# Patient Record
Sex: Female | Born: 1960
Health system: Southern US, Community
[De-identification: ages and names within clinical notes are randomized; demographics above are authoritative.]

## PROBLEM LIST (undated history)

## (undated) DIAGNOSIS — R112 Nausea with vomiting, unspecified: Secondary | ICD-10-CM

## (undated) DIAGNOSIS — H269 Unspecified cataract: Secondary | ICD-10-CM

## (undated) DIAGNOSIS — Z923 Personal history of irradiation: Secondary | ICD-10-CM

## (undated) DIAGNOSIS — I10 Essential (primary) hypertension: Secondary | ICD-10-CM

## (undated) DIAGNOSIS — K529 Noninfective gastroenteritis and colitis, unspecified: Secondary | ICD-10-CM

## (undated) DIAGNOSIS — M797 Fibromyalgia: Secondary | ICD-10-CM

## (undated) DIAGNOSIS — Z9889 Other specified postprocedural states: Secondary | ICD-10-CM

## (undated) DIAGNOSIS — D352 Benign neoplasm of pituitary gland: Secondary | ICD-10-CM

## (undated) DIAGNOSIS — F419 Anxiety disorder, unspecified: Secondary | ICD-10-CM

## (undated) DIAGNOSIS — D320 Benign neoplasm of cerebral meninges: Secondary | ICD-10-CM

## (undated) DIAGNOSIS — G9332 Myalgic encephalomyelitis/chronic fatigue syndrome: Secondary | ICD-10-CM

## (undated) DIAGNOSIS — K90829 Short bowel syndrome, unspecified: Secondary | ICD-10-CM

## (undated) DIAGNOSIS — E042 Nontoxic multinodular goiter: Secondary | ICD-10-CM

## (undated) DIAGNOSIS — G43909 Migraine, unspecified, not intractable, without status migrainosus: Secondary | ICD-10-CM

## (undated) DIAGNOSIS — C801 Malignant (primary) neoplasm, unspecified: Secondary | ICD-10-CM

## (undated) DIAGNOSIS — M1990 Unspecified osteoarthritis, unspecified site: Secondary | ICD-10-CM

## (undated) DIAGNOSIS — R5382 Chronic fatigue, unspecified: Secondary | ICD-10-CM

## (undated) DIAGNOSIS — T7840XA Allergy, unspecified, initial encounter: Secondary | ICD-10-CM

## (undated) DIAGNOSIS — E785 Hyperlipidemia, unspecified: Secondary | ICD-10-CM

## (undated) DIAGNOSIS — D689 Coagulation defect, unspecified: Secondary | ICD-10-CM

## (undated) DIAGNOSIS — F32A Depression, unspecified: Secondary | ICD-10-CM

## (undated) DIAGNOSIS — Z9289 Personal history of other medical treatment: Secondary | ICD-10-CM

## (undated) DIAGNOSIS — E119 Type 2 diabetes mellitus without complications: Secondary | ICD-10-CM

## (undated) DIAGNOSIS — R42 Dizziness and giddiness: Secondary | ICD-10-CM

## (undated) DIAGNOSIS — K219 Gastro-esophageal reflux disease without esophagitis: Secondary | ICD-10-CM

## (undated) DIAGNOSIS — M549 Dorsalgia, unspecified: Secondary | ICD-10-CM

## (undated) DIAGNOSIS — K754 Autoimmune hepatitis: Secondary | ICD-10-CM

## (undated) HISTORY — PX: FRACTURE SURGERY: SHX138

## (undated) HISTORY — DX: Anxiety disorder, unspecified: F41.9

## (undated) HISTORY — PX: TONGUE BIOPSY: SHX1075

## (undated) HISTORY — PX: ARTHROPLASTY: SHX135

## (undated) HISTORY — DX: Hyperlipidemia, unspecified: E78.5

## (undated) HISTORY — PX: EYE SURGERY: SHX253

## (undated) HISTORY — PX: ABDOMINAL HYSTERECTOMY: SHX81

## (undated) HISTORY — PX: SPINAL FUSION: SHX223

## (undated) HISTORY — PX: COLON SURGERY: SHX602

## (undated) HISTORY — PX: KNEE ARTHROSCOPY: SUR90

## (undated) HISTORY — PX: OTHER SURGICAL HISTORY: SHX169

## (undated) HISTORY — PX: APPENDECTOMY: SHX54

## (undated) HISTORY — PX: TARSAL TUNNEL RELEASE: SUR1099

## (undated) HISTORY — PX: ACHILLES TENDON REPAIR: SUR1153

## (undated) HISTORY — DX: Coagulation defect, unspecified: D68.9

## (undated) HISTORY — DX: Depression, unspecified: F32.A

## (undated) HISTORY — PX: JOINT REPLACEMENT: SHX530

## (undated) HISTORY — DX: Unspecified cataract: H26.9

## (undated) HISTORY — PX: SMALL INTESTINE SURGERY: SHX150

## (undated) HISTORY — DX: Allergy, unspecified, initial encounter: T78.40XA

## (undated) HISTORY — PX: BIOPSY THYROID: PRO38

## (undated) HISTORY — PX: CARPAL TUNNEL RELEASE: SHX101

---

## 1994-07-24 HISTORY — PX: CHOLECYSTECTOMY: SHX55

## 2002-10-24 HISTORY — PX: TOTAL ABDOMINAL HYSTERECTOMY: SHX209

## 2003-10-25 HISTORY — PX: ABDOMINAL SURGERY: SHX537

## 2013-09-23 DIAGNOSIS — G9332 Myalgic encephalomyelitis/chronic fatigue syndrome: Secondary | ICD-10-CM | POA: Insufficient documentation

## 2013-09-23 DIAGNOSIS — M069 Rheumatoid arthritis, unspecified: Secondary | ICD-10-CM | POA: Insufficient documentation

## 2013-09-23 DIAGNOSIS — K219 Gastro-esophageal reflux disease without esophagitis: Secondary | ICD-10-CM | POA: Insufficient documentation

## 2013-09-23 DIAGNOSIS — Z8619 Personal history of other infectious and parasitic diseases: Secondary | ICD-10-CM | POA: Insufficient documentation

## 2014-10-24 ENCOUNTER — Emergency Department (HOSPITAL_COMMUNITY): Payer: Medicare Other

## 2014-10-24 ENCOUNTER — Emergency Department (HOSPITAL_COMMUNITY)
Admission: EM | Admit: 2014-10-24 | Discharge: 2014-10-25 | Disposition: A | Discharge status: Home / Self Care | Payer: Medicare Other | Attending: Emergency Medicine | Admitting: Emergency Medicine

## 2014-10-24 ENCOUNTER — Encounter (HOSPITAL_COMMUNITY): Payer: Self-pay | Admitting: Emergency Medicine

## 2014-10-24 DIAGNOSIS — I951 Orthostatic hypotension: Secondary | ICD-10-CM

## 2014-10-24 DIAGNOSIS — Z8509 Personal history of malignant neoplasm of other digestive organs: Secondary | ICD-10-CM | POA: Insufficient documentation

## 2014-10-24 DIAGNOSIS — Z8739 Personal history of other diseases of the musculoskeletal system and connective tissue: Secondary | ICD-10-CM | POA: Diagnosis not present

## 2014-10-24 DIAGNOSIS — Z9049 Acquired absence of other specified parts of digestive tract: Secondary | ICD-10-CM | POA: Diagnosis not present

## 2014-10-24 DIAGNOSIS — R110 Nausea: Secondary | ICD-10-CM | POA: Insufficient documentation

## 2014-10-24 DIAGNOSIS — E119 Type 2 diabetes mellitus without complications: Secondary | ICD-10-CM | POA: Insufficient documentation

## 2014-10-24 DIAGNOSIS — R109 Unspecified abdominal pain: Secondary | ICD-10-CM

## 2014-10-24 DIAGNOSIS — R55 Syncope and collapse: Secondary | ICD-10-CM | POA: Insufficient documentation

## 2014-10-24 DIAGNOSIS — I10 Essential (primary) hypertension: Secondary | ICD-10-CM | POA: Insufficient documentation

## 2014-10-24 DIAGNOSIS — R1013 Epigastric pain: Secondary | ICD-10-CM | POA: Insufficient documentation

## 2014-10-24 HISTORY — DX: Chronic fatigue, unspecified: R53.82

## 2014-10-24 HISTORY — DX: Essential (primary) hypertension: I10

## 2014-10-24 HISTORY — DX: Unspecified osteoarthritis, unspecified site: M19.90

## 2014-10-24 HISTORY — DX: Type 2 diabetes mellitus without complications: E11.9

## 2014-10-24 HISTORY — DX: Fibromyalgia: M79.7

## 2014-10-24 HISTORY — DX: Dorsalgia, unspecified: M54.9

## 2014-10-24 HISTORY — DX: Malignant (primary) neoplasm, unspecified: C80.1

## 2014-10-24 HISTORY — DX: Myalgic encephalomyelitis/chronic fatigue syndrome: G93.32

## 2014-10-24 LAB — CBC WITH DIFFERENTIAL/PLATELET
Basophils Absolute: 0 10*3/uL (ref 0.0–0.1)
Basophils Relative: 0 % (ref 0–1)
Eosinophils Absolute: 0.2 10*3/uL (ref 0.0–0.7)
Eosinophils Relative: 2 % (ref 0–5)
HCT: 44 % (ref 36.0–46.0)
Hemoglobin: 13.8 g/dL (ref 12.0–15.0)
Lymphocytes Relative: 33 % (ref 12–46)
Lymphs Abs: 3.4 10*3/uL (ref 0.7–4.0)
MCH: 28.2 pg (ref 26.0–34.0)
MCHC: 31.4 g/dL (ref 30.0–36.0)
MCV: 90 fL (ref 78.0–100.0)
Monocytes Absolute: 1.1 10*3/uL — ABNORMAL HIGH (ref 0.1–1.0)
Monocytes Relative: 11 % (ref 3–12)
Neutro Abs: 5.6 10*3/uL (ref 1.7–7.7)
Neutrophils Relative %: 54 % (ref 43–77)
Platelets: 440 10*3/uL — ABNORMAL HIGH (ref 150–400)
RBC: 4.89 MIL/uL (ref 3.87–5.11)
RDW: 13.7 % (ref 11.5–15.5)
WBC: 10.4 10*3/uL (ref 4.0–10.5)

## 2014-10-24 LAB — COMPREHENSIVE METABOLIC PANEL
ALT: 28 U/L (ref 0–35)
AST: 29 U/L (ref 0–37)
Albumin: 4.2 g/dL (ref 3.5–5.2)
Alkaline Phosphatase: 124 U/L — ABNORMAL HIGH (ref 39–117)
Anion gap: 12 (ref 5–15)
BUN: 29 mg/dL — ABNORMAL HIGH (ref 6–23)
CO2: 25 mmol/L (ref 19–32)
Calcium: 9.3 mg/dL (ref 8.4–10.5)
Chloride: 102 mEq/L (ref 96–112)
Creatinine, Ser: 0.94 mg/dL (ref 0.50–1.10)
GFR calc Af Amer: 80 mL/min — ABNORMAL LOW (ref >90)
GFR calc non Af Amer: 69 mL/min — ABNORMAL LOW (ref >90)
Glucose, Bld: 69 mg/dL — ABNORMAL LOW (ref 70–99)
Potassium: 3.9 mmol/L (ref 3.5–5.1)
Sodium: 139 mmol/L (ref 135–145)
Total Bilirubin: 0.4 mg/dL (ref 0.3–1.2)
Total Protein: 7.7 g/dL (ref 6.0–8.3)

## 2014-10-24 LAB — URINE MICROSCOPIC-ADD ON

## 2014-10-24 LAB — I-STAT TROPONIN, ED: Troponin i, poc: 0 ng/mL (ref 0.00–0.08)

## 2014-10-24 LAB — URINALYSIS, ROUTINE W REFLEX MICROSCOPIC
Bilirubin Urine: NEGATIVE
Glucose, UA: NEGATIVE mg/dL
Hgb urine dipstick: NEGATIVE
Ketones, ur: NEGATIVE mg/dL
Nitrite: NEGATIVE
Protein, ur: NEGATIVE mg/dL
Specific Gravity, Urine: 1.014 (ref 1.005–1.030)
Urobilinogen, UA: 0.2 mg/dL (ref 0.0–1.0)
pH: 6 (ref 5.0–8.0)

## 2014-10-24 LAB — LIPASE, BLOOD: Lipase: 25 U/L (ref 11–59)

## 2014-10-24 MED ORDER — ONDANSETRON HCL 4 MG/2ML IJ SOLN
4.0000 mg | Freq: Once | INTRAMUSCULAR | Status: AC
  Administered 2014-10-24: 4 mg via INTRAVENOUS
  Filled 2014-10-24: qty 2

## 2014-10-24 MED ORDER — IOHEXOL 300 MG/ML  SOLN
50.0000 mL | Freq: Once | INTRAMUSCULAR | Status: AC | PRN
  Administered 2014-10-24: 50 mL via ORAL

## 2014-10-24 MED ORDER — IOHEXOL 300 MG/ML  SOLN
100.0000 mL | Freq: Once | INTRAMUSCULAR | Status: AC | PRN
  Administered 2014-10-24: 100 mL via INTRAVENOUS

## 2014-10-24 MED ORDER — HYDROMORPHONE HCL 1 MG/ML IJ SOLN
1.0000 mg | Freq: Once | INTRAMUSCULAR | Status: AC
Start: 2014-10-24 — End: 2014-10-24
  Administered 2014-10-24: 1 mg via INTRAVENOUS
  Filled 2014-10-24: qty 1

## 2014-10-24 MED ORDER — ASPIRIN 81 MG PO CHEW
324.0000 mg | CHEWABLE_TABLET | Freq: Once | ORAL | Status: AC
  Administered 2014-10-24: 324 mg via ORAL
  Filled 2014-10-24: qty 4

## 2014-10-24 MED ORDER — HYDROMORPHONE HCL 1 MG/ML IJ SOLN
1.0000 mg | Freq: Once | INTRAMUSCULAR | Status: AC
  Administered 2014-10-24: 1 mg via INTRAVENOUS
  Filled 2014-10-24: qty 1

## 2014-10-24 MED ORDER — FAMOTIDINE IN NACL 20-0.9 MG/50ML-% IV SOLN
20.0000 mg | Freq: Once | INTRAVENOUS | Status: AC
  Administered 2014-10-24: 20 mg via INTRAVENOUS
  Filled 2014-10-24: qty 50

## 2014-10-24 MED ORDER — SODIUM CHLORIDE 0.9 % IV BOLUS (SEPSIS)
1000.0000 mL | Freq: Once | INTRAVENOUS | Status: AC
  Administered 2014-10-24: 1000 mL via INTRAVENOUS

## 2014-10-24 MED ORDER — MORPHINE SULFATE 4 MG/ML IJ SOLN
4.0000 mg | Freq: Once | INTRAMUSCULAR | Status: DC
  Filled 2014-10-24: qty 1

## 2014-10-24 MED ORDER — GI COCKTAIL ~~LOC~~
30.0000 mL | Freq: Once | ORAL | Status: AC
  Administered 2014-10-24: 30 mL via ORAL
  Filled 2014-10-24: qty 30

## 2014-10-24 NOTE — ED Notes (Signed)
Pt states that she passed out yesterday and she has been feeling weak and dizzy.  Nausea and gen abd pain.

## 2014-10-24 NOTE — ED Notes (Signed)
Patient transported to CT 

## 2014-10-24 NOTE — ED Provider Notes (Signed)
CSN: [id number]     Arrival date & time 10/24/14  1602 History   First MD Initiated Contact with Patient 10/24/14 1852     Chief Complaint  Patient presents with  . Abdominal Pain     (Consider location/radiation/quality/duration/timing/severity/associated sxs/prior Treatment) HPI Comments: The patient is a 54 year old female with a past medical history of appendiceal cancer s/p surgery and HIPEC, fibromyalgia, cholectomy, partial small and large bowel resection, hysterectomy, presenting to emergency room chief complaint of abdominal discomfort for 2 days. Patient initially reports onset of symptoms approximately 5 days ago, dull, constant, resolved.  Patient reports return of symptoms for 2 days. With nausea without emesis. Patient reports normal bowel movements, last BM today while in ED. Denies dysuria, hematuria. Patient denies fever.  Reports initiating statin therapy and losartan this week. Reports lightheadedness upon standing, with one syncopal event. Also reports belching, with malodorous taste. Patient reports lightheadedness upon standing with 1 syncopal episode, witnessed yesterday. Patient denies headache, dizziness, visual changes. Recently started losartan 50 mg this week blood pressure 140/90 prior to staring this medication. [place]    Patient is a 54 y.o. female presenting with abdominal pain. The history is provided by the patient. No language interpreter was used.  Abdominal Pain Associated symptoms: nausea   Associated symptoms: no chills, no constipation, no diarrhea, no dysuria, no fever, no hematuria and no vomiting     Past Medical History  Diagnosis Date  . Fibromyalgia   . Arthritis   . Cancer     pseudomyxoma peritonei  . Back pain   . Hypertension   . Chronic fatigue syndrome   . Diabetes mellitus without complication    Past Surgical History  Procedure Laterality Date  . Perineorrophy    . Carpal tunnel release    . Knee arthroscopy    .  Cholecystectomy    . Abdominal hysterectomy    . Appendectomy    . Abdominal surgery    . Joint replacement    . Achilles tendon repair    . Arthroplasty    . Tongue biopsy     No family history on file. History  Substance Use Topics  . Smoking status: Never Smoker   . Smokeless tobacco: Not on file  . Alcohol Use: No   OB History    No data available     Review of Systems  Constitutional: Negative for fever and chills.  Eyes: Negative for visual disturbance.  Gastrointestinal: Positive for nausea and abdominal pain. Negative for vomiting, diarrhea, constipation, blood in stool and anal bleeding.  Genitourinary: Negative for dysuria, urgency and hematuria.  Neurological: Positive for syncope and light-headedness. Negative for numbness.      Allergies  Review of patient's allergies indicates not on file.  Home Medications   Prior to Admission medications   Not on File   BP 123/66 mmHg  Pulse 103  Temp(Src) 98.4 F (36.9 C) (Oral)  Resp 20  SpO2 100% Physical Exam  Constitutional: She is oriented to person, place, and time. She appears well-developed and well-nourished. No distress.  HENT:  Head: Normocephalic and atraumatic.  Eyes: EOM are normal.  Neck: Neck supple.  Cardiovascular: Normal rate and regular rhythm.   Pulmonary/Chest: Effort normal and breath sounds normal. No respiratory distress. She has no wheezes. She has no rales.  Abdominal: Soft. There is tenderness in the epigastric area. There is no rebound and no guarding.  Obese abdomen, well healed scars.  Neurological: She is alert  and oriented to person, place, and time.  Skin: Skin is warm and dry.  Psychiatric: She has a normal mood and affect.  Nursing note and vitals reviewed.   ED Course  Procedures (including critical care time) Labs Review Labs Reviewed  CBC WITH DIFFERENTIAL - Abnormal; Notable for the following:    Platelets 440 (*)    Monocytes Absolute 1.1 (*)    All other  components within normal limits  COMPREHENSIVE METABOLIC PANEL - Abnormal; Notable for the following:    Glucose, Bld 69 (*)    BUN 29 (*)    Alkaline Phosphatase 124 (*)    GFR calc non Af Amer 69 (*)    GFR calc Af Amer 80 (*)    All other components within normal limits  URINALYSIS, ROUTINE W REFLEX MICROSCOPIC - Abnormal; Notable for the following:    Leukocytes, UA TRACE (*)    All other components within normal limits  LIPASE, BLOOD  URINE MICROSCOPIC-ADD ON  I-STAT TROPOININ, ED    Imaging Review Ct Abdomen Pelvis W Contrast  10/24/2014   CLINICAL DATA:  Diffuse abdominal pain, nausea and vomiting for 1 day. Weakness. History of pseudomyxoma peritonei. Previous appendectomy.  EXAM: CT ABDOMEN AND PELVIS WITH CONTRAST  TECHNIQUE: Multidetector CT imaging of the abdomen and pelvis was performed using the standard protocol following bolus administration of intravenous contrast.  CONTRAST:  42mL OMNIPAQUE IOHEXOL 300 MG/ML SOLN, 131mL OMNIPAQUE IOHEXOL 300 MG/ML SOLN  COMPARISON:  None.  FINDINGS: Focal scarring in the right lung base. Mild dependent atelectasis in both lung bases.  Surgical absence of the gallbladder. Surgical clips in the gastric hepatic ligament and upper abdomen. Patchy low-attenuation in the liver may represent fatty infiltration. No focal liver lesions suggested. No bile duct dilatation. Surgical absence of the spleen. The pancreas, adrenal glands, kidneys, abdominal aorta, and retroperitoneal lymph nodes are unremarkable. Inferior vena caval filter. Stomach, small bowel, and colon are not abnormally distended. The no bowel wall thickening is appreciated. Tiny ventral abdominal hernia containing partial wall of the transverse colon without evidence of proximal obstruction. Mesenteric lymph nodes are not abnormally enlarged. No significant ascites or peritoneal nodularity is identified. No free air.  Pelvis: Surgical anastomosis at the rectosigmoid junction. Uterus is  surgically absent. No pelvic mass or lymphadenopathy. No free or loculated pelvic fluid collections. Bladder wall is not thickened. Lumbar postoperative changes with L4-5 laminectomy and posterior fixation. Degenerative changes throughout the lumbar spine. Mild lumbar scoliosis. No destructive bone lesions.  IMPRESSION: No acute process demonstrated in the abdomen or pelvis. Multiple abdominal surgeries. Probable focal fatty infiltration in the liver.   Electronically Signed   By: Lucienne Capers M.D.   On: 10/24/2014 21:25     EKG Interpretation   Date/Time:  Friday October 24 2014 22:07:28 EST Ventricular Rate:  83 PR Interval:  172 QRS Duration: 99 QT Interval:  429 QTC Calculation: 504 R Axis:   -31 Text Interpretation:  Sinus rhythm Low voltage, precordial leads  Nonspecific T wave abnormality No previous tracing Confirmed by Ashok Cordia   MD, Lennette Bihari ([id number]) on 10/24/2014 10:16:59 PM      MDM   Final diagnoses:  Abdominal pain  Orthostatic syncope   Patient presents with epigastric discomfort, history of appendiceal cancer status post resection and HIPEC procedure. Labs without concerning abnormalities. Patient also complains of syncope and lightheadedness likely secondary to orthostasis due addition of Cozaar. [id number] Reevaluation patient resting complain room. Discussed CT results with the patient and positive  orthostatics. Given history, persistent symptoms, orthostatics plan to evaluate for ACS. EKG with nonspecific abnormalities, negative troponin. Reevaluation patient reports mild to moderate resolution of abdominal discomfort after medication. Discussed lab results, imaging results, and treatment plan with the patient. Will continue to rehydrate the patient given orthostatic hypotension. Discontinuation of losartan. Patient disposed to be set by [place], PA-C at shift change, awaiting completion of fluid bolus, re-evaluation.  Likely discharge home with follow up.  Meds given in  ED:  Medications  ondansetron [facility]) injection 4 mg (4 mg Intravenous Given 10/24/14 1927)  HYDROmorphone (DILAUDID) injection 1 mg (1 mg Intravenous Given 10/24/14 1927)  sodium chloride 0.9 % bolus 1,000 mL (1,000 mLs Intravenous New Bag/Given 10/24/14 1927)    New Prescriptions   No medications on file       Harvie Heck, PA-C 10/26/14 [place], MD 10/28/14 [phone]

## 2014-10-25 DIAGNOSIS — R1013 Epigastric pain: Secondary | ICD-10-CM | POA: Diagnosis not present

## 2014-10-25 MED ORDER — ONDANSETRON 4 MG PO TBDP: 4.0000 mg | ORAL_TABLET | Freq: Three times a day (TID) | ORAL | Status: DC | PRN

## 2014-10-25 MED ORDER — PROMETHAZINE HCL 12.5 MG PO TABS: 12.5000 mg | ORAL_TABLET | Freq: Four times a day (QID) | ORAL | Status: DC | PRN

## 2014-10-25 MED ORDER — PROMETHAZINE HCL 25 MG/ML IJ SOLN
12.5000 mg | Freq: Once | INTRAMUSCULAR | Status: AC
  Administered 2014-10-25: 12.5 mg via INTRAVENOUS
  Filled 2014-10-25: qty 1

## 2014-10-25 NOTE — ED Provider Notes (Signed)
Patient orthostatic - getting fluids Syncope yesterday  Reassess after fluids. Patient prefers discharge.   She reports to the nurse she feels better after fluids and wants to go home. Discharge prepared by Harvie Heck, PA-C, and disposition set for discharge.   Dewaine Oats, PA-C 10/28/14 2306  Wynetta Fines, MD 10/29/14 1243

## 2014-10-25 NOTE — Discharge Instructions (Signed)
Stopped taking your losartan/Cozaar. Call for a follow up appointment with a Family or Primary Care Provider.  Call your gastroenterologist specialist for further evaluation of your abdominal discomfort. Return if Symptoms worsen.   Take medication as prescribed.

## 2014-10-25 NOTE — ED Notes (Signed)
Pt able to stand up with no symptoms.  Pt feels ready to go home.

## 2015-12-01 IMAGING — CT CT ABD-PELV W/ CM
1 of 2 series · 15 of 32 positions shown, 19 images · IV contrast (OMNIPAQUE 300)
Comparison: None.

CLINICAL DATA: Diffuse abdominal pain, nausea and vomiting for 1
day. Weakness. History of pseudomyxoma peritonei. Previous
appendectomy.

EXAM:
CT ABDOMEN AND PELVIS WITH CONTRAST
TECHNIQUE: Multidetector CT imaging of the abdomen and pelvis was performed
using the standard protocol following bolus administration of
intravenous contrast.
CONTRAST:  50mL OMNIPAQUE IOHEXOL 300 MG/ML SOLN, 100mL OMNIPAQUE
IOHEXOL 300 MG/ML SOLN

[Series 2: abd/pel with · axial · 0.74mm/px · z∈[+1118,+1558]mm · 15 of 96 slices shown, 19 images]
[im 4/96  soft-tissue]
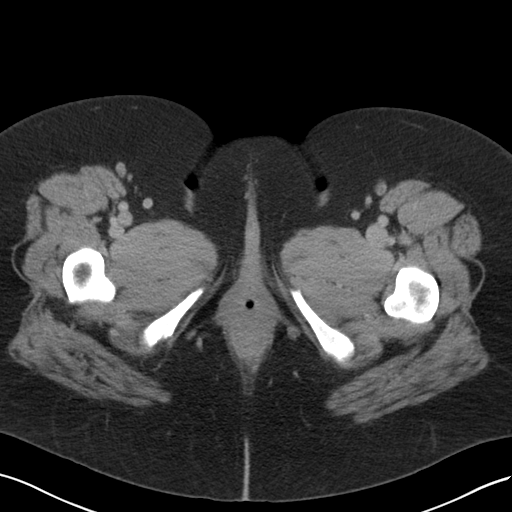
[im 4/96  bone]
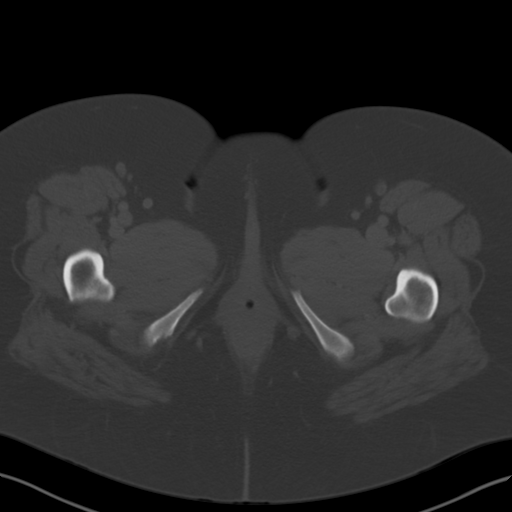
[im 12/96  soft-tissue]
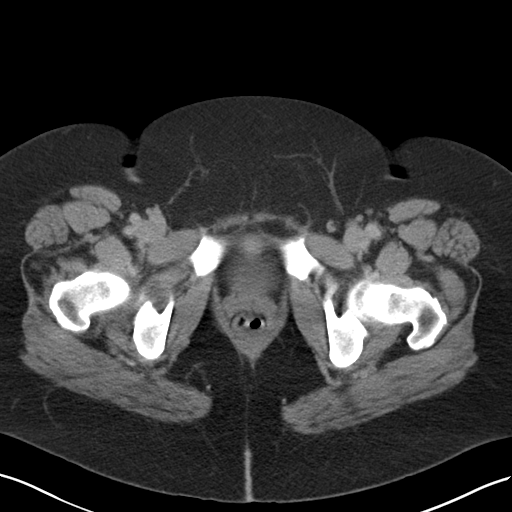
[im 20/96  soft-tissue]
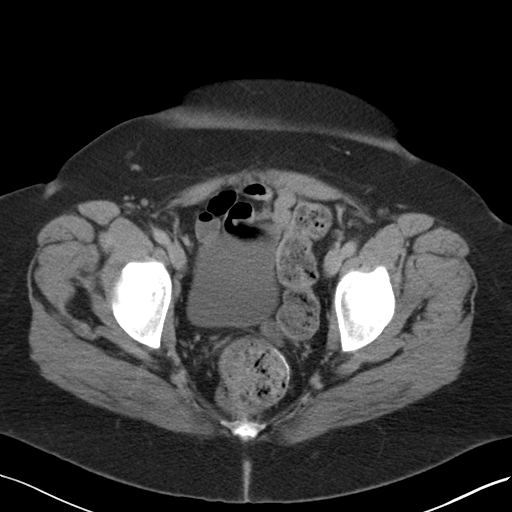
[im 28/96  soft-tissue]
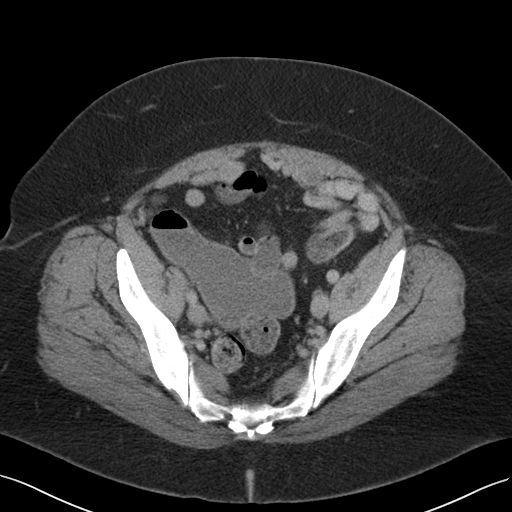
[im 32/96  soft-tissue]
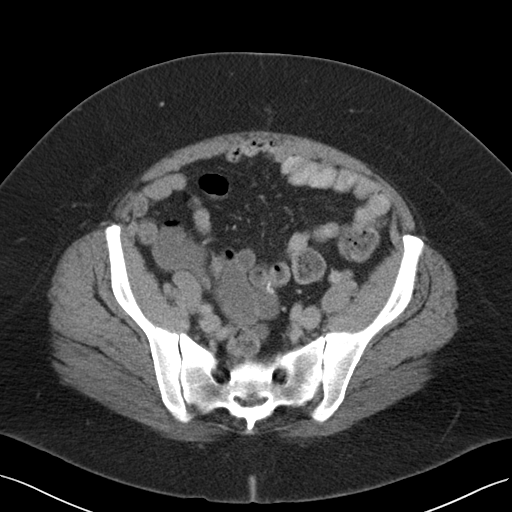
[im 40/96  soft-tissue]
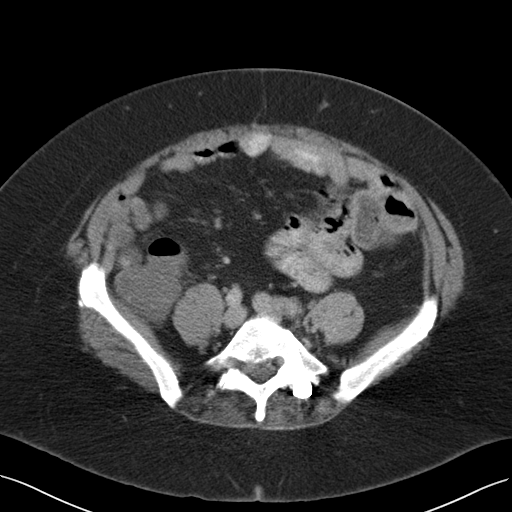
[im 48/96  soft-tissue]
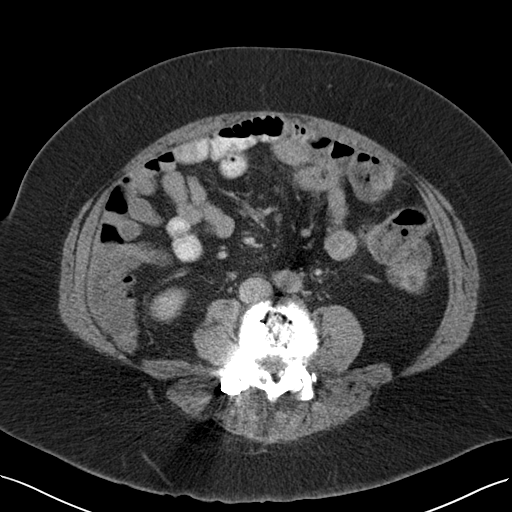
[im 56/96  soft-tissue]
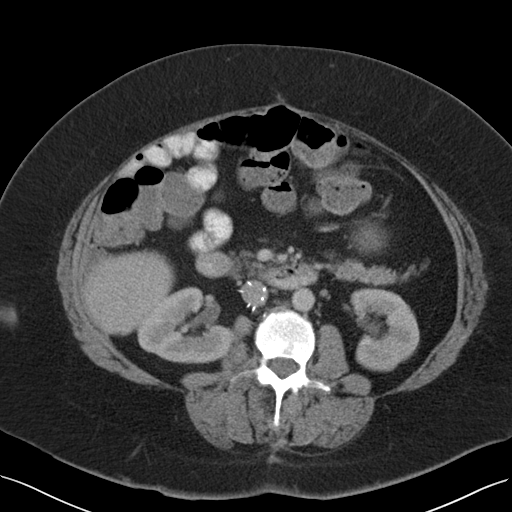
[im 64/96  soft-tissue]
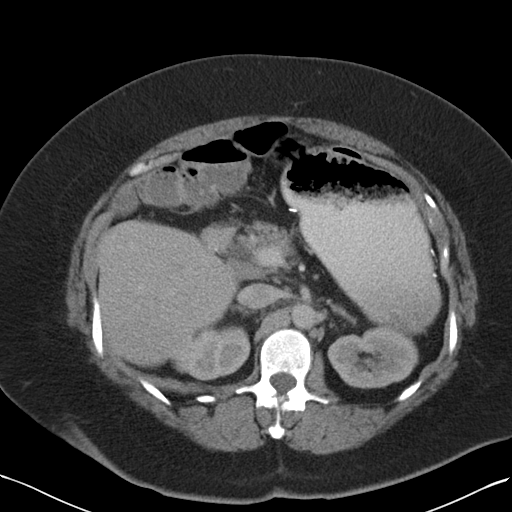
[im 64/96  bone]
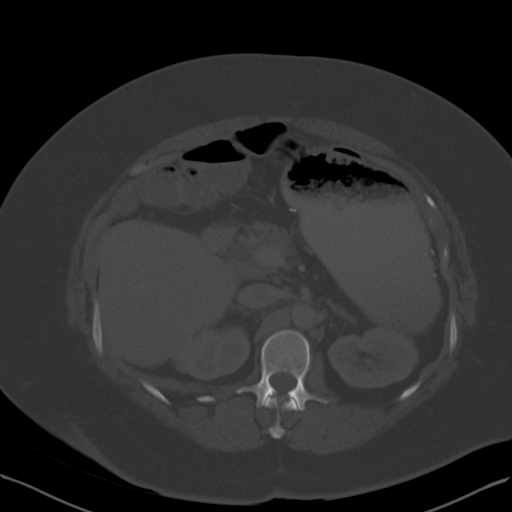
[im 68/96  soft-tissue]
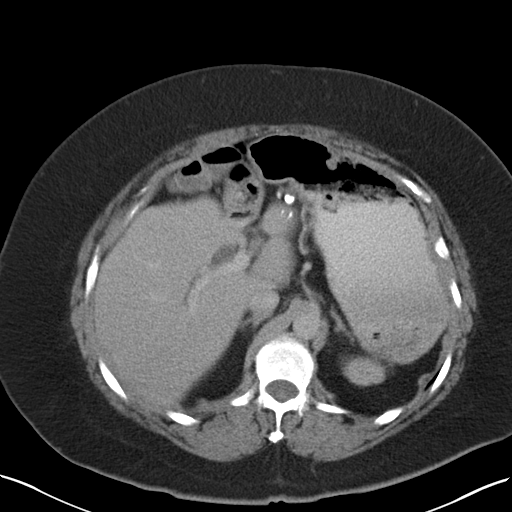
[im 76/96  soft-tissue]
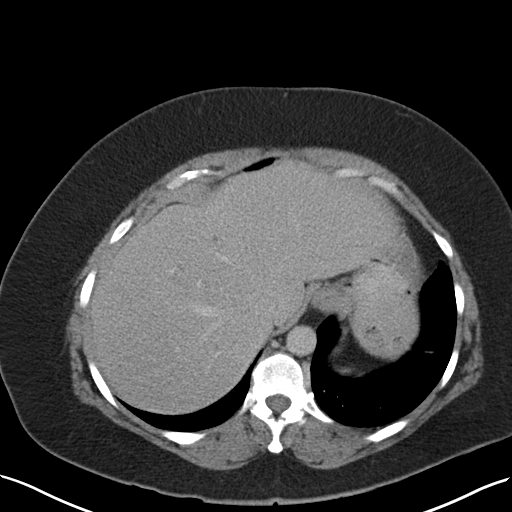
[im 80/96  lung]
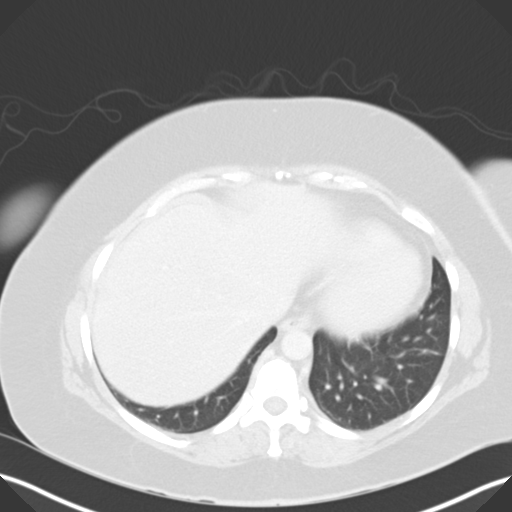
[im 84/96  soft-tissue]
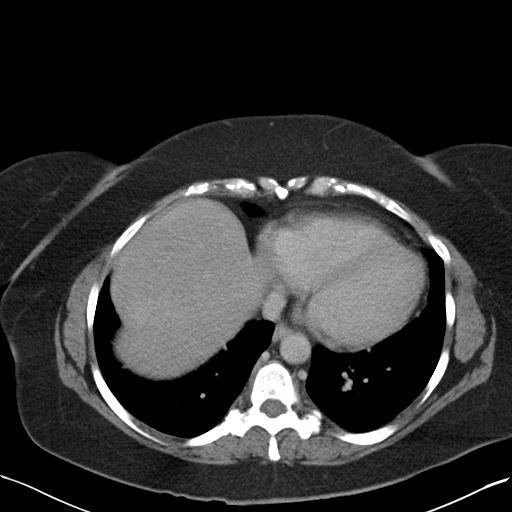
[im 84/96  lung]
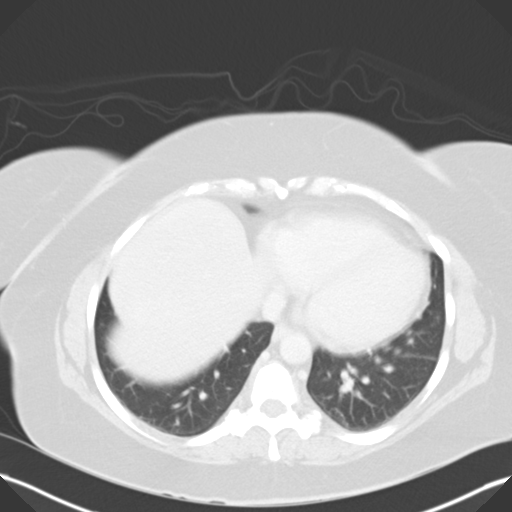
[im 88/96  lung]
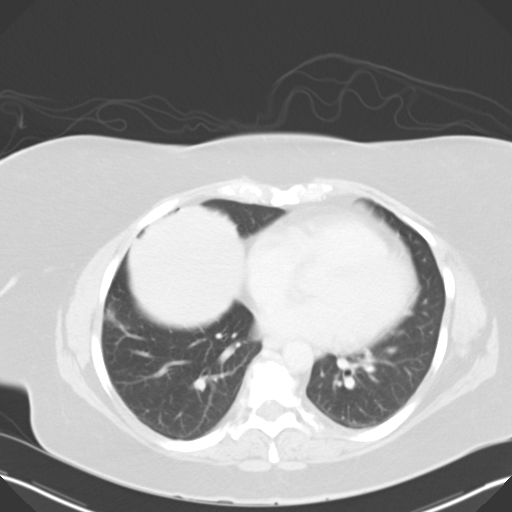
[im 92/96  soft-tissue]
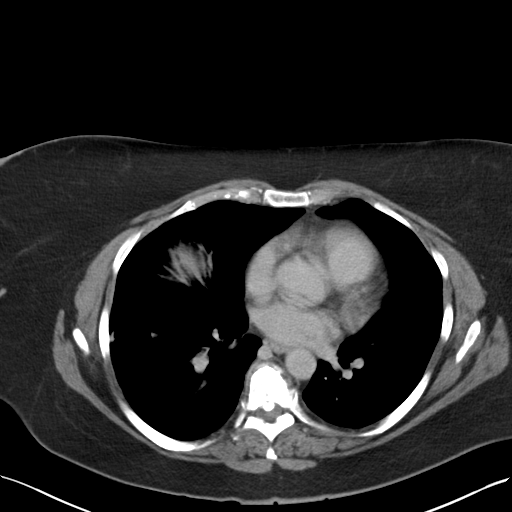
[im 92/96  lung]
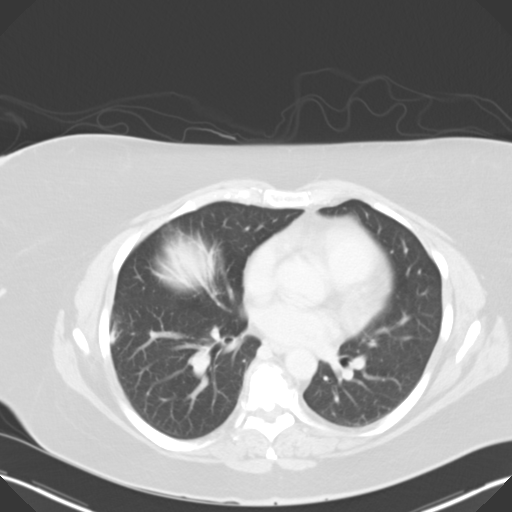

[15 of 32 positions shown; findings below may reference images not displayed]

FINDINGS: Focal scarring in the right lung base. Mild dependent atelectasis in
both lung bases.

Surgical absence of the gallbladder. Surgical clips in the gastric
hepatic ligament and upper abdomen. Patchy low-attenuation in the
liver may represent fatty infiltration. No focal liver lesions
suggested. No bile duct dilatation. Surgical absence of the spleen.
The pancreas, adrenal glands, kidneys, abdominal aorta, and
retroperitoneal lymph nodes are unremarkable. Inferior vena caval
filter. Stomach, small bowel, and colon are not abnormally
distended. The no bowel wall thickening is appreciated. Tiny ventral
abdominal hernia containing partial wall of the transverse colon
without evidence of proximal obstruction. Mesenteric lymph nodes are
not abnormally enlarged. No significant ascites or peritoneal
nodularity is identified. No free air.

Pelvis: Surgical anastomosis at the rectosigmoid junction. Uterus is
surgically absent. No pelvic mass or lymphadenopathy. No free or
loculated pelvic fluid collections. Bladder wall is not thickened.
Lumbar postoperative changes with L4-5 laminectomy and posterior
fixation. Degenerative changes throughout the lumbar spine. Mild
lumbar scoliosis. No destructive bone lesions.
IMPRESSION: No acute process demonstrated in the abdomen or pelvis. Multiple
abdominal surgeries. Probable focal fatty infiltration in the liver.

## 2016-01-07 DIAGNOSIS — G43109 Migraine with aura, not intractable, without status migrainosus: Secondary | ICD-10-CM | POA: Insufficient documentation

## 2016-02-14 DIAGNOSIS — M545 Low back pain, unspecified: Secondary | ICD-10-CM | POA: Insufficient documentation

## 2016-02-14 DIAGNOSIS — F32A Depression, unspecified: Secondary | ICD-10-CM | POA: Insufficient documentation

## 2016-02-14 DIAGNOSIS — Z87442 Personal history of urinary calculi: Secondary | ICD-10-CM | POA: Insufficient documentation

## 2017-02-18 DIAGNOSIS — M19011 Primary osteoarthritis, right shoulder: Secondary | ICD-10-CM | POA: Insufficient documentation

## 2018-01-05 DIAGNOSIS — D320 Benign neoplasm of cerebral meninges: Secondary | ICD-10-CM | POA: Insufficient documentation

## 2018-08-03 DIAGNOSIS — I82412 Acute embolism and thrombosis of left femoral vein: Secondary | ICD-10-CM | POA: Insufficient documentation

## 2018-08-03 DIAGNOSIS — Z9049 Acquired absence of other specified parts of digestive tract: Secondary | ICD-10-CM | POA: Insufficient documentation

## 2018-08-03 DIAGNOSIS — M4800 Spinal stenosis, site unspecified: Secondary | ICD-10-CM | POA: Insufficient documentation

## 2018-08-03 DIAGNOSIS — Z85038 Personal history of other malignant neoplasm of large intestine: Secondary | ICD-10-CM | POA: Insufficient documentation

## 2019-07-12 ENCOUNTER — Other Ambulatory Visit (HOSPITAL_COMMUNITY): Payer: Self-pay | Admitting: Gastroenterology

## 2019-07-12 ENCOUNTER — Other Ambulatory Visit: Payer: Self-pay | Admitting: Gastroenterology

## 2019-07-12 DIAGNOSIS — R110 Nausea: Secondary | ICD-10-CM

## 2019-07-22 ENCOUNTER — Ambulatory Visit (HOSPITAL_COMMUNITY)

## 2019-07-31 ENCOUNTER — Encounter (HOSPITAL_COMMUNITY)

## 2019-08-20 ENCOUNTER — Other Ambulatory Visit: Payer: Self-pay

## 2019-08-20 ENCOUNTER — Encounter (HOSPITAL_COMMUNITY)
Admission: RE | Admit: 2019-08-20 | Discharge: 2019-08-20 | Disposition: A | Discharge status: Home / Self Care | Payer: TRICARE For Life (TFL) | Source: Ambulatory Visit | Attending: Gastroenterology | Admitting: Gastroenterology

## 2019-08-20 DIAGNOSIS — R110 Nausea: Secondary | ICD-10-CM

## 2019-08-20 MED ORDER — TECHNETIUM TC 99M SULFUR COLLOID
2.0100 | Freq: Once | INTRAVENOUS | Status: AC | PRN
  Administered 2019-08-20: 2.01 via ORAL

## 2019-08-20 MED ORDER — TECHNETIUM TC 99M SULFUR COLLOID: 2.0100 | Freq: Once | INTRAVENOUS | Status: DC | PRN

## 2019-08-22 ENCOUNTER — Emergency Department (HOSPITAL_BASED_OUTPATIENT_CLINIC_OR_DEPARTMENT_OTHER): Payer: TRICARE For Life (TFL)

## 2019-08-22 ENCOUNTER — Other Ambulatory Visit: Payer: Self-pay

## 2019-08-22 ENCOUNTER — Emergency Department (HOSPITAL_BASED_OUTPATIENT_CLINIC_OR_DEPARTMENT_OTHER)
Admission: EM | Admit: 2019-08-22 | Discharge: 2019-08-22 | Disposition: A | Discharge status: Home / Self Care | Payer: TRICARE For Life (TFL) | Attending: Emergency Medicine | Admitting: Emergency Medicine

## 2019-08-22 ENCOUNTER — Encounter (HOSPITAL_BASED_OUTPATIENT_CLINIC_OR_DEPARTMENT_OTHER): Payer: Self-pay

## 2019-08-22 DIAGNOSIS — W109XXA Fall (on) (from) unspecified stairs and steps, initial encounter: Secondary | ICD-10-CM | POA: Insufficient documentation

## 2019-08-22 DIAGNOSIS — S99911A Unspecified injury of right ankle, initial encounter: Secondary | ICD-10-CM | POA: Diagnosis present

## 2019-08-22 DIAGNOSIS — I10 Essential (primary) hypertension: Secondary | ICD-10-CM | POA: Diagnosis not present

## 2019-08-22 DIAGNOSIS — Y929 Unspecified place or not applicable: Secondary | ICD-10-CM | POA: Diagnosis not present

## 2019-08-22 DIAGNOSIS — Z8589 Personal history of malignant neoplasm of other organs and systems: Secondary | ICD-10-CM | POA: Insufficient documentation

## 2019-08-22 DIAGNOSIS — S82891A Other fracture of right lower leg, initial encounter for closed fracture: Secondary | ICD-10-CM

## 2019-08-22 DIAGNOSIS — S82301A Unspecified fracture of lower end of right tibia, initial encounter for closed fracture: Secondary | ICD-10-CM | POA: Diagnosis not present

## 2019-08-22 DIAGNOSIS — R55 Syncope and collapse: Secondary | ICD-10-CM | POA: Diagnosis not present

## 2019-08-22 DIAGNOSIS — Z794 Long term (current) use of insulin: Secondary | ICD-10-CM | POA: Diagnosis not present

## 2019-08-22 DIAGNOSIS — Y999 Unspecified external cause status: Secondary | ICD-10-CM | POA: Diagnosis not present

## 2019-08-22 DIAGNOSIS — Y9389 Activity, other specified: Secondary | ICD-10-CM | POA: Insufficient documentation

## 2019-08-22 DIAGNOSIS — E119 Type 2 diabetes mellitus without complications: Secondary | ICD-10-CM | POA: Diagnosis not present

## 2019-08-22 DIAGNOSIS — Z79899 Other long term (current) drug therapy: Secondary | ICD-10-CM | POA: Insufficient documentation

## 2019-08-22 LAB — CBC WITH DIFFERENTIAL/PLATELET
Abs Immature Granulocytes: 0.02 10*3/uL (ref 0.00–0.07)
Basophils Absolute: 0.1 10*3/uL (ref 0.0–0.1)
Basophils Relative: 2 %
Eosinophils Absolute: 0.3 10*3/uL (ref 0.0–0.5)
Eosinophils Relative: 3 %
HCT: 39.1 % (ref 36.0–46.0)
Hemoglobin: 12.5 g/dL (ref 12.0–15.0)
Immature Granulocytes: 0 %
Lymphocytes Relative: 30 %
Lymphs Abs: 2.9 10*3/uL (ref 0.7–4.0)
MCH: 28.4 pg (ref 26.0–34.0)
MCHC: 32 g/dL (ref 30.0–36.0)
MCV: 88.9 fL (ref 80.0–100.0)
Monocytes Absolute: 1.2 10*3/uL — ABNORMAL HIGH (ref 0.1–1.0)
Monocytes Relative: 12 %
Neutro Abs: 5.1 10*3/uL (ref 1.7–7.7)
Neutrophils Relative %: 53 %
Platelets: 378 10*3/uL (ref 150–400)
RBC: 4.4 MIL/uL (ref 3.87–5.11)
RDW: 17.2 % — ABNORMAL HIGH (ref 11.5–15.5)
WBC: 9.6 10*3/uL (ref 4.0–10.5)
nRBC: 0 % (ref 0.0–0.2)

## 2019-08-22 LAB — BASIC METABOLIC PANEL
Anion gap: 10 (ref 5–15)
BUN: 34 mg/dL — ABNORMAL HIGH (ref 6–20)
CO2: 23 mmol/L (ref 22–32)
Calcium: 9.5 mg/dL (ref 8.9–10.3)
Chloride: 103 mmol/L (ref 98–111)
Creatinine, Ser: 0.89 mg/dL (ref 0.44–1.00)
GFR calc Af Amer: >60 mL/min (ref >60)
GFR calc non Af Amer: >60 mL/min (ref >60)
Glucose, Bld: 128 mg/dL — ABNORMAL HIGH (ref 70–99)
Potassium: 3.6 mmol/L (ref 3.5–5.1)
Sodium: 136 mmol/L (ref 135–145)

## 2019-08-22 MED ORDER — FENTANYL CITRATE (PF) 100 MCG/2ML IJ SOLN
50.0000 ug | Freq: Once | INTRAMUSCULAR | Status: AC
  Administered 2019-08-22: 50 ug via INTRAVENOUS
  Filled 2019-08-22 (×2): qty 2

## 2019-08-22 MED ORDER — OXYCODONE HCL 5 MG PO TABS: 5.0000 mg | ORAL_TABLET | Freq: Four times a day (QID) | ORAL | 0 refills | Status: DC | PRN

## 2019-08-22 MED FILL — oxyCODONE HCL 5 MG TABS: 5 | 3 days supply | Qty: 15 | Fill #0

## 2019-08-22 NOTE — ED Triage Notes (Signed)
Pt arrived via [facility] from home where pt had a syncopal episode today. Pt reports history of syncope with no known cause. States that she was letting her dogs out and woke up on the ground. States "it was raining and I really was not that wet when I woke up so I couldn't have been out that long." Pt reports calling daughter who called EMS. Some chronic back pain. Injury to right ankle. Placed in blue boot with EMS.

## 2019-08-22 NOTE — ED Notes (Signed)
Attempted IV left AC, unable to obtain.

## 2019-08-22 NOTE — ED Provider Notes (Signed)
[place] EMERGENCY DEPARTMENT Provider Note   CSN: [id number] Arrival date & time: 08/22/19  1152     History   Chief Complaint Chief Complaint  Patient presents with   Loss of Consciousness   Ankle Pain    HPI Wendy Barber is a 58 y.o. female.     The history is provided by the patient.  Loss of Consciousness Episode history:  Single Most recent episode:  Today Timing:  Intermittent Progression:  Resolved Chronicity:  Recurrent Context: standing up   Relieved by:  Nothing Worsened by:  Nothing Associated symptoms: malaise/fatigue and nausea   Associated symptoms: no anxiety, no chest pain, no confusion, no diaphoresis, no difficulty breathing, no dizziness, no fever, no focal sensory loss, no focal weakness, no headaches, no palpitations, no seizures, no shortness of breath, no visual change, no vomiting and no weakness   Risk factors: no coronary artery disease and no seizures   Ankle Pain Associated symptoms: no back pain and no fever     Past Medical History:  Diagnosis Date   Arthritis    Back pain    Cancer (HCC)    pseudomyxoma peritonei   Chronic fatigue syndrome    Diabetes mellitus without complication (HCC)    Fibromyalgia    Hypertension     There are no active problems to display for this patient.   Past Surgical History:  Procedure Laterality Date   ABDOMINAL HYSTERECTOMY     ABDOMINAL SURGERY     ACHILLES TENDON REPAIR     APPENDECTOMY     ARTHROPLASTY     CARPAL TUNNEL RELEASE     CHOLECYSTECTOMY     JOINT REPLACEMENT     KNEE ARTHROSCOPY     perineorrophy     TONGUE BIOPSY       OB History   No obstetric history on file.      Home Medications    Prior to Admission medications   Medication Sig Start Date End Date Taking? Authorizing Provider  busPIRone (BUSPAR) 5 MG tablet Take 5 mg by mouth 3 (three) times daily.   Yes [provider]  empagliflozin (JARDIANCE) 25 MG TABS  tablet Take 25 mg by mouth daily.   Yes [provider]  insulin glargine (LANTUS) 100 UNIT/ML injection Inject 25 Units into the skin daily. Before bed   Yes [provider]  insulin lispro (HUMALOG) 100 UNIT/ML injection Inject 12 Units into the skin 3 (three) times daily before meals.   Yes [provider]  metFORMIN (GLUCOPHAGE) 500 MG tablet Take by mouth 2 (two) times daily with a meal.   Yes [provider]  nitrofurantoin (MACRODANTIN) 50 MG capsule Take 50 mg by mouth 4 (four) times daily.   Yes [provider]  propantheline (PROBANTHINE) 15 MG tablet Take 15 mg by mouth 3 (three) times daily with meals.   Yes [provider]  propranolol (INDERAL) 20 MG tablet Take 20 mg by mouth 3 (three) times daily.   Yes [provider]  acetaminophen (TYLENOL) 650 MG CR tablet Take 1,300 mg by mouth every 8 (eight) hours as needed for pain (arthritis pain).    [provider]  amitriptyline (ELAVIL) 10 MG tablet Take 20 mg by mouth at bedtime.    [provider]  amLODIPine-Valsartan-HCTZ 5-160-12.5 MG TABS Take by mouth.    [provider]  aspirin 81 MG tablet Take 81 mg by mouth 2 (two) times daily.    [provider]  Cholecalciferol (VITAMIN D) 2000 UNITS tablet Take 2,000 Units by mouth daily.    [provider]  Coenzyme Q10 (CO Q-10) 100 MG CAPS Take 1 capsule by mouth at bedtime.    [provider]  DULoxetine (CYMBALTA) 60 MG capsule Take 60 mg by mouth 2 (two) times daily.    [provider]  ferrous sulfate 325 (65 FE) MG EC tablet Take 325 mg by mouth daily.    [provider]  glipiZIDE (GLUCOTROL) 5 MG tablet Take 2.5 mg by mouth daily before breakfast.    [provider]  hydrochlorothiazide (HYDRODIURIL) 25 MG tablet Take 25 mg by mouth daily.    [provider]  hydroxychloroquine (PLAQUENIL) 200 MG tablet Take 400 mg by mouth at  bedtime.    [provider]  Meloxicam (MOBIC PO) Take 60 mg by mouth at bedtime.    [provider]  omeprazole (PRILOSEC) 40 MG capsule Take 40 mg by mouth at bedtime.    [provider]  ondansetron (ZOFRAN ODT) 4 MG disintegrating tablet Take 1 tablet (4 mg total) by mouth every 8 (eight) hours as needed for nausea or vomiting. 10/25/14   Harvie Heck, PA-C  oxyCODONE (ROXICODONE) 5 MG immediate release tablet Take 1 tablet (5 mg total) by mouth every 6 (six) hours as needed for up to 15 doses for severe pain. 08/22/19   Sidi Dzikowski, DO  pravastatin (PRAVACHOL) 20 MG tablet Take 20 mg by mouth at bedtime.    [provider]  pregabalin (LYRICA) 150 MG capsule Take 150-300 mg by mouth 2 (two) times daily. Take 150 mg in am & Take 300 mg in pm.    [provider]  promethazine (PHENERGAN) 12.5 MG tablet Take 1 tablet (12.5 mg total) by mouth every 6 (six) hours as needed for nausea or vomiting. 10/25/14   Harvie Heck, PA-C    Family History No family history on file.  Social History Social History   Tobacco Use   Smoking status: Never Smoker   Smokeless tobacco: Never Used  Substance Use Topics   Alcohol use: No   Drug use: Never     Allergies   Penicillins, Alprazolam, Ativan [lorazepam], Codeine, Corticosteroids, Erythromycin base, Milnacipran hcl, Prednisolone, and Prednisone   Review of Systems Review of Systems  Constitutional: Positive for malaise/fatigue. Negative for chills, diaphoresis and fever.  HENT: Negative for ear pain and sore throat.   Eyes: Negative for pain and visual disturbance.  Respiratory: Negative for cough and shortness of breath.   Cardiovascular: Positive for syncope. Negative for chest pain and palpitations.  Gastrointestinal: Positive for nausea. Negative for abdominal pain and vomiting.  Genitourinary: Negative for dysuria and hematuria.  Musculoskeletal: Positive for arthralgias (right ankle  pain ). Negative for back pain.  Skin: Negative for color change and rash.  Neurological: Negative for dizziness, focal weakness, seizures, syncope, weakness and headaches.  Psychiatric/Behavioral: Negative for confusion.  All other systems reviewed and are negative.    Physical Exam Updated Vital Signs  ED Triage Vitals  Enc Vitals Group     BP 08/22/19 1157 118/70     Pulse Rate 08/22/19 1157 94     Resp 08/22/19 1157 18     Temp 08/22/19 1157 98.1 F (36.7 C)     Temp Source 08/22/19 1157 Oral     SpO2 08/22/19 1154 95 %     Weight 08/22/19 1158 264 lb 6.4 oz (119.9 kg)  Height 08/22/19 1159 5\' 6"  (1.676 m)     Head Circumference --      Peak Flow --      Pain Score 08/22/19 1159 7     Pain Loc --      Pain Edu? --      Excl. in [place]? --     Physical Exam Vitals signs and nursing note reviewed.  Constitutional:      General: She is not in acute distress.    Appearance: She is well-developed.  HENT:     Head: Normocephalic and atraumatic.     Nose: Nose normal.     Mouth/Throat:     Mouth: Mucous membranes are moist.  Eyes:     Extraocular Movements: Extraocular movements intact.     Conjunctiva/sclera: Conjunctivae normal.     Pupils: Pupils are equal, round, and reactive to light.  Neck:     Musculoskeletal: Normal range of motion and neck supple. No muscular tenderness.  Cardiovascular:     Rate and Rhythm: Normal rate and regular rhythm.     Pulses: Normal pulses.     Heart sounds: Normal heart sounds. No murmur.  Pulmonary:     Effort: Pulmonary effort is normal. No respiratory distress.     Breath sounds: Normal breath sounds.  Abdominal:     Palpations: Abdomen is soft.     Tenderness: There is no abdominal tenderness.  Musculoskeletal:        General: Tenderness (TTP to right ankle) present. No swelling.  Skin:    General: Skin is warm and dry.  Neurological:     General: No focal deficit present.     Mental Status: She is alert and oriented to  person, place, and time.     Cranial Nerves: No cranial nerve deficit.     Sensory: No sensory deficit.     Motor: No weakness.     Coordination: Coordination normal.     Comments: 5+ out of 5 strength throughout, normal sensation, no drift, normal finger-to-nose finger, normal speech  Psychiatric:        Mood and Affect: Mood normal.      ED Treatments / Results  Labs (all labs ordered are listed, but only abnormal results are displayed) Labs Reviewed  CBC WITH DIFFERENTIAL/PLATELET - Abnormal; Notable for the following components:      Result Value   RDW 17.2 (*)    Monocytes Absolute 1.2 (*)    All other components within normal limits  BASIC METABOLIC PANEL - Abnormal; Notable for the following components:   Glucose, Bld 128 (*)    BUN 34 (*)    All other components within normal limits    EKG EKG Interpretation  Date/Time:  Thursday August 22 2019 12:11:57 EDT Ventricular Rate:  88 PR Interval:  150 QRS Duration: 88 QT Interval:  370 QTC Calculation: 447 R Axis:   -29 Text Interpretation: Normal sinus rhythm Nonspecific ST and T wave abnormality Abnormal ECG Confirmed by Lennice Sites [phone]) on 08/22/2019 12:52:53 PM   Radiology Dg Ankle Complete Right  Result Date: 08/22/2019 CLINICAL DATA:  Right ankle pain after fall. EXAM: RIGHT ANKLE - COMPLETE 3+ VIEW COMPARISON:  None. FINDINGS: Probable minimally displaced fracture is seen involving the posterior malleolus of the distal tibia. No other acute fracture is noted. There is some widening of the medial portion of the talotibial joint suggesting ligamentous injury. IMPRESSION: Probable minimally displaced posterior malleolar fracture of distal tibia is noted. Mild widening  of medial portion of talotibial joint is noted suggesting ligamentous injury. Electronically Signed   By: Marijo Conception M.D.   On: 08/22/2019 13:18   Ct Head Wo Contrast  Result Date: 08/22/2019 CLINICAL DATA:  Syncopal episode resulting  in a fall down steps today with her head at the bottom of the steps. EXAM: CT HEAD WITHOUT CONTRAST TECHNIQUE: Contiguous axial images were obtained from the base of the skull through the vertex without intravenous contrast. COMPARISON:  None. FINDINGS: Brain: Normal appearing cerebral hemispheres and posterior fossa structures. Normal size and position of the ventricles. No intracranial hemorrhage, mass lesion or CT evidence of acute infarction. Vascular: No hyperdense vessel or unexpected calcification. Skull: Bilateral hyperostosis frontalis. Small left occipital exostosis. No fractures. Sinuses/Orbits: Unremarkable. Other: None. IMPRESSION: No acute abnormality. Electronically Signed   By: Claudie Revering M.D.   On: 08/22/2019 13:00   Dg Foot Complete Right  Result Date: 08/22/2019 CLINICAL DATA:  Right foot pain after fall today. EXAM: RIGHT FOOT COMPLETE - 3+ VIEW COMPARISON:  None. FINDINGS: As noted on ankle radiographs of same day, minimally displaced posterior malleolar fracture of distal right tibia is noted. No other fracture or dislocation is noted. Moderate posterior calcaneal spurring is noted. Soft tissues are unremarkable. Joint spaces are intact. IMPRESSION: Minimally displaced posterior malleolar fracture of distal right tibia. No other significant abnormality seen in the right foot. Electronically Signed   By: Marijo Conception M.D.   On: 08/22/2019 13:20    Procedures Procedures (including critical care time)  Medications Ordered in ED Medications  fentaNYL (SUBLIMAZE) injection 50 mcg (50 mcg Intravenous Given 08/22/19 1416)     Initial Impression / Assessment and Plan / ED Course  I have reviewed the triage vital signs and the nursing notes.  Pertinent labs & imaging results that were available during my care of the patient were reviewed by me and considered in my medical decision making (see chart for details).     Hye Townshend is a 58 year old female with history of  chronic fatigue syndrome, chronic syncopal episodes, fibromyalgia, diabetes who presents to the ED after a fall today.  Patient with pain in the right ankle.  Patient states that she was standing outside while waiting for her dog to come inside she got lightheaded and passed out.  Has a history of the same.  Has had extensive cardiac work-ups with heart monitor, echocardiogram, stress test.  She is also had extensive neurology work-up.  Denies any history of seizures.  Cannot remember if she ever had an EEG.  However she did not appear to have any postictal state.  States no confusion, no tongue biting, no incontinence.  Patient with swelling and bruising to the right ankle.  She states that it felt like originally ankle was out of place but ankle appears overall intact.  There is no major lacerations.  Has mild abrasion over the anterior ankle.  X-rays show likely posterior malleolus fracture with likely ligamentous injury.  Talked with Dr. Edmonia Lynch with orthopedics and will place in splint and have her follow-up tomorrow.  Will prescribe narcotic pain medicine.  Will prescribe knee scooter.  Lab work otherwise showed no significant electrolyte abnormality, kidney injury.  No leukocytosis.  CT scan of the head was unremarkable.  Recommended that she follow-up with a neurologist to discuss may be a need for an EEG.  Recommend that she not drive until she is cleared by neurology.  Can follow-up with her primary care  doctor as well.  Overall likely chronic syncopal event.  Likely vasovagal event.  Discharged in ED in good condition.  Given return precautions.  This chart was dictated using voice recognition software.  Despite best efforts to proofread,  errors can occur which can change the documentation meaning.    Final Clinical Impressions(s) / ED Diagnoses   Final diagnoses:  Closed fracture of right ankle, initial encounter    ED Discharge Orders         Ordered    oxyCODONE (ROXICODONE) 5 MG  immediate release tablet  Every 6 hours PRN     08/22/19 1443           Riel Hirschman, [place], DO 08/22/19 1449

## 2019-08-22 NOTE — ED Notes (Signed)
Patient transported to CT 

## 2019-08-22 NOTE — ED Notes (Signed)
Attempt IV placement in left hand, able to draw blood but not able to place IV.  Second RN to come place IV US guided, pt notified.

## 2019-08-22 NOTE — ED Notes (Signed)
ED Provider at bedside. 

## 2019-08-22 NOTE — Discharge Instructions (Signed)
Follow-up with neurology to discuss possible EEG.  Please avoid driving until you talk with your primary care doctor and neurology.

## 2019-08-23 ENCOUNTER — Other Ambulatory Visit (HOSPITAL_COMMUNITY)
Admission: RE | Admit: 2019-08-23 | Discharge: 2019-08-23 | Disposition: A | Discharge status: Home / Self Care | Payer: TRICARE For Life (TFL) | Source: Ambulatory Visit | Attending: Orthopedic Surgery | Admitting: Orthopedic Surgery

## 2019-08-23 DIAGNOSIS — Z01812 Encounter for preprocedural laboratory examination: Secondary | ICD-10-CM | POA: Insufficient documentation

## 2019-08-23 DIAGNOSIS — Z20828 Contact with and (suspected) exposure to other viral communicable diseases: Secondary | ICD-10-CM | POA: Insufficient documentation

## 2019-08-24 LAB — NOVEL CORONAVIRUS, NAA (HOSP ORDER, SEND-OUT TO REF LAB; TAT 18-24 HRS): SARS-CoV-2, NAA: NOT DETECTED

## 2019-08-26 NOTE — H&P (Signed)
[facility] [address], [place] [address] [phone] A Division of [facility]  RE: Chellsey, Malony   [id number]      DOB: [birth year] INITIAL EVALUATION:  08-23-19   REASON FOR VISIT: Right ankle injury.   HPI:   This is a referral from the emergency department, [place] for a right ankle injury she suffered on 08-22-19. She has had a few syncopal episodes in the past which she has been worked up for . This happened yesterday  and she fell . She had significant pain in her right ankle afterwards and it was felt it was significantly displaced. It did reduce itself. X-rays were performed in the emergency room which showed a posterior malleolar fracture and widening of the medial clear space.   EXAMINATION: Well appearing female sitting comfortably on the examination table, in no apparent distress.  She has tenderness at the proximal fibula as well. Neurovascularly intact to her toes.  IMAGES: X-rays were taken of her knee which demonstrate a fibula fracture proximally.   ASSESSMENT/PLAN: She has a  Maisonneuve fracture trimalleolar equivalent fracture. I recommend an open reduction and internal fixation of this with repair of her syndesmosis with an Arthrex dual plate and dual tightrope. She will elevate as much as possible.   Ernesta Amble.  Percell Miller, M.D.  Electronically verified by Ernesta Amble. Percell Miller, M.D. TDMDelorise Royals  D:  08-23-19 T:  08-26-19 cc: Glendon Axe, MD  fax [phone]

## 2019-08-27 ENCOUNTER — Ambulatory Visit (HOSPITAL_COMMUNITY): Payer: TRICARE For Life (TFL) | Admitting: Certified Registered Nurse Anesthetist

## 2019-08-27 ENCOUNTER — Other Ambulatory Visit: Payer: Self-pay

## 2019-08-27 ENCOUNTER — Encounter (HOSPITAL_COMMUNITY)
Admission: RE | Disposition: A | Discharge status: Home / Self Care | Payer: Self-pay | Source: Home / Self Care | Attending: Orthopedic Surgery

## 2019-08-27 ENCOUNTER — Encounter (HOSPITAL_COMMUNITY): Payer: Self-pay | Admitting: Certified Registered Nurse Anesthetist

## 2019-08-27 ENCOUNTER — Observation Stay (HOSPITAL_COMMUNITY)
Admission: RE | Admit: 2019-08-27 | Discharge: 2019-08-28 | Disposition: A | Discharge status: Home / Self Care | Payer: TRICARE For Life (TFL) | Attending: Orthopedic Surgery | Admitting: Orthopedic Surgery

## 2019-08-27 DIAGNOSIS — Z7982 Long term (current) use of aspirin: Secondary | ICD-10-CM | POA: Insufficient documentation

## 2019-08-27 DIAGNOSIS — Z880 Allergy status to penicillin: Secondary | ICD-10-CM | POA: Diagnosis not present

## 2019-08-27 DIAGNOSIS — I10 Essential (primary) hypertension: Secondary | ICD-10-CM | POA: Diagnosis not present

## 2019-08-27 DIAGNOSIS — W109XXA Fall (on) (from) unspecified stairs and steps, initial encounter: Secondary | ICD-10-CM | POA: Diagnosis not present

## 2019-08-27 DIAGNOSIS — E119 Type 2 diabetes mellitus without complications: Secondary | ICD-10-CM | POA: Diagnosis not present

## 2019-08-27 DIAGNOSIS — S82861A Displaced Maisonneuve's fracture of right leg, initial encounter for closed fracture: Secondary | ICD-10-CM | POA: Diagnosis not present

## 2019-08-27 DIAGNOSIS — M1990 Unspecified osteoarthritis, unspecified site: Secondary | ICD-10-CM | POA: Insufficient documentation

## 2019-08-27 DIAGNOSIS — M797 Fibromyalgia: Secondary | ICD-10-CM | POA: Insufficient documentation

## 2019-08-27 DIAGNOSIS — Z794 Long term (current) use of insulin: Secondary | ICD-10-CM | POA: Diagnosis not present

## 2019-08-27 DIAGNOSIS — Z881 Allergy status to other antibiotic agents status: Secondary | ICD-10-CM | POA: Insufficient documentation

## 2019-08-27 DIAGNOSIS — R5382 Chronic fatigue, unspecified: Secondary | ICD-10-CM | POA: Insufficient documentation

## 2019-08-27 DIAGNOSIS — Z79899 Other long term (current) drug therapy: Secondary | ICD-10-CM | POA: Diagnosis not present

## 2019-08-27 DIAGNOSIS — Z885 Allergy status to narcotic agent status: Secondary | ICD-10-CM | POA: Diagnosis not present

## 2019-08-27 DIAGNOSIS — S82891A Other fracture of right lower leg, initial encounter for closed fracture: Secondary | ICD-10-CM | POA: Diagnosis present

## 2019-08-27 DIAGNOSIS — Z888 Allergy status to other drugs, medicaments and biological substances status: Secondary | ICD-10-CM | POA: Diagnosis not present

## 2019-08-27 HISTORY — PX: ORIF ANKLE FRACTURE: SHX5408

## 2019-08-27 LAB — GLUCOSE, CAPILLARY
Glucose-Capillary: 116 mg/dL — ABNORMAL HIGH (ref 70–99)
Glucose-Capillary: 101 mg/dL — ABNORMAL HIGH (ref 70–99)
Glucose-Capillary: 159 mg/dL — ABNORMAL HIGH (ref 70–99)
Glucose-Capillary: 215 mg/dL — ABNORMAL HIGH (ref 70–99)

## 2019-08-27 LAB — SURGICAL PCR SCREEN
MRSA, PCR: NEGATIVE
Staphylococcus aureus: NEGATIVE

## 2019-08-27 SURGERY — OPEN REDUCTION INTERNAL FIXATION (ORIF) ANKLE FRACTURE
Anesthesia: General | Site: Ankle | Laterality: Right

## 2019-08-27 MED ORDER — BUPIVACAINE HCL (PF) 0.5 % IJ SOLN
INTRAMUSCULAR | Status: AC
  Filled 2019-08-27: qty 30

## 2019-08-27 MED ORDER — PANTOPRAZOLE SODIUM 40 MG PO TBEC
40.0000 mg | DELAYED_RELEASE_TABLET | Freq: Every day | ORAL | Status: DC
  Administered 2019-08-27: 40 mg via ORAL
  Filled 2019-08-27: qty 1

## 2019-08-27 MED ORDER — OXYCODONE HCL 5 MG PO TABS: 5.0000 mg | ORAL_TABLET | ORAL | 0 refills | Status: AC | PRN

## 2019-08-27 MED ORDER — AMLODIPINE BESYLATE 5 MG PO TABS
5.0000 mg | ORAL_TABLET | Freq: Every day | ORAL | Status: DC
  Administered 2019-08-28: 5 mg via ORAL
  Filled 2019-08-27: qty 1

## 2019-08-27 MED ORDER — ENSURE PRE-SURGERY PO LIQD
296.0000 mL | Freq: Once | ORAL | Status: DC
  Filled 2019-08-27: qty 296

## 2019-08-27 MED ORDER — LACTATED RINGERS IV SOLN
INTRAVENOUS | Status: DC
  Administered 2019-08-27: 18:00:00 via INTRAVENOUS

## 2019-08-27 MED ORDER — DEXAMETHASONE SODIUM PHOSPHATE 10 MG/ML IJ SOLN
INTRAMUSCULAR | Status: DC | PRN
  Administered 2019-08-27: 10 mg via INTRAVENOUS

## 2019-08-27 MED ORDER — METOCLOPRAMIDE HCL 5 MG/ML IJ SOLN: 5.0000 mg | Freq: Three times a day (TID) | INTRAMUSCULAR | Status: DC | PRN

## 2019-08-27 MED ORDER — LIDOCAINE 2% (20 MG/ML) 5 ML SYRINGE
INTRAMUSCULAR | Status: AC
  Filled 2019-08-27: qty 5

## 2019-08-27 MED ORDER — METHOCARBAMOL 500 MG PO TABS
500.0000 mg | ORAL_TABLET | Freq: Four times a day (QID) | ORAL | Status: DC | PRN
  Administered 2019-08-27: 500 mg via ORAL
  Filled 2019-08-27: qty 1

## 2019-08-27 MED ORDER — DIPHENHYDRAMINE HCL 12.5 MG/5ML PO ELIX: 12.5000 mg | ORAL_SOLUTION | ORAL | Status: DC | PRN

## 2019-08-27 MED ORDER — HYDROCHLOROTHIAZIDE 12.5 MG PO CAPS
12.5000 mg | ORAL_CAPSULE | Freq: Every day | ORAL | Status: DC
  Administered 2019-08-28: 12.5 mg via ORAL
  Filled 2019-08-27: qty 1

## 2019-08-27 MED ORDER — DOCUSATE SODIUM 100 MG PO CAPS
100.0000 mg | ORAL_CAPSULE | Freq: Two times a day (BID) | ORAL | Status: DC
  Administered 2019-08-27: 100 mg via ORAL
  Filled 2019-08-27: qty 1

## 2019-08-27 MED ORDER — INSULIN GLARGINE 100 UNIT/ML ~~LOC~~ SOLN
25.0000 [IU] | Freq: Every day | SUBCUTANEOUS | Status: DC
  Administered 2019-08-27: 25 [IU] via SUBCUTANEOUS
  Filled 2019-08-27 (×3): qty 0.25

## 2019-08-27 MED ORDER — 0.9 % SODIUM CHLORIDE (POUR BTL) OPTIME
TOPICAL | Status: DC | PRN
  Administered 2019-08-27: 1000 mL

## 2019-08-27 MED ORDER — ASPIRIN EC 81 MG PO TBEC
81.0000 mg | DELAYED_RELEASE_TABLET | Freq: Every day | ORAL | Status: DC
  Administered 2019-08-27: 81 mg via ORAL
  Filled 2019-08-27: qty 1

## 2019-08-27 MED ORDER — ACETAMINOPHEN 500 MG PO TABS
1000.0000 mg | ORAL_TABLET | Freq: Three times a day (TID) | ORAL | Status: DC
  Administered 2019-08-27 – 2019-08-28 (×2): 1000 mg via ORAL
  Filled 2019-08-27 (×2): qty 2

## 2019-08-27 MED ORDER — CELECOXIB 200 MG PO CAPS: 200.0000 mg | ORAL_CAPSULE | Freq: Two times a day (BID) | ORAL | 0 refills | Status: AC

## 2019-08-27 MED ORDER — NITROFURANTOIN MACROCRYSTAL 50 MG PO CAPS
50.0000 mg | ORAL_CAPSULE | Freq: Every day | ORAL | Status: DC
  Administered 2019-08-27: 50 mg via ORAL
  Filled 2019-08-27: qty 1

## 2019-08-27 MED ORDER — PROPRANOLOL HCL 20 MG PO TABS
20.0000 mg | ORAL_TABLET | Freq: Two times a day (BID) | ORAL | Status: DC
  Administered 2019-08-27 – 2019-08-28 (×2): 20 mg via ORAL
  Filled 2019-08-27 (×2): qty 1

## 2019-08-27 MED ORDER — EPHEDRINE 5 MG/ML INJ
INTRAVENOUS | Status: AC
  Filled 2019-08-27: qty 10

## 2019-08-27 MED ORDER — ONDANSETRON HCL 4 MG/2ML IJ SOLN: 4.0000 mg | Freq: Four times a day (QID) | INTRAMUSCULAR | Status: DC | PRN

## 2019-08-27 MED ORDER — PREGABALIN 75 MG PO CAPS
150.0000 mg | ORAL_CAPSULE | Freq: Every day | ORAL | Status: DC
  Administered 2019-08-28: 09:00:00 150 mg via ORAL
  Filled 2019-08-27: qty 2

## 2019-08-27 MED ORDER — KETOROLAC TROMETHAMINE 15 MG/ML IJ SOLN
15.0000 mg | Freq: Four times a day (QID) | INTRAMUSCULAR | Status: DC
  Administered 2019-08-28 (×2): 15 mg via INTRAVENOUS
  Filled 2019-08-27 (×2): qty 1

## 2019-08-27 MED ORDER — ASPIRIN 81 MG PO TABS: 81.0000 mg | ORAL_TABLET | Freq: Every day | ORAL | Status: DC

## 2019-08-27 MED ORDER — ACETAMINOPHEN 325 MG PO TABS: 325.0000 mg | ORAL_TABLET | Freq: Four times a day (QID) | ORAL | Status: DC | PRN

## 2019-08-27 MED ORDER — FENTANYL CITRATE (PF) 100 MCG/2ML IJ SOLN: 25.0000 ug | INTRAMUSCULAR | Status: DC | PRN

## 2019-08-27 MED ORDER — LACTATED RINGERS IV SOLN
INTRAVENOUS | Status: DC
  Administered 2019-08-27 (×2): via INTRAVENOUS

## 2019-08-27 MED ORDER — LIDOCAINE 2% (20 MG/ML) 5 ML SYRINGE
INTRAMUSCULAR | Status: DC | PRN
  Administered 2019-08-27: 60 mg via INTRAVENOUS

## 2019-08-27 MED ORDER — CHLORHEXIDINE GLUCONATE 4 % EX LIQD: 60.0000 mL | Freq: Once | CUTANEOUS | Status: DC

## 2019-08-27 MED ORDER — INSULIN ASPART 100 UNIT/ML ~~LOC~~ SOLN: 0.0000 [IU] | Freq: Three times a day (TID) | SUBCUTANEOUS | Status: DC

## 2019-08-27 MED ORDER — HYDROMORPHONE HCL 1 MG/ML IJ SOLN: 0.5000 mg | INTRAMUSCULAR | Status: DC | PRN

## 2019-08-27 MED ORDER — METFORMIN HCL 500 MG PO TABS
1000.0000 mg | ORAL_TABLET | Freq: Two times a day (BID) | ORAL | Status: DC
  Administered 2019-08-27 – 2019-08-28 (×2): 1000 mg via ORAL
  Filled 2019-08-27 (×2): qty 2

## 2019-08-27 MED ORDER — ONDANSETRON HCL 4 MG/2ML IJ SOLN
INTRAMUSCULAR | Status: AC
  Filled 2019-08-27: qty 2

## 2019-08-27 MED ORDER — IRBESARTAN 150 MG PO TABS
150.0000 mg | ORAL_TABLET | Freq: Every day | ORAL | Status: DC
  Administered 2019-08-28: 150 mg via ORAL
  Filled 2019-08-27: qty 1

## 2019-08-27 MED ORDER — METOCLOPRAMIDE HCL 5 MG PO TABS: 5.0000 mg | ORAL_TABLET | Freq: Three times a day (TID) | ORAL | Status: DC | PRN

## 2019-08-27 MED ORDER — DULOXETINE HCL 60 MG PO CPEP
60.0000 mg | ORAL_CAPSULE | Freq: Two times a day (BID) | ORAL | Status: DC
  Administered 2019-08-27 – 2019-08-28 (×2): 60 mg via ORAL
  Filled 2019-08-27 (×2): qty 1

## 2019-08-27 MED ORDER — PREGABALIN 100 MG PO CAPS
300.0000 mg | ORAL_CAPSULE | Freq: Every day | ORAL | Status: DC
  Administered 2019-08-27: 300 mg via ORAL
  Filled 2019-08-27: qty 3

## 2019-08-27 MED ORDER — ONDANSETRON HCL 4 MG PO TABS: 4.0000 mg | ORAL_TABLET | Freq: Four times a day (QID) | ORAL | Status: DC | PRN

## 2019-08-27 MED ORDER — APIXABAN 2.5 MG PO TABS
2.5000 mg | ORAL_TABLET | Freq: Two times a day (BID) | ORAL | Status: DC
  Administered 2019-08-27 – 2019-08-28 (×2): 2.5 mg via ORAL
  Filled 2019-08-27 (×2): qty 1

## 2019-08-27 MED ORDER — FENTANYL CITRATE (PF) 100 MCG/2ML IJ SOLN
INTRAMUSCULAR | Status: AC
  Filled 2019-08-27: qty 2

## 2019-08-27 MED ORDER — BUPIVACAINE HCL (PF) 0.5 % IJ SOLN
INTRAMUSCULAR | Status: DC | PRN
  Administered 2019-08-27: 30 mL

## 2019-08-27 MED ORDER — AMLODIPINE-VALSARTAN-HCTZ 5-160-12.5 MG PO TABS: 1.0000 | ORAL_TABLET | ORAL | Status: DC

## 2019-08-27 MED ORDER — CELECOXIB 200 MG PO CAPS
400.0000 mg | ORAL_CAPSULE | Freq: Once | ORAL | Status: AC
  Administered 2019-08-27: 400 mg via ORAL
  Filled 2019-08-27: qty 2

## 2019-08-27 MED ORDER — METHOCARBAMOL 500 MG IVPB - SIMPLE MED
500.0000 mg | Freq: Four times a day (QID) | INTRAVENOUS | Status: DC | PRN
  Filled 2019-08-27: qty 50

## 2019-08-27 MED ORDER — ONDANSETRON HCL 4 MG PO TABS: 4.0000 mg | ORAL_TABLET | Freq: Three times a day (TID) | ORAL | 0 refills | Status: DC | PRN

## 2019-08-27 MED ORDER — APIXABAN 2.5 MG PO TABS: 2.5000 mg | ORAL_TABLET | Freq: Two times a day (BID) | ORAL | 0 refills | Status: DC

## 2019-08-27 MED ORDER — ACETAMINOPHEN 500 MG PO TABS
1000.0000 mg | ORAL_TABLET | Freq: Once | ORAL | Status: AC
  Administered 2019-08-27: 1000 mg via ORAL
  Filled 2019-08-27: qty 2

## 2019-08-27 MED ORDER — CLINDAMYCIN PHOSPHATE 600 MG/50ML IV SOLN
600.0000 mg | Freq: Four times a day (QID) | INTRAVENOUS | Status: AC
  Administered 2019-08-27 – 2019-08-28 (×3): 600 mg via INTRAVENOUS
  Filled 2019-08-27 (×3): qty 50

## 2019-08-27 MED ORDER — PHENYLEPHRINE 40 MCG/ML (10ML) SYRINGE FOR IV PUSH (FOR BLOOD PRESSURE SUPPORT)
PREFILLED_SYRINGE | INTRAVENOUS | Status: DC | PRN
  Administered 2019-08-27 (×5): 80 ug via INTRAVENOUS

## 2019-08-27 MED ORDER — PROPOFOL 10 MG/ML IV BOLUS
INTRAVENOUS | Status: DC | PRN
  Administered 2019-08-27: 200 mg via INTRAVENOUS

## 2019-08-27 MED ORDER — CLINDAMYCIN PHOSPHATE 900 MG/50ML IV SOLN
900.0000 mg | INTRAVENOUS | Status: AC
  Administered 2019-08-27: 900 mg via INTRAVENOUS
  Filled 2019-08-27: qty 50

## 2019-08-27 MED ORDER — FENTANYL CITRATE (PF) 100 MCG/2ML IJ SOLN
50.0000 ug | INTRAMUSCULAR | Status: DC
  Administered 2019-08-27: 11:00:00 100 ug via INTRAVENOUS
  Filled 2019-08-27: qty 2

## 2019-08-27 MED ORDER — FENTANYL CITRATE (PF) 100 MCG/2ML IJ SOLN
50.0000 ug | Freq: Once | INTRAMUSCULAR | Status: AC
  Administered 2019-08-27: 50 ug via INTRAVENOUS

## 2019-08-27 MED ORDER — ONDANSETRON HCL 4 MG/2ML IJ SOLN
INTRAMUSCULAR | Status: DC | PRN
  Administered 2019-08-27: 4 mg via INTRAVENOUS

## 2019-08-27 MED ORDER — OXYCODONE HCL 5 MG PO TABS: 5.0000 mg | ORAL_TABLET | ORAL | Status: DC | PRN

## 2019-08-27 MED ORDER — EPHEDRINE SULFATE-NACL 50-0.9 MG/10ML-% IV SOSY
PREFILLED_SYRINGE | INTRAVENOUS | Status: DC | PRN
  Administered 2019-08-27 (×7): 10 mg via INTRAVENOUS

## 2019-08-27 MED ORDER — ACETAMINOPHEN 500 MG PO TABS: 1000.0000 mg | ORAL_TABLET | Freq: Three times a day (TID) | ORAL | 0 refills | Status: AC

## 2019-08-27 MED ORDER — CANAGLIFLOZIN 100 MG PO TABS
100.0000 mg | ORAL_TABLET | Freq: Every day | ORAL | Status: DC
  Filled 2019-08-27: qty 1

## 2019-08-27 MED ORDER — FENTANYL CITRATE (PF) 100 MCG/2ML IJ SOLN
INTRAMUSCULAR | Status: AC
  Administered 2019-08-27: 100 ug via INTRAVENOUS
  Filled 2019-08-27: qty 2

## 2019-08-27 MED ORDER — POLYETHYLENE GLYCOL 3350 17 G PO PACK: 17.0000 g | PACK | Freq: Every day | ORAL | Status: DC | PRN

## 2019-08-27 MED ORDER — DEXAMETHASONE SODIUM PHOSPHATE 10 MG/ML IJ SOLN
INTRAMUSCULAR | Status: AC
  Filled 2019-08-27: qty 1

## 2019-08-27 MED ORDER — METHOCARBAMOL 500 MG PO TABS: 500.0000 mg | ORAL_TABLET | Freq: Three times a day (TID) | ORAL | 0 refills | Status: DC | PRN

## 2019-08-27 MED ORDER — PHENYLEPHRINE 40 MCG/ML (10ML) SYRINGE FOR IV PUSH (FOR BLOOD PRESSURE SUPPORT)
PREFILLED_SYRINGE | INTRAVENOUS | Status: AC
  Filled 2019-08-27: qty 10

## 2019-08-27 MED ORDER — PREGABALIN 75 MG PO CAPS: 150.0000 mg | ORAL_CAPSULE | Freq: Two times a day (BID) | ORAL | Status: DC

## 2019-08-27 SURGICAL SUPPLY — 59 items
BAG ZIPLOCK 12X15 (MISCELLANEOUS) ×2 IMPLANT
BANDAGE ESMARK 6X9 LF (GAUZE/BANDAGES/DRESSINGS) ×1 IMPLANT
BLADE SURG 15 STRL LF DISP TIS (BLADE) ×1 IMPLANT
BLADE SURG 15 STRL SS (BLADE) ×1
BNDG COHESIVE 6X5 TAN STRL LF (GAUZE/BANDAGES/DRESSINGS) ×2 IMPLANT
BNDG ELASTIC 4X5.8 VLCR STR LF (GAUZE/BANDAGES/DRESSINGS) ×2 IMPLANT
BNDG ELASTIC 6X10 VLCR STRL LF (GAUZE/BANDAGES/DRESSINGS) ×2 IMPLANT
BNDG ESMARK 6X9 LF (GAUZE/BANDAGES/DRESSINGS) ×2
CHLORAPREP W/TINT 26 (MISCELLANEOUS) ×2 IMPLANT
CLSR STERI-STRIP ANTIMIC 1/2X4 (GAUZE/BANDAGES/DRESSINGS) IMPLANT
COVER MAYO STAND STRL (DRAPES) ×2 IMPLANT
COVER SURGICAL LIGHT HANDLE (MISCELLANEOUS) ×2 IMPLANT
COVER WAND RF STERILE (DRAPES) ×2 IMPLANT
CUFF TOURN SGL QUICK 34 (TOURNIQUET CUFF) ×1
CUFF TRNQT CYL 34X4.125X (TOURNIQUET CUFF) ×1 IMPLANT
DECANTER SPIKE VIAL GLASS SM (MISCELLANEOUS) IMPLANT
DRAPE C-ARM 42X120 X-RAY (DRAPES) IMPLANT
DRAPE IMP U-DRAPE 54X76 (DRAPES) ×2 IMPLANT
DRAPE U-SHAPE 47X51 STRL (DRAPES) ×2 IMPLANT
DRSG ADAPTIC 3X8 NADH LF (GAUZE/BANDAGES/DRESSINGS) ×2 IMPLANT
DRSG EMULSION OIL 3X3 NADH (GAUZE/BANDAGES/DRESSINGS) ×2 IMPLANT
DRSG PAD ABDOMINAL 8X10 ST (GAUZE/BANDAGES/DRESSINGS) ×4 IMPLANT
ELECT REM PT RETURN 15FT ADLT (MISCELLANEOUS) ×2 IMPLANT
GAUZE SPONGE 4X4 12PLY STRL (GAUZE/BANDAGES/DRESSINGS) IMPLANT
GLOVE BIO SURGEON STRL SZ7.5 (GLOVE) ×4 IMPLANT
GLOVE BIOGEL PI IND STRL 8 (GLOVE) ×2 IMPLANT
GLOVE BIOGEL PI INDICATOR 8 (GLOVE) ×2
GOWN STRL REUS W/ TWL LRG LVL3 (GOWN DISPOSABLE) ×1 IMPLANT
GOWN STRL REUS W/ TWL XL LVL3 (GOWN DISPOSABLE) ×1 IMPLANT
GOWN STRL REUS W/TWL LRG LVL3 (GOWN DISPOSABLE) ×1
GOWN STRL REUS W/TWL XL LVL3 (GOWN DISPOSABLE) ×1
KIT BASIN OR (CUSTOM PROCEDURE TRAY) ×2 IMPLANT
KIT TURNOVER KIT A (KITS) IMPLANT
MANIFOLD NEPTUNE II (INSTRUMENTS) ×2 IMPLANT
NEEDLE HYPO 22GX1.5 SAFETY (NEEDLE) ×2 IMPLANT
NS IRRIG 1000ML POUR BTL (IV SOLUTION) ×2 IMPLANT
PACK ORTHO EXTREMITY (CUSTOM PROCEDURE TRAY) ×2 IMPLANT
PAD CAST 4YDX4 CTTN HI CHSV (CAST SUPPLIES) ×2 IMPLANT
PADDING CAST ABS 6INX4YD NS (CAST SUPPLIES) ×2
PADDING CAST ABS COTTON 6X4 NS (CAST SUPPLIES) ×2 IMPLANT
PADDING CAST COTTON 4X4 STRL (CAST SUPPLIES) ×2
PLATE DUAL 2HOLE SYNDESMOSIS (Plate) ×2 IMPLANT
PROTECTOR NERVE ULNAR (MISCELLANEOUS) ×2 IMPLANT
STRIP CLOSURE SKIN 1/2X4 (GAUZE/BANDAGES/DRESSINGS) ×2 IMPLANT
SUCTION FRAZIER HANDLE 10FR (MISCELLANEOUS) ×1
SUCTION TUBE FRAZIER 10FR DISP (MISCELLANEOUS) ×1 IMPLANT
SUT ETHILON 3 0 PS 1 (SUTURE) IMPLANT
SUT MNCRL AB 4-0 PS2 18 (SUTURE) ×2 IMPLANT
SUT MON AB 2-0 CT1 36 (SUTURE) ×2 IMPLANT
SUT MON AB 3-0 SH 27 (SUTURE)
SUT MON AB 3-0 SH27 (SUTURE) IMPLANT
SUT VIC AB 0 CT1 36 (SUTURE) ×4 IMPLANT
SUT VIC AB 2-0 SH 27 (SUTURE) ×1
SUT VIC AB 2-0 SH 27XBRD (SUTURE) ×1 IMPLANT
SYR CONTROL 10ML LL (SYRINGE) ×2 IMPLANT
TOWEL OR 17X26 10 PK STRL BLUE (TOWEL DISPOSABLE) ×2 IMPLANT
TOWEL OR NON WOVEN STRL DISP B (DISPOSABLE) ×2 IMPLANT
UNDERPAD 30X36 HEAVY ABSORB (UNDERPADS AND DIAPERS) ×2 IMPLANT
YANKAUER SUCT BULB TIP 10FT TU (MISCELLANEOUS) ×2 IMPLANT

## 2019-08-27 NOTE — Op Note (Signed)
08/27/2019  12:30 PM  PATIENT:  Wendy Barber    PRE-OPERATIVE DIAGNOSIS:  RIGHT ANKLE FRACTURE  POST-OPERATIVE DIAGNOSIS:  Same  PROCEDURE:  OPEN REDUCTION INTERNAL FIXATION RIGHT ANKLE FRACTURE  SURGEON:  Renette Butters, MD  ASSISTANT: Roxan Hockey, PA-C, he was present and scrubbed throughout the case, critical for completion in a timely fashion, and for retraction, instrumentation, and closure.   ANESTHESIA:   gen   PREOPERATIVE INDICATIONS:  Wendy Barber is a  58 y.o. female with a diagnosis of RIGHT ANKLE FRACTURE who failed conservative measures and elected for surgical management.    The risks benefits and alternatives were discussed with the patient preoperatively including but not limited to the risks of infection, bleeding, nerve injury, cardiopulmonary complications, the need for revision surgery, among others, and the patient was willing to proceed.  OPERATIVE IMPLANTS: arthrex 2 hole plate and tight ropes  OPERATIVE FINDINGS: Unstable ankle fracture. Stable syndesmosis post op  BLOOD LOSS: min  COMPLICATIONS: none  TOURNIQUET TIME: 79min  OPERATIVE PROCEDURE:  Patient was identified in the preoperative holding area and site was marked by me He was transported to the operating theater and placed on the table in supine position taking care to pad all bony prominences. After a preincinduction time out anesthesia was induced. The right lower extremity was prepped and draped in normal sterile fashion and a pre-incision timeout was performed. Wendy Barber received clinda for preoperative antibiotics.   I made a lateral incision of roughly 7 cm dissection was carried down sharply to the distal fibula and then spreading dissection was used proximally to protect the superficial peroneal nerve. I sharply incised the periosteum and took care to protect the peroneal tendons. I then debrided the fracture site and performed a reduction maneuver which was held in place with  a clamp.   I then stressed the syndesmosis and it was unstable for syndesmotic fixation I performed a reduction maneuver with a clamp and placed 2 tight ropes through a plate. One more anterior and one more neutral.   This fixation reduced her bimall equivalent ankle fracture and stabilized this as well.  I assesed the posterior mal piece and it was small enough to not require fixation as it involved less than 20% of the articular surface  The wound was then thoroughly irrigated and closed using a 0 Vicryl and absorbable Monocryl sutures. He was placed in a short leg splint.   POST OPERATIVE PLAN: Non-weightbearing. DVT prophylaxis will consist of mobilization and chemical px

## 2019-08-27 NOTE — Progress Notes (Signed)
AssistedDr. Belinda Block with right, popliteal block. Side rails up, monitors on throughout procedure. See vital signs in flow sheet. Tolerated Procedure well.

## 2019-08-27 NOTE — Discharge Instructions (Addendum)
Information on my medicine - ELIQUIS (apixaban)  This medication education was reviewed with me or my healthcare representative as part of my discharge preparation.   Why was Eliquis prescribed for you? Eliquis was prescribed for you to reduce the risk of blood clots forming after orthopedic surgery.    What do You need to know about Eliquis? Take your Eliquis TWICE DAILY - one tablet in the morning and one tablet in the evening with or without food.  It would be best to take the dose about the same time each day.  If you have difficulty swallowing the tablet whole please discuss with your pharmacist how to take the medication safely.  Take Eliquis exactly as prescribed by your doctor and DO NOT stop taking Eliquis without talking to the doctor who prescribed the medication.  Stopping without other medication to take the place of Eliquis may increase your risk of developing a clot.  After discharge, you should have regular check-up appointments with your healthcare provider that is prescribing your Eliquis.  What do you do if you miss a dose? If a dose of ELIQUIS is not taken at the scheduled time, take it as soon as possible on the same day and twice-daily administration should be resumed.  The dose should not be doubled to make up for a missed dose.  Do not take more than one tablet of ELIQUIS at the same time.  Important Safety Information A possible side effect of Eliquis is bleeding. You should call your healthcare provider right away if you experience any of the following: ? Bleeding from an injury or your nose that does not stop. ? Unusual colored urine (red or dark brown) or unusual colored stools (red or black). ? Unusual bruising for unknown reasons. ? A serious fall or if you hit your head (even if there is no bleeding).  Some medicines may interact with Eliquis and might increase your risk of bleeding or clotting while on Eliquis. To help avoid this, consult your  healthcare provider or pharmacist prior to using any new prescription or non-prescription medications, including herbals, vitamins, non-steroidal anti-inflammatory drugs (NSAIDs) and supplements.  This website has more information on Eliquis (apixaban): http://www.eliquis.com/eliquis/homeIt is very important for you to Elevate your leg - Toes above nose as much as possible to reduce pain / swelling.  Resume Aspirin after your course of Eliquis has finished.  Weight Bearing:  Non weight bearing affected leg.  Diet: As you were doing prior to hospitalization   Shower:  You have a splint on, leave the splint in place and keep the splint dry with a plastic bag.  Dressing:  You have a splint. Leave the splint in place and we will change your bandages during your first follow-up appointment.  You may loosen and re-apply ace wrap if it feels too tight.    Activity:  Increase activity slowly as tolerated, but follow the weight bearing instructions below.  The rules on driving is that you can not be taking narcotics while you drive, and you must feel in control of the vehicle.    Pain: For severe breakthrough pain, you may increase oxycodone for the first few days post op to 2 tablets every 4 hours.  Stop this medicine as soon as you are able.  To prevent constipation:  Narcotic medicines cause constipation.  Stop these as soon as possible.   You may use a stool softener such as -  Colace (over the counter) 100 mg by mouth twice  a day  Drink plenty of fluids (prune juice may be helpful) and high fiber foods Miralax (over the counter) for constipation as needed.    Itching:  If you experience itching with your medications, try taking only a single pain pill, or even half a pain pill at a time.  You can also use benadryl over the counter for itching or also to help with sleep.   Precautions:  If you experience chest pain or shortness of breath - call 911 immediately for transfer to the hospital  emergency department!!  If you develop a fever greater that 101 F, purulent drainage from wound, increased redness or drainage from wound, or calf pain -- Call the office at [phone]                                                 Follow- Up Appointment:  Please call for an appointment to be seen in 1-2 weeks [place] - [phone]

## 2019-08-27 NOTE — Anesthesia Preprocedure Evaluation (Signed)
Anesthesia Evaluation  Patient identified by MRN, date of birth, ID band Patient awake    Reviewed: Allergy & Precautions, NPO status , Patient's Chart, lab work & pertinent test results  Airway Mallampati: II  TM Distance: >3 FB     Dental   Pulmonary    breath sounds clear to auscultation       Cardiovascular hypertension,  Rhythm:Regular Rate:Normal     Neuro/Psych    GI/Hepatic   Endo/Other  diabetes  Renal/GU      Musculoskeletal   Abdominal   Peds  Hematology   Anesthesia Other Findings   Reproductive/Obstetrics                             Anesthesia Physical Anesthesia Plan  ASA: III  Anesthesia Plan: General   Post-op Pain Management:  Regional for Post-op pain   Induction: Intravenous  PONV Risk Score and Plan:   Airway Management Planned: LMA and Oral ETT  Additional Equipment:   Intra-op Plan:   Post-operative Plan: Extubation in OR  Informed Consent: I have reviewed the patients History and Physical, chart, labs and discussed the procedure including the risks, benefits and alternatives for the proposed anesthesia with the patient or authorized representative who has indicated his/her understanding and acceptance.     Dental advisory given  Plan Discussed with: CRNA and Anesthesiologist  Anesthesia Plan Comments:         Anesthesia Quick Evaluation

## 2019-08-27 NOTE — Interval H&P Note (Signed)
I participated in the care of this patient and agree with the above history, physical and evaluation. I performed a review of the history and a physical exam as detailed   Timothy Daniel Murphy MD  

## 2019-08-27 NOTE — Transfer of Care (Signed)
Immediate Anesthesia Transfer of Care Note  Patient: Wendy Barber  Procedure(s) Performed: OPEN REDUCTION INTERNAL FIXATION RIGHT ANKLE FRACTURE (Right Ankle)  Patient Location: PACU  Anesthesia Type:GA combined with regional for post-op pain  Level of Consciousness: awake, alert  and oriented  Airway & Oxygen Therapy: Patient Spontanous Breathing and Patient connected to face mask oxygen  Post-op Assessment: Report given to RN and Post -op Vital signs reviewed and stable  Post vital signs: Reviewed and stable  Last Vitals:  Vitals Value Taken Time  BP 138/100 08/27/19 1247  Temp    Pulse 70 08/27/19 1249  Resp 11 08/27/19 1249  SpO2 100 % 08/27/19 1249  Vitals shown include unvalidated device data.  Last Pain:  Vitals:   08/27/19 1012  TempSrc:   PainSc: 7       Patients Stated Pain Goal: 4 ([id number])  Complications: No apparent anesthesia complications

## 2019-08-27 NOTE — Anesthesia Procedure Notes (Signed)
Procedure Name: LMA Insertion Date/Time: 08/27/2019 11:40 AM Performed by: Genelle Bal, CRNA Pre-anesthesia Checklist: Patient identified, Emergency Drugs available, Suction available and Patient being monitored Patient Re-evaluated:Patient Re-evaluated prior to induction Oxygen Delivery Method: Circle system utilized Preoxygenation: Pre-oxygenation with 100% oxygen Induction Type: IV induction Ventilation: Mask ventilation without difficulty LMA: LMA inserted LMA Size: 4.0 Number of attempts: 1 Airway Equipment and Method: Bite block Placement Confirmation: positive ETCO2 Tube secured with: Tape Dental Injury: Teeth and Oropharynx as per pre-operative assessment

## 2019-08-28 DIAGNOSIS — S82861A Displaced Maisonneuve's fracture of right leg, initial encounter for closed fracture: Secondary | ICD-10-CM | POA: Diagnosis not present

## 2019-08-28 LAB — GLUCOSE, CAPILLARY: Glucose-Capillary: 121 mg/dL — ABNORMAL HIGH (ref 70–99)

## 2019-08-28 NOTE — Discharge Summary (Signed)
Discharge Summary  Patient ID: Wendy Barber MRN: [id number] DOB/AGE: April 14, 1961 58 y.o.  Admit date: 08/27/2019 Discharge date: 08/28/2019  Admission Diagnoses:  <principal problem not specified>  Discharge Diagnoses:  Active Problems:   Closed right ankle fracture   Past Medical History:  Diagnosis Date  . Arthritis   . Back pain   . Cancer [facility])    pseudomyxoma peritonei  . Chronic fatigue syndrome   . Diabetes mellitus without complication ([place])   . Fibromyalgia   . Hypertension     Surgeries: Procedure(s): OPEN REDUCTION INTERNAL FIXATION RIGHT ANKLE FRACTURE on 08/27/2019   Consultants (if any):   Discharged Condition: Improved  Hospital Course: Wendy Barber is an 58 y.o. female who was admitted 08/27/2019 with a diagnosis of <principal problem not specified> and went to the operating room on 08/27/2019 and underwent the above named procedures.    She was given perioperative antibiotics:  Anti-infectives (From admission, onward)   Start     Dose/Rate Route Frequency Ordered Stop   08/27/19 2200  nitrofurantoin (MACRODANTIN) capsule 50 mg     50 mg Oral Daily at bedtime 08/27/19 1450     08/27/19 1800  clindamycin (CLEOCIN) IVPB 600 mg     600 mg 100 mL/hr over 30 Minutes Intravenous Every 6 hours 08/27/19 1450 08/28/19 0607   08/27/19 0945  clindamycin (CLEOCIN) IVPB 900 mg     900 mg 100 mL/hr over 30 Minutes Intravenous On call to O.R. 08/27/19 [id number] 08/27/19 1207    .  She was given sequential compression devices, early ambulation, and Eliquis for DVT prophylaxis.  She benefited maximally from the hospital stay and there were no complications.    Recent vital signs:  Vitals:   08/28/19 0058 08/28/19 0556  BP: 127/88 129/82  Pulse: 67 65  Resp: 19 19  Temp: 98.5 F (36.9 C) 98.1 F (36.7 C)  SpO2: 95% 97%    Recent laboratory studies:  Lab Results  Component Value Date   HGB 12.5 08/22/2019   HGB 13.8 10/24/2014   Lab Results  Component  Value Date   WBC 9.6 08/22/2019   PLT 378 08/22/2019   No results found for: INR Lab Results  Component Value Date   NA 136 08/22/2019   K 3.6 08/22/2019   CL 103 08/22/2019   CO2 23 08/22/2019   BUN 34 (H) 08/22/2019   CREATININE 0.89 08/22/2019   GLUCOSE 128 (H) 08/22/2019    Discharge Medications:   Allergies as of 08/28/2019      Reactions   Penicillins Shortness Of Breath   Alprazolam Other (See Comments), Hives   unresponsive Hard to arouse unresponsive Hard to arouse   Ativan [lorazepam] Other (See Comments)   Face & Throat Swelling.   Betadine [povidone Iodine]    Had rash under bandage after surgery , has had betadine since without reaction    Codeine    Corticosteroids Other (See Comments)   Other reaction(s): Other (see comments) Psychotic behaivor Other reaction(s): Other (See Comments) Psychotic behaivor Psychotic behaivor Psychotic behaivor   Erythromycin Base Hives   Milnacipran Hcl Other (See Comments)   Other reaction(s): Other (see comments) psychotic episode psychotic episode   Prednisolone    Prednisone Other (See Comments)   Anxiety & Nervous Breakdown.      Medication List    STOP taking these medications   aspirin 81 MG tablet     TAKE these medications   acetaminophen 500 MG tablet Commonly known as: TYLENOL  Take 2 tablets (1,000 mg total) by mouth every 8 (eight) hours for 10 days. For Pain. What changed:   when to take this  reasons to take this  additional instructions   amLODIPine-Valsartan-HCTZ 5-160-12.5 MG Tabs Take 1 tablet by mouth every morning.   apixaban 2.5 MG Tabs tablet Commonly known as: ELIQUIS Take 1 tablet (2.5 mg total) by mouth 2 (two) times daily. For postoperative DVT prophylaxis   busPIRone 5 MG tablet Commonly known as: BUSPAR Take 5 mg by mouth 3 (three) times daily as needed (anxiety).   celecoxib 200 MG capsule Commonly known as: CeleBREX Take 1 capsule (200 mg total) by mouth 2 (two)  times daily for 14 days. For 2 weeks post op for pain and inflammation.  Discontinue Ibuprofen or other Anti-inflammatory medicine when taking this medicine.   Co Q-10 100 MG Caps Take 1 capsule by mouth at bedtime.   DULoxetine 60 MG capsule Commonly known as: CYMBALTA Take 60 mg by mouth 2 (two) times daily.   ferrous sulfate 325 (65 FE) MG EC tablet Take 325 mg by mouth 3 (three) times a week.   insulin glargine 100 UNIT/ML injection Commonly known as: LANTUS Inject 25 Units into the skin at bedtime. Before bed   insulin lispro 100 UNIT/ML injection Commonly known as: HUMALOG Inject 12-14 Units into the skin 3 (three) times daily before meals.   Jardiance 25 MG Tabs tablet Generic drug: empagliflozin Take 12.5 mg by mouth daily.   metFORMIN 500 MG tablet Commonly known as: GLUCOPHAGE Take 1,000 mg by mouth 2 (two) times daily with a meal.   methocarbamol 500 MG tablet Commonly known as: Robaxin Take 1 tablet (500 mg total) by mouth every 8 (eight) hours as needed for muscle spasms.   nitrofurantoin 50 MG capsule Commonly known as: MACRODANTIN Take 50 mg by mouth at bedtime.   omeprazole 40 MG capsule Commonly known as: PRILOSEC Take 40 mg by mouth at bedtime.   ondansetron 4 MG tablet Commonly known as: Zofran Take 1 tablet (4 mg total) by mouth every 8 (eight) hours as needed for nausea or vomiting.   oxyCODONE 5 MG immediate release tablet Commonly known as: Roxicodone Take 1 tablet (5 mg total) by mouth every 4 (four) hours as needed for breakthrough pain. What changed:   when to take this  reasons to take this   pregabalin 150 MG capsule Commonly known as: LYRICA Take 150-300 mg by mouth 2 (two) times daily. Take 150 mg in am & Take 300 mg in pm.   propranolol 20 MG tablet Commonly known as: INDERAL Take 20 mg by mouth 2 (two) times daily.   Vitamin D 50 MCG (2000 UT) tablet Take 2,000 Units by mouth daily.       Diagnostic Studies: Dg Ankle  Complete Right  Result Date: 08/22/2019 CLINICAL DATA:  Right ankle pain after fall. EXAM: RIGHT ANKLE - COMPLETE 3+ VIEW COMPARISON:  None. FINDINGS: Probable minimally displaced fracture is seen involving the posterior malleolus of the distal tibia. No other acute fracture is noted. There is some widening of the medial portion of the talotibial joint suggesting ligamentous injury. IMPRESSION: Probable minimally displaced posterior malleolar fracture of distal tibia is noted. Mild widening of medial portion of talotibial joint is noted suggesting ligamentous injury. Electronically Signed   By: Marijo Conception M.D.   On: 08/22/2019 13:18   Ct Head Wo Contrast  Result Date: 08/22/2019 CLINICAL DATA:  Syncopal episode resulting in a  fall down steps today with her head at the bottom of the steps. EXAM: CT HEAD WITHOUT CONTRAST TECHNIQUE: Contiguous axial images were obtained from the base of the skull through the vertex without intravenous contrast. COMPARISON:  None. FINDINGS: Brain: Normal appearing cerebral hemispheres and posterior fossa structures. Normal size and position of the ventricles. No intracranial hemorrhage, mass lesion or CT evidence of acute infarction. Vascular: No hyperdense vessel or unexpected calcification. Skull: Bilateral hyperostosis frontalis. Small left occipital exostosis. No fractures. Sinuses/Orbits: Unremarkable. Other: None. IMPRESSION: No acute abnormality. Electronically Signed   By: Claudie Revering M.D.   On: 08/22/2019 13:00   Nm Gastric Emptying  Result Date: 08/20/2019 CLINICAL DATA:  Nausea, vomiting, bloating, reflux and early satiety after eating since October 2019, diagnosed with diabetes mellitus in June 2020, has nausea and vomiting after waking up in morning and stay sick all day EXAM: NUCLEAR MEDICINE GASTRIC EMPTYING SCAN TECHNIQUE: After oral ingestion of radiolabeled meal, sequential abdominal images were obtained for 4 hours. Percentage of activity emptying  the stomach was calculated at 1 hour, 2 hour, 3 hour, and 4 hours. RADIOPHARMACEUTICALS:  2.01 mCi Tc-9m sulfur colloid in standardized meal COMPARISON:  None FINDINGS: Expected location of the stomach in the left upper quadrant. Ingested meal empties the stomach gradually over the course of the study. 25% emptied at 1 hr ( normal >= 10%) 40% emptied at 2 hr ( normal >= 40%) 89% emptied at 3 hr ( normal >= 70%) 95% emptied at 4 hr ( normal >= 90%) IMPRESSION: Normal gastric emptying study. Electronically Signed   By: Lavonia Dana M.D.   On: 08/20/2019 13:33   Dg Foot Complete Right  Result Date: 08/22/2019 CLINICAL DATA:  Right foot pain after fall today. EXAM: RIGHT FOOT COMPLETE - 3+ VIEW COMPARISON:  None. FINDINGS: As noted on ankle radiographs of same day, minimally displaced posterior malleolar fracture of distal right tibia is noted. No other fracture or dislocation is noted. Moderate posterior calcaneal spurring is noted. Soft tissues are unremarkable. Joint spaces are intact. IMPRESSION: Minimally displaced posterior malleolar fracture of distal right tibia. No other significant abnormality seen in the right foot. Electronically Signed   By: Marijo Conception M.D.   On: 08/22/2019 13:20    Disposition: Discharge disposition: 01-Home or Self Care       Discharge Instructions    Discharge patient   Complete by: As directed    Discharge disposition: 01-Home or Self Care   Discharge patient date: 08/28/2019      Follow-up Information    Renette Butters, MD.   Specialty: Orthopedic Surgery Contact information: [address] [place] [address] [phone]            Signed: Prudencio Burly III PA-C 08/28/2019, 7:57 AM

## 2019-08-28 NOTE — Progress Notes (Signed)
    Subjective: Patient reports pain as mild, controlled-nerve block still of active.  Tolerating diet.  Urinating.  She has a knee scooter and feels confident in her ability to safely mobilize and adhere to weightbearing restriction without the need for PT or OT evaluation.  Objective:   VITALS:   Vitals:   08/27/19 1740 08/27/19 2040 08/28/19 0058 08/28/19 0556  BP: (!) 143/86 (!) 143/91 127/88 129/82  Pulse: 71 75 67 65  Resp: 16 17 19 19   Temp: 97.8 F (36.6 C) 98 F (36.7 C) 98.5 F (36.9 C) 98.1 F (36.7 C)  TempSrc: Oral     SpO2: 99% 93% 95% 97%  Weight:      Height:       CBC Latest Ref Rng & Units 08/22/2019 10/24/2014  WBC 4.0 - 10.5 K/uL 9.6 10.4  Hemoglobin 12.0 - 15.0 g/dL 12.5 13.8  Hematocrit 36.0 - 46.0 % 39.1 44.0  Platelets 150 - 400 K/uL 378 440(H)   BMP Latest Ref Rng & Units 08/22/2019 10/24/2014  Glucose 70 - 99 mg/dL 128(H) 69(L)  BUN 6 - 20 mg/dL 34(H) 29(H)  Creatinine 0.44 - 1.00 mg/dL 0.89 0.94  Sodium 135 - 145 mmol/L 136 139  Potassium 3.5 - 5.1 mmol/L 3.6 3.9  Chloride 98 - 111 mmol/L 103 102  CO2 22 - 32 mmol/L 23 25  Calcium 8.9 - 10.3 mg/dL 9.5 9.3   Intake/Output      11/03 0701 - 11/04 0700 11/04 0701 - 11/05 0700   P.O. 480    I.V. (mL/kg) 2900.7 (24.7)    IV Piggyback 149.3    Total Intake(mL/kg) 3530 (30)    Urine (mL/kg/hr) 1200    Stool 0    Blood 5    Total Output 1205    Net +2325         Urine Occurrence 1 x    Stool Occurrence 1 x       Physical Exam: General: NAD.  Supine in bed.  Calm, conversant. Resp: No increased wob Cardio: regular rate and rhythm ABD soft Neurologically intact MSK RLE: Elevated on pillows. Sensation intact distally at toes Toes warm Splint intact and in good condition   Assessment: 1 Day Post-Op  S/P Procedure(s) (LRB): OPEN REDUCTION INTERNAL FIXATION RIGHT ANKLE FRACTURE (Right) by Dr. Ernesta Amble. Percell Miller on 08/27/2019  Active Problems:   Closed right ankle fracture   Closed  right ankle fracture status post ORIF Doing well postop day 1 Tolerating diet and voiding Pain controlled Confident in her ability to safely mobilize and maintain weightbearing restrictions.   History of DVT/PE Ready for discharge  Plan: Incentive Spirometry Elevate Apply ice as needed  Weightbearing: NWB RLE Insicional and dressing care: Splint and Dressings left intact until follow-up Orthopedic device(s): Splint Showering: Keep dressing dry VTE prophylaxis:  Eliquis twice daily for 30 days, SCDs, ambulation Pain control: Continue current regimen  Contact information:  Edmonia Lynch MD, Roxan Hockey PA-C  Dispo: Home today   Wendy Elizabeth Yeoman III, PA-C 08/28/2019, 7:51 AM

## 2019-08-28 NOTE — Anesthesia Postprocedure Evaluation (Signed)
Anesthesia Post Note  Patient: Wendy Barber  Procedure(s) Performed: OPEN REDUCTION INTERNAL FIXATION RIGHT ANKLE FRACTURE (Right Ankle)     Patient location during evaluation: PACU Anesthesia Type: General Level of consciousness: awake Cardiovascular status: blood pressure returned to baseline Postop Assessment: no apparent nausea or vomiting Anesthetic complications: no    Last Vitals:  Vitals:   08/28/19 0556 08/28/19 0921  BP: 129/82 (!) 159/99  Pulse: 65 60  Resp: 19 18  Temp: 36.7 C (!) 36.4 C  SpO2: 97% 100%    Last Pain:  Vitals:   08/28/19 0921  TempSrc: Axillary  PainSc:                  Wendy Barber

## 2019-08-28 NOTE — Plan of Care (Signed)
  Problem: Education: Goal: Knowledge of General Education information will improve Description: Including pain rating scale, medication(s)/side effects and non-pharmacologic comfort measures Outcome: Progressing   Problem: Clinical Measurements: Goal: Respiratory complications will improve Outcome: Progressing Goal: Cardiovascular complication will be avoided Outcome: Progressing   Problem: Activity: Goal: Risk for activity intolerance will decrease Outcome: Progressing   Problem: Elimination: Goal: Will not experience complications related to bowel motility Outcome: Progressing Goal: Will not experience complications related to urinary retention Outcome: Progressing   Problem: Pain Managment: Goal: General experience of comfort will improve Outcome: Progressing   Problem: Safety: Goal: Ability to remain free from injury will improve Outcome: Progressing   Problem: Clinical Measurements: Goal: Postoperative complications will be avoided or minimized Outcome: Progressing

## 2019-09-05 ENCOUNTER — Encounter (HOSPITAL_COMMUNITY): Payer: Self-pay | Admitting: Orthopedic Surgery

## 2019-10-28 ENCOUNTER — Encounter (INDEPENDENT_AMBULATORY_CARE_PROVIDER_SITE_OTHER): Payer: Self-pay

## 2019-10-28 ENCOUNTER — Other Ambulatory Visit: Payer: Self-pay

## 2019-10-28 ENCOUNTER — Ambulatory Visit (HOSPITAL_COMMUNITY)
Admission: RE | Admit: 2019-10-28 | Discharge: 2019-10-28 | Disposition: A | Discharge status: Home / Self Care | Payer: TRICARE For Life (TFL) | Source: Ambulatory Visit | Attending: Cardiology | Admitting: Cardiology

## 2019-10-28 ENCOUNTER — Other Ambulatory Visit (HOSPITAL_COMMUNITY): Payer: Self-pay | Admitting: Orthopedic Surgery

## 2019-10-28 DIAGNOSIS — M79669 Pain in unspecified lower leg: Secondary | ICD-10-CM

## 2019-10-28 DIAGNOSIS — M7989 Other specified soft tissue disorders: Secondary | ICD-10-CM | POA: Insufficient documentation

## 2019-11-21 ENCOUNTER — Inpatient Hospital Stay (HOSPITAL_COMMUNITY)
Admission: EM | Admit: 2019-11-21 | Discharge: 2019-11-26 | DRG: 442 | Disposition: A | Discharge status: Home / Self Care | Payer: Medicare Other | Attending: Family Medicine | Admitting: Family Medicine

## 2019-11-21 ENCOUNTER — Encounter (HOSPITAL_COMMUNITY): Payer: Self-pay | Admitting: Emergency Medicine

## 2019-11-21 ENCOUNTER — Other Ambulatory Visit: Payer: Self-pay

## 2019-11-21 ENCOUNTER — Emergency Department (HOSPITAL_COMMUNITY): Payer: Medicare Other

## 2019-11-21 DIAGNOSIS — Z885 Allergy status to narcotic agent status: Secondary | ICD-10-CM

## 2019-11-21 DIAGNOSIS — R410 Disorientation, unspecified: Secondary | ICD-10-CM | POA: Diagnosis present

## 2019-11-21 DIAGNOSIS — D509 Iron deficiency anemia, unspecified: Secondary | ICD-10-CM | POA: Diagnosis present

## 2019-11-21 DIAGNOSIS — K754 Autoimmune hepatitis: Principal | ICD-10-CM | POA: Diagnosis present

## 2019-11-21 DIAGNOSIS — Z9221 Personal history of antineoplastic chemotherapy: Secondary | ICD-10-CM

## 2019-11-21 DIAGNOSIS — Z9181 History of falling: Secondary | ICD-10-CM

## 2019-11-21 DIAGNOSIS — Z20822 Contact with and (suspected) exposure to covid-19: Secondary | ICD-10-CM | POA: Diagnosis present

## 2019-11-21 DIAGNOSIS — K7682 Hepatic encephalopathy: Secondary | ICD-10-CM | POA: Diagnosis present

## 2019-11-21 DIAGNOSIS — I10 Essential (primary) hypertension: Secondary | ICD-10-CM

## 2019-11-21 DIAGNOSIS — E119 Type 2 diabetes mellitus without complications: Secondary | ICD-10-CM

## 2019-11-21 DIAGNOSIS — F319 Bipolar disorder, unspecified: Secondary | ICD-10-CM | POA: Diagnosis present

## 2019-11-21 DIAGNOSIS — M1990 Unspecified osteoarthritis, unspecified site: Secondary | ICD-10-CM | POA: Diagnosis present

## 2019-11-21 DIAGNOSIS — K5900 Constipation, unspecified: Secondary | ICD-10-CM | POA: Diagnosis present

## 2019-11-21 DIAGNOSIS — D320 Benign neoplasm of cerebral meninges: Secondary | ICD-10-CM | POA: Diagnosis present

## 2019-11-21 DIAGNOSIS — S82891A Other fracture of right lower leg, initial encounter for closed fracture: Secondary | ICD-10-CM | POA: Diagnosis present

## 2019-11-21 DIAGNOSIS — Z794 Long term (current) use of insulin: Secondary | ICD-10-CM

## 2019-11-21 DIAGNOSIS — Z8509 Personal history of malignant neoplasm of other digestive organs: Secondary | ICD-10-CM

## 2019-11-21 DIAGNOSIS — M797 Fibromyalgia: Secondary | ICD-10-CM | POA: Diagnosis present

## 2019-11-21 DIAGNOSIS — K7210 Chronic hepatic failure without coma: Secondary | ICD-10-CM | POA: Diagnosis present

## 2019-11-21 DIAGNOSIS — Z79899 Other long term (current) drug therapy: Secondary | ICD-10-CM

## 2019-11-21 DIAGNOSIS — G250 Essential tremor: Secondary | ICD-10-CM | POA: Diagnosis present

## 2019-11-21 DIAGNOSIS — Z9081 Acquired absence of spleen: Secondary | ICD-10-CM

## 2019-11-21 DIAGNOSIS — K7290 Hepatic failure, unspecified without coma: Secondary | ICD-10-CM | POA: Diagnosis present

## 2019-11-21 DIAGNOSIS — F419 Anxiety disorder, unspecified: Secondary | ICD-10-CM | POA: Diagnosis present

## 2019-11-21 DIAGNOSIS — Z880 Allergy status to penicillin: Secondary | ICD-10-CM

## 2019-11-21 DIAGNOSIS — Z882 Allergy status to sulfonamides status: Secondary | ICD-10-CM

## 2019-11-21 DIAGNOSIS — Z791 Long term (current) use of non-steroidal anti-inflammatories (NSAID): Secondary | ICD-10-CM

## 2019-11-21 DIAGNOSIS — Z86718 Personal history of other venous thrombosis and embolism: Secondary | ICD-10-CM

## 2019-11-21 DIAGNOSIS — Z888 Allergy status to other drugs, medicaments and biological substances status: Secondary | ICD-10-CM

## 2019-11-21 DIAGNOSIS — Z95828 Presence of other vascular implants and grafts: Secondary | ICD-10-CM

## 2019-11-21 DIAGNOSIS — Z90710 Acquired absence of both cervix and uterus: Secondary | ICD-10-CM

## 2019-11-21 DIAGNOSIS — Z9049 Acquired absence of other specified parts of digestive tract: Secondary | ICD-10-CM

## 2019-11-21 DIAGNOSIS — Z86711 Personal history of pulmonary embolism: Secondary | ICD-10-CM

## 2019-11-21 DIAGNOSIS — Z6841 Body Mass Index (BMI) 40.0 and over, adult: Secondary | ICD-10-CM

## 2019-11-21 DIAGNOSIS — R4182 Altered mental status, unspecified: Secondary | ICD-10-CM | POA: Diagnosis present

## 2019-11-21 DIAGNOSIS — T451X5A Adverse effect of antineoplastic and immunosuppressive drugs, initial encounter: Secondary | ICD-10-CM | POA: Diagnosis present

## 2019-11-21 LAB — URINALYSIS, ROUTINE W REFLEX MICROSCOPIC
Bilirubin Urine: NEGATIVE
Glucose, UA: NEGATIVE mg/dL
Hgb urine dipstick: NEGATIVE
Ketones, ur: NEGATIVE mg/dL
Leukocytes,Ua: NEGATIVE
Nitrite: POSITIVE — AB
Protein, ur: NEGATIVE mg/dL
Specific Gravity, Urine: 1.023 (ref 1.005–1.030)
pH: 6 (ref 5.0–8.0)

## 2019-11-21 LAB — GLUCOSE, CAPILLARY
Glucose-Capillary: 143 mg/dL — ABNORMAL HIGH (ref 70–99)
Glucose-Capillary: 161 mg/dL — ABNORMAL HIGH (ref 70–99)

## 2019-11-21 LAB — COMPREHENSIVE METABOLIC PANEL
ALT: 74 U/L — ABNORMAL HIGH (ref 0–44)
AST: 146 U/L — ABNORMAL HIGH (ref 15–41)
Albumin: 2.7 g/dL — ABNORMAL LOW (ref 3.5–5.0)
Alkaline Phosphatase: 218 U/L — ABNORMAL HIGH (ref 38–126)
Anion gap: 9 (ref 5–15)
BUN: 24 mg/dL — ABNORMAL HIGH (ref 6–20)
CO2: 27 mmol/L (ref 22–32)
Calcium: 9 mg/dL (ref 8.9–10.3)
Chloride: 101 mmol/L (ref 98–111)
Creatinine, Ser: 0.71 mg/dL (ref 0.44–1.00)
GFR calc Af Amer: >60 mL/min (ref >60)
GFR calc non Af Amer: >60 mL/min (ref >60)
Glucose, Bld: 137 mg/dL — ABNORMAL HIGH (ref 70–99)
Potassium: 4.5 mmol/L (ref 3.5–5.1)
Sodium: 137 mmol/L (ref 135–145)
Total Bilirubin: 5.8 mg/dL — ABNORMAL HIGH (ref 0.3–1.2)
Total Protein: 7.8 g/dL (ref 6.5–8.1)

## 2019-11-21 LAB — BLOOD GAS, VENOUS
Acid-Base Excess: 4.5 mmol/L — ABNORMAL HIGH (ref 0.0–2.0)
Bicarbonate: 30 mmol/L — ABNORMAL HIGH (ref 20.0–28.0)
O2 Saturation: 60.3 %
Patient temperature: 98.6
pCO2, Ven: 51.7 mmHg (ref 44.0–60.0)
pH, Ven: 7.383 (ref 7.250–7.430)
pO2, Ven: 34.2 mmHg (ref 32.0–45.0)

## 2019-11-21 LAB — AMMONIA: Ammonia: 50 umol/L — ABNORMAL HIGH (ref 9–35)

## 2019-11-21 LAB — CBC
HCT: 31.1 % — ABNORMAL LOW (ref 36.0–46.0)
Hemoglobin: 10.3 g/dL — ABNORMAL LOW (ref 12.0–15.0)
MCH: 32.3 pg (ref 26.0–34.0)
MCHC: 33.1 g/dL (ref 30.0–36.0)
MCV: 97.5 fL (ref 80.0–100.0)
Platelets: 384 10*3/uL (ref 150–400)
RBC: 3.19 MIL/uL — ABNORMAL LOW (ref 3.87–5.11)
RDW: 24.9 % — ABNORMAL HIGH (ref 11.5–15.5)
WBC: 6.5 10*3/uL (ref 4.0–10.5)
nRBC: 3.8 % — ABNORMAL HIGH (ref 0.0–0.2)

## 2019-11-21 LAB — BILIRUBIN, DIRECT: Bilirubin, Direct: 3 mg/dL — ABNORMAL HIGH (ref 0.0–0.2)

## 2019-11-21 LAB — I-STAT BETA HCG BLOOD, ED (MC, WL, AP ONLY): I-stat hCG, quantitative: <5 m[IU]/mL (ref <5)

## 2019-11-21 LAB — CBG MONITORING, ED: Glucose-Capillary: 115 mg/dL — ABNORMAL HIGH (ref 70–99)

## 2019-11-21 LAB — SARS CORONAVIRUS 2 (TAT 6-24 HRS): SARS Coronavirus 2: NEGATIVE

## 2019-11-21 LAB — LIPASE, BLOOD: Lipase: 38 U/L (ref 11–51)

## 2019-11-21 LAB — HIV ANTIBODY (ROUTINE TESTING W REFLEX): HIV Screen 4th Generation wRfx: NONREACTIVE

## 2019-11-21 MED ORDER — SODIUM CHLORIDE 0.9% FLUSH
3.0000 mL | Freq: Once | INTRAVENOUS | Status: AC
  Administered 2019-11-21: 3 mL via INTRAVENOUS

## 2019-11-21 MED ORDER — INSULIN ASPART 100 UNIT/ML ~~LOC~~ SOLN
0.0000 [IU] | Freq: Every day | SUBCUTANEOUS | Status: DC
  Filled 2019-11-21: qty 0.05

## 2019-11-21 MED ORDER — PROPRANOLOL HCL 20 MG PO TABS
20.0000 mg | ORAL_TABLET | Freq: Two times a day (BID) | ORAL | Status: DC
  Administered 2019-11-21 – 2019-11-26 (×11): 20 mg via ORAL
  Filled 2019-11-21 (×13): qty 1

## 2019-11-21 MED ORDER — ONDANSETRON HCL 4 MG PO TABS
4.0000 mg | ORAL_TABLET | Freq: Three times a day (TID) | ORAL | Status: DC | PRN
  Administered 2019-11-21 – 2019-11-23 (×3): 4 mg via ORAL
  Filled 2019-11-21 (×3): qty 1

## 2019-11-21 MED ORDER — LACTULOSE 10 GM/15ML PO SOLN
20.0000 g | Freq: Three times a day (TID) | ORAL | Status: DC
  Administered 2019-11-21 – 2019-11-24 (×10): 20 g via ORAL
  Filled 2019-11-21 (×10): qty 30

## 2019-11-21 MED ORDER — LACTULOSE 10 GM/15ML PO SOLN
30.0000 g | Freq: Once | ORAL | Status: AC
  Administered 2019-11-21: 30 g via ORAL
  Filled 2019-11-21: qty 60

## 2019-11-21 MED ORDER — PANTOPRAZOLE SODIUM 40 MG PO TBEC
40.0000 mg | DELAYED_RELEASE_TABLET | Freq: Every day | ORAL | Status: DC
  Administered 2019-11-21 – 2019-11-26 (×5): 40 mg via ORAL
  Filled 2019-11-21 (×6): qty 1

## 2019-11-21 MED ORDER — INSULIN ASPART 100 UNIT/ML ~~LOC~~ SOLN
0.0000 [IU] | Freq: Three times a day (TID) | SUBCUTANEOUS | Status: DC
  Administered 2019-11-21 (×2): 1 [IU] via SUBCUTANEOUS
  Administered 2019-11-22: 2 [IU] via SUBCUTANEOUS
  Administered 2019-11-22: 5 [IU] via SUBCUTANEOUS
  Administered 2019-11-22: 1 [IU] via SUBCUTANEOUS
  Administered 2019-11-23: 2 [IU] via SUBCUTANEOUS
  Administered 2019-11-23: 1 [IU] via SUBCUTANEOUS
  Administered 2019-11-23 – 2019-11-24 (×2): 2 [IU] via SUBCUTANEOUS
  Administered 2019-11-24: 1 [IU] via SUBCUTANEOUS
  Administered 2019-11-24: 3 [IU] via SUBCUTANEOUS
  Administered 2019-11-25: 1 [IU] via SUBCUTANEOUS
  Administered 2019-11-25: 2 [IU] via SUBCUTANEOUS
  Administered 2019-11-25: 1 [IU] via SUBCUTANEOUS
  Administered 2019-11-26: 2 [IU] via SUBCUTANEOUS
  Administered 2019-11-26: 1 [IU] via SUBCUTANEOUS
  Filled 2019-11-21: qty 0.09

## 2019-11-21 MED ORDER — FERROUS SULFATE 325 (65 FE) MG PO TABS
325.0000 mg | ORAL_TABLET | ORAL | Status: DC
  Administered 2019-11-22: 325 mg via ORAL
  Filled 2019-11-21: qty 1

## 2019-11-21 MED ORDER — AZATHIOPRINE 50 MG PO TABS
150.0000 mg | ORAL_TABLET | Freq: Every day | ORAL | Status: DC
  Administered 2019-11-21 – 2019-11-22 (×2): 150 mg via ORAL
  Filled 2019-11-21 (×2): qty 3

## 2019-11-21 MED ORDER — FERROUS SULFATE 325 (65 FE) MG PO TBEC
325.0000 mg | DELAYED_RELEASE_TABLET | ORAL | Status: DC
  Filled 2019-11-21: qty 1

## 2019-11-21 MED ORDER — ENOXAPARIN SODIUM 40 MG/0.4ML ~~LOC~~ SOLN
40.0000 mg | SUBCUTANEOUS | Status: AC
  Administered 2019-11-21 – 2019-11-24 (×4): 40 mg via SUBCUTANEOUS
  Filled 2019-11-21 (×4): qty 0.4

## 2019-11-21 NOTE — H&P (Addendum)
History and Physical    Wendy Barber [id number] DOB: [birth year] DOA: 11/21/2019  PCP: Glendon Axe, MD   Patient coming from:     Chief Complaint:   HPI: Wendy Barber is a 59 y.o. female with medical history significant of autoimmune hepatitis, recent right ankle fracture, hypertension, diabetes type 2, essential tremors who presented from home with complaints of confusion, forgetfulness, increased tremors.  History was obtained with the help of husband at the bedside.  As per the husband, the patient has been increasingly forgetful weak, lethargic, confused since 2 weeks.  Husband also noted that her eyes been yellow since 2 to 3 weeks. She has been diagnosed with autoimmune hepatitis and follows with Dr. [facility].  She takes azathioprine for autoimmune hepatitis. Patient seen and examined at the bedside in the emergency department.  Currently she is hemodynamically stable.  She is alert and oriented during my evaluation.  She denies any chest pain, shortness of breath, cough, fever, chills, nausea, vomiting or diarrhea.  She complained of vague abdominal discomfort.  She also has issues with constipation.  ED Course: As per the ED report, she was found to be confused on presentation.  Ammonia level was found to be 50.  CT head done in the emergency department showed meningioma, no acute intracranial abnormalities.  Elevated liver enzymes with AST of 146, ALT of 74.  T bili of 5.8  Review of Systems: As per HPI otherwise 10 point review of systems negative.    Past Medical History:  Diagnosis Date  . Arthritis   . Back pain   . Cancer [facility])    pseudomyxoma peritonei  . Chronic fatigue syndrome   . Diabetes mellitus without complication ([place])   . Fibromyalgia   . Hypertension     Past Surgical History:  Procedure Laterality Date  . ABDOMINAL HYSTERECTOMY    . ABDOMINAL SURGERY    . ACHILLES TENDON REPAIR    . APPENDECTOMY    . ARTHROPLASTY      . CARPAL TUNNEL RELEASE    . CHOLECYSTECTOMY    . JOINT REPLACEMENT    . KNEE ARTHROSCOPY    . ORIF ANKLE FRACTURE Right 08/27/2019   Procedure: OPEN REDUCTION INTERNAL FIXATION RIGHT ANKLE FRACTURE;  Surgeon: Renette Butters, MD;  Location: [facility];  Service: Orthopedics;  Laterality: Right;  . perineorrophy    . TONGUE BIOPSY       reports that she has never smoked. She has never used smokeless tobacco. She reports that she does not drink alcohol or use drugs.  Allergies  Allergen Reactions  . Codeine Shortness Of Breath and Palpitations  . Penicillins Shortness Of Breath    Other reaction(s): Irregular Heart Rate, Other (See Comments) Rapid heartrate   . Alprazolam Other (See Comments) and Hives    unresponsive Hard to arouse unresponsive Hard to arouse  Other reaction(s): Other (See Comments) unresponsive  . Ativan [Lorazepam] Other (See Comments)    Face & Throat Swelling.  . Corticosteroids Other (See Comments)    Other reaction(s): Other (see comments) Psychotic behaivor Other reaction(s): Other (See Comments) Psychotic behaivor Psychotic behaivor Psychotic behaivor   . Erythromycin     Other reaction(s): Other (See Comments) Severe stomach pain   . Erythromycin Base Hives  . Milnacipran Hcl Other (See Comments)    Other reaction(s): Other (see comments) psychotic episode psychotic episode  Other reaction(s): Other (See Comments) psychotic episode  . Prednisone Other (See Comments)    Anxiety &  Nervous Breakdown.  Ocie Cornfield  [Milnacipran]   . Povidone Iodine Rash    Had rash under bandage after surgery , has had betadine since without reaction   . Prednisolone Anxiety    History reviewed. No pertinent family history.   Prior to Admission medications   Medication Sig Start Date End Date Taking? Authorizing Provider  amLODIPine-Valsartan-HCTZ 5-160-12.5 MG TABS Take 1 tablet by mouth every morning.    Yes [provider]  Azathioprine  (AZASAN) 75 MG TABS Take 150 mg by mouth daily.   Yes [provider]  busPIRone (BUSPAR) 5 MG tablet Take 5 mg by mouth 3 (three) times daily as needed (anxiety).    Yes [provider]  celecoxib (CELEBREX) 200 MG capsule Take 200 mg by mouth daily.   Yes [provider]  Cholecalciferol (VITAMIN D) 2000 UNITS tablet Take 2,000 Units by mouth daily.   Yes [provider]  Coenzyme Q10 (CO Q-10) 100 MG CAPS Take 1 capsule by mouth at bedtime.   Yes [provider]  Dulaglutide (TRULICITY) A999333 [id number] SOPN Inject 0.75 mg into the skin once a week. Tuesday   Yes [provider]  DULoxetine (CYMBALTA) 60 MG capsule Take 60 mg by mouth 2 (two) times daily.   Yes [provider]  ferrous sulfate 325 (65 FE) MG EC tablet Take 325 mg by mouth 3 (three) times a week.    Yes [provider]  insulin glargine (LANTUS) 100 UNIT/ML injection Inject 22 Units into the skin at bedtime. Before bed    Yes [provider]  insulin lispro (HUMALOG) 100 UNIT/ML injection Inject 12-14 Units into the skin 3 (three) times daily before meals.    Yes [provider]  nitrofurantoin (MACRODANTIN) 50 MG capsule Take 50 mg by mouth at bedtime.    Yes [provider]  omeprazole (PRILOSEC) 40 MG capsule Take 40 mg by mouth at bedtime.   Yes [provider]  ondansetron (ZOFRAN) 4 MG tablet Take 1 tablet (4 mg total) by mouth every 8 (eight) hours as needed for nausea or vomiting. Patient taking differently: Take 4 mg by mouth daily.  08/27/19  Yes Prudencio Burly III, PA-C  pregabalin (LYRICA) 150 MG capsule Take 150-300 mg by mouth See admin instructions. Take 150 mg in am & Take 300 mg in pm.    Yes [provider]  propranolol (INDERAL) 20 MG tablet Take 20 mg by mouth 2 (two) times daily.    Yes [provider]  metFORMIN (GLUCOPHAGE) 500 MG tablet Take 1,000 mg by mouth 2 (two) times daily  with a meal.     [provider]    Physical Exam: Vitals:   11/21/19 0530 11/21/19 0600 11/21/19 0630 11/21/19 0730  BP: 129/84 (!) 133/91 133/82 128/78  Pulse: 66 70 66 70  Resp: 12 13 14 16   Temp:      TempSrc:      SpO2: 97% 95% 95% 97%  Weight:      Height:        Constitutional: Not in distress, morbidly obese Vitals:   11/21/19 0530 11/21/19 0600 11/21/19 0630 11/21/19 0730  BP: 129/84 (!) 133/91 133/82 128/78  Pulse: 66 70 66 70  Resp: 12 13 14 16   Temp:      TempSrc:      SpO2: 97% 95% 95% 97%  Weight:      Height:       Eyes: PERRL, icterus  ENMT: Mucous membranes are moist. Neck: normal, supple, no masses, no thyromegaly Respiratory: clear to auscultation bilaterally, no wheezing, no crackles. Normal respiratory effort. No accessory muscle use.  Cardiovascular: Regular rate and rhythm, no murmurs / rubs / gallops. No extremity edema.  Abdomen: no tenderness, no masses palpated. No hepatosplenomegaly. Bowel sounds positive.  Musculoskeletal: no clubbing / cyanosis. No joint deformity upper and lower extremities. Good ROM, no contractures. Normal muscle tone.  Skin: no rashes, lesions, ulcers. No induration Neurologic: CN 2-12 grossly intact.  Strength 5/5 in all 4.  Alert and oriented    Foley Catheter:None  Labs on Admission: I have personally reviewed following labs and imaging studies  CBC: Recent Labs  Lab 11/21/19 0015  WBC 6.5  HGB 10.3*  HCT 31.1*  MCV 97.5  PLT 0000000   Basic Metabolic Panel: Recent Labs  Lab 11/21/19 0015  NA 137  K 4.5  CL 101  CO2 27  GLUCOSE 137*  BUN 24*  CREATININE 0.71  CALCIUM 9.0   GFR: Estimated Creatinine Clearance: 101.3 mL/min (by C-G formula based on SCr of 0.71 mg/dL). Liver Function Tests: Recent Labs  Lab 11/21/19 0015  AST 146*  ALT 74*  ALKPHOS 218*  BILITOT 5.8*  PROT 7.8  ALBUMIN 2.7*   Recent Labs  Lab 11/21/19 0015  LIPASE 38   Recent Labs  Lab 11/21/19 0231  AMMONIA  50*   Coagulation Profile: No results for input(s): INR, PROTIME in the last 168 hours. Cardiac Enzymes: No results for input(s): CKTOTAL, CKMB, CKMBINDEX, TROPONINI in the last 168 hours. BNP (last 3 results) No results for input(s): PROBNP in the last 8760 hours. HbA1C: No results for input(s): HGBA1C in the last 72 hours. CBG: Recent Labs  Lab 11/21/19 0309  GLUCAP 115*   Lipid Profile: No results for input(s): CHOL, HDL, LDLCALC, TRIG, CHOLHDL, LDLDIRECT in the last 72 hours. Thyroid Function Tests: No results for input(s): TSH, T4TOTAL, FREET4, T3FREE, THYROIDAB in the last 72 hours. Anemia Panel: No results for input(s): VITAMINB12, FOLATE, FERRITIN, TIBC, IRON, RETICCTPCT in the last 72 hours. Urine analysis:    Component Value Date/Time   COLORURINE YELLOW 10/24/2014 [place] 10/24/2014 1645   LABSPEC 1.014 10/24/2014 1645   PHURINE 6.0 10/24/2014 1645   GLUCOSEU NEGATIVE 10/24/2014 1645   HGBUR NEGATIVE 10/24/2014 1645   BILIRUBINUR NEGATIVE 10/24/2014 1645   KETONESUR NEGATIVE 10/24/2014 1645   PROTEINUR NEGATIVE 10/24/2014 1645   UROBILINOGEN 0.2 10/24/2014 1645   NITRITE NEGATIVE 10/24/2014 1645   LEUKOCYTESUR TRACE (A) 10/24/2014 1645    Radiological Exams on Admission: CT Head Wo Contrast  Result Date: 11/21/2019 CLINICAL DATA:  Weakness and uncontrolled jerking. EXAM: CT HEAD WITHOUT CONTRAST TECHNIQUE: Contiguous axial images were obtained from the base of the skull through the vertex without intravenous contrast. COMPARISON:  08/22/2019 FINDINGS: Brain: No evidence of acute infarction, hemorrhage, hydrocephalus, extra-axial collection. There is a dural-based calcification projecting inferiorly from the midline tentorium and measuring 13 mm, stable. Motion artifact towards the vertex and at the skull base. Vascular: No hyperdense vessel or unexpected calcification. Skull: Normal. Negative for fracture or focal lesion. Sinuses/Orbits: Negative  IMPRESSION: 1. Motion degraded head CT without acute finding. 2. 13 mm dural based calcification along the tentorium, suspect meningioma. Electronically Signed   By: Monte Fantasia M.D.   On: 11/21/2019 05:18     Assessment/Plan Principal Problem:   Confusion Active Problems:   Closed right ankle fracture   Autoimmune hepatitis ([place])  HTN (hypertension)   DM2 (diabetes mellitus, type 2) (HCC)   AMS (altered mental status)    Altered mental status: Most likely this is associated with elevated ammonia level.  Patient has history of autoimmune hepatitis.  Report of confusion, forgetfulness, lethargy which have been progressive since last 2 weeks. During my evaluation, she was alert and oriented.  Continue to monitor mental status.  CT head did not show any acute intracranial abnormalities  Elevated ammonia level/autoimmune hepatitis: Started on lactulose.  Patient also reported constipation.  Aim for 2-3 loose bowel movements a day.  Check ammonia level tomorrow.  She follows with Dr. Alan Ripper, gastroenterology at [facility].  She is on azathioprine.  She also has mild elevated liver enzymes.  Her total bili is elevated at 5, and she has mild icterus. We recommend to follow-up with her gastroenterologist as an outpatient.  History of essential tremors: On propanolol  Hypertension: Currently blood pressure stable.  Resume home medicines on discharge.  Diabetes type 2: On Trulicity, Lantus, Metformin at home.  Continue sliding scale insulin here.  Continue to monitor CBC.  Anxiety/depression: On buspirone, Cymbalta, pregabalin at home which are on hold.  History of peritoneal myxoma: On remission.  Status post resection on 2005      Severity of Illness: The appropriate patient status for this patient is OBSERVATION. O    DVT prophylaxis: Lovenox Code Status: Full Family Communication: Husband present at the bedside Consults called: None     Shelly Coss  MD [facility] Pager [phone]  If 7PM-7AM, please contact night-coverage [email or web address] Password [email or web address]  11/21/2019, 8:00 AM

## 2019-11-21 NOTE — ED Provider Notes (Signed)
[facility] Provider Note  CSN: [id number] Arrival date & time: 11/21/19 0010  Chief Complaint(s) Weakness and Abdominal Pain  HPI Wendy Barber is a 59 y.o. female with a past medical history listed below including peritoneal myxoma status post HIPEC, diabetes, reported autoimmune hepatitis who presents for 2 weeks of gradually worsening confusion and forgetfulness.  Patient is also had a new tremor.  No recent fevers or infections.  Patient has been complaining of nearly 1 yr of frequent nausea, emesis, and diarrhea. Patient's been having intermittent abdominal pain related to the diarrhea. No chest pain or shortness of breath.  No urinary symptoms.  Had recent labs on 1/11 with bili 2.4.  HPI  Past Medical History Past Medical History:  Diagnosis Date  . Arthritis   . Back pain   . Cancer [facility])    pseudomyxoma peritonei  . Chronic fatigue syndrome   . Diabetes mellitus without complication ([place])   . Fibromyalgia   . Hypertension    Patient Active Problem List   Diagnosis Date Noted  . Hepatic encephalopathy ([place]) 11/22/2019  . Confusion 11/21/2019  . Autoimmune hepatitis ([place]) 11/21/2019  . HTN (hypertension) 11/21/2019  . DM2 (diabetes mellitus, type 2) ([place]) 11/21/2019  . AMS (altered mental status) 11/21/2019  . Closed right ankle fracture 08/27/2019   Home Medication(s) Prior to Admission medications   Medication Sig Start Date End Date Taking? Authorizing Provider  amLODIPine-Valsartan-HCTZ 5-160-12.5 MG TABS Take 1 tablet by mouth every morning.    Yes [provider]  busPIRone (BUSPAR) 5 MG tablet Take 5 mg by mouth 3 (three) times daily as needed (anxiety).    Yes [provider]  Dulaglutide (TRULICITY) A999333 [id number] SOPN Inject 0.75 mg into the skin once a week. Tuesday   Yes [provider]  ferrous sulfate 325 (65 FE) MG EC tablet Take 325 mg by mouth 3 (three) times a week.    Yes [provider]    nitrofurantoin (MACRODANTIN) 50 MG capsule Take 50 mg by mouth at bedtime.    Yes [provider]  omeprazole (PRILOSEC) 40 MG capsule Take 40 mg by mouth at bedtime.   Yes [provider]  ondansetron (ZOFRAN) 4 MG tablet Take 1 tablet (4 mg total) by mouth every 8 (eight) hours as needed for nausea or vomiting. Patient taking differently: Take 4 mg by mouth daily.  08/27/19  Yes Prudencio Burly III, PA-C  propranolol (INDERAL) 20 MG tablet Take 20 mg by mouth 2 (two) times daily.    Yes [provider]  DULoxetine (CYMBALTA) 20 MG capsule Take 3 capsules (60 mg total) by mouth 2 (two) times daily. 11/26/19   Nita Sells, MD  insulin glargine (LANTUS) 100 UNIT/ML injection Inject 0.1 mLs (10 Units total) into the skin at bedtime. Before bed 11/26/19   Nita Sells, MD  insulin lispro (HUMALOG) 100 UNIT/ML injection Inject 0.08 mLs (8 Units total) into the skin 3 (three) times daily before meals. 11/26/19   Nita Sells, MD  lactulose (CHRONULAC) 10 GM/15ML solution Take 45 mLs (30 g total) by mouth 3 (three) times daily. 11/26/19   Nita Sells, MD  metFORMIN (GLUCOPHAGE) 500 MG tablet Take 1,000 mg by mouth 2 (two) times daily with a meal.     [provider]  pregabalin (LYRICA) 75 MG capsule Take 1 capsule daily until you see your primary 11/26/19   Nita Sells, MD  Past Surgical History Past Surgical History:  Procedure Laterality Date  . ABDOMINAL HYSTERECTOMY    . ABDOMINAL SURGERY    . ACHILLES TENDON REPAIR    . APPENDECTOMY    . ARTHROPLASTY    . CARPAL TUNNEL RELEASE    . CHOLECYSTECTOMY    . IR US GUIDE BX ASP/DRAIN  11/25/2019  . JOINT REPLACEMENT    . KNEE ARTHROSCOPY    . ORIF ANKLE FRACTURE Right 08/27/2019   Procedure: OPEN REDUCTION INTERNAL FIXATION RIGHT ANKLE FRACTURE;   Surgeon: Renette Butters, MD;  Location: [facility];  Service: Orthopedics;  Laterality: Right;  . perineorrophy    . TONGUE BIOPSY     Family History History reviewed. No pertinent family history.  Social History Social History   Tobacco Use  . Smoking status: Never Smoker  . Smokeless tobacco: Never Used  Substance Use Topics  . Alcohol use: No  . Drug use: Never   Allergies Codeine, Penicillins, Alprazolam, Ativan [lorazepam], Corticosteroids, Erythromycin, Erythromycin base, Milnacipran hcl, Prednisone, Savella  [milnacipran], Povidone iodine, and Prednisolone  Review of Systems Review of Systems All other systems are reviewed and are negative for acute change except as noted in the HPI  Physical Exam Vital Signs  I have reviewed the triage vital signs BP (!) 152/104 (BP Location: Left Arm)   Pulse 74   Temp 97.7 F (36.5 C) (Oral)   Resp 16   Ht 5\' 6"  (1.676 m)   Wt 117.9 kg   SpO2 98%   BMI 41.97 kg/m   Physical Exam Vitals reviewed.  Constitutional:      General: She is not in acute distress.    Appearance: She is well-developed. She is not diaphoretic.  HENT:     Head: Normocephalic and atraumatic.     Nose: Nose normal.  Eyes:     General: Scleral icterus present.        Right eye: No discharge.        Left eye: No discharge.     Conjunctiva/sclera: Conjunctivae normal.     Pupils: Pupils are equal, round, and reactive to light.  Cardiovascular:     Rate and Rhythm: Normal rate and regular rhythm.     Heart sounds: No murmur. No friction rub. No gallop.   Pulmonary:     Effort: Pulmonary effort is normal. No respiratory distress.     Breath sounds: Normal breath sounds. No stridor. No rales.  Abdominal:     General: There is no distension.     Palpations: Abdomen is soft.     Tenderness: There is no abdominal tenderness.  Musculoskeletal:        General: No tenderness.     Cervical back: Normal range of motion and neck supple.  Skin:     General: Skin is warm and dry.     Coloration: Skin is jaundiced.     Findings: No erythema or rash.  Neurological:     Mental Status: She is alert and oriented to person, place, and time.     Comments: -Frequently forgets what she is saying. -asterixis       ED Results and Treatments Labs (all labs ordered are listed, but only abnormal results are displayed) Labs Reviewed  COMPREHENSIVE METABOLIC PANEL - Abnormal; Notable for the following components:      Result Value   Glucose, Bld 137 (*)    BUN 24 (*)    Albumin 2.7 (*)    AST 146 (*)  ALT 74 (*)    Alkaline Phosphatase 218 (*)    Total Bilirubin 5.8 (*)    All other components within normal limits  CBC - Abnormal; Notable for the following components:   RBC 3.19 (*)    Hemoglobin 10.3 (*)    HCT 31.1 (*)    RDW 24.9 (*)    nRBC 3.8 (*)    All other components within normal limits  URINALYSIS, ROUTINE W REFLEX MICROSCOPIC - Abnormal; Notable for the following components:   Color, Urine AMBER (*)    Nitrite POSITIVE (*)    Bacteria, UA RARE (*)    All other components within normal limits  AMMONIA - Abnormal; Notable for the following components:   Ammonia 50 (*)    All other components within normal limits  BLOOD GAS, VENOUS - Abnormal; Notable for the following components:   Bicarbonate 30.0 (*)    Acid-Base Excess 4.5 (*)    All other components within normal limits  BILIRUBIN, DIRECT - Abnormal; Notable for the following components:   Bilirubin, Direct 3.0 (*)    All other components within normal limits  GLUCOSE, CAPILLARY - Abnormal; Notable for the following components:   Glucose-Capillary 143 (*)    All other components within normal limits  COMPREHENSIVE METABOLIC PANEL - Abnormal; Notable for the following components:   Glucose, Bld 202 (*)    BUN 24 (*)    Calcium 8.5 (*)    Albumin 2.4 (*)    AST 113 (*)    ALT 62 (*)    Alkaline Phosphatase 206 (*)    Total Bilirubin 4.5 (*)    Anion gap  2 (*)    All other components within normal limits  CBC - Abnormal; Notable for the following components:   RBC 2.95 (*)    Hemoglobin 9.5 (*)    HCT 28.9 (*)    RDW 25.1 (*)    nRBC 4.6 (*)    All other components within normal limits  AMMONIA - Abnormal; Notable for the following components:   Ammonia 118 (*)    All other components within normal limits  GLUCOSE, CAPILLARY - Abnormal; Notable for the following components:   Glucose-Capillary 161 (*)    All other components within normal limits  GLUCOSE, CAPILLARY - Abnormal; Notable for the following components:   Glucose-Capillary 155 (*)    All other components within normal limits  GLUCOSE, CAPILLARY - Abnormal; Notable for the following components:   Glucose-Capillary 266 (*)    All other components within normal limits  COMPREHENSIVE METABOLIC PANEL - Abnormal; Notable for the following components:   Glucose, Bld 151 (*)    Calcium 8.7 (*)    Albumin 2.5 (*)    AST 113 (*)    ALT 62 (*)    Alkaline Phosphatase 198 (*)    Total Bilirubin 5.2 (*)    All other components within normal limits  CBC WITH DIFFERENTIAL/PLATELET - Abnormal; Notable for the following components:   RBC 3.11 (*)    Hemoglobin 10.1 (*)    HCT 30.5 (*)    RDW 25.2 (*)    nRBC 4.7 (*)    All other components within normal limits  PROTIME-INR - Abnormal; Notable for the following components:   Prothrombin Time 15.8 (*)    INR 1.3 (*)    All other components within normal limits  GLUCOSE, CAPILLARY - Abnormal; Notable for the following components:   Glucose-Capillary 123 (*)    All  other components within normal limits  GLUCOSE, CAPILLARY - Abnormal; Notable for the following components:   Glucose-Capillary 132 (*)    All other components within normal limits  GLUCOSE, CAPILLARY - Abnormal; Notable for the following components:   Glucose-Capillary 184 (*)    All other components within normal limits  GLUCOSE, CAPILLARY - Abnormal; Notable for  the following components:   Glucose-Capillary 122 (*)    All other components within normal limits  AMMONIA - Abnormal; Notable for the following components:   Ammonia 67 (*)    All other components within normal limits  GLUCOSE, CAPILLARY - Abnormal; Notable for the following components:   Glucose-Capillary 183 (*)    All other components within normal limits  GLUCOSE, CAPILLARY - Abnormal; Notable for the following components:   Glucose-Capillary 160 (*)    All other components within normal limits  CBC WITH DIFFERENTIAL/PLATELET - Abnormal; Notable for the following components:   RBC 3.10 (*)    Hemoglobin 10.0 (*)    HCT 29.4 (*)    RDW 26.0 (*)    Platelets 421 (*)    nRBC 3.6 (*)    Lymphs Abs 4.2 (*)    All other components within normal limits  COMPREHENSIVE METABOLIC PANEL - Abnormal; Notable for the following components:   Glucose, Bld 157 (*)    Calcium 8.8 (*)    Albumin 2.4 (*)    AST 106 (*)    ALT 59 (*)    Alkaline Phosphatase 198 (*)    Total Bilirubin 4.8 (*)    All other components within normal limits  GLUCOSE, CAPILLARY - Abnormal; Notable for the following components:   Glucose-Capillary 182 (*)    All other components within normal limits  GLUCOSE, CAPILLARY - Abnormal; Notable for the following components:   Glucose-Capillary 133 (*)    All other components within normal limits  AMMONIA - Abnormal; Notable for the following components:   Ammonia 98 (*)    All other components within normal limits  GLUCOSE, CAPILLARY - Abnormal; Notable for the following components:   Glucose-Capillary 184 (*)    All other components within normal limits  GLUCOSE, CAPILLARY - Abnormal; Notable for the following components:   Glucose-Capillary 195 (*)    All other components within normal limits  CBC WITH DIFFERENTIAL/PLATELET - Abnormal; Notable for the following components:   RBC 3.47 (*)    Hemoglobin 11.1 (*)    HCT 33.1 (*)    RDW 26.7 (*)    Platelets 468  (*)    nRBC 5.6 (*)    All other components within normal limits  COMPREHENSIVE METABOLIC PANEL - Abnormal; Notable for the following components:   Glucose, Bld 172 (*)    Calcium 8.7 (*)    Albumin 2.7 (*)    AST 117 (*)    ALT 65 (*)    Alkaline Phosphatase 222 (*)    Total Bilirubin 4.2 (*)    All other components within normal limits  GLUCOSE, CAPILLARY - Abnormal; Notable for the following components:   Glucose-Capillary 163 (*)    All other components within normal limits  AMMONIA - Abnormal; Notable for the following components:   Ammonia 52 (*)    All other components within normal limits  GLUCOSE, CAPILLARY - Abnormal; Notable for the following components:   Glucose-Capillary 146 (*)    All other components within normal limits  GLUCOSE, CAPILLARY - Abnormal; Notable for the following components:   Glucose-Capillary 139 (*)  All other components within normal limits  CBC WITH DIFFERENTIAL/PLATELET - Abnormal; Notable for the following components:   RBC 3.20 (*)    Hemoglobin 10.4 (*)    HCT 31.1 (*)    RDW 27.3 (*)    Platelets 448 (*)    nRBC 5.9 (*)    All other components within normal limits  GLUCOSE, CAPILLARY - Abnormal; Notable for the following components:   Glucose-Capillary 191 (*)    All other components within normal limits  GLUCOSE, CAPILLARY - Abnormal; Notable for the following components:   Glucose-Capillary 141 (*)    All other components within normal limits  COMPREHENSIVE METABOLIC PANEL - Abnormal; Notable for the following components:   Glucose, Bld 157 (*)    Calcium 8.6 (*)    Albumin 2.4 (*)    AST 105 (*)    ALT 61 (*)    Alkaline Phosphatase 212 (*)    Total Bilirubin 3.0 (*)    All other components within normal limits  GLUCOSE, CAPILLARY - Abnormal; Notable for the following components:   Glucose-Capillary 132 (*)    All other components within normal limits  GLUCOSE, CAPILLARY - Abnormal; Notable for the following components:     Glucose-Capillary 184 (*)    All other components within normal limits  CBG MONITORING, ED - Abnormal; Notable for the following components:   Glucose-Capillary 115 (*)    All other components within normal limits  SARS CORONAVIRUS 2 (TAT 6-24 HRS)  LIPASE, BLOOD  HIV ANTIBODY (ROUTINE TESTING W REFLEX)  PROTIME-INR  PROTIME-INR  PROTIME-INR  I-STAT BETA HCG BLOOD, ED ([facility], [facility], [facility] ONLY)  SURGICAL PATHOLOGY                                                                                                                         EKG  EKG Interpretation  Date/Time:  Thursday November 21 2019 00:24:30 EST Ventricular Rate:  76 PR Interval:    QRS Duration: 85 QT Interval:  445 QTC Calculation: 501 R Axis:   29 Text Interpretation: Sinus rhythm Low voltage, precordial leads Borderline T abnormalities, anterior leads Borderline prolonged QT interval Confirmed by Addison Lank [phone]) on 11/21/2019 2:05:36 AM      Radiology No results found.  Pertinent labs & imaging results that were available during my care of the patient were reviewed by me and considered in my medical decision making (see chart for details).  Medications Ordered in ED Medications  lidocaine (XYLOCAINE) 1 % (with pres) injection (has no administration in time range)  midazolam (VERSED) 2 MG/2ML injection (has no administration in time range)  fentaNYL (SUBLIMAZE) 100 MCG/2ML injection (has no administration in time range)  sodium chloride flush (NS) 0.9 % injection 3 mL (3 mLs Intravenous Given 11/21/19 0414)  lactulose (CHRONULAC) 10 GM/15ML solution 30 g (30 g Oral Given 11/21/19 0645)  enoxaparin (LOVENOX) injection 40 mg (40 mg Subcutaneous Given 11/24/19 0924)  ibuprofen (ADVIL) tablet 600 mg (600 mg  Oral Given 11/22/19 2203)  traZODone (DESYREL) tablet 50 mg (50 mg Oral Given 11/23/19 2134)  gelatin adsorbable (GELFOAM/SURGIFOAM) 12-7 MM sponge 12-7 mm (1 each Injection Given 11/25/19 1442)  fentaNYL (SUBLIMAZE)  injection (50 mcg Intravenous Given 11/25/19 1434)  midazolam (VERSED) injection (1 mg Intravenous Given 11/25/19 1431)                                                                                                                                    Procedures Procedures  (including critical care time)  Medical Decision Making / ED Course I have reviewed the nursing notes for this encounter and the patient's prior records (if available in EHR or on provided paperwork).   Wendy Barber was evaluated in Emergency Department on 11/28/2019 for the symptoms described in the history of present illness. She was evaluated in the context of the global COVID-19 pandemic, which necessitated consideration that the patient might be at risk for infection with the SARS-CoV-2 virus that causes COVID-19. Institutional protocols and algorithms that pertain to the evaluation of patients at risk for COVID-19 are in a state of rapid change based on information released by regulatory bodies including the CDC and federal and state organizations. These policies and algorithms were followed during the patient's care in the ED.  Patient will mildly elevated LFTs and doubling of her bilirubin.  Patient has elevated ammonia level and physical findings concerning for encephalopathy, likely related to liver disease.  CT head obtained to rule out metastatic disease given her past cancer history which revealed only a known meningioma.  No electrolyte derangements contributing to altered mental status.  No infectious symptoms.  We will start patient on lactulose and admit for further management.      Final Clinical Impression(s) / ED Diagnoses Final diagnoses:  Hepatic encephalopathy ([place])      This chart was dictated using voice recognition software.  Despite best efforts to proofread,  errors can occur which can change the documentation meaning.   Fatima Blank, MD 11/28/19 [phone]

## 2019-11-21 NOTE — ED Notes (Addendum)
Attempted blood draw x2 No success Pt is a very difficult stick and will need IV and labs via U/S

## 2019-11-21 NOTE — Plan of Care (Signed)
  Problem: Bowel/Gastric: Goal: Will not experience complications related to bowel motility Outcome: Progressing   Problem: Coping: Goal: Ability to identify and develop effective coping behavior will improve Outcome: Progressing   Problem: Nutritional: Goal: Maintenance of adequate nutrition will improve Outcome: Progressing

## 2019-11-21 NOTE — ED Triage Notes (Signed)
Patient states that she was dx with autoimmune hepatitis C. Patient states her md told her that her levels were high and she needs to come to the ED. Patient also been weak having trouble walking. Patient has jerking motions that she can not control. Patient complaining of abdominal pain, nausea, and vomiting.

## 2019-11-21 NOTE — ED Notes (Signed)
Patient ambulated to RR and back with assistance, urine sample provided.

## 2019-11-21 NOTE — ED Notes (Signed)
Husband Wendy Barber [phone] cell phone will like the doctor to contact him with results.

## 2019-11-21 NOTE — ED Notes (Signed)
Report given to Autumn, RN at [facility].

## 2019-11-22 ENCOUNTER — Encounter (HOSPITAL_COMMUNITY): Payer: Self-pay | Admitting: Family Medicine

## 2019-11-22 DIAGNOSIS — Z880 Allergy status to penicillin: Secondary | ICD-10-CM | POA: Diagnosis not present

## 2019-11-22 DIAGNOSIS — K7210 Chronic hepatic failure without coma: Secondary | ICD-10-CM | POA: Diagnosis present

## 2019-11-22 DIAGNOSIS — Z9049 Acquired absence of other specified parts of digestive tract: Secondary | ICD-10-CM | POA: Diagnosis not present

## 2019-11-22 DIAGNOSIS — Z791 Long term (current) use of non-steroidal anti-inflammatories (NSAID): Secondary | ICD-10-CM | POA: Diagnosis not present

## 2019-11-22 DIAGNOSIS — Z86711 Personal history of pulmonary embolism: Secondary | ICD-10-CM | POA: Diagnosis not present

## 2019-11-22 DIAGNOSIS — M797 Fibromyalgia: Secondary | ICD-10-CM | POA: Diagnosis present

## 2019-11-22 DIAGNOSIS — Z9081 Acquired absence of spleen: Secondary | ICD-10-CM | POA: Diagnosis not present

## 2019-11-22 DIAGNOSIS — G250 Essential tremor: Secondary | ICD-10-CM | POA: Diagnosis present

## 2019-11-22 DIAGNOSIS — Z882 Allergy status to sulfonamides status: Secondary | ICD-10-CM | POA: Diagnosis not present

## 2019-11-22 DIAGNOSIS — Z20822 Contact with and (suspected) exposure to covid-19: Secondary | ICD-10-CM | POA: Diagnosis present

## 2019-11-22 DIAGNOSIS — K7290 Hepatic failure, unspecified without coma: Secondary | ICD-10-CM | POA: Diagnosis present

## 2019-11-22 DIAGNOSIS — D509 Iron deficiency anemia, unspecified: Secondary | ICD-10-CM | POA: Diagnosis present

## 2019-11-22 DIAGNOSIS — Z8509 Personal history of malignant neoplasm of other digestive organs: Secondary | ICD-10-CM | POA: Diagnosis not present

## 2019-11-22 DIAGNOSIS — K5900 Constipation, unspecified: Secondary | ICD-10-CM | POA: Diagnosis present

## 2019-11-22 DIAGNOSIS — Z885 Allergy status to narcotic agent status: Secondary | ICD-10-CM | POA: Diagnosis not present

## 2019-11-22 DIAGNOSIS — Z79899 Other long term (current) drug therapy: Secondary | ICD-10-CM | POA: Diagnosis not present

## 2019-11-22 DIAGNOSIS — K754 Autoimmune hepatitis: Secondary | ICD-10-CM | POA: Diagnosis present

## 2019-11-22 DIAGNOSIS — Z6841 Body Mass Index (BMI) 40.0 and over, adult: Secondary | ICD-10-CM | POA: Diagnosis not present

## 2019-11-22 DIAGNOSIS — R410 Disorientation, unspecified: Secondary | ICD-10-CM | POA: Diagnosis present

## 2019-11-22 DIAGNOSIS — K7682 Hepatic encephalopathy: Secondary | ICD-10-CM | POA: Diagnosis present

## 2019-11-22 DIAGNOSIS — E119 Type 2 diabetes mellitus without complications: Secondary | ICD-10-CM | POA: Diagnosis present

## 2019-11-22 DIAGNOSIS — Z888 Allergy status to other drugs, medicaments and biological substances status: Secondary | ICD-10-CM | POA: Diagnosis not present

## 2019-11-22 DIAGNOSIS — Z9221 Personal history of antineoplastic chemotherapy: Secondary | ICD-10-CM | POA: Diagnosis not present

## 2019-11-22 DIAGNOSIS — Z794 Long term (current) use of insulin: Secondary | ICD-10-CM | POA: Diagnosis not present

## 2019-11-22 DIAGNOSIS — I10 Essential (primary) hypertension: Secondary | ICD-10-CM | POA: Diagnosis present

## 2019-11-22 DIAGNOSIS — F419 Anxiety disorder, unspecified: Secondary | ICD-10-CM | POA: Diagnosis present

## 2019-11-22 LAB — COMPREHENSIVE METABOLIC PANEL
ALT: 62 U/L — ABNORMAL HIGH (ref 0–44)
AST: 113 U/L — ABNORMAL HIGH (ref 15–41)
Albumin: 2.4 g/dL — ABNORMAL LOW (ref 3.5–5.0)
Alkaline Phosphatase: 206 U/L — ABNORMAL HIGH (ref 38–126)
Anion gap: 2 — ABNORMAL LOW (ref 5–15)
BUN: 24 mg/dL — ABNORMAL HIGH (ref 6–20)
CO2: 29 mmol/L (ref 22–32)
Calcium: 8.5 mg/dL — ABNORMAL LOW (ref 8.9–10.3)
Chloride: 104 mmol/L (ref 98–111)
Creatinine, Ser: 0.85 mg/dL (ref 0.44–1.00)
GFR calc Af Amer: >60 mL/min (ref >60)
GFR calc non Af Amer: >60 mL/min (ref >60)
Glucose, Bld: 202 mg/dL — ABNORMAL HIGH (ref 70–99)
Potassium: 4.4 mmol/L (ref 3.5–5.1)
Sodium: 135 mmol/L (ref 135–145)
Total Bilirubin: 4.5 mg/dL — ABNORMAL HIGH (ref 0.3–1.2)
Total Protein: 7 g/dL (ref 6.5–8.1)

## 2019-11-22 LAB — GLUCOSE, CAPILLARY
Glucose-Capillary: 132 mg/dL — ABNORMAL HIGH (ref 70–99)
Glucose-Capillary: 155 mg/dL — ABNORMAL HIGH (ref 70–99)
Glucose-Capillary: 266 mg/dL — ABNORMAL HIGH (ref 70–99)
Glucose-Capillary: 123 mg/dL — ABNORMAL HIGH (ref 70–99)
Glucose-Capillary: 184 mg/dL — ABNORMAL HIGH (ref 70–99)

## 2019-11-22 LAB — CBC
HCT: 28.9 % — ABNORMAL LOW (ref 36.0–46.0)
Hemoglobin: 9.5 g/dL — ABNORMAL LOW (ref 12.0–15.0)
MCH: 32.2 pg (ref 26.0–34.0)
MCHC: 32.9 g/dL (ref 30.0–36.0)
MCV: 98 fL (ref 80.0–100.0)
Platelets: 376 10*3/uL (ref 150–400)
RBC: 2.95 MIL/uL — ABNORMAL LOW (ref 3.87–5.11)
RDW: 25.1 % — ABNORMAL HIGH (ref 11.5–15.5)
WBC: 5.9 10*3/uL (ref 4.0–10.5)
nRBC: 4.6 % — ABNORMAL HIGH (ref 0.0–0.2)

## 2019-11-22 LAB — AMMONIA: Ammonia: 118 umol/L — ABNORMAL HIGH (ref 9–35)

## 2019-11-22 MED ORDER — BUSPIRONE HCL 5 MG PO TABS
5.0000 mg | ORAL_TABLET | Freq: Three times a day (TID) | ORAL | Status: DC | PRN
  Administered 2019-11-22 – 2019-11-24 (×5): 5 mg via ORAL
  Filled 2019-11-22 (×5): qty 1

## 2019-11-22 MED ORDER — IBUPROFEN 200 MG PO TABS
600.0000 mg | ORAL_TABLET | Freq: Once | ORAL | Status: AC
  Administered 2019-11-22: 600 mg via ORAL
  Filled 2019-11-22: qty 3

## 2019-11-22 NOTE — Progress Notes (Signed)
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 Alan Ripper [facility], taking azathioprine Admitted with 2 weeks of confusion forgetfulness and lethargy On admission ammonia found to be 50 CT head showed a meningioma with no acute intracranial abnormalities LFTs altered  Given her confusion I spoke with husband [phone] changes to meds recently--started Azathiopurine ~ 3 weeks ago to Rx the AI hepatitis--had an allergic reaction to the prednisone [started by Dr. Verlin Dike allegedly has always had memory issues--cannot get sentences and ideas straight---confused and disoriented   Data Reviewed:  Last labs from [facility] showed ALT AST 24/2.2 bilirubin 0.5 alk phos 112  BUN/creatinine 24/0.8 Ammonia 118 bilirubin 4.5 alk phos 206 AST ALT 113/62 Hemoglobin 9.5 platelet 376 White count 5.9  Assessment & Plan:  Autoimmune hepatitis vs acute effect of Azathiopurine lft up--Await  trends of LFT's over the next day day and a half as it seems downtrending-may need to either discontinue azathioprine and use alternative agents such as mercaptopurine and/or rechallenge with steroids and will need GI input with regards to the same Probable hepatic encephalopathy Continue management of the same with lactulose and aim for 2-3 relatively loose stools daily Increase dose if this is not occurring  Disseminated adeno mucinosis appendix stage IV status post surgery 2005 Prior history of the  same DM TY 2 CBGs trending 1 55-202-held Lantus 22 and 3 times daily 12 to 14 units of insulin at this time would continue only sliding scale holding also at this time Metformin 1000 twice daily and Trulicity 6.38 weekly IDA Monitor trends- HTN Holding at this time combination blood pressure medication-at this time continue Inderal 20 twice daily Bipolar Resume in the next several days BuSpar 5 3 times daily Would hold for now Lyrica   Subjective: Patient is awake alert eating breakfast but cannot recall some of the names of her conditions has trouble remembering what we just discussed and overall does not seem very clear Can tell me that she is in the hospital contaminates [place] can tell me the year Seems frustrated Consultants:   None yet  Objective: Vitals:   11/21/19 0931 11/21/19 1452 11/21/19 2151 11/22/19 0631  BP: 134/90 135/84 130/86 (!) 130/92  Pulse: 69 68 73 69  Resp: 16 16 17 17   Temp: 97.9 F (36.6 C) 97.9 F (36.6 C) 97.8 F (36.6 C) 97.7 F (36.5 C)  TempSrc: Oral Oral Oral Oral  SpO2: 97% 100% 98% 98%  Weight: 121.2 kg     Height: 5' 6"  (1.676 m)       Intake/Output Summary (Last 24 hours) at 11/22/2019 0747 Last data filed at 11/22/2019 0700 Gross per 24 hour  Intake 240 ml  Output 1050 ml  Net -810 ml   Filed Weights   11/21/19 0018 11/21/19 0931  Weight: 117.9 kg 121.2 kg    Examination: Obese white female no distress EOMI NCAT mildly icteric CTA B Abdomen soft no rebound cannot appreciate hepatosplenomegaly Mild lower extremity edema left side S1-S2 no murmur rub or gallop Cannot really appreciate asterixis  Scheduled Meds: . azaTHIOprine  150 mg Oral Daily  . enoxaparin (LOVENOX) injection  40 mg Subcutaneous Q24H  . ferrous sulfate  325 mg Oral Q M,W,F  . insulin aspart  0-5 Units Subcutaneous QHS  . insulin aspart  0-9 Units Subcutaneous TID WC  . lactulose  20 g Oral TID  . pantoprazole  40 mg Oral Daily  . propranolol  20  mg Oral BID   Continuous Infusions:   LOS: 1 day   Time spent: Willard, MD [facility]  11/22/2019, 7:47 AM

## 2019-11-22 NOTE — Progress Notes (Signed)
Medical records has been contacted at [facility]. They are going to fax over records for patient.

## 2019-11-22 NOTE — Consult Note (Signed)
Reason for Consult: Autoimmune hepatitis with probable mild encephalopathy Referring Physician: Hospital team  Wendy Barber is an 59 y.o. female.  HPI: Patient seen and examined in her hospital computer chart reviewed including care everywhere and her case was discussed with her primary gastroenterologist at [facility] clinic and she supposedly going to fax [place] some lab work and her diagnosis was made based on serologies and she was on steroids but got changed to azathioprine a month ago and other than starting biotin she does not believe she is on any other new medicines and she denies alcohol but both her sisters had hepatitis C and she has been moving her bowels and her main complaint is increased tremors and some fine motor skill difficulties but no other specific complaint and she is eating okay  Past Medical History:  Diagnosis Date  . Arthritis   . Back pain   . Cancer [facility])    pseudomyxoma peritonei  . Chronic fatigue syndrome   . Diabetes mellitus without complication ([place])   . Fibromyalgia   . Hypertension     Past Surgical History:  Procedure Laterality Date  . ABDOMINAL HYSTERECTOMY    . ABDOMINAL SURGERY    . ACHILLES TENDON REPAIR    . APPENDECTOMY    . ARTHROPLASTY    . CARPAL TUNNEL RELEASE    . CHOLECYSTECTOMY    . JOINT REPLACEMENT    . KNEE ARTHROSCOPY    . ORIF ANKLE FRACTURE Right 08/27/2019   Procedure: OPEN REDUCTION INTERNAL FIXATION RIGHT ANKLE FRACTURE;  Surgeon: Renette Butters, MD;  Location: [facility];  Service: Orthopedics;  Laterality: Right;  . perineorrophy    . TONGUE BIOPSY      History reviewed. No pertinent family history.  Social History:  reports that she has never smoked. She has never used smokeless tobacco. She reports that she does not drink alcohol or use drugs.  Allergies:  Allergies  Allergen Reactions  . Codeine Shortness Of Breath and Palpitations  . Penicillins Shortness Of Breath    Other reaction(s): Irregular Heart Rate,  Other (See Comments) Rapid heartrate   . Alprazolam Other (See Comments) and Hives    unresponsive Hard to arouse unresponsive Hard to arouse  Other reaction(s): Other (See Comments) unresponsive  . Ativan [Lorazepam] Other (See Comments)    Face & Throat Swelling.  . Corticosteroids Other (See Comments)    Other reaction(s): Other (see comments) Psychotic behaivor Other reaction(s): Other (See Comments) Psychotic behaivor Psychotic behaivor Psychotic behaivor   . Erythromycin     Other reaction(s): Other (See Comments) Severe stomach pain   . Erythromycin Base Hives  . Milnacipran Hcl Other (See Comments)    Other reaction(s): Other (see comments) psychotic episode psychotic episode  Other reaction(s): Other (See Comments) psychotic episode  . Prednisone Other (See Comments)    Anxiety & Nervous Breakdown.  Ocie Cornfield  [Milnacipran]   . Povidone Iodine Rash    Had rash under bandage after surgery , has had betadine since without reaction   . Prednisolone Anxiety    Medications: I have reviewed the patient's current medications.  Results for orders placed or performed during the hospital encounter of 11/21/19 (from the past 48 hour(s))  Lipase, blood     Status: None   Collection Time: 11/21/19 12:15 AM  Result Value Ref Range   Lipase 38 11 - 51 U/L    Comment: Performed at [facility], [place] [address], [place], [place] [address]  Comprehensive  metabolic panel     Status: Abnormal   Collection Time: 11/21/19 12:15 AM  Result Value Ref Range   Sodium 137 135 - 145 mmol/L   Potassium 4.5 3.5 - 5.1 mmol/L   Chloride 101 98 - 111 mmol/L   CO2 27 22 - 32 mmol/L   Glucose, Bld 137 (H) 70 - 99 mg/dL   BUN 24 (H) 6 - 20 mg/dL   Creatinine, Ser 0.71 0.44 - 1.00 mg/dL   Calcium 9.0 8.9 - 10.3 mg/dL   Total Protein 7.8 6.5 - 8.1 g/dL   Albumin 2.7 (L) 3.5 - 5.0 g/dL   AST 146 (H) 15 - 41 U/L   ALT 74 (H) 0 - 44 U/L   Alkaline Phosphatase 218 (H)  38 - 126 U/L   Total Bilirubin 5.8 (H) 0.3 - 1.2 mg/dL   GFR calc non Af Amer >60 >60 mL/min   GFR calc Af Amer >60 >60 mL/min   Anion gap 9 5 - 15    Comment: Performed at [facility], [place] [address] [place], [place] [address]  CBC     Status: Abnormal   Collection Time: 11/21/19 12:15 AM  Result Value Ref Range   WBC 6.5 4.0 - 10.5 K/uL   RBC 3.19 (L) 3.87 - 5.11 MIL/uL   Hemoglobin 10.3 (L) 12.0 - 15.0 g/dL   HCT 31.1 (L) 36.0 - 46.0 %   MCV 97.5 80.0 - 100.0 fL   MCH 32.3 26.0 - 34.0 pg   MCHC 33.1 30.0 - 36.0 g/dL   RDW 24.9 (H) 11.5 - 15.5 %   Platelets 384 150 - 400 K/uL   nRBC 3.8 (H) 0.0 - 0.2 %    Comment: Performed at [facility], [place] [address] [place], [place] [address]  Blood gas, venous (at [facility] and [facility], not at [facility]     Status: Abnormal   Collection Time: 11/21/19  2:23 AM  Result Value Ref Range   pH, Ven 7.383 7.250 - 7.430   pCO2, Ven 51.7 44.0 - 60.0 mmHg   pO2, Ven 34.2 32.0 - 45.0 mmHg   Bicarbonate 30.0 (H) 20.0 - 28.0 mmol/L   Acid-Base Excess 4.5 (H) 0.0 - 2.0 mmol/L   O2 Saturation 60.3 %   Patient temperature 98.6     Comment: Performed at [facility], [place] [address], [place], [place] [address]  Ammonia     Status: Abnormal   Collection Time: 11/21/19  2:31 AM  Result Value Ref Range   Ammonia 50 (H) 9 - 35 umol/L    Comment: Performed at [facility], [place] [address] [place], [place] [address]  HIV Antibody (routine testing w rflx)     Status: None   Collection Time: 11/21/19  2:32 AM  Result Value Ref Range   HIV Screen 4th Generation wRfx NON REACTIVE NON REACTIVE    Comment: Performed at [place] [address] [place], [place] [address]  POC CBG, ED     Status: Abnormal   Collection Time: 11/21/19  3:09 AM  Result Value Ref Range   Glucose-Capillary 115 (H) 70 - 99 mg/dL  SARS CORONAVIRUS 2 (TAT 6-24 HRS) Nasopharyngeal Nasopharyngeal Swab     Status:  None   Collection Time: 11/21/19  3:11 AM   Specimen: Nasopharyngeal Swab  Result Value Ref Range   SARS Coronavirus 2 NEGATIVE NEGATIVE    Comment: (NOTE) SARS-CoV-2 target nucleic acids are  NOT DETECTED. The SARS-CoV-2 RNA is generally detectable in upper and lower respiratory specimens during the acute phase of infection. Negative results do not preclude SARS-CoV-2 infection, do not rule out co-infections with other pathogens, and should not be used as the sole basis for treatment or other patient management decisions. Negative results must be combined with clinical observations, patient history, and epidemiological information. The expected result is Negative. Fact Sheet for Patients: SugarRoll.be Fact Sheet for Healthcare Providers: https://www.woods-mathews.com/ This test is not yet approved or cleared by the [place] FDA and  has been authorized for detection and/or diagnosis of SARS-CoV-2 by FDA under an Emergency Use Authorization (EUA). This EUA will remain  in effect (meaning this test can be used) for the duration of the COVID-19 declaration under Section 56 4(b)(1) of the Act, 21 U.S.C. section 360bbb-3(b)(1), unless the authorization is terminated or revoked sooner. Performed at [facility], [place] [address] [place], [place] [address]   I-Stat beta hCG blood, ED     Status: None   Collection Time: 11/21/19  3:20 AM  Result Value Ref Range   I-stat hCG, quantitative <5.0 <5 mIU/mL   Comment 3            Comment:   GEST. AGE      CONC.  (mIU/mL)   <=1 WEEK        5 - 50     2 WEEKS       50 - 500     3 WEEKS       100 - 10,000     4 WEEKS     1,000 - 30,000        FEMALE AND NON-PREGNANT FEMALE:     LESS THAN 5 mIU/mL   Bilirubin, direct     Status: Abnormal   Collection Time: 11/21/19  3:26 AM  Result Value Ref Range   Bilirubin, Direct 3.0 (H) 0.0 - 0.2 mg/dL    Comment: Performed at [facility], [place] [address] [place], [place] [address]  Urinalysis, Routine w reflex microscopic     Status: Abnormal   Collection Time: 11/21/19  8:32 AM  Result Value Ref Range   Color, Urine AMBER (A) YELLOW    Comment: BIOCHEMICALS MAY BE AFFECTED BY COLOR   APPearance CLEAR CLEAR   Specific Gravity, Urine 1.023 1.005 - 1.030   pH 6.0 5.0 - 8.0   Glucose, UA NEGATIVE NEGATIVE mg/dL   Hgb urine dipstick NEGATIVE NEGATIVE   Bilirubin Urine NEGATIVE NEGATIVE   Ketones, ur NEGATIVE NEGATIVE mg/dL   Protein, ur NEGATIVE NEGATIVE mg/dL   Nitrite POSITIVE (A) NEGATIVE   Leukocytes,Ua NEGATIVE NEGATIVE   RBC / HPF 0-5 0 - 5 RBC/hpf   WBC, UA 6-10 0 - 5 WBC/hpf   Bacteria, UA RARE (A) NONE SEEN   Squamous Epithelial / LPF 6-10 0 - 5   Mucus PRESENT     Comment: Performed at [facility], [place] [address], [place],  [address]  Glucose, capillary     Status: Abnormal   Collection Time: 11/21/19 11:57 AM  Result Value Ref Range   Glucose-Capillary 143 (H) 70 - 99 mg/dL   Comment 1 Notify RN    Comment 2 Document in Chart   Glucose, capillary     Status: Abnormal   Collection Time: 11/21/19  9:53 PM  Result Value Ref Range   Glucose-Capillary 161 (H) 70 - 99 mg/dL   Comment 1  Notify RN    Comment 2 Document in Chart   Comprehensive metabolic panel     Status: Abnormal   Collection Time: 11/22/19  4:24 AM  Result Value Ref Range   Sodium 135 135 - 145 mmol/L   Potassium 4.4 3.5 - 5.1 mmol/L   Chloride 104 98 - 111 mmol/L   CO2 29 22 - 32 mmol/L   Glucose, Bld 202 (H) 70 - 99 mg/dL   BUN 24 (H) 6 - 20 mg/dL   Creatinine, Ser 0.85 0.44 - 1.00 mg/dL   Calcium 8.5 (L) 8.9 - 10.3 mg/dL   Total Protein 7.0 6.5 - 8.1 g/dL   Albumin 2.4 (L) 3.5 - 5.0 g/dL   AST 113 (H) 15 - 41 U/L   ALT 62 (H) 0 - 44 U/L   Alkaline Phosphatase 206 (H) 38 - 126 U/L   Total Bilirubin 4.5 (H) 0.3 - 1.2 mg/dL   GFR calc non Af Amer >60 >60 mL/min   GFR calc Af Amer >60 >60 mL/min    Anion gap 2 (L) 5 - 15    Comment: Performed at [facility], [place] [address], [place], [place] [address]  CBC     Status: Abnormal   Collection Time: 11/22/19  4:24 AM  Result Value Ref Range   WBC 5.9 4.0 - 10.5 K/uL   RBC 2.95 (L) 3.87 - 5.11 MIL/uL   Hemoglobin 9.5 (L) 12.0 - 15.0 g/dL   HCT 28.9 (L) 36.0 - 46.0 %   MCV 98.0 80.0 - 100.0 fL   MCH 32.2 26.0 - 34.0 pg   MCHC 32.9 30.0 - 36.0 g/dL   RDW 25.1 (H) 11.5 - 15.5 %   Platelets 376 150 - 400 K/uL   nRBC 4.6 (H) 0.0 - 0.2 %    Comment: Performed at [facility], [place] [address], [place], [place] [address]  Ammonia     Status: Abnormal   Collection Time: 11/22/19  4:24 AM  Result Value Ref Range   Ammonia 118 (H) 9 - 35 umol/L    Comment: Performed at [facility], [place] [address], [place], [place] [address]  Glucose, capillary     Status: Abnormal   Collection Time: 11/22/19  7:47 AM  Result Value Ref Range   Glucose-Capillary 155 (H) 70 - 99 mg/dL   Comment 1 Notify RN    Comment 2 Document in Chart   Glucose, capillary     Status: Abnormal   Collection Time: 11/22/19 12:08 PM  Result Value Ref Range   Glucose-Capillary 266 (H) 70 - 99 mg/dL   Comment 1 Notify RN    Comment 2 Document in Chart     CT Head Wo Contrast  Result Date: 11/21/2019 CLINICAL DATA:  Weakness and uncontrolled jerking. EXAM: CT HEAD WITHOUT CONTRAST TECHNIQUE: Contiguous axial images were obtained from the base of the skull through the vertex without intravenous contrast. COMPARISON:  08/22/2019 FINDINGS: Brain: No evidence of acute infarction, hemorrhage, hydrocephalus, extra-axial collection. There is a dural-based calcification projecting inferiorly from the midline tentorium and measuring 13 mm, stable. Motion artifact towards the vertex and at the skull base. Vascular: No hyperdense vessel or unexpected calcification. Skull: Normal. Negative for fracture or focal lesion.  Sinuses/Orbits: Negative IMPRESSION: 1. Motion degraded head CT without acute finding. 2. 13 mm dural based calcification along the tentorium, suspect meningioma. Electronically Signed   By: Monte Fantasia M.D.   On: 11/21/2019 05:18  Review of Systems negative except above Blood pressure (!) 152/88, pulse 70, temperature 98.2 F (36.8 C), temperature source Oral, resp. rate 17, height 5\' 6"  (1.676 m), weight 121.2 kg, SpO2 95 %. Physical Exam vital signs stable afebrile no acute distress she does answer all questions appropriately although slightly slow and memory a little slow but I believe her answers are correct exam pertinent for abdomen being minimal tenderness soft no guarding or rebound labs reviewed no flap or tremors  Assessment/Plan: Multiple medical problems including probable autoimmune liver disease Plan: Consider adding Xifaxan and will hold Imuran for now and consider a liver biopsy to prove the diagnosis versus a hepatology consult and she might need a CT scan to reevaluate her abdomen and await repeat ammonia and INR and Dr. Paulita Fujita to check on tomorrow  [facility] 11/22/2019, 4:23 PM

## 2019-11-23 LAB — GLUCOSE, CAPILLARY
Glucose-Capillary: 122 mg/dL — ABNORMAL HIGH (ref 70–99)
Glucose-Capillary: 183 mg/dL — ABNORMAL HIGH (ref 70–99)
Glucose-Capillary: 160 mg/dL — ABNORMAL HIGH (ref 70–99)
Glucose-Capillary: 182 mg/dL — ABNORMAL HIGH (ref 70–99)

## 2019-11-23 LAB — COMPREHENSIVE METABOLIC PANEL
ALT: 62 U/L — ABNORMAL HIGH (ref 0–44)
AST: 113 U/L — ABNORMAL HIGH (ref 15–41)
Albumin: 2.5 g/dL — ABNORMAL LOW (ref 3.5–5.0)
Alkaline Phosphatase: 198 U/L — ABNORMAL HIGH (ref 38–126)
Anion gap: 8 (ref 5–15)
BUN: 20 mg/dL (ref 6–20)
CO2: 25 mmol/L (ref 22–32)
Calcium: 8.7 mg/dL — ABNORMAL LOW (ref 8.9–10.3)
Chloride: 105 mmol/L (ref 98–111)
Creatinine, Ser: 0.59 mg/dL (ref 0.44–1.00)
GFR calc Af Amer: >60 mL/min (ref >60)
GFR calc non Af Amer: >60 mL/min (ref >60)
Glucose, Bld: 151 mg/dL — ABNORMAL HIGH (ref 70–99)
Potassium: 4.1 mmol/L (ref 3.5–5.1)
Sodium: 138 mmol/L (ref 135–145)
Total Bilirubin: 5.2 mg/dL — ABNORMAL HIGH (ref 0.3–1.2)
Total Protein: 7.4 g/dL (ref 6.5–8.1)

## 2019-11-23 LAB — CBC WITH DIFFERENTIAL/PLATELET
Abs Immature Granulocytes: 0.03 10*3/uL (ref 0.00–0.07)
Basophils Absolute: 0.1 10*3/uL (ref 0.0–0.1)
Basophils Relative: 1 %
Eosinophils Absolute: 0.3 10*3/uL (ref 0.0–0.5)
Eosinophils Relative: 4 %
HCT: 30.5 % — ABNORMAL LOW (ref 36.0–46.0)
Hemoglobin: 10.1 g/dL — ABNORMAL LOW (ref 12.0–15.0)
Immature Granulocytes: 1 %
Lymphocytes Relative: 52 %
Lymphs Abs: 3.5 10*3/uL (ref 0.7–4.0)
MCH: 32.5 pg (ref 26.0–34.0)
MCHC: 33.1 g/dL (ref 30.0–36.0)
MCV: 98.1 fL (ref 80.0–100.0)
Monocytes Absolute: 0.4 10*3/uL (ref 0.1–1.0)
Monocytes Relative: 6 %
Neutro Abs: 2.4 10*3/uL (ref 1.7–7.7)
Neutrophils Relative %: 36 %
Platelets: 379 10*3/uL (ref 150–400)
RBC: 3.11 MIL/uL — ABNORMAL LOW (ref 3.87–5.11)
RDW: 25.2 % — ABNORMAL HIGH (ref 11.5–15.5)
WBC: 6.6 10*3/uL (ref 4.0–10.5)
nRBC: 4.7 % — ABNORMAL HIGH (ref 0.0–0.2)

## 2019-11-23 LAB — PROTIME-INR
INR: 1.3 — ABNORMAL HIGH (ref 0.8–1.2)
Prothrombin Time: 15.8 seconds — ABNORMAL HIGH (ref 11.4–15.2)

## 2019-11-23 LAB — AMMONIA: Ammonia: 67 umol/L — ABNORMAL HIGH (ref 9–35)

## 2019-11-23 MED ORDER — TRAZODONE HCL 50 MG PO TABS
50.0000 mg | ORAL_TABLET | Freq: Once | ORAL | Status: AC
  Administered 2019-11-23: 50 mg via ORAL
  Filled 2019-11-23: qty 1

## 2019-11-23 NOTE — Progress Notes (Signed)
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 Alan Ripper [facility], taking azathioprine Admitted with 2 weeks of confusion forgetfulness and lethargy On admission ammonia found to be 50 CT head showed a meningioma with no acute intracranial abnormalities LFTs altered  Dr. Twana First Shahid notes received-abnormal LFT-Labs drawn 07/12/2019 alk phos 213 AST 460 ALT 322- -work-up + ASMA, - ANA, elevated IgG?  Allergic prednisone-cause psychosis-stage F4 fibrosure = cirrhosis, S3 steatosis-started Imuran 150 mg daily around 10/05/2019 Xifaxan in addition for IBS-referred to rheumatology because of questionable rheumatoid arthritis  10/31/2018 labs AST 213 ALT 136 alk phos 240 1T bili 2.3 Follow-up exam 1/14 showed  She was also felt hypertension mild senile dementia as a possible diagnosis 183-->241-->227 AST 411-->213->152 ALT 304-->136-->101 It was felt that she was improving on Imuran and labs should be repeated in 8 weeks she was referred on that admission for depression. Ultimately on 1/25 she came with her daughter patient was having issues with sleepiness confusion she was in wheelchair-lab slip was that showed alk phos 227 AST 42 ALT 101 she was started on diclofenac sodium and she was recommended to hold off on opiates because of confusion-it looks like she was seen for possible  depression by psychiatry on 1/26 she was told to continue these   Data Reviewed:  Last labs from [facility] showed ALT AST 24/2.2 bilirubin 0.5 alk phos 112  BUN/creatinine 24/0.8 Ammonia 118 bilirubin 4.5 alk phos 206 AST ALT 113/62 Hemoglobin 9.5 platelet 376 White count 5.9  Assessment & Plan:  Autoimmune hepatitis vs acute effect of Azathiopurine lft up--Await further input from GI-ammonia level 67 LFTs are significantly better-we will obtain liver biopsy as it may be NAFLD superimposition on Probable hepatic encephalopathy Continue management of the same with lactulose and aim for 2-3 relatively loose stools daily Her mentation is better and she was able to do more today than she has been as below History of DVT and PE with IVC filter No anticoagulation at this time other than prophylactic  Disseminated adeno mucinosis appendix stage IV status post surgery 2005 Prior history of the same DM TY 2 CBG 122-184 -Home meds Lantus 22 and 3 times daily 12 to 14 units Would continue only sliding scale holding also at this time Metformin 1000 twice daily and Trulicity 2.97 weekly IDA Monitor trends- HTN Holding at this time combination blood pressure medication-at this time continue Inderal 20 twice daily Bipolar Resume in the next several days BuSpar 5 3 times daily Would hold for now Lyrica   Subjective:  Mentation improved-patient sitting in chair-was able to dial her husband which I do not think she would have been able to do yesterday She seems more coherent although seems quite depressed  Consultants:   None yet  Objective: Vitals:   11/22/19 0631 11/22/19 1358 11/22/19 2046 11/23/19 0606  BP: (!) 130/92 (!) 152/88 126/66 (!) 156/81  Pulse: 69 70 70 65  Resp: 17 17 18  18  Temp: 97.7 F (36.5 C) 98.2 F (36.8 C) 97.8 F (36.6 C) 97.8 F (36.6 C)  TempSrc: Oral Oral Oral Oral  SpO2: 98% 95% 98% 98%  Weight:      Height:        Intake/Output  Summary (Last 24 hours) at 11/23/2019 0844 Last data filed at 11/23/2019 4818 Gross per 24 hour  Intake 560 ml  Output 2200 ml  Net -1640 ml   Filed Weights   11/21/19 0018 11/21/19 0931  Weight: 117.9 kg 121.2 kg    Examination: Coherent no distress EOMI NCAT no flap S1-S2 no murmur Thick neck Mallampati 3 Abdomen soft cannot appreciate hepatosplenomegaly No lower extremity edema Power 5/5  Scheduled Meds: . enoxaparin (LOVENOX) injection  40 mg Subcutaneous Q24H  . ferrous sulfate  325 mg Oral Q M,W,F  . insulin aspart  0-5 Units Subcutaneous QHS  . insulin aspart  0-9 Units Subcutaneous TID WC  . lactulose  20 g Oral TID  . pantoprazole  40 mg Oral Daily  . propranolol  20 mg Oral BID   Continuous Infusions:   LOS: 2 days   Time spent: [place], MD [facility]  11/23/2019, 8:44 AM

## 2019-11-23 NOTE — Progress Notes (Signed)
Subjective: Mental status improving per family, but not at baseline. Has upper abdominal discomfort (no change).  Objective: Vital signs in last 24 hours: Temp:  [97.8 F (36.6 C)-97.9 F (36.6 C)] 97.9 F (36.6 C) (01/30 1323) Pulse Rate:  [65-70] 66 (01/30 1323) Resp:  [16-18] 16 (01/30 1323) BP: (126-156)/(66-81) 129/78 (01/30 1323) SpO2:  [98 %] 98 % (01/30 1323) Weight change:  Last BM Date: 11/21/19  PE: GEN: Jaundiced, overweight ABD:  Soft, protuberant, mild upper abdominal tenderness without peritonitis NEURO:  Alert, but some expressive aphasia  Lab Results: CBC    Component Value Date/Time   WBC 6.6 11/23/2019 0412   RBC 3.11 (L) 11/23/2019 0412   HGB 10.1 (L) 11/23/2019 0412   HCT 30.5 (L) 11/23/2019 0412   PLT 379 11/23/2019 0412   MCV 98.1 11/23/2019 0412   MCH 32.5 11/23/2019 0412   MCHC 33.1 11/23/2019 0412   RDW 25.2 (H) 11/23/2019 0412   LYMPHSABS 3.5 11/23/2019 0412   MONOABS 0.4 11/23/2019 0412   EOSABS 0.3 11/23/2019 0412   BASOSABS 0.1 11/23/2019 0412   CMP     Component Value Date/Time   NA 138 11/23/2019 0412   K 4.1 11/23/2019 0412   CL 105 11/23/2019 0412   CO2 25 11/23/2019 0412   GLUCOSE 151 (H) 11/23/2019 0412   BUN 20 11/23/2019 0412   CREATININE 0.59 11/23/2019 0412   CALCIUM 8.7 (L) 11/23/2019 0412   PROT 7.4 11/23/2019 0412   ALBUMIN 2.5 (L) 11/23/2019 0412   AST 113 (H) 11/23/2019 0412   ALT 62 (H) 11/23/2019 0412   ALKPHOS 198 (H) 11/23/2019 0412   BILITOT 5.2 (H) 11/23/2019 0412   GFRNONAA >60 11/23/2019 0412   GFRAA >60 11/23/2019 0412   Assessment:  1.  Elevated LFTs. 2.  Altered mental status, improving.  Plan:  1.  Liver biopsy planned Monday 11/25/19 by Interventional radiology. 2.  [facility] will follow-up early next week after liver biopsy.   Landry Dyke 11/23/2019, 2:58 PM   Cell [phone] If no answer or after 5 PM call [phone]

## 2019-11-23 NOTE — Consult Note (Signed)
Chief Complaint: Patient was seen in consultation today for liver core biopsy Chief Complaint  Patient presents with  . Weakness  . Abdominal Pain   at the request of Dr Cleaster Corin   Supervising Physician: Sandi Mariscal  Patient Status: [facility] - In-pt  History of Present Illness: Wendy Barber is a 59 y.o. 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 Arthritis   . Back pain   . Cancer [facility])    pseudomyxoma peritonei  . Chronic fatigue syndrome   . Diabetes mellitus without complication ([employer])   . Fibromyalgia   . Hypertension     Past Surgical History:  Procedure Laterality Date  . ABDOMINAL HYSTERECTOMY    . ABDOMINAL SURGERY    . ACHILLES TENDON REPAIR    . APPENDECTOMY    . ARTHROPLASTY    . CARPAL TUNNEL RELEASE    . CHOLECYSTECTOMY    . JOINT REPLACEMENT    . KNEE ARTHROSCOPY    . ORIF ANKLE FRACTURE Right 08/27/2019   Procedure: OPEN REDUCTION INTERNAL FIXATION RIGHT ANKLE FRACTURE;  Surgeon: Renette Butters, MD;  Location: [facility] ORS;  Service: Orthopedics;  Laterality: Right;  . perineorrophy    . TONGUE BIOPSY      Allergies: Codeine, Penicillins, Alprazolam, Ativan [lorazepam], Corticosteroids, Erythromycin, Erythromycin base, Milnacipran hcl, Prednisone, Savella  [milnacipran], Povidone iodine, and Prednisolone  Medications: Prior to Admission medications   Medication Sig Start Date End Date Taking? Authorizing Provider  amLODIPine-Valsartan-HCTZ 5-160-12.5 MG TABS Take 1 tablet by mouth every morning.    Yes [provider]  Azathioprine (AZASAN) 75 MG TABS Take 150 mg by mouth daily.   Yes [provider]  busPIRone  (BUSPAR) 5 MG tablet Take 5 mg by mouth 3 (three) times daily as needed (anxiety).    Yes [provider]  celecoxib (CELEBREX) 200 MG capsule Take 200 mg by mouth daily.   Yes [provider]  Cholecalciferol (VITAMIN D) 2000 UNITS tablet Take 2,000 Units by mouth daily.   Yes [provider]  Coenzyme Q10 (CO Q-10) 100 MG CAPS Take 1 capsule by mouth at bedtime.   Yes [provider]  Dulaglutide (TRULICITY) [id number] [id number] SOPN Inject 0.75 mg into the skin once a week. Tuesday   Yes [provider]  DULoxetine (CYMBALTA) 60 MG capsule Take 60 mg by mouth 2 (two) times daily.   Yes [provider]  ferrous sulfate 325 (65 FE) MG EC tablet Take 325 mg by mouth 3 (three) times a week.    Yes [provider]  insulin glargine (LANTUS) 100 UNIT/ML injection Inject 22 Units into the skin at bedtime. Before bed    Yes [provider]  insulin lispro (HUMALOG) 100 UNIT/ML injection Inject 12-14 Units into the skin 3 (three) times daily before meals.    Yes [provider]  nitrofurantoin (MACRODANTIN) 50 MG capsule Take 50 mg by mouth at bedtime.    Yes [provider]  omeprazole (PRILOSEC) 40 MG capsule Take 40 mg by mouth at bedtime.   Yes [provider]  ondansetron (ZOFRAN) 4 MG tablet Take 1 tablet (4 mg total) by mouth every 8 (eight) hours as needed for nausea or vomiting. Patient taking  differently: Take 4 mg by mouth daily.  08/27/19  Yes Prudencio Burly III, PA-C  pregabalin (LYRICA) 150 MG capsule Take 150-300 mg by mouth See admin instructions. Take 150 mg in am & Take 300 mg in pm.    Yes [provider]  propranolol (INDERAL) 20 MG tablet Take 20 mg by mouth 2 (two) times daily.    Yes [provider]  metFORMIN (GLUCOPHAGE) 500 MG tablet Take 1,000 mg by mouth 2 (two) times daily with a meal.     [provider]     History reviewed. No pertinent family  history.  Social History   Socioeconomic History  . Marital status: Married    Spouse name: Not on file  . Number of children: Not on file  . Years of education: Not on file  . Highest education level: Not on file  Occupational History  . Not on file  Tobacco Use  . Smoking status: Never Smoker  . Smokeless tobacco: Never Used  Substance and Sexual Activity  . Alcohol use: No  . Drug use: Never  . Sexual activity: Not on file  Other Topics Concern  . Not on file  Social History Narrative  . Not on file   Social Determinants of Health   Financial Resource Strain:   . Difficulty of Paying Living Expenses: Not on file  Food Insecurity:   . Worried About Charity fundraiser in the Last Year: Not on file  . Ran Out of Food in the Last Year: Not on file  Transportation Needs:   . Lack of Transportation (Medical): Not on file  . Lack of Transportation (Non-Medical): Not on file  Physical Activity:   . Days of Exercise per Week: Not on file  . Minutes of Exercise per Session: Not on file  Stress:   . Feeling of Stress : Not on file  Social Connections:   . Frequency of Communication with Friends and Family: Not on file  . Frequency of Social Gatherings with Friends and Family: Not on file  . Attends Religious Services: Not on file  . Active Member of Clubs or Organizations: Not on file  . Attends Archivist Meetings: Not on file  . Marital Status: Not on file    Review of Systems: A 12 point ROS discussed and pertinent positives are indicated in the HPI above.  All other systems are negative.  Review of Systems  Constitutional: Positive for activity change, appetite change and fatigue. Negative for fever.  Respiratory: Negative for cough and shortness of breath.   Cardiovascular: Negative for chest pain.  Gastrointestinal: Positive for abdominal pain, nausea and vomiting.  Neurological: Positive for weakness.  Psychiatric/Behavioral: Positive for confusion.      Vital Signs: BP 129/78 (BP Location: Right Arm)   Pulse 66   Temp 97.9 F (36.6 C) (Oral)   Resp 16   Ht 5\' 6"  (1.676 m)   Wt 267 lb 3.2 oz (121.2 kg)   SpO2 98%   BMI 43.13 kg/m   Physical Exam Vitals reviewed.  Cardiovascular:     Rate and Rhythm: Normal rate and regular rhythm.     Heart sounds: Normal heart sounds.  Pulmonary:     Breath sounds: Normal breath sounds.  Abdominal:     Palpations: Abdomen is soft.  Skin:    General: Skin is warm and dry.  Neurological:     Mental Status: She is disoriented.  Psychiatric:  Comments: Husband at bedside Consented for procedure     Imaging: CT Head Wo Contrast  Result Date: 11/21/2019 CLINICAL DATA:  Weakness and uncontrolled jerking. EXAM: CT HEAD WITHOUT CONTRAST TECHNIQUE: Contiguous axial images were obtained from the base of the skull through the vertex without intravenous contrast. COMPARISON:  08/22/2019 FINDINGS: Brain: No evidence of acute infarction, hemorrhage, hydrocephalus, extra-axial collection. There is a dural-based calcification projecting inferiorly from the midline tentorium and measuring 13 mm, stable. Motion artifact towards the vertex and at the skull base. Vascular: No hyperdense vessel or unexpected calcification. Skull: Normal. Negative for fracture or focal lesion. Sinuses/Orbits: Negative IMPRESSION: 1. Motion degraded head CT without acute finding. 2. 13 mm dural based calcification along the tentorium, suspect meningioma. Electronically Signed   By: Monte Fantasia M.D.   On: 11/21/2019 05:18   VAS [place] LOWER EXTREMITY VENOUS (DVT)  Result Date: 10/28/2019  Lower Venous Study Indications: Patient with bilateral leg pain and swelling for many weeks.  Risk Factors: Patient reports history of DVT and PE, no records available in EPIC. Also reports she has an IVC filter placed. Surgery: Right ankle fracture repair 08/27/2019. Anticoagulation: Eliquis from 08/27/19 to 09/26/19 for DVT prophlyaxis s/p  surgery. Performing Technologist: Mariane Masters RVT  Examination Guidelines: A complete evaluation includes B-mode imaging, spectral Doppler, color Doppler, and power Doppler as needed of all accessible portions of each vessel. Bilateral testing is considered an integral part of a complete examination. Limited examinations for reoccurring indications may be performed as noted.  +---------+---------------+---------+-----------+----------+--------------+ RIGHT    CompressibilityPhasicitySpontaneityPropertiesThrombus Aging +---------+---------------+---------+-----------+----------+--------------+ CFV      Full           Yes      Yes                                 +---------+---------------+---------+-----------+----------+--------------+ SFJ      Full           Yes      Yes                                 +---------+---------------+---------+-----------+----------+--------------+ FV Prox  Full           Yes      Yes                                 +---------+---------------+---------+-----------+----------+--------------+ FV Mid   Full           Yes      Yes                                 +---------+---------------+---------+-----------+----------+--------------+ FV DistalFull           Yes      Yes                                 +---------+---------------+---------+-----------+----------+--------------+ PFV      Full                                                        +---------+---------------+---------+-----------+----------+--------------+  POP      Full           Yes      Yes                                 +---------+---------------+---------+-----------+----------+--------------+ PTV      Full           Yes      Yes                                 +---------+---------------+---------+-----------+----------+--------------+ PERO     Full           Yes      Yes                                  +---------+---------------+---------+-----------+----------+--------------+ Gastroc  Full                                                        +---------+---------------+---------+-----------+----------+--------------+ GSV      Full           Yes      Yes                                 +---------+---------------+---------+-----------+----------+--------------+   +---------+---------------+---------+-----------+----------+--------------+ LEFT     CompressibilityPhasicitySpontaneityPropertiesThrombus Aging +---------+---------------+---------+-----------+----------+--------------+ CFV      Full           Yes      Yes                                 +---------+---------------+---------+-----------+----------+--------------+ SFJ      Full           Yes      Yes                                 +---------+---------------+---------+-----------+----------+--------------+ FV Prox  Full           Yes      Yes                                 +---------+---------------+---------+-----------+----------+--------------+ FV Mid   Full           Yes      Yes                                 +---------+---------------+---------+-----------+----------+--------------+ FV DistalFull           Yes      Yes                                 +---------+---------------+---------+-----------+----------+--------------+ PFV      Full                                                        +---------+---------------+---------+-----------+----------+--------------+  POP      Full           Yes      Yes                                 +---------+---------------+---------+-----------+----------+--------------+ PTV      Full           Yes      Yes                                 +---------+---------------+---------+-----------+----------+--------------+ PERO     Full           Yes      Yes                                  +---------+---------------+---------+-----------+----------+--------------+ Gastroc  Full                                                        +---------+---------------+---------+-----------+----------+--------------+ GSV      Full           Yes      Yes                                 +---------+---------------+---------+-----------+----------+--------------+    Findings reported to Rocco Pauls, PA-C at 3:45 pm.  Summary: Right: No evidence of deep vein thrombosis in the lower extremity. No indirect evidence of obstruction proximal to the inguinal ligament. No cystic structure found in the popliteal fossa. Left: No evidence of deep vein thrombosis in the lower extremity. No indirect evidence of obstruction proximal to the inguinal ligament. No cystic structure found in the popliteal fossa.  *See table(s) above for measurements and observations. Electronically signed by Kathlyn Sacramento MD on 10/28/2019 at 4:14:00 PM.    Final     Labs:  CBC: Recent Labs    08/22/19 1329 11/21/19 0015 11/22/19 0424 11/23/19 0412  WBC 9.6 6.5 5.9 6.6  HGB 12.5 10.3* 9.5* 10.1*  HCT 39.1 31.1* 28.9* 30.5*  PLT 378 384 376 379    COAGS: Recent Labs    11/23/19 0412  INR 1.3*    BMP: Recent Labs    08/22/19 1329 11/21/19 0015 11/22/19 0424 11/23/19 0412  NA 136 137 135 138  K 3.6 4.5 4.4 4.1  CL 103 101 104 105  CO2 23 27 29 25   GLUCOSE 128* 137* 202* 151*  BUN 34* 24* 24* 20  CALCIUM 9.5 9.0 8.5* 8.7*  CREATININE 0.89 0.71 0.85 0.59  GFRNONAA >60 >60 >60 >60  GFRAA >60 >60 >60 >60    LIVER FUNCTION TESTS: Recent Labs    11/21/19 0015 11/22/19 0424 11/23/19 0412  BILITOT 5.8* 4.5* 5.2*  AST 146* 113* 113*  ALT 74* 62* 62*  ALKPHOS 218* 206* 198*  PROT 7.8 7.0 7.4  ALBUMIN 2.7* 2.4* 2.5*    TUMOR MARKERS: No results for input(s): AFPTM, CEA, CA199, CHROMGRNA in the last 8760 hours.  Assessment and Plan:  Hepatic encephalopathy Elevated LFTs Autoimmune  hepatitis Scheduled for random liver biopsy 2/1  in IR Risks and benefits of random liver bx was discussed with the patient and/or patient's family including, but not limited to bleeding, infection, damage to adjacent structures or low yield requiring additional tests.  All of the questions were answered and there is agreement to proceed.  Consent signed and in chart.     Thank you for this interesting consult.  I greatly enjoyed meeting Wendy Barber and look forward to participating in their care.  A copy of this report was sent to the requesting provider on this date.  Electronically Signed: Lavonia Drafts, PA-C 11/23/2019, 1:58 PM   I spent a total of 20 Minutes    in face to face in clinical consultation, greater than 50% of which was counseling/coordinating care for random liver biopsy

## 2019-11-23 NOTE — Evaluation (Signed)
Physical Therapy Evaluation Patient Details Name: Wendy Barber MRN: [id number] DOB: 12/01/1960 Today's Date: 11/23/2019   History of Present Illness  Pt admitted with weakness, lethargy and confusion 2* probable hepatic encephalopathy.  Pt with hx of DM, Chronic fatigue syndrome and R ankle fx 08/27/19  Clinical Impression  Pt admitted as above and presenting with functional mobility limitations 2* generalized weakness, dizziness with OOB activity, poor endurance and balance deficits.  Pt should progress to dc home with assist of family.    Follow Up Recommendations No PT follow up    Equipment Recommendations  None recommended by PT    Recommendations for Other Services       Precautions / Restrictions Precautions Precautions: Fall Precaution Comments: c/o dizziness  Restrictions Weight Bearing Restrictions: No      Mobility  Bed Mobility Overal bed mobility: Modified Independent             General bed mobility comments: Pt supine<>sit unassisted  Transfers Overall transfer level: Needs assistance Equipment used: Rolling walker (2 wheeled) Transfers: Sit to/from Stand Sit to Stand: Min guard         General transfer comment: steady assist only  Ambulation/Gait Ambulation/Gait assistance: Min assist Gait Distance (Feet): 38 Feet Assistive device: Rolling walker (2 wheeled) Gait Pattern/deviations: Step-to pattern;Step-through pattern;Decreased step length - right;Decreased step length - left;Shuffle;Staggering left;Staggering right;Wide base of support Gait velocity: decr   General Gait Details: general instability with c/o dizziness and fatigue with OOB activity  Stairs            Wheelchair Mobility    Modified Rankin (Stroke Patients Only)       Balance Overall balance assessment: Needs assistance Sitting-balance support: No upper extremity supported;Feet supported Sitting balance-Leahy Scale: Good     Standing balance support:  Bilateral upper extremity supported Standing balance-Leahy Scale: Poor Standing balance comment: reliant on UE support for balance                             Pertinent Vitals/Pain Pain Assessment: No/denies pain    Home Living Family/patient expects to be discharged to:: Private residence Living Arrangements: Spouse/significant other Available Help at Discharge: Family Type of Home: House Home Access: Stairs to enter Entrance Stairs-Rails: Right Entrance Stairs-Number of Steps: 5 Home Layout: One level Home Equipment: Environmental consultant - 2 wheels;Cane - single point      Prior Function Level of Independence: Independent               Hand Dominance        Extremity/Trunk Assessment   Upper Extremity Assessment Upper Extremity Assessment: Generalized weakness    Lower Extremity Assessment Lower Extremity Assessment: Generalized weakness       Communication   Communication: No difficulties  Cognition Arousal/Alertness: Awake/alert Behavior During Therapy: Flat affect Overall Cognitive Status: Impaired/Different from baseline Area of Impairment: Problem solving                             Problem Solving: Slow processing General Comments: delayed response to questions and delayed follow through to cues      General Comments      Exercises     Assessment/Plan    PT Assessment Patient needs continued PT services  PT Problem List Decreased strength;Decreased activity tolerance;Decreased balance;Decreased mobility;Decreased cognition;Decreased knowledge of use of DME;Obesity       PT Treatment Interventions DME instruction;Gait  training;Stair training;Functional mobility training;Therapeutic activities;Therapeutic exercise;Patient/family education;Balance training    PT Goals (Current goals can be found in the Care Plan section)  Acute Rehab PT Goals Patient Stated Goal: Regain IND PT Goal Formulation: With patient Time For Goal  Achievement: 12/07/19 Potential to Achieve Goals: Good    Frequency Min 3X/week   Barriers to discharge        Co-evaluation               AM-PAC PT "6 Clicks" Mobility  Outcome Measure Help needed turning from your back to your side while in a flat bed without using bedrails?: None Help needed moving from lying on your back to sitting on the side of a flat bed without using bedrails?: None Help needed moving to and from a bed to a chair (including a wheelchair)?: A Little Help needed standing up from a chair using your arms (e.g., wheelchair or bedside chair)?: A Little Help needed to walk in hospital room?: A Little Help needed climbing 3-5 steps with a railing? : A Lot 6 Click Score: 19    End of Session Equipment Utilized During Treatment: Gait belt Activity Tolerance: Patient limited by fatigue Patient left: in bed;with call bell/phone within reach;with family/visitor present Nurse Communication: Mobility status PT Visit Diagnosis: Unsteadiness on feet (R26.81);Muscle weakness (generalized) (M62.81);Difficulty in walking, not elsewhere classified (R26.2);Dizziness and giddiness (R42)    Time: 1010-1030 PT Time Calculation (min) (ACUTE ONLY): 20 min   Charges:   PT Evaluation $PT Eval Low Complexity: 1 Low          [place] Pager [phone] Office [phone]   Shayla Heming 11/23/2019, 12:03 PM

## 2019-11-24 LAB — CBC WITH DIFFERENTIAL/PLATELET
Abs Immature Granulocytes: 0.03 10*3/uL (ref 0.00–0.07)
Basophils Absolute: 0.1 10*3/uL (ref 0.0–0.1)
Basophils Relative: 1 %
Eosinophils Absolute: 0.3 10*3/uL (ref 0.0–0.5)
Eosinophils Relative: 4 %
HCT: 29.4 % — ABNORMAL LOW (ref 36.0–46.0)
Hemoglobin: 10 g/dL — ABNORMAL LOW (ref 12.0–15.0)
Immature Granulocytes: 0 %
Lymphocytes Relative: 58 %
Lymphs Abs: 4.2 10*3/uL — ABNORMAL HIGH (ref 0.7–4.0)
MCH: 32.3 pg (ref 26.0–34.0)
MCHC: 34 g/dL (ref 30.0–36.0)
MCV: 94.8 fL (ref 80.0–100.0)
Monocytes Absolute: 0.4 10*3/uL (ref 0.1–1.0)
Monocytes Relative: 5 %
Neutro Abs: 2.4 10*3/uL (ref 1.7–7.7)
Neutrophils Relative %: 32 %
Platelets: 421 10*3/uL — ABNORMAL HIGH (ref 150–400)
RBC: 3.1 MIL/uL — ABNORMAL LOW (ref 3.87–5.11)
RDW: 26 % — ABNORMAL HIGH (ref 11.5–15.5)
WBC: 7.4 10*3/uL (ref 4.0–10.5)
nRBC: 3.6 % — ABNORMAL HIGH (ref 0.0–0.2)

## 2019-11-24 LAB — COMPREHENSIVE METABOLIC PANEL
ALT: 59 U/L — ABNORMAL HIGH (ref 0–44)
AST: 106 U/L — ABNORMAL HIGH (ref 15–41)
Albumin: 2.4 g/dL — ABNORMAL LOW (ref 3.5–5.0)
Alkaline Phosphatase: 198 U/L — ABNORMAL HIGH (ref 38–126)
Anion gap: 6 (ref 5–15)
BUN: 18 mg/dL (ref 6–20)
CO2: 27 mmol/L (ref 22–32)
Calcium: 8.8 mg/dL — ABNORMAL LOW (ref 8.9–10.3)
Chloride: 104 mmol/L (ref 98–111)
Creatinine, Ser: 0.57 mg/dL (ref 0.44–1.00)
GFR calc Af Amer: >60 mL/min (ref >60)
GFR calc non Af Amer: >60 mL/min (ref >60)
Glucose, Bld: 157 mg/dL — ABNORMAL HIGH (ref 70–99)
Potassium: 4.1 mmol/L (ref 3.5–5.1)
Sodium: 137 mmol/L (ref 135–145)
Total Bilirubin: 4.8 mg/dL — ABNORMAL HIGH (ref 0.3–1.2)
Total Protein: 7.1 g/dL (ref 6.5–8.1)

## 2019-11-24 LAB — GLUCOSE, CAPILLARY
Glucose-Capillary: 133 mg/dL — ABNORMAL HIGH (ref 70–99)
Glucose-Capillary: 184 mg/dL — ABNORMAL HIGH (ref 70–99)
Glucose-Capillary: 195 mg/dL — ABNORMAL HIGH (ref 70–99)
Glucose-Capillary: 163 mg/dL — ABNORMAL HIGH (ref 70–99)

## 2019-11-24 LAB — PROTIME-INR
INR: 1.2 (ref 0.8–1.2)
Prothrombin Time: 15.2 seconds (ref 11.4–15.2)

## 2019-11-24 LAB — AMMONIA: Ammonia: 98 umol/L — ABNORMAL HIGH (ref 9–35)

## 2019-11-24 MED ORDER — LACTULOSE 10 GM/15ML PO SOLN
30.0000 g | Freq: Three times a day (TID) | ORAL | Status: DC
  Administered 2019-11-24 – 2019-11-26 (×6): 30 g via ORAL
  Filled 2019-11-24 (×6): qty 45

## 2019-11-24 NOTE — Progress Notes (Addendum)
Hospitalist progress note   Patient from home, Patient going  liekly home, Dispo-expect will need liver biopsy and d/c in ~ 48 h oince clearer mentally  Wendy Barber [id number] DOB: 12-04-1960 DOA: 11/21/2019  PCP: Glendon Axe, MD   Narrative:  59 year old white female disseminated peritoneal adeno mucinosis of appendix stage IV 2005 status post intraperitoneal chemotherapy + anterior resection + splenectomy lobectomy of liver ATC Prior TAH/BSO 04/2003- DM TY 2, bipolar, iron deficiency anemia, HTN Admit with increasing weakness lethargy jaundice-diagnosed autoimmune hepatitis sees Dr. Alan Ripper [facility], taking azathioprine Admitted with 2 weeks of confusion forgetfulness and lethargy On admission ammonia found to be 50 CT head showed a meningioma with no acute intracranial abnormalities LFTs altered  Dr. Twana First Shahid notes summarized in 1/30 PN  Data Reviewed:  Last labs from [facility] showed ALT AST 24/2.2 bilirubin 0.5 alk phos 112  BUN/creatinine 24/0.8 Ammonia 118 bilirubin 4.5 alk phos 206 AST ALT 113/62 Hemoglobin 9.5 platelet 376 White count 5.9  Assessment & Plan:  Autoimmune hepatitis vs acute effect of Azathiopurine lft up--Liver biopsy likely in am Probable hepatic encephalopathy Increase lactulose to 30 tid given still a little slow mentation rpt Ammonia in am History of DVT and PE with IVC filter No anticoagulation at this time other than prophylactic  Disseminated adeno mucinosis appendix stage IV status post surgery 2005 Prior history of the same DM TY 2 CBG 133-157 -[PTA Lantus 22 and 3 times daily 12 to 14 units holding also at this time Metformin 1000 twice daily and Trulicity 9.45 weekly] sliding scale for now IDA Monitor trends- HTN Holding at this time combination blood pressure medication-at this time continue Inderal 20 twice daily Bipolar Resume in the next several days BuSpar 5 3 times daily Would hold for now Lyrica NO  NARCOTICS or Sleep aides   Subjective:  Mentation fair-received however trazadone last pm Remembers mother -in-law coming into town this am Tells me walked in halls yesterday No cp f chills--slow responses but about the same vs mild improvement over yesterday  Consultants:   None yet  Objective: Vitals:   11/23/19 0606 11/23/19 1323 11/23/19 2049 11/24/19 0634  BP: (!) 156/81 129/78 (!) 151/80 123/64  Pulse: 65 66 68 67  Resp: _0 Temp: 97.8 F (36.6 C) 97.9 F (36.6 C) 97.9 F (36.6 C) 98.2 F (36.8 C)  TempSrc: Oral Oral Oral Oral  SpO2: 98% 98% 94% 95%  Weight:      Height:       No intake or output data in the 24 hours ending 11/24/19 1103 Filed Weights   11/21/19 0018 11/21/19 0931  Weight: 117.9 kg 121.2 kg    Examination:  slight sleepy overall coherent at times misses one or 2 things--states at hospital can tell year Thick neck no flap cta b no adde sound abd obese no HSM No le edema rom intact   Scheduled Meds: . ferrous sulfate  325 mg Oral Q M,W,F  . insulin aspart  0-5 Units Subcutaneous QHS  . insulin aspart  0-9 Units Subcutaneous TID WC  . lactulose  30 g Oral TID  . pantoprazole  40 mg Oral Daily  . propranolol  20 mg Oral BID   Continuous Infusions:   LOS: 3 days   Time spent: [place], MD [facility]  11/24/2019, 11:03 AM

## 2019-11-25 ENCOUNTER — Inpatient Hospital Stay (HOSPITAL_COMMUNITY): Payer: Medicare Other

## 2019-11-25 HISTORY — PX: IR US GUIDE BX ASP/DRAIN: IMG2392

## 2019-11-25 LAB — CBC WITH DIFFERENTIAL/PLATELET
Abs Immature Granulocytes: 0.03 10*3/uL (ref 0.00–0.07)
Basophils Absolute: 0.1 10*3/uL (ref 0.0–0.1)
Basophils Relative: 2 %
Eosinophils Absolute: 0.2 10*3/uL (ref 0.0–0.5)
Eosinophils Relative: 4 %
HCT: 33.1 % — ABNORMAL LOW (ref 36.0–46.0)
Hemoglobin: 11.1 g/dL — ABNORMAL LOW (ref 12.0–15.0)
Immature Granulocytes: 0 %
Lymphocytes Relative: 55 %
Lymphs Abs: 3.8 10*3/uL (ref 0.7–4.0)
MCH: 32 pg (ref 26.0–34.0)
MCHC: 33.5 g/dL (ref 30.0–36.0)
MCV: 95.4 fL (ref 80.0–100.0)
Monocytes Absolute: 0.4 10*3/uL (ref 0.1–1.0)
Monocytes Relative: 6 %
Neutro Abs: 2.3 10*3/uL (ref 1.7–7.7)
Neutrophils Relative %: 33 %
Platelets: 468 10*3/uL — ABNORMAL HIGH (ref 150–400)
RBC: 3.47 MIL/uL — ABNORMAL LOW (ref 3.87–5.11)
RDW: 26.7 % — ABNORMAL HIGH (ref 11.5–15.5)
WBC: 6.8 10*3/uL (ref 4.0–10.5)
nRBC: 5.6 % — ABNORMAL HIGH (ref 0.0–0.2)

## 2019-11-25 LAB — COMPREHENSIVE METABOLIC PANEL
ALT: 65 U/L — ABNORMAL HIGH (ref 0–44)
AST: 117 U/L — ABNORMAL HIGH (ref 15–41)
Albumin: 2.7 g/dL — ABNORMAL LOW (ref 3.5–5.0)
Alkaline Phosphatase: 222 U/L — ABNORMAL HIGH (ref 38–126)
Anion gap: 11 (ref 5–15)
BUN: 19 mg/dL (ref 6–20)
CO2: 24 mmol/L (ref 22–32)
Calcium: 8.7 mg/dL — ABNORMAL LOW (ref 8.9–10.3)
Chloride: 104 mmol/L (ref 98–111)
Creatinine, Ser: 0.63 mg/dL (ref 0.44–1.00)
GFR calc Af Amer: >60 mL/min (ref >60)
GFR calc non Af Amer: >60 mL/min (ref >60)
Glucose, Bld: 172 mg/dL — ABNORMAL HIGH (ref 70–99)
Potassium: 3.6 mmol/L (ref 3.5–5.1)
Sodium: 139 mmol/L (ref 135–145)
Total Bilirubin: 4.2 mg/dL — ABNORMAL HIGH (ref 0.3–1.2)
Total Protein: 8.1 g/dL (ref 6.5–8.1)

## 2019-11-25 LAB — GLUCOSE, CAPILLARY
Glucose-Capillary: 146 mg/dL — ABNORMAL HIGH (ref 70–99)
Glucose-Capillary: 139 mg/dL — ABNORMAL HIGH (ref 70–99)
Glucose-Capillary: 191 mg/dL — ABNORMAL HIGH (ref 70–99)
Glucose-Capillary: 141 mg/dL — ABNORMAL HIGH (ref 70–99)

## 2019-11-25 LAB — AMMONIA: Ammonia: 52 umol/L — ABNORMAL HIGH (ref 9–35)

## 2019-11-25 LAB — PROTIME-INR
INR: 1.1 (ref 0.8–1.2)
Prothrombin Time: 14.3 seconds (ref 11.4–15.2)

## 2019-11-25 MED ORDER — LIDOCAINE HCL 1 % IJ SOLN
INTRAMUSCULAR | Status: AC
  Filled 2019-11-25: qty 20

## 2019-11-25 MED ORDER — FENTANYL CITRATE (PF) 100 MCG/2ML IJ SOLN
INTRAMUSCULAR | Status: AC | PRN
  Administered 2019-11-25 (×2): 50 ug via INTRAVENOUS

## 2019-11-25 MED ORDER — MIDAZOLAM HCL 2 MG/2ML IJ SOLN
INTRAMUSCULAR | Status: AC
  Filled 2019-11-25: qty 4

## 2019-11-25 MED ORDER — GELATIN ABSORBABLE 12-7 MM EX MISC
CUTANEOUS | Status: AC
  Administered 2019-11-25: 1 via INTRAMUSCULAR
  Filled 2019-11-25: qty 1

## 2019-11-25 MED ORDER — HYDROCORTISONE 1 % EX LOTN
TOPICAL_LOTION | Freq: Two times a day (BID) | CUTANEOUS | Status: DC
  Filled 2019-11-25: qty 118

## 2019-11-25 MED ORDER — FENTANYL CITRATE (PF) 100 MCG/2ML IJ SOLN
INTRAMUSCULAR | Status: AC
  Filled 2019-11-25: qty 2

## 2019-11-25 MED ORDER — MIDAZOLAM HCL 2 MG/2ML IJ SOLN
INTRAMUSCULAR | Status: AC | PRN
  Administered 2019-11-25: 1 mg via INTRAVENOUS

## 2019-11-25 NOTE — Progress Notes (Signed)
Physical Therapy Treatment Patient Details Name: Wendy Barber MRN: [id number] DOB: [birth year] Today's Date: 11/25/2019    History of Present Illness Pt admitted with weakness, lethargy and confusion 2* probable hepatic encephalopathy.  Pt with hx of DM, Chronic fatigue syndrome and R ankle fx 08/27/19    PT Comments    Patient making gradual progress however remains limited by fatigue and lightheadedness. She was able to performed bed mob without assistance and min guard for sit<>stand transfers. Pt began taking short steps with RW in room and was unsteady requiring min assist to steady and prevent LOB. Pt limited to several short steps and pivot to recliner due to dizziness. This subsided in sitting and pt was instructed in benefits of sit<>stands for LE strengthening. She will continue to benefit from skilled PT to progress impairments to regain functional independence. Acute PT will progress as able.   Follow Up Recommendations  No PT follow up     Equipment Recommendations  None recommended by PT    Recommendations for Other Services       Precautions / Restrictions Precautions Precautions: Fall Restrictions Weight Bearing Restrictions: No    Mobility  Bed Mobility Overal bed mobility: Modified Independent Bed Mobility: Supine to Sit     Supine to sit: Supervision     General bed mobility comments: Pt supine<>sit unassisted  Transfers Overall transfer level: Needs assistance Equipment used: Rolling walker (2 wheeled) Transfers: Sit to/from [facility] Sit to Stand: Min guard Stand pivot transfers: Min guard       General transfer comment: cues for hand placement and technique, steady assist for safety, pt performed short steps for gait and then required a stand step/pivot to sit in chair due to dizziness/"wooziness"  Ambulation/Gait Ambulation/Gait assistance: Min assist Gait Distance (Feet): 4 Feet Assistive device: Rolling walker (2  wheeled) Gait Pattern/deviations: Step-to pattern;Step-through pattern;Decreased step length - right;Decreased step length - left;Shuffle;Staggering left;Staggering right;Wide base of support Gait velocity: decr   General Gait Details: pt slow and unsteady, complained of dizziness with short distance in hospital room and unsteady requiring min assist to prevent LOB.   Stairs             Wheelchair Mobility    Modified Rankin (Stroke Patients Only)       Balance Overall balance assessment: Needs assistance Sitting-balance support: No upper extremity supported;Feet supported Sitting balance-Leahy Scale: Good     Standing balance support: Bilateral upper extremity supported Standing balance-Leahy Scale: Poor Standing balance comment: reliant on UE support for balance           Cognition Arousal/Alertness: Awake/alert Behavior During Therapy: Flat affect Overall Cognitive Status: Impaired/Different from baseline Area of Impairment: Problem solving          Problem Solving: Slow processing General Comments: delayed response to questions and delayed follow through to cues      Exercises      General Comments        Pertinent Vitals/Pain Pain Assessment: No/denies pain           PT Goals (current goals can now be found in the care plan section) Acute Rehab PT Goals Patient Stated Goal: Regain IND PT Goal Formulation: With patient Time For Goal Achievement: 12/07/19 Potential to Achieve Goals: Good Progress towards PT goals: Progressing toward goals    Frequency    Min 3X/week      PT Plan Current plan remains appropriate       AM-PAC PT "6 Clicks" Mobility  Outcome Measure  Help needed turning from your back to your side while in a flat bed without using bedrails?: None Help needed moving from lying on your back to sitting on the side of a flat bed without using bedrails?: None Help needed moving to and from a bed to a chair (including a  wheelchair)?: A Little Help needed standing up from a chair using your arms (e.g., wheelchair or bedside chair)?: A Little Help needed to walk in hospital room?: A Little Help needed climbing 3-5 steps with a railing? : A Lot 6 Click Score: 19    End of Session Equipment Utilized During Treatment: Gait belt Activity Tolerance: Patient limited by fatigue(limited by "wooziness/dizziness") Patient left: in bed;with call bell/phone within reach;with family/visitor present Nurse Communication: Mobility status PT Visit Diagnosis: Unsteadiness on feet (R26.81);Muscle weakness (generalized) (M62.81);Difficulty in walking, not elsewhere classified (R26.2);Dizziness and giddiness (R42)     Time: [id number] PT Time Calculation (min) (ACUTE ONLY): 23 min  Charges:  $Therapeutic Exercise: 8-22 mins $Therapeutic Activity: 8-22 mins                     Verner Mould, DPT Physical Therapist with [facility] [phone]  11/25/2019 5:44 PM

## 2019-11-25 NOTE — Sedation Documentation (Signed)
Early v/s pt b/p elevated due to arm position

## 2019-11-25 NOTE — Progress Notes (Signed)
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 Alan Ripper [facility], taking azathioprine Admitted with 2 weeks of confusion forgetfulness and lethargy On admission ammonia found to be 50 CT head showed a meningioma with no acute intracranial abnormalities LFTs altered  Dr. Twana First Shahid notes summarized in 1/30 PN  Data Reviewed:  BUN/creatinine 24/0.8-->19/0.63 bilirubin 4.2 alk phos 222 AST ALT 117/65, INR 1.1 Ammonia now 52 Hemoglobin 11.1 platelet 468 White count 6.8  Assessment & Plan:  Autoimmune hepatitis vs acute effect of Azathiopurine lft up--Liver biopsy pending Probable hepatic encephalopathy Increase lactulose to 30 tid given still a little slow mentation Seems some improved--lack of sleep also a factor in presentation History of DVT and PE with IVC filter No anticoagulation at this time other than prophylactic Disseminated adeno mucinosis appendix stage IV status post surgery 2005 Prior history of the same DM TY 2 CBG 146-184 -[PTA Lantus 22 and 3 times daily 12 to 14 units holding also at this time Metformin 1000 twice daily and Trulicity 1.63 weekly] sliding scale only for now--resume LA insulin likely after biopsy IDA Monitor trends HTN Holding at this time combination blood pressure medication-at this time continue Inderal 20 twice daily Bipolar Resumed BuSpar 5 3 times daily Would hold for now Lyrica NO  NARCOTICS or Sleep aids  Long discussion with husband on 2/1 at [phone] baseline prior to this occurring after her fall in October with that she was able to converse clearly fluently, would be able to recall her credit card numbers 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 and she is pneumonitis according to husband he does feel that she is marginally improved but discussed this and is planning to have his mother help out while he is in town (usually lives in [place])-his daughter is a Marine scientist at [employer] and we discussed that we would need to see some improvement over the next 72 hours prior to making a decision about disposition  Subjective:  Awakens but does still seem somewhat sleepy--- answers but loses track occasionally what I am trying to say Recalls that she is going for liver biopsy today and this is to tell us what is going on with her liver No chest pain no other complaints   Consultants:   None yet  Objective: Vitals:   11/24/19 1321 11/24/19 2027 11/25/19 0615 11/25/19 1018  BP: 130/81 131/81 125/74 125/74  Pulse: 70 73 69 69  Resp: 19 17 16    Temp: 98.1 F (36.7 C) 98 F (36.7 C) 97.9 F (36.6 C)   TempSrc: Oral Oral Oral   SpO2: 95% 94% 94%   Weight:      Height:       No intake or output data in the 24 hours ending 11/25/19 1034 Filed Weights   11/21/19 0018 11/21/19 0931  Weight: 117.9 kg 121.2 kg    Examination:  slight sleepy -somewhat coherent Thick neck no flap cta b no adde sound abd obese no HSM No le edema rom  intact   Scheduled Meds: . ferrous sulfate  325 mg Oral Q M,W,F  . insulin aspart  0-5 Units Subcutaneous QHS  . insulin aspart  0-9 Units Subcutaneous TID WC  . lactulose  30 g Oral TID  . pantoprazole  40 mg Oral Daily  . propranolol  20 mg Oral BID   Continuous Infusions:   LOS: 4 days   Time spent: [place], MD [facility]  11/25/2019, 10:34 AM

## 2019-11-25 NOTE — Procedures (Signed)
Interventional Radiology Procedure Note  Procedure: US Guided Biopsy of Liver  Complications: None  Estimated Blood Loss: < 10 mL  Findings: 18 G core biopsy of liver performed under US guidance.  Two core samples obtained and sent to Pathology.  Shannon Kirkendall T. Carmen Tolliver, M.D Pager:  [phone]   

## 2019-11-25 NOTE — Sedation Documentation (Signed)
Family stated that pt has had versed given without any reaction. Pt had no adverse reaction to versed today.

## 2019-11-26 ENCOUNTER — Other Ambulatory Visit: Payer: Self-pay

## 2019-11-26 LAB — GLUCOSE, CAPILLARY
Glucose-Capillary: 132 mg/dL — ABNORMAL HIGH (ref 70–99)
Glucose-Capillary: 184 mg/dL — ABNORMAL HIGH (ref 70–99)

## 2019-11-26 LAB — CBC WITH DIFFERENTIAL/PLATELET
Abs Immature Granulocytes: 0.03 10*3/uL (ref 0.00–0.07)
Basophils Absolute: 0.1 10*3/uL (ref 0.0–0.1)
Basophils Relative: 2 %
Eosinophils Absolute: 0.2 10*3/uL (ref 0.0–0.5)
Eosinophils Relative: 3 %
HCT: 31.1 % — ABNORMAL LOW (ref 36.0–46.0)
Hemoglobin: 10.4 g/dL — ABNORMAL LOW (ref 12.0–15.0)
Immature Granulocytes: 1 %
Lymphocytes Relative: 51 %
Lymphs Abs: 3.2 10*3/uL (ref 0.7–4.0)
MCH: 32.5 pg (ref 26.0–34.0)
MCHC: 33.4 g/dL (ref 30.0–36.0)
MCV: 97.2 fL (ref 80.0–100.0)
Monocytes Absolute: 0.4 10*3/uL (ref 0.1–1.0)
Monocytes Relative: 6 %
Neutro Abs: 2.3 10*3/uL (ref 1.7–7.7)
Neutrophils Relative %: 37 %
Platelets: 448 10*3/uL — ABNORMAL HIGH (ref 150–400)
RBC: 3.2 MIL/uL — ABNORMAL LOW (ref 3.87–5.11)
RDW: 27.3 % — ABNORMAL HIGH (ref 11.5–15.5)
WBC: 6.2 10*3/uL (ref 4.0–10.5)
nRBC: 5.9 % — ABNORMAL HIGH (ref 0.0–0.2)

## 2019-11-26 LAB — COMPREHENSIVE METABOLIC PANEL
ALT: 61 U/L — ABNORMAL HIGH (ref 0–44)
AST: 105 U/L — ABNORMAL HIGH (ref 15–41)
Albumin: 2.4 g/dL — ABNORMAL LOW (ref 3.5–5.0)
Alkaline Phosphatase: 212 U/L — ABNORMAL HIGH (ref 38–126)
Anion gap: 7 (ref 5–15)
BUN: 18 mg/dL (ref 6–20)
CO2: 24 mmol/L (ref 22–32)
Calcium: 8.6 mg/dL — ABNORMAL LOW (ref 8.9–10.3)
Chloride: 106 mmol/L (ref 98–111)
Creatinine, Ser: 0.63 mg/dL (ref 0.44–1.00)
GFR calc Af Amer: >60 mL/min (ref >60)
GFR calc non Af Amer: >60 mL/min (ref >60)
Glucose, Bld: 157 mg/dL — ABNORMAL HIGH (ref 70–99)
Potassium: 3.8 mmol/L (ref 3.5–5.1)
Sodium: 137 mmol/L (ref 135–145)
Total Bilirubin: 3 mg/dL — ABNORMAL HIGH (ref 0.3–1.2)
Total Protein: 7.5 g/dL (ref 6.5–8.1)

## 2019-11-26 LAB — PROTIME-INR
INR: 1.1 (ref 0.8–1.2)
Prothrombin Time: 14.1 seconds (ref 11.4–15.2)

## 2019-11-26 MED ORDER — LACTULOSE 10 GM/15ML PO SOLN: 30.0000 g | Freq: Three times a day (TID) | ORAL | 0 refills | Status: DC

## 2019-11-26 MED ORDER — INSULIN GLARGINE 100 UNIT/ML ~~LOC~~ SOLN: 10.0000 [IU] | Freq: Every day | SUBCUTANEOUS | 11 refills | Status: DC

## 2019-11-26 MED ORDER — INSULIN LISPRO 100 UNIT/ML ~~LOC~~ SOLN: 8.0000 [IU] | Freq: Three times a day (TID) | SUBCUTANEOUS | 11 refills | Status: DC

## 2019-11-26 MED ORDER — PREGABALIN 75 MG PO CAPS: ORAL_CAPSULE | ORAL | 0 refills | Status: DC

## 2019-11-26 MED ORDER — DULOXETINE HCL 20 MG PO CPEP: 60.0000 mg | ORAL_CAPSULE | Freq: Two times a day (BID) | ORAL | 0 refills | Status: DC

## 2019-11-26 NOTE — Discharge Summary (Signed)
Physician Discharge Summary  Wendy Barber [id number] DOB: 02-Apr-1961 DOA: 11/21/2019  PCP: Glendon Axe, MD  Admit date: 11/21/2019 Discharge date: 11/26/2019  Time spent: 35 minutes  Recommendations for Outpatient Follow-up:  1. Outpatient follow-up with biopsy and decision about further therapeutics per her gastroenterologist at [facility] Dr. Princella Pellegrini 2. LFT, CBC, INR in 1 week 3. Gradual retitration of outpatient Lyrica BuSpar other meds including insulin 4. 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 Alan Ripper [facility], taking azathioprine Admitted with 2 weeks of confusion forgetfulness and lethargy On admission ammonia found to be 50 CT head showed a meningioma with no acute intracranial abnormalities LFTs altered  Hospital Course:  Autoimmune hepatitis vs acute effect of Azathiopurine lft up--Liver biopsy pending and I have discussed this with husband and they are amenable to [facility] Dr. Alan Ripper GI follow up soon No DMARD or immunomod for now--deciding based on biopsy as OP with her regular gastroenterologist Probable hepatic  encephalopathy Increase lactulose to 30 tid given still a little slow mentation Improved over course of past several days Rx called in to pharmacy History of DVT and PE with IVC filter No anticoagulation at this time other than prophylactic Disseminated adeno mucinosis appendix stage IV status post surgery 2005 Prior history of the same DM TY 2 CBG well controlled -[PTA Lantus 22 and 3 times daily 12 to 14 units holding also at this time Metformin 1000 twice daily and Trulicity A999333 weekly] Resumed meds at lower dose on d/c and metformin IDA Monitor trends HTN Holding at this time combination blood pressure medication-at this time continue Inderal 20 twice daily Bipolar Resumed BuSpar 5 3 times daily Lyrica resumed at low-dose on discharge and will need to be titrated in the outpatient setting  Procedures:  Liver biopsy 2/1  Consultations:  Gastroenterology  Discharge Exam: Vitals:   11/25/19 2030 11/26/19 0613  BP: 123/69 (!) 141/82  Pulse: 74 67  Resp: 18 20  Temp: 97.9 F (36.6 C) 98.7 F (37.1 C)  SpO2: 94% 92%    General: Awake alert coherent no distress EOMI NCAT much more with it more able to verbalize and discuss and able to do simple math Cardiovascular: S1-S2 no murmur rub or gallop  Respiratory: No rales no rhonchi no added sound Abdomen soft no rebound no guarding biopsy area clean she just had a little dry scab No asterixis ROM intact  Discharge Instructions   Discharge Instructions    Diet - low sodium heart healthy   Complete by: As directed    Discharge instructions   Complete by: As directed    You will notice that I have discontinued certain medications and/or change the doses of some medications that have a propensity to cause confusion  such as your Cymbalta or Lyrica so I have given you a refill at a lower dose which can be increased by her primary care physician within the next several weeks once he sees them Your biopsy results are not  available however the important thing is to continue the lactulose at the dose given to ensure 2-3 loose stools daily and ensure that your mental status remains stable I would recommend that you resume on some of your blood sugar medications in addition but at unchanged doses you follow-up with your primary care physician-I expect that you will be able to use the same doses of insulin that you are on previously once you appetite increases I have called in your prescription to your pharmacy I would suggest that Dr. Princella Pellegrini get a copy of your results and you can access the on my chart with regards to what the biopsy results show as you will need to have a discussion about next steps in terms of what might be the next agents you use for your hepatitis Best of luck and have a good spring   Increase activity slowly   Complete by: As directed    Increase activity slowly   Complete by: As directed      Allergies as of 11/26/2019      Reactions   Codeine Shortness Of Breath, Palpitations   Penicillins Shortness Of Breath   Other reaction(s): Irregular Heart Rate, Other (See Comments) Rapid heartrate   Alprazolam Hives, Other (See Comments)   Note: tolerates midazolam fine unresponsive Hard to arouse unresponsive Hard to arouse Other reaction(s): Other (See Comments) unresponsive   Ativan [lorazepam] Other (See Comments)   Note: tolerates midazolam fine Face & Throat Swelling.   Corticosteroids Other (See Comments)   Other reaction(s): Other (see comments) Psychotic behaivor Other reaction(s): Other (See Comments) Psychotic behaivor Psychotic behaivor Psychotic behaivor   Erythromycin    Other reaction(s): Other (See Comments) Severe stomach pain   Erythromycin Base Hives   Milnacipran Hcl Other (See Comments)   Other reaction(s): Other (see comments) psychotic episode psychotic episode Other reaction(s): Other (See Comments) psychotic episode   Prednisone Other (See Comments)    Anxiety & Nervous Breakdown.   Savella  [milnacipran]    Povidone Iodine Rash   Had rash under bandage after surgery , has had betadine since without reaction    Prednisolone Anxiety      Medication List    STOP taking these medications   Azasan 75 MG Tabs Generic drug: Azathioprine   celecoxib 200 MG capsule Commonly known as: CELEBREX   Co Q-10 100 MG Caps   Vitamin D 50 MCG (2000 UT) tablet     TAKE these medications   amLODIPine-Valsartan-HCTZ 5-160-12.5 MG Tabs Take 1 tablet by mouth every morning.   busPIRone 5 MG tablet Commonly known as: BUSPAR Take 5 mg by mouth 3 (three) times daily as needed (anxiety).   DULoxetine 20 MG capsule Commonly known as: CYMBALTA Take 3 capsules (60 mg total) by mouth 2 (two) times daily. What changed: medication strength   ferrous sulfate 325 (65 FE) MG EC tablet Take 325 mg by mouth 3 (three) times a week.   insulin glargine 100 UNIT/ML injection Commonly known as: LANTUS Inject 0.1 mLs (10 Units total) into the skin at bedtime. Before bed What changed: how much to take   insulin lispro 100 UNIT/ML injection Commonly known as: HUMALOG Inject 0.08 mLs (8 Units total) into the skin 3 (three) times daily before  meals. What changed: how much to take   lactulose 10 GM/15ML solution Commonly known as: CHRONULAC Take 45 mLs (30 g total) by mouth 3 (three) times daily.   metFORMIN 500 MG tablet Commonly known as: GLUCOPHAGE Take 1,000 mg by mouth 2 (two) times daily with a meal.   nitrofurantoin 50 MG capsule Commonly known as: MACRODANTIN Take 50 mg by mouth at bedtime.   omeprazole 40 MG capsule Commonly known as: PRILOSEC Take 40 mg by mouth at bedtime.   ondansetron 4 MG tablet Commonly known as: Zofran Take 1 tablet (4 mg total) by mouth every 8 (eight) hours as needed for nausea or vomiting. What changed: when to take this   pregabalin 75 MG capsule Commonly known as: LYRICA Take 1 capsule daily until you see  your primary What changed:   medication strength  how much to take  how to take this  when to take this  additional instructions   propranolol 20 MG tablet Commonly known as: INDERAL Take 20 mg by mouth 2 (two) times daily.   Trulicity [id number] [id number] Sopn Generic drug: Dulaglutide Inject 0.75 mg into the skin once a week. Tuesday      Allergies  Allergen Reactions  . Codeine Shortness Of Breath and Palpitations  . Penicillins Shortness Of Breath    Other reaction(s): Irregular Heart Rate, Other (See Comments) Rapid heartrate   . Alprazolam Hives and Other (See Comments)    Note: tolerates midazolam fine unresponsive Hard to arouse unresponsive Hard to arouse  Other reaction(s): Other (See Comments) unresponsive  . Ativan [Lorazepam] Other (See Comments)    Note: tolerates midazolam fine Face & Throat Swelling.  . Corticosteroids Other (See Comments)    Other reaction(s): Other (see comments) Psychotic behaivor Other reaction(s): Other (See Comments) Psychotic behaivor Psychotic behaivor Psychotic behaivor   . Erythromycin     Other reaction(s): Other (See Comments) Severe stomach pain   . Erythromycin Base Hives  . Milnacipran Hcl Other (See Comments)    Other reaction(s): Other (see comments) psychotic episode psychotic episode  Other reaction(s): Other (See Comments) psychotic episode  . Prednisone Other (See Comments)    Anxiety & Nervous Breakdown.  Wendy Barber  [Milnacipran]   . Povidone Iodine Rash    Had rash under bandage after surgery , has had betadine since without reaction   . Prednisolone Anxiety      The results of significant diagnostics from this hospitalization (including imaging, microbiology, ancillary and laboratory) are listed below for reference.    Significant Diagnostic Studies: CT Head Wo Contrast  Result Date: 11/21/2019 CLINICAL DATA:  Weakness and uncontrolled jerking. EXAM: CT HEAD WITHOUT CONTRAST TECHNIQUE:  Contiguous axial images were obtained from the base of the skull through the vertex without intravenous contrast. COMPARISON:  08/22/2019 FINDINGS: Brain: No evidence of acute infarction, hemorrhage, hydrocephalus, extra-axial collection. There is a dural-based calcification projecting inferiorly from the midline tentorium and measuring 13 mm, stable. Motion artifact towards the vertex and at the skull base. Vascular: No hyperdense vessel or unexpected calcification. Skull: Normal. Negative for fracture or focal lesion. Sinuses/Orbits: Negative IMPRESSION: 1. Motion degraded head CT without acute finding. 2. 13 mm dural based calcification along the tentorium, suspect meningioma. Electronically Signed   By: Monte Fantasia M.D.   On: 11/21/2019 05:18   IR US Guide Bx Asp/Drain  Result Date: 11/25/2019 CLINICAL DATA:  Autoimmune hepatitis, hepatic encephalopathy and elevated liver function tests. The patient requires random liver biopsy. EXAM: ULTRASOUND GUIDED CORE  BIOPSY OF LIVER MEDICATIONS: 1.0 mg IV Versed; 100 mcg IV Fentanyl Total Moderate Sedation Time: 13 minutes. The patient's level of consciousness and physiologic status were continuously monitored during the procedure by Radiology nursing. PROCEDURE: The procedure, risks, benefits, and alternatives were explained to the patient. Questions regarding the procedure were encouraged and answered. The patient understands and consents to the procedure. A time out was performed prior to initiating the procedure. The abdominal wall was prepped with chlorhexidine in a sterile fashion, and a sterile drape was applied covering the operative field. A sterile gown and sterile gloves were used for the procedure. Local anesthesia was provided with 1% Lidocaine. Under ultrasound guidance, a 17 gauge trocar needle was advanced into the right lobe of the liver. After confirming needle tip position, 2 separate coaxial 18 gauge core biopsy samples were obtained and  submitted in formalin. After the procedure a slurry of Gel-Foam pledgets was injected through the outer needle as the needle was retracted. Additional ultrasound was performed. COMPLICATIONS: None. FINDINGS: Solid core biopsy samples were obtained from the liver. IMPRESSION: Ultrasound-guided core biopsy performed of right lobe liver parenchyma. Electronically Signed   By: Aletta Edouard M.D.   On: 11/25/2019 15:21   VAS [place] LOWER EXTREMITY VENOUS (DVT)  Result Date: 10/28/2019  Lower Venous Study Indications: Patient with bilateral leg pain and swelling for many weeks.  Risk Factors: Patient reports history of DVT and PE, no records available in EPIC. Also reports she has an IVC filter placed. Surgery: Right ankle fracture repair 08/27/2019. Anticoagulation: Eliquis from 08/27/19 to 09/26/19 for DVT prophlyaxis s/p surgery. Performing Technologist: Mariane Masters RVT  Examination Guidelines: A complete evaluation includes B-mode imaging, spectral Doppler, color Doppler, and power Doppler as needed of all accessible portions of each vessel. Bilateral testing is considered an integral part of a complete examination. Limited examinations for reoccurring indications may be performed as noted.  +---------+---------------+---------+-----------+----------+--------------+ RIGHT    CompressibilityPhasicitySpontaneityPropertiesThrombus Aging +---------+---------------+---------+-----------+----------+--------------+ CFV      Full           Yes      Yes                                 +---------+---------------+---------+-----------+----------+--------------+ SFJ      Full           Yes      Yes                                 +---------+---------------+---------+-----------+----------+--------------+ FV Prox  Full           Yes      Yes                                 +---------+---------------+---------+-----------+----------+--------------+ FV Mid   Full           Yes      Yes                                  +---------+---------------+---------+-----------+----------+--------------+ FV DistalFull           Yes      Yes                                 +---------+---------------+---------+-----------+----------+--------------+  PFV      Full                                                        +---------+---------------+---------+-----------+----------+--------------+ POP      Full           Yes      Yes                                 +---------+---------------+---------+-----------+----------+--------------+ PTV      Full           Yes      Yes                                 +---------+---------------+---------+-----------+----------+--------------+ PERO     Full           Yes      Yes                                 +---------+---------------+---------+-----------+----------+--------------+ Gastroc  Full                                                        +---------+---------------+---------+-----------+----------+--------------+ GSV      Full           Yes      Yes                                 +---------+---------------+---------+-----------+----------+--------------+   +---------+---------------+---------+-----------+----------+--------------+ LEFT     CompressibilityPhasicitySpontaneityPropertiesThrombus Aging +---------+---------------+---------+-----------+----------+--------------+ CFV      Full           Yes      Yes                                 +---------+---------------+---------+-----------+----------+--------------+ SFJ      Full           Yes      Yes                                 +---------+---------------+---------+-----------+----------+--------------+ FV Prox  Full           Yes      Yes                                 +---------+---------------+---------+-----------+----------+--------------+ FV Mid   Full           Yes      Yes                                  +---------+---------------+---------+-----------+----------+--------------+ FV DistalFull           Yes  Yes                                 +---------+---------------+---------+-----------+----------+--------------+ PFV      Full                                                        +---------+---------------+---------+-----------+----------+--------------+ POP      Full           Yes      Yes                                 +---------+---------------+---------+-----------+----------+--------------+ PTV      Full           Yes      Yes                                 +---------+---------------+---------+-----------+----------+--------------+ PERO     Full           Yes      Yes                                 +---------+---------------+---------+-----------+----------+--------------+ Gastroc  Full                                                        +---------+---------------+---------+-----------+----------+--------------+ GSV      Full           Yes      Yes                                 +---------+---------------+---------+-----------+----------+--------------+    Findings reported to Rocco Pauls, PA-C at 3:45 pm.  Summary: Right: No evidence of deep vein thrombosis in the lower extremity. No indirect evidence of obstruction proximal to the inguinal ligament. No cystic structure found in the popliteal fossa. Left: No evidence of deep vein thrombosis in the lower extremity. No indirect evidence of obstruction proximal to the inguinal ligament. No cystic structure found in the popliteal fossa.  *See table(s) above for measurements and observations. Electronically signed by Kathlyn Sacramento MD on 10/28/2019 at 4:14:00 PM.    Final     Microbiology: Recent Results (from the past 240 hour(s))  SARS CORONAVIRUS 2 (TAT 6-24 HRS) Nasopharyngeal Nasopharyngeal Swab     Status: None   Collection Time: 11/21/19  3:11 AM   Specimen: Nasopharyngeal Swab   Result Value Ref Range Status   SARS Coronavirus 2 NEGATIVE NEGATIVE Final    Comment: (NOTE) SARS-CoV-2 target nucleic acids are NOT DETECTED. The SARS-CoV-2 RNA is generally detectable in upper and lower respiratory specimens during the acute phase of infection. Negative results do not preclude SARS-CoV-2 infection, do not rule out co-infections with other pathogens, and should not be used as the sole basis for treatment or other patient management decisions. Negative results must be combined  with clinical observations, patient history, and epidemiological information. The expected result is Negative. Fact Sheet for Patients: SugarRoll.be Fact Sheet for Healthcare Providers: https://www.woods-mathews.com/ This test is not yet approved or cleared by the [place] FDA and  has been authorized for detection and/or diagnosis of SARS-CoV-2 by FDA under an Emergency Use Authorization (EUA). This EUA will remain  in effect (meaning this test can be used) for the duration of the COVID-19 declaration under Section 56 4(b)(1) of the Act, 21 U.S.C. section 360bbb-3(b)(1), unless the authorization is terminated or revoked sooner. Performed at [facility], [place] [address] [place], [place] [address]      Labs: Basic Metabolic Panel: Recent Labs  Lab 11/22/19 0424 11/23/19 0412 11/24/19 0522 11/25/19 0421 11/26/19 0653  NA 135 138 137 139 137  K 4.4 4.1 4.1 3.6 3.8  CL 104 105 104 104 106  CO2 29 25 27 24 24   GLUCOSE 202* 151* 157* 172* 157*  BUN 24* 20 18 19 18   CREATININE 0.85 0.59 0.57 0.63 0.63  CALCIUM 8.5* 8.7* 8.8* 8.7* 8.6*   Liver Function Tests: Recent Labs  Lab 11/22/19 0424 11/23/19 0412 11/24/19 0522 11/25/19 0421 11/26/19 0653  AST 113* 113* 106* 117* 105*  ALT 62* 62* 59* 65* 61*  ALKPHOS 206* 198* 198* 222* 212*  BILITOT 4.5* 5.2* 4.8* 4.2* 3.0*  PROT 7.0 7.4 7.1 8.1 7.5  ALBUMIN 2.4* 2.5* 2.4* 2.7*  2.4*   Recent Labs  Lab 11/21/19 0015  LIPASE 38   Recent Labs  Lab 11/21/19 0231 11/22/19 0424 11/23/19 0803 11/24/19 1124 11/25/19 0823  AMMONIA 50* 118* 67* 98* 52*   CBC: Recent Labs  Lab 11/22/19 0424 11/23/19 0412 11/24/19 0522 11/25/19 0421 11/26/19 0443  WBC 5.9 6.6 7.4 6.8 6.2  NEUTROABS  --  2.4 2.4 2.3 2.3  HGB 9.5* 10.1* 10.0* 11.1* 10.4*  HCT 28.9* 30.5* 29.4* 33.1* 31.1*  MCV 98.0 98.1 94.8 95.4 97.2  PLT 376 379 421* 468* 448*   Cardiac Enzymes: No results for input(s): CKTOTAL, CKMB, CKMBINDEX, TROPONINI in the last 168 hours. BNP: BNP (last 3 results) No results for input(s): BNP in the last 8760 hours.  ProBNP (last 3 results) No results for input(s): PROBNP in the last 8760 hours.  CBG: Recent Labs  Lab 11/25/19 0756 11/25/19 1202 11/25/19 1705 11/25/19 2026 11/26/19 0757  GLUCAP 146* 139* 191* 141* 132*       Signed:  Nita Sells MD   [facility] 11/26/2019, 9:53 AM

## 2019-11-27 LAB — SURGICAL PATHOLOGY

## 2019-12-25 ENCOUNTER — Encounter: Payer: Self-pay | Admitting: Neurology

## 2020-02-13 ENCOUNTER — Telehealth: Payer: Self-pay | Admitting: Hematology & Oncology

## 2020-02-13 NOTE — Telephone Encounter (Signed)
lmom to inform patient of referral new patient appointment on 5/18 at 1030 am

## 2020-02-17 ENCOUNTER — Other Ambulatory Visit: Payer: Self-pay

## 2020-02-17 ENCOUNTER — Encounter: Payer: Self-pay | Admitting: Neurology

## 2020-02-17 ENCOUNTER — Ambulatory Visit (INDEPENDENT_AMBULATORY_CARE_PROVIDER_SITE_OTHER): Admitting: Neurology

## 2020-02-17 VITALS — BP 131/81 | HR 72 | Resp 18 | Ht 66.0 in | Wt 262.0 lb

## 2020-02-17 DIAGNOSIS — K754 Autoimmune hepatitis: Secondary | ICD-10-CM

## 2020-02-17 DIAGNOSIS — R410 Disorientation, unspecified: Secondary | ICD-10-CM | POA: Diagnosis not present

## 2020-02-17 DIAGNOSIS — R55 Syncope and collapse: Secondary | ICD-10-CM

## 2020-02-17 DIAGNOSIS — D320 Benign neoplasm of cerebral meninges: Secondary | ICD-10-CM | POA: Diagnosis not present

## 2020-02-17 DIAGNOSIS — R413 Other amnesia: Secondary | ICD-10-CM | POA: Diagnosis not present

## 2020-02-17 MED ORDER — DIAZEPAM 5 MG PO TABS: ORAL_TABLET | ORAL | 0 refills | Status: DC

## 2020-02-17 NOTE — Progress Notes (Signed)
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  History supplemented by hospital and referring provider notes.  She was diagnosed with autoimmune hepatitis in January and was started on azathioprine.  She was admitted to [facility] on 11/21/2019 for 2 week duration of increased confusion, forgetfulness, lethargy and increased tremors.  She was noted to be jaundiced.  CT head in ED personally reviewed showed 13 mm meningioma along the mildline tentorium.  Ammonia level was 50, which subsequently rose to 98, elevated liver enzymes AST 146, ALT 74 and t bili of 5.8.  Electrolytes normal.  Glucose 137.  Kidney function okay.  HIV negative.  CBC showed anemia (HGB 10.3/HCT31.1/MCV 97.5) but normal WBC 6.5.  UA positive for nitrite but negative for leukocytes and rare bacteria.  COVID testing was negative.  Lactulose was increased.  Liver biopsy was performed which later showed stage 3/4 fibrosis but no cirrhosis. She recollection of most of her hospital stay but still has trouble with some recall from that time.  She also feels more irritable and emotional.  She says she has had short-term memory problems since 18 hour surgery under anesthesia for disseminated adeno mucinosis appendix state IV in 2005.  For over 20 years, she has had recurrent syncope.  It occurs 3 or 4 times a year.  It has previously been witnessed.  No bowel/bladder incontinence or tongue biting.  No postictal confusion.  No Cardiac workup has been negative.    January 2021  LABS:  B12 734, folate 15.53, TSH 1.748.  PAST MEDICAL HISTORY: Past Medical History:  Diagnosis Date  . Arthritis   . Back pain   . Cancer [facility])    pseudomyxoma peritonei  . Chronic fatigue syndrome   . Diabetes mellitus without complication ([place])   . Fibromyalgia   . Hypertension     PAST SURGICAL HISTORY: Past Surgical History:  Procedure Laterality Date  . ABDOMINAL HYSTERECTOMY    . ABDOMINAL SURGERY    . ACHILLES TENDON REPAIR    . APPENDECTOMY    . ARTHROPLASTY    . CARPAL TUNNEL RELEASE    . CHOLECYSTECTOMY    . IR US GUIDE BX ASP/DRAIN  11/25/2019  . JOINT REPLACEMENT    . KNEE ARTHROSCOPY    . ORIF ANKLE FRACTURE Right 08/27/2019   Procedure: OPEN REDUCTION INTERNAL FIXATION RIGHT ANKLE FRACTURE;  Surgeon: Renette Butters, MD;  Location: [facility] ORS;  Service: Orthopedics;  Laterality: Right;  . perineorrophy    . TONGUE BIOPSY      MEDICATIONS: Current Outpatient Medications on File Prior to Visit  Medication Sig Dispense Refill  . amLODIPine-Valsartan-HCTZ 5-160-12.5 MG TABS Take 1 tablet by mouth every morning.     . busPIRone (BUSPAR) 5 MG tablet Take 5 mg by mouth 3 (three) times daily as needed (anxiety).     . Dulaglutide (TRULICITY) A999333 0000000 SOPN Inject 0.75 mg into the skin once a week. Tuesday    . DULoxetine (CYMBALTA) 20 MG capsule Take 3 capsules (60 mg total) by mouth 2 (two) times daily. 30 capsule 0  . ferrous sulfate  325 (65 FE) MG EC tablet Take 325 mg by mouth 3 (three) times a week.     . insulin glargine (LANTUS) 100 UNIT/ML injection Inject 0.1 mLs (10 Units total) into the skin at bedtime. Before bed 10 mL 11  . insulin lispro (HUMALOG) 100 UNIT/ML injection Inject 0.08 mLs (8 Units total) into the skin 3 (three) times daily before meals. 10 mL 11  . lactulose (CHRONULAC) 10 GM/15ML solution Take 45 mLs (30 g total) by mouth 3 (three) times daily. 236 mL 0  . metFORMIN (GLUCOPHAGE) 500 MG tablet Take 1,000 mg by mouth 2 (two) times daily  with a meal.     . nitrofurantoin (MACRODANTIN) 50 MG capsule Take 50 mg by mouth at bedtime.     Marland Kitchen omeprazole (PRILOSEC) 40 MG capsule Take 40 mg by mouth at bedtime.    . ondansetron (ZOFRAN) 4 MG tablet Take 1 tablet (4 mg total) by mouth every 8 (eight) hours as needed for nausea or vomiting. (Patient taking differently: Take 4 mg by mouth daily. ) 20 tablet 0  . pregabalin (LYRICA) 75 MG capsule Take 1 capsule daily until you see your primary 30 capsule 0  . propranolol (INDERAL) 20 MG tablet Take 20 mg by mouth 2 (two) times daily.      No current facility-administered medications on file prior to visit.    ALLERGIES: Allergies  Allergen Reactions  . Codeine Shortness Of Breath and Palpitations  . Penicillins Shortness Of Breath    Other reaction(s): Irregular Heart Rate, Other (See Comments) Rapid heartrate   . Alprazolam Hives and Other (See Comments)    Note: tolerates midazolam fine unresponsive Hard to arouse unresponsive Hard to arouse  Other reaction(s): Other (See Comments) unresponsive  . Ativan [Lorazepam] Other (See Comments)    Note: tolerates midazolam fine Face & Throat Swelling.  . Corticosteroids Other (See Comments)    Other reaction(s): Other (see comments) Psychotic behaivor Other reaction(s): Other (See Comments) Psychotic behaivor Psychotic behaivor Psychotic behaivor   . Erythromycin     Other reaction(s): Other (See Comments) Severe stomach pain   . Erythromycin Base Hives  . Milnacipran Hcl Other (See Comments)    Other reaction(s): Other (see comments) psychotic episode psychotic episode  Other reaction(s): Other (See Comments) psychotic episode  . Prednisone Other (See Comments)    Anxiety & Nervous Breakdown.  Ocie Cornfield  [Milnacipran]   . Povidone Iodine Rash    Had rash under bandage after surgery , has had betadine since without reaction   . Prednisolone Anxiety    FAMILY HISTORY: History reviewed. No pertinent family  history.  SOCIAL HISTORY: Social History   Socioeconomic History  . Marital status: Married    Spouse name: Not on file  . Number of children: Not on file  . Years of education: Not on file  . Highest education level: Not on file  Occupational History  . Not on file  Tobacco Use  . Smoking status: Never Smoker  . Smokeless tobacco: Never Used  Substance and Sexual Activity  . Alcohol use: No  . Drug use: Never  . Sexual activity: Not on file  Other Topics Concern  . Not on file  Social History Narrative  . Not on file   Social Determinants of Health   Financial Resource Strain:   . Difficulty of Paying Living Expenses:   Food Insecurity:   . Worried About Charity fundraiser in the Last Year:   . Ran  Out of Food in the Last Year:   Transportation Needs:   . Lack of Transportation (Medical):   Marland Kitchen Lack of Transportation (Non-Medical):   Physical Activity:   . Days of Exercise per Week:   . Minutes of Exercise per Session:   Stress:   . Feeling of Stress :   Social Connections:   . Frequency of Communication with Friends and Family:   . Frequency of Social Gatherings with Friends and Family:   . Attends Religious Services:   . Active Member of Clubs or Organizations:   . Attends Archivist Meetings:   Marland Kitchen Marital Status:   Intimate Partner Violence:   . Fear of Current or Ex-Partner:   . Emotionally Abused:   Marland Kitchen Physically Abused:   . Sexually Abused:     PHYSICAL EXAM: Blood pressure 131/81, pulse 72, resp. rate 18, height 5\' 6"  (1.676 m), weight 262 lb (118.8 kg), SpO2 99 %. General: No acute distress.  Patient appears well-groomed.  Head:  Normocephalic/atraumatic Eyes:  fundi examined but not visualized Neck: supple, no paraspinal tenderness, full range of motion Back: No paraspinal tenderness Heart: regular rate and rhythm Lungs: Clear to auscultation bilaterally. Vascular: No carotid bruits. Neurological Exam: Mental status: alert and oriented  to person, place, and time, recent and remote memory intact, fund of knowledge intact, attention and concentration intact, speech fluent and not dysarthric, language intact. Cranial nerves: CN I: not tested CN II: pupils equal, round and reactive to light, visual fields intact CN III, IV, VI:  full range of motion, no nystagmus, no ptosis CN V: facial sensation intact CN VII: upper and lower face symmetric CN VIII: hearing intact CN IX, X: gag intact, uvula midline CN XI: sternocleidomastoid and trapezius muscles intact CN XII: tongue midline Bulk & Tone: normal, no fasciculations. Motor:  5/5 throughout  Sensation:  Pinprick and vibration sensation intact. Deep Tendon Reflexes:  2+ throughout, toes downgoing.  Finger to nose testing:  Without dysmetria.  Heel to shin:  Without dysmetria.  Gait:  Normal station and stride.  Able to turn and tandem walk. Romberg negative.  IMPRESSION: 1.  Encephalopathy/altered mental status.  Questionable hepatic encephalopathy secondary to autoimmune hepatitis, however biopsy negative for cirrhosis.   2.  Cerebral meningioma 3.  Recurrent syncope 4.  Memory deficits  Given the episode of altered mental status, syncope and cerebral meningioma, consider seizure.  Endorses memory deficits which may be related to anxiety or residual from prolonged anesthesia.  PLAN: 1.  MRI of brain with and without contrast.  Valium prescribed (will need driver). 2.  Sleep-deprived EEG 3.  Neurocognitive evaluation 4.  Follow up after testing.  Thank you for allowing me to take part in the care of this patient.  Metta Clines, DO  CC:  Glendon Axe, MD  Koleen Distance, MD

## 2020-02-17 NOTE — Patient Instructions (Addendum)
1.  MRI of brain with and without contrast 2.  Sleep-deprived EEG 3.  Neurocognitive testing 4.  Follow up after testing.

## 2020-02-24 ENCOUNTER — Telehealth: Payer: Self-pay | Admitting: Neurology

## 2020-02-24 NOTE — Telephone Encounter (Signed)
Patient states she has a sleep deprived EEG on 02/26/20 that she is very nervous about. States that she gets sick when she doesn't get enough sleep. She would like to know if she can have a regular EEG. Please call.

## 2020-02-24 NOTE — Telephone Encounter (Signed)
Can this be done here?

## 2020-02-25 ENCOUNTER — Other Ambulatory Visit: Payer: Self-pay

## 2020-02-25 DIAGNOSIS — R55 Syncope and collapse: Secondary | ICD-10-CM

## 2020-02-25 NOTE — Telephone Encounter (Signed)
Patient confirmed appt with Hinton Dyer and will be here at 76, I contacted susan to let her know.

## 2020-02-25 NOTE — Telephone Encounter (Signed)
She is going to do EEG and Hinton Dyer has scheduled her, but I did reach out to her and see if she can come in at 1

## 2020-02-25 NOTE — Telephone Encounter (Signed)
Patient called back regarding EEG for Wednesday, stated no one has called her back to talk to her about it.

## 2020-02-26 ENCOUNTER — Other Ambulatory Visit: Payer: Self-pay

## 2020-02-26 ENCOUNTER — Ambulatory Visit (INDEPENDENT_AMBULATORY_CARE_PROVIDER_SITE_OTHER): Admitting: Neurology

## 2020-02-26 DIAGNOSIS — R410 Disorientation, unspecified: Secondary | ICD-10-CM | POA: Diagnosis not present

## 2020-02-26 DIAGNOSIS — R55 Syncope and collapse: Secondary | ICD-10-CM

## 2020-02-26 DIAGNOSIS — R413 Other amnesia: Secondary | ICD-10-CM

## 2020-02-26 DIAGNOSIS — K754 Autoimmune hepatitis: Secondary | ICD-10-CM

## 2020-02-26 DIAGNOSIS — D320 Benign neoplasm of cerebral meninges: Secondary | ICD-10-CM

## 2020-02-27 NOTE — Procedures (Signed)
ONE HOUR SLEEP-DEPRIVED ELECTROENCEPHALOGRAM REPORT  Date of Study: 02/26/2020  Patient's Name: Wendy Barber MRN: [id number] Date of Birth: 1961/06/08  Clinical History: 59 year old female with autoimmune hepatitis and cerebral meningioma who presents for altered mental status, recurrent syncope and memory deficits.  Medications: amLODIPine-Valsartan-HCTZ 5-160-12.5 MG TABS BUSPAR 5 MG tablet TRULICITY A999333 0000000 SOPN CYMBALTA 20 MG capsule 65 FE MG EC tablet LANTUS 100 UNIT/ML injection HUMALOG 100 UNIT/ML injection CHRONULAC 10 GM/15ML solution GLUCOPHAGE 500 MG tablet MACRODANTIN 50 MG capsule PRILOSEC 40 MG capsule ZOFRAN 4 MG tablet LYRICA 75 MG capsule INDERAL 20 MG tablet  Technical Summary: A multichannel digital EEG recording measured by the international 10-20 system with electrodes applied with paste and impedances below 5000 ohms performed in our laboratory with EKG monitoring in an awake and drowsy patient.  Hyperventilation was not performed as patient wearing mask due to COVID-19.  Photic stimulation was performed.  The digital EEG was referentially recorded, reformatted, and digitally filtered in a variety of bipolar and referential montages for optimal display.    Description: The patient is awake and drowsy during the recording.  During maximal wakefulness, there is a symmetric, medium voltage 9 Hz posterior dominant rhythm that attenuates with eye opening.  The record is symmetric.  During drowsiness, there is an increase in theta slowing of the background.  Stage 2 sleep was not seen.  Photic stimulation did not elicit any abnormalities.  There were no epileptiform discharges or electrographic seizures seen.    EKG lead was unremarkable.  Impression: This one-hour sleep-deprived awake and drowsy EEG is normal.    Clinical Correlation: A normal EEG does not exclude a clinical diagnosis of epilepsy.  If further clinical questions remain, prolonged EEG may  be helpful.  Clinical correlation is advised.   Metta Clines, DO

## 2020-03-10 ENCOUNTER — Inpatient Hospital Stay

## 2020-03-10 ENCOUNTER — Inpatient Hospital Stay: Admitting: Hematology & Oncology

## 2020-03-19 ENCOUNTER — Encounter: Payer: Self-pay | Admitting: Hematology & Oncology

## 2020-03-19 ENCOUNTER — Other Ambulatory Visit: Payer: Self-pay | Admitting: Neurology

## 2020-03-19 ENCOUNTER — Other Ambulatory Visit: Payer: Self-pay

## 2020-03-19 ENCOUNTER — Inpatient Hospital Stay (HOSPITAL_BASED_OUTPATIENT_CLINIC_OR_DEPARTMENT_OTHER): Payer: TRICARE For Life (TFL) | Admitting: Hematology & Oncology

## 2020-03-19 ENCOUNTER — Inpatient Hospital Stay: Payer: TRICARE For Life (TFL) | Attending: Hematology & Oncology

## 2020-03-19 VITALS — Ht 66.0 in | Wt 256.4 lb

## 2020-03-19 DIAGNOSIS — Z836 Family history of other diseases of the respiratory system: Secondary | ICD-10-CM | POA: Insufficient documentation

## 2020-03-19 DIAGNOSIS — M549 Dorsalgia, unspecified: Secondary | ICD-10-CM | POA: Insufficient documentation

## 2020-03-19 DIAGNOSIS — K754 Autoimmune hepatitis: Secondary | ICD-10-CM | POA: Insufficient documentation

## 2020-03-19 DIAGNOSIS — Z880 Allergy status to penicillin: Secondary | ICD-10-CM | POA: Diagnosis not present

## 2020-03-19 DIAGNOSIS — Z79899 Other long term (current) drug therapy: Secondary | ICD-10-CM | POA: Diagnosis not present

## 2020-03-19 DIAGNOSIS — Z8543 Personal history of malignant neoplasm of ovary: Secondary | ICD-10-CM | POA: Insufficient documentation

## 2020-03-19 DIAGNOSIS — Z86718 Personal history of other venous thrombosis and embolism: Secondary | ICD-10-CM | POA: Diagnosis not present

## 2020-03-19 DIAGNOSIS — K7682 Hepatic encephalopathy: Secondary | ICD-10-CM

## 2020-03-19 DIAGNOSIS — Z85038 Personal history of other malignant neoplasm of large intestine: Secondary | ICD-10-CM

## 2020-03-19 DIAGNOSIS — M7989 Other specified soft tissue disorders: Secondary | ICD-10-CM | POA: Insufficient documentation

## 2020-03-19 DIAGNOSIS — M2550 Pain in unspecified joint: Secondary | ICD-10-CM | POA: Diagnosis not present

## 2020-03-19 DIAGNOSIS — Z96653 Presence of artificial knee joint, bilateral: Secondary | ICD-10-CM

## 2020-03-19 DIAGNOSIS — Z7901 Long term (current) use of anticoagulants: Secondary | ICD-10-CM

## 2020-03-19 DIAGNOSIS — I10 Essential (primary) hypertension: Secondary | ICD-10-CM

## 2020-03-19 DIAGNOSIS — R531 Weakness: Secondary | ICD-10-CM | POA: Insufficient documentation

## 2020-03-19 DIAGNOSIS — Z96611 Presence of right artificial shoulder joint: Secondary | ICD-10-CM | POA: Insufficient documentation

## 2020-03-19 DIAGNOSIS — R12 Heartburn: Secondary | ICD-10-CM | POA: Insufficient documentation

## 2020-03-19 DIAGNOSIS — E669 Obesity, unspecified: Secondary | ICD-10-CM

## 2020-03-19 DIAGNOSIS — R600 Localized edema: Secondary | ICD-10-CM | POA: Insufficient documentation

## 2020-03-19 DIAGNOSIS — Z86711 Personal history of pulmonary embolism: Secondary | ICD-10-CM | POA: Insufficient documentation

## 2020-03-19 DIAGNOSIS — K7290 Hepatic failure, unspecified without coma: Secondary | ICD-10-CM | POA: Insufficient documentation

## 2020-03-19 DIAGNOSIS — Z96612 Presence of left artificial shoulder joint: Secondary | ICD-10-CM | POA: Diagnosis not present

## 2020-03-19 DIAGNOSIS — C786 Secondary malignant neoplasm of retroperitoneum and peritoneum: Secondary | ICD-10-CM

## 2020-03-19 DIAGNOSIS — Z794 Long term (current) use of insulin: Secondary | ICD-10-CM | POA: Diagnosis not present

## 2020-03-19 DIAGNOSIS — E119 Type 2 diabetes mellitus without complications: Secondary | ICD-10-CM

## 2020-03-19 DIAGNOSIS — C772 Secondary and unspecified malignant neoplasm of intra-abdominal lymph nodes: Secondary | ICD-10-CM | POA: Insufficient documentation

## 2020-03-19 DIAGNOSIS — Z885 Allergy status to narcotic agent status: Secondary | ICD-10-CM | POA: Diagnosis not present

## 2020-03-19 DIAGNOSIS — Z7189 Other specified counseling: Secondary | ICD-10-CM

## 2020-03-19 DIAGNOSIS — C181 Malignant neoplasm of appendix: Secondary | ICD-10-CM

## 2020-03-19 DIAGNOSIS — R110 Nausea: Secondary | ICD-10-CM | POA: Diagnosis not present

## 2020-03-19 DIAGNOSIS — I82412 Acute embolism and thrombosis of left femoral vein: Secondary | ICD-10-CM

## 2020-03-19 DIAGNOSIS — I2699 Other pulmonary embolism without acute cor pulmonale: Secondary | ICD-10-CM

## 2020-03-19 DIAGNOSIS — Z95828 Presence of other vascular implants and grafts: Secondary | ICD-10-CM

## 2020-03-19 HISTORY — DX: Other pulmonary embolism without acute cor pulmonale: I26.99

## 2020-03-19 HISTORY — DX: Malignant neoplasm of appendix: C77.2

## 2020-03-19 HISTORY — DX: Other specified counseling: Z71.89

## 2020-03-19 HISTORY — DX: Secondary and unspecified malignant neoplasm of intra-abdominal lymph nodes: C18.1

## 2020-03-19 HISTORY — DX: Presence of other vascular implants and grafts: Z95.828

## 2020-03-19 HISTORY — DX: Secondary malignant neoplasm of retroperitoneum and peritoneum: C78.6

## 2020-03-19 HISTORY — DX: Acute embolism and thrombosis of left femoral vein: I82.412

## 2020-03-19 LAB — CBC WITH DIFFERENTIAL (CANCER CENTER ONLY)
Abs Immature Granulocytes: 0.06 10*3/uL (ref 0.00–0.07)
Basophils Absolute: 0.1 10*3/uL (ref 0.0–0.1)
Basophils Relative: 1 %
Eosinophils Absolute: 0.4 10*3/uL (ref 0.0–0.5)
Eosinophils Relative: 6 %
HCT: 36.3 % (ref 36.0–46.0)
Hemoglobin: 12.4 g/dL (ref 12.0–15.0)
Immature Granulocytes: 1 %
Lymphocytes Relative: 39 %
Lymphs Abs: 2.6 10*3/uL (ref 0.7–4.0)
MCH: 36.5 pg — ABNORMAL HIGH (ref 26.0–34.0)
MCHC: 34.2 g/dL (ref 30.0–36.0)
MCV: 106.8 fL — ABNORMAL HIGH (ref 80.0–100.0)
Monocytes Absolute: 0.6 10*3/uL (ref 0.1–1.0)
Monocytes Relative: 10 %
Neutro Abs: 2.8 10*3/uL (ref 1.7–7.7)
Neutrophils Relative %: 43 %
Platelet Count: 311 10*3/uL (ref 150–400)
RBC: 3.4 MIL/uL — ABNORMAL LOW (ref 3.87–5.11)
RDW: 17.1 % — ABNORMAL HIGH (ref 11.5–15.5)
WBC Count: 6.5 10*3/uL (ref 4.0–10.5)
nRBC: 1.4 % — ABNORMAL HIGH (ref 0.0–0.2)

## 2020-03-19 LAB — CMP (CANCER CENTER ONLY)
ALT: 20 U/L (ref 0–44)
AST: 44 U/L — ABNORMAL HIGH (ref 15–41)
Albumin: 3.7 g/dL (ref 3.5–5.0)
Alkaline Phosphatase: 129 U/L — ABNORMAL HIGH (ref 38–126)
Anion gap: 8 (ref 5–15)
BUN: 16 mg/dL (ref 6–20)
CO2: 30 mmol/L (ref 22–32)
Calcium: 10.2 mg/dL (ref 8.9–10.3)
Chloride: 105 mmol/L (ref 98–111)
Creatinine: 0.78 mg/dL (ref 0.44–1.00)
GFR, Est AFR Am: >60 mL/min (ref >60)
GFR, Estimated: >60 mL/min (ref >60)
Glucose, Bld: 133 mg/dL — ABNORMAL HIGH (ref 70–99)
Potassium: 3.6 mmol/L (ref 3.5–5.1)
Sodium: 143 mmol/L (ref 135–145)
Total Bilirubin: 0.7 mg/dL (ref 0.3–1.2)
Total Protein: 7.7 g/dL (ref 6.5–8.1)

## 2020-03-19 LAB — RETICULOCYTES
Immature Retic Fract: 7.6 % (ref 2.3–15.9)
RBC.: 0.68 MIL/uL — ABNORMAL LOW (ref 3.87–5.11)
Retic Count, Absolute: 46.4 10*3/uL (ref 19.0–186.0)
Retic Ct Pct: 6.8 % — ABNORMAL HIGH (ref 0.4–3.1)

## 2020-03-19 NOTE — Progress Notes (Signed)
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 New Patient (Initial Visit)    Hx of ovarian cancer and appendix cancer. 2004  : I moved here about a year ago and was told he needed to follow-up with a cancer specialist and blood doctor.  HPI: Wendy Barber is an incredibly charming 59 year old white female.  I must say that she has an extraordinary past medical history.  She is originally from [place].  She has lived in different areas as she is married to a Nature conservation officer man.  Her husband is a Clinical biochemist in [employer], academic.  She has having 3 children and 7 grandchildren.  She has a very complicated past medical history.  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  She was living in [place].  She actually saw a specialist who performed the HIPEC procedure.  This is truly extraordinary since this is probably one of the earlier patients had had this done.  The pathology report that she brought with her show that she had a low-grade mucinous adenocarcinoma.  She had normal tumor markers although the CA 19-9 was elevated but then decreased to normal after surgery.  She did have thromboembolic disease after the procedure.  She says she had a filter placed.  She really cannot remember how long she was on blood thinner.  In October 2019, while visiting a son up in [place], she developed a thrombus in the left leg.  She has had extended from the thigh down to the ankle.  She said that she was placed on Lovenox and then she thinks it was Xarelto.  She was going on for 3 months.  She does not recall being seen by a hematologist.  She does not recall being tested for a thrombophilic state.  She says that multiple family members have had blood clots.  She essentially  moved to the [place] area about a year or so ago.  She was seen by [facility] medical clinic.  They obviously have done a good job trying to deal with all of her health issues.  They did recommend that she see a hematologist and oncologist.  She was diagnosed back in January of this year with autoimmune hepatitis.  She apparently developed hepatic encephalopathy.  She has had multiple multiple surgeries.  She has had bilateral knee replacement.  She has had bilateral shoulder replacement.  She has had partial hysterectomy.  She had oophorectomy.  She does have some mild swelling of the left leg.  She says her mammograms are up-to-date.  She has never smoked.  She has never really drink alcohol.  There is really no family history of cancer although her father had mesothelioma.  He worked in a shipyard.  There has been no swollen lymph nodes.  She has had no obvious weight loss.  She has had no nausea or vomiting.  Overall, I would have to say that her performance status is probably ECOG 1.     Past Medical History:  Diagnosis Date  . Arthritis   . Back pain   . Cancer [facility])    pseudomyxoma peritonei  . Chronic fatigue syndrome   . Diabetes mellitus without complication ([place])   . Fibromyalgia   . Hypertension   :  Past Surgical History:  Procedure  Laterality Date  . ABDOMINAL HYSTERECTOMY    . ABDOMINAL SURGERY    . ACHILLES TENDON REPAIR    . APPENDECTOMY    . ARTHROPLASTY    . CARPAL TUNNEL RELEASE    . CHOLECYSTECTOMY    . IR US GUIDE BX ASP/DRAIN  11/25/2019  . JOINT REPLACEMENT    . KNEE ARTHROSCOPY    . ORIF ANKLE FRACTURE Right 08/27/2019   Procedure: OPEN REDUCTION INTERNAL FIXATION RIGHT ANKLE FRACTURE;  Surgeon: Renette Butters, MD;  Location: [facility] ORS;  Service: Orthopedics;  Laterality: Right;  . perineorrophy    . TONGUE BIOPSY    :   Current Outpatient Medications:  .  amLODipine (NORVASC) 5 MG tablet, Take 5 mg by mouth daily., Disp: , Rfl:  .   amLODIPine-Valsartan-HCTZ 5-160-12.5 MG TABS, Take 1 tablet by mouth every morning. , Disp: , Rfl:  .  AZASAN 75 MG TABS, Take by mouth in the morning and at bedtime. , Disp: , Rfl:  .  busPIRone (BUSPAR) 5 MG tablet, Take 5 mg by mouth 3 (three) times daily as needed (anxiety). , Disp: , Rfl:  .  Dulaglutide (TRULICITY) [id number] SOPN, Inject 0.75 mg into the skin once a week. Tuesday, Disp: , Rfl:  .  ferrous sulfate 325 (65 FE) MG EC tablet, Take 325 mg by mouth 3 (three) times a week. , Disp: , Rfl:  .  FREESTYLE LITE test strip, , Disp: , Rfl:  .  furosemide (LASIX) 20 MG tablet, Take 20 mg by mouth daily as needed. , Disp: , Rfl:  .  insulin glargine (LANTUS) 100 UNIT/ML injection, Inject 0.1 mLs (10 Units total) into the skin at bedtime. Before bed (Patient taking differently: Inject 14 Units into the skin at bedtime. Before bed ), Disp: 10 mL, Rfl: 11 .  insulin lispro (HUMALOG) 100 UNIT/ML injection, Inject 0.08 mLs (8 Units total) into the skin 3 (three) times daily before meals. (Patient taking differently: Inject 12 Units into the skin 3 (three) times daily before meals. ), Disp: 10 mL, Rfl: 11 .  lactulose (CHRONULAC) 10 GM/15ML solution, Take 45 mLs (30 g total) by mouth 3 (three) times daily., Disp: 236 mL, Rfl: 0 .  metFORMIN (GLUCOPHAGE) 500 MG tablet, Take 500 mg by mouth 2 (two) times daily with a meal. , Disp: , Rfl:  .  omeprazole (PRILOSEC) 40 MG capsule, Take 40 mg by mouth at bedtime., Disp: , Rfl:  .  ondansetron (ZOFRAN) 8 MG tablet, Take 8 mg by mouth 3 (three) times daily., Disp: , Rfl:  .  oxyCODONE (OXY IR/ROXICODONE) 5 MG immediate release tablet, Take 5 mg by mouth 2 (two) times daily as needed., Disp: , Rfl:  .  propranolol (INDERAL) 20 MG tablet, Take 20 mg by mouth 2 (two) times daily. , Disp: , Rfl:  .  sucralfate (CARAFATE) 1 g tablet, Take 1 g by mouth 2 (two) times daily. , Disp: , Rfl:  .  diazepam (VALIUM) 5 MG tablet, Take 40 minutes prior to MRI (Patient  not taking: Reported on 03/19/2020), Disp: 1 tablet, Rfl: 0 .  DULoxetine (CYMBALTA) 20 MG capsule, Take 3 capsules (60 mg total) by mouth 2 (two) times daily. (Patient not taking: Reported on 03/19/2020), Disp: 30 capsule, Rfl: 0:  :  Allergies  Allergen Reactions  . Codeine Shortness Of Breath and Palpitations  . Penicillins Shortness Of Breath    Other reaction(s): Irregular Heart Rate, Other (See Comments) Rapid heartrate   .  Alprazolam Hives and Other (See Comments)    Note: tolerates midazolam fine unresponsive Hard to arouse unresponsive Hard to arouse  Other reaction(s): Other (See Comments) unresponsive  . Ativan [Lorazepam] Other (See Comments)    Note: tolerates midazolam fine Face & Throat Swelling.  . Corticosteroids Other (See Comments)    Other reaction(s): Other (see comments) Psychotic behaivor Other reaction(s): Other (See Comments) Psychotic behaivor Psychotic behaivor Psychotic behaivor   . Erythromycin     Other reaction(s): Other (See Comments) Severe stomach pain   . Erythromycin Base Hives  . Milnacipran Hcl Other (See Comments)    Other reaction(s): Other (see comments) psychotic episode psychotic episode  Other reaction(s): Other (See Comments) psychotic episode  . Prednisone Other (See Comments)    Anxiety & Nervous Breakdown.  Ocie Cornfield  [Milnacipran]   . Povidone Iodine Rash    Had rash under bandage after surgery , has had betadine since without reaction   . Prednisolone Anxiety  :  History reviewed. No pertinent family history.:  Social History   Socioeconomic History  . Marital status: Married    Spouse name: Not on file  . Number of children: 3  . Years of education: Not on file  . Highest education level: Some college, no degree  Occupational History  . Not on file  Tobacco Use  . Smoking status: Never Smoker  . Smokeless tobacco: Never Used  Substance and Sexual Activity  . Alcohol use: No  . Drug use: Never  .  Sexual activity: Not on file  Other Topics Concern  . Not on file  Social History Narrative   Right handed   One story home   Drinks occasional caffeine   Social Determinants of Health   Financial Resource Strain:   . Difficulty of Paying Living Expenses:   Food Insecurity:   . Worried About Charity fundraiser in the Last Year:   . Arboriculturist in the Last Year:   Transportation Needs:   . Film/video editor (Medical):   Marland Kitchen Lack of Transportation (Non-Medical):   Physical Activity:   . Days of Exercise per Week:   . Minutes of Exercise per Session:   Stress:   . Feeling of Stress :   Social Connections:   . Frequency of Communication with Friends and Family:   . Frequency of Social Gatherings with Friends and Family:   . Attends Religious Services:   . Active Member of Clubs or Organizations:   . Attends Archivist Meetings:   Marland Kitchen Marital Status:   Intimate Partner Violence:   . Fear of Current or Ex-Partner:   . Emotionally Abused:   Marland Kitchen Physically Abused:   . Sexually Abused:   :  Review of Systems  Constitutional: Negative.   HENT: Negative.   Eyes: Negative.   Respiratory: Negative.   Cardiovascular: Negative.   Gastrointestinal: Positive for heartburn and nausea.  Genitourinary: Negative.   Musculoskeletal: Positive for back pain and joint pain.  Skin: Negative.   Neurological: Positive for focal weakness.  Endo/Heme/Allergies: Negative.   Psychiatric/Behavioral: Negative.      Exam: This is an obese white female in no obvious distress.  Vital signs show a temperature of 97.8.  Pulse 60.  Blood pressure 135/91.  Weight is 256 pounds.  Head neck exam shows no ocular or oral lesions.  She has no scleral icterus.  There is no palpable thyroid.  She has no adenopathy in the neck.  Lungs  are clear bilaterally.  He has no crackles or rhonchi.  Cardiac exam regular rate and rhythm with a normal S1 and S2.  There are no murmurs rubs or bruits.  Abdomen  is obese but soft.  She has well-healed laparotomy scar in the midline.  There is no guarding or rebound tenderness.  There is no fluid wave.  There is no palpable hepatomegaly.  She has had her spleen taken out.  Back exam shows no tenderness over the spine, ribs or hips.  Extremities shows some minimal nonpitting edema of the left leg.  I cannot palpate a venous cord.  There is a negative Homans' sign.  Skin exam shows no rashes, ecchymoses or petechia.  Neurological exam shows no focal neurological deficits.  @IPVITALS @   Recent Labs    03/19/20 1109  WBC 6.5  HGB 12.4  HCT 36.3  PLT 311   Recent Labs    03/19/20 1109  NA 143  K 3.6  CL 105  CO2 30  GLUCOSE 133*  BUN 16  CREATININE 0.78  CALCIUM 10.2    Blood smear review: None  Pathology: See above    Assessment and Plan: Wendy Barber is a very nice 59 year old white female.  She has a remarkable history.  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  She had the hyperthermic intraperitoneal therapy for this.  This definitely has worked.  I would be shocked if her cancer would come back after 16 years.  I am more interested in the fact that she has had these recurrent thrombotic episodes.  I can understand having the thrombotic episode back in 2005 after her incredible surgery and malignancy as her kind of mucinous adenocarcinoma clearly causes activation of factor X and this lead to coagulable issues.  She has the IVC filter in.  I suppose this could be considered a risk factor for having the thrombus in the left leg.  A problem that we have is that she is planning on going to [place].  She has a son who lives in [place] and works in [place].  I would be very worried for her if she flew over to [facility] and was not on any kind of anticoagulation.  We definitely need to follow-up with a CT scan of the body to assess for any type of recurrence of this  appendiceal adenocarcinoma.  She has been getting this yearly.  I do want to get a Doppler of her legs.  This I think will be very important for [place].  I spent over an hour with her.  Again she has a very complicated history.  There is a lot to go through.  She is incredibly intelligent and is incredibly helpful with her history.  I will plan to see her back before she heads off to [place] and figure out what kind of anticoagulation protocol we should get her on.  She definitely needs hypercoagulable studies to be done.

## 2020-03-20 LAB — IRON AND TIBC
Iron: 84 ug/dL (ref 41–142)
Saturation Ratios: 20 % — ABNORMAL LOW (ref 21–57)
TIBC: 411 ug/dL (ref 236–444)
UIBC: 327 ug/dL (ref 120–384)

## 2020-03-20 LAB — ERYTHROPOIETIN: Erythropoietin: 24.4 m[IU]/mL — ABNORMAL HIGH (ref 2.6–18.5)

## 2020-03-20 LAB — FERRITIN: Ferritin: 38 ng/mL (ref 11–307)

## 2020-03-20 LAB — LACTATE DEHYDROGENASE: LDH: 284 U/L — ABNORMAL HIGH (ref 98–192)

## 2020-03-20 NOTE — Telephone Encounter (Signed)
I had prescribed her a Valium to take for the MRI when I saw her last month.  Can we confirm if she picked this up?  If she picked up this prescription and lost the tablet, we can refill it with one tablet but she must take care not to lose it again.

## 2020-03-31 ENCOUNTER — Ambulatory Visit (HOSPITAL_BASED_OUTPATIENT_CLINIC_OR_DEPARTMENT_OTHER)
Admission: RE | Admit: 2020-03-31 | Discharge: 2020-03-31 | Disposition: A | Discharge status: Home / Self Care | Payer: TRICARE For Life (TFL) | Source: Ambulatory Visit | Attending: Hematology & Oncology | Admitting: Hematology & Oncology

## 2020-03-31 ENCOUNTER — Telehealth: Payer: Self-pay | Admitting: *Deleted

## 2020-03-31 ENCOUNTER — Other Ambulatory Visit: Payer: Self-pay

## 2020-03-31 DIAGNOSIS — I82412 Acute embolism and thrombosis of left femoral vein: Secondary | ICD-10-CM | POA: Insufficient documentation

## 2020-03-31 DIAGNOSIS — C181 Malignant neoplasm of appendix: Secondary | ICD-10-CM

## 2020-03-31 MED ORDER — IOHEXOL 300 MG/ML  SOLN
100.0000 mL | Freq: Once | INTRAMUSCULAR | Status: AC | PRN
  Administered 2020-03-31: 100 mL via INTRAVENOUS

## 2020-03-31 NOTE — Telephone Encounter (Signed)
-----   Message from Volanda Napoleon, MD sent at 03/31/2020  1:01 PM EDT ----- Call - NO blood clots in the legs!  [name]

## 2020-04-01 ENCOUNTER — Encounter: Payer: Self-pay | Admitting: *Deleted

## 2020-04-01 ENCOUNTER — Telehealth: Payer: Self-pay | Admitting: *Deleted

## 2020-04-01 NOTE — Telephone Encounter (Signed)
-----   Message from Volanda Napoleon, MD sent at 04/01/2020  9:20 AM EDT ----- Call - the CT scan looks great!!  NO cancer!! [name]

## 2020-04-06 ENCOUNTER — Other Ambulatory Visit: Payer: Self-pay

## 2020-04-06 ENCOUNTER — Ambulatory Visit
Admission: RE | Admit: 2020-04-06 | Discharge: 2020-04-06 | Disposition: A | Discharge status: Home / Self Care | Source: Ambulatory Visit | Attending: Neurology | Admitting: Neurology

## 2020-04-06 DIAGNOSIS — R55 Syncope and collapse: Secondary | ICD-10-CM

## 2020-04-06 DIAGNOSIS — R413 Other amnesia: Secondary | ICD-10-CM

## 2020-04-06 DIAGNOSIS — D320 Benign neoplasm of cerebral meninges: Secondary | ICD-10-CM

## 2020-04-06 DIAGNOSIS — R410 Disorientation, unspecified: Secondary | ICD-10-CM

## 2020-04-06 DIAGNOSIS — K754 Autoimmune hepatitis: Secondary | ICD-10-CM

## 2020-04-06 MED ORDER — GADOBENATE DIMEGLUMINE 529 MG/ML IV SOLN
20.0000 mL | Freq: Once | INTRAVENOUS | Status: AC | PRN
  Administered 2020-04-06: 20 mL via INTRAVENOUS

## 2020-04-07 ENCOUNTER — Other Ambulatory Visit: Payer: Self-pay

## 2020-04-07 ENCOUNTER — Telehealth: Payer: Self-pay | Admitting: Neurology

## 2020-04-07 DIAGNOSIS — D320 Benign neoplasm of cerebral meninges: Secondary | ICD-10-CM

## 2020-04-07 NOTE — Telephone Encounter (Signed)
I called and discussed MRI results with Wendy Barber.  No cause seen for confusion or memory problems.  Again is noted the known meningioma.  Size seems stable but appears to be invading/compressing the straight sinus, which was not mentioned in reports of prior MRIs performed at outside hospital.  I would like to refer her to neurosurgery for evaluation to see if anything needs to be done.  She is agreeable to plan.

## 2020-04-10 LAB — HM DIABETES EYE EXAM

## 2020-04-16 ENCOUNTER — Encounter: Payer: Self-pay | Admitting: Counselor

## 2020-04-16 ENCOUNTER — Ambulatory Visit

## 2020-04-16 ENCOUNTER — Ambulatory Visit (INDEPENDENT_AMBULATORY_CARE_PROVIDER_SITE_OTHER): Admitting: Counselor

## 2020-04-16 ENCOUNTER — Other Ambulatory Visit: Payer: Self-pay

## 2020-04-16 DIAGNOSIS — F418 Other specified anxiety disorders: Secondary | ICD-10-CM

## 2020-04-16 DIAGNOSIS — Z87898 Personal history of other specified conditions: Secondary | ICD-10-CM

## 2020-04-16 DIAGNOSIS — Z789 Other specified health status: Secondary | ICD-10-CM

## 2020-04-16 DIAGNOSIS — F09 Unspecified mental disorder due to known physiological condition: Secondary | ICD-10-CM

## 2020-04-16 DIAGNOSIS — G4700 Insomnia, unspecified: Secondary | ICD-10-CM | POA: Diagnosis not present

## 2020-04-16 NOTE — Progress Notes (Signed)
[facility]  Barber Name: Wendy Barber MRN: [id number] Date of Birth: 1961-05-27 Age: 59 y.o. Education: 14 years  Referral Circumstances and Background Information  Wendy Barber is a 59 y.o., right-hand dominant, married woman with a history of hypertension, arthritis, essential tremor, T2DM, and an episode of presumed hepatic encephalopathy due to autoimmune hepatitis in January, 2021. 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 She consulted recently with Dr. Tomi Likens and complained of difficulties with memory and thinking since an 18 hour surgery related to her adeno mucinosis in 2005. During that workup she was also found to have a meningioma compressing/invading Wendy straight sinus. My thanks to Dr. Tomi Likens for his note and excellent summary of her previous medical history.   On interview, Wendy Barber reported that she has never had Wendy best long-term memory but she feels like she had a significant decline in her cognition after her cancer surgery in 2005 with further deterioration after her episode of hepatic encephalopathy in January, 2021. She stated that she frequently loses her train of thought and she forgets things that she has done (e.g., doctor's appointments). On detailed review of symptoms, she reported that she has problems with attention and concentration, memory, processing speed, and she feels like she is not good with judgment and problem solving. With respect to mood, she has a history of difficulties with depression that started in her mid 81s in Wendy midst of all her health problems. She reported that she feels tearful frequently and has had to stop watching crime shows and certain other programs related to tearfulness. She feels like she gets "a lot more  frustrated" and doesn't have Wendy patience or tolerance that she used to since her hospitalization in January. She has a lack of interest and it sounds like she doesn't get much pleasure out of life. She reported that "I don't ever really feel good." She has fibromyalgia and chronic pain and chronic fatigue syndrome, which she has been dealing with since at least 2009. She reported that she sleeps very poorly, she is often anxious at night and reported she only slept about 2 hours last night. She stated that she has lost about 50lbs over Wendy past year and she has not been trying to lose weight. She has a lot of physical difficulties and feels sick and nauseous much of Wendy time, since October 2019. She has had extensive workup and no definitive cause for her nausea has been determined.    With respect to functioning, Wendy Barber is on disability status, which she got in 2014 due to a combination of factors including her physical health issues, fibromyalgia, and Wendy like. Her cognitive difficulties result in diminished cognitive efficiency but it doesn't sound like she is unable to do anything she used to do. She is still driving and is not getting lost. She is independent with most of Wendy things that she used to do such as managing medications, cooking food, doing household chores, using Wendy community and Wendy like.   Past Medical History and Review of Relevant Studies   Barber Active Problem List   Diagnosis Date Noted  . 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 Goals of care, counseling/discussion 03/19/2020  . 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 DVT of deep femoral vein, left ([place]) 03/19/2020  . Pulmonary  embolism, bilateral ([place]) 03/19/2020  . Presence of IVC filter 03/19/2020  . Hepatic encephalopathy ([place]) 11/22/2019  . Confusion 11/21/2019  . Autoimmune hepatitis ([place]) 11/21/2019  . HTN (hypertension) 11/21/2019  . DM2 (diabetes mellitus,  type 2) ([place]) 11/21/2019  . AMS (altered mental status) 11/21/2019  . Closed right ankle fracture 08/27/2019    Review of Neuroimaging and Relevant Medical History: Wendy Barber has an MRI of Wendy brain from 04/06/2020 that shows a homogeneously enhancing partially calcified dural based mass along Wendy tentorium (likely meningioma per radiology) that is either invading or compressing Wendy straight sinus. Wendy Barber has been referred to neurosurgery for further evaluation and management of Wendy lesion. Her brain volume and morphology appear normal for age and there are no areas of conspicuous vascular change or leukoaraiosis.   Current Outpatient Medications  Medication Sig Dispense Refill  . amLODipine (NORVASC) 5 MG tablet Take 5 mg by mouth daily.    Marland Kitchen amLODIPine-Valsartan-HCTZ 5-160-12.5 MG TABS Take 1 tablet by mouth every morning.     Vella Kohler 75 MG TABS Take by mouth in Wendy morning and at bedtime.     . busPIRone (BUSPAR) 5 MG tablet Take 5 mg by mouth 3 (three) times daily as needed (anxiety).     . diazepam (VALIUM) 5 MG tablet Take 40 minutes prior to MRI (Barber not taking: Reported on 03/19/2020) 1 tablet 0  . Dulaglutide (TRULICITY) 6.57 QI/6.9GE SOPN Inject 0.75 mg into Wendy skin once a week. Tuesday    . DULoxetine (CYMBALTA) 20 MG capsule Take 3 capsules (60 mg total) by mouth 2 (two) times daily. (Barber not taking: Reported on 03/19/2020) 30 capsule 0  . ferrous sulfate 325 (65 FE) MG EC tablet Take 325 mg by mouth 3 (three) times a week.     Marland Kitchen FREESTYLE LITE test strip     . furosemide (LASIX) 20 MG tablet Take 20 mg by mouth daily as needed.     . insulin glargine (LANTUS) 100 UNIT/ML injection Inject 0.1 mLs (10 Units total) into Wendy skin at bedtime. Before bed (Barber taking differently: Inject 14 Units into Wendy skin at bedtime. Before bed ) 10 mL 11  . insulin lispro (HUMALOG) 100 UNIT/ML injection Inject 0.08 mLs (8 Units total) into Wendy skin 3 (three) times daily before  meals. (Barber taking differently: Inject 12 Units into Wendy skin 3 (three) times daily before meals. ) 10 mL 11  . lactulose (CHRONULAC) 10 GM/15ML solution Take 45 mLs (30 g total) by mouth 3 (three) times daily. 236 mL 0  . metFORMIN (GLUCOPHAGE) 500 MG tablet Take 500 mg by mouth 2 (two) times daily with a meal.     . omeprazole (PRILOSEC) 40 MG capsule Take 40 mg by mouth at bedtime.    . ondansetron (ZOFRAN) 8 MG tablet Take 8 mg by mouth 3 (three) times daily.    Marland Kitchen oxyCODONE (OXY IR/ROXICODONE) 5 MG immediate release tablet Take 5 mg by mouth 2 (two) times daily as needed.    . propranolol (INDERAL) 20 MG tablet Take 20 mg by mouth 2 (two) times daily.     . sucralfate (CARAFATE) 1 g tablet Take 1 g by mouth 2 (two) times daily.      No current facility-administered medications for this visit.  Wendy Barber is no longer taking Cymbalta.   No family history on file.  There a family history of dementia, she has two maternal aunts that developed Wendy condition in their 72's  and 50's (Wendy Barber wasn't sure exactly when they developed Wendy condition but said it was young). There were 8 children in that family. She said that her brother has "lost of memory issues" although he also has a number of medical problems and it doesn't sound like he is demented. She had 4 siblings, 2 sisters passed when they were younger. None of them have dementia. There is no  family history of psychiatric illness.  Psychosocial History  Developmental, Educational and Employment History: Wendy Barber is a native of [place]. She reported that she was a "really good student" who earned mainly straight A's, was never held back and didn't have any learning problems. She graduated high school and then went to [employer] in [place], and planned to study nursing, but she got married and had children and did not finish. 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   Psychiatric History: Wendy Barber stated that she started having difficulties with depression in her late 19s, after her children started leaving home. She started developing back and joint problems and a lot of pain and also had some substance use issues in her family (prescription drugs). That further complicated her situation, because she was prescribed pain medications for her pain that she did not want to take. She has been through several courses of psychotherapy, typically no longer than a few months, and she is in counseling now. She has never seen a psychiatrist. She is currently in counseling with a provider Danise Mina with [facility] who is a Licensed Glass blower/designer. Wendy Barber denied any history of inpatient psychiatric admission, suicide attempts, or current suicidality.   Substance Use History: Wendy Barber does not smoke, consume alcohol, or use illict drugs.   Relationship History and Living Cimcumstances: Wendy Barber has been married to her husband for 37 years. She has three children, her daughter lives here locally. Her husband works in [place] and is gone most of Wendy week, so she is alone much of Wendy time. She has three grandchildren, which is one of Wendy reasons why she relocated to [place].   Mental Status and Behavioral Observations  Sensorium/Arousal: Wendy Barber's level of arousal was awake and alert. Hearing and vision were adequate for testing purposes. Orientation: Wendy Barber was fully oriented to person, place, time, and situation.  Appearance: Dressed in appropriate, casual clothing with reasonable grooming and hygiene.  Behavior: Wendy Barber was pleasant and  appropriate.  Speech/language: Normal in rate, rhythm, volume, and prosody. No word finding errors or paraphasias noted.  Gait/Posture: Normal at last exam with Dr. Tomi Likens Movement: No overt signs/symptoms of movement disorder Social Comportment: Pleasant, appropriate Mood: "I have some issues with depression" not clear if she has a subjective sense of sadness Affect: Dysphoric and anxious at times during testing as per technician Thought process/content: Thought process was logical, linear, goal-directed. She was able to provide a detailed personal history and timeline. Thought content was appropriate. Safety: No thoughts of harming self or others identified on self-report questionnaires or on direct questioning.  Insight: Wendy Barber Cognitive Assessment  04/16/2020  Visuospatial/ Executive (0/5) 5  Naming (0/3) 3  Attention: Read list of digits (0/2) 2  Attention: Read list of letters (0/1) 1  Attention: Serial 7 subtraction starting at 100 (0/3) 3  Language: Repeat phrase (0/2) 1  Language : Fluency (0/1) 1  Abstraction (0/2) 2  Delayed Recall (0/5) 3  Orientation (0/6) 6  Total 27  Adjusted Score (based on education) 27   Test Procedures  Wide Range Achievement Test - 4             Word Reading Doy Mince' Intellectual Screening Test Neuropsychological Assessment Battery  Memory Module  Naming  Digit Span Repeatable Battery for Wendy Assessment of Neuropsychological Status (Form A)  Figure Copy  Judgment of Line Orientation  Coding  Figure Recall Wendy Dot Counting Test A Random Letter Test Controlled Oral Word Association (F-A-S) Semantic Fluency (Animals) Trail Making Test A & B Complex Ideational Material Modified [place] Card Sorting Test Barber Health Questionnaire - 9 GAD-7  Plan  Wendy Barber was seen for a psychiatric diagnostic evaluation and neuropsychological testing. She is a very pleasant, 59 year old, right-hand dominant married woman with a history  of depression, myxomatous peritonitis secondary to adenocarinoma of Wendy appendix with lengthy surgery in 2005, and more recently hepatic encephalopathy secondary to autoimmune hepatitis in January, 2021. She feels as though her memory and thinking have not been good since 2005 and are worse since her recent hospitalization and was referred by Dr. Tomi Likens for full cognitive evaluation. On initial impression, she presents as quite lucid, did well on cognitive screening, and likely has no more than minimal cognitive issues but testing should be helpful to clarify that. Full and complete note with impressions, recommendations, and interpretation of test data to follow.   Wendy Barber Wendy Kindred, PsyD, [facility] Clinical Neuropsychologist  Informed Consent and Coding/Compliance  Risks and benefits of Wendy evaluation were discussed with Wendy Barber prior to all testing procedures. I conducted a clinical interview and neuropsychological testing (at least two tests) with Wendy Barber and Wendy Barber, B.S. (Technician) assisted me in administering additional test procedures. Wendy Barber was able to tolerate Wendy testing procedures and Wendy Barber (and/or family if applicable) is likely to benefit from further follow up to receive Wendy diagnosis and treatment recommendations, which will be rendered at Wendy next encounter. Billing below reflects technician time, my direct face-to-face time with Wendy Barber, time spent in test administration, and time spent in professional activities including but not limited to: neuropsychological test interpretation, integration of neuropsychological test data with clinical history, report preparation, treatment planning, care coordination, and review of diagnostically pertinent medical history or studies.   Services associated with this encounter: Clinical Interview [phone]) plus 60 minutes (64158; Neuropsychological Evaluation by Professional)  155 minutes (30940; Neuropsychological  Evaluation by Professional, Adl.) 23 minutes (76808; Test Administration by Professional) 30 minutes (81103; Neuropsychological Testing by Technician) 70 minutes (15945; Neuropsychological Testing by Technician, Adl.)

## 2020-04-16 NOTE — Progress Notes (Signed)
   Psychometrist Note   Cognitive testing was administered to Wendy Barber by Lamar Benes, B.S. (Technician) under the supervision of Alphonzo Severance, Psy.D., ABN. [name] was able to tolerate all test procedures. Dr. Nicole Kindred met with the patient as needed to manage any emotional reactions to the testing procedures. Rest breaks were offered.    The battery of tests administered was selected by Dr. Nicole Kindred with consideration to the patient's current level of functioning, the nature of her symptoms, emotional and behavioral responses during the interview, level of literacy, observed level of motivation/effort, and the nature of the referral question. This battery was communicated to the psychometrist. Communication between Dr. Nicole Kindred and the psychometrist was ongoing throughout the evaluation and Dr. Nicole Kindred was immediately accessible at all times. Dr. Nicole Kindred provided supervision to the technician on the date of this service, to the extent necessary to assure the quality of all services provided.    Ms. Porta will return in approximately one week for an interactive feedback session with Dr. Nicole Kindred, at which time female test performance, clinical impressions, and treatment recommendations will be reviewed in detail. The patient understands she can contact our office should she require our assistance before this time.   A total of 100 minutes of billable time were spent with Wendy Barber by the technician, including test administration and scoring time. Billing for these services is reflected in Dr. Les Pou note.   This note reflects time spent with the psychometrician and does not include test scores, clinical history, or any interpretations made by Dr. Nicole Kindred. The full report will follow in a separate note.

## 2020-04-17 NOTE — Progress Notes (Signed)
[name]  Patient Name: Wendy Barber MRN: [id number] Date of Birth: August 28, 1961 Age: 59 y.o. Education: 14 years  Measurement properties of test scores: IQ, Index, and Standard Scores (SS): Mean = 100; Standard Deviation = 15 Scaled Scores (Ss): Mean = 10; Standard Deviation = 3 Z scores (Z): Mean = 0; Standard Deviation = 1 T scores (T); Mean = 50; Standard Deviation = 10  TEST SCORES:    Note: This summary of test scores accompanies the interpretive report and should not be interpreted by unqualified individuals or in isolation without reference to the report. Test scores are relative to age, gender, and educational history as available and appropriate.   Performance Validity        "A" Random Letter Test Raw  Descriptor      Errors 1 Within Expectation  The Dot Counting Test: 11 Within Expectation      Mental Status Screening     Total Score Descriptor  MoCA 27 Normal      Expected Functioning        Wide Range Achievement Test: Standard/Scaled Score Percentile      Word Reading 116 86      Re[place] Intellectual Screening Test Standard/T-score Percentile      Guess What 49 47      Odd Item Out 52 58  RIST Index 101 53      Attention/Processing Speed        Neuropsychological Assessment Battery (Attention Module, Form 1): Scaled/T-score Percentile     Digits Forward 51 54     Digits Backwards 61 86      Repeatable Battery for the Assessment of Neuropsychological Status (Form A): Standard Score Percentile     Coding 6 9      Language        Neuropsychological Assessment Battery (Language Module, Form 1): T-score Percentile      Naming   (31) 55 69      Verbal Fluency:         Controlled Oral Word Association (F-A-S) 40 16      Semantic Fluency (Animals) 39 14      Memory:        Neuropsychological Assessment Battery (Memory Module, Form 1): T-score/Standard Score Percentile  Memory Index (MEM): 105 63      List  Learning           List A Immediate Recall   (7 , 10 , 8) 50 50         List B Immediate Recall   (5) 52 58         List A Short Delayed Recall   (6) 40 16         List A Long Delayed Recall   (7) 46 34         List A Long Delayed Yes/No Recognition Hits   (11) --- 58         List A Long Delayed Yes/No Recognition False Alarms   (2) --- 54         List A Recognition Discriminability Index --- 54      Shape Learning           Immediate Recognition   (6 , 8 , 6) 58 79         Delayed Recognition   (7) 57 75         Delayed Forced-Choice Recognition Hits   (8) --- 62  Delayed Forced-Choice Recognition False Alarms   (0) --- 73         Delayed Forced-Choice Recognition Discriminability --- 73     Story Learning           Immediate Recall   (32, 36) 53 62         Delayed Recall   (38) 61 86      Daily Living Memory            Immediate Recall   (26, 22) 61 86          Delayed Recall   (7, 8) 51 54          Recognition Hits   (10) --- 79      Repeatable Battery for the Assessment of Neuropsychological Status (Form A): Scaled Score Percentile         Figure Recall   (17) 13 84      Visuospatial/Constructional Functioning        Repeatable Battery for the Assessment of Neuropsychological Status (Form A): Standard/Scaled Score Percentile      Visuospatial/Constructional Index 102 55          Figure Copy 11 63          Judgment of Line Orientation --- 26-50      Executive Functioning        Modified [employer] Test (MWCST): Standard/T-Score Percentile      Number of Categories Correct 55 70      Number of Perseverative Errors 63 91      Number of Total Errors 58 79      Percent Perseverative Errors 62 89  Executive Function Composite 115 85      Trail Making Test: T-Score Percentile      Part A 43 25      Part B 49 46      [place] Diagnostic Aphasia Exam: Raw Score Scaled Score      Complex Ideational Material 12 12      Clock Drawing Raw Score Descriptor       Command 10 WNL      Rating Scales         Raw Score Descriptor  Patient Health Questionnaire - 9 20 Severe Depression  GAD-7 16 Severe Depression   Michaella Imai V. Nicole Kindred PsyD, [place] Clinical Neuropsychologist

## 2020-04-20 NOTE — Progress Notes (Signed)
[facility]  Patient Name: Doylene Splinter MRN: [id number] Date of Birth: 1961/03/27 Age: 59 y.o. Education: 14 years  Clinical Impressions  Wendy Barber is a 59 y.o., right-hand dominant, married woman with a complex medical history. 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 She appreciates some cognitive changes since her surgery in 2005 with subsequent deterioration after the episode of hepatic encephalopathy in January, 2021. She has a history of difficulties with depression that began in her 49s, related to health problems, and she continues to struggle in that regard. She is frequently tearful, gets frustrated easily, and does not have much interest or drive. She has an MRI that shows normal brain volume and morphology for age although there is a meningioma that is invading or compressing the straight sinus and she has been referred to Neurosurgery for follow up.   This is a normal neuropsychological study. Specifically, Ms. Vue performed well on measures of attention, memory, visuospatial/constructional functioning and executive function. There are a few low scores on a measure of processing speed and her verbal fluency was weak, which could be picking up on some subtle cognitive efficiency issues, but the test findings are not clearly abnormal. She did report severe levels of depression and anxiety on self-rating scales.   Cognitive profile is unremarkable. Nevertheless, subtle cognitive difficulties following general anaesthesia and acute delirium have been reported in the literature and normal neuropsychological performance does not rule out the possibility of some subtle difficulties. Her depression and anxiety are likely  contributing and she may also have some distraction related to her continuous feelings of nausea, pain due to fibromyalgia, and poor sleep.   Diagnostic Impressions: Other symptoms and signs involving cognitive functions and awareness Major depressive disorder, recurrent, moderate, with anxious distress  Recommendations to be discussed with patient  Your performance was consistent with essentially normal cognitive function. That is to say, you performed within expectations for you on measures of attention, memory, visuospatial/constructional function and executive functioning. There were a few low scores on a measure of processing speed and on verbal fluency tests that could be picking up on some of the cognitive efficiency issues that you note day-to-day but your performance was not clearly abnormal.   Normal neuropsychological performance does not rule out the possibility of subtle cognitive problems. Cognitive changes following general anaesthesia and altered mental status (e.g., hepatic encephalopathy) have been reported in the literature and it is possible that you do have some minor residua from those conditions. You also have a number of risk factors for reversible cognitive problems including significant depression and anxiety, pain, distraction related to your constant nausea, and sleep problems. I think that treating those underlying issues is the best way to get you back on track.   With regard to depression and anxiety, I would suggest that you consult with a psychiatrist or with your PCP about medications for mood. Cymbalta is but one of numerous medications and there may be a more effective option for you. You also might consider psychotherapy (I.e., counseling), which could help you process everything that has been going on, learn better coping strategies, and help you regain your happiness.   There is a significant research base and evidence of effectiveness for something called  "behavioral activation," which is a fancy way of saying that you should  increase your activity level. In general, people do not feel as happy or do as well when they are not doing much. This can include things like getting out for walks, re-engaging in hobbies, spending time with family or friends, or learning a new hobby. It's not so important what you do as that you enjoy it and stick with it. Depression can start a vicious cycle where you are not doing a lot because you don't feel well, which leads to less things to be excited and happy about, and thus more depression and behavioral avoidance. It can be hard to change this pattern once it has started but most people find that they feel better when they start doing more even if they don't enjoy it at first.   There are few things as disruptive to brain functioning as not getting a good night's sleep. For sleep, I recommend against using medications, which can have lingering sedating effects on the brain and rob your brain of restful REM sleep. Instead, consider trying some of the following sleep hygiene recommendations. They may not work at once and may take effort, but the effort you spend is likely to be rewarded with better sleep eventually:  . Stick to a sleep schedule of the same bedtime and wake time even on the weekends, which can help to regulate your body's internal clock so that you fall asleep and stay asleep.  . Practice a relaxing bedtime ritual (conducted away from bright lights) which will help separate your sleep from stimulating activities and prepare your body to fall asleep when you go to bed.  . Avoid naps, especially in the afternoon.  . Evaluate your room and create conditions that will promote sleep such as keeping it cool (between 60 - 67 degrees), quiet, and free from any lights. Consider using blackout curtains, a "white noise" generator, or fan that will help mask any noises that might prevent you from going to sleep or awaken you  during the night.  . Sleep on a comfortable mattress and pillows.  . Avoid bright light in the evening and excessive use of portable electronic devices right before bed that may contain light frequencies that can contribute to sleep problems.  . Avoid alcohol, cigarettes, or heavy meals in the evening. If you must eat, consume a light snack 45 minutes before bed.  . Use your bed only for sleep to strengthen the association between your bed and sleep.  . If you can't go to sleep within 30 minutes, go into another room and do something relaxing until you feel tired. Then, come back and try to go to sleep again for 30 minutes and repeat until sleep is achieved.  . Some people find over the counter melatonin to be helpful for sleep, which you could discuss with a pharmacist or prescribing provider.   Test Findings  Test scores are summarized in additional documentation associated with this encounter. Test scores are relative to age, gender, and educational history as available and appropriate. There no concerns about performance validity as all findings fell within normal expectations.   General Intellectual Functioning/Achievement:  Performance on single word reading was high average whereas her RIST index was average, rendering average to high average reasonable standards of comparison for this patient's cognitive test performance. Performance was average on the visually and verbally oriented subtests of the RIST.   Attention and Processing Efficiency: Performance on indicators of attention and working memory was strong with average digit repetition forward and high average  digit repetition backward.   By contrast, performance was somewhat weak on a measure of timed number-symbol coding with an unusually low score. She performed at a low average level on simple numeric sequencing, which is a very easy test.   Language: Performance on visual object confrontation naming was normal. Sensitive verbal  fluency measures clustered in the low average range with comparable scores for phonemic and category tasks.   Visuospatial Function: Visuospatial and constructional functioning was average at an index level. Copy of a line drawing and judgement of angular line orientations were both average.   Learning and Memory: Performance on measures of learning and memory was average, on the Memory Index (MEM) of the NAB. She demonstrated adequate acquisition and retention of information across time in both the visual and verbal modalities.   In the verbal realm, immediate and delayed recall of a 12-item word list were average with comparable average range recognition disriminability. Memory for a short story was average on immediate recall and high average on delayed recall. Memory for brief daily-living type information was high average on immediate recall and average on delayed recall. Recognition of the information was high average.   In the visual realm, she achieved high average score for immediate and delayed recognition of a series of designs that are difficult to verbally encode with comparable average range yes/no recognition. Delayed free recall of a modestly complex figure stimulus was high average after an approximately 10 minute delay interval.   Executive Functions: Performance on all tasks within this domain was within normal limits, with a high average score on the Executive Function Composite of the [employer]. This is a rule-based categorization procedure emphasizing concept formation and cognitive flexibility in response to ongoing feedback. Alternating sequencing of numbers and letters of the alphabet was also average. Reasoning with verbal information on the Complex Ideational Material was average. Clock drawing was within normal limits with well formed clock face, accurate number placement and hand position.   Rating Scale(s): Ms. Voth scored in the severe range for  symptoms associated with depression and anxiety, including feeling down, depressed, or hopeless nearly every day over the past two weeks. She stated that her symptoms have made it "very difficult" for her to function. She nevertheless denied any suicidal ideation, plan, or intent and there were no safety concerns.   Viviano Simas Nicole Kindred PsyD, [facility] Clinical Neuropsychologist

## 2020-04-23 ENCOUNTER — Encounter: Payer: Self-pay | Admitting: Counselor

## 2020-04-23 ENCOUNTER — Ambulatory Visit (INDEPENDENT_AMBULATORY_CARE_PROVIDER_SITE_OTHER): Admitting: Counselor

## 2020-04-23 ENCOUNTER — Other Ambulatory Visit: Payer: Self-pay

## 2020-04-23 DIAGNOSIS — F418 Other specified anxiety disorders: Secondary | ICD-10-CM | POA: Diagnosis not present

## 2020-04-23 DIAGNOSIS — R4189 Other symptoms and signs involving cognitive functions and awareness: Secondary | ICD-10-CM | POA: Diagnosis not present

## 2020-04-23 NOTE — Patient Instructions (Signed)
Your performance was consistent with essentially normal cognitive function. That is to say, you performed within expectations for you on measures of attention, memory, visuospatial/constructional function and executive functioning. There were a few low scores on a measure of processing speed and on verbal fluency tests that could be picking up on some of the cognitive efficiency issues that you note day-to-day but your performance was not clearly abnormal.   Normal neuropsychological performance does not rule out the possibility of subtle cognitive problems. Cognitive changes following general anaesthesia and altered mental status (e.g., hepatic encephalopathy) have been reported in the literature and it is possible that you do have some minor residua from those conditions. You also have a number of risk factors for reversible cognitive problems including significant depression and anxiety, pain, distraction related to your constant nausea, and sleep problems. I think that treating those underlying issues is the best way to get you back on track.   With regard to depression and anxiety, I would suggest that you consult with a psychiatrist or with your PCP about medications for mood. Cymbalta is but one of numerous medications and there may be a more effective option for you. You also might consider psychotherapy (I.e., counseling), which could help you process everything that has been going on, learn better coping strategies, and help you regain your happiness.   There is a significant research base and evidence of effectiveness for something called "behavioral activation," which is a fancy way of saying that you should increase your activity level. In general, people do not feel as happy or do as well when they are not doing much. This can include things like getting out for walks, re-engaging in hobbies, spending time with family or friends, or learning a new hobby. It's not so important what you do as  that you enjoy it and stick with it. Depression can start a vicious cycle where you are not doing a lot because you don't feel well, which leads to less things to be excited and happy about, and thus more depression and behavioral avoidance. It can be hard to change this pattern once it has started but most people find that they feel better when they start doing more even if they don't enjoy it at first.   There are few things as disruptive to brain functioning as not getting a good night's sleep. For sleep, I recommend against using medications, which can have lingering sedating effects on the brain and rob your brain of restful REM sleep. Instead, consider trying some of the following sleep hygiene recommendations. They may not work at once and may take effort, but the effort you spend is likely to be rewarded with better sleep eventually:   Stick to a sleep schedule of the same bedtime and wake time even on the weekends, which can help to regulate your body's internal clock so that you fall asleep and stay asleep.   Practice a relaxing bedtime ritual (conducted away from bright lights) which will help separate your sleep from stimulating activities and prepare your body to fall asleep when you go to bed.   Avoid naps, especially in the afternoon.   Evaluate your room and create conditions that will promote sleep such as keeping it cool (between 60 - 67 degrees), quiet, and free from any lights. Consider using blackout curtains, a "white noise" generator, or fan that will help mask any noises that might prevent you from going to sleep or awaken you during the night.  Sleep on a comfortable mattress and pillows.   Avoid bright light in the evening and excessive use of portable electronic devices right before bed that may contain light frequencies that can contribute to sleep problems.   Avoid alcohol, cigarettes, or heavy meals in the evening. If you must eat, consume a light snack 45 minutes  before bed.   Use your bed only for sleep to strengthen the association between your bed and sleep.   If you can't go to sleep within 30 minutes, go into another room and do something relaxing until you feel tired. Then, come back and try to go to sleep again for 30 minutes and repeat until sleep is achieved.   Some people find over the counter melatonin to be helpful for sleep, which you could discuss with a pharmacist or prescribing provider.

## 2020-04-23 NOTE — Progress Notes (Signed)
[facility]  Telemedicine statement:  I discussed the limitations of neuropsychological care via telemedicine and the availability of in person appointments. The patient expressed understanding and agreed to proceed. The patient was verified with two identifiers.  The visit modality was: telephonic The patient location was: home The provider location was: office  The following individuals participated: Astryd Pearcy  Feedback Note: I met with Wendy Barber to review the findings resulting from her neuropsychological evaluation. Since the last appointment, she is about the same. Her life is hectic because she is moving into another house, they are having it built, and they sold their previous residence. Accordingly, she will have some time when she does not have a home of her own to live in. Time was spent reviewing the impressions and recommendations that are detailed in the evaluation report. We discussed impression of essentially normal cognitive performance, albeit with the possibility of some mild post-surgical post-anaesthesia cognitive issues. I counseled her about the link between homeostasis, physical health, and cognitive performance. We also discussed modifiable risk factors for cognitive impairment including depression/anxiety, poor sleep, and mediation side effects. She wanted to hold off on a referral for psychotherapy at this time because she is away I took time to explain the findings and answer all the patient's questions. I encouraged [name] to contact me should she have any further questions or if further follow up is desired.   Current Medications and Medical History   Current Outpatient Medications  Medication Sig Dispense Refill  . amLODipine (NORVASC) 5 MG tablet Take 5 mg by mouth daily.    Marland Kitchen amLODIPine-Valsartan-HCTZ 5-160-12.5 MG TABS Take 1 tablet by mouth every morning.     Vella Kohler 75 MG TABS Take by mouth  in the morning and at bedtime.     . busPIRone (BUSPAR) 5 MG tablet Take 5 mg by mouth 3 (three) times daily as needed (anxiety).     . diazepam (VALIUM) 5 MG tablet Take 40 minutes prior to MRI (Patient not taking: Reported on 03/19/2020) 1 tablet 0  . Dulaglutide (TRULICITY) 2.12 YQ/8.2NO SOPN Inject 0.75 mg into the skin once a week. Tuesday    . DULoxetine (CYMBALTA) 20 MG capsule Take 3 capsules (60 mg total) by mouth 2 (two) times daily. (Patient not taking: Reported on 03/19/2020) 30 capsule 0  . ferrous sulfate 325 (65 FE) MG EC tablet Take 325 mg by mouth 3 (three) times a week.     Marland Kitchen FREESTYLE LITE test strip     . furosemide (LASIX) 20 MG tablet Take 20 mg by mouth daily as needed.     . insulin glargine (LANTUS) 100 UNIT/ML injection Inject 0.1 mLs (10 Units total) into the skin at bedtime. Before bed (Patient taking differently: Inject 14 Units into the skin at bedtime. Before bed ) 10 mL 11  . insulin lispro (HUMALOG) 100 UNIT/ML injection Inject 0.08 mLs (8 Units total) into the skin 3 (three) times daily before meals. (Patient taking differently: Inject 12 Units into the skin 3 (three) times daily before meals. ) 10 mL 11  . lactulose (CHRONULAC) 10 GM/15ML solution Take 45 mLs (30 g total) by mouth 3 (three) times daily. 236 mL 0  . metFORMIN (GLUCOPHAGE) 500 MG tablet Take 500 mg by mouth 2 (two) times daily with a meal.     . omeprazole (PRILOSEC) 40 MG capsule Take 40 mg by mouth at bedtime.    . ondansetron (ZOFRAN) 8  MG tablet Take 8 mg by mouth 3 (three) times daily.    Marland Kitchen oxyCODONE (OXY IR/ROXICODONE) 5 MG immediate release tablet Take 5 mg by mouth 2 (two) times daily as needed.    . propranolol (INDERAL) 20 MG tablet Take 20 mg by mouth 2 (two) times daily.     . sucralfate (CARAFATE) 1 g tablet Take 1 g by mouth 2 (two) times daily.      No current facility-administered medications for this visit.    Patient Active Problem List   Diagnosis Date Noted  . 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 Goals of care, counseling/discussion 03/19/2020  . 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 DVT of deep femoral vein, left ([place]) 03/19/2020  . Pulmonary embolism, bilateral ([place]) 03/19/2020  . Presence of IVC filter 03/19/2020  . Hepatic encephalopathy ([place]) 11/22/2019  . Confusion 11/21/2019  . Autoimmune hepatitis ([place]) 11/21/2019  . HTN (hypertension) 11/21/2019  . DM2 (diabetes mellitus, type 2) ([place]) 11/21/2019  . AMS (altered mental status) 11/21/2019  . Closed right ankle fracture 08/27/2019   Mental Status and Behavioral Observations  Tyara Dassow was available at the prespecified time for this telephonic appointment and was alert and generally oriented (orientation not formally assessed). Speech was normal in rate, rhythm, volume, and prosody. Self-reported mood was "ok" and affect as assessed by vocal quality was neutral. Thought process was logical, linear, and goal directed and thought content was appropriate to the topics discussed. There were no safety concerns identified at today's encounter, such as thoughts of harming self or others.   Plan  Feedback provided regarding the patient's neuropsychological evaluation. I reassured her that it does not appear she has a progressive condition, which she was glad to hear. She may have some minor post-surgical, post-anaesthesia cognitive changes but the primary contributors are likely poor sleep, depression/anxiety (she stopped taking Cymbalta), and possibly also medication side effects. Her husband agrees that counseling would be a good idea although we will wait to make a referral until she returns from abroad (going to the [place] to visit son).  Wendy Barber was encouraged to contact me if any questions arise or if further follow up is desired.   Wendy Simas Nicole Kindred, PsyD, [employer] Clinical Neuropsychologist  Service(s) Provided at This Encounter: 41 minutes  [phone]; Conjoint therapy with patient present)

## 2020-05-01 ENCOUNTER — Other Ambulatory Visit: Payer: Self-pay

## 2020-05-01 ENCOUNTER — Inpatient Hospital Stay: Payer: TRICARE For Life (TFL)

## 2020-05-01 ENCOUNTER — Inpatient Hospital Stay: Payer: TRICARE For Life (TFL) | Attending: Hematology & Oncology | Admitting: Hematology & Oncology

## 2020-05-01 ENCOUNTER — Telehealth: Payer: Self-pay | Admitting: Hematology & Oncology

## 2020-05-01 VITALS — BP 109/56 | HR 72 | Temp 97.8°F | Resp 19 | Wt 254.0 lb

## 2020-05-01 DIAGNOSIS — Z7901 Long term (current) use of anticoagulants: Secondary | ICD-10-CM | POA: Insufficient documentation

## 2020-05-01 DIAGNOSIS — Z85038 Personal history of other malignant neoplasm of large intestine: Secondary | ICD-10-CM | POA: Diagnosis not present

## 2020-05-01 DIAGNOSIS — Z79899 Other long term (current) drug therapy: Secondary | ICD-10-CM | POA: Diagnosis not present

## 2020-05-01 DIAGNOSIS — C772 Secondary and unspecified malignant neoplasm of intra-abdominal lymph nodes: Secondary | ICD-10-CM

## 2020-05-01 DIAGNOSIS — Z885 Allergy status to narcotic agent status: Secondary | ICD-10-CM | POA: Insufficient documentation

## 2020-05-01 DIAGNOSIS — M549 Dorsalgia, unspecified: Secondary | ICD-10-CM | POA: Diagnosis not present

## 2020-05-01 DIAGNOSIS — C181 Malignant neoplasm of appendix: Secondary | ICD-10-CM | POA: Diagnosis not present

## 2020-05-01 DIAGNOSIS — D500 Iron deficiency anemia secondary to blood loss (chronic): Secondary | ICD-10-CM

## 2020-05-01 DIAGNOSIS — Z86718 Personal history of other venous thrombosis and embolism: Secondary | ICD-10-CM | POA: Insufficient documentation

## 2020-05-01 DIAGNOSIS — Z880 Allergy status to penicillin: Secondary | ICD-10-CM | POA: Diagnosis not present

## 2020-05-01 DIAGNOSIS — I82412 Acute embolism and thrombosis of left femoral vein: Secondary | ICD-10-CM

## 2020-05-01 DIAGNOSIS — E119 Type 2 diabetes mellitus without complications: Secondary | ICD-10-CM | POA: Insufficient documentation

## 2020-05-01 DIAGNOSIS — R609 Edema, unspecified: Secondary | ICD-10-CM | POA: Diagnosis not present

## 2020-05-01 LAB — CMP (CANCER CENTER ONLY)
ALT: 18 U/L (ref 0–44)
AST: 38 U/L (ref 15–41)
Albumin: 4 g/dL (ref 3.5–5.0)
Alkaline Phosphatase: 129 U/L — ABNORMAL HIGH (ref 38–126)
Anion gap: 10 (ref 5–15)
BUN: 21 mg/dL — ABNORMAL HIGH (ref 6–20)
CO2: 27 mmol/L (ref 22–32)
Calcium: 10.5 mg/dL — ABNORMAL HIGH (ref 8.9–10.3)
Chloride: 104 mmol/L (ref 98–111)
Creatinine: 1.05 mg/dL — ABNORMAL HIGH (ref 0.44–1.00)
GFR, Est AFR Am: >60 mL/min (ref >60)
GFR, Estimated: 59 mL/min — ABNORMAL LOW (ref >60)
Glucose, Bld: 102 mg/dL — ABNORMAL HIGH (ref 70–99)
Potassium: 3.7 mmol/L (ref 3.5–5.1)
Sodium: 141 mmol/L (ref 135–145)
Total Bilirubin: 0.7 mg/dL (ref 0.3–1.2)
Total Protein: 7.7 g/dL (ref 6.5–8.1)

## 2020-05-01 LAB — CBC WITH DIFFERENTIAL (CANCER CENTER ONLY)
Abs Immature Granulocytes: 0.02 10*3/uL (ref 0.00–0.07)
Basophils Absolute: 0.1 10*3/uL (ref 0.0–0.1)
Basophils Relative: 1 %
Eosinophils Absolute: 0.3 10*3/uL (ref 0.0–0.5)
Eosinophils Relative: 4 %
HCT: 36.3 % (ref 36.0–46.0)
Hemoglobin: 12.3 g/dL (ref 12.0–15.0)
Immature Granulocytes: 0 %
Lymphocytes Relative: 45 %
Lymphs Abs: 2.9 10*3/uL (ref 0.7–4.0)
MCH: 36.5 pg — ABNORMAL HIGH (ref 26.0–34.0)
MCHC: 33.9 g/dL (ref 30.0–36.0)
MCV: 107.7 fL — ABNORMAL HIGH (ref 80.0–100.0)
Monocytes Absolute: 0.8 10*3/uL (ref 0.1–1.0)
Monocytes Relative: 11 %
Neutro Abs: 2.6 10*3/uL (ref 1.7–7.7)
Neutrophils Relative %: 39 %
Platelet Count: 356 10*3/uL (ref 150–400)
RBC: 3.37 MIL/uL — ABNORMAL LOW (ref 3.87–5.11)
RDW: 16.3 % — ABNORMAL HIGH (ref 11.5–15.5)
WBC Count: 6.6 10*3/uL (ref 4.0–10.5)
nRBC: 0.9 % — ABNORMAL HIGH (ref 0.0–0.2)

## 2020-05-01 LAB — ANTITHROMBIN III: AntiThromb III Func: 113 % (ref 75–120)

## 2020-05-01 MED ORDER — RIVAROXABAN 10 MG PO TABS
10.0000 mg | ORAL_TABLET | Freq: Every day | ORAL | 3 refills | Status: DC
Start: 2020-05-01 — End: 2020-07-27

## 2020-05-01 NOTE — Progress Notes (Signed)
Hematology and Oncology Follow Up Visit  Wendy Barber [id number] [birth year] 59 y.o. 05/01/2020   Principle Diagnosis:   History of metastatic appendiceal cancer  History of thromboembolism of the left leg-2019  Current Therapy:    Observation     Interim History:  Wendy Barber is back for second office visit.  We first saw her back in late May.  At that time, we did go ahead and get her set up with a CT scan.  This was a follow-up.  The CT scan did not show any evidence of recurrent malignancy.  I would happily that she is cured of her appendiceal adenocarcinoma.  She was treated back in 2005.  She underwent the HIPEC procedure.  She had a blood clot after the procedure.  She had a filter placed.  She cannot remember how long she was on blood thinner.  In October 2019, she had a blood clot in the left leg.  She was on Lovenox and then Xarelto for about 3 months.  We did go ahead and repeat a Doppler of her left leg in June.  There is no evidence of thromboembolic disease in her leg.  She is headed off to [place] next Saturday.  I do want to have her on anticoagulation.  I think Xarelto would be a good idea for her.  I will put her on 10 mg daily.  She will start the day before she travels over to [place].  She will then take Lovenox for 10 days.  She will be in [place] for about 6 weeks.  She will do some traveling over to up to [place].  When she comes back, she will take the Lovenox again the day before she travels.  She will then take it for 10 additional days and stop.  She has had occasional swelling in her left leg.  When she lies down the swelling improves.  She has had no problems with cough or shortness of breath.  She has had no nausea or vomiting.  She does have the autoimmune hepatitis.  This seems to be relatively stable.  Overall, I would say her performance status is ECOG 1.  Medications:  Current Outpatient Medications:  .  naproxen (NAPRELAN) 500 MG 24 hr  tablet, Take 2 tablets by mouth as needed., Disp: , Rfl:  .  amLODipine (NORVASC) 5 MG tablet, Take 5 mg by mouth daily., Disp: , Rfl:  .  azaTHIOprine (IMURAN) 50 MG tablet, Take 50 mg by mouth in the morning, at noon, and at bedtime., Disp: , Rfl:  .  busPIRone (BUSPAR) 5 MG tablet, Take 5 mg by mouth 3 (three) times daily as needed (anxiety). , Disp: , Rfl:  .  Dulaglutide (TRULICITY) 9.32 TF/5.7DU SOPN, Inject 0.75 mg into the skin once a week. Tuesday, Disp: , Rfl:  .  furosemide (LASIX) 20 MG tablet, Take 20 mg by mouth daily as needed. , Disp: , Rfl:  .  insulin glargine (LANTUS) 100 UNIT/ML injection, Inject 0.1 mLs (10 Units total) into the skin at bedtime. Before bed (Patient taking differently: Inject 14 Units into the skin at bedtime. Before bed ), Disp: 10 mL, Rfl: 11 .  insulin lispro (HUMALOG) 100 UNIT/ML injection, Inject 0.08 mLs (8 Units total) into the skin 3 (three) times daily before meals. (Patient taking differently: Inject 12 Units into the skin 3 (three) times daily before meals. ), Disp: 10 mL, Rfl: 11 .  metFORMIN (GLUCOPHAGE) 500 MG tablet, Take 500 mg by  mouth 2 (two) times daily with a meal. , Disp: , Rfl:  .  omeprazole (PRILOSEC) 40 MG capsule, Take 40 mg by mouth at bedtime., Disp: , Rfl:  .  ondansetron (ZOFRAN) 8 MG tablet, Take 8 mg by mouth 3 (three) times daily., Disp: , Rfl:  .  oxyCODONE (OXY IR/ROXICODONE) 5 MG immediate release tablet, Take 5 mg by mouth 2 (two) times daily as needed., Disp: , Rfl:  .  promethazine (PHENERGAN) 12.5 MG tablet, Take 12.5 mg by mouth daily., Disp: , Rfl:  .  propranolol (INDERAL) 20 MG tablet, Take 20 mg by mouth 2 (two) times daily. , Disp: , Rfl:  .  rivaroxaban (XARELTO) 10 MG TABS tablet, Take 1 tablet (10 mg total) by mouth daily., Disp: 20 tablet, Rfl: 3 .  sucralfate (CARAFATE) 1 g tablet, Take 1 g by mouth 2 (two) times daily. , Disp: , Rfl:  .  valsartan-hydrochlorothiazide (DIOVAN-HCT) 160-12.5 MG tablet, Take 1 tablet  by mouth daily., Disp: , Rfl:   Allergies:  Allergies  Allergen Reactions  . Codeine Shortness Of Breath and Palpitations  . Penicillins Shortness Of Breath    Other reaction(s): Irregular Heart Rate, Other (See Comments) Rapid heartrate   . Alprazolam Hives and Other (See Comments)    Note: tolerates midazolam fine unresponsive Hard to arouse unresponsive Hard to arouse  Other reaction(s): Other (See Comments) unresponsive  . Ativan [Lorazepam] Other (See Comments)    Note: tolerates midazolam fine Face & Throat Swelling.  . Corticosteroids Other (See Comments)    Other reaction(s): Other (see comments) Psychotic behaivor Other reaction(s): Other (See Comments) Psychotic behaivor Psychotic behaivor Psychotic behaivor   . Erythromycin     Other reaction(s): Other (See Comments) Severe stomach pain   . Erythromycin Base Hives  . Milnacipran Hcl Other (See Comments)    Other reaction(s): Other (see comments) psychotic episode psychotic episode  Other reaction(s): Other (See Comments) psychotic episode  . Prednisone Other (See Comments)    Anxiety & Nervous Breakdown.  Ocie Cornfield  [Milnacipran]   . Povidone Iodine Rash    Had rash under bandage after surgery , has had betadine since without reaction   . Prednisolone Anxiety    Past Medical History, Surgical history, Social history, and Family History were reviewed and updated.  Review of Systems: Review of Systems  Constitutional: Negative.   HENT:  Negative.   Eyes: Negative.   Respiratory: Negative.   Cardiovascular: Negative.   Gastrointestinal: Negative.   Endocrine: Negative.   Genitourinary: Negative.    Musculoskeletal: Positive for back pain.  Skin: Negative.   Neurological: Negative.   Hematological: Negative.   Psychiatric/Behavioral: Negative.     Physical Exam:  weight is 254 lb (115.2 kg). Her oral temperature is 97.8 F (36.6 C). Her blood pressure is 109/56 (abnormal) and her pulse is  72. Her respiration is 19 and oxygen saturation is 96%.   Wt Readings from Last 3 Encounters:  05/01/20 254 lb (115.2 kg)  03/19/20 256 lb 6.4 oz (116.3 kg)  02/17/20 262 lb (118.8 kg)    Physical Exam Vitals reviewed.  HENT:     Head: Normocephalic and atraumatic.  Eyes:     Pupils: Pupils are equal, round, and reactive to light.  Cardiovascular:     Rate and Rhythm: Normal rate and regular rhythm.     Heart sounds: Normal heart sounds.  Pulmonary:     Effort: Pulmonary effort is normal.     Breath sounds:  Normal breath sounds.  Abdominal:     General: Bowel sounds are normal.     Palpations: Abdomen is soft.  Musculoskeletal:        General: No tenderness or deformity. Normal range of motion.     Cervical back: Normal range of motion.  Lymphadenopathy:     Cervical: No cervical adenopathy.  Skin:    General: Skin is warm and dry.     Findings: No erythema or rash.  Neurological:     Mental Status: She is alert and oriented to person, place, and time.  Psychiatric:        Behavior: Behavior normal.        Thought Content: Thought content normal.        Judgment: Judgment normal.      Lab Results  Component Value Date   WBC 6.5 03/19/2020   HGB 12.4 03/19/2020   HCT 36.3 03/19/2020   MCV 106.8 (H) 03/19/2020   PLT 311 03/19/2020     Chemistry      Component Value Date/Time   NA 141 05/01/2020 1156   K 3.7 05/01/2020 1156   CL 104 05/01/2020 1156   CO2 27 05/01/2020 1156   BUN 21 (H) 05/01/2020 1156   CREATININE 1.05 (H) 05/01/2020 1156      Component Value Date/Time   CALCIUM 10.5 (H) 05/01/2020 1156   ALKPHOS 129 (H) 05/01/2020 1156   AST 38 05/01/2020 1156   ALT 18 05/01/2020 1156   BILITOT 0.7 05/01/2020 1156      Impression and Plan: Wendy Barber is a very charming 59 year old white female.  She has incredible history.  I must says she probably has lived the longest with metastatic appendiceal carcinoma that I have seen.  Clearly I believe her  chance of cure from the HIPEC procedure is over 95%.  I think that she probably is at risk for thromboembolic disease just given her size.  She is diabetic.  I think that whenever she does travel by plane and going long distance, she will need some form of anticoagulation.  I think that the Xarelto is a reasonable idea to help minimize the risk of blood clots when she is flying.  I would like to see her back in 3 months.  I do not think we have to really do any additional scans on her unless she really has problems where the lab work shows some abnormalities.  I spent about 35 minutes with her today.   Volanda Napoleon, MD 7/9/20211:21 PM

## 2020-05-01 NOTE — Telephone Encounter (Signed)
Appointments scheduled and patient will get updates from My Chart per 7/9 los

## 2020-05-02 LAB — CANCER ANTIGEN 19-9: CA 19-9: 65 U/mL — ABNORMAL HIGH (ref 0–35)

## 2020-05-02 LAB — PROTEIN C, TOTAL: Protein C, Total: 87 % (ref 60–150)

## 2020-05-02 LAB — HOMOCYSTEINE: Homocysteine: 14.8 umol/L — ABNORMAL HIGH (ref 0.0–14.5)

## 2020-05-03 LAB — BETA-2-GLYCOPROTEIN I ABS, IGG/M/A
Beta-2 Glyco I IgG: <9 GPI IgG units (ref 0–20)
Beta-2-Glycoprotein I IgA: <9 GPI IgA units (ref 0–25)
Beta-2-Glycoprotein I IgM: <9 GPI IgM units (ref 0–32)

## 2020-05-03 LAB — CARDIOLIPIN ANTIBODIES, IGG, IGM, IGA
Anticardiolipin IgA: 13 APL U/mL — ABNORMAL HIGH (ref 0–11)
Anticardiolipin IgG: 66 GPL U/mL — ABNORMAL HIGH (ref 0–14)
Anticardiolipin IgM: <9 MPL U/mL (ref 0–12)

## 2020-05-04 ENCOUNTER — Telehealth: Payer: Self-pay | Admitting: *Deleted

## 2020-05-04 LAB — CEA (IN HOUSE-CHCC): CEA (CHCC-In House): <1 ng/mL (ref 0.00–5.00)

## 2020-05-04 NOTE — Telephone Encounter (Signed)
-----   Message from Wendy Napoleon, MD sent at 05/04/2020  8:02 AM EDT ----- Call please make sure that she is taking foic acid 2 mg po q day for the high homocysteine level.  Thanks!!!  [name]

## 2020-05-04 NOTE — Telephone Encounter (Signed)
Unable to reach pt, lmovm for pt to call office and confirm she is taking Folic Acid 2mg  daily.

## 2020-05-05 LAB — LUPUS ANTICOAGULANT PANEL
DRVVT: 32.4 s (ref 0.0–47.0)
PTT Lupus Anticoagulant: 42.4 s (ref 0.0–51.9)

## 2020-05-05 LAB — PROTEIN S, TOTAL: Protein S Ag, Total: 96 % (ref 60–150)

## 2020-05-05 LAB — PROTEIN C ACTIVITY: Protein C Activity: 109 % (ref 73–180)

## 2020-05-05 LAB — PROTEIN S ACTIVITY: Protein S Activity: 80 % (ref 63–140)

## 2020-05-06 LAB — FACTOR 5 LEIDEN

## 2020-05-07 ENCOUNTER — Encounter: Payer: Self-pay | Admitting: Hematology & Oncology

## 2020-05-07 LAB — PROTHROMBIN GENE MUTATION

## 2020-05-11 ENCOUNTER — Encounter: Payer: Self-pay | Admitting: *Deleted

## 2020-06-06 ENCOUNTER — Encounter: Payer: Self-pay | Admitting: Hematology & Oncology

## 2020-06-17 ENCOUNTER — Telehealth: Payer: Self-pay | Admitting: Neurology

## 2020-06-17 NOTE — Telephone Encounter (Signed)
Received vm msg from [facility] that patient is scheduled for 06/26/20 with Dr. Rae Mar.

## 2020-07-14 ENCOUNTER — Encounter: Payer: Self-pay | Admitting: *Deleted

## 2020-07-14 NOTE — Progress Notes (Signed)
Patient is out of town in [place]. She had to recently visit an ED in that area. Per the patient, the MD there expressed concern of cancer recurrence.   Called [facility] at [phone]  For medical records and scan reports, and they requested a written request be faxed to [phone]. Request faxed.

## 2020-07-15 ENCOUNTER — Other Ambulatory Visit: Payer: Self-pay | Admitting: Hematology & Oncology

## 2020-07-15 DIAGNOSIS — C772 Secondary and unspecified malignant neoplasm of intra-abdominal lymph nodes: Secondary | ICD-10-CM

## 2020-07-15 DIAGNOSIS — C181 Malignant neoplasm of appendix: Secondary | ICD-10-CM

## 2020-07-15 NOTE — Progress Notes (Signed)
Never received medical records. Patient was able send her CT report via MyChart. Report given to Dr [name]. He will place MRI order. Patient updated.

## 2020-07-16 ENCOUNTER — Encounter: Payer: Self-pay | Admitting: *Deleted

## 2020-07-16 ENCOUNTER — Other Ambulatory Visit: Payer: Self-pay | Admitting: *Deleted

## 2020-07-16 MED ORDER — DIAZEPAM 5 MG PO TABS: ORAL_TABLET | ORAL | 0 refills | Status: DC

## 2020-07-16 NOTE — Progress Notes (Signed)
MRI order placed and authorization obtained. MRI scheduled for 07/18/2020. I called patient and left message on her voicemail.   I was unable to made contact with patient, however the scheduler for radiology did speak to patient and confirmed appointment. Will follow for results to determine if patient should continue to be navigated.

## 2020-07-18 ENCOUNTER — Other Ambulatory Visit: Payer: Self-pay

## 2020-07-18 ENCOUNTER — Encounter (HOSPITAL_BASED_OUTPATIENT_CLINIC_OR_DEPARTMENT_OTHER): Payer: Self-pay

## 2020-07-18 ENCOUNTER — Ambulatory Visit (HOSPITAL_BASED_OUTPATIENT_CLINIC_OR_DEPARTMENT_OTHER)
Admission: RE | Admit: 2020-07-18 | Discharge: 2020-07-18 | Disposition: A | Discharge status: Home / Self Care | Source: Ambulatory Visit | Attending: Hematology & Oncology | Admitting: Hematology & Oncology

## 2020-07-18 DIAGNOSIS — C772 Secondary and unspecified malignant neoplasm of intra-abdominal lymph nodes: Secondary | ICD-10-CM

## 2020-07-18 DIAGNOSIS — C181 Malignant neoplasm of appendix: Secondary | ICD-10-CM

## 2020-07-24 ENCOUNTER — Ambulatory Visit (HOSPITAL_COMMUNITY)

## 2020-07-27 ENCOUNTER — Inpatient Hospital Stay (HOSPITAL_BASED_OUTPATIENT_CLINIC_OR_DEPARTMENT_OTHER): Payer: TRICARE For Life (TFL) | Admitting: Hematology & Oncology

## 2020-07-27 ENCOUNTER — Inpatient Hospital Stay: Payer: TRICARE For Life (TFL) | Attending: Hematology & Oncology

## 2020-07-27 ENCOUNTER — Other Ambulatory Visit: Payer: Self-pay

## 2020-07-27 ENCOUNTER — Encounter: Payer: Self-pay | Admitting: Hematology & Oncology

## 2020-07-27 VITALS — BP 129/63 | HR 66 | Temp 98.4°F | Resp 20 | Wt 248.1 lb

## 2020-07-27 DIAGNOSIS — Z79899 Other long term (current) drug therapy: Secondary | ICD-10-CM | POA: Diagnosis not present

## 2020-07-27 DIAGNOSIS — C181 Malignant neoplasm of appendix: Secondary | ICD-10-CM

## 2020-07-27 DIAGNOSIS — Z7901 Long term (current) use of anticoagulants: Secondary | ICD-10-CM | POA: Insufficient documentation

## 2020-07-27 DIAGNOSIS — K7460 Unspecified cirrhosis of liver: Secondary | ICD-10-CM | POA: Insufficient documentation

## 2020-07-27 DIAGNOSIS — Z885 Allergy status to narcotic agent status: Secondary | ICD-10-CM | POA: Diagnosis not present

## 2020-07-27 DIAGNOSIS — D500 Iron deficiency anemia secondary to blood loss (chronic): Secondary | ICD-10-CM

## 2020-07-27 DIAGNOSIS — Z86718 Personal history of other venous thrombosis and embolism: Secondary | ICD-10-CM | POA: Diagnosis not present

## 2020-07-27 DIAGNOSIS — Z880 Allergy status to penicillin: Secondary | ICD-10-CM | POA: Diagnosis not present

## 2020-07-27 DIAGNOSIS — C772 Secondary and unspecified malignant neoplasm of intra-abdominal lymph nodes: Secondary | ICD-10-CM | POA: Insufficient documentation

## 2020-07-27 LAB — CBC WITH DIFFERENTIAL (CANCER CENTER ONLY)
Abs Immature Granulocytes: 0.02 10*3/uL (ref 0.00–0.07)
Basophils Absolute: 0.1 10*3/uL (ref 0.0–0.1)
Basophils Relative: 1 %
Eosinophils Absolute: 0.3 10*3/uL (ref 0.0–0.5)
Eosinophils Relative: 5 %
HCT: 35.6 % — ABNORMAL LOW (ref 36.0–46.0)
Hemoglobin: 11.8 g/dL — ABNORMAL LOW (ref 12.0–15.0)
Immature Granulocytes: 0 %
Lymphocytes Relative: 40 %
Lymphs Abs: 2.7 10*3/uL (ref 0.7–4.0)
MCH: 35 pg — ABNORMAL HIGH (ref 26.0–34.0)
MCHC: 33.1 g/dL (ref 30.0–36.0)
MCV: 105.6 fL — ABNORMAL HIGH (ref 80.0–100.0)
Monocytes Absolute: 0.9 10*3/uL (ref 0.1–1.0)
Monocytes Relative: 13 %
Neutro Abs: 2.8 10*3/uL (ref 1.7–7.7)
Neutrophils Relative %: 41 %
Platelet Count: 319 10*3/uL (ref 150–400)
RBC: 3.37 MIL/uL — ABNORMAL LOW (ref 3.87–5.11)
RDW: 15.3 % (ref 11.5–15.5)
WBC Count: 6.7 10*3/uL (ref 4.0–10.5)
nRBC: 0.3 % — ABNORMAL HIGH (ref 0.0–0.2)

## 2020-07-27 LAB — CMP (CANCER CENTER ONLY)
ALT: 20 U/L (ref 0–44)
AST: 32 U/L (ref 15–41)
Albumin: 3.9 g/dL (ref 3.5–5.0)
Alkaline Phosphatase: 119 U/L (ref 38–126)
Anion gap: 7 (ref 5–15)
BUN: 19 mg/dL (ref 6–20)
CO2: 33 mmol/L — ABNORMAL HIGH (ref 22–32)
Calcium: 10.4 mg/dL — ABNORMAL HIGH (ref 8.9–10.3)
Chloride: 103 mmol/L (ref 98–111)
Creatinine: 0.83 mg/dL (ref 0.44–1.00)
GFR, Est AFR Am: >60 mL/min (ref >60)
GFR, Estimated: >60 mL/min (ref >60)
Glucose, Bld: 160 mg/dL — ABNORMAL HIGH (ref 70–99)
Potassium: 4.1 mmol/L (ref 3.5–5.1)
Sodium: 143 mmol/L (ref 135–145)
Total Bilirubin: 0.7 mg/dL (ref 0.3–1.2)
Total Protein: 7.5 g/dL (ref 6.5–8.1)

## 2020-07-27 LAB — IRON AND TIBC
Iron: 69 ug/dL (ref 41–142)
Saturation Ratios: 17 % — ABNORMAL LOW (ref 21–57)
TIBC: 420 ug/dL (ref 236–444)
UIBC: 351 ug/dL (ref 120–384)

## 2020-07-27 LAB — CEA (IN HOUSE-CHCC): CEA (CHCC-In House): <1 ng/mL (ref 0.00–5.00)

## 2020-07-27 LAB — FERRITIN: Ferritin: 28 ng/mL (ref 11–307)

## 2020-07-27 NOTE — Progress Notes (Signed)
Hematology and Oncology Follow Up Visit  Wendy Barber [id number] 12-27-1960 59 y.o. 07/27/2020   Principle Diagnosis:   History of metastatic appendiceal cancer  History of thromboembolism of the left leg-2019  Current Therapy:    Observation     Interim History:  Wendy Barber is back for follow-up.  Unfortunately, a lot has happened to her.  She actually had a wonderful time when she went to [place].  She really enjoyed over there.  However, coming home, she had some problems.  She hurt her back.  Her back seems to be a little bit worse.  She has had back surgery.  When she and her husband were down in [place], she began to have some back pain that was worse.  She went to a local ER.  She did they did a CT scan on her.  There is appear to be a mass in the right colon.  It has been 15 years since she had her surgery for the low grade appendiceal cancer.  She went up to see her Psychologist, sport and exercise at [facility].  Dr. Sharon Mt was not convinced that there was a problem.  However, he is setting her up for a biopsy.  He is worried about her liver.  There was some cirrhosis on the CT scan.  She is going to see a liver specialist up in [place].  I think she goes up there in a week or 2.  She has had no change in bowel or bladder habits.  There has been no bleeding.  However, she said that her stool was heme positive.  She has had no cough or shortness of breath.  She is on Xarelto when she travels.  She has had no rashes.  There has been no weight loss or weight gain.  Currently, her performance status is ECOG 1.    Medications:  Current Outpatient Medications:  .  amLODipine (NORVASC) 5 MG tablet, Take 5 mg by mouth daily., Disp: , Rfl:  .  azaTHIOprine (IMURAN) 50 MG tablet, Take 50 mg by mouth in the morning, at noon, and at bedtime., Disp: , Rfl:  .  busPIRone (BUSPAR) 5 MG tablet, Take 5 mg by mouth 3 (three) times daily as needed (anxiety). , Disp: , Rfl:  .  Dulaglutide (TRULICITY)  2.42 PN/3.6RW SOPN, Inject 0.75 mg into the skin once a week. Tuesday, Disp: , Rfl:  .  insulin glargine (LANTUS) 100 UNIT/ML injection, Inject 0.1 mLs (10 Units total) into the skin at bedtime. Before bed (Patient taking differently: Inject 15 Units into the skin at bedtime. Before bed), Disp: 10 mL, Rfl: 11 .  insulin lispro (HUMALOG) 100 UNIT/ML injection, Inject 0.08 mLs (8 Units total) into the skin 3 (three) times daily before meals. (Patient taking differently: Inject 13 Units into the skin 3 (three) times daily before meals. ), Disp: 10 mL, Rfl: 11 .  metFORMIN (GLUCOPHAGE) 500 MG tablet, Take 500 mg by mouth 2 (two) times daily with a meal. , Disp: , Rfl:  .  naproxen (NAPRELAN) 500 MG 24 hr tablet, Take 2 tablets by mouth as needed., Disp: , Rfl:  .  omeprazole (PRILOSEC) 40 MG capsule, Take 40 mg by mouth at bedtime., Disp: , Rfl:  .  ondansetron (ZOFRAN) 8 MG tablet, Take 8 mg by mouth 3 (three) times daily., Disp: , Rfl:  .  oxyCODONE (OXY IR/ROXICODONE) 5 MG immediate release tablet, Take 5 mg by mouth 2 (two) times daily as needed., Disp: , Rfl:  .  promethazine (PHENERGAN) 12.5 MG tablet, Take 12.5 mg by mouth daily., Disp: , Rfl:  .  propranolol (INDERAL) 20 MG tablet, Take 20 mg by mouth 2 (two) times daily. , Disp: , Rfl:  .  sucralfate (CARAFATE) 1 g tablet, Take 1 g by mouth in the morning, at noon, and at bedtime. , Disp: , Rfl:  .  valsartan-hydrochlorothiazide (DIOVAN-HCT) 160-12.5 MG tablet, Take 1 tablet by mouth daily., Disp: , Rfl:  .  AZASAN 75 MG TABS, Take 2 tablets by mouth daily. (Patient not taking: Reported on 07/27/2020), Disp: , Rfl:  .  diazepam (VALIUM) 5 MG tablet, Take 40 minutes prior to MRI (Patient not taking: Reported on 07/27/2020), Disp: 1 tablet, Rfl: 0 .  furosemide (LASIX) 20 MG tablet, Take 20 mg by mouth daily as needed.  (Patient not taking: Reported on 07/27/2020), Disp: , Rfl:   Allergies:  Allergies  Allergen Reactions  . Codeine Shortness Of  Breath and Palpitations  . Penicillins Shortness Of Breath    Other reaction(s): Irregular Heart Rate, Other (See Comments) Rapid heartrate   . Alprazolam Hives and Other (See Comments)    Note: tolerates midazolam fine unresponsive Hard to arouse unresponsive Hard to arouse  Other reaction(s): Other (See Comments) unresponsive  . Ativan [Lorazepam] Other (See Comments)    Note: tolerates midazolam fine Face & Throat Swelling.  . Corticosteroids Other (See Comments)    Other reaction(s): Other (see comments) Psychotic behaivor Other reaction(s): Other (See Comments) Psychotic behaivor Psychotic behaivor Psychotic behaivor   . Erythromycin     Other reaction(s): Other (See Comments) Severe stomach pain   . Erythromycin Base Hives  . Milnacipran Hcl Other (See Comments)    Other reaction(s): Other (see comments) psychotic episode psychotic episode  Other reaction(s): Other (See Comments) psychotic episode  . Prednisone Other (See Comments)    Anxiety & Nervous Breakdown.  Ocie Cornfield  [Milnacipran]   . Povidone Iodine Rash    Had rash under bandage after surgery , has had betadine since without reaction   . Prednisolone Anxiety    Past Medical History, Surgical history, Social history, and Family History were reviewed and updated.  Review of Systems: Review of Systems  Constitutional: Negative.   HENT:  Negative.   Eyes: Negative.   Respiratory: Negative.   Cardiovascular: Negative.   Gastrointestinal: Negative.   Endocrine: Negative.   Genitourinary: Negative.    Musculoskeletal: Positive for back pain.  Skin: Negative.   Neurological: Negative.   Hematological: Negative.   Psychiatric/Behavioral: Negative.     Physical Exam:  weight is 248 lb 1.9 oz (112.5 kg). Her oral temperature is 98.4 F (36.9 C). Her blood pressure is 129/63 and her pulse is 66. Her respiration is 20 and oxygen saturation is 97%.   Wt Readings from Last 3 Encounters:  07/27/20  248 lb 1.9 oz (112.5 kg)  05/01/20 254 lb (115.2 kg)  03/19/20 256 lb 6.4 oz (116.3 kg)    Physical Exam Vitals reviewed.  HENT:     Head: Normocephalic and atraumatic.  Eyes:     Pupils: Pupils are equal, round, and reactive to light.  Cardiovascular:     Rate and Rhythm: Normal rate and regular rhythm.     Heart sounds: Normal heart sounds.  Pulmonary:     Effort: Pulmonary effort is normal.     Breath sounds: Normal breath sounds.  Abdominal:     General: Bowel sounds are normal.     Palpations: Abdomen is  soft.  Musculoskeletal:        General: No tenderness or deformity. Normal range of motion.     Cervical back: Normal range of motion.  Lymphadenopathy:     Cervical: No cervical adenopathy.  Skin:    General: Skin is warm and dry.     Findings: No erythema or rash.  Neurological:     Mental Status: She is alert and oriented to person, place, and time.  Psychiatric:        Behavior: Behavior normal.        Thought Content: Thought content normal.        Judgment: Judgment normal.      Lab Results  Component Value Date   WBC 6.7 07/27/2020   HGB 11.8 (L) 07/27/2020   HCT 35.6 (L) 07/27/2020   MCV 105.6 (H) 07/27/2020   PLT 319 07/27/2020     Chemistry      Component Value Date/Time   NA 143 07/27/2020 0922   K 4.1 07/27/2020 0922   CL 103 07/27/2020 0922   CO2 33 (H) 07/27/2020 0922   BUN 19 07/27/2020 0922   CREATININE 0.83 07/27/2020 0922      Component Value Date/Time   CALCIUM 10.4 (H) 07/27/2020 0922   ALKPHOS 119 07/27/2020 0922   AST 32 07/27/2020 0922   ALT 20 07/27/2020 0922   BILITOT 0.7 07/27/2020 0922      Impression and Plan: Ms. Pha is a very charming 59 year old white female.  She has incredible history.  I must says she probably has lived the longest with metastatic appendiceal carcinoma that I have seen.  Clearly I believe her chance of cure from the HIPEC procedure is over 95%.  I would be shocked if she had a recurrence  of her appendiceal carcinoma.  I realize that this initial 1 was low-grade.  I suppose it could take 15 years for her to recur.  She obviously is in good hands with Dr. Sharon Mt.  If there is recurrence, he would going and take it out.  I hope her back gets better.  I will not make an appointment for her until I know what is going on up in [place].  We will be more than happy to help out down here when she gets back.    I spent about 45 minutes with her today.   Volanda Napoleon, MD 10/4/202111:22 AM

## 2020-07-28 LAB — CANCER ANTIGEN 19-9: CA 19-9: 61 U/mL — ABNORMAL HIGH (ref 0–35)

## 2020-07-28 LAB — CA 125: Cancer Antigen (CA) 125: 5.6 U/mL (ref 0.0–38.1)

## 2020-07-30 ENCOUNTER — Encounter: Payer: Self-pay | Admitting: *Deleted

## 2020-08-07 ENCOUNTER — Other Ambulatory Visit: Payer: TRICARE For Life (TFL)

## 2020-08-07 ENCOUNTER — Ambulatory Visit: Payer: TRICARE For Life (TFL) | Admitting: Hematology & Oncology

## 2020-08-12 ENCOUNTER — Other Ambulatory Visit: Payer: Self-pay

## 2020-08-26 DIAGNOSIS — E1165 Type 2 diabetes mellitus with hyperglycemia: Secondary | ICD-10-CM | POA: Insufficient documentation

## 2020-10-20 ENCOUNTER — Other Ambulatory Visit: Payer: Self-pay | Admitting: Family

## 2020-10-20 ENCOUNTER — Encounter: Payer: Self-pay | Admitting: *Deleted

## 2020-10-20 DIAGNOSIS — C181 Malignant neoplasm of appendix: Secondary | ICD-10-CM

## 2020-10-20 DIAGNOSIS — C772 Secondary and unspecified malignant neoplasm of intra-abdominal lymph nodes: Secondary | ICD-10-CM

## 2020-10-24 HISTORY — PX: ABDOMINAL SURGERY: SHX537

## 2020-10-27 ENCOUNTER — Other Ambulatory Visit: Payer: Self-pay

## 2020-10-28 ENCOUNTER — Other Ambulatory Visit: Payer: Self-pay | Admitting: Family

## 2020-10-28 ENCOUNTER — Ambulatory Visit (HOSPITAL_BASED_OUTPATIENT_CLINIC_OR_DEPARTMENT_OTHER)
Admission: RE | Admit: 2020-10-28 | Discharge: 2020-10-28 | Disposition: A | Discharge status: Home / Self Care | Payer: TRICARE For Life (TFL) | Source: Ambulatory Visit | Attending: Family | Admitting: Family

## 2020-10-28 ENCOUNTER — Other Ambulatory Visit: Payer: Self-pay

## 2020-10-28 ENCOUNTER — Encounter (HOSPITAL_BASED_OUTPATIENT_CLINIC_OR_DEPARTMENT_OTHER): Payer: Self-pay

## 2020-10-28 ENCOUNTER — Inpatient Hospital Stay: Payer: Self-pay | Attending: Hematology & Oncology

## 2020-10-28 DIAGNOSIS — C772 Secondary and unspecified malignant neoplasm of intra-abdominal lymph nodes: Secondary | ICD-10-CM

## 2020-10-28 DIAGNOSIS — C181 Malignant neoplasm of appendix: Secondary | ICD-10-CM | POA: Insufficient documentation

## 2020-10-28 LAB — CMP (CANCER CENTER ONLY)
ALT: 28 U/L (ref 0–44)
AST: 35 U/L (ref 15–41)
Albumin: 3.8 g/dL (ref 3.5–5.0)
Alkaline Phosphatase: 117 U/L (ref 38–126)
Anion gap: 7 (ref 5–15)
BUN: 19 mg/dL (ref 6–20)
CO2: 30 mmol/L (ref 22–32)
Calcium: 10 mg/dL (ref 8.9–10.3)
Chloride: 103 mmol/L (ref 98–111)
Creatinine: 0.72 mg/dL (ref 0.44–1.00)
GFR, Estimated: >60 mL/min (ref >60)
Glucose, Bld: 118 mg/dL — ABNORMAL HIGH (ref 70–99)
Potassium: 4 mmol/L (ref 3.5–5.1)
Sodium: 140 mmol/L (ref 135–145)
Total Bilirubin: 0.5 mg/dL (ref 0.3–1.2)
Total Protein: 7.4 g/dL (ref 6.5–8.1)

## 2020-10-28 LAB — CBC WITH DIFFERENTIAL (CANCER CENTER ONLY)
Abs Immature Granulocytes: 0.02 10*3/uL (ref 0.00–0.07)
Basophils Absolute: 0.1 10*3/uL (ref 0.0–0.1)
Basophils Relative: 1 %
Eosinophils Absolute: 0.3 10*3/uL (ref 0.0–0.5)
Eosinophils Relative: 5 %
HCT: 37.3 % (ref 36.0–46.0)
Hemoglobin: 12.3 g/dL (ref 12.0–15.0)
Immature Granulocytes: 0 %
Lymphocytes Relative: 35 %
Lymphs Abs: 2.1 10*3/uL (ref 0.7–4.0)
MCH: 31.8 pg (ref 26.0–34.0)
MCHC: 33 g/dL (ref 30.0–36.0)
MCV: 96.4 fL (ref 80.0–100.0)
Monocytes Absolute: 0.7 10*3/uL (ref 0.1–1.0)
Monocytes Relative: 13 %
Neutro Abs: 2.7 10*3/uL (ref 1.7–7.7)
Neutrophils Relative %: 46 %
Platelet Count: 371 10*3/uL (ref 150–400)
RBC: 3.87 MIL/uL (ref 3.87–5.11)
RDW: 15 % (ref 11.5–15.5)
WBC Count: 5.8 10*3/uL (ref 4.0–10.5)
nRBC: 0.3 % — ABNORMAL HIGH (ref 0.0–0.2)

## 2020-10-28 MED ORDER — IOHEXOL 300 MG/ML  SOLN
100.0000 mL | Freq: Once | INTRAMUSCULAR | Status: AC | PRN
  Administered 2020-10-28: 100 mL via INTRAVENOUS

## 2020-10-29 ENCOUNTER — Telehealth: Payer: Self-pay | Admitting: *Deleted

## 2020-10-29 NOTE — Telephone Encounter (Signed)
-----   Message from [name], MD sent at 10/28/2020  5:14 PM EST ----- Call - the  CT scan shows that the cancer has come back only ion the mesenteric tissue that covers the organs in the belly.  The chest looks fine.  Hopefully, the surgeon can get this out and put the chemo into your belly.  When is surgery planned??  Cindee Lame

## 2020-10-29 NOTE — Telephone Encounter (Signed)
Unable to reach pt regarding results. LMOVM for pt to call clinic. " when is surgery date? " per MD

## 2021-01-12 ENCOUNTER — Telehealth: Payer: Self-pay | Admitting: Hematology & Oncology

## 2021-01-12 NOTE — Telephone Encounter (Signed)
Called and left detailed message regarding appointments scheduled per 3/22 staff message

## 2021-01-15 ENCOUNTER — Telehealth: Payer: Self-pay

## 2021-01-15 NOTE — Telephone Encounter (Signed)
Pt called in stating that she has just returned home from having major surg in [place] and is having very bad diarrhea.  The phy in [place] states she should have a cdiff stool test done.  Pt states she did a phone visit with her PCP this week and now they are not returning her calls and req to see if we can order this for her.  Wendy Barber

## 2021-01-16 ENCOUNTER — Other Ambulatory Visit: Payer: Self-pay

## 2021-01-16 ENCOUNTER — Emergency Department (HOSPITAL_COMMUNITY): Payer: Medicare Other

## 2021-01-16 ENCOUNTER — Inpatient Hospital Stay (HOSPITAL_COMMUNITY)
Admission: EM | Admit: 2021-01-16 | Discharge: 2021-02-10 | DRG: 862 | Disposition: A | Discharge status: Home Health | Payer: Medicare Other | Attending: Internal Medicine | Admitting: Internal Medicine

## 2021-01-16 ENCOUNTER — Encounter (HOSPITAL_COMMUNITY): Payer: Self-pay

## 2021-01-16 DIAGNOSIS — T8141XA Infection following a procedure, superficial incisional surgical site, initial encounter: Secondary | ICD-10-CM | POA: Diagnosis not present

## 2021-01-16 DIAGNOSIS — Z9221 Personal history of antineoplastic chemotherapy: Secondary | ICD-10-CM

## 2021-01-16 DIAGNOSIS — Z79899 Other long term (current) drug therapy: Secondary | ICD-10-CM | POA: Diagnosis not present

## 2021-01-16 DIAGNOSIS — I82491 Acute embolism and thrombosis of other specified deep vein of right lower extremity: Secondary | ICD-10-CM | POA: Diagnosis not present

## 2021-01-16 DIAGNOSIS — F419 Anxiety disorder, unspecified: Secondary | ICD-10-CM | POA: Diagnosis not present

## 2021-01-16 DIAGNOSIS — K651 Peritoneal abscess: Secondary | ICD-10-CM | POA: Diagnosis present

## 2021-01-16 DIAGNOSIS — K632 Fistula of intestine: Secondary | ICD-10-CM | POA: Diagnosis not present

## 2021-01-16 DIAGNOSIS — I951 Orthostatic hypotension: Secondary | ICD-10-CM | POA: Diagnosis not present

## 2021-01-16 DIAGNOSIS — Z8509 Personal history of malignant neoplasm of other digestive organs: Secondary | ICD-10-CM | POA: Diagnosis not present

## 2021-01-16 DIAGNOSIS — M797 Fibromyalgia: Secondary | ICD-10-CM | POA: Diagnosis present

## 2021-01-16 DIAGNOSIS — K754 Autoimmune hepatitis: Secondary | ICD-10-CM | POA: Diagnosis present

## 2021-01-16 DIAGNOSIS — I2699 Other pulmonary embolism without acute cor pulmonale: Secondary | ICD-10-CM

## 2021-01-16 DIAGNOSIS — G250 Essential tremor: Secondary | ICD-10-CM | POA: Diagnosis present

## 2021-01-16 DIAGNOSIS — Z86718 Personal history of other venous thrombosis and embolism: Secondary | ICD-10-CM

## 2021-01-16 DIAGNOSIS — F32A Depression, unspecified: Secondary | ICD-10-CM | POA: Diagnosis present

## 2021-01-16 DIAGNOSIS — Z85038 Personal history of other malignant neoplasm of large intestine: Secondary | ICD-10-CM

## 2021-01-16 DIAGNOSIS — C772 Secondary and unspecified malignant neoplasm of intra-abdominal lymph nodes: Secondary | ICD-10-CM | POA: Diagnosis not present

## 2021-01-16 DIAGNOSIS — T8149XD Infection following a procedure, other surgical site, subsequent encounter: Secondary | ICD-10-CM | POA: Diagnosis not present

## 2021-01-16 DIAGNOSIS — Z794 Long term (current) use of insulin: Secondary | ICD-10-CM

## 2021-01-16 DIAGNOSIS — Z9081 Acquired absence of spleen: Secondary | ICD-10-CM

## 2021-01-16 DIAGNOSIS — E669 Obesity, unspecified: Secondary | ICD-10-CM | POA: Diagnosis present

## 2021-01-16 DIAGNOSIS — R5382 Chronic fatigue, unspecified: Secondary | ICD-10-CM | POA: Diagnosis present

## 2021-01-16 DIAGNOSIS — E1169 Type 2 diabetes mellitus with other specified complication: Secondary | ICD-10-CM

## 2021-01-16 DIAGNOSIS — D75839 Thrombocytosis, unspecified: Secondary | ICD-10-CM | POA: Diagnosis present

## 2021-01-16 DIAGNOSIS — R109 Unspecified abdominal pain: Secondary | ICD-10-CM | POA: Diagnosis not present

## 2021-01-16 DIAGNOSIS — I10 Essential (primary) hypertension: Secondary | ICD-10-CM | POA: Diagnosis present

## 2021-01-16 DIAGNOSIS — T8131XA Disruption of external operation (surgical) wound, not elsewhere classified, initial encounter: Secondary | ICD-10-CM | POA: Diagnosis not present

## 2021-01-16 DIAGNOSIS — C181 Malignant neoplasm of appendix: Secondary | ICD-10-CM | POA: Diagnosis not present

## 2021-01-16 DIAGNOSIS — I82412 Acute embolism and thrombosis of left femoral vein: Secondary | ICD-10-CM

## 2021-01-16 DIAGNOSIS — Z888 Allergy status to other drugs, medicaments and biological substances status: Secondary | ICD-10-CM

## 2021-01-16 DIAGNOSIS — Z880 Allergy status to penicillin: Secondary | ICD-10-CM | POA: Diagnosis not present

## 2021-01-16 DIAGNOSIS — R609 Edema, unspecified: Secondary | ICD-10-CM | POA: Diagnosis not present

## 2021-01-16 DIAGNOSIS — L02211 Cutaneous abscess of abdominal wall: Secondary | ICD-10-CM

## 2021-01-16 DIAGNOSIS — Z20822 Contact with and (suspected) exposure to covid-19: Secondary | ICD-10-CM | POA: Diagnosis present

## 2021-01-16 DIAGNOSIS — A0472 Enterocolitis due to Clostridium difficile, not specified as recurrent: Secondary | ICD-10-CM

## 2021-01-16 DIAGNOSIS — G8929 Other chronic pain: Secondary | ICD-10-CM | POA: Diagnosis present

## 2021-01-16 DIAGNOSIS — E876 Hypokalemia: Secondary | ICD-10-CM

## 2021-01-16 DIAGNOSIS — Z978 Presence of other specified devices: Secondary | ICD-10-CM | POA: Diagnosis not present

## 2021-01-16 DIAGNOSIS — I82413 Acute embolism and thrombosis of femoral vein, bilateral: Secondary | ICD-10-CM | POA: Diagnosis not present

## 2021-01-16 DIAGNOSIS — K7460 Unspecified cirrhosis of liver: Secondary | ICD-10-CM

## 2021-01-16 DIAGNOSIS — B961 Klebsiella pneumoniae [K. pneumoniae] as the cause of diseases classified elsewhere: Secondary | ICD-10-CM | POA: Diagnosis present

## 2021-01-16 DIAGNOSIS — C786 Secondary malignant neoplasm of retroperitoneum and peritoneum: Secondary | ICD-10-CM

## 2021-01-16 DIAGNOSIS — Z95828 Presence of other vascular implants and grafts: Secondary | ICD-10-CM

## 2021-01-16 DIAGNOSIS — Z881 Allergy status to other antibiotic agents status: Secondary | ICD-10-CM

## 2021-01-16 DIAGNOSIS — I745 Embolism and thrombosis of iliac artery: Secondary | ICD-10-CM | POA: Diagnosis not present

## 2021-01-16 DIAGNOSIS — D509 Iron deficiency anemia, unspecified: Secondary | ICD-10-CM | POA: Diagnosis present

## 2021-01-16 DIAGNOSIS — Z86711 Personal history of pulmonary embolism: Secondary | ICD-10-CM

## 2021-01-16 DIAGNOSIS — Z9049 Acquired absence of other specified parts of digestive tract: Secondary | ICD-10-CM

## 2021-01-16 DIAGNOSIS — D500 Iron deficiency anemia secondary to blood loss (chronic): Secondary | ICD-10-CM

## 2021-01-16 DIAGNOSIS — Y838 Other surgical procedures as the cause of abnormal reaction of the patient, or of later complication, without mention of misadventure at the time of the procedure: Secondary | ICD-10-CM | POA: Diagnosis present

## 2021-01-16 DIAGNOSIS — E119 Type 2 diabetes mellitus without complications: Secondary | ICD-10-CM | POA: Diagnosis not present

## 2021-01-16 DIAGNOSIS — Z90710 Acquired absence of both cervix and uterus: Secondary | ICD-10-CM

## 2021-01-16 DIAGNOSIS — R197 Diarrhea, unspecified: Secondary | ICD-10-CM | POA: Diagnosis not present

## 2021-01-16 DIAGNOSIS — T8149XA Infection following a procedure, other surgical site, initial encounter: Secondary | ICD-10-CM | POA: Diagnosis not present

## 2021-01-16 DIAGNOSIS — T383X6A Underdosing of insulin and oral hypoglycemic [antidiabetic] drugs, initial encounter: Secondary | ICD-10-CM | POA: Diagnosis present

## 2021-01-16 DIAGNOSIS — Z6837 Body mass index (BMI) 37.0-37.9, adult: Secondary | ICD-10-CM

## 2021-01-16 DIAGNOSIS — Z885 Allergy status to narcotic agent status: Secondary | ICD-10-CM

## 2021-01-16 DIAGNOSIS — Z7984 Long term (current) use of oral hypoglycemic drugs: Secondary | ICD-10-CM

## 2021-01-16 LAB — GASTROINTESTINAL PANEL BY PCR, STOOL (REPLACES STOOL CULTURE)

## 2021-01-16 LAB — CBC WITH DIFFERENTIAL/PLATELET
Abs Immature Granulocytes: 0.04 10*3/uL (ref 0.00–0.07)
Basophils Absolute: 0.1 10*3/uL (ref 0.0–0.1)
Basophils Relative: 1 %
Eosinophils Absolute: 0.1 10*3/uL (ref 0.0–0.5)
Eosinophils Relative: 1 %
HCT: 32.2 % — ABNORMAL LOW (ref 36.0–46.0)
Hemoglobin: 10 g/dL — ABNORMAL LOW (ref 12.0–15.0)
Immature Granulocytes: 0 %
Lymphocytes Relative: 37 %
Lymphs Abs: 3.5 10*3/uL (ref 0.7–4.0)
MCH: 31 pg (ref 26.0–34.0)
MCHC: 31.1 g/dL (ref 30.0–36.0)
MCV: 99.7 fL (ref 80.0–100.0)
Monocytes Absolute: 0.9 10*3/uL (ref 0.1–1.0)
Monocytes Relative: 9 %
Neutro Abs: 4.8 10*3/uL (ref 1.7–7.7)
Neutrophils Relative %: 52 %
Platelets: 288 10*3/uL (ref 150–400)
RBC: 3.23 MIL/uL — ABNORMAL LOW (ref 3.87–5.11)
RDW: 21.2 % — ABNORMAL HIGH (ref 11.5–15.5)
WBC: 9.4 10*3/uL (ref 4.0–10.5)
nRBC: 0 % (ref 0.0–0.2)

## 2021-01-16 LAB — COMPREHENSIVE METABOLIC PANEL
ALT: 14 U/L (ref 0–44)
AST: 28 U/L (ref 15–41)
Albumin: 2.6 g/dL — ABNORMAL LOW (ref 3.5–5.0)
Alkaline Phosphatase: 185 U/L — ABNORMAL HIGH (ref 38–126)
Anion gap: 12 (ref 5–15)
BUN: 9 mg/dL (ref 6–20)
CO2: 25 mmol/L (ref 22–32)
Calcium: 8.8 mg/dL — ABNORMAL LOW (ref 8.9–10.3)
Chloride: 103 mmol/L (ref 98–111)
Creatinine, Ser: 0.39 mg/dL — ABNORMAL LOW (ref 0.44–1.00)
GFR, Estimated: >60 mL/min (ref >60)
Glucose, Bld: 122 mg/dL — ABNORMAL HIGH (ref 70–99)
Potassium: 2.9 mmol/L — ABNORMAL LOW (ref 3.5–5.1)
Sodium: 140 mmol/L (ref 135–145)
Total Bilirubin: 1.1 mg/dL (ref 0.3–1.2)
Total Protein: 7.3 g/dL (ref 6.5–8.1)

## 2021-01-16 LAB — RESP PANEL BY RT-PCR (FLU A&B, COVID) ARPGX2
Influenza A by PCR: NEGATIVE
Influenza B by PCR: NEGATIVE
SARS Coronavirus 2 by RT PCR: NEGATIVE

## 2021-01-16 LAB — C DIFFICILE QUICK SCREEN W PCR REFLEX
C Diff antigen: POSITIVE — AB
C Diff toxin: NEGATIVE

## 2021-01-16 LAB — CLOSTRIDIUM DIFFICILE BY PCR, REFLEXED: Toxigenic C. Difficile by PCR: POSITIVE — AB

## 2021-01-16 LAB — LIPASE, BLOOD: Lipase: 34 U/L (ref 11–51)

## 2021-01-16 LAB — LACTIC ACID, PLASMA: Lactic Acid, Venous: 1.8 mmol/L (ref 0.5–1.9)

## 2021-01-16 LAB — MAGNESIUM: Magnesium: 1.9 mg/dL (ref 1.7–2.4)

## 2021-01-16 MED ORDER — SODIUM CHLORIDE 0.9% FLUSH
3.0000 mL | Freq: Two times a day (BID) | INTRAVENOUS | Status: DC
  Administered 2021-01-16 – 2021-02-10 (×40): 3 mL via INTRAVENOUS

## 2021-01-16 MED ORDER — SODIUM CHLORIDE 0.9 % IV SOLN
2.0000 g | Freq: Three times a day (TID) | INTRAVENOUS | Status: DC
  Administered 2021-01-16 – 2021-01-17 (×3): 2 g via INTRAVENOUS
  Filled 2021-01-16 (×4): qty 2

## 2021-01-16 MED ORDER — ENOXAPARIN SODIUM 40 MG/0.4ML ~~LOC~~ SOLN
40.0000 mg | SUBCUTANEOUS | Status: DC
  Administered 2021-01-17 – 2021-01-31 (×15): 40 mg via SUBCUTANEOUS
  Filled 2021-01-16 (×16): qty 0.4

## 2021-01-16 MED ORDER — ONDANSETRON HCL 4 MG/2ML IJ SOLN
4.0000 mg | Freq: Four times a day (QID) | INTRAMUSCULAR | Status: DC | PRN
  Administered 2021-01-17 – 2021-02-08 (×32): 4 mg via INTRAVENOUS
  Filled 2021-01-16 (×34): qty 2

## 2021-01-16 MED ORDER — PANTOPRAZOLE SODIUM 40 MG PO TBEC
40.0000 mg | DELAYED_RELEASE_TABLET | Freq: Every morning | ORAL | Status: DC
  Administered 2021-01-17 – 2021-01-24 (×8): 40 mg via ORAL
  Filled 2021-01-16 (×5): qty 1

## 2021-01-16 MED ORDER — BUSPIRONE HCL 5 MG PO TABS
5.0000 mg | ORAL_TABLET | Freq: Every day | ORAL | Status: DC
  Administered 2021-01-17 – 2021-02-09 (×23): 5 mg via ORAL
  Filled 2021-01-16 (×23): qty 1

## 2021-01-16 MED ORDER — METRONIDAZOLE IN NACL 5-0.79 MG/ML-% IV SOLN
500.0000 mg | Freq: Three times a day (TID) | INTRAVENOUS | Status: DC
  Administered 2021-01-16 – 2021-01-17 (×2): 500 mg via INTRAVENOUS
  Filled 2021-01-16 (×2): qty 100

## 2021-01-16 MED ORDER — VANCOMYCIN 50 MG/ML ORAL SOLUTION
125.0000 mg | Freq: Four times a day (QID) | ORAL | Status: DC
  Filled 2021-01-16 (×2): qty 2.5

## 2021-01-16 MED ORDER — BUSPIRONE HCL 5 MG PO TABS
10.0000 mg | ORAL_TABLET | Freq: Every day | ORAL | Status: DC
  Administered 2021-01-16 – 2021-02-09 (×25): 10 mg via ORAL
  Filled 2021-01-16 (×25): qty 2

## 2021-01-16 MED ORDER — FENTANYL CITRATE (PF) 100 MCG/2ML IJ SOLN
50.0000 ug | Freq: Once | INTRAMUSCULAR | Status: AC
Start: 2021-01-16 — End: 2021-01-16
  Administered 2021-01-16: 50 ug via INTRAVENOUS
  Filled 2021-01-16: qty 2

## 2021-01-16 MED ORDER — VANCOMYCIN HCL IN DEXTROSE 1-5 GM/200ML-% IV SOLN
1000.0000 mg | Freq: Once | INTRAVENOUS | Status: AC
  Administered 2021-01-16: 1000 mg via INTRAVENOUS
  Filled 2021-01-16: qty 200

## 2021-01-16 MED ORDER — POTASSIUM CHLORIDE CRYS ER 20 MEQ PO TBCR
40.0000 meq | EXTENDED_RELEASE_TABLET | Freq: Once | ORAL | Status: AC
  Administered 2021-01-16: 40 meq via ORAL
  Filled 2021-01-16: qty 2

## 2021-01-16 MED ORDER — HYDROMORPHONE HCL 1 MG/ML IJ SOLN
0.5000 mg | Freq: Once | INTRAMUSCULAR | Status: AC
  Administered 2021-01-16: 0.5 mg via INTRAVENOUS
  Filled 2021-01-16: qty 1

## 2021-01-16 MED ORDER — HYDROMORPHONE HCL 1 MG/ML IJ SOLN
1.0000 mg | Freq: Once | INTRAMUSCULAR | Status: AC
  Administered 2021-01-16: 1 mg via INTRAVENOUS
  Filled 2021-01-16: qty 1

## 2021-01-16 MED ORDER — HYDROCODONE-ACETAMINOPHEN 5-325 MG PO TABS
1.0000 | ORAL_TABLET | ORAL | Status: DC | PRN
  Administered 2021-01-16 – 2021-01-17 (×3): 1 via ORAL
  Filled 2021-01-16 (×4): qty 1

## 2021-01-16 MED ORDER — ONDANSETRON HCL 4 MG PO TABS
4.0000 mg | ORAL_TABLET | Freq: Four times a day (QID) | ORAL | Status: DC | PRN
  Administered 2021-01-20 – 2021-02-09 (×4): 4 mg via ORAL
  Filled 2021-01-16 (×5): qty 1

## 2021-01-16 MED ORDER — ACETAMINOPHEN 650 MG RE SUPP: 650.0000 mg | Freq: Four times a day (QID) | RECTAL | Status: DC | PRN

## 2021-01-16 MED ORDER — HYDRALAZINE HCL 20 MG/ML IJ SOLN
10.0000 mg | Freq: Three times a day (TID) | INTRAMUSCULAR | Status: DC | PRN
  Administered 2021-01-18: 10 mg via INTRAVENOUS
  Filled 2021-01-16: qty 1

## 2021-01-16 MED ORDER — POTASSIUM CHLORIDE 10 MEQ/100ML IV SOLN
10.0000 meq | INTRAVENOUS | Status: AC
  Administered 2021-01-16 (×2): 10 meq via INTRAVENOUS
  Filled 2021-01-16 (×2): qty 100

## 2021-01-16 MED ORDER — INSULIN GLARGINE 100 UNIT/ML ~~LOC~~ SOLN: 5.0000 [IU] | Freq: Every day | SUBCUTANEOUS | Status: DC

## 2021-01-16 MED ORDER — LACTATED RINGERS IV SOLN
INTRAVENOUS | Status: AC
  Administered 2021-01-17: 100 mL/h via INTRAVENOUS
  Administered 2021-01-17: 100 mL via INTRAVENOUS

## 2021-01-16 MED ORDER — IOHEXOL 300 MG/ML  SOLN
100.0000 mL | Freq: Once | INTRAMUSCULAR | Status: AC | PRN
  Administered 2021-01-16: 100 mL via INTRAVENOUS

## 2021-01-16 MED ORDER — ONDANSETRON HCL 4 MG/2ML IJ SOLN
4.0000 mg | Freq: Once | INTRAMUSCULAR | Status: AC
  Administered 2021-01-16: 4 mg via INTRAVENOUS
  Filled 2021-01-16: qty 2

## 2021-01-16 MED ORDER — ACETAMINOPHEN 325 MG PO TABS: 650.0000 mg | ORAL_TABLET | Freq: Four times a day (QID) | ORAL | Status: DC | PRN

## 2021-01-16 MED ORDER — BUSPIRONE HCL 5 MG PO TABS
5.0000 mg | ORAL_TABLET | Freq: Every day | ORAL | Status: DC
  Administered 2021-01-17 – 2021-02-10 (×25): 5 mg via ORAL
  Filled 2021-01-16 (×25): qty 1

## 2021-01-16 MED ORDER — BUSPIRONE HCL 10 MG PO TABS: 5.0000 mg | ORAL_TABLET | ORAL | Status: DC

## 2021-01-16 MED ORDER — HYDROMORPHONE HCL 1 MG/ML IJ SOLN
0.5000 mg | INTRAMUSCULAR | Status: DC | PRN
  Administered 2021-01-17: 0.5 mg via INTRAVENOUS
  Filled 2021-01-16: qty 0.5

## 2021-01-16 MED ORDER — SODIUM CHLORIDE 0.9 % IV BOLUS
1000.0000 mL | Freq: Once | INTRAVENOUS | Status: AC
  Administered 2021-01-16: 1000 mL via INTRAVENOUS

## 2021-01-16 MED ORDER — METRONIDAZOLE IN NACL 5-0.79 MG/ML-% IV SOLN
500.0000 mg | Freq: Once | INTRAVENOUS | Status: AC
  Administered 2021-01-16: 500 mg via INTRAVENOUS
  Filled 2021-01-16: qty 100

## 2021-01-16 MED ORDER — VANCOMYCIN 50 MG/ML ORAL SOLUTION
125.0000 mg | Freq: Four times a day (QID) | ORAL | Status: DC
  Administered 2021-01-16 – 2021-01-22 (×25): 125 mg via ORAL
  Filled 2021-01-16 (×27): qty 2.5

## 2021-01-16 MED ORDER — VANCOMYCIN HCL 1500 MG/300ML IV SOLN
1500.0000 mg | INTRAVENOUS | Status: DC
  Filled 2021-01-16: qty 300

## 2021-01-16 NOTE — H&P (Signed)
History and Physical        Hospital Admission Note Date: 01/16/2021  Patient name: Wendy Barber Medical record number: [id number] Date of birth: Jul 28, 1961 Age: 60 y.o. Gender: female  PCP: Glendon Axe, MD    Chief Complaint    Chief Complaint  Patient presents with  . Diarrhea  . Wound Check      HPI:   This is a 60 year old female with a past medical history of metastatic appendiceal cancer, pseudomyxoma peritonei and peritoneal carcinomatosis s/p CRS/HIPEC and splenectomy in 2005 and recently CRS/HIPEC on 12/10/2020 for recurrent cancer, autoimmune hepatitis, DVT and PE with IVC filter currently on Lovenox, hypertension, diabetes who presented to the ED with multiple complaints including intractable diarrhea since her procedure, fatigue and wound drainage this a.m. Since her hospitalization in February she has had persistent diarrhea but tested negative for C. difficile and was started on Imodium and Lomotil with some improvement.  She was recently seen by her oncologist in [place], [place] on 3/16 and patient states that she started antibiotics at that time but is unsure what medication.  Says that her diarrhea has since worsened. Yesterday she started feeling very fatigued with decreased appetite and increasing pain and this morning she went to the bathroom and had an area of her incision open with a significant amount of purulent foul-smelling drainage.  She denies any fever, chills, chest pain.  Has chronic nausea which she takes Zofran multiple times daily for.  Of note, patient reports that she is up-to-date with her vaccines in regards to her splenectomy.  Regarding her recent surgery: She is s/p cytoreductive surgery/HIPEC 12/10/2020 at [facility] with major reduction of small bowel, proximal jejunum, ascending colon, partial cystectomy.   Postoperative course was notable for anastomotic leak and she was taken back to the OR on POD #10 and closure of abdominal wound on POD #13.  She had a JP drain which was recently removed on 3/16.   ED Course: Afebrile, hypertensive, on room air. Notable Labs: Sodium 140, K2.9, glucose 122, BUN 9, creatinine 0.39, alk phos 185, lipase 34, AST 20, ALT 14, lactic acid 1.8, WBC 9.4, Hb 10.0, C. difficile positive, COVID-19 pending. Notable Imaging: CT abdomen pelvis with contrast-gas and fluid collection within the anterior abdominal wall ventral surgical wound measuring 3.2 x 1.8 x 13 cm concerning for wound infection, additional loculated fluid collection in the pelvis superior and anterior to the urinary bladder measuring 5 x 5 x 2 0.6 x 3.8 cm possible sterile versus infected postoperative collection, scattered infiltrative changes of mesenteric and abdominal fat either related to surgery though cannot exclude involvement of peritoneal tumor, diffuse colonic and small bowel wall thickening which could be related to surgery though enteritis is not excluded, possible cirrhosis. Patient received fentanyl, Dilaudid, Zofran, KCl p.o, vancomycin IV, metronidazole, 2 L NS bolus.  General surgery was consulted by the ED provider   Vitals:   01/16/21 1600 01/16/21 1651  BP: (!) 164/95 (!) 157/89  Pulse: 78 77  Resp: 18 (!) 21  Temp:  98.4 F (36.9 C)  SpO2: 93% 95%     Review of Systems:  Review of Systems  All other systems reviewed and  are negative.   Medical/Social/Family History   Past Medical History: Past Medical History:  Diagnosis Date  . Arthritis   . Back pain   . Cancer [facility])    pseudomyxoma peritonei  . 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 Chronic fatigue syndrome   . Diabetes mellitus without complication ([place])   . DVT of deep femoral vein, left ([place]) 03/19/2020  . Fibromyalgia   . Goals of care, counseling/discussion 03/19/2020  .  Hypertension   . Malignant pseudomyxoma peritonei ([place]) 03/19/2020  . Presence of IVC filter 03/19/2020  . Pulmonary embolism, bilateral ([place]) 03/19/2020    Past Surgical History:  Procedure Laterality Date  . ABDOMINAL HYSTERECTOMY    . ABDOMINAL SURGERY    . ACHILLES TENDON REPAIR    . APPENDECTOMY    . ARTHROPLASTY    . CARPAL TUNNEL RELEASE    . CHOLECYSTECTOMY    . IR US GUIDE BX ASP/DRAIN  11/25/2019  . JOINT REPLACEMENT    . KNEE ARTHROSCOPY    . ORIF ANKLE FRACTURE Right 08/27/2019   Procedure: OPEN REDUCTION INTERNAL FIXATION RIGHT ANKLE FRACTURE;  Surgeon: Renette Butters, MD;  Location: [facility] ORS;  Service: Orthopedics;  Laterality: Right;  . perineorrophy    . TONGUE BIOPSY      Medications: Prior to Admission medications   Medication Sig Start Date End Date Taking? Authorizing Provider  amLODipine (NORVASC) 5 MG tablet Take 5 mg by mouth daily. 01/12/20   [provider]  AZASAN 75 MG TABS Take 2 tablets by mouth daily. Patient not taking: Reported on 07/27/2020 07/10/20   [provider]  azaTHIOprine (IMURAN) 50 MG tablet Take 50 mg by mouth in the morning, at noon, and at bedtime. 04/29/20   [provider]  busPIRone (BUSPAR) 5 MG tablet Take 5 mg by mouth 3 (three) times daily as needed (anxiety).     [provider]  diazepam (VALIUM) 5 MG tablet Take 40 minutes prior to MRI Patient not taking: Reported on 07/27/2020 07/16/20   Volanda Napoleon, MD  Dulaglutide (TRULICITY) 7.59 FM/3.8GY SOPN Inject 0.75 mg into the skin once a week. Tuesday    [provider]  furosemide (LASIX) 20 MG tablet Take 20 mg by mouth daily as needed.  Patient not taking: Reported on 07/27/2020 01/22/20   [provider]  insulin glargine (LANTUS) 100 UNIT/ML injection Inject 0.1 mLs (10 Units total) into the skin at bedtime. Before bed Patient taking differently: Inject 15 Units into the skin at bedtime. Before bed 11/26/19   Nita Sells,  MD  insulin lispro (HUMALOG) 100 UNIT/ML injection Inject 0.08 mLs (8 Units total) into the skin 3 (three) times daily before meals. Patient taking differently: Inject 13 Units into the skin 3 (three) times daily before meals.  11/26/19   Nita Sells, MD  metFORMIN (GLUCOPHAGE) 500 MG tablet Take 500 mg by mouth 2 (two) times daily with a meal.     [provider]  naproxen (NAPRELAN) 500 MG 24 hr tablet Take 2 tablets by mouth as needed. 04/09/09   [provider]  omeprazole (PRILOSEC) 40 MG capsule Take 40 mg by mouth at bedtime.    [provider]  ondansetron (ZOFRAN) 8 MG tablet Take 8 mg by mouth 3 (three) times daily. 02/26/20   [provider]  oxyCODONE (OXY IR/ROXICODONE) 5 MG immediate release tablet Take 5 mg by mouth 2 (two) times daily as needed. 02/14/20  [provider]  promethazine (PHENERGAN) 12.5 MG tablet Take 12.5 mg by mouth daily. 04/22/20   [provider]  propranolol (INDERAL) 20 MG tablet Take 20 mg by mouth 2 (two) times daily.     [provider]  sucralfate (CARAFATE) 1 g tablet Take 1 g by mouth in the morning, at noon, and at bedtime.  02/14/20   [provider]  valsartan-hydrochlorothiazide (DIOVAN-HCT) 160-12.5 MG tablet Take 1 tablet by mouth daily. 03/20/20   [provider]    Allergies:   Allergies  Allergen Reactions  . Penicillins Shortness Of Breath    Other reaction(s): Irregular Heart Rate, Other (See Comments) Rapid heartrate   . Alprazolam Hives and Other (See Comments)    Note: tolerates midazolam fine unresponsive Hard to arouse unresponsive Hard to arouse  Other reaction(s): Other (See Comments) unresponsive  . Ativan [Lorazepam] Other (See Comments)    Note: tolerates midazolam fine Face & Throat Swelling.  . Corticosteroids Other (See Comments)    Other reaction(s): Other (see comments) Psychotic behaviour    . Erythromycin     Other  reaction(s): Other (See Comments) Severe stomach pain   . Erythromycin Base Hives  . Milnacipran Hcl Other (See Comments)    Other reaction(s):  psychotic episode     . Prednisone Other (See Comments)    Anxiety & Nervous Breakdown.  Ocie Cornfield  [Milnacipran]   . Povidone Iodine Rash    Had rash under bandage after surgery , has had betadine since without reaction   . Prednisolone Anxiety    Social History:  reports that she has never smoked. She has never used smokeless tobacco. She reports that she does not drink alcohol and does not use drugs.  Family History: History reviewed. No pertinent family history.   Objective   Physical Exam: Blood pressure (!) 157/89, pulse 77, temperature 98.4 F (36.9 C), temperature source Oral, resp. rate (!) 21, height 5' 6"  (1.676 m), weight 102.1 kg, SpO2 95 %.  Physical Exam Vitals and nursing note reviewed.  Constitutional:      Appearance: She is ill-appearing.  HENT:     Head: Normocephalic.     Mouth/Throat:     Mouth: Mucous membranes are dry.  Eyes:     Conjunctiva/sclera: Conjunctivae normal.  Cardiovascular:     Rate and Rhythm: Normal rate and regular rhythm.     Heart sounds: No murmur heard.   Pulmonary:     Effort: Pulmonary effort is normal.     Breath sounds: Normal breath sounds.  Abdominal:     General: Abdomen is flat. A surgical scar is present.     Comments: Purulent drainage from incision site, see picture below  Musculoskeletal:        General: No swelling or tenderness.  Skin:    General: Skin is warm.     Coloration: Skin is not jaundiced.  Neurological:     Mental Status: She is alert. Mental status is at baseline.  Psychiatric:        Mood and Affect: Mood normal.        Behavior: Behavior normal.       LABS on Admission: I have personally reviewed all the labs and imaging below    Basic Metabolic Panel: Recent Labs  Lab 01/16/21 1043 01/16/21 1420  NA 140  --   K 2.9*  --   CL  103  --   CO2 25  --   GLUCOSE 122*  --  BUN 9  --   CREATININE 0.39*  --   CALCIUM 8.8*  --   MG  --  1.9   Liver Function Tests: Recent Labs  Lab 01/16/21 1043  AST 28  ALT 14  ALKPHOS 185*  BILITOT 1.1  PROT 7.3  ALBUMIN 2.6*   Recent Labs  Lab 01/16/21 1043  LIPASE 34   No results for input(s): AMMONIA in the last 168 hours. CBC: Recent Labs  Lab 01/16/21 1420  WBC 9.4  NEUTROABS 4.8  HGB 10.0*  HCT 32.2*  MCV 99.7  PLT 288   Cardiac Enzymes: No results for input(s): CKTOTAL, CKMB, CKMBINDEX, TROPONINI in the last 168 hours. BNP: Invalid input(s): POCBNP CBG: No results for input(s): GLUCAP in the last 168 hours.  Radiological Exams on Admission:  CT ABDOMEN PELVIS W CONTRAST  Result Date: 01/16/2021 CLINICAL DATA:  Surgery 5 weeks ago. Nausea, vomiting, and diarrhea for 6 weeks, worsened this morning. Incisional drainage of yellow fluid starting this morning. Appendiceal cancer with pseudomyxoma peritonei, chemotherapy, pulmonary embolism post IVC filter placement, fibromyalgia EXAM: CT ABDOMEN AND PELVIS WITH CONTRAST TECHNIQUE: Multidetector CT imaging of the abdomen and pelvis was performed using the standard protocol following bolus administration of intravenous contrast. Sagittal and coronal MPR images reconstructed from axial data set. CONTRAST:  178m OMNIPAQUE IOHEXOL 300 MG/ML SOLN IV. No oral contrast administered. COMPARISON:  10/28/2020 FINDINGS: Lower chest: Subsegmental atelectasis versus scarring lateral RIGHT lower lobe unchanged. Small posterior LEFT diaphragmatic hernia containing fat. Hepatobiliary: Nodular contour of the liver without discrete mass. Post cholecystectomy. Pancreas: Atrophic pancreas Spleen: Post splenectomy Adrenals/Urinary Tract: Unremarkable adrenal glands. Kidneys, ureters, and bladder normal appearance Stomach/Bowel: Anastomotic staple line at rectum within additional anastomotic staple lines in the RIGHT pelvis and central  mid abdomen. Diffuse colonic wall thickening. Stomach decompressed. Small bowel loops demonstrate mild scattered wall thickening as well. No evidence of obstruction. Vascular/Lymphatic: IVC filter, with penetration of the IVC wall by multiple limbs. Vascular structures patent. No adenopathy. Reproductive: Uterus surgically absent.  Ovaries nonvisualized. Other: Scattered infiltrative changes of mesenteric and abdominal fat which may be related to interval surgery, though cannot exclude involvement by peritoneal tumor. Gas and fluid collection identified within anterior abdominal wall ventral surgical wound measuring 3.2 x 1.8 cm in greatest axial dimensions and extending 13 cm in length, concerning for wound infection. Additional loculated fluid collection in the pelvis just superior and anterior to the urinary bladder measuring 5.5 x 2.6 x 3.8 cm, partially enhancing margins, sterile versus infected postoperative collection. Small amount of free air at anterior abdomen extending into the surgical wound. Musculoskeletal: Prior lumbosacral fusion. Scattered degenerative changes of thoracic and lumbar spine. IMPRESSION: Gas and fluid collection within the anterior abdominal wall ventral surgical wound measuring 3.2 x 1.8 x 13 cmconcerning for wound infection. Additional loculated fluid collection in the pelvis just superior and anterior to the urinary bladder measuring 5.5 x 2.6 x 3.8 cm, question sterile versus infected postoperative collection. Scattered infiltrative changes of mesenteric and abdominal fat which may be related to interval surgery, though cannot exclude involvement by peritoneal tumor. Small amount of free air extending into the surgical wound. Diffuse colonic and small bowel wall thickening which may be related to recent abdominal/intestinal surgery though enteritis not excluded Nodular contour of the liver question cirrhosis. Small posterior LEFT diaphragmatic hernia containing fat. Aortic  Atherosclerosis (ICD10-I70.0). Electronically Signed   By: MLavonia DanaM.D.   On: 01/16/2021 13:40      EKG: EKG  reports limb lead reversal   A & P   Principal Problem:   Surgical wound infection Active Problems:   Autoimmune hepatitis ([place])   DM2 (diabetes mellitus, type 2) ([place])   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)   DVT of deep femoral vein, left ([facility])   Pulmonary embolism, bilateral ([facility])   Presence of IVC filter   C. difficile enteritis   1. Abdominal post surgical wound infection a. Afebrile and hemodynamically stable on room air without leukocytosis   b. s/p cytoreductive surgery/HIPEC 12/10/2020 at [facility] in [place], [place] with major reduction of small bowel, proximal jejunum, ascending colon, partial cystectomy.  Postoperative course was notable for anastomotic leak and she was taken back to the OR on POD #10 and closure of abdominal wound on POD #13.  She had a JP drain which was recently removed on 3/16. c. CT reveals: Anterior abdominal wall ventral surgical wound collection three 3.2 x 1.8 x 13 cm concerning for infection as well as additional loculated fluid collection in the pelvis superior and anterior to the urinary bladder measuring 5.5 x 2.6 x 3.8 cm d. Patient is asplenic ->broad-spectrum antibiotics: Vancomycin, cefepime, metronidazole e. Continue pain management f. IV fluids g. WOCN h. General surgery consulted  2. C. difficile enteritis a. P.o. vancomycin b. Hold home Imodium and Lomotil  3. Metastatic appendiceal cancer, pseudomyxoma peritonei and peritoneal carcinomatosis s/p CRS/HIPEC and splenectomy in 2005 and recently CRS/HIPEC on 12/10/2020 for recurrent cancer a. Will notify oncology   4. History of DVT and PE s/p IVC filter a. Continue home prophylactic Lovenox  5. Hypertension a. Not on medications  6. Anxiety a. Continue home BuSpar  7. Type 2 diabetes a. Glucose 122 and has not been taking  her insulin at home b. Check HbA1c c. Control with diet for now  8. Autoimmune hepatitis a. Holding home azathioprine  9. Hypokalemia a. Likely from GI losses b. will give an additional 40 mEq KCl p.o.     DVT prophylaxis: Lovenox   Code Status: Full Code  Diet: Heart healthy carb modified Family Communication: Admission, patients condition and plan of care including tests being ordered have been discussed with the patient who indicates understanding and agrees with the plan and Code Status.  Disposition Plan: The appropriate patient status for this patient is INPATIENT. Inpatient status is judged to be reasonable and necessary in order to provide the required intensity of service to ensure the patient's safety. The patient's presenting symptoms, physical exam findings, and initial radiographic and laboratory data in the context of their chronic comorbidities is felt to place them at high risk for further clinical deterioration. Furthermore, it is not anticipated that the patient will be medically stable for discharge from the hospital within 2 midnights of admission. The following factors support the patient status of inpatient.   " The patient's presenting symptoms include diarrhea, surgical wound drainage. " The worrisome physical exam findings include surgical wound purulent drainage, dry mucous membranes " The initial radiographic and laboratory data are worrisome because of as above. " The chronic co-morbidities include as above.   * I certify that at the point of admission it is my clinical judgment that the patient will require inpatient hospital care spanning beyond 2 midnights from the point of admission due to high intensity of service, high risk for further deterioration and high frequency of surveillance required.*   The medical decision making on this patient was of high complexity and the  patient is at high risk for clinical deterioration, therefore this is a level 3   admission.  Consultants  . General surgery . Oncology  Procedures  . None  Time Spent on Admission: 73 minutes    Harold Hedge, DO [facility]  01/16/2021, 5:41 PM

## 2021-01-16 NOTE — Consult Note (Addendum)
CC: abdominal wall wound/drainage and pelvic fluid collection  Requesting provider: Dr. 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  Her original surgery was approximately 17 years ago.  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  She underwent extensive surgery in [place] starting in February with intra-abdominal debulking and chemotherapy.  She had to return to the operating for perforation.  Her midline wound was left open and then was later closed.  She has been home from [place] for a little over a week.  She has been having increasing fatigue and diarrhea.  She has had mild abdominal pain.  She has been found to be C. difficile positive.  She has had drainage from her midline wound since surgery including an area in the upper abdomen which she now reports is completely closed and in the lower abdomen which is been serosanguineous.  She denies fevers or chills.  Past Medical History:  Diagnosis Date  . Arthritis   . Back pain   . Cancer [facility])    pseudomyxoma peritonei  . 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[place]) 03/19/2020  . Chronic fatigue syndrome   . Diabetes mellitus without complication ([place])   . DVT of deep femoral vein, left ([place]) 03/19/2020  . Fibromyalgia   . Goals of care, counseling/discussion 03/19/2020  . Hypertension   . Malignant pseudomyxoma peritonei ([place]) 03/19/2020  . Presence of IVC filter 03/19/2020  . Pulmonary embolism, bilateral ([place]) 03/19/2020    Past Surgical History:  Procedure Laterality Date  . ABDOMINAL HYSTERECTOMY    . ABDOMINAL SURGERY    . ACHILLES TENDON REPAIR    . APPENDECTOMY    . ARTHROPLASTY    . CARPAL TUNNEL RELEASE    . CHOLECYSTECTOMY    . IR US GUIDE BX ASP/DRAIN  11/25/2019  . JOINT REPLACEMENT    . KNEE ARTHROSCOPY    . ORIF ANKLE FRACTURE Right 08/27/2019   Procedure: OPEN REDUCTION INTERNAL FIXATION RIGHT  ANKLE FRACTURE;  Surgeon: Renette Butters, MD;  Location: [facility];  Service: Orthopedics;  Laterality: Right;  . perineorrophy    . TONGUE BIOPSY      History reviewed. No pertinent family history.  Social:  reports that she has never smoked. She has never used smokeless tobacco. She reports that she does not drink alcohol and does not use drugs.  Allergies:  Allergies  Allergen Reactions  . Penicillins Shortness Of Breath    Other reaction(s): Irregular Heart Rate, Other (See Comments) Rapid heartrate   . Alprazolam Hives and Other (See Comments)    Note: tolerates midazolam fine unresponsive Hard to arouse unresponsive Hard to arouse  Other reaction(s): Other (See Comments) unresponsive  . Ativan [Lorazepam] Other (See Comments)    Note: tolerates midazolam fine Face & Throat Swelling.  . Corticosteroids Other (See Comments)    Other reaction(s): Other (see comments) Psychotic behaviour    . Erythromycin     Other reaction(s): Other (See Comments) Severe stomach pain   . Erythromycin Base Hives  . Milnacipran Hcl Other (See Comments)    Other reaction(s):  psychotic episode     . Prednisone Other (See Comments)    Anxiety & Nervous Breakdown.  Ocie Cornfield  [Milnacipran]   . Povidone Iodine Rash    Had rash under bandage after surgery , has had betadine since without reaction   . Prednisolone Anxiety    Medications: I have reviewed the  patient's current medications.  Results for orders placed or performed during the hospital encounter of 01/16/21 (from the past 48 hour(s))  Comprehensive metabolic panel     Status: Abnormal   Collection Time: 01/16/21 10:43 AM  Result Value Ref Range   Sodium 140 135 - 145 mmol/L   Potassium 2.9 (L) 3.5 - 5.1 mmol/L   Chloride 103 98 - 111 mmol/L   CO2 25 22 - 32 mmol/L   Glucose, Bld 122 (H) 70 - 99 mg/dL    Comment: Glucose reference range applies only to samples taken after fasting for at least 8 hours.   BUN 9 6 -  20 mg/dL   Creatinine, Ser 0.39 (L) 0.44 - 1.00 mg/dL   Calcium 8.8 (L) 8.9 - 10.3 mg/dL   Total Protein 7.3 6.5 - 8.1 g/dL   Albumin 2.6 (L) 3.5 - 5.0 g/dL   AST 28 15 - 41 U/L   ALT 14 0 - 44 U/L   Alkaline Phosphatase 185 (H) 38 - 126 U/L   Total Bilirubin 1.1 0.3 - 1.2 mg/dL   GFR, Estimated >60 >60 mL/min    Comment: (NOTE) Calculated using the CKD-EPI Creatinine Equation (2021)    Anion gap 12 5 - 15    Comment: Performed at [facility], [place] [address], [place], [place] [address]  Lipase, blood     Status: None   Collection Time: 01/16/21 10:43 AM  Result Value Ref Range   Lipase 34 11 - 51 U/L    Comment: Performed at [facility], [place] [address] [place], [place] [address]  Lactic acid, plasma     Status: None   Collection Time: 01/16/21 11:14 AM  Result Value Ref Range   Lactic Acid, Venous 1.8 0.5 - 1.9 mmol/L    Comment: Performed at [facility], [place] [address] [place], [place] [address]  C Difficile Quick Screen w PCR reflex     Status: Abnormal   Collection Time: 01/16/21 11:28 AM   Specimen: Stool  Result Value Ref Range   C Diff antigen POSITIVE (A) NEGATIVE   C Diff toxin NEGATIVE NEGATIVE   C Diff interpretation Results are indeterminate. See PCR results.     Comment: Performed at [facility], [place] [address], [place], [place] [address]  C. Diff by PCR, Reflexed     Status: Abnormal   Collection Time: 01/16/21 11:28 AM  Result Value Ref Range   Toxigenic C. Difficile by PCR POSITIVE (A) NEGATIVE    Comment: Positive for toxigenic C. difficile with little to no toxin production. Only treat if clinical presentation suggests symptomatic illness. Performed at [facility], [place] [address], [place], [place] [address]   CBC with Differential/Platelet     Status: Abnormal   Collection Time: 01/16/21  2:20 PM  Result Value Ref Range   WBC 9.4 4.0 - 10.5 K/uL   RBC 3.23 (L) 3.87 -  5.11 MIL/uL   Hemoglobin 10.0 (L) 12.0 - 15.0 g/dL   HCT 32.2 (L) 36.0 - 46.0 %   MCV 99.7 80.0 - 100.0 fL   MCH 31.0 26.0 - 34.0 pg   MCHC 31.1 30.0 - 36.0 g/dL   RDW 21.2 (H) 11.5 - 15.5 %   Platelets 288 150 - 400 K/uL   nRBC 0.0 0.0 - 0.2 %   Neutrophils Relative % 52 %   Neutro Abs 4.8 1.7 - 7.7 K/uL   Lymphocytes Relative 37 %  Lymphs Abs 3.5 0.7 - 4.0 K/uL   Monocytes Relative 9 %   Monocytes Absolute 0.9 0.1 - 1.0 K/uL   Eosinophils Relative 1 %   Eosinophils Absolute 0.1 0.0 - 0.5 K/uL   Basophils Relative 1 %   Basophils Absolute 0.1 0.0 - 0.1 K/uL   WBC Morphology VACUOLATED NEUTROPHILS    Immature Granulocytes 0 %   Abs Immature Granulocytes 0.04 0.00 - 0.07 K/uL   Tammy Sours Bodies PRESENT    Polychromasia PRESENT    Target Cells PRESENT     Comment: Performed at [facility], [place] [address], [place], [place] [address]  Magnesium     Status: None   Collection Time: 01/16/21  2:20 PM  Result Value Ref Range   Magnesium 1.9 1.7 - 2.4 mg/dL    Comment: Performed at [facility], [place] [address] [place], [place] [address]  Resp Panel by RT-PCR (Flu A&B, Covid) Nasopharyngeal Swab     Status: None   Collection Time: 01/16/21  3:47 PM   Specimen: Nasopharyngeal Swab; Nasopharyngeal(NP) swabs in vial transport medium  Result Value Ref Range   SARS Coronavirus 2 by RT PCR NEGATIVE NEGATIVE    Comment: (NOTE) SARS-CoV-2 target nucleic acids are NOT DETECTED.  The SARS-CoV-2 RNA is generally detectable in upper respiratory specimens during the acute phase of infection. The lowest concentration of SARS-CoV-2 viral copies this assay can detect is 138 copies/mL. A negative result does not preclude SARS-Cov-2 infection and should not be used as the sole basis for treatment or other patient management decisions. A negative result may occur with  improper specimen collection/handling, submission of specimen other than nasopharyngeal  swab, presence of viral mutation(s) within the areas targeted by this assay, and inadequate number of viral copies(<138 copies/mL). A negative result must be combined with clinical observations, patient history, and epidemiological information. The expected result is Negative.  Fact Sheet for Patients:  EntrepreneurPulse.com.au  Fact Sheet for Healthcare Providers:  IncredibleEmployment.be  This test is no t yet approved or cleared by the [place] FDA and  has been authorized for detection and/or diagnosis of SARS-CoV-2 by FDA under an Emergency Use Authorization (EUA). This EUA will remain  in effect (meaning this test can be used) for the duration of the COVID-19 declaration under Section 564(b)(1) of the Act, 21 U.S.C.section 360bbb-3(b)(1), unless the authorization is terminated  or revoked sooner.       Influenza A by PCR NEGATIVE NEGATIVE   Influenza B by PCR NEGATIVE NEGATIVE    Comment: (NOTE) The Xpert Xpress SARS-CoV-2/FLU/RSV plus assay is intended as an aid in the diagnosis of influenza from Nasopharyngeal swab specimens and should not be used as a sole basis for treatment. Nasal washings and aspirates are unacceptable for Xpert Xpress SARS-CoV-2/FLU/RSV testing.  Fact Sheet for Patients: EntrepreneurPulse.com.au  Fact Sheet for Healthcare Providers: IncredibleEmployment.be  This test is not yet approved or cleared by the [place] FDA and has been authorized for detection and/or diagnosis of SARS-CoV-2 by FDA under an Emergency Use Authorization (EUA). This EUA will remain in effect (meaning this test can be used) for the duration of the COVID-19 declaration under Section 564(b)(1) of the Act, 21 U.S.C. section 360bbb-3(b)(1), unless the authorization is terminated or revoked.  Performed at [facility], [place] [address], [place], [place] [address]     CT  ABDOMEN PELVIS W CONTRAST  Result Date: 01/16/2021 CLINICAL DATA:  Surgery 5 weeks ago. Nausea, vomiting,  and diarrhea for 6 weeks, worsened this morning. Incisional drainage of yellow fluid starting this morning. Appendiceal cancer with pseudomyxoma peritonei, chemotherapy, pulmonary embolism post IVC filter placement, fibromyalgia EXAM: CT ABDOMEN AND PELVIS WITH CONTRAST TECHNIQUE: Multidetector CT imaging of the abdomen and pelvis was performed using the standard protocol following bolus administration of intravenous contrast. Sagittal and coronal MPR images reconstructed from axial data set. CONTRAST:  180mL OMNIPAQUE IOHEXOL 300 MG/ML SOLN IV. No oral contrast administered. COMPARISON:  10/28/2020 FINDINGS: Lower chest: Subsegmental atelectasis versus scarring lateral RIGHT lower lobe unchanged. Small posterior LEFT diaphragmatic hernia containing fat. Hepatobiliary: Nodular contour of the liver without discrete mass. Post cholecystectomy. Pancreas: Atrophic pancreas Spleen: Post splenectomy Adrenals/Urinary Tract: Unremarkable adrenal glands. Kidneys, ureters, and bladder normal appearance Stomach/Bowel: Anastomotic staple line at rectum within additional anastomotic staple lines in the RIGHT pelvis and central mid abdomen. Diffuse colonic wall thickening. Stomach decompressed. Small bowel loops demonstrate mild scattered wall thickening as well. No evidence of obstruction. Vascular/Lymphatic: IVC filter, with penetration of the IVC wall by multiple limbs. Vascular structures patent. No adenopathy. Reproductive: Uterus surgically absent.  Ovaries nonvisualized. Other: Scattered infiltrative changes of mesenteric and abdominal fat which may be related to interval surgery, though cannot exclude involvement by peritoneal tumor. Gas and fluid collection identified within anterior abdominal wall ventral surgical wound measuring 3.2 x 1.8 cm in greatest axial dimensions and extending 13 cm in length, concerning  for wound infection. Additional loculated fluid collection in the pelvis just superior and anterior to the urinary bladder measuring 5.5 x 2.6 x 3.8 cm, partially enhancing margins, sterile versus infected postoperative collection. Small amount of free air at anterior abdomen extending into the surgical wound. Musculoskeletal: Prior lumbosacral fusion. Scattered degenerative changes of thoracic and lumbar spine. IMPRESSION: Gas and fluid collection within the anterior abdominal wall ventral surgical wound measuring 3.2 x 1.8 x 13 cmconcerning for wound infection. Additional loculated fluid collection in the pelvis just superior and anterior to the urinary bladder measuring 5.5 x 2.6 x 3.8 cm, question sterile versus infected postoperative collection. Scattered infiltrative changes of mesenteric and abdominal fat which may be related to interval surgery, though cannot exclude involvement by peritoneal tumor. Small amount of free air extending into the surgical wound. Diffuse colonic and small bowel wall thickening which may be related to recent abdominal/intestinal surgery though enteritis not excluded Nodular contour of the liver question cirrhosis. Small posterior LEFT diaphragmatic hernia containing fat. Aortic Atherosclerosis (ICD10-I70.0). Electronically Signed   By: Lavonia Dana M.D.   On: 01/16/2021 13:40    ROS - all of the below systems have been reviewed with the patient and positives are indicated with bold text General: chills, fever or night sweats Eyes: blurry vision or double vision ENT: epistaxis or sore throat Allergy/Immunology: itchy/watery eyes or nasal congestion Hematologic/Lymphatic: bleeding problems, blood clots or swollen lymph nodes Endocrine: temperature intolerance or unexpected weight changes Breast: new or changing breast lumps or nipple discharge Resp: cough, shortness of breath, or wheezing CV: chest pain or dyspnea on exertion GI: as per HPI GU: dysuria, trouble voiding,  or hematuria MSK: joint pain or joint stiffness Neuro: TIA or stroke symptoms Derm: pruritus and skin lesion changes Psych: anxiety and depression  PE Blood pressure (!) 157/89, pulse 77, temperature 98.4 F (36.9 C), temperature source Oral, resp. rate (!) 21, height 5\' 6"  (1.676 m), weight 102.1 kg, SpO2 95 %. Constitutional: NAD; conversant; no deformities Eyes: Moist conjunctiva; no lid lag; anicteric; PERRL Neck: Trachea midline;  no thyromegaly Lungs: Normal respiratory effort; CV: RRR; ; no pitting edema GI: Abd her midline wound is and was completely closed except for 1 small opening in the lower portion of the incision with some fibrinous exudate.  There is no erythema.  There are no peritoneal signs.; no palpable hepatosplenomegaly MSK: Normal range of motion of extremities; no clubbing/cyanosis Psychiatric: Appropriate affect; alert and oriented x3   Results for orders placed or performed during the hospital encounter of 01/16/21 (from the past 48 hour(s))  Comprehensive metabolic panel     Status: Abnormal   Collection Time: 01/16/21 10:43 AM  Result Value Ref Range   Sodium 140 135 - 145 mmol/L   Potassium 2.9 (L) 3.5 - 5.1 mmol/L   Chloride 103 98 - 111 mmol/L   CO2 25 22 - 32 mmol/L   Glucose, Bld 122 (H) 70 - 99 mg/dL    Comment: Glucose reference range applies only to samples taken after fasting for at least 8 hours.   BUN 9 6 - 20 mg/dL   Creatinine, Ser 0.39 (L) 0.44 - 1.00 mg/dL   Calcium 8.8 (L) 8.9 - 10.3 mg/dL   Total Protein 7.3 6.5 - 8.1 g/dL   Albumin 2.6 (L) 3.5 - 5.0 g/dL   AST 28 15 - 41 U/L   ALT 14 0 - 44 U/L   Alkaline Phosphatase 185 (H) 38 - 126 U/L   Total Bilirubin 1.1 0.3 - 1.2 mg/dL   GFR, Estimated >60 >60 mL/min    Comment: (NOTE) Calculated using the CKD-EPI Creatinine Equation (2021)    Anion gap 12 5 - 15    Comment: Performed at [facility], [place] [address] [place], [place] [address]  Lipase, blood     Status:  None   Collection Time: 01/16/21 10:43 AM  Result Value Ref Range   Lipase 34 11 - 51 U/L    Comment: Performed at [facility], [place] [address], [place], [place] [address]  Lactic acid, plasma     Status: None   Collection Time: 01/16/21 11:14 AM  Result Value Ref Range   Lactic Acid, Venous 1.8 0.5 - 1.9 mmol/L    Comment: Performed at [facility], [place] [address] [place], [place] [address]  C Difficile Quick Screen w PCR reflex     Status: Abnormal   Collection Time: 01/16/21 11:28 AM   Specimen: Stool  Result Value Ref Range   C Diff antigen POSITIVE (A) NEGATIVE   C Diff toxin NEGATIVE NEGATIVE   C Diff interpretation Results are indeterminate. See PCR results.     Comment: Performed at [facility], [place] [address], [place], [place] [address]  C. Diff by PCR, Reflexed     Status: Abnormal   Collection Time: 01/16/21 11:28 AM  Result Value Ref Range   Toxigenic C. Difficile by PCR POSITIVE (A) NEGATIVE    Comment: Positive for toxigenic C. difficile with little to no toxin production. Only treat if clinical presentation suggests symptomatic illness. Performed at [facility], [place] [address] [place], [place] [address]   CBC with Differential/Platelet     Status: Abnormal   Collection Time: 01/16/21  2:20 PM  Result Value Ref Range   WBC 9.4 4.0 - 10.5 K/uL   RBC 3.23 (L) 3.87 - 5.11 MIL/uL   Hemoglobin 10.0 (L) 12.0 - 15.0 g/dL   HCT 32.2 (L) 36.0 - 46.0 %   MCV 99.7 80.0 -  100.0 fL   MCH 31.0 26.0 - 34.0 pg   MCHC 31.1 30.0 - 36.0 g/dL   RDW 21.2 (H) 11.5 - 15.5 %   Platelets 288 150 - 400 K/uL   nRBC 0.0 0.0 - 0.2 %   Neutrophils Relative % 52 %   Neutro Abs 4.8 1.7 - 7.7 K/uL   Lymphocytes Relative 37 %   Lymphs Abs 3.5 0.7 - 4.0 K/uL   Monocytes Relative 9 %   Monocytes Absolute 0.9 0.1 - 1.0 K/uL   Eosinophils Relative 1 %   Eosinophils Absolute 0.1 0.0 - 0.5 K/uL   Basophils Relative 1 %    Basophils Absolute 0.1 0.0 - 0.1 K/uL   WBC Morphology VACUOLATED NEUTROPHILS    Immature Granulocytes 0 %   Abs Immature Granulocytes 0.04 0.00 - 0.07 K/uL   Tammy Sours Bodies PRESENT    Polychromasia PRESENT    Target Cells PRESENT     Comment: Performed at [facility], [place] [address] [place], [place] [address]  Magnesium     Status: None   Collection Time: 01/16/21  2:20 PM  Result Value Ref Range   Magnesium 1.9 1.7 - 2.4 mg/dL    Comment: Performed at [facility], [place] [address] [place], [place] [address]  Resp Panel by RT-PCR (Flu A&B, Covid) Nasopharyngeal Swab     Status: None   Collection Time: 01/16/21  3:47 PM   Specimen: Nasopharyngeal Swab; Nasopharyngeal(NP) swabs in vial transport medium  Result Value Ref Range   SARS Coronavirus 2 by RT PCR NEGATIVE NEGATIVE    Comment: (NOTE) SARS-CoV-2 target nucleic acids are NOT DETECTED.  The SARS-CoV-2 RNA is generally detectable in upper respiratory specimens during the acute phase of infection. The lowest concentration of SARS-CoV-2 viral copies this assay can detect is 138 copies/mL. A negative result does not preclude SARS-Cov-2 infection and should not be used as the sole basis for treatment or other patient management decisions. A negative result may occur with  improper specimen collection/handling, submission of specimen other than nasopharyngeal swab, presence of viral mutation(s) within the areas targeted by this assay, and inadequate number of viral copies(<138 copies/mL). A negative result must be combined with clinical observations, patient history, and epidemiological information. The expected result is Negative.  Fact Sheet for Patients:  EntrepreneurPulse.com.au  Fact Sheet for Healthcare Providers:  IncredibleEmployment.be  This test is no t yet approved or cleared by the [place] FDA and  has been authorized for  detection and/or diagnosis of SARS-CoV-2 by FDA under an Emergency Use Authorization (EUA). This EUA will remain  in effect (meaning this test can be used) for the duration of the COVID-19 declaration under Section 564(b)(1) of the Act, 21 U.S.C.section 360bbb-3(b)(1), unless the authorization is terminated  or revoked sooner.       Influenza A by PCR NEGATIVE NEGATIVE   Influenza B by PCR NEGATIVE NEGATIVE    Comment: (NOTE) The Xpert Xpress SARS-CoV-2/FLU/RSV plus assay is intended as an aid in the diagnosis of influenza from Nasopharyngeal swab specimens and should not be used as a sole basis for treatment. Nasal washings and aspirates are unacceptable for Xpert Xpress SARS-CoV-2/FLU/RSV testing.  Fact Sheet for Patients: EntrepreneurPulse.com.au  Fact Sheet for Healthcare Providers: IncredibleEmployment.be  This test is not yet approved or cleared by the [place] FDA and has been authorized for detection and/or diagnosis of SARS-CoV-2 by FDA under an Emergency Use Authorization (EUA). This EUA will remain in  effect (meaning this test can be used) for the duration of the COVID-19 declaration under Section 564(b)(1) of the Act, 21 U.S.C. section 360bbb-3(b)(1), unless the authorization is terminated or revoked.  Performed at [facility], [place] [address] [place], [place] [address]     CT ABDOMEN PELVIS W CONTRAST  Result Date: 01/16/2021 CLINICAL DATA:  Surgery 5 weeks ago. Nausea, vomiting, and diarrhea for 6 weeks, worsened this morning. Incisional drainage of yellow fluid starting this morning. Appendiceal cancer with pseudomyxoma peritonei, chemotherapy, pulmonary embolism post IVC filter placement, fibromyalgia EXAM: CT ABDOMEN AND PELVIS WITH CONTRAST TECHNIQUE: Multidetector CT imaging of the abdomen and pelvis was performed using the standard protocol following bolus administration of intravenous contrast.  Sagittal and coronal MPR images reconstructed from axial data set. CONTRAST:  156mL OMNIPAQUE IOHEXOL 300 MG/ML SOLN IV. No oral contrast administered. COMPARISON:  10/28/2020 FINDINGS: Lower chest: Subsegmental atelectasis versus scarring lateral RIGHT lower lobe unchanged. Small posterior LEFT diaphragmatic hernia containing fat. Hepatobiliary: Nodular contour of the liver without discrete mass. Post cholecystectomy. Pancreas: Atrophic pancreas Spleen: Post splenectomy Adrenals/Urinary Tract: Unremarkable adrenal glands. Kidneys, ureters, and bladder normal appearance Stomach/Bowel: Anastomotic staple line at rectum within additional anastomotic staple lines in the RIGHT pelvis and central mid abdomen. Diffuse colonic wall thickening. Stomach decompressed. Small bowel loops demonstrate mild scattered wall thickening as well. No evidence of obstruction. Vascular/Lymphatic: IVC filter, with penetration of the IVC wall by multiple limbs. Vascular structures patent. No adenopathy. Reproductive: Uterus surgically absent.  Ovaries nonvisualized. Other: Scattered infiltrative changes of mesenteric and abdominal fat which may be related to interval surgery, though cannot exclude involvement by peritoneal tumor. Gas and fluid collection identified within anterior abdominal wall ventral surgical wound measuring 3.2 x 1.8 cm in greatest axial dimensions and extending 13 cm in length, concerning for wound infection. Additional loculated fluid collection in the pelvis just superior and anterior to the urinary bladder measuring 5.5 x 2.6 x 3.8 cm, partially enhancing margins, sterile versus infected postoperative collection. Small amount of free air at anterior abdomen extending into the surgical wound. Musculoskeletal: Prior lumbosacral fusion. Scattered degenerative changes of thoracic and lumbar spine. IMPRESSION: Gas and fluid collection within the anterior abdominal wall ventral surgical wound measuring 3.2 x 1.8 x 13  cmconcerning for wound infection. Additional loculated fluid collection in the pelvis just superior and anterior to the urinary bladder measuring 5.5 x 2.6 x 3.8 cm, question sterile versus infected postoperative collection. Scattered infiltrative changes of mesenteric and abdominal fat which may be related to interval surgery, though cannot exclude involvement by peritoneal tumor. Small amount of free air extending into the surgical wound. Diffuse colonic and small bowel wall thickening which may be related to recent abdominal/intestinal surgery though enteritis not excluded Nodular contour of the liver question cirrhosis. Small posterior LEFT diaphragmatic hernia containing fat. Aortic Atherosclerosis (ICD10-I70.0). Electronically Signed   By: Lavonia Dana M.D.   On: 01/16/2021 13:40     A/P: Patient status post extensive surgery in [place] for pseudomyxoma peritonei and carcinomatosis, now with C. difficile colitis, midline wound drainage, and a pelvic fluid collection.  I reviewed the CAT scan of her abdomen and pelvis.  I suspect this is a superficial wound seroma in the abdominal wall.  I do not believe it needs to be opened further currently.  Cultures are pending.  We will start wound packing through the small open wound daily. I suspect that the fluid collection in the pelvis may also be a sterile  fluid collection. For now, we will continue treatment of her C. difficile colitis and rehydration.  If she starts having fevers or her condition declines, we would consult interventional radiology to see if they could either place a drain or aspirate the fluid collection in the pelvis.  I have discussed this in detail with her and her husband.  They agree with our plans.  We will follow her closely with you  Coralie Keens, M.D. [facility] Use [email or web address] to contact on call provider

## 2021-01-16 NOTE — ED Provider Notes (Signed)
[place] DEPT Provider Note   CSN: [id number] Arrival date & time: 01/16/21  0946     History Chief Complaint  Patient presents with   Diarrhea   Wound Check    Wendy Barber is a 60 y.o. female with a history of diabetes,  metastatic appendiceal cancer, pseudomyxoma peritonei and peritoneal carcinomatosis who is status post exploratory laparotomy with debulking procedure and chemotherapy washout of the abdomen performed at [facility] in [place].  She is status post splenectomy.  The patient had the procedure done 01/01/2021.  She has had severe diarrhea since that time and has been taking Imodium without relief of her symptoms.  She has had significant pain has been taking oxycodone.  She finished her Dilaudid medication 2 days ago.  Yesterday she started feeling very fatigued with decreased appetite, increased pain.  This morning she went to the bathroom and had an area of her incision open.  Her husband states that there was so much fluid that came out that he thought she may have urinated on the floor.  It appears purulent and has foul odor.  She states that the pain is much worse than it has been and feels like it is both incisional and internal.  She has had some chills but denies nausea or vomiting.  She continues to have frequent diarrhea.  The history is provided by the patient, a relative and medical records.       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[place]) 03/19/2020   Chronic fatigue syndrome    Diabetes mellitus without complication (HCC)    DVT of deep femoral vein, left ([place]) 03/19/2020   Fibromyalgia    Goals of care, counseling/discussion 03/19/2020   Hypertension    Malignant pseudomyxoma peritonei ([place]) 03/19/2020   Presence of IVC filter 03/19/2020   Pulmonary embolism,  bilateral ([place]) 03/19/2020    Patient Active Problem List   Diagnosis Date Noted   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[place]) 03/19/2020   Goals of care, counseling/discussion 03/19/2020   Malignant pseudomyxoma peritonei ([place]) 03/19/2020   DVT of deep femoral vein, left ([place]) 03/19/2020   Pulmonary embolism, bilateral ([place]) 03/19/2020   Presence of IVC filter 03/19/2020   Hepatic encephalopathy ([place]) 11/22/2019   Confusion 11/21/2019   Autoimmune hepatitis ([place]) 11/21/2019   HTN (hypertension) 11/21/2019   DM2 (diabetes mellitus, type 2) ([place]) 11/21/2019   AMS (altered mental status) 11/21/2019   Closed right ankle fracture 08/27/2019    Past Surgical History:  Procedure Laterality Date   ABDOMINAL HYSTERECTOMY     ABDOMINAL SURGERY     ACHILLES TENDON REPAIR     APPENDECTOMY     ARTHROPLASTY     CARPAL TUNNEL RELEASE     CHOLECYSTECTOMY     IR US GUIDE BX ASP/DRAIN  11/25/2019   JOINT REPLACEMENT     KNEE ARTHROSCOPY     ORIF ANKLE FRACTURE Right 08/27/2019   Procedure: OPEN REDUCTION INTERNAL FIXATION RIGHT ANKLE FRACTURE;  Surgeon: Renette Butters, MD;  Location: [facility] ORS;  Service: Orthopedics;  Laterality: Right;   perineorrophy     TONGUE BIOPSY       OB History   No obstetric history on file.     History reviewed. No pertinent family history.  Social History   Tobacco  Use   Smoking status: Never Smoker   Smokeless tobacco: Never Used  Substance Use Topics   Alcohol use: No   Drug use: Never    Home Medications Prior to Admission medications   Medication Sig Start Date End Date Taking? Authorizing Provider  amLODipine (NORVASC) 5 MG tablet Take 5 mg by mouth daily. 01/12/20   [provider]  AZASAN 75 MG TABS Take 2 tablets by mouth daily. Patient not taking: Reported on 07/27/2020 07/10/20   [provider]  azaTHIOprine (IMURAN) 50 MG tablet Take 50 mg by mouth in the morning, at  noon, and at bedtime. 04/29/20   [provider]  busPIRone (BUSPAR) 5 MG tablet Take 5 mg by mouth 3 (three) times daily as needed (anxiety).     [provider]  diazepam (VALIUM) 5 MG tablet Take 40 minutes prior to MRI Patient not taking: Reported on 07/27/2020 07/16/20   Volanda Napoleon, MD  Dulaglutide (TRULICITY) 3.41 PF/7.9KW SOPN Inject 0.75 mg into the skin once a week. Tuesday    [provider]  furosemide (LASIX) 20 MG tablet Take 20 mg by mouth daily as needed.  Patient not taking: Reported on 07/27/2020 01/22/20   [provider]  insulin glargine (LANTUS) 100 UNIT/ML injection Inject 0.1 mLs (10 Units total) into the skin at bedtime. Before bed Patient taking differently: Inject 15 Units into the skin at bedtime. Before bed 11/26/19   Nita Sells, MD  insulin lispro (HUMALOG) 100 UNIT/ML injection Inject 0.08 mLs (8 Units total) into the skin 3 (three) times daily before meals. Patient taking differently: Inject 13 Units into the skin 3 (three) times daily before meals.  11/26/19   Nita Sells, MD  metFORMIN (GLUCOPHAGE) 500 MG tablet Take 500 mg by mouth 2 (two) times daily with a meal.     [provider]  naproxen (NAPRELAN) 500 MG 24 hr tablet Take 2 tablets by mouth as needed. 04/09/09   [provider]  omeprazole (PRILOSEC) 40 MG capsule Take 40 mg by mouth at bedtime.    [provider]  ondansetron (ZOFRAN) 8 MG tablet Take 8 mg by mouth 3 (three) times daily. 02/26/20   [provider]  oxyCODONE (OXY IR/ROXICODONE) 5 MG immediate release tablet Take 5 mg by mouth 2 (two) times daily as needed. 02/14/20   [provider]  promethazine (PHENERGAN) 12.5 MG tablet Take 12.5 mg by mouth daily. 04/22/20   [provider]  propranolol (INDERAL) 20 MG tablet Take 20 mg by mouth 2 (two) times daily.     [provider]  sucralfate (CARAFATE) 1 g tablet Take 1 g by mouth in the  morning, at noon, and at bedtime.  02/14/20   [provider]  valsartan-hydrochlorothiazide (DIOVAN-HCT) 160-12.5 MG tablet Take 1 tablet by mouth daily. 03/20/20   [provider]    Allergies    Codeine, Penicillins, Alprazolam, Ativan [lorazepam], Corticosteroids, Erythromycin, Erythromycin base, Milnacipran hcl, Prednisone, Savella  [milnacipran], Povidone iodine, and Prednisolone  Review of Systems   Review of Systems Ten systems reviewed and are negative for acute change, except as noted in the HPI.   Physical Exam Updated Vital Signs BP (!) 171/115    Pulse 72    Temp 98.4 F (36.9 C) (Oral)    Resp 19    SpO2 94%   Physical Exam Vitals and nursing note reviewed.  Constitutional:      General: She is not in acute distress.  Appearance: She is well-developed. She is obese. She is not diaphoretic.     Comments: Foul odor noted in the room patient is tearful and appears extremely uncomfortable  HENT:     Head: Normocephalic and atraumatic.  Eyes:     General: No scleral icterus.    Conjunctiva/sclera: Conjunctivae normal.  Cardiovascular:     Rate and Rhythm: Normal rate and regular rhythm.     Heart sounds: Normal heart sounds. No murmur heard. No friction rub. No gallop.   Pulmonary:     Effort: Pulmonary effort is normal. No respiratory distress.     Breath sounds: Normal breath sounds.  Abdominal:     General: A surgical scar is present. Bowel sounds are normal.     Palpations: Abdomen is soft. There is no mass.     Tenderness: There is abdominal tenderness. There is no guarding.    Musculoskeletal:     Cervical back: Normal range of motion.  Skin:    General: Skin is warm and dry.  Neurological:     Mental Status: She is alert and oriented to person, place, and time.  Psychiatric:        Behavior: Behavior normal.     ED Results / Procedures / Treatments   Labs (all labs ordered are listed, but only abnormal results are displayed) Labs  Reviewed  GASTROINTESTINAL PANEL BY PCR, STOOL (REPLACES STOOL CULTURE)  C DIFFICILE QUICK SCREEN W PCR REFLEX  CULTURE, BLOOD (ROUTINE X 2)  CULTURE, BLOOD (ROUTINE X 2)  COMPREHENSIVE METABOLIC PANEL  LIPASE, BLOOD  CBC WITH DIFFERENTIAL/PLATELET  LACTIC ACID, PLASMA    EKG EKG Interpretation  Date/Time:  Saturday January 16 2021 11:21:07 EDT Ventricular Rate:  70 PR Interval:    QRS Duration: 91 QT Interval:  453 QTC Calculation: 489 R Axis:   -168 Text Interpretation: Right and left arm electrode reversal, interpretation assumes no reversal Sinus or ectopic atrial rhythm Inferior infarct, old Probable lateral infarct, age indeterminate accounting for arm lead reversal No significant change since last tracing Confirmed by Calvert Cantor [phone]) on 01/16/2021 11:24:37 AM   Radiology No results found.  Procedures Procedures   Medications Ordered in ED Medications  sodium chloride 0.9 % bolus 1,000 mL (has no administration in time range)  HYDROmorphone (DILAUDID) injection 0.5 mg (has no administration in time range)  sodium chloride 0.9 % bolus 1,000 mL (has no administration in time range)    ED Course  I have reviewed the triage vital signs and the nursing notes.  Pertinent labs & imaging results that were available during my care of the patient were reviewed by me and considered in my medical decision making (see chart for details).  Clinical Course as of 01/16/21 1242  Sat Jan 16, 2021  1242 Potassium(!): 2.9 [AH]    Clinical Course User Index [AH] Margarita Mail, PA-C   MDM Rules/Calculators/A&P                          Patient here with diarrhea and abdominal pain The differential diagnosis of diarrhea includes but is not limited to Viral- norovirus/rotavirus; Bacterial-Campylobacter,Shigella, Salmonella, Escherichia coli, E. coli 0157:H7, Yersinia enterocolitica, Vibrio cholerae, Clostridium difficile. Parasitic- Giardia lamblia,  Cryptosporidium,Entamoeba histolytica,Cyclospora, Microsporidium. Toxin- Staphylococcus aureus, Bacillus cereus. Noninfectious causes include GI Bleed, Appendicitis, Mesenteric Ischemia, Diverticulitis, Adrenal Crisis, Thyroid Storm, Toxicologic exposures, Antibiotic or drug-associated, inflammatory bowel disease. I ordered and reviewed labs that include a CBC without leukocytosis, CMP with  hypokalemia likely from her diarrhea.  C. difficile positive.  Negative Covid screen.  Mag within normal limits.  I ordered and reviewed images that included CT of the abdomen and pelvis with contrast.  Findings of gas and fluid collection within the anterior abdominal wall concerning for infection along with loculated fluid collection in the pelvis just above the urinary back bladder along with diffuse colonic small bowel wall thickening consistent with C. difficile colitis. Patient treated in the emergency department with vancomycin for intra-abdominal and abdominal wall infection, Flagyl for C. difficile colitis.  I have also ordered pain medications multiple times, abdominal wall abscess culture obtained..  I discussed the case with Dr. Ninfa Linden who is asked for a medicine admission and he will consult.  Case discussed with Dr. Madelin Headings who will admit the patient. Final Clinical Impression(s) / ED Diagnoses Final diagnoses:  None    Rx / DC Orders ED Discharge Orders    None       Margarita Mail, PA-C 01/16/21 1629    Truddie Hidden, MD 01/17/21 [phone]

## 2021-01-16 NOTE — Progress Notes (Signed)
Pharmacy Antibiotic Note  Wendy Barber is a 60 y.o. female admitted on 01/16/2021 with postop wound infection.  Pharmacy has been consulted for Vancomycin and Cefepime dosing. S/p exp lap on 01/01/21 in [place].  She presents with N/V/D, wound drainage.  3/26 CT concerning for wound infection (gas and fluid collection in anterior abdominal wall, loculated fluid collection in the pelvis)  Afebrile, WBC 9.4, SCr low at 0.39  Note PCN allergy (SOB, irregular/rapid HR), but patient believes she has taken Keflex in the past and Dr. Neysa Bonito confirms that she received Cefotetan perioperatively.    Plan:  Metronidazole per MD  Cefepime 2g IV q8h  Vancomycin 1500 mg IV q24h. (SCr rounded 0.8, Vd 0.5, Est AUC 459)  Follow up renal function, culture results, and clinical course.   Height: 5\' 6"  (167.6 cm) Weight: 102.1 kg (225 lb) IBW/kg (Calculated) : 59.3  Temp (24hrs), Avg:98.3 F (36.8 C), Min:98.2 F (36.8 C), Max:98.4 F (36.9 C)  Recent Labs  Lab 01/16/21 1043 01/16/21 1114 01/16/21 1420  WBC  --   --  9.4  CREATININE 0.39*  --   --   LATICACIDVEN  --  1.8  --     CrCl cannot be calculated (Unknown ideal weight.).    Allergies  Allergen Reactions  . Codeine Shortness Of Breath and Palpitations  . Penicillins Shortness Of Breath    Other reaction(s): Irregular Heart Rate, Other (See Comments) Rapid heartrate   . Alprazolam Hives and Other (See Comments)    Note: tolerates midazolam fine unresponsive Hard to arouse unresponsive Hard to arouse  Other reaction(s): Other (See Comments) unresponsive  . Ativan [Lorazepam] Other (See Comments)    Note: tolerates midazolam fine Face & Throat Swelling.  . Corticosteroids Other (See Comments)    Other reaction(s): Other (see comments) Psychotic behaviour    . Erythromycin     Other reaction(s): Other (See Comments) Severe stomach pain   . Erythromycin Base Hives  . Milnacipran Hcl Other (See Comments)     Other reaction(s):  psychotic episode     . Prednisone Other (See Comments)    Anxiety & Nervous Breakdown.  Ocie Cornfield  [Milnacipran]   . Povidone Iodine Rash    Had rash under bandage after surgery , has had betadine since without reaction   . Prednisolone Anxiety    Antimicrobials this admission: 3/26 Vancomycin >>  3/26 Metronidazole >>  3/26 Cefepime >>   Dose adjustments this admission:   Microbiology results:   Thank you for allowing pharmacy to be a part of this patient's care.  Gretta Arab PharmD, BCPS Clinical Pharmacist [facility] main pharmacy [phone] 01/16/2021 5:31 PM

## 2021-01-16 NOTE — ED Triage Notes (Addendum)
Pt arrived via walk in, c/o n/v diarrhea x6 weeks after major intestional surgery. States this has been ongoing but worsening this morning. Also states incision started draining yellow fluid this morning. Denies any fevers.

## 2021-01-16 NOTE — Consult Note (Signed)
[facility] Nurse Consult Note: Reason for [place] simultaneously consulted with [facility] (Surgery) for care of the patient's distal aspect of the midline surgical wound. Dr. Ninfa Linden has seen the patient this evenint and have provided orders for Nursing.  They will follow the patient closely. Wound type: Surgical Pressure Injury POA: N/A  There is currently no role for WOC nursing.  [place] nursing team will not follow, but will remain available to this patient, the nursing and medical teams.  Please re-consult if needed. Thanks, Maudie Flakes, MSN, RN, [place], Arther Abbott  Pager# [phone]

## 2021-01-16 NOTE — Plan of Care (Signed)

## 2021-01-17 DIAGNOSIS — T8149XA Infection following a procedure, other surgical site, initial encounter: Secondary | ICD-10-CM | POA: Diagnosis not present

## 2021-01-17 LAB — HEMOGLOBIN A1C
Hgb A1c MFr Bld: 5.8 % — ABNORMAL HIGH (ref 4.8–5.6)
Mean Plasma Glucose: 119.76 mg/dL

## 2021-01-17 LAB — BASIC METABOLIC PANEL
Anion gap: 9 (ref 5–15)
BUN: 8 mg/dL (ref 6–20)
CO2: 23 mmol/L (ref 22–32)
Calcium: 8.2 mg/dL — ABNORMAL LOW (ref 8.9–10.3)
Chloride: 106 mmol/L (ref 98–111)
Creatinine, Ser: 0.53 mg/dL (ref 0.44–1.00)
GFR, Estimated: >60 mL/min (ref >60)
Glucose, Bld: 116 mg/dL — ABNORMAL HIGH (ref 70–99)
Potassium: 3.1 mmol/L — ABNORMAL LOW (ref 3.5–5.1)
Sodium: 138 mmol/L (ref 135–145)

## 2021-01-17 LAB — CBC
HCT: 30.5 % — ABNORMAL LOW (ref 36.0–46.0)
Hemoglobin: 9.6 g/dL — ABNORMAL LOW (ref 12.0–15.0)
MCH: 31.6 pg (ref 26.0–34.0)
MCHC: 31.5 g/dL (ref 30.0–36.0)
MCV: 100.3 fL — ABNORMAL HIGH (ref 80.0–100.0)
Platelets: 251 10*3/uL (ref 150–400)
RBC: 3.04 MIL/uL — ABNORMAL LOW (ref 3.87–5.11)
RDW: 21.4 % — ABNORMAL HIGH (ref 11.5–15.5)
WBC: 8.1 10*3/uL (ref 4.0–10.5)
nRBC: 0 % (ref 0.0–0.2)

## 2021-01-17 LAB — HIV ANTIBODY (ROUTINE TESTING W REFLEX): HIV Screen 4th Generation wRfx: NONREACTIVE

## 2021-01-17 MED ORDER — PROPRANOLOL HCL 20 MG PO TABS
20.0000 mg | ORAL_TABLET | Freq: Two times a day (BID) | ORAL | Status: DC
  Administered 2021-01-17 – 2021-02-10 (×48): 20 mg via ORAL
  Filled 2021-01-17 (×48): qty 1

## 2021-01-17 MED ORDER — AMLODIPINE BESYLATE 5 MG PO TABS
5.0000 mg | ORAL_TABLET | Freq: Every day | ORAL | Status: DC
  Administered 2021-01-18 – 2021-01-20 (×3): 5 mg via ORAL
  Filled 2021-01-17 (×3): qty 1

## 2021-01-17 MED ORDER — HYDROMORPHONE HCL 1 MG/ML IJ SOLN
1.0000 mg | INTRAMUSCULAR | Status: DC | PRN
  Administered 2021-01-17 – 2021-01-28 (×53): 1 mg via INTRAVENOUS
  Filled 2021-01-17 (×55): qty 1

## 2021-01-17 MED ORDER — OXYCODONE HCL 5 MG PO TABS
10.0000 mg | ORAL_TABLET | ORAL | Status: DC | PRN
  Administered 2021-01-17: 10 mg via ORAL
  Filled 2021-01-17: qty 2

## 2021-01-17 MED ORDER — ACETAMINOPHEN 500 MG PO TABS
1000.0000 mg | ORAL_TABLET | Freq: Three times a day (TID) | ORAL | Status: AC
  Administered 2021-01-17 – 2021-01-20 (×9): 1000 mg via ORAL
  Filled 2021-01-17 (×9): qty 2

## 2021-01-17 MED ORDER — HYDROMORPHONE HCL 2 MG PO TABS
2.0000 mg | ORAL_TABLET | ORAL | Status: DC | PRN
  Administered 2021-01-18 – 2021-01-28 (×42): 2 mg via ORAL
  Filled 2021-01-17 (×44): qty 1

## 2021-01-17 MED ORDER — POTASSIUM CHLORIDE CRYS ER 20 MEQ PO TBCR
40.0000 meq | EXTENDED_RELEASE_TABLET | Freq: Once | ORAL | Status: AC
  Administered 2021-01-17: 40 meq via ORAL
  Filled 2021-01-17: qty 2

## 2021-01-17 MED ORDER — DICLOFENAC SODIUM 1 % EX GEL
2.0000 g | Freq: Four times a day (QID) | CUTANEOUS | Status: DC | PRN
  Filled 2021-01-17: qty 100

## 2021-01-17 MED ORDER — OXYCODONE HCL 5 MG PO TABS: 5.0000 mg | ORAL_TABLET | ORAL | Status: DC | PRN

## 2021-01-17 NOTE — Progress Notes (Addendum)
PROGRESS NOTE    Wendy Barber  IEP:[id number] DOB: 06/17/1961 DOA: 01/16/2021 PCP: Glendon Axe, MD   Chief Complaint  Patient presents with  . Diarrhea  . 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 Since her hospitalization in February she has had persistent diarrhea but tested negative for C. difficile and was started on Imodium and Lomotil with some improvement.  She was recently seen by her oncologist in [place], [place] on 3/16 and patient states that she started antibiotics at that time but is unsure what medication.  Says that her diarrhea has since worsened. Yesterday she started feeling very fatigued with decreased appetite and increasing pain and this morning she went to the bathroom and had an area of her incision open with a significant amount of purulent foul-smelling drainage.  She denies any fever, chills, chest pain.  Has chronic nausea which she takes Zofran multiple times daily for.  Of note, patient reports that she is up-to-date with her vaccines in regards to her splenectomy.  Regarding her recent surgery: She is s/p cytoreductive surgery/HIPEC 12/10/2020 at [facility] in [facility] with major reduction of small bowel, proximal jejunum, ascending colon, partial cystectomy.  Postoperative course was notable for anastomotic leak and she was taken back to the OR on POD #10 and closure of abdominal wound on POD #13.  She had a JP drain which was recently removed on 3/16.  She was admitted for C diff colitis and  concern for post op wound infection.    Assessment & Plan:   Principal Problem:   Surgical wound infection Active Problems:   Autoimmune hepatitis ([place])   DM2 (diabetes mellitus, type 2) ([place])   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)   DVT of deep femoral vein, left (HCC)   Pulmonary embolism, bilateral (HCC)   Presence of IVC filter   C. difficile enteritis  1. Abdominal post surgical wound infection a. Afebrile and hemodynamically stable on room air without leukocytosis   b. s/p cytoreductive surgery/HIPEC 12/10/2020 at [facility] in [place], [place] with major reduction of small bowel, proximal jejunum, ascending colon, partial cystectomy.  Postoperative course was notable for anastomotic leak and she was taken back to the OR on POD #10 and closure of abdominal wound on POD #13.  She had a JP drain which was recently removed on 3/16. c. CT reveals: Anterior abdominal wall ventral surgical wound collection three 3.2 x 1.8 x 13 cm concerning for infection as well as additional loculated fluid collection in the pelvis superior and anterior to the urinary bladder measuring 5.5 x 2.6 x 3.8 cm d. Patient is asplenic e. Appreciate surgery recommendations -> suspected superficial wound seroma in abdominal wall, recommended wound packing daily.  Suspects fluid collection in sterile fluid collection is sterile.  f. Aerobic cx pending 3/26  g. Afebrile, normal WBC count - in setting of suspected post op fluid collections - will hold abx for now - especially in setting of c diff below h. Per surgery, may need IR evaluation to see if any fluid collections can be aspirated i. Continue pain management  j. IV fluids k. WOCN l. General surgery consulted  2. C. difficile enteritis a. Continued diarrhea b. P.o. vancomycin c. Hold home Imodium and Lomotil  3. Metastatic appendiceal cancer, pseudomyxoma peritonei and peritoneal carcinomatosis s/p CRS/HIPEC and  splenectomy in 2005 and recently CRS/HIPEC on 12/10/2020 for recurrent cancer a. Will notify oncology   4. History of DVT and PE s/p IVC filter a. Continue home prophylactic Lovenox  5. Hypertension a. Amlodipine and propanolol   6. Essential Tremor 1. Propanolol   7. Anxiety a. Continue home BuSpar  7. Type 2 diabetes a. Glucose 122 and has not been taking her insulin at home b. Check HbA1c 5.8 *prediabets range c. Control with diet for now  8. Autoimmune hepatitis a. Holding home azathioprine  9. Hypokalemia a. Likely from GI losses b. will give an additional 40 mEq KCl p.o.  DVT prophylaxis: lovenox Code Status: full  Family Communication: none at bedside Disposition:   Status is: Inpatient  Remains inpatient appropriate because:Inpatient level of care appropriate due to severity of illness   Dispo: The patient is from: Home              Anticipated d/c is to: Home              Patient currently is not medically stable to d/c.   Difficult to place patient No       Consultants:   surgery  Procedures: none  Antimicrobials: Anti-infectives (From admission, onward)   Start     Dose/Rate Route Frequency Ordered Stop   01/17/21 0600  vancomycin (VANCOREADY) IVPB 1500 mg/300 mL  Status:  Discontinued        1,500 mg 150 mL/hr over 120 Minutes Intravenous Every 24 hours 01/16/21 1732 01/17/21 1216   01/16/21 2200  metroNIDAZOLE (FLAGYL) IVPB 500 mg  Status:  Discontinued        500 mg 100 mL/hr over 60 Minutes Intravenous Every 8 hours 01/16/21 1622 01/17/21 1216   01/16/21 2000  vancomycin (VANCOCIN) 50 mg/mL oral solution 125 mg        125 mg Oral 4 times daily 01/16/21 1805 01/26/21 2159   01/16/21 1800  ceFEPIme (MAXIPIME) 2 g in sodium chloride 0.9 % 100 mL IVPB  Status:  Discontinued        2 g 200 mL/hr over 30 Minutes Intravenous Every 8 hours 01/16/21 1728 01/17/21 1216   01/16/21 1545  vancomycin (VANCOCIN) 50 mg/mL oral solution 125 mg   Status:  Discontinued        125 mg Oral 4 times daily 01/16/21 1539 01/16/21 1622   01/16/21 1445  metroNIDAZOLE (FLAGYL) IVPB 500 mg        500 mg 100 mL/hr over 60 Minutes Intravenous  Once 01/16/21 1438 01/16/21 1622   01/16/21 1330  vancomycin (VANCOCIN) IVPB 1000 mg/200 mL premix        1,000 mg 200 mL/hr over 60 Minutes Intravenous  Once 01/16/21 1320 01/16/21 1538         Subjective: Concerned about chronic pain Back pain  Notes dilaudid works best for her diarrhea  Objective: Vitals:   01/16/21 1700 01/16/21 2011 01/16/21 2352 01/17/21 0408  BP:  (!) 161/84 (!) 157/84 (!) 157/88  Pulse:  85 79 79  Resp:  20 20 20   Temp:  99 F (37.2 C) 98.5 F (36.9 C) 98.6 F (37 C)  TempSrc:      SpO2:  96% 94% 93%  Weight: 102.1 kg  Height: 5\' 6"  (1.676 m)       Intake/Output Summary (Last 24 hours) at 01/17/2021 1317 Last data filed at 01/17/2021 1029 Gross per 24 hour  Intake 1183 ml  Output 302 ml  Net 881 ml   Filed Weights   01/16/21 1700  Weight: 102.1 kg    Examination:  General exam: Appears anxious Respiratory system: unlabored Cardiovascular system: RRR Gastrointestinal system: mild abdominal TTP - surgical scar - dehiscence to lower wound with dressing Central nervous system: Alert and oriented. No focal neurological deficits. Extremities: no LEE Skin: No rashes, lesions or ulcers Psychiatry: Judgement and insight appear normal. Mood & affect appropriate.     Data Reviewed: I have personally reviewed following labs and imaging studies  CBC: Recent Labs  Lab 01/16/21 1420 01/17/21 0543  WBC 9.4 8.1  NEUTROABS 4.8  --   HGB 10.0* 9.6*  HCT 32.2* 30.5*  MCV 99.7 100.3*  PLT 288 810    Basic Metabolic Panel: Recent Labs  Lab 01/16/21 1043 01/16/21 1420 01/17/21 0543  NA 140  --  138  K 2.9*  --  3.1*  CL 103  --  106  CO2 25  --  23  GLUCOSE 122*  --  116*  BUN 9  --  8  CREATININE 0.39*  --  0.53  CALCIUM 8.8*  --  8.2*  MG   --  1.9  --     GFR: Estimated Creatinine Clearance: 92.4 mL/min (by C-G formula based on SCr of 0.53 mg/dL).  Liver Function Tests: Recent Labs  Lab 01/16/21 1043  AST 28  ALT 14  ALKPHOS 185*  BILITOT 1.1  PROT 7.3  ALBUMIN 2.6*    CBG: No results for input(s): GLUCAP in the last 168 hours.   Recent Results (from the past 240 hour(s))  Blood culture (routine x 2)     Status: None (Preliminary result)   Collection Time: 01/16/21 11:14 AM   Specimen: BLOOD RIGHT HAND  Result Value Ref Range Status   Specimen Description   Final    BLOOD RIGHT HAND Performed at [facility], [place] [address], [place], [place] [address]    Special Requests   Final    BOTTLES DRAWN AEROBIC AND ANAEROBIC Blood Culture results may not be optimal due to an inadequate volume of blood received in culture bottles Performed at [facility] [address] [place], [place] [address]    Culture PENDING  Incomplete   Report Status PENDING  Incomplete  Gastrointestinal Panel by PCR , Stool     Status: None   Collection Time: 01/16/21 11:28 AM   Specimen: Stool  Result Value Ref Range Status   Campylobacter species NOT DETECTED NOT DETECTED Final   Plesimonas shigelloides NOT DETECTED NOT DETECTED Final   Salmonella species NOT DETECTED NOT DETECTED Final   Yersinia enterocolitica NOT DETECTED NOT DETECTED Final   Vibrio species NOT DETECTED NOT DETECTED Final   Vibrio cholerae NOT DETECTED NOT DETECTED Final   Enteroaggregative E coli (EAEC) NOT DETECTED NOT DETECTED Final   Enteropathogenic E coli (EPEC) NOT DETECTED NOT DETECTED Final   Enterotoxigenic E coli (ETEC) NOT DETECTED NOT DETECTED Final   Shiga like toxin producing E coli (STEC) NOT DETECTED NOT DETECTED Final   Shigella/Enteroinvasive E coli (EIEC) NOT DETECTED NOT DETECTED Final   Cryptosporidium NOT DETECTED NOT DETECTED Final   Cyclospora cayetanensis NOT DETECTED NOT DETECTED Final   Entamoeba  histolytica NOT DETECTED  NOT DETECTED Final   Giardia lamblia NOT DETECTED NOT DETECTED Final   Adenovirus F40/41 NOT DETECTED NOT DETECTED Final   Astrovirus NOT DETECTED NOT DETECTED Final   Norovirus GI/GII NOT DETECTED NOT DETECTED Final   Rotavirus A NOT DETECTED NOT DETECTED Final   Sapovirus (I, II, IV, and V) NOT DETECTED NOT DETECTED Final    Comment: Performed at [facility], [place], [place] [address]  C Difficile Quick Screen w PCR reflex     Status: Abnormal   Collection Time: 01/16/21 11:28 AM   Specimen: Stool  Result Value Ref Range Status   C Diff antigen POSITIVE (A) NEGATIVE Final   C Diff toxin NEGATIVE NEGATIVE Final   C Diff interpretation Results are indeterminate. See PCR results.  Final    Comment: Performed at [facility], [place] [address], [place], [place] [address]  C. Diff by PCR, Reflexed     Status: Abnormal   Collection Time: 01/16/21 11:28 AM  Result Value Ref Range Status   Toxigenic C. Difficile by PCR POSITIVE (A) NEGATIVE Final    Comment: Positive for toxigenic C. difficile with little to no toxin production. Only treat if clinical presentation suggests symptomatic illness. Performed at [facility], [place] [address] [place], [place] [address]   Aerobic Culture w Gram Stain (superficial specimen)     Status: None (Preliminary result)   Collection Time: 01/16/21  2:20 PM   Specimen: Abdomen; Wound  Result Value Ref Range Status   Specimen Description   Final    ABDOMEN Performed at [facility] [address] [place], [place] [address]    Special Requests   Final    NONE Performed at [facility], [place] [address], [place], [place] [address]    Gram Stain   Final    FEW WBC PRESENT, PREDOMINANTLY PMN NO ORGANISMS SEEN    Culture   Final    CULTURE REINCUBATED FOR BETTER GROWTH Performed at [facility], [place] [address], [place],  [address]     Report Status PENDING  Incomplete  Resp Panel by RT-PCR (Flu A&B, Covid) Nasopharyngeal Swab     Status: None   Collection Time: 01/16/21  3:47 PM   Specimen: Nasopharyngeal Swab; Nasopharyngeal(NP) swabs in vial transport medium  Result Value Ref Range Status   SARS Coronavirus 2 by RT PCR NEGATIVE NEGATIVE Final    Comment: (NOTE) SARS-CoV-2 target nucleic acids are NOT DETECTED.  The SARS-CoV-2 RNA is generally detectable in upper respiratory specimens during the acute phase of infection. The lowest concentration of SARS-CoV-2 viral copies this assay can detect is 138 copies/mL. A negative result does not preclude SARS-Cov-2 infection and should not be used as the sole basis for treatment or other patient management decisions. A negative result may occur with  improper specimen collection/handling, submission of specimen other than nasopharyngeal swab, presence of viral mutation(s) within the areas targeted by this assay, and inadequate number of viral copies(<138 copies/mL). A negative result must be combined with clinical observations, patient history, and epidemiological information. The expected result is Negative.  Fact Sheet for Patients:  EntrepreneurPulse.com.au  Fact Sheet for Healthcare Providers:  IncredibleEmployment.be  This test is no t yet approved or cleared by the [place] FDA and  has been authorized for detection and/or diagnosis of SARS-CoV-2 by FDA under an Emergency Use Authorization (EUA). This EUA will remain  in effect (meaning this test can  be used) for the duration of the COVID-19 declaration under Section 564(b)(1) of the Act, 21 U.S.C.section 360bbb-3(b)(1), unless the authorization is terminated  or revoked sooner.       Influenza A by PCR NEGATIVE NEGATIVE Final   Influenza B by PCR NEGATIVE NEGATIVE Final    Comment: (NOTE) The Xpert Xpress SARS-CoV-2/FLU/RSV plus assay is intended as an aid in the  diagnosis of influenza from Nasopharyngeal swab specimens and should not be used as a sole basis for treatment. Nasal washings and aspirates are unacceptable for Xpert Xpress SARS-CoV-2/FLU/RSV testing.  Fact Sheet for Patients: EntrepreneurPulse.com.au  Fact Sheet for Healthcare Providers: IncredibleEmployment.be  This test is not yet approved or cleared by the [place] FDA and has been authorized for detection and/or diagnosis of SARS-CoV-2 by FDA under an Emergency Use Authorization (EUA). This EUA will remain in effect (meaning this test can be used) for the duration of the COVID-19 declaration under Section 564(b)(1) of the Act, 21 U.S.C. section 360bbb-3(b)(1), unless the authorization is terminated or revoked.  Performed at [facility], [place] [address], [place],  [address]          Radiology Studies: CT ABDOMEN PELVIS W CONTRAST  Result Date: 01/16/2021 CLINICAL DATA:  Surgery 5 weeks ago. Nausea, vomiting, and diarrhea for 6 weeks, worsened this morning. Incisional drainage of yellow fluid starting this morning. Appendiceal cancer with pseudomyxoma peritonei, chemotherapy, pulmonary embolism post IVC filter placement, fibromyalgia EXAM: CT ABDOMEN AND PELVIS WITH CONTRAST TECHNIQUE: Multidetector CT imaging of the abdomen and pelvis was performed using the standard protocol following bolus administration of intravenous contrast. Sagittal and coronal MPR images reconstructed from axial data set. CONTRAST:  116mL OMNIPAQUE IOHEXOL 300 MG/ML SOLN IV. No oral contrast administered. COMPARISON:  10/28/2020 FINDINGS: Lower chest: Subsegmental atelectasis versus scarring lateral RIGHT lower lobe unchanged. Small posterior LEFT diaphragmatic hernia containing fat. Hepatobiliary: Nodular contour of the liver without discrete mass. Post cholecystectomy. Pancreas: Atrophic pancreas Spleen: Post splenectomy Adrenals/Urinary  Tract: Unremarkable adrenal glands. Kidneys, ureters, and bladder normal appearance Stomach/Bowel: Anastomotic staple line at rectum within additional anastomotic staple lines in the RIGHT pelvis and central mid abdomen. Diffuse colonic wall thickening. Stomach decompressed. Small bowel loops demonstrate mild scattered wall thickening as well. No evidence of obstruction. Vascular/Lymphatic: IVC filter, with penetration of the IVC wall by multiple limbs. Vascular structures patent. No adenopathy. Reproductive: Uterus surgically absent.  Ovaries nonvisualized. Other: Scattered infiltrative changes of mesenteric and abdominal fat which may be related to interval surgery, though cannot exclude involvement by peritoneal tumor. Gas and fluid collection identified within anterior abdominal wall ventral surgical wound measuring 3.2 x 1.8 cm in greatest axial dimensions and extending 13 cm in length, concerning for wound infection. Additional loculated fluid collection in the pelvis just superior and anterior to the urinary bladder measuring 5.5 x 2.6 x 3.8 cm, partially enhancing margins, sterile versus infected postoperative collection. Small amount of free air at anterior abdomen extending into the surgical wound. Musculoskeletal: Prior lumbosacral fusion. Scattered degenerative changes of thoracic and lumbar spine. IMPRESSION: Gas and fluid collection within the anterior abdominal wall ventral surgical wound measuring 3.2 x 1.8 x 13 cmconcerning for wound infection. Additional loculated fluid collection in the pelvis just superior and anterior to the urinary bladder measuring 5.5 x 2.6 x 3.8 cm, question sterile versus infected postoperative collection. Scattered infiltrative changes of mesenteric and abdominal fat which may be related to interval surgery, though cannot exclude involvement by peritoneal tumor. Small amount of  free air extending into the surgical wound. Diffuse colonic and small bowel wall thickening  which may be related to recent abdominal/intestinal surgery though enteritis not excluded Nodular contour of the liver question cirrhosis. Small posterior LEFT diaphragmatic hernia containing fat. Aortic Atherosclerosis (ICD10-I70.0). Electronically Signed   By: Lavonia Dana M.D.   On: 01/16/2021 13:40        Scheduled Meds: . busPIRone  5 mg Oral Q breakfast   And  . busPIRone  5 mg Oral Q1200   And  . busPIRone  10 mg Oral QHS  . enoxaparin  40 mg Subcutaneous Q24H  . pantoprazole  40 mg Oral q morning  . sodium chloride flush  3 mL Intravenous Q12H  . vancomycin  125 mg Oral QID   Continuous Infusions: . lactated ringers 100 mL/hr (01/17/21 0424)     LOS: 1 day    Time spent: over 30 min    Fayrene Helper, MD [facility]   To contact the attending provider between 7A-7P or the covering provider during after hours 7P-7A, please log into the web site [email or web address] and access using universal [place] password for that web site. If you do not have the password, please call the hospital operator.  01/17/2021, 1:17 PM

## 2021-01-17 NOTE — Progress Notes (Signed)
Subjective/Chief Complaint: Reports feeling some deep abdominal pressure Denies nausea Diarrhea less   Objective: Vital signs in last 24 hours: Temp:  [98.2 F (36.8 C)-99 F (37.2 C)] 98.6 F (37 C) (03/27 0408) Pulse Rate:  [70-85] 79 (03/27 0408) Resp:  [18-23] 20 (03/27 0408) BP: (155-172)/(84-115) 157/88 (03/27 0408) SpO2:  [90 %-98 %] 93 % (03/27 0408) Weight:  [102.1 kg] 102.1 kg (03/26 1700) Last BM Date: 01/16/21  Intake/Output from previous day: 03/26 0701 - 03/27 0700 In: 1180 [P.O.:1180] Out: 302 [Urine:300; Stool:2] Intake/Output this shift: No intake/output data recorded.  Exam: Awake and alert Abdomen soft, minimally tender Wound probed with a q-tip and I could not get into any deep collection. No erythema, purulence or odor.  Wound packed.  Lab Results:  Recent Labs    01/16/21 1420 01/17/21 0543  WBC 9.4 8.1  HGB 10.0* 9.6*  HCT 32.2* 30.5*  PLT 288 251   BMET Recent Labs    01/16/21 1043  NA 140  K 2.9*  CL 103  CO2 25  GLUCOSE 122*  BUN 9  CREATININE 0.39*  CALCIUM 8.8*   PT/INR No results for input(s): LABPROT, INR in the last 72 hours. ABG No results for input(s): PHART, HCO3 in the last 72 hours.  Invalid input(s): PCO2, PO2  Studies/Results: CT ABDOMEN PELVIS W CONTRAST  Result Date: 01/16/2021 CLINICAL DATA:  Surgery 5 weeks ago. Nausea, vomiting, and diarrhea for 6 weeks, worsened this morning. Incisional drainage of yellow fluid starting this morning. Appendiceal cancer with pseudomyxoma peritonei, chemotherapy, pulmonary embolism post IVC filter placement, fibromyalgia EXAM: CT ABDOMEN AND PELVIS WITH CONTRAST TECHNIQUE: Multidetector CT imaging of the abdomen and pelvis was performed using the standard protocol following bolus administration of intravenous contrast. Sagittal and coronal MPR images reconstructed from axial data set. CONTRAST:  173mL OMNIPAQUE IOHEXOL 300 MG/ML SOLN IV. No oral contrast administered.  COMPARISON:  10/28/2020 FINDINGS: Lower chest: Subsegmental atelectasis versus scarring lateral RIGHT lower lobe unchanged. Small posterior LEFT diaphragmatic hernia containing fat. Hepatobiliary: Nodular contour of the liver without discrete mass. Post cholecystectomy. Pancreas: Atrophic pancreas Spleen: Post splenectomy Adrenals/Urinary Tract: Unremarkable adrenal glands. Kidneys, ureters, and bladder normal appearance Stomach/Bowel: Anastomotic staple line at rectum within additional anastomotic staple lines in the RIGHT pelvis and central mid abdomen. Diffuse colonic wall thickening. Stomach decompressed. Small bowel loops demonstrate mild scattered wall thickening as well. No evidence of obstruction. Vascular/Lymphatic: IVC filter, with penetration of the IVC wall by multiple limbs. Vascular structures patent. No adenopathy. Reproductive: Uterus surgically absent.  Ovaries nonvisualized. Other: Scattered infiltrative changes of mesenteric and abdominal fat which may be related to interval surgery, though cannot exclude involvement by peritoneal tumor. Gas and fluid collection identified within anterior abdominal wall ventral surgical wound measuring 3.2 x 1.8 cm in greatest axial dimensions and extending 13 cm in length, concerning for wound infection. Additional loculated fluid collection in the pelvis just superior and anterior to the urinary bladder measuring 5.5 x 2.6 x 3.8 cm, partially enhancing margins, sterile versus infected postoperative collection. Small amount of free air at anterior abdomen extending into the surgical wound. Musculoskeletal: Prior lumbosacral fusion. Scattered degenerative changes of thoracic and lumbar spine. IMPRESSION: Gas and fluid collection within the anterior abdominal wall ventral surgical wound measuring 3.2 x 1.8 x 13 cmconcerning for wound infection. Additional loculated fluid collection in the pelvis just superior and anterior to the urinary bladder measuring 5.5 x 2.6  x 3.8 cm, question sterile versus infected postoperative collection.  Scattered infiltrative changes of mesenteric and abdominal fat which may be related to interval surgery, though cannot exclude involvement by peritoneal tumor. Small amount of free air extending into the surgical wound. Diffuse colonic and small bowel wall thickening which may be related to recent abdominal/intestinal surgery though enteritis not excluded Nodular contour of the liver question cirrhosis. Small posterior LEFT diaphragmatic hernia containing fat. Aortic Atherosclerosis (ICD10-I70.0). Electronically Signed   By: Lavonia Dana M.D.   On: 01/16/2021 13:40    Anti-infectives: Anti-infectives (From admission, onward)   Start     Dose/Rate Route Frequency Ordered Stop   01/17/21 0600  vancomycin (VANCOREADY) IVPB 1500 mg/300 mL        1,500 mg 150 mL/hr over 120 Minutes Intravenous Every 24 hours 01/16/21 1732     01/16/21 2200  metroNIDAZOLE (FLAGYL) IVPB 500 mg        500 mg 100 mL/hr over 60 Minutes Intravenous Every 8 hours 01/16/21 1622     01/16/21 2000  vancomycin (VANCOCIN) 50 mg/mL oral solution 125 mg        125 mg Oral 4 times daily 01/16/21 1805 01/26/21 2159   01/16/21 1800  ceFEPIme (MAXIPIME) 2 g in sodium chloride 0.9 % 100 mL IVPB        2 g 200 mL/hr over 30 Minutes Intravenous Every 8 hours 01/16/21 1728     01/16/21 1545  vancomycin (VANCOCIN) 50 mg/mL oral solution 125 mg  Status:  Discontinued        125 mg Oral 4 times daily 01/16/21 1539 01/16/21 1622   01/16/21 1445  metroNIDAZOLE (FLAGYL) IVPB 500 mg        500 mg 100 mL/hr over 60 Minutes Intravenous  Once 01/16/21 1438 01/16/21 1622   01/16/21 1330  vancomycin (VANCOCIN) IVPB 1000 mg/200 mL premix        1,000 mg 200 mL/hr over 60 Minutes Intravenous  Once 01/16/21 1320 01/16/21 1538      Assessment/Plan: Postop abdominal wall fluid collection and deep pelvic collection status post surgery in [facility] for pseudomyxoma peritonei. C.  difficile colitis  Clinically, the collection still seem to be postoperative and do not seem to be infectious.  Her diarrhea is improving so we will continue the current course. If she still continues to feel pressure, we may have IR evaluate to see if any fluid collections can be aspirated.  LOS: 1 day    Coralie Keens 01/17/2021

## 2021-01-18 DIAGNOSIS — C181 Malignant neoplasm of appendix: Secondary | ICD-10-CM

## 2021-01-18 DIAGNOSIS — R109 Unspecified abdominal pain: Secondary | ICD-10-CM

## 2021-01-18 DIAGNOSIS — C772 Secondary and unspecified malignant neoplasm of intra-abdominal lymph nodes: Secondary | ICD-10-CM

## 2021-01-18 DIAGNOSIS — R197 Diarrhea, unspecified: Secondary | ICD-10-CM

## 2021-01-18 DIAGNOSIS — T8149XA Infection following a procedure, other surgical site, initial encounter: Secondary | ICD-10-CM | POA: Diagnosis not present

## 2021-01-18 DIAGNOSIS — A0472 Enterocolitis due to Clostridium difficile, not specified as recurrent: Secondary | ICD-10-CM

## 2021-01-18 DIAGNOSIS — Z79899 Other long term (current) drug therapy: Secondary | ICD-10-CM

## 2021-01-18 DIAGNOSIS — Z8509 Personal history of malignant neoplasm of other digestive organs: Secondary | ICD-10-CM

## 2021-01-18 LAB — COMPREHENSIVE METABOLIC PANEL
ALT: 11 U/L (ref 0–44)
AST: 22 U/L (ref 15–41)
Albumin: 2 g/dL — ABNORMAL LOW (ref 3.5–5.0)
Alkaline Phosphatase: 142 U/L — ABNORMAL HIGH (ref 38–126)
Anion gap: 7 (ref 5–15)
BUN: 8 mg/dL (ref 6–20)
CO2: 25 mmol/L (ref 22–32)
Calcium: 8.3 mg/dL — ABNORMAL LOW (ref 8.9–10.3)
Chloride: 108 mmol/L (ref 98–111)
Creatinine, Ser: 0.51 mg/dL (ref 0.44–1.00)
GFR, Estimated: >60 mL/min (ref >60)
Glucose, Bld: 110 mg/dL — ABNORMAL HIGH (ref 70–99)
Potassium: 2.9 mmol/L — ABNORMAL LOW (ref 3.5–5.1)
Sodium: 140 mmol/L (ref 135–145)
Total Bilirubin: 0.5 mg/dL (ref 0.3–1.2)
Total Protein: 5.8 g/dL — ABNORMAL LOW (ref 6.5–8.1)

## 2021-01-18 LAB — CBC WITH DIFFERENTIAL/PLATELET
Abs Immature Granulocytes: 0.06 10*3/uL (ref 0.00–0.07)
Basophils Absolute: 0.1 10*3/uL (ref 0.0–0.1)
Basophils Relative: 1 %
Eosinophils Absolute: 0.4 10*3/uL (ref 0.0–0.5)
Eosinophils Relative: 6 %
HCT: 29.5 % — ABNORMAL LOW (ref 36.0–46.0)
Hemoglobin: 9.5 g/dL — ABNORMAL LOW (ref 12.0–15.0)
Immature Granulocytes: 1 %
Lymphocytes Relative: 33 %
Lymphs Abs: 2.7 10*3/uL (ref 0.7–4.0)
MCH: 31.8 pg (ref 26.0–34.0)
MCHC: 32.2 g/dL (ref 30.0–36.0)
MCV: 98.7 fL (ref 80.0–100.0)
Monocytes Absolute: 1 10*3/uL (ref 0.1–1.0)
Monocytes Relative: 12 %
Neutro Abs: 3.9 10*3/uL (ref 1.7–7.7)
Neutrophils Relative %: 47 %
Platelets: 247 10*3/uL (ref 150–400)
RBC: 2.99 MIL/uL — ABNORMAL LOW (ref 3.87–5.11)
RDW: 21.3 % — ABNORMAL HIGH (ref 11.5–15.5)
WBC: 8.1 10*3/uL (ref 4.0–10.5)
nRBC: 0 % (ref 0.0–0.2)

## 2021-01-18 LAB — MAGNESIUM: Magnesium: 1.7 mg/dL (ref 1.7–2.4)

## 2021-01-18 LAB — PHOSPHORUS: Phosphorus: 2.7 mg/dL (ref 2.5–4.6)

## 2021-01-18 MED ORDER — ADULT MULTIVITAMIN W/MINERALS CH
1.0000 | ORAL_TABLET | Freq: Every day | ORAL | Status: DC
  Administered 2021-01-18 – 2021-02-10 (×24): 1 via ORAL
  Filled 2021-01-18 (×23): qty 1

## 2021-01-18 MED ORDER — ENSURE MAX PROTEIN PO LIQD
11.0000 [oz_av] | Freq: Two times a day (BID) | ORAL | Status: DC
  Administered 2021-01-18 – 2021-01-21 (×4): 11 [oz_av] via ORAL
  Filled 2021-01-18 (×13): qty 330

## 2021-01-18 MED ORDER — POTASSIUM CHLORIDE CRYS ER 20 MEQ PO TBCR
40.0000 meq | EXTENDED_RELEASE_TABLET | ORAL | Status: AC
  Administered 2021-01-18 (×2): 40 meq via ORAL
  Filled 2021-01-18 (×2): qty 2

## 2021-01-18 MED ORDER — JUVEN PO PACK
1.0000 | PACK | Freq: Two times a day (BID) | ORAL | Status: DC
  Administered 2021-01-21: 1 via ORAL
  Filled 2021-01-18 (×14): qty 1

## 2021-01-18 NOTE — Progress Notes (Signed)
PROGRESS NOTE    Wendy Barber  KWI:[id number] DOB: 1961/05/17 DOA: 01/16/2021 PCP: [place]on Axe, MD   Chief Complaint  Patient presents with  . Diarrhea  . Wound Check    Brief Narrative:  This is Wendy Barber 60 year old female with Roald Lukacs past medical history of metastatic appendiceal cancer, pseudomyxoma peritonei and peritoneal carcinomatosis s/p CRS/HIPEC and splenectomy in 2005 and recently CRS/HIPEC on 12/10/2020 for recurrent cancer, autoimmune hepatitis, DVT and PE with IVC filter currently on Lovenox, hypertension, diabetes who presented to the ED with multiple complaints including intractable diarrhea since her procedure, fatigue and wound drainage this [name].m. Since her hospitalization in February she has had persistent diarrhea but tested negative for C. difficile and was started on Imodium and Lomotil with some improvement.  She was recently seen by her oncologist in [place] [employer], [place] on 3/16 and patient states that she started antibiotics at that time but is unsure what medication.  Says that her diarrhea has since worsened. Yesterday she started feeling very fatigued with decreased appetite and increasing pain and this morning she went to the bathroom and had an area of her incision open with [name] significant amount of purulent foul-smelling drainage.  She denies any fever, chills, chest pain.  Has chronic nausea which she takes Zofran multiple times daily for.  Of note, patient reports that she is up-to-date with her vaccines in regards to her splenectomy.  Regarding her recent surgery: She is s/p cytoreductive surgery/HIPEC 12/10/2020 at [facility] in [facility] with major reduction of small bowel, proximal jejunum, ascending colon, partial cystectomy.  Postoperative course was notable for anastomotic leak and she was taken back to the OR on POD #10 and closure of abdominal wound on POD #13.  She had Kristee Angus JP drain which was recently removed on 3/16.  She was admitted for C diff colitis and  concern for post op wound infection.    Assessment & Plan:   Principal Problem:   Surgical wound infection Active Problems:   Autoimmune hepatitis ([place])   DM2 (diabetes mellitus, type 2) ([place])   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)   DVT of deep femoral vein, left (HCC)   Pulmonary embolism, bilateral (HCC)   Presence of IVC filter   C. difficile enteritis  1. Abdominal post surgical wound infection Ebert Forrester. Afebrile and hemodynamically stable on room air without leukocytosis   b. s/p cytoreductive surgery/HIPEC 12/10/2020 at [facility] in [place], [place] with major reduction of small bowel, proximal jejunum, ascending colon, partial cystectomy.  Postoperative course was notable for anastomotic leak and she was taken back to the OR on POD #10 and closure of abdominal wound on POD #13.  She had [name] JP drain which was recently removed on 3/16. c. CT reveals: Anterior abdominal wall ventral surgical wound collection three 3.2 x 1.8 x 13 cm concerning for infection as well as additional loculated fluid collection in the pelvis superior and anterior to the urinary bladder measuring 5.5 x 2.6 x 3.8 cm d. Patient is asplenic e. Appreciate surgery recommendations -> suspected superficial wound seroma in abdominal wall, recommended wound packing daily.  Suspects fluid collection in sterile fluid collection is sterile.  f. Aerobic cx pending 3/26 - pending g. Afebrile, normal WBC count - in setting of suspected post op fluid collections - will hold abx for now - especially in setting of c diff below h. Per surgery, may need IR evaluation to see if any fluid collections can be aspirated i. Continue pain  management - prioritize PO meds j. IV fluids k. WOCN l. General surgery consulted  2. C. difficile enteritis Aleks Nawrot. Continued diarrhea b. P.o. vancomycin c. Hold home Imodium and Lomotil  3. Metastatic appendiceal cancer, pseudomyxoma peritonei and peritoneal  carcinomatosis s/p CRS/HIPEC and splenectomy in 2005 and recently CRS/HIPEC on 12/10/2020 for recurrent cancer [name]. Will notify oncology   4. History of DVT and PE s/p IVC filter [name]. Continue home prophylactic Lovenox  5. Hypertension [name]. Amlodipine and propanolol   6. Essential Tremor 1. Propanolol   7. Anxiety [name]. Continue home BuSpar  7. Type 2 diabetes [name]. Glucose 122 and has not been taking her insulin at home b. Check HbA1c 5.8 *prediabets range c. Control with diet for now  8. Autoimmune hepatitis [name]. Holding home azathioprine  9. Hypokalemia [name]. Likely from GI losses b. Replace and follow  DVT prophylaxis: lovenox Code Status: full  Family Communication: daughter at bedside Disposition:   Status is: Inpatient  Remains inpatient appropriate because:Inpatient level of care appropriate due to severity of illness   Dispo: The patient is from: Home              Anticipated d/c is to: Home              Patient currently is not medically stable to d/c.   Difficult to place patient No       Consultants:   surgery  Procedures: none  Antimicrobials: Anti-infectives (From admission, onward)   Start     Dose/Rate Route Frequency Ordered Stop   01/17/21 0600  vancomycin (VANCOREADY) IVPB 1500 mg/300 mL  Status:  Discontinued        1,500 mg 150 mL/hr over 120 Minutes Intravenous Every 24 hours 01/16/21 1732 01/17/21 1216   01/16/21 2200  metroNIDAZOLE (FLAGYL) IVPB 500 mg  Status:  Discontinued        500 mg 100 mL/hr over 60 Minutes Intravenous Every 8 hours 01/16/21 1622 01/17/21 1216   01/16/21 2000  vancomycin (VANCOCIN) 50 mg/mL oral solution 125 mg        125 mg Oral 4 times daily 01/16/21 1805 01/26/21 2159   01/16/21 1800  ceFEPIme (MAXIPIME) 2 g in sodium chloride 0.9 % 100 mL IVPB  Status:  Discontinued        2 g 200 mL/hr over 30 Minutes Intravenous Every 8 hours 01/16/21 1728 01/17/21 1216   01/16/21 1545  vancomycin (VANCOCIN) 50 mg/mL oral  solution 125 mg  Status:  Discontinued        125 mg Oral 4 times daily 01/16/21 1539 01/16/21 1622   01/16/21 1445  metroNIDAZOLE (FLAGYL) IVPB 500 mg        500 mg 100 mL/hr over 60 Minutes Intravenous  Once 01/16/21 1438 01/16/21 1622   01/16/21 1330  vancomycin (VANCOCIN) IVPB 1000 mg/200 mL premix        1,000 mg 200 mL/hr over 60 Minutes Intravenous  Once 01/16/21 1320 01/16/21 1538         Subjective: IV dilaudid working better, but sometimes has to wait  Objective: Vitals:   01/17/21 2209 01/17/21 2211 01/18/21 0420 01/18/21 1353  BP: (!) 155/86  (!) 148/87 (!) 187/109  Pulse: 86  70 77  Resp: 18  16 18   Temp:  98.7 F (37.1 C) 98.2 F (36.8 C) 98 F (36.7 C)  TempSrc:  Oral Oral Oral  SpO2: 94%  94% 95%  Weight:      Height:  Intake/Output Summary (Last 24 hours) at 01/18/2021 1616 Last data filed at 01/18/2021 0841 Gross per 24 hour  Intake 480 ml  Output 700 ml  Net -220 ml   Filed Weights   01/16/21 1700  Weight: 102.1 kg    Examination:  General: No acute distress. Cardiovascular: Heart sounds show Emmanuella Mirante regular rate, and rhythm Lungs: Clear to auscultation bilaterally  Abdomen: surgical scar, dehiscence, packed without surrounding redness Neurological: Alert and oriented 3. Moves all extremities 4 . Cranial nerves II through XII grossly intact. Skin: Warm and dry. No rashes or lesions. Extremities: No clubbing or cyanosis. No edema  Data Reviewed: I have personally reviewed following labs and imaging studies  CBC: Recent Labs  Lab 01/16/21 1420 01/17/21 0543 01/18/21 0456  WBC 9.4 8.1 8.1  NEUTROABS 4.8  --  3.9  HGB 10.0* 9.6* 9.5*  HCT 32.2* 30.5* 29.5*  MCV 99.7 100.3* 98.7  PLT 288 251 355    Basic Metabolic Panel: Recent Labs  Lab 01/16/21 1043 01/16/21 1420 01/17/21 0543 01/18/21 0456  NA 140  --  138 140  K 2.9*  --  3.1* 2.9*  CL 103  --  106 108  CO2 25  --  23 25  GLUCOSE 122*  --  116* 110*  BUN 9  --  8 8   CREATININE 0.39*  --  0.53 0.51  CALCIUM 8.8*  --  8.2* 8.3*  MG  --  1.9  --  1.7  PHOS  --   --   --  2.7    GFR: Estimated Creatinine Clearance: 92.4 mL/min (by C-G formula based on SCr of 0.51 mg/dL).  Liver Function Tests: Recent Labs  Lab 01/16/21 1043 01/18/21 0456  AST 28 22  ALT 14 11  ALKPHOS 185* 142*  BILITOT 1.1 0.5  PROT 7.3 5.8*  ALBUMIN 2.6* 2.0*    CBG: No results for input(s): GLUCAP in the last 168 hours.   Recent Results (from the past 240 hour(s))  Blood culture (routine x 2)     Status: None (Preliminary result)   Collection Time: 01/16/21 11:14 AM   Specimen: BLOOD RIGHT HAND  Result Value Ref Range Status   Specimen Description   Final    BLOOD RIGHT HAND Performed at [facility], [place] [address], [place], [place] [address]    Special Requests   Final    BOTTLES DRAWN AEROBIC AND ANAEROBIC Blood Culture results may not be optimal due to an inadequate volume of blood received in culture bottles Performed at [facility] [address], [place], [place] [address]    Culture   Final    NO GROWTH 2 DAYS Performed at [facility] [address], [place], [place] [address]    Report Status PENDING  Incomplete  Gastrointestinal Panel by PCR , Stool     Status: None   Collection Time: 01/16/21 11:28 AM   Specimen: Stool  Result Value Ref Range Status   Campylobacter species NOT DETECTED NOT DETECTED Final   Plesimonas shigelloides NOT DETECTED NOT DETECTED Final   Salmonella species NOT DETECTED NOT DETECTED Final   Yersinia enterocolitica NOT DETECTED NOT DETECTED Final   Vibrio species NOT DETECTED NOT DETECTED Final   Vibrio cholerae NOT DETECTED NOT DETECTED Final   Enteroaggregative E coli (EAEC) NOT DETECTED NOT DETECTED Final   Enteropathogenic E coli (EPEC) NOT DETECTED NOT DETECTED Final   Enterotoxigenic E coli (ETEC) NOT DETECTED NOT  DETECTED Final   Shiga like toxin producing E coli (STEC) NOT  DETECTED NOT DETECTED Final   Shigella/Enteroinvasive E coli (EIEC) NOT DETECTED NOT DETECTED Final   Cryptosporidium NOT DETECTED NOT DETECTED Final   Cyclospora cayetanensis NOT DETECTED NOT DETECTED Final   Entamoeba histolytica NOT DETECTED NOT DETECTED Final   Giardia lamblia NOT DETECTED NOT DETECTED Final   Adenovirus F40/41 NOT DETECTED NOT DETECTED Final   Astrovirus NOT DETECTED NOT DETECTED Final   Norovirus GI/GII NOT DETECTED NOT DETECTED Final   Rotavirus Yuan Gann NOT DETECTED NOT DETECTED Final   Sapovirus (I, II, IV, and V) NOT DETECTED NOT DETECTED Final    Comment: Performed at [facility], [address] [place], [place] [address]  C Difficile Quick Screen w PCR reflex     Status: Abnormal   Collection Time: 01/16/21 11:28 AM   Specimen: Stool  Result Value Ref Range Status   C Diff antigen POSITIVE (Jamyson Jirak) NEGATIVE Final   C Diff toxin NEGATIVE NEGATIVE Final   C Diff interpretation Results are indeterminate. See PCR results.  Final    Comment: Performed at [facility], [place] [address], [place], [place] [address]  C. Diff by PCR, Reflexed     Status: Abnormal   Collection Time: 01/16/21 11:28 AM  Result Value Ref Range Status   Toxigenic C. Difficile by PCR POSITIVE (Tauri Ethington) NEGATIVE Final    Comment: Positive for toxigenic C. difficile with little to no toxin production. Only treat if clinical presentation suggests symptomatic illness. Performed at [facility], [place] [address], [place], [place] [address]   Blood culture (routine x 2)     Status: None (Preliminary result)   Collection Time: 01/16/21  2:19 PM   Specimen: BLOOD LEFT ARM  Result Value Ref Range Status   Specimen Description   Final    BLOOD LEFT ARM Performed at [facility] [address] [place], [place] [address]    Special Requests   Final    BOTTLES DRAWN AEROBIC AND ANAEROBIC Blood Culture adequate volume Performed at [facility] [address] [place], [place] [address]    Culture   Final    NO GROWTH 2 DAYS Performed at [facility] [address] [place], [place] [address]    Report Status PENDING  Incomplete  Aerobic Culture w Gram Stain (superficial specimen)     Status: None (Preliminary result)   Collection Time: 01/16/21  2:20 PM   Specimen: Abdomen; Wound  Result Value Ref Range Status   Specimen Description   Final    ABDOMEN Performed at [facility] [address] [place], [place] [address]    Special Requests   Final    NONE Performed at [facility], [place] [address], [place], [place] [address]    Gram Stain   Final    FEW WBC PRESENT, PREDOMINANTLY PMN NO ORGANISMS SEEN    Culture   Final    CULTURE REINCUBATED FOR BETTER GROWTH Performed at [facility], [place] [address] [place],  [address]    Report Status PENDING  Incomplete  Resp Panel by RT-PCR (Flu Ridhi Hoffert&B, Covid) Nasopharyngeal Swab     Status: None   Collection Time: 01/16/21  3:47 PM   Specimen: Nasopharyngeal Swab; Nasopharyngeal(NP) swabs in vial transport medium  Result Value Ref Range Status   SARS Coronavirus 2 by RT PCR NEGATIVE NEGATIVE Final  Comment: (NOTE) SARS-CoV-2 target nucleic acids are NOT DETECTED.  The SARS-CoV-2 RNA is generally detectable in upper respiratory specimens during the acute phase of infection. The lowest concentration of SARS-CoV-2 viral copies this assay can detect is 138 copies/mL. Joseantonio Dittmar negative result does not preclude SARS-Cov-2 infection and should not be used as the sole basis for treatment or other patient management decisions. Remi Lopata negative result may occur with  improper specimen collection/handling, submission of specimen other than nasopharyngeal swab, presence of viral mutation(s) within the areas targeted by this assay, and inadequate number of viral copies(<138 copies/mL). Eithan Beagle negative result must be combined with clinical  observations, patient history, and epidemiological information. The expected result is Negative.  Fact Sheet for Patients:  EntrepreneurPulse.com.au  Fact Sheet for Healthcare Providers:  IncredibleEmployment.be  This test is no t yet approved or cleared by the [place] FDA and  has been authorized for detection and/or diagnosis of SARS-CoV-2 by FDA under an Emergency Use Authorization (EUA). This EUA will remain  in effect (meaning this test can be used) for the duration of the COVID-19 declaration under Section 564(b)(1) of the Act, 21 U.S.C.section 360bbb-3(b)(1), unless the authorization is terminated  or revoked sooner.       Influenza Emerson Barretto by PCR NEGATIVE NEGATIVE Final   Influenza B by PCR NEGATIVE NEGATIVE Final    Comment: (NOTE) The Xpert Xpress SARS-CoV-2/FLU/RSV plus assay is intended as an aid in the diagnosis of influenza from Nasopharyngeal swab specimens and should not be used as Blase Beckner sole basis for treatment. Nasal washings and aspirates are unacceptable for Xpert Xpress SARS-CoV-2/FLU/RSV testing.  Fact Sheet for Patients: EntrepreneurPulse.com.au  Fact Sheet for Healthcare Providers: IncredibleEmployment.be  This test is not yet approved or cleared by the [place] FDA and has been authorized for detection and/or diagnosis of SARS-CoV-2 by FDA under an Emergency Use Authorization (EUA). This EUA will remain in effect (meaning this test can be used) for the duration of the COVID-19 declaration under Section 564(b)(1) of the Act, 21 U.S.C. section 360bbb-3(b)(1), unless the authorization is terminated or revoked.  Performed at [facility], [place] [address], [place], [place] [address]          Radiology Studies: No results found.      Scheduled Meds: . acetaminophen  1,000 mg Oral Q8H  . amLODipine  5 mg Oral Daily  . busPIRone  5 mg Oral Q breakfast    And  . busPIRone  5 mg Oral Q1200   And  . busPIRone  10 mg Oral QHS  . enoxaparin  40 mg Subcutaneous Q24H  . multivitamin with minerals  1 tablet Oral Daily  . nutrition supplement (JUVEN)  1 packet Oral BID WC  . pantoprazole  40 mg Oral q morning  . propranolol  20 mg Oral BID  . Ensure Max Protein  11 oz Oral BID  . sodium chloride flush  3 mL Intravenous Q12H  . vancomycin  125 mg Oral QID   Continuous Infusions:    LOS: 2 days    Time spent: over 30 min    Fayrene Helper, MD [facility]   To contact the attending provider between 7A-7P or the covering provider during after hours 7P-7A, please log into the web site [email or web address] and access using universal [place] password for that web site. If you do not have the password, please call the hospital operator.  01/18/2021, 4:16 PM

## 2021-01-18 NOTE — Progress Notes (Signed)
Subjective/Chief Complaint: Still having the pressure sensation in the abdomen, but it is better and controlled currently with pain medication.  Has had diarrhea about once per hour, denies any blood in the stool.  Only gets nauseated when the pain is severe.  Emesis.   Objective: Vital signs in last 24 hours: Temp:  [98.2 F (36.8 C)-98.7 F (37.1 C)] 98.2 F (36.8 C) (03/28 0420) Pulse Rate:  [70-91] 70 (03/28 0420) Resp:  [16-18] 16 (03/28 0420) BP: (148-177)/(84-87) 148/87 (03/28 0420) SpO2:  [92 %-94 %] 94 % (03/28 0420) Last BM Date: 01/17/21  Intake/Output from previous day: 03/27 0701 - 03/28 0700 In: 723 [P.O.:720; I.V.:3] Out: 700 [Urine:700] Intake/Output this shift: Total I/O In: 240 [P.O.:240] Out: -   Exam: Awake and alert Abdomen soft, minimally tender 1 cm opening at the lower aspect of her previous incision with healthy-appearing granulation tissue, small amount of serous appearing drainage.  Repacked.  Lab Results:  Recent Labs    01/17/21 0543 01/18/21 0456  WBC 8.1 8.1  HGB 9.6* 9.5*  HCT 30.5* 29.5*  PLT 251 247   BMET Recent Labs    01/17/21 0543 01/18/21 0456  NA 138 140  K 3.1* 2.9*  CL 106 108  CO2 23 25  GLUCOSE 116* 110*  BUN 8 8  CREATININE 0.53 0.51  CALCIUM 8.2* 8.3*   PT/INR No results for input(s): LABPROT, INR in the last 72 hours. ABG No results for input(s): PHART, HCO3 in the last 72 hours.  Invalid input(s): PCO2, PO2  Studies/Results: CT ABDOMEN PELVIS W CONTRAST  Result Date: 01/16/2021 CLINICAL DATA:  Surgery 5 weeks ago. Nausea, vomiting, and diarrhea for 6 weeks, worsened this morning. Incisional drainage of yellow fluid starting this morning. Appendiceal cancer with pseudomyxoma peritonei, chemotherapy, pulmonary embolism post IVC filter placement, fibromyalgia EXAM: CT ABDOMEN AND PELVIS WITH CONTRAST TECHNIQUE: Multidetector CT imaging of the abdomen and pelvis was performed using the standard protocol  following bolus administration of intravenous contrast. Sagittal and coronal MPR images reconstructed from axial data set. CONTRAST:  160mL OMNIPAQUE IOHEXOL 300 MG/ML SOLN IV. No oral contrast administered. COMPARISON:  10/28/2020 FINDINGS: Lower chest: Subsegmental atelectasis versus scarring lateral RIGHT lower lobe unchanged. Small posterior LEFT diaphragmatic hernia containing fat. Hepatobiliary: Nodular contour of the liver without discrete mass. Post cholecystectomy. Pancreas: Atrophic pancreas Spleen: Post splenectomy Adrenals/Urinary Tract: Unremarkable adrenal glands. Kidneys, ureters, and bladder normal appearance Stomach/Bowel: Anastomotic staple line at rectum within additional anastomotic staple lines in the RIGHT pelvis and central mid abdomen. Diffuse colonic wall thickening. Stomach decompressed. Small bowel loops demonstrate mild scattered wall thickening as well. No evidence of obstruction. Vascular/Lymphatic: IVC filter, with penetration of the IVC wall by multiple limbs. Vascular structures patent. No adenopathy. Reproductive: Uterus surgically absent.  Ovaries nonvisualized. Other: Scattered infiltrative changes of mesenteric and abdominal fat which may be related to interval surgery, though cannot exclude involvement by peritoneal tumor. Gas and fluid collection identified within anterior abdominal wall ventral surgical wound measuring 3.2 x 1.8 cm in greatest axial dimensions and extending 13 cm in length, concerning for wound infection. Additional loculated fluid collection in the pelvis just superior and anterior to the urinary bladder measuring 5.5 x 2.6 x 3.8 cm, partially enhancing margins, sterile versus infected postoperative collection. Small amount of free air at anterior abdomen extending into the surgical wound. Musculoskeletal: Prior lumbosacral fusion. Scattered degenerative changes of thoracic and lumbar spine. IMPRESSION: Gas and fluid collection within the anterior abdominal  wall ventral  surgical wound measuring 3.2 x 1.8 x 13 cmconcerning for wound infection. Additional loculated fluid collection in the pelvis just superior and anterior to the urinary bladder measuring 5.5 x 2.6 x 3.8 cm, question sterile versus infected postoperative collection. Scattered infiltrative changes of mesenteric and abdominal fat which may be related to interval surgery, though cannot exclude involvement by peritoneal tumor. Small amount of free air extending into the surgical wound. Diffuse colonic and small bowel wall thickening which may be related to recent abdominal/intestinal surgery though enteritis not excluded Nodular contour of the liver question cirrhosis. Small posterior LEFT diaphragmatic hernia containing fat. Aortic Atherosclerosis (ICD10-I70.0). Electronically Signed   By: Lavonia Dana M.D.   On: 01/16/2021 13:40    Anti-infectives: Anti-infectives (From admission, onward)   Start     Dose/Rate Route Frequency Ordered Stop   01/17/21 0600  vancomycin (VANCOREADY) IVPB 1500 mg/300 mL  Status:  Discontinued        1,500 mg 150 mL/hr over 120 Minutes Intravenous Every 24 hours 01/16/21 1732 01/17/21 1216   01/16/21 2200  metroNIDAZOLE (FLAGYL) IVPB 500 mg  Status:  Discontinued        500 mg 100 mL/hr over 60 Minutes Intravenous Every 8 hours 01/16/21 1622 01/17/21 1216   01/16/21 2000  vancomycin (VANCOCIN) 50 mg/mL oral solution 125 mg        125 mg Oral 4 times daily 01/16/21 1805 01/26/21 2159   01/16/21 1800  ceFEPIme (MAXIPIME) 2 g in sodium chloride 0.9 % 100 mL IVPB  Status:  Discontinued        2 g 200 mL/hr over 30 Minutes Intravenous Every 8 hours 01/16/21 1728 01/17/21 1216   01/16/21 1545  vancomycin (VANCOCIN) 50 mg/mL oral solution 125 mg  Status:  Discontinued        125 mg Oral 4 times daily 01/16/21 1539 01/16/21 1622   01/16/21 1445  metroNIDAZOLE (FLAGYL) IVPB 500 mg        500 mg 100 mL/hr over 60 Minutes Intravenous  Once 01/16/21 1438 01/16/21 1622    01/16/21 1330  vancomycin (VANCOCIN) IVPB 1000 mg/200 mL premix        1,000 mg 200 mL/hr over 60 Minutes Intravenous  Once 01/16/21 1320 01/16/21 1538      Assessment/Plan: Postop abdominal wall fluid collection and deep pelvic collection status post surgery in [place] (second round of HIPEC and debulking, complicated by enterotomy requiring return to the OR on postop day 4 and then again for closure of open midline wound) for pseudomyxoma peritonei. C. difficile colitis  Clinically there is not seem to be any infectious process going on aside from the C. difficile colitis.  Continue treatment for this.  If continuing to experience pressure in the lower abdomen can consider consulting IR to drain the pelvic collection, but at this point would avoid intervening given complex history and no evidence this is infected.  I would also pose the question of whether these fluid collections could represent mucinous fluid    LOS: 2 days    Wendy Barber 01/18/2021

## 2021-01-18 NOTE — Consult Note (Signed)
Referral MD  Reason for Referral: C. difficile diarrhea; open abdominal wound; status post HIPEC surgery for recurrent appendiceal cancer  Chief Complaint  Patient presents with  . Diarrhea  . Wound Check  : I had diarrhea that would not stop  HPI: Ms. Sax is well-known to me.  She is a very nice 60 year old white female.  She has incredibly interesting history.  She had appendiceal carcinoma back about 17 years ago.  This was up in [place].  She underwent, at that time, a relatively new procedure.  She underwent hypothermic intraperitoneal chemotherapy.  She has a tumor debulked.  She has been in remission for about 17 years.  However, she had come back from [place] on a trip.  She has had back pain.  Ultimately, she was noted to have a recurrence.  She went back up to [place].  She was at [facility]  She is being followed by Dr. Sharon Mt.  He went ahead and took it back to surgery on the 16 February.  She underwent another debulking and administration of intraperitoneal chemotherapy.  She unfortunate has had complications from this.  She had an anastomotic leak that required another surgery.  She then had a open abdominal wound that required another surgery.  She finally was able to come back to [place] a couple weeks ago.  Unfortunate, she began having horrible diarrhea.  This would not abate.  She was getting weaker.  She was getting dehydrated.  Pressure ultimately came to the hospital and was admitted on the 26th.  She was found to be positive for C. difficile.  There she also had leakage from an abdominal wound.  She has been seen by general surgery.  The fluid is yellow and clear.  The cultures so far been negative.  She is on oral vancomycin for the C. difficile.  She is not eating all that much.  She still having quite a bit of abdominal pain.  She was started on some Dilaudid up in [place].  When she came in, her potassium was 2.9.  Her calcium was 8.8.  Her albumin was  2.6.  She is on some IV fluids.  Her potassium yesterday was 3.1.  Her blood counts today show white cell count of 8.1.  Hemoglobin 9.5.  Platelet count 247,000.  She is on a cardiac monitor right now.  The diarrhea is getting a little bit better.  Again, surgery is following the abdominal wound.  She has a dressing on the abdominal wound.  She has had no fever.  There has been no bleeding.  She has had a little nausea but no vomiting.  She has had no rashes.  Overall, her performance status is ECOG 1-2.     Past Medical History:  Diagnosis Date  . Arthritis   . Back pain   . Cancer [facility])    pseudomyxoma peritonei  . 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[place]) 03/19/2020  . Chronic fatigue syndrome   . Diabetes mellitus without complication ([place])   . DVT of deep femoral vein, left ([place]) 03/19/2020  . Fibromyalgia   . Goals of care, counseling/discussion 03/19/2020  . Hypertension   . Malignant pseudomyxoma peritonei ([place]) 03/19/2020  . Presence of IVC filter 03/19/2020  . Pulmonary embolism, bilateral ([place]) 03/19/2020  :  Past Surgical History:  Procedure Laterality Date  . ABDOMINAL HYSTERECTOMY    . ABDOMINAL SURGERY    . ACHILLES TENDON REPAIR    . APPENDECTOMY    .  ARTHROPLASTY    . CARPAL TUNNEL RELEASE    . CHOLECYSTECTOMY    . IR US GUIDE BX ASP/DRAIN  11/25/2019  . JOINT REPLACEMENT    . KNEE ARTHROSCOPY    . ORIF ANKLE FRACTURE Right 08/27/2019   Procedure: OPEN REDUCTION INTERNAL FIXATION RIGHT ANKLE FRACTURE;  Surgeon: Renette Butters, MD;  Location: [facility];  Service: Orthopedics;  Laterality: Right;  . perineorrophy    . TONGUE BIOPSY    :   Current Facility-Administered Medications:  .  acetaminophen (TYLENOL) tablet 1,000 mg, 1,000 mg, Oral, Q8H, Elodia Florence., MD, 1,000 mg at 01/18/21 0602 .  amLODipine (NORVASC) tablet 5 mg, 5 mg, Oral, Daily, Florene Glen, A Clint Lipps., MD .  busPIRone (BUSPAR) tablet 5 mg, 5 mg, Oral, Q  breakfast, 5 mg at 01/17/21 0856 **AND** busPIRone (BUSPAR) tablet 5 mg, 5 mg, Oral, Q1200, 5 mg at 01/17/21 1454 **AND** busPIRone (BUSPAR) tablet 10 mg, 10 mg, Oral, QHS, Harold Hedge, MD, 10 mg at 01/17/21 2103 .  diclofenac Sodium (VOLTAREN) 1 % topical gel 2 g, 2 g, Topical, QID PRN, Elodia Florence., MD .  enoxaparin (LOVENOX) injection 40 mg, 40 mg, Subcutaneous, Q24H, Marva Panda E, MD, 40 mg at 01/17/21 1029 .  hydrALAZINE (APRESOLINE) injection 10 mg, 10 mg, Intravenous, Q8H PRN, Harold Hedge, MD .  HYDROmorphone (DILAUDID) injection 1 mg, 1 mg, Intravenous, Q3H PRN, Elodia Florence., MD, 1 mg at 01/18/21 0603 .  HYDROmorphone (DILAUDID) tablet 2 mg, 2 mg, Oral, Q4H PRN, Elodia Florence., MD .  ondansetron [facility]) tablet 4 mg, 4 mg, Oral, Q6H PRN **OR** ondansetron (ZOFRAN) injection 4 mg, 4 mg, Intravenous, Q6H PRN, Harold Hedge, MD, 4 mg at 01/18/21 0602 .  pantoprazole (PROTONIX) EC tablet 40 mg, 40 mg, Oral, q morning, Harold Hedge, MD, 40 mg at 01/17/21 1028 .  propranolol (INDERAL) tablet 20 mg, 20 mg, Oral, BID, Elodia Florence., MD, 20 mg at 01/17/21 2104 .  sodium chloride flush (NS) 0.9 % injection 3 mL, 3 mL, Intravenous, Q12H, Harold Hedge, MD, 3 mL at 01/17/21 1029 .  vancomycin (VANCOCIN) 50 mg/mL oral solution 125 mg, 125 mg, Oral, QID, Harold Hedge, MD, 125 mg at 01/17/21 2115:  . acetaminophen  1,000 mg Oral Q8H  . amLODipine  5 mg Oral Daily  . busPIRone  5 mg Oral Q breakfast   And  . busPIRone  5 mg Oral Q1200   And  . busPIRone  10 mg Oral QHS  . enoxaparin  40 mg Subcutaneous Q24H  . pantoprazole  40 mg Oral q morning  . propranolol  20 mg Oral BID  . sodium chloride flush  3 mL Intravenous Q12H  . vancomycin  125 mg Oral QID  :  Allergies  Allergen Reactions  . Penicillins Shortness Of Breath    Other reaction(s): Irregular Heart Rate, Other (See Comments) Rapid heartrate   . Alprazolam Hives and Other (See  Comments)    Note: tolerates midazolam fine unresponsive Hard to arouse unresponsive Hard to arouse  Other reaction(s): Other (See Comments) unresponsive  . Ativan [Lorazepam] Other (See Comments)    Note: tolerates midazolam fine Face & Throat Swelling.  . Corticosteroids Other (See Comments)    Other reaction(s): Other (see comments) Psychotic behaviour    . Erythromycin     Other reaction(s): Other (See Comments) Severe stomach pain   . Erythromycin Base Hives  .  Milnacipran Hcl Other (See Comments)    Other reaction(s):  psychotic episode     . Prednisone Other (See Comments)    Anxiety & Nervous Breakdown.  Ocie Cornfield  [Milnacipran]   . Povidone Iodine Rash    Had rash under bandage after surgery , has had betadine since without reaction   . Prednisolone Anxiety  :  History reviewed. No pertinent family history.:  Social History   Socioeconomic History  . Marital status: Married    Spouse name: Not on file  . Number of children: 3  . Years of education: Not on file  . Highest education level: Some college, no degree  Occupational History  . Not on file  Tobacco Use  . Smoking status: Never Smoker  . Smokeless tobacco: Never Used  Vaping Use  . Vaping Use: Not on file  Substance and Sexual Activity  . Alcohol use: No  . Drug use: Never  . Sexual activity: Not on file  Other Topics Concern  . Not on file  Social History Narrative   Right handed   One story home   Drinks occasional caffeine   Social Determinants of Health   Financial Resource Strain: Not on file  Food Insecurity: Not on file  Transportation Needs: Not on file  Physical Activity: Not on file  Stress: Not on file  Social Connections: Not on file  Intimate Partner Violence: Not on file  :  Review of Systems  Constitutional: Negative.   HENT: Negative.   Eyes: Negative.   Respiratory: Negative.   Cardiovascular: Negative.   Gastrointestinal: Positive for abdominal pain,  diarrhea and nausea.  Genitourinary: Negative.   Musculoskeletal: Negative.   Skin: Negative.   Neurological: Negative.   Endo/Heme/Allergies: Negative.   Psychiatric/Behavioral: Negative.      Exam:  This is a obese white female in no obvious distress.  Vital signs are temperature 98.2.  Pulse 70.  Blood pressure 148/87.  Head neck exam shows no ocular or oral lesions.  There are no palpable cervical or supraclavicular lymph nodes.  Lungs are clear bilaterally.  Cardiac exam regular rate and rhythm with no murmurs, rubs or bruits.  Abdomen is soft.  She has good bowel sounds.  She has a abdominal dressing over the wound in the hypoumbilical area.  She has some tenderness to palpation in the lower abdomen.  There is no obvious fluid wave.  Her extremity shows no clubbing, cyanosis or edema.  Skin exam shows no rashes, ecchymoses or petechia.  Neurological exam is nonfocal.  Patient Vitals for the past 24 hrs:  BP Temp Temp src Pulse Resp SpO2  01/18/21 0420 (!) 148/87 98.2 F (36.8 C) Oral 70 16 94 %  01/17/21 2211 -- 98.7 F (37.1 C) Oral -- -- --  01/17/21 2209 (!) 155/86 -- -- 86 18 94 %  01/17/21 1412 (!) 177/84 98.6 F (37 C) Oral 91 18 92 %     Recent Labs    01/17/21 0543 01/18/21 0456  WBC 8.1 8.1  HGB 9.6* 9.5*  HCT 30.5* 29.5*  PLT 251 247   Recent Labs    01/16/21 1043 01/17/21 0543  NA 140 138  K 2.9* 3.1*  CL 103 106  CO2 25 23  GLUCOSE 122* 116*  BUN 9 8  CREATININE 0.39* 0.53  CALCIUM 8.8* 8.2*    Blood smear review: None  Pathology: None    Assessment and Plan: Ms. Crothers is a very charming 60 year old white female.  She has a very, very unusual history in which she had appendiceal carcinoma 17 years ago.  She underwent HIPEC for this.  She has been in remission since until recently.  She went back up to [facility] and had another HIPEC procedure.  She has had some complications after the procedure.  Now she has an open abdominal wound and  C. difficile.  I am sure she is on antibiotics up it [facility] which probably led to her having C. difficile.  She did have extensive surgery and resections.  I am sure that diarrhea may always be an issue to some degree because of what I would think would be "short bowel".  I really do not see any issues that need to be addressed from my point of view.  This certainly is a surgical issue with respect to the wound healing and to some degree the C. difficile.  She is on oral vancomycin.  The diarrhea seems to be getting a little bit better.  I am not sure she would need to be on cardiac monitor.  Her IV access is quite precarious.  I will know if she needs to have a PICC line in for better IV access.  Hopefully should be able to heal up this abdominal wound.  I know that she will get outstanding care from all the staff up on 6 E.  [name], MD  Alison Murray 9:9

## 2021-01-18 NOTE — Progress Notes (Signed)
Initial Nutrition Assessment  DOCUMENTATION CODES:   Obesity unspecified  INTERVENTION:   -Ensure MAX Protein po BID, each supplement provides 150 kcal and 30 grams of protein  -Juven Fruit Punch BID, each serving provides 95kcal and 2.5g of protein (amino acids glutamine and arginine)  -Multivitamin with minerals daily  NUTRITION DIAGNOSIS:   Increased nutrient needs related to cancer and cancer related treatments as evidenced by estimated needs.  GOAL:   Patient will meet greater than or equal to 90% of their needs  MONITOR:   PO intake,Supplement acceptance,Labs,Weight trends,I & O's,Skin  REASON FOR ASSESSMENT:   Malnutrition Screening Tool    ASSESSMENT:   60 year old female with a past medical history of metastatic appendiceal cancer, pseudomyxoma peritonei and peritoneal carcinomatosis s/p CRS/HIPEC and splenectomy in 2005 and recently CRS/HIPEC on 12/10/2020 for recurrent cancer, autoimmune hepatitis, DVT and PE with IVC filter currently on Lovenox, hypertension, diabetes who presented to the ED with multiple complaints including intractable diarrhea since her procedure, fatigue and infection wound drainage this a.m.She was admitted for C diff colitis and concern for post op wound infection  Patient with c.diff diarrhea and chronic nausea. Has been having chronic diarrhea for 6 weeks following HIPEC surgery. Pt with open abdominal wound from surgery in February.   Patient consumed 15-50% of meals yesterday. This morning ate 75% of breakfast (eggs, potatoes, bagel).  Will order Ensure Max and Juven supplements to aid in healing from wound and recent surgery.  Per weight records, pt has lost 22 lbs since 07/27/20 (8% wt loss x 3.5-4 months, significant for time frame).  Medications: KLOR-CON, IV Zofran  Labs reviewed: Low K  NUTRITION - FOCUSED PHYSICAL EXAM:  Deferred.  Diet Order:   Diet Order            Diet heart healthy/carb modified Room service  appropriate? Yes; Fluid consistency: Thin  Diet effective now                 EDUCATION NEEDS:   No education needs have been identified at this time  Skin:  Skin Assessment: Skin Integrity Issues: Skin Integrity Issues:: Incisions Incisions: lower abdomen  Last BM:  3/27 -type 7  Height:   Ht Readings from Last 1 Encounters:  01/16/21 5\' 6"  (1.676 m)    Weight:   Wt Readings from Last 1 Encounters:  01/16/21 102.1 kg   BMI:  Body mass index is 36.32 kg/m.  Estimated Nutritional Needs:   Kcal:  2000-2200  Protein:  100-115g  Fluid:  2L/day  Clayton Bibles, MS, RD, LDN Inpatient Clinical Dietitian Contact information available via Amion

## 2021-01-19 DIAGNOSIS — A0472 Enterocolitis due to Clostridium difficile, not specified as recurrent: Secondary | ICD-10-CM | POA: Diagnosis not present

## 2021-01-19 DIAGNOSIS — T8149XA Infection following a procedure, other surgical site, initial encounter: Secondary | ICD-10-CM | POA: Diagnosis not present

## 2021-01-19 DIAGNOSIS — C772 Secondary and unspecified malignant neoplasm of intra-abdominal lymph nodes: Secondary | ICD-10-CM | POA: Diagnosis not present

## 2021-01-19 DIAGNOSIS — C181 Malignant neoplasm of appendix: Secondary | ICD-10-CM | POA: Diagnosis not present

## 2021-01-19 LAB — COMPREHENSIVE METABOLIC PANEL
ALT: 12 U/L (ref 0–44)
AST: 24 U/L (ref 15–41)
Albumin: 2 g/dL — ABNORMAL LOW (ref 3.5–5.0)
Alkaline Phosphatase: 145 U/L — ABNORMAL HIGH (ref 38–126)
Anion gap: 6 (ref 5–15)
BUN: 8 mg/dL (ref 6–20)
CO2: 24 mmol/L (ref 22–32)
Calcium: 8.1 mg/dL — ABNORMAL LOW (ref 8.9–10.3)
Chloride: 108 mmol/L (ref 98–111)
Creatinine, Ser: 0.5 mg/dL (ref 0.44–1.00)
GFR, Estimated: >60 mL/min (ref >60)
Glucose, Bld: 128 mg/dL — ABNORMAL HIGH (ref 70–99)
Potassium: 3.4 mmol/L — ABNORMAL LOW (ref 3.5–5.1)
Sodium: 138 mmol/L (ref 135–145)
Total Bilirubin: 0.4 mg/dL (ref 0.3–1.2)
Total Protein: 5.7 g/dL — ABNORMAL LOW (ref 6.5–8.1)

## 2021-01-19 LAB — CBC WITH DIFFERENTIAL/PLATELET
Abs Immature Granulocytes: 0.04 10*3/uL (ref 0.00–0.07)
Basophils Absolute: 0.1 10*3/uL (ref 0.0–0.1)
Basophils Relative: 1 %
Eosinophils Absolute: 0.4 10*3/uL (ref 0.0–0.5)
Eosinophils Relative: 5 %
HCT: 31.2 % — ABNORMAL LOW (ref 36.0–46.0)
Hemoglobin: 9.5 g/dL — ABNORMAL LOW (ref 12.0–15.0)
Immature Granulocytes: 1 %
Lymphocytes Relative: 36 %
Lymphs Abs: 2.8 10*3/uL (ref 0.7–4.0)
MCH: 31.1 pg (ref 26.0–34.0)
MCHC: 30.4 g/dL (ref 30.0–36.0)
MCV: 102.3 fL — ABNORMAL HIGH (ref 80.0–100.0)
Monocytes Absolute: 0.8 10*3/uL (ref 0.1–1.0)
Monocytes Relative: 10 %
Neutro Abs: 3.7 10*3/uL (ref 1.7–7.7)
Neutrophils Relative %: 47 %
Platelets: 250 10*3/uL (ref 150–400)
RBC: 3.05 MIL/uL — ABNORMAL LOW (ref 3.87–5.11)
RDW: 21.2 % — ABNORMAL HIGH (ref 11.5–15.5)
WBC: 7.9 10*3/uL (ref 4.0–10.5)
nRBC: 0 % (ref 0.0–0.2)

## 2021-01-19 LAB — PHOSPHORUS: Phosphorus: 2.3 mg/dL — ABNORMAL LOW (ref 2.5–4.6)

## 2021-01-19 LAB — MAGNESIUM: Magnesium: 1.7 mg/dL (ref 1.7–2.4)

## 2021-01-19 MED ORDER — POTASSIUM CHLORIDE CRYS ER 20 MEQ PO TBCR
40.0000 meq | EXTENDED_RELEASE_TABLET | Freq: Once | ORAL | Status: AC
  Administered 2021-01-19: 40 meq via ORAL
  Filled 2021-01-19: qty 2

## 2021-01-19 MED ORDER — SACCHAROMYCES BOULARDII 250 MG PO CAPS
250.0000 mg | ORAL_CAPSULE | Freq: Two times a day (BID) | ORAL | Status: DC
  Administered 2021-01-19 – 2021-02-10 (×45): 250 mg via ORAL
  Filled 2021-01-19 (×45): qty 1

## 2021-01-19 NOTE — Progress Notes (Signed)
It seems like the diarrhea might be getting a little bit better.  She is still on the oral vancomycin.  The abdominal wound is still leaking.  Surgery is helping to monitor this.  She still is utilizing pain medication on a regular basis.  Dilaudid seems to help her.  I was wondering if she might benefit from a Duragesic patch.  This might help control the pain a little bit better for the long-term.  Her potassium is 3.4.  I really do not think that she needs a cardiac monitor.  She is out of bed a little bit.  Her blood count shows a white count of 7.9.  Hemoglobin 9.5.  Platelet count 250,000.  Her BUN is 8 creatinine 0.5.  Her albumin is 2.0.  There may be some issues at home with her care.  Her husband works up in [place].  A 58 year old mother-in-law is 1 who apparently helps her out.  I am unsure if this is going to be feasible.  I will know she might need some home health care.  Her vital signs are temperature 98.4.  Pulse 70.  Blood pressure 177/91.  Her lungs are clear bilaterally.  Cardiac exam regular rate and rhythm with no murmurs, rubs or bruits.  Abdomen is soft.  Bowel sounds are present.  She has a abdominal wound dressing.  There is some tenderness about the lower abdomen to palpation.  Extremities shows no clubbing, cyanosis or edema.  She is still dealing with the C. difficile colitis.  She is on oral vancomycin.  Hopefully this is beginning to improve a little bit.  She has the abdominal wound.  Surgery is helping to manage this.  Thankfully, the does not seem to be an infection with this wound.  I still do not think she is ready to go home because of the diarrhea.  She had about 10 episodes of diarrhea yesterday.  I told her that with C. difficile, it can take 3 to 4 weeks before improvement really is noted.  I know that she is getting outstanding care from all the staff up on 6 E.  [name], MD  Proverbs 16:3

## 2021-01-19 NOTE — Progress Notes (Signed)
[facility] Progress Note     Subjective: CC-  On bedside commode. Continues to have intermittent crampy abdominal pain requiring PO and IV dilaudid. Some nausea, usually associated with pain. No emesis. Tolerating diet but not eating a lot due to lack of appetite. Diarrhea may have slowed down some but she continues to have several loose BMs daily. Stool is sometimes less watery. WBC 7.9, afebrile.  Objective: Vital signs in last 24 hours: Temp:  [98 F (36.7 C)-98.4 F (36.9 C)] 98.4 F (36.9 C) (03/29 0549) Pulse Rate:  [70-90] 70 (03/29 0549) Resp:  [16-18] 16 (03/29 0549) BP: (147-187)/(85-109) 177/91 (03/29 0549) SpO2:  [92 %-95 %] 95 % (03/29 0549) Last BM Date: 01/18/21  Intake/Output from previous day: 03/28 0701 - 03/29 0700 In: 480 [P.O.:480] Out: -  Intake/Output this shift: No intake/output data recorded.  PE: Gen:  Alert, NAD, pleasant Pulm:  rate and effort normal Abd: Soft, ND, minimal diffuse TTP, midline wound mostly healed with about 1cm opening at distal aspect with trace serous drainage and no cellulitis  Lab Results:  Recent Labs    01/18/21 0456 01/19/21 0523  WBC 8.1 7.9  HGB 9.5* 9.5*  HCT 29.5* 31.2*  PLT 247 250   BMET Recent Labs    01/18/21 0456 01/19/21 0523  NA 140 138  K 2.9* 3.4*  CL 108 108  CO2 25 24  GLUCOSE 110* 128*  BUN 8 8  CREATININE 0.51 0.50  CALCIUM 8.3* 8.1*   PT/INR No results for input(s): LABPROT, INR in the last 72 hours. CMP     Component Value Date/Time   NA 138 01/19/2021 0523   K 3.4 (L) 01/19/2021 0523   CL 108 01/19/2021 0523   CO2 24 01/19/2021 0523   GLUCOSE 128 (H) 01/19/2021 0523   BUN 8 01/19/2021 0523   CREATININE 0.50 01/19/2021 0523   CREATININE 0.72 10/28/2020 0956   CALCIUM 8.1 (L) 01/19/2021 0523   PROT 5.7 (L) 01/19/2021 0523   ALBUMIN 2.0 (L) 01/19/2021 0523   AST 24 01/19/2021 0523   AST 35 10/28/2020 0956   ALT 12 01/19/2021 0523   ALT 28 10/28/2020 0956    ALKPHOS 145 (H) 01/19/2021 0523   BILITOT 0.4 01/19/2021 0523   BILITOT 0.5 10/28/2020 0956   GFRNONAA >60 01/19/2021 0523   GFRNONAA >60 10/28/2020 0956   GFRAA >60 07/27/2020 0922   Lipase     Component Value Date/Time   LIPASE 34 01/16/2021 1043       Studies/Results: No results found.  Anti-infectives: Anti-infectives (From admission, onward)   Start     Dose/Rate Route Frequency Ordered Stop   01/17/21 0600  vancomycin (VANCOREADY) IVPB 1500 mg/300 mL  Status:  Discontinued        1,500 mg 150 mL/hr over 120 Minutes Intravenous Every 24 hours 01/16/21 1732 01/17/21 1216   01/16/21 2200  metroNIDAZOLE (FLAGYL) IVPB 500 mg  Status:  Discontinued        500 mg 100 mL/hr over 60 Minutes Intravenous Every 8 hours 01/16/21 1622 01/17/21 1216   01/16/21 2000  vancomycin (VANCOCIN) 50 mg/mL oral solution 125 mg        125 mg Oral 4 times daily 01/16/21 1805 01/26/21 2159   01/16/21 1800  ceFEPIme (MAXIPIME) 2 g in sodium chloride 0.9 % 100 mL IVPB  Status:  Discontinued        2 g 200 mL/hr over 30 Minutes Intravenous Every 8 hours 01/16/21 1728  01/17/21 1216   01/16/21 1545  vancomycin (VANCOCIN) 50 mg/mL oral solution 125 mg  Status:  Discontinued        125 mg Oral 4 times daily 01/16/21 1539 01/16/21 1622   01/16/21 1445  metroNIDAZOLE (FLAGYL) IVPB 500 mg        500 mg 100 mL/hr over 60 Minutes Intravenous  Once 01/16/21 1438 01/16/21 1622   01/16/21 1330  vancomycin (VANCOCIN) IVPB 1000 mg/200 mL premix        1,000 mg 200 mL/hr over 60 Minutes Intravenous  Once 01/16/21 1320 01/16/21 1538       Assessment/Plan Hx DVT/E s/p IVC filter HTN DM Anxiety Autoimmune hepatitis  Metastatic appendiceal cancer, pseudomyxoma peritonei and peritoneal carcinomatosis -S/P CRS/HIPECand splenectomy in 2005  -Recurrent cancer S/P HIPEC and debulking on 12/10/2020 in [place]; complicated by enterotomy requiring return to the OR on postop day 4 and then again for closure of  open midline wound -Postop abdominal wall fluid collection and deep pelvic collection on CT 01/16/21. No signs of infection on exam, drainage is serous and there is no cellulitis. Continue changing packing once daily and monitor -No new surgical recommendations   C diff colitis - on PO vancomycin  ID - PO vancomycin FEN - HH/CM diet VTE - lovenox Foley - none   LOS: 3 days    Wellington Hampshire, [facility] Surgery 01/19/2021, 11:41 AM Please see Amion for pager number during day hours 7:00am-4:30pm

## 2021-01-19 NOTE — Care Management Important Message (Signed)
Important Message  Patient Details IM Letter given to the Patient. Name: Wendy Barber MRN: [id number] Date of Birth: [birth year]   Medicare Important Message Given:  Yes     Kerin Salen 01/19/2021, 10:11 AM

## 2021-01-19 NOTE — Progress Notes (Signed)
PROGRESS NOTE    Wendy Barber  KVQ:[id number] DOB: 05-10-1961 DOA: 01/16/2021 PCP: Glendon Axe, MD   Chief Complaint  Patient presents with  . Diarrhea  . Wound Check    Brief Narrative:  This is Wendy Barber 60 year old female with Wendy Barber past medical history of metastatic appendiceal cancer, pseudomyxoma peritonei and peritoneal carcinomatosis s/p CRS/HIPEC and splenectomy in 2005 and recently CRS/HIPEC on 12/10/2020 for recurrent cancer, autoimmune hepatitis, DVT and PE with IVC filter currently on Lovenox, hypertension, diabetes who presented to the ED with multiple complaints including intractable diarrhea since her procedure, fatigue and wound drainage this Yaphet [place].m. Since her hospitalization in February she has had persistent diarrhea but tested negative for C. difficile and was started on Imodium and Lomotil with some improvement.  She was recently seen by her oncologist in [place], [place] on 3/16 and patient states that she started antibiotics at that time but is unsure what medication.  Says that her diarrhea has since worsened. Yesterday she started feeling very fatigued with decreased appetite and increasing pain and this morning she went to the bathroom and had an area of her incision open with Wendy Barber significant amount of purulent foul-smelling drainage.  She denies any fever, chills, chest pain.  Has chronic nausea which she takes Zofran multiple times daily for.  Of note, patient reports that she is up-to-date with her vaccines in regards to her splenectomy.  Regarding her recent surgery: She is s/p cytoreductive surgery/HIPEC 12/10/2020 at [facility] in [facility] with major reduction of small bowel, proximal jejunum, ascending colon, partial cystectomy.  Postoperative course was notable for anastomotic leak and she was taken back to the OR on POD #10 and closure of abdominal wound on POD #13.  She had Pura Picinich JP drain which was recently removed on 3/16.  She was admitted for C diff colitis and  concern for post op wound infection.    Assessment & Plan:   Principal Problem:   Surgical wound infection Active Problems:   Autoimmune hepatitis ([place])   DM2 (diabetes mellitus, type 2) ([place])   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)   DVT of deep femoral vein, left (HCC)   Pulmonary embolism, bilateral (HCC)   Presence of IVC filter   C. difficile enteritis  1. Abdominal post surgical wound infection Wendy Barber. Afebrile and hemodynamically stable on room air without leukocytosis   b. s/p cytoreductive surgery/HIPEC 12/10/2020 at [facility] in [place], [place] with major reduction of small bowel, proximal jejunum, ascending colon, partial cystectomy.  Postoperative course was notable for anastomotic leak and she was taken back to the OR on POD #10 and closure of abdominal wound on POD #13.  She had Markeeta Scalf JP drain which was recently removed on 3/16. c. CT reveals: Anterior abdominal wall ventral surgical wound collection three 3.2 x 1.8 x 13 cm concerning for infection as well as additional loculated fluid collection in the pelvis superior and anterior to the urinary bladder measuring 5.5 x 2.6 x 3.8 cm d. Patient is asplenic e. Appreciate surgery recommendations -> suspected superficial wound seroma in abdominal wall, recommended wound packing daily.  Suspects fluid collection in sterile fluid collection is sterile.  f. Aerobic cx pending 3/26 - few acinteobacter species, rare candida albicans, can discuss further with surgery g. Afebrile, normal WBC count - in setting of suspected post op fluid collections - will hold abx for now - especially in setting of c diff below h. Per surgery, may need IR evaluation to see  if any fluid collections can be aspirated - defer to surgery i. Continue pain management - prioritize PO meds j. IV fluids k. WOCN l. General surgery consulted  2. C. difficile enteritis Wendy Barber. Continued diarrhea (5 episodes recorded so far today) b. P.o.  vancomycin c. Hold home Imodium and Lomotil  3. Metastatic appendiceal cancer, pseudomyxoma peritonei and peritoneal carcinomatosis s/p CRS/HIPEC and splenectomy in 2005 and recently CRS/HIPEC on 12/10/2020 for recurrent cancer Wendy Barber. Will notify oncology   4. History of DVT and PE s/p IVC filter Wendy Barber. Continue home prophylactic Lovenox  5. Hypertension Wendy Barber. Amlodipine and propanolol   6. Essential Tremor 1. Propanolol   7. Anxiety Wendy Barber. Continue home BuSpar  7. Type 2 diabetes Wendy Barber. Glucose 122 and has not been taking her insulin at home b. Check HbA1c 5.8 *prediabets range c. Control with diet for now  8. Autoimmune hepatitis Wendy Barber. Holding home azathioprine  9. Hypokalemia Wendy Barber. Likely from GI losses b. Replace and follow  DVT prophylaxis: lovenox Code Status: full  Family Communication: daughter at bedside Disposition:   Status is: Inpatient  Remains inpatient appropriate because:Inpatient level of care appropriate due to severity of illness   Dispo: The patient is from: Home              Anticipated d/c is to: Home              Patient currently is not medically stable to d/c.   Difficult to place patient No       Consultants:   surgery  Procedures: none  Antimicrobials: Anti-infectives (From admission, onward)   Start     Dose/Rate Route Frequency Ordered Stop   01/17/21 0600  vancomycin (VANCOREADY) IVPB 1500 mg/300 mL  Status:  Discontinued        1,500 mg 150 mL/hr over 120 Minutes Intravenous Every 24 hours 01/16/21 1732 01/17/21 1216   01/16/21 2200  metroNIDAZOLE (FLAGYL) IVPB 500 mg  Status:  Discontinued        500 mg 100 mL/hr over 60 Minutes Intravenous Every 8 hours 01/16/21 1622 01/17/21 1216   01/16/21 2000  vancomycin (VANCOCIN) 50 mg/mL oral solution 125 mg        125 mg Oral 4 times daily 01/16/21 1805 01/26/21 2159   01/16/21 1800  ceFEPIme (MAXIPIME) 2 g in sodium chloride 0.9 % 100 mL IVPB  Status:  Discontinued        2 g 200 mL/hr over  30 Minutes Intravenous Every 8 hours 01/16/21 1728 01/17/21 1216   01/16/21 1545  vancomycin (VANCOCIN) 50 mg/mL oral solution 125 mg  Status:  Discontinued        125 mg Oral 4 times daily 01/16/21 1539 01/16/21 1622   01/16/21 1445  metroNIDAZOLE (FLAGYL) IVPB 500 mg        500 mg 100 mL/hr over 60 Minutes Intravenous  Once 01/16/21 1438 01/16/21 1622   01/16/21 1330  vancomycin (VANCOCIN) IVPB 1000 mg/200 mL premix        1,000 mg 200 mL/hr over 60 Minutes Intravenous  Once 01/16/21 1320 01/16/21 1538         Subjective: No new complaints  Objective: Vitals:   01/18/21 1623 01/18/21 2119 01/19/21 0549 01/19/21 1457  BP: (!) 173/91 (!) 147/85 (!) 177/91 (!) 150/85  Pulse: 90 79 70 78  Resp:  18 16 16   Temp:  98 F (36.7 C) 98.4 F (36.9 C) 98.6 F (37 C)  TempSrc:  Oral Oral Oral  SpO2:  92% 95% 98%  Weight:      Height:        Intake/Output Summary (Last 24 hours) at 01/19/2021 1853 Last data filed at 01/19/2021 0800 Gross per 24 hour  Intake 354 ml  Output --  Net 354 ml   Filed Weights   01/16/21 1700  Weight: 102.1 kg    Examination:  General: No acute distress. Cardiovascular: Heart sounds show Raquelle Pietro regular rate, and rhythm.  Lungs: Clear to auscultation bilaterally  Abdomen: Soft, nontender, nondistended - surgical scar, dehiscence with packing - serosanguinous drainage today Neurological: Alert and oriented 3. Moves all extremities 4. Cranial nerves II through XII grossly intact. Skin: Warm and dry. No rashes or lesions. Extremities: No clubbing or cyanosis. No edema.    Data Reviewed: I have personally reviewed following labs and imaging studies  CBC: Recent Labs  Lab 01/16/21 1420 01/17/21 0543 01/18/21 0456 01/19/21 0523  WBC 9.4 8.1 8.1 7.9  NEUTROABS 4.8  --  3.9 3.7  HGB 10.0* 9.6* 9.5* 9.5*  HCT 32.2* 30.5* 29.5* 31.2*  MCV 99.7 100.3* 98.7 102.3*  PLT 288 251 247 559    Basic Metabolic Panel: Recent Labs  Lab 01/16/21 1043  01/16/21 1420 01/17/21 0543 01/18/21 0456 01/19/21 0523  NA 140  --  138 140 138  K 2.9*  --  3.1* 2.9* 3.4*  CL 103  --  106 108 108  CO2 25  --  23 25 24   GLUCOSE 122*  --  116* 110* 128*  BUN 9  --  8 8 8   CREATININE 0.39*  --  0.53 0.51 0.50  CALCIUM 8.8*  --  8.2* 8.3* 8.1*  MG  --  1.9  --  1.7 1.7  PHOS  --   --   --  2.7 2.3*    GFR: Estimated Creatinine Clearance: 92.4 mL/min (by C-G formula based on SCr of 0.5 mg/dL).  Liver Function Tests: Recent Labs  Lab 01/16/21 1043 01/18/21 0456 01/19/21 0523  AST 28 22 24   ALT 14 11 12   ALKPHOS 185* 142* 145*  BILITOT 1.1 0.5 0.4  PROT 7.3 5.8* 5.7*  ALBUMIN 2.6* 2.0* 2.0*    CBG: No results for input(s): GLUCAP in the last 168 hours.   Recent Results (from the past 240 hour(s))  Blood culture (routine x 2)     Status: None (Preliminary result)   Collection Time: 01/16/21 11:14 AM   Specimen: BLOOD RIGHT HAND  Result Value Ref Range Status   Specimen Description   Final    BLOOD RIGHT HAND Performed at [facility], [place] [address] [place], [place] [address]    Special Requests   Final    BOTTLES DRAWN AEROBIC AND ANAEROBIC Blood Culture results may not be optimal due to an inadequate volume of blood received in culture bottles Performed at [facility] [address], [place], [place] [address]    Culture   Final    NO GROWTH 3 DAYS Performed at [facility], [place] [address] [place], [place] [address]    Report Status PENDING  Incomplete  Gastrointestinal Panel by PCR , Stool     Status: None   Collection Time: 01/16/21 11:28 AM   Specimen: Stool  Result Value Ref Range Status   Campylobacter species NOT DETECTED NOT DETECTED Final   Plesimonas shigelloides NOT DETECTED NOT DETECTED Final   Salmonella species NOT DETECTED NOT DETECTED Final   Yersinia enterocolitica NOT  DETECTED NOT DETECTED Final   Vibrio species NOT DETECTED NOT DETECTED Final   Vibrio cholerae NOT  DETECTED NOT DETECTED Final   Enteroaggregative E coli (EAEC) NOT DETECTED NOT DETECTED Final   Enteropathogenic E coli (EPEC) NOT DETECTED NOT DETECTED Final   Enterotoxigenic E coli (ETEC) NOT DETECTED NOT DETECTED Final   Shiga like toxin producing E coli (STEC) NOT DETECTED NOT DETECTED Final   Shigella/Enteroinvasive E coli (EIEC) NOT DETECTED NOT DETECTED Final   Cryptosporidium NOT DETECTED NOT DETECTED Final   Cyclospora cayetanensis NOT DETECTED NOT DETECTED Final   Entamoeba histolytica NOT DETECTED NOT DETECTED Final   Giardia lamblia NOT DETECTED NOT DETECTED Final   Adenovirus F40/41 NOT DETECTED NOT DETECTED Final   Astrovirus NOT DETECTED NOT DETECTED Final   Norovirus GI/GII NOT DETECTED NOT DETECTED Final   Rotavirus Wendy Barber NOT DETECTED NOT DETECTED Final   Sapovirus (I, II, IV, and V) NOT DETECTED NOT DETECTED Final    Comment: Performed at [facility], [place] [address] [place], [place] [address]  C Difficile Quick Screen w PCR reflex     Status: Abnormal   Collection Time: 01/16/21 11:28 AM   Specimen: Stool  Result Value Ref Range Status   C Diff antigen POSITIVE (Patirica Longshore) NEGATIVE Final   C Diff toxin NEGATIVE NEGATIVE Final   C Diff interpretation Results are indeterminate. See PCR results.  Final    Comment: Performed at [facility], [place] [address], [place], [place] [address]  C. Diff by PCR, Reflexed     Status: Abnormal   Collection Time: 01/16/21 11:28 AM  Result Value Ref Range Status   Toxigenic C. Difficile by PCR POSITIVE (Wendy Barber) NEGATIVE Final    Comment: Positive for toxigenic C. difficile with little to no toxin production. Only treat if clinical presentation suggests symptomatic illness. Performed at [facility], [place] [address] [place], [place] [address]   Blood culture (routine x 2)     Status: None (Preliminary result)   Collection Time: 01/16/21  2:19 PM   Specimen: BLOOD LEFT ARM  Result Value Ref Range Status   Specimen  Description   Final    BLOOD LEFT ARM Performed at [place] [address] [place], [place] [address]    Special Requests   Final    BOTTLES DRAWN AEROBIC AND ANAEROBIC Blood Culture adequate volume Performed at [facility] [address], [place], [place] [address]    Culture   Final    NO GROWTH 3 DAYS Performed at [facility], [place] [address], [place], [place] [address]    Report Status PENDING  Incomplete  Aerobic Culture w Gram Stain (superficial specimen)     Status: None (Preliminary result)   Collection Time: 01/16/21  2:20 PM   Specimen: Abdomen; Wound  Result Value Ref Range Status   Specimen Description   Final    ABDOMEN Performed at [facility] [address], [place], [place] [address]    Special Requests   Final    NONE Performed at [facility], [place] [address] [place], [place] [address]    Gram Stain   Final    FEW WBC PRESENT, PREDOMINANTLY PMN NO ORGANISMS SEEN Performed at [facility], [place] [address] [place], [place] [address]    Culture FEW ACINETOBACTER SPECIES RARE CANDIDA ALBICANS   Final   Report Status PENDING  Incomplete  Resp Panel by  RT-PCR (Flu Wendy Barber&B, Covid) Nasopharyngeal Swab     Status: None   Collection Time: 01/16/21  3:47 PM   Specimen: Nasopharyngeal Swab; Nasopharyngeal(NP) swabs in vial transport medium  Result Value Ref Range Status   SARS Coronavirus 2 by RT PCR NEGATIVE NEGATIVE Final    Comment: (NOTE) SARS-CoV-2 target nucleic acids are NOT DETECTED.  The SARS-CoV-2 RNA is generally detectable in upper respiratory specimens during the acute phase of infection. The lowest concentration of SARS-CoV-2 viral copies this assay can detect is 138 copies/mL. Wendy Barber negative result does not preclude SARS-Cov-2 infection and should not be used as the sole basis for treatment or other patient management decisions. Deem Marmol negative result may occur with   improper specimen collection/handling, submission of specimen other than nasopharyngeal swab, presence of viral mutation(s) within the areas targeted by this assay, and inadequate number of viral copies(<138 copies/mL). Wendy Barber negative result must be combined with clinical observations, patient history, and epidemiological information. The expected result is Negative.  Fact Sheet for Patients:  EntrepreneurPulse.com.au  Fact Sheet for Healthcare Providers:  IncredibleEmployment.be  This test is no t yet approved or cleared by the [place] FDA and  has been authorized for detection and/or diagnosis of SARS-CoV-2 by FDA under an Emergency Use Authorization (EUA). This EUA will remain  in effect (meaning this test can be used) for the duration of the COVID-19 declaration under Section 564(b)(1) of the Act, 21 U.S.C.section 360bbb-3(b)(1), unless the authorization is terminated  or revoked sooner.       Influenza Zalika Tieszen by PCR NEGATIVE NEGATIVE Final   Influenza B by PCR NEGATIVE NEGATIVE Final    Comment: (NOTE) The Xpert Xpress SARS-CoV-2/FLU/RSV plus assay is intended as an aid in the diagnosis of influenza from Nasopharyngeal swab specimens and should not be used as Wendy Azbill sole basis for treatment. Nasal washings and aspirates are unacceptable for Xpert Xpress SARS-CoV-2/FLU/RSV testing.  Fact Sheet for Patients: EntrepreneurPulse.com.au  Fact Sheet for Healthcare Providers: IncredibleEmployment.be  This test is not yet approved or cleared by the [place] FDA and has been authorized for detection and/or diagnosis of SARS-CoV-2 by FDA under an Emergency Use Authorization (EUA). This EUA will remain in effect (meaning this test can be used) for the duration of the COVID-19 declaration under Section 564(b)(1) of the Act, 21 U.S.C. section 360bbb-3(b)(1), unless the authorization is terminated  or revoked.  Performed at [facility], [place] [address], [place], [place] [address]          Radiology Studies: No results found.      Scheduled Meds: . acetaminophen  1,000 mg Oral Q8H  . amLODipine  5 mg Oral Daily  . busPIRone  5 mg Oral Q breakfast   And  . busPIRone  5 mg Oral Q1200   And  . busPIRone  10 mg Oral QHS  . enoxaparin  40 mg Subcutaneous Q24H  . multivitamin with minerals  1 tablet Oral Daily  . nutrition supplement (JUVEN)  1 packet Oral BID WC  . pantoprazole  40 mg Oral q morning  . propranolol  20 mg Oral BID  . Ensure Max Protein  11 oz Oral BID  . saccharomyces boulardii  250 mg Oral BID  . sodium chloride flush  3 mL Intravenous Q12H  . vancomycin  125 mg Oral QID   Continuous Infusions:    LOS: 3 days    Time spent: over 30 min    Fayrene Helper, MD [facility]  To contact the attending provider between 7A-7P or the covering provider during after hours 7P-7A, please log into the web site [email or web address] and access using universal [place] password for that web site. If you do not have the password, please call the hospital operator.  01/19/2021, 6:53 PM

## 2021-01-20 DIAGNOSIS — E876 Hypokalemia: Secondary | ICD-10-CM

## 2021-01-20 DIAGNOSIS — L02211 Cutaneous abscess of abdominal wall: Secondary | ICD-10-CM

## 2021-01-20 DIAGNOSIS — A0472 Enterocolitis due to Clostridium difficile, not specified as recurrent: Secondary | ICD-10-CM | POA: Diagnosis not present

## 2021-01-20 DIAGNOSIS — T8149XA Infection following a procedure, other surgical site, initial encounter: Secondary | ICD-10-CM | POA: Diagnosis not present

## 2021-01-20 DIAGNOSIS — K754 Autoimmune hepatitis: Secondary | ICD-10-CM | POA: Diagnosis not present

## 2021-01-20 LAB — COMPREHENSIVE METABOLIC PANEL
ALT: 12 U/L (ref 0–44)
AST: 26 U/L (ref 15–41)
Albumin: 2.2 g/dL — ABNORMAL LOW (ref 3.5–5.0)
Alkaline Phosphatase: 163 U/L — ABNORMAL HIGH (ref 38–126)
Anion gap: 7 (ref 5–15)
BUN: 10 mg/dL (ref 6–20)
CO2: 26 mmol/L (ref 22–32)
Calcium: 8.5 mg/dL — ABNORMAL LOW (ref 8.9–10.3)
Chloride: 108 mmol/L (ref 98–111)
Creatinine, Ser: 0.55 mg/dL (ref 0.44–1.00)
GFR, Estimated: >60 mL/min (ref >60)
Glucose, Bld: 128 mg/dL — ABNORMAL HIGH (ref 70–99)
Potassium: 3.9 mmol/L (ref 3.5–5.1)
Sodium: 141 mmol/L (ref 135–145)
Total Bilirubin: 0.5 mg/dL (ref 0.3–1.2)
Total Protein: 6 g/dL — ABNORMAL LOW (ref 6.5–8.1)

## 2021-01-20 LAB — CBC WITH DIFFERENTIAL/PLATELET
Abs Immature Granulocytes: 0.04 10*3/uL (ref 0.00–0.07)
Basophils Absolute: 0.1 10*3/uL (ref 0.0–0.1)
Basophils Relative: 1 %
Eosinophils Absolute: 0.4 10*3/uL (ref 0.0–0.5)
Eosinophils Relative: 4 %
HCT: 30.8 % — ABNORMAL LOW (ref 36.0–46.0)
Hemoglobin: 9.6 g/dL — ABNORMAL LOW (ref 12.0–15.0)
Immature Granulocytes: 0 %
Lymphocytes Relative: 36 %
Lymphs Abs: 3.2 10*3/uL (ref 0.7–4.0)
MCH: 31 pg (ref 26.0–34.0)
MCHC: 31.2 g/dL (ref 30.0–36.0)
MCV: 99.4 fL (ref 80.0–100.0)
Monocytes Absolute: 0.8 10*3/uL (ref 0.1–1.0)
Monocytes Relative: 9 %
Neutro Abs: 4.4 10*3/uL (ref 1.7–7.7)
Neutrophils Relative %: 50 %
Platelets: 204 10*3/uL (ref 150–400)
RBC: 3.1 MIL/uL — ABNORMAL LOW (ref 3.87–5.11)
RDW: 21.1 % — ABNORMAL HIGH (ref 11.5–15.5)
WBC: 8.9 10*3/uL (ref 4.0–10.5)
nRBC: 0 % (ref 0.0–0.2)

## 2021-01-20 LAB — AEROBIC CULTURE W GRAM STAIN (SUPERFICIAL SPECIMEN)

## 2021-01-20 LAB — PHOSPHORUS: Phosphorus: 2.9 mg/dL (ref 2.5–4.6)

## 2021-01-20 LAB — MAGNESIUM: Magnesium: 1.8 mg/dL (ref 1.7–2.4)

## 2021-01-20 MED ORDER — SIMETHICONE 80 MG PO CHEW
80.0000 mg | CHEWABLE_TABLET | Freq: Four times a day (QID) | ORAL | Status: DC | PRN
  Administered 2021-01-20 – 2021-02-10 (×11): 80 mg via ORAL
  Filled 2021-01-20 (×12): qty 1

## 2021-01-20 MED ORDER — MAGNESIUM SULFATE 2 GM/50ML IV SOLN
2.0000 g | Freq: Once | INTRAVENOUS | Status: AC
  Administered 2021-01-20: 2 g via INTRAVENOUS
  Filled 2021-01-20: qty 50

## 2021-01-20 NOTE — Progress Notes (Signed)
Subjective: Site changed early this a.m.  Openings about 5 or 6 mm in diameter.  They will need to pack this with quarter inch packing or iodoform.  No drainage at the time I saw her.  Objective: Vital signs in last 24 hours: Temp:  [98.6 F (37 C)-99 F (37.2 C)] 98.9 F (37.2 C) (03/30 0542) Pulse Rate:  [75-80] 75 (03/30 0542) Resp:  [16] 16 (03/30 0542) BP: (150-157)/(85-100) 157/100 (03/30 0542) SpO2:  [96 %-98 %] 96 % (03/30 0542) Last BM Date: 01/19/21 354 p.o. recorded No other intake recorded. Voided x4 BM x5 Afebrile, vital signs are stable blood pressure still high 157/100 and 0300 hrs. CMP is stable WBC 8.9    Intake/Output from previous day: 03/29 0701 - 03/30 0700 In: 354 [P.O.:354] Out: -  Intake/Output this shift: No intake/output data recorded.  General appearance: alert, cooperative and no distress Resp: clear to auscultation bilaterally GI: Midline incision has about a 5 or 6 mm opening.  No packing currently.  No erythema or drainage currently.  Tolerating a soft diet.  The number of BMs is decreasing.  Lab Results:  Recent Labs    01/19/21 0523 01/20/21 0610  WBC 7.9 8.9  HGB 9.5* 9.6*  HCT 31.2* 30.8*  PLT 250 204    BMET Recent Labs    01/19/21 0523 01/20/21 0610  NA 138 141  K 3.4* 3.9  CL 108 108  CO2 24 26  GLUCOSE 128* 128*  BUN 8 10  CREATININE 0.50 0.55  CALCIUM 8.1* 8.5*   PT/INR No results for input(s): LABPROT, INR in the last 72 hours.  Recent Labs  Lab 01/16/21 1043 01/18/21 0456 01/19/21 0523 01/20/21 0610  AST 28 22 24 26   ALT 14 11 12 12   ALKPHOS 185* 142* 145* 163*  BILITOT 1.1 0.5 0.4 0.5  PROT 7.3 5.8* 5.7* 6.0*  ALBUMIN 2.6* 2.0* 2.0* 2.2*     Lipase     Component Value Date/Time   LIPASE 34 01/16/2021 1043     Medications: . acetaminophen  1,000 mg Oral Q8H  . amLODipine  5 mg Oral Daily  . busPIRone  5 mg Oral Q breakfast   And  . busPIRone  5 mg Oral Q1200   And  .  busPIRone  10 mg Oral QHS  . enoxaparin  40 mg Subcutaneous Q24H  . multivitamin with minerals  1 tablet Oral Daily  . nutrition supplement (JUVEN)  1 packet Oral BID WC  . pantoprazole  40 mg Oral q morning  . propranolol  20 mg Oral BID  . Ensure Max Protein  11 oz Oral BID  . saccharomyces boulardii  250 mg Oral BID  . sodium chloride flush  3 mL Intravenous Q12H  . vancomycin  125 mg Oral QID    Assessment/Plan Hx DVT/E s/p IVC filter HTN DM Anxiety Autoimmune hepatitis  Metastatic appendiceal cancer, pseudomyxoma peritonei and peritoneal carcinomatosis -S/P CRS/HIPECand splenectomy in 2005  -Recurrent cancer S/P HIPEC and debulking on 12/10/2020 in [place]; complicated by enterotomy requiring return to the OR on postop day 4 and then again for closure of open midline wound -Postop abdominal wall fluid collection and deep pelvic collection on CT, 01/16/21. No signs of infection on exam, drainage is serous and there is no cellulitis. Continue changing packing once daily and monitor -No new surgical recommendations   C diff colitis - on PO vancomycin  ID - PO vancomycin FEN - HH/CM diet  VTE - lovenox Foley - none        LOS: 4 days    Wendy Barber 01/20/2021 Please see Amion

## 2021-01-20 NOTE — Progress Notes (Signed)
PROGRESS NOTE    Wendy Barber  VQM:[id number] DOB: May 10, 1961 DOA: 01/16/2021 PCP: Glendon Axe, MD    Chief Complaint  Patient presents with  . Diarrhea  . Wound Check    Brief Narrative:  This is a 60 year old female with a past medical history of metastatic appendiceal cancer, pseudomyxoma peritonei and peritoneal carcinomatosiss/p CRS/HIPECand splenectomy in 2005 and recently CRS/HIPEC on 12/10/2020 for recurrent cancer,autoimmune hepatitis,DVT and PE with IVC filtercurrently on Lovenox, hypertension, diabetes who presented to the ED with multiple complaints including intractable diarrhea since her procedure, fatigue and wound drainage this a.m.Since her hospitalization in February she has had persistent diarrhea but tested negative for C. difficile and was started on Imodium and Lomotil with some improvement. She was recently seen by her oncologist in [place] on 3/16 and patient states that she started antibiotics at that time but is unsure what medication. Says that her diarrhea has since worsened. Yesterday she started feeling very fatigued with decreased appetite and increasing pain and this morning she went to the bathroom and had an area of her incision openwith a significant amount of purulent foul-smelling drainage.She denies any fever, chills, chest pain. Has chronic nausea which she takes Zofran multiple times daily for. Of note, patient reports that she is up-to-date with her vaccines in regards to her splenectomy.  Regarding her recent surgery: She iss/pcytoreductive surgery/HIPEC 12/10/2020 at [facility] in [facility] with major reduction of small bowel, proximal jejunum, ascending colon, partial cystectomy. Postoperative course was notable for anastomotic leak and she was taken back to the OR on POD #10 and closure of abdominal wound on POD #13. She had a JP drain which was recently removed on 3/16.  She was admitted for C diff colitis  and concern for post op wound infection.     Assessment & Plan:   Principal Problem:   Surgical wound infection Active Problems:   Autoimmune hepatitis ([place])   DM2 (diabetes mellitus, type 2) ([place])   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)   DVT of deep femoral vein, left (HCC)   Pulmonary embolism, bilateral (HCC)   Presence of IVC filter   C. difficile enteritis  1 abdominal postsurgical wound infection -Patient presented with concerns for abdominal postsurgical wound infection. -Patient afebrile on room air, no leukocytosis. -Status post cytoreductive surgery/HI PEC 12/10/2020 at [facility] in [place], [place] with major reduction of small bowel, proximal jejunum, ascending colon, partial cystectomy. -Postop course for anastomotic leak and taken back to the OR on POD #10 with closure of abdominal wound on POD #13. -Status post JP drain recently removed 01/06/2021. -CT abdomen and pelvis done concerning for anterior abdominal wall ventral surgical wound collection three, 3.2 x 1.8 x 13 cm concerning for infection as well as additional loculated fluid collection in the pelvis superior and anterior to the urinary bladder measuring 5.5 x 2.6 x 3.8 cm. -Asplenic. -General surgery following and suspected superficial wound seroma in abdominal wall, recommend wound packing daily, suspect fluid collection in sterile fluid collection is sterile. -Wound cultures with few actinobacter species, rare Candida albicans. -We will need to discuss with general surgery on wound culture results and as to whether patient may need additional fluconazole. -Continue current pain management. -IV fluids, supportive care. -Per general surgery.  2.  C. difficile enteritis -Slowly improving clinically. -Afebrile. -Normal white count. -Patient with ongoing loose stools but consistency improvement. -Continue oral vancomycin.  3.  Metastatic appendiceal cancer, pseudomyxoma  peritonei and peritoneal carcinomatosis status  post CRS/HI PEC and splenectomy in 2005 and recently CRS/HI PEC on 12/10/2020 for recurrent cancer -Per oncology.  4.  History of DVT and PE status post IVC filter -Continue home dose prophylactic Lovenox.  5.  Hypertension -Increase Norvasc to 10 mg daily.  Continue propranolol.  6.  Essential tremor Propranolol.  7.  Anxiety -BuSpar.  8.  Type 2 diabetes -Hemoglobin A1c 5.8 (3/26) -Diet controlled.  9.  Hypokalemia -Secondary to GI losses. -Replete.  10.  Autoimmune hepatitis -Continue to hold azathioprine and could likely resume after antibiotic course has been completed.   DVT prophylaxis: Lovenox Code Status: Full Family Communication: Updated patient.  No family at bedside. Disposition:   Status is: Inpatient    Dispo: The patient is from: Home              Anticipated d/c is to: Likely home              Patient currently being treated for C. difficile colitis, concern for abdominal wound infections, not stable for discharge.   Difficult to place patient no       Consultants:   Oncology: Dr. [name]  General surgery: Dr. Ninfa Linden 01/16/2021  Procedures:   CT abdomen and pelvis 01/16/2021  Antimicrobials:   IV cefepime 01/16/2021>>> 01/17/2021  IV Flagyl 01/16/2021>>>> 01/17/2021  IV vancomycin 01/16/2021>>>> 01/17/2021  Oral vancomycin 01/16/2021>>>>> 01/26/2021   Subjective: Laying in bed on the telephone.  States still with loose stools however consistency is improving.  Still with some abdominal pain.  No chest pain or shortness of breath.  Tolerating current diet.  Objective: Vitals:   01/19/21 0549 01/19/21 1457 01/19/21 2007 01/20/21 0542  BP: (!) 177/91 (!) 150/85 (!) 156/93 (!) 157/100  Pulse: 70 78 80 75  Resp: 16 16 16 16   Temp: 98.4 F (36.9 C) 98.6 F (37 C) 99 F (37.2 C) 98.9 F (37.2 C)  TempSrc: Oral Oral Oral Oral  SpO2: 95% 98% 97% 96%  Weight:      Height:       No intake  or output data in the 24 hours ending 01/20/21 1332 Filed Weights   01/16/21 1700  Weight: 102.1 kg    Examination:  General exam: Appears calm and comfortable  Respiratory system: Clear to auscultation. Respiratory effort normal. Cardiovascular system: S1 & S2 heard, RRR. No JVD, murmurs, rubs, gallops or clicks. No pedal edema. Gastrointestinal system: Abdomen is nondistended, soft and some tenderness to palpation lower abdominal region.  Positive bowel sounds.  No rebound.  No guarding.   Central nervous system: Alert and oriented. No focal neurological deficits. Extremities: Symmetric 5 x 5 power. Skin: No rashes, lesions or ulcers Psychiatry: Judgement and insight appear normal. Mood & affect appropriate.     Data Reviewed: I have personally reviewed following labs and imaging studies  CBC: Recent Labs  Lab 01/16/21 1420 01/17/21 0543 01/18/21 0456 01/19/21 0523 01/20/21 0610  WBC 9.4 8.1 8.1 7.9 8.9  NEUTROABS 4.8  --  3.9 3.7 4.4  HGB 10.0* 9.6* 9.5* 9.5* 9.6*  HCT 32.2* 30.5* 29.5* 31.2* 30.8*  MCV 99.7 100.3* 98.7 102.3* 99.4  PLT 288 251 247 250 570    Basic Metabolic Panel: Recent Labs  Lab 01/16/21 1043 01/16/21 1420 01/17/21 0543 01/18/21 0456 01/19/21 0523 01/20/21 0610  NA 140  --  138 140 138 141  K 2.9*  --  3.1* 2.9* 3.4* 3.9  CL 103  --  106 108 108 108  CO2  25  --  23 25 24 26   GLUCOSE 122*  --  116* 110* 128* 128*  BUN 9  --  8 8 8 10   CREATININE 0.39*  --  0.53 0.51 0.50 0.55  CALCIUM 8.8*  --  8.2* 8.3* 8.1* 8.5*  MG  --  1.9  --  1.7 1.7 1.8  PHOS  --   --   --  2.7 2.3* 2.9    GFR: Estimated Creatinine Clearance: 92.4 mL/min (by C-G formula based on SCr of 0.55 mg/dL).  Liver Function Tests: Recent Labs  Lab 01/16/21 1043 01/18/21 0456 01/19/21 0523 01/20/21 0610  AST 28 22 24 26   ALT 14 11 12 12   ALKPHOS 185* 142* 145* 163*  BILITOT 1.1 0.5 0.4 0.5  PROT 7.3 5.8* 5.7* 6.0*  ALBUMIN 2.6* 2.0* 2.0* 2.2*    CBG: No  results for input(s): GLUCAP in the last 168 hours.   Recent Results (from the past 240 hour(s))  Blood culture (routine x 2)     Status: None (Preliminary result)   Collection Time: 01/16/21 11:14 AM   Specimen: BLOOD RIGHT HAND  Result Value Ref Range Status   Specimen Description   Final    BLOOD RIGHT HAND Performed at [facility], [place] [address] [place], [place] [address]    Special Requests   Final    BOTTLES DRAWN AEROBIC AND ANAEROBIC Blood Culture results may not be optimal due to an inadequate volume of blood received in culture bottles Performed at [facility] [address], [place], [place] [address]    Culture   Final    NO GROWTH 4 DAYS Performed at [facility], [place] [address] [place], [place] [address]    Report Status PENDING  Incomplete  Gastrointestinal Panel by PCR , Stool     Status: None   Collection Time: 01/16/21 11:28 AM   Specimen: Stool  Result Value Ref Range Status   Campylobacter species NOT DETECTED NOT DETECTED Final   Plesimonas shigelloides NOT DETECTED NOT DETECTED Final   Salmonella species NOT DETECTED NOT DETECTED Final   Yersinia enterocolitica NOT DETECTED NOT DETECTED Final   Vibrio species NOT DETECTED NOT DETECTED Final   Vibrio cholerae NOT DETECTED NOT DETECTED Final   Enteroaggregative E coli (EAEC) NOT DETECTED NOT DETECTED Final   Enteropathogenic E coli (EPEC) NOT DETECTED NOT DETECTED Final   Enterotoxigenic E coli (ETEC) NOT DETECTED NOT DETECTED Final   Shiga like toxin producing E coli (STEC) NOT DETECTED NOT DETECTED Final   Shigella/Enteroinvasive E coli (EIEC) NOT DETECTED NOT DETECTED Final   Cryptosporidium NOT DETECTED NOT DETECTED Final   Cyclospora cayetanensis NOT DETECTED NOT DETECTED Final   Entamoeba histolytica NOT DETECTED NOT DETECTED Final   Giardia lamblia NOT DETECTED NOT DETECTED Final   Adenovirus F40/41 NOT DETECTED NOT DETECTED Final   Astrovirus NOT DETECTED NOT  DETECTED Final   Norovirus GI/GII NOT DETECTED NOT DETECTED Final   Rotavirus A NOT DETECTED NOT DETECTED Final   Sapovirus (I, II, IV, and V) NOT DETECTED NOT DETECTED Final    Comment: Performed at [facility], [place] [place], [place] [address]  C Difficile Quick Screen w PCR reflex     Status: Abnormal   Collection Time: 01/16/21 11:28 AM   Specimen: Stool  Result Value Ref Range Status   C Diff antigen POSITIVE (A) NEGATIVE Final   C Diff toxin NEGATIVE NEGATIVE Final  C Diff interpretation Results are indeterminate. See PCR results.  Final    Comment: Performed at [facility], [place] [address] [place], [place] [address]  C. Diff by PCR, Reflexed     Status: Abnormal   Collection Time: 01/16/21 11:28 AM  Result Value Ref Range Status   Toxigenic C. Difficile by PCR POSITIVE (A) NEGATIVE Final    Comment: Positive for toxigenic C. difficile with little to no toxin production. Only treat if clinical presentation suggests symptomatic illness. Performed at [facility], [place] [address], [place], [place] [address]   Blood culture (routine x 2)     Status: None (Preliminary result)   Collection Time: 01/16/21  2:19 PM   Specimen: BLOOD LEFT ARM  Result Value Ref Range Status   Specimen Description   Final    BLOOD LEFT ARM Performed at [facility] [address], [place], [place] [address]    Special Requests   Final    BOTTLES DRAWN AEROBIC AND ANAEROBIC Blood Culture adequate volume Performed at [place] [address] [place], [place] [address]    Culture   Final    NO GROWTH 4 DAYS Performed at [facility], [place] [address], [place], [place] [address]    Report Status PENDING  Incomplete  Aerobic Culture w Gram Stain (superficial specimen)     Status: None   Collection Time: 01/16/21  2:20 PM   Specimen: Abdomen; Wound  Result Value Ref Range Status   Specimen Description   Final     ABDOMEN Performed at [facility] [address], [place], [place] [address]    Special Requests   Final    NONE Performed at [facility], [place] [address], [place], [place] [address]    Gram Stain   Final    FEW WBC PRESENT, PREDOMINANTLY PMN NO ORGANISMS SEEN Performed at [facility], [place] [address], [place], [place] [address]    Culture FEW ACINETOBACTER SPECIES RARE CANDIDA ALBICANS   Final   Report Status 01/20/2021 FINAL  Final   Organism ID, Bacteria ACINETOBACTER SPECIES  Final      Susceptibility   Acinetobacter species - MIC*    CEFTAZIDIME 4 SENSITIVE Sensitive     CIPROFLOXACIN <=0.25 SENSITIVE Sensitive     GENTAMICIN <=1 SENSITIVE Sensitive     IMIPENEM <=0.25 SENSITIVE Sensitive     PIP/TAZO >=128 RESISTANT Resistant     TRIMETH/SULFA <=20 SENSITIVE Sensitive     AMPICILLIN/SULBACTAM <=2 SENSITIVE Sensitive     * FEW ACINETOBACTER SPECIES  Resp Panel by RT-PCR (Flu A&B, Covid) Nasopharyngeal Swab     Status: None   Collection Time: 01/16/21  3:47 PM   Specimen: Nasopharyngeal Swab; Nasopharyngeal(NP) swabs in vial transport medium  Result Value Ref Range Status   SARS Coronavirus 2 by RT PCR NEGATIVE NEGATIVE Final    Comment: (NOTE) SARS-CoV-2 target nucleic acids are NOT DETECTED.  The SARS-CoV-2 RNA is generally detectable in upper respiratory specimens during the acute phase of infection. The lowest concentration of SARS-CoV-2 viral copies this assay can detect is 138 copies/mL. A negative result does not preclude SARS-Cov-2 infection and should not be used as the sole basis for treatment or other patient management decisions. A negative result may occur with  improper specimen collection/handling, submission of specimen other than nasopharyngeal swab, presence of viral mutation(s) within the areas targeted by this assay, and inadequate number of  viral copies(<138 copies/mL). A negative result must be  combined with clinical observations, patient history, and epidemiological information. The expected result is Negative.  Fact Sheet for Patients:  EntrepreneurPulse.com.au  Fact Sheet for Healthcare Providers:  IncredibleEmployment.be  This test is no t yet approved or cleared by the [place] FDA and  has been authorized for detection and/or diagnosis of SARS-CoV-2 by FDA under an Emergency Use Authorization (EUA). This EUA will remain  in effect (meaning this test can be used) for the duration of the COVID-19 declaration under Section 564(b)(1) of the Act, 21 U.S.C.section 360bbb-3(b)(1), unless the authorization is terminated  or revoked sooner.       Influenza A by PCR NEGATIVE NEGATIVE Final   Influenza B by PCR NEGATIVE NEGATIVE Final    Comment: (NOTE) The Xpert Xpress SARS-CoV-2/FLU/RSV plus assay is intended as an aid in the diagnosis of influenza from Nasopharyngeal swab specimens and should not be used as a sole basis for treatment. Nasal washings and aspirates are unacceptable for Xpert Xpress SARS-CoV-2/FLU/RSV testing.  Fact Sheet for Patients: EntrepreneurPulse.com.au  Fact Sheet for Healthcare Providers: IncredibleEmployment.be  This test is not yet approved or cleared by the [place] FDA and has been authorized for detection and/or diagnosis of SARS-CoV-2 by FDA under an Emergency Use Authorization (EUA). This EUA will remain in effect (meaning this test can be used) for the duration of the COVID-19 declaration under Section 564(b)(1) of the Act, 21 U.S.C. section 360bbb-3(b)(1), unless the authorization is terminated or revoked.  Performed at [facility], [place] [address], [place], [place] [address]          Radiology Studies: No results found.      Scheduled Meds: . acetaminophen  1,000 mg Oral Q8H  . amLODipine  5 mg Oral Daily  . busPIRone   5 mg Oral Q breakfast   And  . busPIRone  5 mg Oral Q1200   And  . busPIRone  10 mg Oral QHS  . enoxaparin  40 mg Subcutaneous Q24H  . multivitamin with minerals  1 tablet Oral Daily  . nutrition supplement (JUVEN)  1 packet Oral BID WC  . pantoprazole  40 mg Oral q morning  . propranolol  20 mg Oral BID  . Ensure Max Protein  11 oz Oral BID  . saccharomyces boulardii  250 mg Oral BID  . sodium chloride flush  3 mL Intravenous Q12H  . vancomycin  125 mg Oral QID   Continuous Infusions:   LOS: 4 days    Time spent: 35 minutes    Irine Seal, MD [facility]   To contact the attending provider between 7A-7P or the covering provider during after hours 7P-7A, please log into the web site [email or web address] and access using universal [place] password for that web site. If you do not have the password, please call the hospital operator.  01/20/2021, 1:32 PM

## 2021-01-20 NOTE — Progress Notes (Signed)
It looks like things might be improving slowly but surely.  The diarrhea might be slowing down.  She feels that the diarrhea might be a little bit more substantial.  Her electrolytes not back yet.  Her CBC does not look all that bad.  She is eating.  She is out of bed a little bit.  The pain seems to be fairly well controlled.  She still has some pressure down in the lower abdomen where her abdominal wound is.  She has had no cough or shortness of breath.  She has had no bleeding.  She has had no rashes.  Her blood pressure has been on the higher side.  Her temperature is good at 98.9.  Pulse is 75.  On exam, her lungs sound clear bilaterally.  Cardiac exam regular rate and rhythm.  Abdomen is soft.  Bowel sounds are present.  She has a abdominal dressing over the wound.  There is no guarding or rebound tenderness.  Wendy Barber has C. difficile colitis.  She is on oral vancomycin.  She is improving slowly.  We will see what her electrolytes look like.  She still does not feel that she is ready to go home because of the diarrhea.  I appreciate the great care that she is getting from the staff on 6 E.  [name], MD  Psalms 119:36

## 2021-01-21 DIAGNOSIS — K754 Autoimmune hepatitis: Secondary | ICD-10-CM | POA: Diagnosis not present

## 2021-01-21 DIAGNOSIS — A0472 Enterocolitis due to Clostridium difficile, not specified as recurrent: Secondary | ICD-10-CM | POA: Diagnosis not present

## 2021-01-21 DIAGNOSIS — L02211 Cutaneous abscess of abdominal wall: Secondary | ICD-10-CM | POA: Diagnosis not present

## 2021-01-21 DIAGNOSIS — T8149XA Infection following a procedure, other surgical site, initial encounter: Secondary | ICD-10-CM | POA: Diagnosis not present

## 2021-01-21 LAB — BASIC METABOLIC PANEL
Anion gap: 9 (ref 5–15)
BUN: 9 mg/dL (ref 6–20)
CO2: 25 mmol/L (ref 22–32)
Calcium: 8.1 mg/dL — ABNORMAL LOW (ref 8.9–10.3)
Chloride: 104 mmol/L (ref 98–111)
Creatinine, Ser: 0.4 mg/dL — ABNORMAL LOW (ref 0.44–1.00)
GFR, Estimated: >60 mL/min (ref >60)
Glucose, Bld: 105 mg/dL — ABNORMAL HIGH (ref 70–99)
Potassium: 3.7 mmol/L (ref 3.5–5.1)
Sodium: 138 mmol/L (ref 135–145)

## 2021-01-21 LAB — CULTURE, BLOOD (ROUTINE X 2)
Culture: NO GROWTH
Culture: NO GROWTH
Special Requests: ADEQUATE

## 2021-01-21 LAB — CBC WITH DIFFERENTIAL/PLATELET
Abs Immature Granulocytes: 0.03 10*3/uL (ref 0.00–0.07)
Basophils Absolute: 0.1 10*3/uL (ref 0.0–0.1)
Basophils Relative: 1 %
Eosinophils Absolute: 0.3 10*3/uL (ref 0.0–0.5)
Eosinophils Relative: 3 %
HCT: 31.3 % — ABNORMAL LOW (ref 36.0–46.0)
Hemoglobin: 9.6 g/dL — ABNORMAL LOW (ref 12.0–15.0)
Immature Granulocytes: 0 %
Lymphocytes Relative: 41 %
Lymphs Abs: 3.8 10*3/uL (ref 0.7–4.0)
MCH: 30.6 pg (ref 26.0–34.0)
MCHC: 30.7 g/dL (ref 30.0–36.0)
MCV: 99.7 fL (ref 80.0–100.0)
Monocytes Absolute: 0.9 10*3/uL (ref 0.1–1.0)
Monocytes Relative: 9 %
Neutro Abs: 4.3 10*3/uL (ref 1.7–7.7)
Neutrophils Relative %: 46 %
Platelets: 261 10*3/uL (ref 150–400)
RBC: 3.14 MIL/uL — ABNORMAL LOW (ref 3.87–5.11)
RDW: 20.8 % — ABNORMAL HIGH (ref 11.5–15.5)
WBC: 9.3 10*3/uL (ref 4.0–10.5)
nRBC: 0 % (ref 0.0–0.2)

## 2021-01-21 LAB — MAGNESIUM: Magnesium: 1.8 mg/dL (ref 1.7–2.4)

## 2021-01-21 MED ORDER — MAGNESIUM SULFATE 4 GM/100ML IV SOLN
4.0000 g | Freq: Once | INTRAVENOUS | Status: AC
  Administered 2021-01-21: 4 g via INTRAVENOUS
  Filled 2021-01-21: qty 100

## 2021-01-21 MED ORDER — POTASSIUM CHLORIDE CRYS ER 20 MEQ PO TBCR
40.0000 meq | EXTENDED_RELEASE_TABLET | Freq: Once | ORAL | Status: AC
  Administered 2021-01-21: 40 meq via ORAL
  Filled 2021-01-21: qty 2

## 2021-01-21 MED ORDER — AMLODIPINE BESYLATE 10 MG PO TABS
10.0000 mg | ORAL_TABLET | Freq: Every day | ORAL | Status: DC
  Administered 2021-01-21 – 2021-02-04 (×15): 10 mg via ORAL
  Filled 2021-01-21 (×15): qty 1

## 2021-01-21 NOTE — Progress Notes (Signed)
PROGRESS NOTE    Wendy Barber  LNL:[id number] DOB: [birth year] DOA: 01/16/2021 PCP: Glendon Axe, MD    Chief Complaint  Patient presents with  . Diarrhea  . Wound Check    Brief Narrative:  This is a 60 year old female with a past medical history of metastatic appendiceal cancer, pseudomyxoma peritonei and peritoneal carcinomatosiss/p CRS/HIPECand splenectomy in 2005 and recently CRS/HIPEC on 12/10/2020 for recurrent cancer,autoimmune hepatitis,DVT and PE with IVC filtercurrently on Lovenox, hypertension, diabetes who presented to the ED with multiple complaints including intractable diarrhea since her procedure, fatigue and wound drainage this a.m.Since her hospitalization in February she has had persistent diarrhea but tested negative for C. difficile and was started on Imodium and Lomotil with some improvement. She was recently seen by her oncologist in [place] on 3/16 and patient states that she started antibiotics at that time but is unsure what medication. Says that her diarrhea has since worsened. Yesterday she started feeling very fatigued with decreased appetite and increasing pain and this morning she went to the bathroom and had an area of her incision openwith a significant amount of purulent foul-smelling drainage.She denies any fever, chills, chest pain. Has chronic nausea which she takes Zofran multiple times daily for. Of note, patient reports that she is up-to-date with her vaccines in regards to her splenectomy.  Regarding her recent surgery: She iss/pcytoreductive surgery/HIPEC 12/10/2020 at [facility] with major reduction of small bowel, proximal jejunum, ascending colon, partial cystectomy. Postoperative course was notable for anastomotic leak and she was taken back to the OR on POD #10 and closure of abdominal wound on POD #13. She had a JP drain which was recently removed on 3/16.  She was admitted for C diff colitis  and concern for post op wound infection.     Assessment & Plan:   Principal Problem:   Surgical wound infection Active Problems:   Autoimmune hepatitis ([place])   DM2 (diabetes mellitus, type 2) ([place])   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)   DVT of deep femoral vein, left (HCC)   Pulmonary embolism, bilateral (HCC)   Presence of IVC filter   C. difficile enteritis   Abscess of abdominal wall   Hypokalemia  1 abdominal postsurgical wound infection -Patient presented with concerns for abdominal postsurgical wound infection. -Patient afebrile on room air, no leukocytosis. -Status post cytoreductive surgery/HI PEC 12/10/2020 at [facility] in [place], [place] with major reduction of small bowel, proximal jejunum, ascending colon, partial cystectomy. -Postop course for anastomotic leak and taken back to the OR on POD #10 with closure of abdominal wound on POD #13. -Status post JP drain recently removed 01/06/2021. -CT abdomen and pelvis done concerning for anterior abdominal wall ventral surgical wound collection three, 3.2 x 1.8 x 13 cm concerning for infection as well as additional loculated fluid collection in the pelvis superior and anterior to the urinary bladder measuring 5.5 x 2.6 x 3.8 cm. -Asplenic. -General surgery following and suspected superficial wound seroma in abdominal wall, recommend wound packing daily, suspect fluid collection in sterile fluid collection is sterile. -Wound cultures with few actinobacter species, rare Candida albicans. -Discussed with general surgery about wound culture results and I do not feel an active infection at this time and they do not feel anything further to do in addition to current treatment for C. difficile colitis.   -Monitor leukocytosis and if worsening may need to add fluconazole -Continue current pain management. -Saline lock IV fluids.   -General  surgery following peripherally.  2.  C. difficile  enteritis -Improving clinically however still with frequent stools.   -Consistency of stools improving.   -Afebrile.   -Normal white count.   -Continue oral vancomycin.  3.  Metastatic appendiceal cancer, pseudomyxoma peritonei and peritoneal carcinomatosis status post CRS/HI PEC and splenectomy in 2005 and recently CRS/HI PEC on 12/10/2020 for recurrent cancer -Per oncology.  4.  History of DVT and PE status post IVC filter -Continue home dose prophylactic Lovenox.  5.  Hypertension -Continue Norvasc 10 mg daily, propranolol.   6.  Essential tremor Propranolol.  7.  Anxiety -Continue home regimen BuSpar.  8.  Type 2 diabetes -Hemoglobin A1c 5.8 (3/26) -Diet controlled.  9.  Hypokalemia -Secondary to GI losses.   -Potassium at 3.7.   -K. Dur 40 mEq p.o. x1.   10.  Autoimmune hepatitis -Continue to hold azathioprine and could likely resume after antibiotic course has been completed.   DVT prophylaxis: Lovenox Code Status: Full Family Communication: Updated patient.  No family at bedside. Disposition:   Status is: Inpatient    Dispo: The patient is from: Home              Anticipated d/c is to: Likely home              Patient currently being treated for C. difficile colitis, concern for abdominal wound infections, not stable for discharge.   Difficult to place patient no       Consultants:   Oncology: Dr. [name]  General surgery: Dr. Ninfa Linden 01/16/2021  Procedures:   CT abdomen and pelvis 01/16/2021  Antimicrobials:   IV cefepime 01/16/2021>>> 01/17/2021  IV Flagyl 01/16/2021>>>> 01/17/2021  IV vancomycin 01/16/2021>>>> 01/17/2021  Oral vancomycin 01/16/2021>>>>> 01/26/2021   Subjective: Laying in bed, daughter at bedside.  Stated ambulated in the hallway with her daughter.  Denies any chest pain.  No shortness of breath.  Stated had 9 loose stools last night.  Stools with more consistency.  No bowel movement today yet.  Tolerating current diet but poor  appetite.   Objective: Vitals:   01/19/21 2007 01/20/21 0542 01/20/21 1450 01/21/21 0556  BP: (!) 156/93 (!) 157/100 (!) 170/79 (!) 148/81  Pulse: 80 75 80 85  Resp: 16 16 18 18   Temp: 99 F (37.2 C) 98.9 F (37.2 C) 98.8 F (37.1 C) 98.8 F (37.1 C)  TempSrc: Oral Oral Oral Oral  SpO2: 97% 96% 95% 91%  Weight:      Height:       No intake or output data in the 24 hours ending 01/21/21 1241 Filed Weights   01/16/21 1700  Weight: 102.1 kg    Examination:  General exam: NAD  Respiratory system: CTAB.  No wheezes, no crackles, no rhonchi.  Normal respiratory effort.   Cardiovascular system: Regular rate and rhythm no murmurs rubs or gallops.  No JVD.  No lower extremity edema.  Gastrointestinal system: Abdomen is soft, nondistended, decreased tenderness to palpation in the lower abdominal region, positive bowel sounds.  No rebound.  No guarding. Central nervous system: Alert and oriented. No focal neurological deficits. Extremities: Symmetric 5 x 5 power. Skin: No rashes, lesions or ulcers Psychiatry: Judgement and insight appear normal. Mood & affect appropriate.     Data Reviewed: I have personally reviewed following labs and imaging studies  CBC: Recent Labs  Lab 01/16/21 1420 01/17/21 0543 01/18/21 0456 01/19/21 0523 01/20/21 0610 01/21/21 0530  WBC 9.4 8.1 8.1 7.9 8.9 9.3  NEUTROABS  4.8  --  3.9 3.7 4.4 4.3  HGB 10.0* 9.6* 9.5* 9.5* 9.6* 9.6*  HCT 32.2* 30.5* 29.5* 31.2* 30.8* 31.3*  MCV 99.7 100.3* 98.7 102.3* 99.4 99.7  PLT 288 251 247 250 204 920    Basic Metabolic Panel: Recent Labs  Lab 01/16/21 1420 01/17/21 0543 01/18/21 0456 01/19/21 0523 01/20/21 0610 01/21/21 0530  NA  --  138 140 138 141 138  K  --  3.1* 2.9* 3.4* 3.9 3.7  CL  --  106 108 108 108 104  CO2  --  23 25 24 26 25   GLUCOSE  --  116* 110* 128* 128* 105*  BUN  --  8 8 8 10 9   CREATININE  --  0.53 0.51 0.50 0.55 0.40*  CALCIUM  --  8.2* 8.3* 8.1* 8.5* 8.1*  MG 1.9  --  1.7  1.7 1.8 1.8  PHOS  --   --  2.7 2.3* 2.9  --     GFR: Estimated Creatinine Clearance: 92.4 mL/min (A) (by C-G formula based on SCr of 0.4 mg/dL (L)).  Liver Function Tests: Recent Labs  Lab 01/16/21 1043 01/18/21 0456 01/19/21 0523 01/20/21 0610  AST 28 22 24 26   ALT 14 11 12 12   ALKPHOS 185* 142* 145* 163*  BILITOT 1.1 0.5 0.4 0.5  PROT 7.3 5.8* 5.7* 6.0*  ALBUMIN 2.6* 2.0* 2.0* 2.2*    CBG: No results for input(s): GLUCAP in the last 168 hours.   Recent Results (from the past 240 hour(s))  Blood culture (routine x 2)     Status: None   Collection Time: 01/16/21 11:14 AM   Specimen: BLOOD RIGHT HAND  Result Value Ref Range Status   Specimen Description   Final    BLOOD RIGHT HAND Performed at [facility], [place] [address], [place], [place] [address]    Special Requests   Final    BOTTLES DRAWN AEROBIC AND ANAEROBIC Blood Culture results may not be optimal due to an inadequate volume of blood received in culture bottles Performed at [facility] [address], [place], [place] [address]    Culture   Final    NO GROWTH 5 DAYS Performed at [facility], [place] [address], [place], [place] [address]    Report Status 01/21/2021 FINAL  Final  Gastrointestinal Panel by PCR , Stool     Status: None   Collection Time: 01/16/21 11:28 AM   Specimen: Stool  Result Value Ref Range Status   Campylobacter species NOT DETECTED NOT DETECTED Final   Plesimonas shigelloides NOT DETECTED NOT DETECTED Final   Salmonella species NOT DETECTED NOT DETECTED Final   Yersinia enterocolitica NOT DETECTED NOT DETECTED Final   Vibrio species NOT DETECTED NOT DETECTED Final   Vibrio cholerae NOT DETECTED NOT DETECTED Final   Enteroaggregative E coli (EAEC) NOT DETECTED NOT DETECTED Final   Enteropathogenic E coli (EPEC) NOT DETECTED NOT DETECTED Final   Enterotoxigenic E coli (ETEC) NOT DETECTED NOT DETECTED Final   Shiga like toxin producing E coli (STEC) NOT  DETECTED NOT DETECTED Final   Shigella/Enteroinvasive E coli (EIEC) NOT DETECTED NOT DETECTED Final   Cryptosporidium NOT DETECTED NOT DETECTED Final   Cyclospora cayetanensis NOT DETECTED NOT DETECTED Final   Entamoeba histolytica NOT DETECTED NOT DETECTED Final   Giardia lamblia NOT DETECTED NOT DETECTED Final   Adenovirus F40/41 NOT DETECTED NOT DETECTED Final   Astrovirus NOT DETECTED NOT DETECTED Final   Norovirus  GI/GII NOT DETECTED NOT DETECTED Final   Rotavirus A NOT DETECTED NOT DETECTED Final   Sapovirus (I, II, IV, and V) NOT DETECTED NOT DETECTED Final    Comment: Performed at [facility], [place], [place] [address]  C Difficile Quick Screen w PCR reflex     Status: Abnormal   Collection Time: 01/16/21 11:28 AM   Specimen: Stool  Result Value Ref Range Status   C Diff antigen POSITIVE (A) NEGATIVE Final   C Diff toxin NEGATIVE NEGATIVE Final   C Diff interpretation Results are indeterminate. See PCR results.  Final    Comment: Performed at [facility], [place] [address], [place], [place] [address]  C. Diff by PCR, Reflexed     Status: Abnormal   Collection Time: 01/16/21 11:28 AM  Result Value Ref Range Status   Toxigenic C. Difficile by PCR POSITIVE (A) NEGATIVE Final    Comment: Positive for toxigenic C. difficile with little to no toxin production. Only treat if clinical presentation suggests symptomatic illness. Performed at [facility], [place] [address], [place], [place] [address]   Blood culture (routine x 2)     Status: None   Collection Time: 01/16/21  2:19 PM   Specimen: BLOOD LEFT ARM  Result Value Ref Range Status   Specimen Description   Final    BLOOD LEFT ARM Performed at [facility] [address], [place], [place] [address]    Special Requests   Final    BOTTLES DRAWN AEROBIC AND ANAEROBIC Blood Culture adequate volume Performed at [facility] [address] [place], [place] [address]    Culture   Final    NO GROWTH 5 DAYS Performed at [facility], [place] [address], [place], [place] [address]    Report Status 01/21/2021 FINAL  Final  Aerobic Culture w Gram Stain (superficial specimen)     Status: None   Collection Time: 01/16/21  2:20 PM   Specimen: Abdomen; Wound  Result Value Ref Range Status   Specimen Description   Final    ABDOMEN Performed at [facility] [address], [place], [place] [address]    Special Requests   Final    NONE Performed at [facility], [place] [address], [place], [place] [address]    Gram Stain   Final    FEW WBC PRESENT, PREDOMINANTLY PMN NO ORGANISMS SEEN Performed at [facility], [place] [address], [place], [place] [address]    Culture FEW ACINETOBACTER SPECIES RARE CANDIDA ALBICANS   Final   Report Status 01/20/2021 FINAL  Final   Organism ID, Bacteria ACINETOBACTER SPECIES  Final      Susceptibility   Acinetobacter species - MIC*    CEFTAZIDIME 4 SENSITIVE Sensitive     CIPROFLOXACIN <=0.25 SENSITIVE Sensitive     GENTAMICIN <=1 SENSITIVE Sensitive     IMIPENEM <=0.25 SENSITIVE Sensitive     PIP/TAZO >=128 RESISTANT Resistant     TRIMETH/SULFA <=20 SENSITIVE Sensitive     AMPICILLIN/SULBACTAM <=2 SENSITIVE Sensitive     * FEW ACINETOBACTER SPECIES  Resp Panel by RT-PCR (Flu A&B, Covid) Nasopharyngeal Swab     Status: None   Collection Time: 01/16/21  3:47 PM   Specimen: Nasopharyngeal Swab; Nasopharyngeal(NP) swabs in vial transport medium  Result Value Ref Range Status   SARS Coronavirus 2 by RT PCR NEGATIVE NEGATIVE Final    Comment: (NOTE) SARS-CoV-2  target nucleic acids are NOT DETECTED.  The SARS-CoV-2 RNA is generally detectable in upper respiratory specimens during the acute phase of infection. The lowest concentration of SARS-CoV-2 viral copies this assay can detect is 138 copies/mL. A negative result does not preclude  SARS-Cov-2 infection and should not be used as the sole basis for treatment or other patient management decisions. A negative result may occur with  improper specimen collection/handling, submission of specimen other than nasopharyngeal swab, presence of viral mutation(s) within the areas targeted by this assay, and inadequate number of viral copies(<138 copies/mL). A negative result must be combined with clinical observations, patient history, and epidemiological information. The expected result is Negative.  Fact Sheet for Patients:  EntrepreneurPulse.com.au  Fact Sheet for Healthcare Providers:  IncredibleEmployment.be  This test is no t yet approved or cleared by the [place] FDA and  has been authorized for detection and/or diagnosis of SARS-CoV-2 by FDA under an Emergency Use Authorization (EUA). This EUA will remain  in effect (meaning this test can be used) for the duration of the COVID-19 declaration under Section 564(b)(1) of the Act, 21 U.S.C.section 360bbb-3(b)(1), unless the authorization is terminated  or revoked sooner.       Influenza A by PCR NEGATIVE NEGATIVE Final   Influenza B by PCR NEGATIVE NEGATIVE Final    Comment: (NOTE) The Xpert Xpress SARS-CoV-2/FLU/RSV plus assay is intended as an aid in the diagnosis of influenza from Nasopharyngeal swab specimens and should not be used as a sole basis for treatment. Nasal washings and aspirates are unacceptable for Xpert Xpress SARS-CoV-2/FLU/RSV testing.  Fact Sheet for Patients: EntrepreneurPulse.com.au  Fact Sheet for Healthcare Providers: IncredibleEmployment.be  This test is not yet approved or cleared by the [place] FDA and has been authorized for detection and/or diagnosis of SARS-CoV-2 by FDA under an Emergency Use Authorization (EUA). This EUA will remain in effect (meaning this test can be used) for the duration of  the COVID-19 declaration under Section 564(b)(1) of the Act, 21 U.S.C. section 360bbb-3(b)(1), unless the authorization is terminated or revoked.  Performed at [facility], [place] [address], [place], [place] [address]          Radiology Studies: No results found.      Scheduled Meds: . amLODipine  10 mg Oral Daily  . busPIRone  5 mg Oral Q breakfast   And  . busPIRone  5 mg Oral Q1200   And  . busPIRone  10 mg Oral QHS  . enoxaparin  40 mg Subcutaneous Q24H  . multivitamin with minerals  1 tablet Oral Daily  . nutrition supplement (JUVEN)  1 packet Oral BID WC  . pantoprazole  40 mg Oral q morning  . propranolol  20 mg Oral BID  . Ensure Max Protein  11 oz Oral BID  . saccharomyces boulardii  250 mg Oral BID  . sodium chloride flush  3 mL Intravenous Q12H  . vancomycin  125 mg Oral QID   Continuous Infusions:   LOS: 5 days    Time spent: 35 minutes    Irine Seal, MD [facility]   To contact the attending provider between 7A-7P or the covering provider during after hours 7P-7A, please log into the web site [email or web address] and access using universal [place] password for that web site. If you do not have the password, please call the hospital operator.  01/21/2021, 12:41 PM

## 2021-01-21 NOTE — Progress Notes (Signed)
Wendy Barber is still having some diarrhea.  She says things might be firming up a little bit.  She is not eating all that much.  She says she does not have much taste for food.  This might be from the oral vancomycin that she is taking.  She does have the abdominal pain.  She is on pain medication for this.  She had a couple breakthrough episodes.  She has had no fever.  She has had no bleeding.  She has had no nausea or vomiting.  She says that there is not much drainage out of the abdominal wound.  Her labs show white cell count 9.3.  Hemoglobin 9.6.  Platelet count 261,000.  Her sodium is 138.  Potassium 3.7.  BUN 9 creatinine 0.4.  She is out of bed little bit more.  She is at the chair yesterday.  Her physical exam shows a temperature of 98.8.  Pulse 85.  Blood pressure 148/81.  Her lungs are clear.  Cardiac exam regular rate and rhythm.  Abdomen is soft.  She has no bruits.  She has a dressing over the abdominal wound.  Bowel sounds are present.  There is no guarding or rebound tenderness.  Extremities shows no clubbing, cyanosis or edema.  Wendy Barber has a C. difficile diarrhea.  This is improving.  She is on oral vancomycin.  The abdominal wound is being dressed.  There is no much drainage.  The drainage is not infected.  Hopefully, she will be able to go home over the weekend.  Thankfully, there is nothing from a medical oncology point of view that we have to deal with.  I am very happy about this aspect.  I appreciate the outstanding care that she is getting from all the staff up on [address].  [name], MD  2 Corinthians 5:7

## 2021-01-22 DIAGNOSIS — A0472 Enterocolitis due to Clostridium difficile, not specified as recurrent: Secondary | ICD-10-CM | POA: Diagnosis not present

## 2021-01-22 DIAGNOSIS — T8149XA Infection following a procedure, other surgical site, initial encounter: Secondary | ICD-10-CM | POA: Diagnosis not present

## 2021-01-22 DIAGNOSIS — L02211 Cutaneous abscess of abdominal wall: Secondary | ICD-10-CM | POA: Diagnosis not present

## 2021-01-22 DIAGNOSIS — K754 Autoimmune hepatitis: Secondary | ICD-10-CM | POA: Diagnosis not present

## 2021-01-22 LAB — BASIC METABOLIC PANEL
Anion gap: 6 (ref 5–15)
BUN: 10 mg/dL (ref 6–20)
CO2: 27 mmol/L (ref 22–32)
Calcium: 8.2 mg/dL — ABNORMAL LOW (ref 8.9–10.3)
Chloride: 105 mmol/L (ref 98–111)
Creatinine, Ser: 0.51 mg/dL (ref 0.44–1.00)
GFR, Estimated: >60 mL/min (ref >60)
Glucose, Bld: 118 mg/dL — ABNORMAL HIGH (ref 70–99)
Potassium: 3.9 mmol/L (ref 3.5–5.1)
Sodium: 138 mmol/L (ref 135–145)

## 2021-01-22 LAB — MAGNESIUM: Magnesium: 2 mg/dL (ref 1.7–2.4)

## 2021-01-22 MED ORDER — ACETAMINOPHEN 325 MG PO TABS
650.0000 mg | ORAL_TABLET | ORAL | Status: DC | PRN
  Administered 2021-01-22 – 2021-02-07 (×13): 650 mg via ORAL
  Filled 2021-01-22 (×13): qty 2

## 2021-01-22 NOTE — Care Management Important Message (Signed)
Important Message  Patient Details IM Letter given to the Patient. Name: Wendy Barber MRN: [id number] Date of Birth: 1961-05-14   Medicare Important Message Given:  Yes     Kerin Salen 01/22/2021, 10:08 AM

## 2021-01-22 NOTE — Progress Notes (Signed)
PROGRESS NOTE    Wendy Barber  EUM:[id number] DOB: [birth year] DOA: 01/16/2021 PCP: Glendon Axe, MD    Chief Complaint  Patient presents with  . Diarrhea  . Wound Check    Brief Narrative:  This is a 60 year old female with a past medical history of metastatic appendiceal cancer, pseudomyxoma peritonei and peritoneal carcinomatosiss/p CRS/HIPECand splenectomy in 2005 and recently CRS/HIPEC on 12/10/2020 for recurrent cancer,autoimmune hepatitis,DVT and PE with IVC filtercurrently on Lovenox, hypertension, diabetes who presented to the ED with multiple complaints including intractable diarrhea since her procedure, fatigue and wound drainage this a.m.Since her hospitalization in February she has had persistent diarrhea but tested negative for C. difficile and was started on Imodium and Lomotil with some improvement. She was recently seen by her oncologist in [place] on 3/16 and patient states that she started antibiotics at that time but is unsure what medication. Says that her diarrhea has since worsened. Yesterday she started feeling very fatigued with decreased appetite and increasing pain and this morning she went to the bathroom and had an area of her incision openwith a significant amount of purulent foul-smelling drainage.She denies any fever, chills, chest pain. Has chronic nausea which she takes Zofran multiple times daily for. Of note, patient reports that she is up-to-date with her vaccines in regards to her splenectomy.  Regarding her recent surgery: She iss/pcytoreductive surgery/HIPEC 12/10/2020 at [facility] with major reduction of small bowel, proximal jejunum, ascending colon, partial cystectomy. Postoperative course was notable for anastomotic leak and she was taken back to the OR on POD #10 and closure of abdominal wound on POD #13. She had a JP drain which was recently removed on 3/16.  She was admitted for C diff colitis  and concern for post op wound infection.     Assessment & Plan:   Principal Problem:   Surgical wound infection Active Problems:   Autoimmune hepatitis ([place])   DM2 (diabetes mellitus, type 2) ([place])   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)   DVT of deep femoral vein, left (HCC)   Pulmonary embolism, bilateral (HCC)   Presence of IVC filter   C. difficile enteritis   Abscess of abdominal wall   Hypokalemia  1 abdominal postsurgical wound infection -Patient presented with concerns for abdominal postsurgical wound infection. -Patient afebrile on room air, no leukocytosis. -Status post cytoreductive surgery/HI PEC 12/10/2020 at [facility] in [place], [place] with major reduction of small bowel, proximal jejunum, ascending colon, partial cystectomy. -Postop course for anastomotic leak and taken back to the OR on POD #10 with closure of abdominal wound on POD #13. -Status post JP drain recently removed 01/06/2021. -CT abdomen and pelvis done concerning for anterior abdominal wall ventral surgical wound collection three, 3.2 x 1.8 x 13 cm concerning for infection as well as additional loculated fluid collection in the pelvis superior and anterior to the urinary bladder measuring 5.5 x 2.6 x 3.8 cm. -Asplenic. -General surgery following and suspected superficial wound seroma in abdominal wall, recommend wound packing daily, suspect fluid collection is sterile fluid collection is sterile. -Wound cultures with few actinobacter species, rare Candida albicans. -Discussed with general surgery about wound culture results and they DO NOT feel an active infection at this time and they DO NOT feel anything further to do in addition to current treatment for C. difficile colitis.   -Monitor leukocytosis and if worsening may need to add fluconazole -Continue current pain on hold could likely management. -Saline lock IV  fluids.   -General surgery following  peripherally.  2.  C. difficile enteritis -Patient still with multiple stools however consistency of stools improving.   -Afebrile.   -Continue oral vancomycin.  -Continue probiotic. -Follow.    3.  Metastatic appendiceal cancer, pseudomyxoma peritonei and peritoneal carcinomatosis status post CRS/HI PEC and splenectomy in 2005 and recently CRS/HI PEC on 12/10/2020 for recurrent cancer -Per oncology.  4.  History of DVT and PE status post IVC filter -Continue home dose prophylactic Lovenox.  5.  Hypertension -Norvasc, propranolol.   6.  Essential tremor Propranolol.  7.  Anxiety -BuSpar   8.  Type 2 diabetes -Hemoglobin A1c 5.8 (3/26) -Diet controlled.  9.  Hypokalemia -Secondary to GI losses.   -Potassium at 3.9.  Magnesium at 2.0.   -Follow.    10.  Autoimmune hepatitis -Azathioprine on hold could likely resume once antibiotic course has been completed.    DVT prophylaxis: Lovenox Code Status: Full Family Communication: Updated patient.  No family at bedside. Disposition:   Status is: Inpatient    Dispo: The patient is from: Home              Anticipated d/c is to: Likely home              Patient currently being treated for C. difficile colitis, concern for abdominal wound infections, multiple loose stools, not stable for discharge.   Difficult to place patient no       Consultants:   Oncology: Dr. [name]  General surgery: Dr. Ninfa Linden 01/16/2021  Procedures:   CT abdomen and pelvis 01/16/2021  Antimicrobials:   IV cefepime 01/16/2021>>> 01/17/2021  IV Flagyl 01/16/2021>>>> 01/17/2021  IV vancomycin 01/16/2021>>>> 01/17/2021  Oral vancomycin 01/16/2021>>>>> 01/26/2021   Subjective: Per patient and RN patient noted to have 14 bowel movements between 1 PM yesterday and 8 AM this morning.  Patient states has had 3 bowel movements this morning so far.  Patient with some complaints of lower abdominal pain.  No chest pain.  No shortness of breath.   Patient feeling somewhat discouraged.      Objective: Vitals:   01/21/21 0556 01/21/21 2153 01/22/21 0517 01/22/21 1042  BP: (!) 148/81 (!) 154/87 137/80 (!) 147/84  Pulse: 85 88 77   Resp: 18 20 14    Temp: 98.8 F (37.1 C) 98.5 F (36.9 C) 98.1 F (36.7 C)   TempSrc: Oral Oral Oral   SpO2: 91% 93% 92%   Weight:      Height:        Intake/Output Summary (Last 24 hours) at 01/22/2021 1327 Last data filed at 01/22/2021 0517 Gross per 24 hour  Intake 240 ml  Output --  Net 240 ml   Filed Weights   01/16/21 1700  Weight: 102.1 kg    Examination:  General exam: NAD  Respiratory system: Lungs clear to auscultation bilaterally.  No wheezes, no crackles, no rhonchi.  Normal respiratory effort.   Cardiovascular system: RRR no murmurs rubs or gallops.  No JVD.  No lower extremity edema.  Gastrointestinal system: Abdomen is soft, nondistended, decreased tenderness to palpation lower abdominal region.  Positive bowel sounds.  No rebound.  No guarding.   Central nervous system: Alert and oriented. No focal neurological deficits. Extremities: Symmetric 5 x 5 power. Skin: No rashes, lesions or ulcers Psychiatry: Judgement and insight appear normal. Mood & affect appropriate.     Data Reviewed: I have personally reviewed following labs and imaging studies  CBC: Recent Labs  Lab 01/16/21 1420 01/17/21 0543 01/18/21 0456 01/19/21 0523 01/20/21 0610 01/21/21 0530  WBC 9.4 8.1 8.1 7.9 8.9 9.3  NEUTROABS 4.8  --  3.9 3.7 4.4 4.3  HGB 10.0* 9.6* 9.5* 9.5* 9.6* 9.6*  HCT 32.2* 30.5* 29.5* 31.2* 30.8* 31.3*  MCV 99.7 100.3* 98.7 102.3* 99.4 99.7  PLT 288 251 247 250 204 841    Basic Metabolic Panel: Recent Labs  Lab 01/18/21 0456 01/19/21 0523 01/20/21 0610 01/21/21 0530 01/22/21 0502  NA 140 138 141 138 138  K 2.9* 3.4* 3.9 3.7 3.9  CL 108 108 108 104 105  CO2 25 24 26 25 27   GLUCOSE 110* 128* 128* 105* 118*  BUN 8 8 10 9 10   CREATININE 0.51 0.50 0.55 0.40* 0.51   CALCIUM 8.3* 8.1* 8.5* 8.1* 8.2*  MG 1.7 1.7 1.8 1.8 2.0  PHOS 2.7 2.3* 2.9  --   --     GFR: Estimated Creatinine Clearance: 92.4 mL/min (by C-G formula based on SCr of 0.51 mg/dL).  Liver Function Tests: Recent Labs  Lab 01/16/21 1043 01/18/21 0456 01/19/21 0523 01/20/21 0610  AST 28 22 24 26   ALT 14 11 12 12   ALKPHOS 185* 142* 145* 163*  BILITOT 1.1 0.5 0.4 0.5  PROT 7.3 5.8* 5.7* 6.0*  ALBUMIN 2.6* 2.0* 2.0* 2.2*    CBG: No results for input(s): GLUCAP in the last 168 hours.   Recent Results (from the past 240 hour(s))  Blood culture (routine x 2)     Status: None   Collection Time: 01/16/21 11:14 AM   Specimen: BLOOD RIGHT HAND  Result Value Ref Range Status   Specimen Description   Final    BLOOD RIGHT HAND Performed at [facility], [place] [address], [place], [place] [address]    Special Requests   Final    BOTTLES DRAWN AEROBIC AND ANAEROBIC Blood Culture results may not be optimal due to an inadequate volume of blood received in culture bottles Performed at [facility] [address], [place], [place] [address]    Culture   Final    NO GROWTH 5 DAYS Performed at [facility], [place] [address] [place], [place] [address]    Report Status 01/21/2021 FINAL  Final  Gastrointestinal Panel by PCR , Stool     Status: None   Collection Time: 01/16/21 11:28 AM   Specimen: Stool  Result Value Ref Range Status   Campylobacter species NOT DETECTED NOT DETECTED Final   Plesimonas shigelloides NOT DETECTED NOT DETECTED Final   Salmonella species NOT DETECTED NOT DETECTED Final   Yersinia enterocolitica NOT DETECTED NOT DETECTED Final   Vibrio species NOT DETECTED NOT DETECTED Final   Vibrio cholerae NOT DETECTED NOT DETECTED Final   Enteroaggregative E coli (EAEC) NOT DETECTED NOT DETECTED Final   Enteropathogenic E coli (EPEC) NOT DETECTED NOT DETECTED Final   Enterotoxigenic E coli (ETEC) NOT DETECTED NOT DETECTED Final   Shiga  like toxin producing E coli (STEC) NOT DETECTED NOT DETECTED Final   Shigella/Enteroinvasive E coli (EIEC) NOT DETECTED NOT DETECTED Final   Cryptosporidium NOT DETECTED NOT DETECTED Final   Cyclospora cayetanensis NOT DETECTED NOT DETECTED Final   Entamoeba histolytica NOT DETECTED NOT DETECTED Final   Giardia lamblia NOT DETECTED NOT DETECTED Final   Adenovirus F40/41 NOT DETECTED NOT DETECTED Final   Astrovirus NOT DETECTED NOT DETECTED Final   Norovirus GI/GII NOT DETECTED NOT DETECTED Final   Rotavirus A NOT  DETECTED NOT DETECTED Final   Sapovirus (I, II, IV, and V) NOT DETECTED NOT DETECTED Final    Comment: Performed at [facility], [place], [place] [address]  C Difficile Quick Screen w PCR reflex     Status: Abnormal   Collection Time: 01/16/21 11:28 AM   Specimen: Stool  Result Value Ref Range Status   C Diff antigen POSITIVE (A) NEGATIVE Final   C Diff toxin NEGATIVE NEGATIVE Final   C Diff interpretation Results are indeterminate. See PCR results.  Final    Comment: Performed at [facility], [place] [address] [place], [place] [address]  C. Diff by PCR, Reflexed     Status: Abnormal   Collection Time: 01/16/21 11:28 AM  Result Value Ref Range Status   Toxigenic C. Difficile by PCR POSITIVE (A) NEGATIVE Final    Comment: Positive for toxigenic C. difficile with little to no toxin production. Only treat if clinical presentation suggests symptomatic illness. Performed at [facility], [place] [address] [place], [place] [address]   Blood culture (routine x 2)     Status: None   Collection Time: 01/16/21  2:19 PM   Specimen: BLOOD LEFT ARM  Result Value Ref Range Status   Specimen Description   Final    BLOOD LEFT ARM Performed at [facility] [address] [place], [place] [address]    Special Requests   Final    BOTTLES DRAWN AEROBIC AND ANAEROBIC Blood Culture adequate volume Performed at [facility] [address] [place], [place] [address]    Culture   Final    NO GROWTH 5 DAYS Performed at [facility], [place] [address], [place], [place] [address]    Report Status 01/21/2021 FINAL  Final  Aerobic Culture w Gram Stain (superficial specimen)     Status: None   Collection Time: 01/16/21  2:20 PM   Specimen: Abdomen; Wound  Result Value Ref Range Status   Specimen Description   Final    ABDOMEN Performed at [facility] [address] [place], [place] [address]    Special Requests   Final    NONE Performed at [facility], [place] [address], [place], [place] [address]    Gram Stain   Final    FEW WBC PRESENT, PREDOMINANTLY PMN NO ORGANISMS SEEN Performed at [facility], [place] [address] [place], [place] [address]    Culture FEW ACINETOBACTER SPECIES RARE CANDIDA ALBICANS   Final   Report Status 01/20/2021 FINAL  Final   Organism ID, Bacteria ACINETOBACTER SPECIES  Final      Susceptibility   Acinetobacter species - MIC*    CEFTAZIDIME 4 SENSITIVE Sensitive     CIPROFLOXACIN <=0.25 SENSITIVE Sensitive     GENTAMICIN <=1 SENSITIVE Sensitive     IMIPENEM <=0.25 SENSITIVE Sensitive     PIP/TAZO >=128 RESISTANT Resistant     TRIMETH/SULFA <=20 SENSITIVE Sensitive     AMPICILLIN/SULBACTAM <=2 SENSITIVE Sensitive     * FEW ACINETOBACTER SPECIES  Resp Panel by RT-PCR (Flu A&B, Covid) Nasopharyngeal Swab     Status: None   Collection Time: 01/16/21  3:47 PM   Specimen: Nasopharyngeal Swab; Nasopharyngeal(NP) swabs in vial transport medium  Result Value Ref Range Status   SARS Coronavirus 2 by RT PCR NEGATIVE NEGATIVE Final    Comment: (NOTE) SARS-CoV-2 target nucleic acids are NOT DETECTED.  The SARS-CoV-2 RNA is  generally detectable in upper respiratory specimens during the acute phase of infection. The lowest concentration of SARS-CoV-2 viral copies this assay can detect is 138 copies/mL. A negative  result does not preclude SARS-Cov-2 infection and should not be used as the sole basis for treatment or other patient management decisions. A negative result may occur with  improper specimen collection/handling, submission of specimen other than nasopharyngeal swab, presence of viral mutation(s) within the areas targeted by this assay, and inadequate number of viral copies(<138 copies/mL). A negative result must be combined with clinical observations, patient history, and epidemiological information. The expected result is Negative.  Fact Sheet for Patients:  EntrepreneurPulse.com.au  Fact Sheet for Healthcare Providers:  IncredibleEmployment.be  This test is no t yet approved or cleared by the [place] FDA and  has been authorized for detection and/or diagnosis of SARS-CoV-2 by FDA under an Emergency Use Authorization (EUA). This EUA will remain  in effect (meaning this test can be used) for the duration of the COVID-19 declaration under Section 564(b)(1) of the Act, 21 U.S.C.section 360bbb-3(b)(1), unless the authorization is terminated  or revoked sooner.       Influenza A by PCR NEGATIVE NEGATIVE Final   Influenza B by PCR NEGATIVE NEGATIVE Final    Comment: (NOTE) The Xpert Xpress SARS-CoV-2/FLU/RSV plus assay is intended as an aid in the diagnosis of influenza from Nasopharyngeal swab specimens and should not be used as a sole basis for treatment. Nasal washings and aspirates are unacceptable for Xpert Xpress SARS-CoV-2/FLU/RSV testing.  Fact Sheet for Patients: EntrepreneurPulse.com.au  Fact Sheet for Healthcare Providers: IncredibleEmployment.be  This test is not yet approved or cleared by the [place] FDA and has been authorized for detection and/or diagnosis of SARS-CoV-2 by FDA under an Emergency Use Authorization (EUA). This EUA will remain in effect (meaning this test can be used)  for the duration of the COVID-19 declaration under Section 564(b)(1) of the Act, 21 U.S.C. section 360bbb-3(b)(1), unless the authorization is terminated or revoked.  Performed at [facility], [place] [address] [place], [place] [address]          Radiology Studies: No results found.      Scheduled Meds: . amLODipine  10 mg Oral Daily  . busPIRone  5 mg Oral Q breakfast   And  . busPIRone  5 mg Oral Q1200   And  . busPIRone  10 mg Oral QHS  . enoxaparin  40 mg Subcutaneous Q24H  . multivitamin with minerals  1 tablet Oral Daily  . nutrition supplement (JUVEN)  1 packet Oral BID WC  . pantoprazole  40 mg Oral q morning  . propranolol  20 mg Oral BID  . Ensure Max Protein  11 oz Oral BID  . saccharomyces boulardii  250 mg Oral BID  . sodium chloride flush  3 mL Intravenous Q12H  . vancomycin  125 mg Oral QID   Continuous Infusions:   LOS: 6 days    Time spent: 35 minutes    Irine Seal, MD [facility]   To contact the attending provider between 7A-7P or the covering provider during after hours 7P-7A, please log into the web site [email or web address] and access using universal [place] password for that web site. If you do not have the password, please call the hospital operator.  01/22/2021, 1:27 PM

## 2021-01-23 DIAGNOSIS — L02211 Cutaneous abscess of abdominal wall: Secondary | ICD-10-CM | POA: Diagnosis not present

## 2021-01-23 DIAGNOSIS — T8149XA Infection following a procedure, other surgical site, initial encounter: Secondary | ICD-10-CM | POA: Diagnosis not present

## 2021-01-23 DIAGNOSIS — K754 Autoimmune hepatitis: Secondary | ICD-10-CM | POA: Diagnosis not present

## 2021-01-23 DIAGNOSIS — A0472 Enterocolitis due to Clostridium difficile, not specified as recurrent: Secondary | ICD-10-CM | POA: Diagnosis not present

## 2021-01-23 LAB — RENAL FUNCTION PANEL
Albumin: 2.2 g/dL — ABNORMAL LOW (ref 3.5–5.0)
Anion gap: 9 (ref 5–15)
BUN: 11 mg/dL (ref 6–20)
CO2: 26 mmol/L (ref 22–32)
Calcium: 8.5 mg/dL — ABNORMAL LOW (ref 8.9–10.3)
Chloride: 105 mmol/L (ref 98–111)
Creatinine, Ser: 0.49 mg/dL (ref 0.44–1.00)
GFR, Estimated: >60 mL/min (ref >60)
Glucose, Bld: 113 mg/dL — ABNORMAL HIGH (ref 70–99)
Phosphorus: 3.2 mg/dL (ref 2.5–4.6)
Potassium: 4.1 mmol/L (ref 3.5–5.1)
Sodium: 140 mmol/L (ref 135–145)

## 2021-01-23 LAB — CBC WITH DIFFERENTIAL/PLATELET
Abs Immature Granulocytes: 0.03 10*3/uL (ref 0.00–0.07)
Basophils Absolute: 0.1 10*3/uL (ref 0.0–0.1)
Basophils Relative: 1 %
Eosinophils Absolute: 0.3 10*3/uL (ref 0.0–0.5)
Eosinophils Relative: 3 %
HCT: 33.5 % — ABNORMAL LOW (ref 36.0–46.0)
Hemoglobin: 10.3 g/dL — ABNORMAL LOW (ref 12.0–15.0)
Immature Granulocytes: 0 %
Lymphocytes Relative: 41 %
Lymphs Abs: 3.9 10*3/uL (ref 0.7–4.0)
MCH: 30.6 pg (ref 26.0–34.0)
MCHC: 30.7 g/dL (ref 30.0–36.0)
MCV: 99.4 fL (ref 80.0–100.0)
Monocytes Absolute: 0.9 10*3/uL (ref 0.1–1.0)
Monocytes Relative: 9 %
Neutro Abs: 4.3 10*3/uL (ref 1.7–7.7)
Neutrophils Relative %: 46 %
Platelets: 310 10*3/uL (ref 150–400)
RBC: 3.37 MIL/uL — ABNORMAL LOW (ref 3.87–5.11)
RDW: 20.4 % — ABNORMAL HIGH (ref 11.5–15.5)
WBC: 9.5 10*3/uL (ref 4.0–10.5)
nRBC: 0 % (ref 0.0–0.2)

## 2021-01-23 LAB — MAGNESIUM: Magnesium: 1.9 mg/dL (ref 1.7–2.4)

## 2021-01-23 MED ORDER — PANCRELIPASE (LIP-PROT-AMYL) 12000-38000 UNITS PO CPEP
72000.0000 [IU] | ORAL_CAPSULE | Freq: Three times a day (TID) | ORAL | Status: DC
  Administered 2021-01-23 – 2021-01-24 (×4): 72000 [IU] via ORAL
  Filled 2021-01-23 (×5): qty 6

## 2021-01-23 MED ORDER — VANCOMYCIN 50 MG/ML ORAL SOLUTION
125.0000 mg | Freq: Four times a day (QID) | ORAL | Status: AC
  Administered 2021-01-23 – 2021-01-29 (×25): 125 mg via ORAL
  Filled 2021-01-23 (×26): qty 2.5

## 2021-01-23 MED ORDER — FIDAXOMICIN 200 MG PO TABS
200.0000 mg | ORAL_TABLET | Freq: Two times a day (BID) | ORAL | Status: DC
  Administered 2021-01-23: 200 mg via ORAL
  Filled 2021-01-23: qty 1

## 2021-01-23 NOTE — Progress Notes (Signed)
Wendy Barber still having the diarrhea.  It might be a little bit better.  She went 4 times last night.  There is not much discharge from the abdominal wound.  She is still having some discomfort from the abdominal wound.  She is on her Dilaudid.  I really think that something long-acting would help her.  I have talked to her about a Duragesic patch.  I think she is getting chronic long-term pain control.  I think a Duragesic patch was certainly be worthwhile.  She is going to think about this.  I thought that started on Creon might be able to help with respect to the diarrhea.  There might be an element of short bowel with respect to her diarrhea.  She says that she has lot of stool that "floats."  This might indicate a fatty content.  Maybe, cream might be able to improve absorption so she will decrease her diarrhea.  I do not see a downside to Creon.  Her labs all look pretty good.  Her potassium is doing fine.  Her potassium is 4.1.  Her BUN is 11 creatinine 0.49.  Calcium 8.5.  Her white cell count is 9.5.  Hemoglobin 10.3.  Platelet count 310,000.  She has had no fever.  She has had no bleeding.  She is out of bed a little bit.  She is try taking some more oral intake.  She seems to be having little more pain when she tries to have solids.  Again, she is getting fantastic care from all the staff up on 6 E.  I know this is a very slow process to recover from C. difficile.  I appreciate the fantastic care that she is getting from the staff.  [name],  MD  Philippians 2:11

## 2021-01-23 NOTE — Progress Notes (Signed)
PROGRESS NOTE    Wendy Barber  VQQ:[id number] DOB: [birth year] DOA: 01/16/2021 PCP: Glendon Axe, MD    Chief Complaint  Patient presents with  . Diarrhea  . Wound Check    Brief Narrative:  This is a 60 year old female with a past medical history of metastatic appendiceal cancer, pseudomyxoma peritonei and peritoneal carcinomatosiss/p CRS/HIPECand splenectomy in 2005 and recently CRS/HIPEC on 12/10/2020 for recurrent cancer,autoimmune hepatitis,DVT and PE with IVC filtercurrently on Lovenox, hypertension, diabetes who presented to the ED with multiple complaints including intractable diarrhea since her procedure, fatigue and wound drainage this a.m.Since her hospitalization in February she has had persistent diarrhea but tested negative for C. difficile and was started on Imodium and Lomotil with some improvement. She was recently seen by her oncologist in [place] on 3/16 and patient states that she started antibiotics at that time but is unsure what medication. Says that her diarrhea has since worsened. Yesterday she started feeling very fatigued with decreased appetite and increasing pain and this morning she went to the bathroom and had an area of her incision openwith a significant amount of purulent foul-smelling drainage.She denies any fever, chills, chest pain. Has chronic nausea which she takes Zofran multiple times daily for. Of note, patient reports that she is up-to-date with her vaccines in regards to her splenectomy.  Regarding her recent surgery: She iss/pcytoreductive surgery/HIPEC 12/10/2020 at [facility] with major reduction of small bowel, proximal jejunum, ascending colon, partial cystectomy. Postoperative course was notable for anastomotic leak and she was taken back to the OR on POD #10 and closure of abdominal wound on POD #13. She had a JP drain which was recently removed on 3/16.  She was admitted for C diff colitis  and concern for post op wound infection.     Assessment & Plan:   Principal Problem:   Surgical wound infection Active Problems:   Autoimmune hepatitis ([place])   DM2 (diabetes mellitus, type 2) ([place])   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)   DVT of deep femoral vein, left (HCC)   Pulmonary embolism, bilateral (HCC)   Presence of IVC filter   C. difficile enteritis   Abscess of abdominal wall   Hypokalemia  1 abdominal postsurgical wound infection -Patient presented with concerns for abdominal postsurgical wound infection. -Patient afebrile on room air, no leukocytosis. -Status post cytoreductive surgery/HI PEC 12/10/2020 at [facility] in [place], [place] with major reduction of small bowel, proximal jejunum, ascending colon, partial cystectomy. -Postop course for anastomotic leak and taken back to the OR on POD #10 with closure of abdominal wound on POD #13. -Status post JP drain recently removed 01/06/2021. -CT abdomen and pelvis done concerning for anterior abdominal wall ventral surgical wound collection three, 3.2 x 1.8 x 13 cm concerning for infection as well as additional loculated fluid collection in the pelvis superior and anterior to the urinary bladder measuring 5.5 x 2.6 x 3.8 cm. -Asplenic. -General surgery following and suspected superficial wound seroma in abdominal wall, recommend wound packing daily, suspect fluid collection is sterile fluid collection is sterile. -Wound cultures with few actinobacter species, rare Candida albicans. -Discussed with general surgery about wound culture results and they DO NOT feel an active infection at this time and they DO NOT feel anything further to do in addition to current treatment for C. difficile colitis.   -Monitor leukocytosis and if worsening may need to add fluconazole -Continue current pain on hold could likely management. -Saline lock IV  fluids.   -General surgery following  peripherally.  2.  C. difficile enteritis -Patient with multiple loose stools, consistency slowly improving.   -Afebrile.   -Continue oral vancomycin, probiotic.   -Creon added per oncology due to concern for possible of short-bowel with respect to diarrhea as patient had told oncologist has a lot of stool that floats concerning for fatty content.  -A dose of Dificid was given early on this morning however patient wants to continue oral vancomycin for now as she feels number of stools slowly decreasing.  3.  Metastatic appendiceal cancer, pseudomyxoma peritonei and peritoneal carcinomatosis status post CRS/HI PEC and splenectomy in 2005 and recently CRS/HI PEC on 12/10/2020 for recurrent cancer -Per oncology.  4.  History of DVT and PE status post IVC filter -Continue home dose prophylactic Lovenox.  5.  Hypertension -Controlled on current regimen of propranolol and Norvasc.    6.  Essential tremor Propranolol.   7.  Anxiety -Continue BuSpar.  8.  Type 2 diabetes -Hemoglobin A1c 5.8 (3/26) -Diet controlled.  9.  Hypokalemia -Secondary to GI losses.  -Potassium repleted currently at 4.1.  Magnesium at 1.9  10.  Autoimmune hepatitis -Continue to hold azathioprine and could likely resume once antibiotic course has been completed with clinical improvement.    DVT prophylaxis: Lovenox Code Status: Full Family Communication: Updated patient.  No family at bedside. Disposition:   Status is: Inpatient    Dispo: The patient is from: Home              Anticipated d/c is to: Likely home              Patient currently being treated for C. difficile colitis, ongoing multiple loose stools.  Not stable for discharge.   Difficult to place patient no       Consultants:   Oncology: Dr. [name]  General surgery: Dr. Ninfa Linden 01/16/2021  Procedures:   CT abdomen and pelvis 01/16/2021  Antimicrobials:   IV cefepime 01/16/2021>>> 01/17/2021  IV Flagyl 01/16/2021>>>>  01/17/2021  IV vancomycin 01/16/2021>>>> 01/17/2021  Oral vancomycin 01/16/2021>>>>> 01/26/2021  Oral Dificid x1 dose 01/23/2021   Subjective: Patient tearful.  States had multiple loose stools last night and this morning however feels overall number of stools is decreasing and consistency improving.  Describes some mucus in stool. No chest pain.  No shortness of breath.      Objective: Vitals:   01/22/21 1406 01/22/21 2035 01/23/21 0557 01/23/21 1036  BP: 134/89 (!) 148/87 133/84 (!) 140/95  Pulse: 79 83 79 81  Resp: 18 16 16    Temp: 98.4 F (36.9 C) 98.4 F (36.9 C) 98.5 F (36.9 C)   TempSrc: Oral Oral Oral   SpO2: 95% 95% 93% 96%  Weight:      Height:        Intake/Output Summary (Last 24 hours) at 01/23/2021 1225 Last data filed at 01/22/2021 1408 Gross per 24 hour  Intake 240 ml  Output --  Net 240 ml   Filed Weights   01/16/21 1700  Weight: 102.1 kg    Examination:  General exam: NAD. Respiratory system: CTA B.  No wheezes, no crackles, no rhonchi.  Normal respiratory effort.  Cardiovascular system: Regular rate rhythm no murmurs rubs or gallops.  No JVD.  No lower extremity edema. Gastrointestinal system: Abdomen is soft, nondistended, some decreased tenderness to palpation lower abdominal region.  Positive bowel sounds.  No rebound.  No guarding.  Central nervous system: Alert and oriented.  Moving  extremities spontaneously.  No focal neurological deficits.   Extremities: Symmetric 5 x 5 power. Skin: No rashes, lesions or ulcers Psychiatry: Judgement and insight appear normal. Mood & affect appropriate.     Data Reviewed: I have personally reviewed following labs and imaging studies  CBC: Recent Labs  Lab 01/18/21 0456 01/19/21 0523 01/20/21 0610 01/21/21 0530 01/23/21 0537  WBC 8.1 7.9 8.9 9.3 9.5  NEUTROABS 3.9 3.7 4.4 4.3 4.3  HGB 9.5* 9.5* 9.6* 9.6* 10.3*  HCT 29.5* 31.2* 30.8* 31.3* 33.5*  MCV 98.7 102.3* 99.4 99.7 99.4  PLT 247 250 204 261 310     Basic Metabolic Panel: Recent Labs  Lab 01/18/21 0456 01/19/21 0523 01/20/21 0610 01/21/21 0530 01/22/21 0502 01/23/21 0537  NA 140 138 141 138 138 140  K 2.9* 3.4* 3.9 3.7 3.9 4.1  CL 108 108 108 104 105 105  CO2 25 24 26 25 27 26   GLUCOSE 110* 128* 128* 105* 118* 113*  BUN 8 8 10 9 10 11   CREATININE 0.51 0.50 0.55 0.40* 0.51 0.49  CALCIUM 8.3* 8.1* 8.5* 8.1* 8.2* 8.5*  MG 1.7 1.7 1.8 1.8 2.0 1.9  PHOS 2.7 2.3* 2.9  --   --  3.2    GFR: Estimated Creatinine Clearance: 92.4 mL/min (by C-G formula based on SCr of 0.49 mg/dL).  Liver Function Tests: Recent Labs  Lab 01/18/21 0456 01/19/21 0523 01/20/21 0610 01/23/21 0537  AST 22 24 26   --   ALT 11 12 12   --   ALKPHOS 142* 145* 163*  --   BILITOT 0.5 0.4 0.5  --   PROT 5.8* 5.7* 6.0*  --   ALBUMIN 2.0* 2.0* 2.2* 2.2*    CBG: No results for input(s): GLUCAP in the last 168 hours.   Recent Results (from the past 240 hour(s))  Blood culture (routine x 2)     Status: None   Collection Time: 01/16/21 11:14 AM   Specimen: BLOOD RIGHT HAND  Result Value Ref Range Status   Specimen Description   Final    BLOOD RIGHT HAND Performed at [facility], [place] [address], [place], [place] [address]    Special Requests   Final    BOTTLES DRAWN AEROBIC AND ANAEROBIC Blood Culture results may not be optimal due to an inadequate volume of blood received in culture bottles Performed at [facility] [address] [place], [place] [address]    Culture   Final    NO GROWTH 5 DAYS Performed at [facility], [place] [address], [place], [place] [address]    Report Status 01/21/2021 FINAL  Final  Gastrointestinal Panel by PCR , Stool     Status: None   Collection Time: 01/16/21 11:28 AM   Specimen: Stool  Result Value Ref Range Status   Campylobacter species NOT DETECTED NOT DETECTED Final   Plesimonas shigelloides NOT DETECTED NOT DETECTED Final   Salmonella species NOT DETECTED NOT DETECTED  Final   Yersinia enterocolitica NOT DETECTED NOT DETECTED Final   Vibrio species NOT DETECTED NOT DETECTED Final   Vibrio cholerae NOT DETECTED NOT DETECTED Final   Enteroaggregative E coli (EAEC) NOT DETECTED NOT DETECTED Final   Enteropathogenic E coli (EPEC) NOT DETECTED NOT DETECTED Final   Enterotoxigenic E coli (ETEC) NOT DETECTED NOT DETECTED Final   Shiga like toxin producing E coli (STEC) NOT DETECTED NOT DETECTED Final   Shigella/Enteroinvasive E coli (EIEC) NOT DETECTED NOT DETECTED Final   Cryptosporidium  NOT DETECTED NOT DETECTED Final   Cyclospora cayetanensis NOT DETECTED NOT DETECTED Final   Entamoeba histolytica NOT DETECTED NOT DETECTED Final   Giardia lamblia NOT DETECTED NOT DETECTED Final   Adenovirus F40/41 NOT DETECTED NOT DETECTED Final   Astrovirus NOT DETECTED NOT DETECTED Final   Norovirus GI/GII NOT DETECTED NOT DETECTED Final   Rotavirus A NOT DETECTED NOT DETECTED Final   Sapovirus (I, II, IV, and V) NOT DETECTED NOT DETECTED Final    Comment: Performed at [facility], [place], [place] [address]  C Difficile Quick Screen w PCR reflex     Status: Abnormal   Collection Time: 01/16/21 11:28 AM   Specimen: Stool  Result Value Ref Range Status   C Diff antigen POSITIVE (A) NEGATIVE Final   C Diff toxin NEGATIVE NEGATIVE Final   C Diff interpretation Results are indeterminate. See PCR results.  Final    Comment: Performed at [facility], [place] [address], [place], [place] [address]  C. Diff by PCR, Reflexed     Status: Abnormal   Collection Time: 01/16/21 11:28 AM  Result Value Ref Range Status   Toxigenic C. Difficile by PCR POSITIVE (A) NEGATIVE Final    Comment: Positive for toxigenic C. difficile with little to no toxin production. Only treat if clinical presentation suggests symptomatic illness. Performed at [facility], [place] [address], [place], [place] [address]   Blood culture (routine x 2)      Status: None   Collection Time: 01/16/21  2:19 PM   Specimen: BLOOD LEFT ARM  Result Value Ref Range Status   Specimen Description   Final    BLOOD LEFT ARM Performed at [facility] [address], [place], [place] [address]    Special Requests   Final    BOTTLES DRAWN AEROBIC AND ANAEROBIC Blood Culture adequate volume Performed at [facility] [address] [place], [place] [address]    Culture   Final    NO GROWTH 5 DAYS Performed at [facility], [place] [address] [place], [place] [address]    Report Status 01/21/2021 FINAL  Final  Aerobic Culture w Gram Stain (superficial specimen)     Status: None   Collection Time: 01/16/21  2:20 PM   Specimen: Abdomen; Wound  Result Value Ref Range Status   Specimen Description   Final    ABDOMEN Performed at [facility] [address], [place], [place] [address]    Special Requests   Final    NONE Performed at [facility], [place] [address], [place], [place] [address]    Gram Stain   Final    FEW WBC PRESENT, PREDOMINANTLY PMN NO ORGANISMS SEEN Performed at [facility], [place] [address] [place], [place] [address]    Culture FEW ACINETOBACTER SPECIES RARE CANDIDA ALBICANS   Final   Report Status 01/20/2021 FINAL  Final   Organism ID, Bacteria ACINETOBACTER SPECIES  Final      Susceptibility   Acinetobacter species - MIC*    CEFTAZIDIME 4 SENSITIVE Sensitive     CIPROFLOXACIN <=0.25 SENSITIVE Sensitive     GENTAMICIN <=1 SENSITIVE Sensitive     IMIPENEM <=0.25 SENSITIVE Sensitive     PIP/TAZO >=128 RESISTANT Resistant     TRIMETH/SULFA <=20 SENSITIVE Sensitive     AMPICILLIN/SULBACTAM <=2 SENSITIVE Sensitive     * FEW ACINETOBACTER SPECIES  Resp Panel by RT-PCR (Flu  A&B, Covid) Nasopharyngeal Swab     Status: None   Collection Time: 01/16/21  3:47 PM   Specimen: Nasopharyngeal Swab; Nasopharyngeal(NP) swabs in vial transport medium   Result Value Ref Range Status   SARS Coronavirus 2 by RT PCR NEGATIVE NEGATIVE Final    Comment: (NOTE) SARS-CoV-2 target nucleic acids are NOT DETECTED.  The SARS-CoV-2 RNA is generally detectable in upper respiratory specimens during the acute phase of infection. The lowest concentration of SARS-CoV-2 viral copies this assay can detect is 138 copies/mL. A negative result does not preclude SARS-Cov-2 infection and should not be used as the sole basis for treatment or other patient management decisions. A negative result may occur with  improper specimen collection/handling, submission of specimen other than nasopharyngeal swab, presence of viral mutation(s) within the areas targeted by this assay, and inadequate number of viral copies(<138 copies/mL). A negative result must be combined with clinical observations, patient history, and epidemiological information. The expected result is Negative.  Fact Sheet for Patients:  EntrepreneurPulse.com.au  Fact Sheet for Healthcare Providers:  IncredibleEmployment.be  This test is no t yet approved or cleared by the [place] FDA and  has been authorized for detection and/or diagnosis of SARS-CoV-2 by FDA under an Emergency Use Authorization (EUA). This EUA will remain  in effect (meaning this test can be used) for the duration of the COVID-19 declaration under Section 564(b)(1) of the Act, 21 U.S.C.section 360bbb-3(b)(1), unless the authorization is terminated  or revoked sooner.       Influenza A by PCR NEGATIVE NEGATIVE Final   Influenza B by PCR NEGATIVE NEGATIVE Final    Comment: (NOTE) The Xpert Xpress SARS-CoV-2/FLU/RSV plus assay is intended as an aid in the diagnosis of influenza from Nasopharyngeal swab specimens and should not be used as a sole basis for treatment. Nasal washings and aspirates are unacceptable for Xpert Xpress SARS-CoV-2/FLU/RSV testing.  Fact Sheet for  Patients: EntrepreneurPulse.com.au  Fact Sheet for Healthcare Providers: IncredibleEmployment.be  This test is not yet approved or cleared by the [place] FDA and has been authorized for detection and/or diagnosis of SARS-CoV-2 by FDA under an Emergency Use Authorization (EUA). This EUA will remain in effect (meaning this test can be used) for the duration of the COVID-19 declaration under Section 564(b)(1) of the Act, 21 U.S.C. section 360bbb-3(b)(1), unless the authorization is terminated or revoked.  Performed at [facility], [place] [address], [place], [place] [address]          Radiology Studies: No results found.      Scheduled Meds: . amLODipine  10 mg Oral Daily  . busPIRone  5 mg Oral Q breakfast   And  . busPIRone  5 mg Oral Q1200   And  . busPIRone  10 mg Oral QHS  . enoxaparin  40 mg Subcutaneous Q24H  . fidaxomicin  200 mg Oral BID  . lipase/protease/amylase  72,000 Units Oral TID AC  . multivitamin with minerals  1 tablet Oral Daily  . nutrition supplement (JUVEN)  1 packet Oral BID WC  . pantoprazole  40 mg Oral q morning  . propranolol  20 mg Oral BID  . Ensure Max Protein  11 oz Oral BID  . saccharomyces boulardii  250 mg Oral BID  . sodium chloride flush  3 mL Intravenous Q12H   Continuous Infusions:   LOS: 7 days    Time spent: 35 minutes    Irine Seal, MD [facility]   To contact the  attending provider between 7A-7P or the covering provider during after hours 7P-7A, please log into the web site [email or web address] and access using universal [place] password for that web site. If you do not have the password, please call the hospital operator.  01/23/2021, 12:25 PM

## 2021-01-24 DIAGNOSIS — K754 Autoimmune hepatitis: Secondary | ICD-10-CM | POA: Diagnosis not present

## 2021-01-24 DIAGNOSIS — L02211 Cutaneous abscess of abdominal wall: Secondary | ICD-10-CM | POA: Diagnosis not present

## 2021-01-24 DIAGNOSIS — R197 Diarrhea, unspecified: Secondary | ICD-10-CM | POA: Diagnosis not present

## 2021-01-24 DIAGNOSIS — A0472 Enterocolitis due to Clostridium difficile, not specified as recurrent: Secondary | ICD-10-CM | POA: Diagnosis not present

## 2021-01-24 DIAGNOSIS — T8149XA Infection following a procedure, other surgical site, initial encounter: Secondary | ICD-10-CM | POA: Diagnosis not present

## 2021-01-24 LAB — BASIC METABOLIC PANEL
Anion gap: 9 (ref 5–15)
BUN: 12 mg/dL (ref 6–20)
CO2: 26 mmol/L (ref 22–32)
Calcium: 8.6 mg/dL — ABNORMAL LOW (ref 8.9–10.3)
Chloride: 103 mmol/L (ref 98–111)
Creatinine, Ser: 0.57 mg/dL (ref 0.44–1.00)
GFR, Estimated: >60 mL/min (ref >60)
Glucose, Bld: 113 mg/dL — ABNORMAL HIGH (ref 70–99)
Potassium: 4 mmol/L (ref 3.5–5.1)
Sodium: 138 mmol/L (ref 135–145)

## 2021-01-24 LAB — MAGNESIUM: Magnesium: 1.8 mg/dL (ref 1.7–2.4)

## 2021-01-24 MED ORDER — GERHARDT'S BUTT CREAM
TOPICAL_CREAM | Freq: Four times a day (QID) | CUTANEOUS | Status: DC
  Administered 2021-01-26 – 2021-02-07 (×9): 1 via TOPICAL
  Filled 2021-01-24: qty 1

## 2021-01-24 NOTE — Progress Notes (Signed)
PROGRESS NOTE    Wendy Barber  WEX:[id number] DOB: [birth year] DOA: 01/16/2021 PCP: Glendon Axe, MD    Chief Complaint  Patient presents with  . Diarrhea  . Wound Check    Brief Narrative:  This is a 60 year old female with a past medical history of metastatic appendiceal cancer, pseudomyxoma peritonei and peritoneal carcinomatosiss/p CRS/HIPECand splenectomy in 2005 and recently CRS/HIPEC on 12/10/2020 for recurrent cancer,autoimmune hepatitis,DVT and PE with IVC filtercurrently on Lovenox, hypertension, diabetes who presented to the ED with multiple complaints including intractable diarrhea since her procedure, fatigue and wound drainage this a.m.Since her hospitalization in February she has had persistent diarrhea but tested negative for C. difficile and was started on Imodium and Lomotil with some improvement. She was recently seen by her oncologist in [place] on 3/16 and patient states that she started antibiotics at that time but is unsure what medication. Says that her diarrhea has since worsened. Yesterday she started feeling very fatigued with decreased appetite and increasing pain and this morning she went to the bathroom and had an area of her incision openwith a significant amount of purulent foul-smelling drainage.She denies any fever, chills, chest pain. Has chronic nausea which she takes Zofran multiple times daily for. Of note, patient reports that she is up-to-date with her vaccines in regards to her splenectomy.  Regarding her recent surgery: She iss/pcytoreductive surgery/HIPEC 12/10/2020 at [facility] in [facility] with major reduction of small bowel, proximal jejunum, ascending colon, partial cystectomy. Postoperative course was notable for anastomotic leak and she was taken back to the OR on POD #10 and closure of abdominal wound on POD #13. She had a JP drain which was recently removed on 3/16.  She was admitted for C diff colitis  and concern for post op wound infection.     Assessment & Plan:   Principal Problem:   Surgical wound infection Active Problems:   Autoimmune hepatitis ([name])   DM2 (diabetes mellitus, type 2) ([name])   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)   DVT of deep femoral vein, left (HCC)   Pulmonary embolism, bilateral (HCC)   Presence of IVC filter   C. difficile enteritis   Abscess of abdominal wall   Hypokalemia  1 abdominal postsurgical wound infection -Patient presented with concerns for abdominal postsurgical wound infection. -Patient afebrile on room air, no leukocytosis. -Status post cytoreductive surgery/HI PEC 12/10/2020 at [facility] in [place], [place] with major reduction of small bowel, proximal jejunum, ascending colon, partial cystectomy. -Postop course for anastomotic leak and taken back to the OR on POD #10 with closure of abdominal wound on POD #13. -Status post JP drain recently removed 01/06/2021. -CT abdomen and pelvis done concerning for anterior abdominal wall ventral surgical wound collection three, 3.2 x 1.8 x 13 cm concerning for infection as well as additional loculated fluid collection in the pelvis superior and anterior to the urinary bladder measuring 5.5 x 2.6 x 3.8 cm. -Asplenic. -General surgery following and suspected superficial wound seroma in abdominal wall, recommend wound packing daily, suspect fluid collection is sterile fluid collection is sterile. -Wound cultures with few actinobacter species, rare Candida albicans. -Discussed with general surgery about wound culture results and they DO NOT feel an active infection at this time and they DO NOT feel anything further to do in addition to current treatment for C. difficile colitis.   -Monitor leukocytosis and if worsening may need to add fluconazole -Continue current pain management. -Saline lock IV fluids.   -General  surgery following peripherally.  2.  C. difficile  enteritis -Patient with multiple loose stools, consistency slowly improving.  -Patient describing stool with some mucus noted in. -Patient states had 4 loose stools this morning. -Afebrile.   -Continue oral vancomycin, probiotic.   -Creon added per oncology due to concern for possible of short-bowel with respect to diarrhea as patient had told oncologist has a lot of stool that floats concerning for fatty content.  -A dose of Dificid was given the morning of 01/23/2021, however patient wants to continue oral vancomycin for now as she feels number of stools slowly decreasing. -Continue oral vancomycin.  3.  Metastatic appendiceal cancer, pseudomyxoma peritonei and peritoneal carcinomatosis status post CRS/HI PEC and splenectomy in 2005 and recently CRS/HI PEC on 12/10/2020 for recurrent cancer -Per oncology.  4.  History of DVT and PE status post IVC filter -Continue home regimen prophylactic Lovenox.   5.  Hypertension -Controlled on current regimen of propranolol and Norvasc.    6.  Essential tremor Propranolol.   7.  Anxiety -BuSpar  8.  Type 2 diabetes -Hemoglobin A1c 5.8 (3/26) -Diet controlled.  9.  Hypokalemia -Secondary to GI losses.   -Repleted.  Potassium at 4.  Magnesium 1.8.   10.  Autoimmune hepatitis -Continue to hold azathioprine and could likely resume once antibiotic course has been completed with clinical improvement.    DVT prophylaxis: Lovenox Code Status: Full Family Communication: Updated patient.  No family at bedside. Disposition:   Status is: Inpatient    Dispo: The patient is from: Home              Anticipated d/c is to: Likely home              Patient currently being treated for C. difficile colitis, ongoing multiple loose stools.  Not stable for discharge.   Difficult to place patient no       Consultants:   Oncology: Dr. [name]  General surgery: Dr. Ninfa Linden 01/16/2021  Procedures:   CT abdomen and pelvis  01/16/2021  Antimicrobials:   IV cefepime 01/16/2021>>> 01/17/2021  IV Flagyl 01/16/2021>>>> 01/17/2021  IV vancomycin 01/16/2021>>>> 01/17/2021  Oral vancomycin 01/16/2021>>>>> 01/26/2021  Oral Dificid x1 dose 01/23/2021   Subjective: Patient stated was doing fine until around 10 this morning when she had full loose stools with some consistency also noted some mucus floating on the top.  No chest pain.  No shortness of breath.  Complaining of some mid upper abdominal intermittent pain with some nausea, asking whether may be associated with Creon.      Objective: Vitals:   01/23/21 1036 01/23/21 1444 01/23/21 2137 01/24/21 0641  BP: (!) 140/95 135/81 140/82 (!) 152/86  Pulse: 81 79 89 82  Resp:  18 18 15   Temp:  98.8 F (37.1 C) 98.5 F (36.9 C) 98 F (36.7 C)  TempSrc:  Oral Oral Oral  SpO2: 96% 93% 92% 92%  Weight:      Height:       No intake or output data in the 24 hours ending 01/24/21 1206 Filed Weights   01/16/21 1700  Weight: 102.1 kg    Examination:  General exam: NAD Respiratory system: Clear to auscultation bilaterally anterior lung fields.  No wheezes, no crackles, no rhonchi.  Normal respiratory effort.  Cardiovascular system: RRR no murmurs rubs or gallops.  No JVD.  No lower extremity edema. Gastrointestinal system: Abdomen is soft, nondistended, decreased tenderness to palpation in lower abdominal region.  Positive bowel sounds.  No rebound.  No guarding. Central nervous system: Alert and oriented.  No focal neurological deficits. Extremities: Symmetric 5 x 5 power. Skin: No rashes, lesions or ulcers Psychiatry: Judgement and insight appear normal. Mood & affect appropriate.     Data Reviewed: I have personally reviewed following labs and imaging studies  CBC: Recent Labs  Lab 01/18/21 0456 01/19/21 0523 01/20/21 0610 01/21/21 0530 01/23/21 0537  WBC 8.1 7.9 8.9 9.3 9.5  NEUTROABS 3.9 3.7 4.4 4.3 4.3  HGB 9.5* 9.5* 9.6* 9.6* 10.3*  HCT 29.5*  31.2* 30.8* 31.3* 33.5*  MCV 98.7 102.3* 99.4 99.7 99.4  PLT 247 250 204 261 903    Basic Metabolic Panel: Recent Labs  Lab 01/18/21 0456 01/19/21 0523 01/20/21 0610 01/21/21 0530 01/22/21 0502 01/23/21 0537 01/24/21 0524  NA 140 138 141 138 138 140 138  K 2.9* 3.4* 3.9 3.7 3.9 4.1 4.0  CL 108 108 108 104 105 105 103  CO2 25 24 26 25 27 26 26   GLUCOSE 110* 128* 128* 105* 118* 113* 113*  BUN 8 8 10 9 10 11 12   CREATININE 0.51 0.50 0.55 0.40* 0.51 0.49 0.57  CALCIUM 8.3* 8.1* 8.5* 8.1* 8.2* 8.5* 8.6*  MG 1.7 1.7 1.8 1.8 2.0 1.9 1.8  PHOS 2.7 2.3* 2.9  --   --  3.2  --     GFR: Estimated Creatinine Clearance: 92.4 mL/min (by C-G formula based on SCr of 0.57 mg/dL).  Liver Function Tests: Recent Labs  Lab 01/18/21 0456 01/19/21 0523 01/20/21 0610 01/23/21 0537  AST 22 24 26   --   ALT 11 12 12   --   ALKPHOS 142* 145* 163*  --   BILITOT 0.5 0.4 0.5  --   PROT 5.8* 5.7* 6.0*  --   ALBUMIN 2.0* 2.0* 2.2* 2.2*    CBG: No results for input(s): GLUCAP in the last 168 hours.   Recent Results (from the past 240 hour(s))  Blood culture (routine x 2)     Status: None   Collection Time: 01/16/21 11:14 AM   Specimen: BLOOD RIGHT HAND  Result Value Ref Range Status   Specimen Description   Final    BLOOD RIGHT HAND Performed at [facility], [place] [address] [place], [place] [address]    Special Requests   Final    BOTTLES DRAWN AEROBIC AND ANAEROBIC Blood Culture results may not be optimal due to an inadequate volume of blood received in culture bottles Performed at [facility] [address] [place], [place] [address]    Culture   Final    NO GROWTH 5 DAYS Performed at [facility], [place] [address] [place], [place] [address]    Report Status 01/21/2021 FINAL  Final  Gastrointestinal Panel by PCR , Stool     Status: None   Collection Time: 01/16/21 11:28 AM   Specimen: Stool  Result Value Ref Range Status   Campylobacter species  NOT DETECTED NOT DETECTED Final   Plesimonas shigelloides NOT DETECTED NOT DETECTED Final   Salmonella species NOT DETECTED NOT DETECTED Final   Yersinia enterocolitica NOT DETECTED NOT DETECTED Final   Vibrio species NOT DETECTED NOT DETECTED Final   Vibrio cholerae NOT DETECTED NOT DETECTED Final   Enteroaggregative E coli (EAEC) NOT DETECTED NOT DETECTED Final   Enteropathogenic E coli (EPEC) NOT DETECTED NOT DETECTED Final   Enterotoxigenic E coli (ETEC) NOT DETECTED NOT DETECTED Final   Shiga like toxin producing E  coli (STEC) NOT DETECTED NOT DETECTED Final   Shigella/Enteroinvasive E coli (EIEC) NOT DETECTED NOT DETECTED Final   Cryptosporidium NOT DETECTED NOT DETECTED Final   Cyclospora cayetanensis NOT DETECTED NOT DETECTED Final   Entamoeba histolytica NOT DETECTED NOT DETECTED Final   Giardia lamblia NOT DETECTED NOT DETECTED Final   Adenovirus F40/41 NOT DETECTED NOT DETECTED Final   Astrovirus NOT DETECTED NOT DETECTED Final   Norovirus GI/GII NOT DETECTED NOT DETECTED Final   Rotavirus A NOT DETECTED NOT DETECTED Final   Sapovirus (I, II, IV, and V) NOT DETECTED NOT DETECTED Final    Comment: Performed at [facility], [place] [place], [place] [address]  C Difficile Quick Screen w PCR reflex     Status: Abnormal   Collection Time: 01/16/21 11:28 AM   Specimen: Stool  Result Value Ref Range Status   C Diff antigen POSITIVE (A) NEGATIVE Final   C Diff toxin NEGATIVE NEGATIVE Final   C Diff interpretation Results are indeterminate. See PCR results.  Final    Comment: Performed at [facility], [place] [address] [place], [place] [address]  C. Diff by PCR, Reflexed     Status: Abnormal   Collection Time: 01/16/21 11:28 AM  Result Value Ref Range Status   Toxigenic C. Difficile by PCR POSITIVE (A) NEGATIVE Final    Comment: Positive for toxigenic C. difficile with little to no toxin production. Only treat if clinical presentation suggests  symptomatic illness. Performed at [facility], [place] [address] [place], [place] [address]   Blood culture (routine x 2)     Status: None   Collection Time: 01/16/21  2:19 PM   Specimen: BLOOD LEFT ARM  Result Value Ref Range Status   Specimen Description   Final    BLOOD LEFT ARM Performed at [facility] [address], [place], [place] [address]    Special Requests   Final    BOTTLES DRAWN AEROBIC AND ANAEROBIC Blood Culture adequate volume Performed at [facility] [address], [place], [place] [address]    Culture   Final    NO GROWTH 5 DAYS Performed at [facility], [place] [address], [place], [place] [address]    Report Status 01/21/2021 FINAL  Final  Aerobic Culture w Gram Stain (superficial specimen)     Status: None   Collection Time: 01/16/21  2:20 PM   Specimen: Abdomen; Wound  Result Value Ref Range Status   Specimen Description   Final    ABDOMEN Performed at [facility] [address] [place], [place] [address]    Special Requests   Final    NONE Performed at [facility], [place] [address] [place], [place] [address]    Gram Stain   Final    FEW WBC PRESENT, PREDOMINANTLY PMN NO ORGANISMS SEEN Performed at [facility], [place] [address], [place],  [address]    Culture FEW ACINETOBACTER SPECIES RARE CANDIDA ALBICANS   Final   Report Status 01/20/2021 FINAL  Final   Organism ID, Bacteria ACINETOBACTER SPECIES  Final      Susceptibility   Acinetobacter species - MIC*    CEFTAZIDIME 4 SENSITIVE Sensitive     CIPROFLOXACIN <=0.25 SENSITIVE Sensitive     GENTAMICIN <=1 SENSITIVE Sensitive     IMIPENEM <=0.25 SENSITIVE Sensitive     PIP/TAZO >=128 RESISTANT Resistant     TRIMETH/SULFA <=20 SENSITIVE Sensitive  AMPICILLIN/SULBACTAM <=2 SENSITIVE Sensitive     * FEW ACINETOBACTER SPECIES  Resp Panel by RT-PCR (Flu A&B, Covid) Nasopharyngeal Swab      Status: None   Collection Time: 01/16/21  3:47 PM   Specimen: Nasopharyngeal Swab; Nasopharyngeal(NP) swabs in vial transport medium  Result Value Ref Range Status   SARS Coronavirus 2 by RT PCR NEGATIVE NEGATIVE Final    Comment: (NOTE) SARS-CoV-2 target nucleic acids are NOT DETECTED.  The SARS-CoV-2 RNA is generally detectable in upper respiratory specimens during the acute phase of infection. The lowest concentration of SARS-CoV-2 viral copies this assay can detect is 138 copies/mL. A negative result does not preclude SARS-Cov-2 infection and should not be used as the sole basis for treatment or other patient management decisions. A negative result may occur with  improper specimen collection/handling, submission of specimen other than nasopharyngeal swab, presence of viral mutation(s) within the areas targeted by this assay, and inadequate number of viral copies(<138 copies/mL). A negative result must be combined with clinical observations, patient history, and epidemiological information. The expected result is Negative.  Fact Sheet for Patients:  EntrepreneurPulse.com.au  Fact Sheet for Healthcare Providers:  IncredibleEmployment.be  This test is no t yet approved or cleared by the [place] FDA and  has been authorized for detection and/or diagnosis of SARS-CoV-2 by FDA under an Emergency Use Authorization (EUA). This EUA will remain  in effect (meaning this test can be used) for the duration of the COVID-19 declaration under Section 564(b)(1) of the Act, 21 U.S.C.section 360bbb-3(b)(1), unless the authorization is terminated  or revoked sooner.       Influenza A by PCR NEGATIVE NEGATIVE Final   Influenza B by PCR NEGATIVE NEGATIVE Final    Comment: (NOTE) The Xpert Xpress SARS-CoV-2/FLU/RSV plus assay is intended as an aid in the diagnosis of influenza from Nasopharyngeal swab specimens and should not be used as a sole basis  for treatment. Nasal washings and aspirates are unacceptable for Xpert Xpress SARS-CoV-2/FLU/RSV testing.  Fact Sheet for Patients: EntrepreneurPulse.com.au  Fact Sheet for Healthcare Providers: IncredibleEmployment.be  This test is not yet approved or cleared by the [place] FDA and has been authorized for detection and/or diagnosis of SARS-CoV-2 by FDA under an Emergency Use Authorization (EUA). This EUA will remain in effect (meaning this test can be used) for the duration of the COVID-19 declaration under Section 564(b)(1) of the Act, 21 U.S.C. section 360bbb-3(b)(1), unless the authorization is terminated or revoked.  Performed at [facility], [place] [address] [place],  [address]          Radiology Studies: No results found.      Scheduled Meds: . amLODipine  10 mg Oral Daily  . busPIRone  5 mg Oral Q breakfast   And  . busPIRone  5 mg Oral Q1200   And  . busPIRone  10 mg Oral QHS  . enoxaparin  40 mg Subcutaneous Q24H  . Gerhardt's butt cream   Topical QID  . lipase/protease/amylase  72,000 Units Oral TID AC  . multivitamin with minerals  1 tablet Oral Daily  . nutrition supplement (JUVEN)  1 packet Oral BID WC  . pantoprazole  40 mg Oral q morning  . propranolol  20 mg Oral BID  . saccharomyces boulardii  250 mg Oral BID  . sodium chloride flush  3 mL Intravenous Q12H  . vancomycin  125 mg Oral Q6H   Continuous Infusions:   LOS: 8 days  Time spent: 35 minutes    Irine Seal, MD [facility]   To contact the attending provider between 7A-7P or the covering provider during after hours 7P-7A, please log into the web site [email or web address] and access using universal  password for that web site. If you do not have the password, please call the hospital operator.  01/24/2021, 12:06 PM

## 2021-01-24 NOTE — Consult Note (Signed)
[facility]       Reason for Consult: C diff colitis    Referring Physician: Dr. 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 difficile enteritis   Abscess of abdominal wall   Hypokalemia   . amLODipine  10 mg Oral Daily  . busPIRone  5 mg Oral Q breakfast   And  . busPIRone  5 mg Oral Q1200   And  . busPIRone  10 mg Oral QHS  . enoxaparin  40 mg Subcutaneous Q24H  . Gerhardt's butt cream   Topical QID  . lipase/protease/amylase  72,000 Units Oral TID AC  . multivitamin with minerals  1 tablet Oral Daily  . nutrition supplement (JUVEN)  1 packet Oral BID WC  . pantoprazole  40 mg Oral q morning  . propranolol  20 mg Oral BID  . Ensure Max Protein  11 oz Oral BID  . saccharomyces boulardii  250 mg Oral BID  . sodium chloride flush  3 mL Intravenous Q12H  . vancomycin  125 mg Oral Q6H    Recommendations: Continue oral vancomycin Consider stopping Ensure Avoid unnecessary antibiotics Can change ppi to Pepcid if possible  Assessment: She has ongoing diarrhea with an equivocal C diff test and recent antibiotic use.    Antibiotics: Oral vancomycin  HPI: Wendy Barber is a 60 y.o. female with a history of metastatic appendiceal cancer, pseudomyxoma peritonei and peritoneal carcinomatosis with CRS/HIPEC and splenectomy in 2005 managed in [place] who came in with diarrhea, fatigue and wound drainage.  CT of the abdomen with fluid collection within the anterior abdominal wall at the site of the surgical wound and a fluid collection in the pelvis and evaluated by surgery who did not find concerns for infection.  She has had ongoing diarrhea and has been on recent  antibiotics and C diff testing done and is equivocal with a positive Ag and negative toxin with a positive PCR.  She has been on oral vancomycin and stool output on the chart records a decrease over the last 2 days.  She was offered fidoxamicin but opted to continue with oral vancomycin.  She has been afebrile with a normal WBC and creat wnl.  She is receiving Ensure, protonix and pancrease.  She reports some improvement the last 2 days as well.    Review of Systems:  Constitutional: negative for fevers and chills All other systems reviewed and are negative    Past Medical History:  Diagnosis Date  . Arthritis   . Back pain   . Cancer [facility])    pseudomyxoma peritonei  . 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 Chronic fatigue syndrome   . Diabetes mellitus without complication ([place])   . DVT of deep femoral vein, left ([place]) 03/19/2020  . Fibromyalgia   . Goals of care, counseling/discussion 03/19/2020  . Hypertension   . Malignant pseudomyxoma peritonei ([place]) 03/19/2020  . Presence of IVC filter 03/19/2020  . Pulmonary embolism, bilateral ([place]) 03/19/2020    Social History   Tobacco Use  . Smoking status: Never Smoker  . Smokeless tobacco: Never Used  Substance Use Topics  . Alcohol use: No  .  Drug use: Never    History reviewed. No pertinent family history.  Allergies  Allergen Reactions  . Penicillins Shortness Of Breath    Other reaction(s): Irregular Heart Rate, Other (See Comments) Rapid heartrate   . Alprazolam Hives and Other (See Comments)    Note: tolerates midazolam fine unresponsive Hard to arouse unresponsive Hard to arouse  Other reaction(s): Other (See Comments) unresponsive  . Ativan [Lorazepam] Other (See Comments)    Note: tolerates midazolam fine Face & Throat Swelling.  . Corticosteroids Other (See Comments)    Other reaction(s): Other (see comments) Psychotic behaviour    . Erythromycin     Other  reaction(s): Other (See Comments) Severe stomach pain   . Erythromycin Base Hives  . Milnacipran Hcl Other (See Comments)    Other reaction(s):  psychotic episode     . Prednisone Other (See Comments)    Anxiety & Nervous Breakdown.  Ocie Cornfield  [Milnacipran]   . Povidone Iodine Rash    Had rash under bandage after surgery , has had betadine since without reaction   . Prednisolone Anxiety    Physical Exam: Constitutional: alert  Vitals:   01/23/21 2137 01/24/21 0641  BP: 140/82 (!) 152/86  Pulse: 89 82  Resp: 18 15  Temp: 98.5 F (36.9 C) 98 F (36.7 C)  SpO2: 92% 92%   Patient in bathroom, unable to examine  Lab Results  Component Value Date   WBC 9.5 01/23/2021   HGB 10.3 (L) 01/23/2021   HCT 33.5 (L) 01/23/2021   MCV 99.4 01/23/2021   PLT 310 01/23/2021    Lab Results  Component Value Date   CREATININE 0.57 01/24/2021   BUN 12 01/24/2021   NA 138 01/24/2021   K 4.0 01/24/2021   CL 103 01/24/2021   CO2 26 01/24/2021    Lab Results  Component Value Date   ALT 12 01/20/2021   AST 26 01/20/2021   ALKPHOS 163 (H) 01/20/2021     Microbiology: Recent Results (from the past 240 hour(s))  Blood culture (routine x 2)     Status: None   Collection Time: 01/16/21 11:14 AM   Specimen: BLOOD RIGHT HAND  Result Value Ref Range Status   Specimen Description   Final    BLOOD RIGHT HAND Performed at [facility], [place] [address] [place], [place] [address]    Special Requests   Final    BOTTLES DRAWN AEROBIC AND ANAEROBIC Blood Culture results may not be optimal due to an inadequate volume of blood received in culture bottles Performed at [facility] [address], [place], [place] [address]    Culture   Final    NO GROWTH 5 DAYS Performed at [facility], [place] [address] [place], [place] [address]    Report Status 01/21/2021 FINAL  Final  Gastrointestinal Panel by PCR , Stool     Status: None   Collection Time: 01/16/21  11:28 AM   Specimen: Stool  Result Value Ref Range Status   Campylobacter species NOT DETECTED NOT DETECTED Final   Plesimonas shigelloides NOT DETECTED NOT DETECTED Final   Salmonella species NOT DETECTED NOT DETECTED Final   Yersinia enterocolitica NOT DETECTED NOT DETECTED Final   Vibrio species NOT DETECTED NOT DETECTED Final   Vibrio cholerae NOT DETECTED NOT DETECTED Final   Enteroaggregative E coli (EAEC) NOT DETECTED NOT DETECTED Final   Enteropathogenic E coli (EPEC) NOT DETECTED NOT DETECTED Final   Enterotoxigenic E coli (  ETEC) NOT DETECTED NOT DETECTED Final   Shiga like toxin producing E coli (STEC) NOT DETECTED NOT DETECTED Final   Shigella/Enteroinvasive E coli (EIEC) NOT DETECTED NOT DETECTED Final   Cryptosporidium NOT DETECTED NOT DETECTED Final   Cyclospora cayetanensis NOT DETECTED NOT DETECTED Final   Entamoeba histolytica NOT DETECTED NOT DETECTED Final   Giardia lamblia NOT DETECTED NOT DETECTED Final   Adenovirus F40/41 NOT DETECTED NOT DETECTED Final   Astrovirus NOT DETECTED NOT DETECTED Final   Norovirus GI/GII NOT DETECTED NOT DETECTED Final   Rotavirus A NOT DETECTED NOT DETECTED Final   Sapovirus (I, II, IV, and V) NOT DETECTED NOT DETECTED Final    Comment: Performed at [facility], [place] [place], [place] [address]  C Difficile Quick Screen w PCR reflex     Status: Abnormal   Collection Time: 01/16/21 11:28 AM   Specimen: Stool  Result Value Ref Range Status   C Diff antigen POSITIVE (A) NEGATIVE Final   C Diff toxin NEGATIVE NEGATIVE Final   C Diff interpretation Results are indeterminate. See PCR results.  Final    Comment: Performed at [facility], [place] [address] [place], [place] [address]  C. Diff by PCR, Reflexed     Status: Abnormal   Collection Time: 01/16/21 11:28 AM  Result Value Ref Range Status   Toxigenic C. Difficile by PCR POSITIVE (A) NEGATIVE Final    Comment: Positive for toxigenic C.  difficile with little to no toxin production. Only treat if clinical presentation suggests symptomatic illness. Performed at [facility], [place] [address], [place], [place] [address]   Blood culture (routine x 2)     Status: None   Collection Time: 01/16/21  2:19 PM   Specimen: BLOOD LEFT ARM  Result Value Ref Range Status   Specimen Description   Final    BLOOD LEFT ARM Performed at [facility] [address], [place], [place] [address]    Special Requests   Final    BOTTLES DRAWN AEROBIC AND ANAEROBIC Blood Culture adequate volume Performed at [facility] [address], [place], [place] [address]    Culture   Final    NO GROWTH 5 DAYS Performed at [facility], [place] [address] [place], [place] [address]    Report Status 01/21/2021 FINAL  Final  Aerobic Culture w Gram Stain (superficial specimen)     Status: None   Collection Time: 01/16/21  2:20 PM   Specimen: Abdomen; Wound  Result Value Ref Range Status   Specimen Description   Final    ABDOMEN Performed at [facility] [address] [place], [place] [address]    Special Requests   Final    NONE Performed at [facility], [place] [address] [place], [place] [address]    Gram Stain   Final    FEW WBC PRESENT, PREDOMINANTLY PMN NO ORGANISMS SEEN Performed at [facility], [place] [address], [place], [place] [address]    Culture FEW ACINETOBACTER SPECIES RARE CANDIDA ALBICANS   Final   Report Status 01/20/2021 FINAL  Final   Organism ID, Bacteria ACINETOBACTER SPECIES  Final      Susceptibility   Acinetobacter species - MIC*    CEFTAZIDIME 4 SENSITIVE Sensitive     CIPROFLOXACIN <=0.25 SENSITIVE Sensitive     GENTAMICIN <=1 SENSITIVE Sensitive     IMIPENEM <=0.25 SENSITIVE Sensitive  PIP/TAZO >=128 RESISTANT Resistant     TRIMETH/SULFA <=20 SENSITIVE Sensitive     AMPICILLIN/SULBACTAM <=2 SENSITIVE Sensitive     * FEW  ACINETOBACTER SPECIES  Resp Panel by RT-PCR (Flu A&B, Covid) Nasopharyngeal Swab     Status: None   Collection Time: 01/16/21  3:47 PM   Specimen: Nasopharyngeal Swab; Nasopharyngeal(NP) swabs in vial transport medium  Result Value Ref Range Status   SARS Coronavirus 2 by RT PCR NEGATIVE NEGATIVE Final    Comment: (NOTE) SARS-CoV-2 target nucleic acids are NOT DETECTED.  The SARS-CoV-2 RNA is generally detectable in upper respiratory specimens during the acute phase of infection. The lowest concentration of SARS-CoV-2 viral copies this assay can detect is 138 copies/mL. A negative result does not preclude SARS-Cov-2 infection and should not be used as the sole basis for treatment or other patient management decisions. A negative result may occur with  improper specimen collection/handling, submission of specimen other than nasopharyngeal swab, presence of viral mutation(s) within the areas targeted by this assay, and inadequate number of viral copies(<138 copies/mL). A negative result must be combined with clinical observations, patient history, and epidemiological information. The expected result is Negative.  Fact Sheet for Patients:  EntrepreneurPulse.com.au  Fact Sheet for Healthcare Providers:  IncredibleEmployment.be  This test is no t yet approved or cleared by the [place] FDA and  has been authorized for detection and/or diagnosis of SARS-CoV-2 by FDA under an Emergency Use Authorization (EUA). This EUA will remain  in effect (meaning this test can be used) for the duration of the COVID-19 declaration under Section 564(b)(1) of the Act, 21 U.S.C.section 360bbb-3(b)(1), unless the authorization is terminated  or revoked sooner.       Influenza A by PCR NEGATIVE NEGATIVE Final   Influenza B by PCR NEGATIVE NEGATIVE Final    Comment: (NOTE) The Xpert Xpress SARS-CoV-2/FLU/RSV plus assay is intended as an aid in the diagnosis of  influenza from Nasopharyngeal swab specimens and should not be used as a sole basis for treatment. Nasal washings and aspirates are unacceptable for Xpert Xpress SARS-CoV-2/FLU/RSV testing.  Fact Sheet for Patients: EntrepreneurPulse.com.au  Fact Sheet for Healthcare Providers: IncredibleEmployment.be  This test is not yet approved or cleared by the [place] FDA and has been authorized for detection and/or diagnosis of SARS-CoV-2 by FDA under an Emergency Use Authorization (EUA). This EUA will remain in effect (meaning this test can be used) for the duration of the COVID-19 declaration under Section 564(b)(1) of the Act, 21 U.S.C. section 360bbb-3(b)(1), unless the authorization is terminated or revoked.  Performed at [facility], [place] [address] [place], [place] [address]     Josimar Corning W Calise Dunckel, MD [facility] [email or web address] 01/24/2021, 10:33 AM

## 2021-01-25 DIAGNOSIS — T8149XD Infection following a procedure, other surgical site, subsequent encounter: Secondary | ICD-10-CM

## 2021-01-25 DIAGNOSIS — K754 Autoimmune hepatitis: Secondary | ICD-10-CM | POA: Diagnosis not present

## 2021-01-25 DIAGNOSIS — L02211 Cutaneous abscess of abdominal wall: Secondary | ICD-10-CM | POA: Diagnosis not present

## 2021-01-25 DIAGNOSIS — A0472 Enterocolitis due to Clostridium difficile, not specified as recurrent: Secondary | ICD-10-CM | POA: Diagnosis not present

## 2021-01-25 DIAGNOSIS — T8149XA Infection following a procedure, other surgical site, initial encounter: Secondary | ICD-10-CM | POA: Diagnosis not present

## 2021-01-25 LAB — CBC WITH DIFFERENTIAL/PLATELET
Abs Immature Granulocytes: 0.03 10*3/uL (ref 0.00–0.07)
Basophils Absolute: 0.1 10*3/uL (ref 0.0–0.1)
Basophils Relative: 1 %
Eosinophils Absolute: 0.3 10*3/uL (ref 0.0–0.5)
Eosinophils Relative: 4 %
HCT: 31.8 % — ABNORMAL LOW (ref 36.0–46.0)
Hemoglobin: 9.8 g/dL — ABNORMAL LOW (ref 12.0–15.0)
Immature Granulocytes: 0 %
Lymphocytes Relative: 45 %
Lymphs Abs: 4.3 10*3/uL — ABNORMAL HIGH (ref 0.7–4.0)
MCH: 30.7 pg (ref 26.0–34.0)
MCHC: 30.8 g/dL (ref 30.0–36.0)
MCV: 99.7 fL (ref 80.0–100.0)
Monocytes Absolute: 1.1 10*3/uL — ABNORMAL HIGH (ref 0.1–1.0)
Monocytes Relative: 12 %
Neutro Abs: 3.5 10*3/uL (ref 1.7–7.7)
Neutrophils Relative %: 38 %
Platelets: 347 10*3/uL (ref 150–400)
RBC: 3.19 MIL/uL — ABNORMAL LOW (ref 3.87–5.11)
RDW: 19.8 % — ABNORMAL HIGH (ref 11.5–15.5)
WBC: 9.3 10*3/uL (ref 4.0–10.5)
nRBC: 0 % (ref 0.0–0.2)

## 2021-01-25 LAB — MAGNESIUM: Magnesium: 1.8 mg/dL (ref 1.7–2.4)

## 2021-01-25 LAB — RENAL FUNCTION PANEL
Albumin: 2.3 g/dL — ABNORMAL LOW (ref 3.5–5.0)
Anion gap: 6 (ref 5–15)
BUN: 11 mg/dL (ref 6–20)
CO2: 28 mmol/L (ref 22–32)
Calcium: 8.4 mg/dL — ABNORMAL LOW (ref 8.9–10.3)
Chloride: 105 mmol/L (ref 98–111)
Creatinine, Ser: 0.41 mg/dL — ABNORMAL LOW (ref 0.44–1.00)
GFR, Estimated: >60 mL/min (ref >60)
Glucose, Bld: 112 mg/dL — ABNORMAL HIGH (ref 70–99)
Phosphorus: 4 mg/dL (ref 2.5–4.6)
Potassium: 3.5 mmol/L (ref 3.5–5.1)
Sodium: 139 mmol/L (ref 135–145)

## 2021-01-25 MED ORDER — FAMOTIDINE 20 MG PO TABS: 40.0000 mg | ORAL_TABLET | Freq: Every day | ORAL | Status: DC

## 2021-01-25 MED ORDER — DIPHENOXYLATE-ATROPINE 2.5-0.025 MG PO TABS
2.0000 | ORAL_TABLET | Freq: Once | ORAL | Status: AC
  Administered 2021-01-25: 2 via ORAL
  Filled 2021-01-25: qty 2

## 2021-01-25 MED ORDER — POTASSIUM CHLORIDE CRYS ER 10 MEQ PO TBCR
40.0000 meq | EXTENDED_RELEASE_TABLET | Freq: Once | ORAL | Status: AC
  Administered 2021-01-25: 40 meq via ORAL
  Filled 2021-01-25: qty 4

## 2021-01-25 MED ORDER — FAMOTIDINE 20 MG PO TABS
40.0000 mg | ORAL_TABLET | Freq: Every day | ORAL | Status: DC
  Administered 2021-01-25 – 2021-02-10 (×17): 40 mg via ORAL
  Filled 2021-01-25 (×18): qty 2

## 2021-01-25 MED ORDER — FAMOTIDINE 20 MG PO TABS
20.0000 mg | ORAL_TABLET | Freq: Every day | ORAL | Status: DC
  Administered 2021-01-25: 20 mg via ORAL
  Filled 2021-01-25: qty 1

## 2021-01-25 MED ORDER — MAGNESIUM SULFATE 2 GM/50ML IV SOLN
2.0000 g | Freq: Once | INTRAVENOUS | Status: AC
  Administered 2021-01-25: 2 g via INTRAVENOUS
  Filled 2021-01-25: qty 50

## 2021-01-25 MED ORDER — CALCIUM POLYCARBOPHIL 625 MG PO TABS
625.0000 mg | ORAL_TABLET | Freq: Every day | ORAL | Status: DC
  Administered 2021-01-25 – 2021-02-10 (×17): 625 mg via ORAL
  Filled 2021-01-25 (×18): qty 1

## 2021-01-25 MED ORDER — CALCIUM CARBONATE ANTACID 500 MG PO CHEW
1.0000 | CHEWABLE_TABLET | Freq: Three times a day (TID) | ORAL | Status: DC | PRN
  Administered 2021-01-29 – 2021-02-04 (×2): 200 mg via ORAL
  Filled 2021-01-25 (×2): qty 1

## 2021-01-25 NOTE — Progress Notes (Signed)
[facility]  Date of Admission:  01/16/2021     CC: Diarrhea Hx of peritonitis/surgical wound infection  Lines: Peripheral iv's  Abx: 3/26-c oral vancomycin  3/26-3/27 vanc/cefepime/flagyl  ASSESSMENT: 60 y.o. female with a history of metastatic appendiceal cancer, pseudomyxoma peritonei and peritoneal carcinomatosis, with initial CRS/HIPEC and splenectomy in 2005 [facility]) and recent repeat CRS/HIPEC 12/10/2020, post op course complicated by anastomotic leak/?peritonitis/surgical site infection s/p exlap and I&D 3/04 and abx course, admitted for acute on chronic diarrhea with question of cdiff infection   #cdiff diarrhea Diarrhea appears within days after the 12/10/20 crs/hipec. It got worse with the abx after the exlap on 3/02, and she was receiving lomotil supportive care. Sx was even worse more than 8-10 times a day a week or so before she was admitted 3/26, and associated with new cramping cdiff testing --> Ag eia/toxin pcr positive but actual toxin negative. Previously at [facility] cdiff toxin pcr was negative No fever/leukocytosis Minimal improvement in diarrhea frequency and abd cramping despite almost 2 weeks vancomycin  She has other reasons to have diarrhea and report that the boost especially make it worse. S/p 1 meter resection small bowel and recent abx and perhaps some component of ibs all contribute  -agree with supportive care (fiber, prn lomotil for titration to 3-6 bm a day or less -please review meds and see if can d/c not needed agents with potential diarrhea contribution -avoid systemic abx at this time -I wouldn't add other cdiff treatment at this time; finish total 14 days po vanc on 4/08     #hx anastomotic leak/surgical site infection abd wound She is s/p exlap and anastomotic leak repair 3/02 and had a course of Abx at [facility], finishing with moxifloxacin on 3/16 The wound incision had almost  completely healed. The previous incision dehiscence superiorly had closed, and inferiorly a small spot with serous discharge minimally The cramping she described is deep not superficial to reflect the surgical site infection She has an abd ct scan this admission which described an abd wall fluid-gas collection and pelvis fluid collection.  Surgery had evaluated her and suspect this is seroma post-op She has a ?superficial wound swab on 3/26 that showed candida and acinetobacter species. Exam with minimal tenderness abd wall and no erythema/fluctuance  -From id perspective, difficult to ascertain whether a fluid collection is infected or just a postop seroma in this case and she had received prior recent abx making culture dx difficult. given diarrhea potentially could be worse with abx and risk for cdiff, and nontoxic clinical picture, would watch off abx and see if the fluid collection would show sign of infection then we can investigate and treat accordingly -ID will sign off, please call if further question    I spent more than 35 minute reviewing data/chart, and coordinating care and >50% direct face to face time providing counseling/discussing diagnostics/treatment plan with patient   ----------------------------------- Principal Problem:   Surgical wound infection Active Problems:   Autoimmune hepatitis ([place])   DM2 (diabetes mellitus, type 2) ([place])   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[place])   DVT of deep femoral vein, left ([place])   Pulmonary embolism, bilateral ([facility])   Presence of IVC filter   C. difficile enteritis   Abscess of abdominal wall   Hypokalemia   Allergies  Allergen Reactions  . Penicillins Shortness Of Breath    Other reaction(s): Irregular Heart Rate,  Other (See Comments) Rapid heartrate   . Alprazolam Hives and Other (See Comments)    Note: tolerates midazolam fine unresponsive Hard to arouse unresponsive Hard to arouse  Other  reaction(s): Other (See Comments) unresponsive  . Ativan [Lorazepam] Other (See Comments)    Note: tolerates midazolam fine Face & Throat Swelling.  . Corticosteroids Other (See Comments)    Other reaction(s): Other (see comments) Psychotic behaviour    . Erythromycin     Other reaction(s): Other (See Comments) Severe stomach pain   . Erythromycin Base Hives  . Milnacipran Hcl Other (See Comments)    Other reaction(s):  psychotic episode     . Prednisone Other (See Comments)    Anxiety & Nervous Breakdown.  Ocie Cornfield  [Milnacipran]   . Povidone Iodine Rash    Had rash under bandage after surgery , has had betadine since without reaction   . Prednisolone Anxiety    Scheduled Meds: . amLODipine  10 mg Oral Daily  . busPIRone  5 mg Oral Q breakfast   And  . busPIRone  5 mg Oral Q1200   And  . busPIRone  10 mg Oral QHS  . enoxaparin  40 mg Subcutaneous Q24H  . famotidine  40 mg Oral Daily  . Gerhardt's butt cream   Topical QID  . multivitamin with minerals  1 tablet Oral Daily  . propranolol  20 mg Oral BID  . saccharomyces boulardii  250 mg Oral BID  . sodium chloride flush  3 mL Intravenous Q12H  . vancomycin  125 mg Oral Q6H   Continuous Infusions: PRN Meds:.acetaminophen, calcium carbonate, diclofenac Sodium, hydrALAZINE, HYDROmorphone (DILAUDID) injection, HYDROmorphone, ondansetron **OR** ondansetron (ZOFRAN) IV, simethicone   SUBJECTIVE: I reviewed chart and careevery where record  Patient's diarrhea is less frequent but still significant 8 times a day or so.  It is significantly better since she stops the boost  She remains on po vancomycin  No f/c abd incision small dehisced area still just minimal serous output    Review of Systems: ROS All other ROS was negative, except mentioned above     OBJECTIVE: Vitals:   01/24/21 0641 01/24/21 1352 01/24/21 2346 01/25/21 0605  BP: (!) 152/86 (!) 147/91 (!) 142/79 (!) 146/82  Pulse: 82 84 81 77   Resp: 15 17 16 16   Temp: 98 F (36.7 C) 98.2 F (36.8 C) 99.1 F (37.3 C) 99 F (37.2 C)  TempSrc: Oral Oral Oral Oral  SpO2: 92% 93% 91% 94%  Weight:      Height:       Body mass index is 36.32 kg/m.  Physical Exam Well developed, no distress, conversant Heent: per; conj clear; eomi; normocephalic Neck supple cv rrr no mrg Lungs clear abd soft, mildly tender; no rebound; incision mostly healed; 1 spot 2-3 mm inferior of incision still open with clear discharge Ext no edema Neuro nonfocal  Lab Results Lab Results  Component Value Date   WBC 9.3 01/25/2021   HGB 9.8 (L) 01/25/2021   HCT 31.8 (L) 01/25/2021   MCV 99.7 01/25/2021   PLT 347 01/25/2021    Lab Results  Component Value Date   CREATININE 0.41 (L) 01/25/2021   BUN 11 01/25/2021   NA 139 01/25/2021   K 3.5 01/25/2021   CL 105 01/25/2021   CO2 28 01/25/2021    Lab Results  Component Value Date   ALT 12 01/20/2021   AST 26 01/20/2021   ALKPHOS 163 (H) 01/20/2021  BILITOT 0.5 01/20/2021      Microbiology: Recent Results (from the past 240 hour(s))  Blood culture (routine x 2)     Status: None   Collection Time: 01/16/21 11:14 AM   Specimen: BLOOD RIGHT HAND  Result Value Ref Range Status   Specimen Description   Final    BLOOD RIGHT HAND Performed at [facility], [place] [address] [place], [place] [address]    Special Requests   Final    BOTTLES DRAWN AEROBIC AND ANAEROBIC Blood Culture results may not be optimal due to an inadequate volume of blood received in culture bottles Performed at [facility] [address] [place], [place] [address]    Culture   Final    NO GROWTH 5 DAYS Performed at [facility], [place] [address], [place], [place] [address]    Report Status 01/21/2021 FINAL  Final  Gastrointestinal Panel by PCR , Stool     Status: None   Collection Time: 01/16/21 11:28 AM   Specimen: Stool  Result Value Ref Range Status   Campylobacter species  NOT DETECTED NOT DETECTED Final   Plesimonas shigelloides NOT DETECTED NOT DETECTED Final   Salmonella species NOT DETECTED NOT DETECTED Final   Yersinia enterocolitica NOT DETECTED NOT DETECTED Final   Vibrio species NOT DETECTED NOT DETECTED Final   Vibrio cholerae NOT DETECTED NOT DETECTED Final   Enteroaggregative E coli (EAEC) NOT DETECTED NOT DETECTED Final   Enteropathogenic E coli (EPEC) NOT DETECTED NOT DETECTED Final   Enterotoxigenic E coli (ETEC) NOT DETECTED NOT DETECTED Final   Shiga like toxin producing E coli (STEC) NOT DETECTED NOT DETECTED Final   Shigella/Enteroinvasive E coli (EIEC) NOT DETECTED NOT DETECTED Final   Cryptosporidium NOT DETECTED NOT DETECTED Final   Cyclospora cayetanensis NOT DETECTED NOT DETECTED Final   Entamoeba histolytica NOT DETECTED NOT DETECTED Final   Giardia lamblia NOT DETECTED NOT DETECTED Final   Adenovirus F40/41 NOT DETECTED NOT DETECTED Final   Astrovirus NOT DETECTED NOT DETECTED Final   Norovirus GI/GII NOT DETECTED NOT DETECTED Final   Rotavirus A NOT DETECTED NOT DETECTED Final   Sapovirus (I, II, IV, and V) NOT DETECTED NOT DETECTED Final    Comment: Performed at [facility], [address] [place], [place] [address]  C Difficile Quick Screen w PCR reflex     Status: Abnormal   Collection Time: 01/16/21 11:28 AM   Specimen: Stool  Result Value Ref Range Status   C Diff antigen POSITIVE (A) NEGATIVE Final   C Diff toxin NEGATIVE NEGATIVE Final   C Diff interpretation Results are indeterminate. See PCR results.  Final    Comment: Performed at [facility], [place] [address], [place], [place] [address]  C. Diff by PCR, Reflexed     Status: Abnormal   Collection Time: 01/16/21 11:28 AM  Result Value Ref Range Status   Toxigenic C. Difficile by PCR POSITIVE (A) NEGATIVE Final    Comment: Positive for toxigenic C. difficile with little to no toxin production. Only treat if clinical presentation suggests  symptomatic illness. Performed at [facility], [place] [address], [place], [place] [address]   Blood culture (routine x 2)     Status: None   Collection Time: 01/16/21  2:19 PM   Specimen: BLOOD LEFT ARM  Result Value Ref Range Status   Specimen Description   Final    BLOOD LEFT ARM Performed at [facility], [place] [address], [place], [place] [address]    Special Requests   Final    BOTTLES DRAWN AEROBIC AND ANAEROBIC Blood Culture adequate volume Performed at [place] [address], [place], [place] [address]    Culture   Final    NO GROWTH 5 DAYS Performed at [facility], [place] [address], [place], [place] [address]    Report Status 01/21/2021 FINAL  Final  Aerobic Culture w Gram Stain (superficial specimen)     Status: None   Collection Time: 01/16/21  2:20 PM   Specimen: Abdomen; Wound  Result Value Ref Range Status   Specimen Description   Final    ABDOMEN Performed at [facility] [address] [place], [place] [address]    Special Requests   Final    NONE Performed at [name], [place] [address] [place], [place] [address]    Gram Stain   Final    FEW WBC PRESENT, PREDOMINANTLY PMN NO ORGANISMS SEEN Performed at [facility], [place] [address] [place], [place] [address]    Culture FEW ACINETOBACTER SPECIES RARE CANDIDA ALBICANS   Final   Report Status 01/20/2021 FINAL  Final   Organism ID, Bacteria ACINETOBACTER SPECIES  Final      Susceptibility   Acinetobacter species - MIC*    CEFTAZIDIME 4 SENSITIVE Sensitive     CIPROFLOXACIN <=0.25 SENSITIVE Sensitive     GENTAMICIN <=1 SENSITIVE Sensitive     IMIPENEM <=0.25 SENSITIVE Sensitive     PIP/TAZO >=128 RESISTANT Resistant     TRIMETH/SULFA <=20 SENSITIVE Sensitive     AMPICILLIN/SULBACTAM <=2 SENSITIVE Sensitive     * FEW ACINETOBACTER SPECIES  Resp Panel by RT-PCR (Flu A&B, Covid) Nasopharyngeal Swab      Status: None   Collection Time: 01/16/21  3:47 PM   Specimen: Nasopharyngeal Swab; Nasopharyngeal(NP) swabs in vial transport medium  Result Value Ref Range Status   SARS Coronavirus 2 by RT PCR NEGATIVE NEGATIVE Final    Comment: (NOTE) SARS-CoV-2 target nucleic acids are NOT DETECTED.  The SARS-CoV-2 RNA is generally detectable in upper respiratory specimens during the acute phase of infection. The lowest concentration of SARS-CoV-2 viral copies this assay can detect is 138 copies/mL. A negative result does not preclude SARS-Cov-2 infection and should not be used as the sole basis for treatment or other patient management decisions. A negative result may occur with  improper specimen collection/handling, submission of specimen other than nasopharyngeal swab, presence of viral mutation(s) within the areas targeted by this assay, and inadequate number of viral copies(<138 copies/mL). A negative result must be combined with clinical observations, patient history, and epidemiological information. The expected result is Negative.  Fact Sheet for Patients:  EntrepreneurPulse.com.au  Fact Sheet for Healthcare Providers:  IncredibleEmployment.be  This test is no t yet approved or cleared by the [place] FDA and  has been authorized for detection and/or diagnosis of SARS-CoV-2 by FDA under an Emergency Use Authorization (EUA). This EUA will remain  in effect (meaning this test can be used) for the duration of the COVID-19 declaration under Section 564(b)(1) of the Act, 21 U.S.C.section 360bbb-3(b)(1), unless the authorization is terminated  or revoked sooner.       Influenza A by PCR NEGATIVE NEGATIVE Final   Influenza B by PCR NEGATIVE NEGATIVE Final    Comment: (NOTE) The Xpert Xpress SARS-CoV-2/FLU/RSV plus assay is intended as an aid in the diagnosis of influenza  from Nasopharyngeal swab specimens and should not be used as a sole basis  for treatment. Nasal washings and aspirates are unacceptable for Xpert Xpress SARS-CoV-2/FLU/RSV testing.  Fact Sheet for Patients: EntrepreneurPulse.com.au  Fact Sheet for Healthcare Providers: IncredibleEmployment.be  This test is not yet approved or cleared by the [place] FDA and has been authorized for detection and/or diagnosis of SARS-CoV-2 by FDA under an Emergency Use Authorization (EUA). This EUA will remain in effect (meaning this test can be used) for the duration of the COVID-19 declaration under Section 564(b)(1) of the Act, 21 U.S.C. section 360bbb-3(b)(1), unless the authorization is terminated or revoked.  Performed at [facility], [place] [address], [place], [place] [address]      Serology: 3/26 gi pcr negative; cdiff pcr  Positive/eia positive, toxin negative  Imaging: If present, new imagings (plain films, ct scans, and mri) have been personally visualized and interpreted; radiology reports have been reviewed. Decision making incorporated into the Impression / Recommendations.  3/26 abd pelv ct with contrast Gas and fluid collection within the anterior abdominal wall ventral surgical wound measuring 3.2 x 1.8 x 13 cmconcerning for wound infection.  Additional loculated fluid collection in the pelvis just superior and anterior to the urinary bladder measuring 5.5 x 2.6 x 3.8 cm, question sterile versus infected postoperative collection.  Scattered infiltrative changes of mesenteric and abdominal fat which may be related to interval surgery, though cannot exclude involvement by peritoneal tumor.  Small amount of free air extending into the surgical wound.  Diffuse colonic and small bowel wall thickening which may be related to recent abdominal/intestinal surgery though enteritis not excluded  Nodular contour of the liver question cirrhosis.  Small posterior LEFT diaphragmatic hernia  containing fat.  Aortic Atherosclerosis  Jabier Mutton, [facility] [place] [phone] pager    01/25/2021, 2:47 PM

## 2021-01-25 NOTE — Care Management Important Message (Signed)
Important Message  Patient Details IM Letter given to the Patient. Name: Nefertari Rebman MRN: [id number] Date of Birth: [birth year]   Medicare Important Message Given:  Yes     Kerin Salen 01/25/2021, 10:22 AM

## 2021-01-25 NOTE — Progress Notes (Signed)
PROGRESS NOTE    Wendy Barber  GGE:[id number] DOB: Sep 02, 1961 DOA: 01/16/2021 PCP: Glendon Axe, MD    Chief Complaint  Patient presents with  . Diarrhea  . Wound Check    Brief Narrative:  This is a 60 year old female with a past medical history of metastatic appendiceal cancer, pseudomyxoma peritonei and peritoneal carcinomatosiss/p CRS/HIPECand splenectomy in 2005 and recently CRS/HIPEC on 12/10/2020 for recurrent cancer,autoimmune hepatitis,DVT and PE with IVC filtercurrently on Lovenox, hypertension, diabetes who presented to the ED with multiple complaints including intractable diarrhea since her procedure, fatigue and wound drainage this a.m.Since her hospitalization in February she has had persistent diarrhea but tested negative for C. difficile and was started on Imodium and Lomotil with some improvement. She was recently seen by her oncologist in [place] on 3/16 and patient states that she started antibiotics at that time but is unsure what medication. Says that her diarrhea has since worsened. Yesterday she started feeling very fatigued with decreased appetite and increasing pain and this morning she went to the bathroom and had an area of her incision openwith a significant amount of purulent foul-smelling drainage.She denies any fever, chills, chest pain. Has chronic nausea which she takes Zofran multiple times daily for. Of note, patient reports that she is up-to-date with her vaccines in regards to her splenectomy.  Regarding her recent surgery: She iss/pcytoreductive surgery/HIPEC 12/10/2020 at [facility] in [facility] with major reduction of small bowel, proximal jejunum, ascending colon, partial cystectomy. Postoperative course was notable for anastomotic leak and she was taken back to the OR on POD #10 and closure of abdominal wound on POD #13. She had a JP drain which was recently removed on 3/16.  She was admitted for C diff colitis  and concern for post op wound infection.     Assessment & Plan:   Principal Problem:   Surgical wound infection Active Problems:   Autoimmune hepatitis ([place])   DM2 (diabetes mellitus, type 2) ([place])   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)   DVT of deep femoral vein, left (HCC)   Pulmonary embolism, bilateral (HCC)   Presence of IVC filter   C. difficile enteritis   Abscess of abdominal wall   Hypokalemia  1 abdominal postsurgical wound infection -Patient presented with concerns for abdominal postsurgical wound infection. -Patient afebrile on room air, no leukocytosis. -Status post cytoreductive surgery/HI PEC 12/10/2020 at [facility] in [place], [place] with major reduction of small bowel, proximal jejunum, ascending colon, partial cystectomy. -Postop course for anastomotic leak and taken back to the OR on POD #10 with closure of abdominal wound on POD #13. -Status post JP drain recently removed 01/06/2021. -CT abdomen and pelvis done concerning for anterior abdominal wall ventral surgical wound collection three, 3.2 x 1.8 x 13 cm concerning for infection as well as additional loculated fluid collection in the pelvis superior and anterior to the urinary bladder measuring 5.5 x 2.6 x 3.8 cm. -Asplenic. -General surgery following and suspected superficial wound seroma in abdominal wall, recommend wound packing daily, suspect fluid collection is sterile fluid collection is sterile. -Wound cultures with few actinobacter species, rare Candida albicans. -Discussed with general surgery about wound culture results and they DO NOT feel an active infection at this time and they DO NOT feel anything further to do in addition to current treatment for C. difficile colitis.   -Monitor leukocytosis and if worsening may need to add fluconazole -Continue current pain management. -Saline lock IV fluids.   -General  surgery following peripherally.  2.  C. difficile  enteritis -Patient with multiple loose stools, 8 overnight and 4 this morning,, consistency slowly improving.  -Patient describing stool with some mucus noted in. -Afebrile.   -Continue oral vancomycin, probiotic.   -Creon added per oncology due to concern for possible of short-bowel with respect to diarrhea as patient had told oncologist has a lot of stool that floats concerning for fatty content.  -Patient complains of abdominal pain since starting Creon and requesting Creon be discontinued. -DC Creon. -PPI has been discontinued and patient started on Pepcid. -A dose of Dificid was given the morning of 01/23/2021, however patient wants to continue oral vancomycin for now as she feels number of stools slowly decreasing. -Continue oral vancomycin to complete a 14-day course on 01/30/19/2022. -ID consulted.  3.  Metastatic appendiceal cancer, pseudomyxoma peritonei and peritoneal carcinomatosis status post CRS/HI PEC and splenectomy in 2005 and recently CRS/HI PEC on 12/10/2020 for recurrent cancer -Per oncology.  4.  History of DVT and PE status post IVC filter -Continue home regimen prophylactic Lovenox.   5.  Hypertension -Continue current regimen of propranolol, Norvasc.   6.  Essential tremor Propranolol.   7.  Anxiety -Continue BuSpar.   8.  Type 2 diabetes -Hemoglobin A1c 5.8 (3/26) -Diet controlled.  9.  Hypokalemia -Secondary to GI losses.   -Potassium at 3.5  -K. Dur 40 mEq p.o. x1  -Follow.  10.  Autoimmune hepatitis -Continue to hold azathioprine and could likely resume once antibiotic course has been completed with clinical improvement.    DVT prophylaxis: Lovenox Code Status: Full Family Communication: Updated patient.  No family at bedside. Disposition:   Status is: Inpatient    Dispo: The patient is from: Home              Anticipated d/c is to: Likely home              Patient currently being treated for C. difficile colitis, ongoing multiple loose  stools.  Not stable for discharge.   Difficult to place patient no       Consultants:   Oncology: Dr. [name]  General surgery: Dr. Ninfa Linden 01/16/2021  Procedures:   CT abdomen and pelvis 01/16/2021  Antimicrobials:   IV cefepime 01/16/2021>>> 01/17/2021  IV Flagyl 01/16/2021>>>> 01/17/2021  IV vancomycin 01/16/2021>>>> 01/17/2021  Oral vancomycin 01/16/2021>>>>> 01/26/2021  Oral Dificid x1 dose 01/23/2021   Subjective: Patient laying in bed.  Stated had about 8 stools yesterday.  Patient stated has about 3-4 loose stools this morning with some chunks in it.  Patient very tearful.  Patient feeling discouraged.  No chest pain.  No shortness of breath.  Patient with some complaints of abdominal pain that she feels is associated with Creon and is requesting this be discontinued.     Objective: Vitals:   01/24/21 0641 01/24/21 1352 01/24/21 2346 01/25/21 0605  BP: (!) 152/86 (!) 147/91 (!) 142/79 (!) 146/82  Pulse: 82 84 81 77  Resp: 15 17 16 16   Temp: 98 F (36.7 C) 98.2 F (36.8 C) 99.1 F (37.3 C) 99 F (37.2 C)  TempSrc: Oral Oral Oral Oral  SpO2: 92% 93% 91% 94%  Weight:      Height:       No intake or output data in the 24 hours ending 01/25/21 1106 Filed Weights   01/16/21 1700  Weight: 102.1 kg    Examination:  General exam: NAD Respiratory system: CTA B.  No wheezes, no crackles,  no rhonchi.  Normal respiratory effort. Cardiovascular system: Regular rate rhythm no murmurs rubs or gallops.  No JVD.  No lower extremity edema. Gastrointestinal system: Abdomen is soft, nontender distended, decreased tenderness to palpation lower abdominal region.  Positive bowel sounds.  No rebound.  No guarding.   Central nervous system: Alert and oriented.  No focal neurological deficits. Extremities: Symmetric 5 x 5 power. Skin: No rashes, lesions or ulcers Psychiatry: Judgement and insight appear normal. Mood & affect appropriate.     Data Reviewed: I have personally  reviewed following labs and imaging studies  CBC: Recent Labs  Lab 01/19/21 0523 01/20/21 0610 01/21/21 0530 01/23/21 0537 01/25/21 0542  WBC 7.9 8.9 9.3 9.5 9.3  NEUTROABS 3.7 4.4 4.3 4.3 3.5  HGB 9.5* 9.6* 9.6* 10.3* 9.8*  HCT 31.2* 30.8* 31.3* 33.5* 31.8*  MCV 102.3* 99.4 99.7 99.4 99.7  PLT 250 204 261 310 449    Basic Metabolic Panel: Recent Labs  Lab 01/19/21 0523 01/20/21 0610 01/21/21 0530 01/22/21 0502 01/23/21 0537 01/24/21 0524 01/25/21 0542  NA 138 141 138 138 140 138 139  K 3.4* 3.9 3.7 3.9 4.1 4.0 3.5  CL 108 108 104 105 105 103 105  CO2 24 26 25 27 26 26 28   GLUCOSE 128* 128* 105* 118* 113* 113* 112*  BUN 8 10 9 10 11 12 11   CREATININE 0.50 0.55 0.40* 0.51 0.49 0.57 0.41*  CALCIUM 8.1* 8.5* 8.1* 8.2* 8.5* 8.6* 8.4*  MG 1.7 1.8 1.8 2.0 1.9 1.8 1.8  PHOS 2.3* 2.9  --   --  3.2  --  4.0    GFR: Estimated Creatinine Clearance: 92.4 mL/min (A) (by C-G formula based on SCr of 0.41 mg/dL (L)).  Liver Function Tests: Recent Labs  Lab 01/19/21 0523 01/20/21 0610 01/23/21 0537 01/25/21 0542  AST 24 26  --   --   ALT 12 12  --   --   ALKPHOS 145* 163*  --   --   BILITOT 0.4 0.5  --   --   PROT 5.7* 6.0*  --   --   ALBUMIN 2.0* 2.2* 2.2* 2.3*    CBG: No results for input(s): GLUCAP in the last 168 hours.   Recent Results (from the past 240 hour(s))  Blood culture (routine x 2)     Status: None   Collection Time: 01/16/21 11:14 AM   Specimen: BLOOD RIGHT HAND  Result Value Ref Range Status   Specimen Description   Final    BLOOD RIGHT HAND Performed at [facility], [place] [address] [place], [place] [address]    Special Requests   Final    BOTTLES DRAWN AEROBIC AND ANAEROBIC Blood Culture results may not be optimal due to an inadequate volume of blood received in culture bottles Performed at [place] [address] [place], [place] [address]    Culture   Final    NO GROWTH 5 DAYS Performed at [facility], [place] [address], [place], [place] [address]    Report Status 01/21/2021 FINAL  Final  Gastrointestinal Panel by PCR , Stool     Status: None   Collection Time: 01/16/21 11:28 AM   Specimen: Stool  Result Value Ref Range Status   Campylobacter species NOT DETECTED NOT DETECTED Final   Plesimonas shigelloides NOT DETECTED NOT DETECTED Final   Salmonella species NOT DETECTED NOT DETECTED Final   Yersinia enterocolitica NOT DETECTED NOT DETECTED Final  Vibrio species NOT DETECTED NOT DETECTED Final   Vibrio cholerae NOT DETECTED NOT DETECTED Final   Enteroaggregative E coli (EAEC) NOT DETECTED NOT DETECTED Final   Enteropathogenic E coli (EPEC) NOT DETECTED NOT DETECTED Final   Enterotoxigenic E coli (ETEC) NOT DETECTED NOT DETECTED Final   Shiga like toxin producing E coli (STEC) NOT DETECTED NOT DETECTED Final   Shigella/Enteroinvasive E coli (EIEC) NOT DETECTED NOT DETECTED Final   Cryptosporidium NOT DETECTED NOT DETECTED Final   Cyclospora cayetanensis NOT DETECTED NOT DETECTED Final   Entamoeba histolytica NOT DETECTED NOT DETECTED Final   Giardia lamblia NOT DETECTED NOT DETECTED Final   Adenovirus F40/41 NOT DETECTED NOT DETECTED Final   Astrovirus NOT DETECTED NOT DETECTED Final   Norovirus GI/GII NOT DETECTED NOT DETECTED Final   Rotavirus A NOT DETECTED NOT DETECTED Final   Sapovirus (I, II, IV, and V) NOT DETECTED NOT DETECTED Final    Comment: Performed at [facility], [place] [place], [place] [address]  C Difficile Quick Screen w PCR reflex     Status: Abnormal   Collection Time: 01/16/21 11:28 AM   Specimen: Stool  Result Value Ref Range Status   C Diff antigen POSITIVE (A) NEGATIVE Final   C Diff toxin NEGATIVE NEGATIVE Final   C Diff interpretation Results are indeterminate. See PCR results.  Final    Comment: Performed at [facility], [place] [address] [place], [place] [address]  C. Diff by PCR, Reflexed     Status:  Abnormal   Collection Time: 01/16/21 11:28 AM  Result Value Ref Range Status   Toxigenic C. Difficile by PCR POSITIVE (A) NEGATIVE Final    Comment: Positive for toxigenic C. difficile with little to no toxin production. Only treat if clinical presentation suggests symptomatic illness. Performed at [facility], [place] [address] [place], [place] [address]   Blood culture (routine x 2)     Status: None   Collection Time: 01/16/21  2:19 PM   Specimen: BLOOD LEFT ARM  Result Value Ref Range Status   Specimen Description   Final    BLOOD LEFT ARM Performed at [place] [address], [place], [place] [address]    Special Requests   Final    BOTTLES DRAWN AEROBIC AND ANAEROBIC Blood Culture adequate volume Performed at [place] [address], [place], [place] [address]    Culture   Final    NO GROWTH 5 DAYS Performed at [facility], [place] [address], [place], [place] [address]    Report Status 01/21/2021 FINAL  Final  Aerobic Culture w Gram Stain (superficial specimen)     Status: None   Collection Time: 01/16/21  2:20 PM   Specimen: Abdomen; Wound  Result Value Ref Range Status   Specimen Description   Final    ABDOMEN Performed at [place] [address], [place], [place] [address]    Special Requests   Final    NONE Performed at [facility], [place] [address], [place], [place] [address]    Gram Stain   Final    FEW WBC PRESENT, PREDOMINANTLY PMN NO ORGANISMS SEEN Performed at [facility], [place] [address] [place],  [address]    Culture FEW ACINETOBACTER SPECIES RARE CANDIDA ALBICANS   Final   Report Status 01/20/2021 FINAL  Final   Organism ID, Bacteria ACINETOBACTER SPECIES  Final  Susceptibility   Acinetobacter species - MIC*    CEFTAZIDIME 4 SENSITIVE Sensitive     CIPROFLOXACIN <=0.25 SENSITIVE Sensitive     GENTAMICIN <=1 SENSITIVE Sensitive      IMIPENEM <=0.25 SENSITIVE Sensitive     PIP/TAZO >=128 RESISTANT Resistant     TRIMETH/SULFA <=20 SENSITIVE Sensitive     AMPICILLIN/SULBACTAM <=2 SENSITIVE Sensitive     * FEW ACINETOBACTER SPECIES  Resp Panel by RT-PCR (Flu A&B, Covid) Nasopharyngeal Swab     Status: None   Collection Time: 01/16/21  3:47 PM   Specimen: Nasopharyngeal Swab; Nasopharyngeal(NP) swabs in vial transport medium  Result Value Ref Range Status   SARS Coronavirus 2 by RT PCR NEGATIVE NEGATIVE Final    Comment: (NOTE) SARS-CoV-2 target nucleic acids are NOT DETECTED.  The SARS-CoV-2 RNA is generally detectable in upper respiratory specimens during the acute phase of infection. The lowest concentration of SARS-CoV-2 viral copies this assay can detect is 138 copies/mL. A negative result does not preclude SARS-Cov-2 infection and should not be used as the sole basis for treatment or other patient management decisions. A negative result may occur with  improper specimen collection/handling, submission of specimen other than nasopharyngeal swab, presence of viral mutation(s) within the areas targeted by this assay, and inadequate number of viral copies(<138 copies/mL). A negative result must be combined with clinical observations, patient history, and epidemiological information. The expected result is Negative.  Fact Sheet for Patients:  EntrepreneurPulse.com.au  Fact Sheet for Healthcare Providers:  IncredibleEmployment.be  This test is no t yet approved or cleared by the [place] FDA and  has been authorized for detection and/or diagnosis of SARS-CoV-2 by FDA under an Emergency Use Authorization (EUA). This EUA will remain  in effect (meaning this test can be used) for the duration of the COVID-19 declaration under Section 564(b)(1) of the Act, 21 U.S.C.section 360bbb-3(b)(1), unless the authorization is terminated  or revoked sooner.       Influenza A by PCR  NEGATIVE NEGATIVE Final   Influenza B by PCR NEGATIVE NEGATIVE Final    Comment: (NOTE) The Xpert Xpress SARS-CoV-2/FLU/RSV plus assay is intended as an aid in the diagnosis of influenza from Nasopharyngeal swab specimens and should not be used as a sole basis for treatment. Nasal washings and aspirates are unacceptable for Xpert Xpress SARS-CoV-2/FLU/RSV testing.  Fact Sheet for Patients: EntrepreneurPulse.com.au  Fact Sheet for Healthcare Providers: IncredibleEmployment.be  This test is not yet approved or cleared by the [place] FDA and has been authorized for detection and/or diagnosis of SARS-CoV-2 by FDA under an Emergency Use Authorization (EUA). This EUA will remain in effect (meaning this test can be used) for the duration of the COVID-19 declaration under Section 564(b)(1) of the Act, 21 U.S.C. section 360bbb-3(b)(1), unless the authorization is terminated or revoked.  Performed at [facility], [place] [address], [place], [place] [address]          Radiology Studies: No results found.      Scheduled Meds: . amLODipine  10 mg Oral Daily  . busPIRone  5 mg Oral Q breakfast   And  . busPIRone  5 mg Oral Q1200   And  . busPIRone  10 mg Oral QHS  . enoxaparin  40 mg Subcutaneous Q24H  . famotidine  40 mg Oral Daily  . Gerhardt's butt cream   Topical QID  . multivitamin with minerals  1 tablet Oral Daily  . nutrition supplement (JUVEN)  1 packet Oral BID  WC  . propranolol  20 mg Oral BID  . saccharomyces boulardii  250 mg Oral BID  . sodium chloride flush  3 mL Intravenous Q12H  . vancomycin  125 mg Oral Q6H   Continuous Infusions: . magnesium sulfate bolus IVPB 2 g (01/25/21 1023)     LOS: 9 days    Time spent: 35 minutes    Irine Seal, MD [facility]   To contact the attending provider between 7A-7P or the covering provider during after hours 7P-7A, please log into the web  site [email or web address] and access using universal [place] password for that web site. If you do not have the password, please call the hospital operator.  01/25/2021, 11:06 AM

## 2021-01-25 NOTE — Progress Notes (Signed)
Nutrition Follow-up  DOCUMENTATION CODES:   Obesity unspecified  INTERVENTION:   -d/c Ensure MAX  -d/c Juven -pt refusing supplements  -Multivitamin with minerals daily  NUTRITION DIAGNOSIS:   Increased nutrient needs related to cancer and cancer related treatments as evidenced by estimated needs.  Ongoing.  GOAL:   Patient will meet greater than or equal to 90% of their needs  Progressing.  MONITOR:   PO intake,Supplement acceptance,Labs,Weight trends,I & O's,Skin  ASSESSMENT:   60 year old female with a past medical history of metastatic appendiceal cancer, pseudomyxoma peritonei and peritoneal carcinomatosis s/p CRS/HIPEC and splenectomy in 2005 and recently CRS/HIPEC on 12/10/2020 for recurrent cancer, autoimmune hepatitis, DVT and PE with IVC filter currently on Lovenox, hypertension, diabetes who presented to the ED with multiple complaints including intractable diarrhea since her procedure, fatigue and infection wound drainage this a.m.She was admitted for C diff colitis and concern for post op wound infection  Patient having some nausea. Diarrhea continues d/t c.diff.  Last documented intake from 3/31, 90% of dinner. Refusing protein and Juven supplements.   Admission weight: 225 lbs. No other weights this admission.  Medications: Multivitamin with minerals daily, KLOR-CON, Florastor, IV Mg sulfate, IV Zofran  Labs reviewed.  Diet Order:   Diet Order            Diet heart healthy/carb modified Room service appropriate? Yes; Fluid consistency: Thin  Diet effective now                 EDUCATION NEEDS:   No education needs have been identified at this time  Skin:  Skin Assessment: Skin Integrity Issues: Skin Integrity Issues:: Incisions Incisions: lower abdomen  Last BM:  3/27 -type 7  Height:   Ht Readings from Last 1 Encounters:  01/16/21 5\' 6"  (1.676 m)    Weight:   Wt Readings from Last 1 Encounters:  01/16/21 102.1 kg   BMI:  Body  mass index is 36.32 kg/m.  Estimated Nutritional Needs:   Kcal:  2000-2200  Protein:  100-115g  Fluid:  2L/day  Cl[place]ton Bibles, MS, RD, LDN Inpatient Clinical Dietitian Contact information available via Amion

## 2021-01-26 ENCOUNTER — Other Ambulatory Visit: Payer: TRICARE For Life (TFL)

## 2021-01-26 ENCOUNTER — Inpatient Hospital Stay (HOSPITAL_COMMUNITY): Payer: Medicare Other

## 2021-01-26 ENCOUNTER — Ambulatory Visit: Payer: TRICARE For Life (TFL) | Admitting: Hematology & Oncology

## 2021-01-26 DIAGNOSIS — L02211 Cutaneous abscess of abdominal wall: Secondary | ICD-10-CM | POA: Diagnosis not present

## 2021-01-26 DIAGNOSIS — K754 Autoimmune hepatitis: Secondary | ICD-10-CM | POA: Diagnosis not present

## 2021-01-26 DIAGNOSIS — T8149XA Infection following a procedure, other surgical site, initial encounter: Secondary | ICD-10-CM | POA: Diagnosis not present

## 2021-01-26 DIAGNOSIS — A0472 Enterocolitis due to Clostridium difficile, not specified as recurrent: Secondary | ICD-10-CM | POA: Diagnosis not present

## 2021-01-26 LAB — BASIC METABOLIC PANEL
Anion gap: 7 (ref 5–15)
BUN: 9 mg/dL (ref 6–20)
CO2: 28 mmol/L (ref 22–32)
Calcium: 8.6 mg/dL — ABNORMAL LOW (ref 8.9–10.3)
Chloride: 105 mmol/L (ref 98–111)
Creatinine, Ser: 0.45 mg/dL (ref 0.44–1.00)
GFR, Estimated: >60 mL/min (ref >60)
Glucose, Bld: 122 mg/dL — ABNORMAL HIGH (ref 70–99)
Potassium: 3.8 mmol/L (ref 3.5–5.1)
Sodium: 140 mmol/L (ref 135–145)

## 2021-01-26 LAB — MAGNESIUM: Magnesium: 2 mg/dL (ref 1.7–2.4)

## 2021-01-26 MED ORDER — DIPHENOXYLATE-ATROPINE 2.5-0.025 MG PO TABS
2.0000 | ORAL_TABLET | Freq: Four times a day (QID) | ORAL | Status: DC | PRN
  Administered 2021-01-26 – 2021-02-05 (×13): 2 via ORAL
  Filled 2021-01-26 (×15): qty 2

## 2021-01-26 MED ORDER — IOHEXOL 9 MG/ML PO SOLN: 500.0000 mL | ORAL | Status: AC

## 2021-01-26 MED ORDER — IOHEXOL 300 MG/ML  SOLN
100.0000 mL | Freq: Once | INTRAMUSCULAR | Status: AC | PRN
  Administered 2021-01-26: 100 mL via INTRAVENOUS

## 2021-01-26 MED ORDER — FENTANYL 25 MCG/HR TD PT72
1.0000 | MEDICATED_PATCH | TRANSDERMAL | Status: DC
  Administered 2021-01-26 – 2021-02-01 (×3): 1 via TRANSDERMAL
  Filled 2021-01-26 (×3): qty 1

## 2021-01-26 MED ORDER — IOHEXOL 9 MG/ML PO SOLN
ORAL | Status: AC
  Administered 2021-01-26: 500 mL via ORAL
  Filled 2021-01-26: qty 1000

## 2021-01-26 NOTE — Progress Notes (Signed)
Overall, [name] really about the same.  She is probably having more in the way of lower abdominal pain.  She is not on Creon.  She has had this because more abdominal pain.  I am not sure that really is because of Creon.  I really think she needs an abdominal CT scan to see with his abdominal wound is doing.  I will know if there is an abscess that is growing that needs to be drained.  I think there was Acinetobacter that was grown from the abdominal wound.  I do think that a Duragesic patch would not be a bad idea for her.  I suspect she is back and suffer from chronic abdominal pain.  I think a Duragesic patch would be manageable.  It can help provide some long-term pain control for her.  She is agreeable to this.  She is getting out of bed a little bit.  Her electrolytes today look okay.  Her BUN is 9 creatinine 0.45.  She has been afebrile.  Her blood pressure is 143/82.  Temperature is 98.  Pulse is 77.  Her abdominal exam is soft.  She is obese.  She has abdominal dressing on the lower abdomen.  She has some tenderness over the abdominal wound.  There is no obvious fluid wave.  There is no mass.  Lungs are clear.  Cardiac exam regular rate and rhythm.  It looks like that she may not have C. difficile.  ID has been following her.  We will have her see with the abdominal CT scan shows.  I think this would be very reasonable.  I appreciate a wonderful care that she is getting from all the staff on 6 E.  [name], MD  Darlyn Chamber 33:3

## 2021-01-26 NOTE — Progress Notes (Signed)
PROGRESS NOTE    Wendy Barber  WGY:[id number] DOB: 1961/08/18 DOA: 01/16/2021 PCP: Glendon Axe, MD    Chief Complaint  Patient presents with  . Diarrhea  . Wound Check    Brief Narrative:  This is a 60 year old female with a past medical history of metastatic appendiceal cancer, pseudomyxoma peritonei and peritoneal carcinomatosiss/p CRS/HIPECand splenectomy in 2005 and recently CRS/HIPEC on 12/10/2020 for recurrent cancer,autoimmune hepatitis,DVT and PE with IVC filtercurrently on Lovenox, hypertension, diabetes who presented to the ED with multiple complaints including intractable diarrhea since her procedure, fatigue and wound drainage this a.m.Since her hospitalization in February she has had persistent diarrhea but tested negative for C. difficile and was started on Imodium and Lomotil with some improvement. She was recently seen by her oncologist in [place] on 3/16 and patient states that she started antibiotics at that time but is unsure what medication. Says that her diarrhea has since worsened. Yesterday she started feeling very fatigued with decreased appetite and increasing pain and this morning she went to the bathroom and had an area of her incision openwith a significant amount of purulent foul-smelling drainage.She denies any fever, chills, chest pain. Has chronic nausea which she takes Zofran multiple times daily for. Of note, patient reports that she is up-to-date with her vaccines in regards to her splenectomy.  Regarding her recent surgery: She iss/pcytoreductive surgery/HIPEC 12/10/2020 at [facility] in [facility] with major reduction of small bowel, proximal jejunum, ascending colon, partial cystectomy. Postoperative course was notable for anastomotic leak and she was taken back to the OR on POD #10 and closure of abdominal wound on POD #13. She had a JP drain which was recently removed on 3/16.  She was admitted for C diff colitis  and concern for post op wound infection.     Assessment & Plan:   Principal Problem:   Surgical wound infection Active Problems:   Autoimmune hepatitis ([place])   DM2 (diabetes mellitus, type 2) ([place])   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)   DVT of deep femoral vein, left (HCC)   Pulmonary embolism, bilateral (HCC)   Presence of IVC filter   C. difficile colitis   Abscess of abdominal wall   Hypokalemia  1 abdominal postsurgical wound infection -Patient presented with concerns for abdominal postsurgical wound infection. -Patient afebrile on room air, no leukocytosis. -Status post cytoreductive surgery/HI PEC 12/10/2020 at [facility] in [place], [place] with major reduction of small bowel, proximal jejunum, ascending colon, partial cystectomy. -Postop course for anastomotic leak and taken back to the OR on POD #10 with closure of abdominal wound on POD #13. -Status post JP drain recently removed 01/06/2021. -CT abdomen and pelvis done concerning for anterior abdominal wall ventral surgical wound collection three, 3.2 x 1.8 x 13 cm concerning for infection as well as additional loculated fluid collection in the pelvis superior and anterior to the urinary bladder measuring 5.5 x 2.6 x 3.8 cm. -Asplenic. -General surgery following and suspected superficial wound seroma in abdominal wall, recommend wound packing daily, suspect fluid collection is sterile fluid collection is sterile. -Wound cultures with few actinobacter species, rare Candida albicans. -Discussed with general surgery about wound culture results and they DO NOT feel an active infection at this time and they DO NOT feel anything further to do in addition to current treatment for C. difficile colitis.   -Monitor leukocytosis and if worsening may need to add fluconazole -Continue current pain management. -Saline lock IV fluids.   -CT  abdomen and pelvis ordered per hematology/oncology this morning  for further evaluation of patient's abdominal pain. -General surgery following peripherally.  2.  C. difficile enteritis -Patient with multiple loose stools, which seems to be slowing down after Lomotil given last night.   -Patient drank contrast with CT abdomen and pelvis and stated started to have loose stool again.  -Patient describing stool with some mucus noted in. -Afebrile.   -Continue oral vancomycin, probiotic.   -Creon added per oncology due to concern for possible of short-bowel with respect to diarrhea as patient had told oncologist has a lot of stool that floats concerning for fatty content.  -Patient complained of abdominal pain since starting Creon and requested Creon to be discontinued which it has.   -Patient stated some improvement with abdominal pain after Creon discontinued.  -PPI discontinued patient started on Pepcid.  -A dose of Dificid was given the morning of 01/23/2021, however patient wants to continue oral vancomycin for now as she feels number of stools slowly decreasing. -CT abdomen and pelvis ordered per Oncology for further evaluation of patient's abdominal pain. -Continue oral vancomycin to complete a 14-day course on 01/30/19/2022. -ID was consulted, see recommendations.  3.  Metastatic appendiceal cancer, pseudomyxoma peritonei and peritoneal carcinomatosis status post CRS/HI PEC and splenectomy in 2005 and recently CRS/HI PEC on 12/10/2020 for recurrent cancer -Per oncology.  4.  History of DVT and PE status post IVC filter -Continue home regimen prophylactic Lovenox.   5.  Hypertension -Continue Norvasc, propranolol.    6.  Essential tremor Continue propranolol.    7.  Anxiety -BuSpar.  8.  Type 2 diabetes -Hemoglobin A1c 5.8 (3/26) -Diet controlled.  9.  Hypokalemia -Secondary to GI losses.   -Potassium at 3.8.   -Magnesium at 2.0  -Follow.   10.  Autoimmune hepatitis -Continue to hold azathioprine and could likely resume once antibiotic  course has been completed with clinical improvement.    DVT prophylaxis: Lovenox Code Status: Full Family Communication: Updated patient.  Updated daughter at bedside.  Disposition:   Status is: Inpatient    Dispo: The patient is from: Home              Anticipated d/c is to: Likely home              Patient currently being treated for C. difficile colitis, ongoing multiple loose stools.  CT abdomen and pelvis pending not stable for discharge.   Difficult to place patient no       Consultants:   Oncology: Dr. [name]  General surgery: Dr. Ninfa Linden 01/16/2021  Procedures:   CT abdomen and pelvis 01/16/2021  CT abdomen and pelvis 01/26/2021 pending  Antimicrobials:   IV cefepime 01/16/2021>>> 01/17/2021  IV Flagyl 01/16/2021>>>> 01/17/2021  IV vancomycin 01/16/2021>>>> 01/17/2021  Oral vancomycin 01/16/2021>>>>> 4/8/ 2022  Oral Dificid x1 dose 01/23/2021   Subjective: Patient laying in bed.  Stated had multiple stools yesterday which subsided after receiving Lomotil.  No bowel movement this morning.  No chest pain.  States abdominal pain improved after Creon was discontinued.   Stated had some loose stools after drinking contrast in preparation for CT abdomen and pelvis which was ordered this morning.      Objective: Vitals:   01/25/21 0605 01/25/21 1615 01/25/21 2133 01/26/21 0645  BP: (!) 146/82 (!) 187/97 (!) 148/86 (!) 143/82  Pulse: 77 83 88 77  Resp: 16 18 17 17   Temp: 99 F (37.2 C)  98.7 F (37.1 C) 98 F (  36.7 C)  TempSrc: Oral  Oral Oral  SpO2: 94% 93% 94% 96%  Weight:      Height:        Intake/Output Summary (Last 24 hours) at 01/26/2021 1319 Last data filed at 01/25/2021 1400 Gross per 24 hour  Intake 240 ml  Output --  Net 240 ml   Filed Weights   01/16/21 1700  Weight: 102.1 kg    Examination:  General exam: NAD Respiratory system: Lungs clear to quotation bilaterally.  No wheezes, no crackles, no rhonchi.  Normal respiratory effort.   Cardiovascular system: RRR no murmurs rubs or gallops.  No JVD.  No lower extremity edema. Gastrointestinal system: Abdomen is obese, soft, nondistended, decreased tenderness to palpation lower abdominal region.  Positive bowel sounds.  No rebound.  No guarding.  Central nervous system: Alert and oriented.  Moving extremities spontaneously.  No focal neurological deficits.   Extremities: Symmetric 5 x 5 power. Skin: No rashes, lesions or ulcers Psychiatry: Judgement and insight appear normal. Mood & affect appropriate.     Data Reviewed: I have personally reviewed following labs and imaging studies  CBC: Recent Labs  Lab 01/20/21 0610 01/21/21 0530 01/23/21 0537 01/25/21 0542  WBC 8.9 9.3 9.5 9.3  NEUTROABS 4.4 4.3 4.3 3.5  HGB 9.6* 9.6* 10.3* 9.8*  HCT 30.8* 31.3* 33.5* 31.8*  MCV 99.4 99.7 99.4 99.7  PLT 204 261 310 993    Basic Metabolic Panel: Recent Labs  Lab 01/20/21 0610 01/21/21 0530 01/22/21 0502 01/23/21 0537 01/24/21 0524 01/25/21 0542 01/26/21 0557  NA 141   < > 138 140 138 139 140  K 3.9   < > 3.9 4.1 4.0 3.5 3.8  CL 108   < > 105 105 103 105 105  CO2 26   < > 27 26 26 28 28   GLUCOSE 128*   < > 118* 113* 113* 112* 122*  BUN 10   < > 10 11 12 11 9   CREATININE 0.55   < > 0.51 0.49 0.57 0.41* 0.45  CALCIUM 8.5*   < > 8.2* 8.5* 8.6* 8.4* 8.6*  MG 1.8   < > 2.0 1.9 1.8 1.8 2.0  PHOS 2.9  --   --  3.2  --  4.0  --    < > = values in this interval not displayed.    GFR: Estimated Creatinine Clearance: 92.4 mL/min (by C-G formula based on SCr of 0.45 mg/dL).  Liver Function Tests: Recent Labs  Lab 01/20/21 0610 01/23/21 0537 01/25/21 0542  AST 26  --   --   ALT 12  --   --   ALKPHOS 163*  --   --   BILITOT 0.5  --   --   PROT 6.0*  --   --   ALBUMIN 2.2* 2.2* 2.3*    CBG: No results for input(s): GLUCAP in the last 168 hours.   Recent Results (from the past 240 hour(s))  Blood culture (routine x 2)     Status: None   Collection Time:  01/16/21  2:19 PM   Specimen: BLOOD LEFT ARM  Result Value Ref Range Status   Specimen Description   Final    BLOOD LEFT ARM Performed at [facility], [place] [address] [place], [place] [address]    Special Requests   Final    BOTTLES DRAWN AEROBIC AND ANAEROBIC Blood Culture adequate volume Performed at [place] [address] [place], [place] [address]  Culture   Final    NO GROWTH 5 DAYS Performed at [facility], [place] [address] [place], [place] [address]    Report Status 01/21/2021 FINAL  Final  Aerobic Culture w Gram Stain (superficial specimen)     Status: None   Collection Time: 01/16/21  2:20 PM   Specimen: Abdomen; Wound  Result Value Ref Range Status   Specimen Description   Final    ABDOMEN Performed at [place] [address] [place], [place] [address]    Special Requests   Final    NONE Performed at [facility], [place] [address] [place], [place] [address]    Gram Stain   Final    FEW WBC PRESENT, PREDOMINANTLY PMN NO ORGANISMS SEEN Performed at [facility], [place] [address], [place], [place] [address]    Culture FEW ACINETOBACTER SPECIES RARE CANDIDA ALBICANS   Final   Report Status 01/20/2021 FINAL  Final   Organism ID, Bacteria ACINETOBACTER SPECIES  Final      Susceptibility   Acinetobacter species - MIC*    CEFTAZIDIME 4 SENSITIVE Sensitive     CIPROFLOXACIN <=0.25 SENSITIVE Sensitive     GENTAMICIN <=1 SENSITIVE Sensitive     IMIPENEM <=0.25 SENSITIVE Sensitive     PIP/TAZO >=128 RESISTANT Resistant     TRIMETH/SULFA <=20 SENSITIVE Sensitive     AMPICILLIN/SULBACTAM <=2 SENSITIVE Sensitive     * FEW ACINETOBACTER SPECIES  Resp Panel by RT-PCR (Flu A&B, Covid) Nasopharyngeal Swab     Status: None   Collection Time: 01/16/21  3:47 PM   Specimen: Nasopharyngeal Swab; Nasopharyngeal(NP) swabs in vial transport medium  Result Value Ref Range Status    SARS Coronavirus 2 by RT PCR NEGATIVE NEGATIVE Final    Comment: (NOTE) SARS-CoV-2 target nucleic acids are NOT DETECTED.  The SARS-CoV-2 RNA is generally detectable in upper respiratory specimens during the acute phase of infection. The lowest concentration of SARS-CoV-2 viral copies this assay can detect is 138 copies/mL. A negative result does not preclude SARS-Cov-2 infection and should not be used as the sole basis for treatment or other patient management decisions. A negative result may occur with  improper specimen collection/handling, submission of specimen other than nasopharyngeal swab, presence of viral mutation(s) within the areas targeted by this assay, and inadequate number of viral copies(<138 copies/mL). A negative result must be combined with clinical observations, patient history, and epidemiological information. The expected result is Negative.  Fact Sheet for Patients:  EntrepreneurPulse.com.au  Fact Sheet for Healthcare Providers:  IncredibleEmployment.be  This test is no t yet approved or cleared by the [place] FDA and  has been authorized for detection and/or diagnosis of SARS-CoV-2 by FDA under an Emergency Use Authorization (EUA). This EUA will remain  in effect (meaning this test can be used) for the duration of the COVID-19 declaration under Section 564(b)(1) of the Act, 21 U.S.C.section 360bbb-3(b)(1), unless the authorization is terminated  or revoked sooner.       Influenza A by PCR NEGATIVE NEGATIVE Final   Influenza B by PCR NEGATIVE NEGATIVE Final    Comment: (NOTE) The Xpert Xpress SARS-CoV-2/FLU/RSV plus assay is intended as an aid in the diagnosis of influenza from Nasopharyngeal swab specimens and should not be used as a sole basis for treatment. Nasal washings and aspirates are unacceptable for Xpert Xpress SARS-CoV-2/FLU/RSV testing.  Fact Sheet for  Patients: EntrepreneurPulse.com.au  Fact Sheet for Healthcare Providers: IncredibleEmployment.be  This test is  not yet approved or cleared by the [place] and has been authorized for detection and/or diagnosis of SARS-CoV-2 by FDA under an Emergency Use Authorization (EUA). This EUA will remain in effect (meaning this test can be used) for the duration of the COVID-19 declaration under Section 564(b)(1) of the Act, 21 U.S.C. section 360bbb-3(b)(1), unless the authorization is terminated or revoked.  Performed at [facility], [place] [address] [place], [place] [address]          Radiology Studies: No results found.      Scheduled Meds: . amLODipine  10 mg Oral Daily  . busPIRone  5 mg Oral Q breakfast   And  . busPIRone  5 mg Oral Q1200   And  . busPIRone  10 mg Oral QHS  . enoxaparin  40 mg Subcutaneous Q24H  . famotidine  40 mg Oral Daily  . fentaNYL  1 patch Transdermal Q72H  . Gerhardt's butt cream   Topical QID  . multivitamin with minerals  1 tablet Oral Daily  . polycarbophil  625 mg Oral Daily  . propranolol  20 mg Oral BID  . saccharomyces boulardii  250 mg Oral BID  . sodium chloride flush  3 mL Intravenous Q12H  . vancomycin  125 mg Oral Q6H   Continuous Infusions:    LOS: 10 days    Time spent: 35 minutes    Irine Seal, MD [facility]   To contact the attending provider between 7A-7P or the covering provider during after hours 7P-7A, please log into the web site [email or web address] and access using universal [place] password for that web site. If you do not have the password, please call the hospital operator.  01/26/2021, 1:19 PM

## 2021-01-27 ENCOUNTER — Encounter (HOSPITAL_COMMUNITY): Payer: Self-pay | Admitting: Internal Medicine

## 2021-01-27 ENCOUNTER — Inpatient Hospital Stay (HOSPITAL_COMMUNITY): Payer: Medicare Other

## 2021-01-27 DIAGNOSIS — A0472 Enterocolitis due to Clostridium difficile, not specified as recurrent: Secondary | ICD-10-CM | POA: Diagnosis not present

## 2021-01-27 DIAGNOSIS — K754 Autoimmune hepatitis: Secondary | ICD-10-CM | POA: Diagnosis not present

## 2021-01-27 DIAGNOSIS — T8149XA Infection following a procedure, other surgical site, initial encounter: Secondary | ICD-10-CM | POA: Diagnosis not present

## 2021-01-27 DIAGNOSIS — L02211 Cutaneous abscess of abdominal wall: Secondary | ICD-10-CM | POA: Diagnosis not present

## 2021-01-27 LAB — COMPREHENSIVE METABOLIC PANEL
ALT: 14 U/L (ref 0–44)
AST: 35 U/L (ref 15–41)
Albumin: 2.2 g/dL — ABNORMAL LOW (ref 3.5–5.0)
Alkaline Phosphatase: 263 U/L — ABNORMAL HIGH (ref 38–126)
Anion gap: 9 (ref 5–15)
BUN: 9 mg/dL (ref 6–20)
CO2: 26 mmol/L (ref 22–32)
Calcium: 8.5 mg/dL — ABNORMAL LOW (ref 8.9–10.3)
Chloride: 105 mmol/L (ref 98–111)
Creatinine, Ser: 0.57 mg/dL (ref 0.44–1.00)
GFR, Estimated: >60 mL/min (ref >60)
Glucose, Bld: 112 mg/dL — ABNORMAL HIGH (ref 70–99)
Potassium: 3.9 mmol/L (ref 3.5–5.1)
Sodium: 140 mmol/L (ref 135–145)
Total Bilirubin: 0.6 mg/dL (ref 0.3–1.2)
Total Protein: 6.4 g/dL — ABNORMAL LOW (ref 6.5–8.1)

## 2021-01-27 LAB — CBC WITH DIFFERENTIAL/PLATELET
Abs Immature Granulocytes: 0.03 10*3/uL (ref 0.00–0.07)
Basophils Absolute: 0.1 10*3/uL (ref 0.0–0.1)
Basophils Relative: 1 %
Eosinophils Absolute: 0.3 10*3/uL (ref 0.0–0.5)
Eosinophils Relative: 3 %
HCT: 31.9 % — ABNORMAL LOW (ref 36.0–46.0)
Hemoglobin: 10 g/dL — ABNORMAL LOW (ref 12.0–15.0)
Immature Granulocytes: 0 %
Lymphocytes Relative: 47 %
Lymphs Abs: 4.1 10*3/uL — ABNORMAL HIGH (ref 0.7–4.0)
MCH: 31 pg (ref 26.0–34.0)
MCHC: 31.3 g/dL (ref 30.0–36.0)
MCV: 98.8 fL (ref 80.0–100.0)
Monocytes Absolute: 1 10*3/uL (ref 0.1–1.0)
Monocytes Relative: 11 %
Neutro Abs: 3.4 10*3/uL (ref 1.7–7.7)
Neutrophils Relative %: 38 %
Platelets: 392 10*3/uL (ref 150–400)
RBC: 3.23 MIL/uL — ABNORMAL LOW (ref 3.87–5.11)
RDW: 19.6 % — ABNORMAL HIGH (ref 11.5–15.5)
WBC: 8.8 10*3/uL (ref 4.0–10.5)
nRBC: 0 % (ref 0.0–0.2)

## 2021-01-27 MED ORDER — FENTANYL CITRATE (PF) 100 MCG/2ML IJ SOLN
INTRAMUSCULAR | Status: AC | PRN
  Administered 2021-01-27 (×2): 50 ug via INTRAVENOUS

## 2021-01-27 MED ORDER — LIDOCAINE-EPINEPHRINE 1 %-1:100000 IJ SOLN
INTRAMUSCULAR | Status: AC | PRN
  Administered 2021-01-27: 10 mL

## 2021-01-27 MED ORDER — FENTANYL CITRATE (PF) 100 MCG/2ML IJ SOLN
INTRAMUSCULAR | Status: AC
  Filled 2021-01-27: qty 2

## 2021-01-27 MED ORDER — DIPHENHYDRAMINE HCL 50 MG/ML IJ SOLN
INTRAMUSCULAR | Status: AC
  Filled 2021-01-27: qty 1

## 2021-01-27 MED ORDER — DIPHENHYDRAMINE HCL 50 MG/ML IJ SOLN
INTRAMUSCULAR | Status: AC | PRN
  Administered 2021-01-27: 25 mg via INTRAVENOUS

## 2021-01-27 MED ORDER — MIDAZOLAM HCL 2 MG/2ML IJ SOLN
INTRAMUSCULAR | Status: AC | PRN
  Administered 2021-01-27 (×2): 1 mg via INTRAVENOUS

## 2021-01-27 MED ORDER — MIDAZOLAM HCL 2 MG/2ML IJ SOLN
INTRAMUSCULAR | Status: AC
  Filled 2021-01-27: qty 4

## 2021-01-27 NOTE — Progress Notes (Signed)
MEDICATION-RELATED CONSULT NOTE   IR Procedure Consult - Anticoagulant/Antiplatelet PTA/Inpatient Med List Review by Pharmacist    Procedure: CT guided drainage catheter placement into abdominal abscess    Completed: 01/27/21 at 15:45  Post-Procedural bleeding risk per IR MD assessment:  Standard  Antithrombotic medications on inpatient or PTA profile prior to procedure:       Recommended restart time per IR Post-Procedure Guidelines:  Day +1 in AM   Plan:    Continue with Lovenox 40mg  SQ q24h - next dose tomorrow at [place], PharmD, [place]: [phone] 01/27/2021 3:57 PM

## 2021-01-27 NOTE — Progress Notes (Signed)
Chief Complaint: Patient was seen in consultation today for abdominal wall abscess  Referring Physician(s): Dr. [name]  Supervising Physician: Aletta Edouard  Patient Status: [facility] - In-pt  History of Present Illness: Wendy Barber is a 60 y.o. female with a past medical history of metastatic appendiceal cancer, pseudomyxoma peritonei and peritoneal carcinomatosiss/p CRS/HIPECand splenectomy in 2005 and recently CRS/HIPEC on 12/10/2020 for recurrent cancer,autoimmune hepatitis,DVT and PE with IVC filtercurrently on Lovenox, hypertension, diabetes who presented to the ED with multiple complaints including intractable diarrhea since her procedure, fatigue and wound drainage. She is C/diff + currently on Vancomycin regimen. She was seen by general surgery who was following the wound, which has subsequently sealed. However she's had ongoing abd pain so a repeat CT scan was done yesterday showing enlargement of anterior abdominal wall abscess. There is also concern for fistulous communication to the bowel. General surgery is to be reconsulted but IR is asked to place perc drain. PMHx, meds, labs, imaging, allergies reviewed. Ate some breakfast this am about 8-830.    Past Medical History:  Diagnosis Date  . Arthritis   . Back pain   . Cancer [facility])    pseudomyxoma peritonei  . 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[place]) 03/19/2020  . Chronic fatigue syndrome   . Diabetes mellitus without complication ([place])   . DVT of deep femoral vein, left ([place]) 03/19/2020  . Fibromyalgia   . Goals of care, counseling/discussion 03/19/2020  . Hypertension   . Malignant pseudomyxoma peritonei ([place]) 03/19/2020  . Presence of IVC filter 03/19/2020  . Pulmonary embolism, bilateral ([place]) 03/19/2020    Past Surgical History:  Procedure Laterality Date  . ABDOMINAL HYSTERECTOMY    . ABDOMINAL SURGERY    . ACHILLES TENDON REPAIR    . APPENDECTOMY    . ARTHROPLASTY    .  CARPAL TUNNEL RELEASE    . CHOLECYSTECTOMY    . IR US GUIDE BX ASP/DRAIN  11/25/2019  . JOINT REPLACEMENT    . KNEE ARTHROSCOPY    . ORIF ANKLE FRACTURE Right 08/27/2019   Procedure: OPEN REDUCTION INTERNAL FIXATION RIGHT ANKLE FRACTURE;  Surgeon: Renette Butters, MD;  Location: [facility] ORS;  Service: Orthopedics;  Laterality: Right;  . perineorrophy    . TONGUE BIOPSY      Allergies: Penicillins, Alprazolam, Ativan [lorazepam], Corticosteroids, Erythromycin, Erythromycin base, Milnacipran hcl, Prednisone, Savella  [milnacipran], Povidone iodine, and Prednisolone  Medications:  Current Facility-Administered Medications:  .  acetaminophen (TYLENOL) tablet 650 mg, 650 mg, Oral, Q4H PRN, Eugenie Filler, MD, 650 mg at 01/27/21 0119 .  amLODipine (NORVASC) tablet 10 mg, 10 mg, Oral, Daily, Eugenie Filler, MD, 10 mg at 01/27/21 0934 .  busPIRone (BUSPAR) tablet 5 mg, 5 mg, Oral, Q breakfast, 5 mg at 01/27/21 0737 **AND** busPIRone (BUSPAR) tablet 5 mg, 5 mg, Oral, Q1200, 5 mg at 01/26/21 1453 **AND** busPIRone (BUSPAR) tablet 10 mg, 10 mg, Oral, QHS, Harold Hedge, MD, 10 mg at 01/26/21 2100 .  calcium carbonate (TUMS - dosed in mg elemental calcium) chewable tablet 200 mg of elemental calcium, 1 tablet, Oral, TID PRN, Eugenie Filler, MD .  diclofenac Sodium (VOLTAREN) 1 % topical gel 2 g, 2 g, Topical, QID PRN, Elodia Florence., MD .  diphenoxylate-atropine (LOMOTIL) 2.5-0.025 MG per tablet 2 tablet, 2 tablet, Oral, QID PRN, Eugenie Filler, MD, 2 tablet at 01/26/21 2112 .  enoxaparin (LOVENOX) injection 40 mg, 40 mg, Subcutaneous, Q24H, Harold Hedge, MD,  40 mg at 01/27/21 0934 .  famotidine (PEPCID) tablet 40 mg, 40 mg, Oral, Daily, Eugenie Filler, MD, 40 mg at 01/27/21 0934 .  fentaNYL (DURAGESIC) 25 MCG/HR 1 patch, 1 patch, Transdermal, Q72H, Volanda Napoleon, MD, 1 patch at 01/26/21 1004 .  Gerhardt's butt cream, , Topical, QID, Eugenie Filler, MD, Given at 01/27/21  [phone] .  hydrALAZINE (APRESOLINE) injection 10 mg, 10 mg, Intravenous, Q8H PRN, Harold Hedge, MD, 10 mg at 01/18/21 1446 .  HYDROmorphone (DILAUDID) injection 1 mg, 1 mg, Intravenous, Q3H PRN, Elodia Florence., MD, 1 mg at 01/27/21 0935 .  HYDROmorphone (DILAUDID) tablet 2 mg, 2 mg, Oral, Q4H PRN, Elodia Florence., MD, 2 mg at 01/27/21 0720 .  multivitamin with minerals tablet 1 tablet, 1 tablet, Oral, Daily, Elodia Florence., MD, 1 tablet at 01/27/21 [phone] .  ondansetron (ZOFRAN) tablet 4 mg, 4 mg, Oral, Q6H PRN, 4 mg at 01/26/21 0857 **OR** ondansetron (ZOFRAN) injection 4 mg, 4 mg, Intravenous, Q6H PRN, Harold Hedge, MD, 4 mg at 01/26/21 1643 .  polycarbophil (FIBERCON) tablet 625 mg, 625 mg, Oral, Daily, Eugenie Filler, MD, 625 mg at 01/27/21 0934 .  propranolol (INDERAL) tablet 20 mg, 20 mg, Oral, BID, Elodia Florence., MD, 20 mg at 01/27/21 0934 .  saccharomyces boulardii (FLORASTOR) capsule 250 mg, 250 mg, Oral, BID, Volanda Napoleon, MD, 250 mg at 01/27/21 0934 .  simethicone (MYLICON) chewable tablet 80 mg, 80 mg, Oral, QID PRN, Opyd, Ilene Qua, MD, 80 mg at 01/26/21 0505 .  sodium chloride flush (NS) 0.9 % injection 3 mL, 3 mL, Intravenous, Q12H, Harold Hedge, MD, 3 mL at 01/27/21 0935 .  vancomycin (VANCOCIN) 50 mg/mL oral solution 125 mg, 125 mg, Oral, Q6H, Vu, Trung T, MD, 125 mg at 01/27/21 0540    History reviewed. No pertinent family history.  Social History   Socioeconomic History  . Marital status: Married    Spouse name: Not on file  . Number of children: 3  . Years of education: Not on file  . Highest education level: Some college, no degree  Occupational History  . Not on file  Tobacco Use  . Smoking status: Never Smoker  . Smokeless tobacco: Never Used  Vaping Use  . Vaping Use: Not on file  Substance and Sexual Activity  . Alcohol use: No  . Drug use: Never  . Sexual activity: Not on file  Other Topics Concern  . Not on file   Social History Narrative   Right handed   One story home   Drinks occasional caffeine   Social Determinants of Health   Financial Resource Strain: Not on file  Food Insecurity: Not on file  Transportation Needs: Not on file  Physical Activity: Not on file  Stress: Not on file  Social Connections: Not on file    Review of Systems: A 12 point ROS discussed and pertinent positives are indicated in the HPI above.  All other systems are negative.  Review of Systems  Vital Signs: BP (!) 142/91 (BP Location: Right Arm)   Pulse 75   Temp 98 F (36.7 C) (Oral)   Resp 16   Ht 5\' 6"  (1.676 m)   Wt 102.1 kg   SpO2 91%   BMI 36.32 kg/m   Physical Exam Constitutional:      Appearance: Normal appearance.  HENT:     Mouth/Throat:     Mouth: Mucous membranes  are moist.     Pharynx: Oropharynx is clear.  Cardiovascular:     Rate and Rhythm: Normal rate and regular rhythm.     Heart sounds: Normal heart sounds.  Pulmonary:     Effort: Pulmonary effort is normal. No respiratory distress.     Breath sounds: Normal breath sounds.  Abdominal:     Palpations: Abdomen is soft.     Tenderness: There is abdominal tenderness.     Comments: Midline incision appears healed. Two small scabs, one superiorly and one more central, no erythema but tenderness near and around the lower scab(she reports this is where she was draining from before)  Neurological:     General: No focal deficit present.     Mental Status: She is alert and oriented to person, place, and time.  Psychiatric:        Mood and Affect: Mood normal.        Thought Content: Thought content normal.        Judgment: Judgment normal.       Imaging: CT ABDOMEN PELVIS W CONTRAST  Result Date: 01/26/2021 CLINICAL DATA:  Abdominal pain for the past few months. History of surgery in February. EXAM: CT ABDOMEN AND PELVIS WITH CONTRAST TECHNIQUE: Multidetector CT imaging of the abdomen and pelvis was performed using the standard  protocol following bolus administration of intravenous contrast. CONTRAST:  142mL OMNIPAQUE IOHEXOL 300 MG/ML  SOLN COMPARISON:  01/16/2021 FINDINGS: Lower chest: The lung bases are clear of an acute process. No pleural effusions. The heart is within normal limits in size. No pericardial effusion. Hepatobiliary: No hepatic lesions or intrahepatic biliary dilatation. The gallbladder is surgically absent. Stable mild associated common bile duct dilatation. Pancreas: No mass, inflammation or ductal dilatation. Spleen: Status post splenectomy. Adrenals/Urinary Tract: Adrenal glands and kidneys are unremarkable and stable. The bladder is grossly normal. Stomach/Bowel: The stomach, duodenum and small bowel are grossly normal. There are extensive surgical changes involving the colon and small bowel. Persistent inflammatory type changes involving the colon without obvious mass or obstruction. Slightly thickened small bowel loops in the anterior abdomen/pelvis area these bowel loops appear clustered together near some surgical changes and this could be an adhesive process. Is also a small fluid collection and a small amount of gas near the anterior abdominal wall which appears to communicate with a rim enhancing abscess in the anterior abdominal wall. Findings suspicious for a fistula. The anterior abdominal wall abscess is slightly larger when compared to the recent prior CT. It measures approximately 4.4 x 3.4 cm. Stable surgical changes involving the rectum. Persistent fluid collection in the pelvis just above the bladder anteriorly. This measures approximately 4.5 by 2.5 cm and previously measured 5.0 x 2.6 cm. I do not see any gas in this collection and it could be a liquified hematoma or postoperative seroma. Vascular/Lymphatic: The aorta and branch vessels are patent. 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 This may account for some of the colonic  inflammation which could be due to vascular congestion. The portal vein is patent. Reproductive: Surgically absent. Other: Mesenteric edema but no overt ascites. Musculoskeletal: No significant bony findings. IMPRESSION: 1. Persistent inflammatory type changes involving the colon and small bowel. 2. Slight interval increase in size of the anterior abdominal wall abscess which appears to communicate with a smaller intra-abdominal abscess. Findings worrisome for a fistula. 3. Persistent fluid collection in the pelvis just above the bladder anteriorly. This  could be a liquified hematoma or postoperative seroma. 4. 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 This may account for some of the colonic inflammation which could be due to vascular congestion. Electronically Signed   By: Marijo Sanes M.D.   On: 01/26/2021 18:35   CT ABDOMEN PELVIS W CONTRAST  Result Date: 01/16/2021 CLINICAL DATA:  Surgery 5 weeks ago. Nausea, vomiting, and diarrhea for 6 weeks, worsened this morning. Incisional drainage of yellow fluid starting this morning. Appendiceal cancer with pseudomyxoma peritonei, chemotherapy, pulmonary embolism post IVC filter placement, fibromyalgia EXAM: CT ABDOMEN AND PELVIS WITH CONTRAST TECHNIQUE: Multidetector CT imaging of the abdomen and pelvis was performed using the standard protocol following bolus administration of intravenous contrast. Sagittal and coronal MPR images reconstructed from axial data set. CONTRAST:  168mL OMNIPAQUE IOHEXOL 300 MG/ML SOLN IV. No oral contrast administered. COMPARISON:  10/28/2020 FINDINGS: Lower chest: Subsegmental atelectasis versus scarring lateral RIGHT lower lobe unchanged. Small posterior LEFT diaphragmatic hernia containing fat. Hepatobiliary: Nodular contour of the liver without discrete mass. Post cholecystectomy. Pancreas: Atrophic pancreas Spleen: Post splenectomy Adrenals/Urinary Tract: Unremarkable adrenal glands.  Kidneys, ureters, and bladder normal appearance Stomach/Bowel: Anastomotic staple line at rectum within additional anastomotic staple lines in the RIGHT pelvis and central mid abdomen. Diffuse colonic wall thickening. Stomach decompressed. Small bowel loops demonstrate mild scattered wall thickening as well. No evidence of obstruction. Vascular/Lymphatic: IVC filter, with penetration of the IVC wall by multiple limbs. Vascular structures patent. No adenopathy. Reproductive: Uterus surgically absent.  Ovaries nonvisualized. Other: Scattered infiltrative changes of mesenteric and abdominal fat which may be related to interval surgery, though cannot exclude involvement by peritoneal tumor. Gas and fluid collection identified within anterior abdominal wall ventral surgical wound measuring 3.2 x 1.8 cm in greatest axial dimensions and extending 13 cm in length, concerning for wound infection. Additional loculated fluid collection in the pelvis just superior and anterior to the urinary bladder measuring 5.5 x 2.6 x 3.8 cm, partially enhancing margins, sterile versus infected postoperative collection. Small amount of free air at anterior abdomen extending into the surgical wound. Musculoskeletal: Prior lumbosacral fusion. Scattered degenerative changes of thoracic and lumbar spine. IMPRESSION: Gas and fluid collection within the anterior abdominal wall ventral surgical wound measuring 3.2 x 1.8 x 13 cmconcerning for wound infection. Additional loculated fluid collection in the pelvis just superior and anterior to the urinary bladder measuring 5.5 x 2.6 x 3.8 cm, question sterile versus infected postoperative collection. Scattered infiltrative changes of mesenteric and abdominal fat which may be related to interval surgery, though cannot exclude involvement by peritoneal tumor. Small amount of free air extending into the surgical wound. Diffuse colonic and small bowel wall thickening which may be related to recent  abdominal/intestinal surgery though enteritis not excluded Nodular contour of the liver question cirrhosis. Small posterior LEFT diaphragmatic hernia containing fat. Aortic Atherosclerosis (ICD10-I70.0). Electronically Signed   By: Lavonia Dana M.D.   On: 01/16/2021 13:40    Labs:  CBC: Recent Labs    01/21/21 0530 01/23/21 0537 01/25/21 0542 01/27/21 0459  WBC 9.3 9.5 9.3 8.8  HGB 9.6* 10.3* 9.8* 10.0*  HCT 31.3* 33.5* 31.8* 31.9*  PLT 261 310 347 392    COAGS: No results for input(s): INR, APTT in the last 8760 hours.  BMP: Recent Labs    03/19/20 1109 05/01/20 1156 07/27/20 0922 10/28/20 0956 01/24/21 0524 01/25/21 0542 01/26/21 0557 01/27/21 0459  NA 143 141 143   < > 138  139 140 140  K 3.6 3.7 4.1   < > 4.0 3.5 3.8 3.9  CL 105 104 103   < > 103 105 105 105  CO2 30 27 33*   < > 26 28 28 26   GLUCOSE 133* 102* 160*   < > 113* 112* 122* 112*  BUN 16 21* 19   < > 12 11 9 9   CALCIUM 10.2 10.5* 10.4*   < > 8.6* 8.4* 8.6* 8.5*  CREATININE 0.78 1.05* 0.83   < > 0.57 0.41* 0.45 0.57  GFRNONAA >60 59* >60   < > >60 >60 >60 >60  GFRAA >60 >60 >60  --   --   --   --   --    < > = values in this interval not displayed.    LIVER FUNCTION TESTS: Recent Labs    01/18/21 0456 01/19/21 0523 01/20/21 0610 01/23/21 0537 01/25/21 0542 01/27/21 0459  BILITOT 0.5 0.4 0.5  --   --  0.6  AST 22 24 26   --   --  35  ALT 11 12 12   --   --  14  ALKPHOS 142* 145* 163*  --   --  263*  PROT 5.8* 5.7* 6.0*  --   --  6.4*  ALBUMIN 2.0* 2.0* 2.2* 2.2* 2.3* 2.2*    TUMOR MARKERS: No results for input(s): AFPTM, CEA, CA199, CHROMGRNA in the last 8760 hours.  Assessment and Plan: Abdominal wall abscess/fluid collection. Imaging reviewed and this is likely recurrent because of evident fistulous communication the bowel. Plan for percutaneous drain placement. Can eventually do drain injection to confirm/deny fistula. Labs reviewed. Risks and benefits discussed with the patient  including bleeding, infection, damage to adjacent structures, bowel perforation/fistula connection, and sepsis.  All of the patient's questions were answered, patient is agreeable to proceed. Consent signed and in chart.    Thank you for this interesting consult.  I greatly enjoyed meeting Charell Faulk and look forward to participating in their care.  A copy of this report was sent to the requesting provider on this date.  Electronically Signed: Ascencion Dike, PA-C 01/27/2021, 10:24 AM   I spent a total of 20 minutes in face to face in clinical consultation, greater than 50% of which was counseling/coordinating care for perc abscess drain

## 2021-01-27 NOTE — Progress Notes (Signed)
PROGRESS NOTE    Wendy Barber  ZOX:[id number] DOB: 06/13/1961 DOA: 01/16/2021 PCP: Glendon Axe, MD  Brief Narrative:  The patient is a 60 year old Caucasian female with a past medical history significant for but not limited to metastatic appendiceal cancer, pseudomyxoma peritonei and peritoneal carcinomatosis status post CRS/HIPEC and splenectomy in 2005 and recent CRS/HIPEC on 12/10/2020 for recurrent cancer, autoimmune hepatitis, DVT and PE with IVC filter currently on Lovenox, hypertension, diabetes mellitus type 2 as well as other comorbidities who presented to the ED with multiple complaints including intractable diarrhea since her recent procedure and fatigue as well as wound drainage.  Since her last hospitalization February she has had persistent diarrhea but has negative for C. difficile started on Imodium and Lomotil with some improvement but recently seen by her oncologist in [place] on 316 and states that she is started on antibiotics at that time.  Since then her diarrhea has worsened and she is more fatigued and has had a decreased appetite with increased abdominal pain.  She went to the bathroom and had an area of open incision with significant amount of purulent foul-smelling drainage.  Was admitted for C. difficile colitis and for concern for postop wound infection.  Surgery was initially evaluated and signed off the case however medical oncology obtained a CT scan of the abdomen pelvis yesterday which showed that her abdominal abscess appears to be little bit larger and that she may have a fistula so IR and General Surgery were reconsulted.  Assessment & Plan:   Principal Problem:   Surgical wound infection Active Problems:   Autoimmune hepatitis ([place])   DM2 (diabetes mellitus, type 2) ([place])   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)   DVT of deep femoral vein, left (HCC)   Pulmonary embolism, bilateral (HCC)   Presence of IVC filter   C. difficile  colitis   Abscess of abdominal wall   Hypokalemia  Abdominal postsurgical Wound Infection and Associated Abdominal Abscess -Patient presented with concerns for abdominal postsurgical wound infection. -Patient afebrile on room air, no leukocytosis. -Status post cytoreductive surgery/HI PEC 12/10/2020 at [facility] in [place], [place] with major reduction of small bowel, proximal jejunum, ascending colon, partial cystectomy. -Postop course for anastomotic leak and taken back to the OR on POD #11 with closure of abdominal wound on POD #14. -Status post JP drain recently removed 01/06/2021. -Initial CT abdomen and pelvis done concerning for anterior abdominal wall ventral surgical wound collection three, 3.2 x 1.8 x 13 cm concerning for infection as well as additional loculated fluid collection in the pelvis superior and anterior to the urinary bladder measuring 5.5 x 2.6 x 3.8 cm. -Asplenic. -General surgery following and suspected superficial wound seroma in abdominal wall, recommend wound packing daily, suspect fluid collection is sterile fluid collection is sterile. -Wound cultures with few actinobacter species, rare Candida albicans. -Discussed with general surgery about wound culture results and they DO NOT feel an active infection at this time and they DO NOT feel anything further to do in addition to current treatment for C. difficile colitis.   -Continued to Monitor leukocytosis and if worsening may need to add fluconazole but will defer to ID -Continue current pain management. -Saline lock IV fluids.   -Repeat CT abdomen and pelvis ordered per hematology/oncology on 01/26/21 for further evaluation of patient's abdominal pain and showed "Persistent inflammatory type changes involving the colon and small bowel. Slight interval increase in size of the anterior abdominal wall abscess which appears to  communicate with a smaller intra-abdominal abscess. Findings worrisome for a fistula.  Persistent  Fluid collection in the pelvis just above the bladder anteriorly. This could be a liquified hematoma or postoperative seroma. 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 This may account for some of the colonic inflammation which could be due to vascular congestion." -General surgery following peripherally but will be reconsulted for opinion upon repeat CT scan next-IR also reconsulted and patient to undergo CT-guided drainage procedure for abdominal wall abscess  C. difficile Enteritis and Diarrhea  -Patient with multiple loose stools, which seems to be slowing down after Lomotil given last night.   -Patient drank contrast with CT abdomen and pelvis and stated started to have loose stool again.  -Patient describing stool with some mucus noted in. -Afebrile.   -Continue oral vancomycin, probiotic and states diarrhea is slowing down  -Creon added per oncology due to concern for possible of short-bowel with respect to diarrhea as patient had told oncologist has a lot of stool that floats concerning for fatty content.  -Patient complained of abdominal pain since starting Creon and requested Creon to be discontinued which it has.   -Patient stated some improvement with abdominal pain after Creon discontinued.   -PPI discontinued patient started on Pepcid.  -A dose of Dificid was given the morning of 01/23/2021, however patient wants to continue oral vancomycin for now as she feels number of stools slowly decreasing. -CT abdomen and pelvis ordered per Oncology for further evaluation of patient's abdominal pain. -Continue oral vancomycin to complete a 14-day course on 01/30/19/2022. -ID was consulted, see recommendations and they are recommending agreeing with supportive care (Florastor and Polycarbophil) and avoiding systemic antibiotics at this time and recommending the patient finish a total of 14 days of p.o. vancomycin with the last day being  01/30/2019. -She continues to have some abdominal pain but continue with pain control with 2 mg of p.o. 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 daily with breakfast, 5 mg p.o. daily, and then 10 mg p.o. nightly.  Type 2 Diabetes Mellitus  -Hemoglobin A1c 5.8 (3/26) -Diet controlled. -Blood Sugars ranging from 105-122 on daily BMP/CMP -If necessary will place on sensitive NovoLog sliding scale insulin AC  Hypokalemia, improved -Secondary to GI losses.   -Potassium at 3.9 today.   -Magnesium at 2.0  -Continue to Monitor and Trend -Repeat CMP in the AM   Autoimmune Hepatitis -Continue to hold azathioprine and could likely resume once antibiotic course has been completed with clinical improvement.  -LFTs currently normal with an AST of 35 and ALT of 14  Normocytic Anemia -Patient's hemoglobin/hematocrit is relatively stable at 10.0/31.9 -Check anemia panel in the a.m. -Continue to monitor for signs and symptoms bleeding; currently no  overt bleeding noted -Repeat CBC in a.m.   DVT prophylaxis: Enoxaparin 40 mg sq q24h Code Status: FULL CODE  Family Communication: No family present at bedside  Disposition  Plan: Pending further clinical improvement and clearance by infectious diseases, medical oncology, general surgery and interventional radiology  Status is: Inpatient  Remains inpatient appropriate because:Unsafe d/c plan, IV treatments appropriate due to intensity of illness or inability to take PO and Inpatient level of care appropriate due to severity of illness   Dispo: The patient is from: Home              Anticipated d/c is to: TBD              Patient currently is not medically stable to d/c.   Difficult to place patient No  Consultants:   Medical Oncology  General Surgery  Interventional Radiology  Infectious Diseases    Procedures:   CT abdomen and pelvis 01/16/2021  CT abdomen and pelvis 01/26/2021  CT guided drainage procedure of abdominal wall abscess to be done 01/27/21  Antimicrobials:  Anti-infectives (From admission, onward)   Start     Dose/Rate Route Frequency Ordered Stop   01/23/21 1315  vancomycin (VANCOCIN) 50 mg/mL oral solution 125 mg        125 mg Oral Every 6 hours 01/23/21 1226 01/29/21 2359   01/23/21 1000  fidaxomicin (DIFICID) tablet 200 mg  Status:  Discontinued        200 mg Oral 2 times daily 01/23/21 0859 01/23/21 1226   01/17/21 0600  vancomycin (VANCOREADY) IVPB 1500 mg/300 mL  Status:  Discontinued        1,500 mg 150 mL/hr over 120 Minutes Intravenous Every 24 hours 01/16/21 1732 01/17/21 1216   01/16/21 2200  metroNIDAZOLE (FLAGYL) IVPB 500 mg  Status:  Discontinued        500 mg 100 mL/hr over 60 Minutes Intravenous Every 8 hours 01/16/21 1622 01/17/21 1216   01/16/21 2000  vancomycin (VANCOCIN) 50 mg/mL oral solution 125 mg  Status:  Discontinued        125 mg Oral 4 times daily 01/16/21 1805 01/23/21 0859   01/16/21 1800  ceFEPIme (MAXIPIME) 2 g in sodium chloride 0.9 % 100 mL IVPB  Status:  Discontinued        2 g 200 mL/hr over 30 Minutes Intravenous Every 8 hours 01/16/21 1728 01/17/21 1216   01/16/21 1545  vancomycin (VANCOCIN)  50 mg/mL oral solution 125 mg  Status:  Discontinued        125 mg Oral 4 times daily 01/16/21 1539 01/16/21 1622   01/16/21 1445  metroNIDAZOLE (FLAGYL) IVPB 500 mg        500 mg 100 mL/hr over 60 Minutes Intravenous  Once 01/16/21 1438 01/16/21 1622   01/16/21 1330  vancomycin (VANCOCIN) IVPB 1000 mg/200 mL premix        1,000 mg 200 mL/hr over 60 Minutes Intravenous  Once 01/16/21 1320 01/16/21 1538        Subjective: Patient continues to have significant abdominal pain but states that her diarrhea has improved overnight and has only had 1 bowel movement.  No nausea or vomiting does not feel well.  No other concerns or complaints at this time.  No chest pain or shortness of breath.  Objective: Vitals:   01/26/21 1512 01/26/21 2032 01/27/21 0520 01/27/21 1411  BP: (!) 147/96 (!) 156/96 (!) 142/91 (!) 144/84  Pulse: 89 85 75 70  Resp:  16  16 15  Temp: 97.7 F (36.5 C) 98 F (36.7 C) 98 F (36.7 C) 98.2 F (36.8 C)  TempSrc: Oral Oral Oral Oral  SpO2:  92% 91% 93%  Weight:      Height:        Intake/Output Summary (Last 24 hours) at 01/27/2021 1515 Last data filed at 01/27/2021 0900 Gross per 24 hour  Intake 574 ml  Output --  Net 574 ml   Filed Weights   01/16/21 1700  Weight: 102.1 kg   Examination: Physical Exam:  Constitutional: WN/WD chronically ill-appearing Caucasian female currently in mild distress appears calm but does appear uncomfortable,  Eyes:Lids and conjunctivae normal, sclerae anicteric  ENMT: External Ears, Nose appear normal. Grossly normal hearing.  Neck: Appears normal, supple, no cervical masses, normal ROM, no appreciable thyromegaly; no JVD Respiratory: Diminished to auscultation bilaterally, no wheezing, rales, rhonchi or crackles. Normal respiratory effort and patient is not tachypenic. No accessory muscle use.  Unlabored breathing Cardiovascular: RRR, no murmurs / rubs / gallops. S1 and S2 auscultated.  Minimal extremity edema Abdomen: Soft,  tender to palpate, distended secondary to body habitus.  Abdominal incisions appear clean dry intact with a mild area of breakdown in the upper abdomen.  Bowel sounds positive.  GU: Deferred. Musculoskeletal: No clubbing / cyanosis of digits/nails. No joint deformity upper and lower extremities. .  Skin: No rashes, lesions, ulcers on to skin evaluation. No induration; Warm and dry.  Neurologic: CN 2-12 grossly intact with no focal deficits. Romberg sign and cerebellar reflexes not assessed.  Psychiatric: Normal judgment and insight. Alert and oriented x 3. Normal mood and appropriate affect.   Data Reviewed: I have personally reviewed following labs and imaging studies  CBC: Recent Labs  Lab 01/21/21 0530 01/23/21 0537 01/25/21 0542 01/27/21 0459  WBC 9.3 9.5 9.3 8.8  NEUTROABS 4.3 4.3 3.5 3.4  HGB 9.6* 10.3* 9.8* 10.0*  HCT 31.3* 33.5* 31.8* 31.9*  MCV 99.7 99.4 99.7 98.8  PLT 261 310 347 694   Basic Metabolic Panel: Recent Labs  Lab 01/22/21 0502 01/23/21 0537 01/24/21 0524 01/25/21 0542 01/26/21 0557 01/27/21 0459  NA 138 140 138 139 140 140  K 3.9 4.1 4.0 3.5 3.8 3.9  CL 105 105 103 105 105 105  CO2 27 26 26 28 28 26   GLUCOSE 118* 113* 113* 112* 122* 112*  BUN 10 11 12 11 9 9   CREATININE 0.51 0.49 0.57 0.41* 0.45 0.57  CALCIUM 8.2* 8.5* 8.6* 8.4* 8.6* 8.5*  MG 2.0 1.9 1.8 1.8 2.0  --   PHOS  --  3.2  --  4.0  --   --    GFR: Estimated Creatinine Clearance: 92.4 mL/min (by C-G formula based on SCr of 0.57 mg/dL). Liver Function Tests: Recent Labs  Lab 01/23/21 0537 01/25/21 0542 01/27/21 0459  AST  --   --  35  ALT  --   --  14  ALKPHOS  --   --  263*  BILITOT  --   --  0.6  PROT  --   --  6.4*  ALBUMIN 2.2* 2.3* 2.2*   No results for input(s): LIPASE, AMYLASE in the last 168 hours. No results for input(s): AMMONIA in the last 168 hours. Coagulation Profile: No results for input(s): INR, PROTIME in the last 168 hours. Cardiac Enzymes: No results for  input(s): CKTOTAL, CKMB, CKMBINDEX, TROPONINI in the last 168 hours. BNP (last 3 results) No results for input(s): PROBNP in the last  8760 hours. HbA1C: No results for input(s): HGBA1C in the last 72 hours. CBG: No results for input(s): GLUCAP in the last 168 hours. Lipid Profile: No results for input(s): CHOL, HDL, LDLCALC, TRIG, CHOLHDL, LDLDIRECT in the last 72 hours. Thyroid Function Tests: No results for input(s): TSH, T4TOTAL, FREET4, T3FREE, THYROIDAB in the last 72 hours. Anemia Panel: No results for input(s): VITAMINB12, FOLATE, FERRITIN, TIBC, IRON, RETICCTPCT in the last 72 hours. Sepsis Labs: No results for input(s): PROCALCITON, LATICACIDVEN in the last 168 hours.  No results found for this or any previous visit (from the past 240 hour(s)).   RN Pressure Injury Documentation:     Estimated body mass index is 36.32 kg/m as calculated from the following:   Height as of this encounter: 5\' 6"  (1.676 m).   Weight as of this encounter: 102.1 kg.  Malnutrition Type:  Nutrition Problem: Increased nutrient needs Etiology: cancer and cancer related treatments   Malnutrition Characteristics:  Signs/Symptoms: estimated needs   Nutrition Interventions:  Interventions: Juven,MVI,Premier Protein   Radiology Studies: CT ABDOMEN PELVIS W CONTRAST  Result Date: 01/26/2021 CLINICAL DATA:  Abdominal pain for the past few months. History of surgery in February. EXAM: CT ABDOMEN AND PELVIS WITH CONTRAST TECHNIQUE: Multidetector CT imaging of the abdomen and pelvis was performed using the standard protocol following bolus administration of intravenous contrast. CONTRAST:  126mL OMNIPAQUE IOHEXOL 300 MG/ML  SOLN COMPARISON:  01/16/2021 FINDINGS: Lower chest: The lung bases are clear of an acute process. No pleural effusions. The heart is within normal limits in size. No pericardial effusion. Hepatobiliary: No hepatic lesions or intrahepatic biliary dilatation. The gallbladder is  surgically absent. Stable mild associated common bile duct dilatation. Pancreas: No mass, inflammation or ductal dilatation. Spleen: Status post splenectomy. Adrenals/Urinary Tract: Adrenal glands and kidneys are unremarkable and stable. The bladder is grossly normal. Stomach/Bowel: The stomach, duodenum and small bowel are grossly normal. There are extensive surgical changes involving the colon and small bowel. Persistent inflammatory type changes involving the colon without obvious mass or obstruction. Slightly thickened small bowel loops in the anterior abdomen/pelvis area these bowel loops appear clustered together near some surgical changes and this could be an adhesive process. Is also a small fluid collection and a small amount of gas near the anterior abdominal wall which appears to communicate with a rim enhancing abscess in the anterior abdominal wall. Findings suspicious for a fistula. The anterior abdominal wall abscess is slightly larger when compared to the recent prior CT. It measures approximately 4.4 x 3.4 cm. Stable surgical changes involving the rectum. Persistent fluid collection in the pelvis just above the bladder anteriorly. This measures approximately 4.5 by 2.5 cm and previously measured 5.0 x 2.6 cm. I do not see any gas in this collection and it could be a liquified hematoma or postoperative seroma. Vascular/Lymphatic: The aorta and branch vessels are patent. 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 This may account for some of the colonic inflammation which could be due to vascular congestion. The portal vein is patent. Reproductive: Surgically absent. Other: Mesenteric edema but no overt ascites. Musculoskeletal: No significant bony findings. IMPRESSION: 1. Persistent inflammatory type changes involving the colon and small bowel. 2. Slight interval increase in size of the anterior abdominal wall abscess which appears to  communicate with a smaller intra-abdominal abscess. Findings worrisome for a fistula. 3. Persistent fluid collection in the pelvis just above the bladder anteriorly. This could be  a liquified hematoma or postoperative seroma. 4. 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 This may account for some of the colonic inflammation which could be due to vascular congestion. Electronically Signed   By: Marijo Sanes M.D.   On: 01/26/2021 18:35   Scheduled Meds: . diphenhydrAMINE      . amLODipine  10 mg Oral Daily  . busPIRone  5 mg Oral Q breakfast   And  . busPIRone  5 mg Oral Q1200   And  . busPIRone  10 mg Oral QHS  . enoxaparin  40 mg Subcutaneous Q24H  . famotidine  40 mg Oral Daily  . fentaNYL  1 patch Transdermal Q72H  . fentaNYL      . Gerhardt's butt cream   Topical QID  . midazolam      . multivitamin with minerals  1 tablet Oral Daily  . polycarbophil  625 mg Oral Daily  . propranolol  20 mg Oral BID  . saccharomyces boulardii  250 mg Oral BID  . sodium chloride flush  3 mL Intravenous Q12H  . vancomycin  125 mg Oral Q6H   Continuous Infusions:   LOS: 11 days   Kerney Elbe, DO [facility] PAGER is on AMION  If 7PM-7AM, please contact night-coverage [email or web address]

## 2021-01-27 NOTE — Procedures (Signed)
Pre procedural Dx: Post-op abdominal abscess Post procedural Dx: Same  Technically successful CT guided placed of a 10 Fr drainage catheter placement into the indeterminate fluid collection within the midline of the abdomen, subjacent to contained subcutaneous collection yielding 65 cc of amber colored fluid.   Drain was connected to a gravity bag.   A representative aspirated sample was capped and sent to the laboratory for analysis.    EBL: Trace Complications: None immediate  Ronny Bacon, MD Pager #: [phone]

## 2021-01-27 NOTE — Progress Notes (Signed)
She is feeling a little bit better.  There was less diarrhea.  Her pain seems to be doing a little bit better.  She is on a Duragesic patch now.  Unfortunately, the CT scan is quite informative.  The abscess appears to be little bit larger.  She may have a fistula.  Also noted is a chronic thrombus in the mesenteric vein.  I would have to think that she is going need to have his abscess drained.  I would also think that surgery needs to be reinvolved to see if there is anything that needs to be done with respect to this possible fistula or how to evaluate for a fistula.  I am not sure if we need to get her on formal anticoagulation.  This is mentioned as a chronic thrombus.  All of her past scans do not mention anything about a thrombus.  We will have to get radiology looking the past scans to see if there is thrombus that truly is chronic.  Her labs show white cell count of 8.8.  Hemoglobin 10 hematocrit 31.9.  Platelet count 392,000.  Her albumin is 2.2.  BUN 9 creatinine 0.57.  Potassium 3.9.  On examination, there really is no changes.  Her abdomen soft.  She has decreased bowel sounds.  There is some tenderness in the lower abdomen below the umbilicus where she has this dressing.  Again, a lot of was going on is potentially surgical in nature.  Surgery needs to be involved again.  Interventional radiology needs to be involved to see about draining the abscess.  We need radiology to review her last scans to see if there is truly a chronic thrombus in the mesenteric vein.  I appreciate everybody's help with her.  This is certainly is more complicated now.   [name], MD  2 Thessalonians 3:3

## 2021-01-28 DIAGNOSIS — L02211 Cutaneous abscess of abdominal wall: Secondary | ICD-10-CM | POA: Diagnosis not present

## 2021-01-28 DIAGNOSIS — K754 Autoimmune hepatitis: Secondary | ICD-10-CM | POA: Diagnosis not present

## 2021-01-28 DIAGNOSIS — A0472 Enterocolitis due to Clostridium difficile, not specified as recurrent: Secondary | ICD-10-CM | POA: Diagnosis not present

## 2021-01-28 DIAGNOSIS — T8149XA Infection following a procedure, other surgical site, initial encounter: Secondary | ICD-10-CM | POA: Diagnosis not present

## 2021-01-28 LAB — MAGNESIUM: Magnesium: 1.9 mg/dL (ref 1.7–2.4)

## 2021-01-28 LAB — CBC WITH DIFFERENTIAL/PLATELET
Abs Immature Granulocytes: 0.06 10*3/uL (ref 0.00–0.07)
Basophils Absolute: 0.1 10*3/uL (ref 0.0–0.1)
Basophils Relative: 1 %
Eosinophils Absolute: 0.2 10*3/uL (ref 0.0–0.5)
Eosinophils Relative: 2 %
HCT: 35 % — ABNORMAL LOW (ref 36.0–46.0)
Hemoglobin: 10.7 g/dL — ABNORMAL LOW (ref 12.0–15.0)
Immature Granulocytes: 1 %
Lymphocytes Relative: 42 %
Lymphs Abs: 4.4 10*3/uL — ABNORMAL HIGH (ref 0.7–4.0)
MCH: 30.8 pg (ref 26.0–34.0)
MCHC: 30.6 g/dL (ref 30.0–36.0)
MCV: 100.9 fL — ABNORMAL HIGH (ref 80.0–100.0)
Monocytes Absolute: 1.1 10*3/uL — ABNORMAL HIGH (ref 0.1–1.0)
Monocytes Relative: 10 %
Neutro Abs: 4.6 10*3/uL (ref 1.7–7.7)
Neutrophils Relative %: 44 %
Platelets: 408 10*3/uL — ABNORMAL HIGH (ref 150–400)
RBC: 3.47 MIL/uL — ABNORMAL LOW (ref 3.87–5.11)
RDW: 19.4 % — ABNORMAL HIGH (ref 11.5–15.5)
WBC: 10.4 10*3/uL (ref 4.0–10.5)
nRBC: 0 % (ref 0.0–0.2)

## 2021-01-28 LAB — COMPREHENSIVE METABOLIC PANEL
ALT: 18 U/L (ref 0–44)
AST: 32 U/L (ref 15–41)
Albumin: 2.3 g/dL — ABNORMAL LOW (ref 3.5–5.0)
Alkaline Phosphatase: 275 U/L — ABNORMAL HIGH (ref 38–126)
Anion gap: 10 (ref 5–15)
BUN: 10 mg/dL (ref 6–20)
CO2: 26 mmol/L (ref 22–32)
Calcium: 8.6 mg/dL — ABNORMAL LOW (ref 8.9–10.3)
Chloride: 103 mmol/L (ref 98–111)
Creatinine, Ser: 0.48 mg/dL (ref 0.44–1.00)
GFR, Estimated: >60 mL/min (ref >60)
Glucose, Bld: 109 mg/dL — ABNORMAL HIGH (ref 70–99)
Potassium: 3.4 mmol/L — ABNORMAL LOW (ref 3.5–5.1)
Sodium: 139 mmol/L (ref 135–145)
Total Bilirubin: 0.7 mg/dL (ref 0.3–1.2)
Total Protein: 6.7 g/dL (ref 6.5–8.1)

## 2021-01-28 LAB — VITAMIN B12: Vitamin B-12: 240 pg/mL (ref 180–914)

## 2021-01-28 LAB — IRON AND TIBC
Iron: 27 ug/dL — ABNORMAL LOW (ref 28–170)
Saturation Ratios: 9 % — ABNORMAL LOW (ref 10.4–31.8)
TIBC: 300 ug/dL (ref 250–450)
UIBC: 273 ug/dL

## 2021-01-28 LAB — FOLATE: Folate: 15.5 ng/mL (ref >5.9)

## 2021-01-28 LAB — FERRITIN: Ferritin: 76 ng/mL (ref 11–307)

## 2021-01-28 LAB — PHOSPHORUS: Phosphorus: 3.8 mg/dL (ref 2.5–4.6)

## 2021-01-28 LAB — RETICULOCYTES
Immature Retic Fract: 25.1 % — ABNORMAL HIGH (ref 2.3–15.9)
RBC.: 3.41 MIL/uL — ABNORMAL LOW (ref 3.87–5.11)
Retic Count, Absolute: 106.7 10*3/uL (ref 19.0–186.0)
Retic Ct Pct: 3.1 % (ref 0.4–3.1)

## 2021-01-28 MED ORDER — SODIUM CHLORIDE 0.9 % IV SOLN
510.0000 mg | Freq: Once | INTRAVENOUS | Status: AC
  Administered 2021-01-28: 510 mg via INTRAVENOUS
  Filled 2021-01-28: qty 510

## 2021-01-28 MED ORDER — METRONIDAZOLE IN NACL 5-0.79 MG/ML-% IV SOLN
500.0000 mg | Freq: Three times a day (TID) | INTRAVENOUS | Status: DC
  Administered 2021-01-28 – 2021-01-29 (×3): 500 mg via INTRAVENOUS
  Filled 2021-01-28 (×3): qty 100

## 2021-01-28 MED ORDER — HYDROMORPHONE HCL 1 MG/ML IJ SOLN
1.0000 mg | INTRAMUSCULAR | Status: DC | PRN
  Administered 2021-01-28 – 2021-02-04 (×49): 1 mg via INTRAVENOUS
  Filled 2021-01-28 (×50): qty 1

## 2021-01-28 MED ORDER — CIPROFLOXACIN IN D5W 400 MG/200ML IV SOLN
400.0000 mg | Freq: Two times a day (BID) | INTRAVENOUS | Status: DC
  Administered 2021-01-28 – 2021-01-29 (×2): 400 mg via INTRAVENOUS
  Filled 2021-01-28 (×3): qty 200

## 2021-01-28 MED ORDER — HYDROMORPHONE HCL 2 MG PO TABS
2.0000 mg | ORAL_TABLET | ORAL | Status: DC | PRN
  Administered 2021-01-28 – 2021-02-10 (×42): 2 mg via ORAL
  Filled 2021-01-28 (×45): qty 1

## 2021-01-28 MED ORDER — SCOPOLAMINE 1 MG/3DAYS TD PT72
1.0000 | MEDICATED_PATCH | TRANSDERMAL | Status: DC
  Administered 2021-01-28 – 2021-02-06 (×4): 1.5 mg via TRANSDERMAL
  Filled 2021-01-28 (×4): qty 1

## 2021-01-28 MED ORDER — LIP MEDEX EX OINT
TOPICAL_OINTMENT | CUTANEOUS | Status: AC
  Administered 2021-01-28: 1
  Filled 2021-01-28: qty 7

## 2021-01-28 MED ORDER — POTASSIUM CHLORIDE CRYS ER 20 MEQ PO TBCR
40.0000 meq | EXTENDED_RELEASE_TABLET | Freq: Two times a day (BID) | ORAL | Status: AC
  Administered 2021-01-28 – 2021-01-29 (×2): 40 meq via ORAL
  Filled 2021-01-28 (×2): qty 2

## 2021-01-28 NOTE — Progress Notes (Signed)
[name] now has a drain into the abscess.  She had 65 cc of fluid taken out yesterday.  There is still some drainage.  There is abundant white blood cells in the fluid.  We will have to see what the cultures show.  She is still having quite a bit of discomfort.  We have her on a Duragesic patch now.  Hopefully this will help.  The diarrhea seems to be getting better.  She has 1 more day of the oral vancomycin for her C. difficile.  Surgery still needs to see her to see if there is any issues with the possibility of a fistula.  Hopefully, this is not a surgical problem.  Her labs today do show quite a low iron level.  Her saturation is only 9%.  We will have to give her some IV iron.  Her albumin is 2.3.  Her white cell count is 10.4.  Hemoglobin 10.7.  She is not eating all that much.  She really is not out of bed that much.  This truly is a lengthy process that she is dealing with.  We will be able to weeks that she has been in the hospital.  Her vital signs are all stable.  Temperature is 98.9.  Pulse 85.  Blood pressure 157/86.  Abdominal exam shows a soft abdomen.  Bowel sounds are decreased.  She has the drain is to come out of the abdominal wound.  There is no guarding or rebound tenderness.  Lungs are clear.  Cardiac exam regular rate and rhythm.  We will have to see what the abdominal fluid culture shows from this abscess.  Hopefully, there will not be any serious infection.  I know that she grew out Acinetobacter in the past.  This may be with growing out again.  We will give him IV iron.  We will see how this can help.  I just feel bad that she is having these complications.  I realize that the surgery that she had up in [place] is incredibly aggressive and definitely has the potential for complications.  I know the staff on [address] are doing a fantastic job with her.  [name], MD  Psalms 55:16

## 2021-01-28 NOTE — Progress Notes (Signed)
Referring Physician(s): Volanda Napoleon (oncology)  Supervising Physician: Markus Daft  Patient Status:  [facility] - In-pt  Chief Complaint: "Nauseous"  Subjective:  History of post-op intra-abdominal fluid collection s/p midline lower abdominal drain placement in IR 01/27/2021. Patient awake and alert laying in bed. Student RN at bedside. Complains of nausea, denies vomiting. Midline lower abdominal drain site c/d/i.   Allergies: Penicillins, Alprazolam, Ativan [lorazepam], Corticosteroids, Erythromycin, Erythromycin base, Milnacipran hcl, Prednisone, Savella  [milnacipran], Povidone iodine, and Prednisolone  Medications: Prior to Admission medications   Medication Sig Start Date End Date Taking? Authorizing Provider  busPIRone (BUSPAR) 5 MG tablet Take 5 mg by mouth See admin instructions. Takes 1 tablet in the morning, 1 tablet in the afternoon and 2 tablets at night   Yes [provider]  diphenoxylate-atropine (LOMOTIL) 2.5-0.025 MG tablet Take 1 tablet by mouth daily as needed for diarrhea or loose stools. 01/15/21  Yes [provider]  enoxaparin (LOVENOX) 40 MG/0.4ML injection Inject 40 mg into the skin daily. 01/01/21  Yes [provider]  HYDROmorphone (DILAUDID) 2 MG tablet Take 2 mg by mouth every 4 (four) hours as needed for moderate pain. 01/08/21  Yes [provider]  loperamide (IMODIUM) 2 MG capsule Take 4 mg by mouth in the morning, at noon, in the evening, and at bedtime. 01/01/21  Yes [provider]  ondansetron (ZOFRAN) 8 MG tablet Take 8 mg by mouth every 8 (eight) hours as needed for nausea or vomiting. 02/26/20  Yes [provider]  pantoprazole (PROTONIX) 40 MG tablet Take 40 mg by mouth every morning. 01/01/21  Yes [provider]  diazepam (VALIUM) 5 MG tablet Take 40 minutes prior to MRI Patient not taking: No sig reported 07/16/20   Volanda Napoleon, MD  insulin glargine (LANTUS) 100 UNIT/ML injection  Inject 0.1 mLs (10 Units total) into the skin at bedtime. Before bed Patient not taking: No sig reported 11/26/19   Nita Sells, MD  insulin lispro (HUMALOG) 100 UNIT/ML injection Inject 0.08 mLs (8 Units total) into the skin 3 (three) times daily before meals. Patient not taking: No sig reported 11/26/19   Nita Sells, MD     Vital Signs: BP (!) 154/93 (BP Location: Right Arm)   Pulse 88   Temp 98.9 F (37.2 C) (Oral)   Resp 14   Ht 5\' 6"  (1.676 m)   Wt 225 lb (102.1 kg)   SpO2 99%   BMI 36.32 kg/m   Physical Exam Vitals and nursing note reviewed.  Constitutional:      General: She is not in acute distress. Pulmonary:     Effort: Pulmonary effort is normal. No respiratory distress.  Abdominal:     Comments: (+) midline wound, well-healed. Midline lower abdominal drain site without tenderness, erythema, drainage, or active bleeding; minimal output of foul-smelling purulent output with feculent debris in gravity bag (RN just emptied 200 cc prior to my arrival); drain flushes/aspirates without resistance.  Skin:    General: Skin is warm and dry.  Neurological:     Mental Status: She is alert and oriented to person, place, and time.     Imaging: CT ABDOMEN PELVIS W CONTRAST  Result Date: 01/26/2021 CLINICAL DATA:  Abdominal pain for the past few months. History of surgery in February. EXAM: CT ABDOMEN AND PELVIS WITH CONTRAST TECHNIQUE: Multidetector CT imaging of the abdomen and pelvis was performed using the standard protocol following bolus administration of intravenous contrast. CONTRAST:  171mL OMNIPAQUE  IOHEXOL 300 MG/ML  SOLN COMPARISON:  01/16/2021 FINDINGS: Lower chest: The lung bases are clear of an acute process. No pleural effusions. The heart is within normal limits in size. No pericardial effusion. Hepatobiliary: No hepatic lesions or intrahepatic biliary dilatation. The gallbladder is surgically absent. Stable mild associated common bile duct dilatation.  Pancreas: No mass, inflammation or ductal dilatation. Spleen: Status post splenectomy. Adrenals/Urinary Tract: Adrenal glands and kidneys are unremarkable and stable. The bladder is grossly normal. Stomach/Bowel: The stomach, duodenum and small bowel are grossly normal. There are extensive surgical changes involving the colon and small bowel. Persistent inflammatory type changes involving the colon without obvious mass or obstruction. Slightly thickened small bowel loops in the anterior abdomen/pelvis area these bowel loops appear clustered together near some surgical changes and this could be an adhesive process. Is also a small fluid collection and a small amount of gas near the anterior abdominal wall which appears to communicate with a rim enhancing abscess in the anterior abdominal wall. Findings suspicious for a fistula. The anterior abdominal wall abscess is slightly larger when compared to the recent prior CT. It measures approximately 4.4 x 3.4 cm. Stable surgical changes involving the rectum. Persistent fluid collection in the pelvis just above the bladder anteriorly. This measures approximately 4.5 by 2.5 cm and previously measured 5.0 x 2.6 cm. I do not see any gas in this collection and it could be a liquified hematoma or postoperative seroma. Vascular/Lymphatic: The aorta and branch vessels are patent. 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 This may account for some of the colonic inflammation which could be due to vascular congestion. The portal vein is patent. Reproductive: Surgically absent. Other: Mesenteric edema but no overt ascites. Musculoskeletal: No significant bony findings. IMPRESSION: 1. Persistent inflammatory type changes involving the colon and small bowel. 2. Slight interval increase in size of the anterior abdominal wall abscess which appears to communicate with a smaller intra-abdominal abscess. Findings worrisome for a  fistula. 3. Persistent fluid collection in the pelvis just above the bladder anteriorly. This could be a liquified hematoma or postoperative seroma. 4. 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 This may account for some of the colonic inflammation which could be due to vascular congestion. Electronically Signed   By: Marijo Sanes M.D.   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 Please perform CT-guided per drainage catheter for infection source control purposes. EXAM: CT IMAGE GUIDED FLUID DRAIN BY CATHETER COMPARISON:  CT abdomen pelvis-01/26/2021; 01/16/2021 MEDICATIONS: The patient is currently admitted to the hospital and receiving intravenous antibiotics. The antibiotics were administered within an appropriate time frame prior to the initiation of the procedure. ANESTHESIA/SEDATION: Moderate (conscious) sedation was employed during this procedure. A total of Versed 2 mg and Fentanyl 100 mcg was administered intravenously. Moderate Sedation Time: 14 minutes. The patient's level of consciousness and vital signs were monitored continuously by radiology nursing throughout the procedure under my direct supervision. CONTRAST:  None COMPLICATIONS: None immediate. PROCEDURE: Informed written consent was obtained from the patient after a discussion of the risks, benefits and alternatives to treatment. The patient was placed supine on the CT gantry and a pre procedural CT was performed re-demonstrating the known abscess/fluid collection  within the midline of the abdomen with dominant intraperitoneal component measuring approximately 2.9 x 2.3 cm and subcutaneous component measuring approximately 4.4 x 3.1 cm (image 2, series 3). The  procedure was planned. A timeout was performed prior to the initiation of the procedure. The skin overlying the ventral aspect of the mid abdomen was prepped and draped in the usual sterile fashion. The overlying soft tissues were anesthetized with 1% lidocaine with epinephrine. Next, a 65 gauge trocar needle was advanced into the abscess/fluid collection within the peritoneal cavity through the subcutaneous component and a short Amplatz super stiff wire was coiled within the collection. Appropriate positioning was confirmed with a limited CT scan. The tract was serially dilated allowing placement of a 12 [place] all-purpose drainage catheter. Appropriate positioning was confirmed with a limited postprocedural CT scan. Next, approximately 65 ml of amber colored fluid was aspirated. The tube was connected to a drainage bag and sutured in place. Postprocedural imaging was obtained demonstrating near complete resolution of both the intraperitoneal and subcutaneous components of the collection (series 9). Dressings were applied. The patient tolerated the procedure well without immediate post procedural complication. IMPRESSION: Successful CT guided placement of a 83 French all purpose drain catheter into the midline abdominal abscess with aspiration of 65 mL of purulent fluid. Samples were sent to the laboratory as requested by the ordering clinical team. Electronically Signed   By: Sandi Mariscal M.D.   On: 01/27/2021 16:35    Labs:  CBC: Recent Labs    01/23/21 0537 01/25/21 0542 01/27/21 0459 01/28/21 0453  WBC 9.5 9.3 8.8 10.4  HGB 10.3* 9.8* 10.0* 10.7*  HCT 33.5* 31.8* 31.9* 35.0*  PLT 310 347 392 408*    COAGS: No results for input(s): INR, APTT in the last 8760 hours.  BMP: Recent Labs    03/19/20 1109 05/01/20 1156 07/27/20 0922 10/28/20 0956 01/25/21 0542 01/26/21 0557 01/27/21 0459 01/28/21 0453  NA 143 141 143   < > 139 140 140 139  K 3.6 3.7 4.1   < > 3.5 3.8 3.9 3.4*  CL 105  104 103   < > 105 105 105 103  CO2 30 27 33*   < > 28 28 26 26   GLUCOSE 133* 102* 160*   < > 112* 122* 112* 109*  BUN 16 21* 19   < > 11 9 9 10   CALCIUM 10.2 10.5* 10.4*   < > 8.4* 8.6* 8.5* 8.6*  CREATININE 0.78 1.05* 0.83   < > 0.41* 0.45 0.57 0.48  GFRNONAA >60 59* >60   < > >60 >60 >60 >60  GFRAA >60 >60 >60  --   --   --   --   --    < > = values in this interval not displayed.    LIVER FUNCTION TESTS: Recent Labs    01/19/21 0523 01/20/21 0610 01/23/21 0537 01/25/21 0542 01/27/21 0459 01/28/21 0453  BILITOT 0.4 0.5  --   --  0.6 0.7  AST 24 26  --   --  35 32  ALT 12 12  --   --  14 18  ALKPHOS 145* 163*  --   --  263* 275*  PROT 5.7* 6.0*  --   --  6.4* 6.7  ALBUMIN 2.0* 2.2* 2.2* 2.3* 2.2* 2.3*    Assessment and Plan:  History of post-op intra-abdominal fluid collection s/p midline lower abdominal drain placement in IR 01/27/2021. Midline lower abdominal drain stable with minimal output  of foul-smelling purulent output with feculent debris in gravity bag (RN just emptied 200 cc prior to my arrival, additional 75 cc output from drain in past 24 hours per chart). Continue current drain management- continue with Qshift flushes/monitor of output. Plan for repeat CT/possible drain injection when output <10 cc/day (assess for possible removal). Further plans per [facility]/CCS/oncology- appreciate and agree with management. IR to follow.   Electronically Signed: Earley Abide, PA-C 01/28/2021, 10:17 AM   I spent a total of 15 Minutes at the the patient's bedside AND on the patient's hospital floor or unit, greater than 50% of which was counseling/coordinating care for intra-abdominal fluid collection s/p drain placement.

## 2021-01-28 NOTE — Progress Notes (Signed)
PROGRESS NOTE    Jabree Pernice  SJG:[id number] DOB: [birth year] DOA: 01/16/2021 PCP: Glendon Axe, MD  Brief Narrative:  The patient is a 60 year old Caucasian female with a past medical history significant for but not limited to metastatic appendiceal cancer, pseudomyxoma peritonei and peritoneal carcinomatosis status post CRS/HIPEC and splenectomy in 2005 and recent CRS/HIPEC on 12/10/2020 for recurrent cancer, autoimmune hepatitis, DVT and PE with IVC filter currently on Lovenox, hypertension, diabetes mellitus type 2 as well as other comorbidities who presented to the ED with multiple complaints including intractable diarrhea since her recent procedure and fatigue as well as wound drainage.  Since her last hospitalization February she has had persistent diarrhea but has negative for C. difficile started on Imodium and Lomotil with some improvement but recently seen by her oncologist in [place] on 316 and states that she is started on antibiotics at that time.  Since then her diarrhea has worsened and she is more fatigued and has had a decreased appetite with increased abdominal pain.  She went to the bathroom and had an area of open incision with significant amount of purulent foul-smelling drainage.  Was admitted for C. difficile colitis and for concern for postop wound infection.  Surgery was initially evaluated and signed off the case however medical oncology obtained a CT scan of the abdomen pelvis yesterday which showed that her abdominal abscess appears to be little bit larger and that she may have a fistula so IR and General Surgery were reconsulted.   Interventional radiology placed a 10 French drain in her abscess on 01/27/21 and it yielded 65 cc of amber-colored fluid.  Her drain was connected to gravity bag and was sent off for culture and is growing out Klebsiella pneumoniae so ID is recommending Cipro/Flagyl given her PCN Allergy.  General Surgery was reconsulted and they evaluated the  patient and feel that her fistula is related to her surgery in [place] in February.  Dr. Harlow Asa evaluated recommends continuing the drain and having the patient follow-up in the IR drain clinic in 10 to 14 days and may need a drain injection to evaluate for a fistula.  Interventional radiology recommends continuing current drain management with every shift flushes and monitoring of output and planning repeat CT scan and possible drain injection when output is less than 10 cc a day to assess for possible removal.  Assessment & Plan:   Principal Problem:   Surgical wound infection Active Problems:   Autoimmune hepatitis ([place])   DM2 (diabetes mellitus, type 2) ([place])   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[place])   DVT of deep femoral vein, left ([place])   Pulmonary embolism, bilateral ([facility])   Presence of IVC filter   C. difficile colitis   Abscess of abdominal wall   Hypokalemia  Abdominal postsurgical Wound Infection and Associated Abdominal Abscess status post midline lower abdominal drain placement by interventional radiology on 01/27/2021 -Patient presented with concerns for abdominal postsurgical wound infection. -Patient afebrile on room air, no leukocytosis. -Status post cytoreductive surgery/HI PEC 12/10/2020 at [facility] in [place], [place] with major reduction of small bowel, proximal jejunum, ascending colon, partial cystectomy. -Postop course for anastomotic leak and taken back to the OR on POD #12 with closure of abdominal wound on POD #15. -Status post JP drain recently removed 01/06/2021. -Initial CT abdomen and pelvis done concerning for anterior abdominal wall ventral surgical wound collection three, 3.2 x 1.8 x 13 cm concerning for infection as well as additional loculated fluid collection  in the pelvis superior and anterior to the urinary bladder measuring 5.5 x 2.6 x 3.8 cm. -Asplenic. -General surgery following and suspected superficial wound seroma  in abdominal wall, recommend wound packing daily, suspect fluid collection is sterile fluid collection is sterile. -Wound cultures with few actinobacter species, rare Candida albicans. -Discussed with general surgery about wound culture results and they DO NOT feel an active infection at this time and they DO NOT feel anything further to do in addition to current treatment for C. difficile colitis.   -Continued to Monitor leukocytosis and is slowly trending up and if worsening may need to add fluconazole but will defer to ID; ID recommending Cipro/Flagyl given Klebsiella from Drain Cx -Continue current pain management. -Saline lock IV fluids.   -Repeat CT abdomen and pelvis ordered per hematology/oncology on 01/26/21 for further evaluation of patient's abdominal pain and showed "Persistent inflammatory type changes involving the colon and small bowel. Slight interval increase in size of the anterior abdominal wall abscess which appears to communicate with a smaller intra-abdominal abscess. Findings worrisome for a fistula. Persistent  Fluid collection in the pelvis just above the bladder anteriorly. This could be a liquified hematoma or postoperative seroma. 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 This may account for some of the colonic inflammation which could be due to vascular congestion. -IR also reconsulted and patient to underwentCT-guided drainage procedure for abdominal wall abscess -Interventional radiology placed a 10 French drain in her abscess on 01/27/21 and it yielded 65 cc of amber-colored fluid.  Her drain was connected to gravity bag and was sent off for culture and is growing out Klebsiella pneumoniae so ID is recommending Cipro/Flagyl given her PCN Allergy.   -General Surgery was reconsulted and they evaluated the patient and feel that her fistula is related to her surgery in [place] in February. Dr. Harlow Asa evaluated recommends continuing the  drain and having the patient follow-up in the IR drain clinic in 10 to 14 days and may need a drain injection to evaluate for a fistula.  Interventional radiology recommends continuing current drain management with every shift flushes and monitoring of output and planning repeat CT scan and possible drain injection when output is less than 10 cc a day to assess for possible removal.  C. difficile Enteritis and Diarrhea; improving  -Patient with multiple loose stools, which seems to be slowing down after Lomotil given last night.   -Patient drank contrast with CT abdomen and pelvis and stated started to have loose stool again.  -Patient describing stool with some mucus noted in. -Afebrile.   -Continue oral vancomycin, probiotic and states diarrhea is slowing down  -Creon added per oncology due to concern for possible of short-bowel with respect to diarrhea as patient had told oncologist has a lot of stool that floats concerning for fatty content.  -Patient complained of abdominal pain since starting Creon and requested Creon to be discontinued which it has.   -Patient stated some improvement with abdominal pain after Creon discontinued.   -PPI discontinued patient started on Pepcid.  -A dose of Dificid was given the morning of 01/23/2021, however patient wants to continue oral vancomycin for now as she feels number of stools slowly decreasing. -CT abdomen and pelvis ordered per Oncology for further evaluation of patient's abdominal pain. -Continue oral vancomycin to complete a 14-day course on 01/29/2021. -ID was consulted, see recommendations and they are recommending agreeing with supportive care (Florastor and Polycarbophil) and avoiding systemic  antibiotics at this time and recommending the patient finish a total of 14 days of p.o. vancomycin with the last day being 01/30/2019. -She continues to have some abdominal pain but continue with pain control with 2 mg of p.o. hydromorphone every 4 hours as  needed for severe pain  Metastatic appendiceal cancer, pseudomyxoma peritonei and peritoneal carcinomatosis status post CRS/HI PEC and splenectomy in 2005 and recently CRS/HI PEC on 12/10/2020 for recurrent cancer -Medical Oncology consulted and appreciate further evaluation and Recommendations -Will need to follow up at D/C with Surgeon in [facility] or Specialist at [facility]  History of DVT and PE status post IVC filter -Continue home regimen prophylactic Lovenox 40 mg sq q24h but may need to escalate given 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 daily with breakfast, 5 mg p.o. daily, and then 10 mg p.o. nightly.  Type 2 Diabetes Mellitus  -Hemoglobin A1c 5.8 (3/26) -Diet controlled. -Blood Sugars ranging from 105-122 on daily BMP/CMP -If necessary will place on sensitive NovoLog sliding scale insulin AC  Hypokalemia -Secondary to GI losses.   -Potassium at 3.4 today and will repelte with po KCl 40 mEQ -Magnesium at 1.9 -Continue to Monitor and Trend -Repeat CMP in the AM   Autoimmune Hepatitis -Continue to hold azathioprine and could likely resume once antibiotic course has been completed with clinical improvement.  -LFTs currently normal with an AST of 32 and ALT of 18  Normocytic Anemia -Patient's hemoglobin/hematocrit is relatively stable at 10.0/31.9 -> 10.7/35.0 -Checked Anemia Panel and showed an iron level of 27, U IBC 273, TIBC of 300, saturation ratios of 9%, ferritin level of 76, folate level 15.5, vitamin B12 240 -Continue to monitor for signs and symptoms bleeding; currently no overt bleeding noted -Repeat CBC in a.m.  Thrombocytosis -Patient's  Platelet Count went from 310 -> 347 -> 392 -> 408 -Continue to Monitor and Trend -Repeat CBC in the AM   DVT prophylaxis: Enoxaparin 40 mg sq q24h Code Status: FULL CODE  Family Communication: No family present at bedside  Disposition Plan: Pending further clinical improvement and clearance by infectious diseases, medical oncology, general surgery and interventional radiology  Status is: Inpatient  Remains inpatient appropriate because:Unsafe d/c plan, IV treatments appropriate due to intensity of illness or inability to take PO and Inpatient level of care appropriate due to severity of illness  Dispo: The patient is from: Home              Anticipated d/c is to: TBD              Patient currently is not medically stable to d/c.   Difficult to place patient No  Consultants:   Medical Oncology  General Surgery  Interventional Radiology  Infectious Diseases    Procedures:   CT abdomen and pelvis 01/16/2021  CT abdomen and pelvis 01/26/2021  CT guided drainage procedure of abdominal wall abscess 01/27/21  Antimicrobials:  Anti-infectives (From admission, onward)   Start     Dose/Rate Route Frequency Ordered Stop   01/23/21 1315  vancomycin (VANCOCIN) 50 mg/mL oral solution 125 mg        125 mg Oral Every 6 hours 01/23/21 1226  01/29/21 2359   01/23/21 1000  fidaxomicin (DIFICID) tablet 200 mg  Status:  Discontinued        200 mg Oral 2 times daily 01/23/21 0859 01/23/21 1226   01/17/21 0600  vancomycin (VANCOREADY) IVPB 1500 mg/300 mL  Status:  Discontinued        1,500 mg 150 mL/hr over 120 Minutes Intravenous Every 24 hours 01/16/21 1732 01/17/21 1216   01/16/21 2200  metroNIDAZOLE (FLAGYL) IVPB 500 mg  Status:  Discontinued        500 mg 100 mL/hr over 60 Minutes Intravenous Every 8 hours 01/16/21 1622 01/17/21 1216   01/16/21 2000  vancomycin (VANCOCIN) 50 mg/mL oral solution 125 mg  Status:  Discontinued        125 mg Oral 4 times daily 01/16/21 1805 01/23/21 0859    01/16/21 1800  ceFEPIme (MAXIPIME) 2 g in sodium chloride 0.9 % 100 mL IVPB  Status:  Discontinued        2 g 200 mL/hr over 30 Minutes Intravenous Every 8 hours 01/16/21 1728 01/17/21 1216   01/16/21 1545  vancomycin (VANCOCIN) 50 mg/mL oral solution 125 mg  Status:  Discontinued        125 mg Oral 4 times daily 01/16/21 1539 01/16/21 1622   01/16/21 1445  metroNIDAZOLE (FLAGYL) IVPB 500 mg        500 mg 100 mL/hr over 60 Minutes Intravenous  Once 01/16/21 1438 01/16/21 1622   01/16/21 1330  vancomycin (VANCOCIN) IVPB 1000 mg/200 mL premix        1,000 mg 200 mL/hr over 60 Minutes Intravenous  Once 01/16/21 1320 01/16/21 1538        Subjective: Patient was seen and examined at bedside and she was extremely nauseous this morning and also continued to complain of significant abdominal pain.  States that she did not feel as well today.  Diarrhea has improved.  Denies any chest pain or shortness of breath.  No other concerns or complaints at this time.  Objective: Vitals:   01/28/21 0949 01/28/21 0953 01/28/21 1410 01/28/21 1415  BP: (!) 154/93  135/84 121/79  Pulse: 88 88 86 85  Resp: 14 14 (!) 6 15  Temp:      TempSrc:      SpO2:  99%  91%  Weight:      Height:        Intake/Output Summary (Last 24 hours) at 01/28/2021 1433 Last data filed at 01/28/2021 7673 Gross per 24 hour  Intake 0 ml  Output 275 ml  Net -275 ml   Filed Weights   01/16/21 1700  Weight: 102.1 kg   Examination: Physical Exam:  Constitutional: WN/WD chronically ill-appearing Caucasian female in mild distress and appears a little uncomfortable and Nauseous and in Pain Eyes: Lids and conjunctivae normal, sclerae anicteric  ENMT: External Ears, Nose appear normal. Grossly normal hearing.  Neck: Appears normal, supple, no cervical masses, normal ROM, no appreciable thyromegaly; No JVD Respiratory: Diminished to auscultation bilaterally with coarse breath sounds, no wheezing, rales, rhonchi or crackles. Normal  respiratory effort and patient is not tachypenic. No accessory muscle use. Unlabored breathing   Cardiovascular: RRR, no murmurs / rubs / gallops. S1 and S2 auscultated. Trace Extremity edema Abdomen: Soft, Tender to palpate, Distended 2/2 to body habitus. Abdominal Drain in place. Bowel sounds positive.  GU: Deferred. Musculoskeletal: No clubbing / cyanosis of digits/nails. No joint deformity upper and lower extremities.  Skin: No rashes, lesions, ulcers on a limited  skin evaluation. No induration; Warm and dry.  Neurologic: CN 2-12 grossly intact with no focal deficits. Romberg sign and cerebellar reflexes not assessed.  Psychiatric: Normal judgment and insight. Alert and oriented x 3. Normal mood and appropriate affect.   Data Reviewed: I have personally reviewed following labs and imaging studies  CBC: Recent Labs  Lab 01/23/21 0537 01/25/21 0542 01/27/21 0459 01/28/21 0453  WBC 9.5 9.3 8.8 10.4  NEUTROABS 4.3 3.5 3.4 4.6  HGB 10.3* 9.8* 10.0* 10.7*  HCT 33.5* 31.8* 31.9* 35.0*  MCV 99.4 99.7 98.8 100.9*  PLT 310 347 392 683*   Basic Metabolic Panel: Recent Labs  Lab 01/23/21 0537 01/24/21 0524 01/25/21 0542 01/26/21 0557 01/27/21 0459 01/28/21 0453  NA 140 138 139 140 140 139  K 4.1 4.0 3.5 3.8 3.9 3.4*  CL 105 103 105 105 105 103  CO2 26 26 28 28 26 26   GLUCOSE 113* 113* 112* 122* 112* 109*  BUN 11 12 11 9 9 10   CREATININE 0.49 0.57 0.41* 0.45 0.57 0.48  CALCIUM 8.5* 8.6* 8.4* 8.6* 8.5* 8.6*  MG 1.9 1.8 1.8 2.0  --  1.9  PHOS 3.2  --  4.0  --   --  3.8   GFR: Estimated Creatinine Clearance: 92.4 mL/min (by C-G formula based on SCr of 0.48 mg/dL). Liver Function Tests: Recent Labs  Lab 01/23/21 0537 01/25/21 0542 01/27/21 0459 01/28/21 0453  AST  --   --  35 32  ALT  --   --  14 18  ALKPHOS  --   --  263* 275*  BILITOT  --   --  0.6 0.7  PROT  --   --  6.4* 6.7  ALBUMIN 2.2* 2.3* 2.2* 2.3*   No results for input(s): LIPASE, AMYLASE in the last 168  hours. No results for input(s): AMMONIA in the last 168 hours. Coagulation Profile: No results for input(s): INR, PROTIME in the last 168 hours. Cardiac Enzymes: No results for input(s): CKTOTAL, CKMB, CKMBINDEX, TROPONINI in the last 168 hours. BNP (last 3 results) No results for input(s): PROBNP in the last 8760 hours. HbA1C: No results for input(s): HGBA1C in the last 72 hours. CBG: No results for input(s): GLUCAP in the last 168 hours. Lipid Profile: No results for input(s): CHOL, HDL, LDLCALC, TRIG, CHOLHDL, LDLDIRECT in the last 72 hours. Thyroid Function Tests: No results for input(s): TSH, T4TOTAL, FREET4, T3FREE, THYROIDAB in the last 72 hours. Anemia Panel: Recent Labs    01/28/21 0453  VITAMINB12 240  FOLATE 15.5  FERRITIN 76  TIBC 300  IRON 27*  RETICCTPCT 3.1   Sepsis Labs: No results for input(s): PROCALCITON, LATICACIDVEN in the last 168 hours.  Recent Results (from the past 240 hour(s))  Aerobic/Anaerobic Culture (surgical/deep wound)     Status: None (Preliminary result)   Collection Time: 01/27/21  3:47 PM   Specimen: Abscess  Result Value Ref Range Status   Specimen Description   Final    ABSCESS POST CT GUIDED DRAIN Performed at [facility] [address], [place], [place] [address]    Special Requests   Final    NONE Performed at [facility], [place] [address], [place], [place] [address]    Gram Stain   Final    ABUNDANT WBC PRESENT, PREDOMINANTLY PMN NO ORGANISMS SEEN Performed at [facility], [place] [address] [place], [place] [address]    Culture FEW KLEBSIELLA PNEUMONIAE  Final   Report  Status PENDING  Incomplete     RN Pressure Injury Documentation:     Estimated body mass index is 36.32 kg/m as calculated from the following:   Height as of this encounter: 5\' 6"  (1.676 m).   Weight as of this encounter: 102.1 kg.  Malnutrition Type:  Nutrition Problem: Increased nutrient needs Etiology:  cancer and cancer related treatments   Malnutrition Characteristics:  Signs/Symptoms: estimated needs   Nutrition Interventions:  Interventions: Juven,MVI,Premier Protein   Radiology Studies: CT ABDOMEN PELVIS W CONTRAST  Result Date: 01/26/2021 CLINICAL DATA:  Abdominal pain for the past few months. History of surgery in February. EXAM: CT ABDOMEN AND PELVIS WITH CONTRAST TECHNIQUE: Multidetector CT imaging of the abdomen and pelvis was performed using the standard protocol following bolus administration of intravenous contrast. CONTRAST:  141mL OMNIPAQUE IOHEXOL 300 MG/ML  SOLN COMPARISON:  01/16/2021 FINDINGS: Lower chest: The lung bases are clear of an acute process. No pleural effusions. The heart is within normal limits in size. No pericardial effusion. Hepatobiliary: No hepatic lesions or intrahepatic biliary dilatation. The gallbladder is surgically absent. Stable mild associated common bile duct dilatation. Pancreas: No mass, inflammation or ductal dilatation. Spleen: Status post splenectomy. Adrenals/Urinary Tract: Adrenal glands and kidneys are unremarkable and stable. The bladder is grossly normal. Stomach/Bowel: The stomach, duodenum and small bowel are grossly normal. There are extensive surgical changes involving the colon and small bowel. Persistent inflammatory type changes involving the colon without obvious mass or obstruction. Slightly thickened small bowel loops in the anterior abdomen/pelvis area these bowel loops appear clustered together near some surgical changes and this could be an adhesive process. Is also a small fluid collection and a small amount of gas near the anterior abdominal wall which appears to communicate with a rim enhancing abscess in the anterior abdominal wall. Findings suspicious for a fistula. The anterior abdominal wall abscess is slightly larger when compared to the recent prior CT. It measures approximately 4.4 x 3.4 cm. Stable surgical changes involving  the rectum. Persistent fluid collection in the pelvis just above the bladder anteriorly. This measures approximately 4.5 by 2.5 cm and previously measured 5.0 x 2.6 cm. I do not see any gas in this collection and it could be a liquified hematoma or postoperative seroma. Vascular/Lymphatic: The aorta and branch vessels are patent. 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 This may account for some of the colonic inflammation which could be due to vascular congestion. The portal vein is patent. Reproductive: Surgically absent. Other: Mesenteric edema but no overt ascites. Musculoskeletal: No significant bony findings. IMPRESSION: 1. Persistent inflammatory type changes involving the colon and small bowel. 2. Slight interval increase in size of the anterior abdominal wall abscess which appears to communicate with a smaller intra-abdominal abscess. Findings worrisome for a fistula. 3. Persistent fluid collection in the pelvis just above the bladder anteriorly. This could be a liquified hematoma or postoperative seroma. 4. 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 This may account for some of the colonic inflammation which could be due to vascular congestion. Electronically Signed   By: Marijo Sanes M.D.   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 Please perform CT-guided per drainage catheter for infection source control  purposes. EXAM: CT IMAGE GUIDED FLUID DRAIN BY CATHETER COMPARISON:  CT abdomen pelvis-01/26/2021; 01/16/2021 MEDICATIONS: The patient is currently admitted to the hospital and receiving intravenous antibiotics. The antibiotics were administered within an  appropriate time frame prior to the initiation of the procedure. ANESTHESIA/SEDATION: Moderate (conscious) sedation was employed during this procedure. A total of Versed 2 mg and Fentanyl 100 mcg was administered intravenously. Moderate Sedation Time: 14 minutes. The patient's level of consciousness and vital signs were monitored continuously by radiology nursing throughout the procedure under my direct supervision. CONTRAST:  None COMPLICATIONS: None immediate. PROCEDURE: Informed written consent was obtained from the patient after a discussion of the risks, benefits and alternatives to treatment. The patient was placed supine on the CT gantry and a pre procedural CT was performed re-demonstrating the known abscess/fluid collection within the midline of the abdomen with dominant intraperitoneal component measuring approximately 2.9 x 2.3 cm and subcutaneous component measuring approximately 4.4 x 3.1 cm (image 2, series 3). The procedure was planned. A timeout was performed prior to the initiation of the procedure. The skin overlying the ventral aspect of the mid abdomen was prepped and draped in the usual sterile fashion. The overlying soft tissues were anesthetized with 1% lidocaine with epinephrine. Next, a 72 gauge trocar needle was advanced into the abscess/fluid collection within the peritoneal cavity through the subcutaneous component and a short Amplatz super stiff wire was coiled within the collection. Appropriate positioning was confirmed with a limited CT scan. The tract was serially dilated allowing placement of a 12 [place] all-purpose drainage catheter. Appropriate positioning was confirmed with a limited postprocedural CT scan. Next, approximately 65 ml of amber colored fluid was aspirated. The tube was connected to a drainage bag and sutured in place. Postprocedural imaging was obtained demonstrating near complete resolution of both the intraperitoneal and subcutaneous components of the collection  (series 9). Dressings were applied. The patient tolerated the procedure well without immediate post procedural complication. IMPRESSION: Successful CT guided placement of a 7 French all purpose drain catheter into the midline abdominal abscess with aspiration of 65 mL of purulent fluid. Samples were sent to the laboratory as requested by the ordering clinical team. Electronically Signed   By: Sandi Mariscal M.D.   On: 01/27/2021 16:35   Scheduled Meds: . amLODipine  10 mg Oral Daily  . busPIRone  5 mg Oral Q breakfast   And  . busPIRone  5 mg Oral Q1200   And  . busPIRone  10 mg Oral QHS  . enoxaparin  40 mg Subcutaneous Q24H  . famotidine  40 mg Oral Daily  . fentaNYL  1 patch Transdermal Q72H  . Gerhardt's butt cream   Topical QID  . multivitamin with minerals  1 tablet Oral Daily  . polycarbophil  625 mg Oral Daily  . propranolol  20 mg Oral BID  . saccharomyces boulardii  250 mg Oral BID  . scopolamine  1 patch Transdermal Q72H  . sodium chloride flush  3 mL Intravenous Q12H  . vancomycin  125 mg Oral Q6H   Continuous Infusions:   LOS: 12 days   Kerney Elbe, DO [facility] PAGER is on AMION  If 7PM-7AM, please contact night-coverage [email or web address]

## 2021-01-28 NOTE — Progress Notes (Signed)
[facility] Progress Note     Subjective: CC-  Drain placed in abdominal wall fluid collection yesterday. Output is dark amber with some sediment.  She continues to have some lower abdominal discomfort. Nausea at times, no emesis. Tolerating diet but not eating a lot. Diarrhea has significantly improved, she reports having only 2 loose/somewhat formed stools yesterday.   Objective: Vital signs in last 24 hours: Temp:  [98.2 F (36.8 C)-98.9 F (37.2 C)] 98.9 F (37.2 C) (04/07 0557) Pulse Rate:  [70-85] 85 (04/07 0557) Resp:  [15-17] 16 (04/07 0557) BP: (144-169)/(84-97) 157/86 (04/07 0557) SpO2:  [92 %-99 %] 92 % (04/07 0557) Last BM Date: 01/28/21  Intake/Output from previous day: 04/06 0701 - 04/07 0700 In: 574 [P.O.:574] Out: 76 [Drains:75; Stool:1] Intake/Output this shift: No intake/output data recorded.  PE: Gen: Alter, NAD Resp: rate and effort normal Abd: Midline incision cdi without erythema or drainage- previous opening at distal aspect of wound has closed. IR drain output pictured below/ thicker amber color with sediment     Lab Results:  Recent Labs    01/27/21 0459 01/28/21 0453  WBC 8.8 10.4  HGB 10.0* 10.7*  HCT 31.9* 35.0*  PLT 392 408*   BMET Recent Labs    01/27/21 0459 01/28/21 0453  NA 140 139  K 3.9 3.4*  CL 105 103  CO2 26 26  GLUCOSE 112* 109*  BUN 9 10  CREATININE 0.57 0.48  CALCIUM 8.5* 8.6*   PT/INR No results for input(s): LABPROT, INR in the last 72 hours. CMP     Component Value Date/Time   NA 139 01/28/2021 0453   K 3.4 (L) 01/28/2021 0453   CL 103 01/28/2021 0453   CO2 26 01/28/2021 0453   GLUCOSE 109 (H) 01/28/2021 0453   BUN 10 01/28/2021 0453   CREATININE 0.48 01/28/2021 0453   CREATININE 0.72 10/28/2020 0956   CALCIUM 8.6 (L) 01/28/2021 0453   PROT 6.7 01/28/2021 0453   ALBUMIN 2.3 (L) 01/28/2021 0453   AST 32 01/28/2021 0453   AST 35 10/28/2020 0956   ALT 18 01/28/2021 0453   ALT 28  10/28/2020 0956   ALKPHOS 275 (H) 01/28/2021 0453   BILITOT 0.7 01/28/2021 0453   BILITOT 0.5 10/28/2020 0956   GFRNONAA >60 01/28/2021 0453   GFRNONAA >60 10/28/2020 0956   GFRAA >60 07/27/2020 0922   Lipase     Component Value Date/Time   LIPASE 34 01/16/2021 1043       Studies/Results: CT ABDOMEN PELVIS W CONTRAST  Result Date: 01/26/2021 CLINICAL DATA:  Abdominal pain for the past few months. History of surgery in February. EXAM: CT ABDOMEN AND PELVIS WITH CONTRAST TECHNIQUE: Multidetector CT imaging of the abdomen and pelvis was performed using the standard protocol following bolus administration of intravenous contrast. CONTRAST:  11mL OMNIPAQUE IOHEXOL 300 MG/ML  SOLN COMPARISON:  01/16/2021 FINDINGS: Lower chest: The lung bases are clear of an acute process. No pleural effusions. The heart is within normal limits in size. No pericardial effusion. Hepatobiliary: No hepatic lesions or intrahepatic biliary dilatation. The gallbladder is surgically absent. Stable mild associated common bile duct dilatation. Pancreas: No mass, inflammation or ductal dilatation. Spleen: Status post splenectomy. Adrenals/Urinary Tract: Adrenal glands and kidneys are unremarkable and stable. The bladder is grossly normal. Stomach/Bowel: The stomach, duodenum and small bowel are grossly normal. There are extensive surgical changes involving the colon and small bowel. Persistent inflammatory type changes involving the colon without obvious mass or obstruction. Slightly  thickened small bowel loops in the anterior abdomen/pelvis area these bowel loops appear clustered together near some surgical changes and this could be an adhesive process. Is also a small fluid collection and a small amount of gas near the anterior abdominal wall which appears to communicate with a rim enhancing abscess in the anterior abdominal wall. Findings suspicious for a fistula. The anterior abdominal wall abscess is slightly larger when  compared to the recent prior CT. It measures approximately 4.4 x 3.4 cm. Stable surgical changes involving the rectum. Persistent fluid collection in the pelvis just above the bladder anteriorly. This measures approximately 4.5 by 2.5 cm and previously measured 5.0 x 2.6 cm. I do not see any gas in this collection and it could be a liquified hematoma or postoperative seroma. Vascular/Lymphatic: The aorta and branch vessels are patent. 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 This may account for some of the colonic inflammation which could be due to vascular congestion. The portal vein is patent. Reproductive: Surgically absent. Other: Mesenteric edema but no overt ascites. Musculoskeletal: No significant bony findings. IMPRESSION: 1. Persistent inflammatory type changes involving the colon and small bowel. 2. Slight interval increase in size of the anterior abdominal wall abscess which appears to communicate with a smaller intra-abdominal abscess. Findings worrisome for a fistula. 3. Persistent fluid collection in the pelvis just above the bladder anteriorly. This could be a liquified hematoma or postoperative seroma. 4. 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 This may account for some of the colonic inflammation which could be due to vascular congestion. Electronically Signed   By: Marijo Sanes M.D.   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 Please perform CT-guided per drainage catheter for infection source control purposes. EXAM: CT IMAGE GUIDED FLUID DRAIN BY CATHETER COMPARISON:  CT abdomen pelvis-01/26/2021; 01/16/2021 MEDICATIONS: The patient is currently  admitted to the hospital and receiving intravenous antibiotics. The antibiotics were administered within an appropriate time frame prior to the initiation of the procedure. ANESTHESIA/SEDATION: Moderate (conscious) sedation was employed during this procedure. A total of Versed 2 mg and Fentanyl 100 mcg was administered intravenously. Moderate Sedation Time: 14 minutes. The patient's level of consciousness and vital signs were monitored continuously by radiology nursing throughout the procedure under my direct supervision. CONTRAST:  None COMPLICATIONS: None immediate. PROCEDURE: Informed written consent was obtained from the patient after a discussion of the risks, benefits and alternatives to treatment. The patient was placed supine on the CT gantry and a pre procedural CT was performed re-demonstrating the known abscess/fluid collection within the midline of the abdomen with dominant intraperitoneal component measuring approximately 2.9 x 2.3 cm and subcutaneous component measuring approximately 4.4 x 3.1 cm (image 2, series 3). The procedure was planned. A timeout was performed prior to the initiation of the procedure. The skin overlying the ventral aspect of the mid abdomen was prepped and draped in the usual sterile fashion. The overlying soft tissues were anesthetized with 1% lidocaine with epinephrine. Next, a 60 gauge trocar needle was advanced into the abscess/fluid collection within the peritoneal cavity through the subcutaneous component and a short Amplatz super stiff wire was coiled within the collection. Appropriate positioning was confirmed with a limited CT  scan. The tract was serially dilated allowing placement of a 12 [place] all-purpose drainage catheter. Appropriate positioning was confirmed with a limited postprocedural CT scan. Next, approximately 65 ml of amber colored fluid was aspirated. The tube was connected to a drainage bag and sutured in place. Postprocedural imaging was obtained  demonstrating near complete resolution of both the intraperitoneal and subcutaneous components of the collection (series 9). Dressings were applied. The patient tolerated the procedure well without immediate post procedural complication. IMPRESSION: Successful CT guided placement of a 50 French all purpose drain catheter into the midline abdominal abscess with aspiration of 65 mL of purulent fluid. Samples were sent to the laboratory as requested by the ordering clinical team. Electronically Signed   By: Sandi Mariscal M.D.   On: 01/27/2021 16:35    Anti-infectives: Anti-infectives (From admission, onward)   Start     Dose/Rate Route Frequency Ordered Stop   01/23/21 1315  vancomycin (VANCOCIN) 50 mg/mL oral solution 125 mg        125 mg Oral Every 6 hours 01/23/21 1226 01/29/21 2359   01/23/21 1000  fidaxomicin (DIFICID) tablet 200 mg  Status:  Discontinued        200 mg Oral 2 times daily 01/23/21 0859 01/23/21 1226   01/17/21 0600  vancomycin (VANCOREADY) IVPB 1500 mg/300 mL  Status:  Discontinued        1,500 mg 150 mL/hr over 120 Minutes Intravenous Every 24 hours 01/16/21 1732 01/17/21 1216   01/16/21 2200  metroNIDAZOLE (FLAGYL) IVPB 500 mg  Status:  Discontinued        500 mg 100 mL/hr over 60 Minutes Intravenous Every 8 hours 01/16/21 1622 01/17/21 1216   01/16/21 2000  vancomycin (VANCOCIN) 50 mg/mL oral solution 125 mg  Status:  Discontinued        125 mg Oral 4 times daily 01/16/21 1805 01/23/21 0859   01/16/21 1800  ceFEPIme (MAXIPIME) 2 g in sodium chloride 0.9 % 100 mL IVPB  Status:  Discontinued        2 g 200 mL/hr over 30 Minutes Intravenous Every 8 hours 01/16/21 1728 01/17/21 1216   01/16/21 1545  vancomycin (VANCOCIN) 50 mg/mL oral solution 125 mg  Status:  Discontinued        125 mg Oral 4 times daily 01/16/21 1539 01/16/21 1622   01/16/21 1445  metroNIDAZOLE (FLAGYL) IVPB 500 mg        500 mg 100 mL/hr over 60 Minutes Intravenous  Once 01/16/21 1438 01/16/21 1622    01/16/21 1330  vancomycin (VANCOCIN) IVPB 1000 mg/200 mL premix        1,000 mg 200 mL/hr over 60 Minutes Intravenous  Once 01/16/21 1320 01/16/21 1538       Assessment/Plan Hx DVT/PE s/p IVC filter HTN DM Anxiety Autoimmune hepatitis  Metastatic appendiceal cancer, pseudomyxoma peritonei and peritoneal carcinomatosis -S/PCRS/HIPECand splenectomy in 2005 -Recurrent cancer S/PHIPECand debulkingon 12/10/2020 in [place];complicated by enterotomy requiring return to the OR on postop day 4 and then again for closure of open midline wound -Postop abdominal wall fluid collection and deep pelvic collectionon CT - repeat CT scan 4/5 showed slight interval increase in size of the anterior abdominal wall abscess which appears to communicate with a smaller intra-abdominal abscess; findings worrisome for a fistula; persistent fluid collection in the pelvis just above the bladder anteriorly, this could be a liquified hematoma or postoperative seroma - s/p IR drain placement 4/6 in abdominal wall fluid collection, output is not frank stool but  it is dark/thick and has sediment concerning for possible leak >> cx pending: ABUNDANT WBC PRESENT, PREDOMINANTLY PMN, NO ORGANISMS SEEN - If there is a fistula present this is not an acute surgical issue, patient is clinically stable. Recommend continuing drain and monitoring output quality and volume. Follow culture. Once output decreases could ask IR to do drain injection study to evaluate for fistulous connection to bowel. Patient is likely going to need close surgical follow up after discharge, whether this is with her surgeon in [place], a Psychologist, sport and exercise at closer tertiary care center such as [facility] that does the procedure she had, or with one of our [place].  C diff colitis -on PO vancomycin, diarrhea improving  ID -PO vancomycin FEN -HH/CM diet VTE -lovenox Foley -none    LOS: 12 days    Wellington Hampshire, [facility]  Surgery 01/28/2021, 9:19 AM Please see Amion for pager number during day hours 7:00am-4:30pm

## 2021-01-29 DIAGNOSIS — K754 Autoimmune hepatitis: Secondary | ICD-10-CM | POA: Diagnosis not present

## 2021-01-29 DIAGNOSIS — A0472 Enterocolitis due to Clostridium difficile, not specified as recurrent: Secondary | ICD-10-CM | POA: Diagnosis not present

## 2021-01-29 DIAGNOSIS — L02211 Cutaneous abscess of abdominal wall: Secondary | ICD-10-CM | POA: Diagnosis not present

## 2021-01-29 DIAGNOSIS — T8149XA Infection following a procedure, other surgical site, initial encounter: Secondary | ICD-10-CM | POA: Diagnosis not present

## 2021-01-29 LAB — COMPREHENSIVE METABOLIC PANEL
ALT: 16 U/L (ref 0–44)
AST: 30 U/L (ref 15–41)
Albumin: 2.3 g/dL — ABNORMAL LOW (ref 3.5–5.0)
Alkaline Phosphatase: 261 U/L — ABNORMAL HIGH (ref 38–126)
Anion gap: 9 (ref 5–15)
BUN: 10 mg/dL (ref 6–20)
CO2: 24 mmol/L (ref 22–32)
Calcium: 8.9 mg/dL (ref 8.9–10.3)
Chloride: 103 mmol/L (ref 98–111)
Creatinine, Ser: 0.54 mg/dL (ref 0.44–1.00)
GFR, Estimated: >60 mL/min (ref >60)
Glucose, Bld: 109 mg/dL — ABNORMAL HIGH (ref 70–99)
Potassium: 4 mmol/L (ref 3.5–5.1)
Sodium: 136 mmol/L (ref 135–145)
Total Bilirubin: 0.8 mg/dL (ref 0.3–1.2)
Total Protein: 6.5 g/dL (ref 6.5–8.1)

## 2021-01-29 LAB — CBC WITH DIFFERENTIAL/PLATELET
Abs Immature Granulocytes: 0.03 10*3/uL (ref 0.00–0.07)
Basophils Absolute: 0.1 10*3/uL (ref 0.0–0.1)
Basophils Relative: 1 %
Eosinophils Absolute: 0.2 10*3/uL (ref 0.0–0.5)
Eosinophils Relative: 2 %
HCT: 34.1 % — ABNORMAL LOW (ref 36.0–46.0)
Hemoglobin: 10.6 g/dL — ABNORMAL LOW (ref 12.0–15.0)
Immature Granulocytes: 0 %
Lymphocytes Relative: 42 %
Lymphs Abs: 4.5 10*3/uL — ABNORMAL HIGH (ref 0.7–4.0)
MCH: 31.2 pg (ref 26.0–34.0)
MCHC: 31.1 g/dL (ref 30.0–36.0)
MCV: 100.3 fL — ABNORMAL HIGH (ref 80.0–100.0)
Monocytes Absolute: 1.4 10*3/uL — ABNORMAL HIGH (ref 0.1–1.0)
Monocytes Relative: 13 %
Neutro Abs: 4.5 10*3/uL (ref 1.7–7.7)
Neutrophils Relative %: 42 %
Platelets: 383 10*3/uL (ref 150–400)
RBC: 3.4 MIL/uL — ABNORMAL LOW (ref 3.87–5.11)
RDW: 19.2 % — ABNORMAL HIGH (ref 11.5–15.5)
WBC: 10.7 10*3/uL — ABNORMAL HIGH (ref 4.0–10.5)
nRBC: 0 % (ref 0.0–0.2)

## 2021-01-29 LAB — MAGNESIUM: Magnesium: 1.9 mg/dL (ref 1.7–2.4)

## 2021-01-29 LAB — PHOSPHORUS: Phosphorus: 3.4 mg/dL (ref 2.5–4.6)

## 2021-01-29 MED ORDER — CEFDINIR 300 MG PO CAPS
300.0000 mg | ORAL_CAPSULE | Freq: Two times a day (BID) | ORAL | Status: DC
  Administered 2021-01-29 – 2021-02-10 (×25): 300 mg via ORAL
  Filled 2021-01-29 (×25): qty 1

## 2021-01-29 MED ORDER — METRONIDAZOLE 500 MG PO TABS: 500.0000 mg | ORAL_TABLET | Freq: Three times a day (TID) | ORAL | Status: DC

## 2021-01-29 MED ORDER — VANCOMYCIN 50 MG/ML ORAL SOLUTION
125.0000 mg | Freq: Two times a day (BID) | ORAL | Status: DC
  Administered 2021-01-30 – 2021-02-10 (×23): 125 mg via ORAL
  Filled 2021-01-29 (×23): qty 2.5

## 2021-01-29 MED ORDER — METRONIDAZOLE 500 MG PO TABS
500.0000 mg | ORAL_TABLET | Freq: Two times a day (BID) | ORAL | Status: DC
  Administered 2021-01-29 – 2021-02-01 (×6): 500 mg via ORAL
  Filled 2021-01-29 (×6): qty 1

## 2021-01-29 NOTE — Progress Notes (Signed)
[facility] Progress Note     Subjective: CC-  Feeling ok this morning. Diarrhea continues to improve. She reports having only 1 loose stool over night and 1 this morning. Denies n/v. Tolerating diet. IR drain output a little lighter/clearer today.  Objective: Vital signs in last 24 hours: Temp:  [98.8 F (37.1 C)-98.9 F (37.2 C)] 98.9 F (37.2 C) (04/08 0317) Pulse Rate:  [79-92] 79 (04/08 0317) Resp:  [6-18] 18 (04/08 0317) BP: (121-135)/(77-94) 132/77 (04/08 0317) SpO2:  [90 %-91 %] 91 % (04/08 0317) Last BM Date: 01/28/21  Intake/Output from previous day: 04/07 0701 - 04/08 0700 In: 400 [IV Piggyback:400] Out: 300 [Drains:300] Intake/Output this shift: Total I/O In: -  Out: 150 [Drains:150]  PE: Gen: Alert, NAD Resp:rate and effort normal GYI:RSWNIOE incision cdi without erythema or drainage- previous opening at distal aspect of wound has closed. IR drain in place with clear/amber fluid in pouch and some sediment  Lab Results:  Recent Labs    01/28/21 0453 01/29/21 0601  WBC 10.4 10.7*  HGB 10.7* 10.6*  HCT 35.0* 34.1*  PLT 408* 383   BMET Recent Labs    01/28/21 0453 01/29/21 0601  NA 139 136  K 3.4* 4.0  CL 103 103  CO2 26 24  GLUCOSE 109* 109*  BUN 10 10  CREATININE 0.48 0.54  CALCIUM 8.6* 8.9   PT/INR No results for input(s): LABPROT, INR in the last 72 hours. CMP     Component Value Date/Time   NA 136 01/29/2021 0601   K 4.0 01/29/2021 0601   CL 103 01/29/2021 0601   CO2 24 01/29/2021 0601   GLUCOSE 109 (H) 01/29/2021 0601   BUN 10 01/29/2021 0601   CREATININE 0.54 01/29/2021 0601   CREATININE 0.72 10/28/2020 0956   CALCIUM 8.9 01/29/2021 0601   PROT 6.5 01/29/2021 0601   ALBUMIN 2.3 (L) 01/29/2021 0601   AST 30 01/29/2021 0601   AST 35 10/28/2020 0956   ALT 16 01/29/2021 0601   ALT 28 10/28/2020 0956   ALKPHOS 261 (H) 01/29/2021 0601   BILITOT 0.8 01/29/2021 0601   BILITOT 0.5 10/28/2020 0956   GFRNONAA >60  01/29/2021 0601   GFRNONAA >60 10/28/2020 0956   GFRAA >60 07/27/2020 0922   Lipase     Component Value Date/Time   LIPASE 34 01/16/2021 1043       Studies/Results: CT IMAGE GUIDED FLUID DRAIN BY CATHETER  Result Date: 01/27/2021 INDICATION: History of pseudomyxoma peritonei with multiple operative interventions performed at an outside institution, now with draining midline abdominal and concern for intraperitoneal abscess. Please perform CT-guided per drainage catheter for infection source control purposes. EXAM: CT IMAGE GUIDED FLUID DRAIN BY CATHETER COMPARISON:  CT abdomen pelvis-01/26/2021; 01/16/2021 MEDICATIONS: The patient is currently admitted to the hospital and receiving intravenous antibiotics. The antibiotics were administered within an appropriate time frame prior to the initiation of the procedure. ANESTHESIA/SEDATION: Moderate (conscious) sedation was employed during this procedure. A total of Versed 2 mg and Fentanyl 100 mcg was administered intravenously. Moderate Sedation Time: 14 minutes. The patient's level of consciousness and vital signs were monitored continuously by radiology nursing throughout the procedure under my direct supervision. CONTRAST:  None COMPLICATIONS: None immediate. PROCEDURE: Informed written consent was obtained from the patient after a discussion of the risks, benefits and alternatives to treatment. The patient was placed supine on the CT gantry and a pre procedural CT was performed re-demonstrating the known abscess/fluid collection within the midline of the  abdomen with dominant intraperitoneal component measuring approximately 2.9 x 2.3 cm and subcutaneous component measuring approximately 4.4 x 3.1 cm (image 2, series 3). The procedure was planned. A timeout was performed prior to the initiation of the procedure. The skin overlying the ventral aspect of the mid abdomen was prepped and draped in the usual sterile fashion. The overlying soft tissues were  anesthetized with 1% lidocaine with epinephrine. Next, a 61 gauge trocar needle was advanced into the abscess/fluid collection within the peritoneal cavity through the subcutaneous component and a short Amplatz super stiff wire was coiled within the collection. Appropriate positioning was confirmed with a limited CT scan. The tract was serially dilated allowing placement of a 12 [place] all-purpose drainage catheter. Appropriate positioning was confirmed with a limited postprocedural CT scan. Next, approximately 65 ml of amber colored fluid was aspirated. The tube was connected to a drainage bag and sutured in place. Postprocedural imaging was obtained demonstrating near complete resolution of both the intraperitoneal and subcutaneous components of the collection (series 9). Dressings were applied. The patient tolerated the procedure well without immediate post procedural complication. IMPRESSION: Successful CT guided placement of a 38 French all purpose drain catheter into the midline abdominal abscess with aspiration of 65 mL of purulent fluid. Samples were sent to the laboratory as requested by the ordering clinical team. Electronically Signed   By: Sandi Mariscal M.D.   On: 01/27/2021 16:35    Anti-infectives: Anti-infectives (From admission, onward)   Start     Dose/Rate Route Frequency Ordered Stop   01/28/21 1800  metroNIDAZOLE (FLAGYL) IVPB 500 mg        500 mg 100 mL/hr over 60 Minutes Intravenous Every 8 hours 01/28/21 1517     01/28/21 1600  ciprofloxacin (CIPRO) IVPB 400 mg        400 mg 200 mL/hr over 60 Minutes Intravenous Every 12 hours 01/28/21 1517     01/23/21 1315  vancomycin (VANCOCIN) 50 mg/mL oral solution 125 mg        125 mg Oral Every 6 hours 01/23/21 1226 01/29/21 2359   01/23/21 1000  fidaxomicin (DIFICID) tablet 200 mg  Status:  Discontinued        200 mg Oral 2 times daily 01/23/21 0859 01/23/21 1226   01/17/21 0600  vancomycin (VANCOREADY) IVPB 1500 mg/300 mL  Status:   Discontinued        1,500 mg 150 mL/hr over 120 Minutes Intravenous Every 24 hours 01/16/21 1732 01/17/21 1216   01/16/21 2200  metroNIDAZOLE (FLAGYL) IVPB 500 mg  Status:  Discontinued        500 mg 100 mL/hr over 60 Minutes Intravenous Every 8 hours 01/16/21 1622 01/17/21 1216   01/16/21 2000  vancomycin (VANCOCIN) 50 mg/mL oral solution 125 mg  Status:  Discontinued        125 mg Oral 4 times daily 01/16/21 1805 01/23/21 0859   01/16/21 1800  ceFEPIme (MAXIPIME) 2 g in sodium chloride 0.9 % 100 mL IVPB  Status:  Discontinued        2 g 200 mL/hr over 30 Minutes Intravenous Every 8 hours 01/16/21 1728 01/17/21 1216   01/16/21 1545  vancomycin (VANCOCIN) 50 mg/mL oral solution 125 mg  Status:  Discontinued        125 mg Oral 4 times daily 01/16/21 1539 01/16/21 1622   01/16/21 1445  metroNIDAZOLE (FLAGYL) IVPB 500 mg        500 mg 100 mL/hr over 60 Minutes Intravenous  Once 01/16/21 1438 01/16/21 1622   01/16/21 1330  vancomycin (VANCOCIN) IVPB 1000 mg/200 mL premix        1,000 mg 200 mL/hr over 60 Minutes Intravenous  Once 01/16/21 1320 01/16/21 1538       Assessment/Plan Hx DVT/PE s/p IVC filter HTN DM Anxiety Autoimmune hepatitis  Metastatic appendiceal cancer, pseudomyxoma peritonei and peritoneal carcinomatosis -S/PCRS/HIPECand splenectomy in 2005 -Recurrent cancer S/PHIPECand debulkingon 12/10/2020 in [place];complicated by enterotomy requiring return to the OR on postop day 4 and then again for closure of open midline wound -Postop abdominal wall fluid collection and deep pelvic collectionon CT - repeat CT scan 4/5 showed slight interval increase in size of the anterior abdominal wall abscess which appears to communicate with a smaller intra-abdominal abscess; findings worrisome for a fistula; persistent fluid collection in the pelvis just above the bladder anteriorly, this could be a liquified hematoma or postoperative seroma - s/p IR drain placement 4/6 in  abdominal wall fluid collection, output is not frank stool but it was initially dark/thick with sediment concerning for possible leak >> cx pending: FEW KLEBSIELLA PNEUMONIAE   C diff colitis -on PO vancomycin, diarrhea improving  ID -PO vancomycin, cipro/flagyl 4/7>> FEN -HH/CM diet VTE -lovenox Foley -none   Plan: Continue IR drain and monitor output. Continue antibiotics and follow culture. Once medically stable for discharge she could go home with the drain and IR follow up to plan for drain injection study in the future. She plans to follow up with her surgeon in [place].   LOS: 13 days    [facility] 01/29/2021, 10:44 AM Please see Amion for pager number during day hours 7:00am-4:30pm

## 2021-01-29 NOTE — Progress Notes (Addendum)
HEMATOLOGY-ONCOLOGY PROGRESS NOTE  SUBJECTIVE: Diarrhea has improved.  She continues to have abdominal discomfort near the drain site.  She remains on a fentanyl patch and as needed Dilaudid-IV alternating with p.o.  Patient received a dose of Feraheme on 4/7 and tolerated it well.  Afebrile.  REVIEW OF SYSTEMS:   Constitutional: Denies fevers, chills  Eyes: Denies blurriness of vision Ears, nose, mouth, throat, and face: Denies mucositis or sore throat Respiratory: Denies cough, dyspnea or wheezes Cardiovascular: Denies palpitation, chest discomfort Gastrointestinal: Reports abdominal discomfort near the drain site Skin: Denies abnormal skin rashes Lymphatics: Denies new lymphadenopathy or easy bruising Neurological:Denies numbness, tingling or new weaknesses Behavioral/Psych: Mood is stable, no new changes  Extremities: No lower extremity edema All other systems were reviewed with the patient and are negative.  I have reviewed the past medical history, past surgical history, social history and family history with the patient and they are unchanged from previous note.   PHYSICAL EXAMINATION: ECOG PERFORMANCE STATUS: 1 - Symptomatic but completely ambulatory  Vitals:   01/28/21 2023 01/29/21 0317  BP: (!) 133/94 132/77  Pulse: 92 79  Resp: 18 18  Temp: 98.8 F (37.1 C) 98.9 F (37.2 C)  SpO2: 90% 91%   Filed Weights   01/16/21 1700  Weight: 102.1 kg    Intake/Output from previous day: 04/07 0701 - 04/08 0700 In: 400 [IV Piggyback:400] Out: 300 [Drains:300]  GENERAL:alert, no distress and comfortable SKIN: skin color, texture, turgor are normal, no rashes or significant lesions LUNGS: clear to auscultation and percussion with normal breathing effort HEART: regular rate & rhythm and no murmurs and no lower extremity edema ABDOMEN: Positive bowel sounds, soft, mild tenderness near the drain Musculoskeletal:no cyanosis of digits and no clubbing  NEURO: alert & oriented x  3 with fluent speech, no focal motor/sensory deficits  LABORATORY DATA:  I have reviewed the data as listed CMP Latest Ref Rng & Units 01/29/2021 01/28/2021 01/27/2021  Glucose 70 - 99 mg/dL 109(H) 109(H) 112(H)  BUN 6 - 20 mg/dL 10 10 9   Creatinine 0.44 - 1.00 mg/dL 0.54 0.48 0.57  Sodium 135 - 145 mmol/L 136 139 140  Potassium 3.5 - 5.1 mmol/L 4.0 3.4(L) 3.9  Chloride 98 - 111 mmol/L 103 103 105  CO2 22 - 32 mmol/L 24 26 26   Calcium 8.9 - 10.3 mg/dL 8.9 8.6(L) 8.5(L)  Total Protein 6.5 - 8.1 g/dL 6.5 6.7 6.4(L)  Total Bilirubin 0.3 - 1.2 mg/dL 0.8 0.7 0.6  Alkaline Phos 38 - 126 U/L 261(H) 275(H) 263(H)  AST 15 - 41 U/L 30 32 35  ALT 0 - 44 U/L 16 18 14     Lab Results  Component Value Date   WBC 10.7 (H) 01/29/2021   HGB 10.6 (L) 01/29/2021   HCT 34.1 (L) 01/29/2021   MCV 100.3 (H) 01/29/2021   PLT 383 01/29/2021   NEUTROABS 4.5 01/29/2021    CT ABDOMEN PELVIS W CONTRAST  Result Date: 01/26/2021 CLINICAL DATA:  Abdominal pain for the past few months. History of surgery in February. EXAM: CT ABDOMEN AND PELVIS WITH CONTRAST TECHNIQUE: Multidetector CT imaging of the abdomen and pelvis was performed using the standard protocol following bolus administration of intravenous contrast. CONTRAST:  152mL OMNIPAQUE IOHEXOL 300 MG/ML  SOLN COMPARISON:  01/16/2021 FINDINGS: Lower chest: The lung bases are clear of an acute process. No pleural effusions. The heart is within normal limits in size. No pericardial effusion. Hepatobiliary: No hepatic lesions or intrahepatic biliary dilatation. The gallbladder  is surgically absent. Stable mild associated common bile duct dilatation. Pancreas: No mass, inflammation or ductal dilatation. Spleen: Status post splenectomy. Adrenals/Urinary Tract: Adrenal glands and kidneys are unremarkable and stable. The bladder is grossly normal. Stomach/Bowel: The stomach, duodenum and small bowel are grossly normal. There are extensive surgical changes involving the colon  and small bowel. Persistent inflammatory type changes involving the colon without obvious mass or obstruction. Slightly thickened small bowel loops in the anterior abdomen/pelvis area these bowel loops appear clustered together near some surgical changes and this could be an adhesive process. Is also a small fluid collection and a small amount of gas near the anterior abdominal wall which appears to communicate with a rim enhancing abscess in the anterior abdominal wall. Findings suspicious for a fistula. The anterior abdominal wall abscess is slightly larger when compared to the recent prior CT. It measures approximately 4.4 x 3.4 cm. Stable surgical changes involving the rectum. Persistent fluid collection in the pelvis just above the bladder anteriorly. This measures approximately 4.5 by 2.5 cm and previously measured 5.0 x 2.6 cm. I do not see any gas in this collection and it could be a liquified hematoma or postoperative seroma. Vascular/Lymphatic: The aorta and branch vessels are patent. 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 This may account for some of the colonic inflammation which could be due to vascular congestion. The portal vein is patent. Reproductive: Surgically absent. Other: Mesenteric edema but no overt ascites. Musculoskeletal: No significant bony findings. IMPRESSION: 1. Persistent inflammatory type changes involving the colon and small bowel. 2. Slight interval increase in size of the anterior abdominal wall abscess which appears to communicate with a smaller intra-abdominal abscess. Findings worrisome for a fistula. 3. Persistent fluid collection in the pelvis just above the bladder anteriorly. This could be a liquified hematoma or postoperative seroma. 4. 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 This may account for some of the colonic inflammation which could be due to vascular  congestion. Electronically Signed   By: Marijo Sanes M.D.   On: 01/26/2021 18:35   CT ABDOMEN PELVIS W CONTRAST  Result Date: 01/16/2021 CLINICAL DATA:  Surgery 5 weeks ago. Nausea, vomiting, and diarrhea for 6 weeks, worsened this morning. Incisional drainage of yellow fluid starting this morning. Appendiceal cancer with pseudomyxoma peritonei, chemotherapy, pulmonary embolism post IVC filter placement, fibromyalgia EXAM: CT ABDOMEN AND PELVIS WITH CONTRAST TECHNIQUE: Multidetector CT imaging of the abdomen and pelvis was performed using the standard protocol following bolus administration of intravenous contrast. Sagittal and coronal MPR images reconstructed from axial data set. CONTRAST:  130mL OMNIPAQUE IOHEXOL 300 MG/ML SOLN IV. No oral contrast administered. COMPARISON:  10/28/2020 FINDINGS: Lower chest: Subsegmental atelectasis versus scarring lateral RIGHT lower lobe unchanged. Small posterior LEFT diaphragmatic hernia containing fat. Hepatobiliary: Nodular contour of the liver without discrete mass. Post cholecystectomy. Pancreas: Atrophic pancreas Spleen: Post splenectomy Adrenals/Urinary Tract: Unremarkable adrenal glands. Kidneys, ureters, and bladder normal appearance Stomach/Bowel: Anastomotic staple line at rectum within additional anastomotic staple lines in the RIGHT pelvis and central mid abdomen. Diffuse colonic wall thickening. Stomach decompressed. Small bowel loops demonstrate mild scattered wall thickening as well. No evidence of obstruction. Vascular/Lymphatic: IVC filter, with penetration of the IVC wall by multiple limbs. Vascular structures patent. No adenopathy. Reproductive: Uterus surgically absent.  Ovaries nonvisualized. Other: Scattered infiltrative changes of mesenteric and abdominal fat which may be related to interval surgery, though  cannot exclude involvement by peritoneal tumor. Gas and fluid collection identified within anterior abdominal wall ventral surgical wound  measuring 3.2 x 1.8 cm in greatest axial dimensions and extending 13 cm in length, concerning for wound infection. Additional loculated fluid collection in the pelvis just superior and anterior to the urinary bladder measuring 5.5 x 2.6 x 3.8 cm, partially enhancing margins, sterile versus infected postoperative collection. Small amount of free air at anterior abdomen extending into the surgical wound. Musculoskeletal: Prior lumbosacral fusion. Scattered degenerative changes of thoracic and lumbar spine. IMPRESSION: Gas and fluid collection within the anterior abdominal wall ventral surgical wound measuring 3.2 x 1.8 x 13 cmconcerning for wound infection. Additional loculated fluid collection in the pelvis just superior and anterior to the urinary bladder measuring 5.5 x 2.6 x 3.8 cm, question sterile versus infected postoperative collection. Scattered infiltrative changes of mesenteric and abdominal fat which may be related to interval surgery, though cannot exclude involvement by peritoneal tumor. Small amount of free air extending into the surgical wound. Diffuse colonic and small bowel wall thickening which may be related to recent abdominal/intestinal surgery though enteritis not excluded Nodular contour of the liver question cirrhosis. Small posterior LEFT diaphragmatic hernia containing fat. Aortic Atherosclerosis (ICD10-I70.0). Electronically Signed   By: Lavonia Dana M.D.   On: 01/16/2021 13:40   CT IMAGE GUIDED FLUID DRAIN BY CATHETER  Result Date: 01/27/2021 INDICATION: History of pseudomyxoma peritonei with multiple operative interventions performed at an outside institution, now with draining midline abdominal and concern for intraperitoneal abscess. Please perform CT-guided per drainage catheter for infection source control purposes. EXAM: CT IMAGE GUIDED FLUID DRAIN BY CATHETER COMPARISON:  CT abdomen pelvis-01/26/2021; 01/16/2021 MEDICATIONS: The patient is currently admitted to the hospital and  receiving intravenous antibiotics. The antibiotics were administered within an appropriate time frame prior to the initiation of the procedure. ANESTHESIA/SEDATION: Moderate (conscious) sedation was employed during this procedure. A total of Versed 2 mg and Fentanyl 100 mcg was administered intravenously. Moderate Sedation Time: 14 minutes. The patient's level of consciousness and vital signs were monitored continuously by radiology nursing throughout the procedure under my direct supervision. CONTRAST:  None COMPLICATIONS: None immediate. PROCEDURE: Informed written consent was obtained from the patient after a discussion of the risks, benefits and alternatives to treatment. The patient was placed supine on the CT gantry and a pre procedural CT was performed re-demonstrating the known abscess/fluid collection within the midline of the abdomen with dominant intraperitoneal component measuring approximately 2.9 x 2.3 cm and subcutaneous component measuring approximately 4.4 x 3.1 cm (image 2, series 3). The procedure was planned. A timeout was performed prior to the initiation of the procedure. The skin overlying the ventral aspect of the mid abdomen was prepped and draped in the usual sterile fashion. The overlying soft tissues were anesthetized with 1% lidocaine with epinephrine. Next, a 44 gauge trocar needle was advanced into the abscess/fluid collection within the peritoneal cavity through the subcutaneous component and a short Amplatz super stiff wire was coiled within the collection. Appropriate positioning was confirmed with a limited CT scan. The tract was serially dilated allowing placement of a 12 [place] all-purpose drainage catheter. Appropriate positioning was confirmed with a limited postprocedural CT scan. Next, approximately 65 ml of amber colored fluid was aspirated. The tube was connected to a drainage bag and sutured in place. Postprocedural imaging was obtained demonstrating near complete  resolution of both the intraperitoneal and subcutaneous components of the collection (series 9). Dressings were applied. The patient  tolerated the procedure well without immediate post procedural complication. IMPRESSION: Successful CT guided placement of a 66 French all purpose drain catheter into the midline abdominal abscess with aspiration of 65 mL of purulent fluid. Samples were sent to the laboratory as requested by the ordering clinical team. Electronically Signed   By: Sandi Mariscal M.D.   On: 01/27/2021 16:35    ASSESSMENT AND PLAN: 1.  Appendiceal cancer status post CRS/HIPEC and splenectomy in 2005 and status post HIPEC and debulking for recurrent cancer on 12/10/2020 in [place] 2.  Postop abdominal wall fluid collection 3.  C. difficile colitis 4.  Anemia 5.  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  Hypertension 7.  Anxiety 8.  Diabetes mellitus 9.  Essential tremor 10.  Autoimmune hepatitis  -The patient is status post drain placement.  She continues on antibiotics.  Cultures negative to date and will continue to follow these.  Appreciate assistance from IR and general surgery. -The patient plans to follow-up with her surgeon in [place] once she is discharged -Diarrhea improving with oral vancomycin.  Management per hospitalist. -Status post Feraheme for iron deficiency anemia.  Hemoglobin stable.  Monitor.   LOS: 13 days   Mikey Bussing, DNP, AGPCNP-BC, AOCNP 01/29/21   ADDENDUM: The fluid that was drained is growing Klebsiella.  She is on ciprofloxacin now.  Surgery has seen her.  It sounds like she will have to go back up to [place] where she actually had her initial surgery and have Dr. Sharon Mt evaluate her.  The diarrhea is better.  Hopefully this will continue to improve.  She should be off the oral vancomycin at this point.  Her labs do not look all that bad.  I would like to hope that she is a little more active.  Possibly, she will be able to go  home over the weekend on oral Cipro.  I am not sure what can happen with the drain that she has in.  Interventional radiology is helping manage this.  She is iron deficient.  Her ferritin was 76 which actually is low given all that she is undergoing right now.  Her iron saturation is only 9% which clearly shows [place] that she is iron deficient.  She should have gotten a dose of Feraheme.  This really has been tough on her.  I know she is trying her best.  I know that the staff up on [address] are doing a fantastic job with her and trying to help her out.  [name], MD  Jude 1:20

## 2021-01-29 NOTE — Progress Notes (Signed)
[place] for Infectious Disease  Date of Admission:  01/16/2021     CC: Diarrhea Hx of peritonitis/surgical wound infection  Lines: Peripheral iv's  Abx: 3/26-c oral vancomycin  3/26-3/27 vanc/cefepime/flagyl  ASSESSMENT: 60 y.o. female with a history of metastatic appendiceal cancer, pseudomyxoma peritonei and peritoneal carcinomatosis, with initial CRS/HIPEC and splenectomy in 2005 [facility]) and recent repeat CRS/HIPEC 12/10/2020, post op course complicated by anastomotic leak/?peritonitis/surgical site infection s/p exlap and I&D 3/04 and abx course, admitted for acute on chronic diarrhea with question of cdiff infection   #cdiff diarrhea Diarrhea appears within days after the 12/10/20 crs/hipec. It got worse with the abx after the exlap on 3/02, and she was receiving lomotil supportive care. Sx was even worse more than 8-10 times a day a week or so before she was admitted 3/26, and associated with new cramping cdiff testing --> Ag eia/toxin pcr positive but actual toxin negative. Previously at [facility] cdiff toxin pcr was negative  Diarrhea improving to 2-4 times a day on po vanc and other supportive measure such as lomotil.  At risk for recurrent now she has to use systemic abx for abd abscess. Other risk include cancer, frequent hospital admission. Will have her on secondary prophylactic po vanc while on systemic abx   -finish treatment dose PO vanc qid on 4/08; transition to 50 mg po bid for the duration of systemic antibiotics plus 1 week beyond -continue supportive care    #hx anastomotic leak/surgical site infection abd wound #intraabdominal and abd wall abscess with probable fistula She is s/p exlap and anastomotic leak repair 3/02 and had a course of Abx at [facility], finishing with moxifloxacin on 3/16  On arrival to [place] was not getting any treatment. Initial ct scan showed abd fluid collection but presumed sterile. On  4/05 repeat ct showed increased size; drain placed 4/06 into abd wall fluid collection and cx grew kleb pna. Obviously proven infection  Previous superficial swab of wound dehiscence acinetobacter/candida, but not present in the abd wall abscess from 4/06 drain cx  Will treat for GI spectrum bacteria Given size of abscess can do PO as there is a drain now also  -transition to oral metronidazole 500 mg po bid and cefdinir 300 mg po bid -duration plan 6 weeks until 5/20 -please order repeat ct abd pelv with contrast in 2-3 weeks -id clinic follow up as above -ok to discharge patient if no other issue, per primary attending   Clinic Follow Up Appt: April 28 @ 330 with Dr Linus Salmons  @  [facility] [id number], [place], [place] [address] Phone: [phone]        I spent more than 35 minute reviewing data/chart, and coordinating care and >50% direct face to face time providing counseling/discussing diagnostics/treatment plan with patient   ----------------------------------- Principal Problem:   Surgical wound infection Active Problems:   Autoimmune hepatitis ([place])   DM2 (diabetes mellitus, type 2) ([place])   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[place])   DVT of deep femoral vein, left ([place])   Pulmonary embolism, bilateral ([facility])   Presence of IVC filter   C. difficile colitis   Abscess of abdominal wall   Hypokalemia   Allergies  Allergen Reactions  . Penicillins Shortness Of Breath    Other reaction(s): Irregular Heart Rate, Other (See Comments) Rapid heartrate   . Alprazolam Hives and Other (See Comments)    Note:  tolerates midazolam fine unresponsive Hard to arouse unresponsive Hard to arouse  Other reaction(s): Other (See Comments) unresponsive  . Ativan [Lorazepam] Other (See Comments)    Note: tolerates midazolam fine Face & Throat Swelling.  . Corticosteroids Other (See Comments)    Other reaction(s): Other (see  comments) Psychotic behaviour    . Erythromycin     Other reaction(s): Other (See Comments) Severe stomach pain   . Erythromycin Base Hives  . Milnacipran Hcl Other (See Comments)    Other reaction(s):  psychotic episode     . Prednisone Other (See Comments)    Anxiety & Nervous Breakdown.  Ocie Cornfield  [Milnacipran]   . Povidone Iodine Rash    Had rash under bandage after surgery , has had betadine since without reaction   . Prednisolone Anxiety    Scheduled Meds: . amLODipine  10 mg Oral Daily  . busPIRone  5 mg Oral Q breakfast   And  . busPIRone  5 mg Oral Q1200   And  . busPIRone  10 mg Oral QHS  . cefdinir  300 mg Oral Q12H  . enoxaparin  40 mg Subcutaneous Q24H  . famotidine  40 mg Oral Daily  . fentaNYL  1 patch Transdermal Q72H  . Gerhardt's butt cream   Topical QID  . metroNIDAZOLE  500 mg Oral Q8H  . multivitamin with minerals  1 tablet Oral Daily  . polycarbophil  625 mg Oral Daily  . propranolol  20 mg Oral BID  . saccharomyces boulardii  250 mg Oral BID  . scopolamine  1 patch Transdermal Q72H  . sodium chloride flush  3 mL Intravenous Q12H  . vancomycin  125 mg Oral Q6H  . [START ON 01/30/2021] vancomycin  125 mg Oral Q12H   Continuous Infusions: PRN Meds:.acetaminophen, calcium carbonate, diclofenac Sodium, diphenoxylate-atropine, hydrALAZINE, HYDROmorphone (DILAUDID) injection, HYDROmorphone, ondansetron **OR** ondansetron (ZOFRAN) IV, simethicone   SUBJECTIVE: Called to reevaluate patient due to repeat ct abd showing increased abd wall abscess size on 4/05; s/p perc drain placement 4/06; cx kleb pna  No f/c. Mild leukocytosis improving Diarrhea down to 2-4 a day, still on vanc po  The last 2 days doesn't feel appetite is good and mild increased abd pain. abx was added for kleb pna yesterday  Her wound incision had closed  Review of Systems: ROS All other ROS was negative, except mentioned above     OBJECTIVE: Vitals:   01/28/21 1410  01/28/21 1415 01/28/21 2023 01/29/21 0317  BP: 135/84 121/79 (!) 133/94 132/77  Pulse: 86 85 92 79  Resp: (!) 6 15 18 18   Temp:   98.8 F (37.1 C) 98.9 F (37.2 C)  TempSrc:   Oral Oral  SpO2:  91% 90% 91%  Weight:      Height:       Body mass index is 36.32 kg/m.  Physical Exam General/constitutional: no distress, pleasant, well developed. Nontoxic appearing HEENT: Normocephalic, PER, Conj Clear, EOMI, Oropharynx clear Neck supple CV: rrr no mrg Lungs: clear to auscultation, normal respiratory effort Abd: Soft, mildly tender; no rebound; there is a drain in lower abdomen draining purulent/green liquid Ext: no edema Skin: No Rash Neuro: nonfocal MSK: no peripheral joint swelling/tenderness/warmth; back spines nontender    Lab Results Lab Results  Component Value Date   WBC 10.7 (H) 01/29/2021   HGB 10.6 (L) 01/29/2021   HCT 34.1 (L) 01/29/2021   MCV 100.3 (H) 01/29/2021   PLT 383 01/29/2021    Lab Results  Component Value Date   CREATININE 0.54 01/29/2021   BUN 10 01/29/2021   NA 136 01/29/2021   K 4.0 01/29/2021   CL 103 01/29/2021   CO2 24 01/29/2021    Lab Results  Component Value Date   ALT 16 01/29/2021   AST 30 01/29/2021   ALKPHOS 261 (H) 01/29/2021   BILITOT 0.8 01/29/2021      Microbiology: Recent Results (from the past 240 hour(s))  Aerobic/Anaerobic Culture (surgical/deep wound)     Status: None (Preliminary result)   Collection Time: 01/27/21  3:47 PM   Specimen: Abscess  Result Value Ref Range Status   Specimen Description ABSCESS POST CT GUIDED DRAIN  Final   Special Requests NONE  Final   Gram Stain   Final    ABUNDANT WBC PRESENT, PREDOMINANTLY PMN NO ORGANISMS SEEN    Culture FEW KLEBSIELLA PNEUMONIAE  Final   Report Status PENDING  Incomplete   Organism ID, Bacteria KLEBSIELLA PNEUMONIAE  Final      Susceptibility   Klebsiella pneumoniae - MIC*    AMPICILLIN >=32 RESISTANT Resistant     CEFAZOLIN <=4 SENSITIVE Sensitive      CEFEPIME <=0.12 SENSITIVE Sensitive     CEFTAZIDIME <=1 SENSITIVE Sensitive     CEFTRIAXONE <=0.25 SENSITIVE Sensitive     CIPROFLOXACIN <=0.25 SENSITIVE Sensitive     GENTAMICIN <=1 SENSITIVE Sensitive     IMIPENEM <=0.25 SENSITIVE Sensitive     TRIMETH/SULFA <=20 SENSITIVE Sensitive     AMPICILLIN/SULBACTAM 4 SENSITIVE Sensitive     PIP/TAZO Value in next row Sensitive      <=4 SENSITIVEPerformed at [facility], [address], [place], [place] [address]    * FEW KLEBSIELLA PNEUMONIAE     Serology: 3/26 gi pcr negative; cdiff pcr  Positive/eia positive, toxin negative  Imaging: If present, new imagings (plain films, ct scans, and mri) have been personally visualized and interpreted; radiology reports have been reviewed. Decision making incorporated into the Impression / Recommendations.  4/05 abd/pelv ct with contrast 1. Persistent inflammatory type changes involving the colon and small bowel. 2. Slight interval increase in size of the anterior abdominal wall abscess which appears to communicate with a smaller intra-abdominal abscess. Findings worrisome for a fistula. 3. Persistent fluid collection in the pelvis just above the bladder anteriorly. This could be a liquified hematoma or postoperative seroma. 4. 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 This may account for some of the colonic inflammation which could be due to vascular congestion.  3/26 abd pelv ct with contrast Gas and fluid collection within the anterior abdominal wall ventral surgical wound measuring 3.2 x 1.8 x 13 cmconcerning for wound infection.  Additional loculated fluid collection in the pelvis just superior and anterior to the urinary bladder measuring 5.5 x 2.6 x 3.8 cm, question sterile versus infected postoperative collection.  Scattered infiltrative changes of mesenteric and abdominal fat which may be related to interval surgery, though cannot  exclude involvement by peritoneal tumor.  Small amount of free air extending into the surgical wound.  Diffuse colonic and small bowel wall thickening which may be related to recent abdominal/intestinal surgery though enteritis not excluded  Nodular contour of the liver question cirrhosis.  Small posterior LEFT diaphragmatic hernia containing fat.  Aortic Atherosclerosis  Jabier Mutton, [place] for Infectious Disease [place] [phone] pager    01/29/2021, 4:51 PM

## 2021-01-29 NOTE — Progress Notes (Signed)
Referring Physician(s): Ennever,P  Supervising Physician: Ruthann Cancer  Patient Status:  [facility] - In-pt  Chief Complaint:  Abdominal pain, nausea, loose stools, abdominal fluid collection  Subjective: Patient doing fair; still has some mid abd discomfort; has had 2 loose stools since yesterday; also with some intermittent nausea; complaining of some discomfort at left arm IV site-nurse aware   Allergies: Penicillins, Alprazolam, Ativan [lorazepam], Corticosteroids, Erythromycin, Erythromycin base, Milnacipran hcl, Prednisone, Savella  [milnacipran], Povidone iodine, and Prednisolone  Medications: Prior to Admission medications   Medication Sig Start Date End Date Taking? Authorizing Provider  busPIRone (BUSPAR) 5 MG tablet Take 5 mg by mouth Barber admin instructions. Takes 1 tablet in the morning, 1 tablet in the afternoon and 2 tablets at night   Yes [provider]  diphenoxylate-atropine (LOMOTIL) 2.5-0.025 MG tablet Take 1 tablet by mouth daily as needed for diarrhea or loose stools. 01/15/21  Yes [provider]  enoxaparin (LOVENOX) 40 MG/0.4ML injection Inject 40 mg into the skin daily. 01/01/21  Yes [provider]  HYDROmorphone (DILAUDID) 2 MG tablet Take 2 mg by mouth every 4 (four) hours as needed for moderate pain. 01/08/21  Yes [provider]  loperamide (IMODIUM) 2 MG capsule Take 4 mg by mouth in the morning, at noon, in the evening, and at bedtime. 01/01/21  Yes [provider]  ondansetron (ZOFRAN) 8 MG tablet Take 8 mg by mouth every 8 (eight) hours as needed for nausea or vomiting. 02/26/20  Yes [provider]  pantoprazole (PROTONIX) 40 MG tablet Take 40 mg by mouth every morning. 01/01/21  Yes [provider]  diazepam (VALIUM) 5 MG tablet Take 40 minutes prior to MRI Patient not taking: No sig reported 07/16/20   Volanda Napoleon, MD  insulin glargine (LANTUS) 100 UNIT/ML injection Inject 0.1 mLs (10 Units  total) into the skin at bedtime. Before bed Patient not taking: No sig reported 11/26/19   Nita Sells, MD  insulin lispro (HUMALOG) 100 UNIT/ML injection Inject 0.08 mLs (8 Units total) into the skin 3 (three) times daily before meals. Patient not taking: No sig reported 11/26/19   Nita Sells, MD     Vital Signs: BP 132/77 (BP Location: Right Arm)   Pulse 79   Temp 98.9 F (37.2 C) (Oral)   Resp 18   Ht 5\' 6"  (1.676 m)   Wt 225 lb (102.1 kg)   SpO2 91%   BMI 36.32 kg/m   Physical Exam awake/alert; lower midline abd drain intact, dressing clean and dry, site mildly tender, OP 300 cc yesterday, 15 cc today yellow fluid with some tissue fragments; drain flushed with minimal return  Imaging: CT ABDOMEN PELVIS W CONTRAST  Result Date: 01/26/2021 CLINICAL DATA:  Abdominal pain for the past few months. History of surgery in February. EXAM: CT ABDOMEN AND PELVIS WITH CONTRAST TECHNIQUE: Multidetector CT imaging of the abdomen and pelvis was performed using the standard protocol following bolus administration of intravenous contrast. CONTRAST:  144mL OMNIPAQUE IOHEXOL 300 MG/ML  SOLN COMPARISON:  01/16/2021 FINDINGS: Lower chest: The lung bases are clear of an acute process. No pleural effusions. The heart is within normal limits in size. No pericardial effusion. Hepatobiliary: No hepatic lesions or intrahepatic biliary dilatation. The gallbladder is surgically absent. Stable mild associated common bile duct dilatation. Pancreas: No mass, inflammation or ductal dilatation. Spleen: Status post splenectomy. Adrenals/Urinary Tract: Adrenal glands and kidneys are unremarkable and stable. The bladder is grossly normal. Stomach/Bowel: The stomach, duodenum  and small bowel are grossly normal. There are extensive surgical changes involving the colon and small bowel. Persistent inflammatory type changes involving the colon without obvious mass or obstruction. Slightly thickened small bowel loops  in the anterior abdomen/pelvis area these bowel loops appear clustered together near some surgical changes and this could be an adhesive process. Is also a small fluid collection and a small amount of gas near the anterior abdominal wall which appears to communicate with a rim enhancing abscess in the anterior abdominal wall. Findings suspicious for a fistula. The anterior abdominal wall abscess is slightly larger when compared to the recent prior CT. It measures approximately 4.4 x 3.4 cm. Stable surgical changes involving the rectum. Persistent fluid collection in the pelvis just above the bladder anteriorly. This measures approximately 4.5 by 2.5 cm and previously measured 5.0 x 2.6 cm. I do not Barber any gas in this collection and it could be a liquified hematoma or postoperative seroma. Vascular/Lymphatic: The aorta and branch vessels are patent. 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 This may account for some of the colonic inflammation which could be due to vascular congestion. The portal vein is patent. Reproductive: Surgically absent. Other: Mesenteric edema but no overt ascites. Musculoskeletal: No significant bony findings. IMPRESSION: 1. Persistent inflammatory type changes involving the colon and small bowel. 2. Slight interval increase in size of the anterior abdominal wall abscess which appears to communicate with a smaller intra-abdominal abscess. Findings worrisome for a fistula. 3. Persistent fluid collection in the pelvis just above the bladder anteriorly. This could be a liquified hematoma or postoperative seroma. 4. 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 This may account for some of the colonic inflammation which could be due to vascular congestion. Electronically Signed   By: Marijo Sanes M.D.   On: 01/26/2021 18:35   CT IMAGE GUIDED FLUID DRAIN BY CATHETER  Result Date:  01/27/2021 INDICATION: History of pseudomyxoma peritonei with multiple operative interventions performed at an outside institution, now with draining midline abdominal and concern for intraperitoneal abscess. Please perform CT-guided per drainage catheter for infection source control purposes. EXAM: CT IMAGE GUIDED FLUID DRAIN BY CATHETER COMPARISON:  CT abdomen pelvis-01/26/2021; 01/16/2021 MEDICATIONS: The patient is currently admitted to the hospital and receiving intravenous antibiotics. The antibiotics were administered within an appropriate time frame prior to the initiation of the procedure. ANESTHESIA/SEDATION: Moderate (conscious) sedation was employed during this procedure. A total of Versed 2 mg and Fentanyl 100 mcg was administered intravenously. Moderate Sedation Time: 14 minutes. The patient's level of consciousness and vital signs were monitored continuously by radiology nursing throughout the procedure under my direct supervision. CONTRAST:  None COMPLICATIONS: None immediate. PROCEDURE: Informed written consent was obtained from the patient after a discussion of the risks, benefits and alternatives to treatment. The patient was placed supine on the CT gantry and a pre procedural CT was performed re-demonstrating the known abscess/fluid collection within the midline of the abdomen with dominant intraperitoneal component measuring approximately 2.9 x 2.3 cm and subcutaneous component measuring approximately 4.4 x 3.1 cm (image 2, series 3). The procedure was planned. A timeout was performed prior to the initiation of the procedure. The skin overlying the ventral aspect of the mid abdomen was prepped and draped in the usual sterile fashion. The overlying soft tissues were anesthetized with 1% lidocaine with epinephrine. Next, a 18 gauge trocar needle was advanced into the  abscess/fluid collection within the peritoneal cavity through the subcutaneous component and a short Amplatz super stiff wire was  coiled within the collection. Appropriate positioning was confirmed with a limited CT scan. The tract was serially dilated allowing placement of a 12 [place] all-purpose drainage catheter. Appropriate positioning was confirmed with a limited postprocedural CT scan. Next, approximately 65 ml of amber colored fluid was aspirated. The tube was connected to a drainage bag and sutured in place. Postprocedural imaging was obtained demonstrating near complete resolution of both the intraperitoneal and subcutaneous components of the collection (series 9). Dressings were applied. The patient tolerated the procedure well without immediate post procedural complication. IMPRESSION: Successful CT guided placement of a 43 French all purpose drain catheter into the midline abdominal abscess with aspiration of 65 mL of purulent fluid. Samples were sent to the laboratory as requested by the ordering clinical team. Electronically Signed   By: Sandi Mariscal M.D.   On: 01/27/2021 16:35    Labs:  CBC: Recent Labs    01/25/21 0542 01/27/21 0459 01/28/21 0453 01/29/21 0601  WBC 9.3 8.8 10.4 10.7*  HGB 9.8* 10.0* 10.7* 10.6*  HCT 31.8* 31.9* 35.0* 34.1*  PLT 347 392 408* 383    COAGS: No results for input(s): INR, APTT in the last 8760 hours.  BMP: Recent Labs    03/19/20 1109 05/01/20 1156 07/27/20 0922 10/28/20 0956 01/26/21 0557 01/27/21 0459 01/28/21 0453 01/29/21 0601  NA 143 141 143   < > 140 140 139 136  K 3.6 3.7 4.1   < > 3.8 3.9 3.4* 4.0  CL 105 104 103   < > 105 105 103 103  CO2 30 27 33*   < > 28 26 26 24   GLUCOSE 133* 102* 160*   < > 122* 112* 109* 109*  BUN 16 21* 19   < > 9 9 10 10   CALCIUM 10.2 10.5* 10.4*   < > 8.6* 8.5* 8.6* 8.9  CREATININE 0.78 1.05* 0.83   < > 0.45 0.57 0.48 0.54  GFRNONAA >60 59* >60   < > >60 >60 >60 >60  GFRAA >60 >60 >60  --   --   --   --   --    < > = values in this interval not displayed.    LIVER FUNCTION TESTS: Recent Labs    01/20/21 0610  01/23/21 0537 01/25/21 0542 01/27/21 0459 01/28/21 0453 01/29/21 0601  BILITOT 0.5  --   --  0.6 0.7 0.8  AST 26  --   --  35 32 30  ALT 12  --   --  14 18 16   ALKPHOS 163*  --   --  263* 275* 261*  PROT 6.0*  --   --  6.4* 6.7 6.5  ALBUMIN 2.2*   < > 2.3* 2.2* 2.3* 2.3*   < > = values in this interval not displayed.    Assessment and Plan: Patient with history of metastatic appendiceal cancer, pseudomyxoma peritonei and peritoneal carcinomatosis, status post CRS/HI PEC and splenectomy in 2005, recurrent cancer status post HI PEC and debulking on 12/10/2020 in [place]; complicated by enterotomy requiring return to the OR on postop day 4 and then again for closure of open midline wound; now with postop abdominal wall fluid collection and deep pelvic collection on CT; status post drainage of midline abdominal fluid collection on 4/6; afebrile, WBC 10.7, hemoglobin 10.6, creatinine normal, drain fluid cultures growing few Klebsiella; continue with drain irrigation,  output/lab monitoring; once output minimal obtain follow-up CT scan/will likely need drain injection prior to removal   Electronically Signed: D. Rowe Robert, PA-C 01/29/2021, 11:29 AM   I spent a total of 15 minutes at the the patient's bedside AND on the patient's hospital floor or unit, greater than 50% of which was counseling/coordinating care for mid abdominal abscess drain    Patient ID: Wendy Barber, female   DOB: January 18, 1961, 60 y.o.   MRN: [id number]

## 2021-01-29 NOTE — Progress Notes (Signed)
PROGRESS NOTE    Dondrea Clendenin  RDE:[id number] DOB: 02-13-1961 DOA: 01/16/2021 PCP: Glendon Axe, MD  Brief Narrative:  The patient is a 60 year old Caucasian female with a past medical history significant for but not limited to metastatic appendiceal cancer, pseudomyxoma peritonei and peritoneal carcinomatosis status post CRS/HIPEC and splenectomy in 2005 and recent CRS/HIPEC on 12/10/2020 for recurrent cancer, autoimmune hepatitis, DVT and PE with IVC filter currently on Lovenox, hypertension, diabetes mellitus type 2 as well as other comorbidities who presented to the ED with multiple complaints including intractable diarrhea since her recent procedure and fatigue as well as wound drainage.  Since her last hospitalization February she has had persistent diarrhea but has negative for C. difficile started on Imodium and Lomotil with some improvement but recently seen by her oncologist in [place] on 316 and states that she is started on antibiotics at that time.  Since then her diarrhea has worsened and she is more fatigued and has had a decreased appetite with increased abdominal pain.  She went to the bathroom and had an area of open incision with significant amount of purulent foul-smelling drainage.  Was admitted for C. difficile colitis and for concern for postop wound infection.  Surgery was initially evaluated and signed off the case however medical oncology obtained a CT scan of the abdomen pelvis yesterday which showed that her abdominal abscess appears to be little bit larger and that she may have a fistula so IR and General Surgery were reconsulted.   Interventional radiology placed a 10 French drain in her abscess on 01/27/21 and it yielded 65 cc of amber-colored fluid.  Her drain was connected to gravity bag and was sent off for culture and is growing out Klebsiella pneumoniae so ID is recommending Cipro/Flagyl given her PCN Allergy.  General Surgery was reconsulted and they evaluated the  patient and feel that her fistula is related to her surgery in [place] in February.  Dr. Harlow Asa evaluated recommends continuing the drain and having the patient follow-up in the IR drain clinic in 10 to 14 days and may need a drain injection to evaluate for a fistula.  Interventional radiology recommends continuing current drain management with every shift flushes and monitoring of output and planning repeat CT scan and possible drain injection when output is less than 10 cc a day to assess for possible removal.  Patient's pain seems controlled today but she still felt a little nauseous.  Diarrhea has improved and she is completed her antibiotics for C. difficile however has been started on IV ciprofloxacin and IV Flagyl for the Klebsiella growing from her abdominal abscess and white count slowly trended up.  Oncology continues her on a fentanyl patch and alternating her on p.o. Dilaudid and IV.  She also received a dose of Feraheme on 01/28/21.  General surgery recommending continuing drain management and continue to monitor output and patient is to be arranged follow-up with the IR drain clinic and follow-up with her surgeon in [place]:   Principal Problem:   Surgical wound infection Active Problems:   Autoimmune hepatitis ([place])   DM2 (diabetes mellitus, type 2) ([place])   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[place])   DVT of deep femoral vein, left ([place])   Pulmonary embolism, bilateral ([facility])   Presence of IVC filter   C. difficile colitis   Abscess of abdominal wall   Hypokalemia  Abdominal postsurgical Wound Infection and Associated Abdominal Abscess status post midline lower abdominal drain  placement by interventional radiology on 01/27/2021 -Patient presented with concerns for abdominal postsurgical wound infection. -Patient afebrile on room air, no leukocytosis. -Status post cytoreductive surgery/HI PEC 12/10/2020 at [facility] in [place],  [place] with major reduction of small bowel, proximal jejunum, ascending colon, partial cystectomy. -Postop course for anastomotic leak and taken back to the OR on POD #13 with closure of abdominal wound on POD #16. -Status post JP drain recently removed 01/06/2021. -Initial CT abdomen and pelvis done concerning for anterior abdominal wall ventral surgical wound collection three, 3.2 x 1.8 x 13 cm concerning for infection as well as additional loculated fluid collection in the pelvis superior and anterior to the urinary bladder measuring 5.5 x 2.6 x 3.8 cm. -Asplenic. -General surgery following and suspected superficial wound seroma in abdominal wall, recommend wound packing daily, suspect fluid collection is sterile fluid collection is sterile. -Initial Wound cultures with few actinobacter species, rare Candida albicans. -Discussed with general surgery about wound culture results and they Did not feel she an active infection at this time but now she is growing Klebsiella from Drain Abscess  -Continued to Monitor leukocytosis and is slowly trending up and if worsening may need to add fluconazole but will defer to ID; ID recommending Cipro/Flagyl given Klebsiella from Drain Cx -Pain management was adjusted by medical oncology and may need to adjust given that nursing staff feels the patient is is over utilizing the pain control -Saline lock IV fluids.   -WBC has trended up from 8.8 -> 10.4 -> 10.7 -Repeat CT abdomen and pelvis ordered per hematology/oncology on 01/26/21 for further evaluation of patient's abdominal pain and showed "Persistent inflammatory type changes involving the colon and small bowel. Slight interval increase in size of the anterior abdominal wall abscess which appears to communicate with a smaller intra-abdominal abscess. Findings worrisome for a fistula. Persistent  Fluid collection in the pelvis just above the bladder anteriorly. This could be a liquified hematoma or postoperative  seroma. 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 This may account for some of the colonic inflammation which could be due to vascular congestion. -IR also reconsulted and patient to underwentCT-guided drainage procedure for abdominal wall abscess -Interventional radiology placed a 10 French drain in her abscess on 01/27/21 and it yielded 65 cc of amber-colored fluid.  Her drain was connected to gravity bag and was sent off for culture and is growing out Klebsiella pneumoniae and Sensitivities only show Resistance to Ampicillin; so ID is recommending Cipro/Flagyl given her PCN Allergy.   -General Surgery was reconsulted and they evaluated the patient and feel that her fistula is related to her surgery in [place] in February. Dr. Harlow Asa evaluated recommends continuing the drain and having the patient follow-up in the IR drain clinic in 10 to 14 days and may need a drain injection to evaluate for a fistula.  Interventional radiology recommends continuing current drain management with every shift flushes and monitoring of output and planning repeat CT scan and possible drain injection when output is less than 10 cc a day to assess for possible removal. -Continue drain management per interventional radiology and surgical recommendation to monitor output; patient will need follow-up with her surgeon in [place]  C. difficile Enteritis and Diarrhea; improving  -Patient with multiple loose stools, which seems to be slowing down after Lomotil given last night.   -Patient drank contrast with CT abdomen and pelvis and stated started to have loose stool again.  -Patient describing stool  with some mucus noted in. -Afebrile.   -Continue oral vancomycin, probiotic and states diarrhea is slowing down  -Creon added per oncology due to concern for possible of short-bowel with respect to diarrhea as patient had told oncologist has a lot of stool that floats concerning for fatty  content.  -Patient complained of abdominal pain since starting Creon and requested Creon to be discontinued which it has.   -Patient stated some improvement with abdominal pain after Creon discontinued.   -PPI discontinued patient started on Pepcid.  -A dose of Dificid was given the morning of 01/23/2021, however patient wants to continue oral vancomycin for now as she feels number of stools slowly decreasing. -CT abdomen and pelvis ordered per Oncology for further evaluation of patient's abdominal pain. -Continue oral vancomycin to complete a 14-day course on 01/29/2021. -ID was consulted, see recommendations and they are recommending agreeing with supportive care (Florastor and Polycarbophil) and recommending the patient finish a total of 14 days of p.o. vancomycin with the last day being 01/30/2019.  She is not on systemic antibiotics given her drain culture -She continues to have some abdominal pain so was on pain control with 2 mg of p.o. hydromorphone every 4 hours as needed for severe pain; Now medical oncology has increased this to 1 mg every 2 hours as needed severe pain and 2 mg p.o. every 2 as needed for severe pain and will defer to them to them for Pain Control  Metastatic appendiceal cancer, pseudomyxoma peritonei and peritoneal carcinomatosis status post CRS/HI PEC and splenectomy in 2005 and recently CRS/HI PEC on 12/10/2020 for recurrent cancer -Medical Oncology consulted and appreciate further evaluation and Recommendations -Will need to follow up at D/C with Surgeon in [facility] or Specialist at [facility]  History of DVT and PE status post IVC filter -Continue home regimen prophylactic Lovenox 40 mg sq q24h but may need to escalate given 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 daily with breakfast, 5 mg p.o. daily, and then 10 mg p.o. nightly.  Type 2 Diabetes Mellitus  -Hemoglobin A1c 5.8 (3/26) -Diet controlled. -Blood Sugars ranging from 105-122 on daily BMP/CMP -If necessary will place on sensitive NovoLog sliding scale insulin AC  Hypokalemia -Secondary to GI losses.   -Potassium is improved to 4.0 -Magnesium at 1.9 -Continue to Monitor and Trend -Repeat CMP in the AM   Autoimmune Hepatitis -Continue to hold azathioprine and could likely resume once antibiotic course has been completed with clinical improvement.  -LFTs currently normal with an AST of 30 and ALT of 16 -Continue to monitor and trend and repeat CMP in a.m.  Normocytic Anemia -Patient's hemoglobin/hematocrit is relatively stable at 10.6/34.1 with an MCV of 100.3 -Checked Anemia Panel and showed an iron level of 27, U IBC 273, TIBC of 300, saturation ratios of 9%, ferritin level of 76, folate level 15.5, vitamin B12 240 -She received a dose of Feraheme yesterday -Continue to monitor for signs and symptoms bleeding; currently no overt bleeding noted -Repeat CBC in a.m.  Thrombocytosis -Patient's Platelet Count went from 310 -> 347 -> 392 -> 408 and today is now 383 -Continue to Monitor and Trend -Repeat CBC in the  AM   DVT prophylaxis: Enoxaparin 40 mg sq q24h Code Status: FULL CODE  Family Communication: No family present at bedside  Disposition Plan: Pending further clinical improvement and clearance by infectious diseases, medical oncology, general surgery and interventional radiology  Status is: Inpatient  Remains inpatient appropriate because:Unsafe d/c plan, IV treatments appropriate due to intensity of illness or inability to take PO and Inpatient level of care appropriate due to severity of illness  Dispo: The patient is from: Home              Anticipated d/c is  to: TBD              Patient currently is not medically stable to d/c.   Difficult to place patient No  Consultants:   Medical Oncology  General Surgery  Interventional Radiology  Infectious Diseases    Procedures:   CT abdomen and pelvis 01/16/2021  CT abdomen and pelvis 01/26/2021  CT guided drainage procedure of abdominal wall abscess 01/27/21  Antimicrobials:  Anti-infectives (From admission, onward)   Start     Dose/Rate Route Frequency Ordered Stop   01/28/21 1800  metroNIDAZOLE (FLAGYL) IVPB 500 mg        500 mg 100 mL/hr over 60 Minutes Intravenous Every 8 hours 01/28/21 1517     01/28/21 1600  ciprofloxacin (CIPRO) IVPB 400 mg        400 mg 200 mL/hr over 60 Minutes Intravenous Every 12 hours 01/28/21 1517     01/23/21 1315  vancomycin (VANCOCIN) 50 mg/mL oral solution 125 mg        125 mg Oral Every 6 hours 01/23/21 1226 01/29/21 2359   01/23/21 1000  fidaxomicin (DIFICID) tablet 200 mg  Status:  Discontinued        200 mg Oral 2 times daily 01/23/21 0859 01/23/21 1226   01/17/21 0600  vancomycin (VANCOREADY) IVPB 1500 mg/300 mL  Status:  Discontinued        1,500 mg 150 mL/hr over 120 Minutes Intravenous Every 24 hours 01/16/21 1732 01/17/21 1216   01/16/21 2200  metroNIDAZOLE (FLAGYL) IVPB 500 mg  Status:  Discontinued        500 mg 100 mL/hr over 60 Minutes Intravenous Every 8 hours 01/16/21 1622 01/17/21 1216   01/16/21 2000  vancomycin (VANCOCIN) 50 mg/mL oral solution 125 mg  Status:  Discontinued        125 mg Oral 4 times daily 01/16/21 1805 01/23/21 0859   01/16/21 1800  ceFEPIme (MAXIPIME) 2 g in sodium chloride 0.9 % 100 mL IVPB  Status:  Discontinued        2 g 200 mL/hr over 30 Minutes Intravenous Every 8 hours 01/16/21 1728 01/17/21 1216   01/16/21 1545  vancomycin (VANCOCIN) 50 mg/mL oral solution 125 mg  Status:  Discontinued        125 mg Oral 4 times daily 01/16/21 1539 01/16/21 1622   01/16/21 1445  metroNIDAZOLE (FLAGYL) IVPB 500 mg         500 mg 100 mL/hr over 60 Minutes Intravenous  Once 01/16/21 1438 01/16/21 1622   01/16/21 1330  vancomycin (VANCOCIN) IVPB 1000 mg/200 mL premix        1,000 mg 200 mL/hr over 60 Minutes Intravenous  Once 01/16/21 1320 01/16/21 1538        Subjective: Patient was seen and examined at bedside and does not know if the scopolamine patch is working but thinks her nausea is a little bit better.  Appears calm and comfortable and denies any abdominal pain currently.  Abdominal drain in place.  Started back on antibiotics yesterday.  She denies any chest pain or lightheadedness or dizziness and no other concerns or complaints at this time.  Objective: Vitals:   01/28/21 1410 01/28/21 1415 01/28/21 2023 01/29/21 0317  BP: 135/84 121/79 (!) 133/94 132/77  Pulse: 86 85 92 79  Resp: (!) 6 15 18 18   Temp:   98.8 F (37.1 C) 98.9 F (37.2 C)  TempSrc:   Oral Oral  SpO2:  91% 90% 91%  Weight:      Height:        Intake/Output Summary (Last 24 hours) at 01/29/2021 1349 Last data filed at 01/29/2021 0900 Gross per 24 hour  Intake 640 ml  Output 250 ml  Net 390 ml   Filed Weights   01/16/21 1700  Weight: 102.1 kg   Examination: Physical Exam:  Constitutional: WN/WD chronically ill-appearing Caucasian female currently in no acute distress appears more comfortable today than she did yesterday. Eyes: Lids and conjunctivae normal, sclerae anicteric  ENMT: External Ears, Nose appear normal. Grossly normal hearing.  Neck: Appears normal, supple, no cervical masses, normal ROM, no appreciable thyromegaly; no appreciable JVD Respiratory: Diminished to auscultation bilaterally with coarse breath sounds, no wheezing, rales, rhonchi or crackles. Normal respiratory effort and patient is not tachypenic. No accessory muscle use.  Unlabored breathing Cardiovascular: RRR, no murmurs / rubs / gallops. S1 and S2 auscultated.  Trace extremity edema Abdomen: Soft, mildly tender, distended secondary to body  habitus.  Abdominal drain in place. bowel sounds positive and a little hyperactive.  GU: Deferred. Musculoskeletal: No clubbing / cyanosis of digits/nails. No joint deformity upper and lower extremities.  Skin: No rashes, lesions, ulcers on limited skin evaluation. No induration; Warm and dry.  Neurologic: CN 2-12 grossly intact with no focal deficits. Romberg and sign cerebellar reflexes not assessed.  Psychiatric: Normal judgment and insight. Alert and oriented x 3. Normal mood and appropriate affect.   Data Reviewed: I have personally reviewed following labs and imaging studies  CBC: Recent Labs  Lab 01/23/21 0537 01/25/21 0542 01/27/21 0459 01/28/21 0453 01/29/21 0601  WBC 9.5 9.3 8.8 10.4 10.7*  NEUTROABS 4.3 3.5 3.4 4.6 4.5  HGB 10.3* 9.8* 10.0* 10.7* 10.6*  HCT 33.5* 31.8* 31.9* 35.0* 34.1*  MCV 99.4 99.7 98.8 100.9* 100.3*  PLT 310 347 392 408* 920   Basic Metabolic Panel: Recent Labs  Lab 01/23/21 0537 01/24/21 0524 01/25/21 0542 01/26/21 0557 01/27/21 0459 01/28/21 0453 01/29/21 0601  NA 140 138 139 140 140 139 136  K 4.1 4.0 3.5 3.8 3.9 3.4* 4.0  CL 105 103 105 105 105 103 103  CO2 26 26 28 28 26 26 24   GLUCOSE 113* 113* 112* 122* 112* 109* 109*  BUN 11 12 11 9 9 10 10   CREATININE 0.49 0.57 0.41* 0.45 0.57 0.48 0.54  CALCIUM 8.5* 8.6* 8.4* 8.6* 8.5* 8.6* 8.9  MG 1.9 1.8 1.8 2.0  --  1.9 1.9  PHOS 3.2  --  4.0  --   --  3.8 3.4   GFR: Estimated Creatinine Clearance: 92.4 mL/min (by C-G formula based on SCr of 0.54 mg/dL). Liver Function Tests: Recent Labs  Lab 01/23/21 0537 01/25/21 0542 01/27/21 0459 01/28/21 0453 01/29/21 0601  AST  --   --  35 32 30  ALT  --   --  14 18 16   ALKPHOS  --   --  263* 275* 261*  BILITOT  --   --  0.6 0.7 0.8  PROT  --   --  6.4* 6.7 6.5  ALBUMIN 2.2* 2.3* 2.2* 2.3* 2.3*   No results for input(s): LIPASE, AMYLASE in the last 168 hours. No results for input(s): AMMONIA in the last 168 hours. Coagulation  Profile: No results for input(s): INR, PROTIME in the last 168 hours. Cardiac Enzymes: No results for input(s): CKTOTAL, CKMB, CKMBINDEX, TROPONINI in the last 168 hours. BNP (last 3 results) No results for input(s): PROBNP in the last 8760 hours. HbA1C: No results for input(s): HGBA1C in the last 72 hours. CBG: No results for input(s): GLUCAP in the last 168 hours. Lipid Profile: No results for input(s): CHOL, HDL, LDLCALC, TRIG, CHOLHDL, LDLDIRECT in the last 72 hours. Thyroid Function Tests: No results for input(s): TSH, T4TOTAL, FREET4, T3FREE, THYROIDAB in the last 72 hours. Anemia Panel: Recent Labs    01/28/21 0453  VITAMINB12 240  FOLATE 15.5  FERRITIN 76  TIBC 300  IRON 27*  RETICCTPCT 3.1   Sepsis Labs: No results for input(s): PROCALCITON, LATICACIDVEN in the last 168 hours.  Recent Results (from the past 240 hour(s))  Aerobic/Anaerobic Culture (surgical/deep wound)     Status: None (Preliminary result)   Collection Time: 01/27/21  3:47 PM   Specimen: Abscess  Result Value Ref Range Status   Specimen Description ABSCESS POST CT GUIDED DRAIN  Final   Special Requests NONE  Final   Gram Stain   Final    ABUNDANT WBC PRESENT, PREDOMINANTLY PMN NO ORGANISMS SEEN    Culture FEW KLEBSIELLA PNEUMONIAE  Final   Report Status PENDING  Incomplete   Organism ID, Bacteria KLEBSIELLA PNEUMONIAE  Final      Susceptibility   Klebsiella pneumoniae - MIC*    AMPICILLIN >=32 RESISTANT Resistant     CEFAZOLIN <=4 SENSITIVE Sensitive     CEFEPIME <=0.12 SENSITIVE Sensitive     CEFTAZIDIME <=1 SENSITIVE Sensitive     CEFTRIAXONE <=0.25 SENSITIVE Sensitive     CIPROFLOXACIN <=0.25 SENSITIVE Sensitive     GENTAMICIN <=1 SENSITIVE Sensitive     IMIPENEM <=0.25 SENSITIVE Sensitive     TRIMETH/SULFA <=20 SENSITIVE Sensitive     AMPICILLIN/SULBACTAM 4 SENSITIVE Sensitive     PIP/TAZO Value in next row Sensitive      <=4 SENSITIVEPerformed at [facility], [address] [place], [place] [address]    * FEW KLEBSIELLA PNEUMONIAE     RN Pressure Injury Documentation:     Estimated body mass index is 36.32 kg/m as calculated from the following:   Height as of this encounter: 5\' 6"  (1.676 m).   Weight as of this encounter: 102.1 kg.  Malnutrition Type:  Nutrition Problem: Increased nutrient needs Etiology: cancer and cancer related treatments   Malnutrition Characteristics:  Signs/Symptoms: estimated needs   Nutrition Interventions:  Interventions: Juven,MVI,Premier Protein   Radiology Studies: CT IMAGE GUIDED FLUID DRAIN BY CATHETER  Result Date: 01/27/2021 INDICATION: History of pseudomyxoma peritonei with multiple operative interventions performed at an outside institution, now with draining midline abdominal and concern for intraperitoneal abscess. Please perform CT-guided per drainage catheter for infection source control purposes. EXAM: CT IMAGE GUIDED FLUID DRAIN BY CATHETER COMPARISON:  CT abdomen pelvis-01/26/2021; 01/16/2021 MEDICATIONS: The patient is currently admitted to the hospital and receiving intravenous antibiotics. The antibiotics were administered within an appropriate time frame prior to the initiation of the procedure. ANESTHESIA/SEDATION: Moderate (conscious) sedation was employed during this  procedure. A total of Versed 2 mg and Fentanyl 100 mcg was administered intravenously. Moderate Sedation Time: 14 minutes. The patient's level of consciousness and vital signs were monitored continuously by radiology nursing throughout the procedure under my direct supervision. CONTRAST:  None COMPLICATIONS: None immediate. PROCEDURE: Informed written consent was obtained from the patient after a discussion of the risks, benefits and alternatives to treatment. The patient was placed supine on the CT gantry and a pre procedural CT was performed re-demonstrating the known abscess/fluid collection within the midline of the abdomen with dominant  intraperitoneal component measuring approximately 2.9 x 2.3 cm and subcutaneous component measuring approximately 4.4 x 3.1 cm (image 2, series 3). The procedure was planned. A timeout was performed prior to the initiation of the procedure. The skin overlying the ventral aspect of the mid abdomen was prepped and draped in the usual sterile fashion. The overlying soft tissues were anesthetized with 1% lidocaine with epinephrine. Next, a 73 gauge trocar needle was advanced into the abscess/fluid collection within the peritoneal cavity through the subcutaneous component and a short Amplatz super stiff wire was coiled within the collection. Appropriate positioning was confirmed with a limited CT scan. The tract was serially dilated allowing placement of a 12 [place] all-purpose drainage catheter. Appropriate positioning was confirmed with a limited postprocedural CT scan. Next, approximately 65 ml of amber colored fluid was aspirated. The tube was connected to a drainage bag and sutured in place. Postprocedural imaging was obtained demonstrating near complete resolution of both the intraperitoneal and subcutaneous components of the collection (series 9). Dressings were applied. The patient tolerated the procedure well without immediate post procedural complication. IMPRESSION: Successful CT guided placement of a 5 French all purpose drain catheter into the midline abdominal abscess with aspiration of 65 mL of purulent fluid. Samples were sent to the laboratory as requested by the ordering clinical team. Electronically Signed   By: Sandi Mariscal M.D.   On: 01/27/2021 16:35   Scheduled Meds: . amLODipine  10 mg Oral Daily  . busPIRone  5 mg Oral Q breakfast   And  . busPIRone  5 mg Oral Q1200   And  . busPIRone  10 mg Oral QHS  . enoxaparin  40 mg Subcutaneous Q24H  . famotidine  40 mg Oral Daily  . fentaNYL  1 patch Transdermal Q72H  . Gerhardt's butt cream   Topical QID  . multivitamin with minerals  1 tablet  Oral Daily  . polycarbophil  625 mg Oral Daily  . propranolol  20 mg Oral BID  . saccharomyces boulardii  250 mg Oral BID  . scopolamine  1 patch Transdermal Q72H  . sodium chloride flush  3 mL Intravenous Q12H  . vancomycin  125 mg Oral Q6H   Continuous Infusions: . ciprofloxacin 400 mg (01/29/21 1100)  . metronidazole 500 mg (01/29/21 0959)    LOS: 13 days   Kerney Elbe, DO [facility] PAGER is on [place]  If 7PM-7AM, please contact night-coverage [email or web address]

## 2021-01-30 DIAGNOSIS — L02211 Cutaneous abscess of abdominal wall: Secondary | ICD-10-CM | POA: Diagnosis not present

## 2021-01-30 DIAGNOSIS — K754 Autoimmune hepatitis: Secondary | ICD-10-CM | POA: Diagnosis not present

## 2021-01-30 DIAGNOSIS — A0472 Enterocolitis due to Clostridium difficile, not specified as recurrent: Secondary | ICD-10-CM | POA: Diagnosis not present

## 2021-01-30 DIAGNOSIS — T8149XA Infection following a procedure, other surgical site, initial encounter: Secondary | ICD-10-CM | POA: Diagnosis not present

## 2021-01-30 LAB — CBC WITH DIFFERENTIAL/PLATELET
Abs Immature Granulocytes: 0.03 10*3/uL (ref 0.00–0.07)
Basophils Absolute: 0.1 10*3/uL (ref 0.0–0.1)
Basophils Relative: 1 %
Eosinophils Absolute: 0.2 10*3/uL (ref 0.0–0.5)
Eosinophils Relative: 3 %
HCT: 34.7 % — ABNORMAL LOW (ref 36.0–46.0)
Hemoglobin: 10.7 g/dL — ABNORMAL LOW (ref 12.0–15.0)
Immature Granulocytes: 0 %
Lymphocytes Relative: 52 %
Lymphs Abs: 4 10*3/uL (ref 0.7–4.0)
MCH: 30.7 pg (ref 26.0–34.0)
MCHC: 30.8 g/dL (ref 30.0–36.0)
MCV: 99.4 fL (ref 80.0–100.0)
Monocytes Absolute: 1.3 10*3/uL — ABNORMAL HIGH (ref 0.1–1.0)
Monocytes Relative: 16 %
Neutro Abs: 2.2 10*3/uL (ref 1.7–7.7)
Neutrophils Relative %: 28 %
Platelets: 417 10*3/uL — ABNORMAL HIGH (ref 150–400)
RBC: 3.49 MIL/uL — ABNORMAL LOW (ref 3.87–5.11)
RDW: 18.8 % — ABNORMAL HIGH (ref 11.5–15.5)
WBC: 7.8 10*3/uL (ref 4.0–10.5)
nRBC: 0.8 % — ABNORMAL HIGH (ref 0.0–0.2)

## 2021-01-30 LAB — COMPREHENSIVE METABOLIC PANEL
ALT: 14 U/L (ref 0–44)
AST: 29 U/L (ref 15–41)
Albumin: 2.4 g/dL — ABNORMAL LOW (ref 3.5–5.0)
Alkaline Phosphatase: 265 U/L — ABNORMAL HIGH (ref 38–126)
Anion gap: 9 (ref 5–15)
BUN: 9 mg/dL (ref 6–20)
CO2: 28 mmol/L (ref 22–32)
Calcium: 9 mg/dL (ref 8.9–10.3)
Chloride: 104 mmol/L (ref 98–111)
Creatinine, Ser: 0.53 mg/dL (ref 0.44–1.00)
GFR, Estimated: >60 mL/min (ref >60)
Glucose, Bld: 116 mg/dL — ABNORMAL HIGH (ref 70–99)
Potassium: 4.3 mmol/L (ref 3.5–5.1)
Sodium: 141 mmol/L (ref 135–145)
Total Bilirubin: 0.6 mg/dL (ref 0.3–1.2)
Total Protein: 6.9 g/dL (ref 6.5–8.1)

## 2021-01-30 LAB — PHOSPHORUS: Phosphorus: 3.7 mg/dL (ref 2.5–4.6)

## 2021-01-30 LAB — MAGNESIUM: Magnesium: 2 mg/dL (ref 1.7–2.4)

## 2021-01-30 MED ORDER — SODIUM CHLORIDE 0.9% FLUSH
5.0000 mL | Freq: Three times a day (TID) | INTRAVENOUS | Status: DC
  Administered 2021-01-30 – 2021-02-10 (×25): 5 mL

## 2021-01-30 NOTE — Progress Notes (Signed)
Subjective/Chief Complaint: Some nausea, having bowel function, diarrhea better   Objective: Vital signs in last 24 hours: Temp:  [98.6 F (37 C)-98.9 F (37.2 C)] 98.9 F (37.2 C) (04/09 0532) Pulse Rate:  [73-80] 73 (04/09 0532) Resp:  [15-16] 15 (04/09 0532) BP: (123-129)/(69-77) 123/69 (04/09 0532) SpO2:  [92 %-94 %] 94 % (04/09 0532) Last BM Date: 01/29/21  Intake/Output from previous day: 04/08 0701 - 04/09 0700 In: 1064.1 [P.O.:660; IV Piggyback:304.1] Out: 450 [Drains:450] Intake/Output this shift: No intake/output data recorded.  Ab drain more clear, it does not appear to be enteric, soft , indision clean  Lab Results:  Recent Labs    01/29/21 0601 01/30/21 0531  WBC 10.7* 7.8  HGB 10.6* 10.7*  HCT 34.1* 34.7*  PLT 383 417*   BMET Recent Labs    01/29/21 0601 01/30/21 0531  NA 136 141  K 4.0 4.3  CL 103 104  CO2 24 28  GLUCOSE 109* 116*  BUN 10 9  CREATININE 0.54 0.53  CALCIUM 8.9 9.0   PT/INR No results for input(s): LABPROT, INR in the last 72 hours. ABG No results for input(s): PHART, HCO3 in the last 72 hours.  Invalid input(s): PCO2, PO2  Studies/Results: No results found.  Anti-infectives: Anti-infectives (From admission, onward)   Start     Dose/Rate Route Frequency Ordered Stop   01/30/21 1000  vancomycin (VANCOCIN) 50 mg/mL oral solution 125 mg        125 mg Oral Every 12 hours 01/29/21 1431     01/29/21 2200  metroNIDAZOLE (FLAGYL) tablet 500 mg        500 mg Oral Every 12 hours 01/29/21 1700     01/29/21 1800  metroNIDAZOLE (FLAGYL) tablet 500 mg  Status:  Discontinued        500 mg Oral Every 8 hours 01/29/21 1431 01/29/21 1700   01/29/21 1530  cefdinir (OMNICEF) capsule 300 mg        300 mg Oral Every 12 hours 01/29/21 1431     01/28/21 1800  metroNIDAZOLE (FLAGYL) IVPB 500 mg  Status:  Discontinued        500 mg 100 mL/hr over 60 Minutes Intravenous Every 8 hours 01/28/21 1517 01/29/21 1431   01/28/21 1600   ciprofloxacin (CIPRO) IVPB 400 mg  Status:  Discontinued        400 mg 200 mL/hr over 60 Minutes Intravenous Every 12 hours 01/28/21 1517 01/29/21 1431   01/23/21 1315  vancomycin (VANCOCIN) 50 mg/mL oral solution 125 mg        125 mg Oral Every 6 hours 01/23/21 1226 01/29/21 2359   01/23/21 1000  fidaxomicin (DIFICID) tablet 200 mg  Status:  Discontinued        200 mg Oral 2 times daily 01/23/21 0859 01/23/21 1226   01/17/21 0600  vancomycin (VANCOREADY) IVPB 1500 mg/300 mL  Status:  Discontinued        1,500 mg 150 mL/hr over 120 Minutes Intravenous Every 24 hours 01/16/21 1732 01/17/21 1216   01/16/21 2200  metroNIDAZOLE (FLAGYL) IVPB 500 mg  Status:  Discontinued        500 mg 100 mL/hr over 60 Minutes Intravenous Every 8 hours 01/16/21 1622 01/17/21 1216   01/16/21 2000  vancomycin (VANCOCIN) 50 mg/mL oral solution 125 mg  Status:  Discontinued        125 mg Oral 4 times daily 01/16/21 1805 01/23/21 0859   01/16/21 1800  ceFEPIme (MAXIPIME) 2 g in sodium  chloride 0.9 % 100 mL IVPB  Status:  Discontinued        2 g 200 mL/hr over 30 Minutes Intravenous Every 8 hours 01/16/21 1728 01/17/21 1216   01/16/21 1545  vancomycin (VANCOCIN) 50 mg/mL oral solution 125 mg  Status:  Discontinued        125 mg Oral 4 times daily 01/16/21 1539 01/16/21 1622   01/16/21 1445  metroNIDAZOLE (FLAGYL) IVPB 500 mg        500 mg 100 mL/hr over 60 Minutes Intravenous  Once 01/16/21 1438 01/16/21 1622   01/16/21 1330  vancomycin (VANCOCIN) IVPB 1000 mg/200 mL premix        1,000 mg 200 mL/hr over 60 Minutes Intravenous  Once 01/16/21 1320 01/16/21 1538      Assessment/Plan: Metastatic appendiceal cancer, pseudomyxoma peritonei and peritoneal carcinomatosis -S/PCRS/HIPECand splenectomy in 2005 -Recurrent cancer S/PHIPECand debulkingon 12/10/2020 in [place];complicated by enterotomy requiring return to the OR on postop day 4 and then again for closure of open midline wound -Postop abdominal wall  fluid collection and deep pelvic collectionon CT - repeat CT scan 4/5 showed slight interval increase in size of the anterior abdominal wall abscess which appears to communicate with a smaller intra-abdominal abscess; findings worrisome for a fistula; persistent fluid collection in the pelvis just above the bladder anteriorly, this could be a liquified hematoma or postoperative seroma - s/p IR drain placement 4/6 in abdominal wall fluid collection, output is not frank stool but it was initially dark/thick with sediment concerning for possible leak >> cx pending:FEW KLEBSIELLA PNEUMONIAE  -there is zero reason she needs surgery now.  Wbc is now normal.  She really needs repeat ct scan and drain study. If she is going to be here for next 48 hours I would do this here as if she does have a proven fistula could consider more measures to get it to heal conservatively.  If she goes home I would recommend a short term interval follow up with IR for drain study and repeat ct scan -on abx per ID, I do not think she needs to be on the duration mentioned as long as there is source control, I do not see any positive blood cultures either  C diff colitis -on PO vancomycin, diarrhea improving  ID -PO vancomycin, cipro/flagyl 4/7>> FEN -HH/CM diet VTE -lovenox Foley -none  Dispo- follow up [facility] 01/30/2021

## 2021-01-30 NOTE — Progress Notes (Signed)
It seems like the situation might be getting a little bit better.  She still has a drain in the abscess.  The abscess is gone Klebsiella.  It is sensitive to ciprofloxacin.  She is on vancomycin for the C. difficile.  There is debate is whether or not she will need any intervention if there is a fistula.  It sounds like she might be going up to [place] where she had her initial surgery for an evaluation for this.  She still does not have much of an appetite.  She has nausea.  Her pain seems to be doing better.  She still getting breakthrough pain medication on a regular basis.  We do have her on the Duragesic patch.  There is been no bleeding.  She has had no issues with fever.  She is out of bed a little bit.  She might need some physical therapy because of some deconditioning.  Her labs show white cell count of 7.8.  Hemoglobin 10.7.  Platelet count 417,000.  Her albumin is 2.4.  Her BUN is 9 creatinine 0.53.  She did get iron on the eighth.  Her iron saturation was only 9%.  Over this will help with the anemia.  I am not sure how long she is going to have this training.  I know that interventional radiology is managing all of this.  Her vital signs show temperature of 98.9.  Pulse 73.  Blood pressure 123/69.  Her lungs are clear bilaterally.  Cardiac exam regular rate and rhythm.  Abdomen is soft.  Bowel sounds are present.  She has a drainage catheter coming out of the lower abdomen.  She has some tenderness to palpation in the lower abdomen.  There is no fluid wave.  There is no palpable liver or spleen tip.  Neurological exam is nonfocal.  Hopefully, the ability to get her home is getting closer.  Again I know she has drainage catheter in.  Her diarrhea seems to be improving.  From what she says she is on has been on oral vancomycin as long she is on antibiotics for the Klebsiella in her abscess.  I am not sure how long she needs to be on ciprofloxacin.  I know this is all  complicated.  I do appreciate everybody's help.  [name], MD  Jude 1:20

## 2021-01-30 NOTE — Progress Notes (Signed)
PROGRESS NOTE    Wendy Barber  CHE:[id number] DOB: 1960-12-10 DOA: 01/16/2021 PCP: Glendon Axe, MD  Brief Narrative:  The patient is a 60 year old Caucasian female with a past medical history significant for but not limited to metastatic appendiceal cancer, pseudomyxoma peritonei and peritoneal carcinomatosis status post CRS/HIPEC and splenectomy in 2005 and recent CRS/HIPEC on 12/10/2020 for recurrent cancer, autoimmune hepatitis, DVT and PE with IVC filter currently on Lovenox, hypertension, diabetes mellitus type 2 as well as other comorbidities who presented to the ED with multiple complaints including intractable diarrhea since her recent procedure and fatigue as well as wound drainage.  Since her last hospitalization February she has had persistent diarrhea but has negative for C. difficile started on Imodium and Lomotil with some improvement but recently seen by her oncologist in [place] on 316 and states that she is started on antibiotics at that time.  Since then her diarrhea has worsened and she is more fatigued and has had a decreased appetite with increased abdominal pain.  She went to the bathroom and had an area of open incision with significant amount of purulent foul-smelling drainage.  Was admitted for C. difficile colitis and for concern for postop wound infection.  Surgery was initially evaluated and signed off the case however medical oncology obtained a CT scan of the abdomen pelvis yesterday which showed that her abdominal abscess appears to be little bit larger and that she may have a fistula so IR and General Surgery were reconsulted.   Interventional radiology placed a 10 French drain in her abscess on 01/27/21 and it yielded 65 cc of amber-colored fluid.  Her drain was connected to gravity bag and was sent off for culture and is growing out Klebsiella pneumoniae so ID is recommending Cipro/Flagyl given her PCN Allergy.  General Surgery was reconsulted and they evaluated the  patient and feel that her fistula is related to her surgery in [place] in February.  Dr. Harlow Asa evaluated recommends continuing the drain and having the patient follow-up in the IR drain clinic in 10 to 14 days and may need a drain injection to evaluate for a fistula.  Interventional radiology recommends continuing current drain management with every shift flushes and monitoring of output and planning repeat CT scan and possible drain injection when output is less than 10 cc a day to assess for possible removal.  Patient's pain seems controlled today but she still felt a little nauseous.  Diarrhea has improved and she is completed her antibiotics for C. difficile however has been started on IV ciprofloxacin and IV Flagyl for the Klebsiella growing from her abdominal abscess and white count slowly trended up.  Oncology continues her on a fentanyl patch and alternating her on p.o. Dilaudid and IV.  She also received a dose of Feraheme on 01/28/21.  General surgery recommending continuing drain management and continue to monitor output and patient is to be arranged follow-up with the IR drain clinic and follow-up with her surgeon in [place]  ID has re-evaluated and recommending Vancomycin 50 mg po BID for the duration of systemic Abx + 1 week beyond and they are also recommending transitioning po Metronidazole 500 mg po BID and Cefdinir 300 mg po BID x 6 weeks until 03/12/21. General Surgery recommending repeating CT scan and drain study in the next 48 hours given that her pain is still relatively uncontrolled and given that her nausea is still there but improving.  Diarrhea is also improving  Assessment & Plan:   Principal Problem:  Surgical wound infection Active Problems:   Autoimmune hepatitis ([place])   DM2 (diabetes mellitus, type 2) ([place])   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)   Malignant pseudomyxoma peritonei ([place])   DVT of deep femoral vein, left ([place])   Pulmonary  embolism, bilateral (H[facility])   Presence of IVC filter   C. difficile colitis   Abdominal wall abscess   Hypokalemia  Abdominal postsurgical Wound Infection and Associated Abdominal Abscess status post midline lower abdominal drain placement by interventional radiology on 01/27/2021 -Patient presented with concerns for abdominal postsurgical wound infection. -Patient afebrile on room air, no leukocytosis. -Status post cytoreductive surgery/HI PEC 12/10/2020 at [facility] in [place], [place] with major reduction of small bowel, proximal jejunum, ascending colon, partial cystectomy. -Postop course for anastomotic leak and taken back to the OR on POD #13 with closure of abdominal wound on POD #16. -Status post JP drain recently removed 01/06/2021. -Initial CT abdomen and pelvis done concerning for anterior abdominal wall ventral surgical wound collection three, 3.2 x 1.8 x 13 cm concerning for infection as well as additional loculated fluid collection in the pelvis superior and anterior to the urinary bladder measuring 5.5 x 2.6 x 3.8 cm. -Asplenic. -General surgery following and suspected superficial wound seroma in abdominal wall, recommend wound packing daily, suspect fluid collection is sterile fluid collection is sterile. -Initial Wound cultures with few actinobacter species, rare Candida albicans. -Discussed with general surgery about wound culture results and they Did not feel she an active infection at this time but now she is growing Klebsiella from Drain Abscess and ID was reconsulted and recommended antibiotics and now change the patient to p.o. metronidazole p.o. Cefdinir and recommending antibiotics -Continued to Monitor leukocytosis and is slowly trending up and if worsening may need to add fluconazole but will defer to ID; ID recommending Cipro/Flagyl given Klebsiella from Drain Cx and recommending changing to p.o. cefdinir and p.o. Flagyl -Pain management was adjusted by medical  oncology and may need to adjust given that nursing staff feels the patient is is over utilizing the pain control -Saline lock IV fluids.   -WBC has trended up from 8.8 -> 10.4 -> 10.7 -> 7.8 -Repeat CT abdomen and pelvis ordered per hematology/oncology on 01/26/21 for further evaluation of patient's abdominal pain and showed "Persistent inflammatory type changes involving the colon and small bowel. Slight interval increase in size of the anterior abdominal wall abscess which appears to communicate with a smaller intra-abdominal abscess. Findings worrisome for a fistula. Persistent  Fluid collection in the pelvis just above the bladder anteriorly. This could be a liquified hematoma or postoperative seroma. 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 This may account for some of the colonic inflammation which could be due to vascular congestion. -IR also reconsulted and patient to underwent CT-guided drainage procedure for abdominal wall abscess -Interventional radiology placed a 10 French drain in her abscess on 01/27/21 and it yielded 65 cc of amber-colored fluid.  Her drain was connected to gravity bag and was sent off for culture and is growing out Klebsiella pneumoniae and Sensitivities only show Resistance to Ampicillin; so ID is recommending Cipro/Flagyl given her PCN Allergy.   -General Surgery was reconsulted they feel the fistula was related to her surgery in [place] and they are recommending repeating CT scan and possible drain injection in the next few days; IR was recommending to repeat when output is less than 10 cc a day to assess  for possible removal. -Continue drain management per interventional radiology and surgical recommendation to monitor output; patient will need follow-up with her surgeon in [place] when stable to D/C   C. difficile Enteritis and Diarrhea; improving  -Patient with multiple loose stools, which seems to be slowing down after  Lomotil given last night.   -Patient drank contrast with CT abdomen and pelvis and stated started to have loose stool again.  -Patient describing stool with some mucus noted in. -Afebrile.   -Continue oral vancomycin, probiotic and states diarrhea is slowing down  -Creon added per oncology due to concern for possible of short-bowel with respect to diarrhea as patient had told oncologist has a lot of stool that floats concerning for fatty content.  -Patient complained of abdominal pain since starting Creon and requested Creon to be discontinued which it has.   -Patient stated some improvement with abdominal pain after Creon discontinued.   -PPI discontinued patient started on Pepcid.  -A dose of Dificid was given the morning of 01/23/2021, however patient wants to continue oral vancomycin for now as she feels number of stools slowly decreasing. -CT abdomen and pelvis ordered per Oncology for further evaluation of patient's abdominal pain. -Continue oral vancomycin to complete a 14-day course on 01/29/2021. -ID was consulted, see recommendations and they are recommending agreeing with supportive care (Florastor and Polycarbophil) and recommending the patient finish a total of 14 days of p.o. vancomycin with the last day being 01/30/2019.  She is not on systemic antibiotics given her drain culture -She continues to have some abdominal pain so was on pain control with 2 mg of p.o. hydromorphone every 4 hours as needed for severe pain; Now medical oncology has increased this to 1 mg every 2 hours as needed severe pain and 2 mg p.o. every 2 as needed for severe pain and will defer to them to them for Pain Control; pain is still there but is improving  Metastatic appendiceal cancer, pseudomyxoma peritonei and peritoneal carcinomatosis status post CRS/HI PEC and splenectomy in 2005 and recently CRS/HI PEC on 12/10/2020 for recurrent cancer -Medical Oncology consulted and appreciate further evaluation and  Recommendations -Will need to follow up at D/C with Surgeon in [facility] or Specialist at [facility]  History of DVT and PE status post IVC filter -Continue home regimen prophylactic Lovenox 40 mg sq q24h but may need to escalate given 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 daily with breakfast, 5 mg p.o. daily, and then 10 mg p.o. nightly.  Type 2 Diabetes Mellitus  -Hemoglobin A1c 5.8 (3/26) -Diet controlled. -Blood Sugars ranging from 105-122 on daily BMP/CMP -If necessary will place on sensitive NovoLog sliding scale insulin AC  Hypokalemia -Secondary to GI losses.   -Potassium is improved to 4.3 -Magnesium at 2.0 -Continue to Monitor and Trend -Repeat CMP in the AM   Autoimmune Hepatitis -Continue to hold azathioprine and could likely resume once antibiotic course has been completed with clinical improvement.  -LFTs currently normal with an AST of 29 and ALT of 14 -Continue to monitor and trend and repeat CMP in a.m.  Normocytic Anemia -Patient's hemoglobin/hematocrit is relatively stable at 10.7/34.7 with an MCV of  99.4 -Checked Anemia Panel and showed an iron level of 27, U IBC 273, TIBC of 300, saturation ratios of 9%, ferritin level of 76, folate level 15.5, vitamin B12 240 -She received a dose of Feraheme yesterday -Continue to monitor for signs and symptoms bleeding; currently no overt bleeding noted -Repeat CBC in a.m.  Thrombocytosis -Patient's Platelet Count went from 310 -> 347 -> 392 -> 408 -> 383 -> 417 -Continue to Monitor and Trend -Repeat CBC in the AM   DVT prophylaxis: Enoxaparin 40 mg sq q24h Code Status: FULL CODE  Family  Communication: No family present at bedside  Disposition Plan: Pending further clinical improvement and clearance by infectious diseases, medical oncology, general surgery and interventional radiology  Status is: Inpatient  Remains inpatient appropriate because:Unsafe d/c plan, IV treatments appropriate due to intensity of illness or inability to take PO and Inpatient level of care appropriate due to severity of illness  Dispo: The patient is from: Home              Anticipated d/c is to: TBD              Patient currently is not medically stable to d/c.   Difficult to place patient No  Consultants:   Medical Oncology  General Surgery  Interventional Radiology  Infectious Diseases    Procedures:   CT abdomen and pelvis 01/16/2021  CT abdomen and pelvis 01/26/2021  CT guided drainage procedure of abdominal wall abscess 01/27/21  Antimicrobials:  Anti-infectives (From admission, onward)   Start     Dose/Rate Route Frequency Ordered Stop   01/30/21 1000  vancomycin (VANCOCIN) 50 mg/mL oral solution 125 mg        125 mg Oral Every 12 hours 01/29/21 1431     01/29/21 2200  metroNIDAZOLE (FLAGYL) tablet 500 mg        500 mg Oral Every 12 hours 01/29/21 1700     01/29/21 1800  metroNIDAZOLE (FLAGYL) tablet 500 mg  Status:  Discontinued        500 mg Oral Every 8 hours 01/29/21 1431 01/29/21 1700   01/29/21 1530  cefdinir (OMNICEF) capsule 300 mg        300 mg Oral Every 12 hours 01/29/21 1431     01/28/21 1800  metroNIDAZOLE (FLAGYL) IVPB 500 mg  Status:  Discontinued        500 mg 100 mL/hr over 60 Minutes Intravenous Every 8 hours 01/28/21 1517 01/29/21 1431   01/28/21 1600  ciprofloxacin (CIPRO) IVPB 400 mg  Status:  Discontinued        400 mg 200 mL/hr over 60 Minutes Intravenous Every 12 hours 01/28/21 1517 01/29/21 1431   01/23/21 1315  vancomycin (VANCOCIN) 50 mg/mL oral solution 125 mg        125 mg Oral Every 6 hours 01/23/21 1226 01/29/21 2359   01/23/21 1000   fidaxomicin (DIFICID) tablet 200 mg  Status:  Discontinued        200 mg Oral 2 times daily 01/23/21 0859 01/23/21 1226   01/17/21 0600  vancomycin (VANCOREADY) IVPB 1500 mg/300 mL  Status:  Discontinued        1,500 mg 150 mL/hr over 120 Minutes Intravenous Every 24 hours 01/16/21 1732 01/17/21 1216   01/16/21 2200  metroNIDAZOLE (FLAGYL) IVPB 500 mg  Status:  Discontinued        500 mg 100 mL/hr over 60 Minutes Intravenous Every 8 hours 01/16/21 1622 01/17/21 1216   01/16/21 2000  vancomycin (VANCOCIN) 50 mg/mL oral solution 125 mg  Status:  Discontinued        125 mg Oral 4 times daily 01/16/21 1805 01/23/21 0859   01/16/21 1800  ceFEPIme (MAXIPIME) 2 g in sodium chloride 0.9 % 100 mL IVPB  Status:  Discontinued        2 g 200 mL/hr over 30 Minutes Intravenous Every 8 hours 01/16/21 1728 01/17/21 1216   01/16/21 1545  vancomycin (VANCOCIN) 50 mg/mL oral solution 125 mg  Status:  Discontinued        125 mg Oral 4 times daily 01/16/21 1539 01/16/21 1622   01/16/21 1445  metroNIDAZOLE (FLAGYL) IVPB 500 mg        500 mg 100 mL/hr over 60 Minutes Intravenous  Once 01/16/21 1438 01/16/21 1622   01/16/21 1330  vancomycin (VANCOCIN) IVPB 1000 mg/200 mL premix        1,000 mg 200 mL/hr over 60 Minutes Intravenous  Once 01/16/21 1320 01/16/21 1538        Subjective: Patient was seen and examined at bedside still having some diarrhea but this is slowing down.  Also catches of nausea but thinks is getting better.  Still has abdominal pain but still taking IV pain meds so we will need to continue to work to wean her off the IVs.  General surgery recommending repeating a CT scan of the abdomen pelvis and getting a drain injection to evaluate for the fistula in the next few days.  No other concerns or complaints at this time.  Objective: Vitals:   01/29/21 0317 01/29/21 2123 01/30/21 0532 01/30/21 1418  BP: 132/77 129/77 123/69 132/77  Pulse: 79 80 73 78  Resp: 18 16 15 18   Temp: 98.9 F (37.2  C) 98.6 F (37 C) 98.9 F (37.2 C) 98.7 F (37.1 C)  TempSrc: Oral Oral Oral Oral  SpO2: 91% 92% 94% 91%  Weight:      Height:        Intake/Output Summary (Last 24 hours) at 01/30/2021 1809 Last data filed at 01/30/2021 1421 Gross per 24 hour  Intake 545 ml  Output 600 ml  Net -55 ml   Filed Weights   01/16/21 1700  Weight: 102.1 kg   Examination: Physical Exam:  Constitutional: WN/WD chronically ill-appearing obese Caucasian female in NAD appears a little bit more comfortable  Eyes: Lids and conjunctivae normal, sclerae anicteric  ENMT: External Ears, Nose appear normal. Grossly normal hearing. Neck: Appears normal, supple, no cervical masses, normal ROM, no appreciable thyromegaly: No JVD Respiratory: Diminished to auscultation bilaterally with coarse breath sounds, no wheezing, rales, rhonchi or crackles. Normal respiratory effort and patient is not tachypenic. No accessory muscle use.  Unlabored breathing Cardiovascular: RRR, no murmurs / rubs / gallops. S1 and S2 auscultated.  Trace extremity edema Abdomen: Soft, mildly tender, Distended.  Has an abdominal drain in place. bowel sounds positive.  GU: Deferred. Musculoskeletal: No clubbing / cyanosis of digits/nails. No joint deformity upper and lower extremities.  Skin: No rashes, lesions, ulcers on limited skin evaluation. No induration; Warm and dry.  Neurologic: CN 2-12 grossly intact with no focal deficits. Romberg sign and cerebellar reflexes not assessed.  Psychiatric: Normal judgment and insight. Alert and oriented x 3. Normal mood and appropriate affect.   Data Reviewed: I have personally reviewed following labs and imaging studies  CBC: Recent Labs  Lab 01/25/21 0542 01/27/21 0459 01/28/21 0453 01/29/21 0601 01/30/21 0531  WBC 9.3 8.8 10.4 10.7* 7.8  NEUTROABS 3.5 3.4 4.6 4.5 2.2  HGB 9.8* 10.0* 10.7* 10.6* 10.7*  HCT 31.8* 31.9* 35.0* 34.1* 34.7*  MCV 99.7 98.8 100.9* 100.3* 99.4  PLT 347 392 408* 383  034*   Basic Metabolic Panel: Recent Labs  Lab 01/25/21 0542 01/26/21 0557 01/27/21 0459 01/28/21 0453 01/29/21 0601 01/30/21 0531  NA 139 140 140 139 136 141  K 3.5 3.8 3.9 3.4* 4.0 4.3  CL 105 105 105 103 103 104  CO2 28 28 26 26 24 28   GLUCOSE 112* 122* 112* 109* 109* 116*  BUN 11 9 9 10 10 9   CREATININE 0.41* 0.45 0.57 0.48 0.54 0.53  CALCIUM 8.4* 8.6* 8.5* 8.6* 8.9 9.0  MG 1.8 2.0  --  1.9 1.9 2.0  PHOS 4.0  --   --  3.8 3.4 3.7   GFR: Estimated Creatinine Clearance: 92.4 mL/min (by C-G formula based on SCr of 0.53 mg/dL). Liver Function Tests: Recent Labs  Lab 01/25/21 0542 01/27/21 0459 01/28/21 0453 01/29/21 0601 01/30/21 0531  AST  --  35 32 30 29  ALT  --  14 18 16 14   ALKPHOS  --  263* 275* 261* 265*  BILITOT  --  0.6 0.7 0.8 0.6  PROT  --  6.4* 6.7 6.5 6.9  ALBUMIN 2.3* 2.2* 2.3* 2.3* 2.4*   No results for input(s): LIPASE, AMYLASE in the last 168 hours. No results for input(s): AMMONIA in the last 168 hours. Coagulation Profile: No results for input(s): INR, PROTIME in the last 168 hours. Cardiac Enzymes: No results for input(s): CKTOTAL, CKMB, CKMBINDEX, TROPONINI in the last 168 hours. BNP (last 3 results) No results for input(s): PROBNP in the last 8760 hours. HbA1C: No results for input(s): HGBA1C in the last 72 hours. CBG: No results for input(s): GLUCAP in the last 168 hours. Lipid Profile: No results for input(s): CHOL, HDL, LDLCALC, TRIG, CHOLHDL, LDLDIRECT in the last 72 hours. Thyroid Function Tests: No results for input(s): TSH, T4TOTAL, FREET4, T3FREE, THYROIDAB in the last 72 hours. Anemia Panel: Recent Labs    01/28/21 0453  VITAMINB12 240  FOLATE 15.5  FERRITIN 76  TIBC 300  IRON 27*  RETICCTPCT 3.1   Sepsis Labs: No results for input(s): PROCALCITON, LATICACIDVEN in the last 168 hours.  Recent Results (from the past 240 hour(s))  Aerobic/Anaerobic Culture (surgical/deep wound)     Status: None (Preliminary result)    Collection Time: 01/27/21  3:47 PM   Specimen: Abscess  Result Value Ref Range Status   Specimen Description   Final    ABSCESS POST CT GUIDED DRAIN Performed at [facility] [address], [place], [place] [address]    Special Requests   Final    NONE Performed at [facility], [place] [address] [place], [place] [address]    Gram Stain   Final    ABUNDANT WBC PRESENT, PREDOMINANTLY PMN NO ORGANISMS SEEN Performed at [facility], [place] [address], [place], [place] [address]    Culture   Final    FEW KLEBSIELLA PNEUMONIAE NO ANAEROBES ISOLATED; CULTURE IN PROGRESS FOR 5 DAYS    Report Status PENDING  Incomplete   Organism ID, Bacteria KLEBSIELLA PNEUMONIAE  Final      Susceptibility   Klebsiella pneumoniae - MIC*    AMPICILLIN >=32 RESISTANT Resistant     CEFAZOLIN <=4 SENSITIVE Sensitive     CEFEPIME <=0.12 SENSITIVE Sensitive     CEFTAZIDIME <=1 SENSITIVE Sensitive  CEFTRIAXONE <=0.25 SENSITIVE Sensitive     CIPROFLOXACIN <=0.25 SENSITIVE Sensitive     GENTAMICIN <=1 SENSITIVE Sensitive     IMIPENEM <=0.25 SENSITIVE Sensitive     TRIMETH/SULFA <=20 SENSITIVE Sensitive     AMPICILLIN/SULBACTAM 4 SENSITIVE Sensitive     PIP/TAZO <=4 SENSITIVE Sensitive     * FEW KLEBSIELLA PNEUMONIAE     RN Pressure Injury Documentation:     Estimated body mass index is 36.32 kg/m as calculated from the following:   Height as of this encounter: 5\' 6"  (1.676 m).   Weight as of this encounter: 102.1 kg.  Malnutrition Type:  Nutrition Problem: Increased nutrient needs Etiology: cancer and cancer related treatments   Malnutrition Characteristics:  Signs/Symptoms: estimated needs   Nutrition Interventions:  Interventions: Juven,MVI,Premier Protein   Radiology Studies: No results found. Scheduled Meds: . amLODipine  10 mg Oral Daily  . busPIRone  5 mg Oral Q breakfast   And  . busPIRone  5 mg Oral Q1200   And  . busPIRone  10  mg Oral QHS  . cefdinir  300 mg Oral Q12H  . enoxaparin  40 mg Subcutaneous Q24H  . famotidine  40 mg Oral Daily  . fentaNYL  1 patch Transdermal Q72H  . Gerhardt's butt cream   Topical QID  . metroNIDAZOLE  500 mg Oral Q12H  . multivitamin with minerals  1 tablet Oral Daily  . polycarbophil  625 mg Oral Daily  . propranolol  20 mg Oral BID  . saccharomyces boulardii  250 mg Oral BID  . scopolamine  1 patch Transdermal Q72H  . sodium chloride flush  3 mL Intravenous Q12H  . sodium chloride flush  5 mL Intracatheter Q8H  . vancomycin  125 mg Oral Q12H   Continuous Infusions:   LOS: 14 days   Kerney Elbe, DO [facility] PAGER is on AMION  If 7PM-7AM, please contact night-coverage [email or web address]

## 2021-01-31 DIAGNOSIS — K754 Autoimmune hepatitis: Secondary | ICD-10-CM | POA: Diagnosis not present

## 2021-01-31 DIAGNOSIS — L02211 Cutaneous abscess of abdominal wall: Secondary | ICD-10-CM | POA: Diagnosis not present

## 2021-01-31 DIAGNOSIS — A0472 Enterocolitis due to Clostridium difficile, not specified as recurrent: Secondary | ICD-10-CM | POA: Diagnosis not present

## 2021-01-31 DIAGNOSIS — T8149XA Infection following a procedure, other surgical site, initial encounter: Secondary | ICD-10-CM | POA: Diagnosis not present

## 2021-01-31 LAB — CBC WITH DIFFERENTIAL/PLATELET
Abs Immature Granulocytes: 0.04 10*3/uL (ref 0.00–0.07)
Basophils Absolute: 0.1 10*3/uL (ref 0.0–0.1)
Basophils Relative: 1 %
Eosinophils Absolute: 0.3 10*3/uL (ref 0.0–0.5)
Eosinophils Relative: 3 %
HCT: 35.2 % — ABNORMAL LOW (ref 36.0–46.0)
Hemoglobin: 10.6 g/dL — ABNORMAL LOW (ref 12.0–15.0)
Immature Granulocytes: 1 %
Lymphocytes Relative: 49 %
Lymphs Abs: 4.3 10*3/uL — ABNORMAL HIGH (ref 0.7–4.0)
MCH: 30.5 pg (ref 26.0–34.0)
MCHC: 30.1 g/dL (ref 30.0–36.0)
MCV: 101.4 fL — ABNORMAL HIGH (ref 80.0–100.0)
Monocytes Absolute: 1.3 10*3/uL — ABNORMAL HIGH (ref 0.1–1.0)
Monocytes Relative: 14 %
Neutro Abs: 2.9 10*3/uL (ref 1.7–7.7)
Neutrophils Relative %: 32 %
Platelets: 415 10*3/uL — ABNORMAL HIGH (ref 150–400)
RBC: 3.47 MIL/uL — ABNORMAL LOW (ref 3.87–5.11)
RDW: 19 % — ABNORMAL HIGH (ref 11.5–15.5)
WBC: 8.8 10*3/uL (ref 4.0–10.5)
nRBC: 0.6 % — ABNORMAL HIGH (ref 0.0–0.2)

## 2021-01-31 LAB — COMPREHENSIVE METABOLIC PANEL
ALT: 14 U/L (ref 0–44)
AST: 35 U/L (ref 15–41)
Albumin: 2.4 g/dL — ABNORMAL LOW (ref 3.5–5.0)
Alkaline Phosphatase: 269 U/L — ABNORMAL HIGH (ref 38–126)
Anion gap: 9 (ref 5–15)
BUN: 11 mg/dL (ref 6–20)
CO2: 25 mmol/L (ref 22–32)
Calcium: 8.7 mg/dL — ABNORMAL LOW (ref 8.9–10.3)
Chloride: 103 mmol/L (ref 98–111)
Creatinine, Ser: 0.64 mg/dL (ref 0.44–1.00)
GFR, Estimated: >60 mL/min (ref >60)
Glucose, Bld: 118 mg/dL — ABNORMAL HIGH (ref 70–99)
Potassium: 3.8 mmol/L (ref 3.5–5.1)
Sodium: 137 mmol/L (ref 135–145)
Total Bilirubin: 0.7 mg/dL (ref 0.3–1.2)
Total Protein: 6.7 g/dL (ref 6.5–8.1)

## 2021-01-31 LAB — PHOSPHORUS: Phosphorus: 4 mg/dL (ref 2.5–4.6)

## 2021-01-31 LAB — MAGNESIUM: Magnesium: 1.9 mg/dL (ref 1.7–2.4)

## 2021-01-31 MED ORDER — SODIUM CHLORIDE 0.9 % IV BOLUS
500.0000 mL | Freq: Once | INTRAVENOUS | Status: AC
  Administered 2021-01-31: 500 mL via INTRAVENOUS

## 2021-01-31 MED ORDER — SODIUM CHLORIDE 0.9 % IV SOLN: INTRAVENOUS | Status: DC

## 2021-01-31 NOTE — Evaluation (Addendum)
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 Was admitted for C. difficile colitis and for concern for postop wound infection.  Surgery was initially evaluated and signed off the case however medical oncology obtained a CT scan of the abdomen pelvis yesterday which showed that her abdominal abscess appears to be little bit larger and that she may have a fistula so IR and General Surgery were reconsulted.   Clinical Impression   On evaluation patient presents with generalized weakness and decreased activity tolerance. Patient also limited by orthostatic hypotension with standing with a BP of 80/53. Patient reports normally being able to to ambulate in room and perform toileting independently but has been dizzy with ambulation since yesterday. Patient is generally able to perform all tasks but needing min guard for safety. Patient's activity tolerance limited to in room ambulation and ADLs and then returning to supine position. Hasn't been tolerating seated position due to abdominal pain and pressure. Patient will benefit from skilled OT services to improve deficits while in hospital in order for patient to return home at discharge.    Follow Up Recommendations  No OT follow up    Equipment Recommendations  None  recommended by OT    Recommendations for Other Services       Precautions / Restrictions Precautions Precautions: Other (comment);Fall Precaution Comments: low abd wound & drain; reports history of unexplained syncope causing passing out (approx 4 x a year), orthostatic hypotension Restrictions Weight Bearing Restrictions: No      Mobility Bed Mobility Overal bed mobility: Modified Independent                  Transfers Overall transfer level: Needs assistance   Transfers: Sit to/from Stand Sit to Stand: Min guard         General transfer comment: Patient's BP monitored at side of bed. BP 126/86. With standing patient immedialtely reports dizziness. BP in standing 80/53.    Balance Overall balance assessment: Mild deficits observed, not formally tested                                         ADL either performed or assessed with clinical judgement   ADL Overall ADL's : Needs assistance/impaired Eating/Feeding: Independent   Grooming: Independent   Upper Body Bathing: Supervision/ safety   Lower Body Bathing: Supervison/ safety   Upper Body Dressing : Supervision/safety   Lower Body Dressing: Minimal assistance;Sit to/from stand Lower Body Dressing Details (indicate cue type and reason): limited by pain Toilet Transfer: Min guard   Toileting- Clothing Manipulation and Hygiene: Min guard               Vision Patient Visual Report: No change from baseline       Perception     Praxis      Pertinent Vitals/Pain Pain  Assessment: Faces Faces Pain Scale: Hurts little more Pain Location: woun site Pain Descriptors / Indicators: Grimacing Pain Intervention(s): Limited activity within patient's tolerance     Hand Dominance Right   Extremity/Trunk Assessment Upper Extremity Assessment Upper Extremity Assessment: Generalized weakness   Lower Extremity Assessment Lower Extremity Assessment: Generalized weakness   Cervical /  Trunk Assessment Cervical / Trunk Assessment: Normal   Communication Communication Communication: No difficulties   Cognition Arousal/Alertness: Awake/alert Behavior During Therapy: WFL for tasks assessed/performed Overall Cognitive Status: Within Functional Limits for tasks assessed                                     General Comments       Exercises     Shoulder Instructions      Home Living Family/patient expects to be discharged to:: Private residence Living Arrangements: Spouse/significant other Available Help at Discharge: Family Type of Home: House Home Access: Stairs to enter Technical brewer of Steps: 3 Entrance Stairs-Rails: Left Home Layout: One level     Bathroom Shower/Tub: Walk-in shower         Home Equipment: Clinical cytogeneticist - 2 wheels;Bedside commode          Prior Functioning/Environment Level of Independence: Needs assistance  Gait / Transfers Assistance Needed: independent ADL's / Homemaking Assistance Needed: a little help with LB dressing after surgery            OT Problem List: Decreased strength;Decreased activity tolerance;Pain      OT Treatment/Interventions: Self-care/ADL training;Therapeutic exercise;Energy conservation;DME and/or AE instruction;Patient/family education;Therapeutic activities    OT Goals(Current goals can be found in the care plan section) Acute Rehab OT Goals Patient Stated Goal: improve strength and endurance OT Goal Formulation: With patient Time For Goal Achievement: 02/14/21 Potential to Achieve Goals: Good  OT Frequency: Min 2X/week   Barriers to D/C:            Co-evaluation              AM-PAC OT "6 Clicks" Daily Activity     Outcome Measure Help from another person eating meals?: None Help from another person taking care of personal grooming?: None Help from another person toileting, which includes using toliet, bedpan, or urinal?: A Little Help from another  person bathing (including washing, rinsing, drying)?: A Little Help from another person to put on and taking off regular upper body clothing?: A Little Help from another person to put on and taking off regular lower body clothing?: A Little 6 Click Score: 20   End of Session Nurse Communication: Mobility status  Activity Tolerance: Patient tolerated treatment well Patient left: in bed;with call bell/phone within reach;with family/visitor present  OT Visit Diagnosis: Muscle weakness (generalized) (M62.81);Pain                Time: 3300-7622 OT Time Calculation (min): 20 min Charges:  OT General Charges $OT Visit: 1 Visit OT Evaluation $OT Eval Low Complexity: 1 Low  Britania Shreeve, OTR/[facility]  Office [phone] Pager: [place] 01/31/2021, 12:48 PM

## 2021-01-31 NOTE — Progress Notes (Signed)
PT Cancellation Note  Patient Details Name: Wendy Barber MRN: [id number] DOB: 21-Dec-1960   Cancelled Treatment:     PT order received but eval deferred this am.  Pt orthostatic with attempts at [place] with OT.  Will follow.   [name] 01/31/2021, 12:41 PM

## 2021-01-31 NOTE — Progress Notes (Signed)
Subjective/Chief Complaint: Feels well today   Objective: Vital signs in last 24 hours: Temp:  [98.5 F (36.9 C)-98.7 F (37.1 C)] 98.5 F (36.9 C) (04/10 0617) Pulse Rate:  [74-80] 74 (04/10 0617) Resp:  [17-18] 17 (04/10 0617) BP: (107-134)/(61-77) 134/72 (04/10 0617) SpO2:  [90 %-91 %] 90 % (04/10 0617) Last BM Date: 01/29/21  Intake/Output from previous day: 04/09 0701 - 04/10 0700 In: 365 [P.O.:120] Out: 210 [Drains:210] Intake/Output this shift: No intake/output data recorded.  Ab drain more clear, it does not appear to be enteric, soft , indision clean  Lab Results:  Recent Labs    01/30/21 0531 01/31/21 0607  WBC 7.8 8.8  HGB 10.7* 10.6*  HCT 34.7* 35.2*  PLT 417* 415*   BMET Recent Labs    01/30/21 0531 01/31/21 0607  NA 141 137  K 4.3 3.8  CL 104 103  CO2 28 25  GLUCOSE 116* 118*  BUN 9 11  CREATININE 0.53 0.64  CALCIUM 9.0 8.7*   PT/INR No results for input(s): LABPROT, INR in the last 72 hours. ABG No results for input(s): PHART, HCO3 in the last 72 hours.  Invalid input(s): PCO2, PO2  Studies/Results: No results found.  Anti-infectives: Anti-infectives (From admission, onward)   Start     Dose/Rate Route Frequency Ordered Stop   01/30/21 1000  vancomycin (VANCOCIN) 50 mg/mL oral solution 125 mg        125 mg Oral Every 12 hours 01/29/21 1431     01/29/21 2200  metroNIDAZOLE (FLAGYL) tablet 500 mg        500 mg Oral Every 12 hours 01/29/21 1700     01/29/21 1800  metroNIDAZOLE (FLAGYL) tablet 500 mg  Status:  Discontinued        500 mg Oral Every 8 hours 01/29/21 1431 01/29/21 1700   01/29/21 1530  cefdinir (OMNICEF) capsule 300 mg        300 mg Oral Every 12 hours 01/29/21 1431     01/28/21 1800  metroNIDAZOLE (FLAGYL) IVPB 500 mg  Status:  Discontinued        500 mg 100 mL/hr over 60 Minutes Intravenous Every 8 hours 01/28/21 1517 01/29/21 1431   01/28/21 1600  ciprofloxacin (CIPRO) IVPB 400 mg  Status:  Discontinued         400 mg 200 mL/hr over 60 Minutes Intravenous Every 12 hours 01/28/21 1517 01/29/21 1431   01/23/21 1315  vancomycin (VANCOCIN) 50 mg/mL oral solution 125 mg        125 mg Oral Every 6 hours 01/23/21 1226 01/29/21 2359   01/23/21 1000  fidaxomicin (DIFICID) tablet 200 mg  Status:  Discontinued        200 mg Oral 2 times daily 01/23/21 0859 01/23/21 1226   01/17/21 0600  vancomycin (VANCOREADY) IVPB 1500 mg/300 mL  Status:  Discontinued        1,500 mg 150 mL/hr over 120 Minutes Intravenous Every 24 hours 01/16/21 1732 01/17/21 1216   01/16/21 2200  metroNIDAZOLE (FLAGYL) IVPB 500 mg  Status:  Discontinued        500 mg 100 mL/hr over 60 Minutes Intravenous Every 8 hours 01/16/21 1622 01/17/21 1216   01/16/21 2000  vancomycin (VANCOCIN) 50 mg/mL oral solution 125 mg  Status:  Discontinued        125 mg Oral 4 times daily 01/16/21 1805 01/23/21 0859   01/16/21 1800  ceFEPIme (MAXIPIME) 2 g in sodium chloride 0.9 % 100 mL IVPB  Status:  Discontinued        2 g 200 mL/hr over 30 Minutes Intravenous Every 8 hours 01/16/21 1728 01/17/21 1216   01/16/21 1545  vancomycin (VANCOCIN) 50 mg/mL oral solution 125 mg  Status:  Discontinued        125 mg Oral 4 times daily 01/16/21 1539 01/16/21 1622   01/16/21 1445  metroNIDAZOLE (FLAGYL) IVPB 500 mg        500 mg 100 mL/hr over 60 Minutes Intravenous  Once 01/16/21 1438 01/16/21 1622   01/16/21 1330  vancomycin (VANCOCIN) IVPB 1000 mg/200 mL premix        1,000 mg 200 mL/hr over 60 Minutes Intravenous  Once 01/16/21 1320 01/16/21 1538      Assessment/Plan: Metastatic appendiceal cancer, pseudomyxoma peritonei and peritoneal carcinomatosis -S/PCRS/HIPECand splenectomy in 2005 -Recurrent cancer S/PHIPECand debulkingon 12/10/2020 in [place];complicated by enterotomy requiring return to the OR on postop day 4 and then again for closure of open midline wound -Postop abdominal wall fluid collection and deep pelvic collectionon CT - repeat CT  scan 4/5 showed slight interval increase in size of the anterior abdominal wall abscess which appears to communicate with a smaller intra-abdominal abscess; findings worrisome for a fistula; persistent fluid collection in the pelvis just above the bladder anteriorly, this could be a liquified hematoma or postoperative seroma - s/p IR drain placement 4/6 in abdominal wall fluid collection, output is not frank stool but itwas initiallydark/thickwithsediment concerning for possible leak >> cx pending:FEW KLEBSIELLA PNEUMONIAE -there is zero reason she needs surgery now.  Wbc is still normal.  She really needs repeat ct scan and drain study.I would  Recommend IR evaluation tomorrow for a drain study to determine whether or not there actually is fistula. It does not appear that is case based on drain output now and treatment for fistula vs IAA is different.  Will await study -on abx per ID, I do not think she needs to be on the duration mentioned as long as there is source control, I do not see any positive blood cultures either  C diff colitis -on PO vancomycin, diarrhea improving  ID -PO vancomycin, cipro/flagyl 4/7>> FEN -HH/CM diet VTE -lovenox Foley -none Dispo- follow up [facility] 01/31/2021

## 2021-01-31 NOTE — Progress Notes (Signed)
PROGRESS NOTE    Wendy Barber  ZDG:[id number] DOB: Jan 16, 1961 DOA: 01/16/2021 PCP: Glendon Axe, MD  Brief Narrative:  The patient is a 60 year old Caucasian female with a past medical history significant for but not limited to metastatic appendiceal cancer, pseudomyxoma peritonei and peritoneal carcinomatosis status post CRS/HIPEC and splenectomy in 2005 and recent CRS/HIPEC on 12/10/2020 for recurrent cancer, autoimmune hepatitis, DVT and PE with IVC filter currently on Lovenox, hypertension, diabetes mellitus type 2 as well as other comorbidities who presented to the ED with multiple complaints including intractable diarrhea since her recent procedure and fatigue as well as wound drainage.  Since her last hospitalization February she has had persistent diarrhea but has negative for C. difficile started on Imodium and Lomotil with some improvement but recently seen by her oncologist in [place] on 316 and states that she is started on antibiotics at that time.  Since then her diarrhea has worsened and she is more fatigued and has had a decreased appetite with increased abdominal pain.  She went to the bathroom and had an area of open incision with significant amount of purulent foul-smelling drainage.  Was admitted for C. difficile colitis and for concern for postop wound infection.  Surgery was initially evaluated and signed off the case however medical oncology obtained a CT scan of the abdomen pelvis yesterday which showed that her abdominal abscess appears to be little bit larger and that she may have a fistula so IR and General Surgery were reconsulted.   Interventional radiology placed a 10 French drain in her abscess on 01/27/21 and it yielded 65 cc of amber-colored fluid.  Her drain was connected to gravity bag and was sent off for culture and is growing out Klebsiella pneumoniae so ID is recommending Cipro/Flagyl given her PCN Allergy.  General Surgery was reconsulted and they evaluated the  patient and feel that her fistula is related to her surgery in [place] in February.  Dr. Harlow Asa evaluated recommends continuing the drain and having the patient follow-up in the IR drain clinic in 10 to 14 days and may need a drain injection to evaluate for a fistula.  Interventional radiology recommends continuing current drain management with every shift flushes and monitoring of output and planning repeat CT scan and possible drain injection when output is less than 10 cc a day to assess for possible removal.  Patient's pain seems controlled today but she still felt a little nauseous.  Diarrhea has improved and she is completed her antibiotics for C. difficile however has been started on IV ciprofloxacin and IV Flagyl for the Klebsiella growing from her abdominal abscess and white count slowly trended up.  Oncology continues her on a fentanyl patch and alternating her on p.o. Dilaudid and IV.  She also received a dose of Feraheme on 01/28/21.  General surgery recommending continuing drain management and continue to monitor output and patient is to be arranged follow-up with the IR drain clinic and follow-up with her surgeon in [place]  ID has re-evaluated and recommending Vancomycin 50 mg po BID for the duration of systemic Abx + 1 week beyond and they are also recommending transitioning po Metronidazole 500 mg po BID and Cefdinir 300 mg po BID x 6 weeks until 03/12/21. General Surgery recommending repeating CT scan and drain study tomorrow 02/01/21. She is slowly improving and not as nauseous but continues to have abdominal pain.   Assessment & Plan:   Principal Problem:   Surgical wound infection Active Problems:   Autoimmune hepatitis (  [place])   DM2 (diabetes mellitus, type 2) ([place])   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)   Malignant pseudomyxoma peritonei ([place])   DVT of deep femoral vein, left ([place])   Pulmonary embolism, bilateral ([facility])   Presence of IVC filter   C.  difficile colitis   Abdominal wall abscess   Hypokalemia  Abdominal postsurgical Wound Infection and Associated Abdominal Abscess status post midline lower abdominal drain placement by interventional radiology on 01/27/2021 -Patient presented with concerns for abdominal postsurgical wound infection. -Patient afebrile on room air, no leukocytosis. -Status post cytoreductive surgery/HI PEC 12/10/2020 at [facility] in [place], [place] with major reduction of small bowel, proximal jejunum, ascending colon, partial cystectomy. -Postop course for anastomotic leak and taken back to the OR on POD #13 with closure of abdominal wound on POD #16. -Status post JP drain recently removed 01/06/2021. -Initial CT abdomen and pelvis done concerning for anterior abdominal wall ventral surgical wound collection three, 3.2 x 1.8 x 13 cm concerning for infection as well as additional loculated fluid collection in the pelvis superior and anterior to the urinary bladder measuring 5.5 x 2.6 x 3.8 cm. -Asplenic. -General surgery following and suspected superficial wound seroma in abdominal wall, recommend wound packing daily, suspect fluid collection is sterile fluid collection is sterile. -Initial Wound cultures with few actinobacter species, rare Candida albicans. -Discussed with general surgery about wound culture results and they Did not feel she an active infection at this time but now she is growing Klebsiella from Drain Abscess and ID was reconsulted and recommended antibiotics and now change the patient to p.o. metronidazole p.o. Cefdinir and recommending antibiotics -Continued to Monitor leukocytosis and is slowly trending up and if worsening may need to add fluconazole but will defer to ID; ID recommending Cipro/Flagyl given Klebsiella from Drain Cx and recommending changing to p.o. cefdinir and p.o. Flagyl -Pain management was adjusted by medical oncology and may need to adjust given that nursing staff feels  the patient is is over utilizing the pain control -Saline lock IV fluids.   -WBC has trended up from 8.8 -> 10.4 -> 10.7 -> 7.8 -> 8.8 -Repeat CT abdomen and pelvis ordered per hematology/oncology on 01/26/21 for further evaluation of patient's abdominal pain and showed "Persistent inflammatory type changes involving the colon and small bowel. Slight interval increase in size of the anterior abdominal wall abscess which appears to communicate with a smaller intra-abdominal abscess. Findings worrisome for a fistula. Persistent  Fluid collection in the pelvis just above the bladder anteriorly. This could be a liquified hematoma or postoperative seroma. 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 This may account for some of the colonic inflammation which could be due to vascular congestion. -IR also reconsulted and patient to underwent CT-guided drainage procedure for abdominal wall abscess -Interventional radiology placed a 10 French drain in her abscess on 01/27/21 and it yielded 65 cc of amber-colored fluid.  Her drain was connected to gravity bag and was sent off for culture and is growing out Klebsiella pneumoniae and Sensitivities only show Resistance to Ampicillin; so ID is recommending Cipro/Flagyl given her PCN Allergy.   -General Surgery was reconsulted they feel the fistula was related to her surgery in [place] and they are recommending repeating CT scan and possible drain injection Tomorrow 02/01/21; IR was recommending to repeat when output is less than 10 cc a day to assess for possible removal. -Continue drain management per interventional radiology and  surgical recommendation to monitor output; patient will need follow-up with her surgeon in [place] when stable to D/C   C. difficile Enteritis and Diarrhea; improving  -Patient with multiple loose stools, which seems to be slowing down after Lomotil given last night.   -Patient drank contrast with CT  abdomen and pelvis and stated started to have loose stool again.  -Patient describing stool with some mucus noted in. -Afebrile.   -Continue oral vancomycin, probiotic and states diarrhea is slowing down  -Creon added per oncology due to concern for possible of short-bowel with respect to diarrhea as patient had told oncologist has a lot of stool that floats concerning for fatty content.  -Patient complained of abdominal pain since starting Creon and requested Creon to be discontinued which it has.   -Patient stated some improvement with abdominal pain after Creon discontinued.   -PPI discontinued patient started on Pepcid.  -A dose of Dificid was given the morning of 01/23/2021, however patient wants to continue oral vancomycin for now as she feels number of stools slowly decreasing. -CT abdomen and pelvis ordered per Oncology for further evaluation of patient's abdominal pain. -Continue oral vancomycin to complete a 14-day course on 01/29/2021. -ID was consulted, see recommendations and they are recommending agreeing with supportive care (Florastor and Polycarbophil) and recommending the patient finish a total of 14 days of p.o. vancomycin with the last day being 01/30/2019.  She is not on systemic antibiotics given her drain culture -She continues to have some abdominal pain so was on pain control with 2 mg of p.o. hydromorphone every 4 hours as needed for severe pain; Now medical oncology has increased this to 1 mg every 2 hours as needed severe pain and 2 mg p.o. every 2 as needed for severe pain and will defer to them to them for Pain Control; pain is still there but is improving  Metastatic appendiceal cancer, pseudomyxoma peritonei and peritoneal carcinomatosis status post CRS/HI PEC and splenectomy in 2005 and recently CRS/HI PEC on 12/10/2020 for recurrent cancer -Medical Oncology consulted and appreciate further evaluation and Recommendations -Will need to follow up at D/C with Surgeon in  [facility] or Specialist at [facility]  History of DVT and PE status post IVC filter -Continue home regimen prophylactic Lovenox 40 mg sq q24h but may need to escalate given 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 daily with breakfast, 5 mg p.o. daily, and then 10 mg p.o. nightly.  Type 2 Diabetes Mellitus  -Hemoglobin A1c 5.8 (3/26) -Diet controlled. -Blood Sugars ranging from 105-122 on daily BMP/CMP; Repeat Blood Sugar was 118 -If necessary will place on sensitive NovoLog sliding scale insulin AC  Hypokalemia -Secondary to GI losses.   -Potassium is improved to 3.8 -Magnesium at 1.9 -Continue to Monitor and Trend -Repeat CMP in the AM   Autoimmune Hepatitis -Continue to hold azathioprine and could likely resume once antibiotic course has been completed with clinical improvement.  -LFTs currently normal with an AST of 35 and ALT of 14 -Continue to monitor and trend and repeat CMP in a.m.  Normocytic Anemia -Patient's hemoglobin/hematocrit is relatively stable at 10.7/34.7 with an MCV of 99.4 -Checked Anemia Panel and  showed an iron level of 27, U IBC 273, TIBC of 300, saturation ratios of 9%, ferritin level of 76, folate level 15.5, vitamin B12 240 -She received a dose of Feraheme during this hospitalization  -Continue to monitor for signs and symptoms bleeding; currently no overt bleeding noted -Repeat CBC in a.m.  Thrombocytosis -Patient's Platelet Count went from 310 -> 347 -> 392 -> 408 -> 383 -> 417 -> 415 -Continue to Monitor and Trend -Repeat CBC in the AM   Dizziness -Patient states that she is dizzy upon standing -We will check orthostatic  vital signs -Give a 500 mL bolus -Placed on maintenance IV fluids at 75 mils per hour -Obtain TED hose  -PT OT may need to reevaluate for vestibular testing  DVT prophylaxis: Enoxaparin 40 mg sq q24h Code Status: FULL CODE  Family Communication: Discussed with Husband at bedside  Disposition Plan: Pending further clinical improvement and clearance by infectious diseases, medical oncology, general surgery and interventional radiology  Status is: Inpatient  Remains inpatient appropriate because:Unsafe d/c plan, IV treatments appropriate due to intensity of illness or inability to take PO and Inpatient level of care appropriate due to severity of illness  Dispo: The patient is from: Home              Anticipated d/c is to: TBD              Patient currently is not medically stable to d/c.   Difficult to place patient No  Consultants:   Medical Oncology  General Surgery  Interventional Radiology  Infectious Diseases    Procedures:   CT abdomen and pelvis 01/16/2021  CT abdomen and pelvis 01/26/2021  CT guided drainage procedure of abdominal wall abscess 01/27/21  Antimicrobials:  Anti-infectives (From admission, onward)   Start     Dose/Rate Route Frequency Ordered Stop   01/30/21 1000  vancomycin (VANCOCIN) 50 mg/mL oral solution 125 mg        125 mg Oral Every 12 hours 01/29/21 1431     01/29/21 2200  metroNIDAZOLE (FLAGYL) tablet 500 mg        500 mg Oral Every 12 hours 01/29/21 1700     01/29/21 1800  metroNIDAZOLE (FLAGYL) tablet 500 mg  Status:  Discontinued        500 mg Oral Every 8 hours 01/29/21 1431 01/29/21 1700   01/29/21 1530  cefdinir (OMNICEF) capsule 300 mg        300 mg Oral Every 12 hours 01/29/21 1431     01/28/21 1800  metroNIDAZOLE (FLAGYL) IVPB 500 mg  Status:  Discontinued        500 mg 100 mL/hr over 60 Minutes Intravenous Every 8 hours 01/28/21 1517 01/29/21 1431   01/28/21 1600  ciprofloxacin (CIPRO) IVPB 400 mg  Status:  Discontinued        400  mg 200 mL/hr over 60 Minutes Intravenous Every 12 hours 01/28/21 1517 01/29/21 1431   01/23/21 1315  vancomycin (VANCOCIN) 50 mg/mL oral solution 125 mg        125 mg Oral Every 6 hours 01/23/21 1226 01/29/21 2359   01/23/21 1000  fidaxomicin (DIFICID) tablet 200 mg  Status:  Discontinued        200 mg Oral 2 times daily 01/23/21 0859 01/23/21 1226   01/17/21 0600  vancomycin (VANCOREADY) IVPB 1500 mg/300 mL  Status:  Discontinued        1,500 mg 150 mL/hr over 120 Minutes Intravenous Every 24 hours 01/16/21  1732 01/17/21 1216   01/16/21 2200  metroNIDAZOLE (FLAGYL) IVPB 500 mg  Status:  Discontinued        500 mg 100 mL/hr over 60 Minutes Intravenous Every 8 hours 01/16/21 1622 01/17/21 1216   01/16/21 2000  vancomycin (VANCOCIN) 50 mg/mL oral solution 125 mg  Status:  Discontinued        125 mg Oral 4 times daily 01/16/21 1805 01/23/21 0859   01/16/21 1800  ceFEPIme (MAXIPIME) 2 g in sodium chloride 0.9 % 100 mL IVPB  Status:  Discontinued        2 g 200 mL/hr over 30 Minutes Intravenous Every 8 hours 01/16/21 1728 01/17/21 1216   01/16/21 1545  vancomycin (VANCOCIN) 50 mg/mL oral solution 125 mg  Status:  Discontinued        125 mg Oral 4 times daily 01/16/21 1539 01/16/21 1622   01/16/21 1445  metroNIDAZOLE (FLAGYL) IVPB 500 mg        500 mg 100 mL/hr over 60 Minutes Intravenous  Once 01/16/21 1438 01/16/21 1622   01/16/21 1330  vancomycin (VANCOCIN) IVPB 1000 mg/200 mL premix        1,000 mg 200 mL/hr over 60 Minutes Intravenous  Once 01/16/21 1320 01/16/21 1538        Subjective: Patient was seen and examined at bedside and thinks her diarrhea is improving and her nausea is better but still continues to have some abdominal pain.  Describes it as sharp spots in her abdomen.  No lightheadedness or dizziness.  Gets very dizzy upon standing so we will check orthostatics and give fluids.   Objective: Vitals:   01/30/21 1418 01/30/21 2053 01/31/21 0617 01/31/21 1332  BP: 132/77  107/61 134/72 120/84  Pulse: 78 80 74 76  Resp: 18 18 17 18   Temp: 98.7 F (37.1 C) 98.6 F (37 C) 98.5 F (36.9 C)   TempSrc: Oral Oral Oral   SpO2: 91% 91% 90% 93%  Weight:      Height:        Intake/Output Summary (Last 24 hours) at 01/31/2021 1849 Last data filed at 01/31/2021 0600 Gross per 24 hour  Intake 240 ml  Output 10 ml  Net 230 ml   Filed Weights   01/16/21 1700  Weight: 102.1 kg   Examination: Physical Exam:  Constitutional: WN/WD chronically ill-appearing obese Caucasian female currently no acute distress appears calm but slightly uncomfortable Eyes: Lids and conjunctivae normal, sclerae anicteric  ENMT: External Ears, Nose appear normal. Grossly normal hearing. Neck: Appears normal, supple, no cervical masses, normal ROM, no appreciable thyromegaly; no JVD Respiratory: Diminished to auscultation bilaterally with coarse breath sounds, no wheezing, rales, rhonchi or crackles. Normal respiratory effort and patient is not tachypenic. No accessory muscle use.  Unlabored breathing Cardiovascular: RRR, no murmurs / rubs / gallops. S1 and S2 auscultated.  Trace extremity edema Abdomen: Soft, distended secondary by habitus, tender to palpate.  Has an abdominal drain in place. bowel sounds positive.  GU: Deferred. Musculoskeletal: No clubbing / cyanosis of digits/nails. No joint deformity upper and lower extremities.   Skin: No rashes, lesions, ulcers on limited skin evaluation. No induration; Warm and dry.  Neurologic: CN 2-12 grossly intact with no focal deficits. Romberg sign and cerebellar reflexes not assessed.  Psychiatric: Normal judgment and insight. Alert and oriented x 3. Normal mood and appropriate affect.   Data Reviewed: I have personally reviewed following labs and imaging studies  CBC: Recent Labs  Lab 01/27/21 0459 01/28/21 0453  01/29/21 0601 01/30/21 0531 01/31/21 0607  WBC 8.8 10.4 10.7* 7.8 8.8  NEUTROABS 3.4 4.6 4.5 2.2 2.9  HGB 10.0* 10.7*  10.6* 10.7* 10.6*  HCT 31.9* 35.0* 34.1* 34.7* 35.2*  MCV 98.8 100.9* 100.3* 99.4 101.4*  PLT 392 408* 383 417* 716*   Basic Metabolic Panel: Recent Labs  Lab 01/25/21 0542 01/26/21 0557 01/27/21 0459 01/28/21 0453 01/29/21 0601 01/30/21 0531 01/31/21 0607  NA 139 140 140 139 136 141 137  K 3.5 3.8 3.9 3.4* 4.0 4.3 3.8  CL 105 105 105 103 103 104 103  CO2 28 28 26 26 24 28 25   GLUCOSE 112* 122* 112* 109* 109* 116* 118*  BUN 11 9 9 10 10 9 11   CREATININE 0.41* 0.45 0.57 0.48 0.54 0.53 0.64  CALCIUM 8.4* 8.6* 8.5* 8.6* 8.9 9.0 8.7*  MG 1.8 2.0  --  1.9 1.9 2.0 1.9  PHOS 4.0  --   --  3.8 3.4 3.7 4.0   GFR: Estimated Creatinine Clearance: 92.4 mL/min (by C-G formula based on SCr of 0.64 mg/dL). Liver Function Tests: Recent Labs  Lab 01/27/21 0459 01/28/21 0453 01/29/21 0601 01/30/21 0531 01/31/21 0607  AST 35 32 30 29 35  ALT 14 18 16 14 14   ALKPHOS 263* 275* 261* 265* 269*  BILITOT 0.6 0.7 0.8 0.6 0.7  PROT 6.4* 6.7 6.5 6.9 6.7  ALBUMIN 2.2* 2.3* 2.3* 2.4* 2.4*   No results for input(s): LIPASE, AMYLASE in the last 168 hours. No results for input(s): AMMONIA in the last 168 hours. Coagulation Profile: No results for input(s): INR, PROTIME in the last 168 hours. Cardiac Enzymes: No results for input(s): CKTOTAL, CKMB, CKMBINDEX, TROPONINI in the last 168 hours. BNP (last 3 results) No results for input(s): PROBNP in the last 8760 hours. HbA1C: No results for input(s): HGBA1C in the last 72 hours. CBG: No results for input(s): GLUCAP in the last 168 hours. Lipid Profile: No results for input(s): CHOL, HDL, LDLCALC, TRIG, CHOLHDL, LDLDIRECT in the last 72 hours. Thyroid Function Tests: No results for input(s): TSH, T4TOTAL, FREET4, T3FREE, THYROIDAB in the last 72 hours. Anemia Panel: No results for input(s): VITAMINB12, FOLATE, FERRITIN, TIBC, IRON, RETICCTPCT in the last 72 hours. Sepsis Labs: No results for input(s): PROCALCITON, LATICACIDVEN in the last  168 hours.  Recent Results (from the past 240 hour(s))  Aerobic/Anaerobic Culture (surgical/deep wound)     Status: None (Preliminary result)   Collection Time: 01/27/21  3:47 PM   Specimen: Abscess  Result Value Ref Range Status   Specimen Description   Final    ABSCESS POST CT GUIDED DRAIN Performed at [facility] [address], [place], [place] [address]    Special Requests   Final    NONE Performed at [facility], [place] [address], [place], [place] [address]    Gram Stain   Final    ABUNDANT WBC PRESENT, PREDOMINANTLY PMN NO ORGANISMS SEEN Performed at [facility], [place] [address] [place],  [address]    Culture   Final    FEW KLEBSIELLA PNEUMONIAE NO ANAEROBES ISOLATED; CULTURE IN PROGRESS FOR 5 DAYS    Report Status PENDING  Incomplete   Organism ID, Bacteria KLEBSIELLA PNEUMONIAE  Final      Susceptibility   Klebsiella pneumoniae - MIC*    AMPICILLIN >=32 RESISTANT Resistant     CEFAZOLIN <=4 SENSITIVE Sensitive     CEFEPIME <=0.12 SENSITIVE Sensitive     CEFTAZIDIME <=1  SENSITIVE Sensitive     CEFTRIAXONE <=0.25 SENSITIVE Sensitive     CIPROFLOXACIN <=0.25 SENSITIVE Sensitive     GENTAMICIN <=1 SENSITIVE Sensitive     IMIPENEM <=0.25 SENSITIVE Sensitive     TRIMETH/SULFA <=20 SENSITIVE Sensitive     AMPICILLIN/SULBACTAM 4 SENSITIVE Sensitive     PIP/TAZO <=4 SENSITIVE Sensitive     * FEW KLEBSIELLA PNEUMONIAE     RN Pressure Injury Documentation:     Estimated body mass index is 36.32 kg/m as calculated from the following:   Height as of this encounter: 5\' 6"  (1.676 m).   Weight as of this encounter: 102.1 kg.  Malnutrition Type:  Nutrition Problem: Increased nutrient needs Etiology: cancer and cancer related treatments   Malnutrition Characteristics:  Signs/Symptoms: estimated needs   Nutrition Interventions:  Interventions: Juven,MVI,Premier Protein   Radiology Studies: No results  found. Scheduled Meds: . amLODipine  10 mg Oral Daily  . busPIRone  5 mg Oral Q breakfast   And  . busPIRone  5 mg Oral Q1200   And  . busPIRone  10 mg Oral QHS  . cefdinir  300 mg Oral Q12H  . enoxaparin  40 mg Subcutaneous Q24H  . famotidine  40 mg Oral Daily  . fentaNYL  1 patch Transdermal Q72H  . Gerhardt's butt cream   Topical QID  . metroNIDAZOLE  500 mg Oral Q12H  . multivitamin with minerals  1 tablet Oral Daily  . polycarbophil  625 mg Oral Daily  . propranolol  20 mg Oral BID  . saccharomyces boulardii  250 mg Oral BID  . scopolamine  1 patch Transdermal Q72H  . sodium chloride flush  3 mL Intravenous Q12H  . sodium chloride flush  5 mL Intracatheter Q8H  . vancomycin  125 mg Oral Q12H   Continuous Infusions:   LOS: 15 days   Kerney Elbe, DO [facility] PAGER is on AMION  If 7PM-7AM, please contact night-coverage [email or web address]

## 2021-02-01 ENCOUNTER — Inpatient Hospital Stay (HOSPITAL_COMMUNITY): Payer: Medicare Other

## 2021-02-01 ENCOUNTER — Encounter (HOSPITAL_COMMUNITY): Payer: Self-pay | Admitting: Internal Medicine

## 2021-02-01 DIAGNOSIS — T8149XA Infection following a procedure, other surgical site, initial encounter: Secondary | ICD-10-CM | POA: Diagnosis not present

## 2021-02-01 DIAGNOSIS — L02211 Cutaneous abscess of abdominal wall: Secondary | ICD-10-CM | POA: Diagnosis not present

## 2021-02-01 DIAGNOSIS — K754 Autoimmune hepatitis: Secondary | ICD-10-CM | POA: Diagnosis not present

## 2021-02-01 DIAGNOSIS — A0472 Enterocolitis due to Clostridium difficile, not specified as recurrent: Secondary | ICD-10-CM | POA: Diagnosis not present

## 2021-02-01 LAB — CBC WITH DIFFERENTIAL/PLATELET
Abs Immature Granulocytes: 0.05 10*3/uL (ref 0.00–0.07)
Basophils Absolute: 0.1 10*3/uL (ref 0.0–0.1)
Basophils Relative: 1 %
Eosinophils Absolute: 0.2 10*3/uL (ref 0.0–0.5)
Eosinophils Relative: 2 %
HCT: 33.1 % — ABNORMAL LOW (ref 36.0–46.0)
Hemoglobin: 10.4 g/dL — ABNORMAL LOW (ref 12.0–15.0)
Immature Granulocytes: 1 %
Lymphocytes Relative: 50 %
Lymphs Abs: 4.7 10*3/uL — ABNORMAL HIGH (ref 0.7–4.0)
MCH: 31.4 pg (ref 26.0–34.0)
MCHC: 31.4 g/dL (ref 30.0–36.0)
MCV: 100 fL (ref 80.0–100.0)
Monocytes Absolute: 1.4 10*3/uL — ABNORMAL HIGH (ref 0.1–1.0)
Monocytes Relative: 15 %
Neutro Abs: 2.9 10*3/uL (ref 1.7–7.7)
Neutrophils Relative %: 31 %
Platelets: 394 10*3/uL (ref 150–400)
RBC: 3.31 MIL/uL — ABNORMAL LOW (ref 3.87–5.11)
RDW: 19.1 % — ABNORMAL HIGH (ref 11.5–15.5)
WBC: 9.4 10*3/uL (ref 4.0–10.5)
nRBC: 0 % (ref 0.0–0.2)

## 2021-02-01 LAB — COMPREHENSIVE METABOLIC PANEL
ALT: 13 U/L (ref 0–44)
AST: 26 U/L (ref 15–41)
Albumin: 2.3 g/dL — ABNORMAL LOW (ref 3.5–5.0)
Alkaline Phosphatase: 277 U/L — ABNORMAL HIGH (ref 38–126)
Anion gap: 6 (ref 5–15)
BUN: 10 mg/dL (ref 6–20)
CO2: 25 mmol/L (ref 22–32)
Calcium: 8.4 mg/dL — ABNORMAL LOW (ref 8.9–10.3)
Chloride: 107 mmol/L (ref 98–111)
Creatinine, Ser: 0.59 mg/dL (ref 0.44–1.00)
GFR, Estimated: >60 mL/min (ref >60)
Glucose, Bld: 117 mg/dL — ABNORMAL HIGH (ref 70–99)
Potassium: 3.6 mmol/L (ref 3.5–5.1)
Sodium: 138 mmol/L (ref 135–145)
Total Bilirubin: 0.6 mg/dL (ref 0.3–1.2)
Total Protein: 6.4 g/dL — ABNORMAL LOW (ref 6.5–8.1)

## 2021-02-01 LAB — PHOSPHORUS: Phosphorus: 3.6 mg/dL (ref 2.5–4.6)

## 2021-02-01 LAB — MAGNESIUM: Magnesium: 1.8 mg/dL (ref 1.7–2.4)

## 2021-02-01 MED ORDER — SODIUM CHLORIDE 0.9 % IV BOLUS
1000.0000 mL | Freq: Once | INTRAVENOUS | Status: AC
  Administered 2021-02-01: 1000 mL via INTRAVENOUS

## 2021-02-01 MED ORDER — ENOXAPARIN SODIUM 40 MG/0.4ML ~~LOC~~ SOLN: 40.0000 mg | SUBCUTANEOUS | Status: DC

## 2021-02-01 MED ORDER — IOHEXOL 300 MG/ML  SOLN
100.0000 mL | Freq: Once | INTRAMUSCULAR | Status: AC | PRN
  Administered 2021-02-01: 100 mL via INTRAVENOUS

## 2021-02-01 MED ORDER — METRONIDAZOLE IN NACL 5-0.79 MG/ML-% IV SOLN
500.0000 mg | Freq: Two times a day (BID) | INTRAVENOUS | Status: DC
  Administered 2021-02-01 – 2021-02-08 (×14): 500 mg via INTRAVENOUS
  Filled 2021-02-01 (×14): qty 100

## 2021-02-01 MED ORDER — RESOURCE INSTANT PROTEIN PO PWD PACKET
1.0000 | Freq: Three times a day (TID) | ORAL | Status: DC
  Filled 2021-02-01 (×13): qty 6

## 2021-02-01 MED ORDER — MAGNESIUM SULFATE 2 GM/50ML IV SOLN
2.0000 g | Freq: Once | INTRAVENOUS | Status: AC
  Administered 2021-02-01: 2 g via INTRAVENOUS
  Filled 2021-02-01: qty 50

## 2021-02-01 NOTE — Progress Notes (Signed)
Patient was scheduled for a drain injection.  Reviewed today's CT and evaluated drain output.  CT shows a persistent abdominal wall collection that has not been decompressed.  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  Patient reports persistent leakage around the drain and bandages were soaked when I examined it.  Based on the CT, patient will need the percutaneous drain pulled back or more likely will need a second drain placed within the anterior abdominal wall collection.   Discussed with patient and she is requesting moderate sedation for these procedures.  Plan for second drain placement and/or drain manipulation on 4/12.

## 2021-02-01 NOTE — Progress Notes (Signed)
Subjective/Chief Complaint: Ongoing mild, generalized abd pain. Tolerated breakfast this morning without nausea or emesis. No BM yet today. She is concerned about her orthostatic hypotension.   Objective: Vital signs in last 24 hours: Temp:  [98.3 F (36.8 C)-98.7 F (37.1 C)] 98.3 F (36.8 C) (04/11 0459) Pulse Rate:  [72-76] 72 (04/11 0459) Resp:  [14-20] 14 (04/11 0459) BP: (120-146)/(74-84) 146/82 (04/11 0459) SpO2:  [91 %-97 %] 91 % (04/11 0459) Last BM Date: 01/31/21  Intake/Output from previous day: 04/10 0701 - 04/11 0700 In: 664.7 [P.O.:290; I.V.:364.7] Out: 200 [Drains:200] Intake/Output this shift: Total I/O In: 220 [P.O.:220] Out: -   Ab drain with enteric appearing contents (light brown/yellow, looks like small intestinal contents), 200 cc/24h, abdomen is soft, non-distended, appropriately tender. Laparotomy scar present  Lab Results:  Recent Labs    01/31/21 0607 02/01/21 0600  WBC 8.8 9.4  HGB 10.6* 10.4*  HCT 35.2* 33.1*  PLT 415* 394   BMET Recent Labs    01/31/21 0607 02/01/21 0600  NA 137 138  K 3.8 3.6  CL 103 107  CO2 25 25  GLUCOSE 118* 117*  BUN 11 10  CREATININE 0.64 0.59  CALCIUM 8.7* 8.4*   PT/INR No results for input(s): LABPROT, INR in the last 72 hours. ABG No results for input(s): PHART, HCO3 in the last 72 hours.  Invalid input(s): PCO2, PO2  Studies/Results: No results found.  Anti-infectives: Anti-infectives (From admission, onward)   Start     Dose/Rate Route Frequency Ordered Stop   01/30/21 1000  vancomycin (VANCOCIN) 50 mg/mL oral solution 125 mg        125 mg Oral Every 12 hours 01/29/21 1431     01/29/21 2200  metroNIDAZOLE (FLAGYL) tablet 500 mg        500 mg Oral Every 12 hours 01/29/21 1700     01/29/21 1800  metroNIDAZOLE (FLAGYL) tablet 500 mg  Status:  Discontinued        500 mg Oral Every 8 hours 01/29/21 1431 01/29/21 1700   01/29/21 1530  cefdinir (OMNICEF) capsule 300 mg        300 mg Oral  Every 12 hours 01/29/21 1431     01/28/21 1800  metroNIDAZOLE (FLAGYL) IVPB 500 mg  Status:  Discontinued        500 mg 100 mL/hr over 60 Minutes Intravenous Every 8 hours 01/28/21 1517 01/29/21 1431   01/28/21 1600  ciprofloxacin (CIPRO) IVPB 400 mg  Status:  Discontinued        400 mg 200 mL/hr over 60 Minutes Intravenous Every 12 hours 01/28/21 1517 01/29/21 1431   01/23/21 1315  vancomycin (VANCOCIN) 50 mg/mL oral solution 125 mg        125 mg Oral Every 6 hours 01/23/21 1226 01/29/21 2359   01/23/21 1000  fidaxomicin (DIFICID) tablet 200 mg  Status:  Discontinued        200 mg Oral 2 times daily 01/23/21 0859 01/23/21 1226   01/17/21 0600  vancomycin (VANCOREADY) IVPB 1500 mg/300 mL  Status:  Discontinued        1,500 mg 150 mL/hr over 120 Minutes Intravenous Every 24 hours 01/16/21 1732 01/17/21 1216   01/16/21 2200  metroNIDAZOLE (FLAGYL) IVPB 500 mg  Status:  Discontinued        500 mg 100 mL/hr over 60 Minutes Intravenous Every 8 hours 01/16/21 1622 01/17/21 1216   01/16/21 2000  vancomycin (VANCOCIN) 50 mg/mL oral solution 125 mg  Status:  Discontinued        125 mg Oral 4 times daily 01/16/21 1805 01/23/21 0859   01/16/21 1800  ceFEPIme (MAXIPIME) 2 g in sodium chloride 0.9 % 100 mL IVPB  Status:  Discontinued        2 g 200 mL/hr over 30 Minutes Intravenous Every 8 hours 01/16/21 1728 01/17/21 1216   01/16/21 1545  vancomycin (VANCOCIN) 50 mg/mL oral solution 125 mg  Status:  Discontinued        125 mg Oral 4 times daily 01/16/21 1539 01/16/21 1622   01/16/21 1445  metroNIDAZOLE (FLAGYL) IVPB 500 mg        500 mg 100 mL/hr over 60 Minutes Intravenous  Once 01/16/21 1438 01/16/21 1622   01/16/21 1330  vancomycin (VANCOCIN) IVPB 1000 mg/200 mL premix        1,000 mg 200 mL/hr over 60 Minutes Intravenous  Once 01/16/21 1320 01/16/21 1538      Assessment/Plan: Metastatic appendiceal cancer, pseudomyxoma peritonei and peritoneal carcinomatosis -S/PCRS/HIPECand splenectomy  in 2005 -Recurrent cancer S/PHIPECand debulkingon 12/10/2020 in [place];complicated by enterotomy requiring return to the OR on postop day 4 and then again for closure of open midline wound -Postop abdominal wall fluid collection and deep pelvic collectionon CT - repeat CT scan 4/5 showed slight interval increase in size of the anterior abdominal wall abscess which appears to communicate with a smaller intra-abdominal abscess; findings worrisome for a fistula; persistent fluid collection in the pelvis just above the bladder anteriorly, this could be a liquified hematoma or postoperative seroma - s/p IR drain placement 4/6 in abdominal wall fluid collection, output is not frank stool but itwas initiallydark/thickwithsediment concerning for possible leak >> cx pending:FEW KLEBSIELLA PNEUMONIAE -no role for emergent surgery at this time. Vitals stable, Wbc normal. Abscess vs fistula appears controlled with IR drain. 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 -on abx per ID, I do not think she needs to be on the duration mentioned as long as there is source control, I do not see any positive blood cultures either   C diff colitis -on PO vancomycin, diarrhea improving  ID -PO vancomycin, cipro/flagyl 4/7>> FEN -HH/CM diet VTE -lovenox Foley -none Dispo- follow up [facility] 02/01/2021

## 2021-02-01 NOTE — Progress Notes (Signed)
   02/01/21 2332  Provider Notification  Provider Name/Title Dwyane Dee MD  Date Provider Notified 02/01/21  Time Provider Notified 2332  Notification Type Page (Secure chat)  Notification Reason Other (Comment) (Patient concerned about not recieving a dose of Lovenox d/t IR procedure & the potential of not receiving a dose tomorrow d/t a second IR procedure. 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

## 2021-02-01 NOTE — Progress Notes (Addendum)
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  Since her last hospitalization February she has had persistent diarrhea but has negative for C. difficile started on Imodium and Lomotil with some improvement but recently seen by her oncologist in [place] on 316 and states that she is started on antibiotics at that time.  Since then her diarrhea has worsened and she is more fatigued and has had a decreased appetite with increased abdominal pain.  She went to the bathroom and had an area of open incision with significant amount of purulent foul-smelling drainage.  Was admitted for C. difficile colitis and for concern for postop wound infection.  Surgery was initially evaluated and signed off the case however medical oncology obtained a CT scan of the abdomen pelvis yesterday which showed that her abdominal abscess appears to be little bit larger and that she may have a fistula so IR and General Surgery were reconsulted.  Interventional radiology placed a 10 French drain in her abscess on 01/27/21 and it yielded 65 cc of amber-colored fluid.  Her drain was connected to gravity bag and was sent off for culture and is growing out Klebsiella pneumoniae so ID is recommending Cipro/Flagyl given her PCN Allergy.  General Surgery was reconsulted and they evaluated the  patient and feel that her fistula is related to her surgery in [place] in February.  Dr. Harlow Asa evaluated recommends continuing the drain and having the patient follow-up in the IR drain clinic in 10 to 14 days and may need a drain injection to evaluate for a fistula.  Interventional radiology recommends continuing current drain management with every shift flushes and monitoring of output and planning repeat CT scan and possible drain injection when output is less than 10 cc a day to assess for possible removal.  Patient's pain seems controlled today but she still felt a little nauseous.  Diarrhea has improved and she is completed her antibiotics for C. difficile however has been started on IV ciprofloxacin and IV Flagyl for the Klebsiella growing from her abdominal abscess and white count slowly trended up.  Oncology continues her on a fentanyl patch and alternating her on p.o. Dilaudid and IV.  She also received a dose of Feraheme on 01/28/21.  General surgery recommending continuing drain management and continue to monitor output and patient is to be arranged follow-up with the IR drain clinic and follow-up with her surgeon in [place]  ID has re-evaluated and recommending Vancomycin 50 mg po BID for the duration of systemic Abx + 1 week beyond and they are also recommending transitioning po Metronidazole 500 mg po BID and Cefdinir 300 mg po BID x 6 weeks until 03/12/21. General Surgery recommending repeating CT scan and drain study and this will be done day. She is slowly improving and not as nauseous but continues to have abdominal pain and now she feels dizzy whenever she stands up. She was ordered a 500 mL bolus yesterday  and started on MiVF and will be given a 1 Liter bolus today and Resume TED Hose.   Assessment & Plan:   Principal Problem:   Surgical wound infection Active Problems:   Autoimmune hepatitis ([place])   DM2 (diabetes mellitus, type 2) ([place])   Cancer of appendix metastatic to  intra-abdominal lymph node (H[facility])   Malignant pseudomyxoma peritonei ([place])   DVT of deep femoral vein, left ([place])   Pulmonary embolism, bilateral (HCC)   Presence of IVC filter   C. difficile colitis   Abdominal wall abscess   Hypokalemia  Abdominal postsurgical Wound Infection and Associated Abdominal Abscess status post midline lower abdominal drain placement by interventional radiology on 01/27/2021 -Patient presented with concerns for abdominal postsurgical wound infection. -Patient afebrile on room air, no leukocytosis. -Status post cytoreductive surgery/HI PEC 12/10/2020 at [facility] in [place], [place] with major reduction of small bowel, proximal jejunum, ascending colon, partial cystectomy. -Postop course for anastomotic leak and taken back to the OR on POD #13 with closure of abdominal wound on POD #16. -Status post JP drain recently removed 01/06/2021. -Initial CT abdomen and pelvis done concerning for anterior abdominal wall ventral surgical wound collection three, 3.2 x 1.8 x 13 cm concerning for infection as well as additional loculated fluid collection in the pelvis superior and anterior to the urinary bladder measuring 5.5 x 2.6 x 3.8 cm. -Asplenic. -General surgery following and suspected superficial wound seroma in abdominal wall, recommend wound packing daily, suspect fluid collection is sterile fluid collection is sterile. -Initial Wound cultures with few actinobacter species, rare Candida albicans. -Discussed with general surgery about wound culture results and they Did not feel she an active infection at this time but now she is growing Klebsiella from Drain Abscess and ID was reconsulted and recommended antibiotics and now change the patient to p.o. metronidazole p.o. Cefdinir and recommending antibiotics -Continued to Monitor leukocytosis and is slowly trending up and if worsening may need to add fluconazole but will defer to ID; ID recommending Cipro/Flagyl given  Klebsiella from Drain Cx and recommending changing to p.o. cefdinir and p.o. Flagyl -Pain management was adjusted by medical oncology and may need to adjust given that nursing staff feels the patient is is over utilizing the pain control -Saline lock IV fluids.   -WBC has trended up from 8.8 -> 10.4 -> 10.7 -> 7.8 -> 8.8 -> 9.4 -Repeat CT abdomen and pelvis ordered per hematology/oncology on 01/26/21 for further evaluation of patient's abdominal pain and showed "Persistent inflammatory type changes involving the colon and small bowel. Slight interval increase in size of the anterior abdominal wall abscess which appears to communicate with a smaller intra-abdominal abscess. Findings worrisome for a fistula. Persistent  Fluid collection in the pelvis just above the bladder anteriorly. This could be a liquified hematoma or postoperative seroma. 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 This may account for some of the colonic inflammation which could be due to vascular congestion. -IR also reconsulted and patient to underwent CT-guided drainage procedure for abdominal wall abscess -Interventional radiology placed a 10 French drain in her abscess on 01/27/21 and it yielded 65 cc of amber-colored fluid.  Her drain was connected to gravity bag and was sent off for culture and is growing out Klebsiella pneumoniae and Sensitivities only show Resistance to Ampicillin; so ID is recommending Cipro/Flagyl given her PCN Allergy.   -General Surgery was reconsulted they feel the fistula was related to her surgery in [place]  and they are recommending repeating CT scan and possible drain injection Today 02/01/21; IR was recommending to repeat when output is less than 10 cc a day to assess for possible removal. -Await Drain Study Results  -Continue drain management per interventional radiology and surgical recommendation to monitor output; patient will need follow-up with her surgeon in  [place] when stable to D/C   C. difficile Enteritis and Diarrhea; improving  -Patient with multiple loose stools, which seems to be slowing down after Lomotil given last night.   -Patient drank contrast with CT abdomen and pelvis and stated started to have loose stool again.  -Patient describing stool with some mucus noted in. -Afebrile.   -Continue oral vancomycin, probiotic and states diarrhea is slowing down  -Creon added per oncology due to concern for possible of short-bowel with respect to diarrhea as patient had told oncologist has a lot of stool that floats concerning for fatty content.  -Patient complained of abdominal pain since starting Creon and requested Creon to be discontinued which it has.   -Patient stated some improvement with abdominal pain after Creon discontinued.   -PPI discontinued patient started on Pepcid.  -A dose of Dificid was given the morning of 01/23/2021, however patient wants to continue oral vancomycin for now as she feels number of stools slowly decreasing. -CT abdomen and pelvis ordered per Oncology for further evaluation of patient's abdominal pain. -Continued oral vancomycin to complete a 14-day course on 01/29/2021 but ID exetended coverage as she is on Abx as above -ID was consulted, see recommendations and they are recommending agreeing with supportive care (Florastor and Polycarbophil) and recommending the patient finish a total of 14 days of p.o. vancomycin with the last day being 01/30/2019.  She is not on systemic antibiotics given her drain culture -She continues to have some abdominal pain so was on pain control with 2 mg of p.o. hydromorphone every 4 hours as needed for severe pain; Now medical oncology has increased this to 1 mg every 2 hours as needed severe pain and 2 mg p.o. every 2 as needed for severe pain and will defer to them to them for Pain Control; pain is still there but is improving  Metastatic appendiceal cancer, pseudomyxoma peritonei  and peritoneal carcinomatosis status post CRS/HI PEC and splenectomy in 2005 and recently CRS/HI PEC on 12/10/2020 for recurrent cancer -Medical Oncology consulted and appreciate further evaluation and Recommendations -Will need to follow up at D/C with Surgeon in [facility] or Specialist at [facility]  History of DVT and PE status post IVC filter -Continue home regimen prophylactic Lovenox 40 mg sq q24h but may need to escalate given 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 daily with breakfast, 5 mg p.o. daily, and then 10 mg p.o. nightly.  Type 2 Diabetes Mellitus  -Hemoglobin A1c 5.8 (3/26) -Diet controlled. -Blood Sugars ranging from 105-122 on daily BMP/CMP; Repeat Blood Sugar was 117 -If necessary will place on sensitive NovoLog sliding scale insulin AC  Hypokalemia -Secondary to GI losses.   -Potassium is improved to 3.6 -Magnesium at 1.8 so will replete with IV Mag Sulfate 2 grams -Continue to Monitor and Trend -Repeat CMP in the  AM   Autoimmune Hepatitis -Continue to hold azathioprine and could likely resume once antibiotic course has been completed with clinical improvement.  -LFTs currently normal with an AST of 26 and ALT of 13 -Continue to monitor and trend and repeat CMP in a.m.  Normocytic Anemia -Patient's hemoglobin/hematocrit is relatively stable at 10.7/34.7 with an MCV of 99.4 -Checked Anemia Panel and showed an iron level of 27, U IBC 273, TIBC of 300, saturation ratios of 9%, ferritin level of 76, folate level 15.5, vitamin B12 240 -She received a dose of Feraheme during this hospitalization  -Continue to monitor for signs and symptoms  bleeding; currently no overt bleeding noted -Repeat CBC in a.m.  Thrombocytosis -Patient's Platelet Count went from 310 -> 347 -> 392 -> 408 -> 383 -> 417 -> 415 -> 394 -Continue to Monitor and Trend -Repeat CBC in the AM   Dizziness in the setting of Orthostaic Hypotension -Patient states that she is dizzy upon standing; Orthostatics check and she was Positive and went from 145/87 sitting to 104/79 standing  -We will check orthostatic vital signs -Given a 500 mL bolus yesterday but will be given a 1 Liter bolus today  -Placed on maintenance IV fluids at 75 mils per hour -Obtain TED hose and may benefit from an Abdominal BInder -PT OT may need to reevaluate for vestibular testing -If necessary may add Midodrine  Obesity -Complicates overall prognosis and care -Estimated body mass index is 36.32 kg/m as calculated from the following:   Height as of this encounter: 5\' 6"  (1.676 m).   Weight as of this encounter: 102.1 kg. -Dietitian consulted for further evaluation and recommendtions -C/w Beneprotein TIDwm and MVI+Minerals   DVT prophylaxis: Enoxaparin 40 mg sq q24h Code Status: FULL CODE  Family Communication: No family present at bedside  Disposition Plan: Pending further clinical improvement and clearance by infectious diseases, medical oncology, general surgery and interventional radiology  Status is: Inpatient  Remains inpatient appropriate because:Unsafe d/c plan, IV treatments appropriate due to intensity of illness or inability to take PO and Inpatient level of care appropriate due to severity of illness  Dispo: The patient is from: Home              Anticipated d/c is to: TBD              Patient currently is not medically stable to d/c.   Difficult to place patient No  Consultants:   Medical Oncology  General Surgery  Interventional Radiology  Infectious Diseases    Procedures:   CT abdomen and pelvis 01/16/2021  CT abdomen and pelvis 01/26/2021  CT  guided drainage procedure of abdominal wall abscess 01/27/21  Antimicrobials:  Anti-infectives (From admission, onward)   Start     Dose/Rate Route Frequency Ordered Stop   01/30/21 1000  vancomycin (VANCOCIN) 50 mg/mL oral solution 125 mg        125 mg Oral Every 12 hours 01/29/21 1431     01/29/21 2200  metroNIDAZOLE (FLAGYL) tablet 500 mg        500 mg Oral Every 12 hours 01/29/21 1700     01/29/21 1800  metroNIDAZOLE (FLAGYL) tablet 500 mg  Status:  Discontinued        500 mg Oral Every 8 hours 01/29/21 1431 01/29/21 1700   01/29/21 1530  cefdinir (OMNICEF) capsule 300 mg        300 mg Oral Every 12 hours 01/29/21 1431     01/28/21 1800  metroNIDAZOLE (  FLAGYL) IVPB 500 mg  Status:  Discontinued        500 mg 100 mL/hr over 60 Minutes Intravenous Every 8 hours 01/28/21 1517 01/29/21 1431   01/28/21 1600  ciprofloxacin (CIPRO) IVPB 400 mg  Status:  Discontinued        400 mg 200 mL/hr over 60 Minutes Intravenous Every 12 hours 01/28/21 1517 01/29/21 1431   01/23/21 1315  vancomycin (VANCOCIN) 50 mg/mL oral solution 125 mg        125 mg Oral Every 6 hours 01/23/21 1226 01/29/21 2359   01/23/21 1000  fidaxomicin (DIFICID) tablet 200 mg  Status:  Discontinued        200 mg Oral 2 times daily 01/23/21 0859 01/23/21 1226   01/17/21 0600  vancomycin (VANCOREADY) IVPB 1500 mg/300 mL  Status:  Discontinued        1,500 mg 150 mL/hr over 120 Minutes Intravenous Every 24 hours 01/16/21 1732 01/17/21 1216   01/16/21 2200  metroNIDAZOLE (FLAGYL) IVPB 500 mg  Status:  Discontinued        500 mg 100 mL/hr over 60 Minutes Intravenous Every 8 hours 01/16/21 1622 01/17/21 1216   01/16/21 2000  vancomycin (VANCOCIN) 50 mg/mL oral solution 125 mg  Status:  Discontinued        125 mg Oral 4 times daily 01/16/21 1805 01/23/21 0859   01/16/21 1800  ceFEPIme (MAXIPIME) 2 g in sodium chloride 0.9 % 100 mL IVPB  Status:  Discontinued        2 g 200 mL/hr over 30 Minutes Intravenous Every 8 hours 01/16/21  1728 01/17/21 1216   01/16/21 1545  vancomycin (VANCOCIN) 50 mg/mL oral solution 125 mg  Status:  Discontinued        125 mg Oral 4 times daily 01/16/21 1539 01/16/21 1622   01/16/21 1445  metroNIDAZOLE (FLAGYL) IVPB 500 mg        500 mg 100 mL/hr over 60 Minutes Intravenous  Once 01/16/21 1438 01/16/21 1622   01/16/21 1330  vancomycin (VANCOCIN) IVPB 1000 mg/200 mL premix        1,000 mg 200 mL/hr over 60 Minutes Intravenous  Once 01/16/21 1320 01/16/21 1538        Subjective: Patient was seen and examined at bedside and was still having a lot of abdominal pain and she continues to be extremely dizzy when standing up.  Also complaining about the Flagyl states that she vomits Flagyl up almost immediately.  Denies any nausea or vomiting but did not feel as well.  Awaiting drain study today.  No other concerns or complaints at this time and has not been very mobile recently  Objective: Vitals:   01/31/21 0617 01/31/21 1332 01/31/21 2235 02/01/21 0459  BP: 134/72 120/84 122/74 (!) 146/82  Pulse: 74 76 72 72  Resp: 17 18 20 14   Temp: 98.5 F (36.9 C)  98.7 F (37.1 C) 98.3 F (36.8 C)  TempSrc: Oral  Oral Oral  SpO2: 90% 93% 97% 91%  Weight:      Height:        Intake/Output Summary (Last 24 hours) at 02/01/2021 1240 Last data filed at 02/01/2021 1213 Gross per 24 hour  Intake 1034.68 ml  Output 300 ml  Net 734.68 ml   Filed Weights   01/16/21 1700  Weight: 102.1 kg   Examination: Physical Exam:  Constitutional: WN/WD chronically ill appearing obese in NAD and appears calm but uncomfortable Eyes: Lids and conjunctivae normal, sclerae anicteric  ENMT: External Ears, Nose appear normal. Grossly normal hearing.  Neck: Appears normal, supple, no cervical masses, normal ROM, no appreciable thyromegaly; no JVD Respiratory: Diminished to auscultation bilaterally, no wheezing, rales, rhonchi or crackles. Normal respiratory effort and patient is not tachypenic. No accessory muscle  use. Unlabored breathing Cardiovascular: RRR, no murmurs / rubs / gallops. S1 and S2 auscultated. Trace extremity edema Abdomen: Soft, Tender to palpate, Distended 2/2 body habitus. Abdominal drain present. Bowel sounds positive.  GU: Deferred. Musculoskeletal: No clubbing / cyanosis of digits/nails. No joint deformity upper and lower extremities.  Skin: No rashes, lesions, ulcers on a limited skin evaluation. No induration; Warm and dry.  Neurologic: CN 2-12 grossly intact with no focal deficits. Romberg sign and cerebellar reflexes not assessed.  Psychiatric: Normal judgment and insight. Alert and oriented x 3. Normal mood and appropriate affect.   Data Reviewed: I have personally reviewed following labs and imaging studies  CBC: Recent Labs  Lab 01/28/21 0453 01/29/21 0601 01/30/21 0531 01/31/21 0607 02/01/21 0600  WBC 10.4 10.7* 7.8 8.8 9.4  NEUTROABS 4.6 4.5 2.2 2.9 2.9  HGB 10.7* 10.6* 10.7* 10.6* 10.4*  HCT 35.0* 34.1* 34.7* 35.2* 33.1*  MCV 100.9* 100.3* 99.4 101.4* 100.0  PLT 408* 383 417* 415* 354   Basic Metabolic Panel: Recent Labs  Lab 01/28/21 0453 01/29/21 0601 01/30/21 0531 01/31/21 0607 02/01/21 0600  NA 139 136 141 137 138  K 3.4* 4.0 4.3 3.8 3.6  CL 103 103 104 103 107  CO2 26 24 28 25 25   GLUCOSE 109* 109* 116* 118* 117*  BUN 10 10 9 11 10   CREATININE 0.48 0.54 0.53 0.64 0.59  CALCIUM 8.6* 8.9 9.0 8.7* 8.4*  MG 1.9 1.9 2.0 1.9 1.8  PHOS 3.8 3.4 3.7 4.0 3.6   GFR: Estimated Creatinine Clearance: 92.4 mL/min (by C-G formula based on SCr of 0.59 mg/dL). Liver Function Tests: Recent Labs  Lab 01/28/21 0453 01/29/21 0601 01/30/21 0531 01/31/21 0607 02/01/21 0600  AST 32 30 29 35 26  ALT 18 16 14 14 13   ALKPHOS 275* 261* 265* 269* 277*  BILITOT 0.7 0.8 0.6 0.7 0.6  PROT 6.7 6.5 6.9 6.7 6.4*  ALBUMIN 2.3* 2.3* 2.4* 2.4* 2.3*   No results for input(s): LIPASE, AMYLASE in the last 168 hours. No results for input(s): AMMONIA in the last 168  hours. Coagulation Profile: No results for input(s): INR, PROTIME in the last 168 hours. Cardiac Enzymes: No results for input(s): CKTOTAL, CKMB, CKMBINDEX, TROPONINI in the last 168 hours. BNP (last 3 results) No results for input(s): PROBNP in the last 8760 hours. HbA1C: No results for input(s): HGBA1C in the last 72 hours. CBG: No results for input(s): GLUCAP in the last 168 hours. Lipid Profile: No results for input(s): CHOL, HDL, LDLCALC, TRIG, CHOLHDL, LDLDIRECT in the last 72 hours. Thyroid Function Tests: No results for input(s): TSH, T4TOTAL, FREET4, T3FREE, THYROIDAB in the last 72 hours. Anemia Panel: No results for input(s): VITAMINB12, FOLATE, FERRITIN, TIBC, IRON, RETICCTPCT in the last 72 hours. Sepsis Labs: No results for input(s): PROCALCITON, LATICACIDVEN in the last 168 hours.  Recent Results (from the past 240 hour(s))  Aerobic/Anaerobic Culture (surgical/deep wound)     Status: None (Preliminary result)   Collection Time: 01/27/21  3:47 PM   Specimen: Abscess  Result Value Ref Range Status   Specimen Description   Final    ABSCESS POST CT GUIDED DRAIN Performed at [facility] [address] [place], [place]  [address]    Special Requests   Final    NONE Performed at [facility], [place] [address] [place], [place] [address]    Gram Stain   Final    ABUNDANT WBC PRESENT, PREDOMINANTLY PMN NO ORGANISMS SEEN Performed at [facility], [place] [address], [place],  [address]    Culture   Final    FEW KLEBSIELLA PNEUMONIAE NO ANAEROBES ISOLATED; CULTURE IN PROGRESS FOR 5 DAYS    Report Status PENDING  Incomplete   Organism ID, Bacteria KLEBSIELLA PNEUMONIAE  Final      Susceptibility   Klebsiella pneumoniae - MIC*    AMPICILLIN >=32 RESISTANT Resistant     CEFAZOLIN <=4 SENSITIVE Sensitive     CEFEPIME <=0.12 SENSITIVE Sensitive     CEFTAZIDIME <=1 SENSITIVE Sensitive     CEFTRIAXONE <=0.25 SENSITIVE  Sensitive     CIPROFLOXACIN <=0.25 SENSITIVE Sensitive     GENTAMICIN <=1 SENSITIVE Sensitive     IMIPENEM <=0.25 SENSITIVE Sensitive     TRIMETH/SULFA <=20 SENSITIVE Sensitive     AMPICILLIN/SULBACTAM 4 SENSITIVE Sensitive     PIP/TAZO <=4 SENSITIVE Sensitive     * FEW KLEBSIELLA PNEUMONIAE     RN Pressure Injury Documentation:     Estimated body mass index is 36.32 kg/m as calculated from the following:   Height as of this encounter: 5\' 6"  (1.676 m).   Weight as of this encounter: 102.1 kg.  Malnutrition Type:  Nutrition Problem: Increased nutrient needs Etiology: cancer and cancer related treatments   Malnutrition Characteristics:  Signs/Symptoms: estimated needs   Nutrition Interventions:  Interventions: Juven,MVI,Premier Protein   Radiology Studies: No results found. Scheduled Meds: . amLODipine  10 mg Oral Daily  . busPIRone  5 mg Oral Q breakfast   And  . busPIRone  5 mg Oral Q1200   And  . busPIRone  10 mg Oral QHS  . cefdinir  300 mg Oral Q12H  . enoxaparin  40 mg Subcutaneous Q24H  . famotidine  40 mg Oral Daily  . fentaNYL  1 patch Transdermal Q72H  . Gerhardt's butt cream   Topical QID  . metroNIDAZOLE  500 mg Oral Q12H  . multivitamin with minerals  1 tablet Oral Daily  . polycarbophil  625 mg Oral Daily  . propranolol  20 mg Oral BID  . saccharomyces boulardii  250 mg Oral BID  . scopolamine  1 patch Transdermal Q72H  . sodium chloride flush  3 mL Intravenous Q12H  . sodium chloride flush  5 mL Intracatheter Q8H  . vancomycin  125 mg Oral Q12H   Continuous Infusions: . sodium chloride 75 mL/hr at 02/01/21 0356    LOS: 16 days   Kerney Elbe, DO [facility] PAGER is on AMION  If 7PM-7AM, please contact night-coverage [email or web address]

## 2021-02-01 NOTE — Progress Notes (Signed)
Over the weekend, everything seems to be improving for Wendy Barber.  The diarrhea is not nearly as bad.  There is still at drainage output from the abscess have has a drain.  Klebsiella is growing out of this.  I think she is on oral antibiotics right now.  Surgery does not think that there is going to be a surgical intervention.  She apparently is going for a dye study today.  Hopefully there is no fistula.  She does not have much of an appetite.  She has had no issues with bleeding.  There has been no fever.  There is no cough or shortness of breath.  There is still some pain issues.  She is still taking the Dilaudid on a relatively frequent basis.  She does have the Duragesic patch on.  Her labs show white cell count of 9.4.  Hemoglobin 10.4.  The platelet count is 394,000.  Sodium is 138.  Potassium 3.6.  BUN 10 creatinine 0.59.  Calcium is 8.4 but the albumin is 2.3.  Her liver function studies are okay although the alkaline phosphatase is up a little bit at 277.  She really is not out of bed all that much.  Apparently, there is some problems with postural hypotension when she gets up.  She gets dizzy when she gets up.  Her vital signs are temperature 98.3.  Pulse 72.  Blood pressure 146/82.  Her lungs are clear bilaterally.  Cardiac exam regular rate and rhythm with a normal S1 and S2.  There are no murmurs, rubs or bruits.  Abdomen is soft.  She has dressing over the abdominal wound.  She has decent bowel sounds.  There is some tenderness to palpation over the lower abdomen.  Extremities shows no clubbing, cyanosis or edema.  Skin exam no rashes.  Neurological exam no focal neurological deficits.  Hopefully, will be able to get Ms. Vilardi home this week.  We will have to see her that study shows.  She is on Omnicef and Flagyl and oral vancomycin.  She is on a probiotic.  I know the staff of [facility] [address] has done a fantastic job with her.  This is quite complicated.  I appreciate all their  help.  [name], MD  [facility] [phone]

## 2021-02-01 NOTE — Progress Notes (Signed)
Nutrition Follow-up  DOCUMENTATION CODES:   Obesity unspecified  INTERVENTION:   -Beneprotein TID with meal, each provides 25 kcals and 6g protein  -Multivitamin with minerals daily  NUTRITION DIAGNOSIS:   Increased nutrient needs related to cancer and cancer related treatments as evidenced by estimated needs.  GOAL:   Patient will meet greater than or equal to 90% of their needs  MONITOR:   PO intake,Supplement acceptance,Labs,Weight trends,I & O's,Skin  ASSESSMENT:   60 year old female with a past medical history of metastatic appendiceal cancer, pseudomyxoma peritonei and peritoneal carcinomatosis s/p CRS/HIPEC and splenectomy in 2005 and recently CRS/HIPEC on 12/10/2020 for recurrent cancer, autoimmune hepatitis, DVT and PE with IVC filter currently on Lovenox, hypertension, diabetes who presented to the ED with multiple complaints including intractable diarrhea since her procedure, fatigue and infection wound drainage this a.m.She was admitted for C diff colitis and concern for post op wound infection  Per surgery note, pt to have IR drain study today. Diarrhea is improving. Tolerated 100% of breakfast today (providing ~350 kcals, 25g protein). Will trial Beneprotein for additional protein with meals.   Admission weight: 225 lbs. Needs new weight for admission.  Medications: Multivitamin with minerals daily, [employer], [employer] reviewed.  Diet Order:   Diet Order            Diet heart healthy/carb modified Room service appropriate? Yes; Fluid consistency: Thin  Diet effective now                 EDUCATION NEEDS:   No education needs have been identified at this time  Skin:  Skin Assessment: Skin Integrity Issues: Skin Integrity Issues:: Incisions Incisions: lower abdomen  Last BM:  3/27 -type 7  Height:   Ht Readings from Last 1 Encounters:  01/16/21 5\' 6"  (1.676 m)    Weight:   Wt Readings from Last 1 Encounters:  01/16/21 102.1 kg     BMI:  Body mass index is 36.32 kg/m.  Estimated Nutritional Needs:   Kcal:  2000-2200  Protein:  100-115g  Fluid:  2L/day  Clayton Bibles, MS, RD, LDN Inpatient Clinical Dietitian Contact information available via Amion

## 2021-02-01 NOTE — Progress Notes (Signed)
PT Cancellation Note  Patient Details Name: Wendy Barber MRN: [id number] DOB: May 17, 1961   Cancelled Treatment:    Reason Eval/Treat Not Completed: Other (comment) Pt awaiting IR drain study.  RN reports pt has issues with dizziness, noted orthostatic hypotension  [name],KATHrine E 02/01/2021, 10:51 AM Jannette Spanner PT, DPT Acute Rehabilitation Services Pager: [phone] Office: [phone]

## 2021-02-02 ENCOUNTER — Inpatient Hospital Stay (HOSPITAL_COMMUNITY): Payer: Medicare Other

## 2021-02-02 DIAGNOSIS — K754 Autoimmune hepatitis: Secondary | ICD-10-CM | POA: Diagnosis not present

## 2021-02-02 DIAGNOSIS — A0472 Enterocolitis due to Clostridium difficile, not specified as recurrent: Secondary | ICD-10-CM | POA: Diagnosis not present

## 2021-02-02 DIAGNOSIS — T8149XA Infection following a procedure, other surgical site, initial encounter: Secondary | ICD-10-CM | POA: Diagnosis not present

## 2021-02-02 DIAGNOSIS — I82491 Acute embolism and thrombosis of other specified deep vein of right lower extremity: Secondary | ICD-10-CM

## 2021-02-02 DIAGNOSIS — L02211 Cutaneous abscess of abdominal wall: Secondary | ICD-10-CM | POA: Diagnosis not present

## 2021-02-02 DIAGNOSIS — R609 Edema, unspecified: Secondary | ICD-10-CM

## 2021-02-02 HISTORY — PX: IR CATHETER TUBE CHANGE: IMG717

## 2021-02-02 LAB — CBC WITH DIFFERENTIAL/PLATELET
Abs Immature Granulocytes: 0.04 10*3/uL (ref 0.00–0.07)
Basophils Absolute: 0.1 10*3/uL (ref 0.0–0.1)
Basophils Relative: 1 %
Eosinophils Absolute: 0.4 10*3/uL (ref 0.0–0.5)
Eosinophils Relative: 5 %
HCT: 33.4 % — ABNORMAL LOW (ref 36.0–46.0)
Hemoglobin: 10.2 g/dL — ABNORMAL LOW (ref 12.0–15.0)
Immature Granulocytes: 1 %
Lymphocytes Relative: 46 %
Lymphs Abs: 3.7 10*3/uL (ref 0.7–4.0)
MCH: 30.5 pg (ref 26.0–34.0)
MCHC: 30.5 g/dL (ref 30.0–36.0)
MCV: 100 fL (ref 80.0–100.0)
Monocytes Absolute: 1.1 10*3/uL — ABNORMAL HIGH (ref 0.1–1.0)
Monocytes Relative: 14 %
Neutro Abs: 2.6 10*3/uL (ref 1.7–7.7)
Neutrophils Relative %: 33 %
Platelets: 251 10*3/uL (ref 150–400)
RBC: 3.34 MIL/uL — ABNORMAL LOW (ref 3.87–5.11)
RDW: 19.5 % — ABNORMAL HIGH (ref 11.5–15.5)
WBC: 7.9 10*3/uL (ref 4.0–10.5)
nRBC: 0 % (ref 0.0–0.2)

## 2021-02-02 LAB — PHOSPHORUS: Phosphorus: 3.3 mg/dL (ref 2.5–4.6)

## 2021-02-02 LAB — AEROBIC/ANAEROBIC CULTURE W GRAM STAIN (SURGICAL/DEEP WOUND)

## 2021-02-02 LAB — PROTIME-INR
INR: 1.2 (ref 0.8–1.2)
Prothrombin Time: 14.4 seconds (ref 11.4–15.2)

## 2021-02-02 LAB — PREALBUMIN: Prealbumin: 10.7 mg/dL — ABNORMAL LOW (ref 18–38)

## 2021-02-02 LAB — COMPREHENSIVE METABOLIC PANEL
ALT: 13 U/L (ref 0–44)
AST: 30 U/L (ref 15–41)
Albumin: 2.3 g/dL — ABNORMAL LOW (ref 3.5–5.0)
Alkaline Phosphatase: 253 U/L — ABNORMAL HIGH (ref 38–126)
Anion gap: 7 (ref 5–15)
BUN: 6 mg/dL (ref 6–20)
CO2: 23 mmol/L (ref 22–32)
Calcium: 8.2 mg/dL — ABNORMAL LOW (ref 8.9–10.3)
Chloride: 107 mmol/L (ref 98–111)
Creatinine, Ser: 0.45 mg/dL (ref 0.44–1.00)
GFR, Estimated: >60 mL/min (ref >60)
Glucose, Bld: 107 mg/dL — ABNORMAL HIGH (ref 70–99)
Potassium: 3.5 mmol/L (ref 3.5–5.1)
Sodium: 137 mmol/L (ref 135–145)
Total Bilirubin: 0.7 mg/dL (ref 0.3–1.2)
Total Protein: 6.4 g/dL — ABNORMAL LOW (ref 6.5–8.1)

## 2021-02-02 LAB — MAGNESIUM: Magnesium: 1.9 mg/dL (ref 1.7–2.4)

## 2021-02-02 LAB — HEPARIN LEVEL (UNFRACTIONATED): Heparin Unfractionated: <0.1 IU/mL — ABNORMAL LOW (ref 0.30–0.70)

## 2021-02-02 MED ORDER — HEPARIN (PORCINE) 25000 UT/250ML-% IV SOLN
1600.0000 [IU]/h | INTRAVENOUS | Status: DC
  Administered 2021-02-02 – 2021-02-03 (×2): 1700 [IU]/h via INTRAVENOUS
  Administered 2021-02-03: 1600 [IU]/h via INTRAVENOUS
  Filled 2021-02-02 (×5): qty 250

## 2021-02-02 MED ORDER — FENTANYL CITRATE (PF) 100 MCG/2ML IJ SOLN
INTRAMUSCULAR | Status: AC | PRN
  Administered 2021-02-02 (×2): 50 ug via INTRAVENOUS

## 2021-02-02 MED ORDER — HEPARIN BOLUS VIA INFUSION
2000.0000 [IU] | INTRAVENOUS | Status: AC
  Administered 2021-02-02: 2000 [IU] via INTRAVENOUS
  Filled 2021-02-02: qty 2000

## 2021-02-02 MED ORDER — IOHEXOL 300 MG/ML  SOLN
50.0000 mL | Freq: Once | INTRAMUSCULAR | Status: AC | PRN
  Administered 2021-02-02: 10 mL

## 2021-02-02 MED ORDER — FENTANYL CITRATE (PF) 100 MCG/2ML IJ SOLN
INTRAMUSCULAR | Status: AC
  Filled 2021-02-02: qty 2

## 2021-02-02 MED ORDER — HEPARIN (PORCINE) 25000 UT/250ML-% IV SOLN
1400.0000 [IU]/h | INTRAVENOUS | Status: DC
  Administered 2021-02-02: 1400 [IU]/h via INTRAVENOUS
  Filled 2021-02-02: qty 250

## 2021-02-02 MED ORDER — MIDAZOLAM HCL 2 MG/2ML IJ SOLN
INTRAMUSCULAR | Status: AC | PRN
  Administered 2021-02-02 (×3): 1 mg via INTRAVENOUS

## 2021-02-02 MED ORDER — HEPARIN BOLUS VIA INFUSION
2500.0000 [IU] | Freq: Once | INTRAVENOUS | Status: AC
  Administered 2021-02-02: 2500 [IU] via INTRAVENOUS
  Filled 2021-02-02: qty 2500

## 2021-02-02 MED ORDER — LIDOCAINE-EPINEPHRINE 1 %-1:100000 IJ SOLN
INTRAMUSCULAR | Status: AC | PRN
  Administered 2021-02-02: 10 mL

## 2021-02-02 MED ORDER — MIDAZOLAM HCL 2 MG/2ML IJ SOLN
INTRAMUSCULAR | Status: AC
  Filled 2021-02-02: qty 4

## 2021-02-02 MED ORDER — LIDOCAINE HCL 1 % IJ SOLN
INTRAMUSCULAR | Status: AC
  Filled 2021-02-02: qty 20

## 2021-02-02 NOTE — Progress Notes (Addendum)
[place] for heparin Indication: VTE  Allergies  Allergen Reactions  . Penicillins Shortness Of Breath    Other reaction(s): Irregular Heart Rate, Other (See Comments) Rapid heartrate   . Alprazolam Hives and Other (See Comments)    Note: tolerates midazolam fine unresponsive Hard to arouse unresponsive Hard to arouse  Other reaction(s): Other (See Comments) unresponsive  . Ativan [Lorazepam] Other (See Comments)    Note: tolerates midazolam fine Face & Throat Swelling.  . Corticosteroids Other (See Comments)    Other reaction(s): Other (see comments) Psychotic behaviour    . Erythromycin     Other reaction(s): Other (See Comments) Severe stomach pain   . Erythromycin Base Hives  . Milnacipran Hcl Other (See Comments)    Other reaction(s):  psychotic episode     . Prednisone Other (See Comments)    Anxiety & Nervous Breakdown.  Ocie Cornfield  [Milnacipran]   . Povidone Iodine Rash    Had rash under bandage after surgery , has had betadine since without reaction   . Prednisolone Anxiety    Patient Measurements: Height: 5\' 6"  (167.6 cm) Weight: 102.1 kg (225 lb) IBW/kg (Calculated) : 59.3 Heparin Dosing Weight: 82 kg  Vital Signs: Temp: 98.2 F (36.8 C) (04/12 0641) Temp Source: Oral (04/12 0641) BP: 127/88 (04/12 0641) Pulse Rate: 73 (04/12 0641)  Labs: Recent Labs    01/31/21 0607 02/01/21 0600 02/02/21 0515  HGB 10.6* 10.4* 10.2*  HCT 35.2* 33.1* 33.4*  PLT 415* 394 251  LABPROT  --   --  14.4  INR  --   --  1.2  CREATININE 0.64 0.59 0.45    Estimated Creatinine Clearance: 92.4 mL/min (by C-G formula based on SCr of 0.45 mg/dL).   Medications:  - on lovenox 40mg  daily PTA  Assessment: Pt's a 60 y.o. F with complicated medical hx that includes metastatic appendiceal cancer, autoimmune hepatitis, CRS/HIPEC for recurrent cancer, and PE/DVT (s/p IVC filter) presented to the ED on 3/26 with c/o n/v/d.   During this admission, she was treated for cdiff and is currently on abx for abdominal surgical wound site infection.  Abdominal CT on 4/11 showed findings with "concern for propagation of clot in the superior mesenteric vein extending into the main portal vein" and "for DVT in the right groin extending into the right external iliac vein."  Pharmacy has been consulted to start heparin drip for VTE.   Goal of Therapy:  Heparin level 0.3-0.7 units/ml Monitor platelets by anticoagulation protocol: Yes   Plan:  - d/c lovenox 40mg  daily - heparin 2500 units IV x1, then drip at 1400 units/hr - check 6 hr heparin level - monitor for s/sx bleeding  Lynelle Doctor 02/02/2021,8:25 AM ________________________________________ Adden: Pt is scheduled for drain replacement today by IR. Spoke to [employer], IR team is ok with starting heparin drip this morning and drip does not need to be held for procedure.  Dia Sitter, PharmD, BCPS 02/02/2021 9:24 AM

## 2021-02-02 NOTE — Progress Notes (Signed)
Looks like Wendy Barber has a fistula.  She is going out for another procedure today from interventional radiology.  If a fistula is truly the problem, she will go back up to [facility] and have her surgeon, Dr. Sharon Mt, manage this.  She still has having quite a bit of output from the catheter.  A second catheter is currently placed.  She has a low albumin.  We really need to try to get this improved.  If her albumin gets above 3, then I think she will be able to heal up much better if she does need any kind of surgery.  She has had no problems with bleeding.  There is still some pain issues.  She is on the Duragesic patch and getting Dilaudid as needed.  She has had no fever.  She really has not gotten out of bed much.  Apparently there are some issues with hypotension when she gets up.  A lot of this might just be deconditioning or her just mostly being in the bed.  If possible it may not be a bad idea to get her sit in the chair for a while.  Her labs show white cell count of 7.9.  Hemoglobin 10.2.  Platelet count 251,000.  Her BUN is 6 creatinine 1.45.  Her blood sugar is 107.  Her calcium is 8.2.  Her vital signs show temperature 98.2.  Pulse 73.  Blood pressure 127/88.  Her abdomen is soft.  She has abdominal dressing in the lower abdomen.  She has a drainage catheter coming out of the dressing.  Her bowel sounds are decreased.  There is no guarding or rebound tenderness.  Her lungs are clear.  Cardiac exam regular rate and rhythm.  It sounds like her diarrhea is better.  This is encouraging at least with respect to the C. difficile.  We will does have to see what is found with the study today.  [name], MD  [facility] [phone]

## 2021-02-02 NOTE — Progress Notes (Signed)
PROGRESS NOTE    Wendy Barber  HEN:[id number] DOB: June 05, 1961 DOA: 01/16/2021 PCP: Glendon Axe, MD  Brief Narrative:  The patient is a 60 year old Caucasian female with a past medical history significant for but not limited to metastatic appendiceal cancer, pseudomyxoma peritonei and peritoneal carcinomatosis status post CRS/HIPEC and splenectomy in 2005 and recent CRS/HIPEC on 12/10/2020 for recurrent cancer, autoimmune hepatitis, DVT and PE with IVC filter currently on Lovenox, hypertension, diabetes mellitus type 2 as well as other comorbidities who presented to the ED with multiple complaints including intractable diarrhea since her recent procedure and fatigue as well as wound drainage.  Since her last hospitalization February she has had persistent diarrhea but has negative for C. difficile started on Imodium and Lomotil with some improvement but recently seen by her oncologist in [place] on 316 and states that she is started on antibiotics at that time.  Since then her diarrhea has worsened and she is more fatigued and has had a decreased appetite with increased abdominal pain.  She went to the bathroom and had an area of open incision with significant amount of purulent foul-smelling drainage.  Was admitted for C. difficile colitis and for concern for postop wound infection.  Surgery was initially evaluated and signed off the case however medical oncology obtained a CT scan of the abdomen pelvis yesterday which showed that her abdominal abscess appears to be little bit larger and that she may have a fistula so IR and General Surgery were reconsulted.  Interventional radiology placed a 10 French drain in her abscess on 01/27/21 and it yielded 65 cc of amber-colored fluid.  Her drain was connected to gravity bag and was sent off for culture and is growing out Klebsiella pneumoniae so ID is recommending Cipro/Flagyl given her PCN Allergy.  General Surgery was reconsulted and they evaluated the  patient and feel that her fistula is related to her surgery in [place] in February.  Dr. Harlow Asa evaluated recommends continuing the drain and having the patient follow-up in the IR drain clinic in 10 to 14 days and may need a drain injection to evaluate for a fistula.  Interventional radiology recommends continuing current drain management with every shift flushes and monitoring of output and planning repeat CT scan and possible drain injection when output is less than 10 cc a day to assess for possible removal.  Patient's pain seems controlled today but she still felt a little nauseous.  Diarrhea has improved and she is completed her antibiotics for C. difficile however has been started on IV ciprofloxacin and IV Flagyl for the Klebsiella growing from her abdominal abscess and white count slowly trended up.  Oncology continues her on a fentanyl patch and alternating her on p.o. Dilaudid and IV.  She also received a dose of Feraheme on 01/28/21.  General surgery recommending continuing drain management and continue to monitor output and patient is to be arranged follow-up with the IR drain clinic and follow-up with her surgeon in [place]  ID has re-evaluated and recommending Vancomycin 50 mg po BID for the duration of systemic Abx + 1 week beyond and they are also recommending transitioning po Metronidazole 500 mg po BID and Cefdinir 300 mg po BID x 6 weeks until 03/12/21. General Surgery recommending repeating CT scan and drain study and this will be done day. She is slowly improving and not as nauseous but continues to have abdominal pain and now she feels dizzy whenever she stands up. She was ordered a 500 mL bolus yesterday  and started on MiVF and will be given a 1 Liter bolus today and Resume TED Hose.   Repeat CT scan was done on the evening of 411 and showed that the anterior abdominal wall fluid collection had minimally decreased in size since the placement of percutaneous drain and at the drain  extended through the collection and terminates in the midline of the abdomen near loops of bowel.  There was also fluid collection to the tip of the catheter within the abdomen that represent the loop of bowel versus extraluminal fluid collectio and they recommended that this could be further evaluated with a drain injection.  There is concern for propagation of clot at the superior mesenteric vein extending to the main portal addition there is concern for DVT in the right groin extending into the external iliac vein.  Patient was noted to have persistent and slightly increased mesenteric edema. LE Venous Duplex did show DVT so she was started on Anticoagulation.   Assessment & Plan:   Principal Problem:   Surgical wound infection Active Problems:   Autoimmune hepatitis ([place])   DM2 (diabetes mellitus, type 2) ([place])   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)   Malignant pseudomyxoma peritonei ([place])   DVT of deep femoral vein, left ([place])   Pulmonary embolism, bilateral (HCC)   Presence of IVC filter   C. difficile colitis   Abdominal wall abscess   Hypokalemia  Abdominal postsurgical Wound Infection and Associated Abdominal Abscess status post midline lower abdominal drain placement by interventional radiology on 01/27/2021 -Patient presented with concerns for abdominal postsurgical wound infection. -Patient afebrile on room air, no leukocytosis. -Status post cytoreductive surgery/HI PEC 12/10/2020 at [facility] in [place], [place] with -Continued to Monitor leukocytosis and is slowly trending up and if worsening may need to add fluconazole but will defer to ID; ID recommending Cipro/Flagyl given Klebsiella from Drain Cx and recommending changing to p.o. cefdinir and p.o. Flagyl but have transitioned back to IV Flagyl  -Pain management was adjusted by medical oncology and may need to adjust given that nursing staff feels the patient is is over utilizing the pain  control -WBC has trended up from 8.8 -> 10.4 -> 10.7 -> 7.8 -> 8.8 -> 9.4 -> 7.9 -Repeat CT abdomen and pelvis ordered per hematology/oncology on 01/26/21 for further evaluation of patient's abdominal pain and showed "Persistent inflammatory type changes involving the colon and small bowel. Slight interval increase in size of the anterior abdominal wall abscess which appears to communicate with a smaller intra-abdominal abscess. Findings worrisome for a fistula. Persistent  Fluid collection in the pelvis just above the bladder anteriorly. This could be a liquified hematoma or postoperative seroma. 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 This may account for some of the colonic inflammation which could be due to vascular congestion. -IR also reconsulted and patient to underwent CT-guided drainage procedure for abdominal wall abscess -Interventional radiology placed a 10 French drain in her abscess on 01/27/21 and it yielded 65 cc of amber-colored fluid.  Her drain was connected to gravity bag and was sent off for culture and is growing out Klebsiella pneumoniae  -General Surgery was reconsulted they feel the fistula was related to her surgery in [place] and they are recommending repeating CT scan and possible drain injection 02/01/21 -Repeat CT Scan showed "Anterior abdominal wall fluid collection has minimally decreased in size since placement of the percutaneous drain. Percutaneous drain extends through the collection  and terminates in the midline of the abdomen near loops of bowel. Fluid collection near the tip of the catheter within the abdomen could represent a loop of bowel versus extraluminal fluid collection. This collection may be further evaluated with a drain injection." -Imaging was reviewed by interventional radiology and the plan is for image guided drain manipulation and interrogation to expedite drainage from the anterior abdominal wall  collection -Surgery recommending no Surgical Intervention  -Pre-Albumin was 10.7 -Continue drain management per interventional radiology and surgical recommendation to monitor output; patient will need follow-up with her surgeon in [place] when stable to D/C   C. difficile Enteritis and Diarrhea; improving  -Patient with multiple loose stools, which seems to be slowing down after Lomotil given last night.   -Patient drank contrast with CT abdomen and pelvis and stated started to have loose stool again.  -Patient describing stool with some mucus noted in. -Afebrile.   -Continue oral vancomycin, probiotic and states diarrhea is slowing down  -Creon added per oncology due to concern for possible of short-bowel with respect to diarrhea as patient had told oncologist has a lot of stool that floats concerning for fatty content.  -Patient complained of abdominal pain since starting Creon and requested Creon to be discontinued which it has.   -Patient stated some improvement with abdominal pain after Creon discontinued.   -PPI discontinued patient started on Pepcid.  -A dose of Dificid was given the morning of 01/23/2021, however patient wants to continue oral vancomycin for now as she feels number of stools slowly decreasing. -CT abdomen and pelvis ordered per Oncology for further evaluation of patient's abdominal pain. -Continued oral vancomycin to complete a 14-day course on 01/29/2021 but ID exetended coverage as she is on Abx as above -ID was consulted, see recommendations and they are recommending agreeing with supportive care (Florastor and Polycarbophil) and recommending the patient finish a total of 14 days of p.o. vancomycin with the last day being 01/30/2019.  She is not on systemic antibiotics given her drain culture -She continues to have some abdominal pain so was on pain control with 2 mg of p.o. hydromorphone every 4 hours as needed for severe pain; Now medical oncology has increased this to  1 mg every 2 hours as needed severe pain and 2 mg p.o. every 2 as needed for severe pain and will defer to them to them for Pain Control; pain is still there but is improving  Metastatic appendiceal cancer, pseudomyxoma peritonei and peritoneal carcinomatosis status post CRS/HI PEC and splenectomy in 2005 and recently CRS/HI PEC on 12/10/2020 for recurrent cancer -Medical Oncology consulted and appreciate further evaluation and Recommendations -Will need to follow up at D/C with Surgeon in [facility] or Specialist at [facility]  History of DVT and PE status post IVC filter; NOW ACUTE DVT SMV Thrombus PV and iliac Thrombus -Continued home regimen prophylactic Lovenox 40 mg sq q24h escalated given 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 In addition, there are is concern for DVT in the right groin extending into the right external iliac vein. Consider further evaluation of the lower extremities with venous duplex imaging" -LE Venous Duplex showed "Findings consistent with acute deep vein thrombosis involving the right common femoral vein, right popliteal vein, right posterior tibial veins,and right peroneal veins. No cystic structure found in the popliteal fossa. Persistent and slightly  increased mesenteric edema. Mesenteric edema is likely related to the postsurgical changes, intra-abdominal / abdominal wall abscess and probably the mesenteric venous thrombus." -Started IV Heparin gtt with Bolus  Essential Hypertension -Continue Amlodipine 10 mg po Daily and Propranolol 20 mg po BID  -Continue to Monitor BP per Protocol  -Last BP was 131/80 today  Essential Tremor -Continue Propranolol 20 mg po BID  Anxiety -C/w Buspirone 5 mg p.o. daily with breakfast, 5 mg p.o. daily, and then 10 mg p.o. nightly. -May need Psych to see given  Depression due to situation  Type 2 Diabetes Mellitus  -Hemoglobin A1c 5.8 (3/26) -Diet controlled. -Blood Sugars ranging from 105-122 on daily BMP/CMP; Repeat Blood Sugar was 107 -If necessary will place on sensitive NovoLog sliding scale insulin AC  Hypokalemia -Secondary to GI losses.   -Potassium is improved to 3.5 -Magnesium at 1.9  -Continue to Monitor and Trend -Repeat CMP in the AM   Autoimmune Hepatitis -Continue to hold azathioprine and could likely resume once antibiotic course has been completed with clinical improvement.  -LFTs currently normal with an AST of 30 and ALT of 13 -Continue to monitor and trend and repeat CMP in a.m.  Normocytic Anemia -Patient's hemoglobin/hematocrit is relatively stable at 10.2/33.4 with an MCV of 100.0 -Checked Anemia Panel and showed an iron level of 27, U IBC 273, TIBC of 300, saturation ratios of 9%, ferritin level of 76, folate level 15.5, vitamin B12 240 -She received a dose of Feraheme during this hospitalization  -Continue to monitor for signs and symptoms bleeding; currently no overt bleeding noted -Repeat CBC in a.m.  Suspected Liver Cirrhosis -CT Scan showed a nodular Contour Suspicious for Cirrhosis -Check RUQ U/S in the AM   Thrombocytosis -Patient's Platelet Count went from 310 -> 347 -> 392 -> 408 -> 383 -> 417 -> 415 -> 394 -> 251 -Continue to Monitor and Trend -Repeat CBC in the AM   Dizziness in the setting of Orthostaic Hypotension -Patient states that she is dizzy upon standing; Orthostatics check and she was Positive and went from 145/87 sitting to 104/79 standing yesterday;  -We will check orthostatic vital signs again  -Placed on maintenance IV fluids at 75 mils per hour but will stop given Cirrhosis -Obtain TED hose and may benefit from an Abdominal BInder -PT OT may need to reevaluate for vestibular testing -If necessary may add Midodrine  Obesity -Complicates overall prognosis and care -Estimated  body mass index is 36.32 kg/m as calculated from the following:   Height as of this encounter: 5\' 6"  (1.676 m).   Weight as of this encounter: 102.1 kg. -Dietitian consulted for further evaluation and recommendtions -C/w Beneprotein TIDwm and MVI+Minerals   DVT prophylaxis: Enoxaparin 40 mg sq q24h now stopped and placed on a Heparin gtt Code Status: FULL CODE  Family Communication: Discussed with Husband via Telephone Disposition Plan: Pending further clinical improvement and clearance by infectious diseases, medical oncology, general surgery and interventional radiology  Status is: Inpatient  Remains inpatient appropriate because:Unsafe d/c plan, IV treatments appropriate due to intensity of illness or inability to take PO and Inpatient level of care appropriate due to severity of illness  Dispo: The patient is from: Home              Anticipated d/c is to: TBD              Patient currently is not medically stable to d/c.   Difficult to place patient No  Consultants:  Medical Oncology  General Surgery  Interventional Radiology  Infectious Diseases    Procedures:   CT abdomen and pelvis 01/16/2021  CT abdomen and pelvis 01/26/2021  CT guided drainage procedure of abdominal wall abscess 01/27/21  CT abdomen and pelvis 02/01/2021  Antimicrobials:  Anti-infectives (From admission, onward)   Start     Dose/Rate Route Frequency Ordered Stop   02/01/21 2200  metroNIDAZOLE (FLAGYL) IVPB 500 mg        500 mg 100 mL/hr over 60 Minutes Intravenous Every 12 hours 02/01/21 1804     01/30/21 1000  vancomycin (VANCOCIN) 50 mg/mL oral solution 125 mg        125 mg Oral Every 12 hours 01/29/21 1431     01/29/21 2200  metroNIDAZOLE (FLAGYL) tablet 500 mg  Status:  Discontinued        500 mg Oral Every 12 hours 01/29/21 1700 02/01/21 1804   01/29/21 1800  metroNIDAZOLE (FLAGYL) tablet 500 mg  Status:  Discontinued        500 mg Oral Every 8 hours 01/29/21 1431 01/29/21 1700    01/29/21 1530  cefdinir (OMNICEF) capsule 300 mg        300 mg Oral Every 12 hours 01/29/21 1431     01/28/21 1800  metroNIDAZOLE (FLAGYL) IVPB 500 mg  Status:  Discontinued        500 mg 100 mL/hr over 60 Minutes Intravenous Every 8 hours 01/28/21 1517 01/29/21 1431   01/28/21 1600  ciprofloxacin (CIPRO) IVPB 400 mg  Status:  Discontinued        400 mg 200 mL/hr over 60 Minutes Intravenous Every 12 hours 01/28/21 1517 01/29/21 1431   01/23/21 1315  vancomycin (VANCOCIN) 50 mg/mL oral solution 125 mg        125 mg Oral Every 6 hours 01/23/21 1226 01/29/21 2359   01/23/21 1000  fidaxomicin (DIFICID) tablet 200 mg  Status:  Discontinued        200 mg Oral 2 times daily 01/23/21 0859 01/23/21 1226   01/17/21 0600  vancomycin (VANCOREADY) IVPB 1500 mg/300 mL  Status:  Discontinued        1,500 mg 150 mL/hr over 120 Minutes Intravenous Every 24 hours 01/16/21 1732 01/17/21 1216   01/16/21 2200  metroNIDAZOLE (FLAGYL) IVPB 500 mg  Status:  Discontinued        500 mg 100 mL/hr over 60 Minutes Intravenous Every 8 hours 01/16/21 1622 01/17/21 1216   01/16/21 2000  vancomycin (VANCOCIN) 50 mg/mL oral solution 125 mg  Status:  Discontinued        125 mg Oral 4 times daily 01/16/21 1805 01/23/21 0859   01/16/21 1800  ceFEPIme (MAXIPIME) 2 g in sodium chloride 0.9 % 100 mL IVPB  Status:  Discontinued        2 g 200 mL/hr over 30 Minutes Intravenous Every 8 hours 01/16/21 1728 01/17/21 1216   01/16/21 1545  vancomycin (VANCOCIN) 50 mg/mL oral solution 125 mg  Status:  Discontinued        125 mg Oral 4 times daily 01/16/21 1539 01/16/21 1622   01/16/21 1445  metroNIDAZOLE (FLAGYL) IVPB 500 mg        500 mg 100 mL/hr over 60 Minutes Intravenous  Once 01/16/21 1438 01/16/21 1622   01/16/21 1330  vancomycin (VANCOCIN) IVPB 1000 mg/200 mL premix        1,000 mg 200 mL/hr over 60 Minutes Intravenous  Once 01/16/21 1320 01/16/21 1538  Subjective: Patient was seen and examined at bedside and was  feeling depressed today and still having abdominal pain but nausea was improving.  She was going to get a drain manipulation.  Ended up having an acute DVT so she was started on a heparin drip.  Feels overwhelmed with all the stuff that is been going on.  No other concerns or complaints at this time.  Objective: Vitals:   02/01/21 1526 02/01/21 1850 02/01/21 2142 02/02/21 0641  BP: (!) 128/91 126/86 (!) 141/88 127/88  Pulse: 85 (!) 58 80 73  Resp:  18 20 16   Temp:  97.7 F (36.5 C) 98.9 F (37.2 C) 98.2 F (36.8 C)  TempSrc:  Oral Oral Oral  SpO2: 97%  92% 90%  Weight:      Height:        Intake/Output Summary (Last 24 hours) at 02/02/2021 1458 Last data filed at 02/02/2021 0263 Gross per 24 hour  Intake 945 ml  Output 675 ml  Net 270 ml   Filed Weights   01/16/21 1700  Weight: 102.1 kg   Examination: Physical Exam:  Constitutional: WN/WD chronically ill-appearing obese Caucasian female in mild distress appears tearful and little uncomfortable Eyes: Lids and conjunctivae normal, sclerae anicteric  ENMT: External Ears, Nose appear normal. Grossly normal hearing.  Neck: Appears normal, supple, no cervical masses, normal ROM, no appreciable thyromegaly; no appreciable JVD Respiratory: Managed to auscultation bilaterally with coarse breath sounds, no wheezing, rales, rhonchi or crackles. Normal respiratory effort and patient is not tachypenic. No accessory muscle use.  Unlabored breathing Cardiovascular: RRR, no murmurs / rubs / gallops. S1 and S2 auscultated.  Some lower extremity edema worse in the right compared to left Abdomen: Soft, tender to palpate, distended secondary body habitus.  Abdominal drain in place. bowel sounds positive.  GU: Deferred. Musculoskeletal: No clubbing / cyanosis of digits/nails. No joint deformity upper and lower extremities.  Skin: No rashes, lesions, ulcers. No induration; Warm and dry.  Neurologic: CN 2-12 grossly intact with no focal deficits.   Romberg sign and cerebellar reflexes not assessed.  Psychiatric: Normal judgment and insight. Alert and oriented x 3.  Tearful and anxious mood and appropriate affect.   Data Reviewed: I have personally reviewed following labs and imaging studies  CBC: Recent Labs  Lab 01/29/21 0601 01/30/21 0531 01/31/21 0607 02/01/21 0600 02/02/21 0515  WBC 10.7* 7.8 8.8 9.4 7.9  NEUTROABS 4.5 2.2 2.9 2.9 2.6  HGB 10.6* 10.7* 10.6* 10.4* 10.2*  HCT 34.1* 34.7* 35.2* 33.1* 33.4*  MCV 100.3* 99.4 101.4* 100.0 100.0  PLT 383 417* 415* 394 785   Basic Metabolic Panel: Recent Labs  Lab 01/29/21 0601 01/30/21 0531 01/31/21 0607 02/01/21 0600 02/02/21 0515  NA 136 141 137 138 137  K 4.0 4.3 3.8 3.6 3.5  CL 103 104 103 107 107  CO2 24 28 25 25 23   GLUCOSE 109* 116* 118* 117* 107*  BUN 10 9 11 10 6   CREATININE 0.54 0.53 0.64 0.59 0.45  CALCIUM 8.9 9.0 8.7* 8.4* 8.2*  MG 1.9 2.0 1.9 1.8 1.9  PHOS 3.4 3.7 4.0 3.6 3.3   GFR: Estimated Creatinine Clearance: 92.4 mL/min (by C-G formula based on SCr of 0.45 mg/dL). Liver Function Tests: Recent Labs  Lab 01/29/21 0601 01/30/21 0531 01/31/21 0607 02/01/21 0600 02/02/21 0515  AST 30 29 35 26 30  ALT 16 14 14 13 13   ALKPHOS 261* 265* 269* 277* 253*  BILITOT 0.8 0.6 0.7 0.6 0.7  PROT 6.5 6.9 6.7 6.4* 6.4*  ALBUMIN 2.3* 2.4* 2.4* 2.3* 2.3*   No results for input(s): LIPASE, AMYLASE in the last 168 hours. No results for input(s): AMMONIA in the last 168 hours. Coagulation Profile: Recent Labs  Lab 02/02/21 0515  INR 1.2   Cardiac Enzymes: No results for input(s): CKTOTAL, CKMB, CKMBINDEX, TROPONINI in the last 168 hours. BNP (last 3 results) No results for input(s): PROBNP in the last 8760 hours. HbA1C: No results for input(s): HGBA1C in the last 72 hours. CBG: No results for input(s): GLUCAP in the last 168 hours. Lipid Profile: No results for input(s): CHOL, HDL, LDLCALC, TRIG, CHOLHDL, LDLDIRECT in the last 72 hours. Thyroid  Function Tests: No results for input(s): TSH, T4TOTAL, FREET4, T3FREE, THYROIDAB in the last 72 hours. Anemia Panel: No results for input(s): VITAMINB12, FOLATE, FERRITIN, TIBC, IRON, RETICCTPCT in the last 72 hours. Sepsis Labs: No results for input(s): PROCALCITON, LATICACIDVEN in the last 168 hours.  Recent Results (from the past 240 hour(s))  Aerobic/Anaerobic Culture (surgical/deep wound)     Status: None   Collection Time: 01/27/21  3:47 PM   Specimen: Abscess  Result Value Ref Range Status   Specimen Description   Final    ABSCESS POST CT GUIDED DRAIN Performed at [facility] [address], [place], [place] [address]    Special Requests   Final    NONE Performed at [facility], [place] [address] [place], [place] [address]    Gram Stain   Final    ABUNDANT WBC PRESENT, PREDOMINANTLY PMN NO ORGANISMS SEEN    Culture   Final    FEW KLEBSIELLA PNEUMONIAE NO ANAEROBES ISOLATED Performed at [facility], [place] [address] [place], [place] [address]    Report Status 02/02/2021 FINAL  Final   Organism ID, Bacteria KLEBSIELLA PNEUMONIAE  Final      Susceptibility   Klebsiella pneumoniae - MIC*    AMPICILLIN >=32 RESISTANT Resistant     CEFAZOLIN <=4 SENSITIVE Sensitive     CEFEPIME <=0.12 SENSITIVE Sensitive     CEFTAZIDIME <=1 SENSITIVE Sensitive     CEFTRIAXONE <=0.25 SENSITIVE Sensitive     CIPROFLOXACIN <=0.25 SENSITIVE Sensitive     GENTAMICIN <=1 SENSITIVE Sensitive     IMIPENEM <=0.25 SENSITIVE Sensitive     TRIMETH/SULFA <=20 SENSITIVE Sensitive     AMPICILLIN/SULBACTAM 4 SENSITIVE Sensitive     PIP/TAZO <=4 SENSITIVE Sensitive     * FEW KLEBSIELLA PNEUMONIAE     RN Pressure Injury Documentation:     Estimated body mass index is 36.32 kg/m as calculated from the following:   Height as of this encounter: 5\' 6"  (1.676 m).   Weight as of this encounter: 102.1 kg.  Malnutrition Type:  Nutrition Problem: Increased  nutrient needs Etiology: cancer and cancer related treatments   Malnutrition Characteristics:  Signs/Symptoms: estimated needs   Nutrition Interventions:  Interventions: Juven,MVI,Premier Protein   Radiology Studies: CT ABDOMEN PELVIS W CONTRAST  Result Date: 02/02/2021 CLINICAL DATA:  Follow-up abdominal abscess. Status post drain placement on 01/27/2021. History of appendiceal cancer. EXAM: CT ABDOMEN AND PELVIS WITH CONTRAST TECHNIQUE: Multidetector CT imaging of the abdomen and pelvis was performed using the standard protocol following bolus administration of intravenous contrast. CONTRAST:  165mL OMNIPAQUE IOHEXOL 300 MG/ML  SOLN COMPARISON:  CT abdomen 01/26/2021 FINDINGS: Lower chest: No pleural effusions. Scattered pleural-based densities are suggestive for atelectasis or mild scarring. No significant airspace disease or consolidation at the lung  bases. Hepatobiliary: Liver has a nodular contour and similar to the previous examination. Findings are suspicious for cirrhosis. The gallbladder has been removed. Intrahepatic portal venous system is patent. No focal liver lesion. Pancreas: Unremarkable. No pancreatic ductal dilatation or surrounding inflammatory changes. Common bile duct measures 8 mm and stable. Spleen: Surgically removed. Adrenals/Urinary Tract: Normal appearance of the adrenal glands. Normal appearance of both kidneys without hydronephrosis. Mild fullness of the renal pelvis bilaterally. Normal appearance of the urinary bladder. Stomach/Bowel: Extensive postsurgical bowel changes with bowel anastomotic clips involving the rectosigmoid region and multiple bowel surgical clips in the right lower quadrant of the abdomen. Multiple loops of bowel are in close proximity along the anterior abdominal wall associated with the abdominal wall fluid collection and percutaneous drain. No significant bowel dilatation and no evidence for bowel obstruction. Diffuse mesenteric edema without  focal bowel inflammation. Vascular/Lymphatic: Minimal atherosclerotic disease in the abdominal aorta without aneurysm. Main visceral arteries are patent. Again noted is an IVC filter in the infrarenal IVC with filter legs penetrating through the IVC. Again noted is a filling defect involving the superior mesenteric vein and a branch extending into the right abdomen. There is concern that this filling defect is now extending into the main portal vein on sequence 4, image 40. In addition, there is concern for thrombus involving the right common femoral vein and possibly the right external iliac vein but difficult to differentiate thrombus from mixing of blood and contrast in these area. Reproductive: Status post hysterectomy. No adnexal masses. Other: A percutaneous drain has been placed at the midline and it is extending through the anterior abdominal wall fluid collection. This drain terminates in the midline of the abdomen near loops of bowel. The drain is positioned near surgical bowel clips. There is a low-density collection near the drain which measures up to 3.2 cm on sequence 2, image 53 and not clear if this is a bowel loop or extraluminal collection. There continues to be a large amount of fluid within the anterior abdominal wall which has minimally decreased since placement of the drain. The fluid collection on the sagittal images on sequence 5 image 62 measures 10.1 x 2.8 cm and previously measured 10.5 x 3.3 cm. There is a small amount of gas within the superior aspect of the collection. No new abscess collections. Musculoskeletal: Bilateral pedicle screw and rod fixation at L4-L5. Interbody device at L4-L5. Again noted is severe disc space loss at L3-L4. No acute bone abnormality. IMPRESSION: 1. Anterior abdominal wall fluid collection has minimally decreased in size since placement of the percutaneous drain. Percutaneous drain extends through the collection and terminates in the midline of the abdomen  near loops of bowel. Fluid collection near the tip of the catheter within the abdomen could represent a loop of bowel versus extraluminal fluid collection. This collection may be further evaluated with a drain injection. 2. Concern for propagation of clot in the superior mesenteric vein extending into the main portal vein. In addition, there are is concern for DVT in the right groin extending into the right external iliac vein. Consider further evaluation of the lower extremities with venous duplex imaging. 3. Persistent and slightly increased mesenteric edema. Mesenteric edema is likely related to the postsurgical changes, intra-abdominal / abdominal wall abscess and probably the mesenteric venous thrombus. 4. Liver has a nodular contour and suspicious for cirrhosis. These results were called by telephone at the time of interpretation on 02/02/2021 at 8:02 am to provider [facility], DO ,  who verbally acknowledged these results. Electronically Signed   By: Markus Daft M.D.   On: 02/02/2021 08:04   VAS [place] LOWER EXTREMITY VENOUS (DVT)  Result Date: 02/02/2021  Lower Venous DVT Study Indications: Edema.  Risk Factors: None identified. Limitations: Body habitus and poor ultrasound/tissue interface. Comparison Study: No prior studies. Performing Technologist: Oliver Hum RVT  Examination Guidelines: A complete evaluation includes B-mode imaging, spectral Doppler, color Doppler, and power Doppler as needed of all accessible portions of each vessel. Bilateral testing is considered an integral part of a complete examination. Limited examinations for reoccurring indications may be performed as noted. The reflux portion of the exam is performed with the patient in reverse Trendelenburg.  +---------+---------------+---------+-----------+----------+--------------+ RIGHT    CompressibilityPhasicitySpontaneityPropertiesThrombus Aging +---------+---------------+---------+-----------+----------+--------------+  CFV      None           No       No                   Acute          +---------+---------------+---------+-----------+----------+--------------+ SFJ      None                                         Acute          +---------+---------------+---------+-----------+----------+--------------+ FV Prox  Full                                                        +---------+---------------+---------+-----------+----------+--------------+ FV Mid   Full                                                        +---------+---------------+---------+-----------+----------+--------------+ FV DistalFull                                                        +---------+---------------+---------+-----------+----------+--------------+ PFV      Full                                                        +---------+---------------+---------+-----------+----------+--------------+ POP      Partial        Yes      Yes                  Acute          +---------+---------------+---------+-----------+----------+--------------+ PTV      Partial                                      Acute          +---------+---------------+---------+-----------+----------+--------------+ PERO     Partial  Acute          +---------+---------------+---------+-----------+----------+--------------+ Gastroc  Full                                                        +---------+---------------+---------+-----------+----------+--------------+ Thrombus located in the CFV is noted to only be in the distal segment.  +---------+---------------+---------+-----------+----------+--------------+ LEFT     CompressibilityPhasicitySpontaneityPropertiesThrombus Aging +---------+---------------+---------+-----------+----------+--------------+ CFV      Full           Yes      Yes                                  +---------+---------------+---------+-----------+----------+--------------+ SFJ      Full                                                        +---------+---------------+---------+-----------+----------+--------------+ FV Prox  Full                                                        +---------+---------------+---------+-----------+----------+--------------+ FV Mid   Full                                                        +---------+---------------+---------+-----------+----------+--------------+ FV DistalFull                                                        +---------+---------------+---------+-----------+----------+--------------+ PFV      Full                                                        +---------+---------------+---------+-----------+----------+--------------+ POP      Full           Yes      Yes                                 +---------+---------------+---------+-----------+----------+--------------+ PTV      Full                                                        +---------+---------------+---------+-----------+----------+--------------+ PERO     Full                                                        +---------+---------------+---------+-----------+----------+--------------+  Summary: RIGHT: - Findings consistent with acute deep vein thrombosis involving the right common femoral vein, right popliteal vein, right posterior tibial veins, and right peroneal veins. - No cystic structure found in the popliteal fossa.  LEFT: - There is no evidence of deep vein thrombosis in the lower extremity. However, portions of this examination were limited- see technologist comments above.  *See table(s) above for measurements and observations. Electronically signed by Deitra Mayo MD on 02/02/2021 at 1:50:40 PM.    Final    Scheduled Meds: . amLODipine  10 mg Oral Daily  . busPIRone  5 mg Oral Q breakfast   And  .  busPIRone  5 mg Oral Q1200   And  . busPIRone  10 mg Oral QHS  . cefdinir  300 mg Oral Q12H  . famotidine  40 mg Oral Daily  . fentaNYL  1 patch Transdermal Q72H  . Gerhardt's butt cream   Topical QID  . multivitamin with minerals  1 tablet Oral Daily  . polycarbophil  625 mg Oral Daily  . propranolol  20 mg Oral BID  . protein supplement  1 Scoop Oral TID WC  . saccharomyces boulardii  250 mg Oral BID  . scopolamine  1 patch Transdermal Q72H  . sodium chloride flush  3 mL Intravenous Q12H  . sodium chloride flush  5 mL Intracatheter Q8H  . vancomycin  125 mg Oral Q12H   Continuous Infusions: . heparin 1,400 Units/hr (02/02/21 0945)  . metronidazole 500 mg (02/02/21 1356)    LOS: 17 days   Kerney Elbe, DO [facility] PAGER is on [place]  If 7PM-7AM, please contact night-coverage [email or web address]

## 2021-02-02 NOTE — Progress Notes (Signed)
Referring Physician(s): Ennever,P  Supervising Physician: Jacqulynn Cadet  Patient Status:  [facility] - In-pt  Chief Complaint:  Abdominal pain/abscess, nausea, loose stools  Subjective: Pt cont to have some mid lower abd discomfort, loose stools, occ nausea; denies fever/chills/ resp issues or vomiting   Allergies: Penicillins, Alprazolam, Ativan [lorazepam], Corticosteroids, Erythromycin, Erythromycin base, Milnacipran hcl, Prednisone, Savella  [milnacipran], Povidone iodine, and Prednisolone  Medications: Prior to Admission medications   Medication Sig Start Date End Date Taking? Authorizing Provider  busPIRone (BUSPAR) 5 MG tablet Take 5 mg by mouth See admin instructions. Takes 1 tablet in the morning, 1 tablet in the afternoon and 2 tablets at night   Yes [provider]  diphenoxylate-atropine (LOMOTIL) 2.5-0.025 MG tablet Take 1 tablet by mouth daily as needed for diarrhea or loose stools. 01/15/21  Yes [provider]  enoxaparin (LOVENOX) 40 MG/0.4ML injection Inject 40 mg into the skin daily. 01/01/21  Yes [provider]  HYDROmorphone (DILAUDID) 2 MG tablet Take 2 mg by mouth every 4 (four) hours as needed for moderate pain. 01/08/21  Yes [provider]  loperamide (IMODIUM) 2 MG capsule Take 4 mg by mouth in the morning, at noon, in the evening, and at bedtime. 01/01/21  Yes [provider]  ondansetron (ZOFRAN) 8 MG tablet Take 8 mg by mouth every 8 (eight) hours as needed for nausea or vomiting. 02/26/20  Yes [provider]  pantoprazole (PROTONIX) 40 MG tablet Take 40 mg by mouth every morning. 01/01/21  Yes [provider]  diazepam (VALIUM) 5 MG tablet Take 40 minutes prior to MRI Patient not taking: No sig reported 07/16/20   Volanda Napoleon, MD  insulin glargine (LANTUS) 100 UNIT/ML injection Inject 0.1 mLs (10 Units total) into the skin at bedtime. Before bed Patient not taking: No sig reported 11/26/19    Nita Sells, MD  insulin lispro (HUMALOG) 100 UNIT/ML injection Inject 0.08 mLs (8 Units total) into the skin 3 (three) times daily before meals. Patient not taking: No sig reported 11/26/19   Nita Sells, MD     Vital Signs: BP 127/88 (BP Location: Right Arm)   Pulse 73   Temp 98.2 F (36.8 C) (Oral)   Resp 16   Ht 5\' 6"  (1.676 m)   Wt 225 lb (102.1 kg)   SpO2 90%   BMI 36.32 kg/m   Physical Exam awake/alert; chest- CTA bilat ant; heart- RRR; abd- soft, few BS, tender mid  yellow fluid/lower abd region; mid abd drain intact, OP 175 cc yellow fluid  Imaging: CT ABDOMEN PELVIS W CONTRAST  Result Date: 02/02/2021 CLINICAL DATA:  Follow-up abdominal abscess. Status post drain placement on 01/27/2021. History of appendiceal cancer. EXAM: CT ABDOMEN AND PELVIS WITH CONTRAST TECHNIQUE: Multidetector CT imaging of the abdomen and pelvis was performed using the standard protocol following bolus administration of intravenous contrast. CONTRAST:  136mL OMNIPAQUE IOHEXOL 300 MG/ML  SOLN COMPARISON:  CT abdomen 01/26/2021 FINDINGS: Lower chest: No pleural effusions. Scattered pleural-based densities are suggestive for atelectasis or mild scarring. No significant airspace disease or consolidation at the lung bases. Hepatobiliary: Liver has a nodular contour and similar to the previous examination. Findings are suspicious for cirrhosis. The gallbladder has been removed. Intrahepatic portal venous system is patent. No focal liver lesion. Pancreas: Unremarkable. No pancreatic ductal dilatation or surrounding inflammatory changes. Common bile duct measures 8 mm and stable. Spleen: Surgically removed. Adrenals/Urinary Tract: Normal appearance of the adrenal glands. Normal appearance of both kidneys  without hydronephrosis. Mild fullness of the renal pelvis bilaterally. Normal appearance of the urinary bladder. Stomach/Bowel: Extensive postsurgical bowel changes with bowel anastomotic clips  involving the rectosigmoid region and multiple bowel surgical clips in the right lower quadrant of the abdomen. Multiple loops of bowel are in close proximity along the anterior abdominal wall associated with the abdominal wall fluid collection and percutaneous drain. No significant bowel dilatation and no evidence for bowel obstruction. Diffuse mesenteric edema without focal bowel inflammation. Vascular/Lymphatic: Minimal atherosclerotic disease in the abdominal aorta without aneurysm. Main visceral arteries are patent. Again noted is an IVC filter in the infrarenal IVC with filter legs penetrating through the IVC. Again noted is a filling defect involving the superior mesenteric vein and a branch extending into the right abdomen. There is concern that this filling defect is now extending into the main portal vein on sequence 4, image 40. In addition, there is concern for thrombus involving the right common femoral vein and possibly the right external iliac vein but difficult to differentiate thrombus from mixing of blood and contrast in these area. Reproductive: Status post hysterectomy. No adnexal masses. Other: A percutaneous drain has been placed at the midline and it is extending through the anterior abdominal wall fluid collection. This drain terminates in the midline of the abdomen near loops of bowel. The drain is positioned near surgical bowel clips. There is a low-density collection near the drain which measures up to 3.2 cm on sequence 2, image 53 and not clear if this is a bowel loop or extraluminal collection. There continues to be a large amount of fluid within the anterior abdominal wall which has minimally decreased since placement of the drain. The fluid collection on the sagittal images on sequence 5 image 62 measures 10.1 x 2.8 cm and previously measured 10.5 x 3.3 cm. There is a small amount of gas within the superior aspect of the collection. No new abscess collections. Musculoskeletal:  Bilateral pedicle screw and rod fixation at L4-L5. Interbody device at L4-L5. Again noted is severe disc space loss at L3-L4. No acute bone abnormality. IMPRESSION: 1. Anterior abdominal wall fluid collection has minimally decreased in size since placement of the percutaneous drain. Percutaneous drain extends through the collection and terminates in the midline of the abdomen near loops of bowel. Fluid collection near the tip of the catheter within the abdomen could represent a loop of bowel versus extraluminal fluid collection. This collection may be further evaluated with a drain injection. 2. Concern for propagation of clot in the superior mesenteric vein extending into the main portal vein. In addition, there are is concern for DVT in the right groin extending into the right external iliac vein. Consider further evaluation of the lower extremities with venous duplex imaging. 3. Persistent and slightly increased mesenteric edema. Mesenteric edema is likely related to the postsurgical changes, intra-abdominal / abdominal wall abscess and probably the mesenteric venous thrombus. 4. Liver has a nodular contour and suspicious for cirrhosis. These results were called by telephone at the time of interpretation on 02/02/2021 at 8:02 am to provider [facility], DO , who verbally acknowledged these results. Electronically Signed   By: Markus Daft M.D.   On: 02/02/2021 08:04    Labs:  CBC: Recent Labs    01/30/21 0531 01/31/21 0607 02/01/21 0600 02/02/21 0515  WBC 7.8 8.8 9.4 7.9  HGB 10.7* 10.6* 10.4* 10.2*  HCT 34.7* 35.2* 33.1* 33.4*  PLT 417* 415* 394 251    COAGS: Recent  Labs    02/02/21 0515  INR 1.2    BMP: Recent Labs    03/19/20 1109 05/01/20 1156 07/27/20 0922 10/28/20 0956 01/30/21 0531 01/31/21 0607 02/01/21 0600 02/02/21 0515  NA 143 141 143   < > 141 137 138 137  K 3.6 3.7 4.1   < > 4.3 3.8 3.6 3.5  CL 105 104 103   < > 104 103 107 107  CO2 30 27 33*   < > 28 25 25  23   GLUCOSE 133* 102* 160*   < > 116* 118* 117* 107*  BUN 16 21* 19   < > 9 11 10 6   CALCIUM 10.2 10.5* 10.4*   < > 9.0 8.7* 8.4* 8.2*  CREATININE 0.78 1.05* 0.83   < > 0.53 0.64 0.59 0.45  GFRNONAA >60 59* >60   < > >60 >60 >60 >60  GFRAA >60 >60 >60  --   --   --   --   --    < > = values in this interval not displayed.    LIVER FUNCTION TESTS: Recent Labs    01/30/21 0531 01/31/21 0607 02/01/21 0600 02/02/21 0515  BILITOT 0.6 0.7 0.6 0.7  AST 29 35 26 30  ALT 14 14 13 13   ALKPHOS 265* 269* 277* 253*  PROT 6.9 6.7 6.4* 6.4*  ALBUMIN 2.4* 2.4* 2.3* 2.3*    Assessment and Plan: Patient with history of metastatic appendiceal cancer, pseudomyxoma peritonei and peritoneal carcinomatosis, status post CRS/HI PEC and splenectomy in 2005, recurrent cancer status post HI PEC and debulking on 12/10/2020 in [place]; complicated by enterotomy requiring return to the OR on postop day 4 and then again for closure of open midline wound; now with postop abdominal wall fluid collection and deep pelvic collection on CT; status post drainage of midline abdominal fluid collection on 4/6; afebrile; WBC nl; hgb 10.2, creat nl; drain fl cx- few klebsiella; f/u CT A/P yesterday revealed:  1. Anterior abdominal wall fluid collection has minimally decreased in size since placement of the percutaneous drain. Percutaneous drain extends through the collection and terminates in the midline of the abdomen near loops of bowel. Fluid collection near the tip of the catheter within the abdomen could represent a loop of bowel versus extraluminal fluid collection. This collection may be further evaluated with a drain injection. 2. Concern for propagation of clot in the superior mesenteric vein extending into the main portal vein. In addition, there are is concern for DVT in the right groin extending into the right external iliac vein. Consider further evaluation of the lower extremities with venous  duplex imaging. 3. Persistent and slightly increased mesenteric edema. Mesenteric edema is likely related to the postsurgical changes, intra-abdominal / abdominal wall abscess and probably the mesenteric venous thrombus. 4. Liver has a nodular contour and suspicious for cirrhosis.  Images were reviewed yesterday by Dr. Anselm Pancoast and today by Dr. Leonie Man; plan is for image guided abd drain manipulation today to expedite drainage of residual ant abd wall collection. Findings d/w pt with her understanding and consent; IV heparin also initiated by [facility] due to concerns for additional DVT  Electronically Signed: D. Rowe Robert, PA-C 02/02/2021, 9:49 AM   I spent a total of 15 minutes at the the patient's bedside AND on the patient's hospital floor or unit, greater than 50% of which was counseling/coordinating care for abdominal abscess drain/drain manipulation    Patient ID: Wendy Barber, female   DOB: 04/03/1961, 60 y.o.  MRN: [id number]

## 2021-02-02 NOTE — Progress Notes (Signed)
Subjective/Chief Complaint: Ongoing abd pain described as lower abd pressure. Reports nausea but no emesis. Still reports dizziness with standing. Having BMs. Tearful - states she just wants to feel better, has been a long time since she felt ok.    Objective: Vital signs in last 24 hours: Temp:  [97.7 F (36.5 C)-98.9 F (37.2 C)] 98.2 F (36.8 C) (04/12 0641) Pulse Rate:  [58-88] 73 (04/12 0641) Resp:  [16-20] 16 (04/12 0641) BP: (126-147)/(72-91) 127/88 (04/12 0641) SpO2:  [90 %-98 %] 90 % (04/12 0641) Last BM Date: 02/01/21  Intake/Output from previous day: 04/11 0701 - 04/12 0700 In: 1315 [P.O.:1310] Out: 775 [Urine:600; Drains:175] Intake/Output this shift: No intake/output data recorded.  Ab drain with enteric appearing contents (light brown/yellow, looks like small intestinal contents), 175 cc/24h, abdomen is soft, non-distended, appropriately tender. Laparotomy scar present  Lab Results:  Recent Labs    02/01/21 0600 02/02/21 0515  WBC 9.4 7.9  HGB 10.4* 10.2*  HCT 33.1* 33.4*  PLT 394 251   BMET Recent Labs    02/01/21 0600 02/02/21 0515  NA 138 137  K 3.6 3.5  CL 107 107  CO2 25 23  GLUCOSE 117* 107*  BUN 10 6  CREATININE 0.59 0.45  CALCIUM 8.4* 8.2*   PT/INR Recent Labs    02/02/21 0515  LABPROT 14.4  INR 1.2   ABG No results for input(s): PHART, HCO3 in the last 72 hours.  Invalid input(s): PCO2, PO2  Studies/Results: CT ABDOMEN PELVIS W CONTRAST  Result Date: 02/02/2021 CLINICAL DATA:  Follow-up abdominal abscess. Status post drain placement on 01/27/2021. History of appendiceal cancer. EXAM: CT ABDOMEN AND PELVIS WITH CONTRAST TECHNIQUE: Multidetector CT imaging of the abdomen and pelvis was performed using the standard protocol following bolus administration of intravenous contrast. CONTRAST:  142mL OMNIPAQUE IOHEXOL 300 MG/ML  SOLN COMPARISON:  CT abdomen 01/26/2021 FINDINGS: Lower chest: No pleural effusions. Scattered  pleural-based densities are suggestive for atelectasis or mild scarring. No significant airspace disease or consolidation at the lung bases. Hepatobiliary: Liver has a nodular contour and similar to the previous examination. Findings are suspicious for cirrhosis. The gallbladder has been removed. Intrahepatic portal venous system is patent. No focal liver lesion. Pancreas: Unremarkable. No pancreatic ductal dilatation or surrounding inflammatory changes. Common bile duct measures 8 mm and stable. Spleen: Surgically removed. Adrenals/Urinary Tract: Normal appearance of the adrenal glands. Normal appearance of both kidneys without hydronephrosis. Mild fullness of the renal pelvis bilaterally. Normal appearance of the urinary bladder. Stomach/Bowel: Extensive postsurgical bowel changes with bowel anastomotic clips involving the rectosigmoid region and multiple bowel surgical clips in the right lower quadrant of the abdomen. Multiple loops of bowel are in close proximity along the anterior abdominal wall associated with the abdominal wall fluid collection and percutaneous drain. No significant bowel dilatation and no evidence for bowel obstruction. Diffuse mesenteric edema without focal bowel inflammation. Vascular/Lymphatic: Minimal atherosclerotic disease in the abdominal aorta without aneurysm. Main visceral arteries are patent. Again noted is an IVC filter in the infrarenal IVC with filter legs penetrating through the IVC. Again noted is a filling defect involving the superior mesenteric vein and a branch extending into the right abdomen. There is concern that this filling defect is now extending into the main portal vein on sequence 4, image 40. In addition, there is concern for thrombus involving the right common femoral vein and possibly the right external iliac vein but difficult to differentiate thrombus from mixing of blood and contrast in  these area. Reproductive: Status post hysterectomy. No adnexal masses.  Other: A percutaneous drain has been placed at the midline and it is extending through the anterior abdominal wall fluid collection. This drain terminates in the midline of the abdomen near loops of bowel. The drain is positioned near surgical bowel clips. There is a low-density collection near the drain which measures up to 3.2 cm on sequence 2, image 53 and not clear if this is a bowel loop or extraluminal collection. There continues to be a large amount of fluid within the anterior abdominal wall which has minimally decreased since placement of the drain. The fluid collection on the sagittal images on sequence 5 image 62 measures 10.1 x 2.8 cm and previously measured 10.5 x 3.3 cm. There is a small amount of gas within the superior aspect of the collection. No new abscess collections. Musculoskeletal: Bilateral pedicle screw and rod fixation at L4-L5. Interbody device at L4-L5. Again noted is severe disc space loss at L3-L4. No acute bone abnormality. IMPRESSION: 1. Anterior abdominal wall fluid collection has minimally decreased in size since placement of the percutaneous drain. Percutaneous drain extends through the collection and terminates in the midline of the abdomen near loops of bowel. Fluid collection near the tip of the catheter within the abdomen could represent a loop of bowel versus extraluminal fluid collection. This collection may be further evaluated with a drain injection. 2. Concern for propagation of clot in the superior mesenteric vein extending into the main portal vein. In addition, there are is concern for DVT in the right groin extending into the right external iliac vein. Consider further evaluation of the lower extremities with venous duplex imaging. 3. Persistent and slightly increased mesenteric edema. Mesenteric edema is likely related to the postsurgical changes, intra-abdominal / abdominal wall abscess and probably the mesenteric venous thrombus. 4. Liver has a nodular contour and  suspicious for cirrhosis. These results were called by telephone at the time of interpretation on 02/02/2021 at 8:02 am to provider [facility], DO , who verbally acknowledged these results. Electronically Signed   By: Markus Daft M.D.   On: 02/02/2021 08:04    Anti-infectives: Anti-infectives (From admission, onward)   Start     Dose/Rate Route Frequency Ordered Stop   02/01/21 2200  metroNIDAZOLE (FLAGYL) IVPB 500 mg        500 mg 100 mL/hr over 60 Minutes Intravenous Every 12 hours 02/01/21 1804     01/30/21 1000  vancomycin (VANCOCIN) 50 mg/mL oral solution 125 mg        125 mg Oral Every 12 hours 01/29/21 1431     01/29/21 2200  metroNIDAZOLE (FLAGYL) tablet 500 mg  Status:  Discontinued        500 mg Oral Every 12 hours 01/29/21 1700 02/01/21 1804   01/29/21 1800  metroNIDAZOLE (FLAGYL) tablet 500 mg  Status:  Discontinued        500 mg Oral Every 8 hours 01/29/21 1431 01/29/21 1700   01/29/21 1530  cefdinir (OMNICEF) capsule 300 mg        300 mg Oral Every 12 hours 01/29/21 1431     01/28/21 1800  metroNIDAZOLE (FLAGYL) IVPB 500 mg  Status:  Discontinued        500 mg 100 mL/hr over 60 Minutes Intravenous Every 8 hours 01/28/21 1517 01/29/21 1431   01/28/21 1600  ciprofloxacin (CIPRO) IVPB 400 mg  Status:  Discontinued        400 mg 200 mL/hr  over 60 Minutes Intravenous Every 12 hours 01/28/21 1517 01/29/21 1431   01/23/21 1315  vancomycin (VANCOCIN) 50 mg/mL oral solution 125 mg        125 mg Oral Every 6 hours 01/23/21 1226 01/29/21 2359   01/23/21 1000  fidaxomicin (DIFICID) tablet 200 mg  Status:  Discontinued        200 mg Oral 2 times daily 01/23/21 0859 01/23/21 1226   01/17/21 0600  vancomycin (VANCOREADY) IVPB 1500 mg/300 mL  Status:  Discontinued        1,500 mg 150 mL/hr over 120 Minutes Intravenous Every 24 hours 01/16/21 1732 01/17/21 1216   01/16/21 2200  metroNIDAZOLE (FLAGYL) IVPB 500 mg  Status:  Discontinued        500 mg 100 mL/hr over 60 Minutes  Intravenous Every 8 hours 01/16/21 1622 01/17/21 1216   01/16/21 2000  vancomycin (VANCOCIN) 50 mg/mL oral solution 125 mg  Status:  Discontinued        125 mg Oral 4 times daily 01/16/21 1805 01/23/21 0859   01/16/21 1800  ceFEPIme (MAXIPIME) 2 g in sodium chloride 0.9 % 100 mL IVPB  Status:  Discontinued        2 g 200 mL/hr over 30 Minutes Intravenous Every 8 hours 01/16/21 1728 01/17/21 1216   01/16/21 1545  vancomycin (VANCOCIN) 50 mg/mL oral solution 125 mg  Status:  Discontinued        125 mg Oral 4 times daily 01/16/21 1539 01/16/21 1622   01/16/21 1445  metroNIDAZOLE (FLAGYL) IVPB 500 mg        500 mg 100 mL/hr over 60 Minutes Intravenous  Once 01/16/21 1438 01/16/21 1622   01/16/21 1330  vancomycin (VANCOCIN) IVPB 1000 mg/200 mL premix        1,000 mg 200 mL/hr over 60 Minutes Intravenous  Once 01/16/21 1320 01/16/21 1538      Assessment/Plan: Metastatic appendiceal cancer, pseudomyxoma peritonei and peritoneal carcinomatosis -S/PCRS/HIPECand splenectomy in 2005 -Recurrent cancer S/PHIPECand debulkingon 12/10/2020 in [place];complicated by enterotomy requiring return to the OR on postop day 4 and then again for closure of open midline wound -Postop abdominal wall fluid collection and deep pelvic collectionon CT - EC fistula  - repeat CT scan 4/5 showed slight interval increase in size of the anterior abdominal wall abscess which appears to communicate with a smaller intra-abdominal abscess; findings worrisome for a fistula; persistent fluid collection in the pelvis just above the bladder anteriorly, this could be a liquified hematoma or postoperative seroma - s/p IR drain placement 4/6 in abdominal wall fluid collection, output is now enteric appearing >> cx :FEW KLEBSIELLA PNEUMONIAE - no role for emergent surgery at this time. Vitals stable, Wbc normal. Abdominal wall fluid collection and EC fistula not adequately drained through initial IR drain. IR planning for  drain manipulation vs additional drain placement to obtain source control. - hep gtt per primary team for SMV and portal thrombus, R iliac vein  - on abx per ID CCS will follow   C diff colitis -on PO vancomycin, diarrhea improving  ID -PO vancomycin, cipro/flagyl 4/7>> FEN -NPO for possible procedure  VTE - hep gtt  Foley -none Dispo- follow up [place] 02/02/2021

## 2021-02-02 NOTE — Progress Notes (Signed)
Chaplain engaged in an initial visit with Wendy Barber and her husband.  Wendy Barber and her husband vocalized having issues around Wendy Barber's pain medication.  She stated that when she has asked for it, it takes a long time to receive it.  Wendy Barber stated that she normally needs it every three hours.  Both of them vocalized that instead of asking for it they would like a nurse to come in and check to see if she needs it within the two hour timeframe.  Mr. Zavaleta noted that his experience with this hospital has been different because nurses normally check in with his wife for pain medication.  Chaplain offered listening and support during this time.  Wendy Barber also shared how hard it has been to ask for help in using the bathroom.  She noted that she usually has to go urgently and that she rather do it herself.  Husband noted how important it is for her to ask for help because of the possibilities of passing out.  Chaplain affirmed how hard it can be to ask for help and depend on others to do something she used to do on her own.    Mr. Gashi is a Chaplain within the service and travels for work.  He asked that chaplain continue to check on Wendy Barber.  Chaplain will follow-up.  Chaplain offered listening and presence.     02/02/21 1600  Clinical Encounter Type  Visited With Patient and family together  Visit Type Initial;Spiritual support

## 2021-02-02 NOTE — Progress Notes (Signed)
Bilateral lower extremity venous duplex has been completed. Preliminary results can be found in CV Proc through chart review.  Results were given to the patient's nurse, Adline Peals.  02/02/21 12:06 PM Carlos Levering RVT

## 2021-02-02 NOTE — Progress Notes (Signed)
PT Cancellation Note  Patient Details Name: Wendy Barber MRN: [id number] DOB: [birth year]   Cancelled Treatment:    Reason Eval/Treat Not Completed: Medical issues which prohibited therapy (Dopplers pending, will follow.)   [name] PT 02/02/2021  Acute Rehabilitation Services Pager [phone] Office [phone]

## 2021-02-02 NOTE — Progress Notes (Signed)
[place] for heparin Indication: VTE  Allergies  Allergen Reactions  . Penicillins Shortness Of Breath    Other reaction(s): Irregular Heart Rate, Other (See Comments) Rapid heartrate   . Alprazolam Hives and Other (See Comments)    Note: tolerates midazolam fine unresponsive Hard to arouse unresponsive Hard to arouse  Other reaction(s): Other (See Comments) unresponsive  . Ativan [Lorazepam] Other (See Comments)    Note: tolerates midazolam fine Face & Throat Swelling.  . Corticosteroids Other (See Comments)    Other reaction(s): Other (see comments) Psychotic behaviour    . Erythromycin     Other reaction(s): Other (See Comments) Severe stomach pain   . Erythromycin Base Hives  . Milnacipran Hcl Other (See Comments)    Other reaction(s):  psychotic episode     . Prednisone Other (See Comments)    Anxiety & Nervous Breakdown.  Wendy Barber  [Milnacipran]   . Povidone Iodine Rash    Had rash under bandage after surgery , has had betadine since without reaction   . Prednisolone Anxiety    Patient Measurements: Height: 5\' 6"  (167.6 cm) Weight: 102.1 kg (225 lb) IBW/kg (Calculated) : 59.3 Heparin Dosing Weight: 82 kg  Vital Signs: BP: 131/80 (04/12 1600) Pulse Rate: 71 (04/12 1600)  Labs: Recent Labs    01/31/21 0607 02/01/21 0600 02/02/21 0515 02/02/21 1714  HGB 10.6* 10.4* 10.2*  --   HCT 35.2* 33.1* 33.4*  --   PLT 415* 394 251  --   LABPROT  --   --  14.4  --   INR  --   --  1.2  --   HEPARINUNFRC  --   --   --  <0.10*  CREATININE 0.64 0.59 0.45  --     Estimated Creatinine Clearance: 92.4 mL/min (by C-G formula based on SCr of 0.45 mg/dL).   Medications:  - on lovenox 40mg  daily PTA  Assessment: Pt's a 60 y.o. F with complicated medical hx that includes metastatic appendiceal cancer, autoimmune hepatitis, CRS/HIPEC for recurrent cancer, and PE/DVT (s/p IVC filter) presented to the ED on 3/26 with c/o  n/v/d.  During this admission, she was treated for cdiff and is currently on abx for abdominal surgical wound site infection.  Abdominal CT on 4/11 showed findings with "concern for propagation of clot in the superior mesenteric vein extending into the main portal vein" and "for DVT in the right groin extending into the right external iliac vein."  Pharmacy has been consulted to start heparin drip for VTE.  Adden: Pt is scheduled for drain replacement today by IR. Spoke to [employer], IR team is ok with starting heparin drip this morning and drip does not need to be held for procedure.   02/02/2021 First heparin level undetectable at <0.1 after 2500 unit bolus and drip at 1400 units/hr. Per RN: yes it's running, IV is fine. small bloody drainage in percutaneous tube placed in IR today     Goal of Therapy:  Heparin level 0.3-0.7 units/ml Monitor platelets by anticoagulation protocol: Yes   Plan:  - re-bolus heparin 2000 units IV x1, increase drip to 1700 units/hr - check 6 hr heparin level - monitor for s/sx bleeding -daily CBC & heparin level  Eudelia Bunch, Pharm.D 02/02/2021 7:37 PM

## 2021-02-03 ENCOUNTER — Inpatient Hospital Stay (HOSPITAL_COMMUNITY): Payer: Medicare Other

## 2021-02-03 DIAGNOSIS — T8149XA Infection following a procedure, other surgical site, initial encounter: Secondary | ICD-10-CM | POA: Diagnosis not present

## 2021-02-03 LAB — COMPREHENSIVE METABOLIC PANEL
ALT: 14 U/L (ref 0–44)
AST: 31 U/L (ref 15–41)
Albumin: 2.4 g/dL — ABNORMAL LOW (ref 3.5–5.0)
Alkaline Phosphatase: 269 U/L — ABNORMAL HIGH (ref 38–126)
Anion gap: 8 (ref 5–15)
BUN: 5 mg/dL — ABNORMAL LOW (ref 6–20)
CO2: 24 mmol/L (ref 22–32)
Calcium: 8.4 mg/dL — ABNORMAL LOW (ref 8.9–10.3)
Chloride: 107 mmol/L (ref 98–111)
Creatinine, Ser: 0.61 mg/dL (ref 0.44–1.00)
GFR, Estimated: >60 mL/min (ref >60)
Glucose, Bld: 116 mg/dL — ABNORMAL HIGH (ref 70–99)
Potassium: 3.4 mmol/L — ABNORMAL LOW (ref 3.5–5.1)
Sodium: 139 mmol/L (ref 135–145)
Total Bilirubin: 0.6 mg/dL (ref 0.3–1.2)
Total Protein: 6.6 g/dL (ref 6.5–8.1)

## 2021-02-03 LAB — MAGNESIUM: Magnesium: 1.8 mg/dL (ref 1.7–2.4)

## 2021-02-03 LAB — CBC WITH DIFFERENTIAL/PLATELET
Abs Immature Granulocytes: 0.06 10*3/uL (ref 0.00–0.07)
Basophils Absolute: 0.1 10*3/uL (ref 0.0–0.1)
Basophils Relative: 1 %
Eosinophils Absolute: 0.4 10*3/uL (ref 0.0–0.5)
Eosinophils Relative: 4 %
HCT: 34.7 % — ABNORMAL LOW (ref 36.0–46.0)
Hemoglobin: 10.9 g/dL — ABNORMAL LOW (ref 12.0–15.0)
Immature Granulocytes: 1 %
Lymphocytes Relative: 46 %
Lymphs Abs: 4.1 10*3/uL — ABNORMAL HIGH (ref 0.7–4.0)
MCH: 31.1 pg (ref 26.0–34.0)
MCHC: 31.4 g/dL (ref 30.0–36.0)
MCV: 99.1 fL (ref 80.0–100.0)
Monocytes Absolute: 1.1 10*3/uL — ABNORMAL HIGH (ref 0.1–1.0)
Monocytes Relative: 12 %
Neutro Abs: 3.2 10*3/uL (ref 1.7–7.7)
Neutrophils Relative %: 36 %
Platelets: 385 10*3/uL (ref 150–400)
RBC: 3.5 MIL/uL — ABNORMAL LOW (ref 3.87–5.11)
RDW: 19.7 % — ABNORMAL HIGH (ref 11.5–15.5)
WBC: 8.8 10*3/uL (ref 4.0–10.5)
nRBC: 0 % (ref 0.0–0.2)

## 2021-02-03 LAB — HEPARIN LEVEL (UNFRACTIONATED)
Heparin Unfractionated: 0.76 IU/mL — ABNORMAL HIGH (ref 0.30–0.70)
Heparin Unfractionated: 0.62 IU/mL (ref 0.30–0.70)
Heparin Unfractionated: 0.44 IU/mL (ref 0.30–0.70)

## 2021-02-03 LAB — PHOSPHORUS: Phosphorus: 3.3 mg/dL (ref 2.5–4.6)

## 2021-02-03 MED ORDER — FENTANYL 50 MCG/HR TD PT72
1.0000 | MEDICATED_PATCH | TRANSDERMAL | Status: DC
  Administered 2021-02-03 – 2021-02-09 (×3): 1 via TRANSDERMAL
  Filled 2021-02-03 (×3): qty 1

## 2021-02-03 NOTE — Progress Notes (Signed)
Occupational Therapy Treatment Patient Details Name: Wendy Barber MRN: [id number] DOB: Jun 18, 1961 Today's Date: 02/03/2021    History of present illness The patient is a 60 year old Caucasian female admitted with C. difficile colitis and for concern for postop wound infection.  Pt with hx of DVT and IVC filter placement, now with acute DVT (SMV, PVT, Iliac thrombus).  PMHx significant for but not limited to metastatic appendiceal cancer, pseudomyxoma peritonei and peritoneal carcinomatosis status post CRS/HIPEC and splenectomy in 2005 and recent CRS/HIPEC on 12/10/2020 for recurrent cancer, autoimmune hepatitis, DVT and PE with IVC filter currently on Lovenox, hypertension, diabetes mellitus type 2   OT comments  Patient motivated to participate with OT, limited by abdominal pain and dizziness in standing. Patient min G for functional transfer and ambulation to sink. Patient supervision level for oral care, leans heavily onto sink due to abdominal pain, reports dizziness and has to complete g/h in sitting. Patient fatigued with BADL task, recommend continued acute OT services to maximize patient endurance/overall strength in order to facilitate D/C home.    Follow Up Recommendations  No OT follow up    Equipment Recommendations  None recommended by OT       Precautions / Restrictions Precautions Precautions: Other (comment);Fall Precaution Comments: low abd wound & drain; reports history of unexplained syncope causing passing out (approx 4 x a year), orthostatic hypotension Restrictions Weight Bearing Restrictions: No       Mobility Bed Mobility Overal bed mobility: Modified Independent             General bed mobility comments: in recliner    Transfers Overall transfer level: Needs assistance Equipment used: None Transfers: Sit to/from Stand Sit to Stand: Min guard         General transfer comment: min G for safety    Balance Overall balance assessment: Mild  deficits observed, not formally tested                                         ADL either performed or assessed with clinical judgement   ADL Overall ADL's : Needs assistance/impaired     Grooming: Supervision/safety;Oral care;Wash/dry face;Sitting;Standing Grooming Details (indicate cue type and reason): due to dizziness patient had to finish g/h tasks in sitting, leans heavily onto sink while brushing teeth reports abdominal pain in standing                 Toilet Transfer: Min guard;Ambulation Toilet Transfer Details (indicate cue type and reason): min G for safety, no physical assistance to power up to standing from recliner chair         Functional mobility during ADLs: Min guard General ADL Comments: patient's pain and reports of dizziness impacting overall activity tolerance needed to safely participate in daily routine               Cognition Arousal/Alertness: Awake/alert Behavior During Therapy: WFL for tasks assessed/performed Overall Cognitive Status: Within Functional Limits for tasks assessed                                                General Comments Pt denies vertigo and states she hx of syncope episodes and dizziness which have been evaluated numerous times by neurologist and cardiologist (per pt).  Pertinent Vitals/ Pain       Pain Assessment: 0-10 Pain Score: 7  Faces Pain Scale: Hurts a little bit Pain Location: wound site Pain Descriptors / Indicators: Grimacing Pain Intervention(s): Monitored during session  Home Living Family/patient expects to be discharged to:: Private residence Living Arrangements: Spouse/significant other Available Help at Discharge: Family Type of Home: House Home Access: Stairs to enter Technical brewer of Steps: 3 Entrance Stairs-Rails: Left Home Layout: One level     Bathroom Shower/Tub: Walk-in shower         Home Equipment: Clinical cytogeneticist - 2  wheels;Bedside commode;Walker - 4 wheels          Prior Functioning/Environment Level of Independence: Needs assistance  Gait / Transfers Assistance Needed: independent ADL's / Homemaking Assistance Needed: a little help with LB dressing after surgery       Frequency  Min 2X/week        Progress Toward Goals  OT Goals(current goals can now be found in the care plan section)  Progress towards OT goals: Progressing toward goals  Acute Rehab OT Goals Patient Stated Goal: improve strength and endurance OT Goal Formulation: With patient Time For Goal Achievement: 02/14/21 Potential to Achieve Goals: Good ADL Goals Pt Will Transfer to Toilet: with modified independence Pt Will Perform Toileting - Clothing Manipulation and hygiene: with modified independence Pt/caregiver will Perform Home Exercise Program: Increased strength;Right Upper extremity;Left upper extremity;With theraband;With written HEP provided Additional ADL Goal #1: Patient will perform 10 min functional activity or exercise activity as evidence of improving activity tolerance  Plan Discharge plan remains appropriate       AM-PAC OT "6 Clicks" Daily Activity     Outcome Measure   Help from another person eating meals?: None Help from another person taking care of personal grooming?: A Little Help from another person toileting, which includes using toliet, bedpan, or urinal?: A Little Help from another person bathing (including washing, rinsing, drying)?: A Little Help from another person to put on and taking off regular upper body clothing?: A Little Help from another person to put on and taking off regular lower body clothing?: A Little 6 Click Score: 19    End of Session  OT Visit Diagnosis: Muscle weakness (generalized) (M62.81);Pain Pain - part of body:  (abdomen)   Activity Tolerance Patient tolerated treatment well;Patient limited by pain   Patient Left in chair;with call bell/phone within reach    Nurse Communication Mobility status        Time: 9628-3662 OT Time Calculation (min): 19 min  Charges: OT General Charges $OT Visit: 1 Visit OT Treatments $Self Care/Home Management : 8-22 mins  Delbert Phenix OT OT pager: [place] 02/03/2021, 12:12 PM

## 2021-02-03 NOTE — Progress Notes (Addendum)
Unfortunately, we have more problems now.  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  I am unsure how this is going to have to be managed.  I suppose she will have to go back up to [facility] for this.  She now has a thrombus in the right leg.  She was having some pain in the right leg.  She subsequently had a Doppler yesterday.  I must say this was a very good "call" by Dr. Alfredia Ferguson.  She had extensive thrombus.  I will know if the IVC filter is a problem.  I do not know if the filter is plugged up with clot.  We may have to figure out a way to check this out.    I would keep her on heparin for right now.  I am not sure she is going have any additional invasive studies done.  She has had more pain issues.  We will see about increasing the Duragesic patch to 50 mcg.  Her white count is 8.8.  Hemoglobin 10.9.  Platelet count 385,000.  Her alkaline phosphatase is 269.  Maybe this is why she had the ultrasound done today.  Her BUN is 5 creatinine 0.61.  On exam, I am really not impressed with any swelling in the right leg.  There is no firmness or erythema of the right leg.  There is no tenderness when I palpate the right lower leg or thigh.  Again, I do see that she has this thrombus now.  She was on anticoagulation prophylactically.  I think that having the surgery started not help.  Being the fact that she is really not that mobile is also a problem.  Again, I would just keep her on heparin for right now.  I think this is the best way to go until we know that she is not going to have any invasive studies.  I know this is a very complicated.  I appreciate all the great care that she is getting.  [name], MD  Job 2:10

## 2021-02-03 NOTE — Progress Notes (Addendum)
Referring Physician(s): Ennever,P  Supervising Physician: Mir, Biochemist, clinical  Patient Status:  [name] - In-pt  Chief Complaint:  Abdominal pain/abscess, nausea, loose stools  Subjective: Patient states that abdominal pain is slightly less since abd drain repositioned/upsized yesterday.  Continues to have some loose stools and intermittent nausea.   Allergies: Penicillins, Alprazolam, Ativan [lorazepam], Corticosteroids, Erythromycin, Erythromycin base, Milnacipran hcl, Prednisone, Savella  [milnacipran], Povidone iodine, and Prednisolone  Medications: Prior to Admission medications   Medication Sig Start Date End Date Taking? Authorizing Provider  busPIRone (BUSPAR) 5 MG tablet Take 5 mg by mouth See admin instructions. Takes 1 tablet in the morning, 1 tablet in the afternoon and 2 tablets at night   Yes [provider]  diphenoxylate-atropine (LOMOTIL) 2.5-0.025 MG tablet Take 1 tablet by mouth daily as needed for diarrhea or loose stools. 01/15/21  Yes [provider]  enoxaparin (LOVENOX) 40 MG/0.4ML injection Inject 40 mg into the skin daily. 01/01/21  Yes [provider]  HYDROmorphone (DILAUDID) 2 MG tablet Take 2 mg by mouth every 4 (four) hours as needed for moderate pain. 01/08/21  Yes [provider]  loperamide (IMODIUM) 2 MG capsule Take 4 mg by mouth in the morning, at noon, in the evening, and at bedtime. 01/01/21  Yes [provider]  ondansetron (ZOFRAN) 8 MG tablet Take 8 mg by mouth every 8 (eight) hours as needed for nausea or vomiting. 02/26/20  Yes [provider]  pantoprazole (PROTONIX) 40 MG tablet Take 40 mg by mouth every morning. 01/01/21  Yes [provider]  diazepam (VALIUM) 5 MG tablet Take 40 minutes prior to MRI Patient not taking: No sig reported 07/16/20   Volanda Napoleon, MD  insulin glargine (LANTUS) 100 UNIT/ML injection Inject 0.1 mLs (10 Units total) into the skin at bedtime. Before bed Patient  not taking: No sig reported 11/26/19   Nita Sells, MD  insulin lispro (HUMALOG) 100 UNIT/ML injection Inject 0.08 mLs (8 Units total) into the skin 3 (three) times daily before meals. Patient not taking: No sig reported 11/26/19   Nita Sells, MD     Vital Signs: BP (!) 154/91 (BP Location: Right Arm)   Pulse 71   Temp 98 F (36.7 C) (Oral)   Resp 14   Ht 5\' 6"  (1.676 m)   Wt 225 lb (102.1 kg)   SpO2 94%   BMI 36.32 kg/m   Physical Exam awake, alert;  lower mid abdominal drain intact, insertion site mildly tender, output 40 cc of turbid light pink fluid.  Drain irrigated without difficulty.  Imaging: CT ABDOMEN PELVIS W CONTRAST  Result Date: 02/02/2021 CLINICAL DATA:  Follow-up abdominal abscess. Status post drain placement on 01/27/2021. History of appendiceal cancer. EXAM: CT ABDOMEN AND PELVIS WITH CONTRAST TECHNIQUE: Multidetector CT imaging of the abdomen and pelvis was performed using the standard protocol following bolus administration of intravenous contrast. CONTRAST:  151mL OMNIPAQUE IOHEXOL 300 MG/ML  SOLN COMPARISON:  CT abdomen 01/26/2021 FINDINGS: Lower chest: No pleural effusions. Scattered pleural-based densities are suggestive for atelectasis or mild scarring. No significant airspace disease or consolidation at the lung bases. Hepatobiliary: Liver has a nodular contour and similar to the previous examination. Findings are suspicious for cirrhosis. The gallbladder has been removed. Intrahepatic portal venous system is patent. No focal liver lesion. Pancreas: Unremarkable. No pancreatic ductal dilatation or surrounding inflammatory changes. Common bile duct measures 8 mm and stable. Spleen: Surgically removed. Adrenals/Urinary Tract: Normal appearance of the adrenal glands. Normal appearance  of both kidneys without hydronephrosis. Mild fullness of the renal pelvis bilaterally. Normal appearance of the urinary bladder. Stomach/Bowel: Extensive postsurgical bowel  changes with bowel anastomotic clips involving the rectosigmoid region and multiple bowel surgical clips in the right lower quadrant of the abdomen. Multiple loops of bowel are in close proximity along the anterior abdominal wall associated with the abdominal wall fluid collection and percutaneous drain. No significant bowel dilatation and no evidence for bowel obstruction. Diffuse mesenteric edema without focal bowel inflammation. Vascular/Lymphatic: Minimal atherosclerotic disease in the abdominal aorta without aneurysm. Main visceral arteries are patent. Again noted is an IVC filter in the infrarenal IVC with filter legs penetrating through the IVC. Again noted is a filling defect involving the superior mesenteric vein and a branch extending into the right abdomen. There is concern that this filling defect is now extending into the main portal vein on sequence 4, image 40. In addition, there is concern for thrombus involving the right common femoral vein and possibly the right external iliac vein but difficult to differentiate thrombus from mixing of blood and contrast in these area. Reproductive: Status post hysterectomy. No adnexal masses. Other: A percutaneous drain has been placed at the midline and it is extending through the anterior abdominal wall fluid collection. This drain terminates in the midline of the abdomen near loops of bowel. The drain is positioned near surgical bowel clips. There is a low-density collection near the drain which measures up to 3.2 cm on sequence 2, image 53 and not clear if this is a bowel loop or extraluminal collection. There continues to be a large amount of fluid within the anterior abdominal wall which has minimally decreased since placement of the drain. The fluid collection on the sagittal images on sequence 5 image 62 measures 10.1 x 2.8 cm and previously measured 10.5 x 3.3 cm. There is a small amount of gas within the superior aspect of the collection. No new abscess  collections. Musculoskeletal: Bilateral pedicle screw and rod fixation at L4-L5. Interbody device at L4-L5. Again noted is severe disc space loss at L3-L4. No acute bone abnormality. IMPRESSION: 1. Anterior abdominal wall fluid collection has minimally decreased in size since placement of the percutaneous drain. Percutaneous drain extends through the collection and terminates in the midline of the abdomen near loops of bowel. Fluid collection near the tip of the catheter within the abdomen could represent a loop of bowel versus extraluminal fluid collection. This collection may be further evaluated with a drain injection. 2. Concern for propagation of clot in the superior mesenteric vein extending into the main portal vein. In addition, there are is concern for DVT in the right groin extending into the right external iliac vein. Consider further evaluation of the lower extremities with venous duplex imaging. 3. Persistent and slightly increased mesenteric edema. Mesenteric edema is likely related to the postsurgical changes, intra-abdominal / abdominal wall abscess and probably the mesenteric venous thrombus. 4. Liver has a nodular contour and suspicious for cirrhosis. These results were called by telephone at the time of interpretation on 02/02/2021 at 8:02 am to provider [facility], DO , who verbally acknowledged these results. Electronically Signed   By: Markus Daft M.D.   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 CT-guided drain placed on 01/27/2021. Recent CT imaging demonstrates persistent fluid  and gas collection within the anterior abdominal wall. The percutaneous drain is located intraperitoneal adjacent to the small bowel likely at the site of the fistula. Patient presents  for repositioning into the anterior abdominal collection. EXAM: Drain exchange MEDICATIONS: The patient is currently admitted to the hospital and receiving intravenous antibiotics. The antibiotics were administered within an appropriate time frame prior to the initiation of the procedure. ANESTHESIA/SEDATION: Fentanyl 100 mcg IV; Versed 3 mg IV Moderate Sedation Time:  12 minutes The patient was continuously monitored during the procedure by the interventional radiology nurse under my direct supervision. COMPLICATIONS: None immediate. PROCEDURE: Informed written consent was obtained from the patient after a thorough discussion of the procedural risks, benefits and alternatives. All questions were addressed. Maximal Sterile Barrier Technique was utilized including caps, mask, sterile gowns, sterile gloves, sterile drape, hand hygiene and skin antiseptic. A timeout was performed prior to the initiation of the procedure. Contrast was injected through the existing catheter. There is direct communication between the catheter in the adjacent small bowel. The catheter was slowly pulled back with additional injection performed. Contrast opacifies a large fluid collection within the anterior abdominal wall. The catheter was cut and removed over a Bentson wire. A 5 French angled catheter was advanced over the wire and used to direct the wire into the more dependent portion of the abdominal wall fluid collection. A new Cook 51 [place] all-purpose drainage catheter was then advanced over the wire and formed in the fluid collection. Aspiration yields approximately 60 mL of intestinal succus. The catheter was secured to the skin with 0 Prolene suture. IMPRESSION: 1. Contrast injection confirms enterocutaneous fistula. 2. Successful reposition and upsize of existing catheter to a new 33 French catheter which is positioned in the dependent aspect of the intra-abdominal fluid collection. Electronically Signed   By: Jacqulynn Cadet  M.D.   On: 02/02/2021 16:25   VAS [place] LOWER EXTREMITY VENOUS (DVT)  Result Date: 02/02/2021  Lower Venous DVT Study Indications: Edema.  Risk Factors: None identified. Limitations: Body habitus and poor ultrasound/tissue interface. Comparison Study: No prior studies. Performing Technologist: Oliver Hum RVT  Examination Guidelines: A complete evaluation includes B-mode imaging, spectral Doppler, color Doppler, and power Doppler as needed of all accessible portions of each vessel. Bilateral testing is considered an integral part of a complete examination. Limited examinations for reoccurring indications may be performed as noted. The reflux portion of the exam is performed with the patient in reverse Trendelenburg.  +---------+---------------+---------+-----------+----------+--------------+ RIGHT    CompressibilityPhasicitySpontaneityPropertiesThrombus Aging +---------+---------------+---------+-----------+----------+--------------+ CFV      None           No       No                   Acute          +---------+---------------+---------+-----------+----------+--------------+ SFJ      None                                         Acute          +---------+---------------+---------+-----------+----------+--------------+ FV Prox  Full                                                        +---------+---------------+---------+-----------+----------+--------------+  FV Mid   Full                                                        +---------+---------------+---------+-----------+----------+--------------+ FV DistalFull                                                        +---------+---------------+---------+-----------+----------+--------------+ PFV      Full                                                        +---------+---------------+---------+-----------+----------+--------------+ POP      Partial        Yes      Yes                  Acute           +---------+---------------+---------+-----------+----------+--------------+ PTV      Partial                                      Acute          +---------+---------------+---------+-----------+----------+--------------+ PERO     Partial                                      Acute          +---------+---------------+---------+-----------+----------+--------------+ Gastroc  Full                                                        +---------+---------------+---------+-----------+----------+--------------+ Thrombus located in the CFV is noted to only be in the distal segment.  +---------+---------------+---------+-----------+----------+--------------+ LEFT     CompressibilityPhasicitySpontaneityPropertiesThrombus Aging +---------+---------------+---------+-----------+----------+--------------+ CFV      Full           Yes      Yes                                 +---------+---------------+---------+-----------+----------+--------------+ SFJ      Full                                                        +---------+---------------+---------+-----------+----------+--------------+ FV Prox  Full                                                        +---------+---------------+---------+-----------+----------+--------------+  FV Mid   Full                                                        +---------+---------------+---------+-----------+----------+--------------+ FV DistalFull                                                        +---------+---------------+---------+-----------+----------+--------------+ PFV      Full                                                        +---------+---------------+---------+-----------+----------+--------------+ POP      Full           Yes      Yes                                 +---------+---------------+---------+-----------+----------+--------------+ PTV      Full                                                         +---------+---------------+---------+-----------+----------+--------------+ PERO     Full                                                        +---------+---------------+---------+-----------+----------+--------------+     Summary: RIGHT: - Findings consistent with acute deep vein thrombosis involving the right common femoral vein, right popliteal vein, right posterior tibial veins, and right peroneal veins. - No cystic structure found in the popliteal fossa.  LEFT: - There is no evidence of deep vein thrombosis in the lower extremity. However, portions of this examination were limited- see technologist comments above.  *See table(s) above for measurements and observations. Electronically signed by Deitra Mayo MD on 02/02/2021 at 1:50:40 PM.    Final    US Abdomen Limited RUQ (LIVER/GB)  Result Date: 02/03/2021 CLINICAL DATA:  60 year old female with cirrhosis. Prior cholecystectomy. History of appendiceal carcinoma. EXAM: ULTRASOUND ABDOMEN LIMITED RIGHT UPPER QUADRANT COMPARISON:  CT Abdomen and Pelvis 02/01/2021 and earlier. FINDINGS: Gallbladder: Surgically absent. Common bile duct: Diameter: 11 mm, versus 8-9 mm on the recent CT but 10-12 mm in January by CT. The CBD appears to taper distally (image 9). Liver: Nodular liver contour redemonstrated. No discrete liver lesion. No intrahepatic biliary ductal dilatation. Portal vein is patent on color Doppler imaging with normal direction of blood flow towards the liver. No evidence on these images that the SMV thrombus has occluded the main portal vein. Other: Negative visible right kidney. IMPRESSION: 1. Cirrhotic liver without discrete liver lesion by ultrasound. 2. Main portal vein remains patent with normal hepatopetal flow direction. 3.  CBD enlargement status post cholecystectomy appears not significantly changed and is probably physiologic. Electronically Signed   By: Genevie Ann M.D.   On: 02/03/2021 08:18     Labs:  CBC: Recent Labs    01/31/21 0607 02/01/21 0600 02/02/21 0515 02/03/21 0215  WBC 8.8 9.4 7.9 8.8  HGB 10.6* 10.4* 10.2* 10.9*  HCT 35.2* 33.1* 33.4* 34.7*  PLT 415* 394 251 385    COAGS: Recent Labs    02/02/21 0515  INR 1.2    BMP: Recent Labs    03/19/20 1109 05/01/20 1156 07/27/20 0922 10/28/20 0956 01/31/21 0607 02/01/21 0600 02/02/21 0515 02/03/21 0215  NA 143 141 143   < > 137 138 137 139  K 3.6 3.7 4.1   < > 3.8 3.6 3.5 3.4*  CL 105 104 103   < > 103 107 107 107  CO2 30 27 33*   < > 25 25 23 24   GLUCOSE 133* 102* 160*   < > 118* 117* 107* 116*  BUN 16 21* 19   < > 11 10 6  5*  CALCIUM 10.2 10.5* 10.4*   < > 8.7* 8.4* 8.2* 8.4*  CREATININE 0.78 1.05* 0.83   < > 0.64 0.59 0.45 0.61  GFRNONAA >60 59* >60   < > >60 >60 >60 >60  GFRAA >60 >60 >60  --   --   --   --   --    < > = values in this interval not displayed.    LIVER FUNCTION TESTS: Recent Labs    01/31/21 0607 02/01/21 0600 02/02/21 0515 02/03/21 0215  BILITOT 0.7 0.6 0.7 0.6  AST 35 26 30 31   ALT 14 13 13 14   ALKPHOS 269* 277* 253* 269*  PROT 6.7 6.4* 6.4* 6.6  ALBUMIN 2.4* 2.3* 2.3* 2.4*    Assessment and Plan: Patient with history of metastatic appendiceal cancer, pseudomyxoma peritonei and peritoneal carcinomatosis, status post CRS/HI PEC and splenectomy in 2005, recurrent cancer status post HI PEC and debulking on 12/10/2020 in [place];complicated by enterotomy requiring return to the OR on postop day 4 and then again for closure of open midline wound;now with postop abdominal wall fluid collection and deep pelvic collection on CT;hx c diff; status post drainage of midline abdominal fluid collection on 4/6; s/p repositioning and upsizing of abdominal drain to 16 [place] along with drain injection confirming enterocutaneous fistula on 4/12; currently on IV heparin for acute right lower extremity DVT; afebrile, WBC normal, hemoglobin stable, creatinine normal;  limited abdominal ultrasound today revealed cirrhotic liver without lesion, patent main portal vein with normal hepatopetal flow direction, some common bile duct enlargement status post cholecystectomy but not significantly changed and probably physiologic; continue current treatment, drain irrigation, output monitoring, lab checks.  Patient presumably to follow-up with general surgeons in [facility] for further evaluation of EC fistula.   Electronically Signed: D. Rowe Robert, PA-C 02/03/2021, 1:15 PM   I spent a total of 15 minutes at the the patient's bedside AND on the patient's hospital floor or unit, greater than 50% of which was counseling/coordinating care for abdominal abscess drain    Patient ID: Wendy Barber, female   DOB: [birth year], 60 y.o.   MRN: [id number]

## 2021-02-03 NOTE — Progress Notes (Signed)
[place] for heparin Indication: VTE  Allergies  Allergen Reactions  . Penicillins Shortness Of Breath    Other reaction(s): Irregular Heart Rate, Other (See Comments) Rapid heartrate   . Alprazolam Hives and Other (See Comments)    Note: tolerates midazolam fine unresponsive Hard to arouse unresponsive Hard to arouse  Other reaction(s): Other (See Comments) unresponsive  . Ativan [Lorazepam] Other (See Comments)    Note: tolerates midazolam fine Face & Throat Swelling.  . Corticosteroids Other (See Comments)    Other reaction(s): Other (see comments) Psychotic behaviour    . Erythromycin     Other reaction(s): Other (See Comments) Severe stomach pain   . Erythromycin Base Hives  . Milnacipran Hcl Other (See Comments)    Other reaction(s):  psychotic episode     . Prednisone Other (See Comments)    Anxiety & Nervous Breakdown.  Ocie Cornfield  [Milnacipran]   . Povidone Iodine Rash    Had rash under bandage after surgery , has had betadine since without reaction   . Prednisolone Anxiety    Patient Measurements: Height: 5\' 6"  (167.6 cm) Weight: 102.1 kg (225 lb) IBW/kg (Calculated) : 59.3 Heparin Dosing Weight: 82 kg  Vital Signs: Temp: 99.6 F (37.6 C) (04/12 2020) Temp Source: Oral (04/12 2020) BP: 141/83 (04/12 2020) Pulse Rate: 74 (04/12 2200)  Labs: Recent Labs    02/01/21 0600 02/02/21 0515 02/02/21 1714 02/03/21 0215  HGB 10.4* 10.2*  --  10.9*  HCT 33.1* 33.4*  --  34.7*  PLT 394 251  --  385  LABPROT  --  14.4  --   --   INR  --  1.2  --   --   HEPARINUNFRC  --   --  <0.10* 0.76*  CREATININE 0.59 0.45  --  0.61    Estimated Creatinine Clearance: 92.4 mL/min (by C-G formula based on SCr of 0.61 mg/dL).   Medications:  - on lovenox 40mg  daily PTA  Assessment: Pt's a 60 y.o. F with complicated medical hx that includes metastatic appendiceal cancer, autoimmune hepatitis, CRS/HIPEC for recurrent  cancer, and PE/DVT (s/p IVC filter) presented to the ED on 3/26 with c/o n/v/d.  During this admission, she was treated for cdiff and is currently on abx for abdominal surgical wound site infection.  Abdominal CT on 4/11 showed findings with "concern for propagation of clot in the superior mesenteric vein extending into the main portal vein" and "for DVT in the right groin extending into the right external iliac vein."  Pharmacy has been consulted to start heparin drip for VTE.   Significant Events: 4/12: Drain replacement in IR.  Per Rowe Robert, PA- ok to start heparin & do not need to stop for procedure.    02/03/2021  Heparin level 0.76- now supra-therapeutic on IV Heparin 1700 units/hr  CBC: Hg slightly low/stable at 10.9, pltc WNL  No interruptions to IV infusion per RN.  Small bloody drainage noted in percutaneous tube placed yesterday- amount is unchanged per RN.   Goal of Therapy:  Heparin level 0.3-0.7 units/ml Monitor platelets by anticoagulation protocol: Yes   Plan:  - Decrease IV heparin drip to 1700 units/hr - Recheck 6 hr heparin level - monitor for s/sx bleeding -daily CBC & heparin level while on heparin  Netta Cedars, Pharm.D 02/03/2021 2:58 AM

## 2021-02-03 NOTE — Progress Notes (Signed)
Subjective/Chief Complaint: Patient reports a slight improvement in the abdominal pressure s/p IR drain repositioning/upsize. She has ongoing abd discomfort and had diarrhea yesterday evening. She denies emesis. Has not mobilized much this admission due to either procedures or dizziness with mobilization.   She states her husband did call her surgeon from [place] yesterday but she has not had a chance to clarify exactly what was said/planned.  Objective: Vital signs in last 24 hours: Temp:  [98 F (36.7 C)-99.6 F (37.6 C)] 98 F (36.7 C) (04/13 0400) Pulse Rate:  [71-81] 71 (04/13 0400) Resp:  [14-28] 14 (04/12 2020) BP: (124-144)/(73-88) 135/88 (04/13 0400) SpO2:  [93 %-99 %] 93 % (04/13 0400) Last BM Date: 02/02/21  Intake/Output from previous day: 04/12 0701 - 04/13 0700 In: -  Out: 440 [Urine:400; Drains:40] Intake/Output this shift: No intake/output data recorded.  Gen: alert, non-toxic, NAD Abd: soft, obese, laparotomy incision ok, IR drain in place, bag recently emptied, nothing in gravity bag (40 cc documented since placement yesterday late afternoon)  Lab Results:  Recent Labs    02/02/21 0515 02/03/21 0215  WBC 7.9 8.8  HGB 10.2* 10.9*  HCT 33.4* 34.7*  PLT 251 385   BMET Recent Labs    02/02/21 0515 02/03/21 0215  NA 137 139  K 3.5 3.4*  CL 107 107  CO2 23 24  GLUCOSE 107* 116*  BUN 6 5*  CREATININE 0.45 0.61  CALCIUM 8.2* 8.4*   PT/INR Recent Labs    02/02/21 0515  LABPROT 14.4  INR 1.2   ABG No results for input(s): PHART, HCO3 in the last 72 hours.  Invalid input(s): PCO2, PO2  Studies/Results: CT ABDOMEN PELVIS W CONTRAST  Result Date: 02/02/2021 CLINICAL DATA:  Follow-up abdominal abscess. Status post drain placement on 01/27/2021. History of appendiceal cancer. EXAM: CT ABDOMEN AND PELVIS WITH CONTRAST TECHNIQUE: Multidetector CT imaging of the abdomen and pelvis was performed using the standard protocol following bolus  administration of intravenous contrast. CONTRAST:  160mL OMNIPAQUE IOHEXOL 300 MG/ML  SOLN COMPARISON:  CT abdomen 01/26/2021 FINDINGS: Lower chest: No pleural effusions. Scattered pleural-based densities are suggestive for atelectasis or mild scarring. No significant airspace disease or consolidation at the lung bases. Hepatobiliary: Liver has a nodular contour and similar to the previous examination. Findings are suspicious for cirrhosis. The gallbladder has been removed. Intrahepatic portal venous system is patent. No focal liver lesion. Pancreas: Unremarkable. No pancreatic ductal dilatation or surrounding inflammatory changes. Common bile duct measures 8 mm and stable. Spleen: Surgically removed. Adrenals/Urinary Tract: Normal appearance of the adrenal glands. Normal appearance of both kidneys without hydronephrosis. Mild fullness of the renal pelvis bilaterally. Normal appearance of the urinary bladder. Stomach/Bowel: Extensive postsurgical bowel changes with bowel anastomotic clips involving the rectosigmoid region and multiple bowel surgical clips in the right lower quadrant of the abdomen. Multiple loops of bowel are in close proximity along the anterior abdominal wall associated with the abdominal wall fluid collection and percutaneous drain. No significant bowel dilatation and no evidence for bowel obstruction. Diffuse mesenteric edema without focal bowel inflammation. Vascular/Lymphatic: Minimal atherosclerotic disease in the abdominal aorta without aneurysm. Main visceral arteries are patent. Again noted is an IVC filter in the infrarenal IVC with filter legs penetrating through the IVC. Again noted is a filling defect involving the superior mesenteric vein and a branch extending into the right abdomen. There is concern that this filling defect is now extending into the main portal vein on sequence 4, image 40. In  addition, there is concern for thrombus involving the right common femoral vein and  possibly the right external iliac vein but difficult to differentiate thrombus from mixing of blood and contrast in these area. Reproductive: Status post hysterectomy. No adnexal masses. Other: A percutaneous drain has been placed at the midline and it is extending through the anterior abdominal wall fluid collection. This drain terminates in the midline of the abdomen near loops of bowel. The drain is positioned near surgical bowel clips. There is a low-density collection near the drain which measures up to 3.2 cm on sequence 2, image 53 and not clear if this is a bowel loop or extraluminal collection. There continues to be a large amount of fluid within the anterior abdominal wall which has minimally decreased since placement of the drain. The fluid collection on the sagittal images on sequence 5 image 62 measures 10.1 x 2.8 cm and previously measured 10.5 x 3.3 cm. There is a small amount of gas within the superior aspect of the collection. No new abscess collections. Musculoskeletal: Bilateral pedicle screw and rod fixation at L4-L5. Interbody device at L4-L5. Again noted is severe disc space loss at L3-L4. No acute bone abnormality. IMPRESSION: 1. Anterior abdominal wall fluid collection has minimally decreased in size since placement of the percutaneous drain. Percutaneous drain extends through the collection and terminates in the midline of the abdomen near loops of bowel. Fluid collection near the tip of the catheter within the abdomen could represent a loop of bowel versus extraluminal fluid collection. This collection may be further evaluated with a drain injection. 2. Concern for propagation of clot in the superior mesenteric vein extending into the main portal vein. In addition, there are is concern for DVT in the right groin extending into the right external iliac vein. Consider further evaluation of the lower extremities with venous duplex imaging. 3. Persistent and slightly increased mesenteric edema.  Mesenteric edema is likely related to the postsurgical changes, intra-abdominal / abdominal wall abscess and probably the mesenteric venous thrombus. 4. Liver has a nodular contour and suspicious for cirrhosis. These results were called by telephone at the time of interpretation on 02/02/2021 at 8:02 am to provider [facility], DO , who verbally acknowledged these results. Electronically Signed   By: Markus Daft M.D.   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 CT-guided drain placed on 01/27/2021. Recent CT imaging demonstrates persistent fluid and gas collection within the anterior abdominal wall. The percutaneous drain is located intraperitoneal adjacent to the small bowel likely at the site of the fistula. Patient presents for repositioning into the anterior abdominal collection. EXAM: Drain exchange MEDICATIONS: The patient is currently admitted to the hospital and receiving intravenous antibiotics. The antibiotics were administered within an appropriate time frame prior to the initiation of the procedure. ANESTHESIA/SEDATION: Fentanyl 100 mcg IV; Versed 3 mg IV Moderate Sedation Time:  12 minutes The patient was continuously monitored during the procedure by the interventional radiology nurse under my direct supervision. COMPLICATIONS: None immediate. PROCEDURE: Informed written consent was obtained from the patient after a thorough discussion of the procedural risks, benefits and alternatives. All questions were addressed. Maximal Sterile Barrier Technique was utilized including caps, mask, sterile gowns, sterile gloves, sterile drape, hand hygiene and skin antiseptic. A timeout was performed prior to the initiation of the  procedure. Contrast was injected through the existing  catheter. There is direct communication between the catheter in the adjacent small bowel. The catheter was slowly pulled back with additional injection performed. Contrast opacifies a large fluid collection within the anterior abdominal wall. The catheter was cut and removed over a Bentson wire. A 5 French angled catheter was advanced over the wire and used to direct the wire into the more dependent portion of the abdominal wall fluid collection. A new Cook 57 [place] all-purpose drainage catheter was then advanced over the wire and formed in the fluid collection. Aspiration yields approximately 60 mL of intestinal succus. The catheter was secured to the skin with 0 Prolene suture. IMPRESSION: 1. Contrast injection confirms enterocutaneous fistula. 2. Successful reposition and upsize of existing catheter to a new 52 French catheter which is positioned in the dependent aspect of the intra-abdominal fluid collection. Electronically Signed   By: Jacqulynn Cadet M.D.   On: 02/02/2021 16:25   VAS [place] LOWER EXTREMITY VENOUS (DVT)  Result Date: 02/02/2021  Lower Venous DVT Study Indications: Edema.  Risk Factors: None identified. Limitations: Body habitus and poor ultrasound/tissue interface. Comparison Study: No prior studies. Performing Technologist: Oliver Hum RVT  Examination Guidelines: A complete evaluation includes B-mode imaging, spectral Doppler, color Doppler, and power Doppler as needed of all accessible portions of each vessel. Bilateral testing is considered an integral part of a complete examination. Limited examinations for reoccurring indications may be performed as noted. The reflux portion of the exam is performed with the patient in reverse Trendelenburg.  +---------+---------------+---------+-----------+----------+--------------+ RIGHT    CompressibilityPhasicitySpontaneityPropertiesThrombus Aging +---------+---------------+---------+-----------+----------+--------------+ CFV      None            No       No                   Acute          +---------+---------------+---------+-----------+----------+--------------+ SFJ      None                                         Acute          +---------+---------------+---------+-----------+----------+--------------+ FV Prox  Full                                                        +---------+---------------+---------+-----------+----------+--------------+ FV Mid   Full                                                        +---------+---------------+---------+-----------+----------+--------------+ FV DistalFull                                                        +---------+---------------+---------+-----------+----------+--------------+ PFV      Full                                                        +---------+---------------+---------+-----------+----------+--------------+  POP      Partial        Yes      Yes                  Acute          +---------+---------------+---------+-----------+----------+--------------+ PTV      Partial                                      Acute          +---------+---------------+---------+-----------+----------+--------------+ PERO     Partial                                      Acute          +---------+---------------+---------+-----------+----------+--------------+ Gastroc  Full                                                        +---------+---------------+---------+-----------+----------+--------------+ Thrombus located in the CFV is noted to only be in the distal segment.  +---------+---------------+---------+-----------+----------+--------------+ LEFT     CompressibilityPhasicitySpontaneityPropertiesThrombus Aging +---------+---------------+---------+-----------+----------+--------------+ CFV      Full           Yes      Yes                                  +---------+---------------+---------+-----------+----------+--------------+ SFJ      Full                                                        +---------+---------------+---------+-----------+----------+--------------+ FV Prox  Full                                                        +---------+---------------+---------+-----------+----------+--------------+ FV Mid   Full                                                        +---------+---------------+---------+-----------+----------+--------------+ FV DistalFull                                                        +---------+---------------+---------+-----------+----------+--------------+ PFV      Full                                                        +---------+---------------+---------+-----------+----------+--------------+  POP      Full           Yes      Yes                                 +---------+---------------+---------+-----------+----------+--------------+ PTV      Full                                                        +---------+---------------+---------+-----------+----------+--------------+ PERO     Full                                                        +---------+---------------+---------+-----------+----------+--------------+     Summary: RIGHT: - Findings consistent with acute deep vein thrombosis involving the right common femoral vein, right popliteal vein, right posterior tibial veins, and right peroneal veins. - No cystic structure found in the popliteal fossa.  LEFT: - There is no evidence of deep vein thrombosis in the lower extremity. However, portions of this examination were limited- see technologist comments above.  *See table(s) above for measurements and observations. Electronically signed by Deitra Mayo MD on 02/02/2021 at 1:50:40 PM.    Final     Anti-infectives: Anti-infectives (From admission, onward)   Start     Dose/Rate Route Frequency  Ordered Stop   02/01/21 2200  metroNIDAZOLE (FLAGYL) IVPB 500 mg        500 mg 100 mL/hr over 60 Minutes Intravenous Every 12 hours 02/01/21 1804     01/30/21 1000  vancomycin (VANCOCIN) 50 mg/mL oral solution 125 mg        125 mg Oral Every 12 hours 01/29/21 1431     01/29/21 2200  metroNIDAZOLE (FLAGYL) tablet 500 mg  Status:  Discontinued        500 mg Oral Every 12 hours 01/29/21 1700 02/01/21 1804   01/29/21 1800  metroNIDAZOLE (FLAGYL) tablet 500 mg  Status:  Discontinued        500 mg Oral Every 8 hours 01/29/21 1431 01/29/21 1700   01/29/21 1530  cefdinir (OMNICEF) capsule 300 mg        300 mg Oral Every 12 hours 01/29/21 1431     01/28/21 1800  metroNIDAZOLE (FLAGYL) IVPB 500 mg  Status:  Discontinued        500 mg 100 mL/hr over 60 Minutes Intravenous Every 8 hours 01/28/21 1517 01/29/21 1431   01/28/21 1600  ciprofloxacin (CIPRO) IVPB 400 mg  Status:  Discontinued        400 mg 200 mL/hr over 60 Minutes Intravenous Every 12 hours 01/28/21 1517 01/29/21 1431   01/23/21 1315  vancomycin (VANCOCIN) 50 mg/mL oral solution 125 mg        125 mg Oral Every 6 hours 01/23/21 1226 01/29/21 2359   01/23/21 1000  fidaxomicin (DIFICID) tablet 200 mg  Status:  Discontinued        200 mg Oral 2 times daily 01/23/21 0859 01/23/21 1226   01/17/21 0600  vancomycin (VANCOREADY) IVPB 1500 mg/300 mL  Status:  Discontinued        1,500  mg 150 mL/hr over 120 Minutes Intravenous Every 24 hours 01/16/21 1732 01/17/21 1216   01/16/21 2200  metroNIDAZOLE (FLAGYL) IVPB 500 mg  Status:  Discontinued        500 mg 100 mL/hr over 60 Minutes Intravenous Every 8 hours 01/16/21 1622 01/17/21 1216   01/16/21 2000  vancomycin (VANCOCIN) 50 mg/mL oral solution 125 mg  Status:  Discontinued        125 mg Oral 4 times daily 01/16/21 1805 01/23/21 0859   01/16/21 1800  ceFEPIme (MAXIPIME) 2 g in sodium chloride 0.9 % 100 mL IVPB  Status:  Discontinued        2 g 200 mL/hr over 30 Minutes Intravenous Every 8 hours  01/16/21 1728 01/17/21 1216   01/16/21 1545  vancomycin (VANCOCIN) 50 mg/mL oral solution 125 mg  Status:  Discontinued        125 mg Oral 4 times daily 01/16/21 1539 01/16/21 1622   01/16/21 1445  metroNIDAZOLE (FLAGYL) IVPB 500 mg        500 mg 100 mL/hr over 60 Minutes Intravenous  Once 01/16/21 1438 01/16/21 1622   01/16/21 1330  vancomycin (VANCOCIN) IVPB 1000 mg/200 mL premix        1,000 mg 200 mL/hr over 60 Minutes Intravenous  Once 01/16/21 1320 01/16/21 1538      Assessment/Plan: C diff colitis -on PO vancomycin, diarrhea improving  PMH DVT and IVC filter placement, now with acute DVT (SMV, PVT, Iliac thrombus) - hep gt   Metastatic appendiceal cancer, pseudomyxoma peritonei and peritoneal carcinomatosis -S/PCRS/HIPECand splenectomy in 2005 -Recurrent cancer S/PHIPECand debulkingon 12/10/2020 in [place];complicated by enterotomy requiring return to the OR on postop day 4 and then again for closure of open midline wound -Postop abdominal wall fluid collection and deep pelvic collectionon CT - EC fistula  - repeat CT scan 4/5 showed slight interval increase in size of the anterior abdominal wall abscess which appears to communicate with a smaller intra-abdominal abscess; findings worrisome for a fistula; persistent fluid collection in the pelvis just above the bladder anteriorly, this could be a liquified hematoma or postoperative seroma - s/p IR drain placement 4/6 in abdominal fluid collection, output is now enteric appearing >> cx :FEW KLEBSIELLA PNEUMONIAE -s/p IR drain injection study, drain repositioning, and upsize. Confirmed EC fistula 02/02/21 - on abx per ID - No role for urgent/emergent surgery at this time. Vitals stable, Wbc normal. EC fistula controlled with IR drain. At this point in the time the best thing for the patient is for her to follow up her surgeons in [place] in the next 2-4 weeks. She will likely need further imaging to localize the fistula  and her surgeons will need to monitor the fistula output, make dietary recommendations, and follow for potential future surgery. [facility] will sign off but will remain available as needed.  ID -PO vancomycin, cipro/flagyl 4/7>> FEN -currently on HH diet, if fistula becomes high output patient will need to be made NPO.  VTE - hep gtt  Foley -none Dispo- follow up [place] 02/03/2021

## 2021-02-03 NOTE — Progress Notes (Signed)
PROGRESS NOTE  Wendy Barber  DOB: [birth year]  PCP: Glendon Axe, MD WUJ:[id number]  DOA: 01/16/2021  LOS: 18 days   Chief Complaint  Patient presents with  . Diarrhea  . Wound Check    Brief narrative: Wendy Barber is a 60 y.o. female with PMH significant for metastatic appendiceal cancer, pseudomyxoma peritonei and peritoneal carcinomatosis status post CRS/HIPEC (cytoreductive surgery/hyperthermic intraperitoneal chemotherapy) and splenectomy in 2005 in [place] and recent CRS/HIPEC on 12/10/2020 for recurrent cancer.  Also with history of autoimmune hepatitis, liver cirrhosis, DVT and PE with IVC filter chronically on Lovenox, hypertension, diabetes mellitus type 2. Since her last hospitalization in February 2022, she has had persistent diarrhea but has negative for C. difficile started on Imodium and Lomotil with some improvement but recently seen by her oncologist in [place] on 3/16.  She states that she was started on antibiotics at that time. Since the antibiotics, diarrhea is worsened and associated with more abdominal pain, fatigue and loss of appetite.   At home, she also noted purulent foul-smelling drainage from surgical incision site.  Patient presented to the ED on 01/16/2021 with multiple complaints including intractable diarrhea since her recent procedure, persistent wound drainage and worsening fatigue.   In the ED, she tested C. difficile positive. CT abdomen pelvis showed gas and fluid collection within the anterior abdominal wall, ventral surgical wound measuring 3.2 x 1.8 x 13 cm concerning for wound infection.  There was also a loculated fluid collection in the pelvis just superior and anterior to the urinary bladder measuring 5.5 x 2.6 x 3.8 cm, question sterile versus infected postoperative collection.  Patient was admitted to hospitalist service. General surgery, IR, medical oncology consultation were obtained.   See below for details.  Subjective: Patient was  seen and examined this morning.  Pleasant middle-aged Caucasian female frustrated with prolonged course of hospital stay.  Assessment/Plan: Postsurgical abdominal wound infection Abdominal wall abscess s/p drain placement by IR on 01/27/2021 Enterocutaneous fistula - 4/12 -Patient presented with concerns for abdominal postsurgical wound infection. -4/6, IR placed a 10 French drain in her abscess and yielded 65 cc of amber-colored fluid. Her drain was connected to gravity bag.   -4/12 repeat CT scan of abdomen suggested the presence of enterocutaneous fistula.  Per general surgery and IR, EC fistula is related to previous surgery done in [place] in February.  Recommendation is to continue the current drain and follow-up with general surgeons at [facility] for further evaluation of enterocutaneous fistula -Culture from the drain grew Klebsiella pneumoniae.  -Per ID recommendation, patient is on cefdinir 300 mg p.o. twice daily and Flagyl 5 mg p.o. twice daily for 6 weeks until 5/20.  To repeat abdominal and pelvic CAT scan in 2 to 3 weeks and follow-up with ID as an outpatient.  C. difficile colitis -Diarrhea improving.  Per ID recommendation, currently on prolonged course of oral vancomycin because of use of IV antibiotics.  Recommended to continue oral vancomycin for the duration of antibiotics +1 more week.   -Continue probiotics, low-dose Lomotil. -4/2, A dose of Dificid was given  Intractable abdominal pain -Due to colitis, intra-abdominal abscesses, EC fistula, intra-abdominal cancer -Currently being controlled with fentanyl patch, IV Dilaudid, oral Dilaudid. -Wean down opioids as tolerated.  History of metastatic appendiceal cancer, pseudomyxoma peritonei and peritoneal carcinomatosis status post CRS/HI PEC and splenectomy in 2005 and recently CRS/HI PEC on 12/10/2020 for recurrent cancer -Medical Oncology consulted and appreciate further evaluation and Recommendations -Will need to follow  up at D/C  with Surgeon in [place] or Specialist at [facility]   Acute DVT of right common femoral vein, right popliteal vein, right posterior tibial vein and right peroneal veins, supra mesenteric vein, portal vein and iliac vein - 4/12 History of DVT and PE with IVC filter  -Was on prophylactic dose of Lovenox at home.  Currently on heparin drip  History of autoimmune Hepatitis, liver cirrhosis -Currently azathioprine is on hold and could likely resume once antibiotic course has been completed with clinical improvement.  -LFTs currently normal.  EssentialHypertension -Continue Amlodipine 10 mg po Daily and Propranolol 20 mg po BID  -Continue to Monitor BP per Protocol   Essential Tremor -Continue Propranolol 20 mg po BID  Anxiety -C/w Buspirone 5 mg p.o. daily with breakfast, 5 mg p.o. daily, and then 10 mg p.o. nightly. -May need Psych to see given Depression due to situation  Type 2 Diabetes Mellitus  -A1c 5.8 on 3/26. -Diet controlled.  Hypokalemia -Replacement ordered. Recent Labs  Lab 01/30/21 0531 01/31/21 0607 02/01/21 0600 02/02/21 0515 02/03/21 0215  K 4.3 3.8 3.6 3.5 3.4*  MG 2.0 1.9 1.8 1.9 1.8  PHOS 3.7 4.0 3.6 3.3 3.3   Chronic normocytic anemia -Hemoglobin stable between 10-11.    Mobility: Encourage ambulation.  PT eval obtained. No f/u recommended. Code Status:   Code Status: Full Code  Nutritional status: Body mass index is 36.32 kg/m. Nutrition Problem: Increased nutrient needs Etiology: cancer and cancer related treatments Signs/Symptoms: estimated needs Diet Order            Diet Heart Room service appropriate? Yes; Fluid consistency: Thin  Diet effective now                 DVT prophylaxis: Place TED hose Start: 01/31/21 1856   Antimicrobials:  Metronidazole IV, cefdinir oral, vancomycin oral Fluid: not on abx Consultants: Oncology, ID, surgery, IR Family Communication:  None at bedside  Status is: Inpatient  Remains  inpatient appropriate because: On IV heparin, on EC fistula   Dispo: The patient is from: Home              Anticipated d/c is to: Home              Patient currently is not medically stable to d/c.   Difficult to place patient No       Infusions:  . heparin 1,600 Units/hr (02/03/21 1631)  . metronidazole 500 mg (02/03/21 1100)    Scheduled Meds: . amLODipine  10 mg Oral Daily  . busPIRone  5 mg Oral Q breakfast   And  . busPIRone  5 mg Oral Q1200   And  . busPIRone  10 mg Oral QHS  . cefdinir  300 mg Oral Q12H  . famotidine  40 mg Oral Daily  . fentaNYL  1 patch Transdermal Q72H  . Gerhardt's butt cream   Topical QID  . multivitamin with minerals  1 tablet Oral Daily  . polycarbophil  625 mg Oral Daily  . propranolol  20 mg Oral BID  . protein supplement  1 Scoop Oral TID WC  . saccharomyces boulardii  250 mg Oral BID  . scopolamine  1 patch Transdermal Q72H  . sodium chloride flush  3 mL Intravenous Q12H  . sodium chloride flush  5 mL Intracatheter Q8H  . vancomycin  125 mg Oral Q12H    Antimicrobials: Anti-infectives (From admission, onward)   Start     Dose/Rate Route Frequency Ordered Stop   02/01/21  2200  metroNIDAZOLE (FLAGYL) IVPB 500 mg        500 mg 100 mL/hr over 60 Minutes Intravenous Every 12 hours 02/01/21 1804     01/30/21 1000  vancomycin (VANCOCIN) 50 mg/mL oral solution 125 mg        125 mg Oral Every 12 hours 01/29/21 1431     01/29/21 2200  metroNIDAZOLE (FLAGYL) tablet 500 mg  Status:  Discontinued        500 mg Oral Every 12 hours 01/29/21 1700 02/01/21 1804   01/29/21 1800  metroNIDAZOLE (FLAGYL) tablet 500 mg  Status:  Discontinued        500 mg Oral Every 8 hours 01/29/21 1431 01/29/21 1700   01/29/21 1530  cefdinir (OMNICEF) capsule 300 mg        300 mg Oral Every 12 hours 01/29/21 1431     01/28/21 1800  metroNIDAZOLE (FLAGYL) IVPB 500 mg  Status:  Discontinued        500 mg 100 mL/hr over 60 Minutes Intravenous Every 8 hours 01/28/21  1517 01/29/21 1431   01/28/21 1600  ciprofloxacin (CIPRO) IVPB 400 mg  Status:  Discontinued        400 mg 200 mL/hr over 60 Minutes Intravenous Every 12 hours 01/28/21 1517 01/29/21 1431   01/23/21 1315  vancomycin (VANCOCIN) 50 mg/mL oral solution 125 mg        125 mg Oral Every 6 hours 01/23/21 1226 01/29/21 2359   01/23/21 1000  fidaxomicin (DIFICID) tablet 200 mg  Status:  Discontinued        200 mg Oral 2 times daily 01/23/21 0859 01/23/21 1226   01/17/21 0600  vancomycin (VANCOREADY) IVPB 1500 mg/300 mL  Status:  Discontinued        1,500 mg 150 mL/hr over 120 Minutes Intravenous Every 24 hours 01/16/21 1732 01/17/21 1216   01/16/21 2200  metroNIDAZOLE (FLAGYL) IVPB 500 mg  Status:  Discontinued        500 mg 100 mL/hr over 60 Minutes Intravenous Every 8 hours 01/16/21 1622 01/17/21 1216   01/16/21 2000  vancomycin (VANCOCIN) 50 mg/mL oral solution 125 mg  Status:  Discontinued        125 mg Oral 4 times daily 01/16/21 1805 01/23/21 0859   01/16/21 1800  ceFEPIme (MAXIPIME) 2 g in sodium chloride 0.9 % 100 mL IVPB  Status:  Discontinued        2 g 200 mL/hr over 30 Minutes Intravenous Every 8 hours 01/16/21 1728 01/17/21 1216   01/16/21 1545  vancomycin (VANCOCIN) 50 mg/mL oral solution 125 mg  Status:  Discontinued        125 mg Oral 4 times daily 01/16/21 1539 01/16/21 1622   01/16/21 1445  metroNIDAZOLE (FLAGYL) IVPB 500 mg        500 mg 100 mL/hr over 60 Minutes Intravenous  Once 01/16/21 1438 01/16/21 1622   01/16/21 1330  vancomycin (VANCOCIN) IVPB 1000 mg/200 mL premix        1,000 mg 200 mL/hr over 60 Minutes Intravenous  Once 01/16/21 1320 01/16/21 1538      PRN meds: acetaminophen, calcium carbonate, diclofenac Sodium, diphenoxylate-atropine, hydrALAZINE, HYDROmorphone (DILAUDID) injection, HYDROmorphone, ondansetron **OR** ondansetron (ZOFRAN) IV, simethicone   Objective: Vitals:   02/03/21 1014 02/03/21 1527  BP: (!) 154/91 (!) 150/87  Pulse: 71 72  Resp:  18   Temp:  98.8 F (37.1 C)  SpO2: 94% 94%    Intake/Output Summary (Last 24 hours) at 02/03/2021  Kennan filed at 02/03/2021 1631 Gross per 24 hour  Intake 590 ml  Output 440 ml  Net 150 ml   Filed Weights   01/16/21 1700  Weight: 102.1 kg   Weight change:  Body mass index is 36.32 kg/m.   Physical Exam: General exam: Pleasant middle-aged Caucasian female.  Frustrated.  Not in pain right now Skin: No rashes, lesions or ulcers. HEENT: Atraumatic, normocephalic, no obvious bleeding Lungs: Clear to auscultation bilaterally CVS: Regular rate and rhythm, no murmur GI/Abd soft, mild to moderate diffuse tenderness, has intra-abdominal drain in place CNS: Alert, awake, oriented x3 Psychiatry: Mood appropriate Extremities: No pedal edema, no calf tenderness  Data Review: I have personally reviewed the laboratory data and studies available.  Recent Labs  Lab 01/30/21 0531 01/31/21 0607 02/01/21 0600 02/02/21 0515 02/03/21 0215  WBC 7.8 8.8 9.4 7.9 8.8  NEUTROABS 2.2 2.9 2.9 2.6 3.2  HGB 10.7* 10.6* 10.4* 10.2* 10.9*  HCT 34.7* 35.2* 33.1* 33.4* 34.7*  MCV 99.4 101.4* 100.0 100.0 99.1  PLT 417* 415* 394 251 385   Recent Labs  Lab 01/30/21 0531 01/31/21 0607 02/01/21 0600 02/02/21 0515 02/03/21 0215  NA 141 137 138 137 139  K 4.3 3.8 3.6 3.5 3.4*  CL 104 103 107 107 107  CO2 28 25 25 23 24   GLUCOSE 116* 118* 117* 107* 116*  BUN 9 11 10 6  5*  CREATININE 0.53 0.64 0.59 0.45 0.61  CALCIUM 9.0 8.7* 8.4* 8.2* 8.4*  MG 2.0 1.9 1.8 1.9 1.8  PHOS 3.7 4.0 3.6 3.3 3.3    F/u labs ordered Unresulted Labs (From admission, onward)          Start     Ordered   02/04/21 5320  Basic metabolic panel  Daily,   R     Question:  Specimen collection method  Answer:  Lab=Lab collect   02/03/21 1745   02/03/21 0500  CBC  Daily,   R     Question:  Specimen collection method  Answer:  Lab=Lab collect   02/02/21 0844   01/16/21 1201  CBC with Differential/Platelet  Once,    R        01/16/21 1201          Signed, Terrilee Croak, MD [facility] 02/03/2021

## 2021-02-03 NOTE — Progress Notes (Signed)
[place] for heparin Indication: acute VTE  Allergies  Allergen Reactions  . Penicillins Shortness Of Breath    Other reaction(s): Irregular Heart Rate, Other (See Comments) Rapid heartrate   . Alprazolam Hives and Other (See Comments)    Note: tolerates midazolam fine unresponsive Hard to arouse unresponsive Hard to arouse  Other reaction(s): Other (See Comments) unresponsive  . Ativan [Lorazepam] Other (See Comments)    Note: tolerates midazolam fine Face & Throat Swelling.  . Corticosteroids Other (See Comments)    Other reaction(s): Other (see comments) Psychotic behaviour    . Erythromycin     Other reaction(s): Other (See Comments) Severe stomach pain   . Erythromycin Base Hives  . Milnacipran Hcl Other (See Comments)    Other reaction(s):  psychotic episode     . Prednisone Other (See Comments)    Anxiety & Nervous Breakdown.  Wendy Barber  [Milnacipran]   . Povidone Iodine Rash    Had rash under bandage after surgery , has had betadine since without reaction   . Prednisolone Anxiety    Patient Measurements: Height: 5\' 6"  (167.6 cm) Weight: 102.1 kg (225 lb) IBW/kg (Calculated) : 59.3 Heparin Dosing Weight: 82 kg  Vital Signs: Temp: 98 F (36.7 C) (04/13 0400) Temp Source: Oral (04/13 0400) BP: 135/88 (04/13 0400) Pulse Rate: 71 (04/13 0400)  Labs: Recent Labs    02/01/21 0600 02/02/21 0515 02/02/21 1714 02/03/21 0215  HGB 10.4* 10.2*  --  10.9*  HCT 33.1* 33.4*  --  34.7*  PLT 394 251  --  385  LABPROT  --  14.4  --   --   INR  --  1.2  --   --   HEPARINUNFRC  --   --  <0.10* 0.76*  CREATININE 0.59 0.45  --  0.61    Estimated Creatinine Clearance: 92.4 mL/min (by C-G formula based on SCr of 0.61 mg/dL).   Medications:  - on lovenox 40mg  daily PTA  Assessment: Pt's a 60 y.o. F with complicated medical hx that includes metastatic appendiceal cancer, autoimmune hepatitis, CRS/HIPEC for  recurrent cancer, and PE/DVT (s/p IVC filter) presented to the ED on 3/26 with c/o n/v/d.  During this admission, she was treated for cdiff and is currently on abx for abdominal surgical wound site infection.  Abdominal CT on 4/11 showed findings with "concern for propagation of clot in the superior mesenteric vein extending into the main portal vein" and "for DVT in the right groin extending into the right external iliac vein." LE doppler on 4/12 showed extensive RLE DVT.  She's currently on heparin drip for VTE.   Significant Events: 4/12: Drain replacement in IR.  Per Rowe Robert, PA- ok to start heparin & do not need to stop for procedure.   Today, 02/03/2021: - heparin level is therapeutic at 0.62 with drip infusing at 1600 units/hr - cbc stable - no bleeding noted    Goal of Therapy:  Heparin level 0.3-0.7 units/ml Monitor platelets by anticoagulation protocol: Yes   Plan:  - continue heparin drip 1600 units/hr - Recheck 6 hr heparin level to confirm level is still therapeutic before changing to daily monitoring - monitor for s/sx bleeding -daily CBC & heparin level while on heparin  Dia Sitter, PharmD, BCPS 02/03/2021 8:39 AM

## 2021-02-03 NOTE — Progress Notes (Signed)
[place] for heparin Indication: acute VTE  Allergies  Allergen Reactions  . Penicillins Shortness Of Breath    Other reaction(s): Irregular Heart Rate, Other (See Comments) Rapid heartrate   . Alprazolam Hives and Other (See Comments)    Note: tolerates midazolam fine unresponsive Hard to arouse unresponsive Hard to arouse  Other reaction(s): Other (See Comments) unresponsive  . Ativan [Lorazepam] Other (See Comments)    Note: tolerates midazolam fine Face & Throat Swelling.  . Corticosteroids Other (See Comments)    Other reaction(s): Other (see comments) Psychotic behaviour    . Erythromycin     Other reaction(s): Other (See Comments) Severe stomach pain   . Erythromycin Base Hives  . Milnacipran Hcl Other (See Comments)    Other reaction(s):  psychotic episode     . Prednisone Other (See Comments)    Anxiety & Nervous Breakdown.  Wendy Barber  [Milnacipran]   . Povidone Iodine Rash    Had rash under bandage after surgery , has had betadine since without reaction   . Prednisolone Anxiety    Patient Measurements: Height: 5\' 6"  (167.6 cm) Weight: 102.1 kg (225 lb) IBW/kg (Calculated) : 59.3 Heparin Dosing Weight: 82 kg  Vital Signs: Temp: 98.8 F (37.1 C) (04/13 1527) Temp Source: Oral (04/13 1527) BP: 150/87 (04/13 1527) Pulse Rate: 72 (04/13 1527)  Labs: Recent Labs    02/01/21 0600 02/02/21 0515 02/02/21 1714 02/03/21 0215 02/03/21 0935 02/03/21 1555  HGB 10.4* 10.2*  --  10.9*  --   --   HCT 33.1* 33.4*  --  34.7*  --   --   PLT 394 251  --  385  --   --   LABPROT  --  14.4  --   --   --   --   INR  --  1.2  --   --   --   --   HEPARINUNFRC  --   --    < > 0.76* 0.62 0.44  CREATININE 0.59 0.45  --  0.61  --   --    < > = values in this interval not displayed.    Estimated Creatinine Clearance: 92.4 mL/min (by C-G formula based on SCr of 0.61 mg/dL).   Medications:  - on lovenox 40mg  daily  PTA  Assessment: Pt's a 60 y.o. F with complicated medical hx that includes metastatic appendiceal cancer, autoimmune hepatitis, CRS/HIPEC for recurrent cancer, and PE/DVT (s/p IVC filter) presented to the ED on 3/26 with c/o n/v/d.  During this admission, she was treated for cdiff and is currently on abx for abdominal surgical wound site infection.  Abdominal CT on 4/11 showed findings with "concern for propagation of clot in the superior mesenteric vein extending into the main portal vein" and "for DVT in the right groin extending into the right external iliac vein." LE doppler on 4/12 showed extensive RLE DVT.  She's currently on heparin drip for VTE.   Significant Events: 4/12: Drain replacement in IR.  Per Rowe Robert, PA- ok to start heparin & do not need to stop for procedure.   Today, 02/03/2021: - recheck heparin level is therapeutic at 0.44 with drip infusing at 1600 units/hr - cbc stable this AM - no bleeding noted    Goal of Therapy:  Heparin level 0.3-0.7 units/ml Monitor platelets by anticoagulation protocol: Yes   Plan:  - continue heparin drip 1600 units/hr - monitor for s/sx bleeding - daily CBC & heparin  level while on heparin  Wendy Barber, PharmD, [facility]: [phone] 02/03/2021 5:47 PM

## 2021-02-03 NOTE — Evaluation (Signed)
Physical Therapy Evaluation Patient Details Name: Wendy Barber MRN: [id number] DOB: May 15, 1961 Today's Date: 02/03/2021   History of Present Illness  The patient is a 60 year old Caucasian female admitted with C. difficile colitis and for concern for postop wound infection.  Pt with hx of DVT and IVC filter placement, now with acute DVT (SMV, PVT, Iliac thrombus).  PMHx significant for but not limited to metastatic appendiceal cancer, pseudomyxoma peritonei and peritoneal carcinomatosis status post CRS/HIPEC and splenectomy in 2005 and recent CRS/HIPEC on 12/10/2020 for recurrent cancer, autoimmune hepatitis, DVT and PE with IVC filter currently on Lovenox, hypertension, diabetes mellitus type 2  Clinical Impression  Pt admitted with above diagnosis.  Pt currently with functional limitations due to the deficits listed below (see PT Problem List). Pt will benefit from skilled PT to increase their independence and safety with mobility to allow discharge to the venue listed below.  Pt plans to return home upon d/c and states her husband is available weekends only.  Pt states she has DME at home and a hx of dizziness/syncope episodes at baseline (unknown cause and seen by multiple doctors per pt report).  Pt assisted with ambulating short distance around bed and reported mild dizziness upon sitting in recliner.     02/03/21 1014  Vital Signs  Pulse Rate 71  Pulse Rate Source Monitor  BP (!) 154/91  BP Location Right Arm  BP Method Automatic  Patient Position (if appropriate) Sitting (after ambulating in room)  Oxygen Therapy  SpO2 94 %  O2 Device Room Air      Follow Up Recommendations No PT follow up    Equipment Recommendations  None recommended by PT    Recommendations for Other Services       Precautions / Restrictions Precautions Precautions: Other (comment);Fall Precaution Comments: low abd wound & drain; reports history of unexplained syncope causing passing out (approx 4 x a  year), orthostatic hypotension      Mobility  Bed Mobility Overal bed mobility: Modified Independent                  Transfers Overall transfer level: Needs assistance Equipment used: None Transfers: Sit to/from Stand Sit to Stand: Min guard         General transfer comment: min/guard for safety due to hx of dizziness/syncope  Ambulation/Gait Ambulation/Gait assistance: Min guard Gait Distance (Feet): 10 Feet Assistive device: None Gait Pattern/deviations: Step-through pattern;Decreased stride length     General Gait Details: slow pace, pt denies dizziness until sitting in recliner, held onto bed rail for a little extra support (ambulated around room only)  Financial trader Rankin (Stroke Patients Only)       Balance                                             Pertinent Vitals/Pain Pain Assessment: Faces Faces Pain Scale: Hurts a little bit Pain Location: wound site Pain Descriptors / Indicators: Grimacing Pain Intervention(s): Repositioned;Monitored during session    Home Living Family/patient expects to be discharged to:: Private residence Living Arrangements: Spouse/significant other Available Help at Discharge: Family Type of Home: House Home Access: Stairs to enter Entrance Stairs-Rails: Left Entrance Stairs-Number of Steps: 3 Home Layout: One level Home Equipment: Clinical cytogeneticist - 2 wheels;Bedside commode;Walker - 4  wheels      Prior Function Level of Independence: Needs assistance   Gait / Transfers Assistance Needed: independent  ADL's / Homemaking Assistance Needed: a little help with LB dressing after surgery        Hand Dominance   Dominant Hand: Right    Extremity/Trunk Assessment        Lower Extremity Assessment Lower Extremity Assessment: Generalized weakness    Cervical / Trunk Assessment Cervical / Trunk Assessment: Normal  Communication    Communication: No difficulties  Cognition Arousal/Alertness: Awake/alert Behavior During Therapy: WFL for tasks assessed/performed Overall Cognitive Status: Within Functional Limits for tasks assessed                                        General Comments General comments (skin integrity, edema, etc.): Pt denies vertigo and states she hx of syncope episodes and dizziness which have been evaluated numerous times by neurologist and cardiologist (per pt).    Exercises     Assessment/Plan    PT Assessment Patient needs continued PT services  PT Problem List Decreased strength;Decreased activity tolerance;Decreased balance;Decreased knowledge of use of DME;Decreased mobility       PT Treatment Interventions DME instruction;Gait training;Balance training;Therapeutic exercise;Functional mobility training;Therapeutic activities;Patient/family education    PT Goals (Current goals can be found in the Care Plan section)  Acute Rehab PT Goals PT Goal Formulation: With patient Time For Goal Achievement: 02/17/21 Potential to Achieve Goals: Good    Frequency Min 3X/week   Barriers to discharge        Co-evaluation               AM-PAC PT "6 Clicks" Mobility  Outcome Measure Help needed turning from your back to your side while in a flat bed without using bedrails?: A Little Help needed moving from lying on your back to sitting on the side of a flat bed without using bedrails?: A Little Help needed moving to and from a bed to a chair (including a wheelchair)?: A Little Help needed standing up from a chair using your arms (e.g., wheelchair or bedside chair)?: A Little Help needed to walk in hospital room?: A Little Help needed climbing 3-5 steps with a railing? : A Lot 6 Click Score: 17    End of Session   Activity Tolerance: Patient tolerated treatment well Patient left: in chair;with call bell/phone within reach (pt has been using call bell and agreeable to  continue) Nurse Communication: Mobility status PT Visit Diagnosis: Difficulty in walking, not elsewhere classified (R26.2)    Time: 0076-2263 PT Time Calculation (min) (ACUTE ONLY): 19 min   Charges:   PT Evaluation $PT Eval Low Complexity: 1 Low        Kati PT, DPT Acute Rehabilitation Services Pager: [phone] Office: [phone]   York Ram E 02/03/2021, 11:47 AM

## 2021-02-04 ENCOUNTER — Inpatient Hospital Stay (HOSPITAL_COMMUNITY): Payer: Medicare Other

## 2021-02-04 DIAGNOSIS — T8149XA Infection following a procedure, other surgical site, initial encounter: Secondary | ICD-10-CM | POA: Diagnosis not present

## 2021-02-04 LAB — BASIC METABOLIC PANEL
Anion gap: 7 (ref 5–15)
BUN: <5 mg/dL — ABNORMAL LOW (ref 6–20)
CO2: 25 mmol/L (ref 22–32)
Calcium: 8.5 mg/dL — ABNORMAL LOW (ref 8.9–10.3)
Chloride: 107 mmol/L (ref 98–111)
Creatinine, Ser: 0.47 mg/dL (ref 0.44–1.00)
GFR, Estimated: >60 mL/min (ref >60)
Glucose, Bld: 112 mg/dL — ABNORMAL HIGH (ref 70–99)
Potassium: 2.9 mmol/L — ABNORMAL LOW (ref 3.5–5.1)
Sodium: 139 mmol/L (ref 135–145)

## 2021-02-04 LAB — CBC
HCT: 33.1 % — ABNORMAL LOW (ref 36.0–46.0)
Hemoglobin: 10.5 g/dL — ABNORMAL LOW (ref 12.0–15.0)
MCH: 31 pg (ref 26.0–34.0)
MCHC: 31.7 g/dL (ref 30.0–36.0)
MCV: 97.6 fL (ref 80.0–100.0)
Platelets: 351 10*3/uL (ref 150–400)
RBC: 3.39 MIL/uL — ABNORMAL LOW (ref 3.87–5.11)
RDW: 19.8 % — ABNORMAL HIGH (ref 11.5–15.5)
WBC: 8.5 10*3/uL (ref 4.0–10.5)
nRBC: 0.2 % (ref 0.0–0.2)

## 2021-02-04 LAB — HEPARIN LEVEL (UNFRACTIONATED): Heparin Unfractionated: 0.6 IU/mL (ref 0.30–0.70)

## 2021-02-04 MED ORDER — RIVAROXABAN 15 MG PO TABS
15.0000 mg | ORAL_TABLET | Freq: Two times a day (BID) | ORAL | Status: DC
  Administered 2021-02-04 – 2021-02-10 (×13): 15 mg via ORAL
  Filled 2021-02-04 (×13): qty 1

## 2021-02-04 MED ORDER — SODIUM CHLORIDE 0.9 % IV SOLN: INTRAVENOUS | Status: DC

## 2021-02-04 MED ORDER — IOHEXOL 300 MG/ML  SOLN
100.0000 mL | Freq: Once | INTRAMUSCULAR | Status: AC | PRN
  Administered 2021-02-04: 100 mL via INTRAVENOUS

## 2021-02-04 MED ORDER — POTASSIUM CHLORIDE CRYS ER 20 MEQ PO TBCR
40.0000 meq | EXTENDED_RELEASE_TABLET | ORAL | Status: AC
  Administered 2021-02-04 (×2): 40 meq via ORAL
  Filled 2021-02-04 (×2): qty 2

## 2021-02-04 MED ORDER — RIVAROXABAN 20 MG PO TABS: 20.0000 mg | ORAL_TABLET | Freq: Every day | ORAL | Status: DC

## 2021-02-04 MED ORDER — POTASSIUM CHLORIDE 10 MEQ/100ML IV SOLN
10.0000 meq | INTRAVENOUS | Status: AC
  Administered 2021-02-04 (×2): 10 meq via INTRAVENOUS
  Filled 2021-02-04 (×2): qty 100

## 2021-02-04 NOTE — Progress Notes (Signed)
[place] for heparin --> xarelto Indication: acute VTE  Allergies  Allergen Reactions  . Penicillins Shortness Of Breath    Other reaction(s): Irregular Heart Rate, Other (See Comments) Rapid heartrate   . Alprazolam Hives and Other (See Comments)    Note: tolerates midazolam fine unresponsive Hard to arouse unresponsive Hard to arouse  Other reaction(s): Other (See Comments) unresponsive  . Ativan [Lorazepam] Other (See Comments)    Note: tolerates midazolam fine Face & Throat Swelling.  . Corticosteroids Other (See Comments)    Other reaction(s): Other (see comments) Psychotic behaviour    . Erythromycin     Other reaction(s): Other (See Comments) Severe stomach pain   . Erythromycin Base Hives  . Milnacipran Hcl Other (See Comments)    Other reaction(s):  psychotic episode     . Prednisone Other (See Comments)    Anxiety & Nervous Breakdown.  Wendy Barber  [Milnacipran]   . Povidone Iodine Rash    Had rash under bandage after surgery , has had betadine since without reaction   . Prednisolone Anxiety    Patient Measurements: Height: 5\' 6"  (167.6 cm) Weight: 103.9 kg (229 lb 0.9 oz) IBW/kg (Calculated) : 59.3 Heparin Dosing Weight: 82 kg  Vital Signs: Temp: 98.3 F (36.8 C) (04/14 0618) Temp Source: Oral (04/14 0618) BP: 169/90 (04/14 0618) Pulse Rate: 66 (04/14 0618)  Labs: Recent Labs    02/02/21 0515 02/02/21 1714 02/03/21 0215 02/03/21 0935 02/03/21 1555 02/04/21 0545  HGB 10.2*  --  10.9*  --   --  10.5*  HCT 33.4*  --  34.7*  --   --  33.1*  PLT 251  --  385  --   --  351  LABPROT 14.4  --   --   --   --   --   INR 1.2  --   --   --   --   --   HEPARINUNFRC  --    < > 0.76* 0.62 0.44 0.60  CREATININE 0.45  --  0.61  --   --  0.47   < > = values in this interval not displayed.    Estimated Creatinine Clearance: 93.3 mL/min (by C-G formula based on SCr of 0.47 mg/dL).   Medications:  - on  lovenox 40mg  daily PTA  Assessment: Pt's a 60 y.o. F with complicated medical hx that includes metastatic appendiceal cancer, autoimmune hepatitis, CRS/HIPEC for recurrent cancer, and PE/DVT (s/p IVC filter) presented to the ED on 3/26 with c/o n/v/d.  During this admission, she was treated for cdiff and is currently on abx for abdominal surgical wound site infection.  Abdominal CT on 4/11 showed findings with "concern for propagation of clot in the superior mesenteric vein extending into the main portal vein" and "for DVT in the right groin extending into the right external iliac vein." LE doppler on 4/12 showed extensive RLE DVT.  She's currently on heparin drip for VTE.   Significant Events: - 4/12: Drain replacement in IR.  Per Rowe Robert, PA- ok to start heparin & do not need to stop for procedure.   Today, 02/04/2021: - heparin level is therapeutic at 0.60 with drip infusing at 1600 units/hr - cbc stable - no bleeding documented - pharmacy has been consulted today to transition patient to xarelto   Goal of Therapy:  Heparin level 0.3-0.7 units/ml Monitor platelets by anticoagulation protocol: Yes   Plan:  - d/c heparin drip - start  xarelto 15 mg bid x21 days, then 20 mg daily - monitor for s/sx bleeding - Pharmacy will sign off for xarelto, but will follow pt peripherally along with you.  Dia Sitter, PharmD, BCPS 02/04/2021 7:09 AM

## 2021-02-04 NOTE — Progress Notes (Signed)
PROGRESS NOTE  Wendy Barber  DOB: 05/13/1961  PCP: Glendon Axe, MD WNI:[id number]  DOA: 01/16/2021  LOS: 19 days   Chief Complaint  Patient presents with  . Diarrhea  . Wound Check    Brief narrative: Wendy Barber is a 60 y.o. female with PMH significant for metastatic appendiceal cancer with extensive surgical history as below, also with medical history of autoimmune hepatitis, liver cirrhosis, DVT and PE with IVC filter chronically on Lovenox, hypertension, diabetes mellitus type 2.. 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  She was in remission for 17 years.  However, in February 2022, patient had a remission.  She followed up at [facility] with Dr. Sharon Mt and underwent another debulking and administration of intraperitoneal chemotherapy.  Unfortunately, she had complications from this. She had an anastomotic leak that required another surgery.  She then had a open abdominal wound that required another surgery. Finally, she was able to come back to [place] in few weeks.  By this he started having persistent diarrhea leading to generalized weakness.  She also noted open discharge from the incision wound and hence presented to our ED on 3/26.   In the ED, she tested C. difficile positive. CT abdomen pelvis showed gas and fluid collection within the anterior abdominal wall, ventral surgical wound measuring 3.2 x 1.8 x 13 cm concerning for wound infection.  There was also a loculated fluid collection in the pelvis just superior and anterior to the urinary bladder measuring 5.5 x 2.6 x 3.8 cm, question sterile versus infected postoperative collection.  Patient was admitted to hospitalist service. General surgery, IR, medical oncology consultations were obtained.   Drain placed by IR for  intra-abdominal abscess.  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 See below for details  Subjective: Patient was seen and examined this morning.  Pleasant middle-aged Caucasian female frustrated with prolonged course of hospital stay. Orthostatic vital signs taken this morning.  Patient felt dizzy on standing up.  Blood pressure dropped from 143/79 to 111/78 on standing up.  Assessment/Plan: Postsurgical abdominal wound infection Abdominal wall abscess s/p drain placement by IR on 01/27/2021 Enterocutaneous fistula - 4/12 -Patient presented with concerns for abdominal postsurgical wound infection. -4/6, IR placed a 10 French drain in her abscess and yielded 65 cc of amber-colored fluid. Her drain was connected to gravity bag.   -Culture from the drain grew Klebsiella pneumoniae.  -Per ID recommendation, patient is on cefdinir 300 mg p.o. twice daily and Flagyl 5 mg p.o. twice daily for 6 weeks until 5/20.  -4/12 repeat CT scan of abdomen suggested the presence of enterocutaneous fistula.  Per general surgery and IR, EC fistula is related to previous surgery done in [place] in February.  Recommendation is to continue the current drain and follow-up with general surgeons at [facility] for further evaluation of enterocutaneous fistula.  I discussed the case with IR Dr. Anselm Pancoast again this morning.  Repeat CT abdomen and pelvis was suggested to follow-up on the effectiveness of drain.  C. difficile colitis -Diarrhea improving.  Per ID recommendation, currently on prolonged course of oral vancomycin because of use of IV antibiotics.  Recommended to continue oral vancomycin for the duration of antibiotics +1 more week.   -Continue probiotics, low-dose Lomotil. -4/2, A dose of Dificid was given  Intractable abdominal pain -Due to colitis, intra-abdominal abscesses, EC fistula, intra-abdominal cancer -Currently being  controlled with fentanyl patch, IV Dilaudid, oral Dilaudid. -I  discussed with patient to cut back on her opioids.  We will stop IV Dilaudid at this time and continue with fentanyl patch and as needed oral Dilaudid  History of metastatic appendiceal cancer, pseudomyxoma peritonei and peritoneal carcinomatosis status post CRS/HI PEC and splenectomy in 2005 and recently CRS/HI PEC on 12/10/2020 for recurrent cancer -Patient sees Dr. [name] here in town but most of her cancer care was done at [facility].  -Will need to follow up at D/C with Surgeon in [facility] or Specialist at [facility]  Acute DVT of right common femoral vein, right popliteal vein, right posterior tibial vein and right peroneal veins, supra mesenteric vein, portal vein and iliac vein - 4/12 History of DVT and PE with IVC filter  -Was on prophylactic dose of Lovenox at home.  Switch to heparin drip.  Subsequently switched to oral Xarelto today.  Orthostatic hypotension EssentialHypertension -Patient is having orthostatic hypotension with dizziness despite of elevated baseline blood pressure in 140s and 150s.  This morning, blood pressure dropped from 143/79  to 111/78 on standing up.   -For now we will continue propranolol 20 mg twice daily but keep amlodipine on hold. -Continue to monitor blood pressure.  History of autoimmune Hepatitis, liver cirrhosis -Patient was on azathioprine for a year before it was stopped in February 2022 prior to the surgery.  Continue to hold.  I would defer to her surgeons in [place] to determine on resuming it once the intra-abdominal infection is controlled. -LFTs currently normal.  Essential Tremor -Continue Propranolol 20 mg po BID  Anxiety -C/w Buspirone 5 mg p.o. daily with breakfast, 5 mg p.o. daily, and then 10 mg p.o. nightly. -May need Psych to see given Depression due to situation  Type 2 Diabetes Mellitus  -A1c 5.8 on 3/26. -Diet controlled.  Hypokalemia -Potassium level significantly low at 2.9 today.  Oral and IV replacement ordered.   Recheck tomorrow. Recent Labs  Lab 01/30/21 0531 01/31/21 0607 02/01/21 0600 02/02/21 0515 02/03/21 0215 02/04/21 0545  K 4.3 3.8 3.6 3.5 3.4* 2.9*  MG 2.0 1.9 1.8 1.9 1.8  --   PHOS 3.7 4.0 3.6 3.3 3.3  --    Chronic normocytic anemia -Hemoglobin stable between 10-11.    Mobility: Encourage ambulation.  PT eval obtained. No f/u recommended. Code Status:   Code Status: Full Code  Nutritional status: Body mass index is 36.97 kg/m. Nutrition Problem: Increased nutrient needs Etiology: cancer and cancer related treatments Signs/Symptoms: estimated needs Diet Order            Diet Heart Room service appropriate? Yes; Fluid consistency: Thin  Diet effective now                 DVT prophylaxis: Place TED hose Start: 01/31/21 1856   Antimicrobials:  Metronidazole IV, cefdinir oral, vancomycin oral Fluid: Normal saline 75 mL/h for orthostatic hypotension Consultants: Oncology, ID, surgery, IR Family Communication:  None at bedside.  Spoke to husband on the phone  Status is: Inpatient  Remains inpatient appropriate because: Pending CT scan of abdomen and pelvis to evaluate for drain management.  If no new significant finding on CT abdomen and if pain tolerable on oral medications, can be discharged home in next 1 to 2 days.  Dispo: The patient is from: Home              Anticipated d/c is to: Home with home health RN  Patient currently is not medically stable to d/c.   Difficult to place patient No       Infusions:  . sodium chloride 75 mL/hr at 02/04/21 1025  . metronidazole 500 mg (02/04/21 1034)  . potassium chloride 10 mEq (02/04/21 1144)    Scheduled Meds: . amLODipine  10 mg Oral Daily  . busPIRone  5 mg Oral Q breakfast   And  . busPIRone  5 mg Oral Q1200   And  . busPIRone  10 mg Oral QHS  . cefdinir  300 mg Oral Q12H  . famotidine  40 mg Oral Daily  . fentaNYL  1 patch Transdermal Q72H  . Gerhardt's butt cream   Topical QID  .  multivitamin with minerals  1 tablet Oral Daily  . polycarbophil  625 mg Oral Daily  . propranolol  20 mg Oral BID  . protein supplement  1 Scoop Oral TID WC  . Rivaroxaban  15 mg Oral BID WC   Followed by  . [START ON 02/25/2021] rivaroxaban  20 mg Oral Q supper  . saccharomyces boulardii  250 mg Oral BID  . scopolamine  1 patch Transdermal Q72H  . sodium chloride flush  3 mL Intravenous Q12H  . sodium chloride flush  5 mL Intracatheter Q8H  . vancomycin  125 mg Oral Q12H    Antimicrobials: Anti-infectives (From admission, onward)   Start     Dose/Rate Route Frequency Ordered Stop   02/01/21 2200  metroNIDAZOLE (FLAGYL) IVPB 500 mg        500 mg 100 mL/hr over 60 Minutes Intravenous Every 12 hours 02/01/21 1804     01/30/21 1000  vancomycin (VANCOCIN) 50 mg/mL oral solution 125 mg        125 mg Oral Every 12 hours 01/29/21 1431     01/29/21 2200  metroNIDAZOLE (FLAGYL) tablet 500 mg  Status:  Discontinued        500 mg Oral Every 12 hours 01/29/21 1700 02/01/21 1804   01/29/21 1800  metroNIDAZOLE (FLAGYL) tablet 500 mg  Status:  Discontinued        500 mg Oral Every 8 hours 01/29/21 1431 01/29/21 1700   01/29/21 1530  cefdinir (OMNICEF) capsule 300 mg        300 mg Oral Every 12 hours 01/29/21 1431     01/28/21 1800  metroNIDAZOLE (FLAGYL) IVPB 500 mg  Status:  Discontinued        500 mg 100 mL/hr over 60 Minutes Intravenous Every 8 hours 01/28/21 1517 01/29/21 1431   01/28/21 1600  ciprofloxacin (CIPRO) IVPB 400 mg  Status:  Discontinued        400 mg 200 mL/hr over 60 Minutes Intravenous Every 12 hours 01/28/21 1517 01/29/21 1431   01/23/21 1315  vancomycin (VANCOCIN) 50 mg/mL oral solution 125 mg        125 mg Oral Every 6 hours 01/23/21 1226 01/29/21 2359   01/23/21 1000  fidaxomicin (DIFICID) tablet 200 mg  Status:  Discontinued        200 mg Oral 2 times daily 01/23/21 0859 01/23/21 1226   01/17/21 0600  vancomycin (VANCOREADY) IVPB 1500 mg/300 mL  Status:  Discontinued         1,500 mg 150 mL/hr over 120 Minutes Intravenous Every 24 hours 01/16/21 1732 01/17/21 1216   01/16/21 2200  metroNIDAZOLE (FLAGYL) IVPB 500 mg  Status:  Discontinued        500 mg 100 mL/hr over 60 Minutes Intravenous  Every 8 hours 01/16/21 1622 01/17/21 1216   01/16/21 2000  vancomycin (VANCOCIN) 50 mg/mL oral solution 125 mg  Status:  Discontinued        125 mg Oral 4 times daily 01/16/21 1805 01/23/21 0859   01/16/21 1800  ceFEPIme (MAXIPIME) 2 g in sodium chloride 0.9 % 100 mL IVPB  Status:  Discontinued        2 g 200 mL/hr over 30 Minutes Intravenous Every 8 hours 01/16/21 1728 01/17/21 1216   01/16/21 1545  vancomycin (VANCOCIN) 50 mg/mL oral solution 125 mg  Status:  Discontinued        125 mg Oral 4 times daily 01/16/21 1539 01/16/21 1622   01/16/21 1445  metroNIDAZOLE (FLAGYL) IVPB 500 mg        500 mg 100 mL/hr over 60 Minutes Intravenous  Once 01/16/21 1438 01/16/21 1622   01/16/21 1330  vancomycin (VANCOCIN) IVPB 1000 mg/200 mL premix        1,000 mg 200 mL/hr over 60 Minutes Intravenous  Once 01/16/21 1320 01/16/21 1538      PRN meds: acetaminophen, calcium carbonate, diclofenac Sodium, diphenoxylate-atropine, hydrALAZINE, HYDROmorphone, ondansetron **OR** ondansetron (ZOFRAN) IV, simethicone   Objective: Vitals:   02/03/21 2046 02/04/21 0618  BP: 133/75 (!) 169/90  Pulse: 66 66  Resp: 18 18  Temp: 98.4 F (36.9 C) 98.3 F (36.8 C)  SpO2: 94% 91%    Intake/Output Summary (Last 24 hours) at 02/04/2021 1148 Last data filed at 02/04/2021 1030 Gross per 24 hour  Intake 780 ml  Output --  Net 780 ml   Filed Weights   01/16/21 1700 02/04/21 0618  Weight: 102.1 kg 103.9 kg   Weight change:  Body mass index is 36.97 kg/m.   Physical Exam: General exam: Pleasant middle-aged Caucasian female.  Mild to moderate physical distress Skin: No rashes, lesions or ulcers. HEENT: Atraumatic, normocephalic, no obvious bleeding Lungs: Clear to auscultation  bilaterally CVS: Regular rate and rhythm, no murmur GI/Abd soft, mild to moderate diffuse tenderness, has intra-abdominal drain in place CNS: Alert, awake, oriented x3 Psychiatry: Frustrated and overwhelmed with the complexity of her care. Extremities: No pedal edema, no calf tenderness  Data Review: I have personally reviewed the laboratory data and studies available.  Recent Labs  Lab 01/30/21 0531 01/31/21 0607 02/01/21 0600 02/02/21 0515 02/03/21 0215 02/04/21 0545  WBC 7.8 8.8 9.4 7.9 8.8 8.5  NEUTROABS 2.2 2.9 2.9 2.6 3.2  --   HGB 10.7* 10.6* 10.4* 10.2* 10.9* 10.5*  HCT 34.7* 35.2* 33.1* 33.4* 34.7* 33.1*  MCV 99.4 101.4* 100.0 100.0 99.1 97.6  PLT 417* 415* 394 251 385 351   Recent Labs  Lab 01/30/21 0531 01/31/21 0607 02/01/21 0600 02/02/21 0515 02/03/21 0215 02/04/21 0545  NA 141 137 138 137 139 139  K 4.3 3.8 3.6 3.5 3.4* 2.9*  CL 104 103 107 107 107 107  CO2 28 25 25 23 24 25   GLUCOSE 116* 118* 117* 107* 116* 112*  BUN 9 11 10 6  5* <5*  CREATININE 0.53 0.64 0.59 0.45 0.61 0.47  CALCIUM 9.0 8.7* 8.4* 8.2* 8.4* 8.5*  MG 2.0 1.9 1.8 1.9 1.8  --   PHOS 3.7 4.0 3.6 3.3 3.3  --     F/u labs ordered Unresulted Labs (From admission, onward)          Start     Ordered   02/04/21 8115  Basic metabolic panel  Daily,   R     Question:  Specimen collection method  Answer:  Lab=Lab collect   02/03/21 1745   01/16/21 1201  CBC with Differential/Platelet  Once,   R        01/16/21 1201          Signed, Terrilee Croak, MD [facility] 02/04/2021

## 2021-02-04 NOTE — Progress Notes (Signed)
Wendy Barber seems to doing okay right now.  I think we can probably switch her over to oral anticoagulation.  I do not think that there are any other invasive procedures none need to be done.  She still has the drainage catheter and.  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  She has a thrombus in the right leg.  Again she is on heparin.  I think we can switch her over to Xarelto.  I think this might help try to move things along with respect to getting her home.  Her appetite is a little bit better.  She still has occasional bouts of diarrhea although this is not bad at all.  I think she was will have an element of loose stools is because of her past surgeries.  She had a liver ultrasound done yesterday.  There was cirrhotic liver.  The portal vein.  Patent.  There is no evidence of malignancy or abscess or thrombus.  Her pain seems to be doing a little bit better.  We increased her Duragesic patch to 50 mcg yesterday.  Hopefully she will feel that effect today.  I do not know if there is still issues with hypotension when she gets up.  I know that she has been seen by physical therapy and Occupational Therapy.  She may need this when she goes home.  Her labs today show white cell count 8.5.  Hemoglobin 10.5.  Platelet count 351,000.  Her potassium is quite low at 2.9.  Her creatinine is 0.47.  On her exam, there is no swelling in the right leg.  She has no firmness of the right leg.  Her abdomen is soft.  Bowel sounds are present.  She has dressing over the lower abdominal wall.  Lungs are clear bilaterally.  Cardiac exam regular rate and rhythm.  Again, hopefully we can start to think about getting her home soon.  I appreciate everybody's help with [name].  I know this is been quite complicated with all the issues that she has had.  [name], MD  [name] 22:20

## 2021-02-04 NOTE — Discharge Instructions (Signed)
Information on my medicine - XARELTO (rivaroxaban)  This medication education was reviewed with me or my healthcare representative as part of my discharge preparation.   WHY WAS XARELTO PRESCRIBED FOR YOU? Xarelto was prescribed to treat blood clots that may have been found in the veins of your legs (deep vein thrombosis) or in your lungs (pulmonary embolism) and to reduce the risk of them occurring again.  What do you need to know about Xarelto? The starting dose is one 15 mg tablet taken TWICE daily with food for the FIRST 21 DAYS then on 02/25/21  the dose is changed to one 20 mg tablet taken ONCE A DAY with your evening meal.  DO NOT stop taking Xarelto without talking to the health care provider who prescribed the medication.  Refill your prescription for 20 mg tablets before you run out.  After discharge, you should have regular check-up appointments with your healthcare provider that is prescribing your Xarelto.  In the future your dose may need to be changed if your kidney function changes by a significant amount.  What do you do if you miss a dose? If you are taking Xarelto TWICE DAILY and you miss a dose, take it as soon as you remember. You may take two 15 mg tablets (total 30 mg) at the same time then resume your regularly scheduled 15 mg twice daily the next day.  If you are taking Xarelto ONCE DAILY and you miss a dose, take it as soon as you remember on the same day then continue your regularly scheduled once daily regimen the next day. Do not take two doses of Xarelto at the same time.   Important Safety Information Xarelto is a blood thinner medicine that can cause bleeding. You should call your healthcare provider right away if you experience any of the following: ? Bleeding from an injury or your nose that does not stop. ? Unusual colored urine (red or dark brown) or unusual colored stools (red or black). ? Unusual bruising for unknown reasons. ? A serious fall or  if you hit your head (even if there is no bleeding).  Some medicines may interact with Xarelto and might increase your risk of bleeding while on Xarelto. To help avoid this, consult your healthcare provider or pharmacist prior to using any new prescription or non-prescription medications, including herbals, vitamins, non-steroidal anti-inflammatory drugs (NSAIDs) and supplements.  This website has more information on Xarelto: [email or web address]

## 2021-02-04 NOTE — Progress Notes (Addendum)
Referring Physician(s): Dr.Enever, P.  Supervising Physician: Markus Daft  Patient Status:  [facility] - In-pt  Chief Complaint:  S/p intraabdominal abscess drain placement with Dr. [name] on 01/27/21 S/p repositioning of the existing drain with Dr. Laurence Ferrari on 02/02/21    Subjective:  Patient stable, reports mild tenderness around the drain site.  Denies fever, chills, abdominal pain, nausea, vomiting.  Allergies: Penicillins, Alprazolam, Ativan [lorazepam], Corticosteroids, Erythromycin, Erythromycin base, Milnacipran hcl, Prednisone, Savella  [milnacipran], Povidone iodine, and Prednisolone  Medications: Prior to Admission medications   Medication Sig Start Date End Date Taking? Authorizing Provider  busPIRone (BUSPAR) 5 MG tablet Take 5 mg by mouth See admin instructions. Takes 1 tablet in the morning, 1 tablet in the afternoon and 2 tablets at night   Yes [provider]  diphenoxylate-atropine (LOMOTIL) 2.5-0.025 MG tablet Take 1 tablet by mouth daily as needed for diarrhea or loose stools. 01/15/21  Yes [provider]  enoxaparin (LOVENOX) 40 MG/0.4ML injection Inject 40 mg into the skin daily. 01/01/21  Yes [provider]  HYDROmorphone (DILAUDID) 2 MG tablet Take 2 mg by mouth every 4 (four) hours as needed for moderate pain. 01/08/21  Yes [provider]  loperamide (IMODIUM) 2 MG capsule Take 4 mg by mouth in the morning, at noon, in the evening, and at bedtime. 01/01/21  Yes [provider]  ondansetron (ZOFRAN) 8 MG tablet Take 8 mg by mouth every 8 (eight) hours as needed for nausea or vomiting. 02/26/20  Yes [provider]  pantoprazole (PROTONIX) 40 MG tablet Take 40 mg by mouth every morning. 01/01/21  Yes [provider]  diazepam (VALIUM) 5 MG tablet Take 40 minutes prior to MRI Patient not taking: No sig reported 07/16/20   Volanda Napoleon, MD  insulin glargine (LANTUS) 100 UNIT/ML injection Inject 0.1 mLs (10  Units total) into the skin at bedtime. Before bed Patient not taking: No sig reported 11/26/19   Nita Sells, MD  insulin lispro (HUMALOG) 100 UNIT/ML injection Inject 0.08 mLs (8 Units total) into the skin 3 (three) times daily before meals. Patient not taking: No sig reported 11/26/19   Nita Sells, MD     Vital Signs: BP (!) 169/90   Pulse 66   Temp 98.3 F (36.8 C) (Oral)   Resp 18   Ht 5\' 6"  (1.676 m)   Wt 229 lb 0.9 oz (103.9 kg)   SpO2 91%   BMI 36.97 kg/m   Physical Exam Vitals and nursing note reviewed.  Constitutional:      General: She is not in acute distress.    Appearance: Normal appearance. She is not ill-appearing.  HENT:     Head: Normocephalic.  Cardiovascular:     Rate and Rhythm: Normal rate.     Pulses: Normal pulses.  Pulmonary:     Effort: Pulmonary effort is normal. No respiratory distress.  Abdominal:     Palpations: Abdomen is soft.     Comments: Positive midline drain to a gravity bag. Site is unremarkable with no erythema, edema, tenderness, bleeding or drainage. Suture and stat lock in place. Dressing is clean, dry, and intact. 10 ml of purulent fluid noted in the gravity bag. Drain flushes well.   Skin:    General: Skin is warm and dry.  Neurological:     Mental Status: She is alert and oriented to person, place, and time.  Psychiatric:        Mood and Affect: Mood normal.  Behavior: Behavior normal.     Imaging: CT ABDOMEN PELVIS W CONTRAST  Result Date: 02/02/2021 CLINICAL DATA:  Follow-up abdominal abscess. Status post drain placement on 01/27/2021. History of appendiceal cancer. EXAM: CT ABDOMEN AND PELVIS WITH CONTRAST TECHNIQUE: Multidetector CT imaging of the abdomen and pelvis was performed using the standard protocol following bolus administration of intravenous contrast. CONTRAST:  185mL OMNIPAQUE IOHEXOL 300 MG/ML  SOLN COMPARISON:  CT abdomen 01/26/2021 FINDINGS: Lower chest: No pleural effusions.  Scattered pleural-based densities are suggestive for atelectasis or mild scarring. No significant airspace disease or consolidation at the lung bases. Hepatobiliary: Liver has a nodular contour and similar to the previous examination. Findings are suspicious for cirrhosis. The gallbladder has been removed. Intrahepatic portal venous system is patent. No focal liver lesion. Pancreas: Unremarkable. No pancreatic ductal dilatation or surrounding inflammatory changes. Common bile duct measures 8 mm and stable. Spleen: Surgically removed. Adrenals/Urinary Tract: Normal appearance of the adrenal glands. Normal appearance of both kidneys without hydronephrosis. Mild fullness of the renal pelvis bilaterally. Normal appearance of the urinary bladder. Stomach/Bowel: Extensive postsurgical bowel changes with bowel anastomotic clips involving the rectosigmoid region and multiple bowel surgical clips in the right lower quadrant of the abdomen. Multiple loops of bowel are in close proximity along the anterior abdominal wall associated with the abdominal wall fluid collection and percutaneous drain. No significant bowel dilatation and no evidence for bowel obstruction. Diffuse mesenteric edema without focal bowel inflammation. Vascular/Lymphatic: Minimal atherosclerotic disease in the abdominal aorta without aneurysm. Main visceral arteries are patent. Again noted is an IVC filter in the infrarenal IVC with filter legs penetrating through the IVC. Again noted is a filling defect involving the superior mesenteric vein and a branch extending into the right abdomen. There is concern that this filling defect is now extending into the main portal vein on sequence 4, image 40. In addition, there is concern for thrombus involving the right common femoral vein and possibly the right external iliac vein but difficult to differentiate thrombus from mixing of blood and contrast in these area. Reproductive: Status post hysterectomy. No  adnexal masses. Other: A percutaneous drain has been placed at the midline and it is extending through the anterior abdominal wall fluid collection. This drain terminates in the midline of the abdomen near loops of bowel. The drain is positioned near surgical bowel clips. There is a low-density collection near the drain which measures up to 3.2 cm on sequence 2, image 53 and not clear if this is a bowel loop or extraluminal collection. There continues to be a large amount of fluid within the anterior abdominal wall which has minimally decreased since placement of the drain. The fluid collection on the sagittal images on sequence 5 image 62 measures 10.1 x 2.8 cm and previously measured 10.5 x 3.3 cm. There is a small amount of gas within the superior aspect of the collection. No new abscess collections. Musculoskeletal: Bilateral pedicle screw and rod fixation at L4-L5. Interbody device at L4-L5. Again noted is severe disc space loss at L3-L4. No acute bone abnormality. IMPRESSION: 1. Anterior abdominal wall fluid collection has minimally decreased in size since placement of the percutaneous drain. Percutaneous drain extends through the collection and terminates in the midline of the abdomen near loops of bowel. Fluid collection near the tip of the catheter within the abdomen could represent a loop of bowel versus extraluminal fluid collection. This collection may be further evaluated with a drain injection. 2. Concern for propagation of clot in  the superior mesenteric vein extending into the main portal vein. In addition, there are is concern for DVT in the right groin extending into the right external iliac vein. Consider further evaluation of the lower extremities with venous duplex imaging. 3. Persistent and slightly increased mesenteric edema. Mesenteric edema is likely related to the postsurgical changes, intra-abdominal / abdominal wall abscess and probably the mesenteric venous thrombus. 4. Liver has a  nodular contour and suspicious for cirrhosis. These results were called by telephone at the time of interpretation on 02/02/2021 at 8:02 am to provider [facility], DO , who verbally acknowledged these results. Electronically Signed   By: Markus Daft M.D.   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 CT-guided drain placed on 01/27/2021. Recent CT imaging demonstrates persistent fluid and gas collection within the anterior abdominal wall. The percutaneous drain is located intraperitoneal adjacent to the small bowel likely at the site of the fistula. Patient presents for repositioning into the anterior abdominal collection. EXAM: Drain exchange MEDICATIONS: The patient is currently admitted to the hospital and receiving intravenous antibiotics. The antibiotics were administered within an appropriate time frame prior to the initiation of the procedure. ANESTHESIA/SEDATION: Fentanyl 100 mcg IV; Versed 3 mg IV Moderate Sedation Time:  12 minutes The patient was continuously monitored during the procedure by the interventional radiology nurse under my direct supervision. COMPLICATIONS: None immediate. PROCEDURE: Informed written consent was obtained from the patient after a thorough discussion of the procedural risks, benefits and alternatives. All questions were addressed. Maximal Sterile Barrier Technique was utilized including caps, mask, sterile gowns, sterile gloves, sterile drape, hand hygiene and skin antiseptic. A timeout was performed prior to the initiation of the procedure. Contrast was injected through the existing catheter. There is direct communication between the catheter in the adjacent small bowel. The catheter was slowly pulled back with additional injection performed.  Contrast opacifies a large fluid collection within the anterior abdominal wall. The catheter was cut and removed over a Bentson wire. A 5 French angled catheter was advanced over the wire and used to direct the wire into the more dependent portion of the abdominal wall fluid collection. A new Cook 2 [place] all-purpose drainage catheter was then advanced over the wire and formed in the fluid collection. Aspiration yields approximately 60 mL of intestinal succus. The catheter was secured to the skin with 0 Prolene suture. IMPRESSION: 1. Contrast injection confirms enterocutaneous fistula. 2. Successful reposition and upsize of existing catheter to a new 35 French catheter which is positioned in the dependent aspect of the intra-abdominal fluid collection. Electronically Signed   By: Jacqulynn Cadet M.D.   On: 02/02/2021 16:25   VAS [place] LOWER EXTREMITY VENOUS (DVT)  Result Date: 02/02/2021  Lower Venous DVT Study Indications: Edema.  Risk Factors: None identified. Limitations: Body habitus and poor ultrasound/tissue interface. Comparison Study: No prior studies. Performing Technologist: Oliver Hum RVT  Examination Guidelines: A complete evaluation includes B-mode imaging, spectral Doppler, color Doppler, and power Doppler as needed of all accessible portions of each vessel. Bilateral testing is considered an integral part of a complete examination. Limited examinations for reoccurring indications may be performed as noted. The reflux portion of the exam is performed with the patient in reverse Trendelenburg.  +---------+---------------+---------+-----------+----------+--------------+ RIGHT    CompressibilityPhasicitySpontaneityPropertiesThrombus Aging +---------+---------------+---------+-----------+----------+--------------+ CFV  None           No       No                   Acute          +---------+---------------+---------+-----------+----------+--------------+ SFJ      None                                          Acute          +---------+---------------+---------+-----------+----------+--------------+ FV Prox  Full                                                        +---------+---------------+---------+-----------+----------+--------------+ FV Mid   Full                                                        +---------+---------------+---------+-----------+----------+--------------+ FV DistalFull                                                        +---------+---------------+---------+-----------+----------+--------------+ PFV      Full                                                        +---------+---------------+---------+-----------+----------+--------------+ POP      Partial        Yes      Yes                  Acute          +---------+---------------+---------+-----------+----------+--------------+ PTV      Partial                                      Acute          +---------+---------------+---------+-----------+----------+--------------+ PERO     Partial                                      Acute          +---------+---------------+---------+-----------+----------+--------------+ Gastroc  Full                                                        +---------+---------------+---------+-----------+----------+--------------+ Thrombus located in the CFV is noted to only be in the distal segment.  +---------+---------------+---------+-----------+----------+--------------+ LEFT     CompressibilityPhasicitySpontaneityPropertiesThrombus Aging +---------+---------------+---------+-----------+----------+--------------+ CFV      Full  Yes      Yes                                 +---------+---------------+---------+-----------+----------+--------------+ SFJ      Full                                                        +---------+---------------+---------+-----------+----------+--------------+ FV  Prox  Full                                                        +---------+---------------+---------+-----------+----------+--------------+ FV Mid   Full                                                        +---------+---------------+---------+-----------+----------+--------------+ FV DistalFull                                                        +---------+---------------+---------+-----------+----------+--------------+ PFV      Full                                                        +---------+---------------+---------+-----------+----------+--------------+ POP      Full           Yes      Yes                                 +---------+---------------+---------+-----------+----------+--------------+ PTV      Full                                                        +---------+---------------+---------+-----------+----------+--------------+ PERO     Full                                                        +---------+---------------+---------+-----------+----------+--------------+     Summary: RIGHT: - Findings consistent with acute deep vein thrombosis involving the right common femoral vein, right popliteal vein, right posterior tibial veins, and right peroneal veins. - No cystic structure found in the popliteal fossa.  LEFT: - There is no evidence of deep vein thrombosis in the lower extremity. However, portions of this examination were limited- see technologist comments above.  *See table(s) above for measurements  and observations. Electronically signed by Deitra Mayo MD on 02/02/2021 at 1:50:40 PM.    Final    US Abdomen Limited RUQ (LIVER/GB)  Result Date: 02/03/2021 CLINICAL DATA:  60 year old female with cirrhosis. Prior cholecystectomy. History of appendiceal carcinoma. EXAM: ULTRASOUND ABDOMEN LIMITED RIGHT UPPER QUADRANT COMPARISON:  CT Abdomen and Pelvis 02/01/2021 and earlier. FINDINGS: Gallbladder: Surgically absent. Common bile  duct: Diameter: 11 mm, versus 8-9 mm on the recent CT but 10-12 mm in January by CT. The CBD appears to taper distally (image 9). Liver: Nodular liver contour redemonstrated. No discrete liver lesion. No intrahepatic biliary ductal dilatation. Portal vein is patent on color Doppler imaging with normal direction of blood flow towards the liver. No evidence on these images that the SMV thrombus has occluded the main portal vein. Other: Negative visible right kidney. IMPRESSION: 1. Cirrhotic liver without discrete liver lesion by ultrasound. 2. Main portal vein remains patent with normal hepatopetal flow direction. 3. CBD enlargement status post cholecystectomy appears not significantly changed and is probably physiologic. Electronically Signed   By: Genevie Ann M.D.   On: 02/03/2021 08:18    Labs:  CBC: Recent Labs    02/01/21 0600 02/02/21 0515 02/03/21 0215 02/04/21 0545  WBC 9.4 7.9 8.8 8.5  HGB 10.4* 10.2* 10.9* 10.5*  HCT 33.1* 33.4* 34.7* 33.1*  PLT 394 251 385 351    COAGS: Recent Labs    02/02/21 0515  INR 1.2    BMP: Recent Labs    03/19/20 1109 05/01/20 1156 07/27/20 0922 10/28/20 0956 02/01/21 0600 02/02/21 0515 02/03/21 0215 02/04/21 0545  NA 143 141 143   < > 138 137 139 139  K 3.6 3.7 4.1   < > 3.6 3.5 3.4* 2.9*  CL 105 104 103   < > 107 107 107 107  CO2 30 27 33*   < > 25 23 24 25   GLUCOSE 133* 102* 160*   < > 117* 107* 116* 112*  BUN 16 21* 19   < > 10 6 5* <5*  CALCIUM 10.2 10.5* 10.4*   < > 8.4* 8.2* 8.4* 8.5*  CREATININE 0.78 1.05* 0.83   < > 0.59 0.45 0.61 0.47  GFRNONAA >60 59* >60   < > >60 >60 >60 >60  GFRAA >60 >60 >60  --   --   --   --   --    < > = values in this interval not displayed.    LIVER FUNCTION TESTS: Recent Labs    01/31/21 0607 02/01/21 0600 02/02/21 0515 02/03/21 0215  BILITOT 0.7 0.6 0.7 0.6  AST 35 26 30 31   ALT 14 13 13 14   ALKPHOS 269* 277* 253* 269*  PROT 6.7 6.4* 6.4* 6.6  ALBUMIN 2.4* 2.3* 2.3* 2.4*    Assessment  and Plan:  S/p intraabdominal abscess drain placement with Dr. [name] on 01/27/21 S/p repositioning of the existing drain with Dr. Laurence Ferrari on 02/02/21    Patient stable.  Drain site and dressing clean and dry, no sign of infection. 10 cc purulent fluid noted in the gravity bag this morning, overnight OP not documented in EPIC. Patient reports her RN is flushing the drain, and emptied the bag this morning. Will inform RN to document OP daily.  CBC stable. Afebrile.   Continue with flushing TID, output recording q shift and dressing changes as needed. Would consider additional imaging when output is less than 10 ml for 24 hours not including flush material.  Further treatment plan per TRH/Oncology Appreciate and agree with the treatment plan.  IR to follow.   Electronically Signed: Tera Mater, PA-C 02/04/2021, 9:40 AM   I spent a total of 15 Minutes at the the patient's bedside AND on the patient's hospital floor or unit, greater than 50% of which was counseling/coordinating care for intraabdominal abscess drain

## 2021-02-05 DIAGNOSIS — T8149XA Infection following a procedure, other surgical site, initial encounter: Secondary | ICD-10-CM | POA: Diagnosis not present

## 2021-02-05 LAB — BASIC METABOLIC PANEL
Anion gap: 6 (ref 5–15)
BUN: <5 mg/dL — ABNORMAL LOW (ref 6–20)
CO2: 28 mmol/L (ref 22–32)
Calcium: 8.6 mg/dL — ABNORMAL LOW (ref 8.9–10.3)
Chloride: 106 mmol/L (ref 98–111)
Creatinine, Ser: 0.56 mg/dL (ref 0.44–1.00)
GFR, Estimated: >60 mL/min (ref >60)
Glucose, Bld: 109 mg/dL — ABNORMAL HIGH (ref 70–99)
Potassium: 3.7 mmol/L (ref 3.5–5.1)
Sodium: 140 mmol/L (ref 135–145)

## 2021-02-05 NOTE — Progress Notes (Signed)
PROGRESS NOTE  Wendy Barber  DOB: January 20, 1961  PCP: Glendon Axe, MD JHE:[id number]  DOA: 01/16/2021  LOS: 20 days   Chief Complaint  Patient presents with  . Diarrhea  . Wound Check    Brief narrative: Wendy Barber is a 60 y.o. female with PMH significant for metastatic appendiceal cancer with extensive surgical history as below, also with medical history of autoimmune hepatitis, liver cirrhosis, DVT and PE with IVC filter chronically on Lovenox, hypertension, diabetes mellitus type 2.. 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  She was in remission for 17 years.  However, in February 2022, patient had a remission.  She followed up at [facility] with Dr. Sharon Mt and underwent another debulking and administration of intraperitoneal chemotherapy.  Unfortunately, she had complications from this. She had an anastomotic leak that required another surgery.  She then had a open abdominal wound that required another surgery. Finally, she was able to come back to [place] in few weeks.  By this he started having persistent diarrhea leading to generalized weakness.  She also noted open discharge from the incision wound and hence presented to our ED on 3/26.   In the ED, she tested C. difficile positive. CT abdomen pelvis showed gas and fluid collection within the anterior abdominal wall, ventral surgical wound measuring 3.2 x 1.8 x 13 cm concerning for wound infection.  There was also a loculated fluid collection in the pelvis just superior and anterior to the urinary bladder measuring 5.5 x 2.6 x 3.8 cm, question sterile versus infected postoperative collection.  Patient was admitted to hospitalist service. General surgery, IR, medical oncology consultations were obtained.   Drain placed by IR for  intra-abdominal abscess.  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 See below for details  02/05/2021: Patient seen.  Diarrhea is slowly improving.  Patient reported 3 episodes of semiformed stool today.  Subjective: -Diarrhea is improving. -No fever or chills. -No abdominal pain.  Assessment/Plan: Postsurgical abdominal wound infection Abdominal wall abscess s/p drain placement by IR on 01/27/2021 Enterocutaneous fistula - 4/12 -Patient presented with concerns for abdominal postsurgical wound infection. -4/6, IR placed a 10 French drain in her abscess and yielded 65 cc of amber-colored fluid. Her drain was connected to gravity bag.   -Culture from the drain grew Klebsiella pneumoniae.  -Per ID recommendation, patient is on cefdinir 300 mg p.o. twice daily and Flagyl 5 mg p.o. twice daily for 6 weeks until 5/20.  -4/12 repeat CT scan of abdomen suggested the presence of enterocutaneous fistula.  Per general surgery and IR, EC fistula is related to previous surgery done in [place] in February.  Recommendation is to continue the current drain and follow-up with general surgeons at [facility] for further evaluation of enterocutaneous fistula.  I discussed the case with IR Dr. Anselm Pancoast again this morning.  Repeat CT abdomen and pelvis was suggested to follow-up on the effectiveness of drain.  C. difficile colitis -Diarrhea improving.  Per ID recommendation, currently on prolonged course of oral vancomycin because of use of IV antibiotics.  Recommended to continue oral vancomycin for the duration of antibiotics +1 more week.   -Continue probiotics, low-dose Lomotil. -4/2, A dose of Dificid was given 02/05/2021: Slowly improving.  Intractable abdominal pain -Due to colitis, intra-abdominal abscesses, EC fistula, intra-abdominal cancer -Currently being controlled with fentanyl patch, IV Dilaudid, oral Dilaudid. -I discussed with patient  to cut back on her opioids.  We will stop IV  Dilaudid at this time and continue with fentanyl patch and as needed oral Dilaudid  History of metastatic appendiceal cancer, pseudomyxoma peritonei and peritoneal carcinomatosis status post CRS/HI PEC and splenectomy in 2005 and recently CRS/HI PEC on 12/10/2020 for recurrent cancer -Patient sees Dr. [name] here in town but most of her cancer care was done at [facility].  -Will need to follow up at D/C with Surgeon in [facility] or Specialist at [facility]  Acute DVT of right common femoral vein, right popliteal vein, right posterior tibial vein and right peroneal veins, supra mesenteric vein, portal vein and iliac vein - 4/12 History of DVT and PE with IVC filter  -Was on prophylactic dose of Lovenox at home.  Switch to heparin drip.  Subsequently switched to oral Xarelto today.  Orthostatic hypotension EssentialHypertension -Patient is having orthostatic hypotension with dizziness despite of elevated baseline blood pressure in 140s and 150s.  This morning, blood pressure dropped from 143/79  to 111/78 on standing up.   -For now we will continue propranolol 20 mg twice daily but keep amlodipine on hold. -Continue to monitor blood pressure.  History of autoimmune Hepatitis, liver cirrhosis -Patient was on azathioprine for a year before it was stopped in February 2022 prior to the surgery.  Continue to hold.  I would defer to her surgeons in [place] to determine on resuming it once the intra-abdominal infection is controlled. -LFTs currently normal.  Essential Tremor -Continue Propranolol 20 mg po BID  Anxiety -C/w Buspirone 5 mg p.o. daily with breakfast, 5 mg p.o. daily, and then 10 mg p.o. nightly. -May need Psych to see given Depression due to situation  Type 2 Diabetes Mellitus  -A1c 5.8 on 3/26. -Diet controlled.  Hypokalemia -Potassium level significantly low at 2.9 today.  Oral and IV replacement ordered.  Recheck tomorrow. 02/05/2021: Potassium today 3.7. Recent Labs   Lab 01/30/21 0531 01/31/21 0607 02/01/21 0600 02/02/21 0515 02/03/21 0215 02/04/21 0545 02/05/21 0538  K 4.3 3.8 3.6 3.5 3.4* 2.9* 3.7  MG 2.0 1.9 1.8 1.9 1.8  --   --   PHOS 3.7 4.0 3.6 3.3 3.3  --   --    Chronic normocytic anemia -Hemoglobin stable between 10-11.    Mobility: Encourage ambulation.  PT eval obtained. No f/u recommended. Code Status:   Code Status: Full Code  Nutritional status: Body mass index is 37.11 kg/m. Nutrition Problem: Increased nutrient needs Etiology: cancer and cancer related treatments Signs/Symptoms: estimated needs Diet Order            Diet Heart Room service appropriate? Yes; Fluid consistency: Thin  Diet effective now                 DVT prophylaxis: Place TED hose Start: 01/31/21 1856   Antimicrobials:  Metronidazole IV, cefdinir oral, vancomycin oral Fluid: Normal saline 75 mL/h for orthostatic hypotension Consultants: Oncology, ID, surgery, IR Family Communication:  None at bedside.  Spoke to husband on the phone  Status is: Inpatient  Remains inpatient appropriate because: Pending CT scan of abdomen and pelvis to evaluate for drain management.  If no new significant finding on CT abdomen and if pain tolerable on oral medications, can be discharged home in next 1 to 2 days.  Dispo: The patient is from: Home              Anticipated d/c is to: Home with home health RN  Patient currently is not medically stable to d/c.   Difficult to place patient No       Infusions:  . metronidazole 500 mg (02/05/21 1009)    Scheduled Meds: . busPIRone  5 mg Oral Q breakfast   And  . busPIRone  5 mg Oral Q1200   And  . busPIRone  10 mg Oral QHS  . cefdinir  300 mg Oral Q12H  . famotidine  40 mg Oral Daily  . fentaNYL  1 patch Transdermal Q72H  . Gerhardt's butt cream   Topical QID  . multivitamin with minerals  1 tablet Oral Daily  . polycarbophil  625 mg Oral Daily  . propranolol  20 mg Oral BID  . Rivaroxaban   15 mg Oral BID WC   Followed by  . [START ON 02/25/2021] rivaroxaban  20 mg Oral Q supper  . saccharomyces boulardii  250 mg Oral BID  . scopolamine  1 patch Transdermal Q72H  . sodium chloride flush  3 mL Intravenous Q12H  . sodium chloride flush  5 mL Intracatheter Q8H  . vancomycin  125 mg Oral Q12H    Antimicrobials: Anti-infectives (From admission, onward)   Start     Dose/Rate Route Frequency Ordered Stop   02/01/21 2200  metroNIDAZOLE (FLAGYL) IVPB 500 mg        500 mg 100 mL/hr over 60 Minutes Intravenous Every 12 hours 02/01/21 1804     01/30/21 1000  vancomycin (VANCOCIN) 50 mg/mL oral solution 125 mg        125 mg Oral Every 12 hours 01/29/21 1431     01/29/21 2200  metroNIDAZOLE (FLAGYL) tablet 500 mg  Status:  Discontinued        500 mg Oral Every 12 hours 01/29/21 1700 02/01/21 1804   01/29/21 1800  metroNIDAZOLE (FLAGYL) tablet 500 mg  Status:  Discontinued        500 mg Oral Every 8 hours 01/29/21 1431 01/29/21 1700   01/29/21 1530  cefdinir (OMNICEF) capsule 300 mg        300 mg Oral Every 12 hours 01/29/21 1431     01/28/21 1800  metroNIDAZOLE (FLAGYL) IVPB 500 mg  Status:  Discontinued        500 mg 100 mL/hr over 60 Minutes Intravenous Every 8 hours 01/28/21 1517 01/29/21 1431   01/28/21 1600  ciprofloxacin (CIPRO) IVPB 400 mg  Status:  Discontinued        400 mg 200 mL/hr over 60 Minutes Intravenous Every 12 hours 01/28/21 1517 01/29/21 1431   01/23/21 1315  vancomycin (VANCOCIN) 50 mg/mL oral solution 125 mg        125 mg Oral Every 6 hours 01/23/21 1226 01/29/21 2359   01/23/21 1000  fidaxomicin (DIFICID) tablet 200 mg  Status:  Discontinued        200 mg Oral 2 times daily 01/23/21 0859 01/23/21 1226   01/17/21 0600  vancomycin (VANCOREADY) IVPB 1500 mg/300 mL  Status:  Discontinued        1,500 mg 150 mL/hr over 120 Minutes Intravenous Every 24 hours 01/16/21 1732 01/17/21 1216   01/16/21 2200  metroNIDAZOLE (FLAGYL) IVPB 500 mg  Status:  Discontinued         500 mg 100 mL/hr over 60 Minutes Intravenous Every 8 hours 01/16/21 1622 01/17/21 1216   01/16/21 2000  vancomycin (VANCOCIN) 50 mg/mL oral solution 125 mg  Status:  Discontinued        125 mg Oral 4  times daily 01/16/21 1805 01/23/21 0859   01/16/21 1800  ceFEPIme (MAXIPIME) 2 g in sodium chloride 0.9 % 100 mL IVPB  Status:  Discontinued        2 g 200 mL/hr over 30 Minutes Intravenous Every 8 hours 01/16/21 1728 01/17/21 1216   01/16/21 1545  vancomycin (VANCOCIN) 50 mg/mL oral solution 125 mg  Status:  Discontinued        125 mg Oral 4 times daily 01/16/21 1539 01/16/21 1622   01/16/21 1445  metroNIDAZOLE (FLAGYL) IVPB 500 mg        500 mg 100 mL/hr over 60 Minutes Intravenous  Once 01/16/21 1438 01/16/21 1622   01/16/21 1330  vancomycin (VANCOCIN) IVPB 1000 mg/200 mL premix        1,000 mg 200 mL/hr over 60 Minutes Intravenous  Once 01/16/21 1320 01/16/21 1538      PRN meds: acetaminophen, calcium carbonate, diclofenac Sodium, hydrALAZINE, HYDROmorphone, ondansetron **OR** ondansetron (ZOFRAN) IV, simethicone   Objective: Vitals:   02/05/21 0549 02/05/21 1355  BP: 131/71 131/85  Pulse: 68 70  Resp: 18 15  Temp: 98.4 F (36.9 C) 98.3 F (36.8 C)  SpO2: 92% 94%    Intake/Output Summary (Last 24 hours) at 02/05/2021 1848 Last data filed at 02/05/2021 1034 Gross per 24 hour  Intake 1471.03 ml  Output --  Net 1471.03 ml   Filed Weights   01/16/21 1700 02/04/21 0618 02/05/21 0700  Weight: 102.1 kg 103.9 kg 104.3 kg   Weight change: 0.4 kg Body mass index is 37.11 kg/m.   Physical Exam: General exam: Pleasant is not in any distress.   HEENT: Patient has pallor.  No jaundice.   Lungs: Clear to auscultation bilaterally CVS: S1-S2.   GI/Abd soft, patient has intra-abdominal drain in place CNS: Alert, awake, oriented x3.  Patient moves all extremities. Extremities: No pedal edema.  Data Review: I have personally reviewed the laboratory data and studies  available.  Recent Labs  Lab 01/30/21 0531 01/31/21 0607 02/01/21 0600 02/02/21 0515 02/03/21 0215 02/04/21 0545  WBC 7.8 8.8 9.4 7.9 8.8 8.5  NEUTROABS 2.2 2.9 2.9 2.6 3.2  --   HGB 10.7* 10.6* 10.4* 10.2* 10.9* 10.5*  HCT 34.7* 35.2* 33.1* 33.4* 34.7* 33.1*  MCV 99.4 101.4* 100.0 100.0 99.1 97.6  PLT 417* 415* 394 251 385 351   Recent Labs  Lab 01/30/21 0531 01/31/21 0607 02/01/21 0600 02/02/21 0515 02/03/21 0215 02/04/21 0545 02/05/21 0538  NA 141 137 138 137 139 139 140  K 4.3 3.8 3.6 3.5 3.4* 2.9* 3.7  CL 104 103 107 107 107 107 106  CO2 28 25 25 23 24 25 28   GLUCOSE 116* 118* 117* 107* 116* 112* 109*  BUN 9 11 10 6  5* <5* <5*  CREATININE 0.53 0.64 0.59 0.45 0.61 0.47 0.56  CALCIUM 9.0 8.7* 8.4* 8.2* 8.4* 8.5* 8.6*  MG 2.0 1.9 1.8 1.9 1.8  --   --   PHOS 3.7 4.0 3.6 3.3 3.3  --   --     F/u labs ordered Unresulted Labs (From admission, onward)          Start     Ordered   02/04/21 6761  Basic metabolic panel  Daily,   R     Question:  Specimen collection method  Answer:  Lab=Lab collect   02/03/21 1745   01/16/21 1201  CBC with Differential/Platelet  Once,   R        01/16/21 1201  Signed, Bonnell Public, MD [facility] 02/05/2021

## 2021-02-05 NOTE — Progress Notes (Signed)
Physical Therapy Treatment Patient Details Name: Wendy Barber MRN: [id number] DOB: May 27, 1961 Today's Date: 02/05/2021    History of Present Illness The patient is a 60 year old Caucasian female admitted with C. difficile colitis and for concern for postop wound infection.  Pt with hx of DVT and IVC filter placement, now with acute DVT (SMV, PVT, Iliac thrombus).  PMHx significant for but not limited to metastatic appendiceal cancer, pseudomyxoma peritonei and peritoneal carcinomatosis status post CRS/HIPEC and splenectomy in 2005 and recent CRS/HIPEC on 12/10/2020 for recurrent cancer, autoimmune hepatitis, DVT and PE with IVC filter currently on Lovenox, hypertension, diabetes mellitus type 2    PT Comments    Patient progressing well with acute PT. She denied dizziness with gait today and was able to ambulate short distance in hallway. HHA provided to steady initially with pt taking several steps without UE support. Pt continues to ambulate at slow cautious pace but no overt LOB noted. Pt c/o pain at Rt hip today and slight slight antalgic gait pattern observed at end. Eucated pt on ankle pumps for circulation and seated exercise for LE strength. Acute PT will continue to progress as pt is able, anticipate she will progress well and no follow up needed.    Follow Up Recommendations  No PT follow up     Equipment Recommendations  None recommended by PT    Recommendations for Other Services       Precautions / Restrictions Precautions Precautions: Other (comment);Fall Precaution Comments: low abd wound & drain; reports history of unexplained syncope causing passing out (approx 4 x a year), orthostatic hypotension Restrictions Weight Bearing Restrictions: No    Mobility  Bed Mobility               General bed mobility comments: pt OOB in recliner    Transfers Overall transfer level: Needs assistance Equipment used: None Transfers: Sit to/from Stand Sit to Stand: Min  guard;Supervision         General transfer comment: min guard/sup for safety  Ambulation/Gait Ambulation/Gait assistance: Min guard   Assistive device: None;1 person hand held assist Gait Pattern/deviations: Step-through pattern;Decreased stride length Gait velocity: decr   General Gait Details: HHA to steady as pt was reaching out for external support.pt ambulated into bathroom initaitally and denied dizziness so progressed to short distance in hallway with both HHA and no UE support. pt denied dizziness throughout gait.   Stairs             Wheelchair Mobility    Modified Rankin (Stroke Patients Only)       Balance Overall balance assessment: Mild deficits observed, not formally tested                                          Cognition Arousal/Alertness: Awake/alert Behavior During Therapy: WFL for tasks assessed/performed Overall Cognitive Status: Within Functional Limits for tasks assessed                                        Exercises Other Exercises Other Exercises: 5 reps LAQ bil LE Other Exercises: 10 reps bil LE anke pumps for circulation    General Comments        Pertinent Vitals/Pain Pain Assessment: 0-10 Pain Score: 5  Pain Location: wound site Pain Descriptors / Indicators:  Grimacing Pain Intervention(s): Limited activity within patient's tolerance;Monitored during session;Repositioned    Home Living                      Prior Function            PT Goals (current goals can now be found in the care plan section) Acute Rehab PT Goals Patient Stated Goal: improve strength and endurance PT Goal Formulation: With patient Time For Goal Achievement: 02/17/21 Potential to Achieve Goals: Good Progress towards PT goals: Progressing toward goals    Frequency    Min 3X/week      PT Plan Current plan remains appropriate    Co-evaluation              AM-PAC PT "6 Clicks" Mobility    Outcome Measure  Help needed turning from your back to your side while in a flat bed without using bedrails?: A Little Help needed moving from lying on your back to sitting on the side of a flat bed without using bedrails?: A Little Help needed moving to and from a bed to a chair (including a wheelchair)?: A Little Help needed standing up from a chair using your arms (e.g., wheelchair or bedside chair)?: A Little Help needed to walk in hospital room?: A Little Help needed climbing 3-5 steps with a railing? : A Little 6 Click Score: 18    End of Session Equipment Utilized During Treatment: Gait belt Activity Tolerance: Patient tolerated treatment well Patient left: in chair;with call bell/phone within reach Nurse Communication: Mobility status PT Visit Diagnosis: Difficulty in walking, not elsewhere classified (R26.2)     Time: 1700-1716 PT Time Calculation (min) (ACUTE ONLY): 16 min  Charges:  $Gait Training: 8-22 mins                     Verner Mould, DPT Acute Rehabilitation Services Office [phone] Pager [phone]     Jacques Navy 02/05/2021, 5:36 PM

## 2021-02-05 NOTE — Progress Notes (Signed)
Orthostatic Vital Signs  Supine- 5 min: 131/71 HR 69 Standing:           104/78  HR 86 Standing 1 min: 117/90  HR 92 Standing 3 min: 105/67  HR 98

## 2021-02-05 NOTE — Progress Notes (Signed)
Overall, everything is about the same.  She still having some diarrhea.  She I think will always have some element of diarrhea because of her past surgeries.  She had a CT scan done yesterday of the abdomen and pelvis.  This showed improvement in the abscess.  She is frustrated that she may have to go back up to [place] because of this fistula.  She really would like to have this taken care of down here.  However, surgery will not do anything with this fistula because this was caused by her surgery that she had done in [place].  I told her that the surgeons really want to have the original surgeon be able to manage any complications.  She is now on Xarelto.  She has a thrombus in the right leg.  She says that when she stands, there is some pain in the right leg.  She really needs to have a thigh-high compression stocking.  Pain I think will be a chronic problem for her.  She still is having some issues with the abdomen.  I does think this is always going to be another problem for her.  Apparently, the pain doctors that she is seeing really will not manage what is going on now.  Her appetite is okay.  She still has little bit of dizziness when she stands up.  Hopefully, she will be able to go home soon.  Is really worried about going home then having to come back to the hospital because of a problem.  Maybe, she will be fortunate to be able to go home over the weekend.  I know that she is had great care from all staff on 6 E.  I appreciate all of their compassion.  I know this is been incredibly complicated.  [name], MD  [name] 24:7

## 2021-02-05 NOTE — Progress Notes (Signed)
Referring Physician(s): Ennever,P  Supervising Physician: Ruthann Cancer  Patient Status:  Wendy Barber - In-pt  Chief Complaint:  Abdominal pain/abscess, nausea, loose stools, occasional dizziness  Subjective: Patient with slightly less abdominal pain today; continues to have some intermittent nausea, loose stools and dizziness upon arising from bed   Allergies: Penicillins, Alprazolam, Ativan [lorazepam], Corticosteroids, Erythromycin, Erythromycin base, Milnacipran hcl, Prednisone, Savella  [milnacipran], Povidone iodine, and Prednisolone  Medications: Prior to Admission medications   Medication Sig Start Date End Date Taking? Authorizing Provider  busPIRone (BUSPAR) 5 MG tablet Take 5 mg by mouth See admin instructions. Takes 1 tablet in the morning, 1 tablet in the afternoon and 2 tablets at night   Yes [provider]  diphenoxylate-atropine (LOMOTIL) 2.5-0.025 MG tablet Take 1 tablet by mouth daily as needed for diarrhea or loose stools. 01/15/21  Yes [provider]  enoxaparin (LOVENOX) 40 MG/0.4ML injection Inject 40 mg into the skin daily. 01/01/21  Yes [provider]  HYDROmorphone (DILAUDID) 2 MG tablet Take 2 mg by mouth every 4 (four) hours as needed for moderate pain. 01/08/21  Yes [provider]  loperamide (IMODIUM) 2 MG capsule Take 4 mg by mouth in the morning, at noon, in the evening, and at bedtime. 01/01/21  Yes [provider]  ondansetron (ZOFRAN) 8 MG tablet Take 8 mg by mouth every 8 (eight) hours as needed for nausea or vomiting. 02/26/20  Yes [provider]  pantoprazole (PROTONIX) 40 MG tablet Take 40 mg by mouth every morning. 01/01/21  Yes [provider]  diazepam (VALIUM) 5 MG tablet Take 40 minutes prior to MRI Patient not taking: No sig reported 07/16/20   Volanda Napoleon, MD  insulin glargine (LANTUS) 100 UNIT/ML injection Inject 0.1 mLs (10 Units total) into the skin at bedtime. Before  bed Patient not taking: No sig reported 11/26/19   Nita Sells, MD  insulin lispro (HUMALOG) 100 UNIT/ML injection Inject 0.08 mLs (8 Units total) into the skin 3 (three) times daily before meals. Patient not taking: No sig reported 11/26/19   Nita Sells, MD     Vital Signs: BP 131/71   Pulse 68   Temp 98.4 F (36.9 C) (Oral)   Resp 18   Ht 5\' 6"  (1.676 m)   Wt 229 lb 15 oz (104.3 kg)   SpO2 92%   BMI 37.11 kg/m   Physical Exam awake, appears fatigued, lower mid abdominal drain intact, insertion site okay, mildly tender to palpation, output 30 cc yellow fluid with some tissue fragments  Imaging: CT ABDOMEN PELVIS W CONTRAST  Result Date: 02/04/2021 CLINICAL DATA:  Intra-abdominal abscess, status post percutaneous drain, enterocutaneous fistula EXAM: CT ABDOMEN AND PELVIS WITH CONTRAST TECHNIQUE: Multidetector CT imaging of the abdomen and pelvis was performed using the standard protocol following bolus administration of intravenous contrast. CONTRAST:  17mL OMNIPAQUE IOHEXOL 300 MG/ML  SOLN COMPARISON:  02/01/2021 FINDINGS: Lower chest: Minor right basilar atelectasis versus scarring. Normal heart size. No pericardial or pleural effusion. No acute lower chest finding. Hepatobiliary: Similar hepatic cirrhotic changes with surface nodularity over dome liver. No large focal hepatic abnormality biliary dilatation. Remote cholecystectomy. Common bile duct nondilated. Pancreas: Diffuse pancreatic thinning/atrophy. No acute process or surrounding inflammation. No ductal dilatation. Spleen: Remote splenectomy. Adrenals/Urinary Tract: Normal adrenal glands. No acute renal abnormality or focal lesion. No obstruction, hydronephrosis, hydroureter or ureteral calculus. Bladder unremarkable. Stomach/Bowel: Similar extensive postoperative changes of the bowel with multiple loops of bowel in close proximity to  the anterior abdominal wall but without obstruction pattern, significant  dilatation, ileus or free air. Interval reposition of the drain catheter now within the inferior aspect of the subcutaneous abdominal wall fluid collection. Compared to the prior study, the fluid collection has improved with only a minimal amount of fluid and air along the presumed known enterocutaneous fistula. No new abdominal or pelvic fluid collection, abscess, ascites, hemorrhage, or hematoma. Vascular/Lymphatic: Tortuosity of the aorta and iliac vessels without aneurysm or occlusive process. Mesenteric and renal arterial vasculature all appear patent. Chronic IVC filter with filter legs penetrating the IVC as before. Similar hypodense filling defect within the main portal vein extending into the SMV compatible with portal mesenteric venous thrombus. Intrahepatic portal branches appear small but patent. No interval propagation of clot. Reproductive: Status post hysterectomy. No adnexal masses. Other: No abdominal wall hernia or abnormality. No abdominopelvic ascites. Musculoskeletal: Postop fusion of the lumbar spine. Associated degenerative changes as well. No acute osseous finding. Mild associated scoliosis. IMPRESSION: Interval reposition of the anterior abdominal wall/fistula drain now within the subcutaneous lower abdominal wall with overall improvement of the residual abdominal wall fluid collection. Only trace amount of fluid and air along the known enterocutaneous fistula. No new fluid collection or abscess that warrants drainage. Chronic hepatic cirrhosis. Similar nonocclusive thrombus within the main portal vein and superior mesenteric vein. No interval propagation compared to 02/01/2021. Other chronic and postsurgical findings are stable. Electronically Signed   By: Jerilynn Mages.  Shick M.D.   On: 02/04/2021 16:42   CT ABDOMEN PELVIS W CONTRAST  Result Date: 02/02/2021 CLINICAL DATA:  Follow-up abdominal abscess. Status post drain placement on 01/27/2021. History of appendiceal cancer. EXAM: CT ABDOMEN AND  PELVIS WITH CONTRAST TECHNIQUE: Multidetector CT imaging of the abdomen and pelvis was performed using the standard protocol following bolus administration of intravenous contrast. CONTRAST:  147mL OMNIPAQUE IOHEXOL 300 MG/ML  SOLN COMPARISON:  CT abdomen 01/26/2021 FINDINGS: Lower chest: No pleural effusions. Scattered pleural-based densities are suggestive for atelectasis or mild scarring. No significant airspace disease or consolidation at the lung bases. Hepatobiliary: Liver has a nodular contour and similar to the previous examination. Findings are suspicious for cirrhosis. The gallbladder has been removed. Intrahepatic portal venous system is patent. No focal liver lesion. Pancreas: Unremarkable. No pancreatic ductal dilatation or surrounding inflammatory changes. Common bile duct measures 8 mm and stable. Spleen: Surgically removed. Adrenals/Urinary Tract: Normal appearance of the adrenal glands. Normal appearance of both kidneys without hydronephrosis. Mild fullness of the renal pelvis bilaterally. Normal appearance of the urinary bladder. Stomach/Bowel: Extensive postsurgical bowel changes with bowel anastomotic clips involving the rectosigmoid region and multiple bowel surgical clips in the right lower quadrant of the abdomen. Multiple loops of bowel are in close proximity along the anterior abdominal wall associated with the abdominal wall fluid collection and percutaneous drain. No significant bowel dilatation and no evidence for bowel obstruction. Diffuse mesenteric edema without focal bowel inflammation. Vascular/Lymphatic: Minimal atherosclerotic disease in the abdominal aorta without aneurysm. Main visceral arteries are patent. Again noted is an IVC filter in the infrarenal IVC with filter legs penetrating through the IVC. Again noted is a filling defect involving the superior mesenteric vein and a branch extending into the right abdomen. There is concern that this filling defect is now extending  into the main portal vein on sequence 4, image 40. In addition, there is concern for thrombus involving the right common femoral vein and possibly the right external iliac vein but difficult to differentiate thrombus from mixing  of blood and contrast in these area. Reproductive: Status post hysterectomy. No adnexal masses. Other: A percutaneous drain has been placed at the midline and it is extending through the anterior abdominal wall fluid collection. This drain terminates in the midline of the abdomen near loops of bowel. The drain is positioned near surgical bowel clips. There is a low-density collection near the drain which measures up to 3.2 cm on sequence 2, image 53 and not clear if this is a bowel loop or extraluminal collection. There continues to be a large amount of fluid within the anterior abdominal wall which has minimally decreased since placement of the drain. The fluid collection on the sagittal images on sequence 5 image 62 measures 10.1 x 2.8 cm and previously measured 10.5 x 3.3 cm. There is a small amount of gas within the superior aspect of the collection. No new abscess collections. Musculoskeletal: Bilateral pedicle screw and rod fixation at L4-L5. Interbody device at L4-L5. Again noted is severe disc space loss at L3-L4. No acute bone abnormality. IMPRESSION: 1. Anterior abdominal wall fluid collection has minimally decreased in size since placement of the percutaneous drain. Percutaneous drain extends through the collection and terminates in the midline of the abdomen near loops of bowel. Fluid collection near the tip of the catheter within the abdomen could represent a loop of bowel versus extraluminal fluid collection. This collection may be further evaluated with a drain injection. 2. Concern for propagation of clot in the superior mesenteric vein extending into the main portal vein. In addition, there are is concern for DVT in the right groin extending into the right external iliac  vein. Consider further evaluation of the lower extremities with venous duplex imaging. 3. Persistent and slightly increased mesenteric edema. Mesenteric edema is likely related to the postsurgical changes, intra-abdominal / abdominal wall abscess and probably the mesenteric venous thrombus. 4. Liver has a nodular contour and suspicious for cirrhosis. These results were called by telephone at the time of interpretation on 02/02/2021 at 8:02 am to provider [facility], DO , who verbally acknowledged these results. Electronically Signed   By: Markus Daft M.D.   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 CT-guided drain placed on 01/27/2021. Recent CT imaging demonstrates persistent fluid and gas collection within the anterior abdominal wall. The percutaneous drain is located intraperitoneal adjacent to the small bowel likely at the site of the fistula. Patient presents for repositioning into the anterior abdominal collection. EXAM: Drain exchange MEDICATIONS: The patient is currently admitted to the hospital and receiving intravenous antibiotics. The antibiotics were administered within an appropriate time frame prior to the initiation of the procedure. ANESTHESIA/SEDATION: Fentanyl 100 mcg IV; Versed 3 mg IV Moderate Sedation Time:  12 minutes The patient was continuously monitored during the procedure by the interventional radiology nurse under my direct supervision. COMPLICATIONS: None immediate. PROCEDURE: Informed written consent was obtained from the patient after a thorough discussion of the procedural risks, benefits and alternatives. All questions were addressed. Maximal Sterile Barrier Technique was utilized including caps, mask, sterile gowns, sterile gloves, sterile drape,  hand hygiene and skin antiseptic. A timeout was performed prior to the initiation of the procedure. Contrast was injected through the existing catheter. There is direct communication between the catheter in the adjacent small bowel. The catheter was slowly pulled back  with additional injection performed. Contrast opacifies a large fluid collection within the anterior abdominal wall. The catheter was cut and removed over a Bentson wire. A 5 French angled catheter was advanced over the wire and used to direct the wire into the more dependent portion of the abdominal wall fluid collection. A new Cook 95 [place] all-purpose drainage catheter was then advanced over the wire and formed in the fluid collection. Aspiration yields approximately 60 mL of intestinal succus. The catheter was secured to the skin with 0 Prolene suture. IMPRESSION: 1. Contrast injection confirms enterocutaneous fistula. 2. Successful reposition and upsize of existing catheter to a new 2 French catheter which is positioned in the dependent aspect of the intra-abdominal fluid collection. Electronically Signed   By: Jacqulynn Cadet M.D.   On: 02/02/2021 16:25   VAS [place] LOWER EXTREMITY VENOUS (DVT)  Result Date: 02/02/2021  Lower Venous DVT Study Indications: Edema.  Risk Factors: None identified. Limitations: Body habitus and poor ultrasound/tissue interface. Comparison Study: No prior studies. Performing Technologist: Oliver Hum RVT  Examination Guidelines: A complete evaluation includes B-mode imaging, spectral Doppler, color Doppler, and power Doppler as needed of all accessible portions of each vessel. Bilateral testing is considered an integral part of a complete examination. Limited examinations for reoccurring indications may be performed as noted. The reflux portion of the exam is performed with the patient in reverse Trendelenburg.  +---------+---------------+---------+-----------+----------+--------------+ RIGHT     CompressibilityPhasicitySpontaneityPropertiesThrombus Aging +---------+---------------+---------+-----------+----------+--------------+ CFV      None           No       No                   Acute          +---------+---------------+---------+-----------+----------+--------------+ SFJ      None                                         Acute          +---------+---------------+---------+-----------+----------+--------------+ FV Prox  Full                                                        +---------+---------------+---------+-----------+----------+--------------+ FV Mid   Full                                                        +---------+---------------+---------+-----------+----------+--------------+ FV DistalFull                                                        +---------+---------------+---------+-----------+----------+--------------+ PFV      Full                                                        +---------+---------------+---------+-----------+----------+--------------+  POP      Partial        Yes      Yes                  Acute          +---------+---------------+---------+-----------+----------+--------------+ PTV      Partial                                      Acute          +---------+---------------+---------+-----------+----------+--------------+ PERO     Partial                                      Acute          +---------+---------------+---------+-----------+----------+--------------+ Gastroc  Full                                                        +---------+---------------+---------+-----------+----------+--------------+ Thrombus located in the CFV is noted to only be in the distal segment.  +---------+---------------+---------+-----------+----------+--------------+ LEFT     CompressibilityPhasicitySpontaneityPropertiesThrombus Aging  +---------+---------------+---------+-----------+----------+--------------+ CFV      Full           Yes      Yes                                 +---------+---------------+---------+-----------+----------+--------------+ SFJ      Full                                                        +---------+---------------+---------+-----------+----------+--------------+ FV Prox  Full                                                        +---------+---------------+---------+-----------+----------+--------------+ FV Mid   Full                                                        +---------+---------------+---------+-----------+----------+--------------+ FV DistalFull                                                        +---------+---------------+---------+-----------+----------+--------------+ PFV      Full                                                        +---------+---------------+---------+-----------+----------+--------------+  POP      Full           Yes      Yes                                 +---------+---------------+---------+-----------+----------+--------------+ PTV      Full                                                        +---------+---------------+---------+-----------+----------+--------------+ PERO     Full                                                        +---------+---------------+---------+-----------+----------+--------------+     Summary: RIGHT: - Findings consistent with acute deep vein thrombosis involving the right common femoral vein, right popliteal vein, right posterior tibial veins, and right peroneal veins. - No cystic structure found in the popliteal fossa.  LEFT: - There is no evidence of deep vein thrombosis in the lower extremity. However, portions of this examination were limited- see technologist comments above.  *See table(s) above for measurements and observations. Electronically signed by Deitra Mayo MD on 02/02/2021 at 1:50:40 PM.    Final    US Abdomen Limited RUQ (LIVER/GB)  Result Date: 02/03/2021 CLINICAL DATA:  60 year old female with cirrhosis. Prior cholecystectomy. History of appendiceal carcinoma. EXAM: ULTRASOUND ABDOMEN LIMITED RIGHT UPPER QUADRANT COMPARISON:  CT Abdomen and Pelvis 02/01/2021 and earlier. FINDINGS: Gallbladder: Surgically absent. Common bile duct: Diameter: 11 mm, versus 8-9 mm on the recent CT but 10-12 mm in January by CT. The CBD appears to taper distally (image 9). Liver: Nodular liver contour redemonstrated. No discrete liver lesion. No intrahepatic biliary ductal dilatation. Portal vein is patent on color Doppler imaging with normal direction of blood flow towards the liver. No evidence on these images that the SMV thrombus has occluded the main portal vein. Other: Negative visible right kidney. IMPRESSION: 1. Cirrhotic liver without discrete liver lesion by ultrasound. 2. Main portal vein remains patent with normal hepatopetal flow direction. 3. CBD enlargement status post cholecystectomy appears not significantly changed and is probably physiologic. Electronically Signed   By: Genevie Ann M.D.   On: 02/03/2021 08:18    Labs:  CBC: Recent Labs    02/01/21 0600 02/02/21 0515 02/03/21 0215 02/04/21 0545  WBC 9.4 7.9 8.8 8.5  HGB 10.4* 10.2* 10.9* 10.5*  HCT 33.1* 33.4* 34.7* 33.1*  PLT 394 251 385 351    COAGS: Recent Labs    02/02/21 0515  INR 1.2    BMP: Recent Labs    03/19/20 1109 05/01/20 1156 07/27/20 0922 10/28/20 0956 02/02/21 0515 02/03/21 0215 02/04/21 0545 02/05/21 0538  NA 143 141 143   < > 137 139 139 140  K 3.6 3.7 4.1   < > 3.5 3.4* 2.9* 3.7  CL 105 104 103   < > 107 107 107 106  CO2 30 27 33*   < > 23 24 25 28   GLUCOSE 133* 102* 160*   < > 107* 116* 112* 109*  BUN 16 21* 19   < >  6 5* <5* <5*  CALCIUM 10.2 10.5* 10.4*   < > 8.2* 8.4* 8.5* 8.6*  CREATININE 0.78 1.05* 0.83   < > 0.45 0.61 0.47 0.56  GFRNONAA >60  59* >60   < > >60 >60 >60 >60  GFRAA >60 >60 >60  --   --   --   --   --    < > = values in this interval not displayed.    LIVER FUNCTION TESTS: Recent Labs    01/31/21 0607 02/01/21 0600 02/02/21 0515 02/03/21 0215  BILITOT 0.7 0.6 0.7 0.6  AST 35 26 30 31   ALT 14 13 13 14   ALKPHOS 269* 277* 253* 269*  PROT 6.7 6.4* 6.4* 6.6  ALBUMIN 2.4* 2.3* 2.3* 2.4*    Assessment and Plan: Patient with history of metastatic appendiceal cancer, pseudomyxoma peritonei and peritoneal carcinomatosis, status post CRS/HI PEC and splenectomy in 2005, recurrent cancer status post HI PEC and debulking on 12/10/2020 in [place];complicated by enterotomy requiring return to the OR on postop day 4 and then again for closure of open midline wound;now with postop abdominal wall fluid collection and deep pelvic collection on CT;hx c diff; status post drainage of midline abdominal fluid collection on 4/6; s/p repositioning and upsizing of abdominal drain to 16 [place] along with drain injection confirming enterocutaneous fistula on 4/12; currently on IV heparin for acute right lower extremity DVT; afebrile, last WBC normal, hemoglobin stable, creatinine normal; limited abdominal ultrasound yesterday revealed cirrhotic liver without lesion, patent main portal vein with normal hepatopetal flow direction, some common bile duct enlargement status post cholecystectomy but not significantly changed and probably physiologic; CT A/P yesterday revealed:  Interval reposition of the anterior abdominal wall/fistula drain now within the subcutaneous lower abdominal wall with overall improvement of the residual abdominal wall fluid collection. Only trace amount of fluid and air along the known enterocutaneous fistula.  No new fluid collection or abscess that warrants drainage.  Chronic hepatic cirrhosis.  Similar nonocclusive thrombus within the main portal vein and superior mesenteric vein. No interval  propagation compared to 02/01/2021.  Other chronic and postsurgical findings are stable  Continue current treatment, drain irrigation, output monitoring, lab checks.  Patient presumably to follow-up with general surgeons in [facility] for further evaluation of EC fistula; will f/u on 4/18 unless clinical status changes   Electronically Signed: D. Rowe Robert, PA-C 02/05/2021, 1:27 PM   I spent a total of 15 minutes at the the patient's bedside AND on the patient's hospital floor or unit, greater than 50% of which was counseling/coordinating care for abdominal abscess drain    Patient ID: Wendy Barber, female   DOB: October 19, 1961, 60 y.o.   MRN: [id number]

## 2021-02-06 DIAGNOSIS — T8149XA Infection following a procedure, other surgical site, initial encounter: Secondary | ICD-10-CM | POA: Diagnosis not present

## 2021-02-06 LAB — BASIC METABOLIC PANEL
Anion gap: 10 (ref 5–15)
BUN: 6 mg/dL (ref 6–20)
CO2: 25 mmol/L (ref 22–32)
Calcium: 8.8 mg/dL — ABNORMAL LOW (ref 8.9–10.3)
Chloride: 106 mmol/L (ref 98–111)
Creatinine, Ser: 0.55 mg/dL (ref 0.44–1.00)
GFR, Estimated: >60 mL/min (ref >60)
Glucose, Bld: 121 mg/dL — ABNORMAL HIGH (ref 70–99)
Potassium: 3.2 mmol/L — ABNORMAL LOW (ref 3.5–5.1)
Sodium: 141 mmol/L (ref 135–145)

## 2021-02-06 NOTE — Progress Notes (Signed)
PROGRESS NOTE  Wendy Barber  DOB: May 01, 1961  PCP: Glendon Axe, MD DZH:[id number]  DOA: 01/16/2021  LOS: 21 days   Chief Complaint  Patient presents with  . Diarrhea  . Wound Check    Brief narrative: Wendy Barber is a 60 y.o. female with PMH significant for metastatic appendiceal cancer with extensive surgical history as below, also with medical history of autoimmune hepatitis, liver cirrhosis, DVT and PE with IVC filter chronically on Lovenox, hypertension, diabetes mellitus type 2.. 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  She was in remission for 17 years.  However, in February 2022, patient had a remission.  She followed up at [facility] with Dr. Sharon Mt and underwent another debulking and administration of intraperitoneal chemotherapy.  Unfortunately, she had complications from this. She had an anastomotic leak that required another surgery.  She then had a open abdominal wound that required another surgery. Finally, she was able to come back to [place] in few weeks.  By this he started having persistent diarrhea leading to generalized weakness.  She also noted open discharge from the incision wound and hence presented to our ED on 3/26.   In the ED, she tested C. difficile positive. CT abdomen pelvis showed gas and fluid collection within the anterior abdominal wall, ventral surgical wound measuring 3.2 x 1.8 x 13 cm concerning for wound infection.  There was also a loculated fluid collection in the pelvis just superior and anterior to the urinary bladder measuring 5.5 x 2.6 x 3.8 cm, question sterile versus infected postoperative collection.  Patient was admitted to hospitalist service. General surgery, IR, medical oncology consultations were obtained.   Drain placed by IR for  intra-abdominal abscess.  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 See below for details  02/05/2021: Patient seen.  Diarrhea is slowly improving.  Patient reported 3 episodes of semiformed stool today. 02/06/2021: Patient seen.  Diarrhea continues to improve.  Subjective: -Diarrhea has improved significantly.  -No fever or chills. -No abdominal pain.  Assessment/Plan: Postsurgical abdominal wound infection Abdominal wall abscess s/p drain placement by IR on 01/27/2021 Enterocutaneous fistula - 4/12 -Patient presented with concerns for abdominal postsurgical wound infection. -4/6, IR placed a 10 French drain in her abscess and yielded 65 cc of amber-colored fluid. Her drain was connected to gravity bag.   -Culture from the drain grew Klebsiella pneumoniae.  -Per ID recommendation, patient is on cefdinir 300 mg p.o. twice daily and Flagyl 5 mg p.o. twice daily for 6 weeks until 5/20.  -4/12 repeat CT scan of abdomen suggested the presence of enterocutaneous fistula.  Per general surgery and IR, EC fistula is related to previous surgery done in [place] in February.  Recommendation is to continue the current drain and follow-up with general surgeons at [facility] for further evaluation of enterocutaneous fistula.  I discussed the case with IR Dr. Anselm Pancoast again this morning.  Repeat CT abdomen and pelvis was suggested to follow-up on the effectiveness of drain.  C. difficile colitis -Diarrhea improving.  Per ID recommendation, currently on prolonged course of oral vancomycin because of use of IV antibiotics.  Recommended to continue oral vancomycin for the duration of antibiotics +1 more week.   -Continue probiotics, low-dose Lomotil. -4/2, A dose of Dificid was given 02/05/2021: Slowly improving. 02/06/2021: Diarrhea has improved significantly.  Intractable abdominal pain -Due to colitis, intra-abdominal abscesses, EC fistula, intra-abdominal  cancer -Currently being controlled  with fentanyl patch, IV Dilaudid, oral Dilaudid. -I discussed with patient to cut back on her opioids.  We will stop IV Dilaudid at this time and continue with fentanyl patch and as needed oral Dilaudid  History of metastatic appendiceal cancer, pseudomyxoma peritonei and peritoneal carcinomatosis status post CRS/HI PEC and splenectomy in 2005 and recently CRS/HI PEC on 12/10/2020 for recurrent cancer -Patient sees Dr. [name] here in town but most of her cancer care was done at [facility].  -Will need to follow up at D/C with Surgeon in [facility] or Specialist at [facility]  Acute DVT of right common femoral vein, right popliteal vein, right posterior tibial vein and right peroneal veins, supra mesenteric vein, portal vein and iliac vein - 4/12 History of DVT and PE with IVC filter  -Was on prophylactic dose of Lovenox at home.  Switch to heparin drip.  Subsequently switched to oral Xarelto today.  Orthostatic hypotension EssentialHypertension -Patient is having orthostatic hypotension with dizziness despite of elevated baseline blood pressure in 140s and 150s.  This morning, blood pressure dropped from 143/79  to 111/78 on standing up.   -For now we will continue propranolol 20 mg twice daily but keep amlodipine on hold. -Continue to monitor blood pressure.  History of autoimmune Hepatitis, liver cirrhosis -Patient was on azathioprine for a year before it was stopped in February 2022 prior to the surgery.  Continue to hold.  I would defer to her surgeons in [place] to determine on resuming it once the intra-abdominal infection is controlled. -LFTs currently normal.  Essential Tremor -Continue Propranolol 20 mg po BID  Anxiety -C/w Buspirone 5 mg p.o. daily with breakfast, 5 mg p.o. daily, and then 10 mg p.o. nightly. -May need Psych to see given Depression due to situation  Type 2 Diabetes Mellitus  -A1c 5.8 on 3/26. -Diet controlled.  Hypokalemia -Potassium level significantly  low at 2.9 today.  Oral and IV replacement ordered.  Recheck tomorrow. 02/05/2021: Potassium today 3.7. 02/06/2021: Potassium is 3.2 today.  Continue to monitor and replete. Recent Labs  Lab 01/31/21 0607 02/01/21 0600 02/02/21 0515 02/03/21 0215 02/04/21 0545 02/05/21 0538 02/06/21 0536  K 3.8 3.6 3.5 3.4* 2.9* 3.7 3.2*  MG 1.9 1.8 1.9 1.8  --   --   --   PHOS 4.0 3.6 3.3 3.3  --   --   --    Chronic normocytic anemia -Hemoglobin stable between 10-11.    Mobility: Encourage ambulation.  PT eval obtained. No f/u recommended. Code Status:   Code Status: Full Code  Nutritional status: Body mass index is 37.11 kg/m. Nutrition Problem: Increased nutrient needs Etiology: cancer and cancer related treatments Signs/Symptoms: estimated needs Diet Order            Diet Heart Room service appropriate? Yes; Fluid consistency: Thin  Diet effective now                 DVT prophylaxis: Place TED hose Start: 01/31/21 1856   Antimicrobials:  Metronidazole IV, cefdinir oral, vancomycin oral Fluid: Normal saline 75 mL/h for orthostatic hypotension Consultants: Oncology, ID, surgery, IR Family Communication:  None at bedside.  Spoke to husband on the phone  Status is: Inpatient  Remains inpatient appropriate because: Pending CT scan of abdomen and pelvis to evaluate for drain management.  If no new significant finding on CT abdomen and if pain tolerable on oral medications, can be discharged home in next 1 to 2 days.  Dispo:  The patient is from: Home              Anticipated d/c is to: Home with home health RN              Patient currently is not medically stable to d/c.   Difficult to place patient No       Infusions:  . metronidazole 500 mg (02/06/21 1010)    Scheduled Meds: . busPIRone  5 mg Oral Q breakfast   And  . busPIRone  5 mg Oral Q1200   And  . busPIRone  10 mg Oral QHS  . cefdinir  300 mg Oral Q12H  . famotidine  40 mg Oral Daily  . fentaNYL  1 patch  Transdermal Q72H  . Gerhardt's butt cream   Topical QID  . multivitamin with minerals  1 tablet Oral Daily  . polycarbophil  625 mg Oral Daily  . propranolol  20 mg Oral BID  . Rivaroxaban  15 mg Oral BID WC   Followed by  . [START ON 02/25/2021] rivaroxaban  20 mg Oral Q supper  . saccharomyces boulardii  250 mg Oral BID  . scopolamine  1 patch Transdermal Q72H  . sodium chloride flush  3 mL Intravenous Q12H  . sodium chloride flush  5 mL Intracatheter Q8H  . vancomycin  125 mg Oral Q12H    Antimicrobials: Anti-infectives (From admission, onward)   Start     Dose/Rate Route Frequency Ordered Stop   02/01/21 2200  metroNIDAZOLE (FLAGYL) IVPB 500 mg        500 mg 100 mL/hr over 60 Minutes Intravenous Every 12 hours 02/01/21 1804     01/30/21 1000  vancomycin (VANCOCIN) 50 mg/mL oral solution 125 mg        125 mg Oral Every 12 hours 01/29/21 1431     01/29/21 2200  metroNIDAZOLE (FLAGYL) tablet 500 mg  Status:  Discontinued        500 mg Oral Every 12 hours 01/29/21 1700 02/01/21 1804   01/29/21 1800  metroNIDAZOLE (FLAGYL) tablet 500 mg  Status:  Discontinued        500 mg Oral Every 8 hours 01/29/21 1431 01/29/21 1700   01/29/21 1530  cefdinir (OMNICEF) capsule 300 mg        300 mg Oral Every 12 hours 01/29/21 1431     01/28/21 1800  metroNIDAZOLE (FLAGYL) IVPB 500 mg  Status:  Discontinued        500 mg 100 mL/hr over 60 Minutes Intravenous Every 8 hours 01/28/21 1517 01/29/21 1431   01/28/21 1600  ciprofloxacin (CIPRO) IVPB 400 mg  Status:  Discontinued        400 mg 200 mL/hr over 60 Minutes Intravenous Every 12 hours 01/28/21 1517 01/29/21 1431   01/23/21 1315  vancomycin (VANCOCIN) 50 mg/mL oral solution 125 mg        125 mg Oral Every 6 hours 01/23/21 1226 01/29/21 2359   01/23/21 1000  fidaxomicin (DIFICID) tablet 200 mg  Status:  Discontinued        200 mg Oral 2 times daily 01/23/21 0859 01/23/21 1226   01/17/21 0600  vancomycin (VANCOREADY) IVPB 1500 mg/300 mL  Status:   Discontinued        1,500 mg 150 mL/hr over 120 Minutes Intravenous Every 24 hours 01/16/21 1732 01/17/21 1216   01/16/21 2200  metroNIDAZOLE (FLAGYL) IVPB 500 mg  Status:  Discontinued        500 mg 100  mL/hr over 60 Minutes Intravenous Every 8 hours 01/16/21 1622 01/17/21 1216   01/16/21 2000  vancomycin (VANCOCIN) 50 mg/mL oral solution 125 mg  Status:  Discontinued        125 mg Oral 4 times daily 01/16/21 1805 01/23/21 0859   01/16/21 1800  ceFEPIme (MAXIPIME) 2 g in sodium chloride 0.9 % 100 mL IVPB  Status:  Discontinued        2 g 200 mL/hr over 30 Minutes Intravenous Every 8 hours 01/16/21 1728 01/17/21 1216   01/16/21 1545  vancomycin (VANCOCIN) 50 mg/mL oral solution 125 mg  Status:  Discontinued        125 mg Oral 4 times daily 01/16/21 1539 01/16/21 1622   01/16/21 1445  metroNIDAZOLE (FLAGYL) IVPB 500 mg        500 mg 100 mL/hr over 60 Minutes Intravenous  Once 01/16/21 1438 01/16/21 1622   01/16/21 1330  vancomycin (VANCOCIN) IVPB 1000 mg/200 mL premix        1,000 mg 200 mL/hr over 60 Minutes Intravenous  Once 01/16/21 1320 01/16/21 1538      PRN meds: acetaminophen, calcium carbonate, diclofenac Sodium, hydrALAZINE, HYDROmorphone, ondansetron **OR** ondansetron (ZOFRAN) IV, simethicone   Objective: Vitals:   02/05/21 2315 02/06/21 0602  BP: 120/68 127/69  Pulse: 68 67  Resp: 17 18  Temp: 98.3 F (36.8 C) 98.1 F (36.7 C)  SpO2: 93% 92%    Intake/Output Summary (Last 24 hours) at 02/06/2021 1605 Last data filed at 02/06/2021 0946 Gross per 24 hour  Intake 240 ml  Output --  Net 240 ml   Filed Weights   01/16/21 1700 02/04/21 0618 02/05/21 0700  Weight: 102.1 kg 103.9 kg 104.3 kg   Weight change:  Body mass index is 37.11 kg/m.   Physical Exam: General exam: Pleasant is not in any distress.   HEENT: Patient has pallor.  No jaundice.   Lungs: Clear to auscultation bilaterally CVS: S1-S2.   GI/Abd soft, patient has intra-abdominal drain in  place CNS: Alert, awake, oriented x3.  Patient moves all extremities. Extremities: No pedal edema.  Data Review: I have personally reviewed the laboratory data and studies available.  Recent Labs  Lab 01/31/21 0607 02/01/21 0600 02/02/21 0515 02/03/21 0215 02/04/21 0545  WBC 8.8 9.4 7.9 8.8 8.5  NEUTROABS 2.9 2.9 2.6 3.2  --   HGB 10.6* 10.4* 10.2* 10.9* 10.5*  HCT 35.2* 33.1* 33.4* 34.7* 33.1*  MCV 101.4* 100.0 100.0 99.1 97.6  PLT 415* 394 251 385 351   Recent Labs  Lab 01/31/21 0607 02/01/21 0600 02/02/21 0515 02/03/21 0215 02/04/21 0545 02/05/21 0538 02/06/21 0536  NA 137 138 137 139 139 140 141  K 3.8 3.6 3.5 3.4* 2.9* 3.7 3.2*  CL 103 107 107 107 107 106 106  CO2 25 25 23 24 25 28 25   GLUCOSE 118* 117* 107* 116* 112* 109* 121*  BUN 11 10 6  5* <5* <5* 6  CREATININE 0.64 0.59 0.45 0.61 0.47 0.56 0.55  CALCIUM 8.7* 8.4* 8.2* 8.4* 8.5* 8.6* 8.8*  MG 1.9 1.8 1.9 1.8  --   --   --   PHOS 4.0 3.6 3.3 3.3  --   --   --     F/u labs ordered Unresulted Labs (From admission, onward)          Start     Ordered   01/16/21 1201  CBC with Differential/Platelet  Once,   R  01/16/21 1201          Signed, Bonnell Public, MD [facility] 02/06/2021

## 2021-02-07 DIAGNOSIS — T8149XA Infection following a procedure, other surgical site, initial encounter: Secondary | ICD-10-CM | POA: Diagnosis not present

## 2021-02-07 LAB — RENAL FUNCTION PANEL
Albumin: 2.4 g/dL — ABNORMAL LOW (ref 3.5–5.0)
Anion gap: 8 (ref 5–15)
BUN: 8 mg/dL (ref 6–20)
CO2: 27 mmol/L (ref 22–32)
Calcium: 8.6 mg/dL — ABNORMAL LOW (ref 8.9–10.3)
Chloride: 105 mmol/L (ref 98–111)
Creatinine, Ser: 0.54 mg/dL (ref 0.44–1.00)
GFR, Estimated: >60 mL/min (ref >60)
Glucose, Bld: 116 mg/dL — ABNORMAL HIGH (ref 70–99)
Phosphorus: 3.9 mg/dL (ref 2.5–4.6)
Potassium: 3.6 mmol/L (ref 3.5–5.1)
Sodium: 140 mmol/L (ref 135–145)

## 2021-02-07 LAB — MAGNESIUM: Magnesium: 1.8 mg/dL (ref 1.7–2.4)

## 2021-02-07 NOTE — Progress Notes (Addendum)
PROGRESS NOTE  Wendy Barber  DOB: 1961-08-14  PCP: Glendon Axe, MD XKG:[id number]  DOA: 01/16/2021  LOS: 22 days   Chief Complaint  Patient presents with  . Diarrhea  . Wound Check    Brief narrative: Wendy Barber is a 60 y.o. female with PMH significant for metastatic appendiceal cancer with extensive surgical history as below, also with medical history of autoimmune hepatitis, liver cirrhosis, DVT and PE with IVC filter chronically on Lovenox, hypertension, diabetes mellitus type 2.. 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  She was in remission for 17 years.  However, in February 2022, patient had a remission.  She followed up at [facility] with Dr. Sharon Mt and underwent another debulking and administration of intraperitoneal chemotherapy.  Unfortunately, she had complications from this. She had an anastomotic leak that required another surgery.  She then had a open abdominal wound that required another surgery. Finally, she was able to come back to [place] in few weeks.  By this he started having persistent diarrhea leading to generalized weakness.  She also noted open discharge from the incision wound and hence presented to our ED on 3/26.   In the ED, she tested C. difficile positive. CT abdomen pelvis showed gas and fluid collection within the anterior abdominal wall, ventral surgical wound measuring 3.2 x 1.8 x 13 cm concerning for wound infection.  There was also a loculated fluid collection in the pelvis just superior and anterior to the urinary bladder measuring 5.5 x 2.6 x 3.8 cm, question sterile versus infected postoperative collection.  Patient was admitted to hospitalist service. General surgery, IR, medical oncology consultations were obtained.   Drain placed by IR for  intra-abdominal abscess.  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 See below for details  02/05/2021: Patient seen.  Diarrhea is slowly improving.  Patient reported 3 episodes of semiformed stool today. 02/06/2021: Patient seen.  Diarrhea continues to improve. 02/07/2021: No diarrhea in 2 days.  Patient reports increased drainage from the abdominal drain.  Infectious disease team has recommended cefdinir 300 Mg p.o. twice daily and Flagyl 500 Mg p.o. twice daily for intra-abdominal/abdominal wall infection (both antibiotics to be completed on 03/12/2021).  Patient is on IV Flagyl due to complaints that she cannot tolerate the taste of oral Flagyl.  Infectious disease team also recommended oral vancomycin for C. difficile prophylaxis to be continued till 02/07/2021.  Patient is not keen on being discharged tomorrow as the husband has to be in [place].  According to the patient, her husband will be back to town on on Tuesday.  Subjective: -No diarrhea in 2 days.   -Abdominal drain is draining more.    -No fever or chills. -No abdominal pain.  Assessment/Plan: Postsurgical abdominal wound infection Abdominal wall abscess s/p drain placement by IR on 01/27/2021 Enterocutaneous fistula - 4/12 -Patient presented with concerns for abdominal postsurgical wound infection. -4/6, IR placed a 10 French drain in her abscess and yielded 65 cc of amber-colored fluid. Her drain was connected to gravity bag.   -Culture from the drain grew Klebsiella pneumoniae.  -Per ID recommendation, patient is on cefdinir 300 mg p.o. twice daily and Flagyl 5 mg p.o. twice daily for 6 weeks until 5/20.  -4/12 repeat CT scan of abdomen suggested the presence of enterocutaneous fistula.  Per general surgery and IR, EC fistula is related to previous  surgery done in [place] in February.  Recommendation is to continue the current drain and follow-up with general surgeons at [facility] for further  evaluation of enterocutaneous fistula.  I discussed the case with IR Dr. Anselm Pancoast again this morning.  Repeat CT abdomen and pelvis was suggested to follow-up on the effectiveness of drain.  C. difficile colitis -Diarrhea improving.  Per ID recommendation, currently on prolonged course of oral vancomycin because of use of IV antibiotics.  Recommended to continue oral vancomycin for the duration of antibiotics +1 more week.   -Continue probiotics, low-dose Lomotil. -4/2, A dose of Dificid was given 02/05/2021: Slowly improving. 02/06/2021: Diarrhea has improved significantly.  Intractable abdominal pain -Due to colitis, intra-abdominal abscesses, EC fistula, intra-abdominal cancer -Currently being controlled with fentanyl patch, IV Dilaudid, oral Dilaudid. -I discussed with patient to cut back on her opioids.  We will stop IV Dilaudid at this time and continue with fentanyl patch and as needed oral Dilaudid  History of metastatic appendiceal cancer, pseudomyxoma peritonei and peritoneal carcinomatosis status post CRS/HI PEC and splenectomy in 2005 and recently CRS/HI PEC on 12/10/2020 for recurrent cancer -Patient sees Dr. [name] here in town but most of her cancer care was done at [facility].  -Will need to follow up at D/C with Surgeon in [facility] or Specialist at [facility]  Acute DVT of right common femoral vein, right popliteal vein, right posterior tibial vein and right peroneal veins, supra mesenteric vein, portal vein and iliac vein - 4/12 History of DVT and PE with IVC filter  -Was on prophylactic dose of Lovenox at home.  Switch to heparin drip.  Subsequently switched to oral Xarelto today.  Orthostatic hypotension EssentialHypertension -Patient is having orthostatic hypotension with dizziness despite of elevated baseline blood pressure in 140s and 150s.  This morning, blood pressure dropped from 143/79  to 111/78 on standing up.   -For now we will continue propranolol 20 mg twice daily  but keep amlodipine on hold. -Continue to monitor blood pressure.  History of autoimmune Hepatitis, liver cirrhosis -Patient was on azathioprine for a year before it was stopped in February 2022 prior to the surgery.  Continue to hold.  I would defer to her surgeons in [place] to determine on resuming it once the intra-abdominal infection is controlled. -LFTs currently normal.  Essential Tremor -Continue Propranolol 20 mg po BID  Anxiety -C/w Buspirone 5 mg p.o. daily with breakfast, 5 mg p.o. daily, and then 10 mg p.o. nightly. -May need Psych to see given Depression due to situation  Type 2 Diabetes Mellitus  -A1c 5.8 on 3/26. -Diet controlled.  Hypokalemia -Potassium level significantly low at 2.9 today.  Oral and IV replacement ordered.  Recheck tomorrow. 02/05/2021: Potassium today 3.7. 02/06/2021: Potassium is 3.2 today.  Continue to monitor and replete. Recent Labs  Lab 02/01/21 0600 02/02/21 0515 02/03/21 0215 02/04/21 0545 02/05/21 0538 02/06/21 0536 02/07/21 1035  K 3.6 3.5 3.4* 2.9* 3.7 3.2* 3.6  MG 1.8 1.9 1.8  --   --   --  1.8  PHOS 3.6 3.3 3.3  --   --   --  3.9   Chronic normocytic anemia -Hemoglobin stable between 10-11.    Mobility: Encourage ambulation.  PT eval obtained. No f/u recommended. Code Status:   Code Status: Full Code  Nutritional status: Body mass index is 37.11 kg/m. Nutrition Problem: Increased nutrient needs Etiology: cancer and cancer related treatments Signs/Symptoms: estimated needs Diet Order  Diet Heart Room service appropriate? Yes; Fluid consistency: Thin  Diet effective now                 DVT prophylaxis: Place TED hose Start: 01/31/21 1856   Antimicrobials:  Metronidazole IV, cefdinir oral, vancomycin oral Fluid: Normal saline 75 mL/h for orthostatic hypotension Consultants: Oncology, ID, surgery, IR Family Communication:  None at bedside.  Spoke to husband on the phone  Status is:  Inpatient  Remains inpatient appropriate because: Pending CT scan of abdomen and pelvis to evaluate for drain management.  If no new significant finding on CT abdomen and if pain tolerable on oral medications, can be discharged home in next 1 to 2 days.  Dispo: The patient is from: Home              Anticipated d/c is to: Home with home health RN              Patient currently is not medically stable to d/c.   Difficult to place patient No       Infusions:  . metronidazole 500 mg (02/07/21 1054)    Scheduled Meds: . busPIRone  5 mg Oral Q breakfast   And  . busPIRone  5 mg Oral Q1200   And  . busPIRone  10 mg Oral QHS  . cefdinir  300 mg Oral Q12H  . famotidine  40 mg Oral Daily  . fentaNYL  1 patch Transdermal Q72H  . Gerhardt's butt cream   Topical QID  . multivitamin with minerals  1 tablet Oral Daily  . polycarbophil  625 mg Oral Daily  . propranolol  20 mg Oral BID  . Rivaroxaban  15 mg Oral BID WC   Followed by  . [START ON 02/25/2021] rivaroxaban  20 mg Oral Q supper  . saccharomyces boulardii  250 mg Oral BID  . scopolamine  1 patch Transdermal Q72H  . sodium chloride flush  3 mL Intravenous Q12H  . sodium chloride flush  5 mL Intracatheter Q8H  . vancomycin  125 mg Oral Q12H    Antimicrobials: Anti-infectives (From admission, onward)   Start     Dose/Rate Route Frequency Ordered Stop   02/01/21 2200  metroNIDAZOLE (FLAGYL) IVPB 500 mg        500 mg 100 mL/hr over 60 Minutes Intravenous Every 12 hours 02/01/21 1804     01/30/21 1000  vancomycin (VANCOCIN) 50 mg/mL oral solution 125 mg        125 mg Oral Every 12 hours 01/29/21 1431     01/29/21 2200  metroNIDAZOLE (FLAGYL) tablet 500 mg  Status:  Discontinued        500 mg Oral Every 12 hours 01/29/21 1700 02/01/21 1804   01/29/21 1800  metroNIDAZOLE (FLAGYL) tablet 500 mg  Status:  Discontinued        500 mg Oral Every 8 hours 01/29/21 1431 01/29/21 1700   01/29/21 1530  cefdinir (OMNICEF) capsule 300 mg         300 mg Oral Every 12 hours 01/29/21 1431     01/28/21 1800  metroNIDAZOLE (FLAGYL) IVPB 500 mg  Status:  Discontinued        500 mg 100 mL/hr over 60 Minutes Intravenous Every 8 hours 01/28/21 1517 01/29/21 1431   01/28/21 1600  ciprofloxacin (CIPRO) IVPB 400 mg  Status:  Discontinued        400 mg 200 mL/hr over 60 Minutes Intravenous Every 12 hours 01/28/21 1517 01/29/21 1431  01/23/21 1315  vancomycin (VANCOCIN) 50 mg/mL oral solution 125 mg        125 mg Oral Every 6 hours 01/23/21 1226 01/29/21 2359   01/23/21 1000  fidaxomicin (DIFICID) tablet 200 mg  Status:  Discontinued        200 mg Oral 2 times daily 01/23/21 0859 01/23/21 1226   01/17/21 0600  vancomycin (VANCOREADY) IVPB 1500 mg/300 mL  Status:  Discontinued        1,500 mg 150 mL/hr over 120 Minutes Intravenous Every 24 hours 01/16/21 1732 01/17/21 1216   01/16/21 2200  metroNIDAZOLE (FLAGYL) IVPB 500 mg  Status:  Discontinued        500 mg 100 mL/hr over 60 Minutes Intravenous Every 8 hours 01/16/21 1622 01/17/21 1216   01/16/21 2000  vancomycin (VANCOCIN) 50 mg/mL oral solution 125 mg  Status:  Discontinued        125 mg Oral 4 times daily 01/16/21 1805 01/23/21 0859   01/16/21 1800  ceFEPIme (MAXIPIME) 2 g in sodium chloride 0.9 % 100 mL IVPB  Status:  Discontinued        2 g 200 mL/hr over 30 Minutes Intravenous Every 8 hours 01/16/21 1728 01/17/21 1216   01/16/21 1545  vancomycin (VANCOCIN) 50 mg/mL oral solution 125 mg  Status:  Discontinued        125 mg Oral 4 times daily 01/16/21 1539 01/16/21 1622   01/16/21 1445  metroNIDAZOLE (FLAGYL) IVPB 500 mg        500 mg 100 mL/hr over 60 Minutes Intravenous  Once 01/16/21 1438 01/16/21 1622   01/16/21 1330  vancomycin (VANCOCIN) IVPB 1000 mg/200 mL premix        1,000 mg 200 mL/hr over 60 Minutes Intravenous  Once 01/16/21 1320 01/16/21 1538      PRN meds: acetaminophen, calcium carbonate, diclofenac Sodium, hydrALAZINE, HYDROmorphone, ondansetron **OR**  ondansetron (ZOFRAN) IV, simethicone   Objective: Vitals:   02/07/21 0551 02/07/21 1502  BP: 131/76 114/76  Pulse: 73 62  Resp: 18 16  Temp: 98.5 F (36.9 C) 98.4 F (36.9 C)  SpO2: 94% 91%    Intake/Output Summary (Last 24 hours) at 02/07/2021 1802 Last data filed at 02/07/2021 1743 Gross per 24 hour  Intake 245 ml  Output 100 ml  Net 145 ml   Filed Weights   01/16/21 1700 02/04/21 0618 02/05/21 0700  Weight: 102.1 kg 103.9 kg 104.3 kg   Weight change:  Body mass index is 37.11 kg/m.   Physical Exam: General exam: Pleasant is not in any distress.   HEENT: Patient has pallor.  No jaundice.   Lungs: Clear to auscultation bilaterally CVS: S1-S2.   GI/Abd soft, patient has intra-abdominal drain in place CNS: Alert, awake, oriented x3.  Patient moves all extremities. Extremities: No pedal edema.  Data Review: I have personally reviewed the laboratory data and studies available.  Recent Labs  Lab 02/01/21 0600 02/02/21 0515 02/03/21 0215 02/04/21 0545  WBC 9.4 7.9 8.8 8.5  NEUTROABS 2.9 2.6 3.2  --   HGB 10.4* 10.2* 10.9* 10.5*  HCT 33.1* 33.4* 34.7* 33.1*  MCV 100.0 100.0 99.1 97.6  PLT 394 251 385 351   Recent Labs  Lab 02/01/21 0600 02/02/21 0515 02/03/21 0215 02/04/21 0545 02/05/21 0538 02/06/21 0536 02/07/21 1035  NA 138 137 139 139 140 141 140  K 3.6 3.5 3.4* 2.9* 3.7 3.2* 3.6  CL 107 107 107 107 106 106 105  CO2 25 23 24 25  28  25 27  GLUCOSE 117* 107* 116* 112* 109* 121* 116*  BUN 10 6 5* <5* <5* 6 8  CREATININE 0.59 0.45 0.61 0.47 0.56 0.55 0.54  CALCIUM 8.4* 8.2* 8.4* 8.5* 8.6* 8.8* 8.6*  MG 1.8 1.9 1.8  --   --   --  1.8  PHOS 3.6 3.3 3.3  --   --   --  3.9    F/u labs ordered Unresulted Labs (From admission, onward)          Start     Ordered   01/16/21 1201  CBC with Differential/Platelet  Once,   R        01/16/21 1201          Signed, Bonnell Public, MD [facility] 02/07/2021

## 2021-02-07 NOTE — Evaluation (Signed)
Occupational Therapy Evaluation Patient Details Name: Wendy Barber MRN: [id number] DOB: 1961-03-19 Today's Date: 02/07/2021    History of Present Illness The patient is a 60 year old Caucasian female admitted with C. difficile colitis and for concern for postop wound infection.  Pt with hx of DVT and IVC filter placement, now with acute DVT (SMV, PVT, Iliac thrombus).  PMHx significant for but not limited to metastatic appendiceal cancer, pseudomyxoma peritonei and peritoneal carcinomatosis status post CRS/HIPEC and splenectomy in 2005 and recent CRS/HIPEC on 12/10/2020 for recurrent cancer, autoimmune hepatitis, DVT and PE with IVC filter currently on Lovenox, hypertension, diabetes mellitus type 2   Clinical Impression   Patient agreeable to OT however reporting hip pain and just medicated for pain prior to session. Patient mod I with bed mobility, supervision for functional transfer for safety and to complete g/h tasks standing sink side. Patient able to ambulate ~8 ft in room using IV pole for stability before having to sit due to hip pain. Encourage further ambulation/mobility later today once pain medication taken more affect, patient verbalize understanding. Continue with POC.     Follow Up Recommendations  No OT follow up    Equipment Recommendations  None recommended by OT       Precautions / Restrictions Precautions Precautions: Other (comment);Fall Precaution Comments: low abd wound & drain; reports history of unexplained syncope causing passing out (approx 4 x a year), orthostatic hypotension Restrictions Weight Bearing Restrictions: No      Mobility Bed Mobility Overal bed mobility: Modified Independent                  Transfers Overall transfer level: Needs assistance Equipment used: None Transfers: Sit to/from Stand Sit to Stand: Supervision              Balance Overall balance assessment: Mild deficits observed, not formally tested                                          ADL either performed or assessed with clinical judgement   ADL Overall ADL's : Needs assistance/impaired     Grooming: Supervision/safety;Oral care;Wash/dry face;Standing Grooming Details (indicate cue type and reason): patient tolerate standing at sink side for all of g/h tasks, leans heavily onto sink side 2* pain                 Toilet Transfer: Supervision/safety;Ambulation Toilet Transfer Details (indicate cue type and reason): patient participate in functional ambulation in room, pushing IV pole for stability at supervision level. patient tolerate ~8 ft ambulation before having to sit due to hip pain.         Functional mobility during ADLs: Supervision/safety General ADL Comments: patient agreeable to OT however limited 2* hip pain                  Pertinent Vitals/Pain Pain Assessment: Faces Faces Pain Scale: Hurts even more Pain Location: hip Pain Descriptors / Indicators: Grimacing;Sore Pain Intervention(s): Limited activity within patient's tolerance;Premedicated before session              Cognition Arousal/Alertness: Awake/alert Behavior During Therapy: WFL for tasks assessed/performed Overall Cognitive Status: Within Functional Limits for tasks assessed  OT Goals(Current goals can be found in the care plan section) Acute Rehab OT Goals Patient Stated Goal: improve strength and endurance OT Goal Formulation: With patient Time For Goal Achievement: 02/14/21 Potential to Achieve Goals: Good ADL Goals Pt Will Transfer to Toilet: with modified independence Pt Will Perform Toileting - Clothing Manipulation and hygiene: with modified independence Pt/caregiver will Perform Home Exercise Program: Increased strength;Right Upper extremity;Left upper extremity;With theraband;With written HEP provided Additional ADL Goal  #1: Patient will perform 10 min functional activity or exercise activity as evidence of improving activity tolerance  OT Frequency: Min 2X/week    AM-PAC OT "6 Clicks" Daily Activity     Outcome Measure Help from another person eating meals?: None Help from another person taking care of personal grooming?: A Little Help from another person toileting, which includes using toliet, bedpan, or urinal?: A Little Help from another person bathing (including washing, rinsing, drying)?: A Little Help from another person to put on and taking off regular upper body clothing?: A Little Help from another person to put on and taking off regular lower body clothing?: A Little 6 Click Score: 19   End of Session Nurse Communication: Mobility status  Activity Tolerance: Patient limited by pain Patient left: in chair;with call bell/phone within reach  OT Visit Diagnosis: Muscle weakness (generalized) (M62.81);Pain Pain - part of body: Hip (abdomen)                Time: 1157-2620 OT Time Calculation (min): 12 min Charges:  OT General Charges $OT Visit: 1 Visit OT Treatments $Self Care/Home Management : 8-22 mins  Delbert Phenix OT OT pager: [phone]  Rosemary Holms 02/07/2021, 12:56 PM

## 2021-02-08 ENCOUNTER — Other Ambulatory Visit: Payer: Self-pay | Admitting: Student

## 2021-02-08 DIAGNOSIS — T8149XA Infection following a procedure, other surgical site, initial encounter: Secondary | ICD-10-CM | POA: Diagnosis not present

## 2021-02-08 MED ORDER — METRONIDAZOLE 500 MG PO TABS
500.0000 mg | ORAL_TABLET | Freq: Two times a day (BID) | ORAL | Status: DC
  Administered 2021-02-08 – 2021-02-09 (×2): 500 mg via ORAL
  Filled 2021-02-08 (×2): qty 1

## 2021-02-08 MED ORDER — GERHARDT'S BUTT CREAM
TOPICAL_CREAM | Freq: Four times a day (QID) | CUTANEOUS | Status: DC | PRN
  Filled 2021-02-08: qty 1

## 2021-02-08 NOTE — Progress Notes (Signed)
Nutrition Follow-up  DOCUMENTATION CODES:  Obesity unspecified  INTERVENTION:  Encourage PO intake.  Add snacks TID.  Add double portions of protein at meals.  Continue MVI with minerals daily.  NUTRITION DIAGNOSIS:  Increased nutrient needs related to cancer and cancer related treatments as evidenced by estimated needs. - ongoing  GOAL:  Patient will meet greater than or equal to 90% of their needs - meeting  MONITOR:  PO intake,Supplement acceptance,Labs,Weight trends,I & O's,Skin  REASON FOR ASSESSMENT:  Malnutrition Screening Tool    ASSESSMENT:  60 year old female with a past medical history of metastatic appendiceal cancer, pseudomyxoma peritonei and peritoneal carcinomatosis s/p CRS/HIPEC and splenectomy in 2005 and recently CRS/HIPEC on 12/10/2020 for recurrent cancer, autoimmune hepatitis, DVT and PE with IVC filter currently on Lovenox, hypertension, diabetes who presented to the ED with multiple complaints including intractable diarrhea since her procedure, fatigue and infection wound drainage this a.m.She was admitted for C diff colitis and concern for post op wound infection 4/6 - abdominal wall abscess s/p drain placement by IR 4/12 - enterocutaneous fistula 4/15 - d/c beneprotein, as pt refusing 4/16 - c diff diarrhea improving significantly  Per documented meals from the last few days, pt eating 25-100%, mostly 100%. Spoke with pt at bedside. Pt reports that she has not like any supplements she has receives so far, and they have been d/c'd by MD. She reports being a particular eater and not interested in taking other pills or drinking anything new given that each supplement has upset her stomach or caused diarrhea.  Encouraged meal intake. Offered snacks TID with protein and she is willing to try. Also double portions of protein at meals is also another intervention she is willing to try. May also try Boost Plus. Continue MVI with minerals daily.  Weight updated  on 4/15. Pt now weighs 229 lbs.  Medications: Pepcid, fentanyl patch, MVI with minerals, fibercon, vancomycin, flagyl, dilaudid, zofran PRN (last received today) Labs: reviewed; CBG 132-191 HbA1c: 5.8% (12/2020)  Diet Order:   Diet Order            Diet Heart Room service appropriate? Yes; Fluid consistency: Thin  Diet effective now                EDUCATION NEEDS:  No education needs have been identified at this time  Skin:  Skin Assessment: Skin Integrity Issues: Skin Integrity Issues:: Incisions Incisions: lower abdomen  Last BM:  02/06/21  Height:  Ht Readings from Last 1 Encounters:  01/16/21 5\' 6"  (1.676 m)   Weight:  Wt Readings from Last 1 Encounters:  02/05/21 104.3 kg   Ideal Body Weight:  59.1 kg  BMI:  Body mass index is 37.11 kg/m.  Estimated Nutritional Needs:  Kcal:  2000-2200 Protein:  100-115g Fluid:  2L/day  Derrel Nip, RD, LDN Registered Dietitian After Hours/Weekend Pager # in [phone]

## 2021-02-08 NOTE — Progress Notes (Signed)
Physical Therapy Treatment Patient Details Name: Wendy Barber MRN: [id number] DOB: [birth year] Today's Date: 02/08/2021    History of Present Illness The patient is a 60 year old Caucasian female admitted with C. difficile colitis and for concern for postop wound infection.  Pt with hx of DVT and IVC filter placement, now with acute DVT (SMV, PVT, Iliac thrombus).  PMHx significant for but not limited to metastatic appendiceal cancer, pseudomyxoma peritonei and peritoneal carcinomatosis status post CRS/HIPEC and splenectomy in 2005 and recent CRS/HIPEC on 12/10/2020 for recurrent cancer, autoimmune hepatitis, DVT and PE with IVC filter currently on Lovenox, hypertension, diabetes mellitus type 2    PT Comments    Patient reports not feeling as well as yesterday but agreeable to mobilize with therapy. Pt taking extra time to complete all tasks. Denied dizziness with short distance to bathroom but reported feeling weak overall and seated rest provided prior to gait in hallway. Pt utilized RW for longer ambulation in hall and gait pattern less antalgic with UE support, no LOB or c/o dizziness. Educated pt on benefits of mobilizing with RW and on functional sit<>stands for LE strengthening. EOS pt tearful about hospital course and difficulties with surgeries; this subsided and pt setup with meal and call bell. Acute PT will continue to follow pt and progress as able. Encouraged her to continue mobilizing with nursing staff and with husband when he is here.     Follow Up Recommendations  No PT follow up     Equipment Recommendations  None recommended by PT    Recommendations for Other Services       Precautions / Restrictions Precautions Precautions: Other (comment);Fall Precaution Comments: low abd wound & drain; reports history of unexplained syncope causing passing out (approx 4 x a year), orthostatic hypotension Restrictions Weight Bearing Restrictions: No    Mobility  Bed  Mobility Overal bed mobility: Modified Independent             General bed mobility comments: pt HOB slightly elevated and taking extra time to mobilize to EOB    Transfers Overall transfer level: Needs assistance Equipment used: None Transfers: Sit to/from Stand Sit to Stand: Supervision         General transfer comment: supervision for safety, stand from EOB, toilet, recliner  Ambulation/Gait Ambulation/Gait assistance: Min guard Gait Distance (Feet): 80 Feet Assistive device: Rolling walker (2 wheeled) Gait Pattern/deviations: Step-through pattern;Decreased stride length Gait velocity: decr   General Gait Details: pt ambulated with no device into bathroom and c/o bi hip pain. RW provided and pt reported decreased pain with ability to use UE's on RW. no overt LOB, gait less antalgic with RW.   Stairs             Wheelchair Mobility    Modified Rankin (Stroke Patients Only)       Balance Overall balance assessment: Mild deficits observed, not formally tested                                          Cognition Arousal/Alertness: Awake/alert Behavior During Therapy: WFL for tasks assessed/performed Overall Cognitive Status: Within Functional Limits for tasks assessed                                        Exercises Other Exercises Other Exercises: educated  on benefits of sit<>stands for functional strengthening    General Comments        Pertinent Vitals/Pain Pain Assessment: Faces Faces Pain Scale: Hurts little more Pain Location: wound site; Lt>Rt hip Pain Descriptors / Indicators: Grimacing;Sore Pain Intervention(s): Limited activity within patient's tolerance;Monitored during session;Repositioned;Premedicated before session    Home Living                      Prior Function            PT Goals (current goals can now be found in the care plan section) Acute Rehab PT Goals Patient Stated Goal:  improve strength and endurance PT Goal Formulation: With patient Time For Goal Achievement: 02/17/21 Potential to Achieve Goals: Good Progress towards PT goals: Progressing toward goals    Frequency    Min 3X/week      PT Plan Current plan remains appropriate    Co-evaluation              AM-PAC PT "6 Clicks" Mobility   Outcome Measure  Help needed turning from your back to your side while in a flat bed without using bedrails?: A Little Help needed moving from lying on your back to sitting on the side of a flat bed without using bedrails?: A Little Help needed moving to and from a bed to a chair (including a wheelchair)?: A Little Help needed standing up from a chair using your arms (e.g., wheelchair or bedside chair)?: A Little Help needed to walk in hospital room?: A Little Help needed climbing 3-5 steps with a railing? : A Little 6 Click Score: 18    End of Session Equipment Utilized During Treatment: Gait belt Activity Tolerance: Patient tolerated treatment well Patient left: in chair;with call bell/phone within reach Nurse Communication: Mobility status PT Visit Diagnosis: Difficulty in walking, not elsewhere classified (R26.2)     Time: 1165-7903 PT Time Calculation (min) (ACUTE ONLY): 27 min  Charges:  $Gait Training: 8-22 mins $Therapeutic Activity: 8-22 mins                     Verner Mould, DPT Acute Rehabilitation Services Office [phone] Pager [phone]     Jacques Navy 02/08/2021, 2:25 PM

## 2021-02-08 NOTE — Progress Notes (Signed)
PROGRESS NOTE  Wendy Barber  DOB: 07/16/1961  PCP: Glendon Axe, MD ZTI:[id number]  DOA: 01/16/2021  LOS: 23 days   Chief Complaint  Patient presents with  . Diarrhea  . Wound Check    Brief narrative: Wendy Barber is a 60 y.o. female with PMH significant for metastatic appendiceal cancer with extensive surgical history as below, also with medical history of autoimmune hepatitis, liver cirrhosis, DVT and PE with IVC filter chronically on Lovenox, hypertension, diabetes mellitus type 2.. 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  She was in remission for 17 years.  However, in February 2022, patient had a remission.  She followed up at [facility] with Dr. Sharon Mt and underwent another debulking and administration of intraperitoneal chemotherapy.  Unfortunately, she had complications from this. She had an anastomotic leak that required another surgery.  She then had a open abdominal wound that required another surgery. Finally, she was able to come back to [place] in few weeks.  By this he started having persistent diarrhea leading to generalized weakness.  She also noted open discharge from the incision wound and hence presented to our ED on 3/26.   In the ED, she tested C. difficile positive. CT abdomen pelvis showed gas and fluid collection within the anterior abdominal wall, ventral surgical wound measuring 3.2 x 1.8 x 13 cm concerning for wound infection.  There was also a loculated fluid collection in the pelvis just superior and anterior to the urinary bladder measuring 5.5 x 2.6 x 3.8 cm, question sterile versus infected postoperative collection.  Patient was admitted to hospitalist service. General surgery, IR, medical oncology consultations were obtained.   Drain placed by IR for  intra-abdominal abscess.  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 See below for details  02/05/2021: Patient seen.  Diarrhea is slowly improving.  Patient reported 3 episodes of semiformed stool today. 02/06/2021: Patient seen.  Diarrhea continues to improve. 02/07/2021: No diarrhea in 2 days.  Patient reports increased drainage from the abdominal drain.  Infectious disease team has recommended cefdinir 300 Mg p.o. twice daily and Flagyl 500 Mg p.o. twice daily for intra-abdominal/abdominal wall infection (both antibiotics to be completed on 03/12/2021).  Patient is on IV Flagyl due to complaints that she cannot tolerate the taste of oral Flagyl.  Infectious disease team also recommended oral vancomycin for C. difficile prophylaxis to be continued till 02/07/2021.  Patient is not keen on being discharged tomorrow as the husband has to be in [place], [place].  According to the patient, her husband will be back to town on on Tuesday.  02/08/2021: Patient seen.  Discussed with husband over the phone.  Patient reports nausea today.  Not keen on being discharged home today.  Patient has had problems tolerating oral metronidazole.  Patient will need to be on metronidazole till 03/12/2021.  Discussed the option of changing IV metronidazole to oral and using applesauce to take the metronidazole.  Patient is willing to try this.  Patient will also need to be on oral cefdinir until Mar 12, 2021 and oral vancomycin until Mar 19, 2021.  Likely discharge tomorrow if patient tolerates oral Flagyl.  Transition of care team has been consulted.  Home health nurse will assist with the abdominal drain on discharge.  Subjective: -No diarrhea -Abdominal drain is draining more.    -No fever or chills. -No abdominal pain.  Assessment/Plan: Postsurgical abdominal wound infection Abdominal wall abscess s/p drain placement by IR on 01/27/2021 Enterocutaneous fistula - 4/12 -Patient presented with  concerns for abdominal postsurgical wound infection. -4/6, IR placed a 10 French drain in her abscess and yielded 65 cc of amber-colored fluid. Her drain was connected to gravity bag.   -Culture from the drain grew Klebsiella pneumoniae.  -Per ID recommendation, patient is on cefdinir 300 mg p.o. twice daily and Flagyl 5 mg p.o. twice daily for 6 weeks until 5/20.  -4/12 repeat CT scan of abdomen suggested the presence of enterocutaneous fistula.  Per general surgery and IR, EC fistula is related to previous surgery done in [place] in February.  Recommendation is to continue the current drain and follow-up with general surgeons at [facility] for further evaluation of enterocutaneous fistula.  I discussed the case with IR Dr. Anselm Pancoast again this morning.  Repeat CT abdomen and pelvis was suggested to follow-up on the effectiveness of drain. 02/08/2021: Patient will be discharged on oral antibiotics and abdominal drain.  See above documentation.  C. difficile colitis -Diarrhea improving.  Per ID recommendation, currently on prolonged course of oral vancomycin because of use of IV antibiotics.  Recommended to continue oral vancomycin for the duration of antibiotics +1 more week.   -Continue probiotics, low-dose Lomotil. -4/2, A dose of Dificid was given 02/05/2021: Slowly improving. 02/06/2021: Diarrhea has improved significantly. 02/08/2021: Patient completed course of C. difficile treatment a long time ago.  Patient has continued oral vancomycin twice daily for prophylaxis as patient is on antibiotics.  Patient is a high risk for recurrent C. difficile  Intractable abdominal pain -Due to colitis, intra-abdominal abscesses, EC fistula, intra-abdominal cancer -Currently being controlled with fentanyl patch, IV Dilaudid, oral Dilaudid. -I discussed with patient to cut back on her opioids.  We will stop IV Dilaudid at this time and continue with fentanyl patch and as needed oral Dilaudid 02/08/2021: Improved  significantly/resolved.  History of metastatic appendiceal cancer, pseudomyxoma peritonei and peritoneal carcinomatosis status post CRS/HI PEC and splenectomy in 2005 and recently CRS/HI PEC on 12/10/2020 for recurrent cancer -Patient sees Dr. [name] here in town but most of her cancer care was done at [facility].  -Will need to follow up at D/C with Surgeon in [facility] or Specialist at [facility]  Acute DVT of right common femoral vein, right popliteal vein, right posterior tibial vein and right peroneal veins, supra mesenteric vein, portal vein and iliac vein - 4/12 History of DVT and PE with IVC filter  -Was on prophylactic dose of Lovenox at home.  Switch to heparin drip.  Subsequently switched to oral Xarelto today.  Orthostatic hypotension EssentialHypertension -Patient is having orthostatic hypotension with dizziness despite of elevated baseline blood pressure in 140s and 150s.  This morning, blood pressure dropped from 143/79  to 111/78 on standing up.   -For now we will continue propranolol 20 mg twice daily but keep amlodipine on hold. -Continue to monitor blood pressure.  History of autoimmune Hepatitis, liver cirrhosis -Patient was on azathioprine for a year before it was stopped in February 2022 prior to the surgery.  Continue to hold.  I would defer to her surgeons in [place] to determine on resuming it once the intra-abdominal infection is controlled. -LFTs currently normal.  Essential Tremor -Continue Propranolol 20 mg po BID  Anxiety -C/w Buspirone 5 mg p.o. daily with breakfast, 5 mg p.o. daily, and then 10 mg p.o. nightly. -May need Psych to see given Depression due to situation  Type 2 Diabetes Mellitus  -A1c 5.8 on 3/26. -Diet controlled.  Hypokalemia -Potassium level significantly low at 2.9 today.  Oral and IV replacement ordered.  Recheck tomorrow. 02/05/2021: Potassium today 3.7. 02/06/2021: Potassium is 3.2 today.  Continue to monitor and  replete. 02/08/2021: Potassium level was 4.6 on 02/07/2021. Recent Labs  Lab 02/02/21 0515 02/03/21 0215 02/04/21 0545 02/05/21 0538 02/06/21 0536 02/07/21 1035  K 3.5 3.4* 2.9* 3.7 3.2* 3.6  MG 1.9 1.8  --   --   --  1.8  PHOS 3.3 3.3  --   --   --  3.9   Chronic normocytic anemia -Hemoglobin stable between 10-11.    Mobility: Encourage ambulation.  PT eval obtained. No f/u recommended. Code Status:   Code Status: Full Code  Nutritional status: Body mass index is 37.11 kg/m. Nutrition Problem: Increased nutrient needs Etiology: cancer and cancer related treatments Signs/Symptoms: estimated needs Diet Order            Diet Heart Room service appropriate? Yes; Fluid consistency: Thin  Diet effective now                 DVT prophylaxis: Place TED hose Start: 01/31/21 1856   Antimicrobials:  Metronidazole IV, cefdinir oral, vancomycin oral Fluid: Normal saline 75 mL/h for orthostatic hypotension Consultants: Oncology, ID, surgery, IR Family Communication:  None at bedside.  Spoke to husband on the phone  Status is: Inpatient  Remains inpatient appropriate because: Pending CT scan of abdomen and pelvis to evaluate for drain management.  If no new significant finding on CT abdomen and if pain tolerable on oral medications, can be discharged home in next 1 to 2 days.  Dispo: The patient is from: Home              Anticipated d/c is to: Home with home health RN              Patient currently is not medically stable to d/c.   Difficult to place patient No       Infusions:    Scheduled Meds: . busPIRone  5 mg Oral Q breakfast   And  . busPIRone  5 mg Oral Q1200   And  . busPIRone  10 mg Oral QHS  . cefdinir  300 mg Oral Q12H  . famotidine  40 mg Oral Daily  . fentaNYL  1 patch Transdermal Q72H  . metroNIDAZOLE  500 mg Oral Q12H  . multivitamin with minerals  1 tablet Oral Daily  . polycarbophil  625 mg Oral Daily  . propranolol  20 mg Oral BID  .  Rivaroxaban  15 mg Oral BID WC   Followed by  . [START ON 02/25/2021] rivaroxaban  20 mg Oral Q supper  . saccharomyces boulardii  250 mg Oral BID  . scopolamine  1 patch Transdermal Q72H  . sodium chloride flush  3 mL Intravenous Q12H  . sodium chloride flush  5 mL Intracatheter Q8H  . vancomycin  125 mg Oral Q12H    Antimicrobials: Anti-infectives (From admission, onward)   Start     Dose/Rate Route Frequency Ordered Stop   02/08/21 2200  metroNIDAZOLE (FLAGYL) tablet 500 mg        500 mg Oral Every 12 hours 02/08/21 1617     02/01/21 2200  metroNIDAZOLE (FLAGYL) IVPB 500 mg  Status:  Discontinued        500 mg 100 mL/hr over 60 Minutes Intravenous Every 12 hours 02/01/21 1804 02/08/21  1617   01/30/21 1000  vancomycin (VANCOCIN) 50 mg/mL oral solution 125 mg        125 mg Oral Every 12 hours 01/29/21 1431     01/29/21 2200  metroNIDAZOLE (FLAGYL) tablet 500 mg  Status:  Discontinued        500 mg Oral Every 12 hours 01/29/21 1700 02/01/21 1804   01/29/21 1800  metroNIDAZOLE (FLAGYL) tablet 500 mg  Status:  Discontinued        500 mg Oral Every 8 hours 01/29/21 1431 01/29/21 1700   01/29/21 1530  cefdinir (OMNICEF) capsule 300 mg        300 mg Oral Every 12 hours 01/29/21 1431     01/28/21 1800  metroNIDAZOLE (FLAGYL) IVPB 500 mg  Status:  Discontinued        500 mg 100 mL/hr over 60 Minutes Intravenous Every 8 hours 01/28/21 1517 01/29/21 1431   01/28/21 1600  ciprofloxacin (CIPRO) IVPB 400 mg  Status:  Discontinued        400 mg 200 mL/hr over 60 Minutes Intravenous Every 12 hours 01/28/21 1517 01/29/21 1431   01/23/21 1315  vancomycin (VANCOCIN) 50 mg/mL oral solution 125 mg        125 mg Oral Every 6 hours 01/23/21 1226 01/29/21 2359   01/23/21 1000  fidaxomicin (DIFICID) tablet 200 mg  Status:  Discontinued        200 mg Oral 2 times daily 01/23/21 0859 01/23/21 1226   01/17/21 0600  vancomycin (VANCOREADY) IVPB 1500 mg/300 mL  Status:  Discontinued        1,500 mg 150 mL/hr  over 120 Minutes Intravenous Every 24 hours 01/16/21 1732 01/17/21 1216   01/16/21 2200  metroNIDAZOLE (FLAGYL) IVPB 500 mg  Status:  Discontinued        500 mg 100 mL/hr over 60 Minutes Intravenous Every 8 hours 01/16/21 1622 01/17/21 1216   01/16/21 2000  vancomycin (VANCOCIN) 50 mg/mL oral solution 125 mg  Status:  Discontinued        125 mg Oral 4 times daily 01/16/21 1805 01/23/21 0859   01/16/21 1800  ceFEPIme (MAXIPIME) 2 g in sodium chloride 0.9 % 100 mL IVPB  Status:  Discontinued        2 g 200 mL/hr over 30 Minutes Intravenous Every 8 hours 01/16/21 1728 01/17/21 1216   01/16/21 1545  vancomycin (VANCOCIN) 50 mg/mL oral solution 125 mg  Status:  Discontinued        125 mg Oral 4 times daily 01/16/21 1539 01/16/21 1622   01/16/21 1445  metroNIDAZOLE (FLAGYL) IVPB 500 mg        500 mg 100 mL/hr over 60 Minutes Intravenous  Once 01/16/21 1438 01/16/21 1622   01/16/21 1330  vancomycin (VANCOCIN) IVPB 1000 mg/200 mL premix        1,000 mg 200 mL/hr over 60 Minutes Intravenous  Once 01/16/21 1320 01/16/21 1538      PRN meds: acetaminophen, calcium carbonate, diclofenac Sodium, Gerhardt's butt cream, hydrALAZINE, HYDROmorphone, ondansetron **OR** ondansetron (ZOFRAN) IV, simethicone   Objective: Vitals:   02/08/21 1000 02/08/21 1353  BP: (!) 154/97 119/88  Pulse:  78  Resp:  18  Temp:  98 F (36.7 C)  SpO2:  90%    Intake/Output Summary (Last 24 hours) at 02/08/2021 1843 Last data filed at 02/08/2021 0600 Gross per 24 hour  Intake 5 ml  Output 15 ml  Net -10 ml   Filed Weights   01/16/21 1700  02/04/21 0618 02/05/21 0700  Weight: 102.1 kg 103.9 kg 104.3 kg   Weight change:  Body mass index is 37.11 kg/m.   Physical Exam: General exam: Pleasant is not in any distress.   HEENT: Patient has pallor.  No jaundice.   Lungs: Clear to auscultation bilaterally CVS: S1-S2.   GI/Abd soft, patient has intra-abdominal drain in place CNS: Alert, awake, oriented x3.  Patient  moves all extremities. Extremities: No pedal edema.  Data Review: I have personally reviewed the laboratory data and studies available.  Recent Labs  Lab 02/02/21 0515 02/03/21 0215 02/04/21 0545  WBC 7.9 8.8 8.5  NEUTROABS 2.6 3.2  --   HGB 10.2* 10.9* 10.5*  HCT 33.4* 34.7* 33.1*  MCV 100.0 99.1 97.6  PLT 251 385 351   Recent Labs  Lab 02/02/21 0515 02/03/21 0215 02/04/21 0545 02/05/21 0538 02/06/21 0536 02/07/21 1035  NA 137 139 139 140 141 140  K 3.5 3.4* 2.9* 3.7 3.2* 3.6  CL 107 107 107 106 106 105  CO2 23 24 25 28 25 27   GLUCOSE 107* 116* 112* 109* 121* 116*  BUN 6 5* <5* <5* 6 8  CREATININE 0.45 0.61 0.47 0.56 0.55 0.54  CALCIUM 8.2* 8.4* 8.5* 8.6* 8.8* 8.6*  MG 1.9 1.8  --   --   --  1.8  PHOS 3.3 3.3  --   --   --  3.9    F/u labs ordered Unresulted Labs (From admission, onward)          Start     Ordered   01/16/21 1201  CBC with Differential/Platelet  Once,   R        01/16/21 1201          Signed, Bonnell Public, MD [facility] 02/08/2021

## 2021-02-08 NOTE — Progress Notes (Incomplete)
PROGRESS NOTE  Wendy Barber  DOB: [birth year]  PCP: Glendon Axe, MD JJK:[id number]  DOA: 01/16/2021  LOS: 23 days   Chief Complaint  Patient presents with  . Diarrhea  . Wound Check    Brief narrative: Wendy Barber is a 60 y.o. female with PMH significant for metastatic appendiceal cancer with extensive surgical history as below, also with medical history of autoimmune hepatitis, liver cirrhosis, DVT and PE with IVC filter chronically on Lovenox, hypertension, diabetes mellitus type 2.. 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  She was in remission for 17 years.  However, in February 2022, patient had a remission.  She followed up at [facility] with Dr. Sharon Mt and underwent another debulking and administration of intraperitoneal chemotherapy.  Unfortunately, she had complications from this. She had an anastomotic leak that required another surgery.  She then had a open abdominal wound that required another surgery. Finally, she was able to come back to [place] in few weeks.  By this he started having persistent diarrhea leading to generalized weakness.  She also noted open discharge from the incision wound and hence presented to our ED on 3/26.   In the ED, she tested C. difficile positive. CT abdomen pelvis showed gas and fluid collection within the anterior abdominal wall, ventral surgical wound measuring 3.2 x 1.8 x 13 cm concerning for wound infection.  There was also a loculated fluid collection in the pelvis just superior and anterior to the urinary bladder measuring 5.5 x 2.6 x 3.8 cm, question sterile versus infected postoperative collection.  Patient was admitted to hospitalist service. General surgery, IR, medical oncology consultations were obtained.   Drain placed by IR for  intra-abdominal abscess.  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 See below for details  02/05/2021: Patient seen.  Diarrhea is slowly improving.  Patient reported 3 episodes of semiformed stool today. 02/06/2021: Patient seen.  Diarrhea continues to improve. 02/07/2021: No diarrhea in 2 days.  Patient reports increased drainage from the abdominal drain.  Infectious disease team has recommended cefdinir 300 Mg p.o. twice daily and Flagyl 500 Mg p.o. twice daily for intra-abdominal/abdominal wall infection (both antibiotics to be completed on 03/12/2021).  Patient is on IV Flagyl due to complaints that she cannot tolerate the taste of oral Flagyl.  Infectious disease team also recommended oral vancomycin for C. difficile prophylaxis to be continued till 03/19/2021.  Patient is not keen on being discharged tomorrow as the husband has to be in [place], [place].  According to the patient, her husband will be about 4 pussy TDC on Tuesday.  Subjective: -No diarrhea in 2 days.   -Abdominal drain is draining more.    -No fever or chills. -No abdominal pain.  Assessment/Plan: Postsurgical abdominal wound infection Abdominal wall abscess s/p drain placement by IR on 01/27/2021 Enterocutaneous fistula - 4/12 -Patient presented with concerns for abdominal postsurgical wound infection. -4/6, IR placed a 10 French drain in her abscess and yielded 65 cc of amber-colored fluid. Her drain was connected to gravity bag.   -Culture from the drain grew Klebsiella pneumoniae.  -Per ID recommendation, patient is on cefdinir 300 mg p.o. twice daily and Flagyl 5 mg p.o. twice daily for 6 weeks until 5/20.  -4/12 repeat CT scan of abdomen suggested the presence of enterocutaneous fistula.  Per general surgery and IR, EC fistula is related to previous  surgery done in [place] in February.  Recommendation is to continue the current drain and follow-up with general surgeons at [facility] for further  evaluation of enterocutaneous fistula.  I discussed the case with IR Dr. Anselm Pancoast again this morning.  Repeat CT abdomen and pelvis was suggested to follow-up on the effectiveness of drain.  C. difficile colitis -Diarrhea improving.  Per ID recommendation, currently on prolonged course of oral vancomycin because of use of IV antibiotics.  Recommended to continue oral vancomycin for the duration of antibiotics +1 more week.   -Continue probiotics, low-dose Lomotil. -4/2, A dose of Dificid was given 02/05/2021: Slowly improving. 02/06/2021: Diarrhea has improved significantly.  Intractable abdominal pain -Due to colitis, intra-abdominal abscesses, EC fistula, intra-abdominal cancer -Currently being controlled with fentanyl patch, IV Dilaudid, oral Dilaudid. -I discussed with patient to cut back on her opioids.  We will stop IV Dilaudid at this time and continue with fentanyl patch and as needed oral Dilaudid  History of metastatic appendiceal cancer, pseudomyxoma peritonei and peritoneal carcinomatosis status post CRS/HI PEC and splenectomy in 2005 and recently CRS/HI PEC on 12/10/2020 for recurrent cancer -Patient sees Dr. [name] here in town but most of her cancer care was done at [facility].  -Will need to follow up at D/C with Surgeon in [facility] or Specialist at [facility]  Acute DVT of right common femoral vein, right popliteal vein, right posterior tibial vein and right peroneal veins, supra mesenteric vein, portal vein and iliac vein - 4/12 History of DVT and PE with IVC filter  -Was on prophylactic dose of Lovenox at home.  Switch to heparin drip.  Subsequently switched to oral Xarelto today.  Orthostatic hypotension EssentialHypertension -Patient is having orthostatic hypotension with dizziness despite of elevated baseline blood pressure in 140s and 150s.  This morning, blood pressure dropped from 143/79  to 111/78 on standing up.   -For now we will continue propranolol 20 mg twice daily  but keep amlodipine on hold. -Continue to monitor blood pressure.  History of autoimmune Hepatitis, liver cirrhosis -Patient was on azathioprine for a year before it was stopped in February 2022 prior to the surgery.  Continue to hold.  I would defer to her surgeons in [place] to determine on resuming it once the intra-abdominal infection is controlled. -LFTs currently normal.  Essential Tremor -Continue Propranolol 20 mg po BID  Anxiety -C/w Buspirone 5 mg p.o. daily with breakfast, 5 mg p.o. daily, and then 10 mg p.o. nightly. -May need Psych to see given Depression due to situation  Type 2 Diabetes Mellitus  -A1c 5.8 on 3/26. -Diet controlled.  Hypokalemia -Potassium level significantly low at 2.9 today.  Oral and IV replacement ordered.  Recheck tomorrow. 02/05/2021: Potassium today 3.7. 02/06/2021: Potassium is 3.2 today.  Continue to monitor and replete. Recent Labs  Lab 02/02/21 0515 02/03/21 0215 02/04/21 0545 02/05/21 0538 02/06/21 0536 02/07/21 1035  K 3.5 3.4* 2.9* 3.7 3.2* 3.6  MG 1.9 1.8  --   --   --  1.8  PHOS 3.3 3.3  --   --   --  3.9   Chronic normocytic anemia -Hemoglobin stable between 10-11.    Mobility: Encourage ambulation.  PT eval obtained. No f/u recommended. Code Status:   Code Status: Full Code  Nutritional status: Body mass index is 37.11 kg/m. Nutrition Problem: Increased nutrient needs Etiology: cancer and cancer related treatments Signs/Symptoms: estimated needs Diet Order            Diet Heart Room  service appropriate? Yes; Fluid consistency: Thin  Diet effective now                 DVT prophylaxis: Place TED hose Start: 01/31/21 1856   Antimicrobials:  Metronidazole IV, cefdinir oral, vancomycin oral Fluid: Normal saline 75 mL/h for orthostatic hypotension Consultants: Oncology, ID, surgery, IR Family Communication:  None at bedside.  Spoke to husband on the phone  Status is: Inpatient  Remains inpatient appropriate  because: Pending CT scan of abdomen and pelvis to evaluate for drain management.  If no new significant finding on CT abdomen and if pain tolerable on oral medications, can be discharged home in next 1 to 2 days.  Dispo: The patient is from: Home              Anticipated d/c is to: Home with home health RN              Patient currently is not medically stable to d/c.   Difficult to place patient No       Infusions:    Scheduled Meds: . busPIRone  5 mg Oral Q breakfast   And  . busPIRone  5 mg Oral Q1200   And  . busPIRone  10 mg Oral QHS  . cefdinir  300 mg Oral Q12H  . famotidine  40 mg Oral Daily  . fentaNYL  1 patch Transdermal Q72H  . metroNIDAZOLE  500 mg Oral Q12H  . multivitamin with minerals  1 tablet Oral Daily  . polycarbophil  625 mg Oral Daily  . propranolol  20 mg Oral BID  . Rivaroxaban  15 mg Oral BID WC   Followed by  . [START ON 02/25/2021] rivaroxaban  20 mg Oral Q supper  . saccharomyces boulardii  250 mg Oral BID  . scopolamine  1 patch Transdermal Q72H  . sodium chloride flush  3 mL Intravenous Q12H  . sodium chloride flush  5 mL Intracatheter Q8H  . vancomycin  125 mg Oral Q12H    Antimicrobials: Anti-infectives (From admission, onward)   Start     Dose/Rate Route Frequency Ordered Stop   02/08/21 2200  metroNIDAZOLE (FLAGYL) tablet 500 mg        500 mg Oral Every 12 hours 02/08/21 1617     02/01/21 2200  metroNIDAZOLE (FLAGYL) IVPB 500 mg  Status:  Discontinued        500 mg 100 mL/hr over 60 Minutes Intravenous Every 12 hours 02/01/21 1804 02/08/21 1617   01/30/21 1000  vancomycin (VANCOCIN) 50 mg/mL oral solution 125 mg        125 mg Oral Every 12 hours 01/29/21 1431     01/29/21 2200  metroNIDAZOLE (FLAGYL) tablet 500 mg  Status:  Discontinued        500 mg Oral Every 12 hours 01/29/21 1700 02/01/21 1804   01/29/21 1800  metroNIDAZOLE (FLAGYL) tablet 500 mg  Status:  Discontinued        500 mg Oral Every 8 hours 01/29/21 1431 01/29/21 1700    01/29/21 1530  cefdinir (OMNICEF) capsule 300 mg        300 mg Oral Every 12 hours 01/29/21 1431     01/28/21 1800  metroNIDAZOLE (FLAGYL) IVPB 500 mg  Status:  Discontinued        500 mg 100 mL/hr over 60 Minutes Intravenous Every 8 hours 01/28/21 1517 01/29/21 1431   01/28/21 1600  ciprofloxacin (CIPRO) IVPB 400 mg  Status:  Discontinued  400 mg 200 mL/hr over 60 Minutes Intravenous Every 12 hours 01/28/21 1517 01/29/21 1431   01/23/21 1315  vancomycin (VANCOCIN) 50 mg/mL oral solution 125 mg        125 mg Oral Every 6 hours 01/23/21 1226 01/29/21 2359   01/23/21 1000  fidaxomicin (DIFICID) tablet 200 mg  Status:  Discontinued        200 mg Oral 2 times daily 01/23/21 0859 01/23/21 1226   01/17/21 0600  vancomycin (VANCOREADY) IVPB 1500 mg/300 mL  Status:  Discontinued        1,500 mg 150 mL/hr over 120 Minutes Intravenous Every 24 hours 01/16/21 1732 01/17/21 1216   01/16/21 2200  metroNIDAZOLE (FLAGYL) IVPB 500 mg  Status:  Discontinued        500 mg 100 mL/hr over 60 Minutes Intravenous Every 8 hours 01/16/21 1622 01/17/21 1216   01/16/21 2000  vancomycin (VANCOCIN) 50 mg/mL oral solution 125 mg  Status:  Discontinued        125 mg Oral 4 times daily 01/16/21 1805 01/23/21 0859   01/16/21 1800  ceFEPIme (MAXIPIME) 2 g in sodium chloride 0.9 % 100 mL IVPB  Status:  Discontinued        2 g 200 mL/hr over 30 Minutes Intravenous Every 8 hours 01/16/21 1728 01/17/21 1216   01/16/21 1545  vancomycin (VANCOCIN) 50 mg/mL oral solution 125 mg  Status:  Discontinued        125 mg Oral 4 times daily 01/16/21 1539 01/16/21 1622   01/16/21 1445  metroNIDAZOLE (FLAGYL) IVPB 500 mg        500 mg 100 mL/hr over 60 Minutes Intravenous  Once 01/16/21 1438 01/16/21 1622   01/16/21 1330  vancomycin (VANCOCIN) IVPB 1000 mg/200 mL premix        1,000 mg 200 mL/hr over 60 Minutes Intravenous  Once 01/16/21 1320 01/16/21 1538      PRN meds: acetaminophen, calcium carbonate, diclofenac  Sodium, Gerhardt's butt cream, hydrALAZINE, HYDROmorphone, ondansetron **OR** ondansetron (ZOFRAN) IV, simethicone   Objective: Vitals:   02/08/21 1000 02/08/21 1353  BP: (!) 154/97 119/88  Pulse:  78  Resp:  18  Temp:  98 F (36.7 C)  SpO2:  90%    Intake/Output Summary (Last 24 hours) at 02/08/2021 1618 Last data filed at 02/08/2021 0600 Gross per 24 hour  Intake 10 ml  Output 115 ml  Net -105 ml   Filed Weights   01/16/21 1700 02/04/21 0618 02/05/21 0700  Weight: 102.1 kg 103.9 kg 104.3 kg   Weight change:  Body mass index is 37.11 kg/m.   Physical Exam: General exam: Pleasant is not in any distress.   HEENT: Patient has pallor.  No jaundice.   Lungs: Clear to auscultation bilaterally CVS: S1-S2.   GI/Abd soft, patient has intra-abdominal drain in place CNS: Alert, awake, oriented x3.  Patient moves all extremities. Extremities: No pedal edema.  Data Review: I have personally reviewed the laboratory data and studies available.  Recent Labs  Lab 02/02/21 0515 02/03/21 0215 02/04/21 0545  WBC 7.9 8.8 8.5  NEUTROABS 2.6 3.2  --   HGB 10.2* 10.9* 10.5*  HCT 33.4* 34.7* 33.1*  MCV 100.0 99.1 97.6  PLT 251 385 351   Recent Labs  Lab 02/02/21 0515 02/03/21 0215 02/04/21 0545 02/05/21 0538 02/06/21 0536 02/07/21 1035  NA 137 139 139 140 141 140  K 3.5 3.4* 2.9* 3.7 3.2* 3.6  CL 107 107 107 106 106 105  CO2  23 24 25 28 25 27   GLUCOSE 107* 116* 112* 109* 121* 116*  BUN 6 5* <5* <5* 6 8  CREATININE 0.45 0.61 0.47 0.56 0.55 0.54  CALCIUM 8.2* 8.4* 8.5* 8.6* 8.8* 8.6*  MG 1.9 1.8  --   --   --  1.8  PHOS 3.3 3.3  --   --   --  3.9    F/u labs ordered Unresulted Labs (From admission, onward)          Start     Ordered   01/16/21 1201  CBC with Differential/Platelet  Once,   R        01/16/21 1201          Signed, Bonnell Public, MD [facility] 02/08/2021

## 2021-02-08 NOTE — Progress Notes (Signed)
Wendy Barber is about the same.  She is having pain issues.  She is still taking the Dilaudid every 4 or 5 hours.  She is on the Duragesic patch.  She is on Xarelto for the thrombus in her right leg.  I really do not think that she needs a compression stocking.  There really is no swelling.  There is very little output in the drain.  Hopefully, this will be able to be discontinued at some point.  I think she is still taking Omnicef.  This is for the Klebsiella in her fluid.  She is also on oral vancomycin.  She is also taking Flagyl.  I just wonder if she needs all of these when she goes home.  She has had no fever.  She is out of bed a little bit.  Her labs were not done today.  I will make sure she has lab work done tomorrow.  Her vital signs show temperature of 98.  Pulse 78.  Blood pressure 119/88.  Her head and neck exam shows no thrush.  There is no adenopathy in the neck.  Lungs are clear bilaterally.  Cardiac exam regular rate and rhythm.  Abdomen is soft.  She has a dressing on the lower abdomen.  She has a drainage catheter coming out of the dressing.  She has decent bowel sounds.  There is no guarding or rebound tenderness.  Extremities shows no swelling in the legs.  No venous cords noted in the right leg.  Neurological exam shows no focal neurological deficits.  Hopefully, she will be going home soon.  Would like to think that she should can be going home this week.  She clearly needs to follow-up with her surgeon up in [place].  She really would like to stay locally for surgical issues.  She will need Xarelto for life.  She has had past thromboembolic events.  Hopefully, we can have her a lower dose of Xarelto.  I appreciate all the great help that she has had from the staff up on [address].

## 2021-02-08 NOTE — Progress Notes (Signed)
Referring Physician(s): Dr. [name], P.   Supervising Physician: Arne Cleveland  Patient Status:  [facility] - In-pt  Chief Complaint:  S/p intraabdominal abscess drain placement with Dr. [name] on 01/27/21 S/p repositioning of the existing drain with Dr. Laurence Ferrari on 02/02/21    Subjective:  Patient is doing fine this morning, has no abdominal pain.  Reports loose stool. Reports she might be discharged soon.   Allergies: Penicillins, Alprazolam, Ativan [lorazepam], Corticosteroids, Erythromycin, Erythromycin base, Milnacipran hcl, Prednisone, Savella  [milnacipran], Povidone iodine, and Prednisolone  Medications: Prior to Admission medications   Medication Sig Start Date End Date Taking? Authorizing Provider  busPIRone (BUSPAR) 5 MG tablet Take 5 mg by mouth See admin instructions. Takes 1 tablet in the morning, 1 tablet in the afternoon and 2 tablets at night   Yes [provider]  diphenoxylate-atropine (LOMOTIL) 2.5-0.025 MG tablet Take 1 tablet by mouth daily as needed for diarrhea or loose stools. 01/15/21  Yes [provider]  enoxaparin (LOVENOX) 40 MG/0.4ML injection Inject 40 mg into the skin daily. 01/01/21  Yes [provider]  HYDROmorphone (DILAUDID) 2 MG tablet Take 2 mg by mouth every 4 (four) hours as needed for moderate pain. 01/08/21  Yes [provider]  loperamide (IMODIUM) 2 MG capsule Take 4 mg by mouth in the morning, at noon, in the evening, and at bedtime. 01/01/21  Yes [provider]  ondansetron (ZOFRAN) 8 MG tablet Take 8 mg by mouth every 8 (eight) hours as needed for nausea or vomiting. 02/26/20  Yes [provider]  pantoprazole (PROTONIX) 40 MG tablet Take 40 mg by mouth every morning. 01/01/21  Yes [provider]  diazepam (VALIUM) 5 MG tablet Take 40 minutes prior to MRI Patient not taking: No sig reported 07/16/20   Volanda Napoleon, MD  insulin glargine (LANTUS) 100 UNIT/ML injection Inject 0.1  mLs (10 Units total) into the skin at bedtime. Before bed Patient not taking: No sig reported 11/26/19   Nita Sells, MD  insulin lispro (HUMALOG) 100 UNIT/ML injection Inject 0.08 mLs (8 Units total) into the skin 3 (three) times daily before meals. Patient not taking: No sig reported 11/26/19   Nita Sells, MD     Vital Signs: BP (!) 187/98 (BP Location: Left Arm)   Pulse 68   Temp 98.7 F (37.1 C) (Oral)   Resp 17   Ht 5\' 6"  (1.676 m)   Wt 229 lb 15 oz (104.3 kg)   SpO2 91%   BMI 37.11 kg/m   Physical Exam Vitals and nursing note reviewed.  Constitutional:      General: She is not in acute distress.    Appearance: Normal appearance. She is not ill-appearing.  Cardiovascular:     Rate and Rhythm: Normal rate.  Pulmonary:     Effort: Pulmonary effort is normal.  Abdominal:     Palpations: Abdomen is soft.     Comments: Positive midline drain to a gravity bag. Site is unremarkable with no erythema, edema, tenderness, bleeding or drainage. Suture and stat lock in place. Dressing is clean, dry, and intact. Trace of yellow, thick, blood tinged fluid noted in the gravity bag. Drain aspirates and flushes well.   Neurological:     Mental Status: She is alert and oriented to person, place, and time.     Imaging: CT ABDOMEN PELVIS W CONTRAST  Result Date: 02/04/2021 CLINICAL DATA:  Intra-abdominal abscess, status post percutaneous drain, enterocutaneous fistula EXAM: CT ABDOMEN AND PELVIS  WITH CONTRAST TECHNIQUE: Multidetector CT imaging of the abdomen and pelvis was performed using the standard protocol following bolus administration of intravenous contrast. CONTRAST:  136mL OMNIPAQUE IOHEXOL 300 MG/ML  SOLN COMPARISON:  02/01/2021 FINDINGS: Lower chest: Minor right basilar atelectasis versus scarring. Normal heart size. No pericardial or pleural effusion. No acute lower chest finding. Hepatobiliary: Similar hepatic cirrhotic changes with surface nodularity over dome  liver. No large focal hepatic abnormality biliary dilatation. Remote cholecystectomy. Common bile duct nondilated. Pancreas: Diffuse pancreatic thinning/atrophy. No acute process or surrounding inflammation. No ductal dilatation. Spleen: Remote splenectomy. Adrenals/Urinary Tract: Normal adrenal glands. No acute renal abnormality or focal lesion. No obstruction, hydronephrosis, hydroureter or ureteral calculus. Bladder unremarkable. Stomach/Bowel: Similar extensive postoperative changes of the bowel with multiple loops of bowel in close proximity to the anterior abdominal wall but without obstruction pattern, significant dilatation, ileus or free air. Interval reposition of the drain catheter now within the inferior aspect of the subcutaneous abdominal wall fluid collection. Compared to the prior study, the fluid collection has improved with only a minimal amount of fluid and air along the presumed known enterocutaneous fistula. No new abdominal or pelvic fluid collection, abscess, ascites, hemorrhage, or hematoma. Vascular/Lymphatic: Tortuosity of the aorta and iliac vessels without aneurysm or occlusive process. Mesenteric and renal arterial vasculature all appear patent. Chronic IVC filter with filter legs penetrating the IVC as before. Similar hypodense filling defect within the main portal vein extending into the SMV compatible with portal mesenteric venous thrombus. Intrahepatic portal branches appear small but patent. No interval propagation of clot. Reproductive: Status post hysterectomy. No adnexal masses. Other: No abdominal wall hernia or abnormality. No abdominopelvic ascites. Musculoskeletal: Postop fusion of the lumbar spine. Associated degenerative changes as well. No acute osseous finding. Mild associated scoliosis. IMPRESSION: Interval reposition of the anterior abdominal wall/fistula drain now within the subcutaneous lower abdominal wall with overall improvement of the residual abdominal wall fluid  collection. Only trace amount of fluid and air along the known enterocutaneous fistula. No new fluid collection or abscess that warrants drainage. Chronic hepatic cirrhosis. Similar nonocclusive thrombus within the main portal vein and superior mesenteric vein. No interval propagation compared to 02/01/2021. Other chronic and postsurgical findings are stable. Electronically Signed   By: Jerilynn Mages.  Shick M.D.   On: 02/04/2021 16:42    Labs:  CBC: Recent Labs    02/01/21 0600 02/02/21 0515 02/03/21 0215 02/04/21 0545  WBC 9.4 7.9 8.8 8.5  HGB 10.4* 10.2* 10.9* 10.5*  HCT 33.1* 33.4* 34.7* 33.1*  PLT 394 251 385 351    COAGS: Recent Labs    02/02/21 0515  INR 1.2    BMP: Recent Labs    03/19/20 1109 05/01/20 1156 07/27/20 0922 10/28/20 0956 02/04/21 0545 02/05/21 0538 02/06/21 0536 02/07/21 1035  NA 143 141 143   < > 139 140 141 140  K 3.6 3.7 4.1   < > 2.9* 3.7 3.2* 3.6  CL 105 104 103   < > 107 106 106 105  CO2 30 27 33*   < > 25 28 25 27   GLUCOSE 133* 102* 160*   < > 112* 109* 121* 116*  BUN 16 21* 19   < > <5* <5* 6 8  CALCIUM 10.2 10.5* 10.4*   < > 8.5* 8.6* 8.8* 8.6*  CREATININE 0.78 1.05* 0.83   < > 0.47 0.56 0.55 0.54  GFRNONAA >60 59* >60   < > >60 >60 >60 >60  GFRAA >60 >60 >60  --   --   --   --   --    < > =  values in this interval not displayed.    LIVER FUNCTION TESTS: Recent Labs    01/31/21 0607 02/01/21 0600 02/02/21 0515 02/03/21 0215 02/07/21 1035  BILITOT 0.7 0.6 0.7 0.6  --   AST 35 26 30 31   --   ALT 14 13 13 14   --   ALKPHOS 269* 277* 253* 269*  --   PROT 6.7 6.4* 6.4* 6.6  --   ALBUMIN 2.4* 2.3* 2.3* 2.4* 2.4*    Assessment and Plan:  S/p intraabdominal abscess drain placement with Dr. [name] on 01/27/21 S/p repositioning of the existing drain with Dr. Laurence Ferrari on 02/02/21    Patient stable.  Afebrile, no CBC obtained since 4/14.  Trace of yellow, thick, and slightly blood tinged fluid in the gravity bag.  Overnight OP 115 cc.   Drain and dressing clean, dry, intact.  Patient states that she might be discharged soon.   Continue with flushing TID, output recording q shift and dressing changes as needed. Would consider additional imaging when output is less than 10 ml for 24 hours not including flush material.   If patient were to be disharged with the drain in place, our clinic will call patient to set up appointment for CT and drain injection at the clinic once the output is less than 10 ml for 24 hours. Will place outpatient follow up order.     Further treatment plan per TRH/Oncology Appreciate and agree with the plan.  IR to follow.   Electronically Signed: Tera Mater, PA-C 02/08/2021, 9:22 AM   I spent a total of 15 Minutes at the the patient's bedside AND on the patient's hospital floor or unit, greater than 50% of which was counseling/coordinating care for midline abscess drain

## 2021-02-09 ENCOUNTER — Other Ambulatory Visit: Payer: Self-pay | Admitting: Radiology

## 2021-02-09 DIAGNOSIS — Z978 Presence of other specified devices: Secondary | ICD-10-CM

## 2021-02-09 DIAGNOSIS — Z880 Allergy status to penicillin: Secondary | ICD-10-CM

## 2021-02-09 LAB — COMPREHENSIVE METABOLIC PANEL
ALT: 16 U/L (ref 0–44)
AST: 37 U/L (ref 15–41)
Albumin: 2.7 g/dL — ABNORMAL LOW (ref 3.5–5.0)
Alkaline Phosphatase: 220 U/L — ABNORMAL HIGH (ref 38–126)
Anion gap: 9 (ref 5–15)
BUN: 7 mg/dL (ref 6–20)
CO2: 27 mmol/L (ref 22–32)
Calcium: 9 mg/dL (ref 8.9–10.3)
Chloride: 105 mmol/L (ref 98–111)
Creatinine, Ser: 0.64 mg/dL (ref 0.44–1.00)
GFR, Estimated: >60 mL/min (ref >60)
Glucose, Bld: 127 mg/dL — ABNORMAL HIGH (ref 70–99)
Potassium: 3.1 mmol/L — ABNORMAL LOW (ref 3.5–5.1)
Sodium: 141 mmol/L (ref 135–145)
Total Bilirubin: 0.6 mg/dL (ref 0.3–1.2)
Total Protein: 7 g/dL (ref 6.5–8.1)

## 2021-02-09 LAB — CBC
HCT: 38 % (ref 36.0–46.0)
Hemoglobin: 12.1 g/dL (ref 12.0–15.0)
MCH: 31.7 pg (ref 26.0–34.0)
MCHC: 31.8 g/dL (ref 30.0–36.0)
MCV: 99.5 fL (ref 80.0–100.0)
Platelets: 359 10*3/uL (ref 150–400)
RBC: 3.82 MIL/uL — ABNORMAL LOW (ref 3.87–5.11)
RDW: 20.1 % — ABNORMAL HIGH (ref 11.5–15.5)
WBC: 8.2 10*3/uL (ref 4.0–10.5)
nRBC: 0 % (ref 0.0–0.2)

## 2021-02-09 LAB — MAGNESIUM: Magnesium: 1.9 mg/dL (ref 1.7–2.4)

## 2021-02-09 MED ORDER — POTASSIUM CHLORIDE 10 MEQ/100ML IV SOLN
10.0000 meq | INTRAVENOUS | Status: AC
  Administered 2021-02-09 (×4): 10 meq via INTRAVENOUS
  Filled 2021-02-09 (×2): qty 100

## 2021-02-09 MED ORDER — SODIUM CHLORIDE 0.9 % IV SOLN
12.5000 mg | Freq: Four times a day (QID) | INTRAVENOUS | Status: DC | PRN
  Administered 2021-02-09: 12.5 mg via INTRAVENOUS
  Filled 2021-02-09 (×2): qty 0.5

## 2021-02-09 MED ORDER — METRONIDAZOLE IN NACL 5-0.79 MG/ML-% IV SOLN: 500.0000 mg | Freq: Three times a day (TID) | INTRAVENOUS | Status: DC

## 2021-02-09 MED ORDER — PROMETHAZINE HCL 25 MG PO TABS: 12.5000 mg | ORAL_TABLET | Freq: Once | ORAL | Status: DC

## 2021-02-09 MED ORDER — LACTATED RINGERS IV BOLUS
500.0000 mL | Freq: Once | INTRAVENOUS | Status: AC
  Administered 2021-02-09: 500 mL via INTRAVENOUS

## 2021-02-09 NOTE — Progress Notes (Signed)
[facility] for Infectious Disease  Date of Admission:  01/16/2021           Reason for visit: Follow up on postsurgical abdominal wound infection  Current antibiotics: Cefdinir and Flagyl Oral vancomycin  ASSESSMENT:    1. Post surgical abdominal wound infection with history of anastomotic leak/surgical site infection and intra-abdominal and abdominal wall abscess with probable fistula status post abdominal drain placement by interventional radiology 01/27/2021 with repositioning of this existing drain on 02/02/2021.  She was previously seen by Dr. Gale Journey on 01/29/2021 who had recommended metronidazole and cefdinir until 03/12/2021 for treatment of Klebsiella pneumonia isolated on 01/27/2021 abdominal abscess cultures (no anaerobes isolated).  She has been nearing hospital discharge, however, was recently transitioned back to oral metronidazole which she did not tolerate due to nausea and vomiting.  We have been asked to reevaluate for updated antibiotic recommendations due to this.  She had a repeat CT scan which showed the anterior abdominal wall/fistula drain was within the subcutaneous lower abdominal wall with overall improvement in the residual fluid collection with only trace amount of fluid and air along the known enterocutaneous fistula with no new fluid collections or abscesses that would warrant drainage.  Drain output over the last 24 hours has been about 25 cc. 2. C. difficile: Currently on prophylactic vancomycin p.o. 125 mg twice daily after completing treatment dose of oral vancomycin on 01/29/2021 3. Penicillin allergy: Reaction include shortness of breath  PLAN:    . Continue cefdinir p.o. 300 mg twice daily. . Due to her intolerance of metronidazole and lack of anaerobes isolated on drain cultures will discontinue at this time as she has now received approximately 2 weeks of anaerobic coverage.  She has a penicillin allergy which prohibits the use of Augmentin in this  scenario . Given improved CT scan from 02/04/2021 anticipate shortening her duration of therapy.  Would continue cefdinir through her scheduled ID follow-up appointment on 02/18/2021 (3 weeks post drain placement) and then anticipate hopefully stopping her antibiotics at that point.  She will probably continue to have some drainage through her EC fistula, however, I am not sure this warrants further antibiotic therapy given the resolving fluid collections and ideally we would avoid prolonged course of antibiotics as previously suggested . Continue prophylactic vancomycin as previously recommended by Dr Gale Journey for 1 week after stopping cefdinir . Dr Gale Journey has already made her an appt with Dr Linus Salmons for follow up on 02/18/21.  Will have her keep this appointment and decide if any further therapy is needed beyond that time frame . Please call as needed, but will plan to sign off for now.    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 difficile colitis   Abdominal wall abscess   Hypokalemia    MEDICATIONS:    Scheduled Meds: . busPIRone  5 mg Oral Q breakfast   And  . busPIRone  5 mg Oral Q1200   And  . busPIRone  10 mg Oral QHS  . cefdinir  300 mg Oral Q12H  . famotidine  40 mg Oral Daily  . fentaNYL  1 patch Transdermal Q72H  . multivitamin  with minerals  1 tablet Oral Daily  . polycarbophil  625 mg Oral Daily  . propranolol  20 mg Oral BID  . Rivaroxaban  15 mg Oral BID WC   Followed by  . [START ON 02/25/2021] rivaroxaban  20 mg Oral Q supper  . saccharomyces boulardii  250 mg Oral BID  . scopolamine  1 patch Transdermal Q72H  . sodium chloride flush  3 mL Intravenous Q12H  . sodium  chloride flush  5 mL Intracatheter Q8H  . vancomycin  125 mg Oral Q12H   Continuous Infusions: . potassium chloride 10 mEq (02/09/21 1529)  . promethazine (PHENERGAN) injection (IM or IVPB) 12.5 mg (02/09/21 1058)   PRN Meds:.acetaminophen, calcium carbonate, diclofenac Sodium, Gerhardt's butt cream, hydrALAZINE, HYDROmorphone, ondansetron **OR** ondansetron (ZOFRAN) IV, promethazine (PHENERGAN) injection (IM or IVPB), simethicone  SUBJECTIVE:   24 hour events:  Reconsulted due to intolerance of Flagyl and need for updated antibiotic recommendations  Patient without acute complaints today.  She reports nausea and vomiting shortly after resuming oral Flagyl on 4/18.  This is started to improve with Phenergan that is she is receiving.  She has no other acute complaints.  Review of Systems  Constitutional: Negative for chills and fever.  Respiratory: Negative.   Cardiovascular: Negative.   Gastrointestinal: Positive for nausea and vomiting.      OBJECTIVE:   Blood pressure 134/72, pulse 96, temperature 98.1 F (36.7 C), temperature source Oral, resp. rate 18, height 5\' 6"  (1.676 m), weight 104.3 kg, SpO2 97 %. Body mass index is 37.11 kg/m.  Physical Exam Constitutional:      General: She is not in acute distress.    Appearance: Normal appearance.  Pulmonary:     Effort: Pulmonary effort is normal. No respiratory distress.  Neurological:     General: No focal deficit present.     Mental Status: She is alert and oriented to person, place, and time.  Psychiatric:        Mood and Affect: Mood normal.        Behavior: Behavior normal.      Lab Results: Lab Results  Component Value Date   WBC 8.2 02/09/2021   HGB 12.1 02/09/2021   HCT 38.0 02/09/2021   MCV 99.5 02/09/2021   PLT 359 02/09/2021    Lab Results  Component Value Date   NA 141 02/09/2021   K 3.1 (L) 02/09/2021   CO2 27 02/09/2021   GLUCOSE 127 (H) 02/09/2021   BUN 7 02/09/2021   CREATININE 0.64  02/09/2021   CALCIUM 9.0 02/09/2021   GFRNONAA >60 02/09/2021   GFRAA >60 07/27/2020    Lab Results  Component Value Date   ALT 16 02/09/2021   AST 37 02/09/2021   ALKPHOS 220 (H) 02/09/2021   BILITOT 0.6 02/09/2021    No results found for: CRP  No results found for: ESRSEDRATE   I have reviewed the micro and lab results in Epic.  Imaging: IMPRESSION: Interval reposition of the anterior abdominal wall/fistula drain now within the subcutaneous lower abdominal wall with overall improvement of the residual abdominal wall fluid collection. Only trace amount of fluid and air along the known enterocutaneous fistula.  No new fluid collection or abscess that warrants drainage.  Imaging independently reviewed in Epic.    [facility] for Infectious [facility] [place] [phone] pager 02/09/2021, 3:30 PM  I spent greater than 35 minutes with the patient including greater than 50% of time in face  to face counsel of the patient and in coordination of their care.

## 2021-02-09 NOTE — Progress Notes (Signed)
PROGRESS NOTE    Wendy Barber    Code Status: Full Code  WPV:[id number] DOB: 16-May-1961 DOA: 01/16/2021 LOS: 24 days  PCP: Glendon Axe, MD CC:  Chief Complaint  Patient presents with  . Diarrhea  . Wound Check       Hospital Summary   This is a 60 year old female with a past medical history of metastatic appendiceal cancer, pseudomyxoma peritonei and peritoneal carcinomatosis s/p CRS/HIPEC and splenectomy in 2005 and recently CRS/HIPEC on 12/10/2020 for recurrent cancer (see H&P on 3/26 by Dr. Neysa Bonito for further surgical details), autoimmune hepatitis, DVT and PE with IVC filter currently on Lovenox, hypertension, diabetes who presented to the ED with multiple complaints including intractable diarrhea since her procedure, fatigue and wound drainage for 1 day.  ED Course: Afebrile, hypertensive, on room air. Notable Labs: Sodium 140, K2.9, glucose 122, BUN 9, creatinine 0.39, alk phos 185, lipase 34, AST 20, ALT 14, lactic acid 1.8, WBC 9.4, Hb 10.0, C. difficile positive, COVID-19 pending. Notable Imaging: CT abdomen pelvis with contrast-gas and fluid collection within the anterior abdominal wall ventral surgical wound measuring 3.2 x 1.8 x 13 cm concerning for wound infection, additional loculated fluid collection in the pelvis superior and anterior to the urinary bladder measuring 5 x 5 x 2 0.6 x 3.8 cm possible sterile versus infected postoperative collection, scattered infiltrative changes of mesenteric and abdominal fat either related to surgery though cannot exclude involvement of peritoneal tumor, diffuse colonic and small bowel wall thickening which could be related to surgery though enteritis is not excluded, possible cirrhosis. Patient received fentanyl, Dilaudid, Zofran, KCl p.o, vancomycin IV, metronidazole, 2 L NS bolus.  General surgery was consulted by the ED provider  She has since been started on oral vancomycin for C. difficile.  She is s/p IR drain placed on 4/6 for an  abdominal wall abscess which grew Klebsiella pneumonia and Acinetobacter species.  She has been on cefdinir and IV Flagyl (poorly tolerated p.o Flagyl).  She was found to have an enterocutaneous fistula on 4/12 repeat CT however general surgery recommended outpatient follow-up with her general surgeons in [place] and she did not undergo any further treatment of this.  Additionally, she was found to have an acute DVT of her right common femoral, popliteal, posterior tibial and peroneal veins and superior mesenteric vein, portal vein and iliac vein on 4/12 and has a history of IVC filter.  Her Lovenox was switched to a heparin drip and now on Xarelto.  Plan was initially to discharge on 4/19 however she has had worsening nausea, vomiting and some diarrhea and so her p.o. Flagyl was discontinued at the recommendation of ID which was thought to be contributing to her symptoms.  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 difficile colitis   Abdominal wall abscess   Hypokalemia   1. Postsurgical abdominal wound infection  history of anastomotic leak/surgical site infection abdominal wound  intra-abdominal and abdominal wall abscess with probable fistula  s/p abdominal drain a. Previous superficial swab of wound dehiscence growing Acinetobacter/Candida on 3/26 but not present in the abdominal wall abscess from 4/6 drain culture b. Klebsiella pneumonia noted  on culture 4/6 from drain c. Continue cefdinir 300 mg p.o. twice daily -> continue until 5/20 d. Flagyl IV changed to p.o. yesterday and has had worsening GI symptoms, likely does not tolerate this.  Discussed with Dr. Juleen China, ID who  recommends discontinuing Flagyl at this time  2. C. difficile colitis, slowly improving a. Received a dose of Dificid on 4/2 b. On probiotics c. Continue p.o. vancomycin until 03/19/2021  3. Intra-abdominal pain, multifactorial: Colitis, abscesses, fistula, abdominal cancer a. Continue current pain regimen with fentanyl b. Follow-up with her general surgeon in [place] at discharge  4. Acute DVT of the right common femoral, popliteal, posterior tibial, peroneal, superior mesenteric, portal and iliac veins with history of DVT and PE with IVC filter a. VTE despite continuation of home Lovenox dose. Lovenox -> Heparin drip -> Xarelto b. Continue Xarelto  5. Orthostatic hypotension  hypertension a. Continue home propranolol and holding home amlodipine b. Will give gentle IV fluids today given her nausea and vomiting  6. Essential tremor a. Continue home propranolol  7. Anxiety a. Continue home BuSpar  8. Type 2 diabetes a. Diet controlled  9. Hypokalemia a. Will replace IV  10. Chronic normocytic anemia, stable   11. History of metastatic appendiceal cancer, pseudomyxoma peritonei and peritoneal carcinomatosis s/p CRS/HI PEC and splenectomy in 2005 and recently CRS/HI PEC on 12/10/2020 for recurrent cancer a. Has followed with oncology during her hospitalization b. Should follow-up with her oncologist in [place] after discharge  DVT prophylaxis: Place TED hose Start: 01/31/21 1856 Rivaroxaban (XARELTO) tablet 15 mg  rivaroxaban (XARELTO) tablet 20 mg   Family Communication: Patient's husband has been updated   Disposition Plan: hopefully she can be discharged in the next 24-48 hours if her GI upset has improved and she is able to tolerate p.o. intake Status is: Inpatient  Remains inpatient appropriate because:IV treatments appropriate due to intensity of illness or inability to take PO and Inpatient level of care appropriate due to severity of illness   Dispo: The  patient is from: Home              Anticipated d/c is to: Home              Patient currently is not medically stable to d/c.   Difficult to place patient No           Pressure injury documentation    None  Consultants  General surgery IR Oncology ID    Procedures  IR drain on 01/27/2021   Antibiotics   Anti-infectives (From admission, onward)   Start     Dose/Rate Route Frequency Ordered Stop   02/09/21 1200  metroNIDAZOLE (FLAGYL) IVPB 500 mg  Status:  Discontinued        500 mg 100 mL/hr over 60 Minutes Intravenous Every 8 hours 02/09/21 1101 02/09/21 1200   02/08/21 2200  metroNIDAZOLE (FLAGYL) tablet 500 mg  Status:  Discontinued        500 mg Oral Every 12 hours 02/08/21 1617 02/09/21 1101   02/01/21 2200  metroNIDAZOLE (FLAGYL) IVPB 500 mg  Status:  Discontinued        500 mg 100 mL/hr over 60 Minutes Intravenous Every 12 hours 02/01/21 1804 02/08/21 1617   01/30/21 1000  vancomycin (VANCOCIN) 50 mg/mL oral solution 125 mg        125 mg Oral Every 12 hours 01/29/21 1431     01/29/21 2200  metroNIDAZOLE (FLAGYL) tablet 500 mg  Status:  Discontinued  500 mg Oral Every 12 hours 01/29/21 1700 02/01/21 1804   01/29/21 1800  metroNIDAZOLE (FLAGYL) tablet 500 mg  Status:  Discontinued        500 mg Oral Every 8 hours 01/29/21 1431 01/29/21 1700   01/29/21 1530  cefdinir (OMNICEF) capsule 300 mg        300 mg Oral Every 12 hours 01/29/21 1431     01/28/21 1800  metroNIDAZOLE (FLAGYL) IVPB 500 mg  Status:  Discontinued        500 mg 100 mL/hr over 60 Minutes Intravenous Every 8 hours 01/28/21 1517 01/29/21 1431   01/28/21 1600  ciprofloxacin (CIPRO) IVPB 400 mg  Status:  Discontinued        400 mg 200 mL/hr over 60 Minutes Intravenous Every 12 hours 01/28/21 1517 01/29/21 1431   01/23/21 1315  vancomycin (VANCOCIN) 50 mg/mL oral solution 125 mg        125 mg Oral Every 6 hours 01/23/21 1226 01/29/21 2359   01/23/21 1000  fidaxomicin (DIFICID) tablet 200 mg   Status:  Discontinued        200 mg Oral 2 times daily 01/23/21 0859 01/23/21 1226   01/17/21 0600  vancomycin (VANCOREADY) IVPB 1500 mg/300 mL  Status:  Discontinued        1,500 mg 150 mL/hr over 120 Minutes Intravenous Every 24 hours 01/16/21 1732 01/17/21 1216   01/16/21 2200  metroNIDAZOLE (FLAGYL) IVPB 500 mg  Status:  Discontinued        500 mg 100 mL/hr over 60 Minutes Intravenous Every 8 hours 01/16/21 1622 01/17/21 1216   01/16/21 2000  vancomycin (VANCOCIN) 50 mg/mL oral solution 125 mg  Status:  Discontinued        125 mg Oral 4 times daily 01/16/21 1805 01/23/21 0859   01/16/21 1800  ceFEPIme (MAXIPIME) 2 g in sodium chloride 0.9 % 100 mL IVPB  Status:  Discontinued        2 g 200 mL/hr over 30 Minutes Intravenous Every 8 hours 01/16/21 1728 01/17/21 1216   01/16/21 1545  vancomycin (VANCOCIN) 50 mg/mL oral solution 125 mg  Status:  Discontinued        125 mg Oral 4 times daily 01/16/21 1539 01/16/21 1622   01/16/21 1445  metroNIDAZOLE (FLAGYL) IVPB 500 mg        500 mg 100 mL/hr over 60 Minutes Intravenous  Once 01/16/21 1438 01/16/21 1622   01/16/21 1330  vancomycin (VANCOCIN) IVPB 1000 mg/200 mL premix        1,000 mg 200 mL/hr over 60 Minutes Intravenous  Once 01/16/21 1320 01/16/21 1538        Subjective   Seen and examined at bedside.  Complaining of significant nausea with minimal improvement after Zofran earlier this morning.  Having a tough time keeping down her breakfast as well.  She was hoping to be discharged today but understands she should stay tonight.  No fever, chills, night sweats or any other complaints  Objective   Vitals:   02/08/21 1000 02/08/21 1353 02/08/21 2223 02/09/21 0545  BP: (!) 154/97 119/88 (!) 147/95 (!) 153/82  Pulse:  78 72 67  Resp:  18 20 16   Temp:  98 F (36.7 C) 98.5 F (36.9 C) 98.3 F (36.8 C)  TempSrc:  Oral Oral Oral  SpO2:  90% 94% (!) 89%  Weight:      Height:        Intake/Output Summary (Last 24 hours) at  02/09/2021  1427 Last data filed at 02/09/2021 1300 Gross per 24 hour  Intake --  Output 275 ml  Net -275 ml   Filed Weights   01/16/21 1700 02/04/21 0618 02/05/21 0700  Weight: 102.1 kg 103.9 kg 104.3 kg    Examination:  Physical Exam Vitals and nursing note reviewed.  Constitutional:      Comments: Appears uncomfortable  HENT:     Head: Normocephalic and atraumatic.  Eyes:     Conjunctiva/sclera: Conjunctivae normal.  Cardiovascular:     Rate and Rhythm: Normal rate and regular rhythm.  Pulmonary:     Effort: Pulmonary effort is normal.     Breath sounds: Normal breath sounds.  Abdominal:     General: Abdomen is flat.     Palpations: Abdomen is soft.     Comments: Abdominal drain with minimal slightly cloudy, yellow nonbloody drainage  Musculoskeletal:        General: No swelling or tenderness.  Skin:    Coloration: Skin is not jaundiced or pale.  Neurological:     Mental Status: She is alert. Mental status is at baseline.  Psychiatric:        Mood and Affect: Mood normal.        Behavior: Behavior normal.     Data Reviewed: I have personally reviewed following labs and imaging studies  CBC: Recent Labs  Lab 02/03/21 0215 02/04/21 0545 02/09/21 1102  WBC 8.8 8.5 8.2  NEUTROABS 3.2  --   --   HGB 10.9* 10.5* 12.1  HCT 34.7* 33.1* 38.0  MCV 99.1 97.6 99.5  PLT 385 351 299   Basic Metabolic Panel: Recent Labs  Lab 02/03/21 0215 02/04/21 0545 02/05/21 0538 02/06/21 0536 02/07/21 1035 02/09/21 1102  NA 139 139 140 141 140 141  K 3.4* 2.9* 3.7 3.2* 3.6 3.1*  CL 107 107 106 106 105 105  CO2 24 25 28 25 27 27   GLUCOSE 116* 112* 109* 121* 116* 127*  BUN 5* <5* <5* 6 8 7   CREATININE 0.61 0.47 0.56 0.55 0.54 0.64  CALCIUM 8.4* 8.5* 8.6* 8.8* 8.6* 9.0  MG 1.8  --   --   --  1.8 1.9  PHOS 3.3  --   --   --  3.9  --    GFR: Estimated Creatinine Clearance: 93.5 mL/min (by C-G formula based on SCr of 0.64 mg/dL). Liver Function Tests: Recent Labs  Lab  02/03/21 0215 02/07/21 1035 02/09/21 1102  AST 31  --  37  ALT 14  --  16  ALKPHOS 269*  --  220*  BILITOT 0.6  --  0.6  PROT 6.6  --  7.0  ALBUMIN 2.4* 2.4* 2.7*   No results for input(s): LIPASE, AMYLASE in the last 168 hours. No results for input(s): AMMONIA in the last 168 hours. Coagulation Profile: No results for input(s): INR, PROTIME in the last 168 hours. Cardiac Enzymes: No results for input(s): CKTOTAL, CKMB, CKMBINDEX, TROPONINI in the last 168 hours. BNP (last 3 results) No results for input(s): PROBNP in the last 8760 hours. HbA1C: No results for input(s): HGBA1C in the last 72 hours. CBG: No results for input(s): GLUCAP in the last 168 hours. Lipid Profile: No results for input(s): CHOL, HDL, LDLCALC, TRIG, CHOLHDL, LDLDIRECT in the last 72 hours. Thyroid Function Tests: No results for input(s): TSH, T4TOTAL, FREET4, T3FREE, THYROIDAB in the last 72 hours. Anemia Panel: No results for input(s): VITAMINB12, FOLATE, FERRITIN, TIBC, IRON, RETICCTPCT in the last 72 hours.  Sepsis Labs: No results for input(s): PROCALCITON, LATICACIDVEN in the last 168 hours.  No results found for this or any previous visit (from the past 240 hour(s)).       Radiology Studies: No results found.      Scheduled Meds: . busPIRone  5 mg Oral Q breakfast   And  . busPIRone  5 mg Oral Q1200   And  . busPIRone  10 mg Oral QHS  . cefdinir  300 mg Oral Q12H  . famotidine  40 mg Oral Daily  . fentaNYL  1 patch Transdermal Q72H  . multivitamin with minerals  1 tablet Oral Daily  . polycarbophil  625 mg Oral Daily  . propranolol  20 mg Oral BID  . Rivaroxaban  15 mg Oral BID WC   Followed by  . [START ON 02/25/2021] rivaroxaban  20 mg Oral Q supper  . saccharomyces boulardii  250 mg Oral BID  . scopolamine  1 patch Transdermal Q72H  . sodium chloride flush  3 mL Intravenous Q12H  . sodium chloride flush  5 mL Intracatheter Q8H  . vancomycin  125 mg Oral Q12H   Continuous  Infusions: . promethazine (PHENERGAN) injection (IM or IVPB) 12.5 mg (02/09/21 1058)     Time spent: 35 minutes with over 50% of the time coordinating the patient's care    Harold Hedge, DO [facility]   Call night coverage person covering after 7pm

## 2021-02-09 NOTE — TOC Progression Note (Signed)
Transition of Care [facility]) - Progression Note    Patient Details  Name: Fina Heizer MRN: [id number] Date of Birth: 22-Jun-1961  [facility] CM/SW Contact  Joaquin Courts, RN Phone Number: 02/09/2021, 8:28 AM  Clinical Narrative:    Patient set up with [facility] for [facility] RN services.   Expected Discharge Plan: [place] Barriers to Discharge: No Barriers Identified  Expected Discharge Plan and Services Expected Discharge Plan: [place]   Discharge Planning Services: CM Consult Post Acute Care Choice: [place] arrangements for the past 2 months: Single Family Home                           HH Arranged: RN [facility] Agency: [place] Date [place]: 02/09/21 Time [place]: [id number] Representative spoke with at [place]: [place] ([place]) Interventions    Readmission Risk Interventions No flowsheet data found.

## 2021-02-09 NOTE — Progress Notes (Signed)
Referring Physician(s): Wendy Barber,P  Supervising Physician: Wendy Barber  Patient Status:  [facility] - In-pt  Chief Complaint:  Abdominal pain/abscess/nausea/loose stools  Subjective: Patient with some occasional nausea, occasional loose stools, some mid lower abdominal pain but not worsening, mild hypokalemia   Allergies: Penicillins, Alprazolam, Ativan [lorazepam], Corticosteroids, Erythromycin, Erythromycin base, Milnacipran hcl, Prednisone, Savella  [milnacipran], Povidone iodine, and Prednisolone  Medications: Prior to Admission medications   Medication Sig Start Date End Date Taking? Authorizing Provider  busPIRone (BUSPAR) 5 MG tablet Take 5 mg by mouth See admin instructions. Takes 1 tablet in the morning, 1 tablet in the afternoon and 2 tablets at night   Yes [provider]  diphenoxylate-atropine (LOMOTIL) 2.5-0.025 MG tablet Take 1 tablet by mouth daily as needed for diarrhea or loose stools. 01/15/21  Yes [provider]  enoxaparin (LOVENOX) 40 MG/0.4ML injection Inject 40 mg into the skin daily. 01/01/21  Yes [provider]  HYDROmorphone (DILAUDID) 2 MG tablet Take 2 mg by mouth every 4 (four) hours as needed for moderate pain. 01/08/21  Yes [provider]  loperamide (IMODIUM) 2 MG capsule Take 4 mg by mouth in the morning, at noon, in the evening, and at bedtime. 01/01/21  Yes [provider]  ondansetron (ZOFRAN) 8 MG tablet Take 8 mg by mouth every 8 (eight) hours as needed for nausea or vomiting. 02/26/20  Yes [provider]  pantoprazole (PROTONIX) 40 MG tablet Take 40 mg by mouth every morning. 01/01/21  Yes [provider]  diazepam (VALIUM) 5 MG tablet Take 40 minutes prior to MRI Patient not taking: No sig reported 07/16/20   Wendy Napoleon, MD  insulin glargine (LANTUS) 100 UNIT/ML injection Inject 0.1 mLs (10 Units total) into the skin at bedtime. Before bed Patient not taking: No sig reported 11/26/19    Wendy Sells, MD  insulin lispro (HUMALOG) 100 UNIT/ML injection Inject 0.08 mLs (8 Units total) into the skin 3 (three) times daily before meals. Patient not taking: No sig reported 11/26/19   Wendy Sells, MD     Vital Signs: BP 134/72 (BP Location: Left Arm)   Pulse 96   Temp 98.1 F (36.7 C) (Oral)   Resp 18   Ht 5\' 6"  (1.676 m)   Wt 229 lb 15 oz (104.3 kg)   SpO2 97%   BMI 37.11 kg/m   Physical Exam awake, alert.  Lower mid abdominal drain intact, insertion site okay, mildly tender to palpation, output 25 cc Imaging: No results found.  Labs:  CBC: Recent Labs    02/02/21 0515 02/03/21 0215 02/04/21 0545 02/09/21 1102  WBC 7.9 8.8 8.5 8.2  HGB 10.2* 10.9* 10.5* 12.1  HCT 33.4* 34.7* 33.1* 38.0  PLT 251 385 351 359    COAGS: Recent Labs    02/02/21 0515  INR 1.2    BMP: Recent Labs    03/19/20 1109 05/01/20 1156 07/27/20 0922 10/28/20 0956 02/05/21 0538 02/06/21 0536 02/07/21 1035 02/09/21 1102  NA 143 141 143   < > 140 141 140 141  K 3.6 3.7 4.1   < > 3.7 3.2* 3.6 3.1*  CL 105 104 103   < > 106 106 105 105  CO2 30 27 33*   < > 28 25 27 27   GLUCOSE 133* 102* 160*   < > 109* 121* 116* 127*  BUN 16 21* 19   < > <5* 6 8 7   CALCIUM 10.2 10.5* 10.4*   < >  8.6* 8.8* 8.6* 9.0  CREATININE 0.78 1.05* 0.83   < > 0.56 0.55 0.54 0.64  GFRNONAA >60 59* >60   < > >60 >60 >60 >60  GFRAA >60 >60 >60  --   --   --   --   --    < > = values in this interval not displayed.    LIVER FUNCTION TESTS: Recent Labs    02/01/21 0600 02/02/21 0515 02/03/21 0215 02/07/21 1035 02/09/21 1102  BILITOT 0.6 0.7 0.6  --  0.6  AST 26 30 31   --  37  ALT 13 13 14   --  16  ALKPHOS 277* 253* 269*  --  220*  PROT 6.4* 6.4* 6.6  --  7.0  ALBUMIN 2.3* 2.3* 2.4* 2.4* 2.7*    Assessment and Plan: Patient with history of metastatic appendiceal cancer, pseudomyxoma peritonei and peritoneal carcinomatosis, status post CRS/HI PEC and splenectomy in 2005,  recurrent cancer status post HI PEC and debulking on 12/10/2020 in [place];complicated by enterotomy requiring return to the OR on postop day 4 and then again for closure of open midline wound;now with postop abdominal wall fluid collection and deep pelvic collection on CT;hx c diff;status post drainage of midline abdominal fluid collection on 4/6;s/prepositioning and upsizing of abdominal drain to 16 [place] along with drain injection confirming enterocutaneous fistula on 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 Rowe Robert, PA-C 02/09/2021, 4:58 PM   I spent a total of 15 minutes at the the patient's bedside AND on the patient's hospital floor or unit, greater than 50% of which was counseling/coordinating care for lower abdominal abscess drain    Patient ID: [facility], female   DOB: 1960-12-27, 60 y.o.   MRN: [id number]

## 2021-02-10 LAB — BASIC METABOLIC PANEL
Anion gap: 8 (ref 5–15)
BUN: 8 mg/dL (ref 6–20)
CO2: 27 mmol/L (ref 22–32)
Calcium: 8.6 mg/dL — ABNORMAL LOW (ref 8.9–10.3)
Chloride: 106 mmol/L (ref 98–111)
Creatinine, Ser: 0.65 mg/dL (ref 0.44–1.00)
GFR, Estimated: >60 mL/min (ref >60)
Glucose, Bld: 130 mg/dL — ABNORMAL HIGH (ref 70–99)
Potassium: 3.5 mmol/L (ref 3.5–5.1)
Sodium: 141 mmol/L (ref 135–145)

## 2021-02-10 MED ORDER — PROPRANOLOL HCL 20 MG PO TABS: 20.0000 mg | ORAL_TABLET | Freq: Two times a day (BID) | ORAL | 0 refills | Status: DC

## 2021-02-10 MED ORDER — RIVAROXABAN (XARELTO) VTE STARTER PACK (15 & 20 MG): ORAL_TABLET | ORAL | 0 refills | Status: DC

## 2021-02-10 MED ORDER — CALCIUM POLYCARBOPHIL 625 MG PO TABS: 625.0000 mg | ORAL_TABLET | Freq: Every day | ORAL | 0 refills | Status: AC

## 2021-02-10 MED ORDER — ONDANSETRON HCL 4 MG PO TABS: 8.0000 mg | ORAL_TABLET | Freq: Four times a day (QID) | ORAL | 0 refills | Status: AC | PRN

## 2021-02-10 MED ORDER — SIMETHICONE 80 MG PO CHEW: 80.0000 mg | CHEWABLE_TABLET | Freq: Four times a day (QID) | ORAL | 0 refills | Status: DC | PRN

## 2021-02-10 MED ORDER — SACCHAROMYCES BOULARDII 250 MG PO CAPS: 250.0000 mg | ORAL_CAPSULE | Freq: Two times a day (BID) | ORAL | 0 refills | Status: AC

## 2021-02-10 MED ORDER — HYDROMORPHONE HCL 2 MG PO TABS: 2.0000 mg | ORAL_TABLET | ORAL | 0 refills | Status: AC | PRN

## 2021-02-10 MED ORDER — CEFDINIR 300 MG PO CAPS: 300.0000 mg | ORAL_CAPSULE | Freq: Two times a day (BID) | ORAL | 0 refills | Status: AC

## 2021-02-10 MED ORDER — GERHARDT'S BUTT CREAM: 1.0000 "application " | TOPICAL_CREAM | Freq: Four times a day (QID) | CUTANEOUS | 0 refills | Status: DC | PRN

## 2021-02-10 MED ORDER — VANCOMYCIN 50 MG/ML ORAL SOLUTION: 125.0000 mg | Freq: Two times a day (BID) | ORAL | 0 refills | Status: AC

## 2021-02-10 NOTE — Discharge Summary (Signed)
Physician Discharge Summary  Wendy Barber ZOX:[id number] DOB: [birth year]   PCP: Glendon Axe, MD  Admit date: 01/16/2021 Discharge date: 02/10/2021 Length of Stay: 25 days   Code Status: Full Code  Admitted From:  Home Discharged to:   [place]:  None  Equipment/Devices: Abdominal drain Discharge Condition:  Stable  Recommendations for Outpatient Follow-up   1. Follow up with PCP in 1 week 2. Follow up CMP/CBC  3. Follow up next week with ID in the clinic. Cefdinir to be completed on day of appointment with PO vancomycin to complete one week later. ID to adjust antibiotic schedule at her appointment if needed. 4. Lovenox changed to Xarelto which she will continue indefinitely 5. Started on propranolol 6. Follow up with IR with CT and possible drain injection at the clinic once the output is less than 10 ml for 24 hours. 7. 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 Discharged with several days of dilaudid for pain. Further management per PCP or oncology after discharge  Hospital Summary  This is a 60 year old female with a past medical history of metastatic appendiceal cancer, pseudomyxoma peritonei and peritoneal carcinomatosiss/p CRS/HIPECand splenectomy in 2005 and recently CRS/HIPEC on 12/10/2020 for recurrent cancer (see H&P on 3/26 by Dr. Neysa Bonito for further surgical details),autoimmune hepatitis,DVT and PE with IVC filtercurrently on Lovenox, hypertension, diabetes who presented to the ED with multiple complaints including intractable diarrhea since her procedure, fatigue and wound drainage for 1 day.  ED Course:Afebrile, hypertensive, on room air. Notable Labs:Sodium 140, K2.9, glucose 122, BUN 9, creatinine 0.39, alk phos 185, lipase 34, AST 20, ALT 14, lactic acid 1.8, WBC 9.4, Hb 10.0, C. difficile positive, COVID-19 pending. Notable Imaging:CT abdomen pelvis with contrast-gas and fluid collection within the  anterior abdominal wall ventral surgical wound measuring 3.2 x 1.8 x 13 cm concerning for wound infection, additional loculated fluid collection in the pelvis superior and anterior to the urinary bladder measuring 5 x 5 x 2 0.6 x 3.8 cm possible sterile versus infected postoperative collection, scattered infiltrative changes of mesenteric and abdominal fat either related to surgery though cannot exclude involvement of peritoneal tumor, diffuse colonic and small bowel wall thickening which could be related to surgery though enteritis is not excluded, possible cirrhosis. Patient receivedfentanyl, Dilaudid, Zofran, KCl p.o, vancomycin IV, metronidazole, 2 L NS bolus.General surgery was consulted by the ED provider  She had since been started on oral vancomycin for C. difficile.  She is s/p IR drain placed on 4/6 with Dr. [name] for an abdominal wall abscess which grew Klebsiella pneumonia and Acinetobacter species and s/p repositioning of drain with Dr. Valla Leaver on 4/12.  She has been on cefdinir and IV Flagyl (poorly tolerated p.o Flagyl).  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  Additionally, she was found to have an acute DVT of her right common femoral, popliteal, posterior tibial and peroneal veins and superior mesenteric vein, portal vein and iliac vein on 4/12 and has a history of IVC filter.  Her Lovenox was switched to a heparin drip and now on Xarelto.  Plan was initially to discharge on 4/19 however she has had worsening nausea, vomiting and some diarrhea and so her Flagyl was discontinued at the recommendation of ID and lack of anaerobes isolated from  drain cultures. Her symptoms significantly improved over the next 24 hours and she was able to tolerate PO intake and discharge in stable condition.   Per ID, since she  had and improved CT scan on 4/14 her antibiotic regimen was shortened and plan was to continue Cefdinir through her scheduled ID appointment on 02/18/21 (3 weeks post drain placement) and continue PO vancomycin 1 week after stopping Cefdinir. Further antibiotic plan to be made at her appointment  A & P   Principal Problem:   Surgical wound infection Active Problems:   Autoimmune hepatitis ([place])   DM2 (diabetes mellitus, type 2) ([place])   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)   Malignant pseudomyxoma peritonei ([place])   DVT of deep femoral vein, left ([place])   Pulmonary embolism, bilateral (HCC)   Presence of IVC filter   C. difficile colitis   Abdominal wall abscess   Hypokalemia     1. Postsurgical abdominal wound infection  history of anastomotic leak/surgical site infection abdominal wound  intra-abdominal and abdominal wall abscess with probable fistula  s/p abdominal drain a. Previous superficial swab of wound dehiscence growing Acinetobacter/Candida on 3/26 but not present in the abdominal wall abscess from 4/6 drain culture b. Klebsiella pneumonia noted on culture 4/6 from drain c. Continue cefdinir 300 mg p.o. twice daily -> continue until 4/28 unless otherwise noted d. IR to set up a follow up appointment for CT and possible drain injection at the clinic once the output is less than 10 ml for 24 hours  2. C. difficile colitis, slowly improving a. Received a dose of Dificid on 4/2 b. On probiotics c. Continue p.o. vancomycin until 5/5 unless otherwise noted  3. Intra-abdominal pain, multifactorial: Colitis, abscesses, fistula, abdominal cancer a. Continue Dilaudid 2 mg Q4h as needed for severe pain b. Held fentanyl patch at discharge which was ordered yesterday by oncology c. She was instructed to contact her PCP or oncologist regarding further pain management if needed d. Follow-up with her general surgeon in [place] at discharge  4. Acute  DVT of the right common femoral, popliteal, posterior tibial, peroneal, superior mesenteric, portal and iliac veins with history of DVT and PE with IVC filter a. Lovenox -> Xarelto b. Continue Xarelto indefinitely  5. Orthostatic hypotension  hypertension, resolved a. Continue propranolol and holding home amlodipine  6. Essential tremor a. Continue home propranolol  7. Anxiety a. Continue home BuSpar  8. Type 2 diabetes a. Diet controlled  9. Hypokalemia, resolved  10. Chronic normocytic anemia, stable   11. History of metastatic appendiceal cancer, pseudomyxoma peritonei and peritoneal carcinomatosis s/p CRS/HI PEC and splenectomy in 2005 and recently CRS/HI PEC on 12/10/2020 for recurrent cancer a. Has followed with oncology during her hospitalization b. Should follow-up with her oncologist in [facility] after discharge     Consultants  . General surgery . IR . ID . Oncology  Procedures  . IR drain on 01/27/21 . IR drain repositioning on 4/12  Antibiotics   Anti-infectives (From admission, onward)   Start     Dose/Rate Route Frequency Ordered Stop   02/10/21 0000  cefdinir (OMNICEF) 300 MG capsule        300 mg Oral Every 12 hours 02/10/21 0927 02/18/21 2359   02/10/21 0000  vancomycin (VANCOCIN) 50 mg/mL oral solution        125 mg Oral Every 12 hours 02/10/21 0927 02/25/21 2359   02/09/21 1200  metroNIDAZOLE (FLAGYL) IVPB 500 mg  Status:  Discontinued  500 mg 100 mL/hr over 60 Minutes Intravenous Every 8 hours 02/09/21 1101 02/09/21 1200   02/08/21 2200  metroNIDAZOLE (FLAGYL) tablet 500 mg  Status:  Discontinued        500 mg Oral Every 12 hours 02/08/21 1617 02/09/21 1101   02/01/21 2200  metroNIDAZOLE (FLAGYL) IVPB 500 mg  Status:  Discontinued        500 mg 100 mL/hr over 60 Minutes Intravenous Every 12 hours 02/01/21 1804 02/08/21 1617   01/30/21 1000  vancomycin (VANCOCIN) 50 mg/mL oral solution 125 mg        125 mg Oral Every 12 hours  01/29/21 1431     01/29/21 2200  metroNIDAZOLE (FLAGYL) tablet 500 mg  Status:  Discontinued        500 mg Oral Every 12 hours 01/29/21 1700 02/01/21 1804   01/29/21 1800  metroNIDAZOLE (FLAGYL) tablet 500 mg  Status:  Discontinued        500 mg Oral Every 8 hours 01/29/21 1431 01/29/21 1700   01/29/21 1530  cefdinir (OMNICEF) capsule 300 mg        300 mg Oral Every 12 hours 01/29/21 1431     01/28/21 1800  metroNIDAZOLE (FLAGYL) IVPB 500 mg  Status:  Discontinued        500 mg 100 mL/hr over 60 Minutes Intravenous Every 8 hours 01/28/21 1517 01/29/21 1431   01/28/21 1600  ciprofloxacin (CIPRO) IVPB 400 mg  Status:  Discontinued        400 mg 200 mL/hr over 60 Minutes Intravenous Every 12 hours 01/28/21 1517 01/29/21 1431   01/23/21 1315  vancomycin (VANCOCIN) 50 mg/mL oral solution 125 mg        125 mg Oral Every 6 hours 01/23/21 1226 01/29/21 2359   01/23/21 1000  fidaxomicin (DIFICID) tablet 200 mg  Status:  Discontinued        200 mg Oral 2 times daily 01/23/21 0859 01/23/21 1226   01/17/21 0600  vancomycin (VANCOREADY) IVPB 1500 mg/300 mL  Status:  Discontinued        1,500 mg 150 mL/hr over 120 Minutes Intravenous Every 24 hours 01/16/21 1732 01/17/21 1216   01/16/21 2200  metroNIDAZOLE (FLAGYL) IVPB 500 mg  Status:  Discontinued        500 mg 100 mL/hr over 60 Minutes Intravenous Every 8 hours 01/16/21 1622 01/17/21 1216   01/16/21 2000  vancomycin (VANCOCIN) 50 mg/mL oral solution 125 mg  Status:  Discontinued        125 mg Oral 4 times daily 01/16/21 1805 01/23/21 0859   01/16/21 1800  ceFEPIme (MAXIPIME) 2 g in sodium chloride 0.9 % 100 mL IVPB  Status:  Discontinued        2 g 200 mL/hr over 30 Minutes Intravenous Every 8 hours 01/16/21 1728 01/17/21 1216   01/16/21 1545  vancomycin (VANCOCIN) 50 mg/mL oral solution 125 mg  Status:  Discontinued        125 mg Oral 4 times daily 01/16/21 1539 01/16/21 1622   01/16/21 1445  metroNIDAZOLE (FLAGYL) IVPB 500 mg        500 mg 100  mL/hr over 60 Minutes Intravenous  Once 01/16/21 1438 01/16/21 1622   01/16/21 1330  vancomycin (VANCOCIN) IVPB 1000 mg/200 mL premix        1,000 mg 200 mL/hr over 60 Minutes Intravenous  Once 01/16/21 1320 01/16/21 1538       Subjective  Patient seen and examined at bedside no acute  distress and resting comfortably.  No events overnight.  Tolerating diet. In good spirits and anticipating discharge.   Denies any chest pain, shortness of breath, fever, nausea, vomiting, urinary or bowel complaints. Otherwise ROS negative    Objective   Discharge Exam: Vitals:   02/09/21 2103 02/10/21 0433  BP: 119/80 131/80  Pulse: 70 71  Resp: 18 16  Temp: 98.4 F (36.9 C) 98.3 F (36.8 C)  SpO2: 90% (!) 89%   Vitals:   02/09/21 0545 02/09/21 1429 02/09/21 2103 02/10/21 0433  BP: (!) 153/82 134/72 119/80 131/80  Pulse: 67 96 70 71  Resp: _0 Temp: 98.3 F (36.8 C) 98.1 F (36.7 C) 98.4 F (36.9 C) 98.3 F (36.8 C)  TempSrc: Oral Oral Oral Oral  SpO2: (!) 89% 97% 90% (!) 89%  Weight:      Height:        Physical Exam Vitals and nursing note reviewed.  Constitutional:      Appearance: Normal appearance.  HENT:     Head: Normocephalic and atraumatic.  Eyes:     Conjunctiva/sclera: Conjunctivae normal.  Cardiovascular:     Rate and Rhythm: Normal rate and regular rhythm.  Pulmonary:     Effort: Pulmonary effort is normal.     Breath sounds: Normal breath sounds.  Abdominal:     General: Abdomen is flat.     Palpations: Abdomen is soft.     Comments: Minimal cloudy, non bloody drainage in drain  Musculoskeletal:        General: No swelling or tenderness.  Skin:    Coloration: Skin is not jaundiced or pale.  Neurological:     Mental Status: She is alert. Mental status is at baseline.  Psychiatric:        Mood and Affect: Mood normal.        Behavior: Behavior normal.       The results of significant diagnostics from this hospitalization (including imaging,  microbiology, ancillary and laboratory) are listed below for reference.     Microbiology: No results found for this or any previous visit (from the past 240 hour(s)).   Labs: BNP (last 3 results) No results for input(s): BNP in the last 8760 hours. Basic Metabolic Panel: Recent Labs  Lab 02/05/21 0538 02/06/21 0536 02/07/21 1035 02/09/21 1102 02/10/21 0541  NA 140 141 140 141 141  K 3.7 3.2* 3.6 3.1* 3.5  CL 106 106 105 105 106  CO2 _1 GLUCOSE 109* 121* 116* 127* 130*  BUN <5* _2 CREATININE 0.56 0.55 0.54 0.64 0.65  CALCIUM 8.6* 8.8* 8.6* 9.0 8.6*  MG  --   --  1.8 1.9  --   PHOS  --   --  3.9  --   --    Liver Function Tests: Recent Labs  Lab 02/07/21 1035 02/09/21 1102  AST  --  37  ALT  --  16  ALKPHOS  --  220*  BILITOT  --  0.6  PROT  --  7.0  ALBUMIN 2.4* 2.7*   No results for input(s): LIPASE, AMYLASE in the last 168 hours. No results for input(s): AMMONIA in the last 168 hours. CBC: Recent Labs  Lab 02/04/21 0545 02/09/21 1102  WBC 8.5 8.2  HGB 10.5* 12.1  HCT 33.1* 38.0  MCV 97.6 99.5  PLT 351 359   Cardiac Enzymes: No results for input(s): CKTOTAL, CKMB, CKMBINDEX, TROPONINI in the last  168 hours. BNP: Invalid input(s): POCBNP CBG: No results for input(s): GLUCAP in the last 168 hours. D-Dimer No results for input(s): DDIMER in the last 72 hours. Hgb A1c No results for input(s): HGBA1C in the last 72 hours. Lipid Profile No results for input(s): CHOL, HDL, LDLCALC, TRIG, CHOLHDL, LDLDIRECT in the last 72 hours. Thyroid function studies No results for input(s): TSH, T4TOTAL, T3FREE, THYROIDAB in the last 72 hours.  Invalid input(s): FREET3 Anemia work up No results for input(s): VITAMINB12, FOLATE, FERRITIN, TIBC, IRON, RETICCTPCT in the last 72 hours. Urinalysis    Component Value Date/Time   COLORURINE AMBER (A) 11/21/2019 0832   APPEARANCEUR CLEAR 11/21/2019 0832   LABSPEC 1.023 11/21/2019 0832   PHURINE 6.0  11/21/2019 0832   GLUCOSEU NEGATIVE 11/21/2019 0832   HGBUR NEGATIVE 11/21/2019 0832   BILIRUBINUR NEGATIVE 11/21/2019 0832   KETONESUR NEGATIVE 11/21/2019 0832   PROTEINUR NEGATIVE 11/21/2019 0832   UROBILINOGEN 0.2 10/24/2014 1645   NITRITE POSITIVE (A) 11/21/2019 0832   LEUKOCYTESUR NEGATIVE 11/21/2019 0832   Sepsis Labs Invalid input(s): PROCALCITONIN,  WBC,  LACTICIDVEN Microbiology No results found for this or any previous visit (from the past 240 hour(s)).  Discharge Instructions     Discharge Instructions    Diet - low sodium heart healthy   Complete by: As directed    Discharge instructions   Complete by: As directed    You were seen in the hospital for a post surgical wound infection as well as C. difficile colitis. While you were here, you had a drain placed at the infection site to help it drain.   Upon discharge: - Continue Cefdinir twice daily until your next appointment with infectious disease on 4/28. Your doctor will determine if you need to continue antibiotics at that time - Continue Vancomycin by mouth for one week past completing Cefdinir. Your last day as of right now is 02/25/21 - Get lab work prior to your ID appointment - Remove the fentanyl patch tomorrow - You can take Zofran 4 mg every 6 hours as needed for nausea - You will need to follow up with interventional radiology for the drain management and continue wound care as outlined in the discharge instructions - You will need to follow up with your surgeons in [place] in the next month - Stop taking Lovenox and switch to Xarelto. This is taken twice daily until the completion of a 21 day period then continued once daily indefinitely. This is a blood thinner. Monitor your stool for any signs of bleeding and be mindful not to fall  - Take Propranolol - Review the After Visit Summary for further details regarding medication adjustments  If you have any significant change or worsening of your symptoms do  not hesitate to contact your primary care doctor or return to the ED   Discharge wound care:   Complete by: As directed    once daily irrigation with 5 cc sterile normal saline, output recording and gauze dressing changes every 1 to 2 days   Increase activity slowly   Complete by: As directed      Allergies as of 02/10/2021      Reactions   Penicillins Shortness Of Breath   Other reaction(s): Irregular Heart Rate, Other (See Comments) Rapid heartrate   Alprazolam Hives, Other (See Comments)   Note: tolerates midazolam fine unresponsive Hard to arouse unresponsive Hard to arouse Other reaction(s): Other (See Comments) unresponsive   Ativan [lorazepam] Other (See Comments)   Note: tolerates  midazolam fine Face & Throat Swelling.   Corticosteroids Other (See Comments)   Other reaction(s): Other (see comments) Psychotic behaviour   Erythromycin    Other reaction(s): Other (See Comments) Severe stomach pain   Erythromycin Base Hives   Milnacipran Hcl Other (See Comments)   Other reaction(s):  psychotic episode   Prednisone Other (See Comments)   Anxiety & Nervous Breakdown.   Savella  [milnacipran]    Povidone Iodine Rash   Had rash under bandage after surgery , has had betadine since without reaction    Prednisolone Anxiety      Medication List    STOP taking these medications   diazepam 5 MG tablet Commonly known as: Valium   diphenoxylate-atropine 2.5-0.025 MG tablet Commonly known as: LOMOTIL   enoxaparin 40 MG/0.4ML injection Commonly known as: LOVENOX   insulin glargine 100 UNIT/ML injection Commonly known as: LANTUS   insulin lispro 100 UNIT/ML injection Commonly known as: HUMALOG   loperamide 2 MG capsule Commonly known as: IMODIUM     TAKE these medications   busPIRone 5 MG tablet Commonly known as: BUSPAR Take 5 mg by mouth See admin instructions. Takes 1 tablet in the morning, 1 tablet in the afternoon and 2 tablets at night   cefdinir 300 MG  capsule Commonly known as: OMNICEF Take 1 capsule (300 mg total) by mouth every 12 (twelve) hours for 8 days.   Gerhardt's butt cream Crea Apply 1 application topically 4 (four) times daily as needed for irritation.   HYDROmorphone 2 MG tablet Commonly known as: DILAUDID Take 1 tablet (2 mg total) by mouth every 4 (four) hours as needed for up to 7 days for moderate pain or severe pain. What changed: reasons to take this   ondansetron 4 MG tablet Commonly known as: ZOFRAN Take 2 tablets (8 mg total) by mouth every 6 (six) hours as needed for nausea or vomiting. What changed:   medication strength  when to take this   pantoprazole 40 MG tablet Commonly known as: PROTONIX Take 40 mg by mouth every morning.   polycarbophil 625 MG tablet Commonly known as: FIBERCON Take 1 tablet (625 mg total) by mouth daily.   propranolol 20 MG tablet Commonly known as: INDERAL Take 1 tablet (20 mg total) by mouth 2 (two) times daily.   Rivaroxaban Stater Pack (15 mg and 20 mg) Commonly known as: XARELTO STARTER PACK Follow package directions: Take one 34m tablet by mouth twice a day. On day 22, switch to one 246mtablet once a day. Take with food.   saccharomyces boulardii 250 MG capsule Commonly known as: FLORASTOR Take 1 capsule (250 mg total) by mouth 2 (two) times daily.   simethicone 80 MG chewable tablet Commonly known as: MYLICON Chew 1 tablet (80 mg total) by mouth 4 (four) times daily as needed for flatulence.   vancomycin 50 mg/mL  oral solution Commonly known as: VANCOCIN Take 2.5 mLs (125 mg total) by mouth every 12 (twelve) hours for 15 days.            Discharge Care Instructions  (From admission, onward)         Start     Ordered   02/10/21 0000  Discharge wound care:       Comments: once daily irrigation with 5 cc sterile normal saline, output recording and gauze dressing changes every 1 to 2 days   02/10/21 [id number]        Follow-up Information  [facility] [place] Follow up.   Why: agency will provide home health nurse, Sharmon Revere [phone] is primary contact. Contact information: [place] [address] [phone]        Sandi Mariscal, MD Follow up.   Specialties: Interventional Radiology, Radiology Why: Out patient follow for CT scan and possible drain injection once output is less than 10 ml in 24 hours. Our office will call you to set up the appointment. Please clall our office if you have any quetions or concerns about the follow up appointment.  Contact information: [address] [place] [address] [phone]              Allergies  Allergen Reactions  . Penicillins Shortness Of Breath    Other reaction(s): Irregular Heart Rate, Other (See Comments) Rapid heartrate   . Alprazolam Hives and Other (See Comments)    Note: tolerates midazolam fine unresponsive Hard to arouse unresponsive Hard to arouse  Other reaction(s): Other (See Comments) unresponsive  . Ativan [Lorazepam] Other (See Comments)    Note: tolerates midazolam fine Face & Throat Swelling.  . Corticosteroids Other (See Comments)    Other reaction(s): Other (see comments) Psychotic behaviour    . Erythromycin     Other reaction(s): Other (See Comments) Severe stomach pain   . Erythromycin Base Hives  . Milnacipran Hcl Other (See Comments)    Other reaction(s):  psychotic episode     . Prednisone Other (See Comments)    Anxiety & Nervous Breakdown.  Ocie Cornfield  [Milnacipran]   . Povidone Iodine Rash    Had rash under bandage after surgery , has had betadine since without reaction   . Prednisolone Anxiety     Dispo: The patient is from: Home              Anticipated d/c is to: Home              Patient currently is medically stable to d/c.        Time coordinating discharge: Over 30 minutes   SIGNED:   Harold Hedge, D.O. [facility] Pager:  [phone]  02/10/2021, 12:25 PM

## 2021-02-10 NOTE — TOC Transition Note (Signed)
Transition of Care [facility]) - CM/SW Discharge Note   Patient Details  Name: Wendy Barber MRN: [id number] Date of Birth: 07-14-1961  [facility] CM/SW Contact:  Ross Ludwig, LCSW Phone Number: 02/10/2021, 11:59 AM   Clinical Narrative:     Patient will be going home with home health through [facility].  CSW signing off please reconsult with any other social work needs, home health agency has been notified of planned discharge.    Final next level of care: [place] Barriers to Discharge: Barriers Resolved   Patient Goals and CMS Choice Patient states their goals for this hospitalization and ongoing recovery are:: To return back home with home health. CMS Medicare.gov Compare Post Acute Care list provided to:: Patient Choice offered to / list presented to : Patient  Discharge Placement  Patient discharging back home with home health RN services.                     Discharge Plan and Services   Discharge Planning Services: CM Consult Post Acute Care Choice: Home Health                    HH Arranged: RN [facility] Agency: [place] Date [place]: 02/10/21 Time [place]: 1159 Representative spoke with at [place]: [place] Determinants of Health ([place]) Interventions     Readmission Risk Interventions No flowsheet data found.

## 2021-02-10 NOTE — Progress Notes (Signed)
Referring Physician(s): Dr. [name], P.   Supervising Physician: Ruthann Cancer  Patient Status:  [facility] - In-pt  Chief Complaint:  S/p intraabdominal abscess drain placement with Dr. [name] on 01/27/21 S/p repositioning of the existing drain with Dr. Laurence Ferrari on 02/02/21    Subjective:  Patient is doing fine this morning, has no abdominal pain.  Reports she had nausea and diarrhea yesterday but all resolved.  Allergies: Penicillins, Alprazolam, Ativan [lorazepam], Corticosteroids, Erythromycin, Erythromycin base, Milnacipran hcl, Prednisone, Savella  [milnacipran], Povidone iodine, and Prednisolone  Medications: Prior to Admission medications   Medication Sig Start Date End Date Taking? Authorizing Provider  busPIRone (BUSPAR) 5 MG tablet Take 5 mg by mouth See admin instructions. Takes 1 tablet in the morning, 1 tablet in the afternoon and 2 tablets at night   Yes [provider]  diphenoxylate-atropine (LOMOTIL) 2.5-0.025 MG tablet Take 1 tablet by mouth daily as needed for diarrhea or loose stools. 01/15/21  Yes [provider]  enoxaparin (LOVENOX) 40 MG/0.4ML injection Inject 40 mg into the skin daily. 01/01/21  Yes [provider]  HYDROmorphone (DILAUDID) 2 MG tablet Take 2 mg by mouth every 4 (four) hours as needed for moderate pain. 01/08/21  Yes [provider]  loperamide (IMODIUM) 2 MG capsule Take 4 mg by mouth in the morning, at noon, in the evening, and at bedtime. 01/01/21  Yes [provider]  ondansetron (ZOFRAN) 8 MG tablet Take 8 mg by mouth every 8 (eight) hours as needed for nausea or vomiting. 02/26/20  Yes [provider]  pantoprazole (PROTONIX) 40 MG tablet Take 40 mg by mouth every morning. 01/01/21  Yes [provider]  diazepam (VALIUM) 5 MG tablet Take 40 minutes prior to MRI Patient not taking: No sig reported 07/16/20   Volanda Napoleon, MD  insulin glargine (LANTUS) 100 UNIT/ML injection Inject  0.1 mLs (10 Units total) into the skin at bedtime. Before bed Patient not taking: No sig reported 11/26/19   Nita Sells, MD  insulin lispro (HUMALOG) 100 UNIT/ML injection Inject 0.08 mLs (8 Units total) into the skin 3 (three) times daily before meals. Patient not taking: No sig reported 11/26/19   Nita Sells, MD     Vital Signs: BP 131/80 (BP Location: Left Arm)   Pulse 71   Temp 98.3 F (36.8 C) (Oral)   Resp 16   Ht 5\' 6"  (1.676 m)   Wt 229 lb 15 oz (104.3 kg)   SpO2 (!) 89%   BMI 37.11 kg/m   Physical Exam Vitals and nursing note reviewed.  Constitutional:      General: She is not in acute distress.    Appearance: Normal appearance. She is not ill-appearing.  Cardiovascular:     Rate and Rhythm: Normal rate.  Pulmonary:     Effort: Pulmonary effort is normal.  Abdominal:     Palpations: Abdomen is soft.     Comments: Positive midline drain to a gravity bag. Site is unremarkable with no erythema, edema, tenderness, bleeding or drainage. Suture and stat lock in place. Dressing is clean, dry, and intact. Trace of yellow, thick, blood tinged fluid noted in the gravity bag. Drain aspirates and flushes well.   Neurological:     Mental Status: She is alert and oriented to person, place, and time.     Imaging: No results found.  Labs:  CBC: Recent Labs    02/02/21 0515 02/03/21 0215 02/04/21 0545 02/09/21 1102  WBC 7.9 8.8 8.5  8.2  HGB 10.2* 10.9* 10.5* 12.1  HCT 33.4* 34.7* 33.1* 38.0  PLT 251 385 351 359    COAGS: Recent Labs    02/02/21 0515  INR 1.2    BMP: Recent Labs    03/19/20 1109 05/01/20 1156 07/27/20 0922 10/28/20 0956 02/06/21 0536 02/07/21 1035 02/09/21 1102 02/10/21 0541  NA 143 141 143   < > 141 140 141 141  K 3.6 3.7 4.1   < > 3.2* 3.6 3.1* 3.5  CL 105 104 103   < > 106 105 105 106  CO2 30 27 33*   < > 25 27 27 27   GLUCOSE 133* 102* 160*   < > 121* 116* 127* 130*  BUN 16 21* 19   < > 6 8 7 8   CALCIUM 10.2  10.5* 10.4*   < > 8.8* 8.6* 9.0 8.6*  CREATININE 0.78 1.05* 0.83   < > 0.55 0.54 0.64 0.65  GFRNONAA >60 59* >60   < > >60 >60 >60 >60  GFRAA >60 >60 >60  --   --   --   --   --    < > = values in this interval not displayed.    LIVER FUNCTION TESTS: Recent Labs    02/01/21 0600 02/02/21 0515 02/03/21 0215 02/07/21 1035 02/09/21 1102  BILITOT 0.6 0.7 0.6  --  0.6  AST 26 30 31   --  37  ALT 13 13 14   --  16  ALKPHOS 277* 253* 269*  --  220*  PROT 6.4* 6.4* 6.6  --  7.0  ALBUMIN 2.3* 2.3* 2.4* 2.4* 2.7*    Assessment and Plan:  S/p intraabdominal abscess drain placement with Dr. [name] on 01/27/21 S/p repositioning of the existing drain with Dr. Laurence Ferrari on 02/02/21    Patient stable.  Afebrile, WBC 8.2 yesterday  Trace of yellow, thick, and slightly blood tinged fluid in the gravity bag.  Overnight OP 20 cc.  Drain and dressing clean, dry, intact.  Patient states that she might be discharged soon.   Continue with flushing TID, output recording q shift and dressing changes as needed. Would consider additional imaging when output is less than 10 ml for 24 hours not including flush material.   Further treatment plan per TRH/Oncology Appreciate and agree with the plan.  IR to follow.   If patient were to be disharged with the drain in place, our clinic will call patient to set up a follow up appointment for CT and possible drain injection at the clinic once the output is less than 10 ml for 24 hours. Out patient follow up orders in.   Outpatient discharge instruction for the drain and dressing:    Patient to flush the drain with 5 cc normal saline daily, and document output daily (subtract 5 from the total output.)   Dressing change only as needed and at least once a week.     Electronically Signed: Tera Mater, PA-C 02/10/2021, 10:15 AM   I spent a total of 15 Minutes at the the patient's bedside AND on the patient's hospital floor or unit, greater than 50% of which  was counseling/coordinating care for midline abscess drain

## 2021-02-11 ENCOUNTER — Other Ambulatory Visit: Payer: Self-pay | Admitting: Hematology & Oncology

## 2021-02-11 DIAGNOSIS — L02211 Cutaneous abscess of abdominal wall: Secondary | ICD-10-CM

## 2021-02-15 ENCOUNTER — Telehealth: Payer: Self-pay

## 2021-02-15 NOTE — Telephone Encounter (Signed)
Pts husband called to make a hospital f/u appt with [place].  Appt has been made for 03/15/20 as pt needs a mon/fri due top transport.  He also stated that we agreed to do pain management for the pt and he feels they m,ay run out of meds prior to the appt.  Pt has been taking dilaudid as needed.   [facility] came out 4/24 for eval and will seeing her two times a week.  Please adviser if we need a sooner appt.   Rosary Filosa

## 2021-02-15 NOTE — Telephone Encounter (Signed)
Per pt call/sch message dr e wants her to have lab and a visit with him next week, I asked the pt to c/b   Wendy Barber

## 2021-02-18 ENCOUNTER — Inpatient Hospital Stay: Admitting: Internal Medicine

## 2021-02-19 ENCOUNTER — Encounter: Payer: Self-pay | Admitting: Internal Medicine

## 2021-02-19 ENCOUNTER — Ambulatory Visit (INDEPENDENT_AMBULATORY_CARE_PROVIDER_SITE_OTHER): Admitting: Internal Medicine

## 2021-02-19 ENCOUNTER — Other Ambulatory Visit: Payer: Self-pay

## 2021-02-19 VITALS — BP 151/92 | HR 63 | Temp 97.5°F

## 2021-02-19 DIAGNOSIS — L02211 Cutaneous abscess of abdominal wall: Secondary | ICD-10-CM | POA: Diagnosis not present

## 2021-02-19 DIAGNOSIS — A0472 Enterocolitis due to Clostridium difficile, not specified as recurrent: Secondary | ICD-10-CM

## 2021-02-19 NOTE — Progress Notes (Signed)
   Subjective:    Patient ID: Wendy Barber, female    DOB: 1960-11-21, 60 y.o.   MRN: [id number]  HPI She is here for hsfu She has a history of metastatic appendiceal cancer, speduomyxoma peritonie and paritoneal carcinomatosis with CRS/HIPEC and splenectomy in 2005 done in [place] who came in earlier this month with wound drainage, diarrhea and fatigue with an abominal wall fluid collection noted.   Drains were placed and no initial sign of infection but C diff testing done and was Ag positive and toxin negative with a positive PCR, equivocal for active C diff.   She was also placed on cefdinir and metronidazole for concern for wound infection/abdominal fluid infection and plan through yesterday plus oral vancomycin daily for 5-7 days later.    Review of Systems  Constitutional: Negative for chills and fever.  Gastrointestinal: Positive for diarrhea. Negative for nausea.  Skin: Negative for rash.       Objective:   Physical Exam Constitutional:      Appearance: Normal appearance.  Pulmonary:     Effort: Pulmonary effort is normal.  Abdominal:     Comments: Drain noted and minimal interistitial fluid in it, no pus  Skin:    Findings: No rash.  Neurological:     Mental Status: She is alert.   SH: no tobacco       Assessment & Plan:

## 2021-02-19 NOTE — Patient Instructions (Signed)
Please call [facility] if you decide to seek counseling at [phone]

## 2021-02-19 NOTE — Assessment & Plan Note (Signed)
Equivocal testing and persistent diarrhea.  Likely multifactorial.  Will continue with vancomycin for 1 more week and stop.  Ok to restart anti-diarrheal agents now.   Return as needed

## 2021-02-19 NOTE — Assessment & Plan Note (Signed)
Nearly resolved though did get some fluconazole from Dr. Ninfa Linden.  No other antibiotics indicated at this time.

## 2021-02-24 ENCOUNTER — Ambulatory Visit
Admission: RE | Admit: 2021-02-24 | Discharge: 2021-02-24 | Disposition: A | Discharge status: Home / Self Care | Source: Ambulatory Visit | Attending: Hematology & Oncology | Admitting: Hematology & Oncology

## 2021-02-24 ENCOUNTER — Ambulatory Visit
Admission: RE | Admit: 2021-02-24 | Discharge: 2021-02-24 | Disposition: A | Discharge status: Home / Self Care | Source: Ambulatory Visit | Attending: Student | Admitting: Student

## 2021-02-24 ENCOUNTER — Other Ambulatory Visit: Payer: Self-pay | Admitting: Hematology & Oncology

## 2021-02-24 ENCOUNTER — Other Ambulatory Visit (HOSPITAL_COMMUNITY): Payer: Self-pay | Admitting: Radiology

## 2021-02-24 DIAGNOSIS — L02211 Cutaneous abscess of abdominal wall: Secondary | ICD-10-CM

## 2021-02-24 HISTORY — PX: IR RADIOLOGIST EVAL & MGMT: IMG5224

## 2021-02-24 MED ORDER — IOPAMIDOL (ISOVUE-300) INJECTION 61%
100.0000 mL | Freq: Once | INTRAVENOUS | Status: AC | PRN
  Administered 2021-02-24: 100 mL via INTRAVENOUS

## 2021-02-24 NOTE — Progress Notes (Signed)
Referring Physician(s): Dr Willene Hatchet; Dr Lodema Hong Dr Pearletha Alfred Dr Lenon [facility] [place]  Chief Complaint: The patient is seen in follow up today s/p Mid abdomen abscess drain placement in IR 01/27/21. Drain reposition and upsize 02/02/21  History of present illness:  She has a history of metastatic appendiceal cancer, pseudomyxoma peritonie and peritoneal carcinomatosis with CRS/HIPEC and splenectomy in 2005 done in [place]  with wound drainage, diarrhea and fatigue with an abominal wall fluid collection noted.   Drains were placed and no initial sign of infection. Drain placed initially 01/27/21 I IR-- upsized and repositioned 02/02/21  Scheduled today for CT evaluation of abscess and drain injection for evaluation for fistula to bowel.  Doing well at home Flushes drain daily-- although more difficult recently Still OP of 10-15 cc daily Milky yellow brown color Denies fever; chills Denies N/V-- some pain in abd daily  She is to see Dr [name] 03/15/21 Has appointment in [place] with Dr Sharon Mt September 2022    Past Medical History:  Diagnosis Date  . Arthritis   . Back pain   . Cancer [facility])    pseudomyxoma peritonei  . 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 Chronic fatigue syndrome   . Diabetes mellitus without complication ([place])   . DVT of deep femoral vein, left ([place]) 03/19/2020  . Fibromyalgia   . Goals of care, counseling/discussion 03/19/2020  . Hypertension   . Malignant pseudomyxoma peritonei ([place]) 03/19/2020  . Presence of IVC filter 03/19/2020  . Pulmonary embolism, bilateral ([place]) 03/19/2020    Past Surgical History:  Procedure Laterality Date  . ABDOMINAL HYSTERECTOMY    . ABDOMINAL SURGERY    . ACHILLES TENDON REPAIR    . APPENDECTOMY    . ARTHROPLASTY    . CARPAL TUNNEL RELEASE    . CHOLECYSTECTOMY    . IR CATHETER TUBE CHANGE  02/02/2021  . IR RADIOLOGIST EVAL & MGMT  02/24/2021  . IR  US GUIDE BX ASP/DRAIN  11/25/2019  . JOINT REPLACEMENT    . KNEE ARTHROSCOPY    . ORIF ANKLE FRACTURE Right 08/27/2019   Procedure: OPEN REDUCTION INTERNAL FIXATION RIGHT ANKLE FRACTURE;  Surgeon: Renette Butters, MD;  Location: [facility];  Service: Orthopedics;  Laterality: Right;  . perineorrophy    . TONGUE BIOPSY      Allergies: Penicillins, Alprazolam, Ativan [lorazepam], Corticosteroids, Erythromycin, Erythromycin base, Milnacipran hcl, Prednisone, Savella  [milnacipran], Povidone iodine, and Prednisolone  Medications: Prior to Admission medications   Medication Sig Start Date End Date Taking? Authorizing Provider  busPIRone (BUSPAR) 5 MG tablet Take 5 mg by mouth See admin instructions. Takes 1 tablet in the morning, 1 tablet in the afternoon and 2 tablets at night    [provider]  HYDROmorphone (DILAUDID) 2 MG tablet Take by mouth every 4 (four) hours as needed for severe pain.    [provider]  Nystatin (GERHARDT'S BUTT CREAM) CREA Apply 1 application topically 4 (four) times daily as needed for irritation. 02/10/21   Harold Hedge, MD  ondansetron (ZOFRAN) 4 MG tablet Take 2 tablets (8 mg total) by mouth every 6 (six) hours as needed for nausea or vomiting. 02/10/21 03/12/21  Harold Hedge, MD  pantoprazole (PROTONIX) 40 MG tablet Take 40 mg by mouth every morning. 01/01/21   [provider]  polycarbophil (FIBERCON) 625 MG tablet Take 1 tablet (625 mg total) by mouth daily. 02/10/21  03/12/21  Harold Hedge, MD  propranolol (INDERAL) 20 MG tablet Take 1 tablet (20 mg total) by mouth 2 (two) times daily. 02/10/21 03/12/21  Harold Hedge, MD  RIVAROXABAN [employer]) VTE STARTER PACK (15 & 20 MG) Follow package directions: Take one 15mg  tablet by mouth twice a day. On day 22, switch to one 20mg  tablet once a day. Take with food. 02/10/21   Harold Hedge, MD  saccharomyces boulardii (FLORASTOR) 250 MG capsule Take 1 capsule (250 mg total) by mouth 2 (two) times  daily. 02/10/21 03/12/21  Harold Hedge, MD  simethicone (MYLICON) 80 MG chewable tablet Chew 1 tablet (80 mg total) by mouth 4 (four) times daily as needed for flatulence. 02/10/21   Harold Hedge, MD  vancomycin (VANCOCIN) 50 mg/mL oral solution Take 2.5 mLs (125 mg total) by mouth every 12 (twelve) hours for 15 days. 02/10/21 02/25/21  Harold Hedge, MD     No family history on file.  Social History   Socioeconomic History  . Marital status: Married    Spouse name: Not on file  . Number of children: 3  . Years of education: Not on file  . Highest education level: Some college, no degree  Occupational History  . Not on file  Tobacco Use  . Smoking status: Never Smoker  . Smokeless tobacco: Never Used  Vaping Use  . Vaping Use: Not on file  Substance and Sexual Activity  . Alcohol use: No  . Drug use: Never  . Sexual activity: Not on file  Other Topics Concern  . Not on file  Social History Narrative   Right handed   One story home   Drinks occasional caffeine   Social Determinants of Health   Financial Resource Strain: Not on file  Food Insecurity: Not on file  Transportation Needs: Not on file  Physical Activity: Not on file  Stress: Not on file  Social Connections: Not on file     Vital Signs: BP 132/71   Pulse 60   Temp 98.6 F (37 C)   SpO2 98%   Physical Exam Skin:    General: Skin is warm.     Comments: Site is clean and dry Some redness of skin in area--  Some moistness in skin folds  Drain has been compromised- broken since placement 02/02/21 [place] at bag connector has broken off into drain connector site. Unable to effectively perform drain injection-- contrast coming out onto skin as quickly as injected   This compromise does NOT block Output from draining into bag--- outflow hole is much smaller but is not completely blocked     Imaging: IR Radiologist Eval & Mgmt  Result Date: 02/24/2021 Please refer to notes tab for details about  interventional procedure. (Op Note)   Labs:  CBC: Recent Labs    02/02/21 0515 02/03/21 0215 02/04/21 0545 02/09/21 1102  WBC 7.9 8.8 8.5 8.2  HGB 10.2* 10.9* 10.5* 12.1  HCT 33.4* 34.7* 33.1* 38.0  PLT 251 385 351 359    COAGS: Recent Labs    02/02/21 0515  INR 1.2    BMP: Recent Labs    03/19/20 1109 05/01/20 1156 07/27/20 0922 10/28/20 0956 02/06/21 0536 02/07/21 1035 02/09/21 1102 02/10/21 0541  NA 143 141 143   < > 141 140 141 141  K 3.6 3.7 4.1   < > 3.2* 3.6 3.1* 3.5  CL 105 104 103   < > 106 105 105 106  CO2  30 27 33*   < > 25 27 27 27   GLUCOSE 133* 102* 160*   < > 121* 116* 127* 130*  BUN 16 21* 19   < > 6 8 7 8   CALCIUM 10.2 10.5* 10.4*   < > 8.8* 8.6* 9.0 8.6*  CREATININE 0.78 1.05* 0.83   < > 0.55 0.54 0.64 0.65  GFRNONAA >60 59* >60   < > >60 >60 >60 >60  GFRAA >60 >60 >60  --   --   --   --   --    < > = values in this interval not displayed.    LIVER FUNCTION TESTS: Recent Labs    02/01/21 0600 02/02/21 0515 02/03/21 0215 02/07/21 1035 02/09/21 1102  BILITOT 0.6 0.7 0.6  --  0.6  AST 26 30 31   --  37  ALT 13 13 14   --  16  ALKPHOS 277* 253* 269*  --  220*  PROT 6.4* 6.4* 6.6  --  7.0  ALBUMIN 2.3* 2.3* 2.4* 2.4* 2.7*    Assessment:  History of pseudomyxoma peritonei with multiple operative interventions performed at an outside institution, with draining midline abdominal intraperitoneal abscess. CT-guided drain placed on 01/27/2021. 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  CT today revealing almost resolved abscess collection per Dr Kathlene Cote Unable to get good drain injection evaluation secondary broken drain connection. (see description in exam section).  Will have pt go to hospital for drain exchange and drain injection same day. Scheduler will call pt with time and date Return to clinic for 3 week evaluation .    Signed: Lavonia Drafts, PA-C 02/24/2021, 3:12  PM   Please refer to Dr. Kathlene Cote attestation of this note for management and plan.

## 2021-02-25 ENCOUNTER — Other Ambulatory Visit: Payer: Self-pay

## 2021-02-25 ENCOUNTER — Ambulatory Visit (HOSPITAL_COMMUNITY)
Admission: RE | Admit: 2021-02-25 | Discharge: 2021-02-25 | Disposition: A | Discharge status: Home / Self Care | Source: Ambulatory Visit | Attending: Hematology & Oncology | Admitting: Hematology & Oncology

## 2021-02-25 DIAGNOSIS — Z4803 Encounter for change or removal of drains: Secondary | ICD-10-CM | POA: Diagnosis present

## 2021-02-25 DIAGNOSIS — L02211 Cutaneous abscess of abdominal wall: Secondary | ICD-10-CM | POA: Diagnosis not present

## 2021-02-25 HISTORY — PX: IR CATHETER TUBE CHANGE: IMG717

## 2021-02-25 MED ORDER — IOHEXOL 300 MG/ML  SOLN
50.0000 mL | Freq: Once | INTRAMUSCULAR | Status: AC | PRN
  Administered 2021-02-25: 10 mL

## 2021-02-25 MED ORDER — LIDOCAINE HCL 1 % IJ SOLN
INTRAMUSCULAR | Status: AC
  Administered 2021-02-25: 8 mL via SUBCUTANEOUS
  Filled 2021-02-25: qty 20

## 2021-02-25 NOTE — Procedures (Signed)
Interventional Radiology Procedure Note  Procedure: Fluoroscopic anterior abdominal wall drainage catheter exchange  Findings: Please refer to procedural dictation for full description. Fractured hub on indwelling, well-positioned 16 Fr pigtail drain.  Exchanged over wire successfully for similar catheter.  No definite fistula on sinogram.  Complications: None immediate  Estimated Blood Loss: None  Recommendations: Keep to bag drainage.  Agree with discontinuation of routine flushing.    Keep previously scheduled IR drain clinic follow up appointment later this month.   Ruthann Cancer, MD Pager: [phone]

## 2021-03-01 ENCOUNTER — Other Ambulatory Visit: Payer: Self-pay

## 2021-03-01 ENCOUNTER — Encounter: Payer: Self-pay | Admitting: *Deleted

## 2021-03-01 DIAGNOSIS — C181 Malignant neoplasm of appendix: Secondary | ICD-10-CM

## 2021-03-01 DIAGNOSIS — C772 Secondary and unspecified malignant neoplasm of intra-abdominal lymph nodes: Secondary | ICD-10-CM

## 2021-03-01 MED ORDER — HYDROMORPHONE HCL 4 MG PO TABS: 4.0000 mg | ORAL_TABLET | Freq: Four times a day (QID) | ORAL | 0 refills | Status: AC | PRN

## 2021-03-09 ENCOUNTER — Other Ambulatory Visit: Payer: Self-pay | Admitting: *Deleted

## 2021-03-09 MED ORDER — RIVAROXABAN 20 MG PO TABS: 20.0000 mg | ORAL_TABLET | Freq: Every day | ORAL | 4 refills | Status: DC

## 2021-03-10 ENCOUNTER — Ambulatory Visit
Admission: RE | Admit: 2021-03-10 | Discharge: 2021-03-10 | Disposition: A | Discharge status: Home / Self Care | Source: Ambulatory Visit | Attending: Hematology & Oncology | Admitting: Hematology & Oncology

## 2021-03-10 ENCOUNTER — Encounter: Payer: Self-pay | Admitting: Radiology

## 2021-03-10 DIAGNOSIS — L02211 Cutaneous abscess of abdominal wall: Secondary | ICD-10-CM

## 2021-03-10 HISTORY — PX: IR RADIOLOGIST EVAL & MGMT: IMG5224

## 2021-03-10 NOTE — Progress Notes (Signed)
Referring Physician(s): Ennever,Peter R  Chief Complaint: The patient is seen in follow up today s/p intra-abdominal fluid collection  History of present illness: Wendy Barber is a 60 year old female with history of metastatic appendiceal cacner, pseudomyxoma peritonei, and peritoneal carcinomatosis with CRS/HIPEC who originally presented with wound drainage, diarrhea, and fatigue.  She was found to have an abdominal wall fluid collection and a drain was placed 01/27/21 by Dr. [name]. This was upsized and exchanged 02/02/21 due to persistent fluid collection and was again replaced 02/24/21 due to fracture of the external connection with ongoing drainage. She returns to IR drain clinic today for evaluation.   Wendy Barber reports difficulty with disconnecting the drain with leakage from her skin site since this weekend.  She reports only a small amount of drainage in the gravity bag since this time. She has not flushed or emptied the drain since Saturday.  She reports ongoing intermittent abdominal pain, poor appetite, bloating.  These symptoms are typical for her at times, but are seemingly increased since the exchange of the drain.   Past Medical History:  Diagnosis Date  . Arthritis   . Back pain   . Cancer [facility])    pseudomyxoma peritonei  . 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[place]) 03/19/2020  . Chronic fatigue syndrome   . Diabetes mellitus without complication ([place])   . DVT of deep femoral vein, left ([place]) 03/19/2020  . Fibromyalgia   . Goals of care, counseling/discussion 03/19/2020  . Hypertension   . Malignant pseudomyxoma peritonei ([place]) 03/19/2020  . Presence of IVC filter 03/19/2020  . Pulmonary embolism, bilateral ([place]) 03/19/2020    Past Surgical History:  Procedure Laterality Date  . ABDOMINAL HYSTERECTOMY    . ABDOMINAL SURGERY    . ACHILLES TENDON REPAIR    . APPENDECTOMY    . ARTHROPLASTY    . CARPAL TUNNEL RELEASE    . CHOLECYSTECTOMY    . IR  CATHETER TUBE CHANGE  02/02/2021  . IR CATHETER TUBE CHANGE  02/25/2021  . IR RADIOLOGIST EVAL & MGMT  02/24/2021  . IR US GUIDE BX ASP/DRAIN  11/25/2019  . JOINT REPLACEMENT    . KNEE ARTHROSCOPY    . ORIF ANKLE FRACTURE Right 08/27/2019   Procedure: OPEN REDUCTION INTERNAL FIXATION RIGHT ANKLE FRACTURE;  Surgeon: Renette Butters, MD;  Location: [facility];  Service: Orthopedics;  Laterality: Right;  . perineorrophy    . TONGUE BIOPSY      Allergies: Penicillins, Alprazolam, Ativan [lorazepam], Corticosteroids, Erythromycin, Erythromycin base, Milnacipran hcl, Prednisone, Savella  [milnacipran], Povidone iodine, and Prednisolone  Medications: Prior to Admission medications   Medication Sig Start Date End Date Taking? Authorizing Provider  busPIRone (BUSPAR) 5 MG tablet Take 5 mg by mouth See admin instructions. Takes 1 tablet in the morning, 1 tablet in the afternoon and 2 tablets at night    [provider]  HYDROmorphone (DILAUDID) 4 MG tablet Take 1 tablet (4 mg total) by mouth every 6 (six) hours as needed for severe pain. 03/01/21 03/31/21  Volanda Napoleon, MD  Nystatin (GERHARDT'S BUTT CREAM) CREA Apply 1 application topically 4 (four) times daily as needed for irritation. 02/10/21   Harold Hedge, MD  ondansetron (ZOFRAN) 4 MG tablet Take 2 tablets (8 mg total) by mouth every 6 (six) hours as needed for nausea or vomiting. 02/10/21 03/12/21  Harold Hedge, MD  pantoprazole (PROTONIX) 40 MG tablet Take 40 mg by mouth every morning. 01/01/21  [provider]  polycarbophil (FIBERCON) 625 MG tablet Take 1 tablet (625 mg total) by mouth daily. 02/10/21 03/12/21  Harold Hedge, MD  propranolol (INDERAL) 20 MG tablet Take 1 tablet (20 mg total) by mouth 2 (two) times daily. 02/10/21 03/12/21  Harold Hedge, MD  rivaroxaban (XARELTO) 20 MG TABS tablet Take 1 tablet (20 mg total) by mouth daily with supper. 03/09/21   Volanda Napoleon, MD  RIVAROXABAN [employer]) VTE STARTER PACK (15 & 20  MG) Follow package directions: Take one 15mg  tablet by mouth twice a day. On day 22, switch to one 20mg  tablet once a day. Take with food. 02/10/21   Harold Hedge, MD  saccharomyces boulardii (FLORASTOR) 250 MG capsule Take 1 capsule (250 mg total) by mouth 2 (two) times daily. 02/10/21 03/12/21  Harold Hedge, MD  simethicone (MYLICON) 80 MG chewable tablet Chew 1 tablet (80 mg total) by mouth 4 (four) times daily as needed for flatulence. 02/10/21   Harold Hedge, MD     No family history on file.  Social History   Socioeconomic History  . Marital status: Married    Spouse name: Not on file  . Number of children: 3  . Years of education: Not on file  . Highest education level: Some college, no degree  Occupational History  . Not on file  Tobacco Use  . Smoking status: Never Smoker  . Smokeless tobacco: Never Used  Vaping Use  . Vaping Use: Not on file  Substance and Sexual Activity  . Alcohol use: No  . Drug use: Never  . Sexual activity: Not on file  Other Topics Concern  . Not on file  Social History Narrative   Right handed   One story home   Drinks occasional caffeine   Social Determinants of Health   Financial Resource Strain: Not on file  Food Insecurity: Not on file  Transportation Needs: Not on file  Physical Activity: Not on file  Stress: Not on file  Social Connections: Not on file     Vital Signs: There were no vitals taken for this visit.  Physical Exam  NAD, alert Abdomen: soft, mild generalized tenderness, multiple prior incisions and scars noted all appear intact and healing well. Midline drain in place, 16 Fr. Site intact with small amount of leakage around the drain. Small amount of cloudy yellow fluid in collection bag.   Imaging: No results found.  Labs:  CBC: Recent Labs    02/02/21 0515 02/03/21 0215 02/04/21 0545 02/09/21 1102  WBC 7.9 8.8 8.5 8.2  HGB 10.2* 10.9* 10.5* 12.1  HCT 33.4* 34.7* 33.1* 38.0  PLT 251 385 351 359     COAGS: Recent Labs    02/02/21 0515  INR 1.2    BMP: Recent Labs    03/19/20 1109 05/01/20 1156 07/27/20 0922 10/28/20 0956 02/06/21 0536 02/07/21 1035 02/09/21 1102 02/10/21 0541  NA 143 141 143   < > 141 140 141 141  K 3.6 3.7 4.1   < > 3.2* 3.6 3.1* 3.5  CL 105 104 103   < > 106 105 105 106  CO2 30 27 33*   < > 25 27 27 27   GLUCOSE 133* 102* 160*   < > 121* 116* 127* 130*  BUN 16 21* 19   < > 6 8 7 8   CALCIUM 10.2 10.5* 10.4*   < > 8.8* 8.6* 9.0 8.6*  CREATININE 0.78 1.05* 0.83   < >  0.55 0.54 0.64 0.65  GFRNONAA >60 59* >60   < > >60 >60 >60 >60  GFRAA >60 >60 >60  --   --   --   --   --    < > = values in this interval not displayed.    LIVER FUNCTION TESTS: Recent Labs    02/01/21 0600 02/02/21 0515 02/03/21 0215 02/07/21 1035 02/09/21 1102  BILITOT 0.6 0.7 0.6  --  0.6  AST 26 30 31   --  37  ALT 13 13 14   --  16  ALKPHOS 277* 253* 269*  --  220*  PROT 6.4* 6.4* 6.6  --  7.0  ALBUMIN 2.3* 2.3* 2.4* 2.4* 2.7*    Assessment: Metastatic appendiceal cancer, pseudomyxoma peritonei and peritoneal carcinomatosis with CRS/HIPEC s/p abdominal wall drain placement 02/02/21 by Dr. [name] Patient presents for ongoing drain evaluation. Her drain was last exchanged 02/24/21 due to drain malfunction with ongoing drainage. At that time no fistulous connection was identified on injection.  She returns to clinic today reporting difficulty with disconnection for flushing. Small amount of leakage from skin site.  She has had minimal drainage since Saturday.  Repeat drain injection today shows no fistulous connection with spillage of contrast back through the tract to the skin.  Imaging reviewed by Dr Vernard Gambles who recommends drain removal.  Drain removed at bedside in its entirety without complication.  Dressing placed. Care instructions given.  No follow-up with IR needed at this time.    Signed: Docia Barrier, PA 03/10/2021, 1:31 PM   Please refer to Dr.  Vernard Gambles attestation of this note for management and plan.

## 2021-03-12 ENCOUNTER — Other Ambulatory Visit: Payer: Self-pay | Admitting: *Deleted

## 2021-03-12 DIAGNOSIS — C181 Malignant neoplasm of appendix: Secondary | ICD-10-CM

## 2021-03-12 DIAGNOSIS — D500 Iron deficiency anemia secondary to blood loss (chronic): Secondary | ICD-10-CM

## 2021-03-12 DIAGNOSIS — C772 Secondary and unspecified malignant neoplasm of intra-abdominal lymph nodes: Secondary | ICD-10-CM

## 2021-03-15 ENCOUNTER — Other Ambulatory Visit: Payer: Self-pay | Admitting: *Deleted

## 2021-03-15 ENCOUNTER — Encounter: Payer: Self-pay | Admitting: Hematology & Oncology

## 2021-03-15 ENCOUNTER — Inpatient Hospital Stay: Attending: Hematology & Oncology

## 2021-03-15 ENCOUNTER — Inpatient Hospital Stay (HOSPITAL_BASED_OUTPATIENT_CLINIC_OR_DEPARTMENT_OTHER): Admitting: Hematology & Oncology

## 2021-03-15 ENCOUNTER — Other Ambulatory Visit: Payer: Self-pay

## 2021-03-15 VITALS — BP 179/90 | HR 87 | Temp 100.2°F | Resp 24 | Wt 217.8 lb

## 2021-03-15 DIAGNOSIS — R634 Abnormal weight loss: Secondary | ICD-10-CM | POA: Diagnosis not present

## 2021-03-15 DIAGNOSIS — Z79899 Other long term (current) drug therapy: Secondary | ICD-10-CM | POA: Insufficient documentation

## 2021-03-15 DIAGNOSIS — R350 Frequency of micturition: Secondary | ICD-10-CM

## 2021-03-15 DIAGNOSIS — Z7901 Long term (current) use of anticoagulants: Secondary | ICD-10-CM | POA: Diagnosis not present

## 2021-03-15 DIAGNOSIS — C181 Malignant neoplasm of appendix: Secondary | ICD-10-CM | POA: Diagnosis not present

## 2021-03-15 DIAGNOSIS — R300 Dysuria: Secondary | ICD-10-CM | POA: Insufficient documentation

## 2021-03-15 DIAGNOSIS — F32A Depression, unspecified: Secondary | ICD-10-CM | POA: Insufficient documentation

## 2021-03-15 DIAGNOSIS — Z880 Allergy status to penicillin: Secondary | ICD-10-CM | POA: Diagnosis not present

## 2021-03-15 DIAGNOSIS — K55059 Acute (reversible) ischemia of intestine, part and extent unspecified: Secondary | ICD-10-CM | POA: Diagnosis not present

## 2021-03-15 DIAGNOSIS — R5383 Other fatigue: Secondary | ICD-10-CM | POA: Insufficient documentation

## 2021-03-15 DIAGNOSIS — Z86718 Personal history of other venous thrombosis and embolism: Secondary | ICD-10-CM | POA: Insufficient documentation

## 2021-03-15 DIAGNOSIS — R109 Unspecified abdominal pain: Secondary | ICD-10-CM | POA: Diagnosis not present

## 2021-03-15 DIAGNOSIS — M549 Dorsalgia, unspecified: Secondary | ICD-10-CM | POA: Diagnosis not present

## 2021-03-15 DIAGNOSIS — C786 Secondary malignant neoplasm of retroperitoneum and peritoneum: Secondary | ICD-10-CM | POA: Diagnosis not present

## 2021-03-15 DIAGNOSIS — C772 Secondary and unspecified malignant neoplasm of intra-abdominal lymph nodes: Secondary | ICD-10-CM

## 2021-03-15 DIAGNOSIS — R197 Diarrhea, unspecified: Secondary | ICD-10-CM | POA: Diagnosis not present

## 2021-03-15 LAB — COMPREHENSIVE METABOLIC PANEL
ALT: 16 U/L (ref 0–44)
AST: 23 U/L (ref 15–41)
Albumin: 3.3 g/dL — ABNORMAL LOW (ref 3.5–5.0)
Alkaline Phosphatase: 193 U/L — ABNORMAL HIGH (ref 38–126)
Anion gap: 6 (ref 5–15)
BUN: 14 mg/dL (ref 6–20)
CO2: 31 mmol/L (ref 22–32)
Calcium: 9.8 mg/dL (ref 8.9–10.3)
Chloride: 103 mmol/L (ref 98–111)
Creatinine, Ser: 0.63 mg/dL (ref 0.44–1.00)
GFR, Estimated: >60 mL/min (ref >60)
Glucose, Bld: 159 mg/dL — ABNORMAL HIGH (ref 70–99)
Potassium: 3.4 mmol/L — ABNORMAL LOW (ref 3.5–5.1)
Sodium: 140 mmol/L (ref 135–145)
Total Bilirubin: 0.5 mg/dL (ref 0.3–1.2)
Total Protein: 7.2 g/dL (ref 6.5–8.1)

## 2021-03-15 LAB — URINALYSIS, COMPLETE (UACMP) WITH MICROSCOPIC
Glucose, UA: 100 mg/dL — AB
Ketones, ur: NEGATIVE mg/dL
Nitrite: POSITIVE — AB
Protein, ur: 30 mg/dL — AB
RBC / HPF: >50 RBC/hpf (ref 0–5)
Specific Gravity, Urine: 1.02 (ref 1.005–1.030)
pH: 6.5 (ref 5.0–8.0)

## 2021-03-15 LAB — CBC WITH DIFFERENTIAL (CANCER CENTER ONLY)
Abs Immature Granulocytes: 0.16 10*3/uL — ABNORMAL HIGH (ref 0.00–0.07)
Basophils Absolute: 0 10*3/uL (ref 0.0–0.1)
Basophils Relative: 0 %
Eosinophils Absolute: 0.6 10*3/uL — ABNORMAL HIGH (ref 0.0–0.5)
Eosinophils Relative: 6 %
HCT: 36.8 % (ref 36.0–46.0)
Hemoglobin: 12 g/dL (ref 12.0–15.0)
Immature Granulocytes: 2 %
Lymphocytes Relative: 23 %
Lymphs Abs: 2.3 10*3/uL (ref 0.7–4.0)
MCH: 30.9 pg (ref 26.0–34.0)
MCHC: 32.6 g/dL (ref 30.0–36.0)
MCV: 94.8 fL (ref 80.0–100.0)
Monocytes Absolute: 0.5 10*3/uL (ref 0.1–1.0)
Monocytes Relative: 5 %
Neutro Abs: 6.6 10*3/uL (ref 1.7–7.7)
Neutrophils Relative %: 64 %
Platelet Count: 322 10*3/uL (ref 150–400)
RBC: 3.88 MIL/uL (ref 3.87–5.11)
RDW: 15.9 % — ABNORMAL HIGH (ref 11.5–15.5)
WBC Count: 10.1 10*3/uL (ref 4.0–10.5)
nRBC: 0 % (ref 0.0–0.2)

## 2021-03-15 MED ORDER — FAMOTIDINE 20 MG PO TABS: 40.0000 mg | ORAL_TABLET | Freq: Two times a day (BID) | ORAL | 3 refills | Status: DC

## 2021-03-15 NOTE — Progress Notes (Signed)
Hematology and Oncology Follow Up Visit  Wendy Barber [id number] 10-Sep-1961 60 y.o. 03/15/2021   Principle Diagnosis:   History of metastatic appendiceal cancer  -- recurrent   Superior mesenteric vein thrombus  Tthromboembolism of the RIGHT leg  Current Therapy:    HIPEC - Surgery done in [place] in 11/2020  Xarelto 20 mg p.o. daily     Interim History:  Wendy Barber is back for follow-up.  Unfortunately, since we last saw her, she has had a lot happen.  She actually had recurrence of her appendiceal cancer.  She went up to [place] where she had her initial surgery about 20 years ago.  She was seen by her surgeon- Dr. Sharon Mt -went ahead and did another HIPEC procedure.  Unfortunately, she has not done well from this.  She came back to [place].  She basically was hospitalized for almost a month.  She had bad diarrhea.  She had C. difficile.  She then had an abscess.  She had a fistula.  She required IR intervention.  She had drainage is putting.  She was on antibiotics.  She had a lot of pain.  She ultimately was finally discharged from the hospital.  She has been followed by IR.  They have done a great job.  They have done scans.  It looks like the abscess has drained.  Look at the fistula that she was developing has closed.  She has no drains in her now.  However, diarrhea is still a problem.  How much are what else we can do for the diarrhea.  She is on Imodium.  She takes Lomotil as needed.  She is just very frustrated.  She just does not feel well.  She has lost weight.  Her appetite is not back wonderful.  She has not yet gone back up to [place] to be seen by her Psychologist, sport and exercise.  She has had no bleeding.  I forgot to mention that she did develop a thrombus in the hospital.  She had a mesenteric vein thrombus.  She had a acute thrombus in the right leg.  She is on Xarelto.  Nothing were  To keep her on Xarelto for quite a while.  Her pain seems to be doing pretty  well.  She only takes pain medication on an as-needed basis.  She has had no rashes.  There is been no cough..  She thinks she may have a urinary tract infection.  We did send off a urinalysis on her.  We will have to see what this shows.  She is quite depressed.  She is on BuSpar.  I think that her family doctor might be able to help with this.  Currently, her performance status is ECOG 2.    Medications:  Current Outpatient Medications:  .  busPIRone (BUSPAR) 5 MG tablet, Take 5 mg by mouth See admin instructions. Takes 1 tablet in the morning, 1 tablet in the afternoon and 2 tablets at night, Disp: , Rfl:  .  HYDROmorphone (DILAUDID) 4 MG tablet, Take 1 tablet (4 mg total) by mouth every 6 (six) hours as needed for severe pain., Disp: 120 tablet, Rfl: 0 .  ondansetron (ZOFRAN) 8 MG tablet, Take 8 mg by mouth every 8 (eight) hours as needed., Disp: , Rfl:  .  pantoprazole (PROTONIX) 40 MG tablet, Take 40 mg by mouth every morning., Disp: , Rfl:  .  promethazine (PHENERGAN) 12.5 MG tablet, Take 12.5 mg by mouth 2 (two) times daily as needed., Disp: ,  Rfl:  .  rivaroxaban (XARELTO) 20 MG TABS tablet, Take 1 tablet (20 mg total) by mouth daily with supper., Disp: 30 tablet, Rfl: 4 .  simethicone (MYLICON) 80 MG chewable tablet, Chew 1 tablet (80 mg total) by mouth 4 (four) times daily as needed for flatulence., Disp: 30 tablet, Rfl: 0 .  Nystatin (GERHARDT'S BUTT CREAM) CREA, Apply 1 application topically 4 (four) times daily as needed for irritation. (Patient not taking: Reported on 03/15/2021), Disp: 1 each, Rfl: 0 .  propranolol (INDERAL) 20 MG tablet, Take 1 tablet (20 mg total) by mouth 2 (two) times daily., Disp: 60 tablet, Rfl: 0 .  RIVAROXABAN (XARELTO) VTE STARTER PACK (15 & 20 MG), Follow package directions: Take one 15mg  tablet by mouth twice a day. On day 22, switch to one 20mg  tablet once a day. Take with food., Disp: 51 each, Rfl: 0  Allergies:  Allergies  Allergen Reactions  .  Penicillins Shortness Of Breath    Other reaction(s): Irregular Heart Rate, Other (See Comments) Rapid heartrate   . Alprazolam Hives and Other (See Comments)    Note: tolerates midazolam fine unresponsive Hard to arouse unresponsive Hard to arouse  Other reaction(s): Other (See Comments) unresponsive  . Ativan [Lorazepam] Other (See Comments)    Note: tolerates midazolam fine Face & Throat Swelling.  . Corticosteroids Other (See Comments)    Other reaction(s): Other (see comments) Psychotic behaviour    . Erythromycin     Other reaction(s): Other (See Comments) Severe stomach pain   . Erythromycin Base Hives  . Milnacipran Hcl Other (See Comments)    Other reaction(s):  psychotic episode     . Prednisone Other (See Comments)    Anxiety & Nervous Breakdown.  Wendy Barber  [Milnacipran]   . Povidone Iodine Rash    Had rash under bandage after surgery , has had betadine since without reaction   . Prednisolone Anxiety    Past Medical History, Surgical history, Social history, and Family History were reviewed and updated.  Review of Systems: Review of Systems  Constitutional: Positive for fatigue and unexpected weight change.  HENT:  Negative.   Eyes: Negative.   Respiratory: Negative.   Cardiovascular: Negative.   Gastrointestinal: Positive for abdominal pain and diarrhea.  Endocrine: Negative.   Genitourinary: Positive for dysuria.   Musculoskeletal: Positive for back pain.  Skin: Negative.   Neurological: Negative.   Hematological: Negative.   Psychiatric/Behavioral: Positive for depression.    Physical Exam:  weight is 217 lb 12.8 oz (98.8 kg). Her temperature is 100.2 F (37.9 C). Her blood pressure is 179/90 (abnormal) and her pulse is 87. Her respiration is 24 (abnormal) and oxygen saturation is 98%.   Wt Readings from Last 3 Encounters:  03/15/21 217 lb 12.8 oz (98.8 kg)  02/05/21 229 lb 15 oz (104.3 kg)  07/27/20 248 lb 1.9 oz (112.5 kg)     Physical Exam Vitals reviewed.  HENT:     Head: Normocephalic and atraumatic.  Eyes:     Pupils: Pupils are equal, round, and reactive to light.  Cardiovascular:     Rate and Rhythm: Normal rate and regular rhythm.     Heart sounds: Normal heart sounds.  Pulmonary:     Effort: Pulmonary effort is normal.     Breath sounds: Normal breath sounds.  Abdominal:     General: Bowel sounds are normal.     Palpations: Abdomen is soft.     Comments: Abdominal exam shows healed laparotomy scar.  She has no fluid wave.  There is no guarding or rebound tenderness.  She has decent bowel sounds.  There is no palpable liver or spleen tip.  Musculoskeletal:        General: No tenderness or deformity. Normal range of motion.     Cervical back: Normal range of motion.  Lymphadenopathy:     Cervical: No cervical adenopathy.  Skin:    General: Skin is warm and dry.     Findings: No erythema or rash.  Neurological:     Mental Status: She is alert and oriented to person, place, and time.  Psychiatric:        Behavior: Behavior normal.        Thought Content: Thought content normal.        Judgment: Judgment normal.      Lab Results  Component Value Date   WBC 10.1 03/15/2021   HGB 12.0 03/15/2021   HCT 36.8 03/15/2021   MCV 94.8 03/15/2021   PLT 322 03/15/2021     Chemistry      Component Value Date/Time   NA 140 03/15/2021 1402   K 3.4 (L) 03/15/2021 1402   CL 103 03/15/2021 1402   CO2 31 03/15/2021 1402   BUN 14 03/15/2021 1402   CREATININE 0.63 03/15/2021 1402   CREATININE 0.72 10/28/2020 0956      Component Value Date/Time   CALCIUM 9.8 03/15/2021 1402   ALKPHOS 193 (H) 03/15/2021 1402   AST 23 03/15/2021 1402   AST 35 10/28/2020 0956   ALT 16 03/15/2021 1402   ALT 28 10/28/2020 0956   BILITOT 0.5 03/15/2021 1402   BILITOT 0.5 10/28/2020 0956      Impression and Plan: Ms. Vitale is a very charming 60 year-old white female.  She has incredible history.  She now  has recurrence of her appendiceal cancer.  I am just absolutely amazed by this.  It has been about I guess 15 years or so since she had her initial appendiceal cancer removed.  I am trying to look for the pathology report.  I am sure that 1 has to be available.  It would be nice to see if there is any indication for any type of therapy.  However, I do not know if she is in any type of condition to take any type of systemic therapy.  It would be nice if she could go back up to [facility] to see her Psychologist, sport and exercise.  I do not think that they can see her until September.  For right now, we will does have to follow her closely here.  I do have to try to get the pathology report.  Maybe, we can somehow get a hold of this.  We will continue her on the Xarelto.  Hopefully the diarrhea will improve.  Told her to stop the Protonix and maybe switch over to Pepcid might help.  I know that she has had an incredible tough time after surgery.  I know she had C. difficile in the hospital.  This took a lot out of her.  She is worried about having this again.  We will see what the urinalysis has to show.  If there is any evidence of infection, we might have to think about antibiotics.  Of course, she has a lot of allergies.  This is incredibly complicated.  I would like to see her back in another 3 weeks or so.    I spent about 45 minutes with her today.  Volanda Napoleon, MD 5/23/20222:47 PM

## 2021-03-17 ENCOUNTER — Other Ambulatory Visit: Payer: Self-pay

## 2021-03-17 DIAGNOSIS — R350 Frequency of micturition: Secondary | ICD-10-CM

## 2021-03-17 MED ORDER — SULFAMETHOXAZOLE-TRIMETHOPRIM 800-160 MG PO TABS: 1.0000 | ORAL_TABLET | Freq: Two times a day (BID) | ORAL | 0 refills | Status: DC

## 2021-03-18 ENCOUNTER — Other Ambulatory Visit

## 2021-03-18 ENCOUNTER — Encounter: Payer: Self-pay | Admitting: Hematology & Oncology

## 2021-03-23 ENCOUNTER — Other Ambulatory Visit: Payer: Self-pay | Admitting: Surgery

## 2021-03-24 ENCOUNTER — Other Ambulatory Visit: Payer: Self-pay | Admitting: *Deleted

## 2021-03-24 MED ORDER — FAMOTIDINE 20 MG PO TABS: 40.0000 mg | ORAL_TABLET | Freq: Two times a day (BID) | ORAL | 3 refills | Status: DC

## 2021-04-10 LAB — HM DIABETES EYE EXAM

## 2021-04-20 ENCOUNTER — Ambulatory Visit: Admitting: Hematology & Oncology

## 2021-04-20 ENCOUNTER — Other Ambulatory Visit

## 2021-05-05 ENCOUNTER — Ambulatory Visit (INDEPENDENT_AMBULATORY_CARE_PROVIDER_SITE_OTHER): Payer: TRICARE For Life (TFL) | Admitting: Family Medicine

## 2021-05-05 ENCOUNTER — Encounter: Payer: Self-pay | Admitting: Family Medicine

## 2021-05-05 ENCOUNTER — Encounter: Payer: Self-pay | Admitting: *Deleted

## 2021-05-05 ENCOUNTER — Other Ambulatory Visit: Payer: Self-pay

## 2021-05-05 DIAGNOSIS — I2699 Other pulmonary embolism without acute cor pulmonale: Secondary | ICD-10-CM | POA: Diagnosis not present

## 2021-05-05 DIAGNOSIS — F418 Other specified anxiety disorders: Secondary | ICD-10-CM

## 2021-05-05 DIAGNOSIS — C181 Malignant neoplasm of appendix: Secondary | ICD-10-CM | POA: Diagnosis not present

## 2021-05-05 DIAGNOSIS — C772 Secondary and unspecified malignant neoplasm of intra-abdominal lymph nodes: Secondary | ICD-10-CM

## 2021-05-05 DIAGNOSIS — Z86718 Personal history of other venous thrombosis and embolism: Secondary | ICD-10-CM | POA: Diagnosis not present

## 2021-05-05 DIAGNOSIS — G250 Essential tremor: Secondary | ICD-10-CM

## 2021-05-05 MED ORDER — FLUOXETINE HCL 10 MG PO CAPS: 10.0000 mg | ORAL_CAPSULE | Freq: Every day | ORAL | 1 refills | Status: DC

## 2021-05-05 NOTE — Patient Instructions (Addendum)
I do recommend establishing again with counselor, here are a few options:  Here are a few options for counseling:  [facility]:  [place]  [phone]  Start low dose prozac once per day, no change in buspar needed, but watch for serotonin symptoms we discussed. Option to decrease nighttime dose or back off to 2 times per day buspar.  Recheck in 3-4 weeks.   I do recommend follow up with gastroenterology to discuss meds and repeat testing.   Thanks for coming in today.   Return to the clinic or go to the nearest emergency room if any of your symptoms worsen or new symptoms occur.

## 2021-05-05 NOTE — Progress Notes (Signed)
Subjective:  Patient ID: Wendy Barber, female    DOB: 11-07-1960  Age: 60 y.o. MRN: [id number]  CC:  Chief Complaint  Patient presents with   Establish Care    Pt here to establish care today    HPI Wendy Barber presents for   New patient to establish care. Some difficulty with communication with prior PCP office.  Moved to [place] in 2020, moved to be close to [place]- started church locally - nondenomination.  Oldest son in [place].  Son lives in [place].  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   Care team:  Oncology Dr. [name] General surgery: Dr. Ninfa Linden Endocrinology, Dr. Nehemiah Settle, diabetes.  Rheumatology Dr. Kathlene November, fibromyalgia, RA? Neurology, Dr. Tomi Likens, essential tremor, benign meningioma. Therapist/counseling, Wendy Barber - few visits, no recent visit.  Pain, Wendy Barber - prior, not recent. Dr. [name] has prescribed dilaudid rarely now - only med that has helped.  Sleep specialist, Dr. Ellouise Newer - ? Sleep apnea in past, repeat test after weight loss ok.  Cardiology, Dr. Claudie Leach - workup prior to surgery.  Gastroenterology, Dr. Alan Ripper at [facility]. Hx of autoimmune hepatitis with cirrhosis. Off PPI, H2. Has not had follow up since hospitalization.  Surgical oncologist in [place], Dr. Sharon Mt   Previous primary care provider, Dr. Glendon Axe   Oncology: Treated by Dr. [name], last office visit May 23 noted.  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  Initial surgery reportedly in [place], [place] with splenectomy in 2005, recent CRS/Hipec 12/10/2020 for recurrence. Dr. Sharon Mt in [place].  Subsequently had hospitalization locally March 26 through April 20 for C. difficile, then abscess, fistula with IR intervention. required IR drainage of abscess.  Required treatment with oral vancomycin, cefdinir and IV Flagyl, enterocutaneous fistula, plan for outpatient follow-up with her surgeon. Unfortunately found to have acute DVT, with history  of PE, and IVC filter.  Lovenox switched to heparin drip and then Xarelto.   Pain managed with as needed medication at May 23 oncology visit. Followed by infectious disease posthospitalization -Dr. Linus Salmons, April 29 visit, continue vancomycin for 1 additional week, and antidiarrheals. General surgeon, Dr. Ninfa Linden. Some hair loss after HIPEC, specialist discussed hormone testing - requesting labs from prior PCP.    Diabetes: Diet controlled, off 70# past year. Off meds/insulin.  Lab Results  Component Value Date   HGBA1C 5.8 (H) 01/16/2021   Lab Results  Component Value Date   CREATININE 0.63 03/15/2021   Anxiety Treated with BuSpar 5 mg QAM, QPM, 10mg  QHS. Unknown prior SSRI.  Prior to surgery, more anxiety. Feels more depressed now since repeat surgery. Anxiety is better on buspar.  Concern for anxiety and depression at her oncology visit, recommended further discussion with primary care provider.  Prior therapist, not recent. Would like different therapist.  Fleeting thoughts of wishing to die due to sickness since surgery, but specific suicidal plan, intent or plan.  Depression screen [facility] 2/9 05/05/2021 02/19/2021  Decreased Interest 2 3  Down, Depressed, Hopeless 3 3  PHQ - 2 Score 5 6  Altered sleeping 3 3  Tired, decreased energy 2 3  Change in appetite 3 3  Feeling bad or failure about yourself  3 3  Trouble concentrating 3 3  Moving slowly or fidgety/restless 0 3  Suicidal thoughts 1 1  PHQ-9 Score 20 25  Difficult doing work/chores - Extremely dIfficult    Essential tremor Treated with propranolol. Neuro follow up as above.   History Patient Active Problem List   Diagnosis Date Noted  Abdominal wall abscess    Hypokalemia    Surgical wound infection 01/16/2021   C. 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[place]) 03/19/2020   Goals of care, counseling/discussion 03/19/2020   Malignant pseudomyxoma peritonei ([place])  03/19/2020   DVT of deep femoral vein, left ([place]) 03/19/2020   Pulmonary embolism, bilateral ([place]) 03/19/2020   Presence of IVC filter 03/19/2020   Hepatic encephalopathy ([place]) 11/22/2019   Confusion 11/21/2019   Autoimmune hepatitis ([place]) 11/21/2019   HTN (hypertension) 11/21/2019   DM2 (diabetes mellitus, type 2) ([place]) 11/21/2019   AMS (altered mental status) 11/21/2019   Closed right ankle fracture 08/27/2019   Past Medical History:  Diagnosis Date   Arthritis    Back pain    Cancer [facility])    pseudomyxoma peritonei   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[place]) 03/19/2020   Chronic fatigue syndrome    Diabetes mellitus without complication (HCC)    DVT of deep femoral vein, left ([place]) 03/19/2020   Fibromyalgia    Goals of care, counseling/discussion 03/19/2020   Hypertension    Malignant pseudomyxoma peritonei ([place]) 03/19/2020   Presence of IVC filter 03/19/2020   Pulmonary embolism, bilateral ([place]) 03/19/2020   Past Surgical History:  Procedure Laterality Date   ABDOMINAL HYSTERECTOMY     ABDOMINAL SURGERY     ACHILLES TENDON REPAIR     APPENDECTOMY     ARTHROPLASTY     CARPAL TUNNEL RELEASE     CHOLECYSTECTOMY     IR CATHETER TUBE CHANGE  02/02/2021   IR CATHETER TUBE CHANGE  02/25/2021   IR RADIOLOGIST EVAL & MGMT  02/24/2021   IR RADIOLOGIST EVAL & MGMT  03/10/2021   IR US GUIDE BX ASP/DRAIN  11/25/2019   JOINT REPLACEMENT     KNEE ARTHROSCOPY     ORIF ANKLE FRACTURE Right 08/27/2019   Procedure: OPEN REDUCTION INTERNAL FIXATION RIGHT ANKLE FRACTURE;  Surgeon: Wendy Butters, MD;  Location: [facility];  Service: Orthopedics;  Laterality: Right;   perineorrophy     TONGUE BIOPSY     Allergies  Allergen Reactions   Penicillins Shortness Of Breath    Other reaction(s): Irregular Heart Rate, Other (See Comments) Rapid heartrate    Alprazolam Hives and Other (See Comments)    Note: tolerates midazolam fine unresponsive Hard to  arouse unresponsive Hard to arouse  Other reaction(s): Other (See Comments) unresponsive   Ativan [Lorazepam] Other (See Comments)    Note: tolerates midazolam fine Face & Throat Swelling.   Corticosteroids Other (See Comments)    Other reaction(s): Other (see comments) Psychotic behaviour     Erythromycin     Other reaction(s): Other (See Comments) Severe stomach pain    Erythromycin Base Hives   Milnacipran Hcl Other (See Comments)    Other reaction(s):  psychotic episode      Prednisone Other (See Comments)    Anxiety & Nervous Breakdown.   Savella  [Milnacipran]    Povidone Iodine Rash    Had rash under bandage after surgery , has had betadine since without reaction    Prednisolone Anxiety   Prior to Admission medications   Medication Sig Start Date End Date Taking? Authorizing Provider  busPIRone (BUSPAR) 5 MG tablet Take 5 mg by mouth See admin instructions. Takes 1 tablet in the morning, 1 tablet in the afternoon and 2 tablets at night   Yes [provider]  famotidine (PEPCID) 20 MG tablet Take 2  tablets (40 mg total) by mouth 2 (two) times daily. 03/24/21  Yes Volanda Napoleon, MD  HYDROmorphone (DILAUDID) 4 MG tablet Take 4 mg by mouth every 6 (six) hours as needed for severe pain.   Yes [provider]  loperamide (IMODIUM) 2 MG capsule Take 4 mg by mouth as needed for diarrhea or loose stools.   Yes [provider]  Melatonin 1 MG CHEW Chew 5 mg by mouth.   Yes [provider]  ondansetron (ZOFRAN) 8 MG tablet Take 8 mg by mouth every 8 (eight) hours as needed. 03/03/21  Yes [provider]  pantoprazole (PROTONIX) 40 MG tablet Take 40 mg by mouth every morning. 01/01/21  Yes [provider]  promethazine (PHENERGAN) 12.5 MG tablet Take 12.5 mg by mouth 2 (two) times daily as needed. 03/04/21  Yes [provider]  rivaroxaban (XARELTO) 20 MG TABS tablet Take 1 tablet (20 mg total) by mouth daily with  supper. 03/09/21  Yes Volanda Napoleon, MD  diphenoxylate-atropine (LOMOTIL) 2.5-0.025 MG tablet Take 2 tablets by mouth in the morning, at noon, in the evening, and at bedtime. 04/01/21   [provider]  Nystatin (GERHARDT'S BUTT CREAM) CREA Apply 1 application topically 4 (four) times daily as needed for irritation. Patient not taking: Reported on 03/15/2021 02/10/21   Wendy Hedge, MD  propranolol (INDERAL) 20 MG tablet Take 1 tablet (20 mg total) by mouth 2 (two) times daily. 02/10/21 03/12/21  Wendy Hedge, MD  simethicone (MYLICON) 80 MG chewable tablet Chew 1 tablet (80 mg total) by mouth 4 (four) times daily as needed for flatulence. Patient not taking: Reported on 05/05/2021 02/10/21   Wendy Hedge, MD  sulfamethoxazole-trimethoprim (BACTRIM DS) 800-160 MG tablet Take 1 tablet by mouth 2 (two) times daily. For three days 03/17/21   Volanda Napoleon, MD   Social History   Socioeconomic History   Marital status: Married    Spouse name: Not on file   Number of children: 3   Years of education: Not on file   Highest education Barber: Some college, no degree  Occupational History   Not on file  Tobacco Use   Smoking status: Never   Smokeless tobacco: Never  Vaping Use   Vaping Use: Never used  Substance and Sexual Activity   Alcohol use: No   Drug use: Never   Sexual activity: Yes    Birth control/protection: None    Comment: hysterectomy  Other Topics Concern   Not on file  Social History Narrative   Right handed   One story home   Drinks occasional caffeine   Social Determinants of Health   Financial Resource Strain: Not on file  Food Insecurity: Not on file  Transportation Needs: Not on file  Physical Activity: Not on file  Stress: Not on file  Social Connections: Not on file  Intimate Partner Violence: Not on file    Review of Systems Per HPI  Objective:  There were no vitals filed for this visit.   Physical Exam Vitals reviewed.  Constitutional:       General: She is not in acute distress.    Appearance: Normal appearance. She is well-developed. She is not toxic-appearing.  HENT:     Head: Normocephalic and atraumatic.  Neck:     Vascular: No carotid bruit.  Cardiovascular:     Rate and Rhythm: Normal rate and regular rhythm.     Heart sounds: Normal heart sounds.  Pulmonary:  Effort: Pulmonary effort is normal.     Breath sounds: Normal breath sounds.  Abdominal:     Palpations: There is no pulsatile mass.  Musculoskeletal:     Right lower leg: No edema.     Left lower leg: No edema.  Skin:    General: Skin is warm and dry.  Neurological:     Mental Status: She is alert and oriented to person, place, and time.  Psychiatric:        Attention and Perception: Attention and perception normal.        Mood and Affect: Mood is depressed.        Speech: Speech normal.        Behavior: Behavior normal.        Thought Content: Thought content does not include homicidal or suicidal ideation. Thought content does not include homicidal or suicidal plan.       Assessment & Plan:  Lacresia Darwish is a 60 y.o. female . 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[place])  -Continue ongoing care with her specialist including oncology, general surgery, and recommended follow-up with gastroenterology given history of autoimmune hepatitis for ongoing labs as well as for medication review.  Depression with anxiety -: FLUoxetine (PROZAC) 10 MG capsule  -Decreased control symptoms, will add fluoxetine initially 10 mg daily given current use of buspirone.  Can taper back to twice daily dosing of buspirone and signs/symptoms of serotonin syndrome were discussed with RTC/ER precautions.  Denies true suicidal ideation but ER, 911, suicide hotline precautions discussed.  -Counseling recommended with phone numbers provided. Pulmonary embolism, bilateral (HCC) History of DVT (deep vein thrombosis)  -Xarelto, monitor for new signs  or symptoms of bleeding or breakthrough DVT.  Essential tremor  -Treated with propranolol.  Meds ordered this encounter  Medications   FLUoxetine (PROZAC) 10 MG capsule    Sig: Take 1 capsule (10 mg total) by mouth daily.    Dispense:  90 capsule    Refill:  1   Patient Instructions  I do recommend establishing again with counselor, here are a few options:  Here are a few options for counseling:  [place]:  [place]  [phone]  Start low dose prozac once per day, no change in buspar needed, but watch for serotonin symptoms we discussed. Option to decrease nighttime dose or back off to 2 times per day buspar.  Recheck in 3-4 weeks.   I do recommend follow up with gastroenterology to discuss meds and repeat testing.   Thanks for coming in today.   Return to the clinic or go to the nearest emergency room if any of your symptoms worsen or new symptoms occur.     Signed,   Wendy Ray, MD [facility] 05/05/21 7:36 PM  59 minutes spent during visit, including chart review, counseling and assimilation of information, exam, discussion of plan, and chart completion.

## 2021-05-13 ENCOUNTER — Other Ambulatory Visit: Payer: Self-pay

## 2021-05-13 ENCOUNTER — Encounter: Payer: Self-pay | Admitting: Hematology & Oncology

## 2021-05-13 ENCOUNTER — Inpatient Hospital Stay: Attending: Hematology & Oncology

## 2021-05-13 ENCOUNTER — Inpatient Hospital Stay (HOSPITAL_BASED_OUTPATIENT_CLINIC_OR_DEPARTMENT_OTHER): Admitting: Hematology & Oncology

## 2021-05-13 ENCOUNTER — Telehealth: Payer: Self-pay

## 2021-05-13 VITALS — BP 174/83 | HR 59 | Temp 98.1°F | Resp 16 | Wt 212.0 lb

## 2021-05-13 DIAGNOSIS — R5383 Other fatigue: Secondary | ICD-10-CM | POA: Diagnosis not present

## 2021-05-13 DIAGNOSIS — F32A Depression, unspecified: Secondary | ICD-10-CM | POA: Diagnosis not present

## 2021-05-13 DIAGNOSIS — I82401 Acute embolism and thrombosis of unspecified deep veins of right lower extremity: Secondary | ICD-10-CM | POA: Diagnosis not present

## 2021-05-13 DIAGNOSIS — E538 Deficiency of other specified B group vitamins: Secondary | ICD-10-CM | POA: Insufficient documentation

## 2021-05-13 DIAGNOSIS — C772 Secondary and unspecified malignant neoplasm of intra-abdominal lymph nodes: Secondary | ICD-10-CM

## 2021-05-13 DIAGNOSIS — N390 Urinary tract infection, site not specified: Secondary | ICD-10-CM | POA: Diagnosis not present

## 2021-05-13 DIAGNOSIS — I82412 Acute embolism and thrombosis of left femoral vein: Secondary | ICD-10-CM

## 2021-05-13 DIAGNOSIS — D510 Vitamin B12 deficiency anemia due to intrinsic factor deficiency: Secondary | ICD-10-CM | POA: Diagnosis not present

## 2021-05-13 DIAGNOSIS — R300 Dysuria: Secondary | ICD-10-CM | POA: Insufficient documentation

## 2021-05-13 DIAGNOSIS — E039 Hypothyroidism, unspecified: Secondary | ICD-10-CM

## 2021-05-13 DIAGNOSIS — K55059 Acute (reversible) ischemia of intestine, part and extent unspecified: Secondary | ICD-10-CM | POA: Diagnosis not present

## 2021-05-13 DIAGNOSIS — C786 Secondary malignant neoplasm of retroperitoneum and peritoneum: Secondary | ICD-10-CM

## 2021-05-13 DIAGNOSIS — R350 Frequency of micturition: Secondary | ICD-10-CM

## 2021-05-13 DIAGNOSIS — Z79899 Other long term (current) drug therapy: Secondary | ICD-10-CM | POA: Insufficient documentation

## 2021-05-13 DIAGNOSIS — D500 Iron deficiency anemia secondary to blood loss (chronic): Secondary | ICD-10-CM

## 2021-05-13 DIAGNOSIS — R197 Diarrhea, unspecified: Secondary | ICD-10-CM | POA: Diagnosis not present

## 2021-05-13 DIAGNOSIS — R109 Unspecified abdominal pain: Secondary | ICD-10-CM | POA: Diagnosis not present

## 2021-05-13 DIAGNOSIS — C181 Malignant neoplasm of appendix: Secondary | ICD-10-CM | POA: Diagnosis not present

## 2021-05-13 DIAGNOSIS — M549 Dorsalgia, unspecified: Secondary | ICD-10-CM | POA: Insufficient documentation

## 2021-05-13 LAB — CMP (CANCER CENTER ONLY)
ALT: 14 U/L (ref 0–44)
AST: 21 U/L (ref 15–41)
Albumin: 3.7 g/dL (ref 3.5–5.0)
Alkaline Phosphatase: 150 U/L — ABNORMAL HIGH (ref 38–126)
Anion gap: 8 (ref 5–15)
BUN: 16 mg/dL (ref 6–20)
CO2: 28 mmol/L (ref 22–32)
Calcium: 9.7 mg/dL (ref 8.9–10.3)
Chloride: 107 mmol/L (ref 98–111)
Creatinine: 0.8 mg/dL (ref 0.44–1.00)
GFR, Estimated: >60 mL/min (ref >60)
Glucose, Bld: 139 mg/dL — ABNORMAL HIGH (ref 70–99)
Potassium: 3.4 mmol/L — ABNORMAL LOW (ref 3.5–5.1)
Sodium: 143 mmol/L (ref 135–145)
Total Bilirubin: 0.5 mg/dL (ref 0.3–1.2)
Total Protein: 7.2 g/dL (ref 6.5–8.1)

## 2021-05-13 LAB — CBC WITH DIFFERENTIAL (CANCER CENTER ONLY)
Abs Immature Granulocytes: 0.04 10*3/uL (ref 0.00–0.07)
Basophils Absolute: 0.1 10*3/uL (ref 0.0–0.1)
Basophils Relative: 1 %
Eosinophils Absolute: 0.3 10*3/uL (ref 0.0–0.5)
Eosinophils Relative: 3 %
HCT: 34.3 % — ABNORMAL LOW (ref 36.0–46.0)
Hemoglobin: 11.3 g/dL — ABNORMAL LOW (ref 12.0–15.0)
Immature Granulocytes: 0 %
Lymphocytes Relative: 38 %
Lymphs Abs: 3.5 10*3/uL (ref 0.7–4.0)
MCH: 31.8 pg (ref 26.0–34.0)
MCHC: 32.9 g/dL (ref 30.0–36.0)
MCV: 96.6 fL (ref 80.0–100.0)
Monocytes Absolute: 0.8 10*3/uL (ref 0.1–1.0)
Monocytes Relative: 9 %
Neutro Abs: 4.6 10*3/uL (ref 1.7–7.7)
Neutrophils Relative %: 49 %
Platelet Count: 342 10*3/uL (ref 150–400)
RBC: 3.55 MIL/uL — ABNORMAL LOW (ref 3.87–5.11)
RDW: 15.9 % — ABNORMAL HIGH (ref 11.5–15.5)
WBC Count: 9.3 10*3/uL (ref 4.0–10.5)
nRBC: 0 % (ref 0.0–0.2)

## 2021-05-13 LAB — TSH: TSH: 1.185 u[IU]/mL (ref 0.308–3.960)

## 2021-05-13 LAB — RETICULOCYTES
Immature Retic Fract: 14.4 % (ref 2.3–15.9)
RBC.: 3.58 MIL/uL — ABNORMAL LOW (ref 3.87–5.11)
Retic Count, Absolute: 108.8 10*3/uL (ref 19.0–186.0)
Retic Ct Pct: 3 % (ref 0.4–3.1)

## 2021-05-13 LAB — VITAMIN B12: Vitamin B-12: 122 pg/mL — ABNORMAL LOW (ref 180–914)

## 2021-05-13 NOTE — Progress Notes (Signed)
Hematology and Oncology Follow Up Visit  Wendy Barber [id number] 10-May-1961 60 y.o. 05/13/2021   Principle Diagnosis:  History of metastatic appendiceal cancer  -- recurrent  Superior mesenteric vein thrombus Tthromboembolism of the RIGHT leg  Current Therapy:   HIPEC - Surgery done in [place] in 11/2020 Xarelto 20 mg p.o. daily     Interim History:  Wendy Barber is back for follow-up.  Her main complaint is been some hair loss.  She feels very tired.  We are checking a thyroid level on her.  I am checking vitamin B-12 on her also.  She is had no obvious bleeding.  She is going to the bathroom.  She did have problems with significant C. difficile.  She is having some loose bowels.  She is complaining of some dysuria.  We are checking a for urinary tract infection.  She apparently would go back up to [facility] in I think August to see Dr. Sharon Mt.  She says that he we will do a CT scan up there.  She is doing well with pain.  I would not think she is taking much in the way of pain medication right now.  She has had no rashes..  There is been no issues with nausea or vomiting.  She has had no fever.  She has avoided the coronavirus.  There is no tingling in her hands or feet.  Again, the hair loss is what is troubling her.  Overall, her performance status is ECOG 2.    Medications:  Current Outpatient Medications:    busPIRone (BUSPAR) 5 MG tablet, Take 5 mg by mouth See admin instructions. Takes 1 tablet in the morning, 1 tablet in the afternoon and 2 tablets at night, Disp: , Rfl:    diphenoxylate-atropine (LOMOTIL) 2.5-0.025 MG tablet, Take 2 tablets by mouth in the morning, at noon, in the evening, and at bedtime., Disp: , Rfl:    famotidine (PEPCID) 20 MG tablet, Take 2 tablets (40 mg total) by mouth 2 (two) times daily., Disp: 360 tablet, Rfl: 3   FLUoxetine (PROZAC) 10 MG capsule, Take 1 capsule (10 mg total) by mouth daily., Disp: 90 capsule, Rfl: 1   HYDROmorphone  (DILAUDID) 4 MG tablet, Take 4 mg by mouth every 6 (six) hours as needed for severe pain., Disp: , Rfl:    loperamide (IMODIUM) 2 MG capsule, Take 4 mg by mouth as needed for diarrhea or loose stools., Disp: , Rfl:    Melatonin 1 MG CHEW, Chew 5 mg by mouth., Disp: , Rfl:    ondansetron (ZOFRAN) 8 MG tablet, Take 8 mg by mouth every 8 (eight) hours as needed., Disp: , Rfl:    pantoprazole (PROTONIX) 40 MG tablet, Take 40 mg by mouth every morning., Disp: , Rfl:    promethazine (PHENERGAN) 12.5 MG tablet, Take 12.5 mg by mouth 2 (two) times daily as needed., Disp: , Rfl:    propranolol (INDERAL) 20 MG tablet, Take 1 tablet (20 mg total) by mouth 2 (two) times daily., Disp: 60 tablet, Rfl: 0   rivaroxaban (XARELTO) 20 MG TABS tablet, Take 1 tablet (20 mg total) by mouth daily with supper., Disp: 30 tablet, Rfl: 4  Allergies:  Allergies  Allergen Reactions   Penicillins Shortness Of Breath    Other reaction(s): Irregular Heart Rate, Other (See Comments) Rapid heartrate    Alprazolam Hives and Other (See Comments)    Note: tolerates midazolam fine unresponsive Hard to arouse unresponsive Hard to arouse  Other reaction(s): Other (  See Comments) unresponsive   Ativan [Lorazepam] Other (See Comments)    Note: tolerates midazolam fine Face & Throat Swelling.   Corticosteroids Other (See Comments)    Other reaction(s): Other (see comments) Psychotic behaviour     Erythromycin     Other reaction(s): Other (See Comments) Severe stomach pain    Erythromycin Base Hives   Milnacipran Hcl Other (See Comments)    Other reaction(s):  psychotic episode      Prednisone Other (See Comments)    Anxiety & Nervous Breakdown.   Savella  [Milnacipran]    Povidone Iodine Rash    Had rash under bandage after surgery , has had betadine since without reaction    Prednisolone Anxiety    Past Medical History, Surgical history, Social history, and Family History were reviewed and  updated.  Review of Systems: Review of Systems  Constitutional:  Positive for fatigue and unexpected weight change.  HENT:  Negative.    Eyes: Negative.   Respiratory: Negative.    Cardiovascular: Negative.   Gastrointestinal:  Positive for abdominal pain and diarrhea.  Endocrine: Negative.   Genitourinary:  Positive for dysuria.   Musculoskeletal:  Positive for back pain.  Skin: Negative.   Neurological: Negative.   Hematological: Negative.   Psychiatric/Behavioral:  Positive for depression.    Physical Exam:  weight is 212 lb (96.2 kg). Her oral temperature is 98.1 F (36.7 C). Her blood pressure is 174/83 (abnormal) and her pulse is 59 (abnormal). Her respiration is 16 and oxygen saturation is 98%.   Wt Readings from Last 3 Encounters:  05/13/21 212 lb (96.2 kg)  03/15/21 217 lb 12.8 oz (98.8 kg)  02/05/21 229 lb 15 oz (104.3 kg)    Physical Exam Vitals reviewed.  HENT:     Head: Normocephalic and atraumatic.  Eyes:     Pupils: Pupils are equal, round, and reactive to light.  Cardiovascular:     Rate and Rhythm: Normal rate and regular rhythm.     Heart sounds: Normal heart sounds.  Pulmonary:     Effort: Pulmonary effort is normal.     Breath sounds: Normal breath sounds.  Abdominal:     General: Bowel sounds are normal.     Palpations: Abdomen is soft.     Comments: Abdominal exam shows healed laparotomy scar.  She has no fluid wave.  There is no guarding or rebound tenderness.  She has decent bowel sounds.  There is no palpable liver or spleen tip.  Musculoskeletal:        General: No tenderness or deformity. Normal range of motion.     Cervical back: Normal range of motion.  Lymphadenopathy:     Cervical: No cervical adenopathy.  Skin:    General: Skin is warm and dry.     Findings: No erythema or rash.  Neurological:     Mental Status: She is alert and oriented to person, place, and time.  Psychiatric:        Behavior: Behavior normal.        Thought  Content: Thought content normal.        Judgment: Judgment normal.     Lab Results  Component Value Date   WBC 9.3 05/13/2021   HGB 11.3 (L) 05/13/2021   HCT 34.3 (L) 05/13/2021   MCV 96.6 05/13/2021   PLT 342 05/13/2021     Chemistry      Component Value Date/Time   NA 140 03/15/2021 1402   K 3.4 (L) 03/15/2021  1402   CL 103 03/15/2021 1402   CO2 31 03/15/2021 1402   BUN 14 03/15/2021 1402   CREATININE 0.63 03/15/2021 1402   CREATININE 0.72 10/28/2020 0956      Component Value Date/Time   CALCIUM 9.8 03/15/2021 1402   ALKPHOS 193 (H) 03/15/2021 1402   AST 23 03/15/2021 1402   AST 35 10/28/2020 0956   ALT 16 03/15/2021 1402   ALT 28 10/28/2020 0956   BILITOT 0.5 03/15/2021 1402   BILITOT 0.5 10/28/2020 0956      Impression and Plan: Ms. Nodal is a very charming 60 year-old white female.  She has incredible history.  She now has recurrence of her appendiceal cancer.  I am just absolutely amazed by this.  It has been about 15 years or so since she had her initial appendiceal cancer removed.  Again, she has a lot of issues ongoing.  I am not sure was going on with the hair loss.  I know that Xarelto can sometimes cause hair loss.  We did get lab work back on her.  Her vitamin B12 level is quite low at 122.  She does have iron deficiency with iron saturation of only 13%.  I think the combination of these 2 is certainly contribute to the hair loss.  It could also contribute to her being tired.  We will try to replace her vitamin B12 and also give her back some IV iron.  Hopefully this will give her some stamina so she will be able to enjoy her life.  Will be interesting to see what happens when she goes back up to [place] and has a CT scan done.  I would have to think that she is going to be at risk for recurrence.  I just do not know she is really capable of tolerating any type of adjuvant therapy.  We does want to try to help her quality of life.  Hopefully, by  replacing her B12 and iron, she will feel better.  Thyroid is doing okay.  We actually did find that she had a urinary tract infection.  I think this might be E. coli.  Allowed to see what the sensitivities are before we give her antibiotics.  I would like to see her back after she is back from [place].  Again it sounds like she is going up there in August.  Again I just want to try to help her quality of life.    Volanda Napoleon, MD 7/21/20229:12 AM

## 2021-05-13 NOTE — Telephone Encounter (Signed)
Appts made per 05/13/21 los, pt states she will be out of town 9/15-10/3 and req to make her f/u appt after that.  She also states that she will be seeing her doctor in [place] on 9/19.

## 2021-05-14 ENCOUNTER — Encounter: Payer: Self-pay | Admitting: Hematology & Oncology

## 2021-05-14 ENCOUNTER — Other Ambulatory Visit: Payer: Self-pay | Admitting: *Deleted

## 2021-05-14 DIAGNOSIS — D510 Vitamin B12 deficiency anemia due to intrinsic factor deficiency: Secondary | ICD-10-CM

## 2021-05-14 DIAGNOSIS — D500 Iron deficiency anemia secondary to blood loss (chronic): Secondary | ICD-10-CM

## 2021-05-14 HISTORY — DX: Iron deficiency anemia secondary to blood loss (chronic): D50.0

## 2021-05-14 HISTORY — DX: Vitamin B12 deficiency anemia due to intrinsic factor deficiency: D51.0

## 2021-05-14 LAB — FERRITIN: Ferritin: 23 ng/mL (ref 11–307)

## 2021-05-14 LAB — IRON AND TIBC
Iron: 53 ug/dL (ref 28–170)
Saturation Ratios: 13 % (ref 10.4–31.8)
TIBC: 400 ug/dL (ref 250–450)
UIBC: 347 ug/dL

## 2021-05-14 LAB — CHROMOGRANIN A: Chromogranin A (ng/mL): 330.4 ng/mL — ABNORMAL HIGH (ref 0.0–101.8)

## 2021-05-14 MED ORDER — CIPROFLOXACIN HCL 500 MG PO TABS: 500.0000 mg | ORAL_TABLET | Freq: Two times a day (BID) | ORAL | 0 refills | Status: DC

## 2021-05-15 LAB — URINE CULTURE: Culture: >=100000 — AB

## 2021-05-17 ENCOUNTER — Other Ambulatory Visit: Payer: Self-pay

## 2021-05-17 ENCOUNTER — Telehealth: Payer: Self-pay | Admitting: *Deleted

## 2021-05-17 ENCOUNTER — Encounter: Payer: Self-pay | Admitting: *Deleted

## 2021-05-17 ENCOUNTER — Telehealth: Payer: Self-pay

## 2021-05-17 ENCOUNTER — Other Ambulatory Visit: Payer: Self-pay | Admitting: *Deleted

## 2021-05-17 ENCOUNTER — Ambulatory Visit (HOSPITAL_BASED_OUTPATIENT_CLINIC_OR_DEPARTMENT_OTHER)
Admission: RE | Admit: 2021-05-17 | Discharge: 2021-05-17 | Disposition: A | Discharge status: Home / Self Care | Source: Ambulatory Visit | Attending: Hematology & Oncology | Admitting: Hematology & Oncology

## 2021-05-17 ENCOUNTER — Other Ambulatory Visit: Payer: Self-pay | Admitting: Hematology & Oncology

## 2021-05-17 ENCOUNTER — Other Ambulatory Visit: Payer: Self-pay | Admitting: Family

## 2021-05-17 DIAGNOSIS — I82412 Acute embolism and thrombosis of left femoral vein: Secondary | ICD-10-CM | POA: Diagnosis not present

## 2021-05-17 MED ORDER — CIPROFLOXACIN HCL 500 MG PO TABS: 500.0000 mg | ORAL_TABLET | Freq: Two times a day (BID) | ORAL | 0 refills | Status: DC

## 2021-05-17 MED ORDER — "EASY TOUCH FLIPLOCK NEEDLES 25G X 1"" MISC": 1.0000 | Freq: Every day | 2 refills | Status: DC

## 2021-05-17 MED ORDER — CYANOCOBALAMIN 1000 MCG/ML IJ SOLN: INTRAMUSCULAR | 2 refills | Status: DC

## 2021-05-17 NOTE — Telephone Encounter (Signed)
Pt was left a vm on 05/14/21 at 3:00 to call for iron appt, pt has not ret call to sch at this point

## 2021-05-17 NOTE — Progress Notes (Unsigned)
pro

## 2021-05-17 NOTE — Telephone Encounter (Signed)
On Friday 05/14/2021 attempted to call patient to give her Dr [name] message about UTI and antibiotics as well as iron and B12 injections.  Phone goes straight to voicemail.  Called this morning to follow up.  Phone went directly to voicemail again.  Called patients husband to check up on her and ask her to call me.  Husband said he would give patient the message to call me.

## 2021-05-17 NOTE — Telephone Encounter (Addendum)
-----   Message from Volanda Napoleon, MD sent at 05/13/2021  2:57 PM EDT ----- Called patient to let her know the B12 level is very low. This might be the hair loss reason.  She needs Vit b12 injections.  Patient prefers to get these injections at home as her daughter is a Marine scientist and lives close by.  Also let her know that The thyroid level is ok!! Informed patient that there is no actual blood clot in the leg.  There is some inflammation of one of the superficial veins.  We have to keep her on the anticoagulation.  Let patient know that the iron level is actually low.  Told patient we would set her up for iron infusions.  Message sent to schedulers.          [name]

## 2021-05-20 ENCOUNTER — Inpatient Hospital Stay

## 2021-05-20 ENCOUNTER — Other Ambulatory Visit: Payer: Self-pay

## 2021-05-20 VITALS — BP 148/85 | HR 68 | Temp 98.7°F | Resp 18

## 2021-05-20 DIAGNOSIS — C181 Malignant neoplasm of appendix: Secondary | ICD-10-CM | POA: Diagnosis not present

## 2021-05-20 DIAGNOSIS — D510 Vitamin B12 deficiency anemia due to intrinsic factor deficiency: Secondary | ICD-10-CM

## 2021-05-20 MED ORDER — SODIUM CHLORIDE 0.9 % IV SOLN
750.0000 mg | Freq: Once | INTRAVENOUS | Status: AC
  Administered 2021-05-20: 750 mg via INTRAVENOUS
  Filled 2021-05-20: qty 15

## 2021-05-20 MED ORDER — SODIUM CHLORIDE 0.9 % IV SOLN
Freq: Once | INTRAVENOUS | Status: AC
  Filled 2021-05-20: qty 250

## 2021-05-20 NOTE — Patient Instructions (Signed)
Ferric carboxymaltose injection What is this medication? FERRIC CARBOXYMALTOSE (FER ik car BOX ee MAWL tose) treats anemia in people with chronic kidney disease or people who cannot take iron by mouth. It works by increasing iron in your body which helps red blood cells deliver oxygen fromthe lungs to cells all over the body. This medicine may be used for other purposes; ask your health care provider orpharmacist if you have questions. COMMON BRAND NAME(S): Injectafer What should I tell my care team before I take this medication? They need to know if you have any of these conditions: High blood pressure High levels of iron in the blood An unusual or allergic reaction to iron, other medications, foods, dyes, or preservatives Pregnant or trying to get pregnant Breast-feeding How should I use this medication? This medication is injected into a vein. It is given by your care team in ahospital or clinic setting. Talk to your care team about the use of this medication in children. While it may be given to children as young as 1 year for selected conditions,precautions do apply. Overdosage: If you think you have taken too much of this medicine contact apoison control center or emergency room at once. NOTE: This medicine is only for you. Do not share this medicine with others. What if I miss a dose? Keep appointments for follow-up doses. It is important not to miss your dose.Call your care team if you are unable to keep an appointment. What may interact with this medication? Do not take this medication with any of the following medications: Deferoxamine Dimercaprol Other iron products This list may not describe all possible interactions. Give your health care provider a list of all the medicines, herbs, non-prescription drugs, or dietary supplements you use. Also tell them if you smoke, drink alcohol, or use illegaldrugs. Some items may interact with your medicine. What should I watch for while  using this medication? Visit your care team for regular checks on your progress. Tell your care teamif your symptoms do not start to get better or if they get worse. You may need blood work while you are taking this medication. You may need to eat more foods that contain iron. Talk to your care team. Foods that contain iron include whole grains/cereals, dried fruits, beans, or peas,leafy green vegetables, and organ meats (liver, kidney). What side effects may I notice from receiving this medication? Side effects that you should report to your care team as soon as possible: Allergic reactions-skin rash, itching, hives, swelling of the face, lips, tongue, or throat Dizziness Facial flushing, redness Increase in blood pressure Low phosphorous levels-loss of appetite, muscle weakness Side effects that usually do not require medical attention (report to your careteam if they continue or are bothersome): Change in taste Constipation Headache Nausea Pain, redness, or irritation at injection site Vomiting This list may not describe all possible side effects. Call your doctor for medical advice about side effects. You may report side effects to FDA at1-800-FDA-1088. Where should I keep my medication? This medication is given in a hospital or clinic. It will not be stored at home. NOTE: This sheet is a summary. It may not cover all possible information. If you have questions about this medicine, talk to your doctor, pharmacist, orhealth care provider.  2022 Elsevier/Gold Standard (2020-11-06 12:26:19)  

## 2021-05-26 ENCOUNTER — Ambulatory Visit (INDEPENDENT_AMBULATORY_CARE_PROVIDER_SITE_OTHER): Admitting: Family Medicine

## 2021-05-26 ENCOUNTER — Other Ambulatory Visit: Payer: Self-pay

## 2021-05-26 ENCOUNTER — Encounter: Payer: Self-pay | Admitting: Family Medicine

## 2021-05-26 VITALS — BP 140/78 | HR 59 | Temp 98.0°F | Resp 17 | Ht 66.0 in | Wt 213.2 lb

## 2021-05-26 DIAGNOSIS — F418 Other specified anxiety disorders: Secondary | ICD-10-CM

## 2021-05-26 MED ORDER — CITALOPRAM HYDROBROMIDE 10 MG PO TABS: 10.0000 mg | ORAL_TABLET | Freq: Every day | ORAL | 3 refills | Status: DC

## 2021-05-26 NOTE — Progress Notes (Signed)
Subjective:  Patient ID: Wendy Barber, female    DOB: May 02, 1961  Age: 60 y.o. MRN: [id number]  CC:  Chief Complaint  Patient presents with   Depression    Pt reports tried new medication and started having night terrors pt reports she has since stopped the medication and is apx the same as last OV     HPI Wendy Barber presents for   Depression: With anxiety.  Discussed at her July 13 visit when establishing care.  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  Previously had been treated with BuSpar, unknown prior SSRI.  More anxiety prior to surgery then depression with anxiety since repeat surgery.  Previously had therapist but had not met with anybody recently.  Did request different therapist.  She did have fleeting thoughts of wishing to die due to sickness and surgery but denied specific suicidal plan or intent.  I did provide some numbers for counseling, and started on fluoxetine 10 mg daily, continued on same dose buspirone.  With option of decreasing to twice per day buspirone.  Unfortunately she did not tolerate Prozac.  Experienced night terrors. Took for 4-5 days and noticed terrors most nights. Took for total of 10 days and night terrors most nights. Hx of night terrors in past - possibly fluoxetine then.   Note reviewed from Dr.Tananis on March 23.  Indicated she was taking Lyrica and Cymbalta at that time but not helping pain much. She took herself off Cymbalta months ago. Did not like the way it made her feel, but unknown specific issue.  Had been on cymbalta 62m for years.  Sweating and weight gain from Lyrica. Lethargic. Takes oxycodone as needed only - not daily - 3-4 days per week.  Does not want to try cymbalta. Husband will be home fulltime at end of month. Hard when he was away with night terrors.   Note reviewed on July 21 from oncology, her B12 was low at 122, and iron sat  of 13%.  Plan for repletion of vitamin B12 as well as some IV iron - infusion last week, and B12 past 5 days.   Still with suicidal thoughts, not worse, and maybe a little better as having some better days. No specific plan, no intent.  [facility]    [facility] 05/26/2021 05/05/2021 02/19/2021  Decreased Interest 3 2 3   Down, Depressed, Hopeless 3 3 3   PHQ - 2 Score 6 5 6   Altered sleeping 3 3 3   Tired, decreased energy 3 2 3   Change in appetite 3 3 3   Feeling bad or failure about yourself  2 3 3   Trouble concentrating 3 3 3   Moving slowly or fidgety/restless 2 0 3  Suicidal thoughts 3 1 1   PHQ-9 Score 25 20 25   Difficult doing work/chores Very difficult - Extremely dIfficult     History Patient Active Problem List   Diagnosis Date Noted   Pernicious anemia 05/14/2021   Iron deficiency anemia due to chronic blood loss 05/14/2021   Abdominal wall abscess    Hypokalemia    Surgical wound infection 01/16/2021   C. 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[place] 03/19/2020   Goals of care, counseling/discussion 03/19/2020   Malignant pseudomyxoma peritonei ([facility] 03/19/2020   DVT of deep femoral vein, left ([facility] 03/19/2020   Pulmonary embolism, bilateral ([place] 03/19/2020   Presence of IVC  filter 03/19/2020   Hepatic encephalopathy ([place]) 11/22/2019   Confusion 11/21/2019   Autoimmune hepatitis ([place]) 11/21/2019   HTN (hypertension) 11/21/2019   DM2 (diabetes mellitus, type 2) ([place]) 11/21/2019   AMS (altered mental status) 11/21/2019   Closed right ankle fracture 08/27/2019   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[place]) 03/19/2020   Chronic fatigue syndrome    Diabetes mellitus without complication (HCC)    DVT of deep femoral vein, left ([place]) 03/19/2020    Fibromyalgia    Goals of care, counseling/discussion 03/19/2020   Hypertension    Iron deficiency anemia due to chronic blood loss 05/14/2021   Malignant pseudomyxoma peritonei ([place]) 03/19/2020   Pernicious anemia 05/14/2021   Presence of IVC filter 03/19/2020   Pulmonary embolism, bilateral ([place]) 03/19/2020   Past Surgical History:  Procedure Laterality Date   ABDOMINAL HYSTERECTOMY     ABDOMINAL SURGERY     ACHILLES TENDON REPAIR     APPENDECTOMY     ARTHROPLASTY     CARPAL TUNNEL RELEASE     CHOLECYSTECTOMY     IR CATHETER TUBE CHANGE  02/02/2021   IR CATHETER TUBE CHANGE  02/25/2021   IR RADIOLOGIST EVAL & MGMT  02/24/2021   IR RADIOLOGIST EVAL & MGMT  03/10/2021   IR US GUIDE BX ASP/DRAIN  11/25/2019   JOINT REPLACEMENT     KNEE ARTHROSCOPY     ORIF ANKLE FRACTURE Right 08/27/2019   Procedure: OPEN REDUCTION INTERNAL FIXATION RIGHT ANKLE FRACTURE;  Surgeon: Renette Butters, MD;  Location: [facility];  Service: Orthopedics;  Laterality: Right;   perineorrophy     TONGUE BIOPSY     Allergies  Allergen Reactions   Penicillins Shortness Of Breath    Other reaction(s): Irregular Heart Rate, Other (See Comments) Rapid heartrate    Alprazolam Hives and Other (See Comments)    Note: tolerates midazolam fine unresponsive Hard to arouse unresponsive Hard to arouse  Other reaction(s): Other (See Comments) unresponsive   Ativan [Lorazepam] Other (See Comments)    Note: tolerates midazolam fine Face & Throat Swelling.   Corticosteroids Other (See Comments)    Other reaction(s): Other (see comments) Psychotic behaviour     Erythromycin     Other reaction(s): Other (See Comments) Severe stomach pain    Erythromycin Base Hives   Milnacipran Hcl Other (See Comments)    Other reaction(s):  psychotic episode      Prednisone Other (See Comments)    Anxiety & Nervous Breakdown.   Savella  [Milnacipran]    Povidone Iodine Rash    Had rash under bandage after surgery , has had  betadine since without reaction    Prednisolone Anxiety   Prior to Admission medications   Medication Sig Start Date End Date Taking? Authorizing Provider  busPIRone (BUSPAR) 5 MG tablet Take 5 mg by mouth See admin instructions. Takes 1 tablet in the morning, 1 tablet in the afternoon and 2 tablets at night   Yes [provider]  cyanocobalamin (,VITAMIN B-12,) 1000 MCG/ML injection Take 1000 mcg IM every day x 5 days, then once weekly x 4 weeks, then once a month thereafter. 05/17/21  Yes Volanda Napoleon, MD  diphenoxylate-atropine (LOMOTIL) 2.5-0.025 MG tablet Take 2 tablets by mouth in the morning, at noon, in the evening, and at bedtime. 04/01/21  Yes [provider]  famotidine (PEPCID) 20 MG tablet Take 2 tablets (40 mg total) by mouth 2 (two) times daily. 03/24/21  Yes Volanda Napoleon, MD  HYDROmorphone (DILAUDID) 4 MG tablet Take 4 mg by mouth every 6 (six) hours as needed for severe pain.   Yes [provider]  loperamide (IMODIUM) 2 MG capsule Take 4 mg by mouth as needed for diarrhea or loose stools.   Yes [provider]  Melatonin 1 MG CHEW Chew 5 mg by mouth.   Yes [provider]  NEEDLE, DISP, 25 G (EASY TOUCH FLIPLOCK NEEDLES) 25G X 1" MISC Inject 1 each into the muscle daily. 05/17/21  Yes Volanda Napoleon, MD  ondansetron (ZOFRAN) 8 MG tablet Take 8 mg by mouth every 8 (eight) hours as needed. 03/03/21  Yes [provider]  pantoprazole (PROTONIX) 40 MG tablet Take 40 mg by mouth every morning. 01/01/21  Yes [provider]  promethazine (PHENERGAN) 12.5 MG tablet Take 12.5 mg by mouth 2 (two) times daily as needed. 03/04/21  Yes [provider]  rivaroxaban (XARELTO) 20 MG TABS tablet Take 1 tablet (20 mg total) by mouth daily with supper. 03/09/21  Yes Ennever, Rudell Cobb, MD  ciprofloxacin (CIPRO) 500 MG tablet Take 1 tablet (500 mg total) by mouth 2 (two) times daily. 05/14/21   Volanda Napoleon, MD  ciprofloxacin  (CIPRO) 500 MG tablet Take 1 tablet (500 mg total) by mouth 2 (two) times daily. 05/17/21   Volanda Napoleon, MD  FLUoxetine (PROZAC) 10 MG capsule Take 1 capsule (10 mg total) by mouth daily. Patient not taking: Reported on 05/26/2021 05/05/21   [name], MD  propranolol (INDERAL) 20 MG tablet Take 1 tablet (20 mg total) by mouth 2 (two) times daily. 02/10/21 03/12/21  Harold Hedge, MD   Social History   Socioeconomic History   Marital status: Married    Spouse name: Not on file   Number of children: 3   Years of education: Not on file   Highest education level: Some college, no degree  Occupational History   Not on file  Tobacco Use   Smoking status: Never   Smokeless tobacco: Never  Vaping Use   Vaping Use: Never used  Substance and Sexual Activity   Alcohol use: No   Drug use: Never   Sexual activity: Yes    Birth control/protection: None    Comment: hysterectomy  Other Topics Concern   Not on file  Social History Narrative   Right handed   One story home   Drinks occasional caffeine   Social Determinants of Health   Financial Resource Strain: Not on file  Food Insecurity: Not on file  Transportation Needs: Not on file  Physical Activity: Not on file  Stress: Not on file  Social Connections: Not on file  Intimate Partner Violence: Not on file    Review of Systems Per HPI.   Objective:   Vitals:   05/26/21 1543  BP: 140/78  Pulse: (!) 59  Resp: 17  Temp: 98 F (36.7 C)  TempSrc: Temporal  SpO2: 95%  Weight: 213 lb 3.2 oz (96.7 kg)  Height: 5' 6"  (1.676 m)     Physical Exam Vitals reviewed.  Constitutional:      General: She is not in acute distress.    Appearance: Normal appearance. She is well-developed.  HENT:     Head: Normocephalic and atraumatic.  Cardiovascular:     Rate and Rhythm:  Normal rate.  Pulmonary:     Effort: Pulmonary effort is normal.  Neurological:     Mental Status: She is alert and oriented to person, place, and  time.  Psychiatric:        Mood and Affect: Mood normal.        Behavior: Behavior normal.        Thought Content: Thought content normal. Thought content does not include suicidal (fleeting thoughts. denies intent/plan.) ideation.       Assessment & Plan:  Wendy Barber is a 60 y.o. female . Depression with anxiety - Plan: Ambulatory referral to Psychiatry, citalopram (CELEXA) 10 MG tablet  -Fortunately intolerant to fluoxetine.  Does not want to try Cymbalta due to previous intolerance/side effects.  We will try different SSRI with Celexa low-dose.  Potential side effects discussed.  plan on follow-up with therapist, can also reach out to her oncologist office as may have other options for therapist. Recheck next month, ER/988/911 precautions given and handout given if worsening suicidal ideation.  Meds ordered this encounter  Medications   citalopram (CELEXA) 10 MG tablet    Sig: Take 1 tablet (10 mg total) by mouth daily.    Dispense:  30 tablet    Refill:  3   Patient Instructions  I do recommend counsleing, so hopefully [facility]. You can always contact Dr. Antonieta Pert office to see if they have someone for you to see if needed.  Start celexa once per day - low dose. If this is tolerated, then can increase if needed in next month. Recheck in 1 month  - virtual is ok.   Return to the clinic or go to the nearest emergency room if any of your symptoms worsen or new symptoms occur.  Support in a Crisis  What if I or someone I know is in crisis?      If you are thinking about harming yourself or having thoughts of suicide, or if you know someone who is, seek help right away.      Call your doctor or mental health care provider.      Call 988, 911 or go to a hospital emergency room to get immediate help, or ask a friend or family member to help you do these things.      Call the [place] National Suicide Prevention Lifeline's toll-free, 24-hour  hotline at 1-800-273-TALK 321-285-8645) or TTY: 1-800-799-4 TTY 818-335-8412) to talk to a trained counselor.      If you are in crisis, make sure you are not left alone.       If someone else is in crisis, make sure he or she is not left alone   24 Hour Availability  [facility]  [address], [place], [place] [address]  [phone] or [phone]  [facility] [employer] (Domestic Violence, Rape & Victim Assistance [phone]  [place]  [address], [place]  [address]               [phone] or [phone]  [place]    (ONLY from 8am-4pm)    [phone]  [facility] (24/7)   [phone]  [place] National Suicide Hotline   (817)114-6005 Diamantina Monks)  Support from local police to aid getting patient to hospital (http://www.[place].gov/index.aspx?page=2797)    Signed,   Merri Ray, MD [place] 05/26/21 7:38 PM

## 2021-05-26 NOTE — Patient Instructions (Signed)
I do recommend counsleing, so hopefully [facility] will contact you soon. You can always contact Dr. Antonieta Pert office to see if they have someone for you to see if needed.  Start celexa once per day - low dose. If this is tolerated, then can increase if needed in next month. Recheck in 1 month  - virtual is ok.   Return to the clinic or go to the nearest emergency room if any of your symptoms worsen or new symptoms occur.  Support in a Crisis  What if I or someone I know is in crisis?      If you are thinking about harming yourself or having thoughts of suicide, or if you know someone who is, seek help right away.      Call your doctor or mental health care provider.      Call 988, 911 or go to a hospital emergency room to get immediate help, or ask a friend or family member to help you do these things.      Call the [place] National Suicide Prevention Lifeline's toll-free, 24-hour hotline at 1-800-273-TALK 352-082-8813) or TTY: 1-800-799-4 TTY (571)517-4979) to talk to a trained counselor.      If you are in crisis, make sure you are not left alone.       If someone else is in crisis, make sure he or she is not left alone   24 Hour Availability  [facility]  [address], [place], [place] [address]  [phone] or [phone]  [facility] [employer] (Domestic Violence, Rape & Victim Assistance ([phone]  [place]  [address], [place]  [address]               [phone] or [phone]  [place]    (ONLY from 8am-4pm)    [phone]  [facility] (24/7)   [phone]  [place] National Suicide Hotline   615-129-0001 Diamantina Monks)  Support from local police to aid getting patient to hospital (http://www.[place].gov/index.aspx?page=2797)

## 2021-06-01 ENCOUNTER — Telehealth: Payer: Self-pay

## 2021-06-01 ENCOUNTER — Other Ambulatory Visit: Payer: Self-pay

## 2021-06-01 ENCOUNTER — Other Ambulatory Visit: Payer: Self-pay | Admitting: Hematology & Oncology

## 2021-06-01 ENCOUNTER — Ambulatory Visit (HOSPITAL_BASED_OUTPATIENT_CLINIC_OR_DEPARTMENT_OTHER)
Admission: RE | Admit: 2021-06-01 | Discharge: 2021-06-01 | Disposition: A | Discharge status: Home / Self Care | Source: Ambulatory Visit | Attending: Hematology & Oncology | Admitting: Hematology & Oncology

## 2021-06-01 DIAGNOSIS — C181 Malignant neoplasm of appendix: Secondary | ICD-10-CM

## 2021-06-01 DIAGNOSIS — C772 Secondary and unspecified malignant neoplasm of intra-abdominal lymph nodes: Secondary | ICD-10-CM

## 2021-06-01 NOTE — Telephone Encounter (Signed)
Patient called stating she had diarrhea all last week (which is not unsual) but now has not had a BM in the last 3 days and is very nauseated and has a severe headache, belching and fatigued. States she was going to go to the emergency room but wanted to stay out of the hospital if possible. Discussed with Dr.Ennever, order placed for abd xray, patient to come today to complete. Called patient and she will be on her way to come do the xray and she understands we will call her once results come in to discuss further.

## 2021-06-03 ENCOUNTER — Other Ambulatory Visit: Payer: Self-pay | Admitting: Hematology & Oncology

## 2021-06-03 ENCOUNTER — Other Ambulatory Visit: Payer: Self-pay | Admitting: *Deleted

## 2021-06-03 DIAGNOSIS — C181 Malignant neoplasm of appendix: Secondary | ICD-10-CM

## 2021-06-03 DIAGNOSIS — C772 Secondary and unspecified malignant neoplasm of intra-abdominal lymph nodes: Secondary | ICD-10-CM

## 2021-06-03 MED ORDER — RIVAROXABAN 20 MG PO TABS: 20.0000 mg | ORAL_TABLET | Freq: Every day | ORAL | 4 refills | Status: DC

## 2021-06-04 ENCOUNTER — Telehealth: Payer: Self-pay | Admitting: *Deleted

## 2021-06-04 NOTE — Telephone Encounter (Signed)
Called patient on 06/03/2021 to check on her status.  Had 2 large diarrhea stools which she feels she will probably just have to get used to after her surgery. Still feels fatigued and with some nausea but all in all a little better.  Told patient Dr [name] was going to put an order in for a CT scan to get a better look at what is going on. Patient appreciative of call.

## 2021-06-09 ENCOUNTER — Ambulatory Visit (HOSPITAL_BASED_OUTPATIENT_CLINIC_OR_DEPARTMENT_OTHER)

## 2021-06-09 ENCOUNTER — Encounter (HOSPITAL_BASED_OUTPATIENT_CLINIC_OR_DEPARTMENT_OTHER): Payer: Self-pay

## 2021-06-09 ENCOUNTER — Other Ambulatory Visit: Payer: Self-pay

## 2021-06-09 ENCOUNTER — Ambulatory Visit (HOSPITAL_BASED_OUTPATIENT_CLINIC_OR_DEPARTMENT_OTHER)
Admission: RE | Admit: 2021-06-09 | Discharge: 2021-06-09 | Disposition: A | Discharge status: Home / Self Care | Source: Ambulatory Visit | Attending: Hematology & Oncology | Admitting: Hematology & Oncology

## 2021-06-09 DIAGNOSIS — C181 Malignant neoplasm of appendix: Secondary | ICD-10-CM | POA: Insufficient documentation

## 2021-06-09 DIAGNOSIS — C772 Secondary and unspecified malignant neoplasm of intra-abdominal lymph nodes: Secondary | ICD-10-CM | POA: Diagnosis present

## 2021-06-09 MED ORDER — IOHEXOL 300 MG/ML  SOLN
100.0000 mL | Freq: Once | INTRAMUSCULAR | Status: AC | PRN
  Administered 2021-06-09: 100 mL via INTRAVENOUS

## 2021-06-10 ENCOUNTER — Telehealth: Payer: Self-pay | Admitting: *Deleted

## 2021-06-10 NOTE — Telephone Encounter (Signed)
Call placed to pt per order of Dr. [name] to inform her that CT scan is suspicious for partial small bowel obstruction and to check on pt.'s status.  Pt states that she started diarrhea over the weekend, has slight nausea which is relieved with Zofran and Phenergan and has "small twangs" of pain across her abdomen at times.  Dr. [name] notified.  Pt instructed per order of Dr. [name] to continue pushing fluids and to go to the ER at [facility] immediately if she begins vomiting or any new abdominal pain.  Teach back done.  Pt is appreciative of call and has no questions at this time.

## 2021-06-14 ENCOUNTER — Telehealth: Payer: Self-pay | Admitting: *Deleted

## 2021-06-14 ENCOUNTER — Encounter: Payer: Self-pay | Admitting: *Deleted

## 2021-06-14 NOTE — Telephone Encounter (Signed)
This nurse called patient and informed her that her letter for travel was ready for pickup. Patient will come to [facility] tomorrow and pick up the letter at the front desk. She verbalized understanding.

## 2021-06-17 ENCOUNTER — Encounter: Payer: Self-pay | Admitting: *Deleted

## 2021-07-01 ENCOUNTER — Telehealth (INDEPENDENT_AMBULATORY_CARE_PROVIDER_SITE_OTHER): Admitting: Family Medicine

## 2021-07-01 ENCOUNTER — Encounter: Payer: Self-pay | Admitting: Family Medicine

## 2021-07-01 ENCOUNTER — Other Ambulatory Visit: Payer: Self-pay

## 2021-07-01 DIAGNOSIS — F418 Other specified anxiety disorders: Secondary | ICD-10-CM

## 2021-07-01 LAB — HM DIABETES EYE EXAM

## 2021-07-01 MED ORDER — CITALOPRAM HYDROBROMIDE 20 MG PO TABS: 20.0000 mg | ORAL_TABLET | Freq: Every day | ORAL | 1 refills | Status: DC

## 2021-07-01 NOTE — Progress Notes (Signed)
Virtual Visit via Video Note  I connected with Wendy Barber on 07/01/21 at 5:11 PM by a video enabled telemedicine application and verified that I am speaking with the correct person using two identifiers.  Patient location: home - private area  My location: office - [place]   I discussed the limitations, risks, security and privacy concerns of performing an evaluation and management service by telephone and the availability of in person appointments. I also discussed with the patient that there may be a patient responsible charge related to this service. The patient expressed understanding and agreed to proceed, consent obtained  Chief complaint:  Depression: Follow-up from August 3.  Complicated past medical history.  Did not tolerate Prozac previously.  Intolerant to Lyrica, side effects to Cymbalta.  Ultimately decided to try Celexa low-dose initially at 10 mg daily with potential side effects discussed.  Also recommended meeting with therapist, and suicide hotline precautions given. No side effects with celexa - tolerating that ok- feels like it is helping a little bit. Less anxious, less depressed but has not been feeling well physically. Mood a little better. Husband has been able to move back home recently which helps. Called [facility] - waiting on call back for therapist.  No suicide thoughts since last visit.   Last 3 weeks has noted 2 partial intestine blockages. Has not felt well with these blockages - followed by specialists. Diarrhea past few weeks. Not eating much. Saw GI yesterday. Suspected short bowel syndrome. Discussed with infectious disease. Started on Xifaxan for 2 weeks initially.  Endocrine stopped diabetic meds.      Depression screen [facility] 2/9 07/01/2021 05/26/2021 05/05/2021 02/19/2021  Decreased Interest '3 3 2 3  '$ Down, Depressed, Hopeless '3 3 3 3  '$ PHQ - 2 Score '6 6 5 6  '$ Altered sleeping '3 3 3 3  '$ Tired, decreased energy '3 3 2 3  '$ Change in appetite '3 3 3 3   '$ Feeling bad or failure about yourself  '2 2 3 3  '$ Trouble concentrating '3 3 3 3  '$ Moving slowly or fidgety/restless 1 2 0 3  Suicidal thoughts '1 3 1 1  '$ PHQ-9 Score '22 25 20 25  '$ Difficult doing work/chores - Very difficult - Extremely dIfficult     History of Present Illness: Wendy Barber is a 60 y.o. female   Patient Active Problem List   Diagnosis Date Noted   Pernicious anemia 05/14/2021   Iron deficiency anemia due to chronic blood loss 05/14/2021   Abdominal wall abscess    Hypokalemia    Surgical wound infection 01/16/2021   C. 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/19/2020   Goals of care, counseling/discussion 03/19/2020   Malignant pseudomyxoma peritonei ([place]) 03/19/2020   DVT of deep femoral vein, left ([place]) 03/19/2020   Pulmonary embolism, bilateral ([place]) 03/19/2020   Presence of IVC filter 03/19/2020   Hepatic encephalopathy ([place]) 11/22/2019   Confusion 11/21/2019   Autoimmune hepatitis ([place]) 11/21/2019   HTN (hypertension) 11/21/2019   DM2 (diabetes mellitus, type 2) ([place]) 11/21/2019   AMS (altered mental status) 11/21/2019   Closed right ankle fracture 08/27/2019   Past Medical History:  Diagnosis Date   Arthritis    Back pain    Cancer ([place])    pseudomyxoma peritonei   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[place]) 03/19/2020   Chronic fatigue syndrome    Diabetes mellitus without complication (HCC)    DVT of deep femoral vein,  left ([place]) 03/19/2020   Fibromyalgia    Goals of care, counseling/discussion 03/19/2020   Hypertension    Iron deficiency anemia due to chronic blood loss 05/14/2021   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   Pernicious anemia 05/14/2021   Presence of IVC filter 03/19/2020   Pulmonary embolism, bilateral ([place]) 03/19/2020   Past Surgical History:  Procedure Laterality Date   ABDOMINAL HYSTERECTOMY     ABDOMINAL SURGERY     ACHILLES TENDON  REPAIR     APPENDECTOMY     ARTHROPLASTY     CARPAL TUNNEL RELEASE     CHOLECYSTECTOMY     IR CATHETER TUBE CHANGE  02/02/2021   IR CATHETER TUBE CHANGE  02/25/2021   IR RADIOLOGIST EVAL & MGMT  02/24/2021   IR RADIOLOGIST EVAL & MGMT  03/10/2021   IR US GUIDE BX ASP/DRAIN  11/25/2019   JOINT REPLACEMENT     KNEE ARTHROSCOPY     ORIF ANKLE FRACTURE Right 08/27/2019   Procedure: OPEN REDUCTION INTERNAL FIXATION RIGHT ANKLE FRACTURE;  Surgeon: Renette Butters, MD;  Location: [facility];  Service: Orthopedics;  Laterality: Right;   perineorrophy     TONGUE BIOPSY     Allergies  Allergen Reactions   Penicillins Shortness Of Breath    Other reaction(s): Irregular Heart Rate, Other (See Comments) Rapid heartrate    Alprazolam Hives and Other (See Comments)    Note: tolerates midazolam fine unresponsive Hard to arouse unresponsive Hard to arouse  Other reaction(s): Other (See Comments) unresponsive   Ativan [Lorazepam] Other (See Comments)    Note: tolerates midazolam fine Face & Throat Swelling.   Corticosteroids Other (See Comments)    Other reaction(s): Other (see comments) Psychotic behaviour     Erythromycin     Other reaction(s): Other (See Comments) Severe stomach pain    Erythromycin Base Hives   Milnacipran Hcl Other (See Comments)    Other reaction(s):  psychotic episode      Prednisone Other (See Comments)    Anxiety & Nervous Breakdown.   Savella  [Milnacipran]    Povidone Iodine Rash    Had rash under bandage after surgery , has had betadine since without reaction    Prednisolone Anxiety   Prior to Admission medications   Medication Sig Start Date End Date Taking? Authorizing Provider  busPIRone (BUSPAR) 5 MG tablet Take 5 mg by mouth See admin instructions. Takes 1 tablet in the morning, 1 tablet in the afternoon and 2 tablets at night   Yes [provider]  citalopram (CELEXA) 10 MG tablet Take 1 tablet (10 mg total) by mouth daily. 05/26/21  Yes  [name], MD  cyanocobalamin (,VITAMIN B-12,) 1000 MCG/ML injection Take 1000 mcg IM every day x 5 days, then once weekly x 4 weeks, then once a month thereafter. 05/17/21  Yes Volanda Napoleon, MD  diphenoxylate-atropine (LOMOTIL) 2.5-0.025 MG tablet Take 2 tablets by mouth in the morning, at noon, in the evening, and at bedtime. 04/01/21  Yes [provider]  famotidine (PEPCID) 20 MG tablet Take 2 tablets (40 mg total) by mouth 2 (two) times daily. 03/24/21  Yes Volanda Napoleon, MD  loperamide (IMODIUM) 2 MG capsule Take 4 mg by mouth as needed for diarrhea or loose stools.   Yes [provider]  Melatonin 1 MG CHEW Chew 5 mg by mouth.   Yes [provider]  NEEDLE, DISP, 25 G (EASY TOUCH FLIPLOCK NEEDLES) 25G X 1" MISC Inject 1 each  into the muscle daily. 05/17/21  Yes Volanda Napoleon, MD  ondansetron (ZOFRAN) 8 MG tablet Take 8 mg by mouth every 8 (eight) hours as needed. 03/03/21  Yes [provider]  pantoprazole (PROTONIX) 40 MG tablet Take 40 mg by mouth every morning. 01/01/21  Yes [provider]  promethazine (PHENERGAN) 12.5 MG tablet Take 12.5 mg by mouth 2 (two) times daily as needed. 03/04/21  Yes [provider]  rivaroxaban (XARELTO) 20 MG TABS tablet Take 1 tablet (20 mg total) by mouth daily with supper. 06/03/21  Yes Volanda Napoleon, MD  propranolol (INDERAL) 20 MG tablet Take 1 tablet (20 mg total) by mouth 2 (two) times daily. 02/10/21 03/12/21  Harold Hedge, MD   Social History   Socioeconomic History   Marital status: Married    Spouse name: Not on file   Number of children: 3   Years of education: Not on file   Highest education level: Some college, no degree  Occupational History   Not on file  Tobacco Use   Smoking status: Never   Smokeless tobacco: Never  Vaping Use   Vaping Use: Never used  Substance and Sexual Activity   Alcohol use: No   Drug use: Never   Sexual activity: Yes    Birth  control/protection: None    Comment: hysterectomy  Other Topics Concern   Not on file  Social History Narrative   Right handed   One story home   Drinks occasional caffeine   Social Determinants of Health   Financial Resource Strain: Not on file  Food Insecurity: Not on file  Transportation Needs: Not on file  Physical Activity: Not on file  Stress: Not on file  Social Connections: Not on file  Intimate Partner Violence: Not on file    Observations/Objective: There were no vitals filed for this visit. Euthymic mood on video call, no distress.  Does not appear to be responding to internal stimuli.  Appropriate responses.  All questions were answered with understanding of plan expressed.  Assessment and Plan: Depression with anxiety Some improvement, but appears to tolerate citalopram.  Still pursuing therapy options.  Generalized likely in part due to her physical illness, gastrointestinal illness.  -Option to increase Celexa to 20 mg daily with potential side effects discussed.  Encouraged that she has tolerated 10 mg dose.  Continue to pursue counseling, with option of contacting her oncologist to see if resources may be available through that route.  MyChart update in the next 4 to 6 weeks, 55-monthin office follow-up.  Continue follow-up with her specialists  Follow Up Instructions: As above.   I discussed the assessment and treatment plan with the patient. The patient was provided an opportunity to ask questions and all were answered. The patient agreed with the plan and demonstrated an understanding of the instructions.   The patient was advised to call back or seek an in-person evaluation if the symptoms worsen or if the condition fails to improve as anticipated.  JWendie Agreste MD

## 2021-07-01 NOTE — Patient Instructions (Addendum)
When you are ready, I think it is reasonable to try higher dose of celexa at '20mg'$  per day.  To your pharmacy.  Let me know if you decide to remain at the 10 mg dose Continue to pursue counseling appointment and Dr. Antonieta Pert office may have options as well.  Keep me posted in next month to 6 weeks.

## 2021-07-05 ENCOUNTER — Encounter: Payer: Self-pay | Admitting: Family Medicine

## 2021-07-27 ENCOUNTER — Encounter: Payer: Self-pay | Admitting: Family Medicine

## 2021-07-27 ENCOUNTER — Inpatient Hospital Stay: Attending: Hematology & Oncology

## 2021-07-27 ENCOUNTER — Telehealth: Payer: Self-pay | Admitting: Hematology & Oncology

## 2021-07-27 ENCOUNTER — Encounter: Payer: Self-pay | Admitting: Hematology & Oncology

## 2021-07-27 ENCOUNTER — Inpatient Hospital Stay (HOSPITAL_BASED_OUTPATIENT_CLINIC_OR_DEPARTMENT_OTHER): Admitting: Hematology & Oncology

## 2021-07-27 ENCOUNTER — Other Ambulatory Visit: Payer: Self-pay

## 2021-07-27 VITALS — BP 171/92 | HR 53 | Temp 97.7°F | Resp 18 | Wt 212.0 lb

## 2021-07-27 DIAGNOSIS — D500 Iron deficiency anemia secondary to blood loss (chronic): Secondary | ICD-10-CM

## 2021-07-27 DIAGNOSIS — Z79899 Other long term (current) drug therapy: Secondary | ICD-10-CM | POA: Insufficient documentation

## 2021-07-27 DIAGNOSIS — Z880 Allergy status to penicillin: Secondary | ICD-10-CM | POA: Insufficient documentation

## 2021-07-27 DIAGNOSIS — C181 Malignant neoplasm of appendix: Secondary | ICD-10-CM

## 2021-07-27 DIAGNOSIS — D510 Vitamin B12 deficiency anemia due to intrinsic factor deficiency: Secondary | ICD-10-CM | POA: Diagnosis not present

## 2021-07-27 DIAGNOSIS — K55069 Acute infarction of intestine, part and extent unspecified: Secondary | ICD-10-CM | POA: Diagnosis not present

## 2021-07-27 DIAGNOSIS — M549 Dorsalgia, unspecified: Secondary | ICD-10-CM | POA: Diagnosis not present

## 2021-07-27 DIAGNOSIS — Z7901 Long term (current) use of anticoagulants: Secondary | ICD-10-CM | POA: Diagnosis not present

## 2021-07-27 DIAGNOSIS — C772 Secondary and unspecified malignant neoplasm of intra-abdominal lymph nodes: Secondary | ICD-10-CM

## 2021-07-27 DIAGNOSIS — I82412 Acute embolism and thrombosis of left femoral vein: Secondary | ICD-10-CM

## 2021-07-27 DIAGNOSIS — I2699 Other pulmonary embolism without acute cor pulmonale: Secondary | ICD-10-CM | POA: Diagnosis not present

## 2021-07-27 DIAGNOSIS — R5383 Other fatigue: Secondary | ICD-10-CM | POA: Insufficient documentation

## 2021-07-27 DIAGNOSIS — E039 Hypothyroidism, unspecified: Secondary | ICD-10-CM

## 2021-07-27 DIAGNOSIS — R300 Dysuria: Secondary | ICD-10-CM | POA: Diagnosis not present

## 2021-07-27 DIAGNOSIS — F32A Depression, unspecified: Secondary | ICD-10-CM | POA: Insufficient documentation

## 2021-07-27 LAB — CMP (CANCER CENTER ONLY)
ALT: 22 U/L (ref 0–44)
AST: 30 U/L (ref 15–41)
Albumin: 3.9 g/dL (ref 3.5–5.0)
Alkaline Phosphatase: 156 U/L — ABNORMAL HIGH (ref 38–126)
Anion gap: 9 (ref 5–15)
BUN: 21 mg/dL — ABNORMAL HIGH (ref 6–20)
CO2: 30 mmol/L (ref 22–32)
Calcium: 9.7 mg/dL (ref 8.9–10.3)
Chloride: 102 mmol/L (ref 98–111)
Creatinine: 0.9 mg/dL (ref 0.44–1.00)
GFR, Estimated: >60 mL/min (ref >60)
Glucose, Bld: 144 mg/dL — ABNORMAL HIGH (ref 70–99)
Potassium: 3.7 mmol/L (ref 3.5–5.1)
Sodium: 141 mmol/L (ref 135–145)
Total Bilirubin: 0.4 mg/dL (ref 0.3–1.2)
Total Protein: 7.2 g/dL (ref 6.5–8.1)

## 2021-07-27 LAB — CBC WITH DIFFERENTIAL (CANCER CENTER ONLY)
Abs Immature Granulocytes: 0.02 10*3/uL (ref 0.00–0.07)
Basophils Absolute: 0.1 10*3/uL (ref 0.0–0.1)
Basophils Relative: 1 %
Eosinophils Absolute: 0.3 10*3/uL (ref 0.0–0.5)
Eosinophils Relative: 3 %
HCT: 38.8 % (ref 36.0–46.0)
Hemoglobin: 12.7 g/dL (ref 12.0–15.0)
Immature Granulocytes: 0 %
Lymphocytes Relative: 46 %
Lymphs Abs: 3.5 10*3/uL (ref 0.7–4.0)
MCH: 31.4 pg (ref 26.0–34.0)
MCHC: 32.7 g/dL (ref 30.0–36.0)
MCV: 95.8 fL (ref 80.0–100.0)
Monocytes Absolute: 0.6 10*3/uL (ref 0.1–1.0)
Monocytes Relative: 8 %
Neutro Abs: 3.3 10*3/uL (ref 1.7–7.7)
Neutrophils Relative %: 42 %
Platelet Count: 236 10*3/uL (ref 150–400)
RBC: 4.05 MIL/uL (ref 3.87–5.11)
RDW: 12.9 % (ref 11.5–15.5)
WBC Count: 7.7 10*3/uL (ref 4.0–10.5)
nRBC: 0 % (ref 0.0–0.2)

## 2021-07-27 LAB — LACTATE DEHYDROGENASE: LDH: 205 U/L — ABNORMAL HIGH (ref 98–192)

## 2021-07-27 LAB — CEA (IN HOUSE-CHCC): CEA (CHCC-In House): <1 ng/mL (ref 0.00–5.00)

## 2021-07-27 LAB — VITAMIN D 25 HYDROXY (VIT D DEFICIENCY, FRACTURES): Vit D, 25-Hydroxy: 36.18 ng/mL (ref 30–100)

## 2021-07-27 LAB — TSH: TSH: 2.248 u[IU]/mL (ref 0.308–3.960)

## 2021-07-27 MED ORDER — HYDROMORPHONE HCL 2 MG PO TABS: 2.0000 mg | ORAL_TABLET | Freq: Four times a day (QID) | ORAL | 0 refills | Status: DC | PRN

## 2021-07-27 NOTE — Telephone Encounter (Signed)
[facility] Citalopram and Zofran interact pt is asking if he needs to make a change or monitor  Please advise.   Also FYI he got scheduled with Psych 08/26/21

## 2021-07-27 NOTE — Telephone Encounter (Signed)
Scheduled appt per 10/4 los - mailed letter with appt date and time

## 2021-07-27 NOTE — Telephone Encounter (Signed)
Please Advise

## 2021-07-27 NOTE — Progress Notes (Signed)
Hematology and Oncology Follow Up Visit  Wendy Barber [id number] [birth year] 60 y.o. 07/27/2021   Principle Diagnosis:  History of metastatic appendiceal cancer  -- recurrent  Superior mesenteric vein thrombus Tthromboembolism of the RIGHT leg Pernicious anemia  Current Therapy:   HIPEC - Surgery done in [place] in 11/2020 Xarelto 20 mg p.o. daily Vitamin B12 1000 mcg IM monthly     Interim History:  Wendy Barber is back for follow-up.  I must say that she actually looks quite good.  She is lost quite a bit of weight.  I know this is not how she would like to have lost weight but she looks quite good.  We did find that she had vitamin B12 deficiency.  She is getting B12 injections.  It is hard to say how well this is helped her..    She actually did see Dr. Sharon Mt during the summer.  He looked at her CAT scan that she had done.  He was pleased with how everything looks.  He we will see her back in 6 more months.  She is still on Xarelto.  She is on 20 mg a day.  I will continue her on Xarelto.  She does have what is likely short gut syndrome.  She has a lot of diarrhea.  Her Gastroenterologist is try to get her on good bacteria to try to populate her intestines to help with the diarrhea.  She has had no bleeding.  There is still quite a bit of pain.  She is on Dilaudid which does seem to help her.  She has had no fever.  She has had no cough or shortness of breath.  We did do a Chromogranin A level on her.  This was back in July.  This was 330.  This was repeated up in [place] and it was 80.  I am not sure as to why she did have the elevated level.  She has had no rashes.  Overall, I would have to say that her performance status is ECOG 1.   Medications:  Current Outpatient Medications:    busPIRone (BUSPAR) 5 MG tablet, Take 5 mg by mouth See admin instructions. Takes 1 tablet in the morning, 1 tablet in the afternoon and 2 tablets at night, Disp: , Rfl:    citalopram  (CELEXA) 20 MG tablet, Take 1 tablet (20 mg total) by mouth daily., Disp: 90 tablet, Rfl: 1   cyanocobalamin (,VITAMIN B-12,) 1000 MCG/ML injection, Take 1000 mcg IM every day x 5 days, then once weekly x 4 weeks, then once a month thereafter., Disp: 12 mL, Rfl: 2   diphenoxylate-atropine (LOMOTIL) 2.5-0.025 MG tablet, Take 2 tablets by mouth in the morning, at noon, in the evening, and at bedtime., Disp: , Rfl:    famotidine (PEPCID) 20 MG tablet, Take 2 tablets (40 mg total) by mouth 2 (two) times daily., Disp: 360 tablet, Rfl: 3   loperamide (IMODIUM) 2 MG capsule, Take 4 mg by mouth in the morning, at noon, in the evening, and at bedtime., Disp: , Rfl:    Melatonin 1 MG CHEW, Chew 5 mg by mouth., Disp: , Rfl:    NEEDLE, DISP, 25 G (EASY TOUCH FLIPLOCK NEEDLES) 25G X 1" MISC, Inject 1 each into the muscle daily., Disp: 12 each, Rfl: 2   ondansetron (ZOFRAN) 8 MG tablet, Take 8 mg by mouth every 8 (eight) hours as needed., Disp: , Rfl:    pantoprazole (PROTONIX) 40 MG tablet, Take 40 mg  by mouth every morning., Disp: , Rfl:    promethazine (PHENERGAN) 12.5 MG tablet, Take 12.5 mg by mouth 2 (two) times daily as needed., Disp: , Rfl:    propranolol (INDERAL) 20 MG tablet, Take 20 mg by mouth 2 (two) times daily., Disp: , Rfl:    rivaroxaban (XARELTO) 20 MG TABS tablet, Take 1 tablet (20 mg total) by mouth daily with supper., Disp: 30 tablet, Rfl: 4   propranolol (INDERAL) 20 MG tablet, Take 1 tablet (20 mg total) by mouth 2 (two) times daily., Disp: 60 tablet, Rfl: 0  Allergies:  Allergies  Allergen Reactions   Penicillins Shortness Of Breath    Other reaction(s): Irregular Heart Rate, Other (See Comments) Rapid heartrate    Alprazolam Hives and Other (See Comments)    Note: tolerates midazolam fine unresponsive Hard to arouse unresponsive Hard to arouse  Other reaction(s): Other (See Comments) unresponsive   Ativan [Lorazepam] Other (See Comments)    Note: tolerates midazolam  fine Face & Throat Swelling.   Corticosteroids Other (See Comments)    Other reaction(s): Other (see comments) Psychotic behaviour     Erythromycin     Other reaction(s): Other (See Comments) Severe stomach pain    Erythromycin Base Hives   Milnacipran Hcl Other (See Comments)    Other reaction(s):  psychotic episode      Prednisone Other (See Comments)    Anxiety & Nervous Breakdown.   Savella  [Milnacipran]    Povidone Iodine Rash    Had rash under bandage after surgery , has had betadine since without reaction    Prednisolone Anxiety    Past Medical History, Surgical history, Social history, and Family History were reviewed and updated.  Review of Systems: Review of Systems  Constitutional:  Positive for fatigue and unexpected weight change.  HENT:  Negative.    Eyes: Negative.   Respiratory: Negative.    Cardiovascular: Negative.   Gastrointestinal:  Positive for abdominal pain and diarrhea.  Endocrine: Negative.   Genitourinary:  Positive for dysuria.   Musculoskeletal:  Positive for back pain.  Skin: Negative.   Neurological: Negative.   Hematological: Negative.   Psychiatric/Behavioral:  Positive for depression.    Physical Exam:  weight is 212 lb (96.2 kg). Her oral temperature is 97.7 F (36.5 C). Her blood pressure is 171/92 (abnormal) and her pulse is 53 (abnormal). Her respiration is 18 and oxygen saturation is 99%.   Wt Readings from Last 3 Encounters:  07/27/21 212 lb (96.2 kg)  05/26/21 213 lb 3.2 oz (96.7 kg)  05/13/21 212 lb (96.2 kg)    Physical Exam Vitals reviewed.  HENT:     Head: Normocephalic and atraumatic.  Eyes:     Pupils: Pupils are equal, round, and reactive to light.  Cardiovascular:     Rate and Rhythm: Normal rate and regular rhythm.     Heart sounds: Normal heart sounds.  Pulmonary:     Effort: Pulmonary effort is normal.     Breath sounds: Normal breath sounds.  Abdominal:     General: Bowel sounds are normal.      Palpations: Abdomen is soft.     Comments: Abdominal exam shows healed laparotomy scar.  She has no fluid wave.  There is no guarding or rebound tenderness.  She has decent bowel sounds.  There is no palpable liver or spleen tip.  Musculoskeletal:        General: No tenderness or deformity. Normal range of motion.  Cervical back: Normal range of motion.  Lymphadenopathy:     Cervical: No cervical adenopathy.  Skin:    General: Skin is warm and dry.     Findings: No erythema or rash.  Neurological:     Mental Status: She is alert and oriented to person, place, and time.  Psychiatric:        Behavior: Behavior normal.        Thought Content: Thought content normal.        Judgment: Judgment normal.     Lab Results  Component Value Date   WBC 7.7 07/27/2021   HGB 12.7 07/27/2021   HCT 38.8 07/27/2021   MCV 95.8 07/27/2021   PLT 236 07/27/2021     Chemistry      Component Value Date/Time   NA 141 07/27/2021 0914   K 3.7 07/27/2021 0914   CL 102 07/27/2021 0914   CO2 30 07/27/2021 0914   BUN 21 (H) 07/27/2021 0914   CREATININE 0.90 07/27/2021 0914      Component Value Date/Time   CALCIUM 9.7 07/27/2021 0914   ALKPHOS 156 (H) 07/27/2021 0914   AST 30 07/27/2021 0914   ALT 22 07/27/2021 0914   BILITOT 0.4 07/27/2021 0914      Impression and Plan: Wendy Barber is a very charming 60 year-old white female.  She has incredible history.  She now has had a recurrence of her appendiceal cancer.  I am just absolutely amazed by this.  It has been about 15 years or so since she had her initial appendiceal cancer removed.  She will back up to [facility] for another HIPEC procedure.  She had a lot of issues afterwards.  She was hospitalized here with a lot of pain and diarrhea and nonhealing.  She eventually did improve.  We will have to follow her with CT scans.  I know that she certainly is at risk for recurrence.  She has a low-grade mucinous appendiceal cancer.  I know this does  tend to try to recur.  As such, we will have to follow-up with a CT scan in early December.  Forgot to mention that she was down at [place].  She was with family.  She goes to one location with family yearly.  We will continue her on the Xarelto.  I do think that she is at risk for recurrence of thromboembolic disease.  At some point, we may want to decrease her dose down to 10 mg a day.  I think she is getting her B12 injections with her family doctor.  I am just happy that she looks good and feels good.  Hopefully the diarrhea will improve.  I know her Gastroenterologist is working hard to get the diarrhea under better control so she does not have to go to the bathroom all the time.  We will do a CT scan the same day that we see her.   Volanda Napoleon, MD 10/4/20221:07 PM

## 2021-08-25 ENCOUNTER — Other Ambulatory Visit: Payer: Self-pay

## 2021-08-25 ENCOUNTER — Encounter: Payer: Self-pay | Admitting: Behavioral Health

## 2021-08-25 ENCOUNTER — Ambulatory Visit (INDEPENDENT_AMBULATORY_CARE_PROVIDER_SITE_OTHER): Admitting: Behavioral Health

## 2021-08-25 VITALS — BP 147/92 | HR 64 | Ht 66.0 in | Wt 210.0 lb

## 2021-08-25 DIAGNOSIS — F331 Major depressive disorder, recurrent, moderate: Secondary | ICD-10-CM | POA: Diagnosis not present

## 2021-08-25 DIAGNOSIS — F99 Mental disorder, not otherwise specified: Secondary | ICD-10-CM | POA: Diagnosis not present

## 2021-08-25 DIAGNOSIS — F411 Generalized anxiety disorder: Secondary | ICD-10-CM

## 2021-08-25 DIAGNOSIS — F5105 Insomnia due to other mental disorder: Secondary | ICD-10-CM | POA: Diagnosis not present

## 2021-08-25 MED ORDER — BELSOMRA 5 MG PO TABS: 5.0000 mg | ORAL_TABLET | Freq: Every evening | ORAL | 0 refills | Status: DC | PRN

## 2021-08-25 NOTE — Progress Notes (Signed)
[facility] MD/PA/NP Initial Note  08/25/2021 9:04 PM Wendy Barber  MRN:  [id number]  Chief Complaint:  Chief Complaint   Depression; Insomnia; Anxiety; Medication Refill; Establish Care; Medication Problem; Stress     HPI:  60 year old female presents to this office for initial visit and to establish care. She says that she has a very lengthy history of anxiety and depression stemming back to high school. She has tried various AD medication over the years. 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 She had full hysterectomy with a bowel resection. After intensive care and recovery she went another 17 years without major problems. 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 She underwent similar surgery and chemo in February 2022 but also removing more bowel. She now has short bowel syndrome with chronic diarrhea and poor nutritional absorption. She says that the trauma of all the chronic illness has left her with severe depression and anxiety. Her PCP initially placed her on buspar for anxiety which is working well and citalopram for depression. She said there were concerns about medication with her condition and increasing the dosage. She would like to consider and increase of the citalopram at this time. She says her anxiety today is 7/10 and depression is 7/10. She also is reporting problems with sleep and sometimes cannot fall asleep to 5 am. She is open to trying a safe, effective medication. She denies mania, no psychosis. She has intermittent SI but with no plan. She says that she would not harm herself because of love to her family and her spiritual beliefs. She verbally contracted for safety to this Probation officer. No HI.   Past psychiatric medication trials: Prozac- night  terror Wellbutrin Cymbalta Lexapro            Visit Diagnosis:    ICD-10-CM   1. Insomnia due to other mental disorder  F51.05 Suvorexant (BELSOMRA) 5 MG TABS   F99     2. Generalized anxiety disorder  F41.1     3. 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[place]) 03/19/2020   Chronic fatigue syndrome    Diabetes mellitus without complication (HCC)    DVT of deep femoral vein, left ([place]) 03/19/2020   Fibromyalgia    Goals of care, counseling/discussion 03/19/2020   Hypertension    Iron deficiency anemia due to chronic blood loss 05/14/2021   Malignant pseudomyxoma peritonei ([place]) 03/19/2020   Pernicious anemia 05/14/2021   Presence of IVC filter 03/19/2020   Pulmonary embolism, bilateral ([place]) 03/19/2020    Past Surgical History:  Procedure Laterality Date   ABDOMINAL HYSTERECTOMY     ABDOMINAL SURGERY     ACHILLES TENDON REPAIR     APPENDECTOMY     ARTHROPLASTY     CARPAL TUNNEL RELEASE     CHOLECYSTECTOMY     IR CATHETER TUBE CHANGE  02/02/2021   IR CATHETER TUBE CHANGE  02/25/2021   IR RADIOLOGIST EVAL & MGMT  02/24/2021   IR RADIOLOGIST EVAL & MGMT  03/10/2021   IR US GUIDE BX ASP/DRAIN  11/25/2019   JOINT REPLACEMENT     KNEE ARTHROSCOPY     ORIF ANKLE FRACTURE Right 08/27/2019   Procedure: OPEN REDUCTION INTERNAL FIXATION RIGHT ANKLE FRACTURE;  Surgeon: Renette Butters, MD;  Location: [facility];  Service: Orthopedics;  Laterality: Right;   perineorrophy     TONGUE BIOPSY      Family Psychiatric History: see chart  Family History:  Family History  Problem Relation Age of Onset   Depression Mother    Drug abuse Sister    Suicidality Sister    Alcohol abuse Brother    Drug abuse  Brother     Social History:  Social History   Socioeconomic History   Marital status: Married    Spouse name: Not on file   Number of children: 3   Years of education: Not on file   Highest education level: Some college, no degree  Occupational History   Occupation: disability  Tobacco Use   Smoking status: Never   Smokeless tobacco: Never  Vaping Use   Vaping Use: Never used  Substance and Sexual Activity   Alcohol use: No   Drug use: Never   Sexual activity: Yes    Birth control/protection: None    Comment: hysterectomy  Other Topics Concern   Not on file  Social History Narrative   Right handed   One story home   Drinks occasional caffeine   Social Determinants of Health   Financial Resource Strain: Not on file  Food Insecurity: Not on file  Transportation Needs: Not on file  Physical Activity: Not on file  Stress: Not on file  Social Connections: Not on file    Allergies:  Allergies  Allergen Reactions   Penicillins Shortness Of Breath    Other reaction(s): Irregular Heart Rate, Other (See Comments) Rapid heartrate    Alprazolam Hives and Other (See Comments)    Note: tolerates midazolam fine unresponsive Hard to arouse unresponsive Hard to arouse  Other reaction(s): Other (See Comments) unresponsive   Ativan [Lorazepam] Other (See Comments)    Note: tolerates midazolam fine Face & Throat Swelling.   Corticosteroids Other (See Comments)    Other reaction(s): Other (see comments) Psychotic behaviour     Erythromycin     Other reaction(s): Other (See Comments) Severe stomach pain    Erythromycin Base Hives   Milnacipran Hcl Other (See Comments)    Other reaction(s):  psychotic episode      Prednisone Other (See Comments)    Anxiety & Nervous Breakdown.   Savella  [Milnacipran]    Povidone Iodine Rash    Had rash under bandage after surgery , has had betadine since without reaction    Prednisolone Anxiety    Metabolic Disorder  Labs: Lab Results  Component Value Date   HGBA1C 5.8 (H) 01/16/2021   MPG 119.76 01/16/2021   No results found for: PROLACTIN No results found for: CHOL, TRIG, HDL, CHOLHDL, VLDL, LDLCALC Lab Results  Component Value Date   TSH 2.248 07/27/2021   TSH 1.185 05/13/2021    Therapeutic Level Labs: No results found for: LITHIUM No results found for: VALPROATE No components found for:  CBMZ  Current Medications: Current Outpatient Medications  Medication Sig Dispense Refill   busPIRone (BUSPAR) 5 MG tablet Take 5 mg by mouth See admin instructions. Takes 1 tablet in the morning, 1 tablet in the afternoon and 2 tablets at night     cyanocobalamin (,VITAMIN B-12,)  1000 MCG/ML injection Take 1000 mcg IM every day x 5 days, then once weekly x 4 weeks, then once a month thereafter. 12 mL 2   diphenoxylate-atropine (LOMOTIL) 2.5-0.025 MG tablet Take 2 tablets by mouth in the morning, at noon, in the evening, and at bedtime.     famotidine (PEPCID) 20 MG tablet Take 2 tablets (40 mg total) by mouth 2 (two) times daily. 360 tablet 3   HYDROmorphone (DILAUDID) 2 MG tablet Take 1 tablet (2 mg total) by mouth every 6 (six) hours as needed for severe pain. 60 tablet 0   loperamide (IMODIUM) 2 MG capsule Take 4 mg by mouth in the morning, at noon, in the evening, and at bedtime.     Melatonin 1 MG CHEW Chew 5 mg by mouth.     NEEDLE, DISP, 25 G (EASY TOUCH FLIPLOCK NEEDLES) 25G X 1" MISC Inject 1 each into the muscle daily. 12 each 2   ondansetron (ZOFRAN) 8 MG tablet Take 8 mg by mouth every 8 (eight) hours as needed.     pantoprazole (PROTONIX) 40 MG tablet Take 40 mg by mouth every morning.     promethazine (PHENERGAN) 12.5 MG tablet Take 12.5 mg by mouth 2 (two) times daily as needed.     propranolol (INDERAL) 20 MG tablet Take 20 mg by mouth 2 (two) times daily.     rivaroxaban (XARELTO) 20 MG TABS tablet Take 1 tablet (20 mg total) by mouth daily with supper. 30 tablet 4   Suvorexant (BELSOMRA)  5 MG TABS Take 5 mg by mouth at bedtime as needed. 30 tablet 0   BD INTEGRA SYRINGE 25G X 1" 3 ML MISC Inject into the muscle daily.     citalopram (CELEXA) 20 MG tablet Take 1 tablet (20 mg total) by mouth daily. 90 tablet 1   propranolol (INDERAL) 20 MG tablet Take 1 tablet (20 mg total) by mouth 2 (two) times daily. 60 tablet 0   XIFAXAN 550 MG TABS tablet Take 550 mg by mouth 3 (three) times daily.     No current facility-administered medications for this visit.    Medication Side Effects: none  Orders placed this visit:  No orders of the defined types were placed in this encounter.   Psychiatric Specialty Exam:  Review of Systems  Constitutional:  Positive for fatigue and unexpected weight change.  Eyes:  Positive for pain and visual disturbance.  Gastrointestinal:  Positive for abdominal pain, blood in stool, constipation, diarrhea, nausea and vomiting.  Genitourinary:  Positive for dysuria, flank pain, frequency, hematuria and urgency.  Musculoskeletal:  Positive for back pain, joint swelling, myalgias and neck pain.  Neurological:  Positive for dizziness, tremors, weakness and headaches.   There were no vitals taken for this visit.There is no height or weight on file to calculate BMI.  General Appearance: Casual, Neat, and Well Groomed  Eye Contact:  Good  Speech:  Clear and Coherent and Talkative  Volume:  Normal  Mood:  Depressed  Affect:  Appropriate, Flat, and Tearful  Thought Process:  Coherent  Orientation:  Full (Time, Place, and Person)  Thought Content: Logical   Suicidal Thoughts:  Yes.  without intent/plan  Homicidal Thoughts:  No  Memory:  WNL  Judgement:  Good  Insight:  Good  Psychomotor Activity:  Normal  Concentration:  Concentration: Good  Recall:  Good  Fund of Knowledge: Good  Language: Good  Assets:  Desire for Improvement  ADL's:  Intact  Cognition: WNL  Prognosis:  Good   Screenings:  GAD-7    Flowsheet Row Office Visit from 05/26/2021 in  [facility]  Total GAD-7 Score 18      PHQ2-9    [place] Visit from 08/25/2021 in [place] Video Visit from 07/01/2021 in [place] Office Visit from 05/26/2021 in [place] Office Visit from 05/05/2021 in [place] Office Visit from 02/19/2021 in [facility]  PHQ-2 Total Score 6 6 6 5 6   PHQ-9 Total Score 26 22 25 20 25       [place] Office Visit from 05/05/2021 in [facility] Office Visit from 02/19/2021 in [facility] for [facility] ED to Hosp-Admission (Discharged) from 01/16/2021 in [place] Error: Question 6 not populated Low Risk No Risk       Receiving Psychotherapy: No   Treatment Plan/Recommendations:  Greater than 50% of 60 min face to face time with patient was spent on counseling and coordination of care. We discussed  her long history of anxiety and depression as well as extensive chronic illness related to cancer x 2.  We discussed her strong family support and current progress. Discussed her goals, expectations, and continued treatment.  We agreed to continue:  Will continue Buspar 5 mg in the am, 5 mg in the afternoon, and 10 mg at bedtime.  To increase Celexa to 20 mg daily. Will take with food to help with nausea. Could make diarrhea worse in the beginning but may pass after a week or two.  To start Belsomra 5 mg at bedtime daily for sleep Will report worsening symptoms or side effects promptly To follow up in 4 weeks to reassess Will continue to follow up with PCP regularly Provided emergency contact information Reviewed [place], NP

## 2021-08-27 ENCOUNTER — Telehealth: Payer: Self-pay

## 2021-08-27 NOTE — Telephone Encounter (Signed)
Prior Authorization submitted for BELSOMRA 5 MG with [employer] ID# [id number] Pending response   Preferred are Zolpidem IR/ER, lunesta, zaleplon, Dayvigo

## 2021-08-31 NOTE — Telephone Encounter (Signed)
Ok thank you 

## 2021-08-31 NOTE — Telephone Encounter (Signed)
FYI, I'm still waiting on a response for her PA

## 2021-09-01 NOTE — Telephone Encounter (Signed)
Received a DENIAL for Belsomra per [employer] they require she try and fail Zolpidem ER or Eszopiclone.   Aaron Edelman notified and will follow up with pt after review .

## 2021-09-02 ENCOUNTER — Other Ambulatory Visit: Payer: Self-pay | Admitting: Behavioral Health

## 2021-09-02 DIAGNOSIS — F5105 Insomnia due to other mental disorder: Secondary | ICD-10-CM

## 2021-09-02 MED ORDER — ESZOPICLONE 2 MG PO TABS: 2.0000 mg | ORAL_TABLET | Freq: Every evening | ORAL | 1 refills | Status: DC | PRN

## 2021-09-02 NOTE — Telephone Encounter (Signed)
Called Pt directly Prescribed Lunesta 2 mg to take at bedtime. Canceled Dayvigo due to insurance denial. Pt will report on effectiveness. No further action at this time.

## 2021-09-09 ENCOUNTER — Encounter: Payer: Self-pay | Admitting: Hematology & Oncology

## 2021-09-22 ENCOUNTER — Ambulatory Visit (INDEPENDENT_AMBULATORY_CARE_PROVIDER_SITE_OTHER): Admitting: Behavioral Health

## 2021-09-22 ENCOUNTER — Other Ambulatory Visit: Payer: Self-pay

## 2021-09-22 ENCOUNTER — Encounter: Payer: Self-pay | Admitting: Behavioral Health

## 2021-09-22 DIAGNOSIS — F331 Major depressive disorder, recurrent, moderate: Secondary | ICD-10-CM

## 2021-09-22 DIAGNOSIS — F411 Generalized anxiety disorder: Secondary | ICD-10-CM | POA: Diagnosis not present

## 2021-09-22 DIAGNOSIS — F418 Other specified anxiety disorders: Secondary | ICD-10-CM | POA: Diagnosis not present

## 2021-09-22 DIAGNOSIS — F99 Mental disorder, not otherwise specified: Secondary | ICD-10-CM

## 2021-09-22 DIAGNOSIS — F5105 Insomnia due to other mental disorder: Secondary | ICD-10-CM | POA: Diagnosis not present

## 2021-09-22 MED ORDER — CITALOPRAM HYDROBROMIDE 20 MG PO TABS: 20.0000 mg | ORAL_TABLET | Freq: Every day | ORAL | 2 refills | Status: DC

## 2021-09-22 MED ORDER — ESZOPICLONE 2 MG PO TABS: 2.0000 mg | ORAL_TABLET | Freq: Every evening | ORAL | 2 refills | Status: DC | PRN

## 2021-09-22 MED ORDER — BUSPIRONE HCL 15 MG PO TABS: 15.0000 mg | ORAL_TABLET | Freq: Two times a day (BID) | ORAL | 2 refills | Status: DC

## 2021-09-22 MED ORDER — PROPRANOLOL HCL 20 MG PO TABS: 20.0000 mg | ORAL_TABLET | Freq: Two times a day (BID) | ORAL | 1 refills | Status: DC

## 2021-09-22 NOTE — Progress Notes (Signed)
[facility] Med Check  Patient ID: Wendy Barber,  MRN: [id number]  PCP: [name], MD  Date of Evaluation: 09/22/2021 Time spent:30 minutes  Chief Complaint:  Chief Complaint   Anxiety; Depression; Follow-up; Medication Refill     HISTORY/CURRENT STATUS: HPI  60 year old female presents to this office for follow up and medication management. She says that she "has been holding her own" with anxiety and depression but has good and bad days. Says she is interested in increasing dosage of Buspar for increased anxiety. She says this medication seems to work well for her. Reports her anxiety today at 5/10 and depression at 4/10. She is sleeping 7 hours per night with Lunesta and is happy with the med except for metallic taste in her mouth the next day. She still wants to continue. Her chronic medical issues continue to be source of depression. She understands that having short bowel syndrome is lifetime struggle. Her family remains highly supportive. She denies any mania, no psychosis, No current SI or HI.   Past psychiatric medication trials: Prozac- night terror Wellbutrin Cymbalta Lexapro       Individual Medical History/ Review of Systems: Changes? :No   Allergies: Penicillins, Alprazolam, Ativan [lorazepam], Corticosteroids, Erythromycin, Erythromycin base, Milnacipran hcl, Prednisone, Savella  [milnacipran], Povidone iodine, and Prednisolone  Current Medications:  Current Outpatient Medications:    busPIRone (BUSPAR) 15 MG tablet, Take 1 tablet (15 mg total) by mouth 2 (two) times daily., Disp: 60 tablet, Rfl: 2   BD INTEGRA SYRINGE 25G X 1" 3 ML MISC, Inject into the muscle daily., Disp: , Rfl:    citalopram (CELEXA) 20 MG tablet, Take 1 tablet (20 mg total) by mouth daily., Disp: 90 tablet, Rfl: 2   cyanocobalamin (,VITAMIN B-12,) 1000 MCG/ML injection, Take 1000 mcg IM every day x 5 days, then once weekly x 4 weeks, then once a month thereafter., Disp: 12 mL,  Rfl: 2   diphenoxylate-atropine (LOMOTIL) 2.5-0.025 MG tablet, Take 2 tablets by mouth in the morning, at noon, in the evening, and at bedtime., Disp: , Rfl:    eszopiclone (LUNESTA) 2 MG TABS tablet, Take 1 tablet (2 mg total) by mouth at bedtime as needed for sleep. Take immediately before bedtime, Disp: 30 tablet, Rfl: 2   famotidine (PEPCID) 20 MG tablet, Take 2 tablets (40 mg total) by mouth 2 (two) times daily., Disp: 360 tablet, Rfl: 3   HYDROmorphone (DILAUDID) 2 MG tablet, Take 1 tablet (2 mg total) by mouth every 6 (six) hours as needed for severe pain., Disp: 60 tablet, Rfl: 0   loperamide (IMODIUM) 2 MG capsule, Take 4 mg by mouth in the morning, at noon, in the evening, and at bedtime., Disp: , Rfl:    Melatonin 1 MG CHEW, Chew 5 mg by mouth., Disp: , Rfl:    NEEDLE, DISP, 25 G (EASY TOUCH FLIPLOCK NEEDLES) 25G X 1" MISC, Inject 1 each into the muscle daily., Disp: 12 each, Rfl: 2   ondansetron (ZOFRAN) 8 MG tablet, Take 8 mg by mouth every 8 (eight) hours as needed., Disp: , Rfl:    pantoprazole (PROTONIX) 40 MG tablet, Take 40 mg by mouth every morning., Disp: , Rfl:    promethazine (PHENERGAN) 12.5 MG tablet, Take 12.5 mg by mouth 2 (two) times daily as needed., Disp: , Rfl:    propranolol (INDERAL) 20 MG tablet, Take 20 mg by mouth 2 (two) times daily., Disp: , Rfl:    propranolol (INDERAL) 20 MG tablet, Take  1 tablet (20 mg total) by mouth 2 (two) times daily., Disp: 180 tablet, Rfl: 1   rivaroxaban (XARELTO) 20 MG TABS tablet, Take 1 tablet (20 mg total) by mouth daily with supper., Disp: 30 tablet, Rfl: 4   Suvorexant (BELSOMRA) 5 MG TABS, Take 5 mg by mouth at bedtime as needed., Disp: 30 tablet, Rfl: 0   XIFAXAN 550 MG TABS tablet, Take 550 mg by mouth 3 (three) times daily., Disp: , Rfl:  Medication Side Effects: none  Family Medical/ Social History: Changes? No  MENTAL HEALTH EXAM:  There were no vitals taken for this visit.There is no height or weight on file to  calculate BMI.  General Appearance: Casual and Neat  Eye Contact:  Good  Speech:  Clear and Coherent  Volume:  Normal  Mood:  NA  Affect:  Appropriate  Thought Process:  Coherent  Orientation:  Full (Time, Place, and Person)  Thought Content: Logical   Suicidal Thoughts:  No  Homicidal Thoughts:  No  Memory:  WNL  Judgement:  Good  Insight:  Good  Psychomotor Activity:  Normal  Concentration:  Concentration: Good  Recall:  Good  Fund of Knowledge: Good  Language: Good  Assets:  Desire for Improvement  ADL's:  Intact  Cognition: WNL  Prognosis:  Good    DIAGNOSES:    ICD-10-CM   1. Insomnia due to other mental disorder  F51.05 eszopiclone (LUNESTA) 2 MG TABS tablet   F99     2. Generalized anxiety disorder  F41.1 busPIRone (BUSPAR) 15 MG tablet    propranolol (INDERAL) 20 MG tablet    3. Major depressive disorder, recurrent episode, moderate (HCC)  F33.1 busPIRone (BUSPAR) 15 MG tablet    4. Depression with anxiety  F41.8 citalopram (CELEXA) 20 MG tablet      Receiving Psychotherapy: No    RECOMMENDATIONS:   Greater than 50% of 40 min face to face time with patient was spent on counseling and coordination of care. We discussed her current level of stability with anxiety and depression as well as extensive chronic illness related to cancer x 2.  We discussed her strong family support and current progress.Her husband is now home for good and will be officially retiring in February. She is happy about this. Discussed her goals, challenges and expectations, and continued treatment.plan.  We agreed to continue:   Will increase Buspar  to 15 mg twice daily. To increase Celexa to 20 mg daily. Will take with food to help with nausea. Could make diarrhea worse in the beginning but may pass after a week or two.  To Continue Lunesta 5 mg at betime. Belsomra was not approved by insurance. She is happy with Johnnye Sima but says causes metallic taste in mouth next day.  Will report  worsening symptoms or side effects promptly To follow up in 2 months to reassess Will continue to follow up with PCP regularly Provided emergency contact information Reviewed [place], NP

## 2021-09-29 ENCOUNTER — Other Ambulatory Visit: Payer: Self-pay

## 2021-09-29 ENCOUNTER — Ambulatory Visit (HOSPITAL_BASED_OUTPATIENT_CLINIC_OR_DEPARTMENT_OTHER)
Admission: RE | Admit: 2021-09-29 | Discharge: 2021-09-29 | Disposition: A | Discharge status: Home / Self Care | Source: Ambulatory Visit | Attending: Hematology & Oncology | Admitting: Hematology & Oncology

## 2021-09-29 ENCOUNTER — Inpatient Hospital Stay: Attending: Hematology & Oncology | Admitting: Hematology & Oncology

## 2021-09-29 ENCOUNTER — Inpatient Hospital Stay

## 2021-09-29 ENCOUNTER — Encounter (HOSPITAL_BASED_OUTPATIENT_CLINIC_OR_DEPARTMENT_OTHER): Payer: Self-pay

## 2021-09-29 DIAGNOSIS — R519 Headache, unspecified: Secondary | ICD-10-CM | POA: Insufficient documentation

## 2021-09-29 DIAGNOSIS — I2699 Other pulmonary embolism without acute cor pulmonale: Secondary | ICD-10-CM

## 2021-09-29 DIAGNOSIS — Z79899 Other long term (current) drug therapy: Secondary | ICD-10-CM | POA: Insufficient documentation

## 2021-09-29 DIAGNOSIS — K7460 Unspecified cirrhosis of liver: Secondary | ICD-10-CM | POA: Insufficient documentation

## 2021-09-29 DIAGNOSIS — R300 Dysuria: Secondary | ICD-10-CM | POA: Insufficient documentation

## 2021-09-29 DIAGNOSIS — C772 Secondary and unspecified malignant neoplasm of intra-abdominal lymph nodes: Secondary | ICD-10-CM

## 2021-09-29 DIAGNOSIS — R5383 Other fatigue: Secondary | ICD-10-CM | POA: Diagnosis not present

## 2021-09-29 DIAGNOSIS — D500 Iron deficiency anemia secondary to blood loss (chronic): Secondary | ICD-10-CM | POA: Diagnosis not present

## 2021-09-29 DIAGNOSIS — K55069 Acute infarction of intestine, part and extent unspecified: Secondary | ICD-10-CM | POA: Insufficient documentation

## 2021-09-29 DIAGNOSIS — C181 Malignant neoplasm of appendix: Secondary | ICD-10-CM

## 2021-09-29 DIAGNOSIS — F32A Depression, unspecified: Secondary | ICD-10-CM | POA: Diagnosis not present

## 2021-09-29 DIAGNOSIS — I82401 Acute embolism and thrombosis of unspecified deep veins of right lower extremity: Secondary | ICD-10-CM | POA: Diagnosis not present

## 2021-09-29 DIAGNOSIS — Z7901 Long term (current) use of anticoagulants: Secondary | ICD-10-CM | POA: Diagnosis not present

## 2021-09-29 DIAGNOSIS — D510 Vitamin B12 deficiency anemia due to intrinsic factor deficiency: Secondary | ICD-10-CM

## 2021-09-29 DIAGNOSIS — M79606 Pain in leg, unspecified: Secondary | ICD-10-CM | POA: Diagnosis not present

## 2021-09-29 DIAGNOSIS — Z880 Allergy status to penicillin: Secondary | ICD-10-CM | POA: Insufficient documentation

## 2021-09-29 DIAGNOSIS — M549 Dorsalgia, unspecified: Secondary | ICD-10-CM | POA: Diagnosis not present

## 2021-09-29 DIAGNOSIS — R109 Unspecified abdominal pain: Secondary | ICD-10-CM | POA: Diagnosis not present

## 2021-09-29 DIAGNOSIS — I82412 Acute embolism and thrombosis of left femoral vein: Secondary | ICD-10-CM | POA: Diagnosis not present

## 2021-09-29 LAB — CEA (IN HOUSE-CHCC): CEA (CHCC-In House): <1 ng/mL (ref 0.00–5.00)

## 2021-09-29 LAB — CMP (CANCER CENTER ONLY)
ALT: 14 U/L (ref 0–44)
AST: 20 U/L (ref 15–41)
Albumin: 4 g/dL (ref 3.5–5.0)
Alkaline Phosphatase: 116 U/L (ref 38–126)
Anion gap: 8 (ref 5–15)
BUN: 16 mg/dL (ref 6–20)
CO2: 30 mmol/L (ref 22–32)
Calcium: 9.8 mg/dL (ref 8.9–10.3)
Chloride: 104 mmol/L (ref 98–111)
Creatinine: 0.89 mg/dL (ref 0.44–1.00)
GFR, Estimated: >60 mL/min (ref >60)
Glucose, Bld: 126 mg/dL — ABNORMAL HIGH (ref 70–99)
Potassium: 3.6 mmol/L (ref 3.5–5.1)
Sodium: 142 mmol/L (ref 135–145)
Total Bilirubin: 0.7 mg/dL (ref 0.3–1.2)
Total Protein: 7.3 g/dL (ref 6.5–8.1)

## 2021-09-29 LAB — CBC WITH DIFFERENTIAL (CANCER CENTER ONLY)
Abs Immature Granulocytes: 0.02 10*3/uL (ref 0.00–0.07)
Basophils Absolute: 0.1 10*3/uL (ref 0.0–0.1)
Basophils Relative: 1 %
Eosinophils Absolute: 0.2 10*3/uL (ref 0.0–0.5)
Eosinophils Relative: 3 %
HCT: 38.2 % (ref 36.0–46.0)
Hemoglobin: 12.4 g/dL (ref 12.0–15.0)
Immature Granulocytes: 0 %
Lymphocytes Relative: 44 %
Lymphs Abs: 3.1 10*3/uL (ref 0.7–4.0)
MCH: 30.7 pg (ref 26.0–34.0)
MCHC: 32.5 g/dL (ref 30.0–36.0)
MCV: 94.6 fL (ref 80.0–100.0)
Monocytes Absolute: 0.6 10*3/uL (ref 0.1–1.0)
Monocytes Relative: 9 %
Neutro Abs: 3 10*3/uL (ref 1.7–7.7)
Neutrophils Relative %: 43 %
Platelet Count: 280 10*3/uL (ref 150–400)
RBC: 4.04 MIL/uL (ref 3.87–5.11)
RDW: 13.8 % (ref 11.5–15.5)
WBC Count: 6.9 10*3/uL (ref 4.0–10.5)
nRBC: 0 % (ref 0.0–0.2)

## 2021-09-29 LAB — IRON AND TIBC
Iron: 83 ug/dL (ref 41–142)
Saturation Ratios: 23 % (ref 21–57)
TIBC: 366 ug/dL (ref 236–444)
UIBC: 283 ug/dL (ref 120–384)

## 2021-09-29 LAB — VITAMIN B12: Vitamin B-12: 323 pg/mL (ref 180–914)

## 2021-09-29 LAB — LACTATE DEHYDROGENASE: LDH: 183 U/L (ref 98–192)

## 2021-09-29 LAB — FERRITIN: Ferritin: 57 ng/mL (ref 11–307)

## 2021-09-29 MED ORDER — IOHEXOL 300 MG/ML  SOLN
100.0000 mL | Freq: Once | INTRAMUSCULAR | Status: AC | PRN
  Administered 2021-09-29: 100 mL via INTRAVENOUS

## 2021-09-29 MED ORDER — RIVAROXABAN 20 MG PO TABS: 20.0000 mg | ORAL_TABLET | Freq: Every day | ORAL | 4 refills | Status: DC

## 2021-09-29 MED ORDER — DIPHENOXYLATE-ATROPINE 2.5-0.025 MG PO TABS: 2.0000 | ORAL_TABLET | Freq: Four times a day (QID) | ORAL | 0 refills | Status: DC

## 2021-09-29 MED ORDER — ORPHENADRINE CITRATE ER 100 MG PO TB12: 100.0000 mg | ORAL_TABLET | Freq: Every evening | ORAL | 2 refills | Status: DC | PRN

## 2021-09-29 NOTE — Progress Notes (Signed)
Hematology and Oncology Follow Up Visit  Wendy Barber [id number] December 07, 1960 60 y.o. 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 daily Vitamin B12 1000 mcg IM monthly     Interim History:  Wendy Barber is back for follow-up.  Wendy Barber is very well.  Unfortunately, her blood pressure was quite high.  Wendy Barber is only taking propranolol.  Wendy Barber had been on valsartan/HCTZ.  Wendy Barber has some at home.  I told her to start taking that again.  This may help with the headaches.  Wendy Barber will follow-up with her family doctor.  Wendy Barber is having leg pain at night.  I am not sure what is causing this.  I think that muscle relaxer can help her.  As such, I called in some Norflex (100 mg p.o. nightly as needed).    Wendy Barber did have a CT scan done today.  There was no evidence of recurrent cancer.  Wendy Barber does have some evidence of cirrhosis which I am sure is chronic.  Wendy Barber still has the diarrhea issues.  I think Wendy Barber will always have this.  I am sure that Wendy Barber had short gut from her past surgeries.  Her last ulcer of the right leg back in July did not show any evidence of a blood clot.  Wendy Barber is on Xarelto 20 mg a day.  I would just keep her on the 20 mg for right now.  Wendy Barber takes vitamin B12 at home.  Wendy Barber takes it monthly as an injection.  Wendy Barber did have a nice Thanksgiving.  Wendy Barber and her husband went down to his family's house and [place].  Wendy Barber has had no fever.  Wendy Barber has had no cough or shortness of breath.  Currently, her performance status is ECOG 1.   Medications:  Current Outpatient Medications:    BD INTEGRA SYRINGE 25G X 1" 3 ML MISC, Inject into the muscle daily., Disp: , Rfl:    busPIRone (BUSPAR) 15 MG tablet, Take 1 tablet (15 mg total) by mouth 2 (two) times daily., Disp: 60 tablet, Rfl: 2    citalopram (CELEXA) 20 MG tablet, Take 1 tablet (20 mg total) by mouth daily., Disp: 90 tablet, Rfl: 2   cyanocobalamin (,VITAMIN B-12,) 1000 MCG/ML injection, Take 1000 mcg IM every day x 5 days, then once weekly x 4 weeks, then once a month thereafter., Disp: 12 mL, Rfl: 2   diphenoxylate-atropine (LOMOTIL) 2.5-0.025 MG tablet, Take 2 tablets by mouth in the morning, at noon, in the evening, and at bedtime., Disp: , Rfl:    eszopiclone (LUNESTA) 2 MG TABS tablet, Take 1 tablet (2 mg total) by mouth at bedtime as needed for sleep. Take immediately before bedtime, Disp: 30 tablet, Rfl: 2   famotidine (PEPCID) 20 MG tablet, Take 2 tablets (40 mg total) by mouth 2 (two) times daily., Disp: 360 tablet, Rfl: 3   HYDROmorphone (DILAUDID) 2 MG tablet, Take 1 tablet (2 mg total) by mouth every 6 (six) hours as needed for severe pain., Disp: 60 tablet, Rfl: 0   loperamide (IMODIUM) 2 MG capsule, Take 4 mg by mouth in the morning, at noon, in the evening, and at bedtime., Disp: , Rfl:    Melatonin 1 MG CHEW, Chew 5 mg by mouth., Disp: , Rfl:    NEEDLE, DISP, 25 G (EASY  TOUCH FLIPLOCK NEEDLES) 25G X 1" MISC, Inject 1 each into the muscle daily., Disp: 12 each, Rfl: 2   ondansetron (ZOFRAN) 8 MG tablet, Take 8 mg by mouth every 8 (eight) hours as needed., Disp: , Rfl:    pantoprazole (PROTONIX) 40 MG tablet, Take 40 mg by mouth every morning., Disp: , Rfl:    promethazine (PHENERGAN) 12.5 MG tablet, Take 12.5 mg by mouth 2 (two) times daily as needed., Disp: , Rfl:    propranolol (INDERAL) 20 MG tablet, Take 1 tablet (20 mg total) by mouth 2 (two) times daily., Disp: 180 tablet, Rfl: 1   rivaroxaban (XARELTO) 20 MG TABS tablet, Take 1 tablet (20 mg total) by mouth daily with supper., Disp: 30 tablet, Rfl: 4   XIFAXAN 550 MG TABS tablet, Take 550 mg by mouth 3 (three) times daily., Disp: , Rfl:   Allergies:  Allergies  Allergen Reactions   Penicillins Shortness Of Breath    Other reaction(s): Irregular Heart  Rate, Other (See Comments) Rapid heartrate    Alprazolam Hives and Other (See Comments)    Note: tolerates midazolam fine unresponsive Hard to arouse unresponsive Hard to arouse  Other reaction(s): Other (See Comments) unresponsive   Ativan [Lorazepam] Other (See Comments)    Note: tolerates midazolam fine Face & Throat Swelling.   Corticosteroids Other (See Comments)    Other reaction(s): Other (see comments) Psychotic behaviour     Erythromycin     Other reaction(s): Other (See Comments) Severe stomach pain    Erythromycin Base Hives   Milnacipran Hcl Other (See Comments)    Other reaction(s):  psychotic episode      Prednisone Other (See Comments)    Anxiety & Nervous Breakdown.   Savella  [Milnacipran]    Povidone Iodine Rash    Had rash under bandage after surgery , has had betadine since without reaction    Prednisolone Anxiety    Past Medical History, Surgical history, Social history, and Family History were reviewed and updated.  Review of Systems: Review of Systems  Constitutional:  Positive for fatigue and unexpected weight change.  HENT:  Negative.    Eyes: Negative.   Respiratory: Negative.    Cardiovascular: Negative.   Gastrointestinal:  Positive for abdominal pain and diarrhea.  Endocrine: Negative.   Genitourinary:  Positive for dysuria.   Musculoskeletal:  Positive for back pain.  Skin: Negative.   Neurological: Negative.   Hematological: Negative.   Psychiatric/Behavioral:  Positive for depression.    Physical Exam:  vitals were not taken for this visit.   Wt Readings from Last 3 Encounters:  07/27/21 212 lb (96.2 kg)  05/26/21 213 lb 3.2 oz (96.7 kg)  05/13/21 212 lb (96.2 kg)    Physical Exam Vitals reviewed.  HENT:     Head: Normocephalic and atraumatic.  Eyes:     Pupils: Pupils are equal, round, and reactive to light.  Cardiovascular:     Rate and Rhythm: Normal rate and regular rhythm.     Heart sounds: Normal heart  sounds.  Pulmonary:     Effort: Pulmonary effort is normal.     Breath sounds: Normal breath sounds.  Abdominal:     General: Bowel sounds are normal.     Palpations: Abdomen is soft.     Comments: Abdominal exam shows healed laparotomy scar.  Wendy Barber has no fluid wave.  There is no guarding or rebound tenderness.  Wendy Barber has decent bowel sounds.  There is no palpable liver or spleen  tip.  Musculoskeletal:        General: No tenderness or deformity. Normal range of motion.     Cervical back: Normal range of motion.  Lymphadenopathy:     Cervical: No cervical adenopathy.  Skin:    General: Skin is warm and dry.     Findings: No erythema or rash.  Neurological:     Mental Status: Wendy Barber is alert and oriented to person, place, and time.  Psychiatric:        Behavior: Behavior normal.        Thought Content: Thought content normal.        Judgment: Judgment normal.     Lab Results  Component Value Date   WBC 6.9 09/29/2021   HGB 12.4 09/29/2021   HCT 38.2 09/29/2021   MCV 94.6 09/29/2021   PLT 280 09/29/2021     Chemistry      Component Value Date/Time   NA 142 09/29/2021 0857   K 3.6 09/29/2021 0857   CL 104 09/29/2021 0857   CO2 30 09/29/2021 0857   BUN 16 09/29/2021 0857   CREATININE 0.89 09/29/2021 0857      Component Value Date/Time   CALCIUM 9.8 09/29/2021 0857   ALKPHOS 116 09/29/2021 0857   AST 20 09/29/2021 0857   ALT 14 09/29/2021 0857   BILITOT 0.7 09/29/2021 0857      Impression and Plan: Wendy Barber is a very charming 60 year-old white female.  Wendy Barber has incredible history.  Wendy Barber now has had a recurrence of her appendiceal cancer.  I am just absolutely amazed by this.  It has been about 15 years or so since Wendy Barber had her initial appendiceal cancer removed.  Wendy Barber will back up to [facility] for another HIPEC procedure.  Wendy Barber had a lot of issues afterwards.  Wendy Barber was hospitalized here with a lot of pain and diarrhea and nonhealing.  Wendy Barber eventually did improve.  I am glad  that the CT scan looks good.  I do not think we have to do another scan on her for probably 6 months.  I would like to see her back in about 3 months.  We may want to think about decreasing the dose of Xarelto at that point down to 10 mg a day.  I am just happy that her quality of life is doing a little bit better.  I just wish that the diarrhea would get better but I just do not think this will.  Maybe, the Norflex will help with the leg pain at nighttime.   Volanda Napoleon, MD 12/7/202211:40 AM

## 2021-09-29 NOTE — Telephone Encounter (Signed)
Pt. In the office today requesting refills on Lomotil and Xarelto at [place].

## 2021-09-30 ENCOUNTER — Encounter: Payer: Self-pay | Admitting: *Deleted

## 2021-09-30 LAB — CHROMOGRANIN A: Chromogranin A (ng/mL): 88.9 ng/mL (ref 0.0–101.8)

## 2021-10-10 ENCOUNTER — Encounter: Payer: Self-pay | Admitting: Hematology & Oncology

## 2021-10-11 ENCOUNTER — Other Ambulatory Visit: Payer: Self-pay | Admitting: *Deleted

## 2021-10-11 MED ORDER — DIPHENOXYLATE-ATROPINE 2.5-0.025 MG PO TABS: 2.0000 | ORAL_TABLET | Freq: Four times a day (QID) | ORAL | 3 refills | Status: DC

## 2021-11-04 ENCOUNTER — Telehealth: Payer: Self-pay | Admitting: Behavioral Health

## 2021-11-04 NOTE — Telephone Encounter (Signed)
Spoke to pt.She wants lunesta to stay at [facility] until next month

## 2021-11-04 NOTE — Telephone Encounter (Signed)
Need Rx sent to [facility]. Contact pharmacy @ [phone] about Rx needed.

## 2021-11-22 ENCOUNTER — Ambulatory Visit (INDEPENDENT_AMBULATORY_CARE_PROVIDER_SITE_OTHER): Admitting: Behavioral Health

## 2021-11-22 ENCOUNTER — Encounter: Payer: Self-pay | Admitting: Behavioral Health

## 2021-11-22 ENCOUNTER — Other Ambulatory Visit: Payer: Self-pay

## 2021-11-22 DIAGNOSIS — F331 Major depressive disorder, recurrent, moderate: Secondary | ICD-10-CM

## 2021-11-22 DIAGNOSIS — F5105 Insomnia due to other mental disorder: Secondary | ICD-10-CM | POA: Diagnosis not present

## 2021-11-22 DIAGNOSIS — F418 Other specified anxiety disorders: Secondary | ICD-10-CM

## 2021-11-22 DIAGNOSIS — F411 Generalized anxiety disorder: Secondary | ICD-10-CM

## 2021-11-22 DIAGNOSIS — F99 Mental disorder, not otherwise specified: Secondary | ICD-10-CM

## 2021-11-22 MED ORDER — ESZOPICLONE 2 MG PO TABS: 2.0000 mg | ORAL_TABLET | Freq: Every evening | ORAL | 3 refills | Status: DC | PRN

## 2021-11-22 MED ORDER — CITALOPRAM HYDROBROMIDE 20 MG PO TABS: 20.0000 mg | ORAL_TABLET | Freq: Every day | ORAL | 2 refills | Status: DC

## 2021-11-22 MED ORDER — BUSPIRONE HCL 15 MG PO TABS: 15.0000 mg | ORAL_TABLET | Freq: Three times a day (TID) | ORAL | 1 refills | Status: DC

## 2021-11-22 NOTE — Progress Notes (Signed)
[facility] Med Check  Patient ID: Wendy Barber,  MRN: [id number]  PCP: [name], MD  Date of Evaluation: 11/22/2021 Time spent:30 minutes  Chief Complaint:  Chief Complaint   Anxiety; Depression; Follow-up; Medication Refill     HISTORY/CURRENT STATUS: HPI  61 year old female presents to this office for follow up and medication management. She says that she "has been holding her own" with anxiety and depression but has good and bad days. Buspar is working very well for anxiety. Reports her anxiety today at 2/10 and depression at 2/10. She is sleeping 7 hours per night with Lunesta and is happy with the med except for metallic taste in her mouth the next day. She still wants to continue. Her chronic medical issues continue to be source of depression. She understands that having short bowel syndrome is lifetime struggle. Her family remains highly supportive. She denies any mania, no psychosis, No current SI or HI.    Past psychiatric medication trials: Prozac- night terror Wellbutrin Cymbalta Lexapro       Individual Medical History/ Review of Systems: Changes? :No   Allergies: Penicillins, Alprazolam, Ativan [lorazepam], Corticosteroids, Erythromycin, Erythromycin base, Milnacipran hcl, Prednisone, Savella  [milnacipran], Povidone iodine, and Prednisolone  Current Medications:  Current Outpatient Medications:    BD INTEGRA SYRINGE 25G X 1" 3 ML MISC, Inject into the muscle daily., Disp: , Rfl:    busPIRone (BUSPAR) 15 MG tablet, Take 1 tablet (15 mg total) by mouth 3 (three) times daily., Disp: 270 tablet, Rfl: 1   citalopram (CELEXA) 20 MG tablet, Take 1 tablet (20 mg total) by mouth daily., Disp: 90 tablet, Rfl: 2   cyanocobalamin (,VITAMIN B-12,) 1000 MCG/ML injection, Take 1000 mcg IM every day x 5 days, then once weekly x 4 weeks, then once a month thereafter., Disp: 12 mL, Rfl: 2   diphenoxylate-atropine (LOMOTIL) 2.5-0.025 MG tablet, Take 2 tablets by mouth  in the morning, at noon, in the evening, and at bedtime., Disp: 240 tablet, Rfl: 3   eszopiclone (LUNESTA) 2 MG TABS tablet, Take 1 tablet (2 mg total) by mouth at bedtime as needed for sleep. Take immediately before bedtime, Disp: 30 tablet, Rfl: 3   famotidine (PEPCID) 20 MG tablet, Take 2 tablets (40 mg total) by mouth 2 (two) times daily., Disp: 360 tablet, Rfl: 3   HYDROmorphone (DILAUDID) 2 MG tablet, Take 1 tablet (2 mg total) by mouth every 6 (six) hours as needed for severe pain., Disp: 60 tablet, Rfl: 0   loperamide (IMODIUM) 2 MG capsule, Take 4 mg by mouth in the morning, at noon, in the evening, and at bedtime., Disp: , Rfl:    Melatonin 1 MG CHEW, Chew 5 mg by mouth., Disp: , Rfl:    NEEDLE, DISP, 25 G (EASY TOUCH FLIPLOCK NEEDLES) 25G X 1" MISC, Inject 1 each into the muscle daily., Disp: 12 each, Rfl: 2   ondansetron (ZOFRAN) 8 MG tablet, Take 8 mg by mouth every 8 (eight) hours as needed., Disp: , Rfl:    orphenadrine (NORFLEX) 100 MG tablet, Take 1 tablet (100 mg total) by mouth at bedtime as needed for muscle spasms., Disp: 30 tablet, Rfl: 2   pantoprazole (PROTONIX) 40 MG tablet, Take 40 mg by mouth every morning., Disp: , Rfl:    promethazine (PHENERGAN) 12.5 MG tablet, Take 12.5 mg by mouth 2 (two) times daily as needed., Disp: , Rfl:    propranolol (INDERAL) 20 MG tablet, Take 1 tablet (20 mg total) by  mouth 2 (two) times daily., Disp: 180 tablet, Rfl: 1   rivaroxaban (XARELTO) 20 MG TABS tablet, Take 1 tablet (20 mg total) by mouth daily with supper., Disp: 30 tablet, Rfl: 4   XIFAXAN 550 MG TABS tablet, Take 550 mg by mouth 3 (three) times daily., Disp: , Rfl:  Medication Side Effects: none  Family Medical/ Social History: Changes? No  MENTAL HEALTH EXAM:  There were no vitals taken for this visit.There is no height or weight on file to calculate BMI.  General Appearance: Casual and Neat  Eye Contact:  Good  Speech:  Clear and Coherent  Volume:  Normal  Mood:  NA   Affect:  Appropriate  Thought Process:  Coherent  Orientation:  Full (Time, Place, and Person)  Thought Content: Logical   Suicidal Thoughts:  No  Homicidal Thoughts:  No  Memory:  WNL  Judgement:  Good  Insight:  Good  Psychomotor Activity:  Normal  Concentration:  Concentration: Good  Recall:  Good  Fund of Knowledge: Good  Language: Good  Assets:  Desire for Improvement Social Support  ADL's:  Intact  Cognition: WNL  Prognosis:  Good    DIAGNOSES:    ICD-10-CM   1. Generalized anxiety disorder  F41.1 busPIRone (BUSPAR) 15 MG tablet    2. Major depressive disorder, recurrent episode, moderate (HCC)  F33.1 busPIRone (BUSPAR) 15 MG tablet    3. Depression with anxiety  F41.8 citalopram (CELEXA) 20 MG tablet    4. Insomnia due to other mental disorder  F51.05 eszopiclone (LUNESTA) 2 MG TABS tablet   F99       Receiving Psychotherapy: No    RECOMMENDATIONS:   Greater than 50% of 30  min face to face time with patient was spent on counseling and coordination of care. We discussed her current level of stability with anxiety and depression as well as extensive chronic illness related to cancer x 2.  We discussed her strong family support and current progress.Her husband is now home for good. They have purchased camper and doing some camping which she enjoys. The are going on trip to [place] in April.  She is happy about this. Discussed her goals, challenges and expectations, and continued treatment.plan.  We agreed to continue:   Will increase Buspar  to 15 mg to  three times daily as needed. To continue Celexa to 20 mg daily. Will take with food to help with nausea. Could make diarrhea worse in the beginning but may pass after a week or two.  To Continue Lunesta 5 mg at betime. Belsomra was not approved by insurance. She is happy with Johnnye Sima but says causes metallic taste in mouth next day.  Will report worsening symptoms or side effects promptly To follow up in 3 months  to reassess Will continue to follow up with PCP regularly Provided emergency contact information Reviewed [place], NP

## 2021-11-25 ENCOUNTER — Ambulatory Visit: Admitting: Registered Nurse

## 2021-11-29 ENCOUNTER — Other Ambulatory Visit: Payer: Self-pay

## 2021-11-29 ENCOUNTER — Ambulatory Visit (INDEPENDENT_AMBULATORY_CARE_PROVIDER_SITE_OTHER): Admitting: Registered Nurse

## 2021-11-29 ENCOUNTER — Encounter: Payer: Self-pay | Admitting: Registered Nurse

## 2021-11-29 VITALS — BP 140/85 | HR 67 | Temp 98.3°F | Resp 18 | Ht 66.0 in | Wt 217.2 lb

## 2021-11-29 DIAGNOSIS — D329 Benign neoplasm of meninges, unspecified: Secondary | ICD-10-CM

## 2021-11-29 DIAGNOSIS — K146 Glossodynia: Secondary | ICD-10-CM | POA: Diagnosis not present

## 2021-11-29 NOTE — Addendum Note (Signed)
Addended by: Maximiano Coss on: 11/29/2021 03:15 PM   Modules accepted: Orders

## 2021-11-29 NOTE — Patient Instructions (Addendum)
[name] to meet you!  Let's get MRI head to rule out changes to the meningioma.  I will let you know how labs look  We can continue to work on this from there  Thank you  Rich     If you have lab work done today you will be contacted with your lab results within the next 2 weeks.  If you have not heard from [place] then please contact us. The fastest way to get your results is to register for My Chart.   IF you received an x-ray today, you will receive an invoice from [facility]. Please contact [facility] at [phone] with questions or concerns regarding your invoice.   IF you received labwork today, you will receive an invoice from [facility]. Please contact [facility] at [phone] with questions or concerns regarding your invoice.   Our billing staff will not be able to assist you with questions regarding bills from these companies.  You will be contacted with the lab results as soon as they are available. The fastest way to get your results is to activate your My Chart account. Instructions are located on the last page of this paperwork. If you have not heard from [place] regarding the results in 2 weeks, please contact this office.

## 2021-11-29 NOTE — Progress Notes (Addendum)
Established Patient Office Visit  Subjective:  Patient ID: Wendy Barber, female    DOB: 1960-11-29  Age: 61 y.o. MRN: [id number]  CC:  Chief Complaint  Patient presents with   Numbness    Patient states for the last 5 months her tongue and inner lower lip. Patient states it hurts to eat unless its soft foods.    HPI [name] presents for numbness and pain  Tongue and inner lower lip Painful to eat anything but soft foods.  Feels like tongue has been burnt in terms of numbness  Did have a tongue lesion around 6 years ago that was concerning for oral ca, but biopsy negative  No white patches on tongue.   More headaches recently. 1.8cm meningioma had been stable on imaging last imaging readily available is MRI brain 04/06/20. Does note feeling off balance and some dermal sensory changes - stinging sensation at times.   Does have hx of autoimmune processes.  Cannot eat chips, sensitive to temperature of food, spicy foods.  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   Pernicious anemia 05/14/2021   Presence of IVC filter 03/19/2020   Pulmonary embolism, bilateral ([place]) 03/19/2020    Past Surgical History:  Procedure Laterality Date   ABDOMINAL HYSTERECTOMY     ABDOMINAL SURGERY     ACHILLES TENDON REPAIR     APPENDECTOMY     ARTHROPLASTY     CARPAL TUNNEL RELEASE     CHOLECYSTECTOMY     IR CATHETER TUBE CHANGE  02/02/2021   IR CATHETER TUBE CHANGE  02/25/2021   IR RADIOLOGIST EVAL & MGMT  02/24/2021   IR  RADIOLOGIST EVAL & MGMT  03/10/2021   IR US GUIDE BX ASP/DRAIN  11/25/2019   JOINT REPLACEMENT     KNEE ARTHROSCOPY     ORIF ANKLE FRACTURE Right 08/27/2019   Procedure: OPEN REDUCTION INTERNAL FIXATION RIGHT ANKLE FRACTURE;  Surgeon: Renette Butters, MD;  Location: [facility];  Service: Orthopedics;  Laterality: Right;   perineorrophy     TONGUE BIOPSY      Family History  Problem Relation Age of Onset   Depression Mother    Drug abuse Sister    Suicidality Sister    Alcohol abuse Brother    Drug abuse Brother     Social History   Socioeconomic History   Marital status: Married    Spouse name: Not on file   Number of children: 3   Years of education: Not on file   Highest education level: Some college, no degree  Occupational History   Occupation: disability  Tobacco Use   Smoking status: Never   Smokeless tobacco: Never  Vaping Use   Vaping Use: Never used  Substance and Sexual Activity   Alcohol use: No   Drug use: Never   Sexual activity: Yes    Birth control/protection: None    Comment: hysterectomy  Other Topics Concern   Not on file  Social History Narrative   Right  handed   One story home   Drinks occasional caffeine   Social Determinants of Health   Financial Resource Strain: Not on file  Food Insecurity: Not on file  Transportation Needs: Not on file  Physical Activity: Not on file  Stress: Not on file  Social Connections: Not on file  Intimate Partner Violence: Not on file    Outpatient Medications Prior to Visit  Medication Sig Dispense Refill   BD INTEGRA SYRINGE 25G X 1" 3 ML MISC Inject into the muscle daily.     busPIRone (BUSPAR) 15 MG tablet Take 1 tablet (15 mg total) by mouth 3 (three) times daily. 270 tablet 1   citalopram (CELEXA) 20 MG tablet Take 1 tablet (20 mg total) by mouth daily. 90 tablet 2   cyanocobalamin (,VITAMIN B-12,) 1000 MCG/ML injection Take 1000 mcg IM every day x 5 days, then once weekly x 4 weeks, then once a month  thereafter. 12 mL 2   diphenoxylate-atropine (LOMOTIL) 2.5-0.025 MG tablet Take 2 tablets by mouth in the morning, at noon, in the evening, and at bedtime. 240 tablet 3   eszopiclone (LUNESTA) 2 MG TABS tablet Take 1 tablet (2 mg total) by mouth at bedtime as needed for sleep. Take immediately before bedtime 30 tablet 3   famotidine (PEPCID) 20 MG tablet Take 2 tablets (40 mg total) by mouth 2 (two) times daily. 360 tablet 3   HYDROmorphone (DILAUDID) 2 MG tablet Take 1 tablet (2 mg total) by mouth every 6 (six) hours as needed for severe pain. 60 tablet 0   loperamide (IMODIUM) 2 MG capsule Take 4 mg by mouth in the morning, at noon, in the evening, and at bedtime.     Melatonin 1 MG CHEW Chew 5 mg by mouth.     NEEDLE, DISP, 25 G (EASY TOUCH FLIPLOCK NEEDLES) 25G X 1" MISC Inject 1 each into the muscle daily. 12 each 2   ondansetron (ZOFRAN) 8 MG tablet Take 8 mg by mouth every 8 (eight) hours as needed.     orphenadrine (NORFLEX) 100 MG tablet Take 1 tablet (100 mg total) by mouth at bedtime as needed for muscle spasms. 30 tablet 2   pantoprazole (PROTONIX) 40 MG tablet Take 40 mg by mouth every morning.     promethazine (PHENERGAN) 12.5 MG tablet Take 12.5 mg by mouth 2 (two) times daily as needed.     propranolol (INDERAL) 20 MG tablet Take 1 tablet (20 mg total) by mouth 2 (two) times daily. 180 tablet 1   rivaroxaban (XARELTO) 20 MG TABS tablet Take 1 tablet (20 mg total) by mouth daily with supper. 30 tablet 4   No facility-administered medications prior to visit.    Allergies  Allergen Reactions   Penicillins Shortness Of Breath    Other reaction(s): Irregular Heart Rate, Other (See Comments) Rapid heartrate    Alprazolam Hives and Other (See Comments)    Note: tolerates midazolam fine unresponsive Hard to arouse unresponsive Hard to arouse  Other reaction(s): Other (See Comments) unresponsive   Ativan [Lorazepam] Other (See Comments)    Note: tolerates midazolam fine Face  & Throat Swelling.   Corticosteroids Other (See Comments)    Other reaction(s): Other (see comments) Psychotic behaviour     Erythromycin     Other reaction(s): Other (See Comments) Severe stomach pain    Erythromycin Base Hives   Milnacipran Hcl Other (See Comments)    Other reaction(s):  psychotic episode      Prednisone  Other (See Comments)    Anxiety & Nervous Breakdown.   Savella  [Milnacipran]    Povidone Iodine Rash    Had rash under bandage after surgery , has had betadine since without reaction    Prednisolone Anxiety    ROS Review of Systems  Constitutional: Negative.   HENT: Negative.    Eyes: Negative.   Respiratory: Negative.    Cardiovascular: Negative.   Gastrointestinal: Negative.   Genitourinary: Negative.   Musculoskeletal: Negative.   Skin: Negative.   Neurological: Negative.   Psychiatric/Behavioral: Negative.    All other systems reviewed and are negative.    Objective:    Physical Exam Vitals and nursing note reviewed.  Constitutional:      General: She is not in acute distress.    Appearance: Normal appearance. She is normal weight. She is not ill-appearing, toxic-appearing or diaphoretic.  HENT:     Mouth/Throat:     Mouth: Mucous membranes are moist.     Pharynx: Oropharynx is clear. No oropharyngeal exudate or posterior oropharyngeal erythema.     Comments: No lingual or other oral lesions noted. Cardiovascular:     Rate and Rhythm: Normal rate and regular rhythm.     Heart sounds: Normal heart sounds. No murmur heard.   No friction rub. No gallop.  Pulmonary:     Effort: Pulmonary effort is normal. No respiratory distress.     Breath sounds: Normal breath sounds. No stridor. No wheezing, rhonchi or rales.  Chest:     Chest wall: No tenderness.  Skin:    General: Skin is warm and dry.  Neurological:     General: No focal deficit present.     Mental Status: She is alert and oriented to person, place, and time. Mental status is  at baseline.  Psychiatric:        Mood and Affect: Mood normal.        Behavior: Behavior normal.        Thought Content: Thought content normal.        Judgment: Judgment normal.    BP 140/85    Pulse 67    Temp 98.3 F (36.8 C) (Temporal)    Resp 18    Ht 5\' 6"  (1.676 m)    Wt 217 lb 3.2 oz (98.5 kg)    SpO2 98%    BMI 35.06 kg/m  Wt Readings from Last 3 Encounters:  11/29/21 217 lb 3.2 oz (98.5 kg)  07/27/21 212 lb (96.2 kg)  05/26/21 213 lb 3.2 oz (96.7 kg)     Health Maintenance Due  Topic Date Due   URINE MICROALBUMIN  Never done   Zoster Vaccines- Shingrix (1 of 2) Never done   PAP SMEAR-Modifier  Never done   COVID-19 Vaccine (4 - Booster for Pfizer series) 09/12/2020   INFLUENZA VACCINE  05/24/2021   HEMOGLOBIN A1C  07/19/2021    There are no preventive care reminders to display for this patient.  Lab Results  Component Value Date   TSH 2.248 07/27/2021   Lab Results  Component Value Date   WBC 6.9 09/29/2021   HGB 12.4 09/29/2021   HCT 38.2 09/29/2021   MCV 94.6 09/29/2021   PLT 280 09/29/2021   Lab Results  Component Value Date   NA 142 09/29/2021   K 3.6 09/29/2021   CO2 30 09/29/2021   GLUCOSE 126 (H) 09/29/2021   BUN 16 09/29/2021   CREATININE 0.89 09/29/2021   BILITOT 0.7 09/29/2021   ALKPHOS  116 09/29/2021   AST 20 09/29/2021   ALT 14 09/29/2021   PROT 7.3 09/29/2021   ALBUMIN 4.0 09/29/2021   CALCIUM 9.8 09/29/2021   ANIONGAP 8 09/29/2021   No results found for: CHOL No results found for: HDL No results found for: LDLCALC No results found for: TRIG No results found for: CHOLHDL Lab Results  Component Value Date   HGBA1C 5.8 (H) 01/16/2021      Assessment & Plan:   Problem List Items Addressed This Visit   None Visit Diagnoses     Tongue pain    -  Primary   Relevant Orders   CBC with Differential/Platelet   Comprehensive metabolic panel   Hemoglobin A1c   TSH   Sjogrens syndrome-A extractable nuclear antibody    Sjogrens syndrome-B extractable nuclear antibody   T4, free   Urinalysis, Routine w reflex microscopic   B12 and Folate Panel   Vitamin D (25 hydroxy)   CT HEAD W & WO CONTRAST (5MM)   Meningioma (HCC)       Relevant Orders   CBC with Differential/Platelet   Comprehensive metabolic panel   Hemoglobin A1c   TSH   Sjogrens syndrome-A extractable nuclear antibody   Sjogrens syndrome-B extractable nuclear antibody   T4, free   Urinalysis, Routine w reflex microscopic   B12 and Folate Panel   Vitamin D (25 hydroxy)   CT HEAD W & WO CONTRAST (5MM)       No orders of the defined types were placed in this encounter.   Follow-up: Return if symptoms worsen or fail to improve.   PLAN Unclear etiology. She has a complex history that warrants a fairly broad work up. Will obtain labs and CT brain wwo. Follow up as warranted Advise pt to continue soft foods and avoid any aggravating foods Patient encouraged to call clinic with any questions, comments, or concerns.  Maximiano Coss, NP

## 2021-11-30 LAB — URINALYSIS, ROUTINE W REFLEX MICROSCOPIC
Bilirubin Urine: NEGATIVE
Hgb urine dipstick: NEGATIVE
Nitrite: POSITIVE — AB
Specific Gravity, Urine: 1.025 (ref 1.000–1.030)
Total Protein, Urine: NEGATIVE
Urine Glucose: NEGATIVE
Urobilinogen, UA: 0.2 (ref 0.0–1.0)
pH: 6 (ref 5.0–8.0)

## 2021-11-30 LAB — TSH: TSH: 1.61 u[IU]/mL (ref 0.35–5.50)

## 2021-11-30 LAB — CBC WITH DIFFERENTIAL/PLATELET
Basophils Absolute: 0.1 10*3/uL (ref 0.0–0.1)
Basophils Relative: 1.1 % (ref 0.0–3.0)
Eosinophils Absolute: 0.2 10*3/uL (ref 0.0–0.7)
Eosinophils Relative: 2.2 % (ref 0.0–5.0)
HCT: 39.3 % (ref 36.0–46.0)
Hemoglobin: 12.9 g/dL (ref 12.0–15.0)
Lymphocytes Relative: 35.9 % (ref 12.0–46.0)
Lymphs Abs: 2.7 10*3/uL (ref 0.7–4.0)
MCHC: 32.7 g/dL (ref 30.0–36.0)
MCV: 92 fl (ref 78.0–100.0)
Monocytes Absolute: 0.6 10*3/uL (ref 0.1–1.0)
Monocytes Relative: 8.7 % (ref 3.0–12.0)
Neutro Abs: 3.9 10*3/uL (ref 1.4–7.7)
Neutrophils Relative %: 52.1 % (ref 43.0–77.0)
Platelets: 259 10*3/uL (ref 150.0–400.0)
RBC: 4.28 Mil/uL (ref 3.87–5.11)
RDW: 13.9 % (ref 11.5–15.5)
WBC: 7.4 10*3/uL (ref 4.0–10.5)

## 2021-11-30 LAB — HEMOGLOBIN A1C: Hgb A1c MFr Bld: 6.8 % — ABNORMAL HIGH (ref 4.6–6.5)

## 2021-11-30 LAB — COMPREHENSIVE METABOLIC PANEL
ALT: 16 U/L (ref 0–35)
AST: 20 U/L (ref 0–37)
Albumin: 4.2 g/dL (ref 3.5–5.2)
Alkaline Phosphatase: 128 U/L — ABNORMAL HIGH (ref 39–117)
BUN: 20 mg/dL (ref 6–23)
CO2: 34 mEq/L — ABNORMAL HIGH (ref 19–32)
Calcium: 9.5 mg/dL (ref 8.4–10.5)
Chloride: 100 mEq/L (ref 96–112)
Creatinine, Ser: 0.98 mg/dL (ref 0.40–1.20)
GFR: 63.37 mL/min (ref >60.00)
Glucose, Bld: 116 mg/dL — ABNORMAL HIGH (ref 70–99)
Potassium: 3.7 mEq/L (ref 3.5–5.1)
Sodium: 140 mEq/L (ref 135–145)
Total Bilirubin: 0.7 mg/dL (ref 0.2–1.2)
Total Protein: 7.4 g/dL (ref 6.0–8.3)

## 2021-11-30 LAB — SJOGRENS SYNDROME-B EXTRACTABLE NUCLEAR ANTIBODY: SSB (La) (ENA) Antibody, IgG: <1 AI

## 2021-11-30 LAB — B12 AND FOLATE PANEL
Folate: 14.6 ng/mL (ref >5.9)
Vitamin B-12: 294 pg/mL (ref 211–911)

## 2021-11-30 LAB — SJOGRENS SYNDROME-A EXTRACTABLE NUCLEAR ANTIBODY: SSA (Ro) (ENA) Antibody, IgG: <1 AI

## 2021-11-30 LAB — T4, FREE: Free T4: 0.73 ng/dL (ref 0.60–1.60)

## 2021-11-30 LAB — VITAMIN D 25 HYDROXY (VIT D DEFICIENCY, FRACTURES): VITD: 20.29 ng/mL — ABNORMAL LOW (ref 30.00–100.00)

## 2021-12-01 ENCOUNTER — Ambulatory Visit
Admission: RE | Admit: 2021-12-01 | Discharge: 2021-12-01 | Disposition: A | Discharge status: Home / Self Care | Source: Ambulatory Visit | Attending: Registered Nurse | Admitting: Registered Nurse

## 2021-12-01 ENCOUNTER — Ambulatory Visit: Admitting: Family Medicine

## 2021-12-01 ENCOUNTER — Other Ambulatory Visit: Payer: Self-pay | Admitting: Registered Nurse

## 2021-12-01 DIAGNOSIS — D329 Benign neoplasm of meninges, unspecified: Secondary | ICD-10-CM

## 2021-12-01 DIAGNOSIS — K146 Glossodynia: Secondary | ICD-10-CM

## 2021-12-01 DIAGNOSIS — N3000 Acute cystitis without hematuria: Secondary | ICD-10-CM

## 2021-12-01 MED ORDER — NITROFURANTOIN MONOHYD MACRO 100 MG PO CAPS: 100.0000 mg | ORAL_CAPSULE | Freq: Two times a day (BID) | ORAL | 0 refills | Status: AC

## 2021-12-01 MED ORDER — IOPAMIDOL (ISOVUE-300) INJECTION 61%
75.0000 mL | Freq: Once | INTRAVENOUS | Status: AC | PRN
  Administered 2021-12-01: 75 mL via INTRAVENOUS

## 2021-12-03 ENCOUNTER — Encounter: Payer: Self-pay | Admitting: Registered Nurse

## 2021-12-03 NOTE — Telephone Encounter (Signed)
Patient was unsure if PCP was aware of her having C-Diff back in March/April of last year and received nitrofurantoin, macrocrystal-monohydrate, (MACROBID) 100 MG capsule [[id number]]  on 12/01/2021 for a UTI.  Patient would like to know if this medication is still safe.

## 2021-12-21 ENCOUNTER — Other Ambulatory Visit: Payer: Self-pay | Admitting: Behavioral Health

## 2021-12-21 DIAGNOSIS — F411 Generalized anxiety disorder: Secondary | ICD-10-CM

## 2021-12-21 DIAGNOSIS — F331 Major depressive disorder, recurrent, moderate: Secondary | ICD-10-CM

## 2021-12-28 ENCOUNTER — Inpatient Hospital Stay: Payer: Medicare Other

## 2021-12-28 ENCOUNTER — Inpatient Hospital Stay: Payer: Medicare Other | Admitting: Hematology & Oncology

## 2021-12-30 ENCOUNTER — Telehealth: Payer: Self-pay | Admitting: *Deleted

## 2021-12-30 ENCOUNTER — Other Ambulatory Visit: Payer: Self-pay | Admitting: *Deleted

## 2021-12-30 ENCOUNTER — Encounter: Payer: Self-pay | Admitting: Hematology & Oncology

## 2021-12-30 DIAGNOSIS — D510 Vitamin B12 deficiency anemia due to intrinsic factor deficiency: Secondary | ICD-10-CM

## 2021-12-30 DIAGNOSIS — C181 Malignant neoplasm of appendix: Secondary | ICD-10-CM

## 2021-12-30 DIAGNOSIS — C772 Secondary and unspecified malignant neoplasm of intra-abdominal lymph nodes: Secondary | ICD-10-CM

## 2021-12-30 DIAGNOSIS — I82412 Acute embolism and thrombosis of left femoral vein: Secondary | ICD-10-CM

## 2021-12-30 NOTE — Telephone Encounter (Signed)
Per scheduling message Vanessa - called and lvm of upcoming appointments - requested call back to confirm 

## 2022-01-03 ENCOUNTER — Inpatient Hospital Stay: Attending: Hematology & Oncology

## 2022-01-03 ENCOUNTER — Other Ambulatory Visit: Payer: Self-pay

## 2022-01-03 DIAGNOSIS — I82412 Acute embolism and thrombosis of left femoral vein: Secondary | ICD-10-CM | POA: Insufficient documentation

## 2022-01-03 DIAGNOSIS — C772 Secondary and unspecified malignant neoplasm of intra-abdominal lymph nodes: Secondary | ICD-10-CM | POA: Insufficient documentation

## 2022-01-03 DIAGNOSIS — Z79899 Other long term (current) drug therapy: Secondary | ICD-10-CM | POA: Diagnosis not present

## 2022-01-03 DIAGNOSIS — D510 Vitamin B12 deficiency anemia due to intrinsic factor deficiency: Secondary | ICD-10-CM | POA: Insufficient documentation

## 2022-01-03 DIAGNOSIS — C181 Malignant neoplasm of appendix: Secondary | ICD-10-CM | POA: Insufficient documentation

## 2022-01-03 LAB — CMP (CANCER CENTER ONLY)
ALT: 27 U/L (ref 0–44)
AST: 31 U/L (ref 15–41)
Albumin: 4.1 g/dL (ref 3.5–5.0)
Alkaline Phosphatase: 151 U/L — ABNORMAL HIGH (ref 38–126)
Anion gap: 8 (ref 5–15)
BUN: 22 mg/dL — ABNORMAL HIGH (ref 6–20)
CO2: 31 mmol/L (ref 22–32)
Calcium: 10.1 mg/dL (ref 8.9–10.3)
Chloride: 102 mmol/L (ref 98–111)
Creatinine: 1.13 mg/dL — ABNORMAL HIGH (ref 0.44–1.00)
GFR, Estimated: 56 mL/min — ABNORMAL LOW (ref >60)
Glucose, Bld: 160 mg/dL — ABNORMAL HIGH (ref 70–99)
Potassium: 3.7 mmol/L (ref 3.5–5.1)
Sodium: 141 mmol/L (ref 135–145)
Total Bilirubin: 0.7 mg/dL (ref 0.3–1.2)
Total Protein: 7.1 g/dL (ref 6.5–8.1)

## 2022-01-03 LAB — CBC WITH DIFFERENTIAL (CANCER CENTER ONLY)
Abs Immature Granulocytes: 0.02 10*3/uL (ref 0.00–0.07)
Basophils Absolute: 0.1 10*3/uL (ref 0.0–0.1)
Basophils Relative: 1 %
Eosinophils Absolute: 0.2 10*3/uL (ref 0.0–0.5)
Eosinophils Relative: 2 %
HCT: 39 % (ref 36.0–46.0)
Hemoglobin: 12.5 g/dL (ref 12.0–15.0)
Immature Granulocytes: 0 %
Lymphocytes Relative: 39 %
Lymphs Abs: 3.1 10*3/uL (ref 0.7–4.0)
MCH: 30.1 pg (ref 26.0–34.0)
MCHC: 32.1 g/dL (ref 30.0–36.0)
MCV: 94 fL (ref 80.0–100.0)
Monocytes Absolute: 0.7 10*3/uL (ref 0.1–1.0)
Monocytes Relative: 8 %
Neutro Abs: 3.8 10*3/uL (ref 1.7–7.7)
Neutrophils Relative %: 50 %
Platelet Count: 293 10*3/uL (ref 150–400)
RBC: 4.15 MIL/uL (ref 3.87–5.11)
RDW: 13.6 % (ref 11.5–15.5)
WBC Count: 7.9 10*3/uL (ref 4.0–10.5)
nRBC: 0 % (ref 0.0–0.2)

## 2022-01-03 LAB — LACTATE DEHYDROGENASE: LDH: 194 U/L — ABNORMAL HIGH (ref 98–192)

## 2022-01-04 LAB — CEA (IN HOUSE-CHCC): CEA (CHCC-In House): <1 ng/mL (ref 0.00–5.00)

## 2022-01-06 LAB — CHROMOGRANIN A: Chromogranin A (ng/mL): 197.4 ng/mL — ABNORMAL HIGH (ref 0.0–101.8)

## 2022-01-14 ENCOUNTER — Other Ambulatory Visit: Payer: Self-pay | Admitting: Family Medicine

## 2022-01-14 DIAGNOSIS — F418 Other specified anxiety disorders: Secondary | ICD-10-CM

## 2022-01-23 ENCOUNTER — Encounter: Payer: Self-pay | Admitting: Family Medicine

## 2022-02-14 ENCOUNTER — Ambulatory Visit: Payer: Medicare Other | Admitting: Behavioral Health

## 2022-02-15 ENCOUNTER — Ambulatory Visit: Admitting: Hematology & Oncology

## 2022-02-15 ENCOUNTER — Inpatient Hospital Stay: Payer: Medicare Other

## 2022-02-22 ENCOUNTER — Encounter: Payer: Self-pay | Admitting: Behavioral Health

## 2022-02-22 ENCOUNTER — Ambulatory Visit (INDEPENDENT_AMBULATORY_CARE_PROVIDER_SITE_OTHER): Payer: Medicare Other | Admitting: Behavioral Health

## 2022-02-22 ENCOUNTER — Encounter: Payer: Self-pay | Admitting: Hematology & Oncology

## 2022-02-22 DIAGNOSIS — F331 Major depressive disorder, recurrent, moderate: Secondary | ICD-10-CM

## 2022-02-22 DIAGNOSIS — F411 Generalized anxiety disorder: Secondary | ICD-10-CM

## 2022-02-22 MED ORDER — CITALOPRAM HYDROBROMIDE 40 MG PO TABS
40.0000 mg | ORAL_TABLET | Freq: Every day | ORAL | 1 refills | Status: DC
Start: 2022-02-22 — End: 2022-04-05

## 2022-02-22 NOTE — Progress Notes (Signed)
[facility] ? ?Patient ID: Wendy Barber,  ?MRN: [id number] ? ?PCP: [name], MD ? ?Date of Evaluation: 02/22/2022 ?Time spent:30 minutes ? ?Chief Complaint:  ? ?HISTORY/CURRENT STATUS: ?HPI ? ?61 year old female presents to this office for follow up and medication management. She says that she "has been holding her own" with anxiety and depression but has good and bad days. Lately she says her depression is worse but she cannot any different recent Triggers. She and family got back from their [place] vacation two weeks ago and she started to have increase bowel problems from her disease, but she does not know why she feels to bad mentally right now. Buspar is working very well for anxiety. Reports her anxiety today at 2/10 and depression at 6/10. She is sleeping 7 hours per night with Lunesta and is happy with the med except for metallic taste in her mouth the next day. She still wants to continue. She is requesting adjustments to her medication at this time.  She understands that having short bowel syndrome is lifetime struggle. Her family remains highly supportive. She denies any mania, no psychosis, No current SI or HI.  ?  ?Past psychiatric medication trials: ?Prozac- night terror ?Wellbutrin ?Cymbalta ?Lexapro    ?  ? ? ? ?Individual Medical History/ Review of Systems: Changes? :No  ? ?Allergies: Penicillins, Alprazolam, Ativan [lorazepam], Corticosteroids, Erythromycin, Erythromycin base, Milnacipran hcl, Prednisone, Savella  [milnacipran], Povidone iodine, and Prednisolone ? ?Current Medications:  ?Current Outpatient Medications:  ?  BD INTEGRA SYRINGE 25G X 1" 3 ML MISC, Inject into the muscle daily., Disp: , Rfl:  ?  busPIRone (BUSPAR) 15 MG tablet, Take 1 tablet (15 mg total) by mouth 3 (three) times daily., Disp: 270 tablet, Rfl: 1 ?  citalopram (CELEXA) 20 MG tablet, Take 1 tablet (20 mg total) by mouth daily., Disp: 90 tablet, Rfl: 2 ?  cyanocobalamin (,VITAMIN B-12,) 1000 MCG/ML  injection, Take 1000 mcg IM every day x 5 days, then once weekly x 4 weeks, then once a month thereafter., Disp: 12 mL, Rfl: 2 ?  diphenoxylate-atropine (LOMOTIL) 2.5-0.025 MG tablet, Take 2 tablets by mouth in the morning, at noon, in the evening, and at bedtime., Disp: 240 tablet, Rfl: 3 ?  eszopiclone (LUNESTA) 2 MG TABS tablet, Take 1 tablet (2 mg total) by mouth at bedtime as needed for sleep. Take immediately before bedtime, Disp: 30 tablet, Rfl: 3 ?  famotidine (PEPCID) 20 MG tablet, Take 2 tablets (40 mg total) by mouth 2 (two) times daily., Disp: 360 tablet, Rfl: 3 ?  HYDROmorphone (DILAUDID) 2 MG tablet, Take 1 tablet (2 mg total) by mouth every 6 (six) hours as needed for severe pain., Disp: 60 tablet, Rfl: 0 ?  loperamide (IMODIUM) 2 MG capsule, Take 4 mg by mouth in the morning, at noon, in the evening, and at bedtime., Disp: , Rfl:  ?  Melatonin 1 MG CHEW, Chew 5 mg by mouth., Disp: , Rfl:  ?  NEEDLE, DISP, 25 G (EASY TOUCH FLIPLOCK NEEDLES) 25G X 1" MISC, Inject 1 each into the muscle daily., Disp: 12 each, Rfl: 2 ?  ondansetron (ZOFRAN) 8 MG tablet, Take 8 mg by mouth every 8 (eight) hours as needed., Disp: , Rfl:  ?  orphenadrine (NORFLEX) 100 MG tablet, Take 1 tablet (100 mg total) by mouth at bedtime as needed for muscle spasms., Disp: 30 tablet, Rfl: 2 ?  pantoprazole (PROTONIX) 40 MG tablet, Take 40 mg by mouth every  morning., Disp: , Rfl:  ?  promethazine (PHENERGAN) 12.5 MG tablet, Take 12.5 mg by mouth 2 (two) times daily as needed., Disp: , Rfl:  ?  propranolol (INDERAL) 20 MG tablet, Take 1 tablet (20 mg total) by mouth 2 (two) times daily., Disp: 180 tablet, Rfl: 1 ?  rivaroxaban (XARELTO) 20 MG TABS tablet, Take 1 tablet (20 mg total) by mouth daily with supper., Disp: 30 tablet, Rfl: 4 ?Medication Side Effects: none ? ?Family Medical/ Social History: Changes? No ? ?MENTAL HEALTH EXAM: ? ?There were no vitals taken for this visit.There is no height or weight on file to calculate BMI.   ?General Appearance: Casual and Neat  ?Eye Contact:  Good  ?Speech:  Clear and Coherent  ?Volume:  Normal  ?Mood:  Anxious and Depressed  ?Affect:  Congruent, Depressed, and Anxious  ?Thought Process:  Coherent  ?Orientation:  Full (Time, Place, and Person)  ?Thought Content: Logical   ?Suicidal Thoughts:  No  ?Homicidal Thoughts:  No  ?Memory:  WNL  ?Judgement:  Good  ?Insight:  Good  ?Psychomotor Activity:  Normal  ?Concentration:  Concentration: Good  ?Recall:  Good  ?Fund of Knowledge: Good  ?Language: Good  ?Assets:  Desire for Improvement  ?ADL's:  Intact  ?Cognition: WNL  ?Prognosis:  Good  ? ? ?DIAGNOSES:  ?  ICD-10-CM   ?1. Major depressive disorder, recurrent episode, moderate (HCC)  F33.1   ?  ?2. Generalized anxiety disorder  F41.1   ?  ? ? ?Receiving Psychotherapy: No  ? ? ?RECOMMENDATIONS:  ? ? ?Greater than 50% of 30  min face to face time with patient was spent on counseling and coordination of care. We discussed her current level of stability with anxiety and depression. Discussed her recent decline and possible triggers since getting back from her [place] vacation. Discussed her goals, challenges and expectations, and continued treatment.plan.  ?We agreed to: ?  ?Continue Buspar  to 15 mg to  three times daily as needed. ?Increase Celexa to 40 mg daily. Will take with food to help with nausea. Could make diarrhea worse in the beginning but may pass after a week or two.  ?To Continue Lunesta 5 mg at betime. Belsomra was not approved by insurance. She is happy with Johnnye Sima but says causes metallic taste in mouth next day.  ?Will report worsening symptoms or side effects promptly ?To follow up in 6 weeks to reassess ?Will continue to follow up with PCP regularly ?Provided emergency contact information ?Reviewed PDMP ?  ? ?Elwanda [place], NP  ?

## 2022-02-23 ENCOUNTER — Encounter: Payer: Self-pay | Admitting: Hematology & Oncology

## 2022-03-01 ENCOUNTER — Other Ambulatory Visit: Payer: Self-pay | Admitting: Hematology & Oncology

## 2022-03-02 ENCOUNTER — Inpatient Hospital Stay (HOSPITAL_BASED_OUTPATIENT_CLINIC_OR_DEPARTMENT_OTHER): Payer: Medicare Other | Admitting: Hematology & Oncology

## 2022-03-02 ENCOUNTER — Other Ambulatory Visit: Payer: Self-pay | Admitting: Hematology & Oncology

## 2022-03-02 ENCOUNTER — Encounter: Payer: Self-pay | Admitting: Hematology & Oncology

## 2022-03-02 ENCOUNTER — Inpatient Hospital Stay: Payer: Medicare Other | Attending: Hematology & Oncology

## 2022-03-02 ENCOUNTER — Other Ambulatory Visit: Payer: Self-pay

## 2022-03-02 VITALS — BP 129/79 | HR 58 | Temp 98.6°F | Resp 18 | Ht 66.0 in | Wt 227.0 lb

## 2022-03-02 DIAGNOSIS — R300 Dysuria: Secondary | ICD-10-CM | POA: Insufficient documentation

## 2022-03-02 DIAGNOSIS — C772 Secondary and unspecified malignant neoplasm of intra-abdominal lymph nodes: Secondary | ICD-10-CM

## 2022-03-02 DIAGNOSIS — I82401 Acute embolism and thrombosis of unspecified deep veins of right lower extremity: Secondary | ICD-10-CM | POA: Insufficient documentation

## 2022-03-02 DIAGNOSIS — R197 Diarrhea, unspecified: Secondary | ICD-10-CM | POA: Diagnosis not present

## 2022-03-02 DIAGNOSIS — D510 Vitamin B12 deficiency anemia due to intrinsic factor deficiency: Secondary | ICD-10-CM | POA: Insufficient documentation

## 2022-03-02 DIAGNOSIS — M549 Dorsalgia, unspecified: Secondary | ICD-10-CM | POA: Insufficient documentation

## 2022-03-02 DIAGNOSIS — K55059 Acute (reversible) ischemia of intestine, part and extent unspecified: Secondary | ICD-10-CM | POA: Insufficient documentation

## 2022-03-02 DIAGNOSIS — D500 Iron deficiency anemia secondary to blood loss (chronic): Secondary | ICD-10-CM

## 2022-03-02 DIAGNOSIS — R109 Unspecified abdominal pain: Secondary | ICD-10-CM | POA: Diagnosis not present

## 2022-03-02 DIAGNOSIS — C181 Malignant neoplasm of appendix: Secondary | ICD-10-CM | POA: Insufficient documentation

## 2022-03-02 DIAGNOSIS — I82412 Acute embolism and thrombosis of left femoral vein: Secondary | ICD-10-CM

## 2022-03-02 DIAGNOSIS — R5383 Other fatigue: Secondary | ICD-10-CM | POA: Insufficient documentation

## 2022-03-02 DIAGNOSIS — Z79899 Other long term (current) drug therapy: Secondary | ICD-10-CM | POA: Insufficient documentation

## 2022-03-02 DIAGNOSIS — F32A Depression, unspecified: Secondary | ICD-10-CM | POA: Insufficient documentation

## 2022-03-02 DIAGNOSIS — C786 Secondary malignant neoplasm of retroperitoneum and peritoneum: Secondary | ICD-10-CM | POA: Diagnosis not present

## 2022-03-02 DIAGNOSIS — Z7901 Long term (current) use of anticoagulants: Secondary | ICD-10-CM | POA: Insufficient documentation

## 2022-03-02 DIAGNOSIS — Z880 Allergy status to penicillin: Secondary | ICD-10-CM | POA: Insufficient documentation

## 2022-03-02 LAB — CMP (CANCER CENTER ONLY)
ALT: 21 U/L (ref 0–44)
AST: 23 U/L (ref 15–41)
Albumin: 3.9 g/dL (ref 3.5–5.0)
Alkaline Phosphatase: 133 U/L — ABNORMAL HIGH (ref 38–126)
Anion gap: 6 (ref 5–15)
BUN: 19 mg/dL (ref 6–20)
CO2: 33 mmol/L — ABNORMAL HIGH (ref 22–32)
Calcium: 9.4 mg/dL (ref 8.9–10.3)
Chloride: 103 mmol/L (ref 98–111)
Creatinine: 0.94 mg/dL (ref 0.44–1.00)
GFR, Estimated: >60 mL/min (ref >60)
Glucose, Bld: 150 mg/dL — ABNORMAL HIGH (ref 70–99)
Potassium: 3.7 mmol/L (ref 3.5–5.1)
Sodium: 142 mmol/L (ref 135–145)
Total Bilirubin: 0.4 mg/dL (ref 0.3–1.2)
Total Protein: 7.2 g/dL (ref 6.5–8.1)

## 2022-03-02 LAB — CBC WITH DIFFERENTIAL (CANCER CENTER ONLY)
Abs Immature Granulocytes: 0.03 10*3/uL (ref 0.00–0.07)
Basophils Absolute: 0.1 10*3/uL (ref 0.0–0.1)
Basophils Relative: 1 %
Eosinophils Absolute: 0.3 10*3/uL (ref 0.0–0.5)
Eosinophils Relative: 4 %
HCT: 37.1 % (ref 36.0–46.0)
Hemoglobin: 12 g/dL (ref 12.0–15.0)
Immature Granulocytes: 0 %
Lymphocytes Relative: 52 %
Lymphs Abs: 3.8 10*3/uL (ref 0.7–4.0)
MCH: 30.2 pg (ref 26.0–34.0)
MCHC: 32.3 g/dL (ref 30.0–36.0)
MCV: 93.2 fL (ref 80.0–100.0)
Monocytes Absolute: 0.6 10*3/uL (ref 0.1–1.0)
Monocytes Relative: 8 %
Neutro Abs: 2.6 10*3/uL (ref 1.7–7.7)
Neutrophils Relative %: 35 %
Platelet Count: 285 10*3/uL (ref 150–400)
RBC: 3.98 MIL/uL (ref 3.87–5.11)
RDW: 14.2 % (ref 11.5–15.5)
WBC Count: 7.5 10*3/uL (ref 4.0–10.5)
nRBC: 0 % (ref 0.0–0.2)

## 2022-03-02 LAB — CEA (IN HOUSE-CHCC): CEA (CHCC-In House): 1.17 ng/mL (ref 0.00–5.00)

## 2022-03-02 LAB — LACTATE DEHYDROGENASE: LDH: 183 U/L (ref 98–192)

## 2022-03-02 LAB — VITAMIN B12: Vitamin B-12: 252 pg/mL (ref 180–914)

## 2022-03-02 MED ORDER — DICYCLOMINE HCL 20 MG PO TABS: 20.0000 mg | ORAL_TABLET | Freq: Three times a day (TID) | ORAL | 3 refills | Status: DC

## 2022-03-02 NOTE — Progress Notes (Signed)
?Hematology and Oncology Follow Up Visit ? ?Wendy Barber ?[id number] ?Nov 09, 1960 61 y.o. ?03/02/2022 ? ? ?Principle Diagnosis:  ?History of metastatic appendiceal cancer  -- recurrent  ?Superior mesenteric vein thrombus ?Tthromboembolism of the RIGHT leg ?Pernicious anemia ? ?Current Therapy:   ?HIPEC - Surgery done in [place] in 11/2020 ?Xarelto 20 mg p.o. daily ?Vitamin B12 1000 mcg IM monthly ?    ?Interim History:  [name] is back for follow-up.  She is doing okay.  She just got back from [place].  She and her family were there.  They had a wonderful time. ? ?She is having problems with diarrhea.  She wants to know if Bentyl may help.  We could certainly try her on Bentyl to see if this may help. ? ?She has had no nausea or vomiting.  She has had no bleeding.  She is on Xarelto for the superior mesenteric vein thrombus. ? ?She has had no fever.  She has had no leg swelling.  She is doing really well with her respect to her weight. ? ?Her last vitamin B12 level, which was done today actually was a 252. ? ?When we last saw her in March, her chromogranin A level was 197.  Her CEA level was less than 1. ? ?She is not sure when she sees her surgeon up at [facility]. ? ?We probably need to do a scan on her while she is down here again. ? ?Overall, I would say performance status is probably ECOG 0. ? ? ?Medications:  ?Current Outpatient Medications:  ?  busPIRone (BUSPAR) 15 MG tablet, Take 1 tablet (15 mg total) by mouth 3 (three) times daily., Disp: 270 tablet, Rfl: 1 ?  citalopram (CELEXA) 40 MG tablet, Take 1 tablet (40 mg total) by mouth daily., Disp: 30 tablet, Rfl: 1 ?  diphenoxylate-atropine (LOMOTIL) 2.5-0.025 MG tablet, Take 2 tablets by mouth in the morning, at noon, in the evening, and at bedtime., Disp: 240 tablet, Rfl: 3 ?  eszopiclone (LUNESTA) 2 MG TABS tablet, Take 1 tablet (2 mg total) by mouth at bedtime as needed for sleep. Take immediately before bedtime, Disp: 30 tablet, Rfl: 3 ?   famotidine (PEPCID) 20 MG tablet, TAKE 2 TABLETS TWICE A DAY, Disp: 360 tablet, Rfl: 3 ?  loperamide (IMODIUM) 2 MG capsule, Take 4 mg by mouth in the morning, at noon, in the evening, and at bedtime., Disp: , Rfl:  ?  Melatonin 1 MG CHEW, Chew 5 mg by mouth., Disp: , Rfl:  ?  ondansetron (ZOFRAN) 8 MG tablet, Take 8 mg by mouth every 8 (eight) hours as needed., Disp: , Rfl:  ?  orphenadrine (NORFLEX) 100 MG tablet, Take 1 tablet (100 mg total) by mouth at bedtime as needed for muscle spasms., Disp: 30 tablet, Rfl: 2 ?  pantoprazole (PROTONIX) 40 MG tablet, Take 40 mg by mouth every morning., Disp: , Rfl:  ?  promethazine (PHENERGAN) 12.5 MG tablet, Take 12.5 mg by mouth 2 (two) times daily as needed., Disp: , Rfl:  ?  propranolol (INDERAL) 20 MG tablet, Take 1 tablet (20 mg total) by mouth 2 (two) times daily., Disp: 180 tablet, Rfl: 1 ?  XARELTO 20 MG TABS tablet, TAKE 1 TABLET DAILY WITH SUPPER, Disp: 30 tablet, Rfl: 11 ?  HYDROmorphone (DILAUDID) 2 MG tablet, Take 1 tablet (2 mg total) by mouth every 6 (six) hours as needed for severe pain., Disp: 60 tablet, Rfl: 0 ? ?Allergies:  ?Allergies  ?Allergen Reactions  ?  Penicillins Shortness Of Breath  ?  Other reaction(s): Irregular Heart Rate, Other (See Comments) ?Rapid heartrate ?  ? Alprazolam Hives and Other (See Comments)  ?  Hard to arouse, unresponsiveness  ? Ativan [Lorazepam] Other (See Comments)  ?  Note: tolerates midazolam fine ?Face & Throat Swelling.  ? Corticosteroids Other (See Comments)  ?  Other reaction(s): Other (see comments) ?Psychotic behaviour ? ?  ? Erythromycin   ?  Other reaction(s): Other (See Comments) ?Severe stomach pain ?  ? Erythromycin Base Hives  ? Milnacipran Hcl Other (See Comments)  ?  Other reaction(s):  ?psychotic episode ? ? ?  ? Prednisone Other (See Comments)  ?  Anxiety & Nervous Breakdown.  ? Savella  [Milnacipran]   ? Povidone Iodine Rash  ?  Had rash under bandage after surgery , has had betadine since without reaction    ? Prednisolone Anxiety  ? ? ?Past Medical History, Surgical history, Social history, and Family History were reviewed and updated. ? ?Review of Systems: ?Review of Systems  ?Constitutional:  Positive for fatigue and unexpected weight change.  ?HENT:  Negative.    ?Eyes: Negative.   ?Respiratory: Negative.    ?Cardiovascular: Negative.   ?Gastrointestinal:  Positive for abdominal pain and diarrhea.  ?Endocrine: Negative.   ?Genitourinary:  Positive for dysuria.   ?Musculoskeletal:  Positive for back pain.  ?Skin: Negative.   ?Neurological: Negative.   ?Hematological: Negative.   ?Psychiatric/Behavioral:  Positive for depression.   ? ?Physical Exam: ? height is '5\' 6"'$  (1.676 m) and weight is 227 lb (103 kg). Her oral temperature is 98.6 ?F (37 ?C). Her blood pressure is 129/79 and her pulse is 58 (abnormal). Her respiration is 18 and oxygen saturation is 99%.  ? ?Wt Readings from Last 3 Encounters:  ?03/02/22 227 lb (103 kg)  ?11/29/21 217 lb 3.2 oz (98.5 kg)  ?07/27/21 212 lb (96.2 kg)  ? ? ?Physical Exam ?Vitals reviewed.  ?HENT:  ?   Head: Normocephalic and atraumatic.  ?Eyes:  ?   Pupils: Pupils are equal, round, and reactive to light.  ?Cardiovascular:  ?   Rate and Rhythm: Normal rate and regular rhythm.  ?   Heart sounds: Normal heart sounds.  ?Pulmonary:  ?   Effort: Pulmonary effort is normal.  ?   Breath sounds: Normal breath sounds.  ?Abdominal:  ?   General: Bowel sounds are normal.  ?   Palpations: Abdomen is soft.  ?   Comments: Abdominal exam shows healed laparotomy scar.  She has no fluid wave.  There is no guarding or rebound tenderness.  She has decent bowel sounds.  There is no palpable liver or spleen tip.  ?Musculoskeletal:     ?   General: No tenderness or deformity. Normal range of motion.  ?   Cervical back: Normal range of motion.  ?Lymphadenopathy:  ?   Cervical: No cervical adenopathy.  ?Skin: ?   General: Skin is warm and dry.  ?   Findings: No erythema or rash.  ?Neurological:  ?   Mental  Status: She is alert and oriented to person, place, and time.  ?Psychiatric:     ?   Behavior: Behavior normal.     ?   Thought Content: Thought content normal.     ?   Judgment: Judgment normal.  ? ? ? ?Lab Results  ?Component Value Date  ? WBC 7.5 03/02/2022  ? HGB 12.0 03/02/2022  ? HCT 37.1 03/02/2022  ? MCV  93.2 03/02/2022  ? PLT 285 03/02/2022  ? ?  Chemistry   ?   ?Component Value Date/Time  ? NA 142 03/02/2022 0851  ? K 3.7 03/02/2022 0851  ? CL 103 03/02/2022 0851  ? CO2 33 (H) 03/02/2022 0851  ? BUN 19 03/02/2022 0851  ? CREATININE 0.94 03/02/2022 0851  ?    ?Component Value Date/Time  ? CALCIUM 9.4 03/02/2022 0851  ? ALKPHOS 133 (H) 03/02/2022 0851  ? AST 23 03/02/2022 0851  ? ALT 21 03/02/2022 0851  ? BILITOT 0.4 03/02/2022 0851  ?  ? ? ?Impression and Plan: ?Ms. Elmendorf is a very charming 61 year-old white female.  She has an incredible history.  She now has had a recurrence of her appendiceal cancer.  I am just absolutely amazed by this.  It has been about 15 years or so since she had her initial appendiceal cancer removed.  She went back up to [facility] for another HIPEC procedure.  She had a lot of issues afterwards.  She was hospitalized here with a lot of pain and diarrhea and nonhealing.  She eventually did improve. ? ?We will plan for another CAT scan on her.  Hopefully, this will not show [place] a problem. ? ?The fact that the chromogranin A level is elevated is a little bit troublesome. ? ?She said that she will let me know when she goes back up to [place].  We will then plan to get a CT scan on her before she goes up there so she can take the result of to her surgeon. ? ?She is on Xarelto.  She is doing well with the Xarelto.  We will not change the dose on this right now. ? ? ?Volanda Napoleon, MD ?5/10/20233:41 PM ?

## 2022-03-02 NOTE — Addendum Note (Signed)
Addended by: Burney Gauze R on: 03/02/2022 03:53 PM ? ? Modules accepted: Orders ? ?

## 2022-03-03 ENCOUNTER — Telehealth: Payer: Self-pay

## 2022-03-03 LAB — CHROMOGRANIN A: Chromogranin A (ng/mL): 304.9 ng/mL — ABNORMAL HIGH (ref 0.0–101.8)

## 2022-03-03 NOTE — Telephone Encounter (Signed)
-----   Message from Volanda Napoleon, MD sent at 03/02/2022  5:56 PM EDT ----- ?Please let her know that the vitamin B12 level is okay. ?

## 2022-03-04 ENCOUNTER — Other Ambulatory Visit: Payer: Self-pay | Admitting: Hematology & Oncology

## 2022-03-04 DIAGNOSIS — C181 Malignant neoplasm of appendix: Secondary | ICD-10-CM

## 2022-03-07 ENCOUNTER — Encounter: Payer: Self-pay | Admitting: Hematology & Oncology

## 2022-03-15 ENCOUNTER — Encounter: Payer: Self-pay | Admitting: Hematology & Oncology

## 2022-03-23 ENCOUNTER — Ambulatory Visit (HOSPITAL_COMMUNITY)
Admission: RE | Admit: 2022-03-23 | Discharge: 2022-03-23 | Disposition: A | Discharge status: Home / Self Care | Payer: Medicare Other | Source: Ambulatory Visit | Attending: Hematology & Oncology | Admitting: Hematology & Oncology

## 2022-03-23 DIAGNOSIS — C181 Malignant neoplasm of appendix: Secondary | ICD-10-CM | POA: Diagnosis present

## 2022-03-23 DIAGNOSIS — C772 Secondary and unspecified malignant neoplasm of intra-abdominal lymph nodes: Secondary | ICD-10-CM | POA: Diagnosis present

## 2022-03-23 MED ORDER — COPPER CU 64 DOTATATE 1 MCI/ML IV SOLN
4.0000 | Freq: Once | INTRAVENOUS | Status: AC
  Administered 2022-03-23: 4 via INTRAVENOUS

## 2022-03-24 ENCOUNTER — Telehealth: Payer: Self-pay | Admitting: *Deleted

## 2022-03-24 NOTE — Telephone Encounter (Signed)
As noted below by Dr. [name], I informed the patient that her scan was NEGATIVE for any obvious neuroendocrine cancer! She verbalized understanding.

## 2022-03-24 NOTE — Telephone Encounter (Signed)
-----   Message from Volanda Napoleon, MD sent at 03/24/2022  2:00 PM EDT ----- Call - the scan is NEGATIVE for any obvious neuroendocrine cancer!!!!  Demetrios Isaacs!!  [name]

## 2022-04-05 ENCOUNTER — Encounter: Payer: Self-pay | Admitting: Behavioral Health

## 2022-04-05 ENCOUNTER — Ambulatory Visit (INDEPENDENT_AMBULATORY_CARE_PROVIDER_SITE_OTHER): Payer: Medicare Other | Admitting: Behavioral Health

## 2022-04-05 DIAGNOSIS — F411 Generalized anxiety disorder: Secondary | ICD-10-CM

## 2022-04-05 DIAGNOSIS — F99 Mental disorder, not otherwise specified: Secondary | ICD-10-CM

## 2022-04-05 DIAGNOSIS — F418 Other specified anxiety disorders: Secondary | ICD-10-CM

## 2022-04-05 DIAGNOSIS — F5105 Insomnia due to other mental disorder: Secondary | ICD-10-CM | POA: Diagnosis not present

## 2022-04-05 DIAGNOSIS — F331 Major depressive disorder, recurrent, moderate: Secondary | ICD-10-CM | POA: Diagnosis not present

## 2022-04-05 MED ORDER — CITALOPRAM HYDROBROMIDE 40 MG PO TABS: 40.0000 mg | ORAL_TABLET | Freq: Every day | ORAL | 3 refills | Status: DC

## 2022-04-05 MED ORDER — BUPROPION HCL ER (XL) 150 MG PO TB24: 150.0000 mg | ORAL_TABLET | Freq: Every day | ORAL | 1 refills | Status: DC

## 2022-04-05 MED ORDER — BUSPIRONE HCL 15 MG PO TABS: 15.0000 mg | ORAL_TABLET | Freq: Three times a day (TID) | ORAL | 3 refills | Status: DC

## 2022-04-05 NOTE — Progress Notes (Signed)
[facility]  Patient ID: Wendy Barber,  MRN: [id number]  PCP: [name], MD  Date of Evaluation: 04/05/2022 Time spent:30 minutes  Chief Complaint:  Chief Complaint   Anxiety; Depression; Follow-up; Medication Refill; Medication Problem     HISTORY/CURRENT STATUS: HPI  61 year old female presents to this office for follow up and medication management.  No changes this visit. She says that she "has been holding her own" with anxiety and depression but has good and bad days. Lately she says her depression is worse. She does not feel like getting out of the house often. Know it bothers her husband. Diarrhea has improved a little. Buspar is working very well for anxiety. Reports her anxiety today at 2/10 and depression at 6/10. She is sleeping 7 hours per night with Lunesta and is happy with the med except for metallic taste in her mouth the next day. She still wants to continue. She is requesting adjustments to her medication at this time.  She understands that having short bowel syndrome is lifetime struggle. Her family remains highly supportive. She denies any mania, no psychosis, No current SI or HI.      Individual Medical History/ Review of Systems: Changes? :No   Allergies: Penicillins, Alprazolam, Ativan [lorazepam], Corticosteroids, Erythromycin, Erythromycin base, Milnacipran hcl, Prednisone, Savella  [milnacipran], Povidone iodine, and Prednisolone  Current Medications:  Current Outpatient Medications:    buPROPion (WELLBUTRIN XL) 150 MG 24 hr tablet, Take 1 tablet (150 mg total) by mouth daily., Disp: 30 tablet, Rfl: 1   busPIRone (BUSPAR) 15 MG tablet, Take 1 tablet (15 mg total) by mouth 3 (three) times daily., Disp: 270 tablet, Rfl: 3   citalopram (CELEXA) 40 MG tablet, Take 1 tablet (40 mg total) by mouth daily., Disp: 30 tablet, Rfl: 3   dicyclomine (BENTYL) 20 MG tablet, Take 1 tablet (20 mg total) by mouth 3 (three) times daily before meals.,  Disp: 90 tablet, Rfl: 3   diphenoxylate-atropine (LOMOTIL) 2.5-0.025 MG tablet, Take 2 tablets by mouth in the morning, at noon, in the evening, and at bedtime., Disp: 240 tablet, Rfl: 3   eszopiclone (LUNESTA) 2 MG TABS tablet, Take 1 tablet (2 mg total) by mouth at bedtime as needed for sleep. Take immediately before bedtime, Disp: 30 tablet, Rfl: 3   famotidine (PEPCID) 20 MG tablet, TAKE 2 TABLETS TWICE A DAY, Disp: 360 tablet, Rfl: 3   HYDROmorphone (DILAUDID) 2 MG tablet, Take 1 tablet (2 mg total) by mouth every 6 (six) hours as needed for severe pain., Disp: 60 tablet, Rfl: 0   loperamide (IMODIUM) 2 MG capsule, Take 4 mg by mouth in the morning, at noon, in the evening, and at bedtime., Disp: , Rfl:    Melatonin 1 MG CHEW, Chew 5 mg by mouth., Disp: , Rfl:    ondansetron (ZOFRAN) 8 MG tablet, Take 8 mg by mouth every 8 (eight) hours as needed., Disp: , Rfl:    orphenadrine (NORFLEX) 100 MG tablet, Take 1 tablet (100 mg total) by mouth at bedtime as needed for muscle spasms., Disp: 30 tablet, Rfl: 2   pantoprazole (PROTONIX) 40 MG tablet, Take 40 mg by mouth every morning., Disp: , Rfl:    promethazine (PHENERGAN) 12.5 MG tablet, Take 12.5 mg by mouth 2 (two) times daily as needed., Disp: , Rfl:    propranolol (INDERAL) 20 MG tablet, Take 1 tablet (20 mg total) by mouth 2 (two) times daily., Disp: 180 tablet, Rfl: 1   XARELTO  20 MG TABS tablet, TAKE 1 TABLET DAILY WITH SUPPER, Disp: 30 tablet, Rfl: 11 Medication Side Effects: none  Family Medical/ Social History: Changes? No  MENTAL HEALTH EXAM:  There were no vitals taken for this visit.There is no height or weight on file to calculate BMI.  General Appearance: Casual and Neat  Eye Contact:  Good  Speech:  Clear and Coherent  Volume:  Normal  Mood:  Depressed  Affect:  Congruent and Depressed  Thought Process:  Coherent  Orientation:  Full (Time, Place, and Person)  Thought Content: Logical   Suicidal Thoughts:  No  Homicidal  Thoughts:  No  Memory:  WNL  Judgement:  Good  Insight:  Good  Psychomotor Activity:  Normal  Concentration:  Concentration: Good  Recall:  Good  Fund of Knowledge: Good  Language: Good  Assets:  Desire for Improvement  ADL's:  Intact  Cognition: WNL  Prognosis:  Good    DIAGNOSES:    ICD-10-CM   1. Generalized anxiety disorder  F41.1 buPROPion (WELLBUTRIN XL) 150 MG 24 hr tablet    citalopram (CELEXA) 40 MG tablet    busPIRone (BUSPAR) 15 MG tablet    2. Major depressive disorder, recurrent episode, moderate (HCC)  F33.1 buPROPion (WELLBUTRIN XL) 150 MG 24 hr tablet    citalopram (CELEXA) 40 MG tablet    busPIRone (BUSPAR) 15 MG tablet    3. Depression with anxiety  F41.8     4. Insomnia due to other mental disorder  F51.05    F99       Receiving Psychotherapy: No    RECOMMENDATIONS:    Greater than 50% of 30  min face to face time with patient was spent on counseling and coordination of care.  We discussed her current level of stability with anxiety and depression. Discussed her recent decline and possible triggers since getting back from her [place] vacation.  She is still feeling depressed and struggles with motivation. Discussed her goals, challenges and expectations, and continued treatment.plan.  We agreed to:  Will start Wellbutrin 150 mg XR daily Continue Buspar  to 15 mg to  three times daily as needed. Continue Celexa to 40 mg daily. Will take with food to help with nausea. Could make diarrhea worse in the beginning but may pass after a week or two.  To Continue Lunesta 5 mg at betime. Belsomra was not approved by insurance. She is happy with Johnnye Sima but says causes metallic taste in mouth next day.  Will report worsening symptoms or side effects promptly To follow up in 6 weeks to reassess Will continue to follow up with PCP regularly Provided emergency contact information Reviewed [place], NP

## 2022-04-18 ENCOUNTER — Encounter: Payer: Self-pay | Admitting: Hematology & Oncology

## 2022-04-21 ENCOUNTER — Ambulatory Visit (INDEPENDENT_AMBULATORY_CARE_PROVIDER_SITE_OTHER): Payer: Medicare Other | Admitting: Family Medicine

## 2022-04-21 ENCOUNTER — Other Ambulatory Visit: Payer: Self-pay

## 2022-04-21 ENCOUNTER — Encounter: Payer: Self-pay | Admitting: Family Medicine

## 2022-04-21 VITALS — BP 122/88 | HR 84 | Temp 98.0°F | Resp 18 | Ht 66.0 in | Wt 229.8 lb

## 2022-04-21 DIAGNOSIS — R200 Anesthesia of skin: Secondary | ICD-10-CM

## 2022-04-21 DIAGNOSIS — R42 Dizziness and giddiness: Secondary | ICD-10-CM

## 2022-04-21 DIAGNOSIS — R110 Nausea: Secondary | ICD-10-CM

## 2022-04-21 DIAGNOSIS — F411 Generalized anxiety disorder: Secondary | ICD-10-CM | POA: Diagnosis not present

## 2022-04-21 DIAGNOSIS — J22 Unspecified acute lower respiratory infection: Secondary | ICD-10-CM

## 2022-04-21 DIAGNOSIS — R051 Acute cough: Secondary | ICD-10-CM

## 2022-04-21 DIAGNOSIS — H6692 Otitis media, unspecified, left ear: Secondary | ICD-10-CM

## 2022-04-21 MED ORDER — DOXYCYCLINE HYCLATE 100 MG PO TABS: 100.0000 mg | ORAL_TABLET | Freq: Two times a day (BID) | ORAL | 0 refills | Status: DC

## 2022-04-21 MED ORDER — PROMETHAZINE HCL 12.5 MG PO TABS: 12.5000 mg | ORAL_TABLET | Freq: Two times a day (BID) | ORAL | 3 refills | Status: DC | PRN

## 2022-04-21 MED ORDER — PROPRANOLOL HCL 20 MG PO TABS: 20.0000 mg | ORAL_TABLET | Freq: Two times a day (BID) | ORAL | 1 refills | Status: DC

## 2022-04-21 NOTE — Progress Notes (Signed)
Subjective:  Patient ID: Wendy Barber, female    DOB: [birth year]  Age: 61 y.o. MRN: [id number]  CC:  Chief Complaint  Patient presents with   Cough    Patient states for the last week she has been experiencing a cough , left ear pain and some congestion in chest. Patient states she has been taking OTC cough medication.    HPI Wendy Barber presents for   Cough, congestion, left ear pain: Started about a week and a half ago. Husband initially came home with cough, then she had symptoms few days later. He did 1 covid test - negative, she did not test.  No fever. Min HA,  No new bodyaches.  Green phlegm when productive. Only minimal nasal congestion.  Left ear clogged past 5 days, worse at night. Sore. No drainage.  Some persistent cough, congestion. Some nausea with congestion - few episodes of vomiting. Needs refill of phenergan - usually takes zofran every morning phenergan at night for chronic nausea.  Slight eye watering, not matted. Not using contact lenses.  No dyspnea at rest.  Eating and drinking ok. Drinking fluids.   Tx: nyquil for 2 nights - min change. Stopped as caused diarrhea, chronic diarrhea.  Unsure if mucinex helped in past.  Has tolerated doxycyline in past.  Not waking up d/t cough. Cough with lying down at times.   Tongue numbness,  occasional dizzy spells for months - vertigo for past 6 months, Numbness tip of tongue and bottom inside lip and top of roof of mouth since August of last year. Discussed with Kathrin Ruddy on 11/29/21.  Vitamin D was low at that time at 20.29, recommended over-the-counter supplement - had taken prior, not recent.  B12 and folate normal.  CBC normal, glucose 116 but otherwise CMP overall reassuring.  A1c 6.8.TSH normal, Sjogren's antibody negative.  CT head on February 8, similar posterior fossa meningioma without parenchymal edema, no new mass or abnormal enhancement She is followed by oncology, Dr. [name] locally, surgeon at [facility]. history of appendiceal cancer, status post HIPEC procedure malignant pseudomyxoma peritonei, superior mesenteric vein thrombus, last office visit May 10.  Phenergan as needed for nausea or vomiting.  Propranolol working well for anxiety symptoms- needs refill.     History Patient Active Problem List   Diagnosis Date Noted   Pernicious anemia 05/14/2021   Iron deficiency anemia due to chronic blood loss 05/14/2021   Abdominal wall abscess    Hypokalemia    Surgical wound infection 01/16/2021   C. 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[place] [employer]) 03/19/2020   Goals of care, counseling/discussion 03/19/2020   Malignant pseudomyxoma peritonei ([place]) 03/19/2020   DVT of deep femoral vein, left ([place]) 03/19/2020   Pulmonary embolism, bilateral ([place]) 03/19/2020   Presence of IVC filter 03/19/2020   Hepatic encephalopathy ([place]) 11/22/2019   Confusion 11/21/2019   Autoimmune hepatitis ([place]) 11/21/2019   HTN (hypertension) 11/21/2019   DM2 (diabetes mellitus, type 2) ([place]) 11/21/2019   AMS (altered mental status) 11/21/2019   Closed right ankle fracture 08/27/2019   Past Medical History:  Diagnosis Date   Arthritis    Back pain    Cancer ([place])    pseudomyxoma peritonei   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[place]) 03/19/2020   Chronic fatigue syndrome    Diabetes mellitus without complication ([place])    DVT of deep femoral vein, left ([place]) 03/19/2020   Fibromyalgia  Goals of care, counseling/discussion 03/19/2020   Hypertension    Iron deficiency anemia due to chronic blood loss 05/14/2021   Malignant pseudomyxoma peritonei ([place]) 03/19/2020   Pernicious anemia 05/14/2021   Presence of IVC filter 03/19/2020   Pulmonary embolism, bilateral ([place]) 03/19/2020   Past Surgical History:  Procedure Laterality Date   ABDOMINAL HYSTERECTOMY     ABDOMINAL SURGERY     ACHILLES TENDON REPAIR     APPENDECTOMY      ARTHROPLASTY     CARPAL TUNNEL RELEASE     CHOLECYSTECTOMY     IR CATHETER TUBE CHANGE  02/02/2021   IR CATHETER TUBE CHANGE  02/25/2021   IR RADIOLOGIST EVAL & MGMT  02/24/2021   IR RADIOLOGIST EVAL & MGMT  03/10/2021   IR US GUIDE BX ASP/DRAIN  11/25/2019   JOINT REPLACEMENT     KNEE ARTHROSCOPY     ORIF ANKLE FRACTURE Right 08/27/2019   Procedure: OPEN REDUCTION INTERNAL FIXATION RIGHT ANKLE FRACTURE;  Surgeon: Renette Butters, MD;  Location: [facility];  Service: Orthopedics;  Laterality: Right;   perineorrophy     TONGUE BIOPSY     Allergies  Allergen Reactions   Penicillins Shortness Of Breath    Other reaction(s): Irregular Heart Rate, Other (See Comments) Rapid heartrate    Alprazolam Hives and Other (See Comments)    Hard to arouse, unresponsiveness   Ativan [Lorazepam] Other (See Comments)    Note: tolerates midazolam fine Face & Throat Swelling.   Corticosteroids Other (See Comments)    Other reaction(s): Other (see comments) Psychotic behaviour     Erythromycin     Other reaction(s): Other (See Comments) Severe stomach pain    Erythromycin Base Hives   Milnacipran Hcl Other (See Comments)    Other reaction(s):  psychotic episode      Prednisone Other (See Comments)    Anxiety & Nervous Breakdown.   Savella  [Milnacipran]    Povidone Iodine Rash    Had rash under bandage after surgery , has had betadine since without reaction    Prednisolone Anxiety   Prior to Admission medications   Medication Sig Start Date End Date Taking? Authorizing Provider  buPROPion (WELLBUTRIN XL) 150 MG 24 hr tablet Take 1 tablet (150 mg total) by mouth daily. 04/05/22  Yes White, Aaron Edelman A, NP  busPIRone (BUSPAR) 15 MG tablet Take 1 tablet (15 mg total) by mouth 3 (three) times daily. 04/05/22  Yes Lesle Chris A, NP  citalopram (CELEXA) 40 MG tablet Take 1 tablet (40 mg total) by mouth daily. 04/05/22  Yes White, Louanna Raw, NP  diphenoxylate-atropine (LOMOTIL) 2.5-0.025 MG tablet Take 2  tablets by mouth in the morning, at noon, in the evening, and at bedtime. 10/11/21  Yes Volanda Napoleon, MD  famotidine (PEPCID) 20 MG tablet TAKE 2 TABLETS TWICE A DAY 03/01/22  Yes Ennever, Rudell Cobb, MD  loperamide (IMODIUM) 2 MG capsule Take 4 mg by mouth in the morning, at noon, in the evening, and at bedtime.   Yes [provider]  ondansetron (ZOFRAN) 8 MG tablet Take 8 mg by mouth every 8 (eight) hours as needed. 03/03/21  Yes [provider]  orphenadrine (NORFLEX) 100 MG tablet Take 1 tablet (100 mg total) by mouth at bedtime as needed for muscle spasms. 09/29/21  Yes Volanda Napoleon, MD  pantoprazole (PROTONIX) 40 MG tablet Take 40 mg by mouth every morning. 01/01/21  Yes [provider]  promethazine (PHENERGAN) 12.5 MG tablet Take  12.5 mg by mouth 2 (two) times daily as needed. 03/04/21  Yes [provider]  propranolol (INDERAL) 20 MG tablet Take 1 tablet (20 mg total) by mouth 2 (two) times daily. 09/22/21  Yes White, Louanna Raw, NP  XARELTO 20 MG TABS tablet TAKE 1 TABLET DAILY WITH SUPPER 03/02/22  Yes Ennever, Rudell Cobb, MD  dicyclomine (BENTYL) 20 MG tablet Take 1 tablet (20 mg total) by mouth 3 (three) times daily before meals. Patient not taking: Reported on 04/21/2022 03/02/22   Volanda Napoleon, MD  eszopiclone (LUNESTA) 2 MG TABS tablet Take 1 tablet (2 mg total) by mouth at bedtime as needed for sleep. Take immediately before bedtime 11/22/21   Elwanda Brooklyn, NP  Melatonin 1 MG CHEW Chew 5 mg by mouth. Patient not taking: Reported on 04/21/2022    [provider]   Social History   Socioeconomic History   Marital status: Married    Spouse name: Not on file   Number of children: 3   Years of education: Not on file   Highest education level: Some college, no degree  Occupational History   Occupation: disability  Tobacco Use   Smoking status: Never   Smokeless tobacco: Never  Vaping Use   Vaping Use: Never used  Substance and Sexual  Activity   Alcohol use: No   Drug use: Never   Sexual activity: Yes    Birth control/protection: None    Comment: hysterectomy  Other Topics Concern   Not on file  Social History Narrative   Right handed   One story home   Drinks occasional caffeine   Social Determinants of Health   Financial Resource Strain: Not on file  Food Insecurity: Not on file  Transportation Needs: Not on file  Physical Activity: Not on file  Stress: Not on file  Social Connections: Not on file  Intimate Partner Violence: Not on file    Review of Systems Per HPI.   Objective:   Vitals:   04/21/22 1555  BP: 122/88  Pulse: 84  Resp: 18  Temp: 98 F (36.7 C)  TempSrc: Temporal  SpO2: 95%  Weight: 229 lb 12.8 oz (104.2 kg)  Height: '5\' 6"'$  (1.676 m)     Physical Exam Vitals reviewed.  Constitutional:      General: She is not in acute distress.    Appearance: She is well-developed.  HENT:     Head: Normocephalic and atraumatic.     Right Ear: Hearing, tympanic membrane, ear canal and external ear normal.     Left Ear: Hearing, ear canal and external ear normal.     Ears:     Comments: Injected, dull tm. Slight discolored fluid behind tm. No rupture. Canal clear.     Nose: Nose normal.     Mouth/Throat:     Mouth: Mucous membranes are moist.     Pharynx: No posterior oropharyngeal erythema.     Comments: Equal facial movements, mouth movements and tongue projection midline.  No tongue coating, buccal mucosa or mucosal coating inside her lower lip.  There were no oral lesions noted. Eyes:     Extraocular Movements:     Right eye: Nystagmus (1-2 beats of horizontal nystagmus noted bilaterally) present.     Left eye: Nystagmus present.     Conjunctiva/sclera: Conjunctivae normal.     Pupils: Pupils are equal, round, and reactive to light.  Cardiovascular:     Rate and Rhythm: Normal rate and regular rhythm.  Heart sounds: Normal heart sounds. No murmur heard. Pulmonary:     Effort:  Pulmonary effort is normal. No respiratory distress.     Breath sounds: Normal breath sounds. No wheezing or rhonchi.     Comments: Lungs clear without distress.  Speaking in full sentences. Skin:    General: Skin is warm and dry.     Findings: No rash.  Neurological:     Mental Status: She is alert and oriented to person, place, and time.  Psychiatric:        Mood and Affect: Mood normal.        Behavior: Behavior normal.      52 minutes spent during visit, including chart review of tongue symptoms, prior work-up, labs and imaging, discussion of acute symptoms and other medications as above, counseling and assimilation of information, exam, discussion of plan, and chart completion.    Assessment & Plan:  Wendy Barber is a 61 y.o. female . Acute cough LRTI (lower respiratory tract infection) Left otitis media, unspecified otitis media type - Plan: doxycycline (VIBRA-TABS) 100 MG tablet  -Probable initial viral infection, with persistent cough, upper chest congestion and now likely left otitis media.  Some antibiotic allergies and intolerances previously.  Will avoid azithromycin for now, has taken doxycycline without reported difficulty previously.  Potential side effects were discussed.  Mucinex for cough.  Continue fluids, RTC/ER precautions if not improving or worsening.  Given clear exam deferred x-ray at this time.  Generalized anxiety disorder - Plan: propranolol (INDERAL) 20 MG tablet  -Stable with propanolol, continue same, no other med changes for now, will discuss chronic medications further at follow-up visit in the next month to 6 weeks.  Chronic nausea - Plan: promethazine (PHENERGAN) 12.5 MG tablet  -Phenergan refilled for now, stable symptoms.  Continue follow-up with specialist here in 4 to 6 weeks follow-up for chronic medication discussion.  Numbness of tongue - Plan: Ambulatory referral to Neurology, Ambulatory referral to ENT  -Prior work-up with labs and CT  noted.  Mildly low vitamin D but less likely cause of the symptoms.  Previous meningioma that appears stable.  Ongoing symptoms of tongue numbness for the past 10 months.  Anterior tongue and lower lip involvement would indicate V3/mandibular branch of trigeminal nerve involvement, but roof of mouth would be maxillary/V2 branch.   -Refer to neurology and ENT to discuss further work-up.  RTC/ER precautions if any acute worsening or new neurologic symptoms.  Vertigo - Plan: Ambulatory referral to Neurology, Ambulatory referral to ENT  -Longstanding symptoms as above.  Refer to ENT, also for possible trigeminal symptoms.  Has antiemetics, could consider meclizine but again will refer to ENT initially.  Meds ordered this encounter  Medications   promethazine (PHENERGAN) 12.5 MG tablet    Sig: Take 1 tablet (12.5 mg total) by mouth 2 (two) times daily as needed.    Dispense:  30 tablet    Refill:  3   propranolol (INDERAL) 20 MG tablet    Sig: Take 1 tablet (20 mg total) by mouth 2 (two) times daily.    Dispense:  180 tablet    Refill:  1   doxycycline (VIBRA-TABS) 100 MG tablet    Sig: Take 1 tablet (100 mg total) by mouth 2 (two) times daily.    Dispense:  20 tablet    Refill:  0   Patient Instructions   For cough, start Mucinex over-the-counter.  That may help reduce some of the phlegm.  It does appear  that you may have an ear infection on the left.  Start doxycycline. That should be able to help both the cough and the ear infection. I expect cough to be improving in the next week.  Be seen if any fevers, shortness of breath or worsening symptoms.  Phenergan refilled for nausea.  Propanolol refilled for anxiety.  For the vertigo and tongue/mouth numbness I will refer you to neurology and ear nose and throat.  Vitamin D was low in February, take over-the-counter vitamin D supplement and follow-up with me in 1 month and we can recheck those levels.  If any new symptoms, be seen right away.   Thank you for coming in today and let me know if there are questions.  If you have lab work done today you will be contacted with your lab results within the next 2 weeks.  If you have not heard from [place] then please contact us. The fastest way to get your results is to register for My Chart.   IF you received an x-ray today, you will receive an invoice from [facility]. Please contact [facility] at [phone] with questions or concerns regarding your invoice.   IF you received labwork today, you will receive an invoice from [facility]. Please contact [facility] at [phone] with questions or concerns regarding your invoice.   Our billing staff will not be able to assist you with questions regarding bills from these companies.  You will be contacted with the lab results as soon as they are available. The fastest way to get your results is to activate your My Chart account. Instructions are located on the last page of this paperwork. If you have not heard from [place] regarding the results in 2 weeks, please contact this office.        Signed,   Merri Ray, MD [place] 04/21/22 6:00 PM

## 2022-04-21 NOTE — Patient Instructions (Addendum)
  For cough, start Mucinex over-the-counter.  That may help reduce some of the phlegm.  It does appear that you may have an ear infection on the left.  Start doxycycline. That should be able to help both the cough and the ear infection. I expect cough to be improving in the next week.  Be seen if any fevers, shortness of breath or worsening symptoms.  Phenergan refilled for nausea.  Propanolol refilled for anxiety.  For the vertigo and tongue/mouth numbness I will refer you to neurology and ear nose and throat.  Vitamin D was low in February, take over-the-counter vitamin D supplement and follow-up with me in 1 month and we can recheck those levels.  If any new symptoms, be seen right away.  Thank you for coming in today and let me know if there are questions.  If you have lab work done today you will be contacted with your lab results within the next 2 weeks.  If you have not heard from [place] then please contact us. The fastest way to get your results is to register for My Chart.   IF you received an x-ray today, you will receive an invoice from [facility]. Please contact [facility]iology at [phone] with questions or concerns regarding your invoice.   IF you received labwork today, you will receive an invoice from [facility]. Please contact [facility] at [phone] with questions or concerns regarding your invoice.   Our billing staff will not be able to assist you with questions regarding bills from these companies.  You will be contacted with the lab results as soon as they are available. The fastest way to get your results is to activate your My Chart account. Instructions are located on the last page of this paperwork. If you have not heard from [place] regarding the results in 2 weeks, please contact this office.

## 2022-04-22 ENCOUNTER — Other Ambulatory Visit: Payer: Self-pay | Admitting: Hematology & Oncology

## 2022-04-26 NOTE — Progress Notes (Unsigned)
NEUROLOGY FOLLOW UP OFFICE NOTE  Wendy Barber [id number]  Assessment/Plan:   Vertigo - sounds consistent with benign paroxysmal positional vertigo Burning mouth syndrome Cerebral meningioma  1  Patient due for follow up MRI to monitor meningioma.  Will order MRI of brain with and without contrast (sent to [facility] for open MRI) to also evaluate for burning tongue syndrome 2  At this time, patient declines starting medication to treat neuralgia 3  Pending MRI results, may refer to PT for vestibular rehab. 4  Otherwise, follow up in 4 months.  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  Last seen in April 2021 for short-term memory problems since 2005 following 18 hour surgery under anesthesia for disseminated adeno mucinosis appendix state IV.  Neuropsychological evaluation in June 2021 was unremarkable.  Depression and anxiety likely contributing.  Subtle cognitive difficulties following general anesthesia and acute delirium could not be ruled out..  Also endorsed 20+ year history of recurrent syncope.  Sleep deprived EEG on 02/26/2020 was normal.  MRI of brain with and without contrast on 04/06/2020 personally reviewed revealed 1.8 cm meningioma along the tentorium either invading or severely compressing the straight sinus which remained patent.  She was referred to neurosurgery who recommended monitoring but no intervention.    For about a year, she has experienced numbness on the distal half of her tongue, bottom inside lip and roof of mouth.  It also hurts if she eats anything spicy or crunchy, brush teeth or water falling in her mouth.  It is persistent.  No film or thrush over tongue or in the mouth.  No  rash on the tongue or gums except for one time several months ago she saw some whiteness on her tongue.  But that resolved.  She has a crooked bottom tooth that hurts.  It has gotten worse over the year.  Vit D level was low but otherwise labs (including B12, folate, TSH, Sjogren's antibody, Hgb A1c 6.8) were unremarkable.  For 6 months, reports dizziness described as spinning sensation upon standing, sitting or laying down.  CT head on 12/01/2021 personally reviewed showed stable posterior fossa meningioma.  It has progressively gotten worse.  Has not been found to be secondary to any of her medications.  She was recently diagnosed with left otitis media.  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[place]) 03/19/2020   Chronic fatigue syndrome    Diabetes mellitus without complication ([facility])    DVT of deep femoral vein, left ([place]) 03/19/2020   Fibromyalgia    Goals of care, counseling/discussion 03/19/2020   Hypertension    Iron deficiency anemia due to chronic blood loss 05/14/2021   Malignant pseudomyxoma peritonei ([place]) 03/19/2020   Pernicious anemia 05/14/2021   Presence of IVC filter 03/19/2020   Pulmonary embolism, bilateral ([place]) 03/19/2020    MEDICATIONS: Current Outpatient Medications on File Prior to Visit  Medication Sig Dispense Refill   buPROPion (WELLBUTRIN XL) 150  MG 24 hr tablet Take 1 tablet (150 mg total) by mouth daily. 30 tablet 1   busPIRone (BUSPAR) 15 MG tablet Take 1 tablet (15 mg total) by mouth 3 (three) times daily. 270 tablet 3   citalopram (CELEXA) 40 MG tablet Take 1 tablet (40 mg total) by mouth daily. 30 tablet 3   dicyclomine (BENTYL) 20 MG tablet Take 1 tablet (20 mg total)  by mouth 3 (three) times daily before meals. (Patient not taking: Reported on 04/21/2022) 90 tablet 3   diphenoxylate-atropine (LOMOTIL) 2.5-0.025 MG tablet TAKE 2 TABLETS IN THE MORNING, AT NOON, IN THE EVENING AND AT BEDTIME 240 tablet 3   doxycycline (VIBRA-TABS) 100 MG tablet Take 1 tablet (100 mg total) by mouth 2 (two) times daily. 20 tablet 0   eszopiclone (LUNESTA) 2 MG TABS tablet Take 1 tablet (2 mg total) by mouth at bedtime as needed for sleep. Take immediately before bedtime 30 tablet 3   famotidine (PEPCID) 20 MG tablet TAKE 2 TABLETS TWICE A DAY 360 tablet 3   loperamide (IMODIUM) 2 MG capsule Take 4 mg by mouth in the morning, at noon, in the evening, and at bedtime.     Melatonin 1 MG CHEW Chew 5 mg by mouth. (Patient not taking: Reported on 04/21/2022)     ondansetron (ZOFRAN) 8 MG tablet Take 8 mg by mouth every 8 (eight) hours as needed.     orphenadrine (NORFLEX) 100 MG tablet Take 1 tablet (100 mg total) by mouth at bedtime as needed for muscle spasms. 30 tablet 2   pantoprazole (PROTONIX) 40 MG tablet Take 40 mg by mouth every morning.     promethazine (PHENERGAN) 12.5 MG tablet Take 1 tablet (12.5 mg total) by mouth 2 (two) times daily as needed. 30 tablet 3   propranolol (INDERAL) 20 MG tablet Take 1 tablet (20 mg total) by mouth 2 (two) times daily. 180 tablet 1   XARELTO 20 MG TABS tablet TAKE 1 TABLET DAILY WITH SUPPER 30 tablet 11   No current facility-administered medications on file prior to visit.    ALLERGIES: Allergies  Allergen Reactions   Penicillins Shortness Of Breath    Other reaction(s): Irregular Heart Rate, Other (See Comments) Rapid heartrate    Alprazolam Hives and Other (See Comments)    Hard to arouse, unresponsiveness   Ativan [Lorazepam] Other (See Comments)    Note: tolerates midazolam fine Face & Throat Swelling.   Corticosteroids Other (See Comments)    Other reaction(s): Other (see comments) Psychotic behaviour     Erythromycin      Other reaction(s): Other (See Comments) Severe stomach pain    Erythromycin Base Hives   Milnacipran Hcl Other (See Comments)    Other reaction(s):  psychotic episode      Prednisone Other (See Comments)    Anxiety & Nervous Breakdown.   Savella  [Milnacipran]    Povidone Iodine Rash    Had rash under bandage after surgery , has had betadine since without reaction    Prednisolone Anxiety    FAMILY HISTORY: Family History  Problem Relation Age of Onset   Depression Mother    Drug abuse Sister    Suicidality Sister    Alcohol abuse Brother    Drug abuse Brother       Objective:  Blood pressure 138/85, pulse 67, height '5\' 6"'$  (1.676 m), weight 234 lb (106.1 kg), SpO2 95 %. General: No acute distress.  Patient appears well-groomed.   Head:  Normocephalic/atraumatic  Eyes:  Fundi examined but not visualized Neck: supple, no paraspinal tenderness, full range of motion Heart:  Regular rate and rhythm Lungs:  Clear to auscultation bilaterally Back: No paraspinal tenderness Neurological Exam: alert and oriented to person, place, and time.  Speech fluent and not dysarthric, language intact.  CN II-XII intact. Bulk and tone normal, muscle strength 5/5 throughout.  Sensation to light touch intact.  Deep tendon reflexes 2+ throughout, toes downgoing.  Finger to nose testing intact.  Gait normal, Romberg negative.   Metta Clines, DO  CC: Merri Ray, MD

## 2022-04-27 ENCOUNTER — Encounter: Payer: Self-pay | Admitting: Neurology

## 2022-04-27 ENCOUNTER — Ambulatory Visit (INDEPENDENT_AMBULATORY_CARE_PROVIDER_SITE_OTHER): Payer: Medicare Other | Admitting: Neurology

## 2022-04-27 ENCOUNTER — Other Ambulatory Visit: Payer: Self-pay

## 2022-04-27 VITALS — BP 138/85 | HR 67 | Ht 66.0 in | Wt 234.0 lb

## 2022-04-27 DIAGNOSIS — D320 Benign neoplasm of cerebral meninges: Secondary | ICD-10-CM

## 2022-04-27 DIAGNOSIS — R42 Dizziness and giddiness: Secondary | ICD-10-CM

## 2022-04-27 DIAGNOSIS — F331 Major depressive disorder, recurrent, moderate: Secondary | ICD-10-CM

## 2022-04-27 DIAGNOSIS — K146 Glossodynia: Secondary | ICD-10-CM | POA: Diagnosis not present

## 2022-04-27 DIAGNOSIS — F411 Generalized anxiety disorder: Secondary | ICD-10-CM

## 2022-04-27 MED ORDER — CITALOPRAM HYDROBROMIDE 40 MG PO TABS: 40.0000 mg | ORAL_TABLET | Freq: Every day | ORAL | 0 refills | Status: DC

## 2022-04-27 MED ORDER — BUPROPION HCL ER (XL) 150 MG PO TB24: 150.0000 mg | ORAL_TABLET | Freq: Every day | ORAL | 0 refills | Status: DC

## 2022-04-27 MED ORDER — DIAZEPAM 5 MG PO TABS: ORAL_TABLET | ORAL | 0 refills | Status: DC

## 2022-04-27 NOTE — Patient Instructions (Addendum)
Check MRI of brain with and without contrast.  Take diazepam '5mg'$  30-40 minutes prior to MRI.  Must have a driver Further recommendations pending results.  Otherwise, follow up in 4 months.

## 2022-05-03 ENCOUNTER — Emergency Department (HOSPITAL_COMMUNITY): Payer: Medicare Other

## 2022-05-03 ENCOUNTER — Inpatient Hospital Stay (HOSPITAL_COMMUNITY)
Admission: EM | Admit: 2022-05-03 | Discharge: 2022-05-08 | DRG: 378 | Disposition: A | Discharge status: Home / Self Care | Payer: Medicare Other | Attending: Internal Medicine | Admitting: Internal Medicine

## 2022-05-03 ENCOUNTER — Encounter (HOSPITAL_COMMUNITY): Payer: Self-pay

## 2022-05-03 ENCOUNTER — Other Ambulatory Visit: Payer: Self-pay

## 2022-05-03 DIAGNOSIS — Z86711 Personal history of pulmonary embolism: Secondary | ICD-10-CM | POA: Diagnosis present

## 2022-05-03 DIAGNOSIS — E119 Type 2 diabetes mellitus without complications: Secondary | ICD-10-CM | POA: Diagnosis present

## 2022-05-03 DIAGNOSIS — F419 Anxiety disorder, unspecified: Secondary | ICD-10-CM | POA: Diagnosis present

## 2022-05-03 DIAGNOSIS — Z6837 Body mass index (BMI) 37.0-37.9, adult: Secondary | ICD-10-CM

## 2022-05-03 DIAGNOSIS — D6832 Hemorrhagic disorder due to extrinsic circulating anticoagulants: Secondary | ICD-10-CM | POA: Diagnosis present

## 2022-05-03 DIAGNOSIS — Z811 Family history of alcohol abuse and dependence: Secondary | ICD-10-CM

## 2022-05-03 DIAGNOSIS — Z818 Family history of other mental and behavioral disorders: Secondary | ICD-10-CM

## 2022-05-03 DIAGNOSIS — E876 Hypokalemia: Secondary | ICD-10-CM | POA: Diagnosis present

## 2022-05-03 DIAGNOSIS — K625 Hemorrhage of anus and rectum: Principal | ICD-10-CM

## 2022-05-03 DIAGNOSIS — Z881 Allergy status to other antibiotic agents status: Secondary | ICD-10-CM

## 2022-05-03 DIAGNOSIS — C181 Malignant neoplasm of appendix: Secondary | ICD-10-CM | POA: Diagnosis present

## 2022-05-03 DIAGNOSIS — K921 Melena: Principal | ICD-10-CM | POA: Diagnosis present

## 2022-05-03 DIAGNOSIS — Z86718 Personal history of other venous thrombosis and embolism: Secondary | ICD-10-CM

## 2022-05-03 DIAGNOSIS — K649 Unspecified hemorrhoids: Secondary | ICD-10-CM | POA: Diagnosis present

## 2022-05-03 DIAGNOSIS — Z888 Allergy status to other drugs, medicaments and biological substances status: Secondary | ICD-10-CM

## 2022-05-03 DIAGNOSIS — K922 Gastrointestinal hemorrhage, unspecified: Secondary | ICD-10-CM

## 2022-05-03 DIAGNOSIS — N179 Acute kidney failure, unspecified: Secondary | ICD-10-CM | POA: Diagnosis present

## 2022-05-03 DIAGNOSIS — M1990 Unspecified osteoarthritis, unspecified site: Secondary | ICD-10-CM | POA: Diagnosis present

## 2022-05-03 DIAGNOSIS — C772 Secondary and unspecified malignant neoplasm of intra-abdominal lymph nodes: Secondary | ICD-10-CM | POA: Diagnosis present

## 2022-05-03 DIAGNOSIS — Z85038 Personal history of other malignant neoplasm of large intestine: Secondary | ICD-10-CM

## 2022-05-03 DIAGNOSIS — Z813 Family history of other psychoactive substance abuse and dependence: Secondary | ICD-10-CM

## 2022-05-03 DIAGNOSIS — Z9660 Presence of unspecified orthopedic joint implant: Secondary | ICD-10-CM | POA: Diagnosis present

## 2022-05-03 DIAGNOSIS — I10 Essential (primary) hypertension: Secondary | ICD-10-CM | POA: Diagnosis present

## 2022-05-03 DIAGNOSIS — D62 Acute posthemorrhagic anemia: Secondary | ICD-10-CM | POA: Diagnosis not present

## 2022-05-03 DIAGNOSIS — M797 Fibromyalgia: Secondary | ICD-10-CM | POA: Diagnosis present

## 2022-05-03 DIAGNOSIS — Z880 Allergy status to penicillin: Secondary | ICD-10-CM

## 2022-05-03 DIAGNOSIS — Z95828 Presence of other vascular implants and grafts: Secondary | ICD-10-CM

## 2022-05-03 DIAGNOSIS — E669 Obesity, unspecified: Secondary | ICD-10-CM | POA: Diagnosis present

## 2022-05-03 DIAGNOSIS — Z79899 Other long term (current) drug therapy: Secondary | ICD-10-CM

## 2022-05-03 LAB — COMPREHENSIVE METABOLIC PANEL
ALT: 20 U/L (ref 0–44)
AST: 28 U/L (ref 15–41)
Albumin: 3.2 g/dL — ABNORMAL LOW (ref 3.5–5.0)
Alkaline Phosphatase: 123 U/L (ref 38–126)
Anion gap: 9 (ref 5–15)
BUN: 33 mg/dL — ABNORMAL HIGH (ref 6–20)
CO2: 24 mmol/L (ref 22–32)
Calcium: 8.7 mg/dL — ABNORMAL LOW (ref 8.9–10.3)
Chloride: 105 mmol/L (ref 98–111)
Creatinine, Ser: 1.44 mg/dL — ABNORMAL HIGH (ref 0.44–1.00)
GFR, Estimated: 42 mL/min — ABNORMAL LOW (ref >60)
Glucose, Bld: 178 mg/dL — ABNORMAL HIGH (ref 70–99)
Potassium: 5 mmol/L (ref 3.5–5.1)
Sodium: 138 mmol/L (ref 135–145)
Total Bilirubin: 0.4 mg/dL (ref 0.3–1.2)
Total Protein: 7 g/dL (ref 6.5–8.1)

## 2022-05-03 LAB — CBC WITH DIFFERENTIAL/PLATELET
Abs Immature Granulocytes: 0.04 10*3/uL (ref 0.00–0.07)
Basophils Absolute: 0.1 10*3/uL (ref 0.0–0.1)
Basophils Relative: 1 %
Eosinophils Absolute: 0.2 10*3/uL (ref 0.0–0.5)
Eosinophils Relative: 2 %
HCT: 34.1 % — ABNORMAL LOW (ref 36.0–46.0)
Hemoglobin: 11 g/dL — ABNORMAL LOW (ref 12.0–15.0)
Immature Granulocytes: 0 %
Lymphocytes Relative: 30 %
Lymphs Abs: 2.9 10*3/uL (ref 0.7–4.0)
MCH: 30.1 pg (ref 26.0–34.0)
MCHC: 32.3 g/dL (ref 30.0–36.0)
MCV: 93.2 fL (ref 80.0–100.0)
Monocytes Absolute: 0.7 10*3/uL (ref 0.1–1.0)
Monocytes Relative: 7 %
Neutro Abs: 5.8 10*3/uL (ref 1.7–7.7)
Neutrophils Relative %: 60 %
Platelets: 355 10*3/uL (ref 150–400)
RBC: 3.66 MIL/uL — ABNORMAL LOW (ref 3.87–5.11)
RDW: 14.8 % (ref 11.5–15.5)
WBC: 9.7 10*3/uL (ref 4.0–10.5)
nRBC: 0 % (ref 0.0–0.2)

## 2022-05-03 LAB — TYPE AND SCREEN
ABO/RH(D): A POS
Antibody Screen: NEGATIVE

## 2022-05-03 NOTE — ED Provider Triage Note (Signed)
Emergency Medicine Provider Triage Evaluation Note  Jalena Vanderlinden , a 61 y.o. female  was evaluated in triage.  Pt complains of rectal bleed. Hx of PE/DVT currently on Xarelto.  Developed rectal bleeding today, several large episodes.   Review of Systems  Positive: As above Negative: As above  Physical Exam  BP (!) 156/93 (BP Location: Left Arm)   Pulse 79   Temp 98.9 F (37.2 C) (Oral)   Resp 18   Ht '5\' 6"'$  (1.676 m)   Wt 105.6 kg   SpO2 96%   BMI 37.56 kg/m  Gen:   Awake, no distress   Resp:  Normal effort  MSK:   Moves extremities without difficulty  Other:    Medical Decision Making  Medically screening exam initiated at 9:27 PM.  Appropriate orders placed.  Ellanore Vanhook was informed that the remainder of the evaluation will be completed by another provider, this initial triage assessment does not replace that evaluation, and the importance of remaining in the ED until their evaluation is complete.     Domenic Moras, PA-C 05/03/22 2139

## 2022-05-03 NOTE — ED Triage Notes (Addendum)
Pt presents to ED from home with c/o rectal bleeding that began today. Pt reports hx of small bowel syndrome and states she has had 2 major surgeries on her intestines. Pt reports accidentally taking 2 extra doses of xarelto. Pt endorses 2 large episodes of rectal bleeding today, reports dizziness and fatigue.

## 2022-05-04 ENCOUNTER — Encounter (HOSPITAL_COMMUNITY): Payer: Self-pay | Admitting: Family Medicine

## 2022-05-04 ENCOUNTER — Telehealth: Payer: Self-pay

## 2022-05-04 DIAGNOSIS — M1990 Unspecified osteoarthritis, unspecified site: Secondary | ICD-10-CM | POA: Diagnosis present

## 2022-05-04 DIAGNOSIS — F419 Anxiety disorder, unspecified: Secondary | ICD-10-CM | POA: Diagnosis not present

## 2022-05-04 DIAGNOSIS — Z818 Family history of other mental and behavioral disorders: Secondary | ICD-10-CM | POA: Diagnosis not present

## 2022-05-04 DIAGNOSIS — E1169 Type 2 diabetes mellitus with other specified complication: Secondary | ICD-10-CM

## 2022-05-04 DIAGNOSIS — D6832 Hemorrhagic disorder due to extrinsic circulating anticoagulants: Secondary | ICD-10-CM | POA: Diagnosis present

## 2022-05-04 DIAGNOSIS — K921 Melena: Secondary | ICD-10-CM | POA: Diagnosis present

## 2022-05-04 DIAGNOSIS — Z881 Allergy status to other antibiotic agents status: Secondary | ICD-10-CM | POA: Diagnosis not present

## 2022-05-04 DIAGNOSIS — Z811 Family history of alcohol abuse and dependence: Secondary | ICD-10-CM | POA: Diagnosis not present

## 2022-05-04 DIAGNOSIS — C772 Secondary and unspecified malignant neoplasm of intra-abdominal lymph nodes: Secondary | ICD-10-CM

## 2022-05-04 DIAGNOSIS — C181 Malignant neoplasm of appendix: Secondary | ICD-10-CM | POA: Diagnosis not present

## 2022-05-04 DIAGNOSIS — K922 Gastrointestinal hemorrhage, unspecified: Secondary | ICD-10-CM | POA: Diagnosis present

## 2022-05-04 DIAGNOSIS — E119 Type 2 diabetes mellitus without complications: Secondary | ICD-10-CM | POA: Diagnosis present

## 2022-05-04 DIAGNOSIS — M797 Fibromyalgia: Secondary | ICD-10-CM | POA: Diagnosis present

## 2022-05-04 DIAGNOSIS — Z95828 Presence of other vascular implants and grafts: Secondary | ICD-10-CM | POA: Diagnosis not present

## 2022-05-04 DIAGNOSIS — Z6837 Body mass index (BMI) 37.0-37.9, adult: Secondary | ICD-10-CM | POA: Diagnosis not present

## 2022-05-04 DIAGNOSIS — K649 Unspecified hemorrhoids: Secondary | ICD-10-CM | POA: Diagnosis present

## 2022-05-04 DIAGNOSIS — Z86718 Personal history of other venous thrombosis and embolism: Secondary | ICD-10-CM

## 2022-05-04 DIAGNOSIS — I10 Essential (primary) hypertension: Secondary | ICD-10-CM | POA: Diagnosis present

## 2022-05-04 DIAGNOSIS — Z813 Family history of other psychoactive substance abuse and dependence: Secondary | ICD-10-CM | POA: Diagnosis not present

## 2022-05-04 DIAGNOSIS — Z880 Allergy status to penicillin: Secondary | ICD-10-CM | POA: Diagnosis not present

## 2022-05-04 DIAGNOSIS — Z888 Allergy status to other drugs, medicaments and biological substances status: Secondary | ICD-10-CM | POA: Diagnosis not present

## 2022-05-04 DIAGNOSIS — Z9660 Presence of unspecified orthopedic joint implant: Secondary | ICD-10-CM | POA: Diagnosis present

## 2022-05-04 DIAGNOSIS — Z86711 Personal history of pulmonary embolism: Secondary | ICD-10-CM | POA: Diagnosis present

## 2022-05-04 DIAGNOSIS — Z85038 Personal history of other malignant neoplasm of large intestine: Secondary | ICD-10-CM | POA: Diagnosis not present

## 2022-05-04 DIAGNOSIS — N179 Acute kidney failure, unspecified: Secondary | ICD-10-CM | POA: Diagnosis present

## 2022-05-04 DIAGNOSIS — D62 Acute posthemorrhagic anemia: Secondary | ICD-10-CM | POA: Diagnosis not present

## 2022-05-04 DIAGNOSIS — E876 Hypokalemia: Secondary | ICD-10-CM | POA: Diagnosis present

## 2022-05-04 DIAGNOSIS — E669 Obesity, unspecified: Secondary | ICD-10-CM | POA: Diagnosis present

## 2022-05-04 DIAGNOSIS — Z79899 Other long term (current) drug therapy: Secondary | ICD-10-CM | POA: Diagnosis not present

## 2022-05-04 LAB — GLUCOSE, CAPILLARY
Glucose-Capillary: 145 mg/dL — ABNORMAL HIGH (ref 70–99)
Glucose-Capillary: 129 mg/dL — ABNORMAL HIGH (ref 70–99)
Glucose-Capillary: 109 mg/dL — ABNORMAL HIGH (ref 70–99)
Glucose-Capillary: 110 mg/dL — ABNORMAL HIGH (ref 70–99)
Glucose-Capillary: 140 mg/dL — ABNORMAL HIGH (ref 70–99)

## 2022-05-04 LAB — BASIC METABOLIC PANEL
Anion gap: 6 (ref 5–15)
BUN: 36 mg/dL — ABNORMAL HIGH (ref 6–20)
CO2: 28 mmol/L (ref 22–32)
Calcium: 8.2 mg/dL — ABNORMAL LOW (ref 8.9–10.3)
Chloride: 105 mmol/L (ref 98–111)
Creatinine, Ser: 1.2 mg/dL — ABNORMAL HIGH (ref 0.44–1.00)
GFR, Estimated: 52 mL/min — ABNORMAL LOW (ref >60)
Glucose, Bld: 143 mg/dL — ABNORMAL HIGH (ref 70–99)
Potassium: 4.5 mmol/L (ref 3.5–5.1)
Sodium: 139 mmol/L (ref 135–145)

## 2022-05-04 LAB — URINALYSIS, ROUTINE W REFLEX MICROSCOPIC
Bilirubin Urine: NEGATIVE
Glucose, UA: NEGATIVE mg/dL
Ketones, ur: NEGATIVE mg/dL
Nitrite: NEGATIVE
Protein, ur: NEGATIVE mg/dL
Specific Gravity, Urine: 1.025 (ref 1.005–1.030)
pH: 5 (ref 5.0–8.0)

## 2022-05-04 LAB — HEMATOCRIT
HCT: 26.6 % — ABNORMAL LOW (ref 36.0–46.0)
HCT: 24.2 % — ABNORMAL LOW (ref 36.0–46.0)
HCT: 25.2 % — ABNORMAL LOW (ref 36.0–46.0)
HCT: 24.3 % — ABNORMAL LOW (ref 36.0–46.0)

## 2022-05-04 LAB — HEMOGLOBIN
Hemoglobin: 8.5 g/dL — ABNORMAL LOW (ref 12.0–15.0)
Hemoglobin: 7.9 g/dL — ABNORMAL LOW (ref 12.0–15.0)
Hemoglobin: 8.2 g/dL — ABNORMAL LOW (ref 12.0–15.0)
Hemoglobin: 7.8 g/dL — ABNORMAL LOW (ref 12.0–15.0)

## 2022-05-04 LAB — ABO/RH: ABO/RH(D): A POS

## 2022-05-04 LAB — POC OCCULT BLOOD, ED: Fecal Occult Bld: POSITIVE — AB

## 2022-05-04 LAB — HIV ANTIBODY (ROUTINE TESTING W REFLEX): HIV Screen 4th Generation wRfx: NONREACTIVE

## 2022-05-04 MED ORDER — PANTOPRAZOLE SODIUM 40 MG PO TBEC
40.0000 mg | DELAYED_RELEASE_TABLET | Freq: Every morning | ORAL | Status: DC
  Administered 2022-05-04 – 2022-05-08 (×5): 40 mg via ORAL
  Filled 2022-05-04 (×5): qty 1

## 2022-05-04 MED ORDER — INSULIN ASPART 100 UNIT/ML IJ SOLN
0.0000 [IU] | INTRAMUSCULAR | Status: DC
  Administered 2022-05-04 – 2022-05-07 (×3): 1 [IU] via SUBCUTANEOUS

## 2022-05-04 MED ORDER — SODIUM CHLORIDE 0.9 % IV SOLN: INTRAVENOUS | Status: AC

## 2022-05-04 MED ORDER — SODIUM CHLORIDE 0.9 % IV BOLUS
1000.0000 mL | Freq: Once | INTRAVENOUS | Status: AC
  Administered 2022-05-04: 1000 mL via INTRAVENOUS

## 2022-05-04 MED ORDER — PROPRANOLOL HCL 20 MG PO TABS
20.0000 mg | ORAL_TABLET | Freq: Two times a day (BID) | ORAL | Status: DC
  Administered 2022-05-04 – 2022-05-08 (×9): 20 mg via ORAL
  Filled 2022-05-04 (×9): qty 1

## 2022-05-04 MED ORDER — MELATONIN 3 MG PO TABS
3.0000 mg | ORAL_TABLET | Freq: Once | ORAL | Status: AC
  Administered 2022-05-04: 3 mg via ORAL
  Filled 2022-05-04: qty 1

## 2022-05-04 MED ORDER — SODIUM CHLORIDE 0.9% FLUSH
3.0000 mL | Freq: Two times a day (BID) | INTRAVENOUS | Status: DC
Start: 2022-05-04 — End: 2022-05-08
  Administered 2022-05-04 – 2022-05-08 (×7): 3 mL via INTRAVENOUS

## 2022-05-04 MED ORDER — BUSPIRONE HCL 5 MG PO TABS
15.0000 mg | ORAL_TABLET | Freq: Three times a day (TID) | ORAL | Status: DC
  Administered 2022-05-04 – 2022-05-08 (×12): 15 mg via ORAL
  Filled 2022-05-04 (×12): qty 1

## 2022-05-04 MED ORDER — BUPROPION HCL ER (XL) 150 MG PO TB24
150.0000 mg | ORAL_TABLET | Freq: Every day | ORAL | Status: DC
  Administered 2022-05-04 – 2022-05-08 (×5): 150 mg via ORAL
  Filled 2022-05-04 (×5): qty 1

## 2022-05-04 NOTE — ED Provider Notes (Signed)
[facility] DEPT Provider Note   CSN: [id number] Arrival date & time: 05/03/22  2044     History  Chief Complaint  Patient presents with   Rectal Bleeding    Wendy Barber is a 61 y.o. female.   Rectal Bleeding   Patient with medical history of malignant pseudomyxoma peritonei, PEs on Xarelto, pernicious anemia, autoimmune hepatitis, diabetes, hypertension, chronic fatigue syndrome presents today due to rectal bleeding.  Patient states she has been feeling lightheaded today especially when she goes from sitting to standing.  States she was tired yesterday and switch her to a nighttime dose.  She has had 3 doses of her Xarelto in the last 24 hours by accident.  She started having bright red bleeding earlier today with some clots.  This has been progressively worsening throughout the day.  Surgical history is notable for multiple bowel resections, appendectomy, abdominal hysterectomy, cholecystectomy.  Home Medications Prior to Admission medications   Medication Sig Start Date End Date Taking? Authorizing Provider  buPROPion (WELLBUTRIN XL) 150 MG 24 hr tablet Take 1 tablet (150 mg total) by mouth daily. 04/27/22   Elwanda Brooklyn, NP  busPIRone (BUSPAR) 15 MG tablet Take 1 tablet (15 mg total) by mouth 3 (three) times daily. 04/05/22   Elwanda Brooklyn, NP  citalopram (CELEXA) 40 MG tablet Take 1 tablet (40 mg total) by mouth daily. 04/27/22   Elwanda Brooklyn, NP  diazepam (VALIUM) 5 MG tablet Take 1 tablet 30-40 minutes prior to MRI 04/27/22   Pieter Partridge, DO  dicyclomine (BENTYL) 20 MG tablet Take 1 tablet (20 mg total) by mouth 3 (three) times daily before meals. 03/02/22   Volanda Napoleon, MD  diphenoxylate-atropine (LOMOTIL) 2.5-0.025 MG tablet TAKE 2 TABLETS IN THE MORNING, AT NOON, IN THE EVENING AND AT BEDTIME 04/22/22   Ennever, Rudell Cobb, MD  doxycycline (VIBRA-TABS) 100 MG tablet Take 1 tablet (100 mg total) by mouth 2 (two) times daily. 04/21/22    [name], MD  eszopiclone (LUNESTA) 2 MG TABS tablet Take 1 tablet (2 mg total) by mouth at bedtime as needed for sleep. Take immediately before bedtime 11/22/21   Lesle Chris A, NP  famotidine (PEPCID) 20 MG tablet TAKE 2 TABLETS TWICE A DAY 03/01/22   Volanda Napoleon, MD  loperamide (IMODIUM) 2 MG capsule Take 4 mg by mouth in the morning, at noon, in the evening, and at bedtime.    [provider]  Melatonin 1 MG CHEW Chew 5 mg by mouth. Patient not taking: Reported on 04/21/2022    [provider]  ondansetron (ZOFRAN) 8 MG tablet Take 8 mg by mouth every 8 (eight) hours as needed. 03/03/21   [provider]  orphenadrine (NORFLEX) 100 MG tablet Take 1 tablet (100 mg total) by mouth at bedtime as needed for muscle spasms. 09/29/21   Volanda Napoleon, MD  pantoprazole (PROTONIX) 40 MG tablet Take 40 mg by mouth every morning. 01/01/21   [provider]  promethazine (PHENERGAN) 12.5 MG tablet Take 1 tablet (12.5 mg total) by mouth 2 (two) times daily as needed. 04/21/22   [name], MD  propranolol (INDERAL) 20 MG tablet Take 1 tablet (20 mg total) by mouth 2 (two) times daily. 04/21/22   [name], MD  XARELTO 20 MG TABS tablet TAKE 1 TABLET DAILY WITH SUPPER 03/02/22   Volanda Napoleon, MD      Allergies    Penicillins, Alprazolam,  Ativan [lorazepam], Corticosteroids, Erythromycin, Erythromycin base, Milnacipran hcl, Prednisone, Savella  [milnacipran], Povidone iodine, and Prednisolone    Review of Systems   Review of Systems  Gastrointestinal:  Positive for hematochezia.    Physical Exam Updated Vital Signs BP (!) 142/85   Pulse 60   Temp 98.9 F (37.2 C) (Oral)   Resp 17   Ht '5\' 6"'$  (1.676 m)   Wt 105.6 kg   SpO2 100%   BMI 37.56 kg/m  Physical Exam Vitals and nursing note reviewed. Exam conducted with a chaperone present.  Constitutional:      Appearance: Normal appearance.  HENT:     Head: Normocephalic and  atraumatic.  Eyes:     General: No scleral icterus.       Right eye: No discharge.        Left eye: No discharge.     Extraocular Movements: Extraocular movements intact.     Pupils: Pupils are equal, round, and reactive to light.  Cardiovascular:     Rate and Rhythm: Normal rate and regular rhythm.     Pulses: Normal pulses.     Heart sounds: Normal heart sounds. No murmur heard.    No friction rub. No gallop.  Pulmonary:     Effort: Pulmonary effort is normal. No respiratory distress.     Breath sounds: Normal breath sounds.  Abdominal:     General: Abdomen is flat. Bowel sounds are normal. There is no distension.     Palpations: Abdomen is soft.     Tenderness: There is no abdominal tenderness.  Skin:    General: Skin is warm and dry.     Coloration: Skin is not jaundiced.  Neurological:     Mental Status: She is alert. Mental status is at baseline.     Coordination: Coordination normal.     ED Results / Procedures / Treatments   Labs (all labs ordered are listed, but only abnormal results are displayed) Labs Reviewed  CBC WITH DIFFERENTIAL/PLATELET - Abnormal; Notable for the following components:      Result Value   RBC 3.66 (*)    Hemoglobin 11.0 (*)    HCT 34.1 (*)    All other components within normal limits  COMPREHENSIVE METABOLIC PANEL - Abnormal; Notable for the following components:   Glucose, Bld 178 (*)    BUN 33 (*)    Creatinine, Ser 1.44 (*)    Calcium 8.7 (*)    Albumin 3.2 (*)    GFR, Estimated 42 (*)    All other components within normal limits  URINALYSIS, ROUTINE W REFLEX MICROSCOPIC  OCCULT BLOOD X 1 CARD TO LAB, STOOL  TYPE AND SCREEN  ABO/RH    EKG None  Radiology No results found.  Procedures Procedures    Medications Ordered in ED Medications - No data to display  ED Course/ Medical Decision Making/ A&P                           Medical Decision Making Amount and/or Complexity of Data Reviewed Labs:  ordered.  Risk Decision regarding hospitalization.   Patient presents due to GI bleed.  Differential includes but not limited to GI bleed on Xarelto, gastric ulcer, diverticular bleed, septic shock, hemorrhagic shock, hypotension.  On exam patient is not hypotensive or tachycardic which is somewhat reassuring.  There is bright red blood on rectal exam and no actively bleeding hemorrhoids.  Her abdomen is soft without any rigidity.  No leukocytosis, patient is anemic with a hemoglobin of 11.0.  Creatinine is slightly elevated at 1.44, BUN is also elevated at 33.  Fecal occult is positive.  Type and screen pending.  Patient will need admission due to GI bleed on Xarelto.  This seems to be most likely a diverticular bleed, consider getting CT angio I do not think would be particularly beneficial after discussing with my attending.  I will consult hospitalist for admission.        Final Clinical Impression(s) / ED Diagnoses Final diagnoses:  None    Rx / DC Orders ED Discharge Orders     None         Sherrill Raring, PA-C 05/04/22 0216    Merryl Hacker, MD 05/04/22 [phone]

## 2022-05-04 NOTE — Telephone Encounter (Signed)
Received message from Nurse access line stating pt overtook medication and is bleeding from rectum, bright red blood and clots. Notes took her morning medication last night and both her morning and evening meds by mistake this morning.  Feels weak and dizzy  Takes Xarelto did take an additional dose   Please advise,  went to ED yesterday

## 2022-05-04 NOTE — H&P (Signed)
History and Physical    Wendy Barber WEX:[id number] DOB: Sep 29, 1961 DOA: 05/03/2022  PCP: [name], MD   Patient coming from: Home   Chief Complaint: Rectal bleeding   HPI: Wendy Barber is a pleasant 61 y.o. female with medical history significant for metastatic appendiceal cancer, DVT and PE on Xarelto, type 2 diabetes mellitus, hypertension, and anxiety, now presenting to the emergency department with rectal bleeding.  Patient reports that she was feeling fatigued and lightheaded on standing yesterday evening, felt as though she was going to have diarrhea, but noted bright red blood and clots mixed with stool in the toilet bowl.  Approximately 45 minutes later, she had a second episode that was mainly red blood and clot.  She denies any abdominal pain, nausea, or vomiting.  She has never had this previously.  Last colonoscopy was a few years ago in [place].   ED Course: Upon arrival to the ED, patient is found to be afebrile and saturating well on room air with normal heart rate and blood pressure.  Chemistry panel notable for creatinine 1.44 and CBC features a hemoglobin of 11.0.  Fecal occult blood test is positive.  Type and screen was performed.  Review of Systems:  All other systems reviewed and apart from HPI, are negative.  Past Medical History:  Diagnosis Date   Arthritis    Back pain    Cancer [facility])    pseudomyxoma peritonei   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[place]) 03/19/2020   Chronic fatigue syndrome    Diabetes mellitus without complication (HCC)    DVT of deep femoral vein, left ([place]) 03/19/2020   Fibromyalgia    Goals of care, counseling/discussion 03/19/2020   Hypertension    Iron deficiency anemia due to chronic blood loss 05/14/2021   Malignant pseudomyxoma peritonei ([place]) 03/19/2020   Pernicious anemia 05/14/2021   Presence of IVC filter 03/19/2020   Pulmonary embolism, bilateral ([place]) 03/19/2020    Past Surgical  History:  Procedure Laterality Date   ABDOMINAL HYSTERECTOMY     ABDOMINAL SURGERY     ACHILLES TENDON REPAIR     APPENDECTOMY     ARTHROPLASTY     CARPAL TUNNEL RELEASE     CHOLECYSTECTOMY     IR CATHETER TUBE CHANGE  02/02/2021   IR CATHETER TUBE CHANGE  02/25/2021   IR RADIOLOGIST EVAL & MGMT  02/24/2021   IR RADIOLOGIST EVAL & MGMT  03/10/2021   IR US GUIDE BX ASP/DRAIN  11/25/2019   JOINT REPLACEMENT     KNEE ARTHROSCOPY     ORIF ANKLE FRACTURE Right 08/27/2019   Procedure: OPEN REDUCTION INTERNAL FIXATION RIGHT ANKLE FRACTURE;  Surgeon: Renette Butters, MD;  Location: [facility];  Service: Orthopedics;  Laterality: Right;   perineorrophy     TONGUE BIOPSY      Social History:   reports that she has never smoked. She has never used smokeless tobacco. She reports that she does not drink alcohol and does not use drugs.  Allergies  Allergen Reactions   Penicillins Shortness Of Breath    Other reaction(s): Irregular Heart Rate, Other (See Comments) Rapid heartrate    Alprazolam Hives and Other (See Comments)    Hard to arouse, unresponsiveness   Ativan [Lorazepam] Other (See Comments)    Note: tolerates midazolam fine Face & Throat Swelling.   Corticosteroids Other (See Comments)    Other reaction(s): Other (see comments) Psychotic behaviour     Erythromycin  Other reaction(s): Other (See Comments) Severe stomach pain    Prednisone Other (See Comments)    Anxiety & Nervous Breakdown.   Savella  [Milnacipran]    Povidone Iodine Rash    Had rash under bandage after surgery , has had betadine since without reaction    Prednisolone Anxiety    Family History  Problem Relation Age of Onset   Depression Mother    Drug abuse Sister    Suicidality Sister    Alcohol abuse Brother    Drug abuse Brother      Prior to Admission medications   Medication Sig Start Date End Date Taking? Authorizing Provider  buPROPion (WELLBUTRIN XL) 150 MG 24 hr tablet Take 1 tablet (150 mg  total) by mouth daily. 04/27/22  Yes White, Aaron Edelman A, NP  busPIRone (BUSPAR) 15 MG tablet Take 1 tablet (15 mg total) by mouth 3 (three) times daily. 04/05/22  Yes Lesle Chris A, NP  citalopram (CELEXA) 40 MG tablet Take 1 tablet (40 mg total) by mouth daily. 04/27/22  Yes Elwanda Brooklyn, NP  diazepam (VALIUM) 5 MG tablet Take 1 tablet 30-40 minutes prior to MRI 04/27/22  Yes Jaffe, Adam R, DO  dicyclomine (BENTYL) 20 MG tablet Take 1 tablet (20 mg total) by mouth 3 (three) times daily before meals. 03/02/22  Yes Ennever, Rudell Cobb, MD  diphenoxylate-atropine (LOMOTIL) 2.5-0.025 MG tablet TAKE 2 TABLETS IN THE MORNING, AT NOON, IN THE EVENING AND AT BEDTIME 04/22/22  Yes Ennever, Rudell Cobb, MD  eszopiclone (LUNESTA) 2 MG TABS tablet Take 1 tablet (2 mg total) by mouth at bedtime as needed for sleep. Take immediately before bedtime 11/22/21  Yes White, Brian A, NP  famotidine (PEPCID) 20 MG tablet TAKE 2 TABLETS TWICE A DAY Patient taking differently: Take 40 mg by mouth 2 (two) times daily. 03/01/22  Yes Volanda Napoleon, MD  loperamide (IMODIUM) 2 MG capsule Take 4 mg by mouth in the morning, at noon, in the evening, and at bedtime.   Yes [provider]  ondansetron (ZOFRAN) 8 MG tablet Take 8 mg by mouth every 8 (eight) hours as needed for nausea. 03/03/21  Yes [provider]  orphenadrine (NORFLEX) 100 MG tablet Take 1 tablet (100 mg total) by mouth at bedtime as needed for muscle spasms. 09/29/21  Yes Volanda Napoleon, MD  pantoprazole (PROTONIX) 40 MG tablet Take 40 mg by mouth every morning. 01/01/21  Yes [provider]  promethazine (PHENERGAN) 12.5 MG tablet Take 1 tablet (12.5 mg total) by mouth 2 (two) times daily as needed. Patient taking differently: Take 12.5 mg by mouth 2 (two) times daily as needed for nausea. 04/21/22  Yes [name], MD  propranolol (INDERAL) 20 MG tablet Take 1 tablet (20 mg total) by mouth 2 (two) times daily. 04/21/22  Yes [name], MD   valsartan-hydrochlorothiazide (DIOVAN-HCT) 160-12.5 MG tablet Take 1 tablet by mouth daily. 03/02/22  Yes [provider]  XARELTO 20 MG TABS tablet TAKE 1 TABLET DAILY WITH SUPPER Patient taking differently: Take 20 mg by mouth in the morning. 03/02/22  Yes Ennever, Rudell Cobb, MD  doxycycline (VIBRA-TABS) 100 MG tablet Take 1 tablet (100 mg total) by mouth 2 (two) times daily. Patient not taking: Reported on 05/04/2022 04/21/22   [name], MD  Melatonin 1 MG CHEW Chew 5 mg by mouth. Patient not taking: Reported on 05/04/2022    [provider]    Physical Exam: Vitals:  05/04/22 0100 05/04/22 0201 05/04/22 0202 05/04/22 0259  BP: 135/82 138/80  131/75  Pulse: 61  67 79  Resp: '16 16  18  '$ Temp:  98.3 F (36.8 C)  98.2 F (36.8 C)  TempSrc:  Oral  Oral  SpO2: 97%  98% 100%  Weight:      Height:        Constitutional: NAD, calm  Eyes: PERTLA, lids and conjunctivae normal ENMT: Mucous membranes are moist. Posterior pharynx clear of any exudate or lesions.   Neck: supple, no masses  Respiratory:  no wheezing, no crackles. No accessory muscle use.  Cardiovascular: S1 & S2 heard, regular rate and rhythm.  No significant JVD. Abdomen: No distension, no tenderness, soft. Bowel sounds active.  Musculoskeletal: no clubbing / cyanosis. No joint deformity upper and lower extremities.   Skin: no significant rashes, lesions, ulcers. Warm, dry, well-perfused. Neurologic: CN 2-12 grossly intact. Moving all extremities. Alert and oriented.  Psychiatric: Pleasant. Cooperative.    Labs and Imaging on Admission: I have personally reviewed following labs and imaging studies  CBC: Recent Labs  Lab 05/03/22 2126  WBC 9.7  NEUTROABS 5.8  HGB 11.0*  HCT 34.1*  MCV 93.2  PLT 283   Basic Metabolic Panel: Recent Labs  Lab 05/03/22 2126  NA 138  K 5.0  CL 105  CO2 24  GLUCOSE 178*  BUN 33*  CREATININE 1.44*  CALCIUM 8.7*   GFR: Estimated Creatinine Clearance:  51.7 mL/min (A) (by C-G formula based on SCr of 1.44 mg/dL (H)). Liver Function Tests: Recent Labs  Lab 05/03/22 2126  AST 28  ALT 20  ALKPHOS 123  BILITOT 0.4  PROT 7.0  ALBUMIN 3.2*   No results for input(s): "LIPASE", "AMYLASE" in the last 168 hours. No results for input(s): "AMMONIA" in the last 168 hours. Coagulation Profile: No results for input(s): "INR", "PROTIME" in the last 168 hours. Cardiac Enzymes: No results for input(s): "CKTOTAL", "CKMB", "CKMBINDEX", "TROPONINI" in the last 168 hours. BNP (last 3 results) No results for input(s): "PROBNP" in the last 8760 hours. HbA1C: No results for input(s): "HGBA1C" in the last 72 hours. CBG: No results for input(s): "GLUCAP" in the last 168 hours. Lipid Profile: No results for input(s): "CHOL", "HDL", "LDLCALC", "TRIG", "CHOLHDL", "LDLDIRECT" in the last 72 hours. Thyroid Function Tests: No results for input(s): "TSH", "T4TOTAL", "FREET4", "T3FREE", "THYROIDAB" in the last 72 hours. Anemia Panel: No results for input(s): "VITAMINB12", "FOLATE", "FERRITIN", "TIBC", "IRON", "RETICCTPCT" in the last 72 hours. Urine analysis:    Component Value Date/Time   COLORURINE RED (A) 05/03/2022 2134   APPEARANCEUR CLOUDY (A) 05/03/2022 2134   LABSPEC 1.025 05/03/2022 2134   PHURINE 5.0 05/03/2022 2134   GLUCOSEU NEGATIVE 05/03/2022 2134   GLUCOSEU NEGATIVE 11/29/2021 1517   HGBUR LARGE (A) 05/03/2022 2134   BILIRUBINUR NEGATIVE 05/03/2022 2134   KETONESUR NEGATIVE 05/03/2022 2134   PROTEINUR NEGATIVE 05/03/2022 2134   UROBILINOGEN 0.2 11/29/2021 1517   NITRITE NEGATIVE 05/03/2022 2134   LEUKOCYTESUR LARGE (A) 05/03/2022 2134   Sepsis Labs: '@LABRCNTIP'$ (procalcitonin:4,lacticidven:4) )No results found for this or any previous visit (from the past 240 hour(s)).   Radiological Exams on Admission: No results found.   Assessment/Plan   1. Acute lower GI bleed - Presents with recurrent episodes of painless hematochezia    -  She reports lightheadedness but is hemodynamically stable  - Initial Hgb is 11.0, down from 12.0 in May 2023  - Hold Xarelo, trend H&H, transfuse if needed, consult  GI   2. AKI  - SCr is 1.44 on admission, up from 0.94 in May 2023  - Likely acute prerenal injury in setting of GIB  - Start IVF hydration, transfuse as needed, renally-dose medications, repeat chem panel    3. Hx of DVT and PE  - Hold Xarelto    4. Appendiceal cancer  - Hx of pseudomyxoma peritonei s/p major resections and HIPEC in [place] in 2005, and local recurrence s/p resections and HIPEC in Feb 2022  - Following with Dr. [name] locally     5. Type II DM  - A1c was 6.8% in February 2023  - Check CBGs and use low-intensity SSI for now     DVT prophylaxis: SCDs  Code Status: Full  Level of Care: Level of care: Telemetry Family Communication: husband at bedside  Disposition Plan: Patient is from: home  Anticipated d/c is to: home  Anticipated d/c date is: 05/07/22  Patient currently: Pending resolution of bleeding  Consults called: Secure chat sent to Dr. Paulita Fujita of [facility] with request for routine am consult   Admission status: Inpatient     Vianne Bulls, MD [facility]  05/04/2022, 3:09 AM

## 2022-05-04 NOTE — Progress Notes (Signed)
  Progress Note   Patient: Wendy Barber ZOX:[id number] DOB: September 07, 1961 DOA: 05/03/2022     0 DOS: the patient was seen and examined on 05/04/2022   Brief hospital course: 61 y.o. female with medical history significant for metastatic appendiceal cancer, DVT and PE on Xarelto, type 2 diabetes mellitus, hypertension, and anxiety, now presenting to the emergency department with rectal bleeding.  Patient reports ample amounts of BRBPR with increased fatigue and dizziness.  Assessment and Plan: 1. Acute lower GI bleed - Presents with recurrent episodes of painless hematochezia    - She reported increased fatigue and dizziness - Initial Hgb is 11.0, down to 7.9 this afternoon - [employer] -GI consulted with recs to consider CTA vs bleeding scan if continued bleeding -Cont to follow CBC trends   2. AKI  - SCr is 1.44 on admission, up from 0.94 in May 2023  - Likely acute prerenal injury in setting of GIB  - Continued on IVF hydration with improvement in Cr -Repeat bmet in AM   3. Hx of DVT and PE  - Hold Xarelto per above   4. Appendiceal cancer  - Hx of pseudomyxoma peritonei s/p major resections and HIPEC in [place] in 2005, and local recurrence s/p resections and HIPEC in Feb 2022  - Following with Dr. [name] locally      5. Type II DM  - A1c was 6.8% in February 2023  - Cont SSI coverage as needed -Glycemic trends stable       Subjective: Complaining of dizziness this AM. Still having BRBPR  Physical Exam: Vitals:   05/04/22 0259 05/04/22 0630 05/04/22 1403 05/04/22 1404  BP: 131/75 121/73 (!) 141/75 140/75  Pulse: 79 69 70   Resp: 18 18    Temp: 98.2 F (36.8 C) 98 F (36.7 C) 98.6 F (37 C)   TempSrc: Oral Oral Oral   SpO2: 100% 97% 97%   Weight:      Height:       General exam: Awake, laying in bed, in nad Respiratory system: Normal respiratory effort, no wheezing Cardiovascular system: regular rate, s1, s2 Gastrointestinal system: Soft, nondistended,  positive BS Central nervous system: CN2-12 grossly intact, strength intact Extremities: Perfused, no clubbing Skin: Normal skin turgor, no notable skin lesions seen Psychiatry: Mood normal // no visual hallucinations   Data Reviewed:  Labs reviewed: Cr 1.2, Hgb 7.9  Family Communication: Pt in room, family not at bedside  Disposition: Status is: Inpatient Remains inpatient appropriate because: Severity of illness  Planned Discharge Destination: Home    Author: Marylu Lund, MD 05/04/2022 4:24 PM  For on call review [email or web address].

## 2022-05-04 NOTE — Hospital Course (Signed)
61 y.o. female with medical history significant for metastatic appendiceal cancer, DVT and PE on Xarelto, type 2 diabetes mellitus, hypertension, and anxiety, now presenting to the emergency department with rectal bleeding.  Patient reports ample amounts of BRBPR with increased fatigue and dizziness.

## 2022-05-04 NOTE — Consult Note (Addendum)
Referring Provider: [facility] Primary Care Physician:  [name], MD Primary Gastroenterologist:  unassigned  Reason for Consultation:  BRBPR  HPI: Wendy Barber is a 61 y.o. female with medical history significant for metastatic appendiceal cancer, DVT and PE on Xarelto, type 2 diabetes mellitus, hypertension, and anxiety presents for evaluation of bright red rectal bleeding.  Patient originally presented to ED with fatigue and lightheadedness.  States yesterday she woke up not feeling well and when she went to the bathroom she had dark red blood that filled the toilet bowl. Patient continued to have intermittent rectal bleeding and proceeded to ED. 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 Has lost a total of 100lbs since HIPEC procedure due to chronic diarrhea. Denies abdominal pain. Reports some nausea but no vomiting. Has not had an occurrence of rectal bleeding like this in the past. States she would sometime see a small amount of bright red blood on wiping but nothing this significant. NSAIDs PRN. Denies alcohol and tobacco. Denies family history of colon cancer or other GI malignancies. States she accidentally took an extra dose of her Xarelto yesterday making 3 doses in 24hrs.  EGD done at outside hospital in [facility] 08/2020 to evaluate for varices with suspected portal hypertension: Normal esophagus, stomach, duodenum.  Small hiatal hernia.  Repeat in 3 years.  Patient states she had a colonoscopy done 18 months ago prior to HIPEC and states it did not show polyps, showed internal hemorrhoids  States prior to that she had a colonoscopy 3-4 years ago in [place] that also did not show polyps. 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   Pernicious anemia 05/14/2021   Presence of IVC filter 03/19/2020   Pulmonary embolism, bilateral ([place]) 03/19/2020    Past Surgical History:  Procedure Laterality Date   ABDOMINAL HYSTERECTOMY     ABDOMINAL SURGERY     ACHILLES TENDON REPAIR     APPENDECTOMY     ARTHROPLASTY     CARPAL TUNNEL RELEASE     CHOLECYSTECTOMY     IR CATHETER TUBE CHANGE  02/02/2021   IR CATHETER TUBE CHANGE  02/25/2021   IR RADIOLOGIST EVAL & MGMT  02/24/2021   IR RADIOLOGIST EVAL & MGMT  03/10/2021   IR US GUIDE BX ASP/DRAIN  11/25/2019   JOINT REPLACEMENT     KNEE ARTHROSCOPY     ORIF ANKLE FRACTURE Right 08/27/2019   Procedure: OPEN REDUCTION INTERNAL FIXATION RIGHT ANKLE FRACTURE;  Surgeon: Renette Butters, MD;  Location: [facility];  Service: Orthopedics;  Laterality: Right;   perineorrophy     TONGUE BIOPSY      Prior to Admission medications   Medication Sig Start Date End Date Taking? Authorizing Provider  buPROPion (WELLBUTRIN XL) 150 MG 24 hr tablet Take 1 tablet (150 mg total) by mouth daily. 04/27/22  Yes White, Aaron Edelman A, NP  busPIRone (BUSPAR) 15 MG tablet  Take 1 tablet (15 mg total) by mouth 3 (three) times daily. 04/05/22  Yes Lesle Chris A, NP  citalopram (CELEXA) 40 MG tablet Take 1 tablet (40 mg total) by mouth daily. 04/27/22  Yes Elwanda Brooklyn, NP  diazepam (VALIUM) 5 MG tablet Take 1 tablet 30-40 minutes prior to MRI 04/27/22  Yes Jaffe, Adam R, DO  dicyclomine (BENTYL) 20 MG tablet Take 1 tablet (20 mg total) by mouth 3 (three) times daily before meals. Patient taking differently: Take 20 mg by mouth in the morning, at noon, in the evening, and at bedtime. 03/02/22  Yes Ennever, Rudell Cobb, MD  diphenoxylate-atropine (LOMOTIL) 2.5-0.025 MG tablet TAKE 2 TABLETS IN THE MORNING, AT NOON, IN THE EVENING AND AT BEDTIME Patient taking differently: Take 2 tablets by mouth 4 (four) times daily. 04/22/22  Yes Ennever, Rudell Cobb, MD  eszopiclone (LUNESTA) 2 MG TABS tablet Take 1 tablet (2 mg total) by mouth at bedtime as needed for sleep. Take immediately before bedtime 11/22/21  Yes White, Brian A, NP  famotidine (PEPCID) 20 MG tablet TAKE 2 TABLETS TWICE A DAY Patient taking differently: Take 40 mg by mouth 2 (two) times daily. 03/01/22  Yes Volanda Napoleon, MD  loperamide (IMODIUM) 2 MG capsule Take 4 mg by mouth in the morning, at noon, in the evening, and at bedtime.   Yes [provider]  ondansetron (ZOFRAN) 8 MG tablet Take 8 mg by mouth every 8 (eight) hours as needed for nausea. 03/03/21  Yes [provider]  orphenadrine (NORFLEX) 100 MG tablet Take 1 tablet (100 mg total) by mouth at bedtime as needed for muscle spasms. 09/29/21  Yes Volanda Napoleon, MD  pantoprazole (PROTONIX) 40 MG tablet Take 40 mg by mouth every morning. 01/01/21  Yes [provider]  promethazine (PHENERGAN) 12.5 MG tablet Take 1 tablet (12.5 mg total) by mouth 2 (two) times daily as needed. Patient taking differently: Take 12.5 mg by mouth 2 (two) times daily as needed for nausea. 04/21/22  Yes [name], MD  propranolol (INDERAL) 20 MG tablet Take 1 tablet (20 mg total) by mouth 2 (two) times daily. 04/21/22  Yes [name], MD  valsartan-hydrochlorothiazide (DIOVAN-HCT) 160-12.5 MG tablet Take 1 tablet by mouth daily. 03/02/22  Yes [provider]  XARELTO 20 MG TABS tablet TAKE 1 TABLET DAILY WITH SUPPER Patient taking differently: Take 20 mg by mouth in the morning. 03/02/22  Yes Ennever, Rudell Cobb, MD  doxycycline (VIBRA-TABS) 100 MG tablet Take 1 tablet (100 mg total) by mouth 2 (two) times daily. Patient not taking: Reported on 05/04/2022 04/21/22   [name], MD  Melatonin 1 MG CHEW Chew 5 mg by mouth. Patient not taking: Reported on 05/04/2022    [provider]    Scheduled Meds:  buPROPion  150 mg Oral Daily   busPIRone  15 mg Oral TID   insulin aspart  0-6 Units Subcutaneous Q4H   pantoprazole  40 mg Oral q morning   propranolol  20 mg Oral BID   sodium chloride flush  3 mL Intravenous Q12H   Continuous Infusions: PRN Meds:.  Allergies as of 05/03/2022 - Review Complete 05/03/2022  Allergen Reaction Noted   Penicillins Shortness Of Breath 01/06/2012   Alprazolam Hives and Other (See Comments) 01/06/2012   Ativan [lorazepam] Other (See Comments) 10/24/2014   Corticosteroids Other (See Comments) 09/24/2015   Erythromycin  06/10/2011   Erythromycin base Hives 10/09/2015  Milnacipran hcl Other (See Comments) 01/06/2012   Prednisone Other (See Comments) 10/24/2014   [place]  [milnacipran]  11/21/2019   Povidone iodine Rash 01/06/2012   Prednisolone Anxiety 06/04/2017    Family History  Problem Relation Age of Onset   Depression Mother    Drug abuse Sister    Suicidality Sister    Alcohol abuse Brother    Drug abuse Brother     Social History   Socioeconomic History   Marital status: Married    Spouse name: Not on file   Number of children: 3   Years of education: Not on file   Highest education level: Some college, no degree  Occupational History   Occupation: disability  Tobacco Use   Smoking status: Never   Smokeless tobacco: Never  Vaping Use   Vaping Use: Never used  Substance and Sexual Activity   Alcohol use: No   Drug use: Never   Sexual activity: Yes    Birth control/protection: None    Comment: hysterectomy  Other Topics Concern   Not on file  Social History Narrative   Right handed   One story home   Drinks occasional caffeine   Social Determinants of Health   Financial Resource Strain: Not on file  Food Insecurity: Not on file  Transportation Needs: Not on file  Physical  Activity: Not on file  Stress: Not on file  Social Connections: Not on file  Intimate Partner Violence: Not on file    Review of Systems: Review of Systems  Constitutional:  Positive for malaise/fatigue and weight loss. Negative for chills and fever.  HENT:  Negative for hearing loss and tinnitus.   Eyes:  Negative for blurred vision and double vision.  Respiratory:  Negative for cough and hemoptysis.   Cardiovascular:  Negative for chest pain and palpitations.  Gastrointestinal:  Positive for blood in stool and diarrhea. Negative for abdominal pain, constipation, heartburn, melena, nausea and vomiting.  Genitourinary:  Negative for dysuria and urgency.  Musculoskeletal:  Negative for myalgias and neck pain.  Skin:  Negative for itching and rash.  Neurological:  Negative for seizures and loss of consciousness.  Psychiatric/Behavioral:  Negative for depression and suicidal ideas.      Physical Exam:Physical Exam Constitutional:      Appearance: Normal appearance.  HENT:     Head: Normocephalic and atraumatic.     Nose: Nose normal. No congestion.     Mouth/Throat:     Mouth: Mucous membranes are moist.  Eyes:     Extraocular Movements: Extraocular movements intact.     Comments: Conjunctival pallor  Cardiovascular:     Rate and Rhythm: Normal rate and regular rhythm.  Pulmonary:     Effort: Pulmonary effort is normal. No respiratory distress.  Abdominal:     General: Bowel sounds are normal. There is no distension.     Palpations: Abdomen is soft. There is no mass.     Tenderness: There is no abdominal tenderness. There is no guarding or rebound.     Hernia: No hernia is present.  Musculoskeletal:        General: No swelling. Normal range of motion.     Cervical back: Normal range of motion and neck supple.  Skin:    General: Skin is warm and dry.     Coloration: Skin is not jaundiced.  Neurological:     General: No focal deficit present.     Mental Status: She is alert  and oriented to person,  place, and time.  Psychiatric:        Mood and Affect: Mood normal.        Behavior: Behavior normal.        Thought Content: Thought content normal.        Judgment: Judgment normal.     Vital signs: Vitals:   05/04/22 0259 05/04/22 0630  BP: 131/75 121/73  Pulse: 79 69  Resp: 18 18  Temp: 98.2 F (36.8 C) 98 F (36.7 C)  SpO2: 100% 97%   Last BM Date : 05/04/22    GI:  Lab Results: Recent Labs    05/03/22 2126 05/04/22 0538 05/04/22 1047  WBC 9.7  --   --   HGB 11.0* 8.5* 7.9*  HCT 34.1* 26.6* 24.2*  PLT 355  --   --    BMET Recent Labs    05/03/22 2126 05/04/22 0538  NA 138 139  K 5.0 4.5  CL 105 105  CO2 24 28  GLUCOSE 178* 143*  BUN 33* 36*  CREATININE 1.44* 1.20*  CALCIUM 8.7* 8.2*   LFT Recent Labs    05/03/22 2126  PROT 7.0  ALBUMIN 3.2*  AST 28  ALT 20  ALKPHOS 123  BILITOT 0.4   PT/INR No results for input(s): "LABPROT", "INR" in the last 72 hours.   Studies/Results: No results found.  Impression: Bright red blood per rectum -Positive fecal occult -BUN 36, creatinine 1.20 -Hgb 7.9 (12.5 4 months ago) -Negative PET scan 02/2022 -CT chest abdomen pelvis with contrast 09/2021: Cirrhosis, no evidence suggest metastatic disease, postop changes of partial small bowel and colonic resection are noted with suture line in the central small bowel and right side of the abdomen involving the small bowel and colon where there is an enterotomy near the rectosigmoid junction   Plan: Complicated history with recent negative PET scan and recent CT not showing metastatic disease. Frequent episodes of dark red blood per rectum. Unknown if patient has diverticulosis. Consider CTA vs bleeding scan if continued bleeding. Continue daily CBC and transfuse as needed to maintain HGB > 7  Continue supportive care [facility] will follow.    LOS: 0 days   Geordan Xu Radford Pax  PA-C 05/04/2022, 1:23 PM  Contact #  [phone]

## 2022-05-05 DIAGNOSIS — F419 Anxiety disorder, unspecified: Secondary | ICD-10-CM

## 2022-05-05 DIAGNOSIS — N179 Acute kidney failure, unspecified: Secondary | ICD-10-CM | POA: Diagnosis not present

## 2022-05-05 DIAGNOSIS — K922 Gastrointestinal hemorrhage, unspecified: Secondary | ICD-10-CM | POA: Diagnosis not present

## 2022-05-05 LAB — IRON AND TIBC
Iron: 80 ug/dL (ref 28–170)
Saturation Ratios: 24 % (ref 10.4–31.8)
TIBC: 341 ug/dL (ref 250–450)
UIBC: 261 ug/dL

## 2022-05-05 LAB — BASIC METABOLIC PANEL
Anion gap: 7 (ref 5–15)
BUN: 26 mg/dL — ABNORMAL HIGH (ref 6–20)
CO2: 28 mmol/L (ref 22–32)
Calcium: 8.6 mg/dL — ABNORMAL LOW (ref 8.9–10.3)
Chloride: 105 mmol/L (ref 98–111)
Creatinine, Ser: 0.95 mg/dL (ref 0.44–1.00)
GFR, Estimated: >60 mL/min (ref >60)
Glucose, Bld: 115 mg/dL — ABNORMAL HIGH (ref 70–99)
Potassium: 4.2 mmol/L (ref 3.5–5.1)
Sodium: 140 mmol/L (ref 135–145)

## 2022-05-05 LAB — HEMOGLOBIN: Hemoglobin: 7.9 g/dL — ABNORMAL LOW (ref 12.0–15.0)

## 2022-05-05 LAB — GLUCOSE, CAPILLARY
Glucose-Capillary: 132 mg/dL — ABNORMAL HIGH (ref 70–99)
Glucose-Capillary: 134 mg/dL — ABNORMAL HIGH (ref 70–99)
Glucose-Capillary: 84 mg/dL (ref 70–99)
Glucose-Capillary: 87 mg/dL (ref 70–99)
Glucose-Capillary: 93 mg/dL (ref 70–99)
Glucose-Capillary: 97 mg/dL (ref 70–99)

## 2022-05-05 LAB — HEMATOCRIT: HCT: 24.6 % — ABNORMAL LOW (ref 36.0–46.0)

## 2022-05-05 MED ORDER — MELATONIN 3 MG PO TABS
3.0000 mg | ORAL_TABLET | Freq: Every day | ORAL | Status: DC
  Administered 2022-05-05 – 2022-05-07 (×3): 3 mg via ORAL
  Filled 2022-05-05 (×3): qty 1

## 2022-05-05 NOTE — Progress Notes (Signed)
  Transition of Care [facility]) Screening Note   Patient Details  Name: Wendy Barber Date of Birth: 02-06-1961   Transition of Care [facility]) CM/SW Contact:    Vassie Moselle, LCSW Phone Number: 05/05/2022, 8:41 AM    Transition of Care Department [facility]) has reviewed patient and no TOC needs have been identified at this time. We will continue to monitor patient advancement through interdisciplinary progression rounds. If new patient transition needs arise, please place a TOC consult.

## 2022-05-05 NOTE — Progress Notes (Signed)
[facility] Gastroenterology Progress Note  Wendy Barber 61 y.o. 03/25/1961  CC: Painless hematochezia   Subjective: Patient states that her rectal bleeding is beginning to improve.  Less amount with each trip to the bathroom.  Last episode was 6 AM.  Denies abdominal pain.  Denies nausea/vomiting.  Patient was able to pull up surgical outpatient progress note that just mentioned history of ileocolonic resection.  She was unable to pull up full surgery report  ROS : Review of Systems  Constitutional:  Negative for chills, fever and weight loss.  Gastrointestinal:  Positive for blood in stool. Negative for abdominal pain, constipation, diarrhea, heartburn, melena, nausea and vomiting.      Objective: Vital signs in last 24 hours: Vitals:   05/04/22 1958 05/05/22 0405  BP: (!) 141/85 (!) 141/85  Pulse: 66 63  Resp: 18 18  Temp: 98.8 F (37.1 C) 98 F (36.7 C)  SpO2: 95% 99%    Physical Exam:  General:  Alert, cooperative, no distress, appears stated age  Head:  Normocephalic, without obvious abnormality, atraumatic  Eyes:  Anicteric sclera, EOM's intact, conjunctival pallor  Lungs:   Clear to auscultation bilaterally, respirations unlabored  Heart:  Regular rate and rhythm, S1, S2 normal  Abdomen:   Soft, non-tender, bowel sounds active all four quadrants,  no masses,     Lab Results: Recent Labs    05/04/22 0538 05/05/22 0552  NA 139 140  K 4.5 4.2  CL 105 105  CO2 28 28  GLUCOSE 143* 115*  BUN 36* 26*  CREATININE 1.20* 0.95  CALCIUM 8.2* 8.6*   Recent Labs    05/03/22 2126  AST 28  ALT 20  ALKPHOS 123  BILITOT 0.4  PROT 7.0  ALBUMIN 3.2*   Recent Labs    05/03/22 2126 05/04/22 0538 05/04/22 2308 05/05/22 0552  WBC 9.7  --   --   --   NEUTROABS 5.8  --   --   --   HGB 11.0*   < > 7.8* 7.9*  HCT 34.1*   < > 24.3* 24.6*  MCV 93.2  --   --   --   PLT 355  --   --   --    < > = values in this interval not displayed.   No results for input(s):  "LABPROT", "INR" in the last 72 hours.    Assessment Painless hematochezia -Hgb 7.9 (8.2) -BUN 26 (36), creatinine 0.95  Appendiceal cancer s/p HIPEC  Plan: Rectal bleeding is beginning to improve.  Hemoglobin stable and BUN improving.  Suspect patient may be experiencing a diverticular bleed as a result of taking extra Xarelto dose.  We will continue to monitor. If further bleeding or worsening bleeding will get CTA versus bleeding scan. Continue to hold Xarelto Continue supportive care [facility] will follow  Garnette Scheuermann PA-C 05/05/2022, 8:25 AM  Contact #  [phone]

## 2022-05-05 NOTE — Plan of Care (Signed)

## 2022-05-05 NOTE — Telephone Encounter (Signed)
Seen in hospital and currently admitted.

## 2022-05-05 NOTE — Progress Notes (Signed)
  Progress Note   Patient: Wendy Barber [id number] DOB: [birth year] DOA: 05/03/2022     1 DOS: the patient was seen and examined on 05/05/2022   Brief hospital course: 61 y.o. female with medical history significant for metastatic appendiceal cancer, DVT and PE on Xarelto, type 2 diabetes mellitus, hypertension, and anxiety, now presenting to the emergency department with rectal bleeding.  Patient reports ample amounts of BRBPR with increased fatigue and dizziness.  Assessment and Plan: 1. Acute lower GI bleed - Presents with recurrent episodes of painless hematochezia    - She reported increased fatigue and dizziness - Initial Hgb is 11.0, trended down and now remaining stable at 7.9 - Holding [place] -GI following. Considering with proceeding with workup vs advancing diet tomorrow   2. AKI  - SCr is 1.44 on admission, up from 0.94 in May 2023  - Likely acute prerenal injury in setting of GIB  - Cr normalized with volume  -Repeat bmet in AM   3. Hx of DVT and PE  - Hold Xarelto per above   4. Appendiceal cancer  - Hx of pseudomyxoma peritonei s/p major resections and HIPEC in [place] in 2005, and local recurrence s/p resections and HIPEC in Feb 2022  - Following with Dr. [name] locally      5. Type II DM  - A1c was 6.8% in February 2023  - Cont SSI coverage as needed -Glycemic trends stable       Subjective: Reports feeling generally weak  Physical Exam: Vitals:   05/04/22 1958 05/05/22 0405 05/05/22 0500 05/05/22 1430  BP: (!) 141/85 (!) 141/85  (!) 146/86  Pulse: 66 63  (!) 59  Resp: '18 18  19  '$ Temp: 98.8 F (37.1 C) 98 F (36.7 C)  97.8 F (36.6 C)  TempSrc: Oral Oral  Oral  SpO2: 95% 99%  100%  Weight:   106.2 kg   Height:       General exam: Conversant, in no acute distress Respiratory system: normal chest rise, clear, no audible wheezing Cardiovascular system: regular rhythm, s1-s2 Gastrointestinal system: Nondistended, nontender, pos BS Central  nervous system: No seizures, no tremors Extremities: No cyanosis, no joint deformities Skin: No rashes, no pallor Psychiatry: Affect normal // no auditory hallucinations   Data Reviewed:  Labs reviewed: Cr 0.95, Hgb 7.9  Family Communication: Pt in room, family at bedside  Disposition: Status is: Inpatient Remains inpatient appropriate because: Severity of illness  Planned Discharge Destination: Home    Author: Marylu Lund, MD 05/05/2022 5:42 PM  For on call review [email or web address].

## 2022-05-06 DIAGNOSIS — K922 Gastrointestinal hemorrhage, unspecified: Secondary | ICD-10-CM | POA: Diagnosis not present

## 2022-05-06 DIAGNOSIS — C181 Malignant neoplasm of appendix: Secondary | ICD-10-CM | POA: Diagnosis not present

## 2022-05-06 DIAGNOSIS — N179 Acute kidney failure, unspecified: Secondary | ICD-10-CM | POA: Diagnosis not present

## 2022-05-06 DIAGNOSIS — Z86718 Personal history of other venous thrombosis and embolism: Secondary | ICD-10-CM | POA: Diagnosis not present

## 2022-05-06 LAB — COMPREHENSIVE METABOLIC PANEL
ALT: 18 U/L (ref 0–44)
AST: 26 U/L (ref 15–41)
Albumin: 2.8 g/dL — ABNORMAL LOW (ref 3.5–5.0)
Alkaline Phosphatase: 98 U/L (ref 38–126)
Anion gap: 6 (ref 5–15)
BUN: 16 mg/dL (ref 6–20)
CO2: 26 mmol/L (ref 22–32)
Calcium: 8.7 mg/dL — ABNORMAL LOW (ref 8.9–10.3)
Chloride: 109 mmol/L (ref 98–111)
Creatinine, Ser: 0.92 mg/dL (ref 0.44–1.00)
GFR, Estimated: >60 mL/min (ref >60)
Glucose, Bld: 104 mg/dL — ABNORMAL HIGH (ref 70–99)
Potassium: 3.5 mmol/L (ref 3.5–5.1)
Sodium: 141 mmol/L (ref 135–145)
Total Bilirubin: 0.5 mg/dL (ref 0.3–1.2)
Total Protein: 6 g/dL — ABNORMAL LOW (ref 6.5–8.1)

## 2022-05-06 LAB — CBC
HCT: 24.2 % — ABNORMAL LOW (ref 36.0–46.0)
Hemoglobin: 7.8 g/dL — ABNORMAL LOW (ref 12.0–15.0)
MCH: 30.8 pg (ref 26.0–34.0)
MCHC: 32.2 g/dL (ref 30.0–36.0)
MCV: 95.7 fL (ref 80.0–100.0)
Platelets: 203 10*3/uL (ref 150–400)
RBC: 2.53 MIL/uL — ABNORMAL LOW (ref 3.87–5.11)
RDW: 15.1 % (ref 11.5–15.5)
WBC: 6.8 10*3/uL (ref 4.0–10.5)
nRBC: 0 % (ref 0.0–0.2)

## 2022-05-06 LAB — GLUCOSE, CAPILLARY
Glucose-Capillary: 100 mg/dL — ABNORMAL HIGH (ref 70–99)
Glucose-Capillary: 94 mg/dL (ref 70–99)
Glucose-Capillary: 106 mg/dL — ABNORMAL HIGH (ref 70–99)
Glucose-Capillary: 147 mg/dL — ABNORMAL HIGH (ref 70–99)
Glucose-Capillary: 128 mg/dL — ABNORMAL HIGH (ref 70–99)

## 2022-05-06 MED ORDER — WITCH HAZEL-GLYCERIN EX PADS
MEDICATED_PAD | CUTANEOUS | Status: DC | PRN
Start: 2022-05-06 — End: 2022-05-08
  Administered 2022-05-06: 1 via TOPICAL
  Filled 2022-05-06: qty 100

## 2022-05-06 MED ORDER — ORPHENADRINE CITRATE ER 100 MG PO TB12
100.0000 mg | ORAL_TABLET | Freq: Once | ORAL | Status: AC
  Administered 2022-05-06: 100 mg via ORAL
  Filled 2022-05-06: qty 1

## 2022-05-06 NOTE — Progress Notes (Signed)
  Progress Note   Patient: Wendy Barber [id number] DOB: [birth year] DOA: 05/03/2022     2 DOS: the patient was seen and examined on 05/06/2022   Brief hospital course: 61 y.o. female with medical history significant for metastatic appendiceal cancer, DVT and PE on Xarelto, type 2 diabetes mellitus, hypertension, and anxiety, now presenting to the emergency department with rectal bleeding.  Patient reports ample amounts of BRBPR with increased fatigue and dizziness.  Assessment and Plan: 1. Acute lower GI bleed - Presents with recurrent episodes of painless hematochezia    - She reported increased fatigue and dizziness - Initial Hgb is 11.0, trended down and now remaining stable at 7.8 - Continuing to hold [place]. Discussed with Dr. Watt Climes. Recommendation to resume anticoagulation 7/16 or 7/17 in light of presenting GI bleed -Will f/u with Oncology regarding choice of anticoagulation moving forward -GI had been following. If rebleed, then would pursue workup -repeat cbc in AM   2. AKI  - SCr is 1.44 on admission, up from 0.94 in May 2023  - Likely acute prerenal injury in setting of GIB  - Cr normalized with volume  -Repeat bmet in AM   3. Hx of DVT and PE  - Hold Xarelto per above -Per GI, consider resuming anticoagulation 7/16 or 7/17   4. Appendiceal cancer  - Hx of pseudomyxoma peritonei s/p major resections and HIPEC in [place] in 2005, and local recurrence s/p resections and HIPEC in Feb 2022  - Following with Dr. [name] locally      5. Type II DM  - A1c was 6.8% in February 2023  - Cont SSI coverage as needed -Glycemic trends stable       Subjective: Continues to feel generally weak this AM  Physical Exam: Vitals:   05/05/22 1430 05/05/22 2047 05/06/22 0551 05/06/22 1302  BP: (!) 146/86 (!) 147/84 137/75 (!) 142/79  Pulse: (!) 59 (!) 59 (!) 59 70  Resp: '19 18 17 20  '$ Temp: 97.8 F (36.6 C) 98.1 F (36.7 C) 98 F (36.7 C) 99.2 F (37.3 C)  TempSrc: Oral  Oral Oral Oral  SpO2: 100% 97% 96% 99%  Weight:      Height:       General exam: Awake, laying in bed, in nad Respiratory system: Normal respiratory effort, no wheezing Cardiovascular system: regular rate, s1, s2 Gastrointestinal system: Soft, nondistended, positive BS Central nervous system: CN2-12 grossly intact, strength intact Extremities: Perfused, no clubbing Skin: Normal skin turgor, no notable skin lesions seen Psychiatry: Mood normal // no visual hallucinations   Data Reviewed:  Labs reviewed: Cr 0.92, Hgb 7.8  Family Communication: Pt in room, family not at bedside  Disposition: Status is: Inpatient Remains inpatient appropriate because: Severity of illness  Planned Discharge Destination: Home    Author: Marylu Lund, MD 05/06/2022 4:55 PM  For on call review [email or web address].

## 2022-05-06 NOTE — Progress Notes (Signed)
Delton See 10:27 AM  Subjective: Patient doing well without signs of bleeding and no new complaints Case discussed with our PA and we reviewed her recurrent DVT history and PE and 2005  Objective: Vital signs stable afebrile no acute distress abdomen is soft nontender hemoglobin stable  Assessment: Resolved GI bleed and patient on Xarelto  Plan: Recommend calling her oncologist Dr. [name] and get his feeling on whether she needs Xarelto or can be maintained on a different blood thinner but if she has recurrent bleeding in the future would proceed with work-up at that time and we discussed 1 iron pill a day at home and I am happy to see back as needed and we will slowly advance her diet and hopefully she can go home soon once decision about blood thinner is made if tolerating diet and please call us this weekend if we can be of any further assistance and she agrees with the plan  [facility]  office [phone] After 5PM or if no answer call [phone]

## 2022-05-07 DIAGNOSIS — C772 Secondary and unspecified malignant neoplasm of intra-abdominal lymph nodes: Secondary | ICD-10-CM | POA: Diagnosis not present

## 2022-05-07 DIAGNOSIS — C181 Malignant neoplasm of appendix: Secondary | ICD-10-CM | POA: Diagnosis not present

## 2022-05-07 DIAGNOSIS — F419 Anxiety disorder, unspecified: Secondary | ICD-10-CM | POA: Diagnosis not present

## 2022-05-07 DIAGNOSIS — K922 Gastrointestinal hemorrhage, unspecified: Secondary | ICD-10-CM | POA: Diagnosis not present

## 2022-05-07 LAB — COMPREHENSIVE METABOLIC PANEL
ALT: 19 U/L (ref 0–44)
AST: 28 U/L (ref 15–41)
Albumin: 2.8 g/dL — ABNORMAL LOW (ref 3.5–5.0)
Alkaline Phosphatase: 92 U/L (ref 38–126)
Anion gap: 7 (ref 5–15)
BUN: 17 mg/dL (ref 6–20)
CO2: 26 mmol/L (ref 22–32)
Calcium: 8.7 mg/dL — ABNORMAL LOW (ref 8.9–10.3)
Chloride: 110 mmol/L (ref 98–111)
Creatinine, Ser: 1.17 mg/dL — ABNORMAL HIGH (ref 0.44–1.00)
GFR, Estimated: 54 mL/min — ABNORMAL LOW (ref >60)
Glucose, Bld: 156 mg/dL — ABNORMAL HIGH (ref 70–99)
Potassium: 3.9 mmol/L (ref 3.5–5.1)
Sodium: 143 mmol/L (ref 135–145)
Total Bilirubin: 0.4 mg/dL (ref 0.3–1.2)
Total Protein: 6.2 g/dL — ABNORMAL LOW (ref 6.5–8.1)

## 2022-05-07 LAB — GLUCOSE, CAPILLARY
Glucose-Capillary: 138 mg/dL — ABNORMAL HIGH (ref 70–99)
Glucose-Capillary: 169 mg/dL — ABNORMAL HIGH (ref 70–99)
Glucose-Capillary: 153 mg/dL — ABNORMAL HIGH (ref 70–99)
Glucose-Capillary: 128 mg/dL — ABNORMAL HIGH (ref 70–99)

## 2022-05-07 LAB — CBC
HCT: 25.6 % — ABNORMAL LOW (ref 36.0–46.0)
Hemoglobin: 8.1 g/dL — ABNORMAL LOW (ref 12.0–15.0)
MCH: 30.7 pg (ref 26.0–34.0)
MCHC: 31.6 g/dL (ref 30.0–36.0)
MCV: 97 fL (ref 80.0–100.0)
Platelets: 283 10*3/uL (ref 150–400)
RBC: 2.64 MIL/uL — ABNORMAL LOW (ref 3.87–5.11)
RDW: 15.3 % (ref 11.5–15.5)
WBC: 10 10*3/uL (ref 4.0–10.5)
nRBC: 0 % (ref 0.0–0.2)

## 2022-05-07 MED ORDER — TRAMADOL HCL 50 MG PO TABS
50.0000 mg | ORAL_TABLET | Freq: Four times a day (QID) | ORAL | Status: DC | PRN
  Administered 2022-05-07 (×2): 50 mg via ORAL
  Filled 2022-05-07 (×2): qty 1

## 2022-05-07 MED ORDER — ACETAMINOPHEN 325 MG PO TABS
650.0000 mg | ORAL_TABLET | Freq: Four times a day (QID) | ORAL | Status: DC | PRN
Start: 2022-05-07 — End: 2022-05-08
  Administered 2022-05-07: 650 mg via ORAL
  Filled 2022-05-07: qty 2

## 2022-05-07 MED ORDER — ALUM & MAG HYDROXIDE-SIMETH 200-200-20 MG/5ML PO SUSP
15.0000 mL | Freq: Four times a day (QID) | ORAL | Status: DC | PRN
  Administered 2022-05-07: 15 mL via ORAL
  Filled 2022-05-07: qty 30

## 2022-05-07 MED ORDER — HYDROCORTISONE (PERIANAL) 2.5 % EX CREA
TOPICAL_CREAM | Freq: Two times a day (BID) | CUTANEOUS | Status: DC
  Administered 2022-05-07: 1 via RECTAL
  Filled 2022-05-07: qty 28.35

## 2022-05-07 MED ORDER — ORPHENADRINE CITRATE ER 100 MG PO TB12
100.0000 mg | ORAL_TABLET | Freq: Every evening | ORAL | Status: DC | PRN
  Filled 2022-05-07: qty 1

## 2022-05-07 MED ORDER — CITALOPRAM HYDROBROMIDE 20 MG PO TABS
40.0000 mg | ORAL_TABLET | Freq: Every day | ORAL | Status: DC
  Administered 2022-05-07 – 2022-05-08 (×2): 40 mg via ORAL
  Filled 2022-05-07 (×2): qty 2

## 2022-05-07 MED ORDER — LOPERAMIDE HCL 2 MG PO CAPS
4.0000 mg | ORAL_CAPSULE | ORAL | Status: DC | PRN
  Administered 2022-05-07: 4 mg via ORAL
  Filled 2022-05-07: qty 2

## 2022-05-07 MED ORDER — DICYCLOMINE HCL 20 MG PO TABS
20.0000 mg | ORAL_TABLET | Freq: Three times a day (TID) | ORAL | Status: DC
  Administered 2022-05-07 – 2022-05-08 (×2): 20 mg via ORAL
  Filled 2022-05-07 (×2): qty 1

## 2022-05-07 MED ORDER — ONDANSETRON HCL 4 MG PO TABS
8.0000 mg | ORAL_TABLET | Freq: Three times a day (TID) | ORAL | Status: DC | PRN
Start: 2022-05-07 — End: 2022-05-08

## 2022-05-07 MED ORDER — FOLIC ACID 1 MG PO TABS
2.0000 mg | ORAL_TABLET | Freq: Every day | ORAL | Status: DC
  Administered 2022-05-07 – 2022-05-08 (×2): 2 mg via ORAL
  Filled 2022-05-07 (×2): qty 2

## 2022-05-07 NOTE — Progress Notes (Signed)
  Progress Note   Patient: Wendy Barber [id number] DOB: [birth year] DOA: 05/03/2022     3 DOS: the patient was seen and examined on 05/07/2022   Brief hospital course: 61 y.o. female with medical history significant for metastatic appendiceal cancer, DVT and PE on Xarelto, type 2 diabetes mellitus, hypertension, and anxiety, now presenting to the emergency department with rectal bleeding.  Patient reports ample amounts of BRBPR with increased fatigue and dizziness.  Assessment and Plan: 1. Acute lower GI bleed with acute blood loss anemia - Presents with recurrent episodes of painless hematochezia    - She reported increased fatigue and dizziness - Initial Hgb is 11.0, trended down and now remaining stable at 7.8 - Had held [place] this visit. -Appreciate input by Dr. [name].  -GI had been following. If rebleed, then would pursue workup -repeat cbc in AM   2. AKI  - SCr is 1.44 on admission, up from 0.94 in May 2023  - Likely acute prerenal injury in setting of GIB  - Cr normalized with volume  -Repeat bmet in AM   3. Hx of DVT and PE  - Hold Xarelto per above -Per GI, consider resuming anticoagulation 7/16 or 7/17   4. Appendiceal cancer  - Hx of pseudomyxoma peritonei s/p major resections and HIPEC in [place] in 2005, and local recurrence s/p resections and HIPEC in Feb 2022  - Following with Dr. [name] locally      5. Type II DM  - A1c was 6.8% in February 2023  - Cont SSI coverage as needed -Glycemic trends stable  6. Chronic abd pain and nausea with fibromyalgia - Home celexa, bentyl, imodium, zofran were initially held secondary to above GI bleed - Bleeding stopped -This AM, pt reports increased diarrhea, abd pains requiring narcotic and nausea -Have resumed home meds for now.      Subjective: Complains of nausea and abd discomfort  Physical Exam: Vitals:   05/06/22 1302 05/06/22 1958 05/07/22 0317 05/07/22 0344  BP: (!) 142/79 (!) 155/90  (!) 148/83   Pulse: 70 70  68  Resp: '20 18  16  '$ Temp: 99.2 F (37.3 C) 98.5 F (36.9 C)  98.3 F (36.8 C)  TempSrc: Oral Oral  Oral  SpO2: 99% 99%  95%  Weight:   103.3 kg   Height:       General exam: Conversant, in no acute distress Respiratory system: normal chest rise, clear, no audible wheezing Cardiovascular system: regular rhythm, s1-s2 Gastrointestinal system: Nondistended, nontender, pos BS Central nervous system: No seizures, no tremors Extremities: No cyanosis, no joint deformities Skin: No rashes, no pallor Psychiatry: Affect normal // no auditory hallucinations   Data Reviewed:  Labs reviewed: Cr 1.17, Hgb 8.1  Family Communication: Pt in room, family at bedside  Disposition: Status is: Inpatient Remains inpatient appropriate because: Severity of illness  Planned Discharge Destination: Home    Author: Marylu Lund, MD 05/07/2022 4:54 PM  For on call review [email or web address].

## 2022-05-07 NOTE — Plan of Care (Signed)
Discussed with patient plan of care for the evening, pian management and something for her hemorrhoids with some teach back displayed  Problem: Education: Goal: Knowledge of General Education information will improve Description: Including pain rating scale, medication(s)/side effects and non-pharmacologic comfort measures Outcome: Progressing

## 2022-05-07 NOTE — Consult Note (Signed)
Referral MD  Reason for Referral: Hematochezia-on anticoagulation for SMV thrombus  Chief Complaint  Patient presents with   Rectal Bleeding  : Wendy Barber started having rectal bleeding.  HPI: Wendy Barber is well-known to me.  Wendy Barber is a very nice 61 year old white female.  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  Wendy Barber has undergone extensive surgical resection of [facility].  Wendy Barber is actually followed up there.  Wendy Barber has been on anticoagulation because of a SMV thrombus.  This was about a year and a half ago.  Wendy Barber does have an IVC filter in place for past history of left femoral vein DVT.  Wendy Barber has never had any problems with bleeding.  Wendy Barber does have some hemorrhoid problems.  Wendy Barber said that earlier this week, Wendy Barber began to feel poorly.  Wendy Barber just felt very tired.  Wendy Barber did not have a lot of energy.  Wendy Barber then had bright red blood per rectum.  Wendy Barber called the office.  We told her that Wendy Barber had to go to the emergency room.  Wendy Barber subsequently went to the emergency room.  Wendy Barber was admitted on 11 July.  At that time, white count 9.7.  Hemoglobin 11.  Platelet count 355,000.  Her sodium is 138.  Potassium 5.0 BUN 33 creatinine 1.44.  Calcium is 8.7.  LFTs are normal.  Of note, Wendy Barber had a recent PET scan for carcinoid tumor.  This was negative.  Wendy Barber has not required any transfusions.  Wendy Barber may benefit from some folic acid.  Wendy Barber has had no fever.  There is been no problems with her urine.  Wendy Barber think Wendy Barber has had urinary tract infection before.  Wendy Barber has had C. difficile which has been a real problem for her.  Patient does have some chronic pain issues.  Wendy Barber has been evaluated by Gastroenterology.  Thankfully, did not feel that Wendy Barber needed to have any endoscopy.  Wendy Barber think if Wendy Barber felt this was likely a diverticular bleed.  Currently, Wendy Barber would say performance status is probably ECOG 1.   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   Pernicious anemia 05/14/2021   Presence of IVC filter 03/19/2020   Pulmonary embolism, bilateral ([place]) 03/19/2020  :   Past Surgical History:  Procedure Laterality Date   ABDOMINAL HYSTERECTOMY     ABDOMINAL SURGERY     ACHILLES TENDON REPAIR     APPENDECTOMY     ARTHROPLASTY     CARPAL TUNNEL RELEASE     CHOLECYSTECTOMY     IR CATHETER TUBE CHANGE  02/02/2021   IR CATHETER TUBE CHANGE  02/25/2021   IR RADIOLOGIST EVAL & MGMT  02/24/2021   IR RADIOLOGIST EVAL & MGMT  03/10/2021   IR US GUIDE BX ASP/DRAIN  11/25/2019   JOINT REPLACEMENT     KNEE ARTHROSCOPY     ORIF ANKLE FRACTURE Right 08/27/2019   Procedure: OPEN REDUCTION INTERNAL FIXATION RIGHT ANKLE FRACTURE;  Surgeon: Renette Butters, MD;  Location: [facility];  Service:  Orthopedics;  Laterality: Right;   perineorrophy     TONGUE BIOPSY    :   Current Facility-Administered Medications:    buPROPion (WELLBUTRIN XL) 24 hr tablet 150 mg, 150 mg, Oral, Daily, Donne Hazel, MD, 150 mg at 05/06/22 0940   busPIRone (BUSPAR) tablet 15 mg, 15 mg, Oral, TID, Donne Hazel, MD, 15 mg at 05/06/22 2116   insulin aspart (novoLOG) injection 0-6 Units, 0-6 Units, Subcutaneous, Q4H, Opyd, Ilene Qua, MD, 1 Units at 05/04/22 0458   melatonin tablet 3 mg, 3 mg, Oral, QHS, Kyere, Belinda K, NP, 3 mg at 05/06/22 2116   pantoprazole (PROTONIX) EC tablet 40 mg, 40 mg, Oral, q morning, Donne Hazel, MD, 40 mg at 05/06/22 0940   propranolol (INDERAL) tablet 20 mg, 20 mg, Oral, BID, Donne Hazel, MD, 20 mg at 05/06/22 2117   sodium chloride flush (NS) 0.9 %  injection 3 mL, 3 mL, Intravenous, Q12H, Opyd, Ilene Qua, MD, 3 mL at 05/06/22 2117   witch hazel-glycerin (TUCKS) pad, , Topical, PRN, Donne Hazel, MD, 1 Application at 56/21/30 2330:   buPROPion  150 mg Oral Daily   busPIRone  15 mg Oral TID   insulin aspart  0-6 Units Subcutaneous Q4H   melatonin  3 mg Oral QHS   pantoprazole  40 mg Oral q morning   propranolol  20 mg Oral BID   sodium chloride flush  3 mL Intravenous Q12H  :   Allergies  Allergen Reactions   Penicillins Shortness Of Breath    Other reaction(s): Irregular Heart Rate, Other (See Comments) Rapid heartrate    Alprazolam Hives and Other (See Comments)    Hard to arouse, unresponsiveness   Ativan [Lorazepam] Other (See Comments)    Note: tolerates midazolam fine Face & Throat Swelling.   Corticosteroids Other (See Comments)    Other reaction(s): Other (see comments) Psychotic behaviour     Erythromycin     Other reaction(s): Other (See Comments) Severe stomach pain    Prednisone Other (See Comments)    Anxiety & Nervous Breakdown.   Savella  [Milnacipran]    Povidone Iodine Rash    Had rash under bandage after surgery , has had betadine since without reaction    Prednisolone Anxiety  :   Family History  Problem Relation Age of Onset   Depression Mother    Drug abuse Sister    Suicidality Sister    Alcohol abuse Brother    Drug abuse Brother   :   Social History   Socioeconomic History   Marital status: Married    Spouse name: Not on file   Number of children: 3   Years of education: Not on file   Highest education level: Some college, no degree  Occupational History   Occupation: disability  Tobacco Use   Smoking status: Never   Smokeless tobacco: Never  Vaping Use   Vaping Use: Never used  Substance and Sexual Activity   Alcohol use: No   Drug use: Never   Sexual activity: Yes    Birth control/protection: None    Comment: hysterectomy  Other Topics Concern   Not on file   Social History Narrative   Right handed   One story home   Drinks occasional caffeine   Social Determinants of Health   Financial Resource Strain: Not on file  Food Insecurity: Not on file  Transportation Needs: Not on file  Physical Activity: Not on file  Stress: Not  on file  Social Connections: Not on file  Intimate Partner Violence: Not on file  :  Pertinent items are noted in HPI.  Exam: Patient Vitals for the past 24 hrs:  BP Temp Temp src Pulse Resp SpO2 Weight  05/07/22 0344 (!) 148/83 98.3 F (36.8 C) Oral 68 16 95 % --  05/07/22 0317 -- -- -- -- -- -- 227 lb 12.8 oz (103.3 kg)  05/06/22 1958 (!) 155/90 98.5 F (36.9 C) Oral 70 18 99 % --  05/06/22 1302 (!) 142/79 99.2 F (37.3 C) Oral 70 20 99 % --   Physical Exam Vitals reviewed.  HENT:     Head: Normocephalic and atraumatic.  Eyes:     Pupils: Pupils are equal, round, and reactive to light.  Cardiovascular:     Rate and Rhythm: Normal rate and regular rhythm.     Heart sounds: Normal heart sounds.  Pulmonary:     Effort: Pulmonary effort is normal.     Breath sounds: Normal breath sounds.  Abdominal:     General: Bowel sounds are normal.     Palpations: Abdomen is soft.  Musculoskeletal:        General: No tenderness or deformity. Normal range of motion.     Cervical back: Normal range of motion.  Lymphadenopathy:     Cervical: No cervical adenopathy.  Skin:    General: Skin is warm and dry.     Findings: No erythema or rash.  Neurological:     Mental Status: Wendy Barber is alert and oriented to person, place, and time.  Psychiatric:        Behavior: Behavior normal.        Thought Content: Thought content normal.        Judgment: Judgment normal.     Recent Labs    05/06/22 0546 05/07/22 0534  WBC 6.8 10.0  HGB 7.8* 8.1*  HCT 24.2* 25.6*  PLT 203 283    Recent Labs    05/05/22 0552 05/06/22 0546  NA 140 141  K 4.2 3.5  CL 105 109  CO2 28 26  GLUCOSE 115* 104*  BUN 26* 16   CREATININE 0.95 0.92  CALCIUM 8.6* 8.7*    Blood smear review: None  Pathology: None    Assessment and Plan: Ms. Nickolson is a very nice 61 year old white female.  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  This has been resected.  Wendy Barber underwent extensive surgery for this of it at [facility].  Wendy Barber had a lot of complications afterwards.  Wendy Barber was in the hospital down here for several weeks because of diarrhea, C. difficile, pain.  Wendy Barber now have this episode of GI bleeding.  This was lower GI bleeding.  Again Wendy Barber suspect this probably was diverticular.  I do think we can have her off anticoagulation.  Her last scans, there is no evidence of SMV thrombus.  Wendy Barber know Wendy Barber has a past history of a left femoral vein thrombus but has an IVC filter in place.  Again I do not see a problem with her being off anticoagulation.  I do think folic acid would be helpful for her.  Wendy Barber did order 2 mg today.  Hopefully, Wendy Barber will be able to go home today.  Wendy Barber looks great.  Wendy Barber sounds good.  Wendy Barber is having some hemorrhoid issues.  Wendy Barber told her that Dr. Watt Climes could help with this.  Wendy Barber know Wendy Barber is gotten incredible care from all the staff up on  [address]  Wendy Haw, MD  Philippians 4:6

## 2022-05-08 DIAGNOSIS — Z86711 Personal history of pulmonary embolism: Secondary | ICD-10-CM | POA: Diagnosis not present

## 2022-05-08 DIAGNOSIS — K922 Gastrointestinal hemorrhage, unspecified: Secondary | ICD-10-CM | POA: Diagnosis not present

## 2022-05-08 DIAGNOSIS — C181 Malignant neoplasm of appendix: Secondary | ICD-10-CM | POA: Diagnosis not present

## 2022-05-08 DIAGNOSIS — N179 Acute kidney failure, unspecified: Secondary | ICD-10-CM | POA: Diagnosis not present

## 2022-05-08 LAB — COMPREHENSIVE METABOLIC PANEL
ALT: 24 U/L (ref 0–44)
AST: 39 U/L (ref 15–41)
Albumin: 2.9 g/dL — ABNORMAL LOW (ref 3.5–5.0)
Alkaline Phosphatase: 91 U/L (ref 38–126)
Anion gap: 5 (ref 5–15)
BUN: 18 mg/dL (ref 6–20)
CO2: 26 mmol/L (ref 22–32)
Calcium: 8.5 mg/dL — ABNORMAL LOW (ref 8.9–10.3)
Chloride: 110 mmol/L (ref 98–111)
Creatinine, Ser: 0.88 mg/dL (ref 0.44–1.00)
GFR, Estimated: >60 mL/min (ref >60)
Glucose, Bld: 131 mg/dL — ABNORMAL HIGH (ref 70–99)
Potassium: 3.4 mmol/L — ABNORMAL LOW (ref 3.5–5.1)
Sodium: 141 mmol/L (ref 135–145)
Total Bilirubin: 0.3 mg/dL (ref 0.3–1.2)
Total Protein: 6.2 g/dL — ABNORMAL LOW (ref 6.5–8.1)

## 2022-05-08 LAB — GLUCOSE, CAPILLARY
Glucose-Capillary: 131 mg/dL — ABNORMAL HIGH (ref 70–99)
Glucose-Capillary: 132 mg/dL — ABNORMAL HIGH (ref 70–99)
Glucose-Capillary: 145 mg/dL — ABNORMAL HIGH (ref 70–99)

## 2022-05-08 LAB — CBC
HCT: 25.4 % — ABNORMAL LOW (ref 36.0–46.0)
Hemoglobin: 8.2 g/dL — ABNORMAL LOW (ref 12.0–15.0)
MCH: 30.9 pg (ref 26.0–34.0)
MCHC: 32.3 g/dL (ref 30.0–36.0)
MCV: 95.8 fL (ref 80.0–100.0)
Platelets: 293 10*3/uL (ref 150–400)
RBC: 2.65 MIL/uL — ABNORMAL LOW (ref 3.87–5.11)
RDW: 15.5 % (ref 11.5–15.5)
WBC: 9.5 10*3/uL (ref 4.0–10.5)
nRBC: 0 % (ref 0.0–0.2)

## 2022-05-08 MED ORDER — LOPERAMIDE HCL 2 MG PO CAPS: 4.0000 mg | ORAL_CAPSULE | Freq: Four times a day (QID) | ORAL | Status: AC | PRN

## 2022-05-08 MED ORDER — FOLIC ACID 1 MG PO TABS: 2.0000 mg | ORAL_TABLET | Freq: Every day | ORAL | 0 refills | Status: DC

## 2022-05-08 MED ORDER — TRAMADOL HCL 50 MG PO TABS: 50.0000 mg | ORAL_TABLET | Freq: Four times a day (QID) | ORAL | 0 refills | Status: DC | PRN

## 2022-05-08 MED ORDER — HYDROCORTISONE (PERIANAL) 2.5 % EX CREA: TOPICAL_CREAM | Freq: Two times a day (BID) | CUTANEOUS | 1 refills | Status: DC

## 2022-05-08 MED ORDER — DICYCLOMINE HCL 20 MG PO TABS: 20.0000 mg | ORAL_TABLET | Freq: Four times a day (QID) | ORAL | Status: DC

## 2022-05-08 MED ORDER — ALUM & MAG HYDROXIDE-SIMETH 200-200-20 MG/5ML PO SUSP: 15.0000 mL | Freq: Four times a day (QID) | ORAL | 0 refills | Status: DC | PRN

## 2022-05-08 MED ORDER — POTASSIUM CHLORIDE CRYS ER 20 MEQ PO TBCR
40.0000 meq | EXTENDED_RELEASE_TABLET | Freq: Once | ORAL | Status: AC
  Administered 2022-05-08: 40 meq via ORAL
  Filled 2022-05-08: qty 2

## 2022-05-08 NOTE — Plan of Care (Signed)

## 2022-05-08 NOTE — Progress Notes (Signed)
Discharge teaching complete. Meds, diet, activity, follow up appointments reviewed and all questions answered. Copy of instructions given to patient and prescriptions sent to pharmacy.  

## 2022-05-08 NOTE — Discharge Summary (Signed)
Physician Discharge Summary  Wendy Barber BPZ:[id number] DOB: [birth year] DOA: 05/03/2022  PCP: [name], MD  Admit date: 05/03/2022 Discharge date: 05/08/2022  Admitted From: Home Disposition: Home  Recommendations for Outpatient Follow-up:  Follow up with PCP in 1 week with repeat CBC/BMP Outpatient follow-up with oncology and GI Follow up in ED if symptoms worsen or new appear   Home Health: No Equipment/Devices: None  Discharge Condition: Stable CODE STATUS: Full Diet recommendation: Heart healthy  Brief/Interim Summary: 61 y.o. female with medical history significant for metastatic appendiceal cancer, DVT and PE on Xarelto, type 2 diabetes mellitus, hypertension, and anxiety presented with rectal bleeding.  Initial hemoglobin was 11 and trended down to 7.8.  Patient was managed conservatively as per GI recommendations.  Oncology was also consulted who recommended to discontinue Xarelto for now.  Hemoglobin is 8.2 today.  She feels better and wants to go home today.  She is tolerating diet.  She will be discharged home today with outpatient follow-up with PCP/oncology/GI.  Discharge Diagnoses:   Acute lower GI bleeding Acute blood loss anemia -Presented with painless hematochezia. - Initial hemoglobin was 11 and trended down to 7.8.  Patient was managed conservatively as per GI recommendations.  Oncology was also consulted who recommended to discontinue Xarelto for now.  Hemoglobin is 8.2 today.  She feels better and wants to go home today.  She is tolerating diet.  She will be discharged home today with outpatient follow-up with PCP/oncology/GI. -Outpatient follow-up of CBC  AKI -Creatinine 1.44 on admission.  Resolved.  Outpatient follow-up.  Hypertension -Blood pressure on the higher side.Marland Kitchen  Resume valsartan/hydrochlorothiazide.  Outpatient follow-up  History of DVT and PE with history of IVC filter in place -Dr. [name] recommends that patient can come off  Xarelto.  Will not resume Xarelto on discharge.  Outpatient follow-up with Dr. [name]  History of appendiceal cancer -With history of extensive surgery in [place].  Outpatient follow-up with oncology.  Diabetes type II -A1c 6.8 in February 2023.  Carb modified diet.  Outpatient follow-up  Chronic abdominal pain and nausea with fibromyalgia -Continue home regimen.  Outpatient follow-up  Hemorrhoids -Continue hydrocortisone rectal cream for now.  Might need outpatient follow-up with GI/general surgery if needed.  Obesity -Outpatient follow-up  Hypokalemia -Replace prior to discharge  Discharge Instructions  Discharge Instructions     Diet - low sodium heart healthy   Complete by: As directed    Increase activity slowly   Complete by: As directed       Allergies as of 05/08/2022       Reactions   Penicillins Shortness Of Breath   Other reaction(s): Irregular Heart Rate, Other (See Comments) Rapid heartrate   Alprazolam Hives, Other (See Comments)   Hard to arouse, unresponsiveness   Ativan [lorazepam] Other (See Comments)   Note: tolerates midazolam fine Face & Throat Swelling.   Corticosteroids Other (See Comments)   Other reaction(s): Other (see comments) Psychotic behaviour   Erythromycin    Other reaction(s): Other (See Comments) Severe stomach pain   Prednisone Other (See Comments)   Anxiety & Nervous Breakdown.   Savella  [milnacipran]    Povidone Iodine Rash   Had rash under bandage after surgery , has had betadine since without reaction    Prednisolone Anxiety        Medication List     STOP taking these medications    diazepam 5 MG tablet Commonly known as: VALIUM   doxycycline 100 MG tablet Commonly known  as: VIBRA-TABS   Melatonin 1 MG Chew   Xarelto 20 MG Tabs tablet Generic drug: rivaroxaban       TAKE these medications    alum & mag hydroxide-simeth 200-200-20 MG/5ML suspension Commonly known as: MAALOX/MYLANTA Take 15 mLs  by mouth every 6 (six) hours as needed for indigestion or heartburn.   buPROPion 150 MG 24 hr tablet Commonly known as: Wellbutrin XL Take 1 tablet (150 mg total) by mouth daily.   busPIRone 15 MG tablet Commonly known as: BUSPAR Take 1 tablet (15 mg total) by mouth 3 (three) times daily.   citalopram 40 MG tablet Commonly known as: CeleXA Take 1 tablet (40 mg total) by mouth daily.   dicyclomine 20 MG tablet Commonly known as: BENTYL Take 1 tablet (20 mg total) by mouth in the morning, at noon, in the evening, and at bedtime.   diphenoxylate-atropine 2.5-0.025 MG tablet Commonly known as: LOMOTIL TAKE 2 TABLETS IN THE MORNING, AT NOON, IN THE EVENING AND AT BEDTIME What changed: See the new instructions.   eszopiclone 2 MG Tabs tablet Commonly known as: Lunesta Take 1 tablet (2 mg total) by mouth at bedtime as needed for sleep. Take immediately before bedtime   famotidine 20 MG tablet Commonly known as: PEPCID TAKE 2 TABLETS TWICE A DAY   folic acid 1 MG tablet Commonly known as: FOLVITE Take 2 tablets (2 mg total) by mouth daily. Start taking on: May 09, 2022   hydrocortisone 2.5 % rectal cream Commonly known as: ANUSOL-HC Place rectally 2 (two) times daily.   loperamide 2 MG capsule Commonly known as: IMODIUM Take 2 capsules (4 mg total) by mouth 4 (four) times daily as needed for diarrhea or loose stools. What changed:  when to take this reasons to take this   ondansetron 8 MG tablet Commonly known as: ZOFRAN Take 8 mg by mouth every 8 (eight) hours as needed for nausea.   orphenadrine 100 MG tablet Commonly known as: NORFLEX Take 1 tablet (100 mg total) by mouth at bedtime as needed for muscle spasms.   pantoprazole 40 MG tablet Commonly known as: PROTONIX Take 40 mg by mouth every morning.   promethazine 12.5 MG tablet Commonly known as: PHENERGAN Take 1 tablet (12.5 mg total) by mouth 2 (two) times daily as needed. What changed: reasons to take  this   propranolol 20 MG tablet Commonly known as: INDERAL Take 1 tablet (20 mg total) by mouth 2 (two) times daily.   traMADol 50 MG tablet Commonly known as: ULTRAM Take 1 tablet (50 mg total) by mouth every 6 (six) hours as needed for moderate pain.   valsartan-hydrochlorothiazide 160-12.5 MG tablet Commonly known as: DIOVAN-HCT Take 1 tablet by mouth daily.        Follow-up Information     [name], MD. Schedule an appointment as soon as possible for a visit in 1 week(s).   Specialties: Family Medicine, Sports Medicine Why: with cbc/bmp Contact information: [address] [place] [address] [phone]         Clarene Essex, MD. Schedule an appointment as soon as possible for a visit in 1 week(s).   Specialty: Gastroenterology Contact information: [address] [place] [address] [phone]                Allergies  Allergen Reactions   Penicillins Shortness Of Breath    Other reaction(s): Irregular Heart Rate, Other (See Comments) Rapid heartrate  Alprazolam Hives and Other (See Comments)    Hard to arouse, unresponsiveness   Ativan [Lorazepam] Other (See Comments)    Note: tolerates midazolam fine Face & Throat Swelling.   Corticosteroids Other (See Comments)    Other reaction(s): Other (see comments) Psychotic behaviour     Erythromycin     Other reaction(s): Other (See Comments) Severe stomach pain    Prednisone Other (See Comments)    Anxiety & Nervous Breakdown.   Savella  [Milnacipran]    Povidone Iodine Rash    Had rash under bandage after surgery , has had betadine since without reaction    Prednisolone Anxiety    Consultations: GI/oncology   Procedures/Studies: No results found.    Subjective: Patient seen and examined at bedside.  Feels slightly better and wants to go home today.  Had issues with hemorrhoidal pain yesterday, slightly improving.  No fever, vomiting or rectal bleeding  reported.  Discharge Exam: Vitals:   05/07/22 1957 05/08/22 0439  BP: (!) 161/94 (!) 157/92  Pulse: 72 68  Resp: 18 18  Temp: 98.3 F (36.8 C) 98 F (36.7 C)  SpO2: 97% 95%    General: Pt is alert, awake, not in acute distress.  Currently on room air Cardiovascular: rate controlled, S1/S2 + Respiratory: bilateral decreased breath sounds at bases Abdominal: Soft, obese, NT, ND, bowel sounds + Extremities: no edema, no cyanosis    The results of significant diagnostics from this hospitalization (including imaging, microbiology, ancillary and laboratory) are listed below for reference.     Microbiology: No results found for this or any previous visit (from the past 240 hour(s)).   Labs: BNP (last 3 results) No results for input(s): "BNP" in the last 8760 hours. Basic Metabolic Panel: Recent Labs  Lab 05/04/22 0538 05/05/22 0552 05/06/22 0546 05/07/22 0534 05/08/22 0457  NA 139 140 141 143 141  K 4.5 4.2 3.5 3.9 3.4*  CL 105 105 109 110 110  CO2 '28 28 26 26 26  '$ GLUCOSE 143* 115* 104* 156* 131*  BUN 36* 26* '16 17 18  '$ CREATININE 1.20* 0.95 0.92 1.17* 0.88  CALCIUM 8.2* 8.6* 8.7* 8.7* 8.5*   Liver Function Tests: Recent Labs  Lab 05/03/22 2126 05/06/22 0546 05/07/22 0534 05/08/22 0457  AST '28 26 28 '$ 39  ALT '20 18 19 24  '$ ALKPHOS 123 98 92 91  BILITOT 0.4 0.5 0.4 0.3  PROT 7.0 6.0* 6.2* 6.2*  ALBUMIN 3.2* 2.8* 2.8* 2.9*   No results for input(s): "LIPASE", "AMYLASE" in the last 168 hours. No results for input(s): "AMMONIA" in the last 168 hours. CBC: Recent Labs  Lab 05/03/22 2126 05/04/22 0538 05/04/22 2308 05/05/22 0552 05/06/22 0546 05/07/22 0534 05/08/22 0457  WBC 9.7  --   --   --  6.8 10.0 9.5  NEUTROABS 5.8  --   --   --   --   --   --   HGB 11.0*   < > 7.8* 7.9* 7.8* 8.1* 8.2*  HCT 34.1*   < > 24.3* 24.6* 24.2* 25.6* 25.4*  MCV 93.2  --   --   --  95.7 97.0 95.8  PLT 355  --   --   --  203 283 293   < > = values in this interval not  displayed.   Cardiac Enzymes: No results for input(s): "CKTOTAL", "CKMB", "CKMBINDEX", "TROPONINI" in the last 168 hours. BNP: Invalid input(s): "POCBNP" CBG: Recent Labs  Lab 05/07/22 1709 05/07/22 2031 05/08/22 0005 05/08/22  9702 05/08/22 0738  GLUCAP 153* 128* 145* 131* 132*   D-Dimer No results for input(s): "DDIMER" in the last 72 hours. Hgb A1c No results for input(s): "HGBA1C" in the last 72 hours. Lipid Profile No results for input(s): "CHOL", "HDL", "LDLCALC", "TRIG", "CHOLHDL", "LDLDIRECT" in the last 72 hours. Thyroid function studies No results for input(s): "TSH", "T4TOTAL", "T3FREE", "THYROIDAB" in the last 72 hours.  Invalid input(s): "FREET3" Anemia work up Recent Labs    05/05/22 1327  TIBC 341  IRON 80   Urinalysis    Component Value Date/Time   COLORURINE RED (A) 05/03/2022 2134   APPEARANCEUR CLOUDY (A) 05/03/2022 2134   LABSPEC 1.025 05/03/2022 2134   PHURINE 5.0 05/03/2022 2134   GLUCOSEU NEGATIVE 05/03/2022 2134   GLUCOSEU NEGATIVE 11/29/2021 1517   HGBUR LARGE (A) 05/03/2022 2134   BILIRUBINUR NEGATIVE 05/03/2022 2134   KETONESUR NEGATIVE 05/03/2022 2134   PROTEINUR NEGATIVE 05/03/2022 2134   UROBILINOGEN 0.2 11/29/2021 1517   NITRITE NEGATIVE 05/03/2022 2134   LEUKOCYTESUR LARGE (A) 05/03/2022 2134   Sepsis Labs Recent Labs  Lab 05/03/22 2126 05/06/22 0546 05/07/22 0534 05/08/22 0457  WBC 9.7 6.8 10.0 9.5   Microbiology No results found for this or any previous visit (from the past 240 hour(s)).   Time coordinating discharge: 35 minutes  SIGNED:   Aline August, MD  [facility] 05/08/2022, 10:03 AM

## 2022-05-09 ENCOUNTER — Other Ambulatory Visit: Payer: Self-pay | Admitting: *Deleted

## 2022-05-09 LAB — GLUCOSE, CAPILLARY
Glucose-Capillary: 120 mg/dL — ABNORMAL HIGH (ref 70–99)
Glucose-Capillary: 143 mg/dL — ABNORMAL HIGH (ref 70–99)
Glucose-Capillary: 144 mg/dL — ABNORMAL HIGH (ref 70–99)

## 2022-05-09 NOTE — Patient Outreach (Signed)
  Care Coordination [facility] Note Transition Care Management Follow-up Telephone Call Date of discharge and from where: [phone]  [facility] How have you been since you were released from the hospital? I am still weak and a little dizzy. I am still having a lot of pain several hemorrhoids. I was given some medication and it is better Any questions or concerns? No  Items Reviewed: Did the pt receive and understand the discharge instructions provided? Yes  Medications obtained and verified? Yes  Other? No  Any new allergies since your discharge? No  Dietary orders reviewed? Yes Do you have support at home? Yes   Home Care and Equipment/Supplies: Were home health services ordered? not applicable If so, what is the name of the agency? n  Has the agency set up a time to come to the patient's home? not applicable Were any new equipment or medical supplies ordered?  No What is the name of the medical supply agency? N Were you able to get the supplies/equipment? not applicable Do you have any questions related to the use of the equipment or supplies? No  Functional Questionnaire: (I = Independent and D = Dependent) ADLs: I  Bathing/Dressing- I  Meal Prep- I  Eating- I  Maintaining continence- I  Transferring/Ambulation- I  Managing Meds- I  Follow up appointments reviewed:  PCP Hospital f/u appt confirmed? [facility] f/u appt confirmed? No   Are transportation arrangements needed? No  If their condition worsens, is the pt aware to call PCP or go to the Emergency Dept.? Yes Was the patient provided with contact information for the PCP's office or ED? Yes Was to pt encouraged to call back with questions or concerns? Yes  SDOH assessments and interventions completed:   Yes  Care Coordination Interventions Activated:  Yes Care Coordination Interventions:  PCP follow up appointment requested  Encounter Outcome:  Pt. Visit Completed

## 2022-05-10 ENCOUNTER — Telehealth: Payer: Self-pay

## 2022-05-10 NOTE — Telephone Encounter (Signed)
Transition Care Management Unsuccessful Follow-up Telephone Call  Date of discharge and from where:  05/08/2022  Wendy [facility]   Attempts:  1st Attempt  Reason for unsuccessful TCM follow-up call:  Unable to reach patient

## 2022-05-11 NOTE — Telephone Encounter (Signed)
Transition Care Management Unsuccessful Follow-up Telephone Call  Date of discharge and from where:  05/08/2022  [facility]   Attempts:  2nd Attempt  Reason for unsuccessful TCM follow-up call:  No answer/busy

## 2022-05-12 ENCOUNTER — Other Ambulatory Visit: Payer: Self-pay | Admitting: Family Medicine

## 2022-05-12 ENCOUNTER — Encounter: Payer: Self-pay | Admitting: Family Medicine

## 2022-05-12 ENCOUNTER — Ambulatory Visit (INDEPENDENT_AMBULATORY_CARE_PROVIDER_SITE_OTHER): Payer: Medicare Other | Admitting: Family Medicine

## 2022-05-12 VITALS — BP 134/76 | HR 71 | Temp 98.3°F | Resp 17 | Ht 66.0 in | Wt 227.6 lb

## 2022-05-12 DIAGNOSIS — D649 Anemia, unspecified: Secondary | ICD-10-CM

## 2022-05-12 DIAGNOSIS — K922 Gastrointestinal hemorrhage, unspecified: Secondary | ICD-10-CM

## 2022-05-12 DIAGNOSIS — R5383 Other fatigue: Secondary | ICD-10-CM

## 2022-05-12 DIAGNOSIS — N179 Acute kidney failure, unspecified: Secondary | ICD-10-CM | POA: Diagnosis not present

## 2022-05-12 DIAGNOSIS — K644 Residual hemorrhoidal skin tags: Secondary | ICD-10-CM | POA: Diagnosis not present

## 2022-05-12 DIAGNOSIS — E876 Hypokalemia: Secondary | ICD-10-CM

## 2022-05-12 LAB — CBC WITH DIFFERENTIAL/PLATELET
Basophils Absolute: 0.1 10*3/uL (ref 0.0–0.1)
Basophils Relative: 0.9 % (ref 0.0–3.0)
Eosinophils Absolute: 0.3 10*3/uL (ref 0.0–0.7)
Eosinophils Relative: 4 % (ref 0.0–5.0)
HCT: 26.7 % — ABNORMAL LOW (ref 36.0–46.0)
Hemoglobin: 8.9 g/dL — ABNORMAL LOW (ref 12.0–15.0)
Lymphocytes Relative: 33.9 % (ref 12.0–46.0)
Lymphs Abs: 2.5 10*3/uL (ref 0.7–4.0)
MCHC: 33.2 g/dL (ref 30.0–36.0)
MCV: 92.3 fl (ref 78.0–100.0)
Monocytes Absolute: 0.7 10*3/uL (ref 0.1–1.0)
Monocytes Relative: 10.1 % (ref 3.0–12.0)
Neutro Abs: 3.7 10*3/uL (ref 1.4–7.7)
Neutrophils Relative %: 51.1 % (ref 43.0–77.0)
Platelets: 305 10*3/uL (ref 150.0–400.0)
RBC: 2.89 Mil/uL — ABNORMAL LOW (ref 3.87–5.11)
RDW: 14.8 % (ref 11.5–15.5)
WBC: 7.3 10*3/uL (ref 4.0–10.5)

## 2022-05-12 LAB — BASIC METABOLIC PANEL
BUN: 15 mg/dL (ref 6–23)
CO2: 30 mEq/L (ref 19–32)
Calcium: 8.8 mg/dL (ref 8.4–10.5)
Chloride: 101 mEq/L (ref 96–112)
Creatinine, Ser: 0.97 mg/dL (ref 0.40–1.20)
GFR: 63.95 mL/min (ref >60.00)
Glucose, Bld: 172 mg/dL — ABNORMAL HIGH (ref 70–99)
Potassium: 3.3 mEq/L — ABNORMAL LOW (ref 3.5–5.1)
Sodium: 141 mEq/L (ref 135–145)

## 2022-05-12 MED ORDER — POTASSIUM CHLORIDE CRYS ER 10 MEQ PO TBCR: 20.0000 meq | EXTENDED_RELEASE_TABLET | Freq: Every day | ORAL | 1 refills | Status: DC

## 2022-05-12 NOTE — Progress Notes (Signed)
See lab notes

## 2022-05-12 NOTE — Progress Notes (Signed)
Subjective:  Patient ID: Wendy Barber, female    DOB: November 06, 1960  Age: 61 y.o. MRN: [id number]  CC:  Chief Complaint  Patient presents with   Hospitalization Follow-up    Pt presented to hospital for rectal bleeding advised f/u 1 week with pcp, bleeding has stopped after going for 2.5 days, fatigued, weak and dizzy    HPI Wendy Barber presents for  Hospital follow-up.  Unsuccessful transition of care phone call July 18. Admitted July 11 through July 16 with acute blood loss anemia, painless hematochezia  Anemia, gastrointestinal bleeding History of appendiceal cancer, DVT and PE on Xarelto, presented with rectal bleeding,  possible diverticular bleed.   Did accidentally take extra dose xarelto. Initial hemoglobin 11, trended to 7.8.  Conservative management per GI recommendations, Xarelto was discontinued for now after discussion with hematology. Has IVC filter in place.  No sign of SMV thrombus on last scans per hematology. Plan for CTA vs bleeding scan if continued bleeding.  transfusion if HGB below 7. Low of 7.8 on 7/14. Hemoglobin 8.2 at discharge on 7/16.  Symptoms were improving, tolerating diet at that time. BP meds resumed.   Lab Results  Component Value Date   WBC 9.5 05/08/2022   HGB 8.2 (L) 05/08/2022   HCT 25.4 (L) 05/08/2022   MCV 95.8 05/08/2022   PLT 293 05/08/2022   On initial triage today, felt lightheaded, weak, assisted to chair, then wheelchair. Vitals noted.  some hemorrhoids with minimal blood  on tissue. No return of dark blood or clots. No dark stools. Still feels awful. Overall weak, dizzy. Drinking fluids. Eating meals. Feels about the same as when leaving hospital. Hemorrhoids better with hydrocortisone rectal. Would liek to meet with GI.  No chest pains. Winded with activity, no new cough.  Has walker at home if needed.  Family coming to visit this weekend.   AKI: Creat up to 1.44 on admission. Improved.  Lab Results  Component Value Date    CREATININE 0.88 05/08/2022   Hx of DVT/PE: Off xarelto as above.      History Patient Active Problem List   Diagnosis Date Noted   Acute lower GI bleeding 05/04/2022   AKI (acute kidney injury) ([place]) 05/04/2022   History of deep vein thrombosis (DVT) of lower extremity 05/04/2022   History of pulmonary embolus (PE) 05/04/2022   Anxiety 05/04/2022   Pernicious anemia 05/14/2021   Iron deficiency anemia due to chronic blood loss 05/14/2021   Abdominal wall abscess    Hypokalemia    Surgical wound infection 01/16/2021   C. 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   DVT of deep femoral vein, left ([place]) 03/19/2020   Pulmonary embolism, bilateral ([place]) 03/19/2020   Presence of IVC filter 03/19/2020   Hepatic encephalopathy ([place]) 11/22/2019   Confusion 11/21/2019   Autoimmune hepatitis ([place]) 11/21/2019   HTN (hypertension) 11/21/2019   DM2 (diabetes mellitus, type 2) ([place]) 11/21/2019   AMS (altered mental status) 11/21/2019   Closed right ankle fracture 08/27/2019   Past Medical History:  Diagnosis Date   Arthritis    Back pain    Cancer ([place])    pseudomyxoma peritonei   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[place]) 03/19/2020   Chronic fatigue syndrome    Diabetes mellitus without complication ([place])    DVT of deep femoral vein, left ([place]) 03/19/2020  Fibromyalgia    Goals of care, counseling/discussion 03/19/2020   Hypertension    Iron deficiency anemia due to chronic blood loss 05/14/2021   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   Pernicious anemia 05/14/2021   Presence of IVC filter 03/19/2020   Pulmonary embolism, bilateral ([place]) 03/19/2020   Past Surgical History:  Procedure Laterality Date   ABDOMINAL HYSTERECTOMY     ABDOMINAL SURGERY     ACHILLES TENDON REPAIR      APPENDECTOMY     ARTHROPLASTY     CARPAL TUNNEL RELEASE     CHOLECYSTECTOMY     IR CATHETER TUBE CHANGE  02/02/2021   IR CATHETER TUBE CHANGE  02/25/2021   IR RADIOLOGIST EVAL & MGMT  02/24/2021   IR RADIOLOGIST EVAL & MGMT  03/10/2021   IR US GUIDE BX ASP/DRAIN  11/25/2019   JOINT REPLACEMENT     KNEE ARTHROSCOPY     ORIF ANKLE FRACTURE Right 08/27/2019   Procedure: OPEN REDUCTION INTERNAL FIXATION RIGHT ANKLE FRACTURE;  Surgeon: Renette Butters, MD;  Location: [facility];  Service: Orthopedics;  Laterality: Right;   perineorrophy     TONGUE BIOPSY     Allergies  Allergen Reactions   Penicillins Shortness Of Breath    Other reaction(s): Irregular Heart Rate, Other (See Comments) Rapid heartrate    Alprazolam Hives and Other (See Comments)    Hard to arouse, unresponsiveness   Ativan [Lorazepam] Other (See Comments)    Note: tolerates midazolam fine Face & Throat Swelling.   Corticosteroids Other (See Comments)    Other reaction(s): Other (see comments) Psychotic behaviour     Erythromycin     Other reaction(s): Other (See Comments) Severe stomach pain    Prednisone Other (See Comments)    Anxiety & Nervous Breakdown.   Savella  [Milnacipran]    Povidone Iodine Rash    Had rash under bandage after surgery , has had betadine since without reaction    Prednisolone Anxiety   Prior to Admission medications   Medication Sig Start Date End Date Taking? Authorizing Provider  alum & mag hydroxide-simeth (MAALOX/MYLANTA) 200-200-20 MG/5ML suspension Take 15 mLs by mouth every 6 (six) hours as needed for indigestion or heartburn. 05/08/22   Aline August, MD  buPROPion (WELLBUTRIN XL) 150 MG 24 hr tablet Take 1 tablet (150 mg total) by mouth daily. 04/27/22   Elwanda Brooklyn, NP  busPIRone (BUSPAR) 15 MG tablet Take 1 tablet (15 mg total) by mouth 3 (three) times daily. 04/05/22   Elwanda Brooklyn, NP  citalopram (CELEXA) 40 MG tablet Take 1 tablet (40 mg total) by mouth daily. 04/27/22    Elwanda Brooklyn, NP  dicyclomine (BENTYL) 20 MG tablet Take 1 tablet (20 mg total) by mouth in the morning, at noon, in the evening, and at bedtime. 05/08/22   Aline August, MD  diphenoxylate-atropine (LOMOTIL) 2.5-0.025 MG tablet TAKE 2 TABLETS IN THE MORNING, AT NOON, IN THE EVENING AND AT BEDTIME Patient taking differently: Take 2 tablets by mouth 4 (four) times daily. 04/22/22   Volanda Napoleon, MD  eszopiclone (LUNESTA) 2 MG TABS tablet Take 1 tablet (2 mg total) by mouth at bedtime as needed for sleep. Take immediately before bedtime 11/22/21   Lesle Chris A, NP  famotidine (PEPCID) 20 MG tablet TAKE 2 TABLETS TWICE A DAY Patient taking differently: Take 40 mg by mouth 2 (two) times daily. 03/01/22   Volanda Napoleon, MD  folic acid (FOLVITE) 1 MG tablet Take 2  tablets (2 mg total) by mouth daily. 05/09/22   Aline August, MD  hydrocortisone (ANUSOL-HC) 2.5 % rectal cream Place rectally 2 (two) times daily. 05/08/22   Aline August, MD  loperamide (IMODIUM) 2 MG capsule Take 2 capsules (4 mg total) by mouth 4 (four) times daily as needed for diarrhea or loose stools. 05/08/22   Aline August, MD  ondansetron (ZOFRAN) 8 MG tablet Take 8 mg by mouth every 8 (eight) hours as needed for nausea. 03/03/21   [provider]  orphenadrine (NORFLEX) 100 MG tablet Take 1 tablet (100 mg total) by mouth at bedtime as needed for muscle spasms. 09/29/21   Volanda Napoleon, MD  pantoprazole (PROTONIX) 40 MG tablet Take 40 mg by mouth every morning. 01/01/21   [provider]  promethazine (PHENERGAN) 12.5 MG tablet Take 1 tablet (12.5 mg total) by mouth 2 (two) times daily as needed. Patient taking differently: Take 12.5 mg by mouth 2 (two) times daily as needed for nausea. 04/21/22   [name], MD  propranolol (INDERAL) 20 MG tablet Take 1 tablet (20 mg total) by mouth 2 (two) times daily. 04/21/22   [name], MD  traMADol (ULTRAM) 50 MG tablet Take 1 tablet (50 mg total) by mouth  every 6 (six) hours as needed for moderate pain. 05/08/22   Aline August, MD  valsartan-hydrochlorothiazide (DIOVAN-HCT) 160-12.5 MG tablet Take 1 tablet by mouth daily. 03/02/22   [provider]   Social History   Socioeconomic History   Marital status: Married    Spouse name: Not on file   Number of children: 3   Years of education: Not on file   Highest education level: Some college, no degree  Occupational History   Occupation: disability  Tobacco Use   Smoking status: Never   Smokeless tobacco: Never  Vaping Use   Vaping Use: Never used  Substance and Sexual Activity   Alcohol use: No   Drug use: Never   Sexual activity: Yes    Birth control/protection: None    Comment: hysterectomy  Other Topics Concern   Not on file  Social History Narrative   Right handed   One story home   Drinks occasional caffeine   Social Determinants of Health   Financial Resource Strain: Not on file  Food Insecurity: Not on file  Transportation Needs: Not on file  Physical Activity: Not on file  Stress: Not on file  Social Connections: Not on file  Intimate Partner Violence: Not on file    Review of Systems   Objective:   Vitals:   05/12/22 1212  BP: 134/76  Pulse: 71  Resp: 17  Temp: 98.3 F (36.8 C)  TempSrc: Oral  SpO2: 97%  Weight: 227 lb 9.6 oz (103.2 kg)  Height: '5\' 6"'$  (1.676 m)     Physical Exam Vitals reviewed.  Constitutional:      General: She is not in acute distress.    Appearance: Normal appearance. She is well-developed. She is not ill-appearing or toxic-appearing.     Comments: Initially appeared uncomfortable, lightheaded with walking to room, but seated in room no distress, nontoxic, appropriate interaction.  Appears comfortable.  HENT:     Head: Normocephalic and atraumatic.  Eyes:     Conjunctiva/sclera: Conjunctivae normal.     Pupils: Pupils are equal, round, and reactive to light.  Neck:     Vascular: No carotid bruit.   Cardiovascular:     Rate and Rhythm: Normal rate and  regular rhythm.     Heart sounds: Normal heart sounds.  Pulmonary:     Effort: Pulmonary effort is normal.     Breath sounds: Normal breath sounds.  Abdominal:     General: There is no distension.     Palpations: Abdomen is soft. There is no pulsatile mass.     Tenderness: There is no abdominal tenderness.  Musculoskeletal:     Right lower leg: No edema.     Left lower leg: No edema.  Skin:    General: Skin is warm and dry.  Neurological:     General: No focal deficit present.     Mental Status: She is alert and oriented to person, place, and time.  Psychiatric:        Mood and Affect: Mood normal.        Behavior: Behavior normal.        Assessment & Plan:  Wendy Barber is a 61 y.o. female . Lower GI bleed - Plan: Ambulatory referral to Gastroenterology  Anemia, unspecified type - Plan: CBC with Differential/Platelet  AKI (acute kidney injury) ([place]) - Plan: Basic metabolic panel  External hemorrhoids - Plan: Ambulatory referral to Gastroenterology  Fatigue, unspecified type  Recent hospitalization as above for lower gastrointestinal bleeding.  Potentially related to additional dose of Xarelto as well as diverticular or other lower bleed.  Did not require transfusion at that time.  Bleeding stopped, denies recurrence of significant bleeding other than small amount of blood on tissue from external hemorrhoids.  Has had some persistent fatigue, lightheadedness.  Reports sufficient hydration, calorie intake.  Blood pressure, vitals stable in office.  -Check CBC, and if any lower will have her be seen through emergency room.  Also advised if any worsening lightheadedness, dizziness to call 911, be seen in ER.  Recommended against walking without assistive device for now, has walker at home.  -Check BMP with prior AKI that improved during hospitalization.  -Refer to gastroenterology for follow-up of lower GI bleed and  external hemorrhoid treatment options.  No orders of the defined types were placed in this encounter.  Patient Instructions  I will check blood counts and electrolytes today.  I recommend using a walker or other assistive device for now to minimize risk of falling.  If you are having any increasing lightheadedness, dizziness, new bleeding or dark stools proceed to the emergency room or call 911.  I placed a referral to gastroenterology.  Return to the clinic or go to the nearest emergency room if any of your symptoms worsen or new symptoms occur.     Signed,   Merri Ray, MD [facility] 05/12/22 1:07 PM

## 2022-05-12 NOTE — Patient Instructions (Signed)
I will check blood counts and electrolytes today.  I recommend using a walker or other assistive device for now to minimize risk of falling.  If you are having any increasing lightheadedness, dizziness, new bleeding or dark stools proceed to the emergency room or call 911.  I placed a referral to gastroenterology.  Return to the clinic or go to the nearest emergency room if any of your symptoms worsen or new symptoms occur.

## 2022-05-25 ENCOUNTER — Encounter: Payer: Self-pay | Admitting: Family Medicine

## 2022-05-27 ENCOUNTER — Telehealth: Payer: Medicare Other | Admitting: Behavioral Health

## 2022-05-30 ENCOUNTER — Encounter: Payer: Self-pay | Admitting: Family Medicine

## 2022-05-30 ENCOUNTER — Ambulatory Visit (INDEPENDENT_AMBULATORY_CARE_PROVIDER_SITE_OTHER): Payer: Medicare Other | Admitting: Family Medicine

## 2022-05-30 VITALS — BP 140/80 | HR 86 | Temp 98.0°F | Resp 19 | Ht 66.0 in | Wt 229.0 lb

## 2022-05-30 DIAGNOSIS — R531 Weakness: Secondary | ICD-10-CM

## 2022-05-30 DIAGNOSIS — R110 Nausea: Secondary | ICD-10-CM

## 2022-05-30 DIAGNOSIS — R5383 Other fatigue: Secondary | ICD-10-CM

## 2022-05-30 DIAGNOSIS — L299 Pruritus, unspecified: Secondary | ICD-10-CM

## 2022-05-30 DIAGNOSIS — H8112 Benign paroxysmal vertigo, left ear: Secondary | ICD-10-CM

## 2022-05-30 DIAGNOSIS — D649 Anemia, unspecified: Secondary | ICD-10-CM | POA: Diagnosis not present

## 2022-05-30 LAB — CBC
HCT: 28.3 % — ABNORMAL LOW (ref 36.0–46.0)
Hemoglobin: 9.1 g/dL — ABNORMAL LOW (ref 12.0–15.0)
MCHC: 32.1 g/dL (ref 30.0–36.0)
MCV: 87.7 fl (ref 78.0–100.0)
Platelets: 367 10*3/uL (ref 150.0–400.0)
RBC: 3.23 Mil/uL — ABNORMAL LOW (ref 3.87–5.11)
RDW: 15.8 % — ABNORMAL HIGH (ref 11.5–15.5)
WBC: 7.8 10*3/uL (ref 4.0–10.5)

## 2022-05-30 MED ORDER — MECLIZINE HCL 25 MG PO TABS: 25.0000 mg | ORAL_TABLET | Freq: Three times a day (TID) | ORAL | 0 refills | Status: DC | PRN

## 2022-05-30 NOTE — Progress Notes (Signed)
Subjective:  Patient ID: Wendy Barber, female    DOB: Mar 27, 1961  Age: 61 y.o. MRN: [id number]  CC:  Chief Complaint  Patient presents with   Fatigue    Been weak since hospital visit, along with dizzy spells for several months had an appointment for MRI while in the hospital but had to cancel until Aug 15, dizziness is worse when she turns her head to the left    HPI Wendy Barber presents for   Fatigue, dizziness: Follow-up from July 20 visit.  History of appendiceal cancer, DVT and PE on Xarelto.  Was admitted July 11 through July 16 with acute blood loss anemia, possible diverticular bleed.  Hemoglobin 11 trended to 7.8 with conservative management under the care of GI.  Xarelto was discontinued after discussion with hematology as she has IVC filter in place.  Plan for CTA versus bleeding scan if continued bleeding and transfusion if hemoglobin below 7.  Hemoglobin was 8.2 at discharge on July 16.  She was lightheaded, weak on her initial triage at her July 20 visit.  Persistent weak, dizzy feeling at that time but was drinking fluids, eating meals.  Felt about the same as when leaving the hospital.  Hemorrhoids that improved with hydrocortisone rectal.  CBC, BMP obtained with previous anemia and acute kidney injury that was improving during hospitalization(creatinine up to 1.44 on admission).  Both improved on labs at her July 20 visit. Lab Results  Component Value Date   WBC 7.3 05/12/2022   HGB 8.9 Repeated and verified X2. (L) 05/12/2022   HCT 26.7 (L) 05/12/2022   MCV 92.3 05/12/2022   PLT 305.0 05/12/2022   Lab Results  Component Value Date   CREATININE 0.97 05/12/2022   Today states she is continuing to feel dizzy, with generalized weakness but also dizzy spells for the past few months. Still having episodes of nausea - notes with activity - throughout the day. Separate from the dizzy spells.  Taking zofran once per day, promethazine nightly, sometimes during the day.   Has a walker/rollator at home - not using. Has had a few falls without injuries.  No focal weakness no vomiting. Improves with lying down for a few minutes. whole body aches, feels weak all over, unsteady on her feet. About the same as last visit.  No blood in stool or dark tarry stools.  No fever.  No rash, but itching all over past few weeks. Benadryl cream min relief.  Similar itching in past with elevated LFT"s with autoimmune hepatitis.  Only occasional abdominal pain, chronic pain and may be better than in past.  No tick bites.  Drinking water throughout the day, late breakfast, 1 large meal during the day. Some snacks in between.  Has not heard from GI yet about appt.  She was seen July 5 by neurology, Dr. Tomi Likens.  MRI brain was ordered with and without contrast to evaluate for burning tongue syndrome as well as follow-up MRI to monitor meningioma.  Thought to have benign paroxysmal positional vertigo meds were deferred for treating neuralgia.  Depending on MRI, possible PT for vestibular rehab.  CT head 12/01/2021:Similar posterior fossa meningioma without parenchymal edema. No new mass or abnormal enhancement.   MRI was delayed with recent hospitalization but scheduled for the August 15.  Does notice worsening dizziness with head movement turning head to left.   Lab Results  Component Value Date   ALT 24 05/08/2022   AST 39 05/08/2022   ALKPHOS 91 05/08/2022  BILITOT 0.3 05/08/2022      History Patient Active Problem List   Diagnosis Date Noted   Acute lower GI bleeding 05/04/2022   AKI (acute kidney injury) ([place]) 05/04/2022   History of deep vein thrombosis (DVT) of lower extremity 05/04/2022   History of pulmonary embolus (PE) 05/04/2022   Anxiety 05/04/2022   Pernicious anemia 05/14/2021   Iron deficiency anemia due to chronic blood loss 05/14/2021   Abdominal wall abscess    Hypokalemia    Surgical wound infection 01/16/2021   C. 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   DVT of deep femoral vein, left ([place]) 03/19/2020   Pulmonary embolism, bilateral ([place]) 03/19/2020   Presence of IVC filter 03/19/2020   Hepatic encephalopathy ([place]) 11/22/2019   Confusion 11/21/2019   Autoimmune hepatitis ([place]) 11/21/2019   HTN (hypertension) 11/21/2019   DM2 (diabetes mellitus, type 2) ([place]) 11/21/2019   AMS (altered mental status) 11/21/2019   Closed right ankle fracture 08/27/2019   Past Medical History:  Diagnosis Date   Arthritis    Back pain    Cancer ([place])    pseudomyxoma peritonei   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   Pernicious anemia 05/14/2021   Presence of IVC filter 03/19/2020   Pulmonary embolism, bilateral ([place]) 03/19/2020   Past Surgical History:  Procedure Laterality Date   ABDOMINAL HYSTERECTOMY     ABDOMINAL SURGERY     ACHILLES TENDON REPAIR     APPENDECTOMY     ARTHROPLASTY     CARPAL TUNNEL RELEASE     CHOLECYSTECTOMY     IR CATHETER TUBE CHANGE  02/02/2021   IR CATHETER TUBE CHANGE  02/25/2021   IR RADIOLOGIST EVAL & MGMT  02/24/2021   IR RADIOLOGIST EVAL & MGMT  03/10/2021   IR US GUIDE BX ASP/DRAIN  11/25/2019   JOINT REPLACEMENT     KNEE ARTHROSCOPY     ORIF ANKLE FRACTURE Right 08/27/2019   Procedure: OPEN REDUCTION INTERNAL FIXATION RIGHT ANKLE FRACTURE;  Surgeon: Renette Butters, MD;  Location: [facility];  Service: Orthopedics;  Laterality: Right;   perineorrophy     TONGUE BIOPSY      Allergies  Allergen Reactions   Penicillins Shortness Of Breath    Other reaction(s): Irregular Heart Rate, Other (See Comments) Rapid heartrate    Alprazolam Hives and Other (See Comments)    Hard to arouse, unresponsiveness   Ativan [Lorazepam] Other (See Comments)    Note: tolerates midazolam fine Face & Throat Swelling.   Corticosteroids Other (See Comments)    Other reaction(s): Other (see comments) Psychotic behaviour     Erythromycin     Other reaction(s): Other (See Comments) Severe stomach pain    Prednisone Other (See Comments)    Anxiety & Nervous Breakdown.   Savella  [Milnacipran]    Povidone Iodine Rash    Had rash under bandage after surgery , has had betadine since without reaction    Prednisolone Anxiety   Prior to Admission medications  Medication Sig Start Date End Date Taking? Authorizing Provider  alum & mag hydroxide-simeth (MAALOX/MYLANTA) 200-200-20 MG/5ML suspension Take 15 mLs by mouth every 6 (six) hours as needed for indigestion or heartburn. 05/08/22  Yes Aline August, MD  buPROPion (WELLBUTRIN XL) 150 MG 24 hr tablet Take 1 tablet (150 mg total) by mouth daily. 04/27/22  Yes White, Aaron Edelman A, NP  busPIRone (BUSPAR) 15 MG tablet Take 1 tablet (15 mg total) by mouth 3 (three) times daily. 04/05/22  Yes Lesle Chris A, NP  citalopram (CELEXA) 40 MG tablet Take 1 tablet (40 mg total) by mouth daily. 04/27/22  Yes White, Louanna Raw, NP  dicyclomine (BENTYL) 20 MG tablet Take 1 tablet (20 mg total) by mouth in the morning, at noon, in the evening, and at bedtime. 05/08/22  Yes Aline August, MD  diphenoxylate-atropine (LOMOTIL) 2.5-0.025 MG tablet TAKE 2 TABLETS IN THE MORNING, AT NOON, IN THE EVENING AND AT BEDTIME Patient taking differently: Take 2 tablets by mouth 4 (four) times daily. 04/22/22  Yes Ennever, Rudell Cobb, MD  eszopiclone (LUNESTA) 2 MG TABS tablet Take 1 tablet (2 mg total) by mouth at bedtime as needed for sleep. Take immediately before bedtime  11/22/21  Yes White, Brian A, NP  famotidine (PEPCID) 20 MG tablet TAKE 2 TABLETS TWICE A DAY Patient taking differently: Take 40 mg by mouth 2 (two) times daily. 03/01/22  Yes Ennever, Rudell Cobb, MD  folic acid (FOLVITE) 1 MG tablet Take 2 tablets (2 mg total) by mouth daily. 05/09/22  Yes Aline August, MD  hydrocortisone (ANUSOL-HC) 2.5 % rectal cream Place rectally 2 (two) times daily. 05/08/22  Yes Aline August, MD  loperamide (IMODIUM) 2 MG capsule Take 2 capsules (4 mg total) by mouth 4 (four) times daily as needed for diarrhea or loose stools. 05/08/22  Yes Aline August, MD  ondansetron (ZOFRAN) 8 MG tablet Take 8 mg by mouth every 8 (eight) hours as needed for nausea. 03/03/21  Yes [provider]  orphenadrine (NORFLEX) 100 MG tablet Take 1 tablet (100 mg total) by mouth at bedtime as needed for muscle spasms. 09/29/21  Yes Volanda Napoleon, MD  pantoprazole (PROTONIX) 40 MG tablet Take 40 mg by mouth every morning. 01/01/21  Yes [provider]  potassium chloride SA (KLOR-CON M) 10 MEQ tablet Take 2 tablets (20 mEq total) by mouth daily. 05/12/22  Yes [name], MD  promethazine (PHENERGAN) 12.5 MG tablet Take 1 tablet (12.5 mg total) by mouth 2 (two) times daily as needed. Patient taking differently: Take 12.5 mg by mouth 2 (two) times daily as needed for nausea. 04/21/22  Yes [name], MD  propranolol (INDERAL) 20 MG tablet Take 1 tablet (20 mg total) by mouth 2 (two) times daily. 04/21/22  Yes [name], MD  valsartan-hydrochlorothiazide (DIOVAN-HCT) 160-12.5 MG tablet Take 1 tablet by mouth daily. 03/02/22  Yes [provider]  traMADol (ULTRAM) 50 MG tablet Take 1 tablet (50 mg total) by mouth every 6 (six) hours as needed for moderate pain. Patient not taking: Reported on 05/30/2022 05/08/22   Aline August, MD   Social History   Socioeconomic History   Marital status: Married    Spouse name: Not on file   Number of children: 3   Years  of education: Not on file   Highest education level: Some college, no degree  Occupational History   Occupation: disability  Tobacco Use   Smoking status: Never   Smokeless tobacco: Never  Vaping Use   Vaping Use: Never used  Substance and Sexual Activity   Alcohol use: No   Drug use: Never   Sexual activity: Yes    Birth control/protection: None    Comment: hysterectomy  Other Topics Concern   Not on file  Social History Narrative   Right handed   One story home   Drinks occasional caffeine   Social Determinants of Health   Financial Resource Strain: Not on file  Food Insecurity: Not on file  Transportation Needs: Not on file  Physical Activity: Not on file  Stress: Not on file  Social Connections: Not on file  Intimate Partner Violence: Not on file    Review of Systems Per HPI.   Objective:   Vitals:   05/30/22 1142 05/30/22 1201  BP: 124/84 (!) 140/80  Pulse: 86   Resp: 19   Temp: 98 F (36.7 C)   SpO2: 93%   Weight: 229 lb (103.9 kg)   Height: '5\' 6"'$  (1.676 m)      Physical Exam Vitals reviewed.  Constitutional:      General: She is not in acute distress.    Appearance: Normal appearance. She is well-developed. She is not ill-appearing, toxic-appearing or diaphoretic.  HENT:     Head: Normocephalic and atraumatic.  Eyes:     Extraocular Movements:     Right eye: Nystagmus (Horizontal with left neck rotation.) present.     Left eye: Nystagmus present.     Conjunctiva/sclera: Conjunctivae normal.     Pupils: Pupils are equal, round, and reactive to light.  Neck:     Vascular: No carotid bruit.  Cardiovascular:     Rate and Rhythm: Normal rate and regular rhythm.     Heart sounds: Normal heart sounds.  Pulmonary:     Effort: Pulmonary effort is normal.     Breath sounds: Normal breath sounds.  Abdominal:     General: There is no distension.     Palpations: Abdomen is soft. There is no pulsatile mass.     Tenderness: There is no abdominal  tenderness. There is no guarding.  Musculoskeletal:     Right lower leg: No edema.     Left lower leg: No edema.  Skin:    General: Skin is warm and dry.     Findings: No rash.     Comments: Skin intact without rash or urticarial lesions.  Neurological:     General: No focal deficit present.     Mental Status: She is alert and oriented to person, place, and time.     Comments: No facial droop, normal speech.  Upper extremity, lower extremity equal movements, no weakness.  No pronator drift. Reproduction of dizziness with looking left, horizontal nystagmus noted with looking left.  Psychiatric:        Mood and Affect: Mood normal.        Behavior: Behavior normal.        Assessment & Plan:  Wendy Barber is a 61 y.o. female . Fatigue, unspecified type - Plan: CBC, Comprehensive metabolic panel General weakness - Plan: Comprehensive metabolic panel Anemia, unspecified type - Plan: CBC Benign paroxysmal positional vertigo of left ear - Plan: meclizine (ANTIVERT) 25 MG tablet  -Fatigue May be multifactorial.  Prior anemia, improving at last visit but will recheck CBC.  Nonfocal neuro exam in office with exception of reproducible dizziness with left neck rotation, suspected BPPV, also noted at her most recent neuro visit.  Will try low-dose meclizine,  plan for MRI as above.  ER precautions if any acute changes.  Check CMP to evaluate electrolytes and with itching, prior itching with hepatitis by history.  Chronic nausea - Plan: Comprehensive metabolic panel -Has Zofran, promethazine.  Abdomen nontender.  Gastroenterology follow-up planned.  Pruritus - Plan: Comprehensive metabolic panel  -No rash, or prior rash, no known insect bites or tick bites.  Check labs as above including LFTs, trial of Sarna lotion, continue Aveeno, option of other antihistamine if not improved.  RTC precautions.  ER/RTC precautions also discussed if any new or worsening symptoms prior to planned follow-up in  2 weeks.  All questions were answered.  Meds ordered this encounter  Medications   meclizine (ANTIVERT) 25 MG tablet    Sig: Take 1 tablet (25 mg total) by mouth 3 (three) times daily as needed for dizziness.    Dispense:  30 tablet    Refill:  0   Patient Instructions  Use a walker at all times for now to help lessen risk of falls.  I will check electrolytes, liver tests, and blood counts.  Continue Aveeno lotion or Sarna for itching if needed for now. If any rash be seen in right away. Try meclizine for possible vertigo as discussed with neurology. Make sure to have regular meals or snacks throughout the day and fluid intake.  Recheck in 2 weeks, but if any worsening or new symptoms be seen in ER. Hang in there!  Return to the clinic or go to the nearest emergency room if any of your symptoms worsen or new symptoms occur.  Vertigo Vertigo is the feeling that you or your surroundings are moving when they are not. This feeling can come and go at any time. Vertigo often goes away on its own. Vertigo can be dangerous if it occurs while you are doing something that could endanger yourself or others, such as driving or operating machinery. Your health care provider will do tests to try to determine the cause of your vertigo. Tests will also help your health care provider decide how best to treat your condition. Follow these instructions at home: Eating and drinking     Dehydration can make vertigo worse. Drink enough fluid to keep your urine pale yellow. Do not drink alcohol. Activity Return to your normal activities as told by your health care provider. Ask your health care provider what activities are safe for you. In the morning, first sit up on the side of the bed. When you feel okay, stand slowly while you hold onto something until you know that your balance is fine. Move slowly. Avoid sudden body or head movements or certain positions, as told by your health care provider. If you  have trouble walking or keeping your balance, try using a cane for stability. If you feel dizzy or unstable, sit down right away. Avoid doing any tasks that would cause danger to you or others if vertigo occurs. Avoid bending down if you feel dizzy. Place items in your home so that they are easy for you to reach without bending or leaning over. Do not drive or use machinery if you feel dizzy. General instructions Take over-the-counter and prescription medicines only as told by your health care provider. Keep all follow-up visits. This is important. Contact a health care provider if: Your medicines do not relieve your vertigo or they make it worse. Your condition gets worse or you develop new symptoms. You have a fever. You develop nausea or vomiting, or if  nausea gets worse. Your family or friends notice any behavioral changes. You have numbness or a prickling and tingling sensation in part of your body. Get help right away if you: Are always dizzy or you faint. Develop severe headaches. Develop a stiff neck. Develop sensitivity to light. Have difficulty moving or speaking. Have weakness in your hands, arms, or legs. Have changes in your hearing or vision. These symptoms may represent a serious problem that is an emergency. Do not wait to see if the symptoms will go away. Get medical help right away. Call your local emergency services (911 in the [place] Do not drive yourself to the hospital. Summary Vertigo is the feeling that you or your surroundings are moving when they are not. Your health care provider will do tests to try to determine the cause of your vertigo. Follow instructions for home care. You may be told to avoid certain tasks, positions, or movements. Contact a health care provider if your medicines do not relieve your symptoms, or if you have a fever, nausea, vomiting, or changes in behavior. Get help right away if you have severe headaches or difficulty speaking, or you  develop hearing or vision problems. This information is not intended to replace advice given to you by your health care provider. Make sure you discuss any questions you have with your health care provider. Document Revised: 09/09/2020 Document Reviewed: 09/09/2020 Elsevier Patient Education  2023 [place],   Merri Ray, MD [facility] 05/30/22 1:00 PM

## 2022-05-30 NOTE — Patient Instructions (Addendum)
Use a walker at all times for now to help lessen risk of falls.  I will check electrolytes, liver tests, and blood counts.  Continue Aveeno lotion or Sarna for itching if needed for now. If any rash be seen in right away. Try meclizine for possible vertigo as discussed with neurology. Make sure to have regular meals or snacks throughout the day and fluid intake.  Recheck in 2 weeks, but if any worsening or new symptoms be seen in ER. Hang in there!  Return to the clinic or go to the nearest emergency room if any of your symptoms worsen or new symptoms occur.  Vertigo Vertigo is the feeling that you or your surroundings are moving when they are not. This feeling can come and go at any time. Vertigo often goes away on its own. Vertigo can be dangerous if it occurs while you are doing something that could endanger yourself or others, such as driving or operating machinery. Your health care provider will do tests to try to determine the cause of your vertigo. Tests will also help your health care provider decide how best to treat your condition. Follow these instructions at home: Eating and drinking     Dehydration can make vertigo worse. Drink enough fluid to keep your urine pale yellow. Do not drink alcohol. Activity Return to your normal activities as told by your health care provider. Ask your health care provider what activities are safe for you. In the morning, first sit up on the side of the bed. When you feel okay, stand slowly while you hold onto something until you know that your balance is fine. Move slowly. Avoid sudden body or head movements or certain positions, as told by your health care provider. If you have trouble walking or keeping your balance, try using a cane for stability. If you feel dizzy or unstable, sit down right away. Avoid doing any tasks that would cause danger to you or others if vertigo occurs. Avoid bending down if you feel dizzy. Place items in your home so  that they are easy for you to reach without bending or leaning over. Do not drive or use machinery if you feel dizzy. General instructions Take over-the-counter and prescription medicines only as told by your health care provider. Keep all follow-up visits. This is important. Contact a health care provider if: Your medicines do not relieve your vertigo or they make it worse. Your condition gets worse or you develop new symptoms. You have a fever. You develop nausea or vomiting, or if nausea gets worse. Your family or friends notice any behavioral changes. You have numbness or a prickling and tingling sensation in part of your body. Get help right away if you: Are always dizzy or you faint. Develop severe headaches. Develop a stiff neck. Develop sensitivity to light. Have difficulty moving or speaking. Have weakness in your hands, arms, or legs. Have changes in your hearing or vision. These symptoms may represent a serious problem that is an emergency. Do not wait to see if the symptoms will go away. Get medical help right away. Call your local emergency services (911 in the [place] Do not drive yourself to the hospital. Summary Vertigo is the feeling that you or your surroundings are moving when they are not. Your health care provider will do tests to try to determine the cause of your vertigo. Follow instructions for home care. You may be told to avoid certain tasks, positions, or movements. Contact a health care provider  if your medicines do not relieve your symptoms, or if you have a fever, nausea, vomiting, or changes in behavior. Get help right away if you have severe headaches or difficulty speaking, or you develop hearing or vision problems. This information is not intended to replace advice given to you by your health care provider. Make sure you discuss any questions you have with your health care provider. Document Revised: 09/09/2020 Document Reviewed: 09/09/2020 Elsevier  Patient Education  [place].

## 2022-05-31 ENCOUNTER — Emergency Department (HOSPITAL_COMMUNITY): Payer: Medicare Other

## 2022-05-31 ENCOUNTER — Encounter (HOSPITAL_COMMUNITY): Payer: Self-pay

## 2022-05-31 ENCOUNTER — Observation Stay (HOSPITAL_COMMUNITY): Payer: Medicare Other

## 2022-05-31 ENCOUNTER — Other Ambulatory Visit: Payer: Self-pay

## 2022-05-31 ENCOUNTER — Inpatient Hospital Stay (HOSPITAL_COMMUNITY)
Admission: EM | Admit: 2022-05-31 | Discharge: 2022-06-04 | DRG: 683 | Disposition: A | Discharge status: Home Health | Payer: Medicare Other | Attending: Internal Medicine | Admitting: Internal Medicine

## 2022-05-31 ENCOUNTER — Encounter: Payer: Self-pay | Admitting: Neurology

## 2022-05-31 DIAGNOSIS — E1169 Type 2 diabetes mellitus with other specified complication: Secondary | ICD-10-CM

## 2022-05-31 DIAGNOSIS — E669 Obesity, unspecified: Secondary | ICD-10-CM | POA: Diagnosis present

## 2022-05-31 DIAGNOSIS — E860 Dehydration: Secondary | ICD-10-CM | POA: Diagnosis present

## 2022-05-31 DIAGNOSIS — Z881 Allergy status to other antibiotic agents status: Secondary | ICD-10-CM

## 2022-05-31 DIAGNOSIS — D500 Iron deficiency anemia secondary to blood loss (chronic): Secondary | ICD-10-CM | POA: Diagnosis not present

## 2022-05-31 DIAGNOSIS — Z9660 Presence of unspecified orthopedic joint implant: Secondary | ICD-10-CM | POA: Diagnosis present

## 2022-05-31 DIAGNOSIS — R55 Syncope and collapse: Secondary | ICD-10-CM | POA: Diagnosis present

## 2022-05-31 DIAGNOSIS — Z86011 Personal history of benign neoplasm of the brain: Secondary | ICD-10-CM

## 2022-05-31 DIAGNOSIS — M1990 Unspecified osteoarthritis, unspecified site: Secondary | ICD-10-CM | POA: Diagnosis present

## 2022-05-31 DIAGNOSIS — Z880 Allergy status to penicillin: Secondary | ICD-10-CM

## 2022-05-31 DIAGNOSIS — I10 Essential (primary) hypertension: Secondary | ICD-10-CM | POA: Diagnosis not present

## 2022-05-31 DIAGNOSIS — Z813 Family history of other psychoactive substance abuse and dependence: Secondary | ICD-10-CM

## 2022-05-31 DIAGNOSIS — Z86711 Personal history of pulmonary embolism: Secondary | ICD-10-CM | POA: Diagnosis present

## 2022-05-31 DIAGNOSIS — C786 Secondary malignant neoplasm of retroperitoneum and peritoneum: Secondary | ICD-10-CM | POA: Diagnosis present

## 2022-05-31 DIAGNOSIS — C772 Secondary and unspecified malignant neoplasm of intra-abdominal lymph nodes: Secondary | ICD-10-CM | POA: Diagnosis present

## 2022-05-31 DIAGNOSIS — E119 Type 2 diabetes mellitus without complications: Secondary | ICD-10-CM

## 2022-05-31 DIAGNOSIS — Z85038 Personal history of other malignant neoplasm of large intestine: Secondary | ICD-10-CM

## 2022-05-31 DIAGNOSIS — I959 Hypotension, unspecified: Secondary | ICD-10-CM | POA: Diagnosis present

## 2022-05-31 DIAGNOSIS — N179 Acute kidney failure, unspecified: Secondary | ICD-10-CM | POA: Diagnosis not present

## 2022-05-31 DIAGNOSIS — Z6838 Body mass index (BMI) 38.0-38.9, adult: Secondary | ICD-10-CM

## 2022-05-31 DIAGNOSIS — M797 Fibromyalgia: Secondary | ICD-10-CM | POA: Diagnosis present

## 2022-05-31 DIAGNOSIS — Z818 Family history of other mental and behavioral disorders: Secondary | ICD-10-CM

## 2022-05-31 DIAGNOSIS — Z79899 Other long term (current) drug therapy: Secondary | ICD-10-CM

## 2022-05-31 DIAGNOSIS — Z888 Allergy status to other drugs, medicaments and biological substances status: Secondary | ICD-10-CM

## 2022-05-31 DIAGNOSIS — Z811 Family history of alcohol abuse and dependence: Secondary | ICD-10-CM

## 2022-05-31 DIAGNOSIS — I6522 Occlusion and stenosis of left carotid artery: Secondary | ICD-10-CM | POA: Diagnosis present

## 2022-05-31 LAB — CBC WITH DIFFERENTIAL/PLATELET
Abs Immature Granulocytes: 0.07 10*3/uL (ref 0.00–0.07)
Basophils Absolute: 0.1 10*3/uL (ref 0.0–0.1)
Basophils Relative: 1 %
Eosinophils Absolute: 0.1 10*3/uL (ref 0.0–0.5)
Eosinophils Relative: 1 %
HCT: 27 % — ABNORMAL LOW (ref 36.0–46.0)
Hemoglobin: 8.3 g/dL — ABNORMAL LOW (ref 12.0–15.0)
Immature Granulocytes: 1 %
Lymphocytes Relative: 23 %
Lymphs Abs: 2.7 10*3/uL (ref 0.7–4.0)
MCH: 28 pg (ref 26.0–34.0)
MCHC: 30.7 g/dL (ref 30.0–36.0)
MCV: 91.2 fL (ref 80.0–100.0)
Monocytes Absolute: 0.7 10*3/uL (ref 0.1–1.0)
Monocytes Relative: 6 %
Neutro Abs: 8.4 10*3/uL — ABNORMAL HIGH (ref 1.7–7.7)
Neutrophils Relative %: 68 %
Platelets: 238 10*3/uL (ref 150–400)
RBC: 2.96 MIL/uL — ABNORMAL LOW (ref 3.87–5.11)
RDW: 15.5 % (ref 11.5–15.5)
WBC: 12.1 10*3/uL — ABNORMAL HIGH (ref 4.0–10.5)
nRBC: 0 % (ref 0.0–0.2)

## 2022-05-31 LAB — COMPREHENSIVE METABOLIC PANEL
ALT: 14 U/L (ref 0–35)
ALT: 16 U/L (ref 0–44)
AST: 20 U/L (ref 0–37)
AST: 20 U/L (ref 15–41)
Albumin: 4 g/dL (ref 3.5–5.2)
Albumin: 3.5 g/dL (ref 3.5–5.0)
Alkaline Phosphatase: 146 U/L — ABNORMAL HIGH (ref 39–117)
Alkaline Phosphatase: 141 U/L — ABNORMAL HIGH (ref 38–126)
Anion gap: 10 (ref 5–15)
BUN: 21 mg/dL (ref 6–23)
BUN: 24 mg/dL — ABNORMAL HIGH (ref 6–20)
CO2: 26 mEq/L (ref 19–32)
CO2: 23 mmol/L (ref 22–32)
Calcium: 9.5 mg/dL (ref 8.4–10.5)
Calcium: 8.7 mg/dL — ABNORMAL LOW (ref 8.9–10.3)
Chloride: 102 mEq/L (ref 96–112)
Chloride: 104 mmol/L (ref 98–111)
Creatinine, Ser: 1.22 mg/dL — ABNORMAL HIGH (ref 0.40–1.20)
Creatinine, Ser: 1.41 mg/dL — ABNORMAL HIGH (ref 0.44–1.00)
GFR, Estimated: 43 mL/min — ABNORMAL LOW (ref >60)
GFR: 48.55 mL/min — ABNORMAL LOW (ref >60.00)
Glucose, Bld: 173 mg/dL — ABNORMAL HIGH (ref 70–99)
Glucose, Bld: 160 mg/dL — ABNORMAL HIGH (ref 70–99)
Potassium: 4.3 mEq/L (ref 3.5–5.1)
Potassium: 4.4 mmol/L (ref 3.5–5.1)
Sodium: 139 mEq/L (ref 135–145)
Sodium: 137 mmol/L (ref 135–145)
Total Bilirubin: 0.3 mg/dL (ref 0.2–1.2)
Total Bilirubin: 0.5 mg/dL (ref 0.3–1.2)
Total Protein: 7.7 g/dL (ref 6.0–8.3)
Total Protein: 7.5 g/dL (ref 6.5–8.1)

## 2022-05-31 LAB — TROPONIN I (HIGH SENSITIVITY): Troponin I (High Sensitivity): 2 ng/L (ref <18)

## 2022-05-31 LAB — POC OCCULT BLOOD, ED: Fecal Occult Bld: NEGATIVE

## 2022-05-31 LAB — LACTIC ACID, PLASMA
Lactic Acid, Venous: 3.4 mmol/L (ref 0.5–1.9)
Lactic Acid, Venous: 2.1 mmol/L (ref 0.5–1.9)

## 2022-05-31 LAB — PROTIME-INR
INR: 1.1 (ref 0.8–1.2)
Prothrombin Time: 14.2 seconds (ref 11.4–15.2)

## 2022-05-31 MED ORDER — VANCOMYCIN HCL 2000 MG/400ML IV SOLN
2000.0000 mg | Freq: Once | INTRAVENOUS | Status: AC
  Administered 2022-05-31: 2000 mg via INTRAVENOUS
  Filled 2022-05-31: qty 400

## 2022-05-31 MED ORDER — FENTANYL CITRATE PF 50 MCG/ML IJ SOSY
50.0000 ug | PREFILLED_SYRINGE | Freq: Once | INTRAMUSCULAR | Status: AC
  Administered 2022-05-31: 50 ug via INTRAVENOUS
  Filled 2022-05-31: qty 1

## 2022-05-31 MED ORDER — ONDANSETRON HCL 4 MG/2ML IJ SOLN
4.0000 mg | Freq: Once | INTRAMUSCULAR | Status: AC
  Administered 2022-05-31: 4 mg via INTRAVENOUS
  Filled 2022-05-31: qty 2

## 2022-05-31 MED ORDER — LACTATED RINGERS IV BOLUS
1000.0000 mL | Freq: Once | INTRAVENOUS | Status: AC
  Administered 2022-05-31: 1000 mL via INTRAVENOUS

## 2022-05-31 MED ORDER — SODIUM CHLORIDE 0.9 % IV SOLN
2.0000 g | Freq: Two times a day (BID) | INTRAVENOUS | Status: DC
  Administered 2022-05-31: 2 g via INTRAVENOUS
  Filled 2022-05-31: qty 12.5

## 2022-05-31 MED ORDER — METRONIDAZOLE 500 MG/100ML IV SOLN
500.0000 mg | Freq: Once | INTRAVENOUS | Status: DC
  Filled 2022-05-31: qty 100

## 2022-05-31 MED ORDER — VANCOMYCIN HCL IN DEXTROSE 1-5 GM/200ML-% IV SOLN: 1000.0000 mg | INTRAVENOUS | Status: DC

## 2022-05-31 MED ORDER — SODIUM CHLORIDE 0.9 % IV SOLN: INTRAVENOUS | Status: AC

## 2022-05-31 MED ORDER — ACETAMINOPHEN 650 MG RE SUPP: 650.0000 mg | Freq: Four times a day (QID) | RECTAL | Status: DC | PRN

## 2022-05-31 MED ORDER — ACETAMINOPHEN 325 MG PO TABS: 650.0000 mg | ORAL_TABLET | Freq: Four times a day (QID) | ORAL | Status: DC | PRN

## 2022-05-31 NOTE — ED Triage Notes (Signed)
On arrival patient is pale, clammy and dizzy. Placed in trendelenburg for hypotension. MD informed. EMS was unable to obtain IV access in route.

## 2022-05-31 NOTE — ED Provider Notes (Signed)
[facility] Provider Note   CSN: [id number] Arrival date & time: 05/31/22  1551     History  Chief Complaint  Patient presents with   Loss of Consciousness    Wendy Barber is a 61 y.o. female with a history of appendiceal cancer treated via surgical resection, per husband partial small bowel and colon resection resulting in chronic diarrhea, also has a history of DVT and PE treated with Xarelto, but was admitted last month secondary to acute blood loss, patient reporting developed anemia down to 7.8, resolved without need for transfusion, unclear of source, suspected diverticular.  Currently not on Xarelto as she has an IVC filter in place, reporting she has had persistent weakness, lightheadedness and dizziness since this admission.  She was seen by her PCP yesterday for the symptoms and blood work obtained yesterday Was reassuring as her hemoglobin was 9.1.  However prior to arrival today she had increased weakness and lightheadedness.  She was in the bathroom, had a diarrheal bowel movement, it was not bloody per husband's report, she called to him as she felt worse and when he arrived she was unconscious, on the toilet, he describes shaking limbs suggestive of seizure activity, he states that she shaking stopped, she then snored briefly before starting to respond to him.  EMS was called, her initial blood pressure was 122/81 but in route it dropped to 76/53.  She currently feels very weak and lightheaded, also endorses nausea.  She denies chest pain, shortness of breath, headache, abdominal pain.  The history is provided by the patient.       Home Medications Prior to Admission medications   Medication Sig Start Date End Date Taking? Authorizing Provider  alum & mag hydroxide-simeth (MAALOX/MYLANTA) 200-200-20 MG/5ML suspension Take 15 mLs by mouth every 6 (six) hours as needed for indigestion or heartburn. 05/08/22  Yes Aline August, MD  buPROPion (WELLBUTRIN XL)  150 MG 24 hr tablet Take 1 tablet (150 mg total) by mouth daily. 04/27/22  Yes White, Aaron Edelman A, NP  busPIRone (BUSPAR) 15 MG tablet Take 1 tablet (15 mg total) by mouth 3 (three) times daily. 04/05/22  Yes Lesle Chris A, NP  citalopram (CELEXA) 40 MG tablet Take 1 tablet (40 mg total) by mouth daily. 04/27/22  Yes White, Louanna Raw, NP  dicyclomine (BENTYL) 20 MG tablet Take 1 tablet (20 mg total) by mouth in the morning, at noon, in the evening, and at bedtime. 05/08/22  Yes Aline August, MD  diphenhydrAMINE (SOMINEX) 25 MG tablet Take 25 mg by mouth daily as needed for itching.   Yes [provider]  diphenoxylate-atropine (LOMOTIL) 2.5-0.025 MG tablet TAKE 2 TABLETS IN THE MORNING, AT NOON, IN THE EVENING AND AT BEDTIME Patient taking differently: Take 2 tablets by mouth 4 (four) times daily. 04/22/22  Yes Ennever, Rudell Cobb, MD  eszopiclone (LUNESTA) 2 MG TABS tablet Take 1 tablet (2 mg total) by mouth at bedtime as needed for sleep. Take immediately before bedtime 11/22/21  Yes White, Brian A, NP  famotidine (PEPCID) 20 MG tablet TAKE 2 TABLETS TWICE A DAY Patient taking differently: Take 40 mg by mouth 2 (two) times daily. 03/01/22  Yes Ennever, Rudell Cobb, MD  folic acid (FOLVITE) 1 MG tablet Take 2 tablets (2 mg total) by mouth daily. 05/09/22  Yes Aline August, MD  hydrocortisone (ANUSOL-HC) 2.5 % rectal cream Place rectally 2 (two) times daily. 05/08/22  Yes Aline August, MD  loperamide (IMODIUM) 2 MG capsule Take  2 capsules (4 mg total) by mouth 4 (four) times daily as needed for diarrhea or loose stools. 05/08/22  Yes Aline August, MD  ondansetron (ZOFRAN) 8 MG tablet Take 8 mg by mouth every 8 (eight) hours as needed for nausea. 03/03/21  Yes [provider]  orphenadrine (NORFLEX) 100 MG tablet Take 1 tablet (100 mg total) by mouth at bedtime as needed for muscle spasms. 09/29/21  Yes Volanda Napoleon, MD  pantoprazole (PROTONIX) 40 MG tablet Take 40 mg by mouth every morning. 01/01/21   Yes [provider]  potassium chloride SA (KLOR-CON M) 10 MEQ tablet Take 2 tablets (20 mEq total) by mouth daily. 05/12/22  Yes [name], MD  promethazine (PHENERGAN) 12.5 MG tablet Take 1 tablet (12.5 mg total) by mouth 2 (two) times daily as needed. Patient taking differently: Take 12.5 mg by mouth 2 (two) times daily as needed for nausea. 04/21/22  Yes [name], MD  propranolol (INDERAL) 20 MG tablet Take 1 tablet (20 mg total) by mouth 2 (two) times daily. 04/21/22  Yes [name], MD  traMADol (ULTRAM) 50 MG tablet Take 1 tablet (50 mg total) by mouth every 6 (six) hours as needed for moderate pain. 05/08/22  Yes Aline August, MD  valsartan-hydrochlorothiazide (DIOVAN-HCT) 160-12.5 MG tablet Take 1 tablet by mouth daily. 03/02/22  Yes [provider]  meclizine (ANTIVERT) 25 MG tablet Take 1 tablet (25 mg total) by mouth 3 (three) times daily as needed for dizziness. Patient not taking: Reported on 05/31/2022 05/30/22   [name], MD  XARELTO 20 MG TABS tablet Take 20 mg by mouth daily. Patient not taking: Reported on 05/31/2022 05/13/22   [provider]      Allergies    Penicillins, Alprazolam, Ativan [lorazepam], Corticosteroids, Erythromycin, Prednisone, Savella  [milnacipran], and Prednisolone    Review of Systems   Review of Systems  Constitutional:  Positive for fatigue. Negative for fever.  HENT:  Negative for congestion and sore throat.   Eyes: Negative.   Respiratory:  Negative for chest tightness and shortness of breath.   Cardiovascular:  Negative for chest pain.  Gastrointestinal:  Positive for diarrhea. Negative for abdominal pain, blood in stool and nausea.       Reports chronic diarrhea since abdominal surgeries.  No recent blood in stools noted.  Genitourinary: Negative.   Musculoskeletal:  Negative for arthralgias, joint swelling and neck pain.  Skin: Negative.  Negative for rash and wound.  Neurological:   Positive for syncope and weakness. Negative for dizziness, light-headedness, numbness and headaches.  Psychiatric/Behavioral: Negative.      Physical Exam Updated Vital Signs BP 121/69   Pulse 82   Temp 98 F (36.7 C) (Rectal)   Resp 17   Ht '5\' 6"'$  (1.676 m)   Wt 103.9 kg   SpO2 100%   BMI 36.96 kg/m  Physical Exam Vitals and nursing note reviewed.  Constitutional:      Appearance: She is well-developed. She is ill-appearing.     Comments: Pale, diaphoretic  HENT:     Head: Normocephalic and atraumatic.  Eyes:     Conjunctiva/sclera: Conjunctivae normal.  Cardiovascular:     Rate and Rhythm: Normal rate and regular rhythm.     Heart sounds: Normal heart sounds.  Pulmonary:     Effort: Pulmonary effort is normal.     Breath sounds: Normal breath sounds. No wheezing.  Abdominal:     General: Bowel sounds are normal.  Palpations: Abdomen is soft.     Tenderness: There is no abdominal tenderness. There is no guarding or rebound.  Musculoskeletal:        General: Normal range of motion.     Cervical back: Normal range of motion.  Skin:    General: Skin is warm and dry.  Neurological:     Mental Status: She is alert.     ED Results / Procedures / Treatments   Labs (all labs ordered are listed, but only abnormal results are displayed) Labs Reviewed  COMPREHENSIVE METABOLIC PANEL - Abnormal; Notable for the following components:      Result Value   Glucose, Bld 160 (*)    BUN 24 (*)    Creatinine, Ser 1.41 (*)    Calcium 8.7 (*)    Alkaline Phosphatase 141 (*)    GFR, Estimated 43 (*)    All other components within normal limits  CBC WITH DIFFERENTIAL/PLATELET - Abnormal; Notable for the following components:   WBC 12.1 (*)    RBC 2.96 (*)    Hemoglobin 8.3 (*)    HCT 27.0 (*)    Neutro Abs 8.4 (*)    All other components within normal limits  LACTIC ACID, PLASMA - Abnormal; Notable for the following components:   Lactic Acid, Venous 3.4 (*)    All other  components within normal limits  CULTURE, BLOOD (ROUTINE X 2)  CULTURE, BLOOD (ROUTINE X 2)  PROTIME-INR  LACTIC ACID, PLASMA  BASIC METABOLIC PANEL  POC OCCULT BLOOD, ED  TYPE AND SCREEN  TROPONIN I (HIGH SENSITIVITY)    EKG EKG Interpretation  Date/Time:  Tuesday May 31 2022 16:03:13 EDT Ventricular Rate:  75 PR Interval:  156 QRS Duration: 91 QT Interval:  384 QTC Calculation: 429 R Axis:   23 Text Interpretation: Sinus rhythm Nonspecific repol abnormality, diffuse leads ST depressions more pronounced than prior 01/27/2023 Confirmed by Aletta Edouard [id number]) on 05/31/2022 4:18:34 PM  Radiology No results found.  Procedures Procedures    Medications Ordered in ED Medications  metroNIDAZOLE (FLAGYL) IVPB 500 mg (500 mg Intravenous Not Given 05/31/22 2222)  vancomycin (VANCOREADY) IVPB 2000 mg/400 mL (2,000 mg Intravenous New Bag/Given 05/31/22 2121)  ceFEPIme (MAXIPIME) 2 g in sodium chloride 0.9 % 100 mL IVPB (2 g Intravenous New Bag/Given 05/31/22 2113)  vancomycin (VANCOCIN) IVPB 1000 mg/200 mL premix (has no administration in time range)  fentaNYL (SUBLIMAZE) injection 50 mcg (has no administration in time range)  lactated ringers bolus 1,000 mL (0 mLs Intravenous Stopped 05/31/22 1730)  ondansetron (ZOFRAN) injection 4 mg (4 mg Intravenous Given 05/31/22 1842)  lactated ringers bolus 1,000 mL (0 mLs Intravenous Stopped 05/31/22 1951)  lactated ringers bolus 1,000 mL (1,000 mLs Intravenous New Bag/Given 05/31/22 2122)    ED Course/ Medical Decision Making/ A&P Clinical Course as of 05/31/22 2254  Tue May 31, 6649  3534 61 year old female with recent admission for lower GI bleed here feeling continued weak times weeks.  Syncopal event today.  Patient is pale blood pressure is low.  She is however awake and alert.  Getting IV access fluids monitoring labs.  Disposition per results of testing. [MB]    Clinical Course User Index [MB] Hayden Rasmussen, MD                            Medical Decision Making Patient presenting with extreme hypotension, initially upon presentation blood pressure 52/31 in association with a  syncopal event prior to arrival while sitting in her bathroom on her toilet.  She possibly had a vasovagal event, but had not had a bowel movement until after the syncope and she was actually waking from this event.  Husband also reports noting tremors/shaking in her arms and legs, less likely that this was a seizure, more likely that this was a vagal response/hypotension.  She had a recent hospitalization secondary to GI bleed and significant anemia, not requiring blood transfusion, but states she has not felt well, feeling very weak since that hospitalization.  Denies chest pain, shortness of breath, abdominal pain, no further blood noted from GI tract.  She does endorse a headache since arrival here.  Patient has no obvious source of infection, the differential given hypotension includes sepsis however.  She was given 30 cc/kg bolus of Ringer's, additionally she was given antibiotics for undifferentiated sepsis.  She still feels weak but improved since receiving the IV fluids.    Amount and/or Complexity of Data Reviewed Labs: ordered.    Details: Labs most significant including a lactic acid of 3.4.  She also has a leukocytosis with a WBC count of 12.1.  Her hemoglobin is 8.3 which is reduced from yesterday's outpatient testing by her PCP which was 9.1.  She is Hemoccult negative here.  She also has definite dehydration, she is got a BUN of 24, creatinine of 1.41.  Blood cultures are pending at this time.  Troponin is negative.  Second lactic acid is currently pending. Discussion of management or test interpretation with external provider(s): Pt discussed with Dr. Maudie Mercury who accepts pt for admission.  Risk Prescription drug management. Decision regarding hospitalization.           Final Clinical Impression(s) / ED Diagnoses Final diagnoses:   Hypotension, unspecified hypotension type  Syncope, unspecified syncope type    Rx / DC Orders ED Discharge Orders     None         Landis Martins 05/31/22 2254    Hayden Rasmussen, MD 06/01/22 [phone]

## 2022-05-31 NOTE — H&P (Signed)
History and Physical    Patient: Wendy Barber DZH:[id number] DOB: May 15, 1961 DOA: 05/31/2022 DOS: the patient was seen and examined on 05/31/2022 PCP: [name], MD  Burney Gauze - oncologist Dr. Tomi Likens - neurologist  Patient coming from: home  Chief Complaint:  Chief Complaint  Patient presents with   Loss of Consciousness   HPI: Wendy Barber is a 61 y.o. female with medical history significant of fibromyalgia, hypertension, Dm2, DVT/ PE w IVC, h/o pseudomyxoma peritonei, s/p HIPEC x2 (2004, 2018), posterior meningioma, w hx of recent admission for rectal bleeding/ anemia, and hx of dizziness / vertigo presents with c/o "passing out" this afternoon.  Pt was in the bathroom on the toilet when she felt lightheaded and weak.  Pt called out for husband and he found her unresponsive and shaking her arms and legs.  Pt denies biting her tongue and does not recall any incontinence.  Pt denies dysuria, preceding headache, vision change, focal weakness, cp, palp, sob, n/v.  Pt has chronic diarrhea 6x per day, but denies any increase in diarrhea or any brbpr, since prior admission at [facility]. Pt was scheduled for outpatient MRI brain  (open) because she is claustrophobic, but has not had this yet.     Review of Systems: negative for all 10 organ systems except for + above  FOBT negative today.   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[place]) 03/19/2020   Chronic fatigue syndrome    Diabetes mellitus without complication (HCC)    DVT of deep femoral vein, left ([place]) 03/19/2020   Fibromyalgia    Goals of care, counseling/discussion 03/19/2020   Hypertension    Iron deficiency anemia due to chronic blood loss 05/14/2021   Malignant pseudomyxoma peritonei ([place]) 03/19/2020   Pernicious anemia 05/14/2021   Presence of IVC filter 03/19/2020   Pulmonary embolism,  bilateral ([place]) 03/19/2020   Past Surgical History:  Procedure Laterality Date   ABDOMINAL HYSTERECTOMY     ABDOMINAL SURGERY     ACHILLES TENDON REPAIR     APPENDECTOMY     ARTHROPLASTY     CARPAL TUNNEL RELEASE     CHOLECYSTECTOMY     IR CATHETER TUBE CHANGE  02/02/2021   IR CATHETER TUBE CHANGE  02/25/2021   IR RADIOLOGIST EVAL & MGMT  02/24/2021   IR RADIOLOGIST EVAL & MGMT  03/10/2021   IR US GUIDE BX ASP/DRAIN  11/25/2019   JOINT REPLACEMENT     KNEE ARTHROSCOPY     ORIF ANKLE FRACTURE Right 08/27/2019   Procedure: OPEN REDUCTION INTERNAL FIXATION RIGHT ANKLE FRACTURE;  Surgeon: Renette Butters, MD;  Location: [facility] ORS;  Service: Orthopedics;  Laterality: Right;   perineorrophy     TONGUE BIOPSY     Social History:  reports that she has never smoked. She has never used smokeless tobacco. She reports that she does not drink alcohol and does not use drugs.  Family History  Problem Relation Age of Onset   Depression Mother    Drug abuse Sister    Suicidality Sister    Alcohol abuse Brother    Drug abuse Brother     Allergies  Allergen Reactions   Penicillins Shortness Of Breath    Other reaction(s): Irregular Heart Rate, Other (See Comments) Rapid heartrate    Alprazolam Hives and Other (See Comments)  Hard to arouse, unresponsiveness   Ativan [Lorazepam] Other (See Comments)    Note: tolerates midazolam fine Face & Throat Swelling.   Corticosteroids Other (See Comments)    Other reaction(s): Other (see comments) Psychotic behaviour     Erythromycin     Other reaction(s): Other (See Comments) Severe stomach pain    Prednisone Other (See Comments)    Anxiety & Nervous Breakdown.   Savella  [Milnacipran]    Prednisolone Anxiety   Prior to Admission medications   Medication Sig Start Date End Date Taking? Authorizing Provider  alum & mag hydroxide-simeth (MAALOX/MYLANTA) 200-200-20 MG/5ML suspension Take 15 mLs by mouth every 6 (six) hours as needed for  indigestion or heartburn. 05/08/22  Yes Aline August, MD  buPROPion (WELLBUTRIN XL) 150 MG 24 hr tablet Take 1 tablet (150 mg total) by mouth daily. 04/27/22  Yes White, Aaron Edelman A, NP  busPIRone (BUSPAR) 15 MG tablet Take 1 tablet (15 mg total) by mouth 3 (three) times daily. 04/05/22  Yes Lesle Chris A, NP  citalopram (CELEXA) 40 MG tablet Take 1 tablet (40 mg total) by mouth daily. 04/27/22  Yes White, Louanna Raw, NP  dicyclomine (BENTYL) 20 MG tablet Take 1 tablet (20 mg total) by mouth in the morning, at noon, in the evening, and at bedtime. 05/08/22  Yes Aline August, MD  diphenhydrAMINE (SOMINEX) 25 MG tablet Take 25 mg by mouth daily as needed for itching.   Yes [provider]  diphenoxylate-atropine (LOMOTIL) 2.5-0.025 MG tablet TAKE 2 TABLETS IN THE MORNING, AT NOON, IN THE EVENING AND AT BEDTIME Patient taking differently: Take 2 tablets by mouth 4 (four) times daily. 04/22/22  Yes Ennever, Rudell Cobb, MD  eszopiclone (LUNESTA) 2 MG TABS tablet Take 1 tablet (2 mg total) by mouth at bedtime as needed for sleep. Take immediately before bedtime 11/22/21  Yes White, Brian A, NP  famotidine (PEPCID) 20 MG tablet TAKE 2 TABLETS TWICE A DAY Patient taking differently: Take 40 mg by mouth 2 (two) times daily. 03/01/22  Yes Ennever, Rudell Cobb, MD  folic acid (FOLVITE) 1 MG tablet Take 2 tablets (2 mg total) by mouth daily. 05/09/22  Yes Aline August, MD  hydrocortisone (ANUSOL-HC) 2.5 % rectal cream Place rectally 2 (two) times daily. 05/08/22  Yes Aline August, MD  loperamide (IMODIUM) 2 MG capsule Take 2 capsules (4 mg total) by mouth 4 (four) times daily as needed for diarrhea or loose stools. 05/08/22  Yes Aline August, MD  ondansetron (ZOFRAN) 8 MG tablet Take 8 mg by mouth every 8 (eight) hours as needed for nausea. 03/03/21  Yes [provider]  orphenadrine (NORFLEX) 100 MG tablet Take 1 tablet (100 mg total) by mouth at bedtime as needed for muscle spasms. 09/29/21  Yes Volanda Napoleon, MD  pantoprazole (PROTONIX) 40 MG tablet Take 40 mg by mouth every morning. 01/01/21  Yes [provider]  potassium chloride SA (KLOR-CON M) 10 MEQ tablet Take 2 tablets (20 mEq total) by mouth daily. 05/12/22  Yes [name], MD  promethazine (PHENERGAN) 12.5 MG tablet Take 1 tablet (12.5 mg total) by mouth 2 (two) times daily as needed. Patient taking differently: Take 12.5 mg by mouth 2 (two) times daily as needed for nausea. 04/21/22  Yes [name], MD  propranolol (INDERAL) 20 MG tablet Take 1 tablet (20 mg total) by mouth 2 (two) times daily. 04/21/22  Yes [name], MD  traMADol (ULTRAM) 50 MG tablet Take 1  tablet (50 mg total) by mouth every 6 (six) hours as needed for moderate pain. 05/08/22  Yes Aline August, MD  valsartan-hydrochlorothiazide (DIOVAN-HCT) 160-12.5 MG tablet Take 1 tablet by mouth daily. 03/02/22  Yes [provider]  meclizine (ANTIVERT) 25 MG tablet Take 1 tablet (25 mg total) by mouth 3 (three) times daily as needed for dizziness. Patient not taking: Reported on 05/31/2022 05/30/22   [name], MD  XARELTO 20 MG TABS tablet Take 20 mg by mouth daily. Patient not taking: Reported on 05/31/2022 05/13/22   [provider]    Physical Exam: Vitals:   05/31/22 1945 05/31/22 2000 05/31/22 2130 05/31/22 2200  BP:  107/72 113/66 121/69  Pulse:   78 82  Resp: '13 14 14 17  '$ Temp:      TempSrc:      SpO2:  100% 100% 100%  Weight:      Height:       Heent: anicteric, pupils 1.55m symmetric, direct, consensual, near intact, tongue midline Neck: no jvd, no bruit, no tm Heart: rrr s1, s2, no m/g/r Lung: ctab Abd: soft, obese, nt, nd, +bs Ext: no c/c/e Neuro: cn2-12 intact, reflexes 2+ symmetric, diffuse with downgoing toes bilaterally, motor 5/5 in all 4 ext Skin: no rash Lymph: no noticeable adenopathy  Data Reviewed: Ekg: nsr at 75, nl axis, q in 3, avf, slight st depression in v3-6  Assessment and  Plan:  Syncope vs Seizure Check CT brain Check carotid ultrasound, check cardiac echo Check orthostatic bp Tele, cycle cardiac markers Neuro check EEG Neurology consult  Hypotension Resolved with ns iv Cycle cardiac markers Check cortisol Check cardiac echo as above  Anemia Check iron, tibc, ferritin, b12, folate, cbc in am Cont Folic acid '1mg'$  po qday  Hx of PE/ DVT S/p IVC Off Xarelto due to recent GI bleeding  Mild ARF Hydrate gently with ns iv at 775mper hour x 10 hr Hold Diovan/ hydrochlorothiazide Check cmp in am  Elevated lactic acid Pt received iv abx in ED Pt doesn't appear to be septic, will hold off on further abx  Hypertension Hold Diovan/ hydrochlorothiazide Cont propranolol '20mg'$  po bid  Gerd Cont Protonix '40mg'$  po qday Cont Pepcid 40-> '20mg'$  po bid  Nausea Cont Zofran '8mg'$  po tid prn   Chronic diarrhea Cont Lomotil 2 po qid Cont Immodium '2mg'$  prn  Cont KCL 20 meq po qday  Muscle Spasm Cont Norflex '100mg'$  po qhs prn   Dm2 Check hga1c Fsbs ac and qhs, ISS  Anxiety Cont Celexa '40mg'$  po qday Cont Wellbutrin XR '150mg'$  po qday Hold Buspar '15mg'$  po tid (redundant)  Insomnia Lunesta -> Ambien '5mg'$  po qhs prn   Hemorrhoids Cont Annusol  Vertigo Meclizine prn    Advance Care Planning: FULL CODE  Consults: neurology  Family Communication:  w daughter and husband  Severity of Illness: Pt recommended for observation due to hypotension and syncope vs seizure, pt will likely need less than 2 days , unless not improving.   DVT prophylaxis: Lovenox   Author: JaJani GravelMD 05/31/2022 11:02 PM  For on call review [email or web address]

## 2022-05-31 NOTE — ED Triage Notes (Signed)
"  Patient has not been feeling well, weakness, has chronic diarrhea from cancer surgery, saw primary MD yesterday, was told if not better to come to hospital. Today went to the bathroom to have BM, yelled for her husband and when he got there she was unresponsive with jerking of limbs suggestive of seizure activity and then snoring per her husband. On our arrival BP was 122/ 81, it decreased during the ride and dropped to 76/53 by arrival here" per EMS

## 2022-05-31 NOTE — H&P (Incomplete)
History and Physical    Patient: Wendy Barber HQI:[id number] DOB: 1961-07-09 DOA: 05/31/2022 DOS: the patient was seen and examined on 05/31/2022 PCP: [name], MD  Burney Gauze - oncologist Dr. Tomi Likens - neurologist  Patient coming from: home  Chief Complaint:  Chief Complaint  Patient presents with  . Loss of Consciousness   HPI: Wendy Barber is a 61 y.o. female with medical history significant of fibromyalgia, hypertension, Dm2, DVT/ PE w IVC, h/o pseudomyxoma peritonei, s/p HIPEC x2 (2004, 2018), posterior meningioma, w hx of recent admission for rectal bleeding/ anemia, and hx of dizziness / vertigo presents with c/o "passing out" this afternoon.  Pt was in the bathroom on the toilet when she felt lightheaded and weak.  Pt called out for husband and he found her unresponsive and shaking her arms and legs.  Pt denies biting her tongue and does not recall any incontinence.  Pt denies dysuria, preceding headache, vision change, focal weakness, cp, palp, sob, n/v.  Pt has chronic diarrhea 6x per day, but denies any increase in diarrhea or any brbpr, since prior admission at [facility]. Pt was scheduled for outpatient MRI brain  (open) because she is claustrophobic, but has not had this yet.     Review of Systems: negative for all 10 organ systems except for + above  FOBT negative today.   Past Medical History:  Diagnosis Date  . Arthritis   . Back pain   . Cancer [facility])    pseudomyxoma peritonei  . 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 Chronic fatigue syndrome   . Diabetes mellitus without complication ([place])   . DVT of deep femoral vein, left ([place]) 03/19/2020  . Fibromyalgia   . Goals of care, counseling/discussion 03/19/2020  . Hypertension   . Iron deficiency anemia due to chronic blood loss 05/14/2021  . Malignant pseudomyxoma peritonei ([place]) 03/19/2020  . Pernicious anemia 05/14/2021  . Presence of IVC filter 03/19/2020  .  Pulmonary embolism, bilateral ([place]) 03/19/2020   Past Surgical History:  Procedure Laterality Date  . ABDOMINAL HYSTERECTOMY    . ABDOMINAL SURGERY    . ACHILLES TENDON REPAIR    . APPENDECTOMY    . ARTHROPLASTY    . CARPAL TUNNEL RELEASE    . CHOLECYSTECTOMY    . IR CATHETER TUBE CHANGE  02/02/2021  . IR CATHETER TUBE CHANGE  02/25/2021  . IR RADIOLOGIST EVAL & MGMT  02/24/2021  . IR RADIOLOGIST EVAL & MGMT  03/10/2021  . IR US GUIDE BX ASP/DRAIN  11/25/2019  . JOINT REPLACEMENT    . KNEE ARTHROSCOPY    . ORIF ANKLE FRACTURE Right 08/27/2019   Procedure: OPEN REDUCTION INTERNAL FIXATION RIGHT ANKLE FRACTURE;  Surgeon: Renette Butters, MD;  Location: [facility] OR[facility];  Service: Orthopedics;  Laterality: Right;  . perineorrophy    . TONGUE BIOPSY     Social History:  reports that she has never smoked. She has never used smokeless tobacco. She reports that she does not drink alcohol and does not use drugs.  Family History  Problem Relation Age of Onset  . Depression Mother   . Drug abuse Sister   . Suicidality Sister   . Alcohol abuse Brother   . Drug abuse Brother     Allergies  Allergen Reactions  . Penicillins Shortness Of Breath    Other reaction(s): Irregular Heart Rate, Other (See Comments) Rapid heartrate   . Alprazolam Hives and Other (See Comments)  Hard to arouse, unresponsiveness  . Ativan [Lorazepam] Other (See Comments)    Note: tolerates midazolam fine Face & Throat Swelling.  . Corticosteroids Other (See Comments)    Other reaction(s): Other (see comments) Psychotic behaviour    . Erythromycin     Other reaction(s): Other (See Comments) Severe stomach pain   . Prednisone Other (See Comments)    Anxiety & Nervous Breakdown.  Ocie Cornfield  [Milnacipran]   . Prednisolone Anxiety   Prior to Admission medications   Medication Sig Start Date End Date Taking? Authorizing Provider  alum & mag hydroxide-simeth (MAALOX/MYLANTA) 200-200-20 MG/5ML suspension Take 15 mLs by  mouth every 6 (six) hours as needed for indigestion or heartburn. 05/08/22  Yes Aline August, MD  buPROPion (WELLBUTRIN XL) 150 MG 24 hr tablet Take 1 tablet (150 mg total) by mouth daily. 04/27/22  Yes White, Aaron Edelman A, NP  busPIRone (BUSPAR) 15 MG tablet Take 1 tablet (15 mg total) by mouth 3 (three) times daily. 04/05/22  Yes Lesle Chris A, NP  citalopram (CELEXA) 40 MG tablet Take 1 tablet (40 mg total) by mouth daily. 04/27/22  Yes White, Louanna Raw, NP  dicyclomine (BENTYL) 20 MG tablet Take 1 tablet (20 mg total) by mouth in the morning, at noon, in the evening, and at bedtime. 05/08/22  Yes Aline August, MD  diphenhydrAMINE (SOMINEX) 25 MG tablet Take 25 mg by mouth daily as needed for itching.   Yes [provider]  diphenoxylate-atropine (LOMOTIL) 2.5-0.025 MG tablet TAKE 2 TABLETS IN THE MORNING, AT NOON, IN THE EVENING AND AT BEDTIME Patient taking differently: Take 2 tablets by mouth 4 (four) times daily. 04/22/22  Yes Ennever, Rudell Cobb, MD  eszopiclone (LUNESTA) 2 MG TABS tablet Take 1 tablet (2 mg total) by mouth at bedtime as needed for sleep. Take immediately before bedtime 11/22/21  Yes White, Brian A, NP  famotidine (PEPCID) 20 MG tablet TAKE 2 TABLETS TWICE A DAY Patient taking differently: Take 40 mg by mouth 2 (two) times daily. 03/01/22  Yes Ennever, Rudell Cobb, MD  folic acid (FOLVITE) 1 MG tablet Take 2 tablets (2 mg total) by mouth daily. 05/09/22  Yes Aline August, MD  hydrocortisone (ANUSOL-HC) 2.5 % rectal cream Place rectally 2 (two) times daily. 05/08/22  Yes Aline August, MD  loperamide (IMODIUM) 2 MG capsule Take 2 capsules (4 mg total) by mouth 4 (four) times daily as needed for diarrhea or loose stools. 05/08/22  Yes Aline August, MD  ondansetron (ZOFRAN) 8 MG tablet Take 8 mg by mouth every 8 (eight) hours as needed for nausea. 03/03/21  Yes [provider]  orphenadrine (NORFLEX) 100 MG tablet Take 1 tablet (100 mg total) by mouth at bedtime as needed for  muscle spasms. 09/29/21  Yes Volanda Napoleon, MD  pantoprazole (PROTONIX) 40 MG tablet Take 40 mg by mouth every morning. 01/01/21  Yes [provider]  potassium chloride SA (KLOR-CON M) 10 MEQ tablet Take 2 tablets (20 mEq total) by mouth daily. 05/12/22  Yes [name], MD  promethazine (PHENERGAN) 12.5 MG tablet Take 1 tablet (12.5 mg total) by mouth 2 (two) times daily as needed. Patient taking differently: Take 12.5 mg by mouth 2 (two) times daily as needed for nausea. 04/21/22  Yes [name], MD  propranolol (INDERAL) 20 MG tablet Take 1 tablet (20 mg total) by mouth 2 (two) times daily. 04/21/22  Yes [name], MD  traMADol (ULTRAM) 50 MG tablet Take 1  tablet (50 mg total) by mouth every 6 (six) hours as needed for moderate pain. 05/08/22  Yes Aline August, MD  valsartan-hydrochlorothiazide (DIOVAN-HCT) 160-12.5 MG tablet Take 1 tablet by mouth daily. 03/02/22  Yes [provider]  meclizine (ANTIVERT) 25 MG tablet Take 1 tablet (25 mg total) by mouth 3 (three) times daily as needed for dizziness. Patient not taking: Reported on 05/31/2022 05/30/22   [name], MD  XARELTO 20 MG TABS tablet Take 20 mg by mouth daily. Patient not taking: Reported on 05/31/2022 05/13/22   [provider]    Physical Exam: Vitals:   05/31/22 1945 05/31/22 2000 05/31/22 2130 05/31/22 2200  BP:  107/72 113/66 121/69  Pulse:   78 82  Resp: '13 14 14 17  '$ Temp:      TempSrc:      SpO2:  100% 100% 100%  Weight:      Height:       Heent: anicteric, pupils 1.9m symmetric, direct, consensual, near intact, tongue midline Neck: no jvd, no bruit, no tm Heart: rrr s1, s2, no m/g/r Lung: ctab Abd: soft, obese, nt, nd, +bs Ext: no c/c/e Neuro: cn2-12 intact, reflexes 2+ symmetric, diffuse with downgoing toes bilaterally, motor 5/5 in all 4 ext Skin: no rash Lymph: no noticeable adenopathy  Data Reviewed: Ekg: nsr at 75, nl axis, q in 3, avf, slight st  depression in v3-6  Assessment and Plan:  Syncope vs      Advance Care Planning:   Code Status: Prior ***  Consults: ***  Family Communication: ***  Severity of Illness: {Observation/Inpatient:21159}  Author: JJani Gravel MD 05/31/2022 11:02 PM  For on call review [email or web address]

## 2022-05-31 NOTE — ED Notes (Signed)
X-ray at bedside

## 2022-05-31 NOTE — Progress Notes (Signed)
Pharmacy Antibiotic Note  Wendy Barber is a 61 y.o. female admitted on 05/31/2022 with sepsis. Pharmacy has been consulted for vancomyin and aztreonam dosing. Pt has hx of cephalosporin tolerance (cefepime and cefdinir 2022) so will switch aztreonam to cefepime per protocol. Cr up from BL. Pt hypotensive with elevated lactic acid.  Plan: Cefepime 2g IV q12h Vancomycin '2000mg'$  x1 then '1000mg'$  IV q24h - est AUC 509 Follow Cr, LOT, Cx Vancomycin levels as needed  Height: '5\' 6"'$  (167.6 cm) Weight: 103.9 kg (229 lb) IBW/kg (Calculated) : 59.3  Temp (24hrs), Avg:97.9 F (36.6 C), Min:97.8 F (36.6 C), Max:98 F (36.7 C)  Recent Labs  Lab 05/30/22 1257 05/31/22 1945  WBC 7.8 12.1*  CREATININE 1.22*  --   LATICACIDVEN  --  3.4*    Estimated Creatinine Clearance: 60.4 mL/min (A) (by C-G formula based on SCr of 1.22 mg/dL (H)).    Allergies  Allergen Reactions   Penicillins Shortness Of Breath    Other reaction(s): Irregular Heart Rate, Other (See Comments) Rapid heartrate    Alprazolam Hives and Other (See Comments)    Hard to arouse, unresponsiveness   Ativan [Lorazepam] Other (See Comments)    Note: tolerates midazolam fine Face & Throat Swelling.   Corticosteroids Other (See Comments)    Other reaction(s): Other (see comments) Psychotic behaviour     Erythromycin     Other reaction(s): Other (See Comments) Severe stomach pain    Prednisone Other (See Comments)    Anxiety & Nervous Breakdown.   Savella  [Milnacipran]    Prednisolone Anxiety    Antimicrobials this admission: Vancomycin 8/8 >>  Cefepime 8/8 >>    Microbiology results: pending  Thank you for allowing pharmacy to be a part of this patient's care.  Einar Grad 05/31/2022 8:27 PM

## 2022-06-01 ENCOUNTER — Observation Stay (HOSPITAL_COMMUNITY): Payer: Medicare Other

## 2022-06-01 ENCOUNTER — Inpatient Hospital Stay (HOSPITAL_COMMUNITY)
Admit: 2022-06-01 | Discharge: 2022-06-01 | Disposition: A | Discharge status: Home / Self Care | Payer: Medicare Other | Attending: Internal Medicine | Admitting: Internal Medicine

## 2022-06-01 ENCOUNTER — Other Ambulatory Visit (HOSPITAL_COMMUNITY): Payer: Self-pay | Admitting: *Deleted

## 2022-06-01 ENCOUNTER — Encounter: Payer: Self-pay | Admitting: Family Medicine

## 2022-06-01 DIAGNOSIS — Z811 Family history of alcohol abuse and dependence: Secondary | ICD-10-CM | POA: Diagnosis not present

## 2022-06-01 DIAGNOSIS — R55 Syncope and collapse: Secondary | ICD-10-CM | POA: Diagnosis present

## 2022-06-01 DIAGNOSIS — Z813 Family history of other psychoactive substance abuse and dependence: Secondary | ICD-10-CM | POA: Diagnosis not present

## 2022-06-01 DIAGNOSIS — I959 Hypotension, unspecified: Secondary | ICD-10-CM | POA: Diagnosis present

## 2022-06-01 DIAGNOSIS — Z86711 Personal history of pulmonary embolism: Secondary | ICD-10-CM | POA: Diagnosis not present

## 2022-06-01 DIAGNOSIS — C772 Secondary and unspecified malignant neoplasm of intra-abdominal lymph nodes: Secondary | ICD-10-CM | POA: Diagnosis present

## 2022-06-01 DIAGNOSIS — Z6838 Body mass index (BMI) 38.0-38.9, adult: Secondary | ICD-10-CM | POA: Diagnosis not present

## 2022-06-01 DIAGNOSIS — Z888 Allergy status to other drugs, medicaments and biological substances status: Secondary | ICD-10-CM | POA: Diagnosis not present

## 2022-06-01 DIAGNOSIS — I10 Essential (primary) hypertension: Secondary | ICD-10-CM | POA: Diagnosis present

## 2022-06-01 DIAGNOSIS — D500 Iron deficiency anemia secondary to blood loss (chronic): Secondary | ICD-10-CM | POA: Diagnosis present

## 2022-06-01 DIAGNOSIS — C786 Secondary malignant neoplasm of retroperitoneum and peritoneum: Secondary | ICD-10-CM | POA: Diagnosis present

## 2022-06-01 DIAGNOSIS — M1990 Unspecified osteoarthritis, unspecified site: Secondary | ICD-10-CM | POA: Diagnosis present

## 2022-06-01 DIAGNOSIS — Z9660 Presence of unspecified orthopedic joint implant: Secondary | ICD-10-CM | POA: Diagnosis present

## 2022-06-01 DIAGNOSIS — M797 Fibromyalgia: Secondary | ICD-10-CM | POA: Diagnosis present

## 2022-06-01 DIAGNOSIS — Z85038 Personal history of other malignant neoplasm of large intestine: Secondary | ICD-10-CM | POA: Diagnosis not present

## 2022-06-01 DIAGNOSIS — E669 Obesity, unspecified: Secondary | ICD-10-CM | POA: Diagnosis present

## 2022-06-01 DIAGNOSIS — E119 Type 2 diabetes mellitus without complications: Secondary | ICD-10-CM | POA: Diagnosis present

## 2022-06-01 DIAGNOSIS — N179 Acute kidney failure, unspecified: Secondary | ICD-10-CM | POA: Diagnosis present

## 2022-06-01 DIAGNOSIS — Z881 Allergy status to other antibiotic agents status: Secondary | ICD-10-CM | POA: Diagnosis not present

## 2022-06-01 DIAGNOSIS — E860 Dehydration: Secondary | ICD-10-CM | POA: Diagnosis present

## 2022-06-01 DIAGNOSIS — Z818 Family history of other mental and behavioral disorders: Secondary | ICD-10-CM | POA: Diagnosis not present

## 2022-06-01 DIAGNOSIS — Z79899 Other long term (current) drug therapy: Secondary | ICD-10-CM | POA: Diagnosis not present

## 2022-06-01 DIAGNOSIS — Z880 Allergy status to penicillin: Secondary | ICD-10-CM | POA: Diagnosis not present

## 2022-06-01 DIAGNOSIS — I6522 Occlusion and stenosis of left carotid artery: Secondary | ICD-10-CM | POA: Diagnosis present

## 2022-06-01 DIAGNOSIS — Z86011 Personal history of benign neoplasm of the brain: Secondary | ICD-10-CM | POA: Diagnosis not present

## 2022-06-01 LAB — URINALYSIS, ROUTINE W REFLEX MICROSCOPIC
Bilirubin Urine: NEGATIVE
Glucose, UA: NEGATIVE mg/dL
Hgb urine dipstick: NEGATIVE
Ketones, ur: NEGATIVE mg/dL
Nitrite: NEGATIVE
Protein, ur: 30 mg/dL — AB
Specific Gravity, Urine: 1.026 (ref 1.005–1.030)
pH: 5 (ref 5.0–8.0)

## 2022-06-01 LAB — COMPREHENSIVE METABOLIC PANEL
ALT: 16 U/L (ref 0–44)
AST: 19 U/L (ref 15–41)
Albumin: 3.4 g/dL — ABNORMAL LOW (ref 3.5–5.0)
Alkaline Phosphatase: 140 U/L — ABNORMAL HIGH (ref 38–126)
Anion gap: 9 (ref 5–15)
BUN: 27 mg/dL — ABNORMAL HIGH (ref 6–20)
CO2: 21 mmol/L — ABNORMAL LOW (ref 22–32)
Calcium: 8.6 mg/dL — ABNORMAL LOW (ref 8.9–10.3)
Chloride: 106 mmol/L (ref 98–111)
Creatinine, Ser: 1.32 mg/dL — ABNORMAL HIGH (ref 0.44–1.00)
GFR, Estimated: 47 mL/min — ABNORMAL LOW (ref >60)
Glucose, Bld: 147 mg/dL — ABNORMAL HIGH (ref 70–99)
Potassium: 4.2 mmol/L (ref 3.5–5.1)
Sodium: 136 mmol/L (ref 135–145)
Total Bilirubin: 0.7 mg/dL (ref 0.3–1.2)
Total Protein: 7.4 g/dL (ref 6.5–8.1)

## 2022-06-01 LAB — CBC
HCT: 24.7 % — ABNORMAL LOW (ref 36.0–46.0)
Hemoglobin: 7.7 g/dL — ABNORMAL LOW (ref 12.0–15.0)
MCH: 28.1 pg (ref 26.0–34.0)
MCHC: 31.2 g/dL (ref 30.0–36.0)
MCV: 90.1 fL (ref 80.0–100.0)
Platelets: 181 10*3/uL (ref 150–400)
RBC: 2.74 MIL/uL — ABNORMAL LOW (ref 3.87–5.11)
RDW: 15.9 % — ABNORMAL HIGH (ref 11.5–15.5)
WBC: 11.5 10*3/uL — ABNORMAL HIGH (ref 4.0–10.5)
nRBC: 0.2 % (ref 0.0–0.2)

## 2022-06-01 LAB — IRON AND TIBC
Iron: 22 ug/dL — ABNORMAL LOW (ref 28–170)
Saturation Ratios: 4 % — ABNORMAL LOW (ref 10.4–31.8)
TIBC: 533 ug/dL — ABNORMAL HIGH (ref 250–450)
UIBC: 511 ug/dL

## 2022-06-01 LAB — ECHOCARDIOGRAM COMPLETE
Area-P 1/2: 4.49 cm2
Height: 66 in
S' Lateral: 2.7 cm
Weight: 3816.6 oz

## 2022-06-01 LAB — TROPONIN I (HIGH SENSITIVITY)
Troponin I (High Sensitivity): 3 ng/L (ref <18)
Troponin I (High Sensitivity): 2 ng/L (ref <18)

## 2022-06-01 LAB — FOLATE: Folate: >40 ng/mL (ref >5.9)

## 2022-06-01 LAB — HEMOGLOBIN A1C
Hgb A1c MFr Bld: 6.5 % — ABNORMAL HIGH (ref 4.8–5.6)
Mean Plasma Glucose: 139.85 mg/dL

## 2022-06-01 LAB — VITAMIN B12: Vitamin B-12: 209 pg/mL (ref 180–914)

## 2022-06-01 LAB — CORTISOL: Cortisol, Plasma: 20.4 ug/dL

## 2022-06-01 LAB — FERRITIN: Ferritin: 10 ng/mL — ABNORMAL LOW (ref 11–307)

## 2022-06-01 MED ORDER — BUPROPION HCL ER (XL) 150 MG PO TB24
150.0000 mg | ORAL_TABLET | Freq: Every day | ORAL | Status: DC
  Administered 2022-06-01 – 2022-06-04 (×4): 150 mg via ORAL
  Filled 2022-06-01 (×4): qty 1

## 2022-06-01 MED ORDER — SODIUM CHLORIDE 0.9 % IV SOLN: INTRAVENOUS | Status: AC

## 2022-06-01 MED ORDER — FOLIC ACID 1 MG PO TABS
2.0000 mg | ORAL_TABLET | Freq: Every day | ORAL | Status: DC
  Administered 2022-06-02 – 2022-06-04 (×3): 2 mg via ORAL
  Filled 2022-06-01 (×4): qty 2

## 2022-06-01 MED ORDER — ONDANSETRON HCL 4 MG/2ML IJ SOLN
4.0000 mg | Freq: Four times a day (QID) | INTRAMUSCULAR | Status: DC | PRN
  Administered 2022-06-01 – 2022-06-04 (×7): 4 mg via INTRAVENOUS
  Filled 2022-06-01 (×7): qty 2

## 2022-06-01 MED ORDER — DIPHENOXYLATE-ATROPINE 2.5-0.025 MG PO TABS
2.0000 | ORAL_TABLET | Freq: Four times a day (QID) | ORAL | Status: DC
  Administered 2022-06-01 – 2022-06-04 (×11): 2 via ORAL
  Filled 2022-06-01 (×12): qty 2

## 2022-06-01 MED ORDER — HYDROMORPHONE HCL 1 MG/ML IJ SOLN
0.5000 mg | INTRAMUSCULAR | Status: DC | PRN
  Administered 2022-06-01 – 2022-06-03 (×10): 0.5 mg via INTRAVENOUS
  Filled 2022-06-01 (×10): qty 0.5

## 2022-06-01 MED ORDER — LOPERAMIDE HCL 2 MG PO CAPS: 4.0000 mg | ORAL_CAPSULE | Freq: Four times a day (QID) | ORAL | Status: DC | PRN

## 2022-06-01 MED ORDER — TRAMADOL HCL 50 MG PO TABS
50.0000 mg | ORAL_TABLET | Freq: Four times a day (QID) | ORAL | Status: DC | PRN
  Administered 2022-06-01: 50 mg via ORAL
  Filled 2022-06-01: qty 1

## 2022-06-01 MED ORDER — ORPHENADRINE CITRATE ER 100 MG PO TB12: 100.0000 mg | ORAL_TABLET | Freq: Every evening | ORAL | Status: DC | PRN

## 2022-06-01 MED ORDER — SODIUM CHLORIDE 0.9 % IV BOLUS
1000.0000 mL | Freq: Once | INTRAVENOUS | Status: AC
Start: 2022-06-01 — End: 2022-06-01
  Administered 2022-06-01: 1000 mL via INTRAVENOUS

## 2022-06-01 MED ORDER — BUSPIRONE HCL 5 MG PO TABS
15.0000 mg | ORAL_TABLET | Freq: Three times a day (TID) | ORAL | Status: DC
  Administered 2022-06-01 – 2022-06-04 (×10): 15 mg via ORAL
  Filled 2022-06-01 (×10): qty 3

## 2022-06-01 MED ORDER — ONDANSETRON HCL 4 MG PO TABS
8.0000 mg | ORAL_TABLET | Freq: Three times a day (TID) | ORAL | Status: DC | PRN
Start: 2022-06-01 — End: 2022-06-01
  Administered 2022-06-01: 8 mg via ORAL
  Filled 2022-06-01: qty 2

## 2022-06-01 MED ORDER — PROPRANOLOL HCL 20 MG PO TABS
20.0000 mg | ORAL_TABLET | Freq: Two times a day (BID) | ORAL | Status: DC
  Administered 2022-06-02 – 2022-06-04 (×4): 20 mg via ORAL
  Filled 2022-06-01 (×2): qty 1
  Filled 2022-06-01 (×2): qty 2
  Filled 2022-06-01 (×4): qty 1

## 2022-06-01 MED ORDER — MORPHINE SULFATE (PF) 2 MG/ML IV SOLN
0.5000 mg | Freq: Once | INTRAVENOUS | Status: AC
  Administered 2022-06-01: 0.5 mg via INTRAVENOUS
  Filled 2022-06-01: qty 1

## 2022-06-01 MED ORDER — DICYCLOMINE HCL 20 MG PO TABS
20.0000 mg | ORAL_TABLET | Freq: Three times a day (TID) | ORAL | Status: DC
  Filled 2022-06-01 (×9): qty 1

## 2022-06-01 MED ORDER — ENOXAPARIN SODIUM 40 MG/0.4ML IJ SOSY
40.0000 mg | PREFILLED_SYRINGE | INTRAMUSCULAR | Status: DC
  Administered 2022-06-01 – 2022-06-04 (×4): 40 mg via SUBCUTANEOUS
  Filled 2022-06-01 (×4): qty 0.4

## 2022-06-01 MED ORDER — CITALOPRAM HYDROBROMIDE 20 MG PO TABS
40.0000 mg | ORAL_TABLET | Freq: Every day | ORAL | Status: DC
  Administered 2022-06-01 – 2022-06-04 (×4): 40 mg via ORAL
  Filled 2022-06-01 (×4): qty 2

## 2022-06-01 MED ORDER — SODIUM CHLORIDE 0.9 % IV SOLN
250.0000 mg | Freq: Every day | INTRAVENOUS | Status: AC
  Administered 2022-06-01 – 2022-06-02 (×2): 250 mg via INTRAVENOUS
  Filled 2022-06-01 (×2): qty 20

## 2022-06-01 MED ORDER — PANTOPRAZOLE SODIUM 40 MG PO TBEC
40.0000 mg | DELAYED_RELEASE_TABLET | Freq: Every morning | ORAL | Status: DC
  Administered 2022-06-01 – 2022-06-04 (×4): 40 mg via ORAL
  Filled 2022-06-01 (×4): qty 1

## 2022-06-01 MED ORDER — ZOLPIDEM TARTRATE 5 MG PO TABS
5.0000 mg | ORAL_TABLET | Freq: Every evening | ORAL | Status: DC | PRN
  Administered 2022-06-03: 5 mg via ORAL
  Filled 2022-06-01: qty 1

## 2022-06-01 MED ORDER — POTASSIUM CHLORIDE CRYS ER 20 MEQ PO TBCR
20.0000 meq | EXTENDED_RELEASE_TABLET | Freq: Every day | ORAL | Status: DC
Start: 2022-06-01 — End: 2022-06-04
  Administered 2022-06-02 – 2022-06-04 (×3): 20 meq via ORAL
  Filled 2022-06-01 (×4): qty 1

## 2022-06-01 MED ORDER — HYDROCORTISONE (PERIANAL) 2.5 % EX CREA
TOPICAL_CREAM | Freq: Two times a day (BID) | CUTANEOUS | Status: DC
  Filled 2022-06-01: qty 28.35

## 2022-06-01 MED ORDER — DICYCLOMINE HCL 10 MG PO CAPS
20.0000 mg | ORAL_CAPSULE | Freq: Three times a day (TID) | ORAL | Status: DC
  Administered 2022-06-01 – 2022-06-04 (×12): 20 mg via ORAL
  Filled 2022-06-01 (×12): qty 2

## 2022-06-01 MED ORDER — DIPHENHYDRAMINE HCL 25 MG PO CAPS
25.0000 mg | ORAL_CAPSULE | Freq: Every day | ORAL | Status: DC | PRN
Start: 2022-06-01 — End: 2022-06-04

## 2022-06-01 MED ORDER — MECLIZINE HCL 12.5 MG PO TABS: 25.0000 mg | ORAL_TABLET | Freq: Three times a day (TID) | ORAL | Status: DC | PRN

## 2022-06-01 MED ORDER — FAMOTIDINE 20 MG PO TABS
20.0000 mg | ORAL_TABLET | Freq: Two times a day (BID) | ORAL | Status: DC
  Administered 2022-06-01 – 2022-06-04 (×8): 20 mg via ORAL
  Filled 2022-06-01 (×8): qty 1

## 2022-06-01 NOTE — Progress Notes (Signed)
*  PRELIMINARY RESULTS* Echocardiogram 2D Echocardiogram has been performed.  Samuel Germany 06/01/2022, 10:33 AM

## 2022-06-01 NOTE — Progress Notes (Signed)
On toilet and felt weak.  Had to get in [employer] to get back to bed.  BP lying was 73/48 pulse 91.  Dr. Manuella Ghazi ordered bolus.  Husband at bedside.

## 2022-06-01 NOTE — Progress Notes (Signed)
   06/01/22 1511  Assess: MEWS Score  Temp 98 F (36.7 C)  BP (!) 107/58  MAP (mmHg) 74  Pulse Rate 74  Resp 16  SpO2 97 %  O2 Device Room Air  Assess: MEWS Score  MEWS Temp 0  MEWS Systolic 0  MEWS Pulse 0  MEWS RR 0  MEWS LOC 0  MEWS Score 0  MEWS Score Color Green  Assess: if the MEWS score is Yellow or Red  Were vital signs taken at a resting state? Yes  Does the patient meet 2 or more of the SIRS criteria? No  MEWS guidelines implemented *See Row Information* No, previously yellow, continue vital signs every 4 hours  Treat  MEWS Interventions Other (Comment) (after bolus)  Take Vital Signs  Increase Vital Sign Frequency  Yellow: Q 2hr X 2 then Q 4hr X 2, if remains yellow, continue Q 4hrs  Notify: Charge Nurse/RN  Name of Charge Nurse/RN Notified Myself  Date Charge Nurse/RN Notified 06/01/22  Time Charge Nurse/RN Notified 1511  Notify: Provider  Provider Name/Title Dr. Manuella Ghazi  Date Provider Notified 06/01/22  Time Provider Notified 1511  Method of Notification Page  Notification Reason Other (Comment) (improved)  Provider response No new orders  Date of Provider Response 06/01/22  Time of Provider Response 1511  Document  Patient Outcome Stabilized after interventions  Assess: SIRS CRITERIA  SIRS Temperature  0  SIRS Pulse 0  SIRS Respirations  0  SIRS WBC 1  SIRS Score Sum  1

## 2022-06-01 NOTE — Procedures (Signed)
Patient Name: Natalin Bible  MRN: [id number]  Epilepsy Attending: Lora Havens  Referring Physician/Provider: Jani Gravel, MD  Date: 06/01/2022 Duration: 22.09 mins  Patient history: 61 year old female with syncope.  EEG to evaluate for seizure.  Level of alertness: Awake  AEDs during EEG study: None  Technical aspects: This EEG study was done with scalp electrodes positioned according to the 10-20 International system of electrode placement. Electrical activity was reviewed with band pass filter of 1-'70Hz'$ , sensitivity of 7 uV/mm, display speed of 51m/sec with a '60Hz'$  notched filter applied as appropriate. EEG data were recorded continuously and digitally stored.  Video monitoring was available and reviewed as appropriate.  Description: The posterior dominant rhythm consists of 8 Hz activity of moderate voltage (25-35 uV) seen predominantly in posterior head regions, symmetric and reactive to eye opening and eye closing. Hyperventilation and photic stimulation were not performed.     IMPRESSION: This study is within normal limits. No seizures or epileptiform discharges were seen throughout the recording.  A normal interictal EEG does not exclude nor support the diagnosis of epilepsy.  Grafton Warzecha OBarbra Sarks

## 2022-06-01 NOTE — Progress Notes (Signed)
EEG complete - results pending 

## 2022-06-01 NOTE — Progress Notes (Signed)
PT Cancellation Note  Patient Details Name: Marketia Stallsmith MRN: [id number] DOB: February 03, 1961   Cancelled Treatment:    Reason Eval/Treat Not Completed: Medical issues which prohibited therapy.  Patient presently having orthostatic hypotension and receiving treatment for this.  Will check back for vestibular assessment when BP stable.   3:53 PM, 06/01/22 Lonell Grandchild, MPT Physical Therapist with [facility] [phone] office [phone] mobile phone

## 2022-06-01 NOTE — Progress Notes (Signed)
   06/01/22 1400  Assess: MEWS Score  Temp 98 F (36.7 C)  BP (!) 79/57  Pulse Rate 91  Resp 16  Level of Consciousness Alert  SpO2 99 %  O2 Device Room Air  Assess: MEWS Score  MEWS Temp 0  MEWS Systolic 2  MEWS Pulse 0  MEWS RR 0  MEWS LOC 0  MEWS Score 2  MEWS Score Color Yellow  Assess: if the MEWS score is Yellow or Red  Were vital signs taken at a resting state? No (had just gottne back in bed)  Focused Assessment No change from prior assessment  Does the patient meet 2 or more of the SIRS criteria? No  MEWS guidelines implemented *See Row Information* Yes  Treat  MEWS Interventions Other (Comment) (Dr. Manuella Ghazi ordered bolus)  Take Vital Signs  Increase Vital Sign Frequency  Yellow: Q 2hr X 2 then Q 4hr X 2, if remains yellow, continue Q 4hrs  Notify: Charge Nurse/RN  Name of Charge Nurse/RN Notified Myself  Date Charge Nurse/RN Notified 06/01/22  Time Charge Nurse/RN Notified 1400  Notify: Provider  Provider Name/Title Dr. Manuella Ghazi  Date Provider Notified 06/01/22  Time Provider Notified 1400  Method of Notification Page  Notification Reason Change in status  Provider response See new orders (bolus)  Date of Provider Response 06/01/22  Time of Provider Response 1400  Document  Patient Outcome Stabilized after interventions  Assess: SIRS CRITERIA  SIRS Temperature  0  SIRS Pulse 1  SIRS Respirations  0  SIRS WBC 1  SIRS Score Sum  2

## 2022-06-01 NOTE — ED Notes (Signed)
Patient transported to CT 

## 2022-06-01 NOTE — Progress Notes (Signed)
PROGRESS NOTE    Wendy Barber  PPI:[id number] DOB: [birth year] DOA: 05/31/2022 PCP: [name], MD   Brief Narrative:     Wendy Barber is a 61 y.o. female with medical history significant of fibromyalgia, hypertension, Dm2, DVT/ PE w IVC, h/o pseudomyxoma peritonei, s/p HIPEC x2 (2004, 2018), posterior meningioma, w hx of recent admission for rectal bleeding/ anemia, and hx of dizziness / vertigo presents with c/o "passing out".  She was admitted with concern for syncope versus seizure.  2D echocardiogram and carotid ultrasound are unremarkable and EEG is pending.  She is also noted to have some AKI with question of vertigo versus orthostasis.  Assessment & Plan:   Principal Problem:   Syncope Active Problems:   HTN (hypertension)   DM2 (diabetes mellitus, type 2) (HCC)   Iron deficiency anemia due to chronic blood loss   History of pulmonary embolus (PE)   Hypotension  Assessment and Plan:  Syncope vs Seizure CT head with no acute findings 2D echocardiogram and carotid ultrasound as below, unremarkable Check orthostatic bp Tele, cycle cardiac markers Neuro check EEG Neurology consult pending EEG results PT vestibular evaluation for vertigo symptoms   Hypotension Resolved with ns iv Cycle cardiac markers Check cortisol Check cardiac echo as above   Anemia Noted to have iron deficiency and Feraheme ordered Cont Folic acid '1mg'$  po qday Monitor CBC in a.m.   Hx of PE/ DVT S/p IVC Off Xarelto due to recent GI bleeding   Mild AKI Hydrate gently with ns iv at 53m per hour x 10 hr Hold Diovan/ hydrochlorothiazide Check cmp in am   Elevated lactic acid Pt received iv abx in ED Pt doesn't appear to be septic, will hold off on further abx   Hypertension Hold Diovan/ hydrochlorothiazide Cont propranolol '20mg'$  po bid   Gerd Cont Protonix '40mg'$  po qday Cont Pepcid 40-> '20mg'$  po bid   Nausea Cont Zofran '8mg'$  po tid prn    Chronic diarrhea Cont Lomotil 2 po  qid Cont Immodium '2mg'$  prn  Cont KCL 20 meq po qday   Muscle Spasm Cont Norflex '100mg'$  po qhs prn    Dm2 Hemoglobin A1c 6.5% Fsbs ac and qhs, ISS   Anxiety Cont Celexa '40mg'$  po qday Cont Wellbutrin XR '150mg'$  po qday Hold Buspar '15mg'$  po tid (redundant)   Insomnia Lunesta -> Ambien '5mg'$  po qhs prn    Hemorrhoids Cont Annusol   Vertigo Meclizine prn   Obesity -BMI 38.50 -Lifestyle changes outpatient   DVT prophylaxis: Lovenox Code Status: Full Family Communication: None at bedside Disposition Plan:  Status is: Observation The patient will require care spanning > 2 midnights and should be moved to inpatient because: Need for IV fluid   Consultants:  None  Procedures:  See below  Antimicrobials:  None   Subjective: Patient seen and evaluated today with ongoing symptoms of dizziness particularly when she turns her head.  She does get lightheaded and dizzy with standing up as well.  Objective: Vitals:   06/01/22 0055 06/01/22 0423 06/01/22 0500 06/01/22 0939  BP: 104/67 116/79  100/72  Pulse: 92 85  85  Resp: '20 16  18  '$ Temp: 98.4 F (36.9 C) 98.4 F (36.9 C)  97.8 F (36.6 C)  TempSrc:    Oral  SpO2: 100% 96%  99%  Weight: 107 kg  108.2 kg   Height: '5\' 6"'$  (1.676 m)       Intake/Output Summary (Last 24 hours) at 06/01/2022 1325 Last data filed at  06/01/2022 0900 Gross per 24 hour  Intake 1480 ml  Output 100 ml  Net 1380 ml   Filed Weights   05/31/22 1629 06/01/22 0055 06/01/22 0500  Weight: 103.9 kg 107 kg 108.2 kg    Examination:  General exam: Appears calm and comfortable  Respiratory system: Clear to auscultation. Respiratory effort normal. Cardiovascular system: S1 & S2 heard, RRR.  Gastrointestinal system: Abdomen is soft Central nervous system: Alert and awake Extremities: No edema Skin: No significant lesions noted Psychiatry: Flat affect.    Data Reviewed: I have personally reviewed following labs and imaging studies  CBC: Recent  Labs  Lab 05/30/22 1257 05/31/22 1945 06/01/22 0520  WBC 7.8 12.1* 11.5*  NEUTROABS  --  8.4*  --   HGB 9.1* 8.3* 7.7*  HCT 28.3* 27.0* 24.7*  MCV 87.7 91.2 90.1  PLT 367.0 238 096   Basic Metabolic Panel: Recent Labs  Lab 05/30/22 1257 05/31/22 1945 06/01/22 0520  NA 139 137 136  K 4.3 4.4 4.2  CL 102 104 106  CO2 26 23 21*  GLUCOSE 173* 160* 147*  BUN 21 24* 27*  CREATININE 1.22* 1.41* 1.32*  CALCIUM 9.5 8.7* 8.6*   GFR: Estimated Creatinine Clearance: 57.2 mL/min (A) (by C-G formula based on SCr of 1.32 mg/dL (H)). Liver Function Tests: Recent Labs  Lab 05/30/22 1257 05/31/22 1945 06/01/22 0520  AST '20 20 19  '$ ALT '14 16 16  '$ ALKPHOS 146* 141* 140*  BILITOT 0.3 0.5 0.7  PROT 7.7 7.5 7.4  ALBUMIN 4.0 3.5 3.4*   No results for input(s): "LIPASE", "AMYLASE" in the last 168 hours. No results for input(s): "AMMONIA" in the last 168 hours. Coagulation Profile: Recent Labs  Lab 05/31/22 1945  INR 1.1   Cardiac Enzymes: No results for input(s): "CKTOTAL", "CKMB", "CKMBINDEX", "TROPONINI" in the last 168 hours. BNP (last 3 results) No results for input(s): "PROBNP" in the last 8760 hours. HbA1C: Recent Labs    06/01/22 0520  HGBA1C 6.5*   CBG: No results for input(s): "GLUCAP" in the last 168 hours. Lipid Profile: No results for input(s): "CHOL", "HDL", "LDLCALC", "TRIG", "CHOLHDL", "LDLDIRECT" in the last 72 hours. Thyroid Function Tests: No results for input(s): "TSH", "T4TOTAL", "FREET4", "T3FREE", "THYROIDAB" in the last 72 hours. Anemia Panel: Recent Labs    06/01/22 0520  VITAMINB12 209  FOLATE >40.0  FERRITIN 10*  TIBC 533*  IRON 22*   Sepsis Labs: Recent Labs  Lab 05/31/22 1945 05/31/22 2224  LATICACIDVEN 3.4* 2.1*    Recent Results (from the past 240 hour(s))  Blood culture (routine x 2)     Status: None (Preliminary result)   Collection Time: 05/31/22  5:24 PM   Specimen: BLOOD LEFT ARM  Result Value Ref Range Status   Specimen  Description BLOOD LEFT ARM  Final   Special Requests   Final    BOTTLES DRAWN AEROBIC ONLY Blood Culture adequate volume   Culture   Final    NO GROWTH < 24 HOURS Performed at [facility], [address], [place], [place] [address]    Report Status PENDING  Incomplete  Blood culture (routine x 2)     Status: None (Preliminary result)   Collection Time: 05/31/22  7:45 PM   Specimen: Left Antecubital; Blood  Result Value Ref Range Status   Specimen Description LEFT ANTECUBITAL  Final   Special Requests   Final    BOTTLES DRAWN AEROBIC AND ANAEROBIC Blood Culture adequate volume   Culture  Final    NO GROWTH < 24 HOURS Performed at [facility], [address], [place], [place] [address]    Report Status PENDING  Incomplete         Radiology Studies: ECHOCARDIOGRAM COMPLETE  Result Date: 06/01/2022    ECHOCARDIOGRAM REPORT   Patient Name:   HELMI HECHAVARRIA Date of Exam: 06/01/2022 Medical Rec #:  [id number]      Height:       66.0 in Accession #:    [id number]     Weight:       238.5 lb Date of Birth:  10/09/1961      BSA:          2.155 m Patient Age:    62 years       BP:           116/79 mmHg Patient Gender: F              HR:           85 bpm. Exam Location:  [facility] Procedure: 2D Echo, Cardiac Doppler and Color Doppler Indications:    R55 Syncope  History:        Patient has no prior history of Echocardiogram examinations.                 Risk Factors:Hypertension and Diabetes. 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  Sonographer:    Alvino Chapel RCS Referring Phys: [place]  1. Left ventricular ejection fraction, by estimation, is 60 to 65%. The left ventricle has normal function. The left ventricle has no regional wall motion abnormalities. Left ventricular diastolic parameters were normal.  2. Right ventricular systolic function is normal. The right ventricular size is normal. Tricuspid regurgitation signal is inadequate  for assessing PA pressure.  3. A small pericardial effusion is present. The pericardial effusion is circumferential. There is no evidence of cardiac tamponade.  4. The mitral valve is grossly normal. No evidence of mitral valve regurgitation. No evidence of mitral stenosis.  5. The aortic valve is tricuspid. Aortic valve regurgitation is not visualized. No aortic stenosis is present.  6. The inferior vena cava is normal in size with greater than 50% respiratory variability, suggesting right atrial pressure of 3 mmHg. FINDINGS  Left Ventricle: Left ventricular ejection fraction, by estimation, is 60 to 65%. The left ventricle has normal function. The left ventricle has no regional wall motion abnormalities. The left ventricular internal cavity size was normal in size. There is  no left ventricular hypertrophy. Left ventricular diastolic parameters were normal. Right Ventricle: The right ventricular size is normal. No increase in right ventricular wall thickness. Right ventricular systolic function is normal. Tricuspid regurgitation signal is inadequate for assessing PA pressure. Left Atrium: Left atrial size was normal in size. Right Atrium: Right atrial size was normal in size. Pericardium: A small pericardial effusion is present. The pericardial effusion is circumferential. There is no evidence of cardiac tamponade. Mitral Valve: The mitral valve is grossly normal. No evidence of mitral valve regurgitation. No evidence of mitral valve stenosis. Tricuspid Valve: The tricuspid valve is grossly normal. Tricuspid valve regurgitation is not demonstrated. No evidence of tricuspid stenosis. Aortic Valve: The aortic valve is tricuspid. Aortic valve regurgitation is not visualized. No aortic stenosis is present. Pulmonic Valve: The pulmonic valve was grossly normal. Pulmonic valve regurgitation is trivial. No evidence of  pulmonic stenosis. Aorta: The aortic root is normal in size and structure. Venous: The inferior vena  cava is normal in size with greater than 50% respiratory variability, suggesting right atrial pressure of 3 mmHg. IAS/Shunts: The atrial septum is grossly normal.  LEFT VENTRICLE PLAX 2D LVIDd:         4.60 cm   Diastology LVIDs:         2.70 cm   LV e' medial:    5.87 cm/s LV PW:         1.00 cm   LV E/e' medial:  9.0 LV IVS:        1.00 cm   LV e' lateral:   9.79 cm/s LVOT diam:     2.40 cm   LV E/e' lateral: 5.4 LV SV:         67 LV SV Index:   31 LVOT Area:     4.52 cm  RIGHT VENTRICLE RV S prime:     14.80 cm/s TAPSE (M-mode): 2.0 cm LEFT ATRIUM             Index        RIGHT ATRIUM           Index LA diam:        3.70 cm 1.72 cm/m   RA Area:     14.90 cm LA Vol (A2C):   49.8 ml 23.11 ml/m  RA Volume:   38.00 ml  17.63 ml/m LA Vol (A4C):   54.0 ml 25.05 ml/m LA Biplane Vol: 51.9 ml 24.08 ml/m  AORTIC VALVE LVOT Vmax:   82.00 cm/s LVOT Vmean:  55.800 cm/s LVOT VTI:    0.149 m  AORTA Ao Root diam: 3.40 cm MITRAL VALVE MV Area (PHT): 4.49 cm    SHUNTS MV Decel Time: 169 msec    Systemic VTI:  0.15 m MV E velocity: 52.90 cm/s  Systemic Diam: 2.40 cm MV A velocity: 65.50 cm/s MV E/A ratio:  0.81 Eleonore Chiquito MD Electronically signed by Eleonore Chiquito MD Signature Date/Time: 06/01/2022/10:45:58 AM    Final    US Carotid Bilateral  Result Date: 06/01/2022 CLINICAL DATA:  61 year old female with a history of syncope EXAM: BILATERAL CAROTID DUPLEX ULTRASOUND TECHNIQUE: Pearline Cables scale imaging, color Doppler and duplex ultrasound were performed of bilateral carotid and vertebral arteries in the neck. COMPARISON:  None Available. FINDINGS: Criteria: Quantification of carotid stenosis is based on velocity parameters that correlate the residual internal carotid diameter with NASCET-based stenosis levels, using the diameter of the distal internal carotid lumen as the denominator for stenosis measurement. The following velocity measurements were obtained: RIGHT ICA:  Systolic 77 cm/sec, Diastolic 23 cm/sec CCA:  65 cm/sec  SYSTOLIC ICA/CCA RATIO:  1.2 ECA:  17 cm/sec LEFT ICA:  Systolic 66 cm/sec, Diastolic 28 cm/sec CCA:  74 cm/sec SYSTOLIC ICA/CCA RATIO:  0.9 ECA:  72 cm/sec Right Brachial SBP: Not acquired Left Brachial SBP: Not acquired RIGHT CAROTID ARTERY: No significant calcified disease of the right common carotid artery. Intermediate waveform maintained. Homogeneous plaque without significant calcifications at the right carotid bifurcation. Low resistance waveform of the right ICA. No significant tortuosity. RIGHT VERTEBRAL ARTERY: Antegrade flow with low resistance waveform. LEFT CAROTID ARTERY: No significant calcified disease of the left common carotid artery. Intermediate waveform maintained. Homogeneous plaque at the left carotid bifurcation without significant calcifications. Low resistance waveform of the left ICA. LEFT VERTEBRAL ARTERY:  Antegrade flow with low resistance waveform. IMPRESSION: Color duplex indicates minimal homogeneous plaque,  with no hemodynamically significant stenosis by duplex criteria in the extracranial cerebrovascular circulation. Signed, Dulcy Fanny. Nadene Rubins, RPVI [facility] Radiology Electronically Signed   By: Corrie Mckusick D.O.   On: 06/01/2022 09:46   CT HEAD WO CONTRAST (5MM)  Result Date: 06/01/2022 CLINICAL DATA:  Syncope/presyncope, cerebrovascular cause suspected syncope vs seizure EXAM: CT HEAD WITHOUT CONTRAST TECHNIQUE: Contiguous axial images were obtained from the base of the skull through the vertex without intravenous contrast. RADIATION DOSE REDUCTION: This exam was performed according to the departmental dose-optimization program which includes automated exposure control, adjustment of the mA and/or kV according to patient size and/or use of iterative reconstruction technique. COMPARISON:  None Available. FINDINGS: Brain: Partially calcified mass within the left posterior fossa along the tentorium at the torcula is  unchanged from prior examination, better characterized on prior examination as a partially calcified meningioma. No associated abnormal mass effect. No midline shift. No new intra or extra-axial mass lesion or fluid collection. No evidence of acute intracranial hemorrhage or infarct. Ventricular size is normal. Cerebellum is unremarkable. Vascular: No hyperdense vessel or unexpected calcification. Skull: Normal. Negative for fracture or focal lesion. Sinuses/Orbits: No acute finding. Other: There is fluid opacification of several inferior mastoid air cells bilaterally, left greater than right. No associated osseous erosion. Middle ear cavities are clear bilaterally. IMPRESSION: 1. No acute intracranial abnormality. No evidence of acute intracranial hemorrhage or infarct. 2. Stable partially calcified left posterior fossa meningioma. 3. Bilateral mastoid effusions, left greater than right. Electronically Signed   By: Fidela Salisbury M.D.   On: 06/01/2022 00:44   DG Chest Portable 1 View  Result Date: 05/31/2022 CLINICAL DATA:  Syncope, hypotension EXAM: PORTABLE CHEST 1 VIEW COMPARISON:  None Available. FINDINGS: Heart and mediastinal contours are within normal limits. No focal opacities or effusions. No acute bony abnormality. IMPRESSION: No active disease. Electronically Signed   By: Rolm Baptise M.D.   On: 05/31/2022 23:13        Scheduled Meds:  buPROPion  150 mg Oral Daily   busPIRone  15 mg Oral TID   citalopram  40 mg Oral Daily   dicyclomine  20 mg Oral TID AC & HS   diphenoxylate-atropine  2 tablet Oral QID   enoxaparin (LOVENOX) injection  40 mg Subcutaneous Q24H   famotidine  20 mg Oral BID   folic acid  2 mg Oral Daily   hydrocortisone   Rectal BID   pantoprazole  40 mg Oral q morning   potassium chloride  20 mEq Oral Daily   propranolol  20 mg Oral BID   Continuous Infusions:  ferric gluconate (FERRLECIT) IVPB       LOS: 0 days    Time spent: 35 minutes    Anjelita Sheahan Darleen Crocker,  DO [facility]  If 7PM-7AM, please contact night-coverage [email or web address] 06/01/2022, 1:25 PM

## 2022-06-01 NOTE — ED Notes (Signed)
Patient back from CT.

## 2022-06-01 NOTE — Progress Notes (Signed)
Attempt to get orthostatic vitals after obtaining lying and sitting, when pt stood, she started swaying saying she could not stand and felt like she was going to pass out. Unable to get standing vitals at this time. Will attempt again with next set of vital signs.

## 2022-06-01 NOTE — TOC Initial Note (Signed)
Transition of Care [facility]) - Initial/Assessment Note    Patient Details  Name: Wendy Barber MRN: [id number] Date of Birth: 02-25-1961  Transition of Care [facility]) CM/SW Contact:    Shade Flood, LCSW Phone Number: 06/01/2022, 4:00 PM  Clinical Narrative:                  Pt admitted from home. She has a high readmission risk score. Spoke with pt and her husband today to assess and review dc planning. Pt and her husband reside together and pt reports that she is mostly independent in ADLs at home but that she doesn't go out much. She has a cane and walker for ambulation as needed. Pt also has a shower chair and a frame over the toilet. Per pt, her husband takes her to appointments and she is able to get her medications as needed.  Pt states that her daughter who is an Therapist, sports and she lives about 15 minutes away from pt. Pt has had HH in the past though she states that she does not think she will need that at dc. Pt aware that TOC can arrange if she does need it.  TOC will follow and assist if needs arise.  Expected Discharge Plan: Home/Self Care Barriers to Discharge: Continued Medical Work up   Patient Goals and CMS Choice Patient states their goals for this hospitalization and ongoing recovery are:: go home      Expected Discharge Plan and Services Expected Discharge Plan: Home/Self Care In-house Referral: Clinical Social Work     Living arrangements for the past 2 months: Single Family Home                                      Prior Living Arrangements/Services Living arrangements for the past 2 months: Single Family Home Lives with:: Spouse Patient language and need for interpreter reviewed:: Yes Do you feel safe going back to the place where you live?: Yes      Need for Family Participation in Patient Care: Yes (Comment) Care giver support system in place?: Yes (comment) Current home services: DME Criminal Activity/Legal Involvement Pertinent to Current  Situation/Hospitalization: No - Comment as needed  Activities of Daily Living Home Assistive Devices/Equipment: None ADL Screening (condition at time of admission) Patient's cognitive ability adequate to safely complete daily activities?: Yes Is the patient deaf or have difficulty hearing?: No Does the patient have difficulty seeing, even when wearing glasses/contacts?: No Does the patient have difficulty concentrating, remembering, or making decisions?: No Patient able to express need for assistance with ADLs?: Yes Does the patient have difficulty dressing or bathing?: No Independently performs ADLs?: Yes (appropriate for developmental age) Does the patient have difficulty walking or climbing stairs?: Yes Weakness of Legs: Both Weakness of Arms/Hands: None  Permission Sought/Granted                  Emotional Assessment Appearance:: Appears stated age Attitude/Demeanor/Rapport: Engaged Affect (typically observed): Pleasant Orientation: : Oriented to Self, Oriented to Place, Oriented to  Time, Oriented to Situation Alcohol / Substance Use: Not Applicable Psych Involvement: No (comment)  Admission diagnosis:  Syncope [R55] Hypotension, unspecified hypotension type [I95.9] Syncope, unspecified syncope type [R55] Patient Active Problem List   Diagnosis Date Noted   Syncope 05/31/2022   Hypotension 05/31/2022   Acute lower GI bleeding 05/04/2022   AKI (acute kidney injury) ([place]) 05/04/2022   History of  deep vein thrombosis (DVT) of lower extremity 05/04/2022   History of pulmonary embolus (PE) 05/04/2022   Anxiety 05/04/2022   Pernicious anemia 05/14/2021   Iron deficiency anemia due to chronic blood loss 05/14/2021   Abdominal wall abscess    Hypokalemia    Surgical wound infection 01/16/2021   C. 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[place]) 03/19/2020   Goals of care, counseling/discussion 03/19/2020   Malignant  pseudomyxoma peritonei ([place]) 03/19/2020   DVT of deep femoral vein, left ([place]) 03/19/2020   Pulmonary embolism, bilateral ([place]) 03/19/2020   Presence of IVC filter 03/19/2020   Hepatic encephalopathy ([place]) 11/22/2019   Confusion 11/21/2019   Autoimmune hepatitis ([place]) 11/21/2019   HTN (hypertension) 11/21/2019   DM2 (diabetes mellitus, type 2) ([place]) 11/21/2019   AMS (altered mental status) 11/21/2019   Closed right ankle fracture 08/27/2019   PCP:  [name], MD Pharmacy:   [facility] [place], [place] - [address] [place] [address] [place] [address] [place] [address] Phone: [phone] Fax: [phone]  [facility] [place], [place] [address] [place] [address] Phone: [phone] Fax: [phone]     Social Determinants of Health (SDOH) Interventions    Readmission Risk Interventions    06/01/2022    3:59 PM 05/05/2022    8:39 AM  Readmission Risk Prevention Plan  Transportation Screening Complete Complete  PCP or Specialist Appt within 5-7 Days  Complete  Home Care Screening  Complete  Medication Review (RN CM)  Complete  Medication Review (RN Care Manager) Complete   HRI or [place] Complete   SW Recovery Care/Counseling Consult Complete   Palliative Care Screening Not [place] Not Applicable

## 2022-06-02 DIAGNOSIS — R55 Syncope and collapse: Secondary | ICD-10-CM | POA: Diagnosis not present

## 2022-06-02 LAB — CBC
HCT: 22.8 % — ABNORMAL LOW (ref 36.0–46.0)
Hemoglobin: 6.9 g/dL — CL (ref 12.0–15.0)
MCH: 27.5 pg (ref 26.0–34.0)
MCHC: 30.3 g/dL (ref 30.0–36.0)
MCV: 90.8 fL (ref 80.0–100.0)
Platelets: 154 10*3/uL (ref 150–400)
RBC: 2.51 MIL/uL — ABNORMAL LOW (ref 3.87–5.11)
RDW: 15.9 % — ABNORMAL HIGH (ref 11.5–15.5)
WBC: 10.2 10*3/uL (ref 4.0–10.5)
nRBC: 0.3 % — ABNORMAL HIGH (ref 0.0–0.2)

## 2022-06-02 LAB — BASIC METABOLIC PANEL
Anion gap: 7 (ref 5–15)
BUN: 27 mg/dL — ABNORMAL HIGH (ref 6–20)
CO2: 21 mmol/L — ABNORMAL LOW (ref 22–32)
Calcium: 8.5 mg/dL — ABNORMAL LOW (ref 8.9–10.3)
Chloride: 109 mmol/L (ref 98–111)
Creatinine, Ser: 1.3 mg/dL — ABNORMAL HIGH (ref 0.44–1.00)
GFR, Estimated: 47 mL/min — ABNORMAL LOW (ref >60)
Glucose, Bld: 145 mg/dL — ABNORMAL HIGH (ref 70–99)
Potassium: 3.7 mmol/L (ref 3.5–5.1)
Sodium: 137 mmol/L (ref 135–145)

## 2022-06-02 LAB — PREPARE RBC (CROSSMATCH)

## 2022-06-02 LAB — MAGNESIUM: Magnesium: 1.8 mg/dL (ref 1.7–2.4)

## 2022-06-02 LAB — HEMOGLOBIN AND HEMATOCRIT, BLOOD
HCT: 27.1 % — ABNORMAL LOW (ref 36.0–46.0)
Hemoglobin: 8.3 g/dL — ABNORMAL LOW (ref 12.0–15.0)

## 2022-06-02 MED ORDER — MUSCLE RUB 10-15 % EX CREA
TOPICAL_CREAM | CUTANEOUS | Status: DC | PRN
  Administered 2022-06-02: 1 via TOPICAL
  Filled 2022-06-02: qty 85

## 2022-06-02 MED ORDER — SODIUM CHLORIDE 0.9% IV SOLUTION
Freq: Once | INTRAVENOUS | Status: AC
Start: 2022-06-02 — End: 2022-06-02

## 2022-06-02 NOTE — Progress Notes (Signed)
PROGRESS NOTE    Wendy Barber  ONG:[id number] DOB: 1961-10-13 DOA: 05/31/2022 PCP: [name], MD   Brief Narrative:    Wendy Barber is a 61 y.o. female with medical history significant of fibromyalgia, hypertension, Dm2, DVT/ PE w IVC, h/o pseudomyxoma peritonei, s/p HIPEC x2 (2004, 2018), posterior meningioma, w hx of recent admission for rectal bleeding/ anemia, and hx of dizziness / vertigo presents with c/o "passing out".  She was admitted with concern for syncope versus seizure.  2D echocardiogram and carotid ultrasound are unremarkable and EEG is pending.  She is also noted to have some AKI with question of vertigo versus orthostasis.  Assessment & Plan:   Principal Problem:   Syncope Active Problems:   HTN (hypertension)   DM2 (diabetes mellitus, type 2) (HCC)   Iron deficiency anemia due to chronic blood loss   History of pulmonary embolus (PE)   Hypotension  Assessment and Plan:   Syncope likely orthostatic vs vertigo CT head with no acute findings 2D echocardiogram and carotid ultrasound as below, unremarkable Check orthostatic bp Tele, cycle cardiac markers Neuro check EEG within normal limits PT vestibular evaluation for vertigo symptoms pending   Hypotension-recurrent Resolved with ns iv Cycle cardiac markers Check cortisol Echo WNL   Anemia worsening 1 U prbc transfusion 8/10; no overt bleeding noted and stool occult negative on admission Noted to have iron deficiency and Feraheme ordered Cont Folic acid '1mg'$  po qday Monitor CBC in a.m.   Hx of PE/ DVT S/p IVC Off Xarelto due to recent GI bleeding   Mild AKI Hydrate gently with ns iv at 66m per hour x 10 hr Hold Diovan/ hydrochlorothiazide Check cmp in am   Elevated lactic acid Pt received iv abx in ED Pt doesn't appear to be septic, will hold off on further abx   Hypertension Hold Diovan/ hydrochlorothiazide Cont propranolol '20mg'$  po bid   Gerd Cont Protonix '40mg'$  po qday Cont  Pepcid 40-> '20mg'$  po bid   Nausea Cont Zofran '8mg'$  po tid prn    Chronic diarrhea Cont Lomotil 2 po qid Cont Immodium '2mg'$  prn  Cont KCL 20 meq po qday   Muscle Spasm Cont Norflex '100mg'$  po qhs prn    Dm2 Hemoglobin A1c 6.5% Fsbs ac and qhs, ISS   Anxiety Cont Celexa '40mg'$  po qday Cont Wellbutrin XR '150mg'$  po qday Hold Buspar '15mg'$  po tid (redundant)   Insomnia Lunesta -> Ambien '5mg'$  po qhs prn    Hemorrhoids Cont Annusol   Vertigo Meclizine prn    Obesity -BMI 38.50 -Lifestyle changes outpatient    DVT prophylaxis: Lovenox Code Status: Full Family Communication: None at bedside Disposition Plan:  Status is: Inpatient Remains inpatient appropriate because: Need for IVF and PRBC.    Consultants:  None   Procedures:  See below   Antimicrobials:  None     Subjective: Patient seen and evaluated today with some bilateral hip pain and aching in both legs.  She denies any bowel movements or overt bleeding overnight.  She continues to become dizzy with movement.  Objective: Vitals:   06/02/22 0628 06/02/22 0745 06/02/22 0824 06/02/22 1045  BP:  123/75 133/69 128/73  Pulse:  78 76 76  Resp:  '16 16 16  '$ Temp:  98.5 F (36.9 C) 98.3 F (36.8 C) 98.4 F (36.9 C)  TempSrc:  Oral Oral Oral  SpO2:  96% 97%   Weight: 110.5 kg     Height:        Intake/Output  Summary (Last 24 hours) at 06/02/2022 1104 Last data filed at 06/02/2022 1047 Gross per 24 hour  Intake 1038 ml  Output 500 ml  Net 538 ml   Filed Weights   06/01/22 0055 06/01/22 0500 06/02/22 0628  Weight: 107 kg 108.2 kg 110.5 kg    Examination:  General exam: Appears calm and comfortable  Respiratory system: Clear to auscultation. Respiratory effort normal. Cardiovascular system: S1 & S2 heard, RRR.  Gastrointestinal system: Abdomen is soft Central nervous system: Alert and awake Extremities: No edema Skin: No significant lesions noted Psychiatry: Flat affect.    Data Reviewed: I have  personally reviewed following labs and imaging studies  CBC: Recent Labs  Lab 05/30/22 1257 05/31/22 1945 06/01/22 0520 06/02/22 0517  WBC 7.8 12.1* 11.5* 10.2  NEUTROABS  --  8.4*  --   --   HGB 9.1* 8.3* 7.7* 6.9*  HCT 28.3* 27.0* 24.7* 22.8*  MCV 87.7 91.2 90.1 90.8  PLT 367.0 238 181 417   Basic Metabolic Panel: Recent Labs  Lab 05/30/22 1257 05/31/22 1945 06/01/22 0520 06/02/22 0517  NA 139 137 136 137  K 4.3 4.4 4.2 3.7  CL 102 104 106 109  CO2 26 23 21* 21*  GLUCOSE 173* 160* 147* 145*  BUN 21 24* 27* 27*  CREATININE 1.22* 1.41* 1.32* 1.30*  CALCIUM 9.5 8.7* 8.6* 8.5*  MG  --   --   --  1.8   GFR: Estimated Creatinine Clearance: 58.7 mL/min (A) (by C-G formula based on SCr of 1.3 mg/dL (H)). Liver Function Tests: Recent Labs  Lab 05/30/22 1257 05/31/22 1945 06/01/22 0520  AST '20 20 19  '$ ALT '14 16 16  '$ ALKPHOS 146* 141* 140*  BILITOT 0.3 0.5 0.7  PROT 7.7 7.5 7.4  ALBUMIN 4.0 3.5 3.4*   No results for input(s): "LIPASE", "AMYLASE" in the last 168 hours. No results for input(s): "AMMONIA" in the last 168 hours. Coagulation Profile: Recent Labs  Lab 05/31/22 1945  INR 1.1   Cardiac Enzymes: No results for input(s): "CKTOTAL", "CKMB", "CKMBINDEX", "TROPONINI" in the last 168 hours. BNP (last 3 results) No results for input(s): "PROBNP" in the last 8760 hours. HbA1C: Recent Labs    06/01/22 0520  HGBA1C 6.5*   CBG: No results for input(s): "GLUCAP" in the last 168 hours. Lipid Profile: No results for input(s): "CHOL", "HDL", "LDLCALC", "TRIG", "CHOLHDL", "LDLDIRECT" in the last 72 hours. Thyroid Function Tests: No results for input(s): "TSH", "T4TOTAL", "FREET4", "T3FREE", "THYROIDAB" in the last 72 hours. Anemia Panel: Recent Labs    06/01/22 0520  VITAMINB12 209  FOLATE >40.0  FERRITIN 10*  TIBC 533*  IRON 22*   Sepsis Labs: Recent Labs  Lab 05/31/22 1945 05/31/22 2224  LATICACIDVEN 3.4* 2.1*    Recent Results (from the past  240 hour(s))  Blood culture (routine x 2)     Status: None (Preliminary result)   Collection Time: 05/31/22  5:24 PM   Specimen: BLOOD LEFT ARM  Result Value Ref Range Status   Specimen Description BLOOD LEFT ARM  Final   Special Requests   Final    BOTTLES DRAWN AEROBIC ONLY Blood Culture adequate volume   Culture   Final    NO GROWTH 2 DAYS Performed at [facility], [address], [place], [place] [address]    Report Status PENDING  Incomplete  Blood culture (routine x 2)     Status: None (Preliminary result)   Collection Time: 05/31/22  7:45 PM  Specimen: Left Antecubital; Blood  Result Value Ref Range Status   Specimen Description LEFT ANTECUBITAL  Final   Special Requests   Final    BOTTLES DRAWN AEROBIC AND ANAEROBIC Blood Culture adequate volume   Culture   Final    NO GROWTH 2 DAYS Performed at [facility], [address], [place], [place] [address]    Report Status PENDING  Incomplete         Radiology Studies: EEG adult  Result Date: 06-18-2022 Lora Havens, MD     06/02/2022  8:23 AM Patient Name: Wendy Barber MRN: [id number] Epilepsy Attending: Lora Havens Referring Physician/Provider: Jani Gravel, MD Date: 06/18/22 Duration: 22.09 mins Patient history: 61 year old female with syncope.  EEG to evaluate for seizure. Level of alertness: Awake AEDs during EEG study: None Technical aspects: This EEG study was done with scalp electrodes positioned according to the 10-20 International system of electrode placement. Electrical activity was reviewed with band pass filter of 1-'70Hz'$ , sensitivity of 7 uV/mm, display speed of 26m/sec with a '60Hz'$  notched filter applied as appropriate. EEG data were recorded continuously and digitally stored.  Video monitoring was available and reviewed as appropriate. Description: The posterior dominant rhythm consists of 8 Hz activity of moderate voltage (25-35 uV) seen predominantly in posterior head regions, symmetric and reactive to  eye opening and eye closing. Hyperventilation and photic stimulation were not performed.   IMPRESSION: This study is within normal limits. No seizures or epileptiform discharges were seen throughout the recording. A normal interictal EEG does not exclude nor support the diagnosis of epilepsy. PLora Havens  ECHOCARDIOGRAM COMPLETE  Result Date: 808/26/2023   ECHOCARDIOGRAM REPORT   Patient Name:   Wendy SULTONDate of Exam: 826-Aug-2023Medical Rec #:  [id number]     Height:       66.0 in Accession #:    [id number]    Weight:       238.5 lb Date of Birth:  106/14/1962     BSA:          2.155 m Patient Age:    89years       BP:           116/79 mmHg Patient Gender: F              HR:           85 bpm. Exam Location:  [facility] NaProcedure: 2D Echo, Cardiac Doppler and Color Doppler Indications:    R55 Syncope  History:        Patient has no prior history of Echocardiogram examinations.                 Risk Factors:Hypertension and Diabetes. 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  Sonographer:    BAlvino ChapelRCS Referring Phys: [email or web address] 1. Left ventricular ejection fraction, by estimation, is 60 to 65%. The left ventricle has normal function. The left ventricle has no regional wall motion abnormalities. Left ventricular diastolic parameters were normal.  2. Right ventricular systolic function is normal. The right ventricular size is normal. Tricuspid regurgitation signal is inadequate for assessing PA pressure.  3. A small pericardial effusion is present. The pericardial effusion is circumferential. There is no evidence of cardiac tamponade.  4. The mitral valve is grossly normal. No evidence of mitral valve regurgitation.  No evidence of mitral stenosis.  5. The aortic valve is tricuspid. Aortic valve regurgitation is not visualized. No aortic stenosis is present.  6. The inferior vena cava is normal in size with greater than 50% respiratory  variability, suggesting right atrial pressure of 3 mmHg. FINDINGS  Left Ventricle: Left ventricular ejection fraction, by estimation, is 60 to 65%. The left ventricle has normal function. The left ventricle has no regional wall motion abnormalities. The left ventricular internal cavity size was normal in size. There is  no left ventricular hypertrophy. Left ventricular diastolic parameters were normal. Right Ventricle: The right ventricular size is normal. No increase in right ventricular wall thickness. Right ventricular systolic function is normal. Tricuspid regurgitation signal is inadequate for assessing PA pressure. Left Atrium: Left atrial size was normal in size. Right Atrium: Right atrial size was normal in size. Pericardium: A small pericardial effusion is present. The pericardial effusion is circumferential. There is no evidence of cardiac tamponade. Mitral Valve: The mitral valve is grossly normal. No evidence of mitral valve regurgitation. No evidence of mitral valve stenosis. Tricuspid Valve: The tricuspid valve is grossly normal. Tricuspid valve regurgitation is not demonstrated. No evidence of tricuspid stenosis. Aortic Valve: The aortic valve is tricuspid. Aortic valve regurgitation is not visualized. No aortic stenosis is present. Pulmonic Valve: The pulmonic valve was grossly normal. Pulmonic valve regurgitation is trivial. No evidence of pulmonic stenosis. Aorta: The aortic root is normal in size and structure. Venous: The inferior vena cava is normal in size with greater than 50% respiratory variability, suggesting right atrial pressure of 3 mmHg. IAS/Shunts: The atrial septum is grossly normal.  LEFT VENTRICLE PLAX 2D LVIDd:         4.60 cm   Diastology LVIDs:         2.70 cm   LV e' medial:    5.87 cm/s LV PW:         1.00 cm   LV E/e' medial:  9.0 LV IVS:        1.00 cm   LV e' lateral:   9.79 cm/s LVOT diam:     2.40 cm   LV E/e' lateral: 5.4 LV SV:         67 LV SV Index:   31 LVOT Area:      4.52 cm  RIGHT VENTRICLE RV S prime:     14.80 cm/s TAPSE (M-mode): 2.0 cm LEFT ATRIUM             Index        RIGHT ATRIUM           Index LA diam:        3.70 cm 1.72 cm/m   RA Area:     14.90 cm LA Vol (A2C):   49.8 ml 23.11 ml/m  RA Volume:   38.00 ml  17.63 ml/m LA Vol (A4C):   54.0 ml 25.05 ml/m LA Biplane Vol: 51.9 ml 24.08 ml/m  AORTIC VALVE LVOT Vmax:   82.00 cm/s LVOT Vmean:  55.800 cm/s LVOT VTI:    0.149 m  AORTA Ao Root diam: 3.40 cm MITRAL VALVE MV Area (PHT): 4.49 cm    SHUNTS MV Decel Time: 169 msec    Systemic VTI:  0.15 m MV E velocity: 52.90 cm/s  Systemic Diam: 2.40 cm MV A velocity: 65.50 cm/s MV E/A ratio:  0.81 Eleonore Chiquito MD Electronically signed by Eleonore Chiquito MD Signature Date/Time: 06/01/2022/10:45:58 AM    Final  US Carotid Bilateral  Result Date: 06/01/2022 CLINICAL DATA:  61 year old female with a history of syncope EXAM: BILATERAL CAROTID DUPLEX ULTRASOUND TECHNIQUE: Pearline Cables scale imaging, color Doppler and duplex ultrasound were performed of bilateral carotid and vertebral arteries in the neck. COMPARISON:  None Available. FINDINGS: Criteria: Quantification of carotid stenosis is based on velocity parameters that correlate the residual internal carotid diameter with NASCET-based stenosis levels, using the diameter of the distal internal carotid lumen as the denominator for stenosis measurement. The following velocity measurements were obtained: RIGHT ICA:  Systolic 77 cm/sec, Diastolic 23 cm/sec CCA:  65 cm/sec SYSTOLIC ICA/CCA RATIO:  1.2 ECA:  17 cm/sec LEFT ICA:  Systolic 66 cm/sec, Diastolic 28 cm/sec CCA:  74 cm/sec SYSTOLIC ICA/CCA RATIO:  0.9 ECA:  72 cm/sec Right Brachial SBP: Not acquired Left Brachial SBP: Not acquired RIGHT CAROTID ARTERY: No significant calcified disease of the right common carotid artery. Intermediate waveform maintained. Homogeneous plaque without significant calcifications at the right carotid bifurcation. Low resistance waveform of the  right ICA. No significant tortuosity. RIGHT VERTEBRAL ARTERY: Antegrade flow with low resistance waveform. LEFT CAROTID ARTERY: No significant calcified disease of the left common carotid artery. Intermediate waveform maintained. Homogeneous plaque at the left carotid bifurcation without significant calcifications. Low resistance waveform of the left ICA. LEFT VERTEBRAL ARTERY:  Antegrade flow with low resistance waveform. IMPRESSION: Color duplex indicates minimal homogeneous plaque, with no hemodynamically significant stenosis by duplex criteria in the extracranial cerebrovascular circulation. Signed, Dulcy Fanny. Nadene Rubins, RPVI [facility] Radiology Electronically Signed   By: Corrie Mckusick D.O.   On: 06/01/2022 09:46   CT HEAD WO CONTRAST (5MM)  Result Date: 06/01/2022 CLINICAL DATA:  Syncope/presyncope, cerebrovascular cause suspected syncope vs seizure EXAM: CT HEAD WITHOUT CONTRAST TECHNIQUE: Contiguous axial images were obtained from the base of the skull through the vertex without intravenous contrast. RADIATION DOSE REDUCTION: This exam was performed according to the departmental dose-optimization program which includes automated exposure control, adjustment of the mA and/or kV according to patient size and/or use of iterative reconstruction technique. COMPARISON:  None Available. FINDINGS: Brain: Partially calcified mass within the left posterior fossa along the tentorium at the torcula is unchanged from prior examination, better characterized on prior examination as a partially calcified meningioma. No associated abnormal mass effect. No midline shift. No new intra or extra-axial mass lesion or fluid collection. No evidence of acute intracranial hemorrhage or infarct. Ventricular size is normal. Cerebellum is unremarkable. Vascular: No hyperdense vessel or unexpected calcification. Skull: Normal. Negative for fracture or focal lesion.  Sinuses/Orbits: No acute finding. Other: There is fluid opacification of several inferior mastoid air cells bilaterally, left greater than right. No associated osseous erosion. Middle ear cavities are clear bilaterally. IMPRESSION: 1. No acute intracranial abnormality. No evidence of acute intracranial hemorrhage or infarct. 2. Stable partially calcified left posterior fossa meningioma. 3. Bilateral mastoid effusions, left greater than right. Electronically Signed   By: Fidela Salisbury M.D.   On: 06/01/2022 00:44   DG Chest Portable 1 View  Result Date: 05/31/2022 CLINICAL DATA:  Syncope, hypotension EXAM: PORTABLE CHEST 1 VIEW COMPARISON:  None Available. FINDINGS: Heart and mediastinal contours are within normal limits. No focal opacities or effusions. No acute bony abnormality. IMPRESSION: No active disease. Electronically Signed   By: Rolm Baptise M.D.   On: 05/31/2022 23:13        Scheduled Meds:  buPROPion  150 mg Oral Daily   busPIRone  15 mg Oral  TID   citalopram  40 mg Oral Daily   dicyclomine  20 mg Oral TID AC & HS   diphenoxylate-atropine  2 tablet Oral QID   enoxaparin (LOVENOX) injection  40 mg Subcutaneous Q24H   famotidine  20 mg Oral BID   folic acid  2 mg Oral Daily   hydrocortisone   Rectal BID   pantoprazole  40 mg Oral q morning   potassium chloride  20 mEq Oral Daily   propranolol  20 mg Oral BID   Continuous Infusions:  ferric gluconate (FERRLECIT) IVPB 250 mg (06/02/22 1009)     LOS: 1 day    Time spent: 35 minutes    Mystie Ormand Darleen Crocker, DO [facility]  If 7PM-7AM, please contact night-coverage [email or web address] 06/02/2022, 11:04 AM

## 2022-06-02 NOTE — Evaluation (Signed)
Physical Therapy Evaluation Patient Details Name: Wendy Barber MRN: [id number] DOB: 1961/08/18 Today's Date: 06/02/2022  History of Present Illness  Wendy Barber is a 61 y.o. female with medical history significant of fibromyalgia, hypertension, Dm2, DVT/ PE w IVC, h/o pseudomyxoma peritonei, s/p HIPEC x2 (2004, 2018), posterior meningioma, w hx of recent admission for rectal bleeding/ anemia, and hx of dizziness / vertigo presents with c/o "passing out" this afternoon.  Pt was in the bathroom on the toilet when she felt lightheaded and weak.  Pt called out for husband and he found her unresponsive and shaking her arms and legs.  Pt denies biting her tongue and does not recall any incontinence.  Pt denies dysuria, preceding headache, vision change, focal weakness, cp, palp, sob, n/v.  Pt has chronic diarrhea 6x per day, but denies any increase in diarrhea or any brbpr, since prior admission at [facility]. Pt was scheduled for outpatient MRI brain  (open) because she is claustrophobic, but has not had this yet.   Clinical Impression  Patient demonstrates slightly labored movement with c/o BLE pain during bed mobility, tolerated standing with mild c/o lightheadedness and tolerated BP orthostatics and ambulating to bathroom to urinate without c/o of dizziness, only mild wooziness per patient.  Patient orthostatic BP's after receiving 1 unit of planned 2 units of blood transfusion as follows: lying 128/74, sitting 135/84 and standing 107/71.  Plan:  will attempt vestibular assessment after patient done receiving blood transfusion and determined to be medically stable to return home.  Patient will benefit from continued skilled physical therapy in hospital and recommended venue below to increase strength, balance, endurance for safe ADLs and gait.        Recommendations for follow up therapy are one component of a multi-disciplinary discharge planning process, led by the attending physician.  Recommendations may  be updated based on patient status, additional functional criteria and insurance authorization.  Follow Up Recommendations Home health PT      Assistance Recommended at Discharge PRN  Patient can return home with the following  A little help with walking and/or transfers;A little help with bathing/dressing/bathroom;Help with stairs or ramp for entrance;Assistance with cooking/housework    Equipment Recommendations None recommended by PT  Recommendations for Other Services       Functional Status Assessment Patient has had a recent decline in their functional status and demonstrates the ability to make significant improvements in function in a reasonable and predictable amount of time.     Precautions / Restrictions Precautions Precautions: Fall Restrictions Weight Bearing Restrictions: No      Mobility  Bed Mobility Overal bed mobility: Modified Independent                  Transfers Overall transfer level: Modified independent                      Ambulation/Gait Ambulation/Gait assistance: Min guard Gait Distance (Feet): 15 Feet Assistive device: Rolling walker (2 wheels) Gait Pattern/deviations: Decreased step length - right, Decreased step length - left, Decreased stride length Gait velocity: decreased     General Gait Details: slow labord cadence requiring use of RW due to BLE pain/discomfort  Stairs            Wheelchair Mobility    Modified Rankin (Stroke Patients Only)       Balance Overall balance assessment: Needs assistance Sitting-balance support: Feet supported, No upper extremity supported Sitting balance-Leahy Scale: Good Sitting balance - Comments: seated at  EOB   Standing balance support: During functional activity, Bilateral upper extremity supported Standing balance-Leahy Scale: Fair Standing balance comment: fair/good using RW                             Pertinent Vitals/Pain Pain Assessment Pain  Assessment: Faces Faces Pain Scale: Hurts little more Pain Location: BLE Pain Descriptors / Indicators: Sore, Discomfort, Grimacing Pain Intervention(s): Limited activity within patient's tolerance, Monitored during session, Repositioned    Home Living Family/patient expects to be discharged to:: Private residence Living Arrangements: Spouse/significant other Available Help at Discharge: Family;Available 24 hours/day Type of Home: House Home Access: Stairs to enter Entrance Stairs-Rails: Right Entrance Stairs-Number of Steps: 3   Home Layout: One level Home Equipment: Conservation officer, nature (2 wheels);Rollator (4 wheels);Shower seat;Cane - single point      Prior Function Prior Level of Function : Independent/Modified Independent             Mobility Comments: household and short distanced community ambulator using RW PRN ADLs Comments: assisted by family     Hand Dominance   Dominant Hand: Right    Extremity/Trunk Assessment   Upper Extremity Assessment Upper Extremity Assessment: Overall WFL for tasks assessed    Lower Extremity Assessment Lower Extremity Assessment: Generalized weakness    Cervical / Trunk Assessment Cervical / Trunk Assessment: Normal  Communication   Communication: No difficulties  Cognition Arousal/Alertness: Awake/alert Behavior During Therapy: WFL for tasks assessed/performed Overall Cognitive Status: Within Functional Limits for tasks assessed                                          General Comments      Exercises     Assessment/Plan    PT Assessment Patient needs continued PT services  PT Problem List Decreased strength;Decreased activity tolerance;Decreased balance;Decreased mobility       PT Treatment Interventions DME instruction;Gait training;Stair training;Functional mobility training;Therapeutic activities;Therapeutic exercise;Patient/family education;Balance training;Other (comment) (Vestibular exercises)     PT Goals (Current goals can be found in the Care Plan section)  Acute Rehab PT Goals Patient Stated Goal: return home with family to assist PT Goal Formulation: With patient Time For Goal Achievement: 06/06/22 Potential to Achieve Goals: Good    Frequency Min 3X/week     Co-evaluation               AM-PAC PT "6 Clicks" Mobility  Outcome Measure Help needed turning from your back to your side while in a flat bed without using bedrails?: None Help needed moving from lying on your back to sitting on the side of a flat bed without using bedrails?: None Help needed moving to and from a bed to a chair (including a wheelchair)?: A Little Help needed standing up from a chair using your arms (e.g., wheelchair or bedside chair)?: A Little Help needed to walk in hospital room?: A Little Help needed climbing 3-5 steps with a railing? : A Little 6 Click Score: 20    End of Session   Activity Tolerance: Patient tolerated treatment well;Patient limited by fatigue;Patient limited by pain Patient left: in bed;with call bell/phone within reach Nurse Communication: Mobility status PT Visit Diagnosis: Unsteadiness on feet (R26.81);Other abnormalities of gait and mobility (R26.89);Muscle weakness (generalized) (M62.81)    Time: 1050-1105 PT Time Calculation (min) (ACUTE ONLY): 15 min   Charges:  PT Evaluation $PT Eval Low Complexity: 1 Low PT Treatments $Therapeutic Activity: 8-22 mins        12:02 PM, 06/02/22 Lonell Grandchild, MPT Physical Therapist with [facility] 336 [phone] office [phone] mobile phone

## 2022-06-02 NOTE — Progress Notes (Signed)
Date and time results received: 06/02/22 0645  (use smartphrase ".now" to insert current time)  Test: Hgb Critical Value: 6.9  Name of Provider Notified: Dr. Manuella Ghazi    Orders Received? Or Actions Taken?:  NNO at this time

## 2022-06-02 NOTE — Plan of Care (Signed)
  Problem: Acute Rehab PT Goals(only PT should resolve) Goal: Pt Will Go Supine/Side To Sit Outcome: Progressing Flowsheets (Taken 06/02/2022 1203) Pt will go Supine/Side to Sit:  Independently  with modified independence Goal: Patient Will Transfer Sit To/From Stand Outcome: Progressing Flowsheets (Taken 06/02/2022 1203) Patient will transfer sit to/from stand:  Independently  with modified independence Goal: Pt Will Transfer Bed To Chair/Chair To Bed Outcome: Progressing Flowsheets (Taken 06/02/2022 1203) Pt will Transfer Bed to Chair/Chair to Bed:  Independently  with modified independence Goal: Pt Will Ambulate Outcome: Progressing Flowsheets (Taken 06/02/2022 1203) Pt will Ambulate:  50 feet  with modified independence  with rolling walker   12:04 PM, 06/02/22 Lonell Grandchild, MPT Physical Therapist with [facility] 336 [phone] office [phone] mobile phone

## 2022-06-02 NOTE — TOC Progression Note (Signed)
Transition of Care [facility]) - Progression Note    Patient Details  Name: Wendy Barber MRN: [id number] Date of Birth: June 25, 1961  [facility]) CM/SW Contact  Boneta Lucks, RN Phone Number: 06/02/2022, 12:47 PM  Clinical Narrative:   PT is recommending HHPT,  patient is agreeable. [facility] accepted the referral.   Expected Discharge Plan: Home/Self Care Barriers to Discharge: Continued Medical Work up  Expected Discharge Plan and Services Expected Discharge Plan: Home/Self Care In-house Referral: Clinical Social Work     Living arrangements for the past 2 months: Single Family Home                   Readmission Risk Interventions    06/01/2022    3:59 PM 05/05/2022    8:39 AM  Readmission Risk Prevention Plan  Transportation Screening Complete Complete  PCP or Specialist Appt within 5-7 Days  Complete  Home Care Screening  Complete  Medication Review (RN CM)  Complete  Medication Review Press photographer) Complete   HRI or [place] Complete   SW Recovery Care/Counseling Consult Complete   Palliative Care Screening Not [place] Not Applicable

## 2022-06-03 DIAGNOSIS — R55 Syncope and collapse: Secondary | ICD-10-CM | POA: Diagnosis not present

## 2022-06-03 LAB — CBC
HCT: 25.2 % — ABNORMAL LOW (ref 36.0–46.0)
Hemoglobin: 7.7 g/dL — ABNORMAL LOW (ref 12.0–15.0)
MCH: 27.9 pg (ref 26.0–34.0)
MCHC: 30.6 g/dL (ref 30.0–36.0)
MCV: 91.3 fL (ref 80.0–100.0)
Platelets: 147 10*3/uL — ABNORMAL LOW (ref 150–400)
RBC: 2.76 MIL/uL — ABNORMAL LOW (ref 3.87–5.11)
RDW: 15.7 % — ABNORMAL HIGH (ref 11.5–15.5)
WBC: 10.6 10*3/uL — ABNORMAL HIGH (ref 4.0–10.5)
nRBC: 2.3 % — ABNORMAL HIGH (ref 0.0–0.2)

## 2022-06-03 LAB — BASIC METABOLIC PANEL
Anion gap: 4 — ABNORMAL LOW (ref 5–15)
BUN: 24 mg/dL — ABNORMAL HIGH (ref 6–20)
CO2: 25 mmol/L (ref 22–32)
Calcium: 8.8 mg/dL — ABNORMAL LOW (ref 8.9–10.3)
Chloride: 109 mmol/L (ref 98–111)
Creatinine, Ser: 0.98 mg/dL (ref 0.44–1.00)
GFR, Estimated: >60 mL/min (ref >60)
Glucose, Bld: 143 mg/dL — ABNORMAL HIGH (ref 70–99)
Potassium: 4.1 mmol/L (ref 3.5–5.1)
Sodium: 138 mmol/L (ref 135–145)

## 2022-06-03 LAB — CORTISOL-AM, BLOOD: Cortisol - AM: 12.5 ug/dL (ref 6.7–22.6)

## 2022-06-03 LAB — MAGNESIUM: Magnesium: 1.8 mg/dL (ref 1.7–2.4)

## 2022-06-03 MED ORDER — OXYCODONE HCL 5 MG PO TABS
5.0000 mg | ORAL_TABLET | ORAL | Status: DC | PRN
  Administered 2022-06-03 – 2022-06-04 (×3): 5 mg via ORAL
  Filled 2022-06-03 (×3): qty 1

## 2022-06-03 MED ORDER — FERROUS SULFATE 325 (65 FE) MG PO TABS
325.0000 mg | ORAL_TABLET | Freq: Every day | ORAL | 3 refills | Status: DC
Start: 2022-06-03 — End: 2023-12-04

## 2022-06-03 NOTE — TOC Transition Note (Signed)
Transition of Care [facility]) - CM/SW Discharge Note   Patient Details  Name: Wendy Barber MRN: [id number] Date of Birth: [birth year]  Transition of Care [facility]) CM/SW Contact:  Iona Beard, [place] Phone Number: 06/03/2022, 10:39 AM   Clinical Narrative:    CSW updated that pt will D/C home today. CSW spoke with pt to update that [place] has been set up. CSW updated [name] with [facility] of plan for D/C home today. HH orders have been placed. CSW to add HH to AVS. TOC signing off.   Final next level of care: [place] Barriers to Discharge: Continued Medical Work up   Patient Goals and CMS Choice Patient states their goals for this hospitalization and ongoing recovery are:: go home      Discharge Placement                       Discharge Plan and Services In-house Referral: Clinical Social Work                        HH Arranged: PT [place] Agency: [place] Date [place]: 06/03/22   Representative spoke with at [place]: [place] (SDOH) Interventions     Readmission Risk Interventions    06/01/2022    3:59 PM 05/05/2022    8:39 AM  Readmission Risk Prevention Plan  Transportation Screening Complete Complete  PCP or Specialist Appt within 5-7 Days  Complete  Home Care Screening  Complete  Medication Review (RN CM)  Complete  Medication Review Press photographer) Complete   HRI or [place] Complete   SW Recovery Care/Counseling Consult Complete   [place] Not Applicable

## 2022-06-03 NOTE — Care Management Important Message (Signed)
Important Message  Patient Details  Name: Wendy Barber MRN: [id number] Date of Birth: [birth year]   Medicare Important Message Given:  Yes     Tommy Medal 06/03/2022, 1:05 PM

## 2022-06-03 NOTE — Discharge Summary (Signed)
Physician Discharge Summary  Wendy Barber ERD:[id number] DOB: November 06, 1960 DOA: 05/31/2022  PCP: [name], MD  Admit date: 05/31/2022  Discharge date: 06/04/2022  Admitted From:Home   Disposition:  Home  Recommendations for Outpatient Follow-up:  Follow up with PCP in 1-2 weeks Recommend outpatient vestibular assessment if ongoing spinning/dizziness noted, but this has resolved during the stay Recommend adding back some sodium to the diet Hold further use of Diovan/HCTZ until further follow-up with blood pressure readings, but can continue propranolol Start ferrous sulfate as ordered Continue other home medications as ordered previously  Home Health: Yes with PT  Equipment/Devices: Has home rolling walker  Discharge Condition:Stable  CODE STATUS: Full  Diet recommendation: Heart Healthy/carb modified  Brief/Interim Summary: Wendy Barber is a 61 y.o. female with medical history significant of fibromyalgia, hypertension, Dm2, DVT/ PE w IVC, h/o pseudomyxoma peritonei, s/p HIPEC x2 (2004, 2018), posterior meningioma, w hx of recent admission for rectal bleeding/ anemia, and hx of dizziness / vertigo presents with c/o "passing out".  She was admitted with concern for syncope versus seizure.  2D echocardiogram and carotid ultrasound are unremarkable and EEG was also unremarkable.  She was noted to have some mild orthostasis which appears to have resolved with IV fluid and 1 unit PRBC administration for acute anemia which developed during the course of the stay.  She did not have any overt bleeding and stool occult testing was negative.  She was noted to be significantly iron deficient and received some IV Feraheme.  She will be started on oral iron supplementation outpatient.  No other acute events or concerns noted throughout the course of the stay and she is stable for discharge.  Discharge Diagnoses:  Principal Problem:   Syncope Active Problems:   HTN (hypertension)   DM2  (diabetes mellitus, type 2) (HCC)   Iron deficiency anemia due to chronic blood loss   History of pulmonary embolus (PE)   Hypotension  Principal discharge diagnosis: Syncope likely secondary to orthostasis with associated AKI.  Acute iron deficiency anemia status post 1 unit PRBC transfusion.  Discharge Instructions  Discharge Instructions     Diet - low sodium heart healthy   Complete by: As directed    Increase activity slowly   Complete by: As directed       Allergies as of 06/04/2022       Reactions   Penicillins Shortness Of Breath   Other reaction(s): Irregular Heart Rate, Other (See Comments) Rapid heartrate   Alprazolam Hives, Other (See Comments)   Hard to arouse, unresponsiveness   Ativan [lorazepam] Other (See Comments)   Note: tolerates midazolam fine Face & Throat Swelling.   Corticosteroids Other (See Comments)   Other reaction(s): Other (see comments) Psychotic behaviour   Erythromycin    Other reaction(s): Other (See Comments) Severe stomach pain   Prednisone Other (See Comments)   Anxiety & Nervous Breakdown.   Savella  [milnacipran]    Prednisolone Anxiety        Medication List     STOP taking these medications    valsartan-hydrochlorothiazide 160-12.5 MG tablet Commonly known as: DIOVAN-HCT       TAKE these medications    alum & mag hydroxide-simeth 200-200-20 MG/5ML suspension Commonly known as: MAALOX/MYLANTA Take 15 mLs by mouth every 6 (six) hours as needed for indigestion or heartburn.   buPROPion 150 MG 24 hr tablet Commonly known as: Wellbutrin XL Take 1 tablet (150 mg total) by mouth daily.   busPIRone 15 MG tablet Commonly  known as: BUSPAR Take 1 tablet (15 mg total) by mouth 3 (three) times daily.   citalopram 40 MG tablet Commonly known as: CeleXA Take 1 tablet (40 mg total) by mouth daily.   dicyclomine 20 MG tablet Commonly known as: BENTYL Take 1 tablet (20 mg total) by mouth in the morning, at noon, in the  evening, and at bedtime.   diphenhydrAMINE 25 MG tablet Commonly known as: SOMINEX Take 25 mg by mouth daily as needed for itching.   diphenoxylate-atropine 2.5-0.025 MG tablet Commonly known as: LOMOTIL TAKE 2 TABLETS IN THE MORNING, AT NOON, IN THE EVENING AND AT BEDTIME What changed: See the new instructions.   eszopiclone 2 MG Tabs tablet Commonly known as: Lunesta Take 1 tablet (2 mg total) by mouth at bedtime as needed for sleep. Take immediately before bedtime   famotidine 20 MG tablet Commonly known as: PEPCID TAKE 2 TABLETS TWICE A DAY   ferrous sulfate 325 (65 FE) MG tablet Take 1 tablet (325 mg total) by mouth daily.   folic acid 1 MG tablet Commonly known as: FOLVITE Take 2 tablets (2 mg total) by mouth daily.   hydrocortisone 2.5 % rectal cream Commonly known as: ANUSOL-HC Place rectally 2 (two) times daily.   loperamide 2 MG capsule Commonly known as: IMODIUM Take 2 capsules (4 mg total) by mouth 4 (four) times daily as needed for diarrhea or loose stools.   meclizine 25 MG tablet Commonly known as: ANTIVERT Take 1 tablet (25 mg total) by mouth 3 (three) times daily as needed for dizziness.   ondansetron 8 MG tablet Commonly known as: ZOFRAN Take 8 mg by mouth every 8 (eight) hours as needed for nausea.   orphenadrine 100 MG tablet Commonly known as: NORFLEX Take 1 tablet (100 mg total) by mouth at bedtime as needed for muscle spasms.   oxyCODONE 5 MG immediate release tablet Commonly known as: Roxicodone Take 1 tablet (5 mg total) by mouth every 4 (four) hours as needed for severe pain or breakthrough pain.   pantoprazole 40 MG tablet Commonly known as: PROTONIX Take 40 mg by mouth every morning.   potassium chloride 10 MEQ tablet Commonly known as: KLOR-CON M Take 2 tablets (20 mEq total) by mouth daily.   promethazine 12.5 MG tablet Commonly known as: PHENERGAN Take 1 tablet (12.5 mg total) by mouth 2 (two) times daily as needed. What  changed: reasons to take this   propranolol 20 MG tablet Commonly known as: INDERAL Take 1 tablet (20 mg total) by mouth 2 (two) times daily.   traMADol 50 MG tablet Commonly known as: ULTRAM Take 1 tablet (50 mg total) by mouth every 6 (six) hours as needed for moderate pain.   Xarelto 20 MG Tabs tablet Generic drug: rivaroxaban Take 20 mg by mouth daily.        Follow-up Information     [facility] Follow up.   Why: PT will call to scheudule your first home visit.        [name], MD. Schedule an appointment as soon as possible for a visit in 1 week(s).   Specialties: Family Medicine, Sports Medicine Contact information: [address] [place] [address] [phone]                Allergies  Allergen Reactions   Penicillins Shortness Of Breath    Other reaction(s): Irregular Heart Rate, Other (See Comments) Rapid heartrate    Alprazolam Hives  and Other (See Comments)    Hard to arouse, unresponsiveness   Ativan [Lorazepam] Other (See Comments)    Note: tolerates midazolam fine Face & Throat Swelling.   Corticosteroids Other (See Comments)    Other reaction(s): Other (see comments) Psychotic behaviour     Erythromycin     Other reaction(s): Other (See Comments) Severe stomach pain    Prednisone Other (See Comments)    Anxiety & Nervous Breakdown.   Graham  [Milnacipran]    Prednisolone Anxiety    Consultations: None   Procedures/Studies: EEG adult  Result Date: Jun 03, 2022 Lora Havens, MD     06/02/2022  8:23 AM Patient Name: Alicen Donalson MRN: [id number] Epilepsy Attending: Lora Havens Referring Physician/Provider: Jani Gravel, MD Date: 06-03-2022 Duration: 22.09 mins Patient history: 61 year old female with syncope.  EEG to evaluate for seizure. Level of alertness: Awake AEDs during EEG study: None Technical aspects: This EEG study was done with scalp electrodes positioned according to the 10-20  International system of electrode placement. Electrical activity was reviewed with band pass filter of 1-'70Hz'$ , sensitivity of 7 uV/mm, display speed of 63m/sec with a '60Hz'$  notched filter applied as appropriate. EEG data were recorded continuously and digitally stored.  Video monitoring was available and reviewed as appropriate. Description: The posterior dominant rhythm consists of 8 Hz activity of moderate voltage (25-35 uV) seen predominantly in posterior head regions, symmetric and reactive to eye opening and eye closing. Hyperventilation and photic stimulation were not performed.   IMPRESSION: This study is within normal limits. No seizures or epileptiform discharges were seen throughout the recording. A normal interictal EEG does not exclude nor support the diagnosis of epilepsy. PLora Havens  ECHOCARDIOGRAM COMPLETE  Result Date: 808/11/23   ECHOCARDIOGRAM REPORT   Patient Name:   RARIN VANOSDOLDate of Exam: 82023-08-11Medical Rec #:  [id number]     Height:       66.0 in Accession #:    [id number]    Weight:       238.5 lb Date of Birth:  [birth year]     BSA:          2.155 m Patient Age:    89years       BP:           116/79 mmHg Patient Gender: F              HR:           85 bpm. Exam Location:  [facility]Procedure: 2D Echo, Cardiac Doppler and Color Doppler Indications:    R55 Syncope  History:        Patient has no prior history of Echocardiogram examinations.                 Risk Factors:Hypertension and Diabetes. 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  Sonographer:    BAlvino ChapelRCS Referring Phys: [id number] 1. Left ventricular ejection fraction, by estimation, is 60 to 65%. The left ventricle has normal function. The left ventricle has no regional wall motion abnormalities. Left ventricular diastolic parameters were normal.  2. Right ventricular systolic function is normal. The right ventricular size is normal. Tricuspid  regurgitation signal is inadequate for assessing PA pressure.  3. A small pericardial effusion is present. The pericardial effusion is circumferential. There is no evidence of  cardiac tamponade.  4. The mitral valve is grossly normal. No evidence of mitral valve regurgitation. No evidence of mitral stenosis.  5. The aortic valve is tricuspid. Aortic valve regurgitation is not visualized. No aortic stenosis is present.  6. The inferior vena cava is normal in size with greater than 50% respiratory variability, suggesting right atrial pressure of 3 mmHg. FINDINGS  Left Ventricle: Left ventricular ejection fraction, by estimation, is 60 to 65%. The left ventricle has normal function. The left ventricle has no regional wall motion abnormalities. The left ventricular internal cavity size was normal in size. There is  no left ventricular hypertrophy. Left ventricular diastolic parameters were normal. Right Ventricle: The right ventricular size is normal. No increase in right ventricular wall thickness. Right ventricular systolic function is normal. Tricuspid regurgitation signal is inadequate for assessing PA pressure. Left Atrium: Left atrial size was normal in size. Right Atrium: Right atrial size was normal in size. Pericardium: A small pericardial effusion is present. The pericardial effusion is circumferential. There is no evidence of cardiac tamponade. Mitral Valve: The mitral valve is grossly normal. No evidence of mitral valve regurgitation. No evidence of mitral valve stenosis. Tricuspid Valve: The tricuspid valve is grossly normal. Tricuspid valve regurgitation is not demonstrated. No evidence of tricuspid stenosis. Aortic Valve: The aortic valve is tricuspid. Aortic valve regurgitation is not visualized. No aortic stenosis is present. Pulmonic Valve: The pulmonic valve was grossly normal. Pulmonic valve regurgitation is trivial. No evidence of pulmonic stenosis. Aorta: The aortic root is normal in size and  structure. Venous: The inferior vena cava is normal in size with greater than 50% respiratory variability, suggesting right atrial pressure of 3 mmHg. IAS/Shunts: The atrial septum is grossly normal.  LEFT VENTRICLE PLAX 2D LVIDd:         4.60 cm   Diastology LVIDs:         2.70 cm   LV e' medial:    5.87 cm/s LV PW:         1.00 cm   LV E/e' medial:  9.0 LV IVS:        1.00 cm   LV e' lateral:   9.79 cm/s LVOT diam:     2.40 cm   LV E/e' lateral: 5.4 LV SV:         67 LV SV Index:   31 LVOT Area:     4.52 cm  RIGHT VENTRICLE RV S prime:     14.80 cm/s TAPSE (M-mode): 2.0 cm LEFT ATRIUM             Index        RIGHT ATRIUM           Index LA diam:        3.70 cm 1.72 cm/m   RA Area:     14.90 cm LA Vol (A2C):   49.8 ml 23.11 ml/m  RA Volume:   38.00 ml  17.63 ml/m LA Vol (A4C):   54.0 ml 25.05 ml/m LA Biplane Vol: 51.9 ml 24.08 ml/m  AORTIC VALVE LVOT Vmax:   82.00 cm/s LVOT Vmean:  55.800 cm/s LVOT VTI:    0.149 m  AORTA Ao Root diam: 3.40 cm MITRAL VALVE MV Area (PHT): 4.49 cm    SHUNTS MV Decel Time: 169 msec    Systemic VTI:  0.15 m MV E velocity: 52.90 cm/s  Systemic Diam: 2.40 cm MV A velocity: 65.50 cm/s MV E/A ratio:  0.81 Eleonore Chiquito MD Electronically signed  by Eleonore Chiquito MD Signature Date/Time: 06/01/2022/10:45:58 AM    Final    US Carotid Bilateral  Result Date: 06/01/2022 CLINICAL DATA:  61 year old female with a history of syncope EXAM: BILATERAL CAROTID DUPLEX ULTRASOUND TECHNIQUE: Pearline Cables scale imaging, color Doppler and duplex ultrasound were performed of bilateral carotid and vertebral arteries in the neck. COMPARISON:  None Available. FINDINGS: Criteria: Quantification of carotid stenosis is based on velocity parameters that correlate the residual internal carotid diameter with NASCET-based stenosis levels, using the diameter of the distal internal carotid lumen as the denominator for stenosis measurement. The following velocity measurements were obtained: RIGHT ICA:  Systolic 77  cm/sec, Diastolic 23 cm/sec CCA:  65 cm/sec SYSTOLIC ICA/CCA RATIO:  1.2 ECA:  17 cm/sec LEFT ICA:  Systolic 66 cm/sec, Diastolic 28 cm/sec CCA:  74 cm/sec SYSTOLIC ICA/CCA RATIO:  0.9 ECA:  72 cm/sec Right Brachial SBP: Not acquired Left Brachial SBP: Not acquired RIGHT CAROTID ARTERY: No significant calcified disease of the right common carotid artery. Intermediate waveform maintained. Homogeneous plaque without significant calcifications at the right carotid bifurcation. Low resistance waveform of the right ICA. No significant tortuosity. RIGHT VERTEBRAL ARTERY: Antegrade flow with low resistance waveform. LEFT CAROTID ARTERY: No significant calcified disease of the left common carotid artery. Intermediate waveform maintained. Homogeneous plaque at the left carotid bifurcation without significant calcifications. Low resistance waveform of the left ICA. LEFT VERTEBRAL ARTERY:  Antegrade flow with low resistance waveform. IMPRESSION: Color duplex indicates minimal homogeneous plaque, with no hemodynamically significant stenosis by duplex criteria in the extracranial cerebrovascular circulation. Signed, Dulcy Fanny. Nadene Rubins, RPVI [facility] Electronically Signed   By: Corrie Mckusick D.O.   On: 06/01/2022 09:46   CT HEAD WO CONTRAST (5MM)  Result Date: 06/01/2022 CLINICAL DATA:  Syncope/presyncope, cerebrovascular cause suspected syncope vs seizure EXAM: CT HEAD WITHOUT CONTRAST TECHNIQUE: Contiguous axial images were obtained from the base of the skull through the vertex without intravenous contrast. RADIATION DOSE REDUCTION: This exam was performed according to the departmental dose-optimization program which includes automated exposure control, adjustment of the mA and/or kV according to patient size and/or use of iterative reconstruction technique. COMPARISON:  None Available. FINDINGS: Brain: Partially calcified mass within the left posterior  fossa along the tentorium at the torcula is unchanged from prior examination, better characterized on prior examination as a partially calcified meningioma. No associated abnormal mass effect. No midline shift. No new intra or extra-axial mass lesion or fluid collection. No evidence of acute intracranial hemorrhage or infarct. Ventricular size is normal. Cerebellum is unremarkable. Vascular: No hyperdense vessel or unexpected calcification. Skull: Normal. Negative for fracture or focal lesion. Sinuses/Orbits: No acute finding. Other: There is fluid opacification of several inferior mastoid air cells bilaterally, left greater than right. No associated osseous erosion. Middle ear cavities are clear bilaterally. IMPRESSION: 1. No acute intracranial abnormality. No evidence of acute intracranial hemorrhage or infarct. 2. Stable partially calcified left posterior fossa meningioma. 3. Bilateral mastoid effusions, left greater than right. Electronically Signed   By: Fidela Salisbury M.D.   On: 06/01/2022 00:44   DG Chest Portable 1 View  Result Date: 05/31/2022 CLINICAL DATA:  Syncope, hypotension EXAM: PORTABLE CHEST 1 VIEW COMPARISON:  None Available. FINDINGS: Heart and mediastinal contours are within normal limits. No focal opacities or effusions. No acute bony abnormality. IMPRESSION: No active disease. Electronically Signed   By: Rolm Baptise M.D.   On: 05/31/2022 23:13     Discharge Exam: Vitals:  06/03/22 2002 06/04/22 0340  BP: 138/88 122/63  Pulse: 63 62  Resp: 14 14  Temp: 98.2 F (36.8 C) 98.3 F (36.8 C)  SpO2: 100% 93%   Vitals:   06/03/22 0651 06/03/22 2002 06/04/22 0340 06/04/22 0705  BP:  138/88 122/63   Pulse:  63 62   Resp:  14 14   Temp:  98.2 F (36.8 C) 98.3 F (36.8 C)   TempSrc:      SpO2:  100% 93%   Weight: 111.1 kg   109.6 kg  Height:        General: Pt is alert, awake, not in acute distress Cardiovascular: RRR, S1/S2 +, no rubs, no gallops Respiratory: CTA  bilaterally, no wheezing, no rhonchi Abdominal: Soft, NT, ND, bowel sounds + Extremities: no edema, no cyanosis    The results of significant diagnostics from this hospitalization (including imaging, microbiology, ancillary and laboratory) are listed below for reference.     Microbiology: Recent Results (from the past 240 hour(s))  Blood culture (routine x 2)     Status: None (Preliminary result)   Collection Time: 05/31/22  5:24 PM   Specimen: BLOOD LEFT ARM  Result Value Ref Range Status   Specimen Description BLOOD LEFT ARM  Final   Special Requests   Final    BOTTLES DRAWN AEROBIC ONLY Blood Culture adequate volume   Culture   Final    NO GROWTH 4 DAYS Performed at [facility], [address], [place], [place] [address]    Report Status PENDING  Incomplete  Blood culture (routine x 2)     Status: None (Preliminary result)   Collection Time: 05/31/22  7:45 PM   Specimen: Left Antecubital; Blood  Result Value Ref Range Status   Specimen Description LEFT ANTECUBITAL  Final   Special Requests   Final    BOTTLES DRAWN AEROBIC AND ANAEROBIC Blood Culture adequate volume   Culture   Final    NO GROWTH 4 DAYS Performed at [facility], [address] [place], [place] [address]    Report Status PENDING  Incomplete     Labs: BNP (last 3 results) No results for input(s): "BNP" in the last 8760 hours. Basic Metabolic Panel: Recent Labs  Lab 05/31/22 1945 06/01/22 0520 06/02/22 0517 06/03/22 0631 06/04/22 0601  NA 137 136 137 138 140  K 4.4 4.2 3.7 4.1 3.8  CL 104 106 109 109 109  CO2 23 21* 21* 25 26  GLUCOSE 160* 147* 145* 143* 99  BUN 24* 27* 27* 24* 20  CREATININE 1.41* 1.32* 1.30* 0.98 0.83  CALCIUM 8.7* 8.6* 8.5* 8.8* 8.9  MG  --   --  1.8 1.8 1.7   Liver Function Tests: Recent Labs  Lab 05/30/22 1257 05/31/22 1945 06/01/22 0520  AST '20 20 19  '$ ALT '14 16 16  '$ ALKPHOS 146* 141* 140*  BILITOT 0.3 0.5 0.7  PROT 7.7 7.5 7.4  ALBUMIN 4.0 3.5 3.4*   No  results for input(s): "LIPASE", "AMYLASE" in the last 168 hours. No results for input(s): "AMMONIA" in the last 168 hours. CBC: Recent Labs  Lab 05/31/22 1945 06/01/22 0520 06/02/22 0517 06/02/22 1244 06/03/22 0631 06/04/22 0601  WBC 12.1* 11.5* 10.2  --  10.6* 10.0  NEUTROABS 8.4*  --   --   --   --   --   HGB 8.3* 7.7* 6.9* 8.3* 7.7* 7.8*  HCT 27.0* 24.7* 22.8* 27.1* 25.2* 25.2*  MCV 91.2 90.1 90.8  --  91.3 90.6  PLT 238 181 154  --  147* 155   Cardiac Enzymes: No results for input(s): "CKTOTAL", "CKMB", "CKMBINDEX", "TROPONINI" in the last 168 hours. BNP: Invalid input(s): "POCBNP" CBG: No results for input(s): "GLUCAP" in the last 168 hours. D-Dimer No results for input(s): "DDIMER" in the last 72 hours. Hgb A1c No results for input(s): "HGBA1C" in the last 72 hours.  Lipid Profile No results for input(s): "CHOL", "HDL", "LDLCALC", "TRIG", "CHOLHDL", "LDLDIRECT" in the last 72 hours. Thyroid function studies No results for input(s): "TSH", "T4TOTAL", "T3FREE", "THYROIDAB" in the last 72 hours.  Invalid input(s): "FREET3" Anemia work up No results for input(s): "VITAMINB12", "FOLATE", "FERRITIN", "TIBC", "IRON", "RETICCTPCT" in the last 72 hours.  Urinalysis    Component Value Date/Time   COLORURINE YELLOW 06/01/2022 0700   APPEARANCEUR HAZY (A) 06/01/2022 0700   LABSPEC 1.026 06/01/2022 0700   PHURINE 5.0 06/01/2022 0700   GLUCOSEU NEGATIVE 06/01/2022 0700   GLUCOSEU NEGATIVE 11/29/2021 1517   HGBUR NEGATIVE 06/01/2022 0700   BILIRUBINUR NEGATIVE 06/01/2022 0700   KETONESUR NEGATIVE 06/01/2022 0700   PROTEINUR 30 (A) 06/01/2022 0700   UROBILINOGEN 0.2 11/29/2021 1517   NITRITE NEGATIVE 06/01/2022 0700   LEUKOCYTESUR MODERATE (A) 06/01/2022 0700   Sepsis Labs Recent Labs  Lab 06/01/22 0520 06/02/22 0517 06/03/22 0631 06/04/22 0601  WBC 11.5* 10.2 10.6* 10.0   Microbiology Recent Results (from the past 240 hour(s))  Blood culture (routine x 2)      Status: None (Preliminary result)   Collection Time: 05/31/22  5:24 PM   Specimen: BLOOD LEFT ARM  Result Value Ref Range Status   Specimen Description BLOOD LEFT ARM  Final   Special Requests   Final    BOTTLES DRAWN AEROBIC ONLY Blood Culture adequate volume   Culture   Final    NO GROWTH 4 DAYS Performed at [facility], [address] [place], [place] [address]    Report Status PENDING  Incomplete  Blood culture (routine x 2)     Status: None (Preliminary result)   Collection Time: 05/31/22  7:45 PM   Specimen: Left Antecubital; Blood  Result Value Ref Range Status   Specimen Description LEFT ANTECUBITAL  Final   Special Requests   Final    BOTTLES DRAWN AEROBIC AND ANAEROBIC Blood Culture adequate volume   Culture   Final    NO GROWTH 4 DAYS Performed at [facility], [address] [place], [place] [address]    Report Status PENDING  Incomplete     Time coordinating discharge: 35 minutes  SIGNED:   Rodena Goldmann, DO [facility] 06/04/2022, 9:42 AM  If 7PM-7AM, please contact night-coverage [email or web address]

## 2022-06-03 NOTE — Progress Notes (Signed)
Physical Therapy Treatment Patient Details Name: Wendy Barber MRN: [id number] DOB: 1961-04-09 Today's Date: 06/03/2022   History of Present Illness Wendy Barber is a 61 y.o. female with medical history significant of fibromyalgia, hypertension, Dm2, DVT/ PE w IVC, h/o pseudomyxoma peritonei, s/p HIPEC x2 (2004, 2018), posterior meningioma, w hx of recent admission for rectal bleeding/ anemia, and hx of dizziness / vertigo presents with c/o "passing out" this afternoon.  Pt was in the bathroom on the toilet when she felt lightheaded and weak.  Pt called out for husband and he found her unresponsive and shaking her arms and legs.  Pt denies biting her tongue and does not recall any incontinence.  Pt denies dysuria, preceding headache, vision change, focal weakness, cp, palp, sob, n/v.  Pt has chronic diarrhea 6x per day, but denies any increase in diarrhea or any brbpr, since prior admission at [facility]. Pt was scheduled for outpatient MRI brain  (open) because she is claustrophobic, but has not had this yet.    PT Comments    Patient demonstrates good return for bed mobility, transfers and ambulation in room/hallway using RW without loss of balance or c/o symptoms of dizziness/lightheadedness.  Patient had no c/o with right sided Hallpike-Dix maneuver, c/o spinning sensation with left sided Hallpike-Dix maneuver that resolved after 1-2 minutes, but no nystagmus noted. Plan: recommend follow up Outpatient Vestibular assessment ordered by PCP if patient continues to complain of spinning sensation that is not related to BP issues.  Patient will benefit from continued skilled physical therapy in hospital and recommended venue below to increase strength, balance, endurance for safe ADLs and gait.     Recommendations for follow up therapy are one component of a multi-disciplinary discharge planning process, led by the attending physician.  Recommendations may be updated based on patient status, additional  functional criteria and insurance authorization.  Follow Up Recommendations  Home health PT     Assistance Recommended at Discharge PRN  Patient can return home with the following A little help with walking and/or transfers;A little help with bathing/dressing/bathroom;Help with stairs or ramp for entrance;Assistance with cooking/housework   Equipment Recommendations  None recommended by PT    Recommendations for Other Services       Precautions / Restrictions Precautions Precautions: Fall Restrictions Weight Bearing Restrictions: No     Mobility  Bed Mobility Overal bed mobility: Modified Independent                  Transfers Overall transfer level: Modified independent Equipment used: Rolling walker (2 wheels)                    Ambulation/Gait Ambulation/Gait assistance: Modified independent (Device/Increase time), Supervision Gait Distance (Feet): 55 Feet Assistive device: Rolling walker (2 wheels) Gait Pattern/deviations: Decreased step length - right, Decreased step length - left, Decreased stride length Gait velocity: decreased     General Gait Details: slow slightly labored cadence without loss of balance or c/o dizziness, limited mostly due to c/o increasing pain in hips and legs   Stairs             Wheelchair Mobility    Modified Rankin (Stroke Patients Only)       Balance Overall balance assessment: Needs assistance Sitting-balance support: Feet supported, No upper extremity supported Sitting balance-Leahy Scale: Good Sitting balance - Comments: seated at EOB   Standing balance support: During functional activity, Bilateral upper extremity supported Standing balance-Leahy Scale: Fair Standing balance comment: fair/good using RW  Cognition Arousal/Alertness: Awake/alert Behavior During Therapy: WFL for tasks assessed/performed Overall Cognitive Status: Within Functional Limits for  tasks assessed                                          Exercises General Exercises - Lower Extremity Long Arc Quad: Seated, AROM, Strengthening, Both, 10 reps Hip Flexion/Marching: Seated, AROM, Strengthening, Both, 10 reps Toe Raises: Seated, AROM, Strengthening, Both, 15 reps Heel Raises: Seated, AROM, Strengthening, Both, 15 reps    General Comments        Pertinent Vitals/Pain Pain Assessment Pain Assessment: 0-10 Pain Score: 7  Pain Location: bilateral hips and legs Pain Descriptors / Indicators: Sore, Discomfort Pain Intervention(s): Limited activity within patient's tolerance, Monitored during session, Premedicated before session, Patient requesting pain meds-RN notified    Home Living                          Prior Function            PT Goals (current goals can now be found in the care plan section) Acute Rehab PT Goals Patient Stated Goal: return home with family to assist PT Goal Formulation: With patient Time For Goal Achievement: 06/06/22 Potential to Achieve Goals: Good Progress towards PT goals: Progressing toward goals    Frequency    Min 3X/week      PT Plan Current plan remains appropriate    Co-evaluation              AM-PAC PT "6 Clicks" Mobility   Outcome Measure  Help needed turning from your back to your side while in a flat bed without using bedrails?: None Help needed moving from lying on your back to sitting on the side of a flat bed without using bedrails?: None Help needed moving to and from a bed to a chair (including a wheelchair)?: None Help needed standing up from a chair using your arms (e.g., wheelchair or bedside chair)?: None Help needed to walk in hospital room?: A Little Help needed climbing 3-5 steps with a railing? : A Little 6 Click Score: 22    End of Session   Activity Tolerance: Patient tolerated treatment well;Patient limited by fatigue;Patient limited by pain Patient left:  in bed;with call bell/phone within reach Nurse Communication: Mobility status PT Visit Diagnosis: Unsteadiness on feet (R26.81);Other abnormalities of gait and mobility (R26.89);Muscle weakness (generalized) (M62.81)     Time: 6195-0932 PT Time Calculation (min) (ACUTE ONLY): 33 min  Charges:  $Gait Training: 8-22 mins $Therapeutic Exercise: 8-22 mins                     10:20 AM, 06/03/22 Lonell Grandchild, MPT Physical Therapist with [facility] 336 [phone] office [phone] mobile phone

## 2022-06-03 NOTE — Progress Notes (Deleted)
Ng Discharge Note  Admit Date:  05/31/2022 Discharge date: 06/03/2022   Nelsy Madonna to be D/C'd Home per MD order.  AVS completed. Patient/caregiver able to verbalize understanding.  Discharge Medication: Allergies as of 06/03/2022       Reactions   Penicillins Shortness Of Breath   Other reaction(s): Irregular Heart Rate, Other (See Comments) Rapid heartrate   Alprazolam Hives, Other (See Comments)   Hard to arouse, unresponsiveness   Ativan [lorazepam] Other (See Comments)   Note: tolerates midazolam fine Face & Throat Swelling.   Corticosteroids Other (See Comments)   Other reaction(s): Other (see comments) Psychotic behaviour   Erythromycin    Other reaction(s): Other (See Comments) Severe stomach pain   Prednisone Other (See Comments)   Anxiety & Nervous Breakdown.   Savella  [milnacipran]    Prednisolone Anxiety        Medication List     STOP taking these medications    valsartan-hydrochlorothiazide 160-12.5 MG tablet Commonly known as: DIOVAN-HCT       TAKE these medications    alum & mag hydroxide-simeth 200-200-20 MG/5ML suspension Commonly known as: MAALOX/MYLANTA Take 15 mLs by mouth every 6 (six) hours as needed for indigestion or heartburn.   buPROPion 150 MG 24 hr tablet Commonly known as: Wellbutrin XL Take 1 tablet (150 mg total) by mouth daily.   busPIRone 15 MG tablet Commonly known as: BUSPAR Take 1 tablet (15 mg total) by mouth 3 (three) times daily.   citalopram 40 MG tablet Commonly known as: CeleXA Take 1 tablet (40 mg total) by mouth daily.   dicyclomine 20 MG tablet Commonly known as: BENTYL Take 1 tablet (20 mg total) by mouth in the morning, at noon, in the evening, and at bedtime.   diphenhydrAMINE 25 MG tablet Commonly known as: SOMINEX Take 25 mg by mouth daily as needed for itching.   diphenoxylate-atropine 2.5-0.025 MG tablet Commonly known as: LOMOTIL TAKE 2 TABLETS IN THE MORNING, AT NOON, IN THE EVENING AND AT  BEDTIME What changed: See the new instructions.   eszopiclone 2 MG Tabs tablet Commonly known as: Lunesta Take 1 tablet (2 mg total) by mouth at bedtime as needed for sleep. Take immediately before bedtime   famotidine 20 MG tablet Commonly known as: PEPCID TAKE 2 TABLETS TWICE A DAY   ferrous sulfate 325 (65 FE) MG tablet Take 1 tablet (325 mg total) by mouth daily.   folic acid 1 MG tablet Commonly known as: FOLVITE Take 2 tablets (2 mg total) by mouth daily.   hydrocortisone 2.5 % rectal cream Commonly known as: ANUSOL-HC Place rectally 2 (two) times daily.   loperamide 2 MG capsule Commonly known as: IMODIUM Take 2 capsules (4 mg total) by mouth 4 (four) times daily as needed for diarrhea or loose stools.   meclizine 25 MG tablet Commonly known as: ANTIVERT Take 1 tablet (25 mg total) by mouth 3 (three) times daily as needed for dizziness.   ondansetron 8 MG tablet Commonly known as: ZOFRAN Take 8 mg by mouth every 8 (eight) hours as needed for nausea.   orphenadrine 100 MG tablet Commonly known as: NORFLEX Take 1 tablet (100 mg total) by mouth at bedtime as needed for muscle spasms.   pantoprazole 40 MG tablet Commonly known as: PROTONIX Take 40 mg by mouth every morning.   potassium chloride 10 MEQ tablet Commonly known as: KLOR-CON M Take 2 tablets (20 mEq total) by mouth daily.   promethazine 12.5 MG tablet Commonly  known as: PHENERGAN Take 1 tablet (12.5 mg total) by mouth 2 (two) times daily as needed. What changed: reasons to take this   propranolol 20 MG tablet Commonly known as: INDERAL Take 1 tablet (20 mg total) by mouth 2 (two) times daily.   traMADol 50 MG tablet Commonly known as: ULTRAM Take 1 tablet (50 mg total) by mouth every 6 (six) hours as needed for moderate pain.   Xarelto 20 MG Tabs tablet Generic drug: rivaroxaban Take 20 mg by mouth daily.        Discharge Assessment: Vitals:   06/02/22 2008 06/03/22 0410  BP: 118/75  (!) 157/86  Pulse: 86 67  Resp: 18 18  Temp: 98.9 F (37.2 C) 98.3 F (36.8 C)  SpO2: 97% 97%   Skin clean, dry and intact without evidence of skin break down, no evidence of skin tears noted. IV catheter discontinued intact. Site without signs and symptoms of complications - no redness or edema noted at insertion site, patient denies c/o pain - only slight tenderness at site.  Dressing with slight pressure applied.  D/c Instructions-Education: Discharge instructions given to patient/family with verbalized understanding. D/c education completed with patient/family including follow up instructions, medication list, d/c activities limitations if indicated, with other d/c instructions as indicated by MD - patient able to verbalize understanding, all questions fully answered. Patient instructed to return to ED, call 911, or call MD for any changes in condition.  Patient escorted via [place], and D/C home via private auto.  [name], LPN 2/95/2841 32:44 AM

## 2022-06-04 LAB — BASIC METABOLIC PANEL
Anion gap: 5 (ref 5–15)
BUN: 20 mg/dL (ref 6–20)
CO2: 26 mmol/L (ref 22–32)
Calcium: 8.9 mg/dL (ref 8.9–10.3)
Chloride: 109 mmol/L (ref 98–111)
Creatinine, Ser: 0.83 mg/dL (ref 0.44–1.00)
GFR, Estimated: >60 mL/min (ref >60)
Glucose, Bld: 99 mg/dL (ref 70–99)
Potassium: 3.8 mmol/L (ref 3.5–5.1)
Sodium: 140 mmol/L (ref 135–145)

## 2022-06-04 LAB — TYPE AND SCREEN
ABO/RH(D): A POS
Antibody Screen: POSITIVE
Unit division: 0
Unit division: 0

## 2022-06-04 LAB — CBC
HCT: 25.2 % — ABNORMAL LOW (ref 36.0–46.0)
Hemoglobin: 7.8 g/dL — ABNORMAL LOW (ref 12.0–15.0)
MCH: 28.1 pg (ref 26.0–34.0)
MCHC: 31 g/dL (ref 30.0–36.0)
MCV: 90.6 fL (ref 80.0–100.0)
Platelets: 155 10*3/uL (ref 150–400)
RBC: 2.78 MIL/uL — ABNORMAL LOW (ref 3.87–5.11)
RDW: 15.6 % — ABNORMAL HIGH (ref 11.5–15.5)
WBC: 10 10*3/uL (ref 4.0–10.5)
nRBC: 3.9 % — ABNORMAL HIGH (ref 0.0–0.2)

## 2022-06-04 LAB — BPAM RBC
Blood Product Expiration Date: 202309012359
Blood Product Expiration Date: 202309012359
ISSUE DATE / TIME: 202308100800
ISSUE DATE / TIME: 202308100800
Unit Type and Rh: 6200
Unit Type and Rh: 6200

## 2022-06-04 LAB — MAGNESIUM: Magnesium: 1.7 mg/dL (ref 1.7–2.4)

## 2022-06-04 MED ORDER — OXYCODONE HCL 5 MG PO TABS: 5.0000 mg | ORAL_TABLET | ORAL | 0 refills | Status: DC | PRN

## 2022-06-04 NOTE — Progress Notes (Signed)
Ng Discharge Note  Admit Date:  05/31/2022 Discharge date: 06/04/2022   Wendy Barber to be D/C'd Home per MD order.  AVS completed. Patient/caregiver able to verbalize understanding.  Discharge Medication: Allergies as of 06/04/2022       Reactions   Penicillins Shortness Of Breath   Other reaction(s): Irregular Heart Rate, Other (See Comments) Rapid heartrate   Alprazolam Hives, Other (See Comments)   Hard to arouse, unresponsiveness   Ativan [lorazepam] Other (See Comments)   Note: tolerates midazolam fine Face & Throat Swelling.   Corticosteroids Other (See Comments)   Other reaction(s): Other (see comments) Psychotic behaviour   Erythromycin    Other reaction(s): Other (See Comments) Severe stomach pain   Prednisone Other (See Comments)   Anxiety & Nervous Breakdown.   Savella  [milnacipran]    Prednisolone Anxiety        Medication List     STOP taking these medications    valsartan-hydrochlorothiazide 160-12.5 MG tablet Commonly known as: DIOVAN-HCT       TAKE these medications    alum & mag hydroxide-simeth 200-200-20 MG/5ML suspension Commonly known as: MAALOX/MYLANTA Take 15 mLs by mouth every 6 (six) hours as needed for indigestion or heartburn.   buPROPion 150 MG 24 hr tablet Commonly known as: Wellbutrin XL Take 1 tablet (150 mg total) by mouth daily.   busPIRone 15 MG tablet Commonly known as: BUSPAR Take 1 tablet (15 mg total) by mouth 3 (three) times daily.   citalopram 40 MG tablet Commonly known as: CeleXA Take 1 tablet (40 mg total) by mouth daily.   dicyclomine 20 MG tablet Commonly known as: BENTYL Take 1 tablet (20 mg total) by mouth in the morning, at noon, in the evening, and at bedtime.   diphenhydrAMINE 25 MG tablet Commonly known as: SOMINEX Take 25 mg by mouth daily as needed for itching.   diphenoxylate-atropine 2.5-0.025 MG tablet Commonly known as: LOMOTIL TAKE 2 TABLETS IN THE MORNING, AT NOON, IN THE EVENING AND AT  BEDTIME What changed: See the new instructions.   eszopiclone 2 MG Tabs tablet Commonly known as: Lunesta Take 1 tablet (2 mg total) by mouth at bedtime as needed for sleep. Take immediately before bedtime   famotidine 20 MG tablet Commonly known as: PEPCID TAKE 2 TABLETS TWICE A DAY   ferrous sulfate 325 (65 FE) MG tablet Take 1 tablet (325 mg total) by mouth daily.   folic acid 1 MG tablet Commonly known as: FOLVITE Take 2 tablets (2 mg total) by mouth daily.   hydrocortisone 2.5 % rectal cream Commonly known as: ANUSOL-HC Place rectally 2 (two) times daily.   loperamide 2 MG capsule Commonly known as: IMODIUM Take 2 capsules (4 mg total) by mouth 4 (four) times daily as needed for diarrhea or loose stools.   meclizine 25 MG tablet Commonly known as: ANTIVERT Take 1 tablet (25 mg total) by mouth 3 (three) times daily as needed for dizziness.   ondansetron 8 MG tablet Commonly known as: ZOFRAN Take 8 mg by mouth every 8 (eight) hours as needed for nausea.   orphenadrine 100 MG tablet Commonly known as: NORFLEX Take 1 tablet (100 mg total) by mouth at bedtime as needed for muscle spasms.   oxyCODONE 5 MG immediate release tablet Commonly known as: Roxicodone Take 1 tablet (5 mg total) by mouth every 4 (four) hours as needed for severe pain or breakthrough pain.   pantoprazole 40 MG tablet Commonly known as: PROTONIX Take 40 mg  by mouth every morning.   potassium chloride 10 MEQ tablet Commonly known as: KLOR-CON M Take 2 tablets (20 mEq total) by mouth daily.   promethazine 12.5 MG tablet Commonly known as: PHENERGAN Take 1 tablet (12.5 mg total) by mouth 2 (two) times daily as needed. What changed: reasons to take this   propranolol 20 MG tablet Commonly known as: INDERAL Take 1 tablet (20 mg total) by mouth 2 (two) times daily.   traMADol 50 MG tablet Commonly known as: ULTRAM Take 1 tablet (50 mg total) by mouth every 6 (six) hours as needed for  moderate pain.   Xarelto 20 MG Tabs tablet Generic drug: rivaroxaban Take 20 mg by mouth daily.        Discharge Assessment: Vitals:   06/03/22 2002 06/04/22 0340  BP: 138/88 122/63  Pulse: 63 62  Resp: 14 14  Temp: 98.2 F (36.8 C) 98.3 F (36.8 C)  SpO2: 100% 93%   Skin clean, dry and intact without evidence of skin break down, no evidence of skin tears noted. IV catheter discontinued intact. Site without signs and symptoms of complications - no redness or edema noted at insertion site, patient denies c/o pain - only slight tenderness at site.  Dressing with slight pressure applied.  D/c Instructions-Education: Discharge instructions given to patient/family with verbalized understanding. D/c education completed with patient/family including follow up instructions, medication list, d/c activities limitations if indicated, with other d/c instructions as indicated by MD - patient able to verbalize understanding, all questions fully answered. Patient instructed to return to ED, call 911, or call MD for any changes in condition.  Patient escorted via [place], and D/C home via private auto.  [name], LPN 12/29/6576 46:96 AM

## 2022-06-04 NOTE — Progress Notes (Signed)
Patient seen and evaluated this a.m. and she is complaining of some mild abdominal pain, but this is not unusual for her.  She has still having some ongoing low back pain and oxycodone has been prescribed for limited quantity and 0 refills to assist with pain management at home.  Please refer to discharge summary dictated 8/11 for full details.  She remains in stable condition for discharge.  Total care time: 20 minutes.

## 2022-06-05 LAB — CULTURE, BLOOD (ROUTINE X 2)
Culture: NO GROWTH
Culture: NO GROWTH
Special Requests: ADEQUATE
Special Requests: ADEQUATE

## 2022-06-06 ENCOUNTER — Encounter: Payer: Self-pay | Admitting: Behavioral Health

## 2022-06-06 ENCOUNTER — Telehealth (INDEPENDENT_AMBULATORY_CARE_PROVIDER_SITE_OTHER): Payer: Medicare Other | Admitting: Behavioral Health

## 2022-06-06 ENCOUNTER — Other Ambulatory Visit: Payer: Self-pay | Admitting: Behavioral Health

## 2022-06-06 ENCOUNTER — Other Ambulatory Visit: Payer: Self-pay | Admitting: Hematology & Oncology

## 2022-06-06 DIAGNOSIS — F5105 Insomnia due to other mental disorder: Secondary | ICD-10-CM

## 2022-06-06 DIAGNOSIS — F411 Generalized anxiety disorder: Secondary | ICD-10-CM

## 2022-06-06 DIAGNOSIS — F418 Other specified anxiety disorders: Secondary | ICD-10-CM | POA: Diagnosis not present

## 2022-06-06 DIAGNOSIS — F331 Major depressive disorder, recurrent, moderate: Secondary | ICD-10-CM

## 2022-06-06 NOTE — Progress Notes (Signed)
Wendy Barber [id number] [birth year] 61 y.o.  Virtual Visit via Video Note  I connected with pt @ on 06/06/22 at  9:30 AM EDT by a video enabled telemedicine application and verified that I am speaking with the correct person using two identifiers.   I discussed the limitations of evaluation and management by telemedicine and the availability of in person appointments. The patient expressed understanding and agreed to proceed.  I discussed the assessment and treatment plan with the patient. The patient was provided an opportunity to ask questions and all were answered. The patient agreed with the plan and demonstrated an understanding of the instructions.   The patient was advised to call back or seek an in-person evaluation if the symptoms worsen or if the condition fails to improve as anticipated.  I provided 20 minutes of non-face-to-face time during this encounter.  The patient was located at home.  The provider was located at [place].   Elwanda [place], NP   Subjective:   Patient ID:  Wendy Barber is a 61 y.o. (DOB 20-Aug-1961) female.  Chief Complaint: No chief complaint on file.   HPI  61 year old female presents to this office via video visit for follow up and medication management.  Her husband was present during video visit with her consent. She has had to recent hospitalizations due to rectal bleeding and low hemoglobin. She later reported a seizure where she was transported by ambulance to hospital. She agreed that no psychiatric medication changes should be made at this time and to follow up after she has physically improved.  Reports her anxiety today at 4/10 and depression at 6/10. She is sleeping 6 hours per night. She understands that having short bowel syndrome is lifetime struggle. Her family remains highly supportive. She denies any mania, no psychosis, No current SI or HI.    Review of Systems:  Review of Systems  Constitutional: Negative.    Gastrointestinal:  Positive for blood in stool, diarrhea and nausea.  Allergic/Immunologic: Negative.   Psychiatric/Behavioral:  Positive for dysphoric mood. The patient is nervous/anxious.     Medications: I have reviewed the patient's current medications.  Current Outpatient Medications  Medication Sig Dispense Refill   alum & mag hydroxide-simeth (MAALOX/MYLANTA) 200-200-20 MG/5ML suspension Take 15 mLs by mouth every 6 (six) hours as needed for indigestion or heartburn. 355 mL 0   buPROPion (WELLBUTRIN XL) 150 MG 24 hr tablet Take 1 tablet (150 mg total) by mouth daily. 90 tablet 0   busPIRone (BUSPAR) 15 MG tablet Take 1 tablet (15 mg total) by mouth 3 (three) times daily. 270 tablet 3   citalopram (CELEXA) 40 MG tablet Take 1 tablet (40 mg total) by mouth daily. 90 tablet 0   dicyclomine (BENTYL) 20 MG tablet Take 1 tablet (20 mg total) by mouth in the morning, at noon, in the evening, and at bedtime.     diphenhydrAMINE (SOMINEX) 25 MG tablet Take 25 mg by mouth daily as needed for itching.     diphenoxylate-atropine (LOMOTIL) 2.5-0.025 MG tablet TAKE 2 TABLETS IN THE MORNING, AT NOON, IN THE EVENING AND AT BEDTIME (Patient taking differently: Take 2 tablets by mouth 4 (four) times daily.) 240 tablet 3   eszopiclone (LUNESTA) 2 MG TABS tablet Take 1 tablet (2 mg total) by mouth at bedtime as needed for sleep. Take immediately before bedtime 30 tablet 3   famotidine (PEPCID) 20 MG tablet TAKE 2 TABLETS TWICE A DAY (Patient taking differently: Take 40 mg  by mouth 2 (two) times daily.) 360 tablet 3   ferrous sulfate 325 (65 FE) MG tablet Take 1 tablet (325 mg total) by mouth daily. 30 tablet 3   folic acid (FOLVITE) 1 MG tablet Take 2 tablets (2 mg total) by mouth daily. 60 tablet 0   hydrocortisone (ANUSOL-HC) 2.5 % rectal cream Place rectally 2 (two) times daily. 30 g 1   loperamide (IMODIUM) 2 MG capsule Take 2 capsules (4 mg total) by mouth 4 (four) times daily as needed for diarrhea  or loose stools.     meclizine (ANTIVERT) 25 MG tablet Take 1 tablet (25 mg total) by mouth 3 (three) times daily as needed for dizziness. (Patient not taking: Reported on 05/31/2022) 30 tablet 0   ondansetron (ZOFRAN) 8 MG tablet Take 8 mg by mouth every 8 (eight) hours as needed for nausea.     orphenadrine (NORFLEX) 100 MG tablet Take 1 tablet (100 mg total) by mouth at bedtime as needed for muscle spasms. 30 tablet 2   oxyCODONE (ROXICODONE) 5 MG immediate release tablet Take 1 tablet (5 mg total) by mouth every 4 (four) hours as needed for severe pain or breakthrough pain. 20 tablet 0   pantoprazole (PROTONIX) 40 MG tablet Take 40 mg by mouth every morning.     potassium chloride SA (KLOR-CON M) 10 MEQ tablet Take 2 tablets (20 mEq total) by mouth daily. 30 tablet 1   promethazine (PHENERGAN) 12.5 MG tablet Take 1 tablet (12.5 mg total) by mouth 2 (two) times daily as needed. (Patient taking differently: Take 12.5 mg by mouth 2 (two) times daily as needed for nausea.) 30 tablet 3   propranolol (INDERAL) 20 MG tablet Take 1 tablet (20 mg total) by mouth 2 (two) times daily. 180 tablet 1   traMADol (ULTRAM) 50 MG tablet Take 1 tablet (50 mg total) by mouth every 6 (six) hours as needed for moderate pain. 20 tablet 0   XARELTO 20 MG TABS tablet Take 20 mg by mouth daily. (Patient not taking: Reported on 05/31/2022)     No current facility-administered medications for this visit.    Medication Side Effects: None  Allergies:  Allergies  Allergen Reactions   Penicillins Shortness Of Breath    Other reaction(s): Irregular Heart Rate, Other (See Comments) Rapid heartrate    Alprazolam Hives and Other (See Comments)    Hard to arouse, unresponsiveness   Ativan [Lorazepam] Other (See Comments)    Note: tolerates midazolam fine Face & Throat Swelling.   Corticosteroids Other (See Comments)    Other reaction(s): Other (see comments) Psychotic behaviour     Erythromycin     Other reaction(s):  Other (See Comments) Severe stomach pain    Prednisone Other (See Comments)    Anxiety & Nervous Breakdown.   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   Pernicious anemia 05/14/2021   Presence of IVC filter 03/19/2020   Pulmonary  embolism, bilateral ([place]) 03/19/2020    Family History  Problem Relation Age of Onset   Depression Mother    Drug abuse Sister    Suicidality Sister    Alcohol abuse Brother    Drug abuse Brother     Social History   Socioeconomic History   Marital status: Married    Spouse name: Not on file   Number of children: 3   Years of education: Not on file   Highest education level: Some college, no degree  Occupational History   Occupation: disability  Tobacco Use   Smoking status: Never   Smokeless tobacco: Never  Vaping Use   Vaping Use: Never used  Substance and Sexual Activity   Alcohol use: No   Drug use: Never   Sexual activity: Yes    Birth control/protection: None    Comment: hysterectomy  Other Topics Concern   Not on file  Social History Narrative   Right handed   One story home   Drinks occasional caffeine   Social Determinants of Health   Financial Resource Strain: Not on file  Food Insecurity: Not on file  Transportation Needs: Not on file  Physical Activity: Not on file  Stress: Not on file  Social Connections: Not on file  Intimate Partner Violence: Not on file    Past Medical History, Surgical  history, Social history, and Family history were reviewed and updated as appropriate.   Please see review of systems for further details on the patient's review from today.   Objective:   Physical Exam:  There were no vitals taken for this visit.  Physical Exam  Lab Review:     Component Value Date/Time   NA 140 06/04/2022 0601   K 3.8 06/04/2022 0601   CL 109 06/04/2022 0601   CO2 26 06/04/2022 0601   GLUCOSE 99 06/04/2022 0601   BUN 20 06/04/2022 0601   CREATININE 0.83 06/04/2022 0601   CREATININE 0.94 03/02/2022 0851   CALCIUM 8.9 06/04/2022 0601   PROT 7.4 06/01/2022 0520   ALBUMIN 3.4 (L) 06/01/2022 0520   AST 19 06/01/2022 0520   AST 23 03/02/2022 0851   ALT 16 06/01/2022 0520   ALT 21 03/02/2022 0851   ALKPHOS 140 (H) 06/01/2022 0520   BILITOT 0.7 06/01/2022 0520   BILITOT 0.4 03/02/2022 0851   GFRNONAA >60 06/04/2022 0601   GFRNONAA >60 03/02/2022 0851   GFRAA >60 07/27/2020 0922       Component Value Date/Time   WBC 10.0 06/04/2022 0601   RBC 2.78 (L) 06/04/2022 0601   HGB 7.8 (L) 06/04/2022 0601   HGB 12.0 03/02/2022 0851   HCT 25.2 (L) 06/04/2022 0601   PLT 155 06/04/2022 0601   PLT 285 03/02/2022 0851   MCV 90.6 06/04/2022 0601   MCH 28.1 06/04/2022 0601   MCHC 31.0 06/04/2022 0601   RDW 15.6 (H) 06/04/2022 0601   LYMPHSABS 2.7 05/31/2022 1945   MONOABS 0.7 05/31/2022 1945   EOSABS 0.1 05/31/2022 1945   BASOSABS 0.1 05/31/2022 1945    No results found for: "POCLITH", "LITHIUM"   No results found for: "PHENYTOIN", "PHENOBARB", "VALPROATE", "CBMZ"   .res Assessment: Plan:     Greater than 50% of 20 min video visit with patient was spent on counseling and coordination of care.  We discussed her current level of stability with anxiety and depression. She has had two recent hospitalizations due to rectal bleeding and low hemoglobin. We discussed not making any psychiatric medications at  this time until she is physiologically stabilized.  We  agreed to:  To continue Wellbutrin 150 mg XR daily Continue Buspar  to 15 mg to  three times daily as needed. Continue Celexa to 40 mg daily. Will take with food to help with nausea. Could make diarrhea worse in the beginning but may pass after a week or two.  To Continue Lunesta 5 mg at betime. Belsomra was not approved by insurance. She is happy with Johnnye Sima but says causes metallic taste in mouth next day.  Will report worsening symptoms or side effects promptly To follow up in 6 weeks to reassess Will continue to follow up with PCP regularly Provided emergency contact information Reviewed [place], NP             There are no diagnoses linked to this encounter.   Please see After Visit Summary for patient specific instructions.  Future Appointments  Date Time Provider [place]  06/23/2022  2:20 PM [name], MD [facility]  08/30/2022 10:10 AM Pieter Partridge, DO [facility] None    No orders of the defined types were placed in this encounter.     -------------------------------

## 2022-06-07 ENCOUNTER — Telehealth: Payer: Self-pay

## 2022-06-07 NOTE — Telephone Encounter (Signed)
Transition Care Management Unsuccessful Follow-up Telephone Call  Date of discharge and from where:  06/04/2022  [facility]   Attempts:  1st Attempt  Reason for unsuccessful TCM follow-up call:  No answer/busy

## 2022-06-08 ENCOUNTER — Other Ambulatory Visit: Payer: Self-pay

## 2022-06-08 ENCOUNTER — Telehealth: Payer: Self-pay | Admitting: Neurology

## 2022-06-08 DIAGNOSIS — E237 Disorder of pituitary gland, unspecified: Secondary | ICD-10-CM

## 2022-06-08 NOTE — Telephone Encounter (Signed)
MRI of brain with and without contrast from 06/07/2022 appears to be overall stable compared to prior imaging from 2021.  There is mention of asymmetry of the pituitary gland.  This is of unknown significance.  For better visualization, I would like to order MRI of pituitary gland with and without contrast (reason:  abnormal pituitary)

## 2022-06-08 NOTE — Telephone Encounter (Signed)
Called pateint and she agreed to the MRI I have ordered it and sent to GI

## 2022-06-09 NOTE — Telephone Encounter (Signed)
Transition Care Management Unsuccessful Follow-up Telephone Call  Date of discharge and from where:  06/04/2022  [facility]  Attempts:  2nd Attempt  Reason for unsuccessful TCM follow-up call:  Unable to leave message

## 2022-06-12 ENCOUNTER — Encounter: Payer: Self-pay | Admitting: Hematology & Oncology

## 2022-06-12 ENCOUNTER — Encounter: Payer: Self-pay | Admitting: Family Medicine

## 2022-06-13 ENCOUNTER — Encounter (HOSPITAL_COMMUNITY): Payer: Self-pay

## 2022-06-13 ENCOUNTER — Telehealth: Payer: Self-pay | Admitting: *Deleted

## 2022-06-13 ENCOUNTER — Other Ambulatory Visit: Payer: Self-pay | Admitting: Neurology

## 2022-06-13 ENCOUNTER — Other Ambulatory Visit: Payer: Self-pay

## 2022-06-13 ENCOUNTER — Observation Stay (HOSPITAL_COMMUNITY): Payer: Medicare Other

## 2022-06-13 ENCOUNTER — Inpatient Hospital Stay (HOSPITAL_COMMUNITY)
Admission: EM | Admit: 2022-06-13 | Discharge: 2022-06-26 | DRG: 270 | Disposition: A | Discharge status: Home / Self Care | Payer: Medicare Other | Attending: Internal Medicine | Admitting: Internal Medicine

## 2022-06-13 DIAGNOSIS — I2510 Atherosclerotic heart disease of native coronary artery without angina pectoris: Secondary | ICD-10-CM | POA: Diagnosis present

## 2022-06-13 DIAGNOSIS — N179 Acute kidney failure, unspecified: Secondary | ICD-10-CM | POA: Diagnosis present

## 2022-06-13 DIAGNOSIS — I951 Orthostatic hypotension: Secondary | ICD-10-CM | POA: Diagnosis present

## 2022-06-13 DIAGNOSIS — I959 Hypotension, unspecified: Secondary | ICD-10-CM | POA: Diagnosis present

## 2022-06-13 DIAGNOSIS — R251 Tremor, unspecified: Secondary | ICD-10-CM | POA: Diagnosis present

## 2022-06-13 DIAGNOSIS — D509 Iron deficiency anemia, unspecified: Secondary | ICD-10-CM

## 2022-06-13 DIAGNOSIS — F419 Anxiety disorder, unspecified: Secondary | ICD-10-CM | POA: Diagnosis present

## 2022-06-13 DIAGNOSIS — R55 Syncope and collapse: Principal | ICD-10-CM

## 2022-06-13 DIAGNOSIS — C786 Secondary malignant neoplasm of retroperitoneum and peritoneum: Secondary | ICD-10-CM | POA: Diagnosis present

## 2022-06-13 DIAGNOSIS — I82413 Acute embolism and thrombosis of femoral vein, bilateral: Secondary | ICD-10-CM | POA: Diagnosis present

## 2022-06-13 DIAGNOSIS — Z9889 Other specified postprocedural states: Secondary | ICD-10-CM | POA: Insufficient documentation

## 2022-06-13 DIAGNOSIS — Z90710 Acquired absence of both cervix and uterus: Secondary | ICD-10-CM

## 2022-06-13 DIAGNOSIS — K2970 Gastritis, unspecified, without bleeding: Secondary | ICD-10-CM | POA: Diagnosis present

## 2022-06-13 DIAGNOSIS — K754 Autoimmune hepatitis: Secondary | ICD-10-CM | POA: Diagnosis present

## 2022-06-13 DIAGNOSIS — G250 Essential tremor: Secondary | ICD-10-CM | POA: Diagnosis present

## 2022-06-13 DIAGNOSIS — M1990 Unspecified osteoarthritis, unspecified site: Secondary | ICD-10-CM | POA: Diagnosis present

## 2022-06-13 DIAGNOSIS — Z9049 Acquired absence of other specified parts of digestive tract: Secondary | ICD-10-CM

## 2022-06-13 DIAGNOSIS — E66813 Obesity, class 3: Secondary | ICD-10-CM | POA: Diagnosis present

## 2022-06-13 DIAGNOSIS — R12 Heartburn: Secondary | ICD-10-CM | POA: Insufficient documentation

## 2022-06-13 DIAGNOSIS — K649 Unspecified hemorrhoids: Secondary | ICD-10-CM | POA: Diagnosis present

## 2022-06-13 DIAGNOSIS — Z86718 Personal history of other venous thrombosis and embolism: Secondary | ICD-10-CM

## 2022-06-13 DIAGNOSIS — N390 Urinary tract infection, site not specified: Secondary | ICD-10-CM | POA: Diagnosis present

## 2022-06-13 DIAGNOSIS — F32A Depression, unspecified: Secondary | ICD-10-CM | POA: Diagnosis present

## 2022-06-13 DIAGNOSIS — I82403 Acute embolism and thrombosis of unspecified deep veins of lower extremity, bilateral: Secondary | ICD-10-CM | POA: Diagnosis present

## 2022-06-13 DIAGNOSIS — I82412 Acute embolism and thrombosis of left femoral vein: Secondary | ICD-10-CM

## 2022-06-13 DIAGNOSIS — Z7901 Long term (current) use of anticoagulants: Secondary | ICD-10-CM

## 2022-06-13 DIAGNOSIS — K922 Gastrointestinal hemorrhage, unspecified: Secondary | ICD-10-CM

## 2022-06-13 DIAGNOSIS — T82868A Thrombosis of vascular prosthetic devices, implants and grafts, initial encounter: Secondary | ICD-10-CM | POA: Diagnosis not present

## 2022-06-13 DIAGNOSIS — G9332 Myalgic encephalomyelitis/chronic fatigue syndrome: Secondary | ICD-10-CM | POA: Diagnosis present

## 2022-06-13 DIAGNOSIS — Z79899 Other long term (current) drug therapy: Secondary | ICD-10-CM

## 2022-06-13 DIAGNOSIS — D6859 Other primary thrombophilia: Secondary | ICD-10-CM | POA: Diagnosis present

## 2022-06-13 DIAGNOSIS — Z86711 Personal history of pulmonary embolism: Secondary | ICD-10-CM

## 2022-06-13 DIAGNOSIS — G8929 Other chronic pain: Secondary | ICD-10-CM | POA: Diagnosis present

## 2022-06-13 DIAGNOSIS — I700 Atherosclerosis of aorta: Secondary | ICD-10-CM | POA: Diagnosis present

## 2022-06-13 DIAGNOSIS — I82463 Acute embolism and thrombosis of calf muscular vein, bilateral: Secondary | ICD-10-CM | POA: Diagnosis present

## 2022-06-13 DIAGNOSIS — C772 Secondary and unspecified malignant neoplasm of intra-abdominal lymph nodes: Secondary | ICD-10-CM | POA: Diagnosis present

## 2022-06-13 DIAGNOSIS — I745 Embolism and thrombosis of iliac artery: Secondary | ICD-10-CM | POA: Diagnosis present

## 2022-06-13 DIAGNOSIS — I10 Essential (primary) hypertension: Secondary | ICD-10-CM | POA: Diagnosis present

## 2022-06-13 DIAGNOSIS — Z8509 Personal history of malignant neoplasm of other digestive organs: Secondary | ICD-10-CM

## 2022-06-13 DIAGNOSIS — C181 Malignant neoplasm of appendix: Secondary | ICD-10-CM

## 2022-06-13 DIAGNOSIS — K209 Esophagitis, unspecified without bleeding: Secondary | ICD-10-CM | POA: Diagnosis not present

## 2022-06-13 DIAGNOSIS — D320 Benign neoplasm of cerebral meninges: Secondary | ICD-10-CM | POA: Diagnosis present

## 2022-06-13 DIAGNOSIS — B9620 Unspecified Escherichia coli [E. coli] as the cause of diseases classified elsewhere: Secondary | ICD-10-CM | POA: Diagnosis present

## 2022-06-13 DIAGNOSIS — I82220 Acute embolism and thrombosis of inferior vena cava: Secondary | ICD-10-CM | POA: Diagnosis present

## 2022-06-13 DIAGNOSIS — R197 Diarrhea, unspecified: Secondary | ICD-10-CM | POA: Diagnosis present

## 2022-06-13 DIAGNOSIS — E876 Hypokalemia: Secondary | ICD-10-CM | POA: Diagnosis not present

## 2022-06-13 DIAGNOSIS — N1330 Unspecified hydronephrosis: Secondary | ICD-10-CM | POA: Diagnosis not present

## 2022-06-13 DIAGNOSIS — Z95828 Presence of other vascular implants and grafts: Secondary | ICD-10-CM

## 2022-06-13 DIAGNOSIS — M797 Fibromyalgia: Secondary | ICD-10-CM | POA: Diagnosis present

## 2022-06-13 DIAGNOSIS — R531 Weakness: Secondary | ICD-10-CM | POA: Diagnosis present

## 2022-06-13 DIAGNOSIS — D500 Iron deficiency anemia secondary to blood loss (chronic): Secondary | ICD-10-CM

## 2022-06-13 DIAGNOSIS — R001 Bradycardia, unspecified: Secondary | ICD-10-CM | POA: Insufficient documentation

## 2022-06-13 DIAGNOSIS — Y712 Prosthetic and other implants, materials and accessory cardiovascular devices associated with adverse incidents: Secondary | ICD-10-CM | POA: Diagnosis present

## 2022-06-13 DIAGNOSIS — I82433 Acute embolism and thrombosis of popliteal vein, bilateral: Secondary | ICD-10-CM | POA: Diagnosis present

## 2022-06-13 DIAGNOSIS — I82422 Acute embolism and thrombosis of left iliac vein: Secondary | ICD-10-CM | POA: Diagnosis present

## 2022-06-13 DIAGNOSIS — Z9221 Personal history of antineoplastic chemotherapy: Secondary | ICD-10-CM

## 2022-06-13 DIAGNOSIS — Z85038 Personal history of other malignant neoplasm of large intestine: Secondary | ICD-10-CM

## 2022-06-13 DIAGNOSIS — K7460 Unspecified cirrhosis of liver: Secondary | ICD-10-CM | POA: Diagnosis present

## 2022-06-13 DIAGNOSIS — Z6841 Body Mass Index (BMI) 40.0 and over, adult: Secondary | ICD-10-CM

## 2022-06-13 DIAGNOSIS — I82411 Acute embolism and thrombosis of right femoral vein: Secondary | ICD-10-CM | POA: Diagnosis not present

## 2022-06-13 DIAGNOSIS — Z818 Family history of other mental and behavioral disorders: Secondary | ICD-10-CM

## 2022-06-13 DIAGNOSIS — E1165 Type 2 diabetes mellitus with hyperglycemia: Secondary | ICD-10-CM

## 2022-06-13 DIAGNOSIS — M549 Dorsalgia, unspecified: Secondary | ICD-10-CM | POA: Diagnosis present

## 2022-06-13 DIAGNOSIS — E119 Type 2 diabetes mellitus without complications: Secondary | ICD-10-CM

## 2022-06-13 DIAGNOSIS — I82813 Embolism and thrombosis of superficial veins of lower extremities, bilateral: Secondary | ICD-10-CM | POA: Diagnosis present

## 2022-06-13 LAB — URINALYSIS, ROUTINE W REFLEX MICROSCOPIC
Bilirubin Urine: NEGATIVE
Glucose, UA: NEGATIVE mg/dL
Ketones, ur: NEGATIVE mg/dL
Nitrite: NEGATIVE
Protein, ur: 30 mg/dL — AB
Specific Gravity, Urine: 1.015 (ref 1.005–1.030)
WBC, UA: >50 WBC/hpf — ABNORMAL HIGH (ref 0–5)
pH: 5 (ref 5.0–8.0)

## 2022-06-13 LAB — COMPREHENSIVE METABOLIC PANEL
ALT: 16 U/L (ref 0–44)
AST: 27 U/L (ref 15–41)
Albumin: 3.6 g/dL (ref 3.5–5.0)
Alkaline Phosphatase: 154 U/L — ABNORMAL HIGH (ref 38–126)
Anion gap: 13 (ref 5–15)
BUN: 42 mg/dL — ABNORMAL HIGH (ref 6–20)
CO2: 19 mmol/L — ABNORMAL LOW (ref 22–32)
Calcium: 9.1 mg/dL (ref 8.9–10.3)
Chloride: 103 mmol/L (ref 98–111)
Creatinine, Ser: 2.03 mg/dL — ABNORMAL HIGH (ref 0.44–1.00)
GFR, Estimated: 28 mL/min — ABNORMAL LOW (ref >60)
Glucose, Bld: 148 mg/dL — ABNORMAL HIGH (ref 70–99)
Potassium: 3.9 mmol/L (ref 3.5–5.1)
Sodium: 135 mmol/L (ref 135–145)
Total Bilirubin: <0.1 mg/dL — ABNORMAL LOW (ref 0.3–1.2)
Total Protein: 8.3 g/dL — ABNORMAL HIGH (ref 6.5–8.1)

## 2022-06-13 LAB — GLUCOSE, CAPILLARY: Glucose-Capillary: 167 mg/dL — ABNORMAL HIGH (ref 70–99)

## 2022-06-13 LAB — CBC
HCT: 29 % — ABNORMAL LOW (ref 36.0–46.0)
Hemoglobin: 8.8 g/dL — ABNORMAL LOW (ref 12.0–15.0)
MCH: 28.6 pg (ref 26.0–34.0)
MCHC: 30.3 g/dL (ref 30.0–36.0)
MCV: 94.2 fL (ref 80.0–100.0)
Platelets: 185 10*3/uL (ref 150–400)
RBC: 3.08 MIL/uL — ABNORMAL LOW (ref 3.87–5.11)
RDW: 19.3 % — ABNORMAL HIGH (ref 11.5–15.5)
WBC: 15 10*3/uL — ABNORMAL HIGH (ref 4.0–10.5)
nRBC: 0.1 % (ref 0.0–0.2)

## 2022-06-13 LAB — PROTIME-INR
INR: 1.3 — ABNORMAL HIGH (ref 0.8–1.2)
Prothrombin Time: 16.4 seconds — ABNORMAL HIGH (ref 11.4–15.2)

## 2022-06-13 LAB — TROPONIN I (HIGH SENSITIVITY): Troponin I (High Sensitivity): 4 ng/L (ref <18)

## 2022-06-13 LAB — APTT: aPTT: 32 seconds (ref 24–36)

## 2022-06-13 MED ORDER — ACETAMINOPHEN 650 MG RE SUPP: 650.0000 mg | Freq: Four times a day (QID) | RECTAL | Status: DC | PRN

## 2022-06-13 MED ORDER — LACTATED RINGERS IV BOLUS
500.0000 mL | Freq: Once | INTRAVENOUS | Status: AC
  Administered 2022-06-13: 500 mL via INTRAVENOUS

## 2022-06-13 MED ORDER — ALUM & MAG HYDROXIDE-SIMETH 200-200-20 MG/5ML PO SUSP
15.0000 mL | Freq: Four times a day (QID) | ORAL | Status: DC | PRN
Start: 2022-06-13 — End: 2022-06-26
  Filled 2022-06-13: qty 30

## 2022-06-13 MED ORDER — DIPHENOXYLATE-ATROPINE 2.5-0.025 MG PO TABS
2.0000 | ORAL_TABLET | Freq: Four times a day (QID) | ORAL | Status: DC
  Administered 2022-06-13 – 2022-06-26 (×37): 2 via ORAL
  Filled 2022-06-13 (×45): qty 2

## 2022-06-13 MED ORDER — HEPARIN (PORCINE) 25000 UT/250ML-% IV SOLN
1550.0000 [IU]/h | INTRAVENOUS | Status: DC
  Administered 2022-06-13: 1400 [IU]/h via INTRAVENOUS
  Administered 2022-06-14 – 2022-06-16 (×5): 1600 [IU]/h via INTRAVENOUS
  Administered 2022-06-17: 1550 [IU]/h via INTRAVENOUS
  Filled 2022-06-13 (×7): qty 250

## 2022-06-13 MED ORDER — LACTATED RINGERS IV SOLN: INTRAVENOUS | Status: DC

## 2022-06-13 MED ORDER — DIPHENHYDRAMINE HCL 25 MG PO CAPS
25.0000 mg | ORAL_CAPSULE | Freq: Every day | ORAL | Status: DC | PRN
Start: 2022-06-13 — End: 2022-06-26
  Administered 2022-06-22: 25 mg via ORAL
  Filled 2022-06-13 (×2): qty 1

## 2022-06-13 MED ORDER — BUSPIRONE HCL 5 MG PO TABS
15.0000 mg | ORAL_TABLET | Freq: Three times a day (TID) | ORAL | Status: DC
  Administered 2022-06-13 – 2022-06-26 (×36): 15 mg via ORAL
  Filled 2022-06-13 (×38): qty 1

## 2022-06-13 MED ORDER — HEPARIN BOLUS VIA INFUSION
2000.0000 [IU] | Freq: Once | INTRAVENOUS | Status: AC
  Administered 2022-06-13: 2000 [IU] via INTRAVENOUS
  Filled 2022-06-13: qty 2000

## 2022-06-13 MED ORDER — HYDROMORPHONE HCL 1 MG/ML IJ SOLN
1.0000 mg | Freq: Once | INTRAMUSCULAR | Status: AC
  Administered 2022-06-13: 1 mg via INTRAVENOUS
  Filled 2022-06-13: qty 1

## 2022-06-13 MED ORDER — TRAMADOL HCL 50 MG PO TABS
50.0000 mg | ORAL_TABLET | Freq: Four times a day (QID) | ORAL | Status: DC | PRN
  Administered 2022-06-15: 50 mg via ORAL
  Filled 2022-06-13: qty 1

## 2022-06-13 MED ORDER — FAMOTIDINE 20 MG PO TABS
20.0000 mg | ORAL_TABLET | Freq: Two times a day (BID) | ORAL | Status: DC
  Administered 2022-06-13 – 2022-06-26 (×24): 20 mg via ORAL
  Filled 2022-06-13 (×25): qty 1

## 2022-06-13 MED ORDER — PANTOPRAZOLE SODIUM 40 MG PO TBEC
40.0000 mg | DELAYED_RELEASE_TABLET | Freq: Every morning | ORAL | Status: DC
  Administered 2022-06-14 – 2022-06-15 (×2): 40 mg via ORAL
  Filled 2022-06-13 (×2): qty 1

## 2022-06-13 MED ORDER — LOPERAMIDE HCL 2 MG PO CAPS
4.0000 mg | ORAL_CAPSULE | Freq: Four times a day (QID) | ORAL | Status: DC | PRN
Start: 2022-06-13 — End: 2022-06-26
  Administered 2022-06-14 – 2022-06-18 (×2): 4 mg via ORAL
  Filled 2022-06-13 (×2): qty 2

## 2022-06-13 MED ORDER — ACETAMINOPHEN 325 MG PO TABS
650.0000 mg | ORAL_TABLET | Freq: Four times a day (QID) | ORAL | Status: DC | PRN
  Administered 2022-06-13 – 2022-06-23 (×15): 650 mg via ORAL
  Filled 2022-06-13 (×16): qty 2

## 2022-06-13 MED ORDER — ONDANSETRON HCL 4 MG PO TABS
4.0000 mg | ORAL_TABLET | Freq: Four times a day (QID) | ORAL | Status: DC | PRN
  Administered 2022-06-14 – 2022-06-25 (×5): 4 mg via ORAL
  Filled 2022-06-13 (×5): qty 1

## 2022-06-13 MED ORDER — DIAZEPAM 5 MG PO TABS
ORAL_TABLET | ORAL | 0 refills | Status: DC
Start: 2022-06-13 — End: 2022-07-25

## 2022-06-13 MED ORDER — HYDROMORPHONE HCL 1 MG/ML IJ SOLN
1.0000 mg | INTRAMUSCULAR | Status: DC | PRN
  Administered 2022-06-13 – 2022-06-15 (×10): 1 mg via INTRAVENOUS
  Filled 2022-06-13 (×10): qty 1

## 2022-06-13 MED ORDER — CITALOPRAM HYDROBROMIDE 20 MG PO TABS
40.0000 mg | ORAL_TABLET | Freq: Every day | ORAL | Status: DC
  Administered 2022-06-14 – 2022-06-26 (×10): 40 mg via ORAL
  Filled 2022-06-13 (×11): qty 2

## 2022-06-13 MED ORDER — ONDANSETRON HCL 4 MG/2ML IJ SOLN
4.0000 mg | Freq: Four times a day (QID) | INTRAMUSCULAR | Status: DC | PRN
  Administered 2022-06-14 – 2022-06-26 (×20): 4 mg via INTRAVENOUS
  Filled 2022-06-13 (×21): qty 2

## 2022-06-13 MED ORDER — SODIUM CHLORIDE 0.9% FLUSH
3.0000 mL | Freq: Two times a day (BID) | INTRAVENOUS | Status: DC
  Administered 2022-06-13 – 2022-06-26 (×18): 3 mL via INTRAVENOUS

## 2022-06-13 MED ORDER — BUPROPION HCL ER (XL) 150 MG PO TB24
150.0000 mg | ORAL_TABLET | Freq: Every day | ORAL | Status: DC
  Administered 2022-06-14 – 2022-06-26 (×10): 150 mg via ORAL
  Filled 2022-06-13 (×11): qty 1

## 2022-06-13 MED ORDER — LACTATED RINGERS IV BOLUS
1000.0000 mL | INTRAVENOUS | Status: AC
  Administered 2022-06-13: 1000 mL via INTRAVENOUS

## 2022-06-13 MED ORDER — DICYCLOMINE HCL 20 MG PO TABS
20.0000 mg | ORAL_TABLET | Freq: Three times a day (TID) | ORAL | Status: DC
  Administered 2022-06-13 – 2022-06-26 (×34): 20 mg via ORAL
  Filled 2022-06-13 (×55): qty 1

## 2022-06-13 MED ORDER — MECLIZINE HCL 25 MG PO TABS
25.0000 mg | ORAL_TABLET | Freq: Three times a day (TID) | ORAL | Status: DC
  Administered 2022-06-14 – 2022-06-19 (×17): 25 mg via ORAL
  Filled 2022-06-13 (×22): qty 1

## 2022-06-13 MED ORDER — ONDANSETRON HCL 4 MG/2ML IJ SOLN
4.0000 mg | Freq: Once | INTRAMUSCULAR | Status: AC
  Administered 2022-06-13: 4 mg via INTRAVENOUS
  Filled 2022-06-13: qty 2

## 2022-06-13 NOTE — Telephone Encounter (Signed)
Patient mychart was already responded to by Oncology. ,message is below  Call placed to patient's husband in response to MyChart message sent on 06/12/22.  Pt.'s husband, Ray notified per order of Dr. [name] to take pt to the [facility] ER now.  Ray states that he will take pt to the [place] now.

## 2022-06-13 NOTE — Telephone Encounter (Signed)
Call placed to patient's husband in response to MyChart message sent on 06/12/22.  Pt.'s husband, Ray notified per order of Dr. [name] to take pt to the [facility] ER now.  Ray states that he will take pt to the [place] now.

## 2022-06-13 NOTE — ED Triage Notes (Signed)
Pt BIB husband. Pt has been having generalized weakness and near syncopal episodes at home. Pt had a syncopal episode on the toilet while in the lobby. Pts husband reports recent hx of internal bleeding.

## 2022-06-13 NOTE — Progress Notes (Signed)
1500 Pt arrived from ED. Orthostatic VS taken. Hypotensive with standing BP 69/40. MD ordered LR x 1 L bolus. Administered, BP increased WNL. Pt has not urinated since around 1000 today. Dr. Olevia Bowens at bedside & informed attending; pt attempted to urinate in bedpan unsuccessfully. Bladder scanned with max of 30 ml retention. MD also notified, with new orders for hydration.

## 2022-06-13 NOTE — ED Provider Notes (Signed)
Wendy Barber   CSN: [id number] Arrival date & time: 06/13/22  1205     History  Chief Complaint  Patient presents with   Loss of Consciousness    Wendy Barber is a 61 y.o. female.  HPI 61 yo female ho pseudomyxoma peritonei, sp hipec, recent ho gi bleed and anemia, t2dm, presents today with syncope.  Husband reports that patient has been weak with low bp.  Patient had syncopal episode here in the lobby when Wendy Barber went into the restroom.  Husband reports that Wendy Barber has been having generalized weakness and multiple near syncopal and syncopal episodes at home. Patient was discharged August 12.  Review of discharge diagnosis Barber syncope, hypertension, type 2 diabetes, iron deficiency anemia secondary to chronic blood loss history of pulmonary embolism patient with [place] filter, hypertension.  They noted that her principal diagnosis was syncope secondary to orthostasis with associated AKI.  Patient received 1 unit of packed red blood cells.  Vomited x 3-4 yesterday and continues nauseated and pain in lower abdomen hips to leg and back.  Yesterday had upper abdominal pain then nausea and vomiting.  S/P cholecystectomy, partial lobectomy liver, part of small bowel and spleen.  Home Medications Prior to Admission medications   Medication Sig Start Date End Date Taking? Authorizing Provider  alum & mag hydroxide-simeth (MAALOX/MYLANTA) 200-200-20 MG/5ML suspension Take 15 mLs by mouth every 6 (six) hours as needed for indigestion or heartburn. 05/08/22  Yes Aline August, MD  buPROPion (WELLBUTRIN XL) 150 MG 24 hr tablet Take 1 tablet (150 mg total) by mouth daily. 04/27/22  Yes White, Aaron Edelman A, NP  busPIRone (BUSPAR) 15 MG tablet Take 1 tablet (15 mg total) by mouth 3 (three) times daily. 04/05/22  Yes Lesle Chris A, NP  citalopram (CELEXA) 40 MG tablet Take 1 tablet (40 mg total) by mouth daily. 04/27/22  Yes Elwanda Brooklyn, NP  diazepam (VALIUM) 5  MG tablet Take 1 tablet 30-40 minutes prior to MRI 06/13/22  Yes Jaffe, Adam R, DO  dicyclomine (BENTYL) 20 MG tablet Take 1 tablet (20 mg total) by mouth in the morning, at noon, in the evening, and at bedtime. 05/08/22  Yes Aline August, MD  diphenhydrAMINE (SOMINEX) 25 MG tablet Take 25 mg by mouth daily as needed for itching.   Yes [provider]  diphenoxylate-atropine (LOMOTIL) 2.5-0.025 MG tablet TAKE 2 TABLETS IN THE MORNING, AT NOON, IN THE EVENING AND AT BEDTIME Patient taking differently: Take 2 tablets by mouth 4 (four) times daily. 04/22/22  Yes Ennever, Rudell Cobb, MD  eszopiclone (LUNESTA) 2 MG TABS tablet TAKE 1 TABLET(2 MG) BY MOUTH AT BEDTIME AS NEEDED FOR SLEEP Patient taking differently: Take 2 mg by mouth at bedtime. 06/07/22  Yes White, Aaron Edelman A, NP  famotidine (PEPCID) 20 MG tablet TAKE 2 TABLETS TWICE A DAY Patient taking differently: Take 40 mg by mouth 2 (two) times daily. 03/01/22  Yes Ennever, Rudell Cobb, MD  loperamide (IMODIUM) 2 MG capsule Take 2 capsules (4 mg total) by mouth 4 (four) times daily as needed for diarrhea or loose stools. 05/08/22  Yes Aline August, MD  meclizine (ANTIVERT) 25 MG tablet Take 1 tablet (25 mg total) by mouth 3 (three) times daily as needed for dizziness. Patient taking differently: Take 25 mg by mouth 3 (three) times daily. 05/30/22  Yes [name], MD  ondansetron (ZOFRAN) 8 MG tablet Take 8 mg by mouth every 8 (eight) hours as needed for  nausea. 03/03/21  Yes [provider]  orphenadrine (NORFLEX) 100 MG tablet TAKE 1 TABLET(100 MG) BY MOUTH AT BEDTIME AS NEEDED FOR MUSCLE SPASMS 06/06/22  Yes Ennever, Rudell Cobb, MD  oxyCODONE (ROXICODONE) 5 MG immediate release tablet Take 1 tablet (5 mg total) by mouth every 4 (four) hours as needed for severe pain or breakthrough pain. 06/04/22  Yes Shah, Pratik D, DO  pantoprazole (PROTONIX) 40 MG tablet Take 40 mg by mouth every morning. 01/01/21  Yes [provider]  promethazine  (PHENERGAN) 12.5 MG tablet Take 1 tablet (12.5 mg total) by mouth 2 (two) times daily as needed. Patient taking differently: Take 12.5 mg by mouth 2 (two) times daily as needed for nausea. 04/21/22  Yes [name], MD  propranolol (INDERAL) 20 MG tablet Take 1 tablet (20 mg total) by mouth 2 (two) times daily. 04/21/22  Yes [name], MD  traMADol (ULTRAM) 50 MG tablet Take 1 tablet (50 mg total) by mouth every 6 (six) hours as needed for moderate pain. 05/08/22  Yes Aline August, MD  ferrous sulfate 325 (65 FE) MG tablet Take 1 tablet (325 mg total) by mouth daily. Patient not taking: Reported on 06/13/2022 06/03/22 06/03/23  Heath Lark D, DO  folic acid (FOLVITE) 1 MG tablet Take 2 tablets (2 mg total) by mouth daily. Patient not taking: Reported on 06/13/2022 05/09/22   Aline August, MD  hydrocortisone (ANUSOL-HC) 2.5 % rectal cream Place rectally 2 (two) times daily. Patient not taking: Reported on 06/13/2022 05/08/22   Aline August, MD  potassium chloride SA (KLOR-CON M) 10 MEQ tablet Take 2 tablets (20 mEq total) by mouth daily. Patient not taking: Reported on 06/13/2022 05/12/22   [name], MD      Allergies    Penicillins, Alprazolam, Ativan [lorazepam], Corticosteroids, Erythromycin, Prednisone, Savella  [milnacipran], and Prednisolone    Review of Systems   Review of Systems  Physical Exam Updated Vital Signs BP 123/78   Pulse 74   Temp 100 F (37.8 C) (Oral)   Resp 16   SpO2 100%  Physical Exam Vitals and nursing Barber reviewed.  Constitutional:      General: Wendy Barber is in acute distress.     Appearance: Wendy Barber is ill-appearing.  HENT:     Head: Normocephalic.     Right Ear: External ear normal.     Left Ear: External ear normal.     Nose: Nose normal.     Mouth/Throat:     Mouth: Mucous membranes are dry.  Eyes:     Pupils: Pupils are equal, round, and reactive to light.     Comments: Conjunctive are pale  Cardiovascular:     Rate and Rhythm: Normal  rate and regular rhythm.     Pulses: Normal pulses.  Pulmonary:     Effort: Pulmonary effort is normal.     Breath sounds: Normal breath sounds.  Abdominal:     General: Abdomen is flat.     Palpations: Abdomen is soft.  Musculoskeletal:        General: Normal range of motion.     Cervical back: Normal range of motion.  Skin:    General: Skin is warm.     Capillary Refill: Capillary refill takes less than 2 seconds.     Coloration: Skin is pale.  Neurological:     General: No focal deficit present.  Psychiatric:        Mood and Affect: Mood normal.  ED Results / Procedures / Treatments   Labs (all labs ordered are listed, but only abnormal results are displayed) Labs Reviewed  CBC - Abnormal; Notable for the following components:      Result Value   WBC 15.0 (*)    RBC 3.08 (*)    Hemoglobin 8.8 (*)    HCT 29.0 (*)    RDW 19.3 (*)    All other components within normal limits  COMPREHENSIVE METABOLIC PANEL - Abnormal; Notable for the following components:   CO2 19 (*)    Glucose, Bld 148 (*)    BUN 42 (*)    Creatinine, Ser 2.03 (*)    Total Protein 8.3 (*)    Alkaline Phosphatase 154 (*)    Total Bilirubin <0.1 (*)    GFR, Estimated 28 (*)    All other components within normal limits  URINALYSIS, ROUTINE W REFLEX MICROSCOPIC  POC OCCULT BLOOD, ED  TYPE AND SCREEN  TROPONIN I (HIGH SENSITIVITY)  TROPONIN I (HIGH SENSITIVITY)    EKG EKG Interpretation  Date/Time:  Monday June 13 2022 14:02:11 EDT Ventricular Rate:  74 PR Interval:  163 QRS Duration: 105 QT Interval:  405 QTC Calculation: 450 R Axis:   -2 Text Interpretation: Sinus rhythm RSR' in V1 or V2, right VCD or RVH Repol abnrm suggests ischemia, lateral leads Confirmed by Pattricia Boss [phone]) on 06/13/2022 3:12:11 PM  Radiology CT ABDOMEN PELVIS WO CONTRAST  Result Date: 06/13/2022 CLINICAL DATA:  Weakness and near-syncope. Bilateral hip pain. History of appendiceal cancer with metastasis.  Pseudomyxoma peritonei. History of pulmonary emboli and deep venous thrombosis. * Tracking Code: BO * EXAM: CT ABDOMEN AND PELVIS WITHOUT CONTRAST TECHNIQUE: Multidetector CT imaging of the abdomen and pelvis was performed following the standard protocol without IV contrast. RADIATION DOSE REDUCTION: This exam was performed according to the departmental dose-optimization program which includes automated exposure control, adjustment of the mA and/or kV according to patient size and/or use of iterative reconstruction technique. COMPARISON:  03/23/2022 PET. 09/29/2021 contrast-enhanced chest abdomen and pelvic CT. FINDINGS: Lower chest: Mild bibasilar scarring. Normal heart size without pericardial or pleural effusion. Lad coronary artery calcification. Hepatobiliary: Advanced cirrhosis, as evidenced by an irregular hepatic capsule. Limited sensitivity for focal liver lesion due to lack of IV contrast. Cholecystectomy, without biliary ductal dilatation. Pancreas: Moderate pancreatic atrophy with fatty replacement in the head and uncinate process. Spleen: Splenectomy. Adrenals/Urinary Tract: Normal adrenal glands. No renal calculi or hydronephrosis. Decompressed urinary bladder, without bladder calculi. The ureters are somewhat challenging to follow, but no ureteric stones are seen. Stomach/Bowel: Normal stomach, without wall thickening. Surgical sutures at the rectosigmoid junction. Right hemicolectomy. Concurrent right pelvic enterotomy. Bowel is poorly evaluated secondary to lack of oral or IV contrast. Anterior position of bowel loops is relatively similar and suggests adhesions. Vascular/Lymphatic: Aortic atherosclerosis. IVC filter is appropriately positioned, just below the renal veins. Edema is identified about both pelvic and groin venous systems, including on 72 and 73 of series 2. Subtle hypoattenuation within the left external iliac vein including on 66/2. No abdominopelvic adenopathy. Reproductive:  Hysterectomy.  No adnexal mass. Other: No significant free fluid. No free intraperitoneal air. No convincing evidence of peritoneal metastasis. Musculoskeletal: L4-5 trans pedicle screw fixation. Moderate convex left lumbar spine curvature. IMPRESSION: 1. Perivascular edema within both groins and superficial pelvic sidewalls. Suspect bilateral pelvic and femoral venous thrombosis, suboptimally evaluated on this noncontrast exam. IVC filter in place. Ultrasound or dedicated CT venography versus MR venography could confirm. 2.  Otherwise, low sensitivity exam secondary to lack of oral or IV contrast. 3. Extensive surgical changes throughout the abdomen, without specific evidence of metastatic disease. 4. Cirrhosis 5. Age advanced coronary artery atherosclerosis. Recommend assessment of coronary risk factors. 6.  Aortic Atherosclerosis (ICD10-I70.0). Electronically Signed   By: Abigail Miyamoto M.D.   On: 06/13/2022 16:38   CT L-SPINE NO CHARGE  Result Date: 06/13/2022 CLINICAL DATA:  Generalized weakness. Bilateral hip pain. History of metastatic disease EXAM: CT Lumbar Spine without contrast TECHNIQUE: Technique: Multiplanar CT images of the lumbar spine were reconstructed from contemporary CT of the Abdomen and Pelvis. RADIATION DOSE REDUCTION: This exam was performed according to the departmental dose-optimization program which includes automated exposure control, adjustment of the mA and/or kV according to patient size and/or use of iterative reconstruction technique. CONTRAST:  None COMPARISON:  Abdominal CT 09/29/2021 FINDINGS: Segmentation: 5 lumbar type vertebrae based on the available coverage. L5 is incompletely segmented from the sacrum at the left transverse process Alignment: Levoscoliosis. Vertebrae: No evidence of acute fracture or aggressive bone lesion. Paraspinal and other soft tissues: The IVC and left more than right iliac veins appear high-density and expanded with retroperitoneal edema. Partially  covered liver shows lobulation from cirrhosis. Disc levels: T12- L1: Unremarkable. L1-L2: Mild disc narrowing L2-L3: Degenerative facet spurring and ligamentum flavum thickening. Disc space narrowing and bulging. Spinal stenosis is at least moderate. Moderate bilateral foraminal narrowing L3-L4: Disc collapse with endplate and facet spurring. At least moderate spinal stenosis. Biforaminal impingement L4-L5: PLIF with solid arthrodesis. Decompression with patent appearance of the canal. Mild to moderate left foraminal narrowing L5-S1:Mild spurring with incomplete segmentation. No bony impingement. IMPRESSION: 1. Dominant findings in the retroperitoneum where there is evidence of confluent thrombus in the iliac veins and IVC below a filter. 2. No acute finding in the lumbar spine. 3. Lumbar spine degeneration and scoliosis with moderate spinal stenosis at L2-3 and L3-4. Electronically Signed   By: Jorje Guild M.D.   On: 06/13/2022 16:25    Procedures .Critical Care  Performed by: Pattricia Boss, MD Authorized by: Pattricia Boss, MD   Critical care provider statement:    Critical care time (minutes):  45   Critical care was time spent personally by me on the following activities:  Development of treatment plan with patient or surrogate, discussions with consultants, evaluation of patient's response to treatment, examination of patient, ordering and review of laboratory studies, ordering and review of radiographic studies, ordering and performing treatments and interventions, pulse oximetry, re-evaluation of patient's condition and review of old charts     Medications Ordered in ED Medications  sodium chloride flush (NS) 0.9 % injection 3 mL (3 mLs Intravenous Given 06/13/22 1533)  acetaminophen (TYLENOL) tablet 650 mg (650 mg Oral Given 06/13/22 1712)    Or  acetaminophen (TYLENOL) suppository 650 mg ( Rectal See Alternative 06/13/22 1712)  ondansetron (ZOFRAN) tablet 4 mg (has no administration in  time range)    Or  ondansetron (ZOFRAN) injection 4 mg (has no administration in time range)  lactated ringers bolus 1,000 mL (has no administration in time range)  lactated ringers bolus 500 mL (0 mLs Intravenous Stopped 06/13/22 1403)  lactated ringers bolus 500 mL (500 mLs Intravenous New Bag/Given 06/13/22 1350)    ED Course/ Medical Decision Making/ A&P Clinical Course as of 06/13/22 1716  Mon Jun 13, 2022  1508 CBC reviewed and interpreted significant for leukocytosis with white blood cell count of 15,000 Hemoglobin is stable at 8.8 Complete  metabolic panel reviewed interpreted and significant for BUN elevated at 42 and creatinine at 2.0 which is new from prior [DR]    Clinical Course User Index [DR] Pattricia Boss, MD                           Medical Decision Making 61 year old female history of pseudomyxoma peritonei, recent GI bleed, [place]entz today with syncope. Patient has not been taking p.o. well and has had some abdominal pain and vomiting Patient worked up here with labs and hemoglobin appears stable. New elevated creatinine Patient receiving IV fluids as suspect volume depletion as cause for her newly elevated creatinine I suspect her volume depletion and is a vagal etiology of her syncope Wendy Barber has had this history of pseudomyxoma and has had increasing lower abdominal pain.  Wendy Barber will benefit from imaging.  Plan to decide on imaging and consultation with hospitalist as Wendy Barber would likely benefit from contrast but will not be able to have contrast with her acute kidney injury Additionally Wendy Barber is having pain in her pelvis and back will order CT of her pelvis, hips, and back. EKG with nsst changes- will add troponin, no chest pain Discussed with Dr.Ortiz who will see for admission 1- syncope 2- aki 3- abdominal pain with associated nausea and vomiting likely contributing to #2 4 Patient complaining of pelvic pain with radiation to hips and low back CT obtained secondary to #3   5-perivascular edema within both groins suspect bilateral pelvic and bilateral femoral venous thrombosis suboptimally evaluated.  IVC filter in place.  Patient has been off her anticoagulants due to GI bleed.  Wendy Barber does have IVC filter in place 6- ekg with nsst changes- troponin ordered but was not drawn prior to admit.     Amount and/or Complexity of Data Reviewed Independent Historian: spouse External Data Reviewed: notes. Labs: ordered. Decision-making details documented in ED Course. Radiology: ordered and independent interpretation performed. Decision-making details documented in ED Course. ECG/medicine tests: ordered and independent interpretation performed. Decision-making details documented in ED Course. Discussion of management or test interpretation with external provider(s): Dr. Olevia Bowens  Risk Decision regarding hospitalization.           Final Clinical Impression(s) / ED Diagnoses Final diagnoses:  Syncope, unspecified syncope type    Rx / DC Orders ED Discharge Orders     None         Pattricia Boss, MD 06/13/22 [phone]

## 2022-06-13 NOTE — H&P (Addendum)
History and Physical    Patient: Wendy Barber WUJ:[id number] DOB: 05-18-1961 DOA: 06/13/2022 DOS: the patient was seen and examined on 06/13/2022 PCP: [name], MD  Patient coming from: Home  Chief Complaint:  Chief Complaint  Patient presents with   Loss of Consciousness   HPI: Wendy Barber is a 61 y.o. 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  She also had a syncopal episode this morning while on the toilet in the emergency department.  She lost consciousness for maybe 30 to 45 seconds.  She felt that everything was blacking out before passing out.  She has had some dyspnea, dizziness, but no chest pain, palpitations, PND orthopnea.  She occasionally gets lower extremity edema.  She had a low-grade temperature earlier, but no rhinorrhea, sore throat or clinically significant productive cough.  She has chronic abdominal pain and diarrhea 6-8 times daily at least.  No melena, hematochezia or constipation recently.  No flank pain, dysuria, frequency or hematuria.  No polyuria, polydipsia, polyphagia or blurred vision.  ED course: Initial vital signs were temperature 99.6 F, pulse 73, respiration 19, BP 128/91 mmHg.  The patient received 2 500 mL NS bolus in the emergency department.  Lab work: CBC is her white count 15.0, hemoglobin 8.8 g/dL platelets 185.  CMP showed a CO2 of 19 mmol/L with a normal anion gap, the rest of the electrolytes were normal.  Glucose 148, BUN 42 and creatinine 2.03 mg/dL.  Her baseline creatinine is usually under 1.  LFTs show total protein of 8.3 g/dL and alkaline phosphatase 154  units/L.  Total bilirubin less than 0.1 mg/dL.  The rest of the LFTs were normal.  Imaging: CT abdomen/pelvis with suspicions for bilateral pelvic and femoral venous thrombosis.  There was extensive surgical changes throughout the abdomen without specific evidence of metastatic disease.  There is liver cirrhosis.  There is advanced for age CAD and aortic atherosclerosis.   Review of Systems: As mentioned in the history of present illness. All other systems reviewed and are negative.  Past Medical History:  Diagnosis Date   Arthritis    Back pain    Cancer [facility])    pseudomyxoma peritonei   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[place]) 03/19/2020   Chronic fatigue syndrome    Diabetes mellitus without complication (HCC)    DVT of deep femoral vein, left ([place]) 03/19/2020   Fibromyalgia    Goals of care, counseling/discussion 03/19/2020   Hypertension    Iron deficiency anemia due to chronic blood loss 05/14/2021   Malignant pseudomyxoma peritonei ([place]) 03/19/2020   Pernicious anemia 05/14/2021   Presence of IVC filter 03/19/2020   Pulmonary embolism, bilateral ([place]) 03/19/2020   Past Surgical History:  Procedure Laterality Date   ABDOMINAL HYSTERECTOMY     ABDOMINAL SURGERY     ACHILLES TENDON REPAIR     APPENDECTOMY     ARTHROPLASTY     CARPAL TUNNEL RELEASE     CHOLECYSTECTOMY     IR CATHETER TUBE CHANGE  02/02/2021   IR CATHETER TUBE CHANGE  02/25/2021   IR RADIOLOGIST EVAL & MGMT  02/24/2021   IR RADIOLOGIST EVAL & MGMT  03/10/2021   IR  US GUIDE BX ASP/DRAIN  11/25/2019   JOINT REPLACEMENT     KNEE ARTHROSCOPY     ORIF ANKLE FRACTURE Right 08/27/2019   Procedure: OPEN REDUCTION INTERNAL FIXATION RIGHT ANKLE FRACTURE;  Surgeon: Renette Butters, MD;  Location: [facility];  Service: Orthopedics;  Laterality: Right;   perineorrophy     TONGUE BIOPSY     Social History:  reports that she has never smoked. She has never used smokeless tobacco. She reports that she  does not drink alcohol and does not use drugs.  Allergies  Allergen Reactions   Penicillins Shortness Of Breath    Other reaction(s): Irregular Heart Rate, Other (See Comments) Rapid heartrate    Alprazolam Hives and Other (See Comments)    Hard to arouse, unresponsiveness   Ativan [Lorazepam] Other (See Comments)    Note: tolerates midazolam fine Face & Throat Swelling.   Corticosteroids Other (See Comments)    Other reaction(s): Other (see comments) Psychotic behaviour     Erythromycin     Other reaction(s): Other (See Comments) Severe stomach pain    Prednisone Other (See Comments)    Anxiety & Nervous Breakdown.   Savella  [Milnacipran]    Prednisolone Anxiety    Family History  Problem Relation Age of Onset   Depression Mother    Drug abuse Sister    Suicidality Sister    Alcohol abuse Brother    Drug abuse Brother     Prior to Admission medications   Medication Sig Start Date End Date Taking? Authorizing Provider  alum & mag hydroxide-simeth (MAALOX/MYLANTA) 200-200-20 MG/5ML suspension Take 15 mLs by mouth every 6 (six) hours as needed for indigestion or heartburn. 05/08/22  Yes Aline August, MD  buPROPion (WELLBUTRIN XL) 150 MG 24 hr tablet Take 1 tablet (150 mg total) by mouth daily. 04/27/22  Yes White, Aaron Edelman A, NP  busPIRone (BUSPAR) 15 MG tablet Take 1 tablet (15 mg total) by mouth 3 (three) times daily. 04/05/22  Yes Lesle Chris A, NP  citalopram (CELEXA) 40 MG tablet Take 1 tablet (40 mg total) by mouth daily. 04/27/22  Yes Elwanda Brooklyn, NP  diazepam (VALIUM) 5 MG tablet Take 1 tablet 30-40 minutes prior to MRI 06/13/22  Yes Jaffe, Adam R, DO  dicyclomine (BENTYL) 20 MG tablet Take 1 tablet (20 mg total) by mouth in the morning, at noon, in the evening, and at bedtime. 05/08/22  Yes Aline August, MD  diphenhydrAMINE (SOMINEX) 25 MG tablet Take 25 mg by mouth daily as needed for itching.   Yes [provider]  diphenoxylate-atropine (LOMOTIL)  2.5-0.025 MG tablet TAKE 2 TABLETS IN THE MORNING, AT NOON, IN THE EVENING AND AT BEDTIME Patient taking differently: Take 2 tablets by mouth 4 (four) times daily. 04/22/22  Yes Ennever, Rudell Cobb, MD  eszopiclone (LUNESTA) 2 MG TABS tablet TAKE 1 TABLET(2 MG) BY MOUTH AT BEDTIME AS NEEDED FOR SLEEP Patient taking differently: Take 2 mg by mouth at bedtime. 06/07/22  Yes White, Aaron Edelman A, NP  famotidine (PEPCID) 20 MG tablet TAKE 2 TABLETS TWICE A DAY Patient taking differently: Take 40 mg by mouth 2 (two) times daily. 03/01/22  Yes Ennever, Rudell Cobb, MD  loperamide (IMODIUM) 2 MG capsule Take 2 capsules (4 mg total) by mouth 4 (four) times daily as needed for diarrhea or loose stools. 05/08/22  Yes Aline August, MD  meclizine (ANTIVERT) 25 MG tablet Take 1 tablet (25 mg total) by mouth 3 (three) times daily as needed for  dizziness. Patient taking differently: Take 25 mg by mouth 3 (three) times daily. 05/30/22  Yes [name], MD  ondansetron (ZOFRAN) 8 MG tablet Take 8 mg by mouth every 8 (eight) hours as needed for nausea. 03/03/21  Yes [provider]  orphenadrine (NORFLEX) 100 MG tablet TAKE 1 TABLET(100 MG) BY MOUTH AT BEDTIME AS NEEDED FOR MUSCLE SPASMS 06/06/22  Yes Ennever, Rudell Cobb, MD  oxyCODONE (ROXICODONE) 5 MG immediate release tablet Take 1 tablet (5 mg total) by mouth every 4 (four) hours as needed for severe pain or breakthrough pain. 06/04/22  Yes Shah, Pratik D, DO  pantoprazole (PROTONIX) 40 MG tablet Take 40 mg by mouth every morning. 01/01/21  Yes [provider]  promethazine (PHENERGAN) 12.5 MG tablet Take 1 tablet (12.5 mg total) by mouth 2 (two) times daily as needed. Patient taking differently: Take 12.5 mg by mouth 2 (two) times daily as needed for nausea. 04/21/22  Yes [name], MD  propranolol (INDERAL) 20 MG tablet Take 1 tablet (20 mg total) by mouth 2 (two) times daily. 04/21/22  Yes [name], MD  traMADol (ULTRAM) 50 MG tablet Take 1 tablet  (50 mg total) by mouth every 6 (six) hours as needed for moderate pain. 05/08/22  Yes Aline August, MD  ferrous sulfate 325 (65 FE) MG tablet Take 1 tablet (325 mg total) by mouth daily. Patient not taking: Reported on 06/13/2022 06/03/22 06/03/23  Heath Lark D, DO  folic acid (FOLVITE) 1 MG tablet Take 2 tablets (2 mg total) by mouth daily. Patient not taking: Reported on 06/13/2022 05/09/22   Aline August, MD  hydrocortisone (ANUSOL-HC) 2.5 % rectal cream Place rectally 2 (two) times daily. Patient not taking: Reported on 06/13/2022 05/08/22   Aline August, MD  potassium chloride SA (KLOR-CON M) 10 MEQ tablet Take 2 tablets (20 mEq total) by mouth daily. Patient not taking: Reported on 06/13/2022 05/12/22   [name], MD    Physical Exam: Vitals:   06/13/22 1631 06/13/22 1633 06/13/22 1641 06/13/22 1644  BP: 131/81 (!) 69/40 (!) 77/64 123/78  Pulse: 88 (!) 37 74 74  Resp:      Temp:      TempSrc:      SpO2: 100%  100%    Physical Exam Vitals and nursing note reviewed.  Constitutional:      General: She is awake.     Appearance: Normal appearance. She is obese. She is not ill-appearing.     Comments: Chronically ill appearing.  HENT:     Head: Normocephalic.     Nose: No rhinorrhea.     Mouth/Throat:     Mouth: Mucous membranes are dry.  Eyes:     General: No scleral icterus.    Pupils: Pupils are equal, round, and reactive to light.  Neck:     Vascular: No JVD.  Cardiovascular:     Rate and Rhythm: Normal rate and regular rhythm.     Heart sounds: S1 normal and S2 normal.  Pulmonary:     Breath sounds: Normal breath sounds. No wheezing, rhonchi or rales.  Abdominal:     General: Abdomen is protuberant. A surgical scar is present. Bowel sounds are normal. There is no distension.     Palpations: Abdomen is soft.     Tenderness: There is abdominal tenderness in the epigastric area. There is no right CVA tenderness, left CVA tenderness, guarding or rebound.   Musculoskeletal:     Cervical back:  Neck supple.     Right lower leg: No edema.     Left lower leg: No edema.  Skin:    General: Skin is warm and dry.  Neurological:     General: No focal deficit present.     Mental Status: She is alert and oriented to person, place, and time.  Psychiatric:        Mood and Affect: Mood normal.        Behavior: Behavior normal. Behavior is cooperative.   Data Reviewed:  There are no new results to review at this time.  Assessment and Plan: Principal Problem:   Syncope In the setting of:   Hypotension And   Symptomatic bradycardia Observation/telemetry. Had echocardiogram earlier this month. LR 1 L bolus. LR at 125 mL/h. Hold propranolol. Fall precautions. OOB with assistance.  Active Problems:   AKI (acute kidney injury) ([place]) Continue IV fluids. Avoid hypotension. Avoid nephrotoxins. Monitor intake and output. Monitor renal function electrolytes.    Bilateral iliac artery thrombosis (HCC) High risk of bleeding discussed with the patient. Agreed to heparin per pharmacy. Pharmacy will have low heparin goal. Consult Dr. [name] or oncology on-call tomorrow.    HTN (hypertension) As needed hydralazine IVP.    DM2 (diabetes mellitus, type 2) (HCC) Last hemoglobin A1c 6.5%. Carbohydrate modified diet. CBG monitoring before meals and bedtime.    Iron deficiency anemia due to chronic blood loss Monitor hematocrit and hemoglobin.    Anxiety Continue escitalopram 40 mg p.o. daily. Continue buspirone 50 mg p.o. 3 times daily.    CAD (coronary artery disease)   Aortic atherosclerosis (HCC) Check fasting lipids. Cardiology evaluation as an outpatient.    Advance Care Planning:   Code Status: Full Code   Consults:   Family Communication:   Severity of Illness: The appropriate patient status for this patient is OBSERVATION. Observation status is judged to be reasonable and necessary in order to provide the required  intensity of service to ensure the patient's safety. The patient's presenting symptoms, physical exam findings, and initial radiographic and laboratory data in the context of their medical condition is felt to place them at decreased risk for further clinical deterioration. Furthermore, it is anticipated that the patient will be medically stable for discharge from the hospital within 2 midnights of admission.   Author: Reubin Milan, MD 06/13/2022 5:21 PM  For on call review [email or web address].   This document was prepared using Dragon voice recognition software and may contain some unintended transcription errors.

## 2022-06-13 NOTE — Progress Notes (Addendum)
ANTICOAGULATION CONSULT NOTE - Initial Consult  Pharmacy Consult for heparin Indication: VTE treatment  Allergies  Allergen Reactions   Penicillins Shortness Of Breath    Other reaction(s): Irregular Heart Rate, Other (See Comments) Rapid heartrate    Alprazolam Hives and Other (See Comments)    Hard to arouse, unresponsiveness   Ativan [Lorazepam] Other (See Comments)    Note: tolerates midazolam fine Face & Throat Swelling.   Corticosteroids Other (See Comments)    Other reaction(s): Other (see comments) Psychotic behaviour     Erythromycin     Other reaction(s): Other (See Comments) Severe stomach pain    Prednisone Other (See Comments)    Anxiety & Nervous Breakdown.   Savella  [Milnacipran]    Prednisolone Anxiety    Patient Measurements: Height: '5\' 6"'$  (167.6 cm) Weight: 109.3 kg (240 lb 15.4 oz) IBW/kg (Calculated) : 59.3 Heparin Dosing Weight: 84.7 kg  Vital Signs: Temp: 100 F (37.8 C) (08/21 1630) Temp Source: Oral (08/21 1630) BP: 114/72 (08/21 1849) Pulse Rate: 74 (08/21 1644)  Labs: Recent Labs    06/13/22 1240 06/13/22 1739  HGB 8.8*  --   HCT 29.0*  --   PLT 185  --   CREATININE 2.03*  --   TROPONINIHS  --  4    Estimated Creatinine Clearance: 37.4 mL/min (A) (by C-G formula based on SCr of 2.03 mg/dL (H)).   Medical History: Past Medical History:  Diagnosis Date   Arthritis    Back pain    Cancer [facility])    pseudomyxoma peritonei   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[place]) 03/19/2020   Chronic fatigue syndrome    Diabetes mellitus without complication (HCC)    DVT of deep femoral vein, left ([place]) 03/19/2020   Fibromyalgia    Goals of care, counseling/discussion 03/19/2020   Hypertension    Iron deficiency anemia due to chronic blood loss 05/14/2021   Malignant pseudomyxoma peritonei ([place]) 03/19/2020   Pernicious anemia 05/14/2021   Presence of IVC filter 03/19/2020   Pulmonary embolism, bilateral ([place])  03/19/2020    Medications:  Scheduled:   [START ON 06/14/2022] buPROPion  150 mg Oral Daily   busPIRone  15 mg Oral TID   [START ON 06/14/2022] citalopram  40 mg Oral Daily   dicyclomine  20 mg Oral TID AC & HS   diphenoxylate-atropine  2 tablet Oral QID   famotidine  20 mg Oral BID    HYDROmorphone (DILAUDID) injection  1 mg Intravenous Once   meclizine  25 mg Oral TID   ondansetron (ZOFRAN) IV  4 mg Intravenous Once   [START ON 06/14/2022] pantoprazole  40 mg Oral q morning   sodium chloride flush  3 mL Intravenous Q12H    Assessment: 61yo female presenting to [facility] with loss of consciousness. PMH significant for pulmonary embolism previously on Xarelto and anemia. Xarelto discontinued back in July 2023 due to GIB; patient also has an IVC filter in place. Discussed with MD, CT scan suspicious for new bilateral pelvic and femoral VTE and he discussed the risk/benefits of anticoagulation- patient agrees to proceed. Patient denies current s/sx bleeding (hematochezia, hematuria). Pharmacy has been consulted for IV heparin dosing.   Baseline aPTT and INR ordered. Hemoglobin 8.8 (appears near baseline), platelets 185.  Goal of Therapy:  Heparin level 0.3-0.5 units/ml given recent GIB Monitor platelets by anticoagulation protocol: Yes   Plan:  Small heparin bolus of 2000 units IV x1 Begin heparin infusion at 1400 units/hr Check  heparin level in 8 hours F/u LE dopplers Monitor for s/sx bleeding and ability to transition to PO anticoagulation  Dimple Nanas, PharmD, BCPS 06/13/2022 6:52 PM

## 2022-06-14 ENCOUNTER — Observation Stay (HOSPITAL_COMMUNITY): Payer: Medicare Other

## 2022-06-14 DIAGNOSIS — N179 Acute kidney failure, unspecified: Secondary | ICD-10-CM | POA: Diagnosis present

## 2022-06-14 DIAGNOSIS — R52 Pain, unspecified: Secondary | ICD-10-CM | POA: Diagnosis not present

## 2022-06-14 DIAGNOSIS — I82422 Acute embolism and thrombosis of left iliac vein: Secondary | ICD-10-CM | POA: Diagnosis present

## 2022-06-14 DIAGNOSIS — I82403 Acute embolism and thrombosis of unspecified deep veins of lower extremity, bilateral: Secondary | ICD-10-CM | POA: Diagnosis present

## 2022-06-14 DIAGNOSIS — K7460 Unspecified cirrhosis of liver: Secondary | ICD-10-CM | POA: Diagnosis present

## 2022-06-14 DIAGNOSIS — C182 Malignant neoplasm of ascending colon: Secondary | ICD-10-CM | POA: Diagnosis not present

## 2022-06-14 DIAGNOSIS — G8929 Other chronic pain: Secondary | ICD-10-CM | POA: Diagnosis not present

## 2022-06-14 DIAGNOSIS — R109 Unspecified abdominal pain: Secondary | ICD-10-CM

## 2022-06-14 DIAGNOSIS — M79669 Pain in unspecified lower leg: Secondary | ICD-10-CM

## 2022-06-14 DIAGNOSIS — I700 Atherosclerosis of aorta: Secondary | ICD-10-CM | POA: Diagnosis present

## 2022-06-14 DIAGNOSIS — I82813 Embolism and thrombosis of superficial veins of lower extremities, bilateral: Secondary | ICD-10-CM | POA: Diagnosis present

## 2022-06-14 DIAGNOSIS — I10 Essential (primary) hypertension: Secondary | ICD-10-CM | POA: Diagnosis present

## 2022-06-14 DIAGNOSIS — R197 Diarrhea, unspecified: Secondary | ICD-10-CM

## 2022-06-14 DIAGNOSIS — C181 Malignant neoplasm of appendix: Secondary | ICD-10-CM | POA: Diagnosis not present

## 2022-06-14 DIAGNOSIS — N1330 Unspecified hydronephrosis: Secondary | ICD-10-CM | POA: Diagnosis not present

## 2022-06-14 DIAGNOSIS — I82463 Acute embolism and thrombosis of calf muscular vein, bilateral: Secondary | ICD-10-CM | POA: Diagnosis present

## 2022-06-14 DIAGNOSIS — I82220 Acute embolism and thrombosis of inferior vena cava: Secondary | ICD-10-CM | POA: Diagnosis present

## 2022-06-14 DIAGNOSIS — F419 Anxiety disorder, unspecified: Secondary | ICD-10-CM | POA: Diagnosis not present

## 2022-06-14 DIAGNOSIS — I82411 Acute embolism and thrombosis of right femoral vein: Secondary | ICD-10-CM | POA: Diagnosis present

## 2022-06-14 DIAGNOSIS — K922 Gastrointestinal hemorrhage, unspecified: Secondary | ICD-10-CM | POA: Diagnosis not present

## 2022-06-14 DIAGNOSIS — D500 Iron deficiency anemia secondary to blood loss (chronic): Secondary | ICD-10-CM | POA: Diagnosis present

## 2022-06-14 DIAGNOSIS — Y712 Prosthetic and other implants, materials and accessory cardiovascular devices associated with adverse incidents: Secondary | ICD-10-CM | POA: Diagnosis present

## 2022-06-14 DIAGNOSIS — F32A Depression, unspecified: Secondary | ICD-10-CM | POA: Diagnosis present

## 2022-06-14 DIAGNOSIS — E1165 Type 2 diabetes mellitus with hyperglycemia: Secondary | ICD-10-CM | POA: Diagnosis present

## 2022-06-14 DIAGNOSIS — M7989 Other specified soft tissue disorders: Secondary | ICD-10-CM

## 2022-06-14 DIAGNOSIS — R55 Syncope and collapse: Secondary | ICD-10-CM | POA: Diagnosis not present

## 2022-06-14 DIAGNOSIS — B9620 Unspecified Escherichia coli [E. coli] as the cause of diseases classified elsewhere: Secondary | ICD-10-CM | POA: Diagnosis present

## 2022-06-14 DIAGNOSIS — I82433 Acute embolism and thrombosis of popliteal vein, bilateral: Secondary | ICD-10-CM | POA: Diagnosis present

## 2022-06-14 DIAGNOSIS — T82868A Thrombosis of vascular prosthetic devices, implants and grafts, initial encounter: Secondary | ICD-10-CM | POA: Diagnosis present

## 2022-06-14 DIAGNOSIS — D6859 Other primary thrombophilia: Secondary | ICD-10-CM | POA: Diagnosis present

## 2022-06-14 DIAGNOSIS — K754 Autoimmune hepatitis: Secondary | ICD-10-CM | POA: Diagnosis present

## 2022-06-14 DIAGNOSIS — Z8509 Personal history of malignant neoplasm of other digestive organs: Secondary | ICD-10-CM

## 2022-06-14 DIAGNOSIS — I82412 Acute embolism and thrombosis of left femoral vein: Secondary | ICD-10-CM | POA: Diagnosis not present

## 2022-06-14 DIAGNOSIS — D320 Benign neoplasm of cerebral meninges: Secondary | ICD-10-CM | POA: Diagnosis present

## 2022-06-14 DIAGNOSIS — I824Y3 Acute embolism and thrombosis of unspecified deep veins of proximal lower extremity, bilateral: Secondary | ICD-10-CM | POA: Diagnosis not present

## 2022-06-14 DIAGNOSIS — N390 Urinary tract infection, site not specified: Secondary | ICD-10-CM | POA: Diagnosis present

## 2022-06-14 DIAGNOSIS — C772 Secondary and unspecified malignant neoplasm of intra-abdominal lymph nodes: Secondary | ICD-10-CM | POA: Diagnosis present

## 2022-06-14 DIAGNOSIS — I82413 Acute embolism and thrombosis of femoral vein, bilateral: Secondary | ICD-10-CM | POA: Diagnosis present

## 2022-06-14 DIAGNOSIS — C786 Secondary malignant neoplasm of retroperitoneum and peritoneum: Secondary | ICD-10-CM | POA: Diagnosis present

## 2022-06-14 DIAGNOSIS — Z6841 Body Mass Index (BMI) 40.0 and over, adult: Secondary | ICD-10-CM | POA: Diagnosis not present

## 2022-06-14 LAB — COMPREHENSIVE METABOLIC PANEL
ALT: 14 U/L (ref 0–44)
AST: 19 U/L (ref 15–41)
Albumin: 3 g/dL — ABNORMAL LOW (ref 3.5–5.0)
Alkaline Phosphatase: 119 U/L (ref 38–126)
Anion gap: 8 (ref 5–15)
BUN: 40 mg/dL — ABNORMAL HIGH (ref 6–20)
CO2: 22 mmol/L (ref 22–32)
Calcium: 8.7 mg/dL — ABNORMAL LOW (ref 8.9–10.3)
Chloride: 105 mmol/L (ref 98–111)
Creatinine, Ser: 1.88 mg/dL — ABNORMAL HIGH (ref 0.44–1.00)
GFR, Estimated: 30 mL/min — ABNORMAL LOW (ref >60)
Glucose, Bld: 160 mg/dL — ABNORMAL HIGH (ref 70–99)
Potassium: 3.4 mmol/L — ABNORMAL LOW (ref 3.5–5.1)
Sodium: 135 mmol/L (ref 135–145)
Total Bilirubin: 0.5 mg/dL (ref 0.3–1.2)
Total Protein: 6.7 g/dL (ref 6.5–8.1)

## 2022-06-14 LAB — CBC
HCT: 24 % — ABNORMAL LOW (ref 36.0–46.0)
Hemoglobin: 7.3 g/dL — ABNORMAL LOW (ref 12.0–15.0)
MCH: 28.6 pg (ref 26.0–34.0)
MCHC: 30.4 g/dL (ref 30.0–36.0)
MCV: 94.1 fL (ref 80.0–100.0)
Platelets: 211 10*3/uL (ref 150–400)
RBC: 2.55 MIL/uL — ABNORMAL LOW (ref 3.87–5.11)
RDW: 18.7 % — ABNORMAL HIGH (ref 11.5–15.5)
WBC: 11.6 10*3/uL — ABNORMAL HIGH (ref 4.0–10.5)
nRBC: 0.3 % — ABNORMAL HIGH (ref 0.0–0.2)

## 2022-06-14 LAB — GLUCOSE, CAPILLARY
Glucose-Capillary: 132 mg/dL — ABNORMAL HIGH (ref 70–99)
Glucose-Capillary: 156 mg/dL — ABNORMAL HIGH (ref 70–99)
Glucose-Capillary: 228 mg/dL — ABNORMAL HIGH (ref 70–99)
Glucose-Capillary: 542 mg/dL (ref 70–99)
Glucose-Capillary: 146 mg/dL — ABNORMAL HIGH (ref 70–99)
Glucose-Capillary: 143 mg/dL — ABNORMAL HIGH (ref 70–99)

## 2022-06-14 LAB — IRON AND TIBC
Iron: 25 ug/dL — ABNORMAL LOW (ref 28–170)
Saturation Ratios: 8 % — ABNORMAL LOW (ref 10.4–31.8)
TIBC: 335 ug/dL (ref 250–450)
UIBC: 310 ug/dL

## 2022-06-14 LAB — HEPARIN LEVEL (UNFRACTIONATED)
Heparin Unfractionated: 0.25 IU/mL — ABNORMAL LOW (ref 0.30–0.70)
Heparin Unfractionated: 0.3 IU/mL (ref 0.30–0.70)
Heparin Unfractionated: 0.34 IU/mL (ref 0.30–0.70)

## 2022-06-14 LAB — LIPID PANEL
Cholesterol: 162 mg/dL (ref 0–200)
HDL: 36 mg/dL — ABNORMAL LOW (ref >40)
LDL Cholesterol: 97 mg/dL (ref 0–99)
Total CHOL/HDL Ratio: 4.5 RATIO
Triglycerides: 147 mg/dL (ref <150)
VLDL: 29 mg/dL (ref 0–40)

## 2022-06-14 MED ORDER — SODIUM CHLORIDE 0.9 % IV SOLN
1.0000 g | INTRAVENOUS | Status: DC
  Administered 2022-06-14 – 2022-06-18 (×5): 1 g via INTRAVENOUS
  Filled 2022-06-14 (×5): qty 10

## 2022-06-14 MED ORDER — POTASSIUM CHLORIDE CRYS ER 20 MEQ PO TBCR
40.0000 meq | EXTENDED_RELEASE_TABLET | Freq: Once | ORAL | Status: AC
  Administered 2022-06-14: 40 meq via ORAL
  Filled 2022-06-14: qty 2

## 2022-06-14 MED ORDER — LACTATED RINGERS IV SOLN
INTRAVENOUS | Status: AC
Start: 2022-06-14 — End: 2022-06-15

## 2022-06-14 MED ORDER — CALCIUM CARBONATE ANTACID 500 MG PO CHEW
400.0000 mg | CHEWABLE_TABLET | Freq: Once | ORAL | Status: AC
  Administered 2022-06-14: 400 mg via ORAL
  Filled 2022-06-14: qty 2

## 2022-06-14 NOTE — Progress Notes (Signed)
BLE venous duplex has been completed.  Critical findings given to Dr. [name].   Results can be found under chart review under CV PROC. 06/14/2022 10:15 AM Margarie Mcguirt RVT, RDMS

## 2022-06-14 NOTE — Progress Notes (Signed)
ANTICOAGULATION CONSULT NOTE - follow up  Pharmacy Consult for heparin Indication: VTE treatment  Allergies  Allergen Reactions   Penicillins Shortness Of Breath    Other reaction(s): Irregular Heart Rate, Other (See Comments) Rapid heartrate    Alprazolam Hives and Other (See Comments)    Hard to arouse, unresponsiveness   Ativan [Lorazepam] Other (See Comments)    Note: tolerates midazolam fine Face & Throat Swelling.   Corticosteroids Other (See Comments)    Other reaction(s): Other (see comments) Psychotic behaviour     Erythromycin     Other reaction(s): Other (See Comments) Severe stomach pain    Prednisone Other (See Comments)    Anxiety & Nervous Breakdown.   Savella  [Milnacipran]    Prednisolone Anxiety    Patient Measurements: Height: '5\' 6"'$  (167.6 cm) Weight: 109.3 kg (240 lb 15.4 oz) IBW/kg (Calculated) : 59.3 Heparin Dosing Weight: 84.7 kg  Vital Signs: Temp: 98.1 F (36.7 C) (08/22 0200) Temp Source: Oral (08/21 2000) BP: 133/70 (08/22 0200) Pulse Rate: 72 (08/22 0200)  Labs: Recent Labs    06/13/22 1240 06/13/22 1739 06/13/22 1841 06/14/22 0359  HGB 8.8*  --   --   --   HCT 29.0*  --   --   --   PLT 185  --   --   --   APTT  --   --  32  --   LABPROT  --   --  16.4*  --   INR  --   --  1.3*  --   HEPARINUNFRC  --   --   --  0.25*  CREATININE 2.03*  --   --   --   TROPONINIHS  --  4  --   --      Estimated Creatinine Clearance: 37.4 mL/min (A) (by C-G formula based on SCr of 2.03 mg/dL (H)).   Medical History: Past Medical History:  Diagnosis Date   Arthritis    Back pain    Cancer [facility])    pseudomyxoma peritonei   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[place]) 03/19/2020   Chronic fatigue syndrome    Diabetes mellitus without complication (HCC)    DVT of deep femoral vein, left ([place]) 03/19/2020   Fibromyalgia    Goals of care, counseling/discussion 03/19/2020   Hypertension    Iron deficiency anemia due  to chronic blood loss 05/14/2021   Malignant pseudomyxoma peritonei ([place]) 03/19/2020   Pernicious anemia 05/14/2021   Presence of IVC filter 03/19/2020   Pulmonary embolism, bilateral ([place]) 03/19/2020    Medications:  Scheduled:   buPROPion  150 mg Oral Daily   busPIRone  15 mg Oral TID   citalopram  40 mg Oral Daily   dicyclomine  20 mg Oral TID AC & HS   diphenoxylate-atropine  2 tablet Oral QID   famotidine  20 mg Oral BID   meclizine  25 mg Oral TID   pantoprazole  40 mg Oral q morning   sodium chloride flush  3 mL Intravenous Q12H    Assessment: 61yo female presenting to [facility] with loss of consciousness. PMH significant for pulmonary embolism previously on Xarelto and anemia. Xarelto discontinued back in July 2023 due to GIB; patient also has an IVC filter in place. Discussed with MD, CT scan suspicious for new bilateral pelvic and femoral VTE and he discussed the risk/benefits of anticoagulation- patient agrees to proceed. Patient denies current s/sx bleeding (hematochezia, hematuria). Pharmacy has been consulted for IV  heparin dosing.   HL 0.25 subtherapeutic on 1400 units/hr Per RN no bleeding noted   Goal of Therapy:  Heparin level 0.3-0.5 units/ml given recent GIB Monitor platelets by anticoagulation protocol: Yes   Plan:  Increase heparin infusion to 1600 units/hr Check heparin level in 6 hours F/u LE dopplers Monitor for s/sx bleeding and ability to transition to PO anticoagulation  [place] 06/14/2022, 4:51 AM

## 2022-06-14 NOTE — Progress Notes (Signed)
       CROSS COVER NOTE  NAME: Wendy Barber MRN: [id number] DOB : [birth year]    Date of Service   06/14/2022   HPI/Events of Note   Medication request received for TUMS for patient report of bloating and stomach cramps. Patient declines ordered Maalox.  81: Contacted by nursing to report nausea refractory to zofran. [name] takes Zofran and Phenergan as needed at home per chart review.  Interventions   Plan: Tums  Home Phenergan 12.'5mg'$  x1     This document was prepared using Dragon voice recognition software and may include unintentional dictation errors.  Neomia Glass DNP, MHA, FNP-BC Nurse Practitioner [facility] Pager [phone]

## 2022-06-14 NOTE — Progress Notes (Signed)
IR Consulted by Dr. [name] and Dr. [place] Pink today regarding possible lower extremity thrombectomy.   The patient presented with generalized weakness and multiple syncopal episodes.    She has prior history of DVT and PE.  Patient has had Recovery IVC filter since 2005.    On physical examination, patient has moderate bilateral lower extremity swelling.  There is no color change or evidence of compartment syndrome at this time.  Given patient's acute renal failure, we should hold off on IR thrombectomy at this time given degree of leg swelling and and the risk of contrast to her kidneys.  If patient's renal failure improves, consider CT Venogram to determine best to approach patient's filter/IVC thrombus.   If patient's leg swelling progresses before renal function improvement, please consult IR to reconsider thrombectomy/lysis with minimal contrast usage.  Renal duplex to evaluate renal veins should also be considered to exclude renal vein thrombosis as a potential cause of her AKI.  [place], MD [phone]

## 2022-06-14 NOTE — TOC Initial Note (Signed)
Transition of Care [facility]) - Initial/Assessment Note    Patient Details  Name: Wendy Barber MRN: [id number] Date of Birth: [birth year]  Transition of Care [facility]) CM/SW Contact:    Leeroy Cha, RN Phone Number: 06/14/2022, 9:01 AM  Clinical Narrative:                 Patient is active with [facility] for home p.t.  Expected Discharge Plan: Home/Self Care Barriers to Discharge: Continued Medical Work up   Patient Goals and CMS Choice Patient states their goals for this hospitalization and ongoing recovery are:: to go home CMS Medicare.gov Compare Post Acute Care list provided to:: Patient    Expected Discharge Plan and Services Expected Discharge Plan: Home/Self Care       Living arrangements for the past 2 months: [place]: [place] Agency: [place] (active with [facility])        Prior Living Arrangements/Services Living arrangements for the past 2 months: Single Family Home Lives with:: Spouse Patient language and need for interpreter reviewed:: Yes Do you feel safe going back to the place where you live?: Yes            Criminal Activity/Legal Involvement Pertinent to Current Situation/Hospitalization: No - Comment as needed  Activities of Daily Living Home Assistive Devices/Equipment: Eyeglasses, Environmental consultant (specify type), Bedside commode/3-in-1, Cane (specify quad or straight), Shower chair with back ADL Screening (condition at time of admission) Patient's cognitive ability adequate to safely complete daily activities?: No Is the patient deaf or have difficulty hearing?: No Does the patient have difficulty seeing, even when wearing glasses/contacts?: No Does the patient have difficulty concentrating, remembering, or making decisions?: Yes Patient able to express need for assistance with ADLs?: Yes Does the patient have difficulty dressing or bathing?: Yes Independently performs ADLs?:  No Communication: Independent Dressing (OT): Needs assistance Is this a change from baseline?: Pre-admission baseline Grooming: Needs assistance Is this a change from baseline?: Pre-admission baseline Feeding: Needs assistance Is this a change from baseline?: Pre-admission baseline Bathing: Needs assistance Is this a change from baseline?: Pre-admission baseline Toileting: Needs assistance Is this a change from baseline?: Pre-admission baseline In/Out Bed: Needs assistance Is this a change from baseline?: Pre-admission baseline Walks in Home: Needs assistance Is this a change from baseline?: Pre-admission baseline Does the patient have difficulty walking or climbing stairs?: Yes Weakness of Legs: Both Weakness of Arms/Hands: Both  Permission Sought/Granted                  Emotional Assessment Appearance:: Appears stated age Attitude/Demeanor/Rapport: Engaged Affect (typically observed): Calm Orientation: : Oriented to Self, Oriented to Place, Oriented to  Time, Oriented to Situation Alcohol / Substance Use: Not Applicable Psych Involvement: No (comment)  Admission diagnosis:  Syncope [R55] Patient Active Problem List   Diagnosis Date Noted   Symptomatic bradycardia 06/13/2022   History of excision of intestinal structure 06/13/2022   CAD (coronary artery disease) 06/13/2022   Aortic atherosclerosis ([place]) 06/13/2022   Bilateral iliac artery thrombosis ([place]) 06/13/2022   Syncope 05/31/2022   Hypotension 05/31/2022   Acute lower GI bleeding 05/04/2022   AKI (acute kidney injury) ([place]) 05/04/2022   History of deep vein thrombosis (DVT) of lower extremity 05/04/2022   History of pulmonary embolus (PE) 05/04/2022   Anxiety 05/04/2022   Pernicious anemia 05/14/2021  Iron deficiency anemia due to chronic blood loss 05/14/2021   Abdominal wall abscess    Hypokalemia    Surgical wound infection 01/16/2021   C. 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[place]) 03/19/2020   Goals of care, counseling/discussion 03/19/2020   Malignant pseudomyxoma peritonei ([place]) 03/19/2020   DVT of deep femoral vein, left ([place]) 03/19/2020   Pulmonary embolism, bilateral ([place]) 03/19/2020   Presence of IVC filter 03/19/2020   Hepatic encephalopathy ([place]) 11/22/2019   Confusion 11/21/2019   Autoimmune hepatitis ([place]) 11/21/2019   HTN (hypertension) 11/21/2019   DM2 (diabetes mellitus, type 2) ([place]) 11/21/2019   AMS (altered mental status) 11/21/2019   Closed right ankle fracture 08/27/2019   Acute deep vein thrombosis (DVT) of femoral vein of left lower extremity ([place]) 08/03/2018   History of colon cancer 08/03/2018   History of partial colectomy 08/03/2018   Spinal stenosis 08/03/2018   Morbid obesity ([place]) 08/03/2018   Cerebral meningioma ([place]) 01/05/2018   Arthritis of right shoulder region 02/18/2017   Depression 02/14/2016   History of renal calculi 02/14/2016   Midline low back pain 02/14/2016   Obesity, morbid, BMI 40.0-49.9 ([place]) 01/07/2016   Migraine with aura and without status migrainosus, not intractable 01/07/2016   Chronic fatigue syndrome 09/23/2013   GERD (gastroesophageal reflux disease) 09/23/2013   History of hepatitis A 09/23/2013   Rheumatoid arthritis involving multiple sites ([place]) 09/23/2013   PCP:  [name], MD Pharmacy:   [facility] [place], [place] [address] [place] [address] [place] [address] Phone: [phone] Fax: [phone]  [facility] [place], [place] [address] [place] [address] Phone: [phone] Fax: [phone]     Social Determinants of Health (SDOH) Interventions    Readmission Risk Interventions    06/01/2022    3:59 PM 05/05/2022    8:39 AM  Readmission Risk  Prevention Plan  Transportation Screening Complete Complete  PCP or Specialist Appt within 5-7 Days  Complete  Home Care Screening  Complete  Medication Review (RN CM)  Complete  Medication Review (RN Care Manager) Complete   HRI or [place] Complete   SW Recovery Care/Counseling Consult Complete   Palliative Care Screening Not [place] Not Applicable

## 2022-06-14 NOTE — Progress Notes (Signed)
[facility] PROGRESS NOTE   Wendy Barber BJS:[id number] DOB: 13-Aug-1961 DOA: 06/13/2022  PCP: [name], MD  Brief History/Interval Summary: 61 y.o. 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  She was noted to be orthostatic.  There was concern raised for venous thrombosis of her iliac veins and lower extremity veins.  She was started on IV heparin.  She was found to have acute kidney injury.  She was hospitalized for further management.     Consultants: Medical oncology.  Procedures: Lower extremity Doppler study    Subjective/Interval History: Patient complains of feeling weak and dizzy at times.  Denies any chest pain.  Some nausea but no vomiting.    Assessment/Plan:  Syncope/orthostatic hypotension Patient with multiple episodes of near syncope and syncopal episode while she was in the emergency department.  Differential is broad.  She did have orthostatic drop in blood pressure. Doppler studies have shown bilateral lower extremity DVT.  There is also concern for IVC thrombosis.  This could be impairing venous return resulting in her symptoms.  See below.  Bilateral acute DVT with concern for thrombus of IVC Patient does have IVC filter placed in 2005.  She mentions that she used to be on anticoagulation up until recently when it was stopped due to rectal bleeding.  It appears that she was on rivaroxaban previously. Currently on IV heparin. Medical oncology is following.  Doppler studies as mentioned earlier.  May need to involve interventional radiology.  Due to renal insufficiency we  cannot use contrast to further evaluate these clots.  Acute kidney injury Baseline creatinine appears to be normal based on blood work from earlier this month.  Came in with creatinine of 2.0.  Noted to be 1.8 today.  Continue with IV fluids.  Monitor urine output.  Abnormal UA Suggest UTI.  She is nauseated.  We will place her on ceftriaxone for now and obtain urine cultures.  Essential hypertension Noted to have orthostatic drop in blood pressures.  Holding antihypertensives.  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  Has received treatments previously.  Not on any active chemotherapy currently.  Diabetes mellitus type 2 Monitor CBGs.    Iron deficiency anemia Monitor hemoglobin closely while she is on heparin.  Hemoglobin noted to be lower today compared to yesterday.  There has been no overt bleeding.  History of anxiety Continue her psychotropic agents.  History of coronary artery disease Stable.  LDL noted to be 97.  Not noted to be on statin.  Obesity Estimated body mass index is 39.32 kg/m as calculated from the following:   Height as of this encounter: '5\' 6"'$  (1.676 m).   Weight as of this encounter: 110.5 kg.   DVT Prophylaxis: On IV heparin Code Status: Full code Family Communication: Discussed with patient Disposition Plan: To be determined  Status is: Observation The patient will require care spanning > 2 midnights and should be moved to inpatient because: Bilateral DVT with concern for IVC thrombus requiring IV heparin      Medications: Scheduled:  buPROPion  150 mg Oral Daily   busPIRone  15 mg Oral TID   citalopram  40 mg  Oral Daily   dicyclomine  20 mg Oral TID AC & HS   diphenoxylate-atropine  2 tablet Oral QID   famotidine  20 mg Oral BID   meclizine  25 mg Oral TID   pantoprazole  40 mg Oral q morning   sodium chloride flush  3 mL Intravenous Q12H   Continuous:  heparin 1,600  Units/hr (06/14/22 0800)   lactated ringers 125 mL/hr at 06/14/22 1041   ZOX:[id number] **OR** acetaminophen, alum & mag hydroxide-simeth, diphenhydrAMINE, HYDROmorphone (DILAUDID) injection, loperamide, ondansetron **OR** ondansetron (ZOFRAN) IV, traMADol  Antibiotics: Anti-infectives (From admission, onward)    None       Objective:  Vital Signs  Vitals:   06/14/22 0200 06/14/22 0500 06/14/22 0523 06/14/22 1029  BP: 133/70  110/60 105/63  Pulse: 72  73 76  Resp: 16  16   Temp: 98.1 F (36.7 C)  98.6 F (37 C)   TempSrc: Oral  Oral   SpO2: 100%  96% 99%  Weight:  110.5 kg    Height:        Intake/Output Summary (Last 24 hours) at 06/14/2022 1034 Last data filed at 06/14/2022 0800 Gross per 24 hour  Intake 1873.73 ml  Output 600 ml  Net 1273.73 ml   Filed Weights   06/13/22 1800 06/14/22 0500  Weight: 109.3 kg 110.5 kg    General appearance: Awake alert.  In no distress Resp: Clear to auscultation bilaterally.  Normal effort Cardio: S1-S2 is normal regular.  No S3-S4.  No rubs murmurs or bruit GI: Abdomen is soft.  Nontender nondistended.  Bowel sounds are present normal.  No masses organomegaly Extremities: Bilateral lower extremities noted Neurologic: Alert and oriented x3.  No focal neurological deficits.    Lab Results:  Data Reviewed: I have personally reviewed following labs and reports of the imaging studies  CBC: Recent Labs  Lab 06/13/22 1240 06/14/22 0900  WBC 15.0* 11.6*  HGB 8.8* 7.3*  HCT 29.0* 24.0*  MCV 94.2 94.1  PLT 185 147    Basic Metabolic Panel: Recent Labs  Lab 06/13/22 1240 06/14/22 0359  NA 135 135  K 3.9 3.4*  CL 103 105  CO2 19* 22  GLUCOSE 148* 160*  BUN 42* 40*  CREATININE 2.03* 1.88*  CALCIUM 9.1 8.7*    GFR: Estimated Creatinine Clearance: 40.6 mL/min (A) (by C-G formula based on SCr of 1.88 mg/dL (H)).  Liver Function Tests: Recent Labs  Lab 06/13/22 1240 06/14/22 0359  AST 27 19  ALT 16 14   ALKPHOS 154* 119  BILITOT <0.1* 0.5  PROT 8.3* 6.7  ALBUMIN 3.6 3.0*     Coagulation Profile: Recent Labs  Lab 06/13/22 1841  INR 1.3*     CBG: Recent Labs  Lab 06/13/22 2132 06/14/22 0747  GLUCAP 167* 132*    Lipid Profile: Recent Labs    06/14/22 0359  CHOL 162  HDL 36*  LDLCALC 97  TRIG 147  CHOLHDL 4.5     Anemia Panel: Recent Labs    06/14/22 0359  TIBC 335  IRON 25*    Radiology Studies: VAS [place] LOWER EXTREMITY VENOUS (DVT)  Result Date: 06/14/2022  Lower Venous DVT Study Patient Name:  Wendy Barber  Date of Exam:   06/14/2022 Medical Rec #: [id number]       Accession #:    [id number] Date of Birth: 01/25/1961       Patient Gender: F Patient Age:   73 years Exam Location:  [facility]  Procedure:      VAS [place] LOWER EXTREMITY VENOUS (DVT) Referring Phys: Collier Salina ENNEVER --------------------------------------------------------------------------------  Indications: Pain, and Swelling. Other Indications: Abnormality seen on CT of pelvis. Risk Factors: Hx of DVT & PE (IVC filter in place) Recurrent appendiceal cancer. Anticoagulation: Patient on Xarelto until 2 weeks ago - taken off due to rectal bleeding. Comparison Study: Previous exam (RLEV) on 05/17/21 was negative for DVT &                   positive for SVT in proximal GSV. Performing Technologist: Rogelia Rohrer RVT, RDMS  Examination Guidelines: A complete evaluation includes B-mode imaging, spectral Doppler, color Doppler, and power Doppler as needed of all accessible portions of each vessel. Bilateral testing is considered an integral part of a complete examination. Limited examinations for reoccurring indications may be performed as noted. The reflux portion of the exam is performed with the patient in reverse Trendelenburg.  +---------+---------------+---------+-----------+----------+-------------------+ RIGHT    CompressibilityPhasicitySpontaneityPropertiesThrombus Aging       +---------+---------------+---------+-----------+----------+-------------------+ CFV      None           No       No                   Acute               +---------+---------------+---------+-----------+----------+-------------------+ SFJ      None                                         Acute               +---------+---------------+---------+-----------+----------+-------------------+ FV Prox  None           No       No                   Acute               +---------+---------------+---------+-----------+----------+-------------------+ FV Mid   None           No       No                   Acute               +---------+---------------+---------+-----------+----------+-------------------+ FV DistalNone           No       No                   Acute               +---------+---------------+---------+-----------+----------+-------------------+ PFV      None           No       No                   Acute               +---------+---------------+---------+-----------+----------+-------------------+ POP      None           No       No                   Acute               +---------+---------------+---------+-----------+----------+-------------------+ PTV  Not well visualized +---------+---------------+---------+-----------+----------+-------------------+ PERO                                                  Not visualized      +---------+---------------+---------+-----------+----------+-------------------+ Gastroc  None           No       No                   Acute               +---------+---------------+---------+-----------+----------+-------------------+ GSV      None           No       No                   Acute               +---------+---------------+---------+-----------+----------+-------------------+ EIV      None           No       No                   Acute                +---------+---------------+---------+-----------+----------+-------------------+   Right Technical Findings: Not visualized segments include peroneal veins.  +---------+---------------+---------+-----------+----------+--------------+ LEFT     CompressibilityPhasicitySpontaneityPropertiesThrombus Aging +---------+---------------+---------+-----------+----------+--------------+ CFV      None           No       No                   Acute          +---------+---------------+---------+-----------+----------+--------------+ SFJ      None                                                        +---------+---------------+---------+-----------+----------+--------------+ FV Prox  None           No       No                   Acute          +---------+---------------+---------+-----------+----------+--------------+ FV Mid   None           No       No                   Acute          +---------+---------------+---------+-----------+----------+--------------+ FV DistalNone           No       No                   Acute          +---------+---------------+---------+-----------+----------+--------------+ PFV      None           No       No                   Acute          +---------+---------------+---------+-----------+----------+--------------+ POP      None           No  No                   Acute          +---------+---------------+---------+-----------+----------+--------------+ PTV      Full                                                        +---------+---------------+---------+-----------+----------+--------------+ PERO     Full                                                        +---------+---------------+---------+-----------+----------+--------------+ Gastroc  None           No       No                   Acute          +---------+---------------+---------+-----------+----------+--------------+ SSV      None           No       No                    Acute          +---------+---------------+---------+-----------+----------+--------------+ EIV      None           No       No                   Acute          +---------+---------------+---------+-----------+----------+--------------+    Summary: BILATERAL: -No evidence of popliteal cyst, bilaterally. -Imaging of bilateral common iliac veins attempted, however due to midline scarring from previous surgeries, poor ultrasound/tissue interface, and bowel gas unable to visualize. A portion of the distal IVC shows evidence on acute DVT. RIGHT: - Findings consistent with acute deep vein thrombosis involving the right common femoral vein, SF junction, right femoral vein, right proximal profunda vein, right popliteal vein, right gastrocnemius veins, and external iliac vein. - Findings consistent with acute superficial vein thrombosis involving the right great saphenous vein.  LEFT: - Findings consistent with acute deep vein thrombosis involving the left common femoral vein, SF junction, left femoral vein, left proximal profunda vein, left popliteal vein, and left gastrocnemius veins. - Findings consistent with acute superficial vein thrombosis involving the left small saphenous vein.  *See table(s) above for measurements and observations.    Preliminary    CT ABDOMEN PELVIS WO CONTRAST  Result Date: 06/13/2022 CLINICAL DATA:  Weakness and near-syncope. Bilateral hip pain. History of appendiceal cancer with metastasis. Pseudomyxoma peritonei. History of pulmonary emboli and deep venous thrombosis. * Tracking Code: BO * EXAM: CT ABDOMEN AND PELVIS WITHOUT CONTRAST TECHNIQUE: Multidetector CT imaging of the abdomen and pelvis was performed following the standard protocol without IV contrast. RADIATION DOSE REDUCTION: This exam was performed according to the departmental dose-optimization program which includes automated exposure control, adjustment of the mA and/or kV according to patient size  and/or use of iterative reconstruction technique. COMPARISON:  03/23/2022 PET. 09/29/2021 contrast-enhanced chest abdomen and pelvic CT. FINDINGS: Lower chest: Mild bibasilar scarring. Normal heart size without pericardial or pleural effusion. Lad coronary artery calcification. Hepatobiliary: Advanced cirrhosis,  as evidenced by an irregular hepatic capsule. Limited sensitivity for focal liver lesion due to lack of IV contrast. Cholecystectomy, without biliary ductal dilatation. Pancreas: Moderate pancreatic atrophy with fatty replacement in the head and uncinate process. Spleen: Splenectomy. Adrenals/Urinary Tract: Normal adrenal glands. No renal calculi or hydronephrosis. Decompressed urinary bladder, without bladder calculi. The ureters are somewhat challenging to follow, but no ureteric stones are seen. Stomach/Bowel: Normal stomach, without wall thickening. Surgical sutures at the rectosigmoid junction. Right hemicolectomy. Concurrent right pelvic enterotomy. Bowel is poorly evaluated secondary to lack of oral or IV contrast. Anterior position of bowel loops is relatively similar and suggests adhesions. Vascular/Lymphatic: Aortic atherosclerosis. IVC filter is appropriately positioned, just below the renal veins. Edema is identified about both pelvic and groin venous systems, including on 72 and 73 of series 2. Subtle hypoattenuation within the left external iliac vein including on 66/2. No abdominopelvic adenopathy. Reproductive: Hysterectomy.  No adnexal mass. Other: No significant free fluid. No free intraperitoneal air. No convincing evidence of peritoneal metastasis. Musculoskeletal: L4-5 trans pedicle screw fixation. Moderate convex left lumbar spine curvature. IMPRESSION: 1. Perivascular edema within both groins and superficial pelvic sidewalls. Suspect bilateral pelvic and femoral venous thrombosis, suboptimally evaluated on this noncontrast exam. IVC filter in place. Ultrasound or dedicated CT  venography versus MR venography could confirm. 2. Otherwise, low sensitivity exam secondary to lack of oral or IV contrast. 3. Extensive surgical changes throughout the abdomen, without specific evidence of metastatic disease. 4. Cirrhosis 5. Age advanced coronary artery atherosclerosis. Recommend assessment of coronary risk factors. 6.  Aortic Atherosclerosis (ICD10-I70.0). Electronically Signed   By: Abigail Miyamoto M.D.   On: 06/13/2022 16:38   CT L-SPINE NO CHARGE  Result Date: 06/13/2022 CLINICAL DATA:  Generalized weakness. Bilateral hip pain. History of metastatic disease EXAM: CT Lumbar Spine without contrast TECHNIQUE: Technique: Multiplanar CT images of the lumbar spine were reconstructed from contemporary CT of the Abdomen and Pelvis. RADIATION DOSE REDUCTION: This exam was performed according to the departmental dose-optimization program which includes automated exposure control, adjustment of the mA and/or kV according to patient size and/or use of iterative reconstruction technique. CONTRAST:  None COMPARISON:  Abdominal CT 09/29/2021 FINDINGS: Segmentation: 5 lumbar type vertebrae based on the available coverage. L5 is incompletely segmented from the sacrum at the left transverse process Alignment: Levoscoliosis. Vertebrae: No evidence of acute fracture or aggressive bone lesion. Paraspinal and other soft tissues: The IVC and left more than right iliac veins appear high-density and expanded with retroperitoneal edema. Partially covered liver shows lobulation from cirrhosis. Disc levels: T12- L1: Unremarkable. L1-L2: Mild disc narrowing L2-L3: Degenerative facet spurring and ligamentum flavum thickening. Disc space narrowing and bulging. Spinal stenosis is at least moderate. Moderate bilateral foraminal narrowing L3-L4: Disc collapse with endplate and facet spurring. At least moderate spinal stenosis. Biforaminal impingement L4-L5: PLIF with solid arthrodesis. Decompression with patent appearance of  the canal. Mild to moderate left foraminal narrowing L5-S1:Mild spurring with incomplete segmentation. No bony impingement. IMPRESSION: 1. Dominant findings in the retroperitoneum where there is evidence of confluent thrombus in the iliac veins and IVC below a filter. 2. No acute finding in the lumbar spine. 3. Lumbar spine degeneration and scoliosis with moderate spinal stenosis at L2-3 and L3-4. Electronically Signed   By: Jorje Guild M.D.   On: 06/13/2022 16:25       LOS: 0 days   [facility] Pager on [email or web address]  06/14/2022, 10:34 AM

## 2022-06-14 NOTE — Inpatient Diabetes Management (Signed)
Inpatient Diabetes Program Recommendations  AACE/ADA: New Consensus Statement on Inpatient Glycemic Control (2015)  Target Ranges:  Prepandial:   less than 140 mg/dL      Peak postprandial:   less than 180 mg/dL (1-2 hours)      Critically ill patients:  140 - 180 mg/dL   Lab Results  Component Value Date   GLUCAP 156 (H) 06/14/2022   HGBA1C 6.5 (H) 06/01/2022    Review of Glycemic Control  Diabetes history: DM2 Outpatient Diabetes medications: None Current orders for Inpatient glycemic control: None  HgbA1C - 6.5%  Inpatient Diabetes Program Recommendations:    Consider adding Novolog 0-6 TID with meals if post-prandials > 180 mg/dL  Will continue to follow.  Thank you. Lorenda Peck, RD, LDN, [place] Inpatient Diabetes Coordinator [phone]

## 2022-06-14 NOTE — Plan of Care (Signed)
  Problem: Education: Goal: Knowledge of General Education information will improve Description: Including pain rating scale, medication(s)/side effects and non-pharmacologic comfort measures Outcome: Progressing   Problem: Health Behavior/Discharge Planning: Goal: Ability to manage health-related needs will improve Outcome: Progressing   Problem: Clinical Measurements: Goal: Cardiovascular complication will be avoided Outcome: Progressing   Problem: Activity: Goal: Risk for activity intolerance will decrease Outcome: Progressing   Problem: Clinical Measurements: Goal: Diagnostic test results will improve Outcome: Not Progressing  RN notified MD of labs & UTI resulted this am

## 2022-06-14 NOTE — Progress Notes (Signed)
Notified Dr. [place] Pink K+ 3.4 & positive UTI

## 2022-06-14 NOTE — Progress Notes (Signed)
[place] for Heparin Indication:  VTE  Allergies  Allergen Reactions   Penicillins Shortness Of Breath    Other reaction(s): Irregular Heart Rate, Other (See Comments) Rapid heartrate    Alprazolam Hives and Other (See Comments)    Hard to arouse, unresponsiveness   Ativan [Lorazepam] Other (See Comments)    Note: tolerates midazolam fine Face & Throat Swelling.   Corticosteroids Other (See Comments)    Other reaction(s): Other (see comments) Psychotic behaviour     Erythromycin     Other reaction(s): Other (See Comments) Severe stomach pain    Prednisone Other (See Comments)    Anxiety & Nervous Breakdown.   Savella  [Milnacipran]    Prednisolone Anxiety    Patient Measurements: Height: '5\' 6"'$  (167.6 cm) Weight: 110.5 kg (243 lb 9.7 oz) IBW/kg (Calculated) : 59.3 Heparin Dosing Weight: 84.7 kg  Vital Signs: Temp: 98.6 F (37 C) (08/22 1624) Temp Source: Oral (08/22 1624) BP: 129/72 (08/22 1624) Pulse Rate: 77 (08/22 1624)  Labs: Recent Labs    06/13/22 1240 06/13/22 1739 06/13/22 1841 06/14/22 0359 06/14/22 0900 06/14/22 1104 06/14/22 1636  HGB 8.8*  --   --   --  7.3*  --   --   HCT 29.0*  --   --   --  24.0*  --   --   PLT 185  --   --   --  211  --   --   APTT  --   --  32  --   --   --   --   LABPROT  --   --  16.4*  --   --   --   --   INR  --   --  1.3*  --   --   --   --   HEPARINUNFRC  --   --   --  0.25*  --  0.30 0.34  CREATININE 2.03*  --   --  1.88*  --   --   --   TROPONINIHS  --  4  --   --   --   --   --     Estimated Creatinine Clearance: 40.6 mL/min (A) (by C-G formula based on SCr of 1.88 mg/dL (H)).  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 - Has IVC filter in place from ~98yr ago (still in place per scan)  Today, 06/14/2022: - Hgb lower, Plt stable WNL - confirmatory heparin level remains within goal - no bleeding or infusion issues per RN, suspect dilutional anemia  Goal of Therapy:  Heparin level 0.3-0.5 units/ml Monitor platelets by anticoagulation protocol: Yes   Plan:  - Continue IV heparin 1600 units/hr - Low goal (0.3-0.5) given recent GIB (July) - Daily HL and CBC   Wendy Barber A 06/14/2022,6:18 PM

## 2022-06-14 NOTE — Consult Note (Addendum)
The patient is well-known to me.  Wendy Barber is a very nice 61 year old white female.  She has a history of a adenocarcinoma of the appendix.  She actually underwent 2 separate surgeries at [facility].  She had intraperitoneal chemotherapy.  I think the last surgery was probably about a year or so ago.  She has had multiple complications from this.  She has had history of GI bleeding.  She has had a history of superior mesenteric vein thrombus.  She had been on Xarelto but because of GI bleeding we had stopped this.  I think this was back in July.  She was recently in at [facility].  She was discharged on 06/03/2022.  She had what appeared to be a syncopal episode.  She has been anemic.  She is anemic for a while.  She had a 2D echocardiogram and carotid ultrasound which were all unremarkable.  She had an EEG which was unremarkable.  She apparently got 1 unit of packed red blood cells.  Her stool was negative.  She had iron deficiency.  She received IV iron.  She is started on oral iron as an outpatient.  She is now admitted because of another episode of syncope.  She has some lower extremity pain.  She has not had any obvious seizure activity.  She has chronic abdominal pain and diarrhea.  She has renal insufficiency right now.  Her BUN is 42 creatinine 2.  She had normal renal function about 10 days ago.  Her white cell count is 15.  Hemoglobin 8.8.  Platelet count 185,000.  She had a CT of the abdomen pelvis.  This was on 06/12/2022.  This was without contrast.  There was suspicion for bilateral pelvic and femoral vein thrombosis.  There is no evidence of malignancy.  She had cirrhotic changes.  Of note, she has had an MRI of the brain on 06/07/2022.  This was relatively unremarkable.  She says there is some concern about her pituitary gland.  On the MRI report, there is some asymmetry.  She has a meningioma which is chronic.  Otherwise everything else looked okay.  She currently is on  heparin.  She has had vomiting.  She has had no cough.  There is been no obvious melena.  She has had some leg swelling.    Her vital signs are temperature 98.6.  Pulse 73.  Blood pressure 110/60.  Weight is 243 pounds.  Her head exam shows no ocular or oral lesions.  She has no scleral icterus.  There is no thrush.  There is no adenopathy in the neck.  Lungs are clear.  Cardiac exam regular rate and rhythm without any murmurs.  Abdomen is obese but soft.  She has multiple laparotomy scars.  Bowel sounds are present.  There is no obvious guarding or rebound tenderness.  Extremities shows some mild swelling which is chronic.  I cannot palpate any venous cord.  Neurological exam is nonfocal.  She is alert and oriented.  I am not sure as to why she had this episode of syncope.  Again, I do not know if is anything with the pituitary..  She cannot have any contrast with the scans because of her renal function.  Again I do not know why her renal function is low unless there is something that happened when she was at [facility] with possible contrast for any scans.  I suspect that there might be a problem with the filter.  She has an IVC filter in.  This was placed several years ago.  I think a ultrasound of her legs would not be a bad idea.  We can see if there is thrombi.  I wonder if the filter might be blocked up.  I agree with the heparin for right now.  The anemia could be multifactorial.  Again I do not see an issue with malignancy.  Hopefully, this hospitalization will not be prolonged.  We will follow along and try to help [place] any way that we can.  [name], MD  Oswaldo Milian 41:13

## 2022-06-14 NOTE — Progress Notes (Signed)
ANTICOAGULATION CONSULT NOTE - Follow Up Consult  Pharmacy Consult for Heparin Indication:  VTE  Allergies  Allergen Reactions   Penicillins Shortness Of Breath    Other reaction(s): Irregular Heart Rate, Other (See Comments) Rapid heartrate    Alprazolam Hives and Other (See Comments)    Hard to arouse, unresponsiveness   Ativan [Lorazepam] Other (See Comments)    Note: tolerates midazolam fine Face & Throat Swelling.   Corticosteroids Other (See Comments)    Other reaction(s): Other (see comments) Psychotic behaviour     Erythromycin     Other reaction(s): Other (See Comments) Severe stomach pain    Prednisone Other (See Comments)    Anxiety & Nervous Breakdown.   Savella  [Milnacipran]    Prednisolone Anxiety    Patient Measurements: Height: '5\' 6"'$  (167.6 cm) Weight: 110.5 kg (243 lb 9.7 oz) IBW/kg (Calculated) : 59.3 Heparin Dosing Weight: 84.7 kg  Vital Signs: Temp: 98.1 F (36.7 C) (08/22 1204) Temp Source: Oral (08/22 0523) BP: 122/70 (08/22 1204) Pulse Rate: 72 (08/22 1204)  Labs: Recent Labs    06/13/22 1240 06/13/22 1739 06/13/22 1841 06/14/22 0359 06/14/22 0900 06/14/22 1104  HGB 8.8*  --   --   --  7.3*  --   HCT 29.0*  --   --   --  24.0*  --   PLT 185  --   --   --  211  --   APTT  --   --  32  --   --   --   LABPROT  --   --  16.4*  --   --   --   INR  --   --  1.3*  --   --   --   HEPARINUNFRC  --   --   --  0.25*  --  0.30  CREATININE 2.03*  --   --  1.88*  --   --   TROPONINIHS  --  4  --   --   --   --     Estimated Creatinine Clearance: 40.6 mL/min (A) (by C-G formula based on SCr of 1.88 mg/dL (H)).   Assessment: AC/Heme: H/o DVT, BL PE and superior mesenteric vein thrombus prev on Xarelto but d/c'd in July for GIB (pt accidentally took extra dose)  - 8/20 CT A/P:  suspicion for new bilateral pelvic and femoral vein thrombosis - 8/22: A portion of the distal IVC shows evidence of acute DVT and BL DVT's of the LE. - Has IVC  filter in place from ~71yr ago (still in place per scan) - Hgb 8.8, Plts 185 with known IDA. Hep level 0.25>0.3 now in goal range.  Goal of Therapy:  Heparin level 0.3-0.5 units/ml Monitor platelets by anticoagulation protocol: Yes   Plan:  - Con't IV heparin 1600 units/hr - Low goal (0.3-0.5) given recent GIB (July) - Confirm Hep level in 6 hrs. - Daily HL and CBC   Kartier Bennison S. RAlford Highland PharmD, BCPS Clinical Staff Pharmacist [email or web address]  RAlford Highland CThe Timken Company8/22/2023,12:27 PM

## 2022-06-15 ENCOUNTER — Inpatient Hospital Stay (HOSPITAL_COMMUNITY): Payer: Medicare Other

## 2022-06-15 DIAGNOSIS — N179 Acute kidney failure, unspecified: Secondary | ICD-10-CM | POA: Diagnosis not present

## 2022-06-15 DIAGNOSIS — I700 Atherosclerosis of aorta: Secondary | ICD-10-CM | POA: Diagnosis not present

## 2022-06-15 DIAGNOSIS — I82220 Acute embolism and thrombosis of inferior vena cava: Secondary | ICD-10-CM

## 2022-06-15 DIAGNOSIS — R12 Heartburn: Secondary | ICD-10-CM | POA: Insufficient documentation

## 2022-06-15 DIAGNOSIS — C772 Secondary and unspecified malignant neoplasm of intra-abdominal lymph nodes: Secondary | ICD-10-CM

## 2022-06-15 DIAGNOSIS — C181 Malignant neoplasm of appendix: Secondary | ICD-10-CM | POA: Diagnosis not present

## 2022-06-15 DIAGNOSIS — F419 Anxiety disorder, unspecified: Secondary | ICD-10-CM | POA: Diagnosis not present

## 2022-06-15 DIAGNOSIS — R55 Syncope and collapse: Secondary | ICD-10-CM | POA: Diagnosis not present

## 2022-06-15 LAB — COMPREHENSIVE METABOLIC PANEL
ALT: 15 U/L (ref 0–44)
AST: 22 U/L (ref 15–41)
Albumin: 3.2 g/dL — ABNORMAL LOW (ref 3.5–5.0)
Alkaline Phosphatase: 130 U/L — ABNORMAL HIGH (ref 38–126)
Anion gap: 9 (ref 5–15)
BUN: 33 mg/dL — ABNORMAL HIGH (ref 6–20)
CO2: 23 mmol/L (ref 22–32)
Calcium: 9 mg/dL (ref 8.9–10.3)
Chloride: 108 mmol/L (ref 98–111)
Creatinine, Ser: 1.34 mg/dL — ABNORMAL HIGH (ref 0.44–1.00)
GFR, Estimated: 46 mL/min — ABNORMAL LOW (ref >60)
Glucose, Bld: 175 mg/dL — ABNORMAL HIGH (ref 70–99)
Potassium: 3.5 mmol/L (ref 3.5–5.1)
Sodium: 140 mmol/L (ref 135–145)
Total Bilirubin: 0.4 mg/dL (ref 0.3–1.2)
Total Protein: 7.4 g/dL (ref 6.5–8.1)

## 2022-06-15 LAB — CBC
HCT: 24.7 % — ABNORMAL LOW (ref 36.0–46.0)
Hemoglobin: 7.5 g/dL — ABNORMAL LOW (ref 12.0–15.0)
MCH: 28.6 pg (ref 26.0–34.0)
MCHC: 30.4 g/dL (ref 30.0–36.0)
MCV: 94.3 fL (ref 80.0–100.0)
Platelets: 297 10*3/uL (ref 150–400)
RBC: 2.62 MIL/uL — ABNORMAL LOW (ref 3.87–5.11)
RDW: 18.6 % — ABNORMAL HIGH (ref 11.5–15.5)
WBC: 11.2 10*3/uL — ABNORMAL HIGH (ref 4.0–10.5)
nRBC: 0.3 % — ABNORMAL HIGH (ref 0.0–0.2)

## 2022-06-15 LAB — TROPONIN I (HIGH SENSITIVITY): Troponin I (High Sensitivity): 3 ng/L (ref <18)

## 2022-06-15 LAB — PREPARE RBC (CROSSMATCH)

## 2022-06-15 LAB — HEPARIN LEVEL (UNFRACTIONATED): Heparin Unfractionated: 0.47 IU/mL (ref 0.30–0.70)

## 2022-06-15 LAB — GLUCOSE, CAPILLARY
Glucose-Capillary: 158 mg/dL — ABNORMAL HIGH (ref 70–99)
Glucose-Capillary: 136 mg/dL — ABNORMAL HIGH (ref 70–99)
Glucose-Capillary: 135 mg/dL — ABNORMAL HIGH (ref 70–99)
Glucose-Capillary: 142 mg/dL — ABNORMAL HIGH (ref 70–99)
Glucose-Capillary: 155 mg/dL — ABNORMAL HIGH (ref 70–99)

## 2022-06-15 MED ORDER — IOHEXOL 350 MG/ML SOLN
100.0000 mL | Freq: Once | INTRAVENOUS | Status: AC | PRN
  Administered 2022-06-15: 100 mL via INTRAVENOUS

## 2022-06-15 MED ORDER — SODIUM CHLORIDE 0.9 % IV SOLN
12.5000 mg | Freq: Once | INTRAVENOUS | Status: AC
  Administered 2022-06-15: 12.5 mg via INTRAVENOUS
  Filled 2022-06-15: qty 12.5

## 2022-06-15 MED ORDER — SODIUM CHLORIDE 0.9% IV SOLUTION: Freq: Once | INTRAVENOUS | Status: AC

## 2022-06-15 MED ORDER — ADULT MULTIVITAMIN W/MINERALS CH
1.0000 | ORAL_TABLET | Freq: Every day | ORAL | Status: DC
  Filled 2022-06-15 (×6): qty 1

## 2022-06-15 MED ORDER — FUROSEMIDE 10 MG/ML IJ SOLN
20.0000 mg | Freq: Once | INTRAMUSCULAR | Status: AC
  Administered 2022-06-15: 20 mg via INTRAVENOUS
  Filled 2022-06-15: qty 2

## 2022-06-15 MED ORDER — PANTOPRAZOLE SODIUM 40 MG IV SOLR
40.0000 mg | Freq: Two times a day (BID) | INTRAVENOUS | Status: DC
Start: 2022-06-15 — End: 2022-06-17
  Administered 2022-06-15 – 2022-06-17 (×4): 40 mg via INTRAVENOUS
  Filled 2022-06-15 (×4): qty 10

## 2022-06-15 MED ORDER — SODIUM CHLORIDE (PF) 0.9 % IJ SOLN
INTRAMUSCULAR | Status: AC
  Filled 2022-06-15: qty 50

## 2022-06-15 MED ORDER — OXYCODONE HCL 5 MG PO TABS
5.0000 mg | ORAL_TABLET | ORAL | Status: DC | PRN
  Administered 2022-06-15 – 2022-06-17 (×6): 5 mg via ORAL
  Filled 2022-06-15 (×6): qty 1

## 2022-06-15 MED ORDER — SUCRALFATE 1 GM/10ML PO SUSP
1.0000 g | Freq: Four times a day (QID) | ORAL | Status: DC
  Administered 2022-06-15 – 2022-06-26 (×34): 1 g via ORAL
  Filled 2022-06-15 (×38): qty 10

## 2022-06-15 MED ORDER — LACTATED RINGERS IV SOLN: INTRAVENOUS | Status: AC

## 2022-06-15 MED ORDER — SUCRALFATE 1 GM/10ML PO SUSP: 1.0000 g | Freq: Four times a day (QID) | ORAL | Status: DC

## 2022-06-15 MED ORDER — SODIUM CHLORIDE 0.9 % IV SOLN
6.2500 mg | Freq: Four times a day (QID) | INTRAVENOUS | Status: DC | PRN
  Administered 2022-06-15 – 2022-06-22 (×8): 6.25 mg via INTRAVENOUS
  Filled 2022-06-15 (×19): qty 0.25

## 2022-06-15 MED ORDER — PANTOPRAZOLE SODIUM 40 MG IV SOLR: 40.0000 mg | Freq: Two times a day (BID) | INTRAVENOUS | Status: DC

## 2022-06-15 MED ORDER — HYDROMORPHONE HCL 1 MG/ML IJ SOLN
1.0000 mg | INTRAMUSCULAR | Status: DC | PRN
  Administered 2022-06-15 – 2022-06-25 (×27): 1 mg via INTRAVENOUS
  Filled 2022-06-15 (×27): qty 1

## 2022-06-15 NOTE — Plan of Care (Signed)
  Problem: Education: Goal: Knowledge of General Education information will improve Description: Including pain rating scale, medication(s)/side effects and non-pharmacologic comfort measures Outcome: Progressing   Problem: Clinical Measurements: Goal: Will remain free from infection Outcome: Progressing Goal: Respiratory complications will improve Outcome: Progressing   Problem: Health Behavior/Discharge Planning: Goal: Ability to manage health-related needs will improve Outcome: Not Progressing Not ready for discharge at this time.  Problem: Clinical Measurements: Goal: Ability to maintain clinical measurements within normal limits will improve Outcome: Not Progressing Goal: Diagnostic test results will improve Outcome: Not Progressing Heparin management & blood transfusion    Problem: Activity: Goal: Risk for activity intolerance will decrease Outcome: Not Progressing  Will increase activity as diagnostic values improve  Problem: Nutrition: Goal: Adequate nutrition will be maintained Outcome: Not Progressing NPO except meds & ice at this time

## 2022-06-15 NOTE — Progress Notes (Signed)
Ms. Propst is having significant abdominal pain this morning.  She really does have a hard time.  She is having some diarrhea.  She had vomiting last night.  We really need to get some abdominal films on her make sure she does not have any type of obstruction.  She is a little bit distended.  I do hear some bowel sounds.  I do not think she has perforated.  I also wonder about bowel ischemia.  She is on heparin right now.  She has better renal function.  Creatinine is down to 1.34.  Maybe, Interventional Radiology will be able to help with the thrombi.  I know that they were a little bit worried about her renal function yesterday.  I totally understand this.  I just hate that she is miserable.  There is no obvious bleeding.  Hemoglobin is 7.5.  She will need to be transfused.  She cannot take ESA because of her thromboembolic disease.  Her blood sugar still on the higher side.  She has not had any obvious fever.  I think cultures were all negative.  I think she is on Rocephin.  Her white cell count is 11.2.  Hemoglobin 7.5.  Platelet count is 297,000.  She has had no cough.  There is no obvious shortness of breath.  She was not all that active.  Her main problem right now clearly is this abdominal pain.  Again she may have a lot of gas.  We will have to see what abdominal film shows.  Her vital signs show temperature of 98.6.  Pulse 87.  Blood pressure 152/90.  Her lungs sound clear bilaterally.  Cardiac exam regular rate and rhythm.  There are no murmurs.  Abdomen is obese.  There is some slight distention.  Bowel sounds are decreased but present.  She has some tenderness throughout the abdomen with palpation.  There is no rigidity.  Extremity shows swelling in the legs bilaterally.  Neurological exam is nonfocal.  We really have to get to the bottom of this abdominal pain.  I really hate that she is miserable like this.  I know she is on hydromorphone.  I will know if there is any kind  of bowel ischemia.  There is no melena or bright red blood per rectum.  Again, we have to see what the abdominal x-rays show.  I am glad that her renal function is improving.  Maybe, IR will be able to help with the thromboembolic disease burden.  I think that the anemia is not from obvious bleeding.  Again she has renal insufficiency.  I know this is incredibly complicated.  I do appreciate the wonderful care that she is getting from all the staff on [address].  [name], MD  Jeneen Rinks 1:12

## 2022-06-15 NOTE — Assessment & Plan Note (Addendum)
-   Continue heparin - Consult IR, appreciate assistance - Transfer to [facility], plan to have IVC thrombectomy either over the weekend or on Monday

## 2022-06-15 NOTE — Assessment & Plan Note (Addendum)
Hypokalemia,   Renal failure has resolved, session by day of discharge creatinine was down to 1.05, potassium at 3.7, bicarb of 30.  -Outpatient follow-up.

## 2022-06-15 NOTE — Progress Notes (Signed)
PT moaning in pain rating 10/10. PT very restless and complaining of sharp abdominal pain and nausea unrelieved by zofran. PRN Dilaudid given to PT for pain relief per order. PT states minimal relief of pain. PT passing gas and had a BM 8/23. Dy Foust made aware.

## 2022-06-15 NOTE — Assessment & Plan Note (Addendum)
Extensive deep vein thrombosis, bilateral lower extremities and inferior vena cava. IVC filter.  08/28 IVC and bilateral lower extremity venogram and mechanical thrombolysis L IJ port catheter placement.   Patient was on heparin IV for anticoagulation, and transitioned subsequently to full dose enoxaparin.   Patient with persistent pain in her lower extremities and lower abdomen.  Patient was maintained on home regimen long acting oxycodone 10 mg bid, continue with short acting oxycodone 5 mg prn, and IV hydromorphone PRN.  Scheduled acetaminophen. Patient had side effects to gabapentin in the past.   Avoid non steroidal anti inflammatory medications due to full anticoagulation.   -IR had planned to remove IVC filter on 06/22/2022 however, procedure canceled per IR has residual clot noted in the femoral vein, at this time no plans to remove IVC filter at this time during this hospitalization. -Outpatient follow-up with IR.

## 2022-06-15 NOTE — Progress Notes (Signed)
ANTICOAGULATION CONSULT NOTE - Follow Up Consult  Pharmacy Consult for Heparin Indication:  VTE  Allergies  Allergen Reactions   Penicillins Shortness Of Breath    Other reaction(s): Irregular Heart Rate, Other (See Comments) Rapid heartrate    Alprazolam Hives and Other (See Comments)    Hard to arouse, unresponsiveness   Ativan [Lorazepam] Other (See Comments)    Note: tolerates midazolam fine Face & Throat Swelling.   Corticosteroids Other (See Comments)    Other reaction(s): Other (see comments) Psychotic behaviour     Erythromycin     Other reaction(s): Other (See Comments) Severe stomach pain    Prednisone Other (See Comments)    Anxiety & Nervous Breakdown.   Savella  [Milnacipran]    Prednisolone Anxiety    Patient Measurements: Height: '5\' 6"'$  (167.6 cm) Weight: 112.9 kg (248 lb 14.4 oz) IBW/kg (Calculated) : 59.3 Heparin Dosing Weight: 84.7 kg  Vital Signs: Temp: 98.6 F (37 C) (08/23 0438) Temp Source: Oral (08/23 0438) BP: 152/90 (08/23 0438) Pulse Rate: 87 (08/23 0438)  Labs: Recent Labs    06/13/22 1240 06/13/22 1240 06/13/22 1739 06/13/22 1841 06/14/22 0359 06/14/22 0900 06/14/22 1104 06/14/22 1636 06/15/22 0431  HGB 8.8*  --   --   --   --  7.3*  --   --  7.5*  HCT 29.0*  --   --   --   --  24.0*  --   --  24.7*  PLT 185  --   --   --   --  211  --   --  297  APTT  --   --   --  32  --   --   --   --   --   LABPROT  --   --   --  16.4*  --   --   --   --   --   INR  --   --   --  1.3*  --   --   --   --   --   HEPARINUNFRC  --    < >  --   --  0.25*  --  0.30 0.34 0.47  CREATININE 2.03*  --   --   --  1.88*  --   --   --  1.34*  TROPONINIHS  --   --  4  --   --   --   --   --   --    < > = values in this interval not displayed.     Estimated Creatinine Clearance: 57.6 mL/min (A) (by C-G formula based on SCr of 1.34 mg/dL (H)).   Assessment: AC/Heme: H/o DVT, BL PE and superior mesenteric vein thrombus prev on Xarelto but d/c'd in  July for GIB (pt accidentally took extra dose)  - 8/20 CT A/P:  suspicion for new bilateral pelvic and femoral vein thrombosis - 8/22: A portion of the distal IVC shows evidence of acute DVT and BL DVT's of the LE. - Has IVC filter in place from ~34yr ago (still in place per scan) - Hgb 7.5 down, Plts 297 with known IDA. Hep level 0.47  Goal of Therapy:  Heparin level 0.3-0.5 units/ml Monitor platelets by anticoagulation protocol: Yes   Plan:  - Con't IV heparin 1600 units/hr - Low goal (0.3-0.5) given recent GIB (July) - Daily HL and CBC - Transfuse 8/23.   Wendy Barber Wendy Barber, Wendy Barber Wendy Barber Wendy Barber  Wendy Barber Stillinger 06/15/2022,7:24 AM

## 2022-06-15 NOTE — Assessment & Plan Note (Addendum)
BP controlled with systolic blood pressures ranging from 130-150s.   -Patient also with complaints of abdominal pain. -Continue propranolol.

## 2022-06-15 NOTE — Assessment & Plan Note (Signed)
Due to hypotension and IVC clot

## 2022-06-15 NOTE — Progress Notes (Signed)
Renal artery duplex has been completed. Preliminary results can be found in CV Proc through chart review.   06/15/22 9:12 AM Wendy Barber RVT

## 2022-06-15 NOTE — Assessment & Plan Note (Addendum)
Patient maintained on antiplatelet therapy.

## 2022-06-15 NOTE — Assessment & Plan Note (Addendum)
Actually this was a debilitating symptom a few days ago after admission.    Had CT abdomen and pelvis that ruled out ileus or bowel obstruction, bowel ischemia, perforation.  It did show esophagitis, consistent with her symptoms of heartburn and discomfort with swallowing.  ACS ruled out, sucralfate and PPI started and symptoms improved.     - Continue sucralfate - Continue PPI BID, change back to once daily at discharge

## 2022-06-15 NOTE — Progress Notes (Signed)
Pt has a significant amount of distress this am with both nausea, & severe pain, increasingly worse from yesterday's assessment. Pt was given on Bentyl, & Ultram orally, zofran IV & dilaudid IV. Blood is double checked & started. Daughter is at bedside. Will administer & stagger oral medications as appropriate to pt's condition throughout shift.

## 2022-06-15 NOTE — Hospital Course (Addendum)
Wendy Barber was admitted to the hospital with the working diagnosis of bilateral lower extremity extensive DVT.   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 On her initial physical examination her blood pressure was 69/40, HR 88, 02 saturation 100%, lungs with no wheezing or rhonchi, heart with S1 and S2 present and rhythmic, abdomen tender to palpation in the epigastric area, no lower extremity edema.   Na 135, K 3,9, cl 103, bicarbonate 19 glucose 148 bun 42 cr 2,0 Wbc 15, hgb 8,8 plt 185  Urine analysis with SG 1,015, > 50 wbc, 11-20 rbc. 30 protein.   Abdominal radiograph with increased bowel gas suggesting ileus.   CT abdomen and pelvis with bilateral pelvic and femoral venous thrombosis, IVC filter in place. Positive cirrhosis.   Subsequent imaging showed bilateral DVT extending up to the IVC.  Started on heparin and admitted.  IR and Oncology consulted.  EKG 74 bpm, left axis deviation, normal intervals, sinus rhythm with ST depression and T wave inversions in V3 to V6.   CT venogram with thrombosed inferior venal cava and bilateral common iliac, external iliac, common femoral and visualized femoral veins.  Thrombosed infrarenal IVC filter with small amount of thrombus extending superior to the filter apex. Renal veins patent.  Mild right hydronephrosis.   Renal arterial US with no evidence of renal artery stenosis.   Patient was placed on heparin drip for anticoagulation and IR was consulted.  08/26 transferred to [facility] for thrombectomy.  08/27 continue to have pain lower extremities with movement. 08/28 IVC and bilateral lower extremity venogram and mechanical thrombectomy. L IJ Port  catheter placement.  08/29 transitioned to enoxaparin from IV heparin

## 2022-06-15 NOTE — Assessment & Plan Note (Addendum)
-   Patient maintained on home regimen bupropion, BuSpar, citalopram.

## 2022-06-15 NOTE — Assessment & Plan Note (Addendum)
Resolved

## 2022-06-15 NOTE — Progress Notes (Signed)
Initial Nutrition Assessment  DOCUMENTATION CODES:   Obesity unspecified  INTERVENTION:  - diet advancement as medically feasible.  - will order 1 tablet multivitamin with minerals/day.  - complete NFPE when feasible.    NUTRITION DIAGNOSIS:   Increased nutrient needs related to chronic illness, cancer and cancer related treatments as evidenced by estimated needs.  GOAL:   Patient will meet greater than or equal to 90% of their needs  MONITOR:   Diet advancement, PO intake, Labs, Weight trends  REASON FOR ASSESSMENT:   Malnutrition Screening Tool  ASSESSMENT:   61 y.o. 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 She presented to the ED due to generalized weakness and multiple near syncopal episodes at home. While in the ED she had a syncopal episode which lasted 30-45 seconds. She occasional experiences BLE edema. Patient experiences chronic abdominal pain and diarrhea x6-8 times daily. She was admitted d/t syncope.  Patient is out of the room to CT. She was previously on a Carb Modified diet and ate 100% of dinner on 8/21 (606 kcal and 24 grams protein) and 90% of lunch on 8/22 ( 462 kcal and 13 grams protein). She was made NPO today at [phone].  She was seen by a [place] RD on 02/08/21 at which time she reported that she does not like any of the oral nutrition supplements.  Weight today is 249 lb, weight yesterday was 244 lb, and weight on 8/21 was 241 lb. These weights are up from stable weight of 227-229 lb from 03/02/22-05/30/22. Non-pitting edema to BLE documented in the edema section of flow sheet.    Labs reviewed; CBGs: 158, 136, 135 mg/dl, BUN: 33 mg/dl, creatinine: 1.34 mg/dl, GFR: 46 ml/min. Medications reviewed; 20 mg pepcid  BID, 20 mg IV lasix x2 doses 8/23, 40 mg oral protonix/day.    NUTRITION - FOCUSED PHYSICAL EXAM:  Patient out of the room to CT.  Diet Order:   Diet Order             Diet NPO time specified Except for: Ice Chips, Sips with Meds  Diet effective midnight                   EDUCATION NEEDS:   No education needs have been identified at this time  Skin:  Skin Assessment: Reviewed RN Assessment  Last BM:  PTA/unknown  Height:   Ht Readings from Last 1 Encounters:  06/13/22 '5\' 6"'$  (1.676 m)    Weight:   Wt Readings from Last 1 Encounters:  06/15/22 112.9 kg     BMI:  Body mass index is 40.17 kg/m.  Estimated Nutritional Needs:  Kcal:  2100-2300 kcal Protein:  105-120 grams Fluid:  >/= 2.3 L/day      Jarome Matin, MS, RD, LDN, CNSC Registered Dietitian II Inpatient Clinical Nutrition RD pager # and on-call/weekend pager # available in [facility]

## 2022-06-15 NOTE — Assessment & Plan Note (Addendum)
-  Status post IV iron.  -Hemoglobin stabilized at 10.6 by day of discharge.  -Patient with no significant overt bleeding noted.  -Anemia in part likely due to procedures done.  -Status post 5 units packed red blood cells during this hospitalization.   -Hemoglobin stabilized at 10.6 by day of discharge. -Outpatient follow-up with hematology.

## 2022-06-15 NOTE — Progress Notes (Signed)
Progress Note   Patient: Wendy Barber NAT:[id number] DOB: August 08, 1961 DOA: 06/13/2022     1 DOS: the patient was seen and examined on 06/15/2022 at 10:20AM and 1:30PM      Brief hospital course: 61 y.o. 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  She was noted to be orthostatic.  There was concern raised for venous thrombosis of her iliac veins and lower extremity veins.  She was started on IV heparin.  She was found to have acute kidney injury.  She was hospitalized for further management.        Assessment and Plan: * IVC thrombosis (HCC)    DVT, bilateral lower limbs (HCC)    Bilateral iliac artery thrombosis (HCC) - Continue heparin - Consult IR, appreciate recommendations      Heartburn Abdomen x-ray from this morning shows possible ileus, CT abdomen pelvis this afternoon shows no findings of ileus or bowel obstruction, bowel ischemia, perforation.  Does show esophagitis, and to me her symptomology is primarily of heartburn.  EKG unchanged from admission, troponins negative, anginal equivalent, ACS ruled out. - Start sucralfate - Change PPI to IV - Viscous lidocaine if the above is not helpful - IV fluids     CAD (coronary artery disease) - Hold propranolol  Obesity, morbid, BMI 40.0-49.9 (HCC) BMI 40.1  Hypotension Resolved  Syncope Due to hypotension and IVC clot  Anxiety - Continue bupropion, BuSpar, citalopram  AKI (acute kidney injury) (HCC) Creatinine >2 on admission, improved to 1.3 today with fluids, baseline is 0.8 - Continue fluids - Avoid nephrotoxins  Iron  deficiency anemia due to chronic blood loss No clinical bleeding.  Hemoglobin 7.5 today, slightly up from yesterday. - Transfuse 1 unit today - Trend CBC  Hypokalemia - Supplement K  DM2 (diabetes mellitus, type 2) (HCC) Glucose high normal off insulin here - Monitor glucose - No need for sliding scale corrections at  HTN (hypertension) Blood pressure elevated today - Hold propranolol, until hemodynamics stabilize          Subjective: Patient with hypertension, epigastric discomfort with eating and swallowing.  No vomiting, no diarrhea, no abdominal pain, no stools.  No bleeding or syncope.  No confusion.     Physical Exam: Vitals:   06/15/22 1209 06/15/22 1312 06/15/22 1433 06/15/22 1513  BP: (!) 157/91 (!) 149/91 (!) 144/83 (!) 152/78  Pulse: 85 91 89 90  Resp: 18 (!) '24 20 20  '$ Temp: 98.9 F (37.2 C) 99.1 F (37.3 C) 98.6 F (37 C) 98.3 F (36.8 C)  TempSrc: Oral Oral Oral Oral  SpO2: 100% 95% 96% 94%  Weight:      Height:       Obese adult female, lying in bed, appears uncomfortable RRR, no murmurs, no peripheral edema Respiratory rate normal, lungs clear without rales or wheezes Abdomen soft, no tenderness palpation, no rigidity, she has voluntary guarding throughout, she is maybe mildly distended, difficult to tell due to body habitus Attention normal, affect anxious and in pain, judgment and insight appear normal, face symmetric, speech fluent    Data Reviewed: Discussed with interventional radiology Hematology notes reviewed Hemogram shows hemoglobin up to 7.5, white blood cell count 11 EKG shows RSR  prime pattern, no ST changes, no changes from previous Abdomen x-ray shows possible ileus CT venogram report reviewed, shows extensive clot from the IVC filter elevated out of the legs, no signs of bowel ischemia, perforation, hemorrhage, infection, new tumor  Creatinine down to 1.3 on basic metabolic panel Platelets normal    Family  Communication: Husband and daughter at the bedside    Disposition: Status is: Inpatient Patient was admitted with extensive IVC clot  She is on heparin, she will need thrombectomy by interventional radiology, which is planned in the coming days.  Today she has severe esophagitis.        Author: Edwin Dada, MD 06/15/2022 5:06 PM  For on call review [email or web address].

## 2022-06-15 NOTE — Assessment & Plan Note (Addendum)
BMI 40.1 Lifestyle modification Outpatient follow-up with PCP.

## 2022-06-15 NOTE — Assessment & Plan Note (Signed)
Resolved

## 2022-06-15 NOTE — Assessment & Plan Note (Addendum)
Glucose has been well controlled, holding on insulin therapy. Fasting glucose is 116 mg/dl today.

## 2022-06-16 DIAGNOSIS — N179 Acute kidney failure, unspecified: Secondary | ICD-10-CM | POA: Diagnosis not present

## 2022-06-16 DIAGNOSIS — I82220 Acute embolism and thrombosis of inferior vena cava: Secondary | ICD-10-CM | POA: Diagnosis not present

## 2022-06-16 DIAGNOSIS — N390 Urinary tract infection, site not specified: Secondary | ICD-10-CM | POA: Diagnosis present

## 2022-06-16 DIAGNOSIS — F419 Anxiety disorder, unspecified: Secondary | ICD-10-CM | POA: Diagnosis not present

## 2022-06-16 DIAGNOSIS — I700 Atherosclerosis of aorta: Secondary | ICD-10-CM | POA: Diagnosis not present

## 2022-06-16 LAB — BASIC METABOLIC PANEL
Anion gap: 11 (ref 5–15)
BUN: 28 mg/dL — ABNORMAL HIGH (ref 6–20)
CO2: 22 mmol/L (ref 22–32)
Calcium: 9.1 mg/dL (ref 8.9–10.3)
Chloride: 109 mmol/L (ref 98–111)
Creatinine, Ser: 1.39 mg/dL — ABNORMAL HIGH (ref 0.44–1.00)
GFR, Estimated: 44 mL/min — ABNORMAL LOW (ref >60)
Glucose, Bld: 139 mg/dL — ABNORMAL HIGH (ref 70–99)
Potassium: 3.3 mmol/L — ABNORMAL LOW (ref 3.5–5.1)
Sodium: 142 mmol/L (ref 135–145)

## 2022-06-16 LAB — GLUCOSE, CAPILLARY
Glucose-Capillary: 139 mg/dL — ABNORMAL HIGH (ref 70–99)
Glucose-Capillary: 127 mg/dL — ABNORMAL HIGH (ref 70–99)
Glucose-Capillary: 114 mg/dL — ABNORMAL HIGH (ref 70–99)
Glucose-Capillary: 112 mg/dL — ABNORMAL HIGH (ref 70–99)
Glucose-Capillary: 138 mg/dL — ABNORMAL HIGH (ref 70–99)

## 2022-06-16 LAB — BPAM RBC
Blood Product Expiration Date: 202309172359
Blood Product Expiration Date: 202309182359
ISSUE DATE / TIME: 202308230923
ISSUE DATE / TIME: 202308231449
Unit Type and Rh: 6200
Unit Type and Rh: 6200

## 2022-06-16 LAB — TYPE AND SCREEN
ABO/RH(D): A POS
Antibody Screen: NEGATIVE
Unit division: 0
Unit division: 0

## 2022-06-16 LAB — URINE CULTURE: Culture: >=100000 — AB

## 2022-06-16 LAB — CBC
HCT: 33.4 % — ABNORMAL LOW (ref 36.0–46.0)
Hemoglobin: 10.5 g/dL — ABNORMAL LOW (ref 12.0–15.0)
MCH: 28.8 pg (ref 26.0–34.0)
MCHC: 31.4 g/dL (ref 30.0–36.0)
MCV: 91.8 fL (ref 80.0–100.0)
Platelets: 354 10*3/uL (ref 150–400)
RBC: 3.64 MIL/uL — ABNORMAL LOW (ref 3.87–5.11)
RDW: 17.9 % — ABNORMAL HIGH (ref 11.5–15.5)
WBC: 12.1 10*3/uL — ABNORMAL HIGH (ref 4.0–10.5)
nRBC: 0.5 % — ABNORMAL HIGH (ref 0.0–0.2)

## 2022-06-16 LAB — HEPARIN LEVEL (UNFRACTIONATED): Heparin Unfractionated: 0.47 IU/mL (ref 0.30–0.70)

## 2022-06-16 MED ORDER — SODIUM CHLORIDE 0.9 % IV SOLN: 125.0000 mg | Freq: Every day | INTRAVENOUS | Status: DC

## 2022-06-16 MED ORDER — ORAL CARE MOUTH RINSE: 15.0000 mL | OROMUCOSAL | Status: DC | PRN

## 2022-06-16 MED ORDER — POTASSIUM CHLORIDE 10 MEQ/100ML IV SOLN
10.0000 meq | INTRAVENOUS | Status: AC
  Administered 2022-06-16 (×4): 10 meq via INTRAVENOUS
  Filled 2022-06-16 (×3): qty 100

## 2022-06-16 MED ORDER — SODIUM CHLORIDE 0.9 % IV SOLN
510.0000 mg | Freq: Once | INTRAVENOUS | Status: AC
  Administered 2022-06-16: 510 mg via INTRAVENOUS
  Filled 2022-06-16: qty 17

## 2022-06-16 NOTE — Plan of Care (Signed)
  Problem: Education: Goal: Knowledge of General Education information will improve Description: Including pain rating scale, medication(s)/side effects and non-pharmacologic comfort measures Outcome: Progressing   Problem: Health Behavior/Discharge Planning: Goal: Ability to manage health-related needs will improve Outcome: Progressing   Problem: Clinical Measurements: Goal: Ability to maintain clinical measurements within normal limits will improve Outcome: Progressing Goal: Will remain free from infection Outcome: Progressing Goal: Diagnostic test results will improve Outcome: Progressing Goal: Respiratory complications will improve Outcome: Progressing   Problem: Nutrition: Goal: Adequate nutrition will be maintained Outcome: Progressing

## 2022-06-16 NOTE — Assessment & Plan Note (Addendum)
Status post 5-day course of IV Rocephin.   -Patient with some complaints of dysuria although improved and as such repeat urinalysis was done with trace leukocytes, nitrite negative, 6-10 WBCs.   -Urine cultures with multiple species. -No need for antibiotics at this time.

## 2022-06-16 NOTE — Progress Notes (Signed)
ANTICOAGULATION CONSULT NOTE - Follow Up Consult  Pharmacy Consult for Heparin Indication:  VTE  Allergies  Allergen Reactions   Penicillins Shortness Of Breath    Other reaction(s): Irregular Heart Rate, Other (See Comments) Rapid heartrate    Alprazolam Hives and Other (See Comments)    Hard to arouse, unresponsiveness   Ativan [Lorazepam] Other (See Comments)    Note: tolerates midazolam fine Face & Throat Swelling.   Corticosteroids Other (See Comments)    Other reaction(s): Other (see comments) Psychotic behaviour     Erythromycin     Other reaction(s): Other (See Comments) Severe stomach pain    Prednisone Other (See Comments)    Anxiety & Nervous Breakdown.   Savella  [Milnacipran]    Prednisolone Anxiety    Patient Measurements: Height: '5\' 6"'$  (167.6 cm) Weight: 114.3 kg (251 lb 15.8 oz) IBW/kg (Calculated) : 59.3 Heparin Dosing Weight: 84.7 kg  Vital Signs: Temp: 98.4 F (36.9 C) (08/24 0359) Temp Source: Oral (08/24 0359) BP: 138/82 (08/24 0359) Pulse Rate: 78 (08/24 0359)  Labs: Recent Labs    06/13/22 1739 06/13/22 1841 06/14/22 0359 06/14/22 0900 06/14/22 1104 06/14/22 1636 06/15/22 0431 06/15/22 1226 06/16/22 0417  HGB  --   --   --  7.3*  --   --  7.5*  --  10.5*  HCT  --   --   --  24.0*  --   --  24.7*  --  33.4*  PLT  --   --   --  211  --   --  297  --  354  APTT  --  32  --   --   --   --   --   --   --   LABPROT  --  16.4*  --   --   --   --   --   --   --   INR  --  1.3*  --   --   --   --   --   --   --   HEPARINUNFRC  --   --  0.25*  --    < > 0.34 0.47  --  0.47  CREATININE  --   --  1.88*  --   --   --  1.34*  --  1.39*  TROPONINIHS 4  --   --   --   --   --   --  3  --    < > = values in this interval not displayed.     Estimated Creatinine Clearance: 55.9 mL/min (A) (by C-G formula based on SCr of 1.39 mg/dL (H)).   Assessment: AC/Heme: H/o DVT, BL PE and superior mesenteric vein thrombus prev on Xarelto but d/c'd in  July for GIB (pt accidentally took extra dose)  - 8/20 CT A/P:  suspicion for new bilateral pelvic and femoral vein thrombosis - 8/22: A portion of the distal IVC shows evidence of acute DVT and BL DVT's of the LE. - Has IVC filter in place from ~21yr ago (still in place per scan) - Hgb 7.5 (transfuse)>10.5, Plts 354 with known IDA. Hep level 0.47 again today.  Goal of Therapy:  Heparin level 0.3-0.5 units/ml Monitor platelets by anticoagulation protocol: Yes   Plan:  - Con't IV heparin 1600 units/hr - Low goal (0.3-0.5) given recent GIB (July) - Daily HL and CBC   Wendy Aiken S. Wendy Barber PharmD, BCPS Clinical Staff Pharmacist [email or web address]  Wendy Barber CThe Timken Company8/24/2023,7:30  AM

## 2022-06-16 NOTE — Progress Notes (Signed)
She is feeling better this morning.  There is not as much abdominal discomfort.  She had a plain film of the abdomen yesterday.  This seemed to show possible ileus.  However, she then had a CT of the abdomen pelvis.  This did not show any ileus or obstruction or ischemia.  She continues on the heparin infusion.  She does have the bilateral femoral vein thrombi in the blocked IVC filter.  I think this is somehow going to have to be treated.  I will know if a thrombectomy could be performed.  And her renal function is still marginal.  She did get a blood transfusion yesterday.  Today, her hemoglobin is 10.5 hematocrit 33.4.  Platelet count 3 54,000.  She  does have iron deficiency anemia.  Her iron saturation is only 8%.  I think we will going to have to see about giving her some IV iron.  She really is not out of bed.  She really did not eat all that much yesterday.  Her BUN is 20 creatinine 1.39.  This is about the same as yesterday.  Her potassium is 3.3.  She has had no fever.  Shockingly, she does have gram-negative rods in her urine.  She is on Rocephin.  We will have to see what the bacteria cultured out as.  She had E. coli in her urine about a year ago.  I suppose this may also be E. coli.  The E. coli in 2022 was sensitive to Rocephin.  She is afebrile.  Her temperature is 98.4.  Pulse 78.  Blood pressure 138/82.  Her lungs are clear bilaterally.  Cardiac exam regular rate and rhythm.  Abdomen is soft.  Bowel sounds are slightly decreased.  There is still some tenderness to palpation throughout the abdomen.  She has no obvious fluid wave.  There is no obvious abdominal mass.  Extremities does show some swelling in the legs bilaterally.  Neurological exam is nonfocal.  We will have to see if she can have the bilateral lower extremity thrombi removed.  I know she is on heparin right now.  Pharmacy is doing a great job with the heparin levels.  We will have to see what the urine culture does  show.  There is still a lot going on with Wendy Barber.  I do appreciate everybody's help on [place], MD  Psalm 6:2

## 2022-06-16 NOTE — Plan of Care (Signed)
  Problem: Clinical Measurements: Goal: Diagnostic test results will improve Outcome: Progressing Goal: Respiratory complications will improve Outcome: Progressing   Problem: Coping: Goal: Level of anxiety will decrease Outcome: Progressing   

## 2022-06-16 NOTE — Progress Notes (Signed)
Referring Physician(s): Ennever,P/Danford,C  Supervising Physician: Corrie Mckusick  Patient Status:  [facility] - In-pt  Chief Complaint: Epigastric pain/burning, bilateral lower extremity swelling   Subjective: Pt familiar to IR service from random  liver biopsy in 2021, midline abdominal abscess drain placement on 01/27/2021 (enterocutaneous fistula) with repositioning and upsizing on 02/02/2021 followed by removal on 03/10/2021.  He has a past medical history significant for osteoarthritis, chronic back pain, pseudomyxoma peritonei static appendiceal cancer, chronic fatigue syndrome, fibromyalgia, diabetes, DVT/PE/IVC filter (2005) placement, anemia.  She was admitted to [facility], ED on 8/21 with generalized weakness and near syncopal episodes at home, bilateral lower extremity edema, bilateral hip pain.  She had originally been taking Xarelto however that was discontinued due to prior GI bleeding.  Latest imaging has revealed CT head with no acute intracranial abnormality ,stable partially calcified left posterior fossa meningioma, no significant carotid artery stenosis. CT abdomen pelvis on 8/21 revealed:   1. Perivascular edema within both groins and superficial pelvic sidewalls. Suspect bilateral pelvic and femoral venous thrombosis, suboptimally evaluated on this noncontrast exam. IVC filter in place. Ultrasound or dedicated CT venography versus MR venography could confirm. 2. Otherwise, low sensitivity exam secondary to lack of oral or IV contrast. 3. Extensive surgical changes throughout the abdomen, without specific evidence of metastatic disease. 4. Cirrhosis 5. Age advanced coronary artery atherosclerosis. Recommend assessment of coronary risk factors. 6.  Aortic Atherosclerosis  CT venogram of abdomen pelvis performed on 8/23 revealed:   1. Thrombosed inferior vena cava and bilateral common iliac, external iliac, common femoral, and visualized femoral veins. 2. Thrombosed  infrarenal IVC filter with small amount of thrombus extending superior to the filter apex. Renal veins are patent. 3. Interval development of mild right hydronephrosis with transition at the ureteropelvic junction. 4. Interval development of moderate distal esophageal wall thickening suspicious for esophagitis  Bilateral lower extremity venous Doppler on 8/22 revealed:   RIGHT: - Findings consistent with acute deep vein thrombosis involving the right common femoral vein, SF junction, right femoral vein, right proximal profunda vein, right popliteal vein, right gastrocnemius veins, and external iliac vein. - Findings consistent with acute superficial vein thrombosis involving the right great saphenous vein. LEFT: - Findings consistent with acute deep vein thrombosis involving the left common femoral vein, SF junction, left femoral vein, left proximal profunda vein, left popliteal vein, and left gastrocnemius veins. - Findings consistent with acute superficial vein thrombosis involving the left small saphenous vein.  Renal Doppler yesterday revealed:  Renal: Right: Normal size right kidney. Normal right Resisitive Index. No evidence of right renal artery stenosis. RRV flow present. Left: Normal size of left kidney. Normal left Resistive Index. No evidence of left renal artery stenosis. LRV flow present.   She is currently afebrile, on IV heparin, potassium 3.3, creatinine 1.39, WBC 12.1, hemoglobin 10.5, platelets 354k, urine culture with E. Coli- on IV rocephin; request now received for consideration of bilateral lower extremity venography with possible thrombectomy, possible IVC filter removal/replacement.  Currently denies fever, chest pain, worsening dyspnea, cough, vomiting or bleeding.  She does have some occasional nausea, persistent gastric discomfort but improving ,bilateral lower extremity swelling.   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[place]) 03/19/2020   Chronic fatigue syndrome    Diabetes mellitus without complication (HCC)    DVT of deep femoral  vein, left ([place]) 03/19/2020   Fibromyalgia    Goals of care, counseling/discussion 03/19/2020   Hypertension    Iron deficiency anemia due to chronic blood loss 05/14/2021   Malignant pseudomyxoma peritonei ([place]) 03/19/2020   Pernicious anemia 05/14/2021   Presence of IVC filter 03/19/2020   Pulmonary embolism, bilateral ([place]) 03/19/2020   Past Surgical History:  Procedure Laterality Date   ABDOMINAL HYSTERECTOMY     ABDOMINAL SURGERY     ACHILLES TENDON REPAIR     APPENDECTOMY     ARTHROPLASTY     CARPAL TUNNEL RELEASE     CHOLECYSTECTOMY     IR CATHETER TUBE CHANGE  02/02/2021   IR CATHETER TUBE CHANGE  02/25/2021   IR RADIOLOGIST EVAL & MGMT  02/24/2021   IR RADIOLOGIST EVAL & MGMT  03/10/2021   IR US GUIDE BX ASP/DRAIN  11/25/2019   JOINT REPLACEMENT     KNEE ARTHROSCOPY     ORIF ANKLE FRACTURE Right 08/27/2019   Procedure: OPEN REDUCTION INTERNAL FIXATION RIGHT ANKLE FRACTURE;  Surgeon: Renette Butters, MD;  Location: [facility] ORS;  Service: Orthopedics;  Laterality: Right;   perineorrophy     TONGUE BIOPSY        Allergies: Penicillins, Alprazolam, Ativan [lorazepam], Corticosteroids, Erythromycin, Prednisone, Savella  [milnacipran], and Prednisolone  Medications: Prior to Admission medications   Medication Sig Start Date End Date Taking? Authorizing Provider  alum & mag hydroxide-simeth (MAALOX/MYLANTA) 200-200-20 MG/5ML suspension Take 15 mLs by mouth every 6 (six) hours as needed for indigestion or heartburn. 05/08/22  Yes Aline August, MD  buPROPion (WELLBUTRIN XL) 150 MG 24 hr tablet Take 1 tablet (150 mg total) by mouth daily. 04/27/22  Yes White, Aaron Edelman A, NP  busPIRone (BUSPAR) 15 MG tablet Take 1 tablet (15 mg total) by mouth 3 (three) times daily. 04/05/22  Yes  Lesle Chris A, NP  citalopram (CELEXA) 40 MG tablet Take 1 tablet (40 mg total) by mouth daily. 04/27/22  Yes Elwanda Brooklyn, NP  diazepam (VALIUM) 5 MG tablet Take 1 tablet 30-40 minutes prior to MRI 06/13/22  Yes Jaffe, Adam R, DO  dicyclomine (BENTYL) 20 MG tablet Take 1 tablet (20 mg total) by mouth in the morning, at noon, in the evening, and at bedtime. 05/08/22  Yes Aline August, MD  diphenhydrAMINE (SOMINEX) 25 MG tablet Take 25 mg by mouth daily as needed for itching.   Yes [provider]  diphenoxylate-atropine (LOMOTIL) 2.5-0.025 MG tablet TAKE 2 TABLETS IN THE MORNING, AT NOON, IN THE EVENING AND AT BEDTIME Patient taking differently: Take 2 tablets by mouth 4 (four) times daily. 04/22/22  Yes Ennever, Rudell Cobb, MD  eszopiclone (LUNESTA) 2 MG TABS tablet TAKE 1 TABLET(2 MG) BY MOUTH AT BEDTIME AS NEEDED FOR SLEEP Patient taking differently: Take 2 mg by mouth at bedtime. 06/07/22  Yes White, Aaron Edelman A, NP  famotidine (PEPCID) 20 MG tablet TAKE 2 TABLETS TWICE A DAY Patient taking differently: Take 40 mg by mouth 2 (two) times daily. 03/01/22  Yes Ennever, Rudell Cobb, MD  loperamide (IMODIUM) 2 MG capsule Take 2 capsules (4 mg total) by mouth 4 (four) times daily as needed for diarrhea or loose stools. 05/08/22  Yes Aline August, MD  meclizine (ANTIVERT) 25 MG tablet Take 1 tablet (25 mg total) by mouth 3 (three) times daily as needed for dizziness. Patient taking differently: Take 25 mg by mouth 3 (three) times daily. 05/30/22  Yes [name], MD  ondansetron (ZOFRAN) 8 MG  tablet Take 8 mg by mouth every 8 (eight) hours as needed for nausea. 03/03/21  Yes [provider]  orphenadrine (NORFLEX) 100 MG tablet TAKE 1 TABLET(100 MG) BY MOUTH AT BEDTIME AS NEEDED FOR MUSCLE SPASMS 06/06/22  Yes Ennever, Rudell Cobb, MD  oxyCODONE (ROXICODONE) 5 MG immediate release tablet Take 1 tablet (5 mg total) by mouth every 4 (four) hours as needed for severe pain or breakthrough pain. 06/04/22   Yes Shah, Pratik D, DO  pantoprazole (PROTONIX) 40 MG tablet Take 40 mg by mouth every morning. 01/01/21  Yes [provider]  promethazine (PHENERGAN) 12.5 MG tablet Take 1 tablet (12.5 mg total) by mouth 2 (two) times daily as needed. Patient taking differently: Take 12.5 mg by mouth 2 (two) times daily as needed for nausea. 04/21/22  Yes [name], MD  propranolol (INDERAL) 20 MG tablet Take 1 tablet (20 mg total) by mouth 2 (two) times daily. 04/21/22  Yes [name], MD  traMADol (ULTRAM) 50 MG tablet Take 1 tablet (50 mg total) by mouth every 6 (six) hours as needed for moderate pain. 05/08/22  Yes Aline August, MD  ferrous sulfate 325 (65 FE) MG tablet Take 1 tablet (325 mg total) by mouth daily. Patient not taking: Reported on 06/13/2022 06/03/22 06/03/23  Heath Lark D, DO  folic acid (FOLVITE) 1 MG tablet Take 2 tablets (2 mg total) by mouth daily. Patient not taking: Reported on 06/13/2022 05/09/22   Aline August, MD  hydrocortisone (ANUSOL-HC) 2.5 % rectal cream Place rectally 2 (two) times daily. Patient not taking: Reported on 06/13/2022 05/08/22   Aline August, MD  potassium chloride SA (KLOR-CON M) 10 MEQ tablet Take 2 tablets (20 mEq total) by mouth daily. Patient not taking: Reported on 06/13/2022 05/12/22   [name], MD     Vital Signs: BP 125/75 (BP Location: Left Arm)   Pulse 72   Temp 98.3 F (36.8 C) (Oral)   Resp 16   Ht '5\' 6"'$  (1.676 m)   Wt 251 lb 15.8 oz (114.3 kg)   SpO2 98%   BMI 40.67 kg/m   Physical Exam awake, alert.  Chest clear to auscultation bilaterally.  Heart with regular rate and rhythm.  Abdomen obese, soft, positive bowel sounds, some mild epigastric tenderness to palpation.  Moderate bilateral lower extremity edema noted with intact distal pulses  Imaging: VAS US RENAL ARTERY DUPLEX  Result Date: 06/15/2022 ABDOMINAL VISCERAL Patient Name:  Wendy Barber  Date of Exam:   06/15/2022 Medical Rec #: [id number]        Accession #:    [id number] Date of Birth: [birth year]       Patient Gender: F Patient Age:   17 years Exam Location[facility]  [facility] Procedure:      VAS US RENAL ARTERY DUPLEX Referring Phys: [id number] Bonnielee Haff -------------------------------------------------------------------------------- Indications: AKI High Risk Factors: Hypertension, Diabetes. Limitations: Air/bowel gas, obesity, patient discomfort and Multiple abdominal surgeries. Comparison Study: No prior studies. Performing Technologist: Oliver Hum RVT  Examination Guidelines: A complete evaluation includes B-mode imaging, spectral Doppler, color Doppler, and power Doppler as needed of all accessible portions of each vessel. Bilateral testing is considered an integral part of a complete examination. Limited examinations for reoccurring indications may be performed as noted.  Duplex Findings: +--------------------+--------+--------+------+------------------+ Mesenteric          PSV cm/sEDV cm/sPlaque     Comments      +--------------------+--------+--------+------+------------------+ Aorta Mid  76      12                            +--------------------+--------+--------+------+------------------+ Celiac Artery Origin                      Unable to insonate +--------------------+--------+--------+------+------------------+ SMA Origin                                Unable to insonate +--------------------+--------+--------+------+------------------+    +------------------+--------+--------+-------+ Right Renal ArteryPSV cm/sEDV cm/sComment +------------------+--------+--------+-------+ Origin               98      19           +------------------+--------+--------+-------+ Proximal            103      30           +------------------+--------+--------+-------+ Mid                  55      22           +------------------+--------+--------+-------+ Distal               58      14            +------------------+--------+--------+-------+ +-----------------+--------+--------+-------+ Left Renal ArteryPSV cm/sEDV cm/sComment +-----------------+--------+--------+-------+ Origin              68      17           +-----------------+--------+--------+-------+ Proximal            68      20           +-----------------+--------+--------+-------+ Mid                 71      13           +-----------------+--------+--------+-------+ Distal              51      17           +-----------------+--------+--------+-------+ +------------+--------+--------+----+-----------+--------+--------+----+ Right KidneyPSV cm/sEDV cm/sRI  Left KidneyPSV cm/sEDV cm/sRI   +------------+--------+--------+----+-----------+--------+--------+----+ Upper Pole  43      14      0.68Upper Pole 56      18      0.69 +------------+--------+--------+----+-----------+--------+--------+----+ Mid         53      13      0.75Mid        54      20      0.64 +------------+--------+--------+----+-----------+--------+--------+----+ Lower Pole  56      22      0.60Lower Pole 46      15      0.68 +------------+--------+--------+----+-----------+--------+--------+----+ Hilar       53      20      0.62Hilar      91      28      0.69 +------------+--------+--------+----+-----------+--------+--------+----+ +------------------+----+------------------+----+ Right Kidney          Left Kidney            +------------------+----+------------------+----+ RAR                   RAR                    +------------------+----+------------------+----+  RAR (manual)      1.36RAR (manual)      1.2  +------------------+----+------------------+----+ Cortex                Cortex                 +------------------+----+------------------+----+ Cortex thickness      Corex thickness        +------------------+----+------------------+----+ Kidney length (cm)9.50Kidney length  (cm)8.00 +------------------+----+------------------+----+  Summary: Renal:  Right: Normal size right kidney. Normal right Resisitive Index. No        evidence of right renal artery stenosis. RRV flow present. Left:  Normal size of left kidney. Normal left Resistive Index. No        evidence of left renal artery stenosis. LRV flow present.  *See table(s) above for measurements and observations.  Diagnosing physician: Harold Barban MD  Electronically signed by Harold Barban MD on 06/15/2022 at 8:31:54 PM.    Final    CT VENOGRAM ABD/PEL  Result Date: 06/15/2022 CLINICAL DATA:  IVC thrombosis EXAM: CT VENOGRAM ABDOMEN AND PELVIS TECHNIQUE: Multidetector CT imaging of the abdomen and pelvis was performed using the standard protocol during bolus administration of intravenous contrast. Multiplanar reconstructed images and MIPs were obtained and reviewed to evaluate the vascular anatomy. RADIATION DOSE REDUCTION: This exam was performed according to the departmental dose-optimization program which includes automated exposure control, adjustment of the mA and/or kV according to patient size and/or use of iterative reconstruction technique. CONTRAST:  171m OMNIPAQUE IOHEXOL 350 MG/ML SOLN COMPARISON:  CT abdomen pelvis 06/12/2022 FINDINGS: VASCULAR Abdominal aorta is normal in caliber. Hepatic, portal, and superior mesenteric veins are patent. Infrarenal IVC filter is present. The legs of the filter extend outside the IVC. The filter is thrombosed with small amount of thrombus extending superior to the filter, approximately 2.5 cm superior to the apex of the filter. Main renal veins are patent. The right ovarian vein is dilated with multiple right retroperitoneal varices. Infrarenal IVC is mildly expanded and thrombosed. Bilateral common iliac, external iliac, common femoral, and visualized femoral veins are mildly expanded and thrombosed. Mild fat stranding seen adjacent to the thrombosed venous segments.  NON-VASCULAR Lower chest: No acute abnormality. Hepatobiliary: No focal liver abnormality is seen. Status post cholecystectomy. No biliary dilatation. Pancreas: Unremarkable. No pancreatic ductal dilatation or surrounding inflammatory changes. Spleen: Status post splenectomy. Adrenals/Urinary Tract: Adrenal glands are normal. Interval development of mild right hydronephrosis with transition zone at the ureteropelvic junction. Kidneys and ureters otherwise unremarkable. Bladder is normal. Stomach/Bowel: Moderate distal esophageal wall thickening is new since recent examination from 06/13/2022 and is suspicious for esophagitis.Surgical clips and calcifications again seen along the lesser curvature of the stomach. Mild diffuse distention of the colon with liquid content, consistent with diarrhea. Anastomotic sutures noted at the rectum, cecum, and mid abdomen small bowel. Lymphatic: No enlarged abdominal or pelvic lymph nodes. Reproductive: Status post hysterectomy. No adnexal masses. Other: Mild subcutaneous and edema of the upper thighs bilaterally. Musculoskeletal: Posterior fusion changes present at L4-L5. Review of the MIP images confirms the above findings. IMPRESSION: 1. Thrombosed inferior vena cava and bilateral common iliac, external iliac, common femoral, and visualized femoral veins. 2. Thrombosed infrarenal IVC filter with small amount of thrombus extending superior to the filter apex. Renal veins are patent. 3. Interval development of mild right hydronephrosis with transition at the ureteropelvic junction. 4. Interval development of moderate distal esophageal wall thickening suspicious for esophagitis. Electronically Signed   By: FDanie BinderD.  On: 06/15/2022 12:38   DG Abd Portable 1V  Result Date: 06/15/2022 CLINICAL DATA:  61 year old female with abdominal pain and distension. Appendiceal cancer with metastasis, Pseudomyxoma peritonei. EXAM: PORTABLE ABDOMEN - 1 VIEW COMPARISON:  CT Abdomen  and Pelvis 06/13/2022, and earlier. FINDINGS: Portable AP supine views at 0624 hours. Increasing bowel gas since the CT on 06/13/2022 appears to be primarily within the colon. Gas is present in the sigmoid and rectum. No definite dilated small bowel at this time. IVC filter and lower lumbar spine fusion hardware redemonstrated. Stable cholecystectomy clips. Chronic surgical clips in the left mid abdomen and right lower quadrant also. No definite pneumoperitoneum on these supine views. Lumbar spine degeneration with levoconvex scoliosis. No acute osseous abnormality identified. IMPRESSION: Diffusely increased large bowel gas since 06/13/2022 favors Ileus. Distal large bowel obstruction felt less likely, and no definite small bowel dilatation at this time. Electronically Signed   By: Genevie Ann M.D.   On: 06/15/2022 07:17   VAS [place] LOWER EXTREMITY VENOUS (DVT)  Result Date: 06/14/2022  Lower Venous DVT Study Patient Name:  Wendy Barber  Date of Exam:   06/14/2022 Medical Rec #: [id number]       Accession #:    [id number] Date of Birth: 07-16-1961       Patient Gender: F Patient Age:   62 years Exam Location:  [facility] Procedure:      VAS [place] LOWER EXTREMITY VENOUS (DVT) Referring Phys: Burney Gauze --------------------------------------------------------------------------------  Indications: Pain, and Swelling. Other Indications: Abnormality seen on CT of pelvis. Risk Factors: Hx of DVT & PE (IVC filter in place) Recurrent appendiceal cancer. Anticoagulation: Patient on Xarelto until 2 weeks ago - taken off due to rectal bleeding. Comparison Study: Previous exam ([facility]) on 05/17/21 was negative for DVT &                   positive for SVT in proximal GSV. Performing Technologist: Rogelia Rohrer RVT, RDMS  Examination Guidelines: A complete evaluation includes B-mode imaging, spectral Doppler, color Doppler, and power Doppler as needed of all accessible portions of each vessel. Bilateral testing is considered an  integral part of a complete examination. Limited examinations for reoccurring indications may be performed as noted. The reflux portion of the exam is performed with the patient in reverse Trendelenburg.  +---------+---------------+---------+-----------+----------+-------------------+ RIGHT    CompressibilityPhasicitySpontaneityPropertiesThrombus Aging      +---------+---------------+---------+-----------+----------+-------------------+ CFV      None           No       No                   Acute               +---------+---------------+---------+-----------+----------+-------------------+ SFJ      None                                         Acute               +---------+---------------+---------+-----------+----------+-------------------+ FV Prox  None           No       No                   Acute               +---------+---------------+---------+-----------+----------+-------------------+ FV Mid   None  No       No                   Acute               +---------+---------------+---------+-----------+----------+-------------------+ FV DistalNone           No       No                   Acute               +---------+---------------+---------+-----------+----------+-------------------+ PFV      None           No       No                   Acute               +---------+---------------+---------+-----------+----------+-------------------+ POP      None           No       No                   Acute               +---------+---------------+---------+-----------+----------+-------------------+ PTV                                                   Not well visualized +---------+---------------+---------+-----------+----------+-------------------+ PERO                                                  Not visualized      +---------+---------------+---------+-----------+----------+-------------------+ Gastroc  None           No       No                    Acute               +---------+---------------+---------+-----------+----------+-------------------+ GSV      None           No       No                   Acute               +---------+---------------+---------+-----------+----------+-------------------+ EIV      None           No       No                   Acute               +---------+---------------+---------+-----------+----------+-------------------+   Right Technical Findings: Not visualized segments include peroneal veins.  +---------+---------------+---------+-----------+----------+--------------+ LEFT     CompressibilityPhasicitySpontaneityPropertiesThrombus Aging +---------+---------------+---------+-----------+----------+--------------+ CFV      None           No       No                   Acute          +---------+---------------+---------+-----------+----------+--------------+ SFJ      None                                                        +---------+---------------+---------+-----------+----------+--------------+  FV Prox  None           No       No                   Acute          +---------+---------------+---------+-----------+----------+--------------+ FV Mid   None           No       No                   Acute          +---------+---------------+---------+-----------+----------+--------------+ FV DistalNone           No       No                   Acute          +---------+---------------+---------+-----------+----------+--------------+ PFV      None           No       No                   Acute          +---------+---------------+---------+-----------+----------+--------------+ POP      None           No       No                   Acute          +---------+---------------+---------+-----------+----------+--------------+ PTV      Full                                                         +---------+---------------+---------+-----------+----------+--------------+ PERO     Full                                                        +---------+---------------+---------+-----------+----------+--------------+ Gastroc  None           No       No                   Acute          +---------+---------------+---------+-----------+----------+--------------+ SSV      None           No       No                   Acute          +---------+---------------+---------+-----------+----------+--------------+ EIV      None           No       No                   Acute          +---------+---------------+---------+-----------+----------+--------------+     Summary: BILATERAL: -No evidence of popliteal cyst, bilaterally. -Imaging of bilateral common iliac veins attempted, however due to midline scarring from previous surgeries, poor ultrasound/tissue interface, and bowel gas unable to visualize. A portion of the distal IVC shows evidence on acute DVT. RIGHT: - Findings consistent with acute deep vein thrombosis  involving the right common femoral vein, SF junction, right femoral vein, right proximal profunda vein, right popliteal vein, right gastrocnemius veins, and external iliac vein. - Findings consistent with acute superficial vein thrombosis involving the right great saphenous vein.  LEFT: - Findings consistent with acute deep vein thrombosis involving the left common femoral vein, SF junction, left femoral vein, left proximal profunda vein, left popliteal vein, and left gastrocnemius veins. - Findings consistent with acute superficial vein thrombosis involving the left small saphenous vein.  *See table(s) above for measurements and observations. Electronically signed by Servando Snare MD on 06/14/2022 at 2:11:58 PM.    Final    CT ABDOMEN PELVIS WO CONTRAST  Result Date: 06/13/2022 CLINICAL DATA:  Weakness and near-syncope. Bilateral hip pain. History of appendiceal cancer with  metastasis. Pseudomyxoma peritonei. History of pulmonary emboli and deep venous thrombosis. * Tracking Code: BO * EXAM: CT ABDOMEN AND PELVIS WITHOUT CONTRAST TECHNIQUE: Multidetector CT imaging of the abdomen and pelvis was performed following the standard protocol without IV contrast. RADIATION DOSE REDUCTION: This exam was performed according to the departmental dose-optimization program which includes automated exposure control, adjustment of the mA and/or kV according to patient size and/or use of iterative reconstruction technique. COMPARISON:  03/23/2022 PET. 09/29/2021 contrast-enhanced chest abdomen and pelvic CT. FINDINGS: Lower chest: Mild bibasilar scarring. Normal heart size without pericardial or pleural effusion. Lad coronary artery calcification. Hepatobiliary: Advanced cirrhosis, as evidenced by an irregular hepatic capsule. Limited sensitivity for focal liver lesion due to lack of IV contrast. Cholecystectomy, without biliary ductal dilatation. Pancreas: Moderate pancreatic atrophy with fatty replacement in the head and uncinate process. Spleen: Splenectomy. Adrenals/Urinary Tract: Normal adrenal glands. No renal calculi or hydronephrosis. Decompressed urinary bladder, without bladder calculi. The ureters are somewhat challenging to follow, but no ureteric stones are seen. Stomach/Bowel: Normal stomach, without wall thickening. Surgical sutures at the rectosigmoid junction. Right hemicolectomy. Concurrent right pelvic enterotomy. Bowel is poorly evaluated secondary to lack of oral or IV contrast. Anterior position of bowel loops is relatively similar and suggests adhesions. Vascular/Lymphatic: Aortic atherosclerosis. IVC filter is appropriately positioned, just below the renal veins. Edema is identified about both pelvic and groin venous systems, including on 72 and 73 of series 2. Subtle hypoattenuation within the left external iliac vein including on 66/2. No abdominopelvic adenopathy.  Reproductive: Hysterectomy.  No adnexal mass. Other: No significant free fluid. No free intraperitoneal air. No convincing evidence of peritoneal metastasis. Musculoskeletal: L4-5 trans pedicle screw fixation. Moderate convex left lumbar spine curvature. IMPRESSION: 1. Perivascular edema within both groins and superficial pelvic sidewalls. Suspect bilateral pelvic and femoral venous thrombosis, suboptimally evaluated on this noncontrast exam. IVC filter in place. Ultrasound or dedicated CT venography versus MR venography could confirm. 2. Otherwise, low sensitivity exam secondary to lack of oral or IV contrast. 3. Extensive surgical changes throughout the abdomen, without specific evidence of metastatic disease. 4. Cirrhosis 5. Age advanced coronary artery atherosclerosis. Recommend assessment of coronary risk factors. 6.  Aortic Atherosclerosis (ICD10-I70.0). Electronically Signed   By: Abigail Miyamoto M.D.   On: 06/13/2022 16:38   CT L-SPINE NO CHARGE  Result Date: 06/13/2022 CLINICAL DATA:  Generalized weakness. Bilateral hip pain. History of metastatic disease EXAM: CT Lumbar Spine without contrast TECHNIQUE: Technique: Multiplanar CT images of the lumbar spine were reconstructed from contemporary CT of the Abdomen and Pelvis. RADIATION DOSE REDUCTION: This exam was performed according to the departmental dose-optimization program which includes automated exposure control, adjustment of the mA and/or kV according to  patient size and/or use of iterative reconstruction technique. CONTRAST:  None COMPARISON:  Abdominal CT 09/29/2021 FINDINGS: Segmentation: 5 lumbar type vertebrae based on the available coverage. L5 is incompletely segmented from the sacrum at the left transverse process Alignment: Levoscoliosis. Vertebrae: No evidence of acute fracture or aggressive bone lesion. Paraspinal and other soft tissues: The IVC and left more than right iliac veins appear high-density and expanded with retroperitoneal  edema. Partially covered liver shows lobulation from cirrhosis. Disc levels: T12- L1: Unremarkable. L1-L2: Mild disc narrowing L2-L3: Degenerative facet spurring and ligamentum flavum thickening. Disc space narrowing and bulging. Spinal stenosis is at least moderate. Moderate bilateral foraminal narrowing L3-L4: Disc collapse with endplate and facet spurring. At least moderate spinal stenosis. Biforaminal impingement L4-L5: PLIF with solid arthrodesis. Decompression with patent appearance of the canal. Mild to moderate left foraminal narrowing L5-S1:Mild spurring with incomplete segmentation. No bony impingement. IMPRESSION: 1. Dominant findings in the retroperitoneum where there is evidence of confluent thrombus in the iliac veins and IVC below a filter. 2. No acute finding in the lumbar spine. 3. Lumbar spine degeneration and scoliosis with moderate spinal stenosis at L2-3 and L3-4. Electronically Signed   By: Jorje Guild M.D.   On: 06/13/2022 16:25    Labs:  CBC: Recent Labs    06/13/22 1240 06/14/22 0900 06/15/22 0431 06/16/22 0417  WBC 15.0* 11.6* 11.2* 12.1*  HGB 8.8* 7.3* 7.5* 10.5*  HCT 29.0* 24.0* 24.7* 33.4*  PLT 185 211 297 354    COAGS: Recent Labs    05/31/22 1945 06/13/22 1841  INR 1.1 1.3*  APTT  --  32    BMP: Recent Labs    06/13/22 1240 06/14/22 0359 06/15/22 0431 06/16/22 0417  NA 135 135 140 142  K 3.9 3.4* 3.5 3.3*  CL 103 105 108 109  CO2 19* '22 23 22  '$ GLUCOSE 148* 160* 175* 139*  BUN 42* 40* 33* 28*  CALCIUM 9.1 8.7* 9.0 9.1  CREATININE 2.03* 1.88* 1.34* 1.39*  GFRNONAA 28* 30* 46* 44*    LIVER FUNCTION TESTS: Recent Labs    06/01/22 0520 06/13/22 1240 06/14/22 0359 06/15/22 0431  BILITOT 0.7 <0.1* 0.5 0.4  AST '19 27 19 22  '$ ALT '16 16 14 15  '$ ALKPHOS 140* 154* 119 130*  PROT 7.4 8.3* 6.7 7.4  ALBUMIN 3.4* 3.6 3.0* 3.2*    Assessment and Plan: Pt familiar to IR service from random  liver biopsy in 2021, midline abdominal abscess  drain placement on 01/27/2021 (enterocutaneous fistula) with repositioning and upsizing on 02/02/2021 followed by removal on 03/10/2021.  He has a past medical history significant for osteoarthritis, chronic back pain, pseudomyxoma peritonei static appendiceal cancer, chronic fatigue syndrome, fibromyalgia, diabetes, DVT/PE/IVC filter (2005) placement, anemia.  She was admitted to [facility] ED on 8/21 with generalized weakness and near syncopal episodes at home, bilateral lower extremity edema, bilateral hip pain.  She had originally been taking Xarelto however that was discontinued due to prior GI bleeding.  Latest imaging has revealed CT head with no acute intracranial abnormality ,stable partially calcified left posterior fossa meningioma, no significant carotid artery stenosis. CT abdomen pelvis on 8/21 revealed:   1. Perivascular edema within both groins and superficial pelvic sidewalls. Suspect bilateral pelvic and femoral venous thrombosis, suboptimally evaluated on this noncontrast exam. IVC filter in place. Ultrasound or dedicated CT venography versus MR venography could confirm. 2. Otherwise, low sensitivity exam secondary to lack of oral or IV contrast. 3. Extensive surgical changes throughout  the abdomen, without specific evidence of metastatic disease. 4. Cirrhosis 5. Age advanced coronary artery atherosclerosis. Recommend assessment of coronary risk factors. 6.  Aortic Atherosclerosis  CT venogram of abdomen pelvis performed on 8/23 revealed:   1. Thrombosed inferior vena cava and bilateral common iliac, external iliac, common femoral, and visualized femoral veins. 2. Thrombosed infrarenal IVC filter with small amount of thrombus extending superior to the filter apex. Renal veins are patent. 3. Interval development of mild right hydronephrosis with transition at the ureteropelvic junction. 4. Interval development of moderate distal esophageal wall thickening suspicious for  esophagitis  Bilateral lower extremity venous Doppler on 8/22 revealed:   RIGHT: - Findings consistent with acute deep vein thrombosis involving the right common femoral vein, SF junction, right femoral vein, right proximal profunda vein, right popliteal vein, right gastrocnemius veins, and external iliac vein. - Findings consistent with acute superficial vein thrombosis involving the right great saphenous vein. LEFT: - Findings consistent with acute deep vein thrombosis involving the left common femoral vein, SF junction, left femoral vein, left proximal profunda vein, left popliteal vein, and left gastrocnemius veins. - Findings consistent with acute superficial vein thrombosis involving the left small saphenous vein.  Renal Doppler yesterday revealed:  Renal: Right: Normal size right kidney. Normal right Resisitive Index. No evidence of right renal artery stenosis. RRV flow present. Left: Normal size of left kidney. Normal left Resistive Index. No evidence of left renal artery stenosis. LRV flow present.   She is currently afebrile, on IV heparin, potassium 3.3, creatinine 1.39, WBC 12.1, hemoglobin 10.5, platelets 354k, PT 16.3, INR 1.4, urine culture with E. Coli- on IV rocephin; request now received for consideration of bilateral lower extremity venography with possible thrombectomy, possible IVC filter removal/replacement.  Latest imaging studies and case have been reviewed by Dr. Earleen Newport.  Details/risks of bilateral lower extremity venography with possible thrombectomy, possible IVC filter retrieval/replacement including but not limited to, internal bleeding, infection, worsening renal failure potentially requiring dialysis, worsening post thrombotic syndrome, PE, inability to eradicate clot, inability to retrieve IVC filter discussed with patient and family in detail by Dr. Earleen Newport.  Both patient and family have chosen to proceed with the above-noted procedure and consent has been  signed.  Patient will require transfer to [facility] for above procedure. TRH  aware.   Electronically Signed: D. Rowe Robert, PA-C 06/16/2022, 1:38 PM   I spent a total of 40 minutes at the the patient's bedside AND on the patient's hospital floor or unit, greater than 50% of which was counseling/coordinating care for bilateral lower extremity venography with possible thrombectomy, possible IVC filter retrieval/replacement   Patient ID: Wendy Barber, female   DOB: [birth year], 61 y.o.   MRN: [id number]

## 2022-06-16 NOTE — Assessment & Plan Note (Addendum)
Not on statin, aspirin - Hold BB (uncertain indication for BB)

## 2022-06-16 NOTE — Progress Notes (Signed)
Progress Note   Patient: Wendy Barber HWE:[id number] DOB: 08/25/1961 DOA: 06/13/2022     2 DOS: the patient was seen and examined on 06/16/2022 at 2:25PM      Brief hospital course: Mrs. Sylvan is a 61 y.o. F with appendiceal CA and pseudomyxoma peritonei, DM, hx DVT and bilateral PE with IVC filter, hx iron deficiency anemia, anxiety, meningioma, autoimmune hepatitis and chronic fatigue syndrome/fibromyalgia who presented with leg swelling, generalized weakness and multiple near syncopal episodes at home.    Subsequent imaging showed bilateral DVT extending up to the IVC.  Started on heparin and admitted.  IR and Oncology consulted.     Assessment and Plan: * IVC thrombosis (HCC)     DVT, bilateral lower limbs (HCC)     Bilateral iliac artery thrombosis (HCC) - Continue heparin - Consult IR, appreciate recommendations, plan for transfer to [facility] for thrombectomy when bed available         Heartburn Abdomen x-ray from this morning shows possible ileus, CT abdomen pelvis this afternoon shows no findings of ileus or bowel obstruction, bowel ischemia, perforation.  Does show esophagitis, and to me her symptomology is primarily of heartburn.  EKG unchanged from admission, troponins negative, anginal equivalent, ACS ruled out. - Start sucralfate - Change PPI to IV - Viscous lidocaine if the above is not helpful - IV fluids     AKI (acute kidney injury) ([place]) Creatinine >2 on admission, baseline is 0.8 Still 1.3 today - Push oral fluids - Avoid nephrotoxins  UTI (urinary tract infection) - Continue Rocephin, day 3 of 5  Aortic atherosclerosis (HCC) Not on statin, aspirin - Hold BB  CAD (coronary artery disease) Not on satin, aspirin - Hold propranolol  Obesity, morbid, BMI 40.0-49.9 (HCC) BMI 40.1  Hypotension Resolved  Syncope Due to hypotension and IVC clot  Anxiety - Continue bupropion, BuSpar, citalopram  Iron deficiency anemia due to chronic  blood loss No clinical bleeding.  Transfused 1 unit 8/23.  Iron stores low still this admission.  Given ferrlecit during admission 1 month ago. - IV iron - Trend CBC  Hypokalemia - Supplement K again  DM2 (diabetes mellitus, type 2) (HCC) Glucose high normal off insulin here, A1c 6.5 - Monitor glucose - No need for sliding scale corrections at this time.    HTN (hypertension) Blood pressure normal - Hold propranolol           Subjective: Patient's heartburn is improved with sucralfate.  She is hungry.  She has had no confusion, chest pain, dyspnea.  No fever.     Physical Exam: Vitals:   06/15/22 2137 06/16/22 0359 06/16/22 0500 06/16/22 1149  BP: (!) 151/81 138/82  125/75  Pulse: 86 78  72  Resp: '20 19  16  '$ Temp: 98.8 F (37.1 C) 98.4 F (36.9 C)  98.3 F (36.8 C)  TempSrc: Oral Oral  Oral  SpO2: 96% 98%  98%  Weight:   114.3 kg   Height:       Obese adult female, lying in bed, appears tired RRR, no murmurs, no peripheral edema Respiratory rate normal, lungs clear without rales or wheezes Abdomen soft no tenderness palpation or guarding, no ascites or distention Attention normal, affect normal, judgment and insight appear normal  Data Reviewed: Discussed with interventional radiology team Renal ultrasound duplex shows normal venous flow Sodium normal Creatinine 1.4, no change Hemoglobin 10,, up from 7.5 yesterday Potassium level White blood cell count 12, no change Troponins negative    Family  Communication: Husband at the bedside    Disposition: Status is: Inpatient The patient presented with bilateral DVT, extending up to the IVC.  The case has been discussed with radiology, will plan thrombectomy hopefully we can.        Author: Edwin Dada, MD 06/16/2022 7:43 PM  For on call review [email or web address].

## 2022-06-17 ENCOUNTER — Encounter (HOSPITAL_COMMUNITY): Payer: Self-pay | Admitting: Internal Medicine

## 2022-06-17 DIAGNOSIS — I82220 Acute embolism and thrombosis of inferior vena cava: Secondary | ICD-10-CM | POA: Diagnosis not present

## 2022-06-17 DIAGNOSIS — I700 Atherosclerosis of aorta: Secondary | ICD-10-CM | POA: Diagnosis not present

## 2022-06-17 DIAGNOSIS — N179 Acute kidney failure, unspecified: Secondary | ICD-10-CM | POA: Diagnosis not present

## 2022-06-17 DIAGNOSIS — F419 Anxiety disorder, unspecified: Secondary | ICD-10-CM | POA: Diagnosis not present

## 2022-06-17 LAB — CBC WITH DIFFERENTIAL/PLATELET
Abs Immature Granulocytes: 0.22 10*3/uL — ABNORMAL HIGH (ref 0.00–0.07)
Basophils Absolute: 0.1 10*3/uL (ref 0.0–0.1)
Basophils Relative: 1 %
Eosinophils Absolute: 0.5 10*3/uL (ref 0.0–0.5)
Eosinophils Relative: 5 %
HCT: 33.3 % — ABNORMAL LOW (ref 36.0–46.0)
Hemoglobin: 10.1 g/dL — ABNORMAL LOW (ref 12.0–15.0)
Immature Granulocytes: 2 %
Lymphocytes Relative: 24 %
Lymphs Abs: 2.6 10*3/uL (ref 0.7–4.0)
MCH: 28.5 pg (ref 26.0–34.0)
MCHC: 30.3 g/dL (ref 30.0–36.0)
MCV: 94.1 fL (ref 80.0–100.0)
Monocytes Absolute: 1.3 10*3/uL — ABNORMAL HIGH (ref 0.1–1.0)
Monocytes Relative: 12 %
Neutro Abs: 6 10*3/uL (ref 1.7–7.7)
Neutrophils Relative %: 56 %
Platelets: 409 10*3/uL — ABNORMAL HIGH (ref 150–400)
RBC: 3.54 MIL/uL — ABNORMAL LOW (ref 3.87–5.11)
RDW: 18.1 % — ABNORMAL HIGH (ref 11.5–15.5)
WBC: 10.8 10*3/uL — ABNORMAL HIGH (ref 4.0–10.5)
nRBC: 0.7 % — ABNORMAL HIGH (ref 0.0–0.2)

## 2022-06-17 LAB — COMPREHENSIVE METABOLIC PANEL
ALT: 17 U/L (ref 0–44)
AST: 26 U/L (ref 15–41)
Albumin: 3.2 g/dL — ABNORMAL LOW (ref 3.5–5.0)
Alkaline Phosphatase: 138 U/L — ABNORMAL HIGH (ref 38–126)
Anion gap: 6 (ref 5–15)
BUN: 20 mg/dL (ref 6–20)
CO2: 25 mmol/L (ref 22–32)
Calcium: 9.2 mg/dL (ref 8.9–10.3)
Chloride: 112 mmol/L — ABNORMAL HIGH (ref 98–111)
Creatinine, Ser: 1.08 mg/dL — ABNORMAL HIGH (ref 0.44–1.00)
GFR, Estimated: 59 mL/min — ABNORMAL LOW (ref >60)
Glucose, Bld: 110 mg/dL — ABNORMAL HIGH (ref 70–99)
Potassium: 3.8 mmol/L (ref 3.5–5.1)
Sodium: 143 mmol/L (ref 135–145)
Total Bilirubin: 0.6 mg/dL (ref 0.3–1.2)
Total Protein: 7.1 g/dL (ref 6.5–8.1)

## 2022-06-17 LAB — GLUCOSE, CAPILLARY
Glucose-Capillary: 97 mg/dL (ref 70–99)
Glucose-Capillary: 100 mg/dL — ABNORMAL HIGH (ref 70–99)
Glucose-Capillary: 84 mg/dL (ref 70–99)
Glucose-Capillary: 96 mg/dL (ref 70–99)
Glucose-Capillary: 92 mg/dL (ref 70–99)

## 2022-06-17 LAB — HEPARIN LEVEL (UNFRACTIONATED)
Heparin Unfractionated: 0.56 IU/mL (ref 0.30–0.70)
Heparin Unfractionated: 0.46 IU/mL (ref 0.30–0.70)
Heparin Unfractionated: 0.52 IU/mL (ref 0.30–0.70)

## 2022-06-17 MED ORDER — PANTOPRAZOLE SODIUM 40 MG PO TBEC
40.0000 mg | DELAYED_RELEASE_TABLET | Freq: Two times a day (BID) | ORAL | Status: DC
  Administered 2022-06-17 – 2022-06-26 (×17): 40 mg via ORAL
  Filled 2022-06-17 (×17): qty 1

## 2022-06-17 MED ORDER — HEPARIN (PORCINE) 25000 UT/250ML-% IV SOLN
1600.0000 [IU]/h | INTRAVENOUS | Status: DC
Start: 2022-06-17 — End: 2022-06-21
  Administered 2022-06-19 – 2022-06-21 (×4): 1600 [IU]/h via INTRAVENOUS
  Filled 2022-06-17 (×6): qty 250

## 2022-06-17 MED ORDER — SODIUM CHLORIDE 0.9 % IV SOLN: INTRAVENOUS | Status: DC | PRN

## 2022-06-17 NOTE — Plan of Care (Signed)
  Problem: Clinical Measurements: Goal: Diagnostic test results will improve Outcome: Progressing Goal: Respiratory complications will improve Outcome: Progressing   Problem: Coping: Goal: Level of anxiety will decrease Outcome: Progressing   Problem: Pain Managment: Goal: General experience of comfort will improve Outcome: Progressing   Problem: Safety: Goal: Ability to remain free from injury will improve Outcome: Progressing

## 2022-06-17 NOTE — Progress Notes (Signed)
Patient transferred stable, with heparin drip infusing at 1450 units/hr via [facility] to [facility], all belongings with patient.

## 2022-06-17 NOTE — Progress Notes (Signed)
ANTICOAGULATION CONSULT NOTE - Follow Up Consult  Pharmacy Consult for Heparin Indication:  VTE  Allergies  Allergen Reactions   Penicillins Shortness Of Breath    Other reaction(s): Irregular Heart Rate, Other (See Comments) Rapid heartrate    Alprazolam Hives and Other (See Comments)    Hard to arouse, unresponsiveness   Ativan [Lorazepam] Other (See Comments)    Note: tolerates midazolam fine Face & Throat Swelling.   Corticosteroids Other (See Comments)    Other reaction(s): Other (see comments) Psychotic behaviour     Erythromycin     Other reaction(s): Other (See Comments) Severe stomach pain    Prednisone Other (See Comments)    Anxiety & Nervous Breakdown.   Savella  [Milnacipran]    Prednisolone Anxiety    Patient Measurements: Height: '5\' 6"'$  (167.6 cm) Weight: 114.7 kg (252 lb 13.9 oz) IBW/kg (Calculated) : 59.3 Heparin Dosing Weight: 84.7 kg  Vital Signs: Temp: 97.9 F (36.6 C) (08/25 0427) Temp Source: Oral (08/25 0427) BP: 146/78 (08/25 0427) Pulse Rate: 67 (08/25 0427)  Labs: Recent Labs    06/15/22 0431 06/15/22 1226 06/16/22 0417 06/17/22 0411  HGB 7.5*  --  10.5* 10.1*  HCT 24.7*  --  33.4* 33.3*  PLT 297  --  354 409*  HEPARINUNFRC 0.47  --  0.47 0.56  CREATININE 1.34*  --  1.39*  --   TROPONINIHS  --  3  --   --      Estimated Creatinine Clearance: 56.1 mL/min (A) (by C-G formula based on SCr of 1.39 mg/dL (H)).   Assessment: AC/Heme: H/o DVT, BL PE and superior mesenteric vein thrombus prev on Xarelto but d/c'd in July for GIB (pt accidentally took extra dose)  - 8/20 CT A/P:  suspicion for new bilateral pelvic and femoral vein thrombosis - 8/22: A portion of the distal IVC shows evidence of acute DVT and BL DVT's of the LE. - Has IVC filter in place from ~2yr ago (still in place per scan) - Hgb 7.5 (transfuse)>10.5, Plts 354 with known IDA. Hep level 0.47 again today.  06/17/2022 HL 0.56 above goal of 0.3-0.5 Hgb 10.1 but  stable, plts 409 No bleeding noted per RN  Goal of Therapy:  Heparin level 0.3-0.5 units/ml Monitor platelets by anticoagulation protocol: Yes   Plan:  - decrease heparin drip to 1550 units/hr - heparin level in 6 hours - Low goal (0.3-0.5) given recent GIB (July) - Daily HL and CBC  EDolly RiasRPh 06/17/2022, 5:24 AM

## 2022-06-17 NOTE — Progress Notes (Signed)
Progress Note   Patient: Wendy Barber ION:[id number] DOB: 1961/03/13 DOA: 06/13/2022     3 DOS: the patient was seen and examined on 06/17/2022 at 11:10AM      Brief hospital course: Wendy Barber is a 61 y.o. F with appendiceal CA and pseudomyxoma peritonei, DM, hx DVT and bilateral PE with IVC filter, hx iron deficiency anemia, anxiety, meningioma, autoimmune hepatitis and chronic fatigue syndrome/fibromyalgia/chronic pain not on opiates who presented with leg swelling, generalized weakness and multiple near syncopal episodes at home.    Subsequent imaging showed bilateral DVT extending up to the IVC.  Started on heparin and admitted.  IR and Oncology consulted.        Assessment and Plan: * IVC thrombosis (HCC) DVT, bilateral lower limbs ([place]) Bilateral iliac artery thrombosis ([place]) Clotted IVC filter and DVT into both legs.  On heparin.  IR consulted for thrombectomy.  - Continue heparin - Consult IR, appreciate assistance - Transfer to [facility], plan to have IVC thrombectomy either over the weekend or on Monday         AKI (acute kidney injury) ([place]) Creatinine >2 on admission, baseline is 0.8 Improved with fluids.  Near 1 today with oral fluids yesterday - Push oral fluids - Avoid nephrotoxins     UTI (urinary tract infection) - Continue Rocephin, day 4 of 5   Heartburn Actually this was a debilitating symptom a few days ago after admission.    Had CT abdomen and pelvis that ruled out ileus or bowel obstruction, bowel ischemia, perforation.  It did show esophagitis, consistent with her symptoms of heartburn and discomfort with swallowing.  ACS ruled out, sucralfate and PPI started and symptoms improved.     - Continue sucralfate - Continue PPI BID, change back to once daily at discharge     Aortic atherosclerosis (HCC) Not on statin, aspirin - Hold BB (uncertain indication for BB)  CAD (coronary artery disease) Not on statin, aspirin - Hold  propranolol  Obesity, morbid, BMI 40.0-49.9 (HCC) BMI 40.1  Hypotension Resolved  Syncope Due to hypotension and IVC clot  Anxiety - Continue bupropion, BuSpar, citalopram  Iron deficiency anemia due to chronic blood loss No clinical bleeding.  Transfused 1 unit 8/23.  Iron stores low still this admission.  Given ferrlecit during admission 1 month ago.  Given Feraheme this admission.   Hgb stable at 10 today - Consult [place], appreciate Dr. Antonieta Pert expertise - Trend CBC  Hypokalemia Resolved  DM2 (diabetes mellitus, type 2) (HCC) Glucose normal off insulin here, A1c 6.5, diet controlled at home - Monitor glucose - No need for sliding scale corrections at this time.    HTN (hypertension) Blood pressure normal - Hold propranolol           Subjective: Heartburn is better, she is eager to sit up, she has no vomiting, confusion, fever, chest pain.  Her legs still feel very swollen and "tight".     Physical Exam: Vitals:   06/16/22 1149 06/16/22 2000 06/17/22 0427 06/17/22 1249  BP: 125/75 125/78 (!) 146/78 (!) 156/85  Pulse: 72 78 67 72  Resp: 16   16  Temp: 98.3 F (36.8 C) 98.5 F (36.9 C) 97.9 F (36.6 C) 98.3 F (36.8 C)  TempSrc: Oral Oral Oral Oral  SpO2: 98% 95% 96% 97%  Weight:   114.7 kg   Height:       Adult female, lying in bed, no acute distress RRR, no murmurs, lower extremity edema noted, no pitting Respiratory  rate normal, lungs clear without rales or wheezes, overall diminished Abdomen diffuse tenderness, no focal tenderness, voluntary guarding noted, no rigidity, no rebound, no distention Attention normal, affect appropriate, judgment and insight appear normal   Data Reviewed: Discussed with interventional radiology Basic metabolic panel normal Hemoglobin 10, no change    Family Communication: Daughter at the bedside    Disposition: Status is: Inpatient The patient presented with syncope, found to have IVC clot because  of her IVC filter.  Started on heparin, and there are plans for thrombectomy when the procedure can be arranged.  In the meantime she is being treated symptomatically for esophagitis.  We will transfer to [facility] today, she will have the procedure over the weekend on Monday, after the procedure she will need physical therapy and then likely placement.         Author: Edwin Dada, MD 06/17/2022 3:50 PM  For on call review [email or web address]

## 2022-06-17 NOTE — Progress Notes (Signed)
ANTICOAGULATION CONSULT NOTE - Follow Up Consult  Pharmacy Consult for Heparin Indication:  VTE  Allergies  Allergen Reactions   Penicillins Shortness Of Breath    Other reaction(s): Irregular Heart Rate, Other (See Comments) Rapid heartrate    Alprazolam Hives and Other (See Comments)    Hard to arouse, unresponsiveness   Ativan [Lorazepam] Other (See Comments)    Note: tolerates midazolam fine Face & Throat Swelling.   Corticosteroids Other (See Comments)    Other reaction(s): Other (see comments) Psychotic behaviour     Erythromycin     Other reaction(s): Other (See Comments) Severe stomach pain    Prednisone Other (See Comments)    Anxiety & Nervous Breakdown.   Savella  [Milnacipran]    Prednisolone Anxiety    Patient Measurements: Height: '5\' 6"'$  (167.6 cm) Weight: 114.7 kg (252 lb 13.9 oz) IBW/kg (Calculated) : 59.3 Heparin Dosing Weight: 84.7 kg  Vital Signs: Temp: 98.3 F (36.8 C) (08/25 1249) Temp Source: Oral (08/25 1249) BP: 156/85 (08/25 1249) Pulse Rate: 72 (08/25 1249)  Labs: Recent Labs    06/15/22 0431 06/15/22 1226 06/16/22 0417 06/17/22 0411 06/17/22 1203  HGB 7.5*  --  10.5* 10.1*  --   HCT 24.7*  --  33.4* 33.3*  --   PLT 297  --  354 409*  --   HEPARINUNFRC 0.47  --  0.47 0.56 0.46  CREATININE 1.34*  --  1.39* 1.08*  --   TROPONINIHS  --  3  --   --   --      Estimated Creatinine Clearance: 72.2 mL/min (A) (by C-G formula based on SCr of 1.08 mg/dL (H)).   Assessment: 61 yo female with hx DVT, BL PE and superior mesenteric vein thrombus prev on Xarelto but d/c'd in July for GIB (pt accidentally took extra dose)  - 8/20 CT A/P:  suspicion for new bilateral pelvic and femoral vein thrombosis - 8/22: A portion of the distal IVC shows evidence of acute DVT and BL DVT's of the LE. - Has IVC filter in place from ~23yr ago (still in place per scan)  06/17/2022 Heparin level 0.46, therapeutic goal (0.3-0.5) Hgb 10.1, stable after  transfusion 8/23, feraheme 8/24 No bleeding documented  Goal of Therapy:  Heparin level 0.3-0.5 units/ml Monitor platelets by anticoagulation protocol: Yes   Plan:  - Continue heparin drip at 1550 units/hr - Heparin level in 6 hours to verify therapeutic - Low goal (0.3-0.5) given recent GIB (July) - Daily [place] and [place] PharmD, BCPS Pharmacy: [phone]/25/2023, 1:05 PM

## 2022-06-17 NOTE — Progress Notes (Addendum)
ANTICOAGULATION CONSULT NOTE - Follow Up Consult  Pharmacy Consult for Heparin Indication:  VTE   Patient Measurements: Height: '5\' 6"'$  (167.6 cm) Weight: 114.7 kg (252 lb 13.9 oz) IBW/kg (Calculated) : 59.3 Heparin Dosing Weight: 84.7 kg   Brief Note: See full note earlier today  61 yo female with hx DVT, BL PE and superior mesenteric vein thrombus previously on Xarelto but d/c'd in July for GIB (pt accidentally took extra dose). - 8/20 CT A/P:  suspicion for new bilateral pelvic and femoral vein thrombosis - 8/22: A portion of the distal IVC shows evidence of acute DVT and BL DVT's of the LE.  Today, 06/17/2022:  Heparin level 0.52, slightly above low goal (0.3-0.5) Heparin infusing at 1550 units/hr.   No bleeding or complications reported by RN.   Goal of Therapy:  Heparin level 0.3-0.5 units/ml Monitor platelets by anticoagulation protocol: Yes   Plan:  Decrease to heparin IV infusion at 1450 units/hr Heparin level 6 hours after rate change Daily heparin level and CBC Follow up on plans for transfer to [facility] for bilat LE venography with poss thrombectomy/IVC filter retrieval/replacement   Gretta Arab PharmD, BCPS Clinical Pharmacist [facility] main pharmacy [phone] 06/17/2022 7:02 PM

## 2022-06-17 NOTE — Progress Notes (Signed)
It sounds like it would be a busy day for Wendy Barber.  She is hopefully going over to [facility] for thrombectomy and opening of the IVC filter.  Hopefully this will improve her blood flow.  She does feel bloated in the abdomen.  She may have a little bit of engorgement of the intestines because of possible thromboembolic disease.  She is on heparin right now.  If she has her thrombectomy, then I would probably get her on Lovenox as an outpatient.  She does have E. coli in the urine.  She had this back in November.  I suspect she will have the same sensitivities.  She has normal renal function now.  I think that Interventional Radiology was worried about her renal function.  She does want to have a Port-A-Cath in.  I will see if that can be put in today also.  If not, we can have this done later.  She absolutely has very little if any IV access.  There is no bleeding.  Her BUN is 20 creatinine 1.08.  Her GFR is 59 cc/min.  Her white cell count is 10.8.  Hemoglobin 10.1.  Platelet count 409,000.  I think that she did get a dose of IV iron yesterday.  She has had no fever.  Her temperature is 97.9.  Pulse 67.  Blood pressure 146/78.  Her lungs sound clear bilaterally.  She has good air movement bilaterally.  Cardiac exam regular rate and rhythm.  She has no murmurs.  Abdomen is obese but soft.  Bowel sounds are present.  She has no guarding or rebound tenderness.  Extremities show some swelling in the legs.  As always, IR does a wonderful job.  I know they will do their best to try to help open up that IVC filter and remove the thrombi.  Hopefully this will help improve her blood flow.  Again she wants to have a Port-A-Cath.  I totally agree with this.  Maybe, IR will be able to do this today.  If not, then we will just have a done another time.  I know she has had incredible care from the wonderful and compassion staff over on [address].  Hopefully, she will have the procedure today and then  be able to come back to [name], [place]  Psalm 46:1

## 2022-06-17 NOTE — Progress Notes (Signed)
Patient ID: Wendy Barber, female   DOB: February 04, 1961, 61 y.o.   MRN: [id number] Request received for port a cath placement in pt. See consult note/exam from yesterday for additional details of med hx. Pt awaiting transfer to [facility] for bilat LE venography with poss thrombectomy/IVC filter retrieval/replacement. Risks and benefits of image guided port-a-catheter placement was discussed with the patient including, but not limited to bleeding, infection, pneumothorax, or fibrin sheath development and need for additional procedures.  All of the patient's questions were answered, patient is agreeable to proceed. Consent signed and in chart. She does have hx UTI and is currently on rocephin ;would be best to complete this course prior to placing port; tent plans are to proceed with port placement sometime next week while at [facility].

## 2022-06-18 DIAGNOSIS — F419 Anxiety disorder, unspecified: Secondary | ICD-10-CM | POA: Diagnosis not present

## 2022-06-18 DIAGNOSIS — N179 Acute kidney failure, unspecified: Secondary | ICD-10-CM | POA: Diagnosis not present

## 2022-06-18 DIAGNOSIS — N3000 Acute cystitis without hematuria: Secondary | ICD-10-CM

## 2022-06-18 DIAGNOSIS — I824Y3 Acute embolism and thrombosis of unspecified deep veins of proximal lower extremity, bilateral: Secondary | ICD-10-CM

## 2022-06-18 DIAGNOSIS — K2970 Gastritis, unspecified, without bleeding: Secondary | ICD-10-CM | POA: Diagnosis present

## 2022-06-18 DIAGNOSIS — F32A Depression, unspecified: Secondary | ICD-10-CM | POA: Diagnosis present

## 2022-06-18 DIAGNOSIS — K2900 Acute gastritis without bleeding: Secondary | ICD-10-CM

## 2022-06-18 LAB — CBC
HCT: 31.2 % — ABNORMAL LOW (ref 36.0–46.0)
Hemoglobin: 9.9 g/dL — ABNORMAL LOW (ref 12.0–15.0)
MCH: 29.3 pg (ref 26.0–34.0)
MCHC: 31.7 g/dL (ref 30.0–36.0)
MCV: 92.3 fL (ref 80.0–100.0)
Platelets: 395 10*3/uL (ref 150–400)
RBC: 3.38 MIL/uL — ABNORMAL LOW (ref 3.87–5.11)
RDW: 18.6 % — ABNORMAL HIGH (ref 11.5–15.5)
WBC: 11.9 10*3/uL — ABNORMAL HIGH (ref 4.0–10.5)
nRBC: 1.6 % — ABNORMAL HIGH (ref 0.0–0.2)

## 2022-06-18 LAB — GLUCOSE, CAPILLARY: Glucose-Capillary: 87 mg/dL (ref 70–99)

## 2022-06-18 LAB — HEPARIN LEVEL (UNFRACTIONATED)
Heparin Unfractionated: 0.29 IU/mL — ABNORMAL LOW (ref 0.30–0.70)
Heparin Unfractionated: 0.25 IU/mL — ABNORMAL LOW (ref 0.30–0.70)

## 2022-06-18 MED ORDER — OXYCODONE HCL 5 MG PO TABS
10.0000 mg | ORAL_TABLET | ORAL | Status: DC | PRN
  Administered 2022-06-18 – 2022-06-21 (×5): 10 mg via ORAL
  Filled 2022-06-18 (×6): qty 2

## 2022-06-18 NOTE — Progress Notes (Signed)
Progress Note   Patient: Wendy Barber QMG:[id number] DOB: 1960/11/28 DOA: 06/13/2022     4 DOS: the patient was seen and examined on 06/18/2022   Brief hospital course: Mrs. Turnipseed was admitted to the hospital with the working diagnosis of bilateral lower extremity extensive DVT.   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 On her initial physical examination her blood pressure was 69/40, HR 88, 02 saturation 100%, lungs with no wheezing or rhonchi, heart with S1 and S2 present and rhythmic, abdomen tender to palpation in the epigastric area, no lower extremity edema.   Na 135, K 3,9, cl 103, bicarbonate 19 glucose 148 bun 42 cr 2,0 Wbc 15, hgb 8,8 plt 185  Urine analysis with SG 1,015, > 50 wbc, 11-20 rbc. 30 protein.   Abdominal radiograph with increased bowel gas suggesting ileus.   CT abdomen and pelvis with bilateral pelvic and femoral venous thrombosis, IVC filter in place. Positive cirrhosis.   Subsequent imaging showed bilateral DVT extending up to the IVC.  Started on heparin and admitted.  IR and Oncology consulted.  EKG 74 bpm, left axis deviation, normal intervals, sinus rhythm with ST depression and T wave inversions in V3 to V6.   CT venogram with thrombosed inferior venal cava and bilateral common iliac, external iliac, common femoral and visualized femoral veins.  Thrombosed infrarenal IVC filter with small amount of thrombus extending superior to the filter apex. Renal veins patent.  Mild right hydronephrosis.   Renal arterial US with no evidence of renal artery stenosis.   Patient was placed on heparin drip for anticoagulation and IR was consulted.  08/26 transferred  to [facility] for thrombectomy.   Assessment and Plan: * DVT, bilateral lower limbs ([place]) Extensive deep vein thrombosis, bilateral lower extremities and inferior vena cava. Patient has a IVC filter in place.   Plan to continue anticoagulation with IV heparin Scheduled for thrombectomy potentially on Monday. Continue pain control with hydromorphone and oxycodone.  As needed acetaminophen Because persistent pain will increase oxycodone to 10 mg as needed.  Continue bowel regime, ok for patient to get out of bed to chair.   UTI (urinary tract infection) Antibiotic therapy with ceftriaxone to complete 5 days.   AKI (acute kidney injury) ([place]) Renal function with serum cr at 1,0 with K at 3,8 and serum bicarbonate at 25. Continue close follow up on renal function and electrolytes.   CAD (coronary artery disease) Continue with antiplatelet therapy.   DM2 (diabetes mellitus, type 2) (HCC) Glucose has been well controlled, holding on insulin therapy.     HTN (hypertension) Continue blood pressure monitoring, holding antihypertensive medications due to risk of hypotension.   Iron deficiency anemia due to chronic blood loss Transfused 1 unit 8/23.  SP IV iron.   Anxiety - Continue bupropion, BuSpar, citalopram  Gastritis Continue antiacid therapy with pantoprazole, famotidine and sucralfate. As needed antiemetics and continue with soft diet as tolerated.   Class 3 obesity (HCC) BMI 40.1  Depression Continue with bupropion, citalopram and buspirone.         Subjective: Patient continue to have abdominal pain, improved with analgesics for short period of time, no vomiting but occasional nausea. No dyspnea or chest pain  Physical Exam: Vitals:   06/17/22  2244 06/18/22 0006 06/18/22 0449 06/18/22 0900  BP: 125/84 119/72 117/73 123/75  Pulse: 83 81 79   Resp: '17 18 18 19  '$ Temp: 98.9 F (37.2 C) 98.3 F (36.8 C) 98.4 F (36.9 C) 98.4 F (36.9 C)  TempSrc: Oral Oral Oral Oral   SpO2: 99% 96% 96%   Weight:   114.6 kg   Height:       Neurology awake and alert ENT with no pallor Cardiovascular with S1 and S2 present and rhythmic with no gallops or rubs Respiratory with no rales or wheezing Abdomen with no distention No lower extremity edema  Data Reviewed:    Family Communication: no family at the bedside    Disposition: Status is: Inpatient Remains inpatient appropriate because: IV heparin pending IR thrombectomy   Planned Discharge Destination: Home      Author: Tawni Millers, MD 06/18/2022 2:30 PM  For on call review [email or web address].

## 2022-06-18 NOTE — Progress Notes (Signed)
ANTICOAGULATION CONSULT NOTE - Follow Up Consult  Pharmacy Consult for Heparin Indication:  VTE  Allergies  Allergen Reactions   Penicillins Shortness Of Breath    Other reaction(s): Irregular Heart Rate, Other (See Comments) Rapid heartrate    Alprazolam Hives and Other (See Comments)    Hard to arouse, unresponsiveness   Ativan [Lorazepam] Other (See Comments)    Note: tolerates midazolam fine Face & Throat Swelling.   Corticosteroids Other (See Comments)    Other reaction(s): Other (see comments) Psychotic behaviour     Erythromycin     Other reaction(s): Other (See Comments) Severe stomach pain    Prednisone Other (See Comments)    Anxiety & Nervous Breakdown.   Savella  [Milnacipran]    Prednisolone Anxiety    Patient Measurements: Height: '5\' 6"'$  (167.6 cm) Weight: 114.7 kg (252 lb 13.9 oz) IBW/kg (Calculated) : 59.3 Heparin Dosing Weight: 84.7 kg  Vital Signs: Temp: 98.4 F (36.9 C) (08/26 0449) Temp Source: Oral (08/26 0449) BP: 117/73 (08/26 0449) Pulse Rate: 79 (08/26 0449)  Labs: Recent Labs    06/15/22 1226 06/16/22 0417 06/16/22 0417 06/17/22 0411 06/17/22 1203 06/17/22 1827 06/18/22 0439  HGB  --  10.5*   < > 10.1*  --   --  9.9*  HCT  --  33.4*  --  33.3*  --   --  31.2*  PLT  --  354  --  409*  --   --  395  HEPARINUNFRC  --  0.47   < > 0.56 0.46 0.52 0.29*  CREATININE  --  1.39*  --  1.08*  --   --   --   TROPONINIHS 3  --   --   --   --   --   --    < > = values in this interval not displayed.     Estimated Creatinine Clearance: 72.2 mL/min (A) (by C-G formula based on SCr of 1.08 mg/dL (H)).   Assessment: 61 yo female with hx DVT, BL PE and superior mesenteric vein thrombus prev on Xarelto but d/c'd in July for GIB (pt accidentally took extra dose)  - 8/20 CT A/P:  suspicion for new bilateral pelvic and femoral vein thrombosis - 8/22: A portion of the distal IVC shows evidence of acute DVT and BL DVT's of the LE. - Has IVC filter  in place from ~18yr ago (still in place per scan)  8/26 AM update: Heparin level just below goal  Goal of Therapy:  Heparin level 0.3-0.5 units/ml Monitor platelets by anticoagulation protocol: Yes   Plan:  Inc heparin to 1500 units/hr 1300 heparin level  JNarda Bonds PharmD, BCPS Clinical Pharmacist Phone: [phone]

## 2022-06-18 NOTE — Assessment & Plan Note (Addendum)
Patient maintained on antiacid therapy with pantoprazole, famotidine and sucralfate. As needed antiemetics and was tolerating soft diet by day of discharge.   -Outpatient follow-up with PCP.

## 2022-06-18 NOTE — Assessment & Plan Note (Addendum)
Patient maintained on home regimen bupropion, citalopram and buspirone.

## 2022-06-18 NOTE — Progress Notes (Signed)
Referring Physician(s): Myrene Buddy, MD  Supervising Physician: Daryll Brod  Patient Status:  [facility] - In-pt  Chief Complaint: "My legs are swollen"  Subjective: Patient has transferred to [facility] She reports stability in her leg swelling.  She states she has been placed on bed rest and has not walked or put weight on her legs since admission.  They do continue to feel very tight and swollen.    Allergies: Penicillins, Alprazolam, Ativan [lorazepam], Corticosteroids, Erythromycin, Prednisone, Savella  [milnacipran], and Prednisolone  Medications: Prior to Admission medications   Medication Sig Start Date End Date Taking? Authorizing Provider  alum & mag hydroxide-simeth (MAALOX/MYLANTA) 200-200-20 MG/5ML suspension Take 15 mLs by mouth every 6 (six) hours as needed for indigestion or heartburn. 05/08/22  Yes Aline August, MD  buPROPion (WELLBUTRIN XL) 150 MG 24 hr tablet Take 1 tablet (150 mg total) by mouth daily. 04/27/22  Yes White, Aaron Edelman A, NP  busPIRone (BUSPAR) 15 MG tablet Take 1 tablet (15 mg total) by mouth 3 (three) times daily. 04/05/22  Yes Lesle Chris A, NP  citalopram (CELEXA) 40 MG tablet Take 1 tablet (40 mg total) by mouth daily. 04/27/22  Yes Elwanda Brooklyn, NP  diazepam (VALIUM) 5 MG tablet Take 1 tablet 30-40 minutes prior to MRI 06/13/22  Yes Jaffe, Adam R, DO  dicyclomine (BENTYL) 20 MG tablet Take 1 tablet (20 mg total) by mouth in the morning, at noon, in the evening, and at bedtime. 05/08/22  Yes Aline August, MD  diphenhydrAMINE (SOMINEX) 25 MG tablet Take 25 mg by mouth daily as needed for itching.   Yes [provider]  diphenoxylate-atropine (LOMOTIL) 2.5-0.025 MG tablet TAKE 2 TABLETS IN THE MORNING, AT NOON, IN THE EVENING AND AT BEDTIME Patient taking differently: Take 2 tablets by mouth 4 (four) times daily. 04/22/22  Yes Ennever, Rudell Cobb, MD  eszopiclone (LUNESTA) 2 MG TABS tablet TAKE 1 TABLET(2 MG) BY MOUTH AT BEDTIME AS NEEDED FOR  SLEEP Patient taking differently: Take 2 mg by mouth at bedtime. 06/07/22  Yes White, Aaron Edelman A, NP  famotidine (PEPCID) 20 MG tablet TAKE 2 TABLETS TWICE A DAY Patient taking differently: Take 40 mg by mouth 2 (two) times daily. 03/01/22  Yes Ennever, Rudell Cobb, MD  loperamide (IMODIUM) 2 MG capsule Take 2 capsules (4 mg total) by mouth 4 (four) times daily as needed for diarrhea or loose stools. 05/08/22  Yes Aline August, MD  meclizine (ANTIVERT) 25 MG tablet Take 1 tablet (25 mg total) by mouth 3 (three) times daily as needed for dizziness. Patient taking differently: Take 25 mg by mouth 3 (three) times daily. 05/30/22  Yes [name], MD  ondansetron (ZOFRAN) 8 MG tablet Take 8 mg by mouth every 8 (eight) hours as needed for nausea. 03/03/21  Yes [provider]  orphenadrine (NORFLEX) 100 MG tablet TAKE 1 TABLET(100 MG) BY MOUTH AT BEDTIME AS NEEDED FOR MUSCLE SPASMS 06/06/22  Yes Ennever, Rudell Cobb, MD  oxyCODONE (ROXICODONE) 5 MG immediate release tablet Take 1 tablet (5 mg total) by mouth every 4 (four) hours as needed for severe pain or breakthrough pain. 06/04/22  Yes Shah, Pratik D, DO  pantoprazole (PROTONIX) 40 MG tablet Take 40 mg by mouth every morning. 01/01/21  Yes [provider]  promethazine (PHENERGAN) 12.5 MG tablet Take 1 tablet (12.5 mg total) by mouth 2 (two) times daily as needed. Patient taking differently: Take 12.5 mg by mouth 2 (two) times daily as needed for  nausea. 04/21/22  Yes [name], MD  propranolol (INDERAL) 20 MG tablet Take 1 tablet (20 mg total) by mouth 2 (two) times daily. 04/21/22  Yes [name], MD  traMADol (ULTRAM) 50 MG tablet Take 1 tablet (50 mg total) by mouth every 6 (six) hours as needed for moderate pain. 05/08/22  Yes Aline August, MD  ferrous sulfate 325 (65 FE) MG tablet Take 1 tablet (325 mg total) by mouth daily. Patient not taking: Reported on 06/13/2022 06/03/22 06/03/23  Heath Lark D, DO  folic acid (FOLVITE) 1  MG tablet Take 2 tablets (2 mg total) by mouth daily. Patient not taking: Reported on 06/13/2022 05/09/22   Aline August, MD  hydrocortisone (ANUSOL-HC) 2.5 % rectal cream Place rectally 2 (two) times daily. Patient not taking: Reported on 06/13/2022 05/08/22   Aline August, MD  potassium chloride SA (KLOR-CON M) 10 MEQ tablet Take 2 tablets (20 mEq total) by mouth daily. Patient not taking: Reported on 06/13/2022 05/12/22   [name], MD     Vital Signs: BP 123/75 (BP Location: Left Arm)   Pulse 79   Temp 98.4 F (36.9 C) (Oral)   Resp 19   Ht '5\' 6"'$  (1.676 m)   Wt 252 lb 10.4 oz (114.6 kg)   SpO2 96%   BMI 40.78 kg/m   Physical Exam NAD, alert, resting in bed.  MSK: Bilateral lower extremities swollen but soft, R>L.  Various area of tenderness with deep palpation, however not overtly TTP.  Non-weightbearing at this time.   Imaging: VAS US RENAL ARTERY DUPLEX  Result Date: 06/15/2022 ABDOMINAL VISCERAL Patient Name:  TISHIE ALTMANN  Date of Exam:   06/15/2022 Medical Rec #: [id number]       Accession #:    [id number] Date of Birth: [birth year]       Patient Gender: F Patient Age:   61 years Exam Location:  [facility] Procedure:      VAS US RENAL ARTERY DUPLEX Referring Phys: [id number] Bonnielee Haff -------------------------------------------------------------------------------- Indications: AKI High Risk Factors: Hypertension, Diabetes. Limitations: Air/bowel gas, obesity, patient discomfort and Multiple abdominal surgeries. Comparison Study: No prior studies. Performing Technologist: Oliver Hum RVT  Examination Guidelines: A complete evaluation includes B-mode imaging, spectral Doppler, color Doppler, and power Doppler as needed of all accessible portions of each vessel. Bilateral testing is considered an integral part of a complete examination. Limited examinations for reoccurring indications may be performed as noted.  Duplex Findings:  +--------------------+--------+--------+------+------------------+ Mesenteric          PSV cm/sEDV cm/sPlaque     Comments      +--------------------+--------+--------+------+------------------+ Aorta Mid              76      12                            +--------------------+--------+--------+------+------------------+ Celiac Artery Origin                      Unable to insonate +--------------------+--------+--------+------+------------------+ SMA Origin                                Unable to insonate +--------------------+--------+--------+------+------------------+    +------------------+--------+--------+-------+ Right Renal ArteryPSV cm/sEDV cm/sComment +------------------+--------+--------+-------+ Origin               98      19           +------------------+--------+--------+-------+  Proximal            103      30           +------------------+--------+--------+-------+ Mid                  55      22           +------------------+--------+--------+-------+ Distal               58      14           +------------------+--------+--------+-------+ +-----------------+--------+--------+-------+ Left Renal ArteryPSV cm/sEDV cm/sComment +-----------------+--------+--------+-------+ Origin              68      17           +-----------------+--------+--------+-------+ Proximal            68      20           +-----------------+--------+--------+-------+ Mid                 71      13           +-----------------+--------+--------+-------+ Distal              51      17           +-----------------+--------+--------+-------+ +------------+--------+--------+----+-----------+--------+--------+----+ Right KidneyPSV cm/sEDV cm/sRI  Left KidneyPSV cm/sEDV cm/sRI   +------------+--------+--------+----+-----------+--------+--------+----+ Upper Pole  43      14      0.68Upper Pole 56      18      0.69  +------------+--------+--------+----+-----------+--------+--------+----+ Mid         53      13      0.75Mid        54      20      0.64 +------------+--------+--------+----+-----------+--------+--------+----+ Lower Pole  56      22      0.60Lower Pole 46      15      0.68 +------------+--------+--------+----+-----------+--------+--------+----+ Hilar       53      20      0.62Hilar      91      28      0.69 +------------+--------+--------+----+-----------+--------+--------+----+ +------------------+----+------------------+----+ Right Kidney          Left Kidney            +------------------+----+------------------+----+ RAR                   RAR                    +------------------+----+------------------+----+ RAR (manual)      1.36RAR (manual)      1.2  +------------------+----+------------------+----+ Cortex                Cortex                 +------------------+----+------------------+----+ Cortex thickness      Corex thickness        +------------------+----+------------------+----+ Kidney length (cm)9.50Kidney length (cm)8.00 +------------------+----+------------------+----+  Summary: Renal:  Right: Normal size right kidney. Normal right Resisitive Index. No        evidence of right renal artery stenosis. RRV flow present. Left:  Normal size of left kidney. Normal left Resistive Index. No        evidence of left renal artery stenosis. LRV flow present.  *See table(s) above for measurements and observations.  Diagnosing physician: Harold Barban MD  Electronically signed by Harold Barban MD on 06/15/2022 at 8:31:54 PM.    Final    CT VENOGRAM ABD/PEL  Result Date: 06/15/2022 CLINICAL DATA:  IVC thrombosis EXAM: CT VENOGRAM ABDOMEN AND PELVIS TECHNIQUE: Multidetector CT imaging of the abdomen and pelvis was performed using the standard protocol during bolus administration of intravenous contrast. Multiplanar reconstructed images and MIPs were  obtained and reviewed to evaluate the vascular anatomy. RADIATION DOSE REDUCTION: This exam was performed according to the departmental dose-optimization program which includes automated exposure control, adjustment of the mA and/or kV according to patient size and/or use of iterative reconstruction technique. CONTRAST:  117m OMNIPAQUE IOHEXOL 350 MG/ML SOLN COMPARISON:  CT abdomen pelvis 06/12/2022 FINDINGS: VASCULAR Abdominal aorta is normal in caliber. Hepatic, portal, and superior mesenteric veins are patent. Infrarenal IVC filter is present. The legs of the filter extend outside the IVC. The filter is thrombosed with small amount of thrombus extending superior to the filter, approximately 2.5 cm superior to the apex of the filter. Main renal veins are patent. The right ovarian vein is dilated with multiple right retroperitoneal varices. Infrarenal IVC is mildly expanded and thrombosed. Bilateral common iliac, external iliac, common femoral, and visualized femoral veins are mildly expanded and thrombosed. Mild fat stranding seen adjacent to the thrombosed venous segments. NON-VASCULAR Lower chest: No acute abnormality. Hepatobiliary: No focal liver abnormality is seen. Status post cholecystectomy. No biliary dilatation. Pancreas: Unremarkable. No pancreatic ductal dilatation or surrounding inflammatory changes. Spleen: Status post splenectomy. Adrenals/Urinary Tract: Adrenal glands are normal. Interval development of mild right hydronephrosis with transition zone at the ureteropelvic junction. Kidneys and ureters otherwise unremarkable. Bladder is normal. Stomach/Bowel: Moderate distal esophageal wall thickening is new since recent examination from 06/13/2022 and is suspicious for esophagitis.Surgical clips and calcifications again seen along the lesser curvature of the stomach. Mild diffuse distention of the colon with liquid content, consistent with diarrhea. Anastomotic sutures noted at the rectum, cecum,  and mid abdomen small bowel. Lymphatic: No enlarged abdominal or pelvic lymph nodes. Reproductive: Status post hysterectomy. No adnexal masses. Other: Mild subcutaneous and edema of the upper thighs bilaterally. Musculoskeletal: Posterior fusion changes present at L4-L5. Review of the MIP images confirms the above findings. IMPRESSION: 1. Thrombosed inferior vena cava and bilateral common iliac, external iliac, common femoral, and visualized femoral veins. 2. Thrombosed infrarenal IVC filter with small amount of thrombus extending superior to the filter apex. Renal veins are patent. 3. Interval development of mild right hydronephrosis with transition at the ureteropelvic junction. 4. Interval development of moderate distal esophageal wall thickening suspicious for esophagitis. Electronically Signed   By: FMiachel RouxM.D.   On: 06/15/2022 12:38   DG Abd Portable 1V  Result Date: 06/15/2022 CLINICAL DATA:  61year old female with abdominal pain and distension. Appendiceal cancer with metastasis, Pseudomyxoma peritonei. EXAM: PORTABLE ABDOMEN - 1 VIEW COMPARISON:  CT Abdomen and Pelvis 06/13/2022, and earlier. FINDINGS: Portable AP supine views at 0624 hours. Increasing bowel gas since the CT on 06/13/2022 appears to be primarily within the colon. Gas is present in the sigmoid and rectum. No definite dilated small bowel at this time. IVC filter and lower lumbar spine fusion hardware redemonstrated. Stable cholecystectomy clips. Chronic surgical clips in the left mid abdomen and right lower quadrant also. No definite pneumoperitoneum on these supine views. Lumbar spine degeneration with levoconvex scoliosis. No acute osseous abnormality identified. IMPRESSION: Diffusely increased large bowel gas since 06/13/2022 favors Ileus. Distal large bowel obstruction felt  less likely, and no definite small bowel dilatation at this time. Electronically Signed   By: Genevie Ann M.D.   On: 06/15/2022 07:17    Labs:  CBC: Recent  Labs    06/15/22 0431 06/16/22 0417 06/17/22 0411 06/18/22 0439  WBC 11.2* 12.1* 10.8* 11.9*  HGB 7.5* 10.5* 10.1* 9.9*  HCT 24.7* 33.4* 33.3* 31.2*  PLT 297 354 409* 395    COAGS: Recent Labs    05/31/22 1945 06/13/22 1841  INR 1.1 1.3*  APTT  --  32    BMP: Recent Labs    06/14/22 0359 06/15/22 0431 06/16/22 0417 06/17/22 0411  NA 135 140 142 143  K 3.4* 3.5 3.3* 3.8  CL 105 108 109 112*  CO2 '22 23 22 25  '$ GLUCOSE 160* 175* 139* 110*  BUN 40* 33* 28* 20  CALCIUM 8.7* 9.0 9.1 9.2  CREATININE 1.88* 1.34* 1.39* 1.08*  GFRNONAA 30* 46* 44* 59*    LIVER FUNCTION TESTS: Recent Labs    06/13/22 1240 06/14/22 0359 06/15/22 0431 06/17/22 0411  BILITOT <0.1* 0.5 0.4 0.6  AST '27 19 22 26  '$ ALT '16 14 15 17  '$ ALKPHOS 154* 119 130* 138*  PROT 8.3* 6.7 7.4 7.1  ALBUMIN 3.6 3.0* 3.2* 3.2*    Assessment and Plan: Bilateral lower extremity DVT, 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  Consult completed by Dr. Earleen Newport who discussed at length with patient and family. Tentative plan made for venogram, mehcanical/pharmacologic thrombectomy, possible IVC filter removal/revision at [facility]. She has been transferred and is now housed at [facility].  She remains stable without new complaints or worsening of symptoms.  She has tolerated heparin gtt without s/s of bleeding.  IR following for ongoing possible procedure early next week.  Continue current management.  Bedrest per discretion of primary team.   Electronically Signed: Docia Barrier, PA 06/18/2022, 11:14 AM   I spent a total of 15 Minutes at the the patient's bedside AND on the patient's hospital floor or unit, greater than 50% of which was counseling/coordinating care for iliocaval thrombosis, bilateral iliac, bilateral fem-pop DVT.

## 2022-06-18 NOTE — Progress Notes (Signed)
ANTICOAGULATION CONSULT NOTE - Follow Up Consult  Pharmacy Consult for Heparin Indication:  VTE  Allergies  Allergen Reactions   Penicillins Shortness Of Breath    Other reaction(s): Irregular Heart Rate, Other (See Comments) Rapid heartrate    Alprazolam Hives and Other (See Comments)    Hard to arouse, unresponsiveness   Ativan [Lorazepam] Other (See Comments)    Note: tolerates midazolam fine Face & Throat Swelling.   Corticosteroids Other (See Comments)    Other reaction(s): Other (see comments) Psychotic behaviour     Erythromycin     Other reaction(s): Other (See Comments) Severe stomach pain    Prednisone Other (See Comments)    Anxiety & Nervous Breakdown.   Savella  [Milnacipran]    Prednisolone Anxiety    Patient Measurements: Height: '5\' 6"'$  (167.6 cm) Weight: 114.6 kg (252 lb 10.4 oz) IBW/kg (Calculated) : 59.3 Heparin Dosing Weight: 84.7 kg  Vital Signs: Temp: 98.4 F (36.9 C) (08/26 0900) Temp Source: Oral (08/26 0900) BP: 123/75 (08/26 0900) Pulse Rate: 79 (08/26 0449)  Labs: Recent Labs    06/16/22 0417 06/17/22 0411 06/17/22 1203 06/17/22 1827 06/18/22 0439 06/18/22 1531  HGB 10.5* 10.1*  --   --  9.9*  --   HCT 33.4* 33.3*  --   --  31.2*  --   PLT 354 409*  --   --  395  --   HEPARINUNFRC 0.47 0.56   < > 0.52 0.29* 0.25*  CREATININE 1.39* 1.08*  --   --   --   --    < > = values in this interval not displayed.     Estimated Creatinine Clearance: 72.1 mL/min (A) (by C-G formula based on SCr of 1.08 mg/dL (H)).   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 Pt started on IV heparin for extensive DVTs.  Repeat heparin level remains subtherapeutic at 0.25. Discussed with RN - no infusion issues noted and no S/Sx bleeding.  Goal of Therapy:  Heparin level 0.3-0.5 units/ml Monitor platelets by anticoagulation protocol:  Yes   Plan:  Inc heparin to 1600 units/hr Recheck heparin level with am labs  Arrie Senate, PharmD, BCPS, [facility] Clinical Pharmacist [phone] Please check AMION for all [facility] Pharmacy numbers 06/18/2022

## 2022-06-19 DIAGNOSIS — F419 Anxiety disorder, unspecified: Secondary | ICD-10-CM | POA: Diagnosis not present

## 2022-06-19 DIAGNOSIS — N179 Acute kidney failure, unspecified: Secondary | ICD-10-CM | POA: Diagnosis not present

## 2022-06-19 DIAGNOSIS — I824Y3 Acute embolism and thrombosis of unspecified deep veins of proximal lower extremity, bilateral: Secondary | ICD-10-CM | POA: Diagnosis not present

## 2022-06-19 LAB — CBC
HCT: 29.3 % — ABNORMAL LOW (ref 36.0–46.0)
Hemoglobin: 9.4 g/dL — ABNORMAL LOW (ref 12.0–15.0)
MCH: 29.5 pg (ref 26.0–34.0)
MCHC: 32.1 g/dL (ref 30.0–36.0)
MCV: 91.8 fL (ref 80.0–100.0)
Platelets: 422 10*3/uL — ABNORMAL HIGH (ref 150–400)
RBC: 3.19 MIL/uL — ABNORMAL LOW (ref 3.87–5.11)
RDW: 18.5 % — ABNORMAL HIGH (ref 11.5–15.5)
WBC: 11 10*3/uL — ABNORMAL HIGH (ref 4.0–10.5)
nRBC: 2.1 % — ABNORMAL HIGH (ref 0.0–0.2)

## 2022-06-19 LAB — HEPARIN LEVEL (UNFRACTIONATED)
Heparin Unfractionated: 0.41 IU/mL (ref 0.30–0.70)
Heparin Unfractionated: 0.52 IU/mL (ref 0.30–0.70)

## 2022-06-19 LAB — BASIC METABOLIC PANEL
Anion gap: 12 (ref 5–15)
BUN: 13 mg/dL (ref 6–20)
CO2: 21 mmol/L — ABNORMAL LOW (ref 22–32)
Calcium: 8.8 mg/dL — ABNORMAL LOW (ref 8.9–10.3)
Chloride: 108 mmol/L (ref 98–111)
Creatinine, Ser: 0.99 mg/dL (ref 0.44–1.00)
GFR, Estimated: >60 mL/min (ref >60)
Glucose, Bld: 161 mg/dL — ABNORMAL HIGH (ref 70–99)
Potassium: 3 mmol/L — ABNORMAL LOW (ref 3.5–5.1)
Sodium: 141 mmol/L (ref 135–145)

## 2022-06-19 LAB — GLUCOSE, CAPILLARY: Glucose-Capillary: 138 mg/dL — ABNORMAL HIGH (ref 70–99)

## 2022-06-19 MED ORDER — POLYETHYLENE GLYCOL 3350 17 G PO PACK
17.0000 g | PACK | Freq: Every day | ORAL | Status: DC
  Filled 2022-06-19: qty 1

## 2022-06-19 MED ORDER — POTASSIUM CHLORIDE CRYS ER 20 MEQ PO TBCR
40.0000 meq | EXTENDED_RELEASE_TABLET | ORAL | Status: AC
  Administered 2022-06-19 (×2): 40 meq via ORAL
  Filled 2022-06-19 (×2): qty 2

## 2022-06-19 NOTE — Progress Notes (Addendum)
ANTICOAGULATION CONSULT NOTE - Follow Up Consult  Pharmacy Consult for Heparin Indication:  VTE  Allergies  Allergen Reactions   Penicillins Shortness Of Breath    Other reaction(s): Irregular Heart Rate, Other (See Comments) Rapid heartrate    Alprazolam Hives and Other (See Comments)    Hard to arouse, unresponsiveness   Ativan [Lorazepam] Other (See Comments)    Note: tolerates midazolam fine Face & Throat Swelling.   Corticosteroids Other (See Comments)    Other reaction(s): Other (see comments) Psychotic behaviour     Erythromycin     Other reaction(s): Other (See Comments) Severe stomach pain    Prednisone Other (See Comments)    Anxiety & Nervous Breakdown.   Savella  [Milnacipran]    Prednisolone Anxiety    Patient Measurements: Height: '5\' 6"'$  (167.6 cm) Weight: 114.6 kg (252 lb 10.4 oz) IBW/kg (Calculated) : 59.3 Heparin Dosing Weight: 84.7 kg  Vital Signs: Temp: 98.4 F (36.9 C) (08/27 0618) Temp Source: Oral (08/27 0618) BP: 124/70 (08/27 0618) Pulse Rate: 75 (08/27 0618)  Labs: Recent Labs    06/17/22 0411 06/17/22 1203 06/18/22 0439 06/18/22 1531 06/19/22 0206  HGB 10.1*  --  9.9*  --  9.4*  HCT 33.3*  --  31.2*  --  29.3*  PLT 409*  --  395  --  422*  HEPARINUNFRC 0.56   < > 0.29* 0.25* 0.41  CREATININE 1.08*  --   --   --  0.99   < > = values in this interval not displayed.     Estimated Creatinine Clearance: 78.6 mL/min (by C-G formula based on SCr of 0.99 mg/dL).   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 Pt started on IV heparin for extensive DVTs.  Heparin level 0.41 on drip rate 1600 units/hr therapeutic. Last heparin level subtherapeutic. Hgb 9.4 and plt 422. No s/sx bleeding noted in chart. Will check additional level later today given evidence for acute event to ensure therapeutic level.   Addendum:  8 hr HL  remains therapeutic at 0.52. Continue with current rate and obtain HL daily.    Goal of Therapy:  Heparin level 0.3-0.5 units/ml Monitor platelets by anticoagulation protocol: Yes   Plan:  Continue heparin infusion at 1600 units/hr  Will f/u heparin level in 8 hours Monitor daily heparin level and CBC Continue to monitor H&H     Gena Fray, PharmD PGY1 Pharmacy Resident   06/19/2022 8:21 AM  Please check AMION for all [place] numbers

## 2022-06-19 NOTE — Progress Notes (Signed)
Progress Note   Patient: Wendy Barber [id number]:[id number] DOB: [birth year] DOA: 06/13/2022     5 DOS: the patient was seen and examined on 06/19/2022   Brief hospital course: Wendy Barber was admitted to the hospital with the working diagnosis of bilateral lower extremity extensive DVT.   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 On her initial physical examination her blood pressure was 69/40, HR 88, 02 saturation 100%, lungs with no wheezing or rhonchi, heart with S1 and S2 present and rhythmic, abdomen tender to palpation in the epigastric area, no lower extremity edema.   Na 135, K 3,9, cl 103, bicarbonate 19 glucose 148 bun 42 cr 2,0 Wbc 15, hgb 8,8 plt 185  Urine analysis with SG 1,015, > 50 wbc, 11-20 rbc. 30 protein.   Abdominal radiograph with increased bowel gas suggesting ileus.   CT abdomen and pelvis with bilateral pelvic and femoral venous thrombosis, IVC filter in place. Positive cirrhosis.   Subsequent imaging showed bilateral DVT extending up to the IVC.  Started on heparin and admitted.  IR and Oncology consulted.  EKG 74 bpm, left axis deviation, normal intervals, sinus rhythm with ST depression and T wave inversions in V3 to V6.   CT venogram with thrombosed inferior venal cava and bilateral common iliac, external iliac, common femoral and visualized femoral veins.  Thrombosed infrarenal IVC filter with small amount of thrombus extending superior to the filter apex. Renal veins patent.  Mild right hydronephrosis.   Renal arterial US with no evidence of renal artery stenosis.   Patient was placed on heparin drip for anticoagulation and IR was consulted.  08/26 transferred  to [facility] for thrombectomy.  08/27 continue to have pain lower extremities with movement, plan for thrombectomy in am.   Assessment and Plan: * DVT, bilateral lower limbs (HCC) Extensive deep vein thrombosis, bilateral lower extremities and inferior vena cava. Patient has a IVC filter in place.   Anticoagulation with IV heparin Scheduled for thrombectomy on Monday.  Change to scheduled acetaminophen Continue with oxycodone and hydromorphone as needed for pain control Bowel regimen to prevent constipation.   UTI (urinary tract infection) Antibiotic therapy with ceftriaxone She has completed 5 days of therapy.   AKI (acute kidney injury) ([place]) Hypokalemia,   Renal function with serum cr at 0,99, K is 3,0 and serum bicarbonate at 21. Plan to add 80 meq kcl in 2 divided doses and follow up renal function and electrolytes in am.    CAD (coronary artery disease) Continue with antiplatelet therapy.   DM2 (diabetes mellitus, type 2) (HCC) Glucose has been well controlled, holding on insulin therapy.     HTN (hypertension) Continue blood pressure monitoring, holding antihypertensive medications due to risk of hypotension.   Iron deficiency anemia due to chronic blood loss Transfused 1 unit 8/23.  SP IV iron.   Anxiety - Continue bupropion, BuSpar, citalopram  Gastritis Continue antiacid therapy with pantoprazole, famotidine and sucralfate. As needed antiemetics and continue with soft diet as tolerated.   Class 3 obesity (HCC) BMI 40.1  Depression Continue with bupropion, citalopram and buspirone.         Subjective: Patient continue to have lower extremities and back pain only with movement, pain is controlled while at rest, no nausea  or vomiting, no dyspnea or chest pain   Physical Exam: Vitals:   06/18/22 1731 06/18/22 1944 06/18/22 2348 06/19/22 0618  BP: 124/83 (!) 132/90 116/81 124/70  Pulse: 92 91 78 75  Resp: '17 18 20 20  '$ Temp: 98 F (36.7 C) 98.3 F (36.8  C) 99.1 F (37.3 C) 98.4 F (36.9 C)  TempSrc: Oral Oral Oral Oral  SpO2: 100% 100% 92% 96%  Weight:      Height:        Neurology awake and alert ENT with no pallor Cardiovascular with S1 and S2 present and rhythmic Respiratory with no rales or rhonchi Abdomen with no distention  No lower extremity edema  Data Reviewed:    Family Communication: no family at the bedside   Disposition: Status is: Inpatient Remains inpatient appropriate because: plan for thrombectomy lower extremities deep vein thrombosis   Planned Discharge Destination: Home    Author: Tawni Millers, MD 06/19/2022 11:51 AM  For on call review [email or web address].

## 2022-06-20 ENCOUNTER — Inpatient Hospital Stay (HOSPITAL_COMMUNITY): Payer: Medicare Other

## 2022-06-20 DIAGNOSIS — K922 Gastrointestinal hemorrhage, unspecified: Secondary | ICD-10-CM

## 2022-06-20 DIAGNOSIS — C772 Secondary and unspecified malignant neoplasm of intra-abdominal lymph nodes: Secondary | ICD-10-CM | POA: Diagnosis not present

## 2022-06-20 DIAGNOSIS — C182 Malignant neoplasm of ascending colon: Secondary | ICD-10-CM | POA: Diagnosis not present

## 2022-06-20 DIAGNOSIS — R55 Syncope and collapse: Secondary | ICD-10-CM | POA: Diagnosis not present

## 2022-06-20 DIAGNOSIS — N179 Acute kidney failure, unspecified: Secondary | ICD-10-CM | POA: Diagnosis not present

## 2022-06-20 DIAGNOSIS — I824Y3 Acute embolism and thrombosis of unspecified deep veins of proximal lower extremity, bilateral: Secondary | ICD-10-CM | POA: Diagnosis not present

## 2022-06-20 DIAGNOSIS — F419 Anxiety disorder, unspecified: Secondary | ICD-10-CM | POA: Diagnosis not present

## 2022-06-20 HISTORY — PX: IR US GUIDE VASC ACCESS LEFT: IMG2389

## 2022-06-20 HISTORY — PX: IR THROMBECT VENO MECH MOD SED: IMG2300

## 2022-06-20 HISTORY — PX: IR VENO/EXT/BI: IMG677

## 2022-06-20 HISTORY — PX: IR US GUIDE VASC ACCESS RIGHT: IMG2390

## 2022-06-20 HISTORY — PX: IR IMAGING GUIDED PORT INSERTION: IMG5740

## 2022-06-20 HISTORY — PX: IR VENOCAVAGRAM IVC: IMG678

## 2022-06-20 LAB — POCT ACTIVATED CLOTTING TIME
Activated Clotting Time: 143 seconds
Activated Clotting Time: 167 seconds
Activated Clotting Time: 245 seconds
Activated Clotting Time: 203 seconds

## 2022-06-20 LAB — CBC
HCT: 32.4 % — ABNORMAL LOW (ref 36.0–46.0)
Hemoglobin: 9.9 g/dL — ABNORMAL LOW (ref 12.0–15.0)
MCH: 28.9 pg (ref 26.0–34.0)
MCHC: 30.6 g/dL (ref 30.0–36.0)
MCV: 94.5 fL (ref 80.0–100.0)
Platelets: 404 10*3/uL — ABNORMAL HIGH (ref 150–400)
RBC: 3.43 MIL/uL — ABNORMAL LOW (ref 3.87–5.11)
RDW: 19.1 % — ABNORMAL HIGH (ref 11.5–15.5)
WBC: 10.5 10*3/uL (ref 4.0–10.5)
nRBC: 1.3 % — ABNORMAL HIGH (ref 0.0–0.2)

## 2022-06-20 LAB — BASIC METABOLIC PANEL
Anion gap: 7 (ref 5–15)
BUN: 11 mg/dL (ref 6–20)
CO2: 24 mmol/L (ref 22–32)
Calcium: 8.9 mg/dL (ref 8.9–10.3)
Chloride: 111 mmol/L (ref 98–111)
Creatinine, Ser: 0.92 mg/dL (ref 0.44–1.00)
GFR, Estimated: >60 mL/min (ref >60)
Glucose, Bld: 150 mg/dL — ABNORMAL HIGH (ref 70–99)
Potassium: 3.6 mmol/L (ref 3.5–5.1)
Sodium: 142 mmol/L (ref 135–145)

## 2022-06-20 LAB — HEPARIN LEVEL (UNFRACTIONATED): Heparin Unfractionated: 0.34 IU/mL (ref 0.30–0.70)

## 2022-06-20 LAB — GLUCOSE, CAPILLARY: Glucose-Capillary: 139 mg/dL — ABNORMAL HIGH (ref 70–99)

## 2022-06-20 MED ORDER — HEPARIN SODIUM (PORCINE) 1000 UNIT/ML IJ SOLN
INTRAMUSCULAR | Status: AC
  Filled 2022-06-20: qty 10

## 2022-06-20 MED ORDER — FENTANYL CITRATE (PF) 100 MCG/2ML IJ SOLN
INTRAMUSCULAR | Status: AC
  Filled 2022-06-20: qty 2

## 2022-06-20 MED ORDER — MIDAZOLAM HCL 2 MG/2ML IJ SOLN
INTRAMUSCULAR | Status: AC
  Filled 2022-06-20: qty 2

## 2022-06-20 MED ORDER — LIDOCAINE HCL 1 % IJ SOLN
INTRAMUSCULAR | Status: AC
  Administered 2022-06-20: 10 mL
  Filled 2022-06-20: qty 20

## 2022-06-20 MED ORDER — HEPARIN SODIUM (PORCINE) 1000 UNIT/ML IJ SOLN
INTRAMUSCULAR | Status: AC | PRN
  Administered 2022-06-20 (×2): 5000 [IU] via INTRAVENOUS
  Administered 2022-06-20: 2000 [IU] via INTRAVENOUS
  Administered 2022-06-20 (×2): 5000 [IU] via INTRAVENOUS

## 2022-06-20 MED ORDER — HYDROMORPHONE HCL 1 MG/ML IJ SOLN
INTRAMUSCULAR | Status: AC
  Filled 2022-06-20: qty 1

## 2022-06-20 MED ORDER — FENTANYL CITRATE (PF) 100 MCG/2ML IJ SOLN
INTRAMUSCULAR | Status: AC | PRN
  Administered 2022-06-20 (×15): 25 ug via INTRAVENOUS
  Administered 2022-06-20: 50 ug via INTRAVENOUS
  Administered 2022-06-20 (×4): 25 ug via INTRAVENOUS

## 2022-06-20 MED ORDER — LIDOCAINE HCL 1 % IJ SOLN
INTRAMUSCULAR | Status: AC
  Filled 2022-06-20: qty 20

## 2022-06-20 MED ORDER — IOHEXOL 300 MG/ML  SOLN
100.0000 mL | Freq: Once | INTRAMUSCULAR | Status: AC | PRN
  Administered 2022-06-20: 25 mL via INTRAVENOUS

## 2022-06-20 MED ORDER — MECLIZINE HCL 12.5 MG PO TABS
25.0000 mg | ORAL_TABLET | Freq: Three times a day (TID) | ORAL | Status: DC
  Administered 2022-06-20 – 2022-06-26 (×18): 25 mg via ORAL
  Filled 2022-06-20 (×19): qty 2

## 2022-06-20 MED ORDER — IOHEXOL 300 MG/ML  SOLN
100.0000 mL | Freq: Once | INTRAMUSCULAR | Status: AC | PRN
  Administered 2022-06-20: 75 mL via INTRAVENOUS

## 2022-06-20 MED ORDER — LIDOCAINE-EPINEPHRINE 1 %-1:100000 IJ SOLN
INTRAMUSCULAR | Status: AC
  Administered 2022-06-20: 10 mL
  Filled 2022-06-20: qty 1

## 2022-06-20 MED ORDER — HYDROMORPHONE HCL 1 MG/ML IJ SOLN
INTRAMUSCULAR | Status: AC | PRN
  Administered 2022-06-20: 1 mg via INTRAVENOUS

## 2022-06-20 MED ORDER — HEPARIN SOD (PORK) LOCK FLUSH 100 UNIT/ML IV SOLN
INTRAVENOUS | Status: AC
  Administered 2022-06-20: 500 [IU]
  Filled 2022-06-20: qty 5

## 2022-06-20 MED ORDER — MIDAZOLAM HCL 2 MG/2ML IJ SOLN
INTRAMUSCULAR | Status: AC | PRN
  Administered 2022-06-20 (×3): .5 mg via INTRAVENOUS
  Administered 2022-06-20: 1 mg via INTRAVENOUS
  Administered 2022-06-20: .5 mg via INTRAVENOUS
  Administered 2022-06-20: 1 mg via INTRAVENOUS
  Administered 2022-06-20 (×4): .5 mg via INTRAVENOUS
  Administered 2022-06-20: 1 mg via INTRAVENOUS
  Administered 2022-06-20 (×2): .5 mg via INTRAVENOUS
  Administered 2022-06-20 (×2): 1 mg via INTRAVENOUS
  Administered 2022-06-20 (×2): .5 mg via INTRAVENOUS
  Administered 2022-06-20: 1 mg via INTRAVENOUS
  Administered 2022-06-20: .5 mg via INTRAVENOUS

## 2022-06-20 MED ORDER — HEPARIN SOD (PORK) LOCK FLUSH 100 UNIT/ML IV SOLN
INTRAVENOUS | Status: AC
  Filled 2022-06-20: qty 5

## 2022-06-20 NOTE — Sedation Documentation (Signed)
Patient is resting comfortably. VSS °

## 2022-06-20 NOTE — Care Management Important Message (Signed)
Important Message  Patient Details  Name: Wendy Barber MRN: [id number] Date of Birth: 1961-09-26   Medicare Important Message Given:  Yes     Shelda Altes 06/20/2022, 9:17 AM

## 2022-06-20 NOTE — Progress Notes (Signed)
Progress Note   Patient: Wendy Barber ZDG:[id number] DOB: 14-Apr-1961 DOA: 06/13/2022     6 DOS: the patient was seen and examined on 06/20/2022   Brief hospital course: Mrs. Forge was admitted to the hospital with the working diagnosis of bilateral lower extremity extensive DVT.   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 On her initial physical examination her blood pressure was 69/40, HR 88, 02 saturation 100%, lungs with no wheezing or rhonchi, heart with S1 and S2 present and rhythmic, abdomen tender to palpation in the epigastric area, no lower extremity edema.   Na 135, K 3,9, cl 103, bicarbonate 19 glucose 148 bun 42 cr 2,0 Wbc 15, hgb 8,8 plt 185  Urine analysis with SG 1,015, > 50 wbc, 11-20 rbc. 30 protein.   Abdominal radiograph with increased bowel gas suggesting ileus.   CT abdomen and pelvis with bilateral pelvic and femoral venous thrombosis, IVC filter in place. Positive cirrhosis.   Subsequent imaging showed bilateral DVT extending up to the IVC.  Started on heparin and admitted.  IR and Oncology consulted.  EKG 74 bpm, left axis deviation, normal intervals, sinus rhythm with ST depression and T wave inversions in V3 to V6.   CT venogram with thrombosed inferior venal cava and bilateral common iliac, external iliac, common femoral and visualized femoral veins.  Thrombosed infrarenal IVC filter with small amount of thrombus extending superior to the filter apex. Renal veins patent.  Mild right hydronephrosis.   Renal arterial US with no evidence of renal artery stenosis.   Patient was placed on heparin drip for anticoagulation and IR was consulted.  08/26 transferred  to [facility] for thrombectomy.  08/27 continue to have pain lower extremities with movement, plan for thrombectomy in am.   Assessment and Plan: * DVT, bilateral lower limbs (HCC) Extensive deep vein thrombosis, bilateral lower extremities and inferior vena cava. Patient has a IVC filter in place.   Anticoagulation with IV heparin  Change to scheduled acetaminophen Continue with oxycodone and hydromorphone as needed for pain control Bowel regimen to prevent constipation.   08/28 IVC and bilateral lower extremity venogram and mechanical thrombolysis L IJ port catheter placement.   Plan to follow up on IR recommendations.   UTI (urinary tract infection) Antibiotic therapy with ceftriaxone She has completed 5 days of therapy.   AKI (acute kidney injury) ([place]) Hypokalemia,   K has improved to 3,6, renal function continue to be stable with serum cr at 0,92 with bicarbonate at 24. Continue close follow up on electrolytes.   CAD (coronary artery disease) Continue with antiplatelet therapy.   DM2 (diabetes mellitus, type 2) (HCC) Glucose has been well controlled, holding on insulin therapy.     HTN (hypertension) Continue blood pressure monitoring, holding antihypertensive medications due to risk of hypotension.   Iron deficiency anemia due to chronic blood loss Transfused 1 unit 8/23.  SP IV iron.   Anxiety - Continue bupropion, BuSpar, citalopram  Gastritis Continue antiacid therapy with pantoprazole, famotidine and sucralfate. As needed antiemetics and continue with soft diet as tolerated.   Class 3 obesity (HCC) BMI 40.1  Depression Continue with bupropion, citalopram and buspirone.         Subjective: Patient post procedure with somnolence but easy to  arouse and respond to questions   Physical Exam: Vitals:   06/20/22 1620 06/20/22 1625 06/20/22 1645 06/20/22 1707  BP: (!) 126/93 113/74 (!) 101/90 (!) 108/55  Pulse: (!) 101 (!) 106 (!) 104 (!) 104  Resp: 17  (!) '28 19 18  '$ Temp:    98.3 F (36.8 C)  TempSrc:    Oral  SpO2: 100% 100% 100% 98%  Weight:      Height:       Neurology somnolent but easy to arouse ENT with mild pallor Cardiovascular with S1 and S2 present and rhythmic Respiratory with no raled or wheezing Abdomen with no distention  Positive lower extremity edema  Data Reviewed:    Family Communication: I spoke with patient's husband and daughter at the bedside, we talked in detail about patient's condition, plan of care and prognosis and all questions were addressed.   Disposition: Status is: Inpatient Remains inpatient appropriate because: post thrombectomy.   Planned Discharge Destination: Home   Author: Tawni Millers, MD 06/20/2022 6:00 PM  For on call review [email or web address].

## 2022-06-20 NOTE — Sedation Documentation (Signed)
Pt states that she is very nervous about the procedures. Comforted pt, reassured pt.

## 2022-06-20 NOTE — Sedation Documentation (Signed)
Patient is resting comfortably. 

## 2022-06-20 NOTE — Sedation Documentation (Signed)
Vital signs stable. 

## 2022-06-20 NOTE — Sedation Documentation (Signed)
VSS, pt complains of pain at surgical site

## 2022-06-20 NOTE — Sedation Documentation (Signed)
ACT 143 

## 2022-06-20 NOTE — Sedation Documentation (Signed)
Pt repositioned to left side assisted by Rns an IR techs. Pt tolerated well. VSS

## 2022-06-20 NOTE — Sedation Documentation (Signed)
ACT = 245

## 2022-06-20 NOTE — Progress Notes (Signed)
Ms. Lehew is now over on 4 E. at [facility]  She was brought over on Friday night.  She is doing okay.  Hopefully, she will have the thrombectomy today.  She is still having a hard time standing and walking because of leg pain and swelling.  Continues on the heparin infusion.  She will have a Port-A-Cath placed for IV access in the future.  She has very limited IV access peripherally.  Patient does have another E. coli urinary tract infection.  This is probably the same bacteria that she had last year.  She has been on antibiotics which should help cover this.  There are no labs back yet today.  She is not eating all that much.  Again she is really not all that active because of the legs.  All of her vital signs are stable.  Temperature 98.3.  Pulse 68.  Blood pressure 141/95.  Her lungs sound clear bilaterally.  She has good air movement bilaterally.  Cardiac exam regular rate and rhythm.  There are no murmurs, rubs or bruits.  Abdomen is soft.  She is obese.  She has decent bowel sounds.  There is a little bit of tenderness to palpation.  Extremities does show some swelling in the legs bilaterally.  Neurological exam is nonfocal.  Skin exam shows some scattered ecchymoses.  Hopefully, once [place] will have the thrombectomy today.  Her renal function is fine.  She has the E. coli urinary tract infection.  I think she was on antibiotics with ceftriaxone.  It  probably would not be about 80 to repeat the urine to make sure the infection is gone.  As always, I know that IR will do a fantastic job with the thrombectomy and will also with the Port-A-Cath insertion.  When she has these done, we will get her over to [facility] to do as an outpatient.  I know that she we will get incredible care from the wonderful staff over here on 4 E.  [name], MD  Psalm 41:1

## 2022-06-20 NOTE — Progress Notes (Signed)
ANTICOAGULATION CONSULT NOTE - Follow Up Consult  Pharmacy Consult for Heparin Indication:  VTE  Allergies  Allergen Reactions   Penicillins Shortness Of Breath    Other reaction(s): Irregular Heart Rate, Other (See Comments) Rapid heartrate    Alprazolam Hives and Other (See Comments)    Hard to arouse, unresponsiveness   Ativan [Lorazepam] Other (See Comments)    Note: tolerates midazolam fine Face & Throat Swelling.   Corticosteroids Other (See Comments)    Other reaction(s): Other (see comments) Psychotic behaviour     Erythromycin     Other reaction(s): Other (See Comments) Severe stomach pain    Prednisone Other (See Comments)    Anxiety & Nervous Breakdown.   Savella  [Milnacipran]    Prednisolone Anxiety    Patient Measurements: Height: '5\' 6"'$  (167.6 cm) Weight: 120.2 kg (264 lb 15.9 oz) IBW/kg (Calculated) : 59.3 Heparin Dosing Weight: 84.7 kg  Vital Signs: Temp: 98.3 F (36.8 C) (08/27 2353) Temp Source: Oral (08/27 2353) BP: 141/95 (08/27 2353) Pulse Rate: 68 (08/27 1953)  Labs: Recent Labs    06/18/22 0439 06/18/22 1531 06/19/22 0206 06/19/22 0937 06/20/22 0610  HGB 9.9*  --  9.4*  --  9.9*  HCT 31.2*  --  29.3*  --  32.4*  PLT 395  --  422*  --  404*  HEPARINUNFRC 0.29*   < > 0.41 0.52 0.34  CREATININE  --   --  0.99  --  0.92   < > = values in this interval not displayed.     Estimated Creatinine Clearance: 87 mL/min (by C-G formula based on SCr of 0.92 mg/dL).   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 Pt started on IV heparin for extensive DVTs.  Heparin level 0.34 units/mL on heparin 1600 units/hr which is therapeutic. CBC stable. No s/sx bleeding noted in chart.   Goal of Therapy:  Heparin level 0.3-0.5 units/ml Monitor platelets by anticoagulation protocol: Yes   Plan:  Continue heparin infusion at 1600  units/hr  Monitor daily heparin level and CBC Continue to monitor H&H     Erskine Speed, PharmD 06/20/2022 7:30 AM  Please check AMION for all [place] numbers

## 2022-06-20 NOTE — Procedures (Signed)
  Procedure:  1. IVC and bilateral lower extremity venogram and mechanical thrombolysis 2. L IJ Port catheter placement   Preprocedure diagnosis: Iliocaval occlusive thrombus. The primary encounter diagnosis was Syncope, unspecified syncope type. 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   Postprocedure diagnosis: same EBL:    minimal Complications:   none immediate   See full dictation in [facility].  Dillard Cannon MD Main # [phone] Pager  [phone] Mobile [phone]

## 2022-06-20 NOTE — Sedation Documentation (Signed)
Pt repositioned to supine from prone assisted by RN and IR techs. Pt tolerated well.

## 2022-06-21 DIAGNOSIS — F419 Anxiety disorder, unspecified: Secondary | ICD-10-CM | POA: Diagnosis not present

## 2022-06-21 DIAGNOSIS — N179 Acute kidney failure, unspecified: Secondary | ICD-10-CM | POA: Diagnosis not present

## 2022-06-21 DIAGNOSIS — I824Y3 Acute embolism and thrombosis of unspecified deep veins of proximal lower extremity, bilateral: Secondary | ICD-10-CM | POA: Diagnosis not present

## 2022-06-21 LAB — BASIC METABOLIC PANEL
Anion gap: 9 (ref 5–15)
BUN: 13 mg/dL (ref 6–20)
CO2: 21 mmol/L — ABNORMAL LOW (ref 22–32)
Calcium: 8.4 mg/dL — ABNORMAL LOW (ref 8.9–10.3)
Chloride: 111 mmol/L (ref 98–111)
Creatinine, Ser: 0.8 mg/dL (ref 0.44–1.00)
GFR, Estimated: >60 mL/min (ref >60)
Glucose, Bld: 143 mg/dL — ABNORMAL HIGH (ref 70–99)
Potassium: 3.9 mmol/L (ref 3.5–5.1)
Sodium: 141 mmol/L (ref 135–145)

## 2022-06-21 LAB — POCT ACTIVATED CLOTTING TIME
Activated Clotting Time: 239 seconds
Activated Clotting Time: 269 seconds

## 2022-06-21 LAB — HEPARIN LEVEL (UNFRACTIONATED)
Heparin Unfractionated: 0.83 IU/mL — ABNORMAL HIGH (ref 0.30–0.70)
Heparin Unfractionated: 1.07 IU/mL — ABNORMAL HIGH (ref 0.30–0.70)

## 2022-06-21 LAB — GLUCOSE, CAPILLARY: Glucose-Capillary: 128 mg/dL — ABNORMAL HIGH (ref 70–99)

## 2022-06-21 MED ORDER — ENOXAPARIN SODIUM 120 MG/0.8ML IJ SOSY
120.0000 mg | PREFILLED_SYRINGE | Freq: Two times a day (BID) | INTRAMUSCULAR | Status: DC
  Administered 2022-06-21 – 2022-06-22 (×3): 120 mg via SUBCUTANEOUS
  Filled 2022-06-21 (×4): qty 0.8

## 2022-06-21 MED ORDER — GABAPENTIN 100 MG PO CAPS
100.0000 mg | ORAL_CAPSULE | Freq: Three times a day (TID) | ORAL | Status: DC
Start: 2022-06-21 — End: 2022-06-21
  Filled 2022-06-21: qty 1

## 2022-06-21 MED ORDER — OXYCODONE HCL ER 10 MG PO T12A
10.0000 mg | EXTENDED_RELEASE_TABLET | Freq: Two times a day (BID) | ORAL | Status: DC
  Administered 2022-06-21 – 2022-06-26 (×11): 10 mg via ORAL
  Filled 2022-06-21 (×11): qty 1

## 2022-06-21 MED ORDER — CHLORHEXIDINE GLUCONATE CLOTH 2 % EX PADS
6.0000 | MEDICATED_PAD | Freq: Every day | CUTANEOUS | Status: DC
  Administered 2022-06-22 – 2022-06-26 (×3): 6 via TOPICAL

## 2022-06-21 MED ORDER — OXYCODONE HCL 5 MG PO TABS
5.0000 mg | ORAL_TABLET | ORAL | Status: DC | PRN
  Administered 2022-06-23 – 2022-06-26 (×4): 5 mg via ORAL
  Filled 2022-06-21 (×4): qty 1

## 2022-06-21 NOTE — Progress Notes (Addendum)
Progress Note   Patient: Wendy Barber ZDG:[id number] DOB: August 23, 1961 DOA: 06/13/2022     7 DOS: the patient was seen and examined on 06/21/2022   Brief hospital course: Wendy Barber was admitted to the hospital with the working diagnosis of bilateral lower extremity extensive DVT.   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 On her initial physical examination her blood pressure was 69/40, HR 88, 02 saturation 100%, lungs with no wheezing or rhonchi, heart with S1 and S2 present and rhythmic, abdomen tender to palpation in the epigastric area, no lower extremity edema.   Na 135, K 3,9, cl 103, bicarbonate 19 glucose 148 bun 42 cr 2,0 Wbc 15, hgb 8,8 plt 185  Urine analysis with SG 1,015, > 50 wbc, 11-20 rbc. 30 protein.   Abdominal radiograph with increased bowel gas suggesting ileus.   CT abdomen and pelvis with bilateral pelvic and femoral venous thrombosis, IVC filter in place. Positive cirrhosis.   Subsequent imaging showed bilateral DVT extending up to the IVC.  Started on heparin and admitted.  IR and Oncology consulted.  EKG 74 bpm, left axis deviation, normal intervals, sinus rhythm with ST depression and T wave inversions in V3 to V6.   CT venogram with thrombosed inferior venal cava and bilateral common iliac, external iliac, common femoral and visualized femoral veins.  Thrombosed infrarenal IVC filter with small amount of thrombus extending superior to the filter apex. Renal veins patent.  Mild right hydronephrosis.   Renal arterial US with no evidence of renal artery stenosis.   Patient was placed on heparin drip for anticoagulation and IR was consulted.  08/26 transferred  to [facility] for thrombectomy.  08/27 continue to have pain lower extremities with movement. 08/28 IVC and bilateral lower extremity venogram and mechanical thrombectomy. L IJ Port catheter placement.  08/29 transitioned to enoxaparin from IV heparin   Assessment and Plan: * DVT, bilateral lower limbs (HCC) Extensive deep vein thrombosis, bilateral lower extremities and inferior vena cava. IVC filter.  08/28 IVC and bilateral lower extremity venogram and mechanical thrombolysis L IJ port catheter placement.   Patient has been on heparin IV for anticoagulation, will transition to enoxaparin today. Patient with persistent pain in her lower extremities.  Plan to add long acting oxycodone 10 mg bid, continue with short acting oxycodone 5 mg prn, and IV hydromorphone PRN.  Scheduled acetaminophen. Patient had side effects to gabapentin in the past.   Avoid non steroidal anti inflammatory medications due to full anticoagulation.   Plan to remove IVC filter in am.   UTI (urinary tract infection) Antibiotic therapy with ceftriaxone She has completed 5 days of therapy.   AKI (acute kidney injury) ([place]) Hypokalemia,   Renal failure has resolved, her serum cr today is 0,80 with K at 3,9 and serum bicarbonate at 21.  Continue to follow up renal function as needed.   CAD (coronary artery disease) Continue with antiplatelet therapy.   DM2 (diabetes mellitus, type 2) (HCC) Glucose has been well controlled, holding on insulin therapy. Fasting glucose is 143 mg/dl today.   HTN (hypertension) Systolic blood pressure 951 to 130 mmHg, plan to continue to hold on antihypertensive medications.   Iron deficiency anemia due to chronic blood loss Transfused 1 unit 8/23.  SP IV iron.   Anxiety - Continue bupropion, BuSpar, citalopram  Gastritis Continue antiacid therapy with pantoprazole, famotidine and sucralfate. As needed antiemetics and continue with soft diet as tolerated.    Depression Continue with bupropion, citalopram and buspirone.   Class 3 obesity (HCC) BMI 40.1        Subjective: Patient continue to have severe pain at her lower extremities, worse while trying to move or ambulate, no nausea or vomiting, no dyspnea or chest pain  Physical Exam: Vitals:   06/20/22 2343 06/21/22 0430 06/21/22 0827 06/21/22 1318  BP: 105/89 133/79 134/75 124/68  Pulse: (!) 108 92 94 99  Resp: '19 19 16 16  '$ Temp: 98.6 F (37 C) 98.4 F (36.9 C) 98.4 F (36.9 C) 98.3 F (36.8 C)  TempSrc: Oral Oral Oral Oral  SpO2: 96% 96% 100% 96%  Weight:      Height:       Neurology awake and alert ENT with no pallor Cardiovascular with S1 and S2 present and with gallops Respiratory with no rales or wheezing Abdomen with no distention  Positive non pitting lower extremity edema  Data Reviewed:    Family Communication: no family at the bedside   Disposition: Status is: Inpatient Remains inpatient appropriate because: pending IVC filter to be removed. Possible SNF   Planned Discharge Destination:  to be determined      Author: Tawni Millers, MD 06/21/2022 1:38 PM  For on call review [email or web address].

## 2022-06-21 NOTE — Progress Notes (Signed)
ANTICOAGULATION CONSULT NOTE - Follow Up Consult  Pharmacy Consult for Enoxaparin Indication:  VTE  Allergies  Allergen Reactions   Penicillins Shortness Of Breath    Other reaction(s): Irregular Heart Rate, Other (See Comments) Rapid heartrate    Alprazolam Hives and Other (See Comments)    Hard to arouse, unresponsiveness   Ativan [Lorazepam] Other (See Comments)    Note: tolerates midazolam fine Face & Throat Swelling.   Corticosteroids Other (See Comments)    Other reaction(s): Other (see comments) Psychotic behaviour     Erythromycin     Other reaction(s): Other (See Comments) Severe stomach pain    Prednisone Other (See Comments)    Anxiety & Nervous Breakdown.   Savella  [Milnacipran]    Prednisolone Anxiety    Patient Measurements: Height: '5\' 6"'$  (167.6 cm) Weight: 120.2 kg (264 lb 15.9 oz) IBW/kg (Calculated) : 59.3 Heparin Dosing Weight: 84.7 kg  Vital Signs: Temp: 98.4 F (36.9 C) (08/29 0827) Temp Source: Oral (08/29 0827) BP: 134/75 (08/29 0827) Pulse Rate: 94 (08/29 0827)  Labs: Recent Labs    06/19/22 0206 06/19/22 5681 06/20/22 0610 06/21/22 0314 06/21/22 0843  HGB 9.4*  --  9.9* 7.6*  --   HCT 29.3*  --  32.4* 24.4*  --   PLT 422*  --  404* 277  --   HEPARINUNFRC 0.41   < > 0.34 0.83* 1.07*  CREATININE 0.99  --  0.92 0.80  --    < > = values in this interval not displayed.     Estimated Creatinine Clearance: 100 mL/min (by C-G formula based on SCr of 0.8 mg/dL).   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 Pt started on IV heparin for extensive DVTs and now being transition to enoxaparin per heme oncs recommendations.  Daily heparin level 0.83 (supratherapeutic) without changes to heparin rate when previously therapeutic. She did receive heparin boluses during her procedure yesterday; however, would expect these to be  not contributing. Heparin infusion running without complications overnight. Stat repeat heparin level continued to rise to 1.07. No bleeding per RN.  Patient cleared to transition from heparin to enoxaparin for continuation of treatment. CrCl stable at 191m/min. Adjusted body weight is 84kg and total body weight 120kg  Goal of Therapy:  Anti-Xa level 0.6-1 units/ml 4hrs after LMWH dose given Monitor platelets by anticoagulation protocol: Yes   Plan:  Stop heparin infusion Start enoxaparin '120mg'$  Q12H SQ (will schedule first dose for 1200 to allow heparin level to fall within therapeutic range) Anti-Xa level once at steady state Daily CBC, Continue to monitor H&H    LErskine Speed PharmD 06/21/2022 9:28 AM  Please check AMION for all MDoylinenumbers

## 2022-06-21 NOTE — Progress Notes (Signed)
Came to bedside to further discuss plan for attempt at [facility] filter retrieval tomorrow. Husband now at bedside. Procedure discussed in detail and additional questions answered.  Pursestring sutures from (R)IJ and (B)popliteal veins removed as well. Plan for procedure tomorrow. Cont hep gtt.  Ascencion Dike PA-C Interventional Radiology 06/21/2022 3:51 PM

## 2022-06-21 NOTE — Discharge Instructions (Signed)
Information on my medicine - LOVENOX (enoxaparin)  This medication education was reviewed with me or my healthcare representative as part of my discharge preparation.    Why was lovenox prescribed for you? Lovenox was prescribed to treat blood clots that may have been found in the veins of your legs (deep vein thrombosis) or in your lungs (pulmonary embolism) and to reduce the risk of them occurring again.  What do You need to know about Lovenox? Lovenox  is given by a shot under the skin by you or a care provider. Lovenox is injected into the right or left side of your abdomen at least 2 inches away from your belly button. Clean the injection site with an alcohol swab and let dry. Give only the amount prescribed to you by your healthcare provider and do not change the dose unless instructed by your health care provider.  Try to administer the dose(s) about the same time every day. To minimize bruising and irritation, rotate where the shots are given.  Store Lovenox at room temperature, away from heat and direct light. Do not refrigerate or freeze the syringes. Keep away from children or pets.  Take Lovenox exactly as prescribed and DO NOT stop taking Lovenox without talking to the doctor who prescribed the medication.  Stopping may increase your risk of developing a new blood clot.    After discharge, you should have regular appointments with your healthcare provider.  Dispose of used syringe and cap in a sharps disposal container once used (if sharps disposal container unavailable, use any hard plastic jug that can be securely closed).     What do you do if you miss a dose? If a dose of Lovenox is not administered at the scheduled time, administer it as soon as possible on the same day and then resume regular administration schedule. The dose should not be doubled to make up for a missed dose.  Important Safety Information A possible side effect of Lovenox is bleeding. You  should call your healthcare provider right away if you experience any of the following: Bleeding from an injury or your nose that does not stop. Unusual colored urine (red or dark brown) or unusual colored stools (red or black). Unusual bruising for unknown reasons. A serious fall or if you hit your head (even if there is no bleeding).  Some medicines may interact with Lovenox and might increase your risk of bleeding or clotting while on Lovenox. To help avoid this, consult your healthcare provider or pharmacist prior to using any new prescription or non-prescription medications, including herbals, vitamins, non-steroidal anti-inflammatory drugs (NSAIDs) and supplements.  This website has more information on Lovenox (enoxaparin): [email or web address]

## 2022-06-21 NOTE — Consult Note (Signed)
Chief Complaint: Patient was seen in consultation today for Inferior vena cava filter retrieval Chief Complaint  Patient presents with   Loss of Consciousness   at the request of Dr Lurline Del  Supervising Physician: Ruthann Cancer  Patient Status: [facility] - In-pt  History of Present Illness: Wendy Barber is a 61 y.o. female   Known to IR Bilat lower extr venogram and mechanical thrombolysis 8/28  Hx appendiceal Cancer and pseudomyxoma peritonei DM; IDA; autoimmune hepatitis And known Hx B Lower extr DVT and PE with Inferior vena cava filter placement 2005 while in [place], [place] hx of hypercoagulable state Several immediate family member also with IVC filters in place  To [facility] with leg swelling; near syncope On and off Xarelto--- was taken off while in [facility] per Pt Determined to have Bilat pelvic and femoral venous thrombosis (with IVC filter in place) Thrombosis did extend into IVC--- heparin started and transferred to [facility]  8/28: IR procedure: Procedure:                       1. IVC and bilateral lower extremity venogram and mechanical thrombolysis 2. L IJ Port catheter placement   Preprocedure diagnosis:          Iliocaval occlusive thrombus. The primary encounter diagnosis was Syncope, unspecified syncope type. 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[place]), and Acute lower GI bleeding were also pertinent to this visit  Determine best plan  is to remove IVC filter at this point secondary length of time filter has been in place Dr Vernard Gambles and Dr [name] have reviewed pt status chart and agree to procedure Inferior vena cava filter retrieval is scheduled for 8/30 in IR  Past Medical History:  Diagnosis Date   Arthritis    Back pain    Cancer [facility])    pseudomyxoma peritonei   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[place])  03/19/2020   Chronic fatigue syndrome    Diabetes mellitus without complication ([place])    DVT of deep femoral vein, left ([place]) 03/19/2020   Fibromyalgia    Goals of care, counseling/discussion 03/19/2020   Hypertension    Iron deficiency anemia due to chronic blood loss 05/14/2021   Malignant pseudomyxoma peritonei ([place]) 03/19/2020   Pernicious anemia 05/14/2021   Presence of IVC filter 03/19/2020   Pulmonary embolism, bilateral ([place]) 03/19/2020    Past Surgical History:  Procedure Laterality Date   ABDOMINAL HYSTERECTOMY     ABDOMINAL SURGERY     ACHILLES TENDON REPAIR     APPENDECTOMY     ARTHROPLASTY     CARPAL TUNNEL RELEASE     CHOLECYSTECTOMY     IR CATHETER TUBE CHANGE  02/02/2021   IR CATHETER TUBE CHANGE  02/25/2021   IR IMAGING GUIDED PORT INSERTION  06/20/2022   IR RADIOLOGIST EVAL & MGMT  02/24/2021   IR RADIOLOGIST EVAL & MGMT  03/10/2021   IR US GUIDE BX ASP/DRAIN  11/25/2019   JOINT REPLACEMENT     KNEE ARTHROSCOPY     ORIF ANKLE FRACTURE Right 08/27/2019   Procedure: OPEN REDUCTION INTERNAL FIXATION RIGHT ANKLE FRACTURE;  Surgeon: Renette Butters, MD;  Location: [facility];  Service: Orthopedics;  Laterality: Right;   perineorrophy     TONGUE BIOPSY      Allergies: Penicillins, Alprazolam, Ativan [lorazepam], Corticosteroids, Erythromycin, Prednisone, Savella  [milnacipran], and  Prednisolone  Medications: Prior to Admission medications   Medication Sig Start Date End Date Taking? Authorizing Provider  alum & mag hydroxide-simeth (MAALOX/MYLANTA) 200-200-20 MG/5ML suspension Take 15 mLs by mouth every 6 (six) hours as needed for indigestion or heartburn. 05/08/22  Yes Aline August, MD  buPROPion (WELLBUTRIN XL) 150 MG 24 hr tablet Take 1 tablet (150 mg total) by mouth daily. 04/27/22  Yes White, Aaron Edelman A, NP  busPIRone (BUSPAR) 15 MG tablet Take 1 tablet (15 mg total) by mouth 3 (three) times daily. 04/05/22  Yes Lesle Chris A, NP  citalopram (CELEXA) 40 MG tablet Take 1  tablet (40 mg total) by mouth daily. 04/27/22  Yes Elwanda Brooklyn, NP  diazepam (VALIUM) 5 MG tablet Take 1 tablet 30-40 minutes prior to MRI 06/13/22  Yes Jaffe, Adam R, DO  dicyclomine (BENTYL) 20 MG tablet Take 1 tablet (20 mg total) by mouth in the morning, at noon, in the evening, and at bedtime. 05/08/22  Yes Aline August, MD  diphenhydrAMINE (SOMINEX) 25 MG tablet Take 25 mg by mouth daily as needed for itching.   Yes [provider]  diphenoxylate-atropine (LOMOTIL) 2.5-0.025 MG tablet TAKE 2 TABLETS IN THE MORNING, AT NOON, IN THE EVENING AND AT BEDTIME Patient taking differently: Take 2 tablets by mouth 4 (four) times daily. 04/22/22  Yes Ennever, Rudell Cobb, MD  eszopiclone (LUNESTA) 2 MG TABS tablet TAKE 1 TABLET(2 MG) BY MOUTH AT BEDTIME AS NEEDED FOR SLEEP Patient taking differently: Take 2 mg by mouth at bedtime. 06/07/22  Yes White, Aaron Edelman A, NP  famotidine (PEPCID) 20 MG tablet TAKE 2 TABLETS TWICE A DAY Patient taking differently: Take 40 mg by mouth 2 (two) times daily. 03/01/22  Yes Ennever, Rudell Cobb, MD  loperamide (IMODIUM) 2 MG capsule Take 2 capsules (4 mg total) by mouth 4 (four) times daily as needed for diarrhea or loose stools. 05/08/22  Yes Aline August, MD  meclizine (ANTIVERT) 25 MG tablet Take 1 tablet (25 mg total) by mouth 3 (three) times daily as needed for dizziness. Patient taking differently: Take 25 mg by mouth 3 (three) times daily. 05/30/22  Yes [name], MD  ondansetron (ZOFRAN) 8 MG tablet Take 8 mg by mouth every 8 (eight) hours as needed for nausea. 03/03/21  Yes [provider]  orphenadrine (NORFLEX) 100 MG tablet TAKE 1 TABLET(100 MG) BY MOUTH AT BEDTIME AS NEEDED FOR MUSCLE SPASMS 06/06/22  Yes Ennever, Rudell Cobb, MD  oxyCODONE (ROXICODONE) 5 MG immediate release tablet Take 1 tablet (5 mg total) by mouth every 4 (four) hours as needed for severe pain or breakthrough pain. 06/04/22  Yes Shah, Pratik D, DO  pantoprazole (PROTONIX) 40 MG tablet  Take 40 mg by mouth every morning. 01/01/21  Yes [provider]  promethazine (PHENERGAN) 12.5 MG tablet Take 1 tablet (12.5 mg total) by mouth 2 (two) times daily as needed. Patient taking differently: Take 12.5 mg by mouth 2 (two) times daily as needed for nausea. 04/21/22  Yes [name], MD  propranolol (INDERAL) 20 MG tablet Take 1 tablet (20 mg total) by mouth 2 (two) times daily. 04/21/22  Yes [name], MD  traMADol (ULTRAM) 50 MG tablet Take 1 tablet (50 mg total) by mouth every 6 (six) hours as needed for moderate pain. 05/08/22  Yes Aline August, MD  ferrous sulfate 325 (65 FE) MG tablet Take 1 tablet (325 mg total) by mouth daily. Patient not taking: Reported on 06/13/2022 06/03/22  06/03/23  Manuella Ghazi, Pratik D, DO  folic acid (FOLVITE) 1 MG tablet Take 2 tablets (2 mg total) by mouth daily. Patient not taking: Reported on 06/13/2022 05/09/22   Aline August, MD  hydrocortisone (ANUSOL-HC) 2.5 % rectal cream Place rectally 2 (two) times daily. Patient not taking: Reported on 06/13/2022 05/08/22   Aline August, MD  potassium chloride SA (KLOR-CON M) 10 MEQ tablet Take 2 tablets (20 mEq total) by mouth daily. Patient not taking: Reported on 06/13/2022 05/12/22   [name], MD     Family History  Problem Relation Age of Onset   Depression Mother    Drug abuse Sister    Suicidality Sister    Alcohol abuse Brother    Drug abuse Brother     Social History   Socioeconomic History   Marital status: Married    Spouse name: Not on file   Number of children: 3   Years of education: Not on file   Highest education level: Some college, no degree  Occupational History   Occupation: disability  Tobacco Use   Smoking status: Never   Smokeless tobacco: Never  Vaping Use   Vaping Use: Never used  Substance and Sexual Activity   Alcohol use: No   Drug use: Never   Sexual activity: Yes    Birth control/protection: None    Comment: hysterectomy  Other Topics  Concern   Not on file  Social History Narrative   Right handed   One story home   Drinks occasional caffeine   Social Determinants of Health   Financial Resource Strain: Not on file  Food Insecurity: Not on file  Transportation Needs: Not on file  Physical Activity: Not on file  Stress: Not on file  Social Connections: Not on file    Review of Systems: A 12 point ROS discussed and pertinent positives are indicated in the HPI above.  All other systems are negative.  Review of Systems  Constitutional:  Positive for activity change and fatigue. Negative for fever.  Respiratory:  Positive for shortness of breath.   Cardiovascular:  Positive for leg swelling. Negative for chest pain.  Neurological:  Positive for weakness.  Psychiatric/Behavioral:  Negative for behavioral problems and confusion.     Vital Signs: BP 134/75 (BP Location: Right Arm)   Pulse 94   Temp 98.4 F (36.9 C) (Oral)   Resp 16   Ht '5\' 6"'$  (1.676 m)   Wt 264 lb 15.9 oz (120.2 kg)   SpO2 100%   BMI 42.77 kg/m     Physical Exam Vitals reviewed.  HENT:     Mouth/Throat:     Mouth: Mucous membranes are moist.  Cardiovascular:     Rate and Rhythm: Normal rate and regular rhythm.     Heart sounds: Normal heart sounds.  Pulmonary:     Effort: Pulmonary effort is normal.     Breath sounds: Normal breath sounds.  Abdominal:     Palpations: Abdomen is soft.  Musculoskeletal:        General: Normal range of motion.  Skin:    General: Skin is warm.  Neurological:     Mental Status: She is alert and oriented to person, place, and time.  Psychiatric:        Behavior: Behavior normal.     Imaging: IR IMAGING GUIDED PORT INSERTION  Result Date: 06/20/2022 CLINICAL DATA:  History of metastatic appendiceal carcinoma, difficult IV access EXAM: TUNNELED PORT CATHETER PLACEMENT WITH ULTRASOUND AND FLUOROSCOPIC  GUIDANCE FLUOROSCOPY: Radiation Exposure Index (as provided by the fluoroscopic device): 866 mGy  air Kerma ANESTHESIA/SEDATION: Intravenous Fentanyl 512mg and Versed 12.'5mg'$  were administered as conscious sedation during continuous monitoring of the patient's level of consciousness and physiological / cardiorespiratory status by the radiology RN, with a total moderate sedation time of 332 minutes (performed immediately preceding iliofemoral thrombolysis dictated separately). TECHNIQUE: The procedure, risks, benefits, and alternatives were explained to the patient. Questions regarding the procedure were encouraged and answered. The patient understands and consents to the procedure. Patency of the left IJ vein was confirmed with ultrasound with image documentation. An appropriate skin site was determined. Skin site was marked. Region was prepped using maximum barrier technique including cap and mask, sterile gown, sterile gloves, large sterile sheet, and Chlorhexidine as cutaneous antisepsis. The region was infiltrated locally with 1% lidocaine. Under real-time ultrasound guidance, the left IJ vein was accessed with a 21 gauge micropuncture needle; the needle tip within the vein was confirmed with ultrasound image documentation. Needle was exchanged over a 018 guidewire for transitional dilator, and vascular measurement was performed. A small incision was made on the left anterior chest wall and a subcutaneous pocket fashioned. The power-injectable port was positioned and its catheter tunneled to the left IJ dermatotomy site. The transitional dilator was exchanged over an Amplatz wire for a peel-away sheath, through which the port catheter, which had been trimmed to the appropriate length, was advanced and positioned under fluoroscopy with its tip at the cavoatrial junction. Spot chest radiograph confirms good catheter position and no pneumothorax. The port was flushed per protocol. The pocket was closed with deep interrupted and subcuticular continuous 3-0 Monocryl sutures. The incisions were covered with  Dermabond then covered with a sterile dressing. The patient tolerated the procedure well. COMPLICATIONS: COMPLICATIONS None immediate IMPRESSION: Technically successful left IJ power-injectable port catheter placement. Ready for routine use. Electronically Signed   By: DLucrezia EuropeM.D.   On: 06/20/2022 17:12   VAS [place]NAL ARTERY DUPLEX  Result Date: 06/15/2022 ABDOMINAL VISCERAL Patient Name:  RLOUANA Barber Date of Exam:   06/15/2022 Medical Rec #: [id number]      Accession #:    [id number] of Birth: 104-05-1961      Patient Gender: F Patient Age:   89years [facility]      [facility] [place]NAL ARTERY DUPLEX Referring Phys: [id number] GBonnielee Haff-------------------------------------------------------------------------------- Indications: AKI High Risk Factors: Hypertension, Diabetes. Limitations: Air/bowel gas, obesity, patient discomfort and Multiple abdominal surgeries. Comparison Study: No prior studies. Performing Technologist: GOliver HumRVT  Examination Guidelines: A complete evaluation includes B-mode imaging, spectral Doppler, color Doppler, and power Doppler as needed of all accessible portions of each vessel. Bilateral testing is considered an integral part of a complete examination. Limited examinations for reoccurring indications may be performed as noted.  Duplex Findings: +--------------------+--------+--------+------+------------------+ Mesenteric          PSV cm/sEDV cm/sPlaque     Comments      +--------------------+--------+--------+------+------------------+ Aorta Mid              76      12                            +--------------------+--------+--------+------+------------------+ Celiac Artery Origin                      Unable to insonate +--------------------+--------+--------+------+------------------+ SMA Origin  Unable to insonate  +--------------------+--------+--------+------+------------------+    +------------------+--------+--------+-------+ Right Renal ArteryPSV cm/sEDV cm/sComment +------------------+--------+--------+-------+ Origin               98      19           +------------------+--------+--------+-------+ Proximal            103      30           +------------------+--------+--------+-------+ Mid                  55      22           +------------------+--------+--------+-------+ Distal               58      14           +------------------+--------+--------+-------+ +-----------------+--------+--------+-------+ Left Renal ArteryPSV cm/sEDV cm/sComment +-----------------+--------+--------+-------+ Origin              68      17           +-----------------+--------+--------+-------+ Proximal            68      20           +-----------------+--------+--------+-------+ Mid                 71      13           +-----------------+--------+--------+-------+ Distal              51      17           +-----------------+--------+--------+-------+ +------------+--------+--------+----+-----------+--------+--------+----+ Right KidneyPSV cm/sEDV cm/sRI  Left KidneyPSV cm/sEDV cm/sRI   +------------+--------+--------+----+-----------+--------+--------+----+ Upper Pole  43      14      0.68Upper Pole 56      18      0.69 +------------+--------+--------+----+-----------+--------+--------+----+ Mid         53      13      0.75Mid        54      20      0.64 +------------+--------+--------+----+-----------+--------+--------+----+ Lower Pole  56      22      0.60Lower Pole 46      15      0.68 +------------+--------+--------+----+-----------+--------+--------+----+ Hilar       53      20      0.62Hilar      91      28      0.69 +------------+--------+--------+----+-----------+--------+--------+----+ +------------------+----+------------------+----+  Right Kidney          Left Kidney            +------------------+----+------------------+----+ RAR                   RAR                    +------------------+----+------------------+----+ RAR (manual)      1.36RAR (manual)      1.2  +------------------+----+------------------+----+ Cortex                Cortex                 +------------------+----+------------------+----+ Cortex thickness      Corex thickness        +------------------+----+------------------+----+ Kidney length (cm)9.50Kidney length (cm)8.00 +------------------+----+------------------+----+  Summary: Renal:  Right: Normal size right kidney. Normal right Resisitive Index. No  evidence of right renal artery stenosis. RRV flow present. Left:  Normal size of left kidney. Normal left Resistive Index. No        evidence of left renal artery stenosis. LRV flow present.  *See table(s) above for measurements and observations.  Diagnosing physician: Harold Barban MD  Electronically signed by Harold Barban MD on 06/15/2022 at 8:31:54 PM.    Final    CT VENOGRAM ABD/PEL  Result Date: 06/15/2022 CLINICAL DATA:  IVC thrombosis EXAM: CT VENOGRAM ABDOMEN AND PELVIS TECHNIQUE: Multidetector CT imaging of the abdomen and pelvis was performed using the standard protocol during bolus administration of intravenous contrast. Multiplanar reconstructed images and MIPs were obtained and reviewed to evaluate the vascular anatomy. RADIATION DOSE REDUCTION: This exam was performed according to the departmental dose-optimization program which includes automated exposure control, adjustment of the mA and/or kV according to patient size and/or use of iterative reconstruction technique. CONTRAST:  166m OMNIPAQUE IOHEXOL 350 MG/ML SOLN COMPARISON:  CT abdomen pelvis 06/12/2022 FINDINGS: VASCULAR Abdominal aorta is normal in caliber. Hepatic, portal, and superior mesenteric veins are patent. Infrarenal IVC filter is present. The legs of  the filter extend outside the IVC. The filter is thrombosed with small amount of thrombus extending superior to the filter, approximately 2.5 cm superior to the apex of the filter. Main renal veins are patent. The right ovarian vein is dilated with multiple right retroperitoneal varices. Infrarenal IVC is mildly expanded and thrombosed. Bilateral common iliac, external iliac, common femoral, and visualized femoral veins are mildly expanded and thrombosed. Mild fat stranding seen adjacent to the thrombosed venous segments. NON-VASCULAR Lower chest: No acute abnormality. Hepatobiliary: No focal liver abnormality is seen. Status post cholecystectomy. No biliary dilatation. Pancreas: Unremarkable. No pancreatic ductal dilatation or surrounding inflammatory changes. Spleen: Status post splenectomy. Adrenals/Urinary Tract: Adrenal glands are normal. Interval development of mild right hydronephrosis with transition zone at the ureteropelvic junction. Kidneys and ureters otherwise unremarkable. Bladder is normal. Stomach/Bowel: Moderate distal esophageal wall thickening is new since recent examination from 06/13/2022 and is suspicious for esophagitis.Surgical clips and calcifications again seen along the lesser curvature of the stomach. Mild diffuse distention of the colon with liquid content, consistent with diarrhea. Anastomotic sutures noted at the rectum, cecum, and mid abdomen small bowel. Lymphatic: No enlarged abdominal or pelvic lymph nodes. Reproductive: Status post hysterectomy. No adnexal masses. Other: Mild subcutaneous and edema of the upper thighs bilaterally. Musculoskeletal: Posterior fusion changes present at L4-L5. Review of the MIP images confirms the above findings. IMPRESSION: 1. Thrombosed inferior vena cava and bilateral common iliac, external iliac, common femoral, and visualized femoral veins. 2. Thrombosed infrarenal IVC filter with small amount of thrombus extending superior to the filter apex.  Renal veins are patent. 3. Interval development of mild right hydronephrosis with transition at the ureteropelvic junction. 4. Interval development of moderate distal esophageal wall thickening suspicious for esophagitis. Electronically Signed   By: FMiachel RouxM.D.   On: 06/15/2022 12:38   DG Abd Portable 1V  Result Date: 06/15/2022 CLINICAL DATA:  61year old female with abdominal pain and distension. Appendiceal cancer with metastasis, Pseudomyxoma peritonei. EXAM: PORTABLE ABDOMEN - 1 VIEW COMPARISON:  CT Abdomen and Pelvis 06/13/2022, and earlier. FINDINGS: Portable AP supine views at 0624 hours. Increasing bowel gas since the CT on 06/13/2022 appears to be primarily within the colon. Gas is present in the sigmoid and rectum. No definite dilated small bowel at this time. IVC filter and lower lumbar spine fusion hardware redemonstrated. Stable cholecystectomy clips.  Chronic surgical clips in the left mid abdomen and right lower quadrant also. No definite pneumoperitoneum on these supine views. Lumbar spine degeneration with levoconvex scoliosis. No acute osseous abnormality identified. IMPRESSION: Diffusely increased large bowel gas since 06/13/2022 favors Ileus. Distal large bowel obstruction felt less likely, and no definite small bowel dilatation at this time. Electronically Signed   By: Genevie Ann M.D.   On: 06/15/2022 07:17   VAS [place] LOWER EXTREMITY VENOUS (DVT)  Result Date: 06/14/2022  Lower Venous DVT Study Patient Name:  Wendy Barber  Date of Exam:   06/14/2022 Medical Rec #: [id number]       Accession #:    [id number] Date of Birth: 22-Jun-1961       Patient Gender: F Patient Age:   74 years Exam Location:  [facility] Procedure:      VAS [place] LOWER EXTREMITY VENOUS (DVT) Referring Phys: Burney Gauze --------------------------------------------------------------------------------  Indications: Pain, and Swelling. Other Indications: Abnormality seen on CT of pelvis. Risk Factors: Hx of DVT &  PE (IVC filter in place) Recurrent appendiceal cancer. Anticoagulation: Patient on Xarelto until 2 weeks ago - taken off due to rectal bleeding. Comparison Study: Previous exam ([facility]) on 05/17/21 was negative for DVT &                   positive for SVT in proximal GSV. Performing Technologist: Rogelia Rohrer RVT, RDMS  Examination Guidelines: A complete evaluation includes B-mode imaging, spectral Doppler, color Doppler, and power Doppler as needed of all accessible portions of each vessel. Bilateral testing is considered an integral part of a complete examination. Limited examinations for reoccurring indications may be performed as noted. The reflux portion of the exam is performed with the patient in reverse Trendelenburg.  +---------+---------------+---------+-----------+----------+-------------------+ RIGHT    CompressibilityPhasicitySpontaneityPropertiesThrombus Aging      +---------+---------------+---------+-----------+----------+-------------------+ CFV      None           No       No                   Acute               +---------+---------------+---------+-----------+----------+-------------------+ SFJ      None                                         Acute               +---------+---------------+---------+-----------+----------+-------------------+ FV Prox  None           No       No                   Acute               +---------+---------------+---------+-----------+----------+-------------------+ FV Mid   None           No       No                   Acute               +---------+---------------+---------+-----------+----------+-------------------+ FV DistalNone           No       No                   Acute               +---------+---------------+---------+-----------+----------+-------------------+  PFV      None           No       No                   Acute               +---------+---------------+---------+-----------+----------+-------------------+  POP      None           No       No                   Acute               +---------+---------------+---------+-----------+----------+-------------------+ PTV                                                   Not well visualized +---------+---------------+---------+-----------+----------+-------------------+ PERO                                                  Not visualized      +---------+---------------+---------+-----------+----------+-------------------+ Gastroc  None           No       No                   Acute               +---------+---------------+---------+-----------+----------+-------------------+ GSV      None           No       No                   Acute               +---------+---------------+---------+-----------+----------+-------------------+ EIV      None           No       No                   Acute               +---------+---------------+---------+-----------+----------+-------------------+   Right Technical Findings: Not visualized segments include peroneal veins.  +---------+---------------+---------+-----------+----------+--------------+ LEFT     CompressibilityPhasicitySpontaneityPropertiesThrombus Aging +---------+---------------+---------+-----------+----------+--------------+ CFV      None           No       No                   Acute          +---------+---------------+---------+-----------+----------+--------------+ SFJ      None                                                        +---------+---------------+---------+-----------+----------+--------------+ FV Prox  None           No       No                   Acute          +---------+---------------+---------+-----------+----------+--------------+ FV Mid   None  No       No                   Acute          +---------+---------------+---------+-----------+----------+--------------+ FV DistalNone           No       No                    Acute          +---------+---------------+---------+-----------+----------+--------------+ PFV      None           No       No                   Acute          +---------+---------------+---------+-----------+----------+--------------+ POP      None           No       No                   Acute          +---------+---------------+---------+-----------+----------+--------------+ PTV      Full                                                        +---------+---------------+---------+-----------+----------+--------------+ PERO     Full                                                        +---------+---------------+---------+-----------+----------+--------------+ Gastroc  None           No       No                   Acute          +---------+---------------+---------+-----------+----------+--------------+ SSV      None           No       No                   Acute          +---------+---------------+---------+-----------+----------+--------------+ EIV      None           No       No                   Acute          +---------+---------------+---------+-----------+----------+--------------+     Summary: BILATERAL: -No evidence of popliteal cyst, bilaterally. -Imaging of bilateral common iliac veins attempted, however due to midline scarring from previous surgeries, poor ultrasound/tissue interface, and bowel gas unable to visualize. A portion of the distal IVC shows evidence on acute DVT. RIGHT: - Findings consistent with acute deep vein thrombosis involving the right common femoral vein, SF junction, right femoral vein, right proximal profunda vein, right popliteal vein, right gastrocnemius veins, and external iliac vein. - Findings consistent with acute superficial vein thrombosis involving the right great saphenous vein.  LEFT: - Findings consistent with acute deep vein thrombosis involving the left common femoral vein, SF junction, left femoral vein, left proximal  profunda vein, left  popliteal vein, and left gastrocnemius veins. - Findings consistent with acute superficial vein thrombosis involving the left small saphenous vein.  *See table(s) above for measurements and observations. Electronically signed by Servando Snare MD on 06/14/2022 at 2:11:58 PM.    Final    CT ABDOMEN PELVIS WO CONTRAST  Result Date: 06/13/2022 CLINICAL DATA:  Weakness and near-syncope. Bilateral hip pain. History of appendiceal cancer with metastasis. Pseudomyxoma peritonei. History of pulmonary emboli and deep venous thrombosis. * Tracking Code: BO * EXAM: CT ABDOMEN AND PELVIS WITHOUT CONTRAST TECHNIQUE: Multidetector CT imaging of the abdomen and pelvis was performed following the standard protocol without IV contrast. RADIATION DOSE REDUCTION: This exam was performed according to the departmental dose-optimization program which includes automated exposure control, adjustment of the mA and/or kV according to patient size and/or use of iterative reconstruction technique. COMPARISON:  03/23/2022 PET. 09/29/2021 contrast-enhanced chest abdomen and pelvic CT. FINDINGS: Lower chest: Mild bibasilar scarring. Normal heart size without pericardial or pleural effusion. Lad coronary artery calcification. Hepatobiliary: Advanced cirrhosis, as evidenced by an irregular hepatic capsule. Limited sensitivity for focal liver lesion due to lack of IV contrast. Cholecystectomy, without biliary ductal dilatation. Pancreas: Moderate pancreatic atrophy with fatty replacement in the head and uncinate process. Spleen: Splenectomy. Adrenals/Urinary Tract: Normal adrenal glands. No renal calculi or hydronephrosis. Decompressed urinary bladder, without bladder calculi. The ureters are somewhat challenging to follow, but no ureteric stones are seen. Stomach/Bowel: Normal stomach, without wall thickening. Surgical sutures at the rectosigmoid junction. Right hemicolectomy. Concurrent right pelvic enterotomy. Bowel is poorly  evaluated secondary to lack of oral or IV contrast. Anterior position of bowel loops is relatively similar and suggests adhesions. Vascular/Lymphatic: Aortic atherosclerosis. IVC filter is appropriately positioned, just below the renal veins. Edema is identified about both pelvic and groin venous systems, including on 72 and 73 of series 2. Subtle hypoattenuation within the left external iliac vein including on 66/2. No abdominopelvic adenopathy. Reproductive: Hysterectomy.  No adnexal mass. Other: No significant free fluid. No free intraperitoneal air. No convincing evidence of peritoneal metastasis. Musculoskeletal: L4-5 trans pedicle screw fixation. Moderate convex left lumbar spine curvature. IMPRESSION: 1. Perivascular edema within both groins and superficial pelvic sidewalls. Suspect bilateral pelvic and femoral venous thrombosis, suboptimally evaluated on this noncontrast exam. IVC filter in place. Ultrasound or dedicated CT venography versus MR venography could confirm. 2. Otherwise, low sensitivity exam secondary to lack of oral or IV contrast. 3. Extensive surgical changes throughout the abdomen, without specific evidence of metastatic disease. 4. Cirrhosis 5. Age advanced coronary artery atherosclerosis. Recommend assessment of coronary risk factors. 6.  Aortic Atherosclerosis (ICD10-I70.0). Electronically Signed   By: Abigail Miyamoto M.D.   On: 06/13/2022 16:38   CT L-SPINE NO CHARGE  Result Date: 06/13/2022 CLINICAL DATA:  Generalized weakness. Bilateral hip pain. History of metastatic disease EXAM: CT Lumbar Spine without contrast TECHNIQUE: Technique: Multiplanar CT images of the lumbar spine were reconstructed from contemporary CT of the Abdomen and Pelvis. RADIATION DOSE REDUCTION: This exam was performed according to the departmental dose-optimization program which includes automated exposure control, adjustment of the mA and/or kV according to patient size and/or use of iterative reconstruction  technique. CONTRAST:  None COMPARISON:  Abdominal CT 09/29/2021 FINDINGS: Segmentation: 5 lumbar type vertebrae based on the available coverage. L5 is incompletely segmented from the sacrum at the left transverse process Alignment: Levoscoliosis. Vertebrae: No evidence of acute fracture or aggressive bone lesion. Paraspinal and other soft tissues: The IVC and left more than right iliac veins  appear high-density and expanded with retroperitoneal edema. Partially covered liver shows lobulation from cirrhosis. Disc levels: T12- L1: Unremarkable. L1-L2: Mild disc narrowing L2-L3: Degenerative facet spurring and ligamentum flavum thickening. Disc space narrowing and bulging. Spinal stenosis is at least moderate. Moderate bilateral foraminal narrowing L3-L4: Disc collapse with endplate and facet spurring. At least moderate spinal stenosis. Biforaminal impingement L4-L5: PLIF with solid arthrodesis. Decompression with patent appearance of the canal. Mild to moderate left foraminal narrowing L5-S1:Mild spurring with incomplete segmentation. No bony impingement. IMPRESSION: 1. Dominant findings in the retroperitoneum where there is evidence of confluent thrombus in the iliac veins and IVC below a filter. 2. No acute finding in the lumbar spine. 3. Lumbar spine degeneration and scoliosis with moderate spinal stenosis at L2-3 and L3-4. Electronically Signed   By: Jorje Guild M.D.   On: 06/13/2022 16:25   EEG adult  Result Date: 06/01/2022 Lora Havens, MD     06/02/2022  8:23 AM Patient Name: Wendy Barber MRN: [id number] Epilepsy Attending: Lora Havens Referring Physician/Provider: Jani Gravel, MD Date: 06/01/2022 Duration: 22.09 mins Patient history: 61 year old female with syncope.  EEG to evaluate for seizure. Level of alertness: Awake AEDs during EEG study: None Technical aspects: This EEG study was done with scalp electrodes positioned according to the 10-20 International system of electrode placement.  Electrical activity was reviewed with band pass filter of 1-'70Hz'$ , sensitivity of 7 uV/mm, display speed of 62m/sec with a '60Hz'$  notched filter applied as appropriate. EEG data were recorded continuously and digitally stored.  Video monitoring was available and reviewed as appropriate. Description: The posterior dominant rhythm consists of 8 Hz activity of moderate voltage (25-35 uV) seen predominantly in posterior head regions, symmetric and reactive to eye opening and eye closing. Hyperventilation and photic stimulation were not performed.   IMPRESSION: This study is within normal limits. No seizures or epileptiform discharges were seen throughout the recording. A normal interictal EEG does not exclude nor support the diagnosis of epilepsy. PLora Havens  ECHOCARDIOGRAM COMPLETE  Result Date: 06/01/2022    ECHOCARDIOGRAM REPORT   Patient Name:   Wendy PIROZZIDate of Exam: 06/01/2022 Medical Rec #:  [id number]     Height:       66.0 in Accession #:    [id number]    Weight:       238.5 lb Date of Birth:  108/07/1961     BSA:          2.155 m Patient Age:    89years       BP:           116/79 mmHg Patient Gender: F              HR:           85 bpm. Exam [facility]:  [facility]cedure: 2D Echo, Cardiac Doppler and Color Doppler Indications:    R55 Syncope  History:        Patient has no prior history of Echocardiogram examinations.                 Risk Factors:Hypertension and Diabetes. Cancer of appendix                 metastatic to intra-abdominal lymph node ([place].  Sonographer:    BAlvino ChapelRCS Referring Phys: 3[place] 1. Left ventricular ejection fraction, by estimation, is 60 to 65%. The left ventricle has normal function. The left ventricle  has no regional wall motion abnormalities. Left ventricular diastolic parameters were normal.  2. Right ventricular systolic function is normal. The right ventricular size is normal. Tricuspid regurgitation signal is inadequate for assessing  PA pressure.  3. A small pericardial effusion is present. The pericardial effusion is circumferential. There is no evidence of cardiac tamponade.  4. The mitral valve is grossly normal. No evidence of mitral valve regurgitation. No evidence of mitral stenosis.  5. The aortic valve is tricuspid. Aortic valve regurgitation is not visualized. No aortic stenosis is present.  6. The inferior vena cava is normal in size with greater than 50% respiratory variability, suggesting right atrial pressure of 3 mmHg. FINDINGS  Left Ventricle: Left ventricular ejection fraction, by estimation, is 60 to 65%. The left ventricle has normal function. The left ventricle has no regional wall motion abnormalities. The left ventricular internal cavity size was normal in size. There is  no left ventricular hypertrophy. Left ventricular diastolic parameters were normal. Right Ventricle: The right ventricular size is normal. No increase in right ventricular wall thickness. Right ventricular systolic function is normal. Tricuspid regurgitation signal is inadequate for assessing PA pressure. Left Atrium: Left atrial size was normal in size. Right Atrium: Right atrial size was normal in size. Pericardium: A small pericardial effusion is present. The pericardial effusion is circumferential. There is no evidence of cardiac tamponade. Mitral Valve: The mitral valve is grossly normal. No evidence of mitral valve regurgitation. No evidence of mitral valve stenosis. Tricuspid Valve: The tricuspid valve is grossly normal. Tricuspid valve regurgitation is not demonstrated. No evidence of tricuspid stenosis. Aortic Valve: The aortic valve is tricuspid. Aortic valve regurgitation is not visualized. No aortic stenosis is present. Pulmonic Valve: The pulmonic valve was grossly normal. Pulmonic valve regurgitation is trivial. No evidence of pulmonic stenosis. Aorta: The aortic root is normal in size and structure. Venous: The inferior vena cava is normal in  size with greater than 50% respiratory variability, suggesting right atrial pressure of 3 mmHg. IAS/Shunts: The atrial septum is grossly normal.  LEFT VENTRICLE PLAX 2D LVIDd:         4.60 cm   Diastology LVIDs:         2.70 cm   LV e' medial:    5.87 cm/s LV PW:         1.00 cm   LV E/e' medial:  9.0 LV IVS:        1.00 cm   LV e' lateral:   9.79 cm/s LVOT diam:     2.40 cm   LV E/e' lateral: 5.4 LV SV:         67 LV SV Index:   31 LVOT Area:     4.52 cm  RIGHT VENTRICLE RV S prime:     14.80 cm/s TAPSE (M-mode): 2.0 cm LEFT ATRIUM             Index        RIGHT ATRIUM           Index LA diam:        3.70 cm 1.72 cm/m   RA Area:     14.90 cm LA Vol (A2C):   49.8 ml 23.11 ml/m  RA Volume:   38.00 ml  17.63 ml/m LA Vol (A4C):   54.0 ml 25.05 ml/m LA Biplane Vol: 51.9 ml 24.08 ml/m  AORTIC VALVE LVOT Vmax:   82.00 cm/s LVOT Vmean:  55.800 cm/s LVOT VTI:    0.149 m  AORTA  Ao Root diam: 3.40 cm MITRAL VALVE MV Area (PHT): 4.49 cm    SHUNTS MV Decel Time: 169 msec    Systemic VTI:  0.15 m MV E velocity: 52.90 cm/s  Systemic Diam: 2.40 cm MV A velocity: 65.50 cm/s MV E/A ratio:  0.81 Eleonore Chiquito MD Electronically signed by Eleonore Chiquito MD Signature Date/Time: 06/01/2022/10:45:58 AM    Final    US Carotid Bilateral  Result Date: 06/01/2022 CLINICAL DATA:  61 year old female with a history of syncope EXAM: BILATERAL CAROTID DUPLEX ULTRASOUND TECHNIQUE: Pearline Cables scale imaging, color Doppler and duplex ultrasound were performed of bilateral carotid and vertebral arteries in the neck. COMPARISON:  None Available. FINDINGS: Criteria: Quantification of carotid stenosis is based on velocity parameters that correlate the residual internal carotid diameter with NASCET-based stenosis levels, using the diameter of the distal internal carotid lumen as the denominator for stenosis measurement. The following velocity measurements were obtained: RIGHT ICA:  Systolic 77 cm/sec, Diastolic 23 cm/sec CCA:  65 cm/sec SYSTOLIC ICA/CCA  RATIO:  1.2 ECA:  17 cm/sec LEFT ICA:  Systolic 66 cm/sec, Diastolic 28 cm/sec CCA:  74 cm/sec SYSTOLIC ICA/CCA RATIO:  0.9 ECA:  72 cm/sec Right Brachial SBP: Not acquired Left Brachial SBP: Not acquired RIGHT CAROTID ARTERY: No significant calcified disease of the right common carotid artery. Intermediate waveform maintained. Homogeneous plaque without significant calcifications at the right carotid bifurcation. Low resistance waveform of the right ICA. No significant tortuosity. RIGHT VERTEBRAL ARTERY: Antegrade flow with low resistance waveform. LEFT CAROTID ARTERY: No significant calcified disease of the left common carotid artery. Intermediate waveform maintained. Homogeneous plaque at the left carotid bifurcation without significant calcifications. Low resistance waveform of the left ICA. LEFT VERTEBRAL ARTERY:  Antegrade flow with low resistance waveform. IMPRESSION: Color duplex indicates minimal homogeneous plaque, with no hemodynamically significant stenosis by duplex criteria in the extracranial cerebrovascular circulation. Signed, Dulcy Fanny. Nadene Rubins, RPVI [facility] Radiology Electronically Signed   By: Corrie Mckusick D.O.   On: 06/01/2022 09:46   CT HEAD WO CONTRAST (5MM)  Result Date: 06/01/2022 CLINICAL DATA:  Syncope/presyncope, cerebrovascular cause suspected syncope vs seizure EXAM: CT HEAD WITHOUT CONTRAST TECHNIQUE: Contiguous axial images were obtained from the base of the skull through the vertex without intravenous contrast. RADIATION DOSE REDUCTION: This exam was performed according to the departmental dose-optimization program which includes automated exposure control, adjustment of the mA and/or kV according to patient size and/or use of iterative reconstruction technique. COMPARISON:  None Available. FINDINGS: Brain: Partially calcified mass within the left posterior fossa along the tentorium at the torcula is unchanged from prior  examination, better characterized on prior examination as a partially calcified meningioma. No associated abnormal mass effect. No midline shift. No new intra or extra-axial mass lesion or fluid collection. No evidence of acute intracranial hemorrhage or infarct. Ventricular size is normal. Cerebellum is unremarkable. Vascular: No hyperdense vessel or unexpected calcification. Skull: Normal. Negative for fracture or focal lesion. Sinuses/Orbits: No acute finding. Other: There is fluid opacification of several inferior mastoid air cells bilaterally, left greater than right. No associated osseous erosion. Middle ear cavities are clear bilaterally. IMPRESSION: 1. No acute intracranial abnormality. No evidence of acute intracranial hemorrhage or infarct. 2. Stable partially calcified left posterior fossa meningioma. 3. Bilateral mastoid effusions, left greater than right. Electronically Signed   By: Fidela Salisbury M.D.   On: 06/01/2022 00:44   DG Chest Portable 1 View  Result Date: 05/31/2022 CLINICAL DATA:  Syncope,  hypotension EXAM: PORTABLE CHEST 1 VIEW COMPARISON:  None Available. FINDINGS: Heart and mediastinal contours are within normal limits. No focal opacities or effusions. No acute bony abnormality. IMPRESSION: No active disease. Electronically Signed   By: Rolm Baptise M.D.   On: 05/31/2022 23:13    Labs:  CBC: Recent Labs    06/18/22 0439 06/19/22 0206 06/20/22 0610 06/21/22 0314  WBC 11.9* 11.0* 10.5 13.2*  HGB 9.9* 9.4* 9.9* 7.6*  HCT 31.2* 29.3* 32.4* 24.4*  PLT 395 422* 404* 277    COAGS: Recent Labs    05/31/22 1945 06/13/22 1841  INR 1.1 1.3*  APTT  --  32    BMP: Recent Labs    06/17/22 0411 06/19/22 0206 06/20/22 0610 06/21/22 0314  NA 143 141 142 141  K 3.8 3.0* 3.6 3.9  CL 112* 108 111 111  CO2 25 21* 24 21*  GLUCOSE 110* 161* 150* 143*  BUN '20 13 11 13  '$ CALCIUM 9.2 8.8* 8.9 8.4*  CREATININE 1.08* 0.99 0.92 0.80  GFRNONAA 59* >60 >60 >60    LIVER  FUNCTION TESTS: Recent Labs    06/13/22 1240 06/14/22 0359 06/15/22 0431 06/17/22 0411  BILITOT <0.1* 0.5 0.4 0.6  AST '27 19 22 26  '$ ALT '16 14 15 17  '$ ALKPHOS 154* 119 130* 138*  PROT 8.3* 6.7 7.4 7.1  ALBUMIN 3.6 3.0* 3.2* 3.2*    TUMOR MARKERS: No results for input(s): "AFPTM", "CEA", "CA199", "CHROMGRNA" in the last 8760 hours.  Assessment and Plan:  Scheduled for Inferior vena cava filter retrieval in IR 8/30 She is aware of procedure benefits and risks including but not limited to Infection; vessel damage; inability to remove filter She is agreeable to proceed Consent signed and in chart  Thank you for this interesting consult.  I greatly enjoyed meeting Wendy Barber and look forward to participating in their care.  A copy of this report was sent to the requesting provider on this date.  Electronically Signed: Lavonia Drafts, PA-C 06/21/2022, 11:53 AM   I spent a total of 20 Minutes    in face to face in clinical consultation, greater than 50% of which was counseling/coordinating care for IVC retrieval

## 2022-06-22 ENCOUNTER — Other Ambulatory Visit (HOSPITAL_COMMUNITY): Payer: Self-pay

## 2022-06-22 ENCOUNTER — Encounter: Payer: Self-pay | Admitting: Hematology & Oncology

## 2022-06-22 ENCOUNTER — Inpatient Hospital Stay (HOSPITAL_COMMUNITY): Payer: Medicare Other

## 2022-06-22 ENCOUNTER — Other Ambulatory Visit: Payer: Medicare Other

## 2022-06-22 ENCOUNTER — Telehealth (HOSPITAL_COMMUNITY): Payer: Self-pay | Admitting: Pharmacy Technician

## 2022-06-22 DIAGNOSIS — I82412 Acute embolism and thrombosis of left femoral vein: Secondary | ICD-10-CM

## 2022-06-22 DIAGNOSIS — F419 Anxiety disorder, unspecified: Secondary | ICD-10-CM | POA: Diagnosis not present

## 2022-06-22 DIAGNOSIS — D509 Iron deficiency anemia, unspecified: Secondary | ICD-10-CM

## 2022-06-22 DIAGNOSIS — K922 Gastrointestinal hemorrhage, unspecified: Secondary | ICD-10-CM | POA: Diagnosis not present

## 2022-06-22 DIAGNOSIS — I10 Essential (primary) hypertension: Secondary | ICD-10-CM

## 2022-06-22 DIAGNOSIS — I824Y3 Acute embolism and thrombosis of unspecified deep veins of proximal lower extremity, bilateral: Secondary | ICD-10-CM | POA: Diagnosis not present

## 2022-06-22 DIAGNOSIS — C182 Malignant neoplasm of ascending colon: Secondary | ICD-10-CM | POA: Diagnosis not present

## 2022-06-22 DIAGNOSIS — C772 Secondary and unspecified malignant neoplasm of intra-abdominal lymph nodes: Secondary | ICD-10-CM | POA: Diagnosis not present

## 2022-06-22 DIAGNOSIS — N179 Acute kidney failure, unspecified: Secondary | ICD-10-CM | POA: Diagnosis not present

## 2022-06-22 DIAGNOSIS — E1169 Type 2 diabetes mellitus with other specified complication: Secondary | ICD-10-CM

## 2022-06-22 HISTORY — PX: IR RADIOLOGIST EVAL & MGMT: IMG5224

## 2022-06-22 LAB — URINALYSIS, ROUTINE W REFLEX MICROSCOPIC
Bilirubin Urine: NEGATIVE
Glucose, UA: NEGATIVE mg/dL
Hgb urine dipstick: NEGATIVE
Ketones, ur: NEGATIVE mg/dL
Nitrite: NEGATIVE
Protein, ur: NEGATIVE mg/dL
Specific Gravity, Urine: 1.015 (ref 1.005–1.030)
pH: 5 (ref 5.0–8.0)

## 2022-06-22 LAB — CBC
HCT: 24.4 % — ABNORMAL LOW (ref 36.0–46.0)
HCT: 22.4 % — ABNORMAL LOW (ref 36.0–46.0)
Hemoglobin: 7.6 g/dL — ABNORMAL LOW (ref 12.0–15.0)
Hemoglobin: 7 g/dL — ABNORMAL LOW (ref 12.0–15.0)
MCH: 29.5 pg (ref 26.0–34.0)
MCH: 29.4 pg (ref 26.0–34.0)
MCHC: 31.1 g/dL (ref 30.0–36.0)
MCHC: 31.3 g/dL (ref 30.0–36.0)
MCV: 94.6 fL (ref 80.0–100.0)
MCV: 94.1 fL (ref 80.0–100.0)
Platelets: 277 10*3/uL (ref 150–400)
Platelets: 226 10*3/uL (ref 150–400)
RBC: 2.58 MIL/uL — ABNORMAL LOW (ref 3.87–5.11)
RBC: 2.38 MIL/uL — ABNORMAL LOW (ref 3.87–5.11)
RDW: 19.7 % — ABNORMAL HIGH (ref 11.5–15.5)
RDW: 20 % — ABNORMAL HIGH (ref 11.5–15.5)
WBC: 13.2 10*3/uL — ABNORMAL HIGH (ref 4.0–10.5)
WBC: 11.6 10*3/uL — ABNORMAL HIGH (ref 4.0–10.5)
nRBC: 1.1 % — ABNORMAL HIGH (ref 0.0–0.2)
nRBC: 2.4 % — ABNORMAL HIGH (ref 0.0–0.2)

## 2022-06-22 LAB — COMPREHENSIVE METABOLIC PANEL
ALT: 11 U/L (ref 0–44)
AST: 18 U/L (ref 15–41)
Albumin: 2.5 g/dL — ABNORMAL LOW (ref 3.5–5.0)
Alkaline Phosphatase: 95 U/L (ref 38–126)
Anion gap: 6 (ref 5–15)
BUN: 13 mg/dL (ref 6–20)
CO2: 24 mmol/L (ref 22–32)
Calcium: 8.6 mg/dL — ABNORMAL LOW (ref 8.9–10.3)
Chloride: 109 mmol/L (ref 98–111)
Creatinine, Ser: 0.96 mg/dL (ref 0.44–1.00)
GFR, Estimated: >60 mL/min (ref >60)
Glucose, Bld: 135 mg/dL — ABNORMAL HIGH (ref 70–99)
Potassium: 3.3 mmol/L — ABNORMAL LOW (ref 3.5–5.1)
Sodium: 139 mmol/L (ref 135–145)
Total Bilirubin: 0.3 mg/dL (ref 0.3–1.2)
Total Protein: 5.6 g/dL — ABNORMAL LOW (ref 6.5–8.1)

## 2022-06-22 LAB — PREPARE RBC (CROSSMATCH)

## 2022-06-22 LAB — HEPARIN ANTI-XA: Heparin LMW: 1.2 IU/mL

## 2022-06-22 LAB — MAGNESIUM: Magnesium: 1.3 mg/dL — ABNORMAL LOW (ref 1.7–2.4)

## 2022-06-22 MED ORDER — FUROSEMIDE 10 MG/ML IJ SOLN
40.0000 mg | Freq: Once | INTRAMUSCULAR | Status: AC
  Administered 2022-06-22: 40 mg via INTRAVENOUS
  Filled 2022-06-22: qty 4

## 2022-06-22 MED ORDER — SENNOSIDES-DOCUSATE SODIUM 8.6-50 MG PO TABS: 1.0000 | ORAL_TABLET | Freq: Every day | ORAL | Status: DC

## 2022-06-22 MED ORDER — ENOXAPARIN SODIUM 100 MG/ML IJ SOSY
100.0000 mg | PREFILLED_SYRINGE | Freq: Two times a day (BID) | INTRAMUSCULAR | Status: DC
Start: 2022-06-22 — End: 2022-06-26
  Administered 2022-06-22 – 2022-06-26 (×8): 100 mg via SUBCUTANEOUS
  Filled 2022-06-22 (×9): qty 1

## 2022-06-22 MED ORDER — SODIUM CHLORIDE 0.9% FLUSH
10.0000 mL | Freq: Two times a day (BID) | INTRAVENOUS | Status: DC
  Administered 2022-06-22 – 2022-06-26 (×7): 10 mL

## 2022-06-22 MED ORDER — MAGNESIUM SULFATE 4 GM/100ML IV SOLN
4.0000 g | Freq: Once | INTRAVENOUS | Status: AC
  Administered 2022-06-22: 4 g via INTRAVENOUS
  Filled 2022-06-22: qty 100

## 2022-06-22 MED ORDER — MAGNESIUM SULFATE 2 GM/50ML IV SOLN
2.0000 g | Freq: Once | INTRAVENOUS | Status: AC
Start: 2022-06-22 — End: 2022-06-22
  Administered 2022-06-22: 2 g via INTRAVENOUS

## 2022-06-22 MED ORDER — POTASSIUM CHLORIDE CRYS ER 10 MEQ PO TBCR
40.0000 meq | EXTENDED_RELEASE_TABLET | Freq: Once | ORAL | Status: AC
  Administered 2022-06-22: 40 meq via ORAL
  Filled 2022-06-22: qty 4

## 2022-06-22 MED ORDER — SODIUM CHLORIDE 0.9% IV SOLUTION: Freq: Once | INTRAVENOUS | Status: AC

## 2022-06-22 NOTE — Progress Notes (Signed)
PT Cancellation Note  Patient Details Name: Wendy Barber MRN: [id number] DOB: 1960-11-22   Cancelled Treatment:    Reason Eval/Treat Not Completed: Medical issues which prohibited therapy. Per discussion with RN IVC filter retrieval abandoned. MD requesting PT hold until tomorrow.   Zenaida Niece 06/22/2022, 1:30 PM

## 2022-06-22 NOTE — Progress Notes (Signed)
Port-A-Cath placement PROGRESS NOTE    Wendy Barber  HQP:[id number] DOB: 1960/10/26 DOA: 06/13/2022 PCP: Wendy Agreste, MD    Chief Complaint  Patient presents with   Loss of Consciousness    Brief Narrative:  Wendy Barber was admitted to the hospital with the working diagnosis of bilateral lower extremity extensive DVT.   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 On her initial physical examination her blood pressure was 69/40, HR 88, 02 saturation 100%, lungs with no wheezing or rhonchi, heart with S1 and S2 present and rhythmic, abdomen tender to palpation in the epigastric area, no lower extremity edema.   Na 135, K 3,9, cl 103, bicarbonate 19 glucose 148 bun 42 cr 2,0 Wbc 15, hgb 8,8 plt 185  Urine analysis with SG 1,015, > 50 wbc, 11-20 rbc. 30 protein.   Abdominal radiograph with increased bowel gas suggesting ileus.   CT abdomen and pelvis with bilateral pelvic and femoral venous thrombosis, IVC filter in place. Positive cirrhosis.   Subsequent imaging showed bilateral DVT extending up to the IVC.  Started on heparin and admitted.  IR and Oncology consulted.  EKG 74 bpm, left axis deviation, normal intervals, sinus rhythm with ST depression and T wave inversions in V3 to V6.   CT venogram with thrombosed inferior venal cava and bilateral common iliac, external iliac, common femoral and visualized femoral veins.  Thrombosed infrarenal IVC filter with small amount of thrombus extending superior to the filter apex. Renal veins patent.  Mild right hydronephrosis.   Renal arterial US with no evidence of renal artery stenosis.   Patient was placed on heparin drip for  anticoagulation and IR was consulted.  08/26 transferred to [facility] for thrombectomy.  08/27 continue to have pain lower extremities with movement. 08/28 IVC and bilateral lower extremity venogram and mechanical thrombectomy. L IJ Port catheter placement.  08/29 transitioned to enoxaparin from IV heparin     Assessment & Plan:  Principal Problem:   DVT, bilateral lower limbs (HCC) Active Problems:   UTI (urinary tract infection)   AKI (acute kidney injury) ([place])   CAD (coronary artery disease)   DM2 (diabetes mellitus, type 2) (HCC)   HTN (hypertension)   Iron deficiency anemia due to chronic blood loss   Anxiety   Gastritis   Depression   Class 3 obesity (HCC)   Hypomagnesemia    Assessment and Plan: * DVT, bilateral lower limbs (HCC) Extensive deep vein thrombosis, bilateral lower extremities and inferior vena cava. IVC filter.  08/28 IVC and bilateral lower extremity venogram and mechanical thrombolysis L IJ port catheter placement.   Patient has been on heparin IV for anticoagulation, and transition subsequently to full dose enoxaparin.   Patient with persistent pain in her lower extremities and lower abdomen.  Continue long acting oxycodone 10 mg bid, continue with short acting oxycodone 5 mg prn, and IV hydromorphone PRN.  Scheduled acetaminophen. Patient had side effects to gabapentin in the past.   Avoid non steroidal anti inflammatory medications due to full anticoagulation.   -IR had planned to remove IVC filter this morning 06/22/2022 upper procedure canceled per IR at this time no plans to remove IVC filter.  UTI (urinary tract infection) Status post 5-day course of IV Rocephin.  -  Patient with some complaints of dysuria although improved and as such we will repeat UA with cultures and sensitivities.   -Hold off on antibiotics for now pending urine culture results.   AKI (acute kidney injury) ([place]) Hypokalemia,   Renal failure has resolved, her serum cr today is  0, 96 with K at 3,3 and serum bicarbonate at 24. -K-Dur 40 mEq p.o. x1. Continue to follow up renal function as needed.   CAD (coronary artery disease) Continue with antiplatelet therapy.   DM2 (diabetes mellitus, type 2) (HCC) Glucose has been well controlled, holding on insulin therapy. Fasting glucose is 139 mg/dl today.   HTN (hypertension) Systolic blood pressure 841 to 130 mmHg, plan to continue to hold on antihypertensive medications.   Iron deficiency anemia due to chronic blood loss -Status post IV iron.  -Hemoglobin currently at 7.0.  -Patient with no significant overt bleeding noted.  -Anemia in part likely due to procedures done.  -A unit of packed red blood cells ordered this morning per hematology oncology, thus patient will receive a total of 3 units packed red blood cells during this hospitalization.   -Hematology/oncology following.  Anxiety - Continue bupropion, BuSpar, citalopram  Gastritis Continue antiacid therapy with pantoprazole, famotidine and sucralfate. As needed antiemetics and continue with soft diet as tolerated.   Depression Continue with bupropion, citalopram and buspirone.   Class 3 obesity (HCC) BMI 40.1 Lifestyle modification Outpatient follow-up with PCP.  Hypomagnesemia - Magnesium at 1.3. -Magnesium sulfate 6 g IV x1. -Repeat labs in the AM.         DVT prophylaxis: Full dose Lovenox Code Status: Full Family Communication: Updated patient.  No family at bedside. Disposition: Likely home when clinically improved, cleared by hematology/oncology and IR.  Status is: Inpatient Remains inpatient appropriate because: Severity of illness   Consultants:  IR: Dr. [name] 06/21/2022 Hematology/oncology: Dr. [name] 06/14/2022  Procedures:  CT abdomen and pelvis 06/13/2022 Abdominal films 06/15/2022 Bilateral lower extremity Dopplers 06/14/2022 CT L-spine 06/13/2022 CT venogram abdomen and pelvis 06/15/2022 Tunneled Port-A-Cath  placement with ultrasound and fluoroscopic guidance per IR 06/20/2022, Dr. Jarvis Newcomer Ultrasound renal artery duplex 06/15/2022 IVC and bilateral lower extremity venogram and mechanical thrombolysis/left IJ Port-A-Cath placement by IR, Dr. Vernard Gambles 06/20/2022 Transfusion 3 units packed red blood cells  Antimicrobials:  Anti-infectives (From admission, onward)    Start     Dose/Rate Route Frequency Ordered Stop   06/14/22 1200  cefTRIAXone (ROCEPHIN) 1 g in sodium chloride 0.9 % 100 mL IVPB  Status:  Discontinued        1 g 200 mL/hr over 30 Minutes Intravenous Every 24 hours 06/14/22 1048 06/18/22 1428         Subjective: Patient laying in bed.  Complaining of lower abdominal pain.  Complaining of heartburn.  No chest pain.  No shortness of breath.  Procedure canceled today per IR and no longer going to remove IVC filter.  Patient complaining of urinary frequency and some dysuria.  Patient having bowel movements.  Feels PPI and sucralfate helping with esophagitis.  Currently receiving transfusion of packed red blood cells.  Objective: Vitals:   06/22/22 1313 06/22/22 1437 06/22/22 1643 06/22/22 2006  BP: (!) 143/81  135/79 (!) 138/90  Pulse: 89 83 88 84  Resp: '16 17 18 18  '$ Temp: 98.4 F (36.9 C) 98.3 F (36.8 C) 98.3 F (36.8 C) 98.5 F (36.9 C)  TempSrc: Oral Oral Oral Oral  SpO2: 96% 99% 100% 97%  Weight:  Height:        Intake/Output Summary (Last 24 hours) at 06/22/2022 2116 Last data filed at 06/22/2022 2007 Gross per 24 hour  Intake 337.5 ml  Output 0 ml  Net 337.5 ml   Filed Weights   06/17/22 0427 06/18/22 0449 06/20/22 0500  Weight: 114.7 kg 114.6 kg 120.2 kg    Examination:  General exam: Appears calm and comfortable  Respiratory system: Clear to auscultation.  No wheezes, no crackles, no rhonchi.  Respiratory effort normal. Cardiovascular system: S1 & S2 heard, RRR. No JVD, murmurs, rubs, gallops or clicks. No pedal edema. Gastrointestinal system: Abdomen is  nondistended, soft and some tenderness to palpation in the epigastric region and suprapubic region.  Positive bowel sounds. Central nervous system: Alert and oriented. No focal neurological deficits. Extremities: Symmetric 5 x 5 power. Skin: No rashes, lesions or ulcers Psychiatry: Judgement and insight appear normal. Mood & affect appropriate.     Data Reviewed:   CBC: Recent Labs  Lab 06/17/22 0411 06/18/22 0439 06/19/22 0206 06/20/22 0610 06/21/22 0314 06/22/22 0556  WBC 10.8* 11.9* 11.0* 10.5 13.2* 11.6*  NEUTROABS 6.0  --   --   --   --   --   HGB 10.1* 9.9* 9.4* 9.9* 7.6* 7.0*  HCT 33.3* 31.2* 29.3* 32.4* 24.4* 22.4*  MCV 94.1 92.3 91.8 94.5 94.6 94.1  PLT 409* 395 422* 404* 277 759    Basic Metabolic Panel: Recent Labs  Lab 06/17/22 0411 06/19/22 0206 06/20/22 0610 06/21/22 0314 06/22/22 0556  NA 143 141 142 141 139  K 3.8 3.0* 3.6 3.9 3.3*  CL 112* 108 111 111 109  CO2 25 21* 24 21* 24  GLUCOSE 110* 161* 150* 143* 135*  BUN '20 13 11 13 13  '$ CREATININE 1.08* 0.99 0.92 0.80 0.96  CALCIUM 9.2 8.8* 8.9 8.4* 8.6*  MG  --   --   --   --  1.3*    GFR: Estimated Creatinine Clearance: 83.4 mL/min (by C-G formula based on SCr of 0.96 mg/dL).  Liver Function Tests: Recent Labs  Lab 06/17/22 0411 06/22/22 0556  AST 26 18  ALT 17 11  ALKPHOS 138* 95  BILITOT 0.6 0.3  PROT 7.1 5.6*  ALBUMIN 3.2* 2.5*    CBG: Recent Labs  Lab 06/17/22 1943 06/18/22 0616 06/19/22 0617 06/20/22 0614 06/21/22 0640  GLUCAP 92 87 138* 139* 128*     Recent Results (from the past 240 hour(s))  Urine Culture     Status: Abnormal   Collection Time: 06/14/22 10:48 AM   Specimen: Urine, Clean Catch  Result Value Ref Range Status   Specimen Description   Final    URINE, CLEAN CATCH Performed at [facility], [place] [address] [place], [place] [address]    Special Requests   Final    NONE Performed at [facility], [place] [address] [place], [place] [address]    Culture >=100,000 COLONIES/mL ESCHERICHIA COLI (A)  Final   Report Status 06/16/2022 FINAL  Final   Organism ID, Bacteria ESCHERICHIA COLI (A)  Final      Susceptibility   Escherichia coli - MIC*    AMPICILLIN 4 SENSITIVE Sensitive     CEFAZOLIN <=4 SENSITIVE Sensitive     CEFEPIME <=0.12 SENSITIVE Sensitive     CEFTRIAXONE <=0.25 SENSITIVE Sensitive     CIPROFLOXACIN <=0.25 SENSITIVE Sensitive     GENTAMICIN <=1 SENSITIVE Sensitive     IMIPENEM <=0.25 SENSITIVE Sensitive  NITROFURANTOIN 32 SENSITIVE Sensitive     TRIMETH/SULFA <=20 SENSITIVE Sensitive     AMPICILLIN/SULBACTAM <=2 SENSITIVE Sensitive     PIP/TAZO <=4 SENSITIVE Sensitive     * >=100,000 COLONIES/mL ESCHERICHIA COLI         Radiology Studies: No results found.      Scheduled Meds:  buPROPion  150 mg Oral Daily   busPIRone  15 mg Oral TID   Chlorhexidine Gluconate Cloth  6 each Topical Daily   citalopram  40 mg Oral Daily   dicyclomine  20 mg Oral TID AC & HS   diphenoxylate-atropine  2 tablet Oral QID   enoxaparin (LOVENOX) injection  100 mg Subcutaneous BID   famotidine  20 mg Oral BID   meclizine  25 mg Oral TID   multivitamin with minerals  1 tablet Oral Daily   oxyCODONE  10 mg Oral Q12H   pantoprazole  40 mg Oral BID AC   polyethylene glycol  17 g Oral Daily   senna-docusate  1 tablet Oral QHS   sodium chloride flush  10-40 mL Intracatheter Q12H   sodium chloride flush  3 mL Intravenous Q12H   sucralfate  1 g Oral Q6H   Continuous Infusions:  sodium chloride 10 mL/hr at 06/17/22 2348   promethazine (PHENERGAN) injection (IM or IVPB) 6.25 mg (06/19/22 2330)     LOS: 8 days    Time spent: 40 minutes    Irine Seal, MD [facility]   To contact the attending provider between 7A-7P or the covering provider during after hours 7P-7A, please log into the web site [email or web address] and access using universal [place] password for that web site. If  you do not have the password, please call the hospital operator.  06/22/2022, 9:16 PM

## 2022-06-22 NOTE — TOC Benefit Eligibility Note (Signed)
Patient Teacher, English as a foreign language completed.    The patient is currently admitted and upon discharge could be taking enoxaparin (Lovenox) 120 mg/0.8 ml inj.  The current 30 day co-pay is $14.00.   The patient is insured through [place] ([place] only one of [employer] that take Tricare)    Lyndel Safe, [place] Patient Advocate Specialist [place] Patient Advocate Team Direct Number: [phone]  Fax: [phone]

## 2022-06-22 NOTE — Assessment & Plan Note (Addendum)
-   Magnesium at 1.8 from 2.2 from 1.3 after magnesium sulfate 6 g IV given on 06/22/2022. -Repeat labs in the AM.

## 2022-06-22 NOTE — Progress Notes (Signed)
ANTICOAGULATION CONSULT NOTE - Follow Up Consult  Pharmacy Consult for Enoxaparin Indication:  VTE  Allergies  Allergen Reactions   Penicillins Shortness Of Breath    Other reaction(s): Irregular Heart Rate, Other (See Comments) Rapid heartrate    Alprazolam Hives and Other (See Comments)    Hard to arouse, unresponsiveness   Ativan [Lorazepam] Other (See Comments)    Note: tolerates midazolam fine Face & Throat Swelling.   Corticosteroids Other (See Comments)    Other reaction(s): Other (see comments) Psychotic behaviour     Erythromycin     Other reaction(s): Other (See Comments) Severe stomach pain    Prednisone Other (See Comments)    Anxiety & Nervous Breakdown.   Savella  [Milnacipran]    Prednisolone Anxiety    Patient Measurements: Height: '5\' 6"'$  (167.6 cm) Weight: 120.2 kg (264 lb 15.9 oz) IBW/kg (Calculated) : 59.3 Heparin Dosing Weight: 84.7 kg  Vital Signs: Temp: 98.4 F (36.9 C) (08/30 1313) Temp Source: Oral (08/30 1313) BP: 143/81 (08/30 1313) Pulse Rate: 89 (08/30 1313)  Labs: Recent Labs    06/20/22 0610 06/21/22 0314 06/21/22 0843 06/22/22 0556 06/22/22 1406  HGB 9.9* 7.6*  --  7.0*  --   HCT 32.4* 24.4*  --  22.4*  --   PLT 404* 277  --  226  --   HEPARINUNFRC 0.34 0.83* 1.07*  --   --   HEPRLOWMOCWT  --   --   --   --  1.20  CREATININE 0.92 0.80  --  0.96  --      Estimated Creatinine Clearance: 83.4 mL/min (by C-G formula based on SCr of 0.96 mg/dL).   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 Pt started on IV heparin for extensive DVTs and now being transition to enoxaparin per heme oncs recommendations.  Changed to enoxaparin on 8/29 - last dose on 8/30'@0939'$ . Level came back at 1.2 on 8/30'@1406'$ . Hgb 7 - required PRBC 8/30, plt WNL.  Goal of Therapy:  Anti-Xa level 0.6-1 units/ml 4hrs after LMWH dose  given Monitor platelets by anticoagulation protocol: Yes   Plan:  Stop heparin infusion Reduce enoxaparin to 100 mg Q12H SQ  Daily CBC, Continue to monitor H&H    Antonietta Jewel, PharmD, BCCCP Clinical Pharmacist  Phone: [phone] 06/22/2022 2:43 PM  Please check AMION for all [place] phone numbers After 10:00 PM, call [place] [phone]

## 2022-06-22 NOTE — Progress Notes (Signed)
OT Cancellation Note  Patient Details Name: Wendy Barber MRN: [id number] DOB: [birth year]   Cancelled Treatment:    Reason Eval/Treat Not Completed: Other (comment);Medical issues which prohibited therapy (Pt is awaiting blood transfusion this morning secondary to low hemaglobin (7.0) anemia & is also awaiting medical procedure later this morning per chart review. Will hold OT Eval until next available date.)  Almyra Deforest, OTR/L 06/22/2022, 7:50 AM

## 2022-06-22 NOTE — Telephone Encounter (Signed)
Pharmacy Patient Advocate Encounter  Insurance verification completed.    The patient is insured through [place] ([place] only one of [employer] that take Tricare)    The patient is currently admitted and ran test claims for the following: enoxaparin (Lovenox) .  Copays and coinsurance results were relayed to Inpatient clinical team.

## 2022-06-22 NOTE — Progress Notes (Signed)
Wendy Barber did have a thrombectomy done.  This was done 2 days ago.  She had a Port-A-Cath placed.  She now is going to have the IVC filter removed today.  As always, IR has done a fantastic job.  She still has some leg swelling.  This might be from her anemia.  Hemoglobin dropped to 7.6 yesterday.  I think we will probably going to have to give her a unit of blood.  This may help get out some of the fluid in her legs.  She is having some abdominal pain.  I know this might be somehow related to her being anemic.  She is on Lovenox.  I do not think she is bleeding.  She has had no cough.  There is no chest wall pain.  She did have a E. coli urinary tract infection.  She was on antibiotics for this.  It would be nice to recheck this.  Her hemoglobin this morning is 7.0.  Again, I think this might be more reflective of her having the procedures.  I do not think she is bleeding.  Again, we will have to give her a unit of blood.  She has had no fever.  Her vital signs are temperature 98.6.  Pulse 88.  Blood pressure 127/81.  Her lungs sound clear bilaterally.  Cardiac exam regular rate and rhythm.  She has no murmurs.  Abdomen is soft.  Bowel sounds are present.  There is a little bit of tenderness to palpation in the lower abdomen.  There is no obvious fluid wave.  There is no palpable liver or spleen tip.  Extremities does show some edema in her legs.  She probably has about 2+ edema in her legs.  Her legs do not feel as tight.  Neurological exam is none focal.  Thankfully, she has been able to have the clots in her legs removed.  Again, she will go for the IVC filter removal today.  We will set her up with some blood.  I will give her a unit of blood.  I know that she is getting incredible care from all the staff upon 4 E.  [name], MD  Romans 10:9

## 2022-06-22 NOTE — Progress Notes (Signed)
Nutrition Follow-up  DOCUMENTATION CODES:   Obesity unspecified  INTERVENTION:  Encourage adequate PO intake of regular diet Snacks TID between meals to optimize nutritional intake Check micronutrient labs: zinc, folate, Vitamin B12, thiamine, CRP   NUTRITION DIAGNOSIS:   Increased nutrient needs related to chronic illness, cancer and cancer related treatments as evidenced by estimated needs.  Ongoing  GOAL:   Patient will meet greater than or equal to 90% of their needs  Progressing- addressing via meals and snacks  MONITOR:   Diet advancement, PO intake, Labs, Weight trends  REASON FOR ASSESSMENT:   Malnutrition Screening Tool    ASSESSMENT:   61 y.o. 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 She presented to the ED due to generalized weakness and multiple near syncopal episodes at home. While in the ED she had a syncopal episode which lasted 30-45 seconds. She occasional experiences BLE edema. Patient experiences chronic abdominal pain and diarrhea x6-8 times daily. She was admitted d/t syncope.  S/o thrombectomy 2 days ago. Port-a-cath placed for IV access. Plan was for IVC filter retrieval today however pt states that they did not perform as she continues with clots in her legs and wanted to wait for now to remove.   Pt sitting up in bed at time of visit. She states that her PO intake has been up and down recently. She has experienced intermittent nausea which she takes medication for. She reports that during admission she has had a burning sensation inf her throat when swallowing solids and fluids at all temperatures. She spoke with her Oncologist and MD about this and states that they determined this to be more esophageal rather  than dysphagia. She was started on carafate which has helped. Throughout admission, she has been alternating between NPO and a regular diet because of this she has missed a few meals.   Pt reports having short bowel syndrome. Per review of chart, pt noted to have had major resection of small bowel, proximal jejunum, ascending colon and partial cystectomy in February 2022. Following this procedure, she had a poor appetite, was not eating much and reported having clumps of hair loss which she found was r/t malnutrition.   She has had ongoing significant diarrhea since her resection. She experiences diarrhea after eating most foods. No significant triggers. She does state that the only fluids she drinks is water.  At home she takes a MVI but does not want to take one during admission as she is afraid of adding something else that may upset her stomach. We discussed checking micronutrient labs to assess for any possible deficiencies r/t her short bowel syndrome and ongoing diarrhea. She is in agreement. She also reports being evaluated for "burning mouth syndrome" as she has had this numbness/burning sensation on her tongue and roof of her mouth.   Given that she does not like nutrition supplements, she is agreeable to the addition of snacks between meals.   Pt reports having had a 100 lb weight loss last year after her surgery. Reviewed weight history. Unable to confirm this weight loss. Her weight appear to have been increasing within the last year however noted to have significant edema.   Edema: deep pitting BLE  Medications: bentyl, lomotil, pepcid, lasix, MVI, miralax, senna, carafate IV drips: Mg sulfate  Labs: potassium  3.3 (repleted), Mg 1.3 (repleted), CBG's 128-139 x24 hours  I/O's: +11.4L since admission  NUTRITION - FOCUSED PHYSICAL EXAM:  Flowsheet Row Most Recent Value  Orbital Region No depletion  Upper Arm Region No depletion  Thoracic and Lumbar Region No depletion  Buccal  Region No depletion  Temple Region No depletion  Clavicle Bone Region No depletion  Clavicle and Acromion Bone Region No depletion  Scapular Bone Region No depletion  Dorsal Hand No depletion  Patellar Region No depletion  Anterior Thigh Region No depletion  Posterior Calf Region No depletion  Edema (RD Assessment) None  Hair Reviewed  Eyes Reviewed  Mouth Reviewed  Skin Reviewed  Nails Reviewed       Diet Order:   Diet Order             Diet regular Room service appropriate? Yes with Assist; Fluid consistency: Thin  Diet effective now                   EDUCATION NEEDS:   No education needs have been identified at this time  Skin:  Skin Assessment: Reviewed RN Assessment (puncture L/R knee, R neck)  Last BM:  8/30  Height:   Ht Readings from Last 1 Encounters:  06/13/22 '5\' 6"'$  (1.676 m)    Weight:   Wt Readings from Last 1 Encounters:  06/20/22 120.2 kg    Ideal Body Weight:  59.1 kg  BMI:  Body mass index is 42.77 kg/m.  Estimated Nutritional Needs:   Kcal:  2100-2300 kcal  Protein:  105-120 grams  Fluid:  >/= 2.3 L/day  Clayborne Dana, RDN, LDN Clinical Nutrition

## 2022-06-23 ENCOUNTER — Ambulatory Visit: Payer: Medicare Other | Admitting: Family Medicine

## 2022-06-23 DIAGNOSIS — I824Y3 Acute embolism and thrombosis of unspecified deep veins of proximal lower extremity, bilateral: Secondary | ICD-10-CM | POA: Diagnosis not present

## 2022-06-23 DIAGNOSIS — F419 Anxiety disorder, unspecified: Secondary | ICD-10-CM | POA: Diagnosis not present

## 2022-06-23 DIAGNOSIS — I82412 Acute embolism and thrombosis of left femoral vein: Secondary | ICD-10-CM | POA: Diagnosis not present

## 2022-06-23 DIAGNOSIS — R251 Tremor, unspecified: Secondary | ICD-10-CM

## 2022-06-23 DIAGNOSIS — N179 Acute kidney failure, unspecified: Secondary | ICD-10-CM | POA: Diagnosis not present

## 2022-06-23 LAB — C-REACTIVE PROTEIN: CRP: 2.3 mg/dL — ABNORMAL HIGH (ref <1.0)

## 2022-06-23 LAB — BASIC METABOLIC PANEL
Anion gap: 6 (ref 5–15)
BUN: 8 mg/dL (ref 6–20)
CO2: 25 mmol/L (ref 22–32)
Calcium: 8.6 mg/dL — ABNORMAL LOW (ref 8.9–10.3)
Chloride: 111 mmol/L (ref 98–111)
Creatinine, Ser: 0.98 mg/dL (ref 0.44–1.00)
GFR, Estimated: >60 mL/min (ref >60)
Glucose, Bld: 119 mg/dL — ABNORMAL HIGH (ref 70–99)
Potassium: 3.4 mmol/L — ABNORMAL LOW (ref 3.5–5.1)
Sodium: 142 mmol/L (ref 135–145)

## 2022-06-23 LAB — CBC
HCT: 24.4 % — ABNORMAL LOW (ref 36.0–46.0)
Hemoglobin: 7.8 g/dL — ABNORMAL LOW (ref 12.0–15.0)
MCH: 30 pg (ref 26.0–34.0)
MCHC: 32 g/dL (ref 30.0–36.0)
MCV: 93.8 fL (ref 80.0–100.0)
Platelets: 218 10*3/uL (ref 150–400)
RBC: 2.6 MIL/uL — ABNORMAL LOW (ref 3.87–5.11)
RDW: 19.8 % — ABNORMAL HIGH (ref 11.5–15.5)
WBC: 9.1 10*3/uL (ref 4.0–10.5)
nRBC: 5.9 % — ABNORMAL HIGH (ref 0.0–0.2)

## 2022-06-23 LAB — GLUCOSE, CAPILLARY: Glucose-Capillary: 141 mg/dL — ABNORMAL HIGH (ref 70–99)

## 2022-06-23 LAB — VITAMIN B12: Vitamin B-12: 394 pg/mL (ref 180–914)

## 2022-06-23 LAB — MAGNESIUM: Magnesium: 2.2 mg/dL (ref 1.7–2.4)

## 2022-06-23 LAB — FOLATE: Folate: 21.9 ng/mL (ref >5.9)

## 2022-06-23 MED ORDER — POTASSIUM CHLORIDE CRYS ER 10 MEQ PO TBCR
40.0000 meq | EXTENDED_RELEASE_TABLET | Freq: Once | ORAL | Status: AC
  Filled 2022-06-23: qty 4

## 2022-06-23 MED ORDER — LOPERAMIDE HCL 2 MG PO CAPS
4.0000 mg | ORAL_CAPSULE | Freq: Once | ORAL | Status: DC
  Filled 2022-06-23: qty 2

## 2022-06-23 MED ORDER — PROPRANOLOL HCL 20 MG PO TABS
20.0000 mg | ORAL_TABLET | Freq: Two times a day (BID) | ORAL | Status: DC
  Administered 2022-06-24 – 2022-06-26 (×5): 20 mg via ORAL
  Filled 2022-06-23 (×8): qty 1

## 2022-06-23 MED ORDER — FUROSEMIDE 10 MG/ML IJ SOLN
40.0000 mg | Freq: Once | INTRAMUSCULAR | Status: AC
  Administered 2022-06-23: 40 mg via INTRAVENOUS
  Filled 2022-06-23: qty 4

## 2022-06-23 MED ORDER — POTASSIUM CHLORIDE CRYS ER 10 MEQ PO TBCR
40.0000 meq | EXTENDED_RELEASE_TABLET | Freq: Once | ORAL | Status: AC
  Administered 2022-06-23: 40 meq via ORAL
  Filled 2022-06-23: qty 4

## 2022-06-23 NOTE — Progress Notes (Addendum)
   06/23/22 1400  Mobility  Activity Ambulated with assistance in hallway  Level of Assistance Contact guard assist, steadying assist  Assistive Device Front wheel walker  Distance Ambulated (ft) 40 ft  Activity Response Tolerated well  $Mobility charge 1 Mobility   Mobility Specialist Progress Note  Received pt in bed and agreeable. Mobility limited d/t to leg pain. Returned to room w/o fault. Left in bed w/ call bell in reach and all needs met.   Wendy Barber Mobility Specialist

## 2022-06-23 NOTE — Progress Notes (Signed)
Port-A-Cath placement PROGRESS NOTE    Wendy Barber  IEP:[id number] DOB: 17-Apr-1961 DOA: 06/13/2022 PCP: [name], MD    Chief Complaint  Patient presents with   Loss of Consciousness    Brief Narrative:  Wendy Barber was admitted to the hospital with the working diagnosis of bilateral lower extremity extensive DVT.   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 On her initial physical examination her blood pressure was 69/40, HR 88, 02 saturation 100%, lungs with no wheezing or rhonchi, heart with S1 and S2 present and rhythmic, abdomen tender to palpation in the epigastric area, no lower extremity edema.   Na 135, K 3,9, cl 103, bicarbonate 19 glucose 148 bun 42 cr 2,0 Wbc 15, hgb 8,8 plt 185  Urine analysis with SG 1,015, > 50 wbc, 11-20 rbc. 30 protein.   Abdominal radiograph with increased bowel gas suggesting ileus.   CT abdomen and pelvis with bilateral pelvic and femoral venous thrombosis, IVC filter in place. Positive cirrhosis.   Subsequent imaging showed bilateral DVT extending up to the IVC.  Started on heparin and admitted.  IR and Oncology consulted.  EKG 74 bpm, left axis deviation, normal intervals, sinus rhythm with ST depression and T wave inversions in V3 to V6.   CT venogram with thrombosed inferior venal cava and bilateral common iliac, external iliac, common femoral and visualized femoral veins.  Thrombosed infrarenal IVC filter with small amount of thrombus extending superior to the filter apex. Renal veins patent.  Mild right hydronephrosis.   Renal arterial US with no evidence of renal artery stenosis.   Patient was placed on heparin drip for  anticoagulation and IR was consulted.  08/26 transferred to [facility] for thrombectomy.  08/27 continue to have pain lower extremities with movement. 08/28 IVC and bilateral lower extremity venogram and mechanical thrombectomy. L IJ Port catheter placement.  08/29 transitioned to enoxaparin from IV heparin     Assessment & Plan:  Principal Problem:   DVT, bilateral lower limbs (HCC) Active Problems:   UTI (urinary tract infection)   AKI (acute kidney injury) ([place])   CAD (coronary artery disease)   DM2 (diabetes mellitus, type 2) (HCC)   HTN (hypertension)   Iron deficiency anemia due to chronic blood loss   Anxiety   Gastritis   Depression   Class 3 obesity (HCC)   Hypomagnesemia   Iron deficiency anemia   Occasional tremors: Essential tremors    Assessment and Plan: * DVT, bilateral lower limbs (HCC) Extensive deep vein thrombosis, bilateral lower extremities and inferior vena cava. IVC filter.  08/28 IVC and bilateral lower extremity venogram and mechanical thrombolysis L IJ port catheter placement.   Patient was on heparin IV for anticoagulation, and transitioned subsequently to full dose enoxaparin.   Patient with persistent pain in her lower extremities and lower abdomen.  Continue long acting oxycodone 10 mg bid, continue with short acting oxycodone 5 mg prn, and IV hydromorphone PRN.  Scheduled acetaminophen. Patient had side effects to gabapentin in the past.   Avoid non steroidal anti inflammatory medications due to full anticoagulation.   -IR had planned to remove IVC filter on 06/22/2022 upper procedure canceled per IR at this time no plans to remove IVC filter at this time  from this hospitalization.  UTI (urinary tract infection) Status post 5-day course of IV Rocephin.   -Patient with some complaints of dysuria although improved and as such repeat urinalysis was done with trace leukocytes, nitrite negative, 6-10 WBCs.   -Urine cultures pending.  -Hold off on  antibiotics for now pending urine culture results.   AKI (acute kidney injury) ([place]) Hypokalemia,   Renal failure has resolved, her serum cr today is 0, 98 with K at 3,4 and serum bicarbonate at 25. -K-Dur 40 mEq p.o. x1. Continue to follow up renal function as needed.   CAD (coronary artery disease) Continue with antiplatelet therapy.   DM2 (diabetes mellitus, type 2) (HCC) Glucose has been well controlled, holding on insulin therapy. Fasting glucose is 119 mg/dl today.   HTN (hypertension) BP controlled with systolic blood pressures ranging from 119-130s.   -We will resume patient's propranolol for her essential tremors.   Iron deficiency anemia due to chronic blood loss -Status post IV iron.  -Hemoglobin currently at 7.8 this morning. -Patient with no significant overt bleeding noted.  -Anemia in part likely due to procedures done.  -Status post 3 units packed red blood cells during this hospitalization.   -Hematology/oncology following.   Anxiety - Continue bupropion, BuSpar, citalopram  Gastritis Continue antiacid therapy with pantoprazole, famotidine and sucralfate. As needed antiemetics and continue with soft diet as tolerated.   Depression Continue with bupropion, citalopram and buspirone.   Class 3 obesity (HCC) BMI 40.1 Lifestyle modification Outpatient follow-up with PCP.  Occasional tremors: Essential tremors - Patient with history of essential tremors. -Resume home regimen propranolol. -Outpatient follow-up.  Hypomagnesemia - Magnesium at 2.2 today from 1.3 after magnesium sulfate 6 g IV given on 06/22/2022. -Repeat labs in the AM.         DVT prophylaxis: Full dose Lovenox Code Status: Full Family Communication: Updated patient and daughter at bedside. Disposition: Likely home when clinically improved, cleared by hematology/oncology and IR hopefully in the next 1 to 2 days..  Status is: Inpatient Remains inpatient appropriate because:  Severity of illness   Consultants:  IR: Dr. [name] 06/21/2022 Hematology/oncology: Dr. [name] 06/14/2022  Procedures:  CT abdomen and pelvis 06/13/2022 Abdominal films 06/15/2022 Bilateral lower extremity Dopplers 06/14/2022 CT L-spine 06/13/2022 CT venogram abdomen and pelvis 06/15/2022 Tunneled Port-A-Cath placement with ultrasound and fluoroscopic guidance per IR 06/20/2022, Dr. Jarvis Newcomer Ultrasound renal artery duplex 06/15/2022 IVC and bilateral lower extremity venogram and mechanical thrombolysis/left IJ Port-A-Cath placement by IR, Dr. Vernard Gambles 06/20/2022 Transfusion 3 units packed red blood cells  Antimicrobials:  Anti-infectives (From admission, onward)    Start     Dose/Rate Route Frequency Ordered Stop   06/14/22 1200  cefTRIAXone (ROCEPHIN) 1 g in sodium chloride 0.9 % 100 mL IVPB  Status:  Discontinued        1 g 200 mL/hr over 30 Minutes Intravenous Every 24 hours 06/14/22 1048 06/18/22 1428         Subjective: Sitting up in bed.  Overall feeling better.  No chest pain or shortness of breath.  Still with abdominal pain that is intermittent per patient however slowly improving.  Denies any bleeding.  Complaining that she has some weeping in her legs.  Patient also noted some blisters on her legs.  Asking whether he can be placed back on her propranolol for her essential tremors.  Overall feeling better.  Daughter at bedside.  Asking when she is going to be able to go home.   Objective: Vitals:  06/23/22 0334 06/23/22 0851 06/23/22 1240 06/23/22 1657  BP: (!) 143/80 132/86 119/68 134/77  Pulse: 83 78 89 77  Resp: '18 18 18 18  '$ Temp: 98.2 F (36.8 C) 98.5 F (36.9 C) 98.9 F (37.2 C) 98.5 F (36.9 C)  TempSrc: Oral Oral Oral Oral  SpO2: 93% 99% 99% 98%  Weight:      Height:        Intake/Output Summary (Last 24 hours) at 06/23/2022 1732 Last data filed at 06/23/2022 1100 Gross per 24 hour  Intake 120 ml  Output 0 ml  Net 120 ml   Filed Weights   06/17/22 0427  06/18/22 0449 06/20/22 0500  Weight: 114.7 kg 114.6 kg 120.2 kg    Examination:  General exam: NAD Respiratory system: CTA B anterior lung fields..  No wheezes, no crackles, no rhonchi.  Normal respiratory effort.   Cardiovascular system: Regular rate rhythm no murmurs rubs or gallops.  No JVD.  Bilateral lower extremity edema.   Gastrointestinal system: Abdomen is soft, nondistended, decreased tenderness to palpation in the epigastric region and suprapubic region.  Positive bowel sounds.  Central nervous system: Alert and oriented.  Moving extremities spontaneously.  No focal neurological deficits. Extremities: Symmetric 5 x 5 power. Skin: No rashes, lesions or ulcers Psychiatry: Judgement and insight appear normal. Mood & affect appropriate.     Data Reviewed:   CBC: Recent Labs  Lab 06/17/22 0411 06/18/22 0439 06/19/22 0206 06/20/22 0610 06/21/22 0314 06/22/22 0556 06/23/22 0521  WBC 10.8*   < > 11.0* 10.5 13.2* 11.6* 9.1  NEUTROABS 6.0  --   --   --   --   --   --   HGB 10.1*   < > 9.4* 9.9* 7.6* 7.0* 7.8*  HCT 33.3*   < > 29.3* 32.4* 24.4* 22.4* 24.4*  MCV 94.1   < > 91.8 94.5 94.6 94.1 93.8  PLT 409*   < > 422* 404* 277 226 218   < > = values in this interval not displayed.    Basic Metabolic Panel: Recent Labs  Lab 06/19/22 0206 06/20/22 0610 06/21/22 0314 06/22/22 0556 06/23/22 0521  NA 141 142 141 139 142  K 3.0* 3.6 3.9 3.3* 3.4*  CL 108 111 111 109 111  CO2 21* 24 21* 24 25  GLUCOSE 161* 150* 143* 135* 119*  BUN '13 11 13 13 8  '$ CREATININE 0.99 0.92 0.80 0.96 0.98  CALCIUM 8.8* 8.9 8.4* 8.6* 8.6*  MG  --   --   --  1.3* 2.2    GFR: Estimated Creatinine Clearance: 81.7 mL/min (by C-G formula based on SCr of 0.98 mg/dL).  Liver Function Tests: Recent Labs  Lab 06/17/22 0411 06/22/22 0556  AST 26 18  ALT 17 11  ALKPHOS 138* 95  BILITOT 0.6 0.3  PROT 7.1 5.6*  ALBUMIN 3.2* 2.5*    CBG: Recent Labs  Lab 06/17/22 1943 06/18/22 0616  06/19/22 0617 06/20/22 0614 06/21/22 0640  GLUCAP 92 87 138* 139* 128*     Recent Results (from the past 240 hour(s))  Urine Culture     Status: Abnormal   Collection Time: 06/14/22 10:48 AM   Specimen: Urine, Clean Catch  Result Value Ref Range Status   Specimen Description   Final    URINE, CLEAN CATCH Performed at [facility], [place] [address] [place], [place] [address]    Special Requests   Final    NONE Performed at [facility], [place] [address], [place], [place] [address]    Culture >=100,000 COLONIES/mL ESCHERICHIA COLI (A)  Final   Report Status 06/16/2022 FINAL  Final   Organism ID, Bacteria ESCHERICHIA COLI (A)  Final      Susceptibility   Escherichia coli - MIC*    AMPICILLIN 4 SENSITIVE Sensitive     CEFAZOLIN <=4 SENSITIVE Sensitive     CEFEPIME <=0.12 SENSITIVE Sensitive     CEFTRIAXONE <=0.25 SENSITIVE Sensitive     CIPROFLOXACIN <=0.25 SENSITIVE Sensitive     GENTAMICIN <=1 SENSITIVE Sensitive     IMIPENEM <=0.25 SENSITIVE Sensitive     NITROFURANTOIN 32 SENSITIVE Sensitive     TRIMETH/SULFA <=20 SENSITIVE Sensitive     AMPICILLIN/SULBACTAM <=2 SENSITIVE Sensitive     PIP/TAZO <=4 SENSITIVE Sensitive     * >=100,000 COLONIES/mL ESCHERICHIA COLI  Urine Culture     Status: None (Preliminary result)   Collection Time: 06/22/22  2:08 PM   Specimen: Urine, Clean Catch  Result Value Ref Range Status   Specimen Description URINE, CLEAN CATCH  Final   Special Requests NONE  Final   Culture   Final    CULTURE REINCUBATED FOR BETTER GROWTH Performed at [facility], [address], [place], [place] [address]    Report Status PENDING  Incomplete         Radiology Studies: No results found.      Scheduled Meds:  buPROPion  150 mg Oral Daily   busPIRone  15 mg Oral TID   Chlorhexidine Gluconate Cloth  6 each Topical Daily   citalopram  40 mg Oral Daily   dicyclomine  20 mg Oral TID AC & HS    diphenoxylate-atropine  2 tablet Oral QID   enoxaparin (LOVENOX) injection  100 mg Subcutaneous BID   famotidine  20 mg Oral BID   meclizine  25 mg Oral TID   multivitamin with minerals  1 tablet Oral Daily   oxyCODONE  10 mg Oral Q12H   pantoprazole  40 mg Oral BID AC   polyethylene glycol  17 g Oral Daily   potassium chloride  40 mEq Oral Once   propranolol  20 mg Oral BID   sodium chloride flush  10-40 mL Intracatheter Q12H   sodium chloride flush  3 mL Intravenous Q12H   sucralfate  1 g Oral Q6H   Continuous Infusions:  sodium chloride 10 mL/hr at 06/17/22 2348   promethazine (PHENERGAN) injection (IM or IVPB) 6.25 mg (06/22/22 2157)     LOS: 9 days    Time spent: 40 minutes    Irine Seal, MD [facility]   To contact the attending provider between 7A-7P or the covering provider during after hours 7P-7A, please log into the web site [email or web address] and access using universal [place] password for that web site. If you do not have the password, please call the hospital operator.  06/23/2022, 5:32 PM

## 2022-06-23 NOTE — Evaluation (Signed)
Occupational Therapy Evaluation Patient Details Name: Wendy Barber MRN: [id number] DOB: 1961/05/25 Today's Date: 06/23/2022   History of Present Illness Pt is a 61 y.o. female admitted 06/13/22 with BLE swelling, weakness, near-syncopal episodes at home. Workup with extensive BLE DVT extending up to IVC filter. S/p IVC and BLE venogram and mechanical thrombolysis, LIJ port placement 8/28. IR had planned to remove IVC filter 8/30, procedure canceled per IR at this time no plans to remove IVC filter. CA of appendix metastatic. PMH includes DVT/PE (s/p IVC filter 2005), HTN, DM2, anemia, anxiety, autoimmune hepatitis, chronic fatigue, fibromyalgia, obesity, depression.   Clinical Impression   Pt completes selfcare tasks and functional transfers at a modified independent level currently.  She is continuing to have BLE pain which affects efficiency with transfers and selfcare tasks but with use of the RW she is safe.  No further OT needs at this time      Recommendations for follow up therapy are one component of a multi-disciplinary discharge planning process, led by the attending physician.  Recommendations may be updated based on patient status, additional functional criteria and insurance authorization.   Follow Up Recommendations  No OT follow up    Assistance Recommended at Discharge None  Patient can return home with the following Assist for transportation    Functional Status Assessment  Patient has not had a recent decline in their functional status  Equipment Recommendations  None recommended by OT       Precautions / Restrictions Precautions Precautions: None Restrictions Weight Bearing Restrictions: No      Mobility Bed Mobility Overal bed mobility: Modified Independent                  Transfers Overall transfer level: Modified independent Equipment used: Rolling walker (2 wheels)                      Balance Overall balance assessment: Needs  assistance Sitting-balance support: Feet supported, No upper extremity supported Sitting balance-Leahy Scale: Good     Standing balance support: During functional activity, Reliant on assistive device for balance Standing balance-Leahy Scale: Fair Standing balance comment: Pt able to maintain standing balance with supervision but benefits from UE support with use of the RW for safety.                           ADL either performed or assessed with clinical judgement   ADL Overall ADL's : Modified independent                                       General ADL Comments: Pt is currently modified independent for mobility and selfcare tasks at this time.  No further OT needs.     Vision Baseline Vision/History: 0 No visual deficits Ability to See in Adequate Light: 0 Adequate Patient Visual Report: No change from baseline Vision Assessment?: No apparent visual deficits     Perception Perception Perception: Within Functional Limits   Praxis Praxis Praxis: Intact    Pertinent Vitals/Pain Pain Assessment Pain Assessment: 0-10 Pain Score: 7  Pain Location: BLEs Pain Descriptors / Indicators: Sore, Discomfort, Heaviness, Pressure Pain Intervention(s): Limited activity within patient's tolerance, Repositioned     Hand Dominance Right   Extremity/Trunk Assessment Upper Extremity Assessment Upper Extremity Assessment: RUE deficits/detail;LUE deficits/detail RUE Deficits / Details: Pt with history of  bilateral rotator cuff repair with limited shoulder flexion to approximately 100 degrees.  AROM elbow flexion/extension WFLs with strength at 3+/5 throughout.  Pt with report of carpal tunnel and numbness in the 4th and 5th digits. RUE Coordination: WNL LUE Deficits / Details: Pt with history of bilateral rotator cuff repair with limited shoulder flexion to approximately 100 degrees.  AROM elbow flexion/extension WFLs with strength at 3+/5 throughout. LUE  Coordination: WNL   Lower Extremity Assessment Lower Extremity Assessment: Defer to PT evaluation   Cervical / Trunk Assessment Cervical / Trunk Assessment: Normal   Communication Communication Communication: No difficulties   Cognition Arousal/Alertness: Awake/alert Behavior During Therapy: WFL for tasks assessed/performed Overall Cognitive Status: Within Functional Limits for tasks assessed                                                  Home Living Family/patient expects to be discharged to:: Private residence Living Arrangements: Spouse/significant other Available Help at Discharge: Family;Available 24 hours/day Type of Home: House Home Access: Stairs to enter [employer] of Steps: 6 Entrance Stairs-Rails: Right Home Layout: One level     Bathroom Shower/Tub: Occupational psychologist: Standard Bathroom Accessibility: Yes   Home Equipment: Conservation officer, nature (2 wheels);Rollator (4 wheels);Shower seat;Cane - single point;BSC/3in1          Prior Functioning/Environment Prior Level of Function : Independent/Modified Independent             Mobility Comments: Ambulating limited community distances, typically without DME but use of rollator PRN ADLs Comments: Family assists with ADL/iADLs as needed, pt limited by fatigue and low activity tolerance the past few months                        AM-PAC OT "6 Clicks" Daily Activity     Outcome Measure Help from another person eating meals?: None Help from another person taking care of personal grooming?: None Help from another person toileting, which includes using toliet, bedpan, or urinal?: None Help from another person bathing (including washing, rinsing, drying)?: None Help from another person to put on and taking off regular upper body clothing?: None Help from another person to put on and taking off regular lower body clothing?: None 6 Click Score: 24   End of Session  Equipment Utilized During Treatment: Gait belt Nurse Communication: Mobility status  Activity Tolerance: Patient tolerated treatment well Patient left: in bed;with call bell/phone within reach                   Time: 1610-9604 OT Time Calculation (min): 33 min Charges:  OT General Charges $OT Visit: 1 Visit OT Evaluation $OT Eval Moderate Complexity: 1 Mod OT Treatments $Self Care/Home Management : 8-22 mins Curvin Hunger OTR/L 06/23/2022, 5:25 PM

## 2022-06-23 NOTE — Progress Notes (Signed)
Patient was brought down to IR for IVC filter retrieval yesterday; however, it was found that she still have some DVT in her femoral vein.   The procedure was canceled per Dr. [name], she will be seen at the outpatient clinic in 6 week to see if she is a candidate for IVC filter retrieval.   F/u order placed, schedulers will call the patient to set up the appointment.  Please call IR for questions and concerns.   [place] PA-C 06/23/2022 2:02 PM

## 2022-06-23 NOTE — Assessment & Plan Note (Addendum)
-   Patient with history of essential tremors. -Patient's home regimen of propranolol initially held and resumed during the hospitalization.  -Outpatient follow-up.

## 2022-06-23 NOTE — Evaluation (Signed)
Physical Therapy Evaluation Patient Details Name: Wendy Barber MRN: [id number] DOB: 24-Nov-1960 Today's Date: 06/23/2022  History of Present Illness  Pt is a 61 y.o. female admitted 06/13/22 with BLE swelling, weakness, near-syncopal episodes at home. Workup with extensive BLE DVT extending up to IVC filter. S/p IVC and BLE venogram and mechanical thrombolysis, LIJ port placement 8/28. IR had planned to remove IVC filter 8/30, procedure canceled per IR at this time no plans to remove IVC filter. CA of appendix metastatic. PMH includes DVT/PE (s/p IVC filter 2005), HTN, DM2, anemia, anxiety, autoimmune hepatitis, chronic fatigue, fibromyalgia, obesity, depression.   Clinical Impression  Patient evaluated by Physical Therapy with no further acute PT needs identified. PTA, pt mod indep with intermittent use of rollator, lives with family who assist with ADL/iADLs as needed due to fatigue and low activity tolerance. Today, pt ambulatory in room without assist; trialled use of RW to offload painful BLEs with some improvement. All education has been completed and the patient has no further questions. Acute PT is signing off. Thank you for this referral.    Recommendations for follow up therapy are one component of a multi-disciplinary discharge planning process, led by the attending physician.  Recommendations may be updated based on patient status, additional functional criteria and insurance authorization.  Follow Up Recommendations No PT follow up      Assistance Recommended at Discharge PRN  Patient can return home with the following  A little help with bathing/dressing/bathroom;Assistance with cooking/housework;Assist for transportation;Help with stairs or ramp for entrance    Equipment Recommendations None recommended by PT  Recommendations for Other Services   Occupational Therapy; Mobility Specialist   Functional Status Assessment       Precautions / Restrictions  Precautions Precautions: Other (comment) Precaution Comments: pt feels BLEs are weeping Restrictions Weight Bearing Restrictions: No      Mobility  Bed Mobility Overal bed mobility: Modified Independent                  Transfers Overall transfer level: Independent Equipment used: None               General transfer comment: initial mobility indep without DME, also trialled RW to offload painful BLEs    Ambulation/Gait Ambulation/Gait assistance: Modified independent (Device/Increase time) Gait Distance (Feet): 40 Feet     Gait velocity: decreased     General Gait Details: ambulating lap in room without DME, preference to reach for sink for UE support secondary to pain and "wobbling" due to BLE swelling; additional gait trial with RW to offload pain; mod indep with and without DME. pt declined hallway ambulation secondary to discomfort  Stairs            Wheelchair Mobility    Modified Rankin (Stroke Patients Only)       Balance Overall balance assessment: Needs assistance Sitting-balance support: Feet supported, No upper extremity supported Sitting balance-Leahy Scale: Good     Standing balance support: During functional activity, No upper extremity supported Standing balance-Leahy Scale: Fair Standing balance comment: ambulatory without UE support                             Pertinent Vitals/Pain Pain Assessment Pain Assessment: Faces Faces Pain Scale: Hurts even more Pain Location: BLEs Pain Descriptors / Indicators: Sore, Discomfort, Heaviness, Pressure Pain Intervention(s): Monitored during session, Limited activity within patient's tolerance    Home Living Family/patient expects to be discharged to::  Private residence Living Arrangements: Spouse/significant other Available Help at Discharge: Family;Available 24 hours/day Type of Home: House Home Access: Stairs to enter Entrance Stairs-Rails: Right Entrance  Stairs-Number of Steps: 6   Home Layout: One level Home Equipment: Conservation officer, nature (2 wheels);Rollator (4 wheels);Shower seat;Cane - single point      Prior Function Prior Level of Function : Independent/Modified Independent             Mobility Comments: Ambulating limited community distances, typically without DME but use of rollator PRN ADLs Comments: Family assists with ADL/iADLs as needed, pt limited by fatigue and low activity tolerance the past few months     Hand Dominance        Extremity/Trunk Assessment   Upper Extremity Assessment Upper Extremity Assessment: Overall WFL for tasks assessed    Lower Extremity Assessment Lower Extremity Assessment: Generalized weakness (noted BLE swelling, limited ROM secondary to this and pain; pt c/o sensation "my legs feel like they're oozing", no weeping observed grossly)       Communication   Communication: No difficulties  Cognition Arousal/Alertness: Awake/alert Behavior During Therapy: WFL for tasks assessed/performed Overall Cognitive Status: Within Functional Limits for tasks assessed                                          General Comments      Exercises     Assessment/Plan    PT Assessment Patient does not need any further PT services  PT Problem List         PT Treatment Interventions      PT Goals (Current goals can be found in the Care Plan section)  Acute Rehab PT Goals Patient Stated Goal: decreased LE pain PT Goal Formulation: All assessment and education complete, DC therapy    Frequency       Co-evaluation               AM-PAC PT "6 Clicks" Mobility  Outcome Measure Help needed turning from your back to your side while in a flat bed without using bedrails?: None Help needed moving from lying on your back to sitting on the side of a flat bed without using bedrails?: None Help needed moving to and from a bed to a chair (including a wheelchair)?: None Help needed  standing up from a chair using your arms (e.g., wheelchair or bedside chair)?: None Help needed to walk in hospital room?: None Help needed climbing 3-5 steps with a railing? : A Little 6 Click Score: 23    End of Session   Activity Tolerance: Patient tolerated treatment well;Patient limited by pain Patient left: in bed;with call bell/phone within reach Nurse Communication: Mobility status PT Visit Diagnosis: Other abnormalities of gait and mobility (R26.89)    Time: 7619-5093 PT Time Calculation (min) (ACUTE ONLY): 15 min   Charges:   PT Evaluation $PT Eval Low Complexity: 1 Low        Mabeline Caras, PT, DPT Acute Rehabilitation Services  Personal: [facility] Office: [place] 06/23/2022, 10:20 AM

## 2022-06-24 ENCOUNTER — Other Ambulatory Visit (HOSPITAL_COMMUNITY): Payer: Self-pay

## 2022-06-24 LAB — BASIC METABOLIC PANEL
Anion gap: 9 (ref 5–15)
BUN: 10 mg/dL (ref 6–20)
CO2: 25 mmol/L (ref 22–32)
Calcium: 8.2 mg/dL — ABNORMAL LOW (ref 8.9–10.3)
Chloride: 108 mmol/L (ref 98–111)
Creatinine, Ser: 0.96 mg/dL (ref 0.44–1.00)
GFR, Estimated: >60 mL/min (ref >60)
Glucose, Bld: 125 mg/dL — ABNORMAL HIGH (ref 70–99)
Potassium: 3.5 mmol/L (ref 3.5–5.1)
Sodium: 142 mmol/L (ref 135–145)

## 2022-06-24 LAB — GLUCOSE, CAPILLARY: Glucose-Capillary: 116 mg/dL — ABNORMAL HIGH (ref 70–99)

## 2022-06-24 LAB — IRON AND TIBC
Iron: 91 ug/dL (ref 28–170)
Saturation Ratios: 33 % — ABNORMAL HIGH (ref 10.4–31.8)
TIBC: 279 ug/dL (ref 250–450)
UIBC: 188 ug/dL

## 2022-06-24 LAB — CBC
HCT: 24.1 % — ABNORMAL LOW (ref 36.0–46.0)
Hemoglobin: 7.7 g/dL — ABNORMAL LOW (ref 12.0–15.0)
MCH: 30.4 pg (ref 26.0–34.0)
MCHC: 32 g/dL (ref 30.0–36.0)
MCV: 95.3 fL (ref 80.0–100.0)
Platelets: 208 10*3/uL (ref 150–400)
RBC: 2.53 MIL/uL — ABNORMAL LOW (ref 3.87–5.11)
RDW: 20.6 % — ABNORMAL HIGH (ref 11.5–15.5)
WBC: 8.9 10*3/uL (ref 4.0–10.5)
nRBC: 5.6 % — ABNORMAL HIGH (ref 0.0–0.2)

## 2022-06-24 LAB — URINE CULTURE

## 2022-06-24 LAB — PREPARE RBC (CROSSMATCH)

## 2022-06-24 LAB — MAGNESIUM: Magnesium: 1.8 mg/dL (ref 1.7–2.4)

## 2022-06-24 MED ORDER — ENOXAPARIN SODIUM 100 MG/ML IJ SOSY
100.0000 mg | PREFILLED_SYRINGE | Freq: Two times a day (BID) | INTRAMUSCULAR | 6 refills | Status: DC
  Filled 2022-06-24 (×2): qty 60, 30d supply, fill #0
  Filled 2022-08-07: qty 60, 30d supply, fill #1
  Filled 2022-09-12: qty 60, 30d supply, fill #2

## 2022-06-24 MED ORDER — HEPARIN SOD (PORK) LOCK FLUSH 100 UNIT/ML IV SOLN: 500.0000 [IU] | Freq: Every day | INTRAVENOUS | Status: DC | PRN

## 2022-06-24 MED ORDER — FUROSEMIDE 10 MG/ML IJ SOLN: 40.0000 mg | Freq: Once | INTRAMUSCULAR | Status: DC

## 2022-06-24 MED ORDER — SODIUM CHLORIDE 0.9% FLUSH: 3.0000 mL | INTRAVENOUS | Status: DC | PRN

## 2022-06-24 MED ORDER — POTASSIUM CHLORIDE CRYS ER 20 MEQ PO TBCR
40.0000 meq | EXTENDED_RELEASE_TABLET | Freq: Once | ORAL | Status: AC
  Administered 2022-06-24: 40 meq via ORAL
  Filled 2022-06-24: qty 2

## 2022-06-24 MED ORDER — SODIUM CHLORIDE 0.9% IV SOLUTION: 250.0000 mL | Freq: Once | INTRAVENOUS | Status: DC

## 2022-06-24 MED ORDER — HEPARIN SOD (PORK) LOCK FLUSH 100 UNIT/ML IV SOLN: 250.0000 [IU] | INTRAVENOUS | Status: DC | PRN

## 2022-06-24 MED ORDER — SODIUM CHLORIDE 0.9% FLUSH
10.0000 mL | INTRAVENOUS | Status: AC | PRN
  Administered 2022-06-25: 10 mL

## 2022-06-24 NOTE — Progress Notes (Signed)
Ms. Bellissimo is feeling a little bit better.  She still has swelling in her legs.  The IVC filter cannot be removed as there is still residual clot in the femoral vein.  She is on Lovenox twice daily dosing.  Lovenox level seemed okay.  I think she will have to be on Lovenox for quite a while.  Her hemoglobin is only 7.7.  I thought she was transfused a couple days ago.  I will surprised by this.  Again, this certainly will contribute to her leg swelling.  I probably would have to transfuse her again.    She is not really complaining of any abdominal pain.  She was out of bed a little bit yesterday.  She is urinating quite a bit when she says.  She did have a E. coli urinary tract infection.  There is no fever.  Has had no cough or chest wall pain.  There is been no bleeding.  Her vital signs show temperature of 98.2.  Pulse 82.  Blood pressure 135/81.  Her lungs sound clear bilaterally.  Cardiac exam regular rate and rhythm.  There are no murmurs.  Abdomen is obese but soft.  She has slightly decreased bowel sounds.  There is no fluid wave.  Extremities does show swelling in her legs.  They do not feel tight.  Again she is going need lifelong anticoagulation.  I would hate to have to commit her to lifelong Lovenox.  However, this just might be what is most effective for her.  We will continue to follow along.  Hopefully, she will be able to go home soon.  She will need to be transfused again.  Again, I am little surprised that her hemoglobin has not come up more.  I want to check her iron studies.  I do appreciate the incredible care she is getting from the staff upon 4 [address].  [name], MD  Acts [phone]

## 2022-06-24 NOTE — Progress Notes (Signed)
Port-A-Cath placement PROGRESS NOTE    Wendy Barber  WER:[id number] DOB: 1961/10/23 DOA: 06/13/2022 PCP: [name], MD    Chief Complaint  Patient presents with   Loss of Consciousness    Brief Narrative:  [name] was admitted to the hospital with the working diagnosis of bilateral lower extremity extensive DVT.   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 On her initial physical examination her blood pressure was 69/40, HR 88, 02 saturation 100%, lungs with no wheezing or rhonchi, heart with S1 and S2 present and rhythmic, abdomen tender to palpation in the epigastric area, no lower extremity edema.   Na 135, K 3,9, cl 103, bicarbonate 19 glucose 148 bun 42 cr 2,0 Wbc 15, hgb 8,8 plt 185  Urine analysis with SG 1,015, > 50 wbc, 11-20 rbc. 30 protein.   Abdominal radiograph with increased bowel gas suggesting ileus.   CT abdomen and pelvis with bilateral pelvic and femoral venous thrombosis, IVC filter in place. Positive cirrhosis.   Subsequent imaging showed bilateral DVT extending up to the IVC.  Started on heparin and admitted.  IR and Oncology consulted.  EKG 74 bpm, left axis deviation, normal intervals, sinus rhythm with ST depression and T wave inversions in V3 to V6.   CT venogram with thrombosed inferior venal cava and bilateral common iliac, external iliac, common femoral and visualized femoral veins.  Thrombosed infrarenal IVC filter with small amount of thrombus extending superior to the filter apex. Renal veins patent.  Mild right hydronephrosis.   Renal arterial US with no evidence of renal artery stenosis.   Patient was placed on heparin drip for  anticoagulation and IR was consulted.  08/26 transferred to [facility] for thrombectomy.  08/27 continue to have pain lower extremities with movement. 08/28 IVC and bilateral lower extremity venogram and mechanical thrombectomy. L IJ Port catheter placement.  08/29 transitioned to enoxaparin from IV heparin     Assessment & Plan:  Principal Problem:   DVT, bilateral lower limbs (HCC) Active Problems:   UTI (urinary tract infection)   AKI (acute kidney injury) ([place])   CAD (coronary artery disease)   DM2 (diabetes mellitus, type 2) (HCC)   HTN (hypertension)   Iron deficiency anemia due to chronic blood loss   Anxiety   Gastritis   Depression   Class 3 obesity (HCC)   Hypomagnesemia   Iron deficiency anemia   Occasional tremors: Essential tremors    Assessment and Plan: * DVT, bilateral lower limbs (HCC) Extensive deep vein thrombosis, bilateral lower extremities and inferior vena cava. IVC filter.  08/28 IVC and bilateral lower extremity venogram and mechanical thrombolysis L IJ port catheter placement.   Patient was on heparin IV for anticoagulation, and transitioned subsequently to full dose enoxaparin.   Patient with persistent pain in her lower extremities and lower abdomen.  Continue long acting oxycodone 10 mg bid, continue with short acting oxycodone 5 mg prn, and IV hydromorphone PRN.  Scheduled acetaminophen. Patient had side effects to gabapentin in the past.   Avoid non steroidal anti inflammatory medications due to full anticoagulation.   -IR had planned to remove IVC filter on 06/22/2022 however, procedure canceled per IR has residual clot noted in the femoral vein, at this time no  plans to remove IVC filter at this time during this hospitalization.  UTI (urinary tract infection) Status post 5-day course of IV Rocephin.   -Patient with some complaints of dysuria although improved and as such repeat urinalysis was done with trace leukocytes, nitrite negative, 6-10 WBCs.    -Urine cultures with multiple species. -No need for antibiotics at this time.  AKI (acute kidney injury) ([place]) Hypokalemia,   Renal failure has resolved, her serum cr today is 0, 96 with K at 3,5 and serum bicarbonate at 25. -K-Dur 40 mEq p.o. x1. Continue to follow up renal function as needed.   CAD (coronary artery disease) Continue with antiplatelet therapy.   DM2 (diabetes mellitus, type 2) (HCC) Glucose has been well controlled, holding on insulin therapy. Fasting glucose is 116 mg/dl today.   HTN (hypertension) BP controlled with systolic blood pressures ranging from 119-140s.   -Continue propranolol.   Iron deficiency anemia due to chronic blood loss -Status post IV iron.  -Hemoglobin currently at 7.7 this morning. -Patient with no significant overt bleeding noted.  -Anemia in part likely due to procedures done.  -Status post 3 units packed red blood cells during this hospitalization.   -Hematology/oncology following and have ordered 2 units packed red blood cells today..   Anxiety - Continue bupropion, BuSpar, citalopram  Gastritis Continue antiacid therapy with pantoprazole, famotidine and sucralfate. As needed antiemetics and continue with soft diet as tolerated.   Depression Continue with bupropion, citalopram and buspirone.   Class 3 obesity (HCC) BMI 40.1 Lifestyle modification Outpatient follow-up with PCP.  Occasional tremors: Essential tremors - Patient with history of essential tremors. -Home regimen propanolol resumed.   -Outpatient follow-up.   Hypomagnesemia - Magnesium at 1.8 from 2.2 from 1.3 after magnesium sulfate 6 g IV given on 06/22/2022. -Repeat labs in the AM.         DVT prophylaxis: Full dose Lovenox Code Status: Full Family Communication: Updated patient, no family at bedside. Disposition: Likely home when clinically improved, cleared by hematology/oncology and IR hopefully in the next 1 to 2 days..  Status is:  Inpatient Remains inpatient appropriate because: Severity of illness   Consultants:  IR: Dr. [name] 06/21/2022 Hematology/oncology: Dr. [name] 06/14/2022  Procedures:  CT abdomen and pelvis 06/13/2022 Abdominal films 06/15/2022 Bilateral lower extremity Dopplers 06/14/2022 CT L-spine 06/13/2022 CT venogram abdomen and pelvis 06/15/2022 Tunneled Port-A-Cath placement with ultrasound and fluoroscopic guidance per IR 06/20/2022, Dr. Jarvis Newcomer Ultrasound renal artery duplex 06/15/2022 IVC and bilateral lower extremity venogram and mechanical thrombolysis/left IJ Port-A-Cath placement by IR, Dr. Vernard Gambles 06/20/2022 Transfusion 3 units packed red blood cells Transfusion 2 units packed red blood cells pending 06/24/2022  Antimicrobials:  Anti-infectives (From admission, onward)    Start     Dose/Rate Route Frequency Ordered Stop   06/14/22 1200  cefTRIAXone (ROCEPHIN) 1 g in sodium chloride 0.9 % 100 mL IVPB  Status:  Discontinued        1 g 200 mL/hr over 30 Minutes Intravenous Every 24 hours 06/14/22 1048 06/18/22 1428         Subjective: Patient sitting up at the side of the bed.  Denies any chest pain or shortness of breath.  Still with some intermittent abdominal pain.  Stated had lots of urine output yesterday after diuretics, feels tightness and swelling in legs are improving.  Denies any overt bleeding.  States she saw hematology/oncology this morning.  Was hoping to go home today however noted she is to get RBC transfusions.  Objective: Vitals:   06/24/22 1510 06/24/22 1616 06/24/22 1636 06/24/22 1902  BP: (!) 144/83 (!) 140/95 (!) 144/75 (!) 146/96  Pulse: 72 70 68 80  Resp: '18 16 16 16  '$ Temp: 98.7 F (37.1 C) 98.5 F (36.9 C) 98.4 F (36.9 C) 98.8 F (37.1 C)  TempSrc: Oral Oral Oral Oral  SpO2: 100% 99% 100%   Weight:      Height:        Intake/Output Summary (Last 24 hours) at 06/24/2022 1927 Last data filed at 06/24/2022 1856 Gross per 24 hour  Intake 554 ml  Output 1150  ml  Net -596 ml   Filed Weights   06/20/22 0500 06/24/22 0433 06/24/22 0729  Weight: 120.2 kg 120.1 kg 117.6 kg    Examination:  General exam: NAD. Respiratory system: Lungs clear to auscultation bilaterally.  No wheezes, no crackles, no rhonchi.  Normal respiratory effort.   Cardiovascular system: RRR no murmurs rubs or gallops.  No JVD.  Improving bilateral lower extremity edema.   Gastrointestinal system: Abdomen is soft, nondistended, decreased tenderness to palpation in the epigastric region and suprapubic region.  Positive bowel sounds.  Central nervous system: Alert and oriented.  Moving extremities spontaneously.  No focal neurological deficits. Extremities: Symmetric 5 x 5 power. Skin: No rashes, lesions or ulcers Psychiatry: Judgement and insight appear normal. Mood & affect appropriate.     Data Reviewed:   CBC: Recent Labs  Lab 06/20/22 0610 06/21/22 0314 06/22/22 0556 06/23/22 0521 06/24/22 0551  WBC 10.5 13.2* 11.6* 9.1 8.9  HGB 9.9* 7.6* 7.0* 7.8* 7.7*  HCT 32.4* 24.4* 22.4* 24.4* 24.1*  MCV 94.5 94.6 94.1 93.8 95.3  PLT 404* 277 226 218 308    Basic Metabolic Panel: Recent Labs  Lab 06/20/22 0610 06/21/22 0314 06/22/22 0556 06/23/22 0521 06/24/22 0551  NA 142 141 139 142 142  K 3.6 3.9 3.3* 3.4* 3.5  CL 111 111 109 111 108  CO2 24 21* '24 25 25  '$ GLUCOSE 150* 143* 135* 119* 125*  BUN '11 13 13 8 10  '$ CREATININE 0.92 0.80 0.96 0.98 0.96  CALCIUM 8.9 8.4* 8.6* 8.6* 8.2*  MG  --   --  1.3* 2.2 1.8    GFR: Estimated Creatinine Clearance: 82.3 mL/min (by C-G formula based on SCr of 0.96 mg/dL).  Liver Function Tests: Recent Labs  Lab 06/22/22 0556  AST 18  ALT 11  ALKPHOS 95  BILITOT 0.3  PROT 5.6*  ALBUMIN 2.5*    CBG: Recent Labs  Lab 06/19/22 0617 06/20/22 0614 06/21/22 0640 06/23/22 2119 06/24/22 0612  GLUCAP 138* 139* 128* 141* 116*     Recent Results (from the past 240 hour(s))  Urine Culture     Status: Abnormal    Collection Time: 06/22/22  2:08 PM   Specimen: Urine, Clean Catch  Result Value Ref Range Status   Specimen Description URINE, CLEAN CATCH  Final   Special Requests   Final    NONE Performed at [facility], [place] [address], [place], [place] [address]    Culture MULTIPLE SPECIES PRESENT, SUGGEST RECOLLECTION (A)  Final   Report Status 06/24/2022 FINAL  Final         Radiology Studies: No results found.      Scheduled Meds:  sodium chloride  250 mL Intravenous Once   buPROPion  150 mg Oral Daily   busPIRone  15 mg Oral TID   Chlorhexidine Gluconate Cloth  6 each Topical Daily  citalopram  40 mg Oral Daily   dicyclomine  20 mg Oral TID AC & HS   diphenoxylate-atropine  2 tablet Oral QID   enoxaparin (LOVENOX) injection  100 mg Subcutaneous BID   famotidine  20 mg Oral BID   furosemide  40 mg Intravenous Once   furosemide  40 mg Intravenous Once   loperamide  4 mg Oral Once   meclizine  25 mg Oral TID   multivitamin with minerals  1 tablet Oral Daily   oxyCODONE  10 mg Oral Q12H   pantoprazole  40 mg Oral BID AC   propranolol  20 mg Oral BID   sodium chloride flush  10-40 mL Intracatheter Q12H   sodium chloride flush  3 mL Intravenous Q12H   sucralfate  1 g Oral Q6H   Continuous Infusions:  sodium chloride 10 mL/hr at 06/17/22 2348   promethazine (PHENERGAN) injection (IM or IVPB) 6.25 mg (06/22/22 2157)     LOS: 10 days    Time spent: 35 minutes    Irine Seal, MD [facility]   To contact the attending provider between 7A-7P or the covering provider during after hours 7P-7A, please log into the web site [email or web address] and access using universal [facility] password for that web site. If you do not have the password, please call the hospital operator.  06/24/2022, 7:27 PM

## 2022-06-25 DIAGNOSIS — K649 Unspecified hemorrhoids: Secondary | ICD-10-CM | POA: Diagnosis present

## 2022-06-25 LAB — CBC WITH DIFFERENTIAL/PLATELET
Abs Immature Granulocytes: 0.24 10*3/uL — ABNORMAL HIGH (ref 0.00–0.07)
Basophils Absolute: 0.1 10*3/uL (ref 0.0–0.1)
Basophils Relative: 1 %
Eosinophils Absolute: 0.4 10*3/uL (ref 0.0–0.5)
Eosinophils Relative: 4 %
HCT: 32.7 % — ABNORMAL LOW (ref 36.0–46.0)
Hemoglobin: 10.8 g/dL — ABNORMAL LOW (ref 12.0–15.0)
Immature Granulocytes: 3 %
Lymphocytes Relative: 33 %
Lymphs Abs: 2.7 10*3/uL (ref 0.7–4.0)
MCH: 30.7 pg (ref 26.0–34.0)
MCHC: 33 g/dL (ref 30.0–36.0)
MCV: 92.9 fL (ref 80.0–100.0)
Monocytes Absolute: 0.9 10*3/uL (ref 0.1–1.0)
Monocytes Relative: 11 %
Neutro Abs: 4 10*3/uL (ref 1.7–7.7)
Neutrophils Relative %: 48 %
Platelets: 219 10*3/uL (ref 150–400)
RBC: 3.52 MIL/uL — ABNORMAL LOW (ref 3.87–5.11)
RDW: 21.2 % — ABNORMAL HIGH (ref 11.5–15.5)
WBC: 8.3 10*3/uL (ref 4.0–10.5)
nRBC: 4.1 % — ABNORMAL HIGH (ref 0.0–0.2)

## 2022-06-25 LAB — COMPREHENSIVE METABOLIC PANEL
ALT: 12 U/L (ref 0–44)
AST: 19 U/L (ref 15–41)
Albumin: 2.8 g/dL — ABNORMAL LOW (ref 3.5–5.0)
Alkaline Phosphatase: 98 U/L (ref 38–126)
Anion gap: 6 (ref 5–15)
BUN: 9 mg/dL (ref 6–20)
CO2: 26 mmol/L (ref 22–32)
Calcium: 8.5 mg/dL — ABNORMAL LOW (ref 8.9–10.3)
Chloride: 108 mmol/L (ref 98–111)
Creatinine, Ser: 0.95 mg/dL (ref 0.44–1.00)
GFR, Estimated: >60 mL/min (ref >60)
Glucose, Bld: 117 mg/dL — ABNORMAL HIGH (ref 70–99)
Potassium: 4 mmol/L (ref 3.5–5.1)
Sodium: 140 mmol/L (ref 135–145)
Total Bilirubin: 0.7 mg/dL (ref 0.3–1.2)
Total Protein: 6.3 g/dL — ABNORMAL LOW (ref 6.5–8.1)

## 2022-06-25 LAB — TYPE AND SCREEN
ABO/RH(D): A POS
Antibody Screen: NEGATIVE
Unit division: 0
Unit division: 0
Unit division: 0

## 2022-06-25 LAB — BPAM RBC
Blood Product Expiration Date: 202309222359
Blood Product Expiration Date: 202309232359
Blood Product Expiration Date: 202309232359
ISSUE DATE / TIME: 202308301040
ISSUE DATE / TIME: 202309011604
ISSUE DATE / TIME: 202309012247
Unit Type and Rh: 6200
Unit Type and Rh: 6200
Unit Type and Rh: 6200

## 2022-06-25 LAB — GLUCOSE, CAPILLARY: Glucose-Capillary: 105 mg/dL — ABNORMAL HIGH (ref 70–99)

## 2022-06-25 MED ORDER — POTASSIUM CHLORIDE 10 MEQ/100ML IV SOLN
10.0000 meq | INTRAVENOUS | Status: AC
  Administered 2022-06-25 (×3): 10 meq via INTRAVENOUS
  Filled 2022-06-25 (×3): qty 100

## 2022-06-25 MED ORDER — FUROSEMIDE 10 MG/ML IJ SOLN
40.0000 mg | Freq: Once | INTRAMUSCULAR | Status: AC
  Administered 2022-06-25: 40 mg via INTRAVENOUS
  Filled 2022-06-25: qty 4

## 2022-06-25 MED ORDER — POTASSIUM CHLORIDE CRYS ER 10 MEQ PO TBCR
20.0000 meq | EXTENDED_RELEASE_TABLET | Freq: Once | ORAL | Status: DC
  Filled 2022-06-25: qty 2

## 2022-06-25 MED ORDER — HYDROCORTISONE (PERIANAL) 2.5 % EX CREA
TOPICAL_CREAM | Freq: Four times a day (QID) | CUTANEOUS | Status: DC
  Filled 2022-06-25: qty 28.35

## 2022-06-25 NOTE — Progress Notes (Signed)
Port-A-Cath placement PROGRESS NOTE    Wendy Barber  WJX:[id number] DOB: 09-15-1961 DOA: 06/13/2022 PCP: [name], MD    Chief Complaint  Patient presents with   Loss of Consciousness    Brief Narrative:  Wendy Barber was admitted to the hospital with the working diagnosis of bilateral lower extremity extensive DVT.   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 On her initial physical examination her blood pressure was 69/40, HR 88, 02 saturation 100%, lungs with no wheezing or rhonchi, heart with S1 and S2 present and rhythmic, abdomen tender to palpation in the epigastric area, no lower extremity edema.   Na 135, K 3,9, cl 103, bicarbonate 19 glucose 148 bun 42 cr 2,0 Wbc 15, hgb 8,8 plt 185  Urine analysis with SG 1,015, > 50 wbc, 11-20 rbc. 30 protein.   Abdominal radiograph with increased bowel gas suggesting ileus.   CT abdomen and pelvis with bilateral pelvic and femoral venous thrombosis, IVC filter in place. Positive cirrhosis.   Subsequent imaging showed bilateral DVT extending up to the IVC.  Started on heparin and admitted.  IR and Oncology consulted.  EKG 74 bpm, left axis deviation, normal intervals, sinus rhythm with ST depression and T wave inversions in V3 to V6.   CT venogram with thrombosed inferior venal cava and bilateral common iliac, external iliac, common femoral and visualized femoral veins.  Thrombosed infrarenal IVC filter with small amount of thrombus extending superior to the filter apex. Renal veins patent.  Mild right hydronephrosis.   Renal arterial US with no evidence of renal artery stenosis.   Patient was placed on heparin drip for  anticoagulation and IR was consulted.  08/26 transferred to [facility] for thrombectomy.  08/27 continue to have pain lower extremities with movement. 08/28 IVC and bilateral lower extremity venogram and mechanical thrombectomy. L IJ Port catheter placement.  08/29 transitioned to enoxaparin from IV heparin     Assessment & Plan:  Principal Problem:   DVT, bilateral lower limbs (HCC) Active Problems:   UTI (urinary tract infection)   AKI (acute kidney injury) ([place])   CAD (coronary artery disease)   DM2 (diabetes mellitus, type 2) (HCC)   HTN (hypertension)   Iron deficiency anemia due to chronic blood loss   Anxiety   Gastritis   Depression   Class 3 obesity (HCC)   Hypomagnesemia   Iron deficiency anemia   Occasional tremors: Essential tremors   Hemorrhoids    Assessment and Plan: * DVT, bilateral lower limbs (HCC) Extensive deep vein thrombosis, bilateral lower extremities and inferior vena cava. IVC filter.  08/28 IVC and bilateral lower extremity venogram and mechanical thrombolysis L IJ port catheter placement.   Patient was on heparin IV for anticoagulation, and transitioned subsequently to full dose enoxaparin.   Patient with persistent pain in her lower extremities and lower abdomen.  Continue long acting oxycodone 10 mg bid, continue with short acting oxycodone 5 mg prn, and IV hydromorphone PRN.  Scheduled acetaminophen. Patient had side effects to gabapentin in the past.   Avoid non steroidal anti inflammatory medications due to full anticoagulation.   -IR had planned to remove IVC filter on 06/22/2022 however, procedure canceled per IR has residual clot noted in the femoral vein, at  this time no plans to remove IVC filter at this time during this hospitalization. -Outpatient follow-up with IR.  UTI (urinary tract infection) Status post 5-day course of IV Rocephin.   -Patient with some complaints of dysuria although improved and as such repeat urinalysis was done with  trace leukocytes, nitrite negative, 6-10 WBCs.   -Urine cultures with multiple species. -No need for antibiotics at this time.  AKI (acute kidney injury) ([place]) Hypokalemia,   Renal failure has resolved, her serum cr today is 0, 95 with K at 4.0 and serum bicarbonate at 26. -K-Dur 40 mEq p.o. x1, as patient placed on IV Lasix. -Repeat labs in the AM.  CAD (coronary artery disease) Continue with antiplatelet therapy.   DM2 (diabetes mellitus, type 2) (HCC) Glucose has been well controlled, holding on insulin therapy. Fasting glucose is 105 mg/dl today.   HTN (hypertension) BP controlled with systolic blood pressures ranging from 130-150s.   -Patient also with complaints of abdominal pain. -Continue propranolol.   Iron deficiency anemia due to chronic blood loss -Status post IV iron.  -Hemoglobin currently at 10.8 this morning. -Patient with no significant overt bleeding noted.  -Anemia in part likely due to procedures done.  -Status post 5 units packed red blood cells during this hospitalization.   -Follow H&H and repeat labs in the AM. -We will need outpatient follow-up with hematology when discharged. -Hematology following.  Anxiety - Continue bupropion, BuSpar, citalopram  Gastritis Continue antiacid therapy with pantoprazole, famotidine and sucralfate. As needed antiemetics and continue with soft diet as tolerated.   Depression Continue with bupropion, citalopram and buspirone.   Class 3 obesity (HCC) BMI 40.1 Lifestyle modification Outpatient follow-up with PCP.  Hemorrhoids - Patient with complaints of hemorrhoidal pain with some blood noted on toilet tissue. -Place on Anusol cream 4 times daily.  Occasional tremors: Essential tremors - Patient with history of essential tremors. -Continue home regimen propranolol. -Outpatient follow-up.  Hypomagnesemia - Magnesium at 1.8 from 2.2 from 1.3 after magnesium sulfate 6 g IV given on 06/22/2022. -Repeat labs in  the AM.         DVT prophylaxis: Full dose Lovenox Code Status: Full Family Communication: Updated patient, no family at bedside. Disposition: Likely home when clinically improved, hemoglobin stabilized, cleared by hematology/oncology and IR hopefully in the next 1 to 2 days..  Status is: Inpatient Remains inpatient appropriate because: Severity of illness   Consultants:  IR: Dr. [name] 06/21/2022 Hematology/oncology: Dr. [name] 06/14/2022  Procedures:  CT abdomen and pelvis 06/13/2022 Abdominal films 06/15/2022 Bilateral lower extremity Dopplers 06/14/2022 CT L-spine 06/13/2022 CT venogram abdomen and pelvis 06/15/2022 Tunneled Port-A-Cath placement with ultrasound and fluoroscopic guidance per IR 06/20/2022, Dr. Jarvis Newcomer Ultrasound renal artery duplex 06/15/2022 IVC and bilateral lower extremity venogram and mechanical thrombolysis/left IJ Port-A-Cath placement by IR, Dr. Vernard Gambles 06/20/2022 Transfusion 3 units packed red blood cells Transfusion 2 units packed red blood cells pending 06/24/2022  Antimicrobials:  Anti-infectives (From admission, onward)    Start     Dose/Rate Route Frequency Ordered Stop   06/14/22 1200  cefTRIAXone (ROCEPHIN) 1 g in sodium chloride 0.9 % 100 mL IVPB  Status:  Discontinued        1 g 200 mL/hr over 30 Minutes Intravenous Every 24 hours 06/14/22 1048 06/18/22 1428         Subjective: Laying in bed.  Complaining of hemorrhoidal pain.  Complains of diffuse abdominal pain which started this afternoon.  No chest pain.  No shortness of breath.  Objective: Vitals:   06/25/22 0614 06/25/22 0754 06/25/22 1141 06/25/22 1600  BP:  (!) 150/91 (!) 166/94 (!) 158/93  Pulse:  62 70 63  Resp:  '17 18 18  '$ Temp:  98.3 F (36.8 C) 97.8 F (36.6 C) 98 F (36.7 C)  TempSrc:  Oral Oral Oral  SpO2:  95% 97% 98%  Weight: 117.5 kg     Height:        Intake/Output Summary (Last 24 hours) at 06/25/2022 1757 Last data filed at 06/25/2022 1520 Gross per 24 hour   Intake 676 ml  Output 2300 ml  Net -1624 ml   Filed Weights   06/24/22 0433 06/24/22 0729 06/25/22 0614  Weight: 120.1 kg 117.6 kg 117.5 kg    Examination:  General exam: NAD. Respiratory system: CTA B.  No wheezes, no crackles, no rhonchi.  Fair air movement.  Speaking in full sentences.   Cardiovascular system: Regular rate rhythm no murmurs rubs or gallops.  No JVD.  Bilateral lower extremity edema.  Gastrointestinal system: Abdomen is soft, diffuse tenderness to palpation, nondistended, positive bowel sounds.  No rebound.  No guarding.  Central nervous system: Alert and oriented.  Moving extremities spontaneously.  No focal neurological deficits. Extremities: Symmetric 5 x 5 power. Skin: No rashes, lesions or ulcers Psychiatry: Judgement and insight appear normal. Mood & affect appropriate.     Data Reviewed:   CBC: Recent Labs  Lab 06/21/22 0314 06/22/22 0556 06/23/22 0521 06/24/22 0551 06/25/22 0500  WBC 13.2* 11.6* 9.1 8.9 8.3  NEUTROABS  --   --   --   --  4.0  HGB 7.6* 7.0* 7.8* 7.7* 10.8*  HCT 24.4* 22.4* 24.4* 24.1* 32.7*  MCV 94.6 94.1 93.8 95.3 92.9  PLT 277 226 218 208 412    Basic Metabolic Panel: Recent Labs  Lab 06/21/22 0314 06/22/22 0556 06/23/22 0521 06/24/22 0551 06/25/22 0500  NA 141 139 142 142 140  K 3.9 3.3* 3.4* 3.5 4.0  CL 111 109 111 108 108  CO2 21* '24 25 25 26  '$ GLUCOSE 143* 135* 119* 125* 117*  BUN '13 13 8 10 9  '$ CREATININE 0.80 0.96 0.98 0.96 0.95  CALCIUM 8.4* 8.6* 8.6* 8.2* 8.5*  MG  --  1.3* 2.2 1.8  --     GFR: Estimated Creatinine Clearance: 83.1 mL/min (by C-G formula based on SCr of 0.95 mg/dL).  Liver Function Tests: Recent Labs  Lab 06/22/22 0556 06/25/22 0500  AST 18 19  ALT 11 12  ALKPHOS 95 98  BILITOT 0.3 0.7  PROT 5.6* 6.3*  ALBUMIN 2.5* 2.8*    CBG: Recent Labs  Lab 06/20/22 0614 06/21/22 0640 06/23/22 2119 06/24/22 0612 06/25/22 0608  GLUCAP 139* 128* 141* 116* 105*     Recent  Results (from the past 240 hour(s))  Urine Culture     Status: Abnormal   Collection Time: 06/22/22  2:08 PM   Specimen: Urine, Clean Catch  Result Value Ref Range Status   Specimen Description URINE, CLEAN CATCH  Final   Special Requests   Final    NONE Performed at [facility], [place] [address] [place], [place] [address]    Culture MULTIPLE SPECIES PRESENT, SUGGEST RECOLLECTION (A)  Final   Report Status 06/24/2022 FINAL  Final         Radiology Studies: No results found.      Scheduled Meds:  sodium chloride  250 mL Intravenous Once   buPROPion  150 mg  Oral Daily   busPIRone  15 mg Oral TID   Chlorhexidine Gluconate Cloth  6 each Topical Daily   citalopram  40 mg Oral Daily   dicyclomine  20 mg Oral TID AC & HS   diphenoxylate-atropine  2 tablet Oral QID   enoxaparin (LOVENOX) injection  100 mg Subcutaneous BID   famotidine  20 mg Oral BID   hydrocortisone   Rectal QID   loperamide  4 mg Oral Once   meclizine  25 mg Oral TID   multivitamin with minerals  1 tablet Oral Daily   oxyCODONE  10 mg Oral Q12H   pantoprazole  40 mg Oral BID AC   propranolol  20 mg Oral BID   sodium chloride flush  10-40 mL Intracatheter Q12H   sodium chloride flush  3 mL Intravenous Q12H   sucralfate  1 g Oral Q6H   Continuous Infusions:  sodium chloride 10 mL/hr at 06/17/22 2348   promethazine (PHENERGAN) injection (IM or IVPB) 6.25 mg (06/22/22 2157)     LOS: 11 days    Time spent: 40 minutes    Irine Seal, MD [facility]   To contact the attending provider between 7A-7P or the covering provider during after hours 7P-7A, please log into the web site [email or web address] and access using universal  password for that web site. If you do not have the password, please call the hospital operator.  06/25/2022, 5:57 PM

## 2022-06-25 NOTE — Plan of Care (Signed)

## 2022-06-25 NOTE — Progress Notes (Signed)
ANTICOAGULATION CONSULT NOTE - Follow Up Consult  Pharmacy Consult for Enoxaparin Indication:  VTE  Allergies  Allergen Reactions   Penicillins Shortness Of Breath    Other reaction(s): Irregular Heart Rate, Other (See Comments) Rapid heartrate    Alprazolam Hives and Other (See Comments)    Hard to arouse, unresponsiveness   Ativan [Lorazepam] Other (See Comments)    Note: tolerates midazolam fine Face & Throat Swelling.   Corticosteroids Other (See Comments)    Other reaction(s): Other (see comments) Psychotic behaviour     Erythromycin     Other reaction(s): Other (See Comments) Severe stomach pain    Prednisone Other (See Comments)    Anxiety & Nervous Breakdown.   Savella  [Milnacipran]    Prednisolone Anxiety    Patient Measurements: Height: '5\' 6"'$  (167.6 cm) Weight: 117.5 kg (259 lb 0.7 oz) IBW/kg (Calculated) : 59.3 Heparin Dosing Weight: 84.7 kg  Vital Signs: Temp: 97.8 F (36.6 C) (09/02 1141) Temp Source: Oral (09/02 1141) BP: 166/94 (09/02 1141) Pulse Rate: 70 (09/02 1141)  Labs: Recent Labs    06/23/22 0521 06/24/22 0551 06/25/22 0500  HGB 7.8* 7.7* 10.8*  HCT 24.4* 24.1* 32.7*  PLT 218 208 219  CREATININE 0.98 0.96 0.95     Estimated Creatinine Clearance: 83.1 mL/min (by C-G formula based on SCr of 0.95 mg/dL).   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 Pt started on IV heparin for extensive DVTs and now being transition to enoxaparin per heme oncs recommendations.  Hgb today improved up to 10.8, after PRBCs yesterday, plt stable. Scr stable at 0.95 (CrCl 83 mL/min). No listed s/sx of bleeding.  Goal of Therapy:  Anti-Xa level 0.6-1 units/ml 4hrs after LMWH dose given Monitor platelets by anticoagulation protocol: Yes   Plan:  Continue enoxaparin to 100 mg Q12H SQ  Daily CBC, Continue to monitor H&H    Antonietta Jewel, PharmD, [place] Pharmacist  Phone: [phone] 06/25/2022 3:03 PM  Please check AMION for all [facility] phone numbers After 10:00 PM, call [place] [phone]

## 2022-06-25 NOTE — Assessment & Plan Note (Signed)
-   Patient with complaints of hemorrhoidal pain with some blood noted on toilet tissue. -Place on Anusol cream 4 times daily.

## 2022-06-26 DIAGNOSIS — E1165 Type 2 diabetes mellitus with hyperglycemia: Secondary | ICD-10-CM

## 2022-06-26 DIAGNOSIS — K649 Unspecified hemorrhoids: Secondary | ICD-10-CM

## 2022-06-26 LAB — CBC
HCT: 32.9 % — ABNORMAL LOW (ref 36.0–46.0)
Hemoglobin: 10.6 g/dL — ABNORMAL LOW (ref 12.0–15.0)
MCH: 30.3 pg (ref 26.0–34.0)
MCHC: 32.2 g/dL (ref 30.0–36.0)
MCV: 94 fL (ref 80.0–100.0)
Platelets: 204 10*3/uL (ref 150–400)
RBC: 3.5 MIL/uL — ABNORMAL LOW (ref 3.87–5.11)
RDW: 21 % — ABNORMAL HIGH (ref 11.5–15.5)
WBC: 8 10*3/uL (ref 4.0–10.5)
nRBC: 2.5 % — ABNORMAL HIGH (ref 0.0–0.2)

## 2022-06-26 LAB — VITAMIN B1: Vitamin B1 (Thiamine): 78.5 nmol/L (ref 66.5–200.0)

## 2022-06-26 LAB — GLUCOSE, CAPILLARY
Glucose-Capillary: 129 mg/dL — ABNORMAL HIGH (ref 70–99)
Glucose-Capillary: 122 mg/dL — ABNORMAL HIGH (ref 70–99)

## 2022-06-26 LAB — BASIC METABOLIC PANEL
Anion gap: 8 (ref 5–15)
BUN: 11 mg/dL (ref 6–20)
CO2: 30 mmol/L (ref 22–32)
Calcium: 8.5 mg/dL — ABNORMAL LOW (ref 8.9–10.3)
Chloride: 103 mmol/L (ref 98–111)
Creatinine, Ser: 1.05 mg/dL — ABNORMAL HIGH (ref 0.44–1.00)
GFR, Estimated: >60 mL/min (ref >60)
Glucose, Bld: 125 mg/dL — ABNORMAL HIGH (ref 70–99)
Potassium: 3.7 mmol/L (ref 3.5–5.1)
Sodium: 141 mmol/L (ref 135–145)

## 2022-06-26 LAB — MAGNESIUM: Magnesium: 1.7 mg/dL (ref 1.7–2.4)

## 2022-06-26 MED ORDER — MAGNESIUM SULFATE 4 GM/100ML IV SOLN
4.0000 g | Freq: Once | INTRAVENOUS | Status: AC
  Administered 2022-06-26: 4 g via INTRAVENOUS
  Filled 2022-06-26: qty 100

## 2022-06-26 MED ORDER — HEPARIN SOD (PORK) LOCK FLUSH 100 UNIT/ML IV SOLN
500.0000 [IU] | INTRAVENOUS | Status: AC | PRN
  Administered 2022-06-26: 500 [IU]

## 2022-06-26 MED ORDER — HYDROCORTISONE (PERIANAL) 2.5 % EX CREA: TOPICAL_CREAM | Freq: Four times a day (QID) | CUTANEOUS | 1 refills | Status: DC | PRN

## 2022-06-26 NOTE — TOC Transition Note (Signed)
Transition of Care (TOC) - CM/SW Discharge Note Marvetta Gibbons RN, BSN Transitions of Care Unit 4E- RN Case Manager See Treatment Team for direct phone #    Patient Details  Name: Wendy Barber MRN: [id number] Date of Birth: July 12, 1961  Transition of Care [facility]) CM/SW Contact:  Dawayne Patricia, RN Phone Number: 06/26/2022, 2:52 PM   Clinical Narrative:    Pt stable for transition home today, per PT eval no f/u recommendations for ongoing therapy needs. Pt to return home, has assistance from family. No DME needs noted for discharge.   No further TOC needs noted.      Barriers to Discharge: Barriers Resolved   Patient Goals and CMS Choice Patient states their goals for this hospitalization and ongoing recovery are:: to go home CMS Medicare.gov Compare Post Acute Care list provided to:: Patient Choice offered to / list presented to : NA  Discharge Placement                 Home      Discharge Plan and Services     Post Acute Care Choice: [place], Resumption of Svcs/PTA Provider          DME Arranged: N/A DME Agency: NA       HH Arranged: NA HH Agency: [facility] (active with [facility])        Social Determinants of Health (SDOH) Interventions     Readmission Risk Interventions    06/26/2022    2:52 PM 06/01/2022    3:59 PM 05/05/2022    8:39 AM  Readmission Risk Prevention Plan  Transportation Screening Complete Complete Complete  PCP or Specialist Appt within 5-7 Days   Complete  Home Care Screening   Complete  Medication Review (RN CM)   Complete  Medication Review [employer]) Complete Complete   [place] or Home Care Consult Complete Complete   SW Recovery Care/Counseling Consult Complete Complete   Palliative Care Screening Not Applicable Not [place] Not Applicable Not Applicable

## 2022-06-26 NOTE — Discharge Summary (Signed)
Physician Discharge Summary  Wendy Barber DJM:[id number] DOB: [birth year] DOA: 06/13/2022  PCP: [name], MD  Admit date: 06/13/2022 Discharge date: 06/26/2022  Time spent: 60 minutes  Recommendations for Outpatient Follow-up:  Follow-up with Dr. [name], IR.  Office will call with appointment time. Follow-up with Dr. [name], oncology in 1 week.  On follow-up patient will need a CBC with differential, comprehensive metabolic profile, magnesium level checked to follow-up on electrolytes, renal function, H&H.  Further management of bilateral DVTs will need to be done per oncology.  Patient being discharged on full dose Lovenox. Follow-up with [name], MD in 2 weeks.     Discharge Diagnoses:  Principal Problem:   DVT, bilateral lower limbs (HCC) Active Problems:   UTI (urinary tract infection)   AKI (acute kidney injury) ([place])   CAD (coronary artery disease)   DM2 (diabetes mellitus, type 2) (HCC)   HTN (hypertension)   Iron deficiency anemia due to chronic blood loss   Anxiety   Gastritis   Depression   Class 3 obesity (HCC)   Hypomagnesemia   Iron deficiency anemia   Occasional tremors: Essential tremors   Hemorrhoids   Discharge Condition: Stable and improved  Diet recommendation: Regular  Filed Weights   06/24/22 0729 06/25/22 0614 06/26/22 0535  Weight: 117.6 kg 117.5 kg 112.9 kg    History of present illness:  HPI per Dr. Levonne Barber is a 61 y.o. 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  She also had a syncopal episode this morning while on the toilet in the emergency  department.  She lost consciousness for maybe 30 to 45 seconds.  She felt that everything was blacking out before passing out.  She has had some dyspnea, dizziness, but no chest pain, palpitations, PND orthopnea.  She occasionally gets lower extremity edema.  She had a low-grade temperature earlier, but no rhinorrhea, sore throat or clinically significant productive cough.  She has chronic abdominal pain and diarrhea 6-8 times daily at least.  No melena, hematochezia or constipation recently.  No flank pain, dysuria, frequency or hematuria.  No polyuria, polydipsia, polyphagia or blurred vision.   ED course: Initial vital signs were temperature 99.6 F, pulse 73, respiration 19, BP 128/91 mmHg.  The patient received 2 500 mL NS bolus in the emergency department.   Lab work: CBC is her white count 15.0, hemoglobin 8.8 g/dL platelets 185.  CMP showed a CO2 of 19 mmol/L with a normal anion gap, the rest of the electrolytes were normal.  Glucose 148, BUN 42 and creatinine 2.03 mg/dL.  Her baseline creatinine is usually under 1.  LFTs show total protein of 8.3 g/dL and alkaline phosphatase 154 units/L.  Total bilirubin less than 0.1 mg/dL.  The rest of the LFTs were normal.   Imaging: CT abdomen/pelvis with suspicions for bilateral pelvic and femoral venous thrombosis.  There was extensive surgical changes throughout the abdomen without specific evidence of metastatic disease.  There is liver cirrhosis.  There is advanced for age CAD and aortic atherosclerosis.   Hospital Course:   Assessment and Plan: * DVT, bilateral lower limbs (HCC) Extensive deep vein thrombosis, bilateral lower extremities and inferior vena cava. IVC  filter.  08/28 IVC and bilateral lower extremity venogram and mechanical thrombolysis L IJ port catheter placement.   Patient was on heparin IV for anticoagulation, and transitioned subsequently to full dose enoxaparin.   Patient with persistent pain in her lower extremities and lower  abdomen.  Patient was maintained on home regimen long acting oxycodone 10 mg bid, continue with short acting oxycodone 5 mg prn, and IV hydromorphone PRN.  Scheduled acetaminophen. Patient had side effects to gabapentin in the past.   Avoid non steroidal anti inflammatory medications due to full anticoagulation.   -IR had planned to remove IVC filter on 06/22/2022 however, procedure canceled per IR has residual clot noted in the femoral vein, at this time no plans to remove IVC filter at this time during this hospitalization. -Outpatient follow-up with IR.  UTI (urinary tract infection) Status post 5-day course of IV Rocephin.   -Patient with some complaints of dysuria although improved and as such repeat urinalysis was done with trace leukocytes, nitrite negative, 6-10 WBCs.   -Urine cultures with multiple species. -No need for antibiotics at this time.  AKI (acute kidney injury) ([place]) Hypokalemia,   Renal failure has resolved, session by day of discharge creatinine was down to 1.05, potassium at 3.7, bicarb of 30.  -Outpatient follow-up.    CAD (coronary artery disease) Patient maintained on antiplatelet therapy.   DM2 (diabetes mellitus, type 2) (HCC) Glucose has been well controlled, holding on insulin therapy. Fasting glucose is 105 mg/dl today.  Outpatient follow-up.  HTN (hypertension) BP controlled with systolic blood pressures ranging from 130-150s.   -Patient also with complaints of abdominal pain which was likely secondary to bilateral DVTs.. -Patient placed back on home regimen of propranolol.   -Outpatient follow-up.   Iron deficiency anemia due to chronic blood loss -Status post IV iron.  -Hemoglobin stabilized at 10.6 by day of discharge.  -Patient with no significant overt bleeding noted.  -Anemia in part likely due to procedures done.  -Status post 5 units packed red blood cells during this hospitalization.   -Hemoglobin stabilized at 10.6 by day of  discharge. -Outpatient follow-up with hematology.  Anxiety - Patient maintained on home regimen bupropion, BuSpar, citalopram.  Gastritis Patient maintained on antiacid therapy with pantoprazole, famotidine and sucralfate. As needed antiemetics and was tolerating soft diet by day of discharge.   -Outpatient follow-up with PCP.   Depression Patient maintained on home regimen bupropion, citalopram and buspirone.   Class 3 obesity (HCC) BMI 40.1 Lifestyle modification Outpatient follow-up with PCP.  Hemorrhoids - Patient with complaints of hemorrhoidal pain with some blood noted on toilet tissue. -Patient placed on Anusol cream 4 times daily with clinical improvement and will be discharged on Anusol cream 4 times daily x5 to 7 days and then as needed.   -Outpatient follow-up with PCP.    Occasional tremors: Essential tremors - Patient with history of essential tremors. -Patient's home regimen of propranolol initially held and resumed during the hospitalization.  -Outpatient follow-up.  Hypomagnesemia - Magnesium at 1.7 from 1.8 from 2.2 from 1.3 after magnesium sulfate 6 g IV given on 06/22/2022. -Patient received magnesium sulfate 2 g IV on day of discharge. -Outpatient follow-up.        Procedures: CT abdomen and pelvis 06/13/2022 Abdominal films 06/15/2022 Bilateral lower extremity Dopplers 06/14/2022 CT L-spine 06/13/2022 CT venogram abdomen and pelvis 06/15/2022 Tunneled Port-A-Cath placement with ultrasound and fluoroscopic guidance per IR 06/20/2022, Dr. Jarvis Newcomer Ultrasound renal artery duplex 06/15/2022 IVC and bilateral lower extremity  venogram and mechanical thrombolysis/left IJ Port-A-Cath placement by IR, Dr. Vernard Gambles 06/20/2022 Transfusion 3 units packed red blood cells Transfusion 2 units packed red blood cells pending 06/24/2022    Consultations: IR: Dr. [name] 06/21/2022 Hematology/oncology: Dr. [name] 06/14/2022  Discharge Exam: Vitals:   06/26/22 0535 06/26/22  0757  BP: (!) 149/93 (!) 153/82  Pulse: 72 65  Resp: 18 16  Temp: 98.2 F (36.8 C) (!) 95.3 F (35.2 C)  SpO2: 100% 99%    General: NAD Cardiovascular: RRR no murmurs rubs or gallops.  No JVD.  Respiratory: Clear to auscultation bilaterally.  No wheezes, no crackles, no rhonchi.  Discharge Instructions   Discharge Instructions     Type and screen   Complete by: Jun 24, 2022    [place] order/instruction   Complete by: As directed    Transfuse Parameters   Diet general   Complete by: As directed    Increase activity slowly   Complete by: As directed    No wound care   Complete by: As directed       Allergies as of 06/26/2022       Reactions   Penicillins Shortness Of Breath   Other reaction(s): Irregular Heart Rate, Other (See Comments) Rapid heartrate   Alprazolam Hives, Other (See Comments)   Hard to arouse, unresponsiveness   Ativan [lorazepam] Other (See Comments)   Note: tolerates midazolam fine Face & Throat Swelling.   Corticosteroids Other (See Comments)   Other reaction(s): Other (see comments) Psychotic behaviour   Erythromycin    Other reaction(s): Other (See Comments) Severe stomach pain   Prednisone Other (See Comments)   Anxiety & Nervous Breakdown.   Savella  [milnacipran]    Prednisolone Anxiety        Medication List     TAKE these medications    alum & mag hydroxide-simeth 200-200-20 MG/5ML suspension Commonly known as: MAALOX/MYLANTA Take 15 mLs by mouth every 6 (six) hours as needed for indigestion or heartburn.   buPROPion 150 MG 24 hr tablet Commonly known as: Wellbutrin XL Take 1 tablet (150 mg total) by mouth daily.   busPIRone 15 MG tablet Commonly known as: BUSPAR Take 1 tablet (15 mg total) by mouth 3 (three) times daily.   citalopram 40 MG tablet Commonly known as: CeleXA Take 1 tablet (40 mg total) by mouth daily.   diazepam 5 MG tablet Commonly known as: VALIUM Take 1 tablet 30-40  minutes prior to MRI   dicyclomine 20 MG tablet Commonly known as: BENTYL Take 1 tablet (20 mg total) by mouth in the morning, at noon, in the evening, and at bedtime.   diphenhydrAMINE 25 MG tablet Commonly known as: SOMINEX Take 25 mg by mouth daily as needed for itching.   diphenoxylate-atropine 2.5-0.025 MG tablet Commonly known as: LOMOTIL TAKE 2 TABLETS IN THE MORNING, AT NOON, IN THE EVENING AND AT BEDTIME What changed: See the new instructions.   enoxaparin 100 MG/ML injection Commonly known as: LOVENOX Inject 1 mL (100 mg total) into the skin 2 (two) times daily.   eszopiclone 2 MG Tabs tablet Commonly known as: LUNESTA TAKE 1 TABLET(2 MG) BY MOUTH AT BEDTIME AS NEEDED FOR SLEEP What changed: See the new instructions.   famotidine 20 MG tablet Commonly known as: PEPCID TAKE 2 TABLETS TWICE A DAY   ferrous sulfate 325 (65 FE) MG tablet Take 1 tablet (325 mg total) by mouth daily.   folic acid 1 MG  tablet Commonly known as: FOLVITE Take 2 tablets (2 mg total) by mouth daily.   hydrocortisone 2.5 % rectal cream Commonly known as: ANUSOL-HC Place rectally 4 (four) times daily as needed for hemorrhoids or anal itching. Use 4 times daily x5 days, then 4 times daily as needed. What changed:  when to take this reasons to take this additional instructions   loperamide 2 MG capsule Commonly known as: IMODIUM Take 2 capsules (4 mg total) by mouth 4 (four) times daily as needed for diarrhea or loose stools.   meclizine 25 MG tablet Commonly known as: ANTIVERT Take 1 tablet (25 mg total) by mouth 3 (three) times daily as needed for dizziness. What changed: when to take this   ondansetron 8 MG tablet Commonly known as: ZOFRAN Take 8 mg by mouth every 8 (eight) hours as needed for nausea.   orphenadrine 100 MG tablet Commonly known as: NORFLEX TAKE 1 TABLET(100 MG) BY MOUTH AT BEDTIME AS NEEDED FOR MUSCLE SPASMS   oxyCODONE 5 MG immediate release tablet Commonly  known as: Roxicodone Take 1 tablet (5 mg total) by mouth every 4 (four) hours as needed for severe pain or breakthrough pain.   pantoprazole 40 MG tablet Commonly known as: PROTONIX Take 40 mg by mouth every morning.   potassium chloride 10 MEQ tablet Commonly known as: KLOR-CON M Take 2 tablets (20 mEq total) by mouth daily.   promethazine 12.5 MG tablet Commonly known as: PHENERGAN Take 1 tablet (12.5 mg total) by mouth 2 (two) times daily as needed. What changed: reasons to take this   propranolol 20 MG tablet Commonly known as: INDERAL Take 1 tablet (20 mg total) by mouth 2 (two) times daily.   traMADol 50 MG tablet Commonly known as: ULTRAM Take 1 tablet (50 mg total) by mouth every 6 (six) hours as needed for moderate pain.       Allergies  Allergen Reactions   Penicillins Shortness Of Breath    Other reaction(s): Irregular Heart Rate, Other (See Comments) Rapid heartrate    Alprazolam Hives and Other (See Comments)    Hard to arouse, unresponsiveness   Ativan [Lorazepam] Other (See Comments)    Note: tolerates midazolam fine Face & Throat Swelling.   Corticosteroids Other (See Comments)    Other reaction(s): Other (see comments) Psychotic behaviour     Erythromycin     Other reaction(s): Other (See Comments) Severe stomach pain    Prednisone Other (See Comments)    Anxiety & Nervous Breakdown.   Savella  [Milnacipran]    Prednisolone Anxiety    Follow-up Information     Sandi Mariscal, MD Follow up.   Specialties: Interventional Radiology, Radiology Why: Schedulers will contact you with date and time of follow-up appointment. Expected in 4-6 weeks. Contact information: [address] [place] [address] [phone]         [name], MD. Schedule an appointment as soon as possible for a visit in 2 week(s).   Specialties: Family Medicine, Sports Medicine Contact information: [address] [place]  [address] [phone]         Volanda Napoleon, MD. Schedule an appointment as soon as possible for a visit in 1 week(s).   Specialty: Oncology Contact information: [address] [place] [address] [phone]                  The results of significant diagnostics from this hospitalization (  including imaging, microbiology, ancillary and laboratory) are listed below for reference.    Significant Diagnostic Studies: IR THROMBECT V[facility] MOD SED  Result Date: 06/21/2022 CLINICAL DATA:  History of IVC filter placed in 2005. Presents with extensive bilateral lower extremity DVT and iliocaval thrombosis EXAM: THROMBOECTOMY MECHANICAL VENOUS; IR ULTRASOUND GUIDANCE VASC ACCESS RIGHT; BILATERAL EXTREMITY VENOGRAPHY; INFERIOR VENA CAVOGRAM; IR ULTRASOUND GUIDANCE VASC ACCESS LEFT ANESTHESIA/SEDATION: Intravenous Fentanyl 540mg and Versed 12.'5mg'$  were administered as conscious sedation during continuous monitoring of the patient's level of consciousness and physiological / cardiorespiratory status by the radiology RN, with a total moderate sedation time of 332 minutes. MEDICATIONS: Lidocaine 1% subcutaneous CONTRAST:  750mOMNIPAQUE IOHEXOL 300 MG/ML SOLN, 2574mMNIPAQUE IOHEXOL 300 MG/ML SOLN PROCEDURE: The procedure, risks (including but not limited to bleeding, infection, organ damage ), benefits, and alternatives were explained to the patient. Questions regarding the procedure were encouraged and answered. The patient understands and consents to the procedure. Patient placed prone. Bilateral popliteal regions prepped and draped in usual sterile fashion. Maximal barrier sterile technique was utilized including caps, mask, sterile gowns, sterile gloves, sterile drape, hand hygiene and skin antiseptic. Under real-time ultrasound guidance, inferior aspect of the left popliteal artery was accessed with a micropuncture set. The catheter was capped and secured with Tegaderm. In  similar fashion, under real-time ultrasound guidance, inferior aspect of the right popliteal artery accessed with a micropuncture set. The catheter was capped and secured with Tegaderm. The patient was repositioned supine. Right neck and bilateral lower extremities prepped and draped in usual sterile fashion as above. The right IJ vein was localized under ultrasound. Under real-time ultrasound guidance, the vessel was accessed with a 21-gauge micropuncture needle, exchanged over a 018 guidewire for a transitional dilator, through which a 035 guidewire was advanced. Catheter was exchanged for a 5 French angiographic catheter advanced into the IVC. Tract was dilated to the to allow placement of the ProTriever sheath into the suprarenal IVC. Aspiration was attempted with little return. Subsequently, the FlowTriever device was coaxially advanced to perform thrombectomy of the IVC around the infrarenal filter, through the infrarenal IVC and into the left common iliac vein. Due to patient discomfort despite sedation and in the face of good hemodynamic stability, this approach was paused and attention turned to the popliteal approach. It became apparent that with changes in patient positioning, the microcatheters had backed out of the popliteal veins, tips no longer in the lumen of the popliteal vein. For this reason, the patient was turned left lateral decubitus and using identical technique as above bilateral micropuncture access to the inferior popliteal veins was again achieved with ultrasound guidance. This time, 35cm sheaths were placed bilaterally before the patient was repositioned supine, the lower extremities again prepped and draped in usual sterile fashion. Using the left popliteal approach, a 5 French angiographic catheter was advanced for venography of the common iliac vein and IVC, demonstrating patent flow channel but significant residual thrombus. Left lower extremity venography demonstrates significant  clearance of thrombus from the left common iliac vein with residual iliofemoral DVT. The right popliteal sheath was exchanged for the FlowTriever device for continued IVC and femoropopliteal venous thrombectomy. The 16 47ench sheath was then placed in the access site. In similar fashion, the left popliteal sheath was exchanged for the FlowTriever device for continued IVC and femoropopliteal venous thrombectomy. Follow-up cavogram from left popliteal approach demonstrates significant clearance of thrombus from the infrarenal IVC, with residual thrombus just cephalad to the tip of the IVC  filter at the level of renal veins. Repeat FlowTriever thrombectomy from a right IJ and popliteal approaches was performed. Right lower extremity venography shows clearance of the central femoral and iliac DVT. Left lower extremity venography shows near complete clearance of iliofemoral DVT. No significant collateral filling was identified. Ultimately, good antegrade flow was established through the IVC and filter with some nonocclusive thrombus at the apex of the filter still evident. The procedure was terminated. The popliteal and right IJ venous sheaths were removed and hemostasis achieved with the aid of 2-0 ethyl pursestring sutures. The patient tolerated the procedure well with no immediate complication. Patient was transferred back to her room. Operators: [place] COMPLICATIONS: None immediate FINDINGS: Initial ultrasound and venography confirmed extensive bilateral lower extremity femoropopliteal DVT, and bilateral occlusive iliocaval thrombus extending above the tip of the extant IVC filter. Filter tilt and caval wall penetration by multiple legs was identified, as had been seen on prior CT. From right IJ and bilateral popliteal venous approach, mechanical venous thrombectomy was performed with restoration of excellent antegrade flow through the bilateral iliofemoral systems and infrarenal IVC. Small amount of  nonocclusive residual thrombus at the apex of the filter was noted. No significant collateral filling was identified. IMPRESSION: 1. Technically successful mechanical venous thrombectomy of bilateral femoropopliteal and iliocaval DVT as above. PLAN: *Maintain anticoagulation *Patient  return to lab at future date for IVC filter retrieval Electronically Signed   By: Lucrezia Europe M.D.   On: 06/21/2022 14:20   IR US Guide Vasc Access Right  Result Date: 06/21/2022 CLINICAL DATA:  History of IVC filter placed in 2005. Presents with extensive bilateral lower extremity DVT and iliocaval thrombosis EXAM: THROMBOECTOMY MECHANICAL VENOUS; IR ULTRASOUND GUIDANCE VASC ACCESS RIGHT; BILATERAL EXTREMITY VENOGRAPHY; INFERIOR VENA CAVOGRAM; IR ULTRASOUND GUIDANCE VASC ACCESS LEFT ANESTHESIA/SEDATION: Intravenous Fentanyl 525mg and Versed 12.'5mg'$  were administered as conscious sedation during continuous monitoring of the patient's level of consciousness and physiological / cardiorespiratory status by the radiology RN, with a total moderate sedation time of 332 minutes. MEDICATIONS: Lidocaine 1% subcutaneous CONTRAST:  777mOMNIPAQUE IOHEXOL 300 MG/ML SOLN, 2558mMNIPAQUE IOHEXOL 300 MG/ML SOLN PROCEDURE: The procedure, risks (including but not limited to bleeding, infection, organ damage ), benefits, and alternatives were explained to the patient. Questions regarding the procedure were encouraged and answered. The patient understands and consents to the procedure. Patient placed prone. Bilateral popliteal regions prepped and draped in usual sterile fashion. Maximal barrier sterile technique was utilized including caps, mask, sterile gowns, sterile gloves, sterile drape, hand hygiene and skin antiseptic. Under real-time ultrasound guidance, inferior aspect of the left popliteal artery was accessed with a micropuncture set. The catheter was capped and secured with Tegaderm. In similar fashion, under real-time ultrasound guidance,  inferior aspect of the right popliteal artery accessed with a micropuncture set. The catheter was capped and secured with Tegaderm. The patient was repositioned supine. Right neck and bilateral lower extremities prepped and draped in usual sterile fashion as above. The right IJ vein was localized under ultrasound. Under real-time ultrasound guidance, the vessel was accessed with a 21-gauge micropuncture needle, exchanged over a 018 guidewire for a transitional dilator, through which a 035 guidewire was advanced. Catheter was exchanged for a 5 French angiographic catheter advanced into the IVC. Tract was dilated to the to allow placement of the ProTriever sheath into the suprarenal IVC. Aspiration was attempted with little return. Subsequently, the FlowTriever device was coaxially advanced to perform thrombectomy of the IVC around the infrarenal filter, through  the infrarenal IVC and into the left common iliac vein. Due to patient discomfort despite sedation and in the face of good hemodynamic stability, this approach was paused and attention turned to the popliteal approach. It became apparent that with changes in patient positioning, the microcatheters had backed out of the popliteal veins, tips no longer in the lumen of the popliteal vein. For this reason, the patient was turned left lateral decubitus and using identical technique as above bilateral micropuncture access to the inferior popliteal veins was again achieved with ultrasound guidance. This time, 35cm sheaths were placed bilaterally before the patient was repositioned supine, the lower extremities again prepped and draped in usual sterile fashion. Using the left popliteal approach, a 5 French angiographic catheter was advanced for venography of the common iliac vein and IVC, demonstrating patent flow channel but significant residual thrombus. Left lower extremity venography demonstrates significant clearance of thrombus from the left common iliac vein  with residual iliofemoral DVT. The right popliteal sheath was exchanged for the FlowTriever device for continued IVC and femoropopliteal venous thrombectomy. The 89 French sheath was then placed in the access site. In similar fashion, the left popliteal sheath was exchanged for the FlowTriever device for continued IVC and femoropopliteal venous thrombectomy. Follow-up cavogram from left popliteal approach demonstrates significant clearance of thrombus from the infrarenal IVC, with residual thrombus just cephalad to the tip of the IVC filter at the level of renal veins. Repeat FlowTriever thrombectomy from a right IJ and popliteal approaches was performed. Right lower extremity venography shows clearance of the central femoral and iliac DVT. Left lower extremity venography shows near complete clearance of iliofemoral DVT. No significant collateral filling was identified. Ultimately, good antegrade flow was established through the IVC and filter with some nonocclusive thrombus at the apex of the filter still evident. The procedure was terminated. The popliteal and right IJ venous sheaths were removed and hemostasis achieved with the aid of 2-0 ethyl pursestring sutures. The patient tolerated the procedure well with no immediate complication. Patient was transferred back to her room. Operators: Leafy Half COMPLICATIONS: None immediate FINDINGS: Initial ultrasound and venography confirmed extensive bilateral lower extremity femoropopliteal DVT, and bilateral occlusive iliocaval thrombus extending above the tip of the extant IVC filter. Filter tilt and caval wall penetration by multiple legs was identified, as had been seen on prior CT. From right IJ and bilateral popliteal venous approach, mechanical venous thrombectomy was performed with restoration of excellent antegrade flow through the bilateral iliofemoral systems and infrarenal IVC. Small amount of nonocclusive residual thrombus at the apex of the filter was  noted. No significant collateral filling was identified. IMPRESSION: 1. Technically successful mechanical venous thrombectomy of bilateral femoropopliteal and iliocaval DVT as above. PLAN: *Maintain anticoagulation *Patient  return to lab at future date for IVC filter retrieval Electronically Signed   By: Lucrezia Europe M.D.   On: 06/21/2022 14:20   IR Veno/Ext/Bi  Result Date: 06/21/2022 CLINICAL DATA:  History of IVC filter placed in 2005. Presents with extensive bilateral lower extremity DVT and iliocaval thrombosis EXAM: THROMBOECTOMY MECHANICAL VENOUS; IR ULTRASOUND GUIDANCE VASC ACCESS RIGHT; BILATERAL EXTREMITY VENOGRAPHY; INFERIOR VENA CAVOGRAM; IR ULTRASOUND GUIDANCE VASC ACCESS LEFT ANESTHESIA/SEDATION: Intravenous Fentanyl 531mg and Versed 12.'5mg'$  were administered as conscious sedation during continuous monitoring of the patient's level of consciousness and physiological / cardiorespiratory status by the radiology RN, with a total moderate sedation time of 332 minutes. MEDICATIONS: Lidocaine 1% subcutaneous CONTRAST:  762mOMNIPAQUE IOHEXOL 300 MG/ML SOLN, 2526mMNIPAQUE IOHEXOL 300  MG/ML SOLN PROCEDURE: The procedure, risks (including but not limited to bleeding, infection, organ damage ), benefits, and alternatives were explained to the patient. Questions regarding the procedure were encouraged and answered. The patient understands and consents to the procedure. Patient placed prone. Bilateral popliteal regions prepped and draped in usual sterile fashion. Maximal barrier sterile technique was utilized including caps, mask, sterile gowns, sterile gloves, sterile drape, hand hygiene and skin antiseptic. Under real-time ultrasound guidance, inferior aspect of the left popliteal artery was accessed with a micropuncture set. The catheter was capped and secured with Tegaderm. In similar fashion, under real-time ultrasound guidance, inferior aspect of the right popliteal artery accessed with a micropuncture  set. The catheter was capped and secured with Tegaderm. The patient was repositioned supine. Right neck and bilateral lower extremities prepped and draped in usual sterile fashion as above. The right IJ vein was localized under ultrasound. Under real-time ultrasound guidance, the vessel was accessed with a 21-gauge micropuncture needle, exchanged over a 018 guidewire for a transitional dilator, through which a 035 guidewire was advanced. Catheter was exchanged for a 5 French angiographic catheter advanced into the IVC. Tract was dilated to the to allow placement of the ProTriever sheath into the suprarenal IVC. Aspiration was attempted with little return. Subsequently, the FlowTriever device was coaxially advanced to perform thrombectomy of the IVC around the infrarenal filter, through the infrarenal IVC and into the left common iliac vein. Due to patient discomfort despite sedation and in the face of good hemodynamic stability, this approach was paused and attention turned to the popliteal approach. It became apparent that with changes in patient positioning, the microcatheters had backed out of the popliteal veins, tips no longer in the lumen of the popliteal vein. For this reason, the patient was turned left lateral decubitus and using identical technique as above bilateral micropuncture access to the inferior popliteal veins was again achieved with ultrasound guidance. This time, 35cm sheaths were placed bilaterally before the patient was repositioned supine, the lower extremities again prepped and draped in usual sterile fashion. Using the left popliteal approach, a 5 French angiographic catheter was advanced for venography of the common iliac vein and IVC, demonstrating patent flow channel but significant residual thrombus. Left lower extremity venography demonstrates significant clearance of thrombus from the left common iliac vein with residual iliofemoral DVT. The right popliteal sheath was exchanged for  the FlowTriever device for continued IVC and femoropopliteal venous thrombectomy. The 71 French sheath was then placed in the access site. In similar fashion, the left popliteal sheath was exchanged for the FlowTriever device for continued IVC and femoropopliteal venous thrombectomy. Follow-up cavogram from left popliteal approach demonstrates significant clearance of thrombus from the infrarenal IVC, with residual thrombus just cephalad to the tip of the IVC filter at the level of renal veins. Repeat FlowTriever thrombectomy from a right IJ and popliteal approaches was performed. Right lower extremity venography shows clearance of the central femoral and iliac DVT. Left lower extremity venography shows near complete clearance of iliofemoral DVT. No significant collateral filling was identified. Ultimately, good antegrade flow was established through the IVC and filter with some nonocclusive thrombus at the apex of the filter still evident. The procedure was terminated. The popliteal and right IJ venous sheaths were removed and hemostasis achieved with the aid of 2-0 ethyl pursestring sutures. The patient tolerated the procedure well with no immediate complication. Patient was transferred back to her room. Operators: [place]: None immediate FINDINGS: Initial ultrasound and venography  confirmed extensive bilateral lower extremity femoropopliteal DVT, and bilateral occlusive iliocaval thrombus extending above the tip of the extant IVC filter. Filter tilt and caval wall penetration by multiple legs was identified, as had been seen on prior CT. From right IJ and bilateral popliteal venous approach, mechanical venous thrombectomy was performed with restoration of excellent antegrade flow through the bilateral iliofemoral systems and infrarenal IVC. Small amount of nonocclusive residual thrombus at the apex of the filter was noted. No significant collateral filling was identified. IMPRESSION: 1.  Technically successful mechanical venous thrombectomy of bilateral femoropopliteal and iliocaval DVT as above. PLAN: *Maintain anticoagulation *Patient  return to lab at future date for IVC filter retrieval Electronically Signed   By: Lucrezia Europe M.D.   On: 06/21/2022 14:20   IR Venocavagram Ivc  Result Date: 06/21/2022 CLINICAL DATA:  History of IVC filter placed in 2005. Presents with extensive bilateral lower extremity DVT and iliocaval thrombosis EXAM: THROMBOECTOMY MECHANICAL VENOUS; IR ULTRASOUND GUIDANCE VASC ACCESS RIGHT; BILATERAL EXTREMITY VENOGRAPHY; INFERIOR VENA CAVOGRAM; IR ULTRASOUND GUIDANCE VASC ACCESS LEFT ANESTHESIA/SEDATION: Intravenous Fentanyl 577mg and Versed 12.'5mg'$  were administered as conscious sedation during continuous monitoring of the patient's level of consciousness and physiological / cardiorespiratory status by the radiology RN, with a total moderate sedation time of 332 minutes. MEDICATIONS: Lidocaine 1% subcutaneous CONTRAST:  759mOMNIPAQUE IOHEXOL 300 MG/ML SOLN, 2531mMNIPAQUE IOHEXOL 300 MG/ML SOLN PROCEDURE: The procedure, risks (including but not limited to bleeding, infection, organ damage ), benefits, and alternatives were explained to the patient. Questions regarding the procedure were encouraged and answered. The patient understands and consents to the procedure. Patient placed prone. Bilateral popliteal regions prepped and draped in usual sterile fashion. Maximal barrier sterile technique was utilized including caps, mask, sterile gowns, sterile gloves, sterile drape, hand hygiene and skin antiseptic. Under real-time ultrasound guidance, inferior aspect of the left popliteal artery was accessed with a micropuncture set. The catheter was capped and secured with Tegaderm. In similar fashion, under real-time ultrasound guidance, inferior aspect of the right popliteal artery accessed with a micropuncture set. The catheter was capped and secured with Tegaderm. The patient  was repositioned supine. Right neck and bilateral lower extremities prepped and draped in usual sterile fashion as above. The right IJ vein was localized under ultrasound. Under real-time ultrasound guidance, the vessel was accessed with a 21-gauge micropuncture needle, exchanged over a 018 guidewire for a transitional dilator, through which a 035 guidewire was advanced. Catheter was exchanged for a 5 French angiographic catheter advanced into the IVC. Tract was dilated to the to allow placement of the ProTriever sheath into the suprarenal IVC. Aspiration was attempted with little return. Subsequently, the FlowTriever device was coaxially advanced to perform thrombectomy of the IVC around the infrarenal filter, through the infrarenal IVC and into the left common iliac vein. Due to patient discomfort despite sedation and in the face of good hemodynamic stability, this approach was paused and attention turned to the popliteal approach. It became apparent that with changes in patient positioning, the microcatheters had backed out of the popliteal veins, tips no longer in the lumen of the popliteal vein. For this reason, the patient was turned left lateral decubitus and using identical technique as above bilateral micropuncture access to the inferior popliteal veins was again achieved with ultrasound guidance. This time, 35cm sheaths were placed bilaterally before the patient was repositioned supine, the lower extremities again prepped and draped in usual sterile fashion. Using the left popliteal approach, a 5 French angiographic catheter  was advanced for venography of the common iliac vein and IVC, demonstrating patent flow channel but significant residual thrombus. Left lower extremity venography demonstrates significant clearance of thrombus from the left common iliac vein with residual iliofemoral DVT. The right popliteal sheath was exchanged for the FlowTriever device for continued IVC and femoropopliteal venous  thrombectomy. The 75 French sheath was then placed in the access site. In similar fashion, the left popliteal sheath was exchanged for the FlowTriever device for continued IVC and femoropopliteal venous thrombectomy. Follow-up cavogram from left popliteal approach demonstrates significant clearance of thrombus from the infrarenal IVC, with residual thrombus just cephalad to the tip of the IVC filter at the level of renal veins. Repeat FlowTriever thrombectomy from a right IJ and popliteal approaches was performed. Right lower extremity venography shows clearance of the central femoral and iliac DVT. Left lower extremity venography shows near complete clearance of iliofemoral DVT. No significant collateral filling was identified. Ultimately, good antegrade flow was established through the IVC and filter with some nonocclusive thrombus at the apex of the filter still evident. The procedure was terminated. The popliteal and right IJ venous sheaths were removed and hemostasis achieved with the aid of 2-0 ethyl pursestring sutures. The patient tolerated the procedure well with no immediate complication. Patient was transferred back to her room. Operators: Leafy Half COMPLICATIONS: None immediate FINDINGS: Initial ultrasound and venography confirmed extensive bilateral lower extremity femoropopliteal DVT, and bilateral occlusive iliocaval thrombus extending above the tip of the extant IVC filter. Filter tilt and caval wall penetration by multiple legs was identified, as had been seen on prior CT. From right IJ and bilateral popliteal venous approach, mechanical venous thrombectomy was performed with restoration of excellent antegrade flow through the bilateral iliofemoral systems and infrarenal IVC. Small amount of nonocclusive residual thrombus at the apex of the filter was noted. No significant collateral filling was identified. IMPRESSION: 1. Technically successful mechanical venous thrombectomy of bilateral  femoropopliteal and iliocaval DVT as above. PLAN: *Maintain anticoagulation *Patient  return to lab at future date for IVC filter retrieval Electronically Signed   By: Lucrezia Europe M.D.   On: 06/21/2022 14:20   IR US Guide Vasc Access Right  Result Date: 06/21/2022 CLINICAL DATA:  History of IVC filter placed in 2005. Presents with extensive bilateral lower extremity DVT and iliocaval thrombosis EXAM: THROMBOECTOMY MECHANICAL VENOUS; IR ULTRASOUND GUIDANCE VASC ACCESS RIGHT; BILATERAL EXTREMITY VENOGRAPHY; INFERIOR VENA CAVOGRAM; IR ULTRASOUND GUIDANCE VASC ACCESS LEFT ANESTHESIA/SEDATION: Intravenous Fentanyl 514mg and Versed 12.'5mg'$  were administered as conscious sedation during continuous monitoring of the patient's level of consciousness and physiological / cardiorespiratory status by the radiology RN, with a total moderate sedation time of 332 minutes. MEDICATIONS: Lidocaine 1% subcutaneous CONTRAST:  78mOMNIPAQUE IOHEXOL 300 MG/ML SOLN, 2539mMNIPAQUE IOHEXOL 300 MG/ML SOLN PROCEDURE: The procedure, risks (including but not limited to bleeding, infection, organ damage ), benefits, and alternatives were explained to the patient. Questions regarding the procedure were encouraged and answered. The patient understands and consents to the procedure. Patient placed prone. Bilateral popliteal regions prepped and draped in usual sterile fashion. Maximal barrier sterile technique was utilized including caps, mask, sterile gowns, sterile gloves, sterile drape, hand hygiene and skin antiseptic. Under real-time ultrasound guidance, inferior aspect of the left popliteal artery was accessed with a micropuncture set. The catheter was capped and secured with Tegaderm. In similar fashion, under real-time ultrasound guidance, inferior aspect of the right popliteal artery accessed with a micropuncture set. The catheter was capped and secured  with Tegaderm. The patient was repositioned supine. Right neck and bilateral lower  extremities prepped and draped in usual sterile fashion as above. The right IJ vein was localized under ultrasound. Under real-time ultrasound guidance, the vessel was accessed with a 21-gauge micropuncture needle, exchanged over a 018 guidewire for a transitional dilator, through which a 035 guidewire was advanced. Catheter was exchanged for a 5 French angiographic catheter advanced into the IVC. Tract was dilated to the to allow placement of the ProTriever sheath into the suprarenal IVC. Aspiration was attempted with little return. Subsequently, the FlowTriever device was coaxially advanced to perform thrombectomy of the IVC around the infrarenal filter, through the infrarenal IVC and into the left common iliac vein. Due to patient discomfort despite sedation and in the face of good hemodynamic stability, this approach was paused and attention turned to the popliteal approach. It became apparent that with changes in patient positioning, the microcatheters had backed out of the popliteal veins, tips no longer in the lumen of the popliteal vein. For this reason, the patient was turned left lateral decubitus and using identical technique as above bilateral micropuncture access to the inferior popliteal veins was again achieved with ultrasound guidance. This time, 35cm sheaths were placed bilaterally before the patient was repositioned supine, the lower extremities again prepped and draped in usual sterile fashion. Using the left popliteal approach, a 5 French angiographic catheter was advanced for venography of the common iliac vein and IVC, demonstrating patent flow channel but significant residual thrombus. Left lower extremity venography demonstrates significant clearance of thrombus from the left common iliac vein with residual iliofemoral DVT. The right popliteal sheath was exchanged for the FlowTriever device for continued IVC and femoropopliteal venous thrombectomy. The 34 French sheath was then placed in the  access site. In similar fashion, the left popliteal sheath was exchanged for the FlowTriever device for continued IVC and femoropopliteal venous thrombectomy. Follow-up cavogram from left popliteal approach demonstrates significant clearance of thrombus from the infrarenal IVC, with residual thrombus just cephalad to the tip of the IVC filter at the level of renal veins. Repeat FlowTriever thrombectomy from a right IJ and popliteal approaches was performed. Right lower extremity venography shows clearance of the central femoral and iliac DVT. Left lower extremity venography shows near complete clearance of iliofemoral DVT. No significant collateral filling was identified. Ultimately, good antegrade flow was established through the IVC and filter with some nonocclusive thrombus at the apex of the filter still evident. The procedure was terminated. The popliteal and right IJ venous sheaths were removed and hemostasis achieved with the aid of 2-0 ethyl pursestring sutures. The patient tolerated the procedure well with no immediate complication. Patient was transferred back to her room. Operators: Leafy Half COMPLICATIONS: None immediate FINDINGS: Initial ultrasound and venography confirmed extensive bilateral lower extremity femoropopliteal DVT, and bilateral occlusive iliocaval thrombus extending above the tip of the extant IVC filter. Filter tilt and caval wall penetration by multiple legs was identified, as had been seen on prior CT. From right IJ and bilateral popliteal venous approach, mechanical venous thrombectomy was performed with restoration of excellent antegrade flow through the bilateral iliofemoral systems and infrarenal IVC. Small amount of nonocclusive residual thrombus at the apex of the filter was noted. No significant collateral filling was identified. IMPRESSION: 1. Technically successful mechanical venous thrombectomy of bilateral femoropopliteal and iliocaval DVT as above. PLAN: *Maintain  anticoagulation *Patient  return to lab at future date for IVC filter retrieval Electronically Signed   By: Keturah Barre  Vernard Gambles M.D.   On: 06/21/2022 14:20   IR US Guide Vasc Access Left  Result Date: 06/21/2022 CLINICAL DATA:  History of IVC filter placed in 2005. Presents with extensive bilateral lower extremity DVT and iliocaval thrombosis EXAM: THROMBOECTOMY MECHANICAL VENOUS; IR ULTRASOUND GUIDANCE VASC ACCESS RIGHT; BILATERAL EXTREMITY VENOGRAPHY; INFERIOR VENA CAVOGRAM; IR ULTRASOUND GUIDANCE VASC ACCESS LEFT ANESTHESIA/SEDATION: Intravenous Fentanyl 541mg and Versed 12.'5mg'$  were administered as conscious sedation during continuous monitoring of the patient's level of consciousness and physiological / cardiorespiratory status by the radiology RN, with a total moderate sedation time of 332 minutes. MEDICATIONS: Lidocaine 1% subcutaneous CONTRAST:  78mOMNIPAQUE IOHEXOL 300 MG/ML SOLN, 2528mMNIPAQUE IOHEXOL 300 MG/ML SOLN PROCEDURE: The procedure, risks (including but not limited to bleeding, infection, organ damage ), benefits, and alternatives were explained to the patient. Questions regarding the procedure were encouraged and answered. The patient understands and consents to the procedure. Patient placed prone. Bilateral popliteal regions prepped and draped in usual sterile fashion. Maximal barrier sterile technique was utilized including caps, mask, sterile gowns, sterile gloves, sterile drape, hand hygiene and skin antiseptic. Under real-time ultrasound guidance, inferior aspect of the left popliteal artery was accessed with a micropuncture set. The catheter was capped and secured with Tegaderm. In similar fashion, under real-time ultrasound guidance, inferior aspect of the right popliteal artery accessed with a micropuncture set. The catheter was capped and secured with Tegaderm. The patient was repositioned supine. Right neck and bilateral lower extremities prepped and draped in usual sterile fashion as  above. The right IJ vein was localized under ultrasound. Under real-time ultrasound guidance, the vessel was accessed with a 21-gauge micropuncture needle, exchanged over a 018 guidewire for a transitional dilator, through which a 035 guidewire was advanced. Catheter was exchanged for a 5 French angiographic catheter advanced into the IVC. Tract was dilated to the to allow placement of the ProTriever sheath into the suprarenal IVC. Aspiration was attempted with little return. Subsequently, the FlowTriever device was coaxially advanced to perform thrombectomy of the IVC around the infrarenal filter, through the infrarenal IVC and into the left common iliac vein. Due to patient discomfort despite sedation and in the face of good hemodynamic stability, this approach was paused and attention turned to the popliteal approach. It became apparent that with changes in patient positioning, the microcatheters had backed out of the popliteal veins, tips no longer in the lumen of the popliteal vein. For this reason, the patient was turned left lateral decubitus and using identical technique as above bilateral micropuncture access to the inferior popliteal veins was again achieved with ultrasound guidance. This time, 35cm sheaths were placed bilaterally before the patient was repositioned supine, the lower extremities again prepped and draped in usual sterile fashion. Using the left popliteal approach, a 5 French angiographic catheter was advanced for venography of the common iliac vein and IVC, demonstrating patent flow channel but significant residual thrombus. Left lower extremity venography demonstrates significant clearance of thrombus from the left common iliac vein with residual iliofemoral DVT. The right popliteal sheath was exchanged for the FlowTriever device for continued IVC and femoropopliteal venous thrombectomy. The 16 73ench sheath was then placed in the access site. In similar fashion, the left popliteal sheath  was exchanged for the FlowTriever device for continued IVC and femoropopliteal venous thrombectomy. Follow-up cavogram from left popliteal approach demonstrates significant clearance of thrombus from the infrarenal IVC, with residual thrombus just cephalad to the tip of the IVC filter at the level of renal veins. Repeat  FlowTriever thrombectomy from a right IJ and popliteal approaches was performed. Right lower extremity venography shows clearance of the central femoral and iliac DVT. Left lower extremity venography shows near complete clearance of iliofemoral DVT. No significant collateral filling was identified. Ultimately, good antegrade flow was established through the IVC and filter with some nonocclusive thrombus at the apex of the filter still evident. The procedure was terminated. The popliteal and right IJ venous sheaths were removed and hemostasis achieved with the aid of 2-0 ethyl pursestring sutures. The patient tolerated the procedure well with no immediate complication. Patient was transferred back to her room. Operators: Leafy Half COMPLICATIONS: None immediate FINDINGS: Initial ultrasound and venography confirmed extensive bilateral lower extremity femoropopliteal DVT, and bilateral occlusive iliocaval thrombus extending above the tip of the extant IVC filter. Filter tilt and caval wall penetration by multiple legs was identified, as had been seen on prior CT. From right IJ and bilateral popliteal venous approach, mechanical venous thrombectomy was performed with restoration of excellent antegrade flow through the bilateral iliofemoral systems and infrarenal IVC. Small amount of nonocclusive residual thrombus at the apex of the filter was noted. No significant collateral filling was identified. IMPRESSION: 1. Technically successful mechanical venous thrombectomy of bilateral femoropopliteal and iliocaval DVT as above. PLAN: *Maintain anticoagulation *Patient  return to lab at future date for IVC  filter retrieval Electronically Signed   By: Lucrezia Europe M.D.   On: 06/21/2022 14:20   IR IMAGING GUIDED PORT INSERTION  Result Date: 06/20/2022 CLINICAL DATA:  History of metastatic appendiceal carcinoma, difficult IV access EXAM: TUNNELED PORT CATHETER PLACEMENT WITH ULTRASOUND AND FLUOROSCOPIC GUIDANCE FLUOROSCOPY: Radiation Exposure Index (as provided by the fluoroscopic device): 866 mGy air Kerma ANESTHESIA/SEDATION: Intravenous Fentanyl 559mg and Versed 12.'5mg'$  were administered as conscious sedation during continuous monitoring of the patient's level of consciousness and physiological / cardiorespiratory status by the radiology RN, with a total moderate sedation time of 332 minutes (performed immediately preceding iliofemoral thrombolysis dictated separately). TECHNIQUE: The procedure, risks, benefits, and alternatives were explained to the patient. Questions regarding the procedure were encouraged and answered. The patient understands and consents to the procedure. Patency of the left IJ vein was confirmed with ultrasound with image documentation. An appropriate skin site was determined. Skin site was marked. Region was prepped using maximum barrier technique including cap and mask, sterile gown, sterile gloves, large sterile sheet, and Chlorhexidine as cutaneous antisepsis. The region was infiltrated locally with 1% lidocaine. Under real-time ultrasound guidance, the left IJ vein was accessed with a 21 gauge micropuncture needle; the needle tip within the vein was confirmed with ultrasound image documentation. Needle was exchanged over a 018 guidewire for transitional dilator, and vascular measurement was performed. A small incision was made on the left anterior chest wall and a subcutaneous pocket fashioned. The power-injectable port was positioned and its catheter tunneled to the left IJ dermatotomy site. The transitional dilator was exchanged over an Amplatz wire for a peel-away sheath, through which  the port catheter, which had been trimmed to the appropriate length, was advanced and positioned under fluoroscopy with its tip at the cavoatrial junction. Spot chest radiograph confirms good catheter position and no pneumothorax. The port was flushed per protocol. The pocket was closed with deep interrupted and subcuticular continuous 3-0 Monocryl sutures. The incisions were covered with Dermabond then covered with a sterile dressing. The patient tolerated the procedure well. COMPLICATIONS: COMPLICATIONS None immediate IMPRESSION: Technically successful left IJ power-injectable port catheter placement. Ready for routine use. Electronically  Signed   By: Lucrezia Europe M.D.   On: 06/20/2022 17:12   VAS US RENAL ARTERY DUPLEX  Result Date: 06/15/2022 ABDOMINAL VISCERAL Patient Name:  Wendy Barber  Date of Exam:   06/15/2022 Medical Rec #: [id number]       Accession #:    [id number] Date of Birth: 1961/01/03       Patient Gender: F Patient Age:   41 years Exam Location:  [facility] Procedure:      VAS US RENAL ARTERY DUPLEX Referring Phys: [id number] Bonnielee Haff -------------------------------------------------------------------------------- Indications: AKI High Risk Factors: Hypertension, Diabetes. Limitations: Air/bowel gas, obesity, patient discomfort and Multiple abdominal surgeries. Comparison Study: No prior studies. Performing Technologist: Oliver Hum RVT  Examination Guidelines: A complete evaluation includes B-mode imaging, spectral Doppler, color Doppler, and power Doppler as needed of all accessible portions of each vessel. Bilateral testing is considered an integral part of a complete examination. Limited examinations for reoccurring indications may be performed as noted.  Duplex Findings: +--------------------+--------+--------+------+------------------+ Mesenteric          PSV cm/sEDV cm/sPlaque     Comments      +--------------------+--------+--------+------+------------------+ Aorta  Mid              76      12                            +--------------------+--------+--------+------+------------------+ Celiac Artery Origin                      Unable to insonate +--------------------+--------+--------+------+------------------+ SMA Origin                                Unable to insonate +--------------------+--------+--------+------+------------------+    +------------------+--------+--------+-------+ Right Renal ArteryPSV cm/sEDV cm/sComment +------------------+--------+--------+-------+ Origin               98      19           +------------------+--------+--------+-------+ Proximal            103      30           +------------------+--------+--------+-------+ Mid                  55      22           +------------------+--------+--------+-------+ Distal               58      14           +------------------+--------+--------+-------+ +-----------------+--------+--------+-------+ Left Renal ArteryPSV cm/sEDV cm/sComment +-----------------+--------+--------+-------+ Origin              68      17           +-----------------+--------+--------+-------+ Proximal            68      20           +-----------------+--------+--------+-------+ Mid                 71      13           +-----------------+--------+--------+-------+ Distal              51      17           +-----------------+--------+--------+-------+ +------------+--------+--------+----+-----------+--------+--------+----+ Right KidneyPSV cm/sEDV cm/sRI  Left KidneyPSV cm/sEDV  cm/sRI   +------------+--------+--------+----+-----------+--------+--------+----+ Upper Pole  43      14      0.68Upper Pole 56      18      0.69 +------------+--------+--------+----+-----------+--------+--------+----+ Mid         53      13      0.75Mid        54      20      0.64 +------------+--------+--------+----+-----------+--------+--------+----+ Lower Pole  56       22      0.60Lower Pole 46      15      0.68 +------------+--------+--------+----+-----------+--------+--------+----+ Hilar       53      20      0.62Hilar      91      28      0.69 +------------+--------+--------+----+-----------+--------+--------+----+ +------------------+----+------------------+----+ Right Kidney          Left Kidney            +------------------+----+------------------+----+ RAR                   RAR                    +------------------+----+------------------+----+ RAR (manual)      1.36RAR (manual)      1.2  +------------------+----+------------------+----+ Cortex                Cortex                 +------------------+----+------------------+----+ Cortex thickness      Corex thickness        +------------------+----+------------------+----+ Kidney length (cm)9.50Kidney length (cm)8.00 +------------------+----+------------------+----+  Summary: Renal:  Right: Normal size right kidney. Normal right Resisitive Index. No        evidence of right renal artery stenosis. RRV flow present. Left:  Normal size of left kidney. Normal left Resistive Index. No        evidence of left renal artery stenosis. LRV flow present.  *See table(s) above for measurements and observations.  Diagnosing physician: Harold Barban MD  Electronically signed by Harold Barban MD on 06/15/2022 at 8:31:54 PM.    Final    CT VENOGRAM ABD/PEL  Result Date: 06/15/2022 CLINICAL DATA:  IVC thrombosis EXAM: CT VENOGRAM ABDOMEN AND PELVIS TECHNIQUE: Multidetector CT imaging of the abdomen and pelvis was performed using the standard protocol during bolus administration of intravenous contrast. Multiplanar reconstructed images and MIPs were obtained and reviewed to evaluate the vascular anatomy. RADIATION DOSE REDUCTION: This exam was performed according to the departmental dose-optimization program which includes automated exposure control, adjustment of the mA and/or kV  according to patient size and/or use of iterative reconstruction technique. CONTRAST:  155m OMNIPAQUE IOHEXOL 350 MG/ML SOLN COMPARISON:  CT abdomen pelvis 06/12/2022 FINDINGS: VASCULAR Abdominal aorta is normal in caliber. Hepatic, portal, and superior mesenteric veins are patent. Infrarenal IVC filter is present. The legs of the filter extend outside the IVC. The filter is thrombosed with small amount of thrombus extending superior to the filter, approximately 2.5 cm superior to the apex of the filter. Main renal veins are patent. The right ovarian vein is dilated with multiple right retroperitoneal varices. Infrarenal IVC is mildly expanded and thrombosed. Bilateral common iliac, external iliac, common femoral, and visualized femoral veins are mildly expanded and thrombosed. Mild fat stranding seen adjacent to the thrombosed venous segments. NON-VASCULAR Lower chest: No acute abnormality. Hepatobiliary: No focal liver abnormality is seen. Status post  cholecystectomy. No biliary dilatation. Pancreas: Unremarkable. No pancreatic ductal dilatation or surrounding inflammatory changes. Spleen: Status post splenectomy. Adrenals/Urinary Tract: Adrenal glands are normal. Interval development of mild right hydronephrosis with transition zone at the ureteropelvic junction. Kidneys and ureters otherwise unremarkable. Bladder is normal. Stomach/Bowel: Moderate distal esophageal wall thickening is new since recent examination from 06/13/2022 and is suspicious for esophagitis.Surgical clips and calcifications again seen along the lesser curvature of the stomach. Mild diffuse distention of the colon with liquid content, consistent with diarrhea. Anastomotic sutures noted at the rectum, cecum, and mid abdomen small bowel. Lymphatic: No enlarged abdominal or pelvic lymph nodes. Reproductive: Status post hysterectomy. No adnexal masses. Other: Mild subcutaneous and edema of the upper thighs bilaterally. Musculoskeletal: Posterior  fusion changes present at L4-L5. Review of the MIP images confirms the above findings. IMPRESSION: 1. Thrombosed inferior vena cava and bilateral common iliac, external iliac, common femoral, and visualized femoral veins. 2. Thrombosed infrarenal IVC filter with small amount of thrombus extending superior to the filter apex. Renal veins are patent. 3. Interval development of mild right hydronephrosis with transition at the ureteropelvic junction. 4. Interval development of moderate distal esophageal wall thickening suspicious for esophagitis. Electronically Signed   By: Miachel Roux M.D.   On: 06/15/2022 12:38   DG Abd Portable 1V  Result Date: 06/15/2022 CLINICAL DATA:  61 year old female with abdominal pain and distension. Appendiceal cancer with metastasis, Pseudomyxoma peritonei. EXAM: PORTABLE ABDOMEN - 1 VIEW COMPARISON:  CT Abdomen and Pelvis 06/13/2022, and earlier. FINDINGS: Portable AP supine views at 0624 hours. Increasing bowel gas since the CT on 06/13/2022 appears to be primarily within the colon. Gas is present in the sigmoid and rectum. No definite dilated small bowel at this time. IVC filter and lower lumbar spine fusion hardware redemonstrated. Stable cholecystectomy clips. Chronic surgical clips in the left mid abdomen and right lower quadrant also. No definite pneumoperitoneum on these supine views. Lumbar spine degeneration with levoconvex scoliosis. No acute osseous abnormality identified. IMPRESSION: Diffusely increased large bowel gas since 06/13/2022 favors Ileus. Distal large bowel obstruction felt less likely, and no definite small bowel dilatation at this time. Electronically Signed   By: Genevie Ann M.D.   On: 06/15/2022 07:17   VAS [place] LOWER EXTREMITY VENOUS (DVT)  Result Date: 06/14/2022  Lower Venous DVT Study Patient Name:  ABBIGAEL DETLEFSEN  Date of Exam:   06/14/2022 Medical Rec #: [id number]       Accession #:    [id number] Date of Birth: 03-06-1961       Patient Gender: F Patient  Age:   69 years Exam Location:  [facility] Procedure:      VAS [place] LOWER EXTREMITY VENOUS (DVT) Referring Phys: Burney Gauze --------------------------------------------------------------------------------  Indications: Pain, and Swelling. Other Indications: Abnormality seen on CT of pelvis. Risk Factors: Hx of DVT & PE (IVC filter in place) Recurrent appendiceal cancer. Anticoagulation: Patient on Xarelto until 2 weeks ago - taken off due to rectal bleeding. Comparison Study: Previous exam ([facility]) on 05/17/21 was negative for DVT &                   positive for SVT in proximal GSV. Performing Technologist: Rogelia Rohrer RVT, RDMS  Examination Guidelines: A complete evaluation includes B-mode imaging, spectral Doppler, color Doppler, and power Doppler as needed of all accessible portions of each vessel. Bilateral testing is considered an integral part of a complete examination. Limited examinations for reoccurring indications may be performed as noted. The  reflux portion of the exam is performed with the patient in reverse Trendelenburg.  +---------+---------------+---------+-----------+----------+-------------------+ RIGHT    CompressibilityPhasicitySpontaneityPropertiesThrombus Aging      +---------+---------------+---------+-----------+----------+-------------------+ CFV      None           No       No                   Acute               +---------+---------------+---------+-----------+----------+-------------------+ SFJ      None                                         Acute               +---------+---------------+---------+-----------+----------+-------------------+ FV Prox  None           No       No                   Acute               +---------+---------------+---------+-----------+----------+-------------------+ FV Mid   None           No       No                   Acute                +---------+---------------+---------+-----------+----------+-------------------+ FV DistalNone           No       No                   Acute               +---------+---------------+---------+-----------+----------+-------------------+ PFV      None           No       No                   Acute               +---------+---------------+---------+-----------+----------+-------------------+ POP      None           No       No                   Acute               +---------+---------------+---------+-----------+----------+-------------------+ PTV                                                   Not well visualized +---------+---------------+---------+-----------+----------+-------------------+ PERO                                                  Not visualized      +---------+---------------+---------+-----------+----------+-------------------+ Gastroc  None           No       No                   Acute               +---------+---------------+---------+-----------+----------+-------------------+  GSV      None           No       No                   Acute               +---------+---------------+---------+-----------+----------+-------------------+ EIV      None           No       No                   Acute               +---------+---------------+---------+-----------+----------+-------------------+   Right Technical Findings: Not visualized segments include peroneal veins.  +---------+---------------+---------+-----------+----------+--------------+ LEFT     CompressibilityPhasicitySpontaneityPropertiesThrombus Aging +---------+---------------+---------+-----------+----------+--------------+ CFV      None           No       No                   Acute          +---------+---------------+---------+-----------+----------+--------------+ SFJ      None                                                         +---------+---------------+---------+-----------+----------+--------------+ FV Prox  None           No       No                   Acute          +---------+---------------+---------+-----------+----------+--------------+ FV Mid   None           No       No                   Acute          +---------+---------------+---------+-----------+----------+--------------+ FV DistalNone           No       No                   Acute          +---------+---------------+---------+-----------+----------+--------------+ PFV      None           No       No                   Acute          +---------+---------------+---------+-----------+----------+--------------+ POP      None           No       No                   Acute          +---------+---------------+---------+-----------+----------+--------------+ PTV      Full                                                        +---------+---------------+---------+-----------+----------+--------------+ PERO     Full                                                        +---------+---------------+---------+-----------+----------+--------------+  Gastroc  None           No       No                   Acute          +---------+---------------+---------+-----------+----------+--------------+ SSV      None           No       No                   Acute          +---------+---------------+---------+-----------+----------+--------------+ EIV      None           No       No                   Acute          +---------+---------------+---------+-----------+----------+--------------+     Summary: BILATERAL: -No evidence of popliteal cyst, bilaterally. -Imaging of bilateral common iliac veins attempted, however due to midline scarring from previous surgeries, poor ultrasound/tissue interface, and bowel gas unable to visualize. A portion of the distal IVC shows evidence on acute DVT. RIGHT: - Findings consistent with acute deep vein  thrombosis involving the right common femoral vein, SF junction, right femoral vein, right proximal profunda vein, right popliteal vein, right gastrocnemius veins, and external iliac vein. - Findings consistent with acute superficial vein thrombosis involving the right great saphenous vein.  LEFT: - Findings consistent with acute deep vein thrombosis involving the left common femoral vein, SF junction, left femoral vein, left proximal profunda vein, left popliteal vein, and left gastrocnemius veins. - Findings consistent with acute superficial vein thrombosis involving the left small saphenous vein.  *See table(s) above for measurements and observations. Electronically signed by Servando Snare MD on 06/14/2022 at 2:11:58 PM.    Final    CT ABDOMEN PELVIS WO CONTRAST  Result Date: 06/13/2022 CLINICAL DATA:  Weakness and near-syncope. Bilateral hip pain. History of appendiceal cancer with metastasis. Pseudomyxoma peritonei. History of pulmonary emboli and deep venous thrombosis. * Tracking Code: BO * EXAM: CT ABDOMEN AND PELVIS WITHOUT CONTRAST TECHNIQUE: Multidetector CT imaging of the abdomen and pelvis was performed following the standard protocol without IV contrast. RADIATION DOSE REDUCTION: This exam was performed according to the departmental dose-optimization program which includes automated exposure control, adjustment of the mA and/or kV according to patient size and/or use of iterative reconstruction technique. COMPARISON:  03/23/2022 PET. 09/29/2021 contrast-enhanced chest abdomen and pelvic CT. FINDINGS: Lower chest: Mild bibasilar scarring. Normal heart size without pericardial or pleural effusion. Lad coronary artery calcification. Hepatobiliary: Advanced cirrhosis, as evidenced by an irregular hepatic capsule. Limited sensitivity for focal liver lesion due to lack of IV contrast. Cholecystectomy, without biliary ductal dilatation. Pancreas: Moderate pancreatic atrophy with fatty replacement in the  head and uncinate process. Spleen: Splenectomy. Adrenals/Urinary Tract: Normal adrenal glands. No renal calculi or hydronephrosis. Decompressed urinary bladder, without bladder calculi. The ureters are somewhat challenging to follow, but no ureteric stones are seen. Stomach/Bowel: Normal stomach, without wall thickening. Surgical sutures at the rectosigmoid junction. Right hemicolectomy. Concurrent right pelvic enterotomy. Bowel is poorly evaluated secondary to lack of oral or IV contrast. Anterior position of bowel loops is relatively similar and suggests adhesions. Vascular/Lymphatic: Aortic atherosclerosis. IVC filter is appropriately positioned, just below the renal veins. Edema is identified about both pelvic and groin venous systems, including on 72 and 73 of series 2. Subtle hypoattenuation within  the left external iliac vein including on 66/2. No abdominopelvic adenopathy. Reproductive: Hysterectomy.  No adnexal mass. Other: No significant free fluid. No free intraperitoneal air. No convincing evidence of peritoneal metastasis. Musculoskeletal: L4-5 trans pedicle screw fixation. Moderate convex left lumbar spine curvature. IMPRESSION: 1. Perivascular edema within both groins and superficial pelvic sidewalls. Suspect bilateral pelvic and femoral venous thrombosis, suboptimally evaluated on this noncontrast exam. IVC filter in place. Ultrasound or dedicated CT venography versus MR venography could confirm. 2. Otherwise, low sensitivity exam secondary to lack of oral or IV contrast. 3. Extensive surgical changes throughout the abdomen, without specific evidence of metastatic disease. 4. Cirrhosis 5. Age advanced coronary artery atherosclerosis. Recommend assessment of coronary risk factors. 6.  Aortic Atherosclerosis (ICD10-I70.0). Electronically Signed   By: Abigail Miyamoto M.D.   On: 06/13/2022 16:38   CT L-SPINE NO CHARGE  Result Date: 06/13/2022 CLINICAL DATA:  Generalized weakness. Bilateral hip pain.  History of metastatic disease EXAM: CT Lumbar Spine without contrast TECHNIQUE: Technique: Multiplanar CT images of the lumbar spine were reconstructed from contemporary CT of the Abdomen and Pelvis. RADIATION DOSE REDUCTION: This exam was performed according to the departmental dose-optimization program which includes automated exposure control, adjustment of the mA and/or kV according to patient size and/or use of iterative reconstruction technique. CONTRAST:  None COMPARISON:  Abdominal CT 09/29/2021 FINDINGS: Segmentation: 5 lumbar type vertebrae based on the available coverage. L5 is incompletely segmented from the sacrum at the left transverse process Alignment: Levoscoliosis. Vertebrae: No evidence of acute fracture or aggressive bone lesion. Paraspinal and other soft tissues: The IVC and left more than right iliac veins appear high-density and expanded with retroperitoneal edema. Partially covered liver shows lobulation from cirrhosis. Disc levels: T12- L1: Unremarkable. L1-L2: Mild disc narrowing L2-L3: Degenerative facet spurring and ligamentum flavum thickening. Disc space narrowing and bulging. Spinal stenosis is at least moderate. Moderate bilateral foraminal narrowing L3-L4: Disc collapse with endplate and facet spurring. At least moderate spinal stenosis. Biforaminal impingement L4-L5: PLIF with solid arthrodesis. Decompression with patent appearance of the canal. Mild to moderate left foraminal narrowing L5-S1:Mild spurring with incomplete segmentation. No bony impingement. IMPRESSION: 1. Dominant findings in the retroperitoneum where there is evidence of confluent thrombus in the iliac veins and IVC below a filter. 2. No acute finding in the lumbar spine. 3. Lumbar spine degeneration and scoliosis with moderate spinal stenosis at L2-3 and L3-4. Electronically Signed   By: Jorje Guild M.D.   On: 06/13/2022 16:25   EEG adult  Result Date: 06/01/2022 Lora Havens, MD     06/02/2022  8:23 AM  Patient Name: Wendy Barber MRN: [id number] Epilepsy Attending: Lora Havens Referring Physician/Provider: Jani Gravel, MD Date: 06/01/2022 Duration: 22.09 mins Patient history: 61 year old female with syncope.  EEG to evaluate for seizure. Level of alertness: Awake AEDs during EEG study: None Technical aspects: This EEG study was done with scalp electrodes positioned according to the 10-20 International system of electrode placement. Electrical activity was reviewed with band pass filter of 1-'70Hz'$ , sensitivity of 7 uV/mm, display speed of 13m/sec with a '60Hz'$  notched filter applied as appropriate. EEG data were recorded continuously and digitally stored.  Video monitoring was available and reviewed as appropriate. Description: The posterior dominant rhythm consists of 8 Hz activity of moderate voltage (25-35 uV) seen predominantly in posterior head regions, symmetric and reactive to eye opening and eye closing. Hyperventilation and photic stimulation were not performed.   IMPRESSION: This study is within normal limits. No seizures  or epileptiform discharges were seen throughout the recording. A normal interictal EEG does not exclude nor support the diagnosis of epilepsy. Lora Havens   ECHOCARDIOGRAM COMPLETE  Result Date: 06/01/2022    ECHOCARDIOGRAM REPORT   Patient Name:   JAELINE WHOBREY Date of Exam: 06/01/2022 Medical Rec #:  [id number]      Height:       66.0 in Accession #:    [id number]     Weight:       238.5 lb Date of Birth:  [birth year]      BSA:          2.155 m Patient Age:    75 years       BP:           116/79 mmHg Patient Gender: F              HR:           85 bpm. Exam Location:  [facility] Procedure: 2D Echo, Cardiac Doppler and Color Doppler Indications:    R55 Syncope  History:        Patient has no prior history of Echocardiogram examinations.                 Risk Factors:Hypertension and Diabetes. 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   Sonographer:    Alvino Chapel RCS Referring Phys: [place]  1. Left ventricular ejection fraction, by estimation, is 60 to 65%. The left ventricle has normal function. The left ventricle has no regional wall motion abnormalities. Left ventricular diastolic parameters were normal.  2. Right ventricular systolic function is normal. The right ventricular size is normal. Tricuspid regurgitation signal is inadequate for assessing PA pressure.  3. A small pericardial effusion is present. The pericardial effusion is circumferential. There is no evidence of cardiac tamponade.  4. The mitral valve is grossly normal. No evidence of mitral valve regurgitation. No evidence of mitral stenosis.  5. The aortic valve is tricuspid. Aortic valve regurgitation is not visualized. No aortic stenosis is present.  6. The inferior vena cava is normal in size with greater than 50% respiratory variability, suggesting right atrial pressure of 3 mmHg. FINDINGS  Left Ventricle: Left ventricular ejection fraction, by estimation, is 60 to 65%. The left ventricle has normal function. The left ventricle has no regional wall motion abnormalities. The left ventricular internal cavity size was normal in size. There is  no left ventricular hypertrophy. Left ventricular diastolic parameters were normal. Right Ventricle: The right ventricular size is normal. No increase in right ventricular wall thickness. Right ventricular systolic function is normal. Tricuspid regurgitation signal is inadequate for assessing PA pressure. Left Atrium: Left atrial size was normal in size. Right Atrium: Right atrial size was normal in size. Pericardium: A small pericardial effusion is present. The pericardial effusion is circumferential. There is no evidence of cardiac tamponade. Mitral Valve: The mitral valve is grossly normal. No evidence of mitral valve regurgitation. No evidence of mitral valve stenosis. Tricuspid Valve: The tricuspid valve is  grossly normal. Tricuspid valve regurgitation is not demonstrated. No evidence of tricuspid stenosis. Aortic Valve: The aortic valve is tricuspid. Aortic valve regurgitation is not visualized. No aortic stenosis is present. Pulmonic Valve: The pulmonic valve was grossly normal. Pulmonic valve regurgitation is trivial. No evidence of pulmonic stenosis. Aorta: The aortic root is normal in size and  structure. Venous: The inferior vena cava is normal in size with greater than 50% respiratory variability, suggesting right atrial pressure of 3 mmHg. IAS/Shunts: The atrial septum is grossly normal.  LEFT VENTRICLE PLAX 2D LVIDd:         4.60 cm   Diastology LVIDs:         2.70 cm   LV e' medial:    5.87 cm/s LV PW:         1.00 cm   LV E/e' medial:  9.0 LV IVS:        1.00 cm   LV e' lateral:   9.79 cm/s LVOT diam:     2.40 cm   LV E/e' lateral: 5.4 LV SV:         67 LV SV Index:   31 LVOT Area:     4.52 cm  RIGHT VENTRICLE RV S prime:     14.80 cm/s TAPSE (M-mode): 2.0 cm LEFT ATRIUM             Index        RIGHT ATRIUM           Index LA diam:        3.70 cm 1.72 cm/m   RA Area:     14.90 cm LA Vol (A2C):   49.8 ml 23.11 ml/m  RA Volume:   38.00 ml  17.63 ml/m LA Vol (A4C):   54.0 ml 25.05 ml/m LA Biplane Vol: 51.9 ml 24.08 ml/m  AORTIC VALVE LVOT Vmax:   82.00 cm/s LVOT Vmean:  55.800 cm/s LVOT VTI:    0.149 m  AORTA Ao Root diam: 3.40 cm MITRAL VALVE MV Area (PHT): 4.49 cm    SHUNTS MV Decel Time: 169 msec    Systemic VTI:  0.15 m MV E velocity: 52.90 cm/s  Systemic Diam: 2.40 cm MV A velocity: 65.50 cm/s MV E/A ratio:  0.81 Eleonore Chiquito MD Electronically signed by Eleonore Chiquito MD Signature Date/Time: 06/01/2022/10:45:58 AM    Final    US Carotid Bilateral  Result Date: 06/01/2022 CLINICAL DATA:  61 year old female with a history of syncope EXAM: BILATERAL CAROTID DUPLEX ULTRASOUND TECHNIQUE: Pearline Cables scale imaging, color Doppler and duplex ultrasound were performed of bilateral carotid and vertebral arteries  in the neck. COMPARISON:  None Available. FINDINGS: Criteria: Quantification of carotid stenosis is based on velocity parameters that correlate the residual internal carotid diameter with NASCET-based stenosis levels, using the diameter of the distal internal carotid lumen as the denominator for stenosis measurement. The following velocity measurements were obtained: RIGHT ICA:  Systolic 77 cm/sec, Diastolic 23 cm/sec CCA:  65 cm/sec SYSTOLIC ICA/CCA RATIO:  1.2 ECA:  17 cm/sec LEFT ICA:  Systolic 66 cm/sec, Diastolic 28 cm/sec CCA:  74 cm/sec SYSTOLIC ICA/CCA RATIO:  0.9 ECA:  72 cm/sec Right Brachial SBP: Not acquired Left Brachial SBP: Not acquired RIGHT CAROTID ARTERY: No significant calcified disease of the right common carotid artery. Intermediate waveform maintained. Homogeneous plaque without significant calcifications at the right carotid bifurcation. Low resistance waveform of the right ICA. No significant tortuosity. RIGHT VERTEBRAL ARTERY: Antegrade flow with low resistance waveform. LEFT CAROTID ARTERY: No significant calcified disease of the left common carotid artery. Intermediate waveform maintained. Homogeneous plaque at the left carotid bifurcation without significant calcifications. Low resistance waveform of the left ICA. LEFT VERTEBRAL ARTERY:  Antegrade flow with low resistance waveform. IMPRESSION: Color duplex indicates minimal homogeneous plaque, with no hemodynamically significant stenosis by duplex criteria in the extracranial cerebrovascular  circulation. Signed, Dulcy Fanny. Nadene Rubins, RPVI [facility] Radiology Electronically Signed   By: Corrie Mckusick D.O.   On: 06/01/2022 09:46   CT HEAD WO CONTRAST (5MM)  Result Date: 06/01/2022 CLINICAL DATA:  Syncope/presyncope, cerebrovascular cause suspected syncope vs seizure EXAM: CT HEAD WITHOUT CONTRAST TECHNIQUE: Contiguous axial images were obtained from the base of the skull through the  vertex without intravenous contrast. RADIATION DOSE REDUCTION: This exam was performed according to the departmental dose-optimization program which includes automated exposure control, adjustment of the mA and/or kV according to patient size and/or use of iterative reconstruction technique. COMPARISON:  None Available. FINDINGS: Brain: Partially calcified mass within the left posterior fossa along the tentorium at the torcula is unchanged from prior examination, better characterized on prior examination as a partially calcified meningioma. No associated abnormal mass effect. No midline shift. No new intra or extra-axial mass lesion or fluid collection. No evidence of acute intracranial hemorrhage or infarct. Ventricular size is normal. Cerebellum is unremarkable. Vascular: No hyperdense vessel or unexpected calcification. Skull: Normal. Negative for fracture or focal lesion. Sinuses/Orbits: No acute finding. Other: There is fluid opacification of several inferior mastoid air cells bilaterally, left greater than right. No associated osseous erosion. Middle ear cavities are clear bilaterally. IMPRESSION: 1. No acute intracranial abnormality. No evidence of acute intracranial hemorrhage or infarct. 2. Stable partially calcified left posterior fossa meningioma. 3. Bilateral mastoid effusions, left greater than right. Electronically Signed   By: Fidela Salisbury M.D.   On: 06/01/2022 00:44   DG Chest Portable 1 View  Result Date: 05/31/2022 CLINICAL DATA:  Syncope, hypotension EXAM: PORTABLE CHEST 1 VIEW COMPARISON:  None Available. FINDINGS: Heart and mediastinal contours are within normal limits. No focal opacities or effusions. No acute bony abnormality. IMPRESSION: No active disease. Electronically Signed   By: Rolm Baptise M.D.   On: 05/31/2022 23:13    Microbiology: Recent Results (from the past 240 hour(s))  Urine Culture     Status: Abnormal   Collection Time: 06/22/22  2:08 PM   Specimen: Urine, Clean  Catch  Result Value Ref Range Status   Specimen Description URINE, CLEAN CATCH  Final   Special Requests   Final    NONE Performed at [facility], [place] [address] [place], [place] [address]    Culture MULTIPLE SPECIES PRESENT, SUGGEST RECOLLECTION (A)  Final   Report Status 06/24/2022 FINAL  Final     Labs: Basic Metabolic Panel: Recent Labs  Lab 06/22/22 0556 06/23/22 0521 06/24/22 0551 06/25/22 0500 06/26/22 0908  NA 139 142 142 140 141  K 3.3* 3.4* 3.5 4.0 3.7  CL 109 111 108 108 103  CO2 '24 25 25 26 30  '$ GLUCOSE 135* 119* 125* 117* 125*  BUN '13 8 10 9 11  '$ CREATININE 0.96 0.98 0.96 0.95 1.05*  CALCIUM 8.6* 8.6* 8.2* 8.5* 8.5*  MG 1.3* 2.2 1.8  --  1.7   Liver Function Tests: Recent Labs  Lab 06/22/22 0556 06/25/22 0500  AST 18 19  ALT 11 12  ALKPHOS 95 98  BILITOT 0.3 0.7  PROT 5.6* 6.3*  ALBUMIN 2.5* 2.8*   No results for input(s): "LIPASE", "AMYLASE" in the last 168 hours. No results for input(s): "AMMONIA" in the last 168 hours. CBC: Recent Labs  Lab 06/22/22 0556 06/23/22 0521 06/24/22 0551 06/25/22 0500 06/26/22 0908  WBC 11.6* 9.1 8.9 8.3 8.0  NEUTROABS  --   --   --  4.0  --   HGB 7.0* 7.8* 7.7* 10.8* 10.6*  HCT 22.4* 24.4* 24.1* 32.7* 32.9*  MCV 94.1 93.8 95.3 92.9 94.0  PLT 226 218 208 219 204   Cardiac Enzymes: No results for input(s): "CKTOTAL", "CKMB", "CKMBINDEX", "TROPONINI" in the last 168 hours. BNP: BNP (last 3 results) No results for input(s): "BNP" in the last 8760 hours.  ProBNP (last 3 results) No results for input(s): "PROBNP" in the last 8760 hours.  CBG: Recent Labs  Lab 06/23/22 2119 06/24/22 0612 06/25/22 0608 06/26/22 0645 06/26/22 1304  GLUCAP 141* 116* 105* 129* 122*       Signed:  Irine Seal MD.  [facility] 06/26/2022, 2:55 PM

## 2022-06-26 NOTE — Plan of Care (Signed)

## 2022-06-26 NOTE — Progress Notes (Signed)
Patient to be discharged home.  Patient to be transported by her husband.  Port deaccessed by IV team.  Discharge instructions and prescription given to the patient who verbalized understanding.

## 2022-06-26 NOTE — Plan of Care (Signed)
  Problem: Education: Goal: Knowledge of General Education information will improve Description: Including pain rating scale, medication(s)/side effects and non-pharmacologic comfort measures 06/26/2022 1459 by Caroll Rancher, RN Outcome: Adequate for Discharge 06/26/2022 1311 by Caroll Rancher, RN Outcome: Progressing   Problem: Health Behavior/Discharge Planning: Goal: Ability to manage health-related needs will improve Outcome: Adequate for Discharge   Problem: Clinical Measurements: Goal: Ability to maintain clinical measurements within normal limits will improve Outcome: Adequate for Discharge Goal: Will remain free from infection Outcome: Adequate for Discharge Goal: Diagnostic test results will improve Outcome: Adequate for Discharge Goal: Respiratory complications will improve 06/26/2022 1459 by Caroll Rancher, RN Outcome: Adequate for Discharge 06/26/2022 1311 by Caroll Rancher, RN Outcome: Progressing Goal: Cardiovascular complication will be avoided Outcome: Adequate for Discharge   Problem: Elimination: Goal: Will not experience complications related to bowel motility Outcome: Adequate for Discharge Goal: Will not experience complications related to urinary retention Outcome: Adequate for Discharge   Problem: Pain Managment: Goal: General experience of comfort will improve 06/26/2022 1459 by Caroll Rancher, RN Outcome: Adequate for Discharge 06/26/2022 1311 by Caroll Rancher, RN Outcome: Progressing   Problem: Safety: Goal: Ability to remain free from injury will improve Outcome: Adequate for Discharge   Problem: Skin Integrity: Goal: Risk for impaired skin integrity will decrease Outcome: Adequate for Discharge   Problem: Education: Goal: Ability to describe self-care measures that may prevent or decrease complications (Diabetes Survival Skills Education) will improve Outcome: Adequate for Discharge Goal:  Individualized Educational Video(s) Outcome: Adequate for Discharge   Problem: Coping: Goal: Ability to adjust to condition or change in health will improve Outcome: Adequate for Discharge   Problem: Fluid Volume: Goal: Ability to maintain a balanced intake and output will improve Outcome: Adequate for Discharge   Problem: Health Behavior/Discharge Planning: Goal: Ability to identify and utilize available resources and services will improve Outcome: Adequate for Discharge Goal: Ability to manage health-related needs will improve Outcome: Adequate for Discharge   Problem: Metabolic: Goal: Ability to maintain appropriate glucose levels will improve Outcome: Adequate for Discharge   Problem: Nutritional: Goal: Maintenance of adequate nutrition will improve Outcome: Adequate for Discharge Goal: Progress toward achieving an optimal weight will improve Outcome: Adequate for Discharge   Problem: Skin Integrity: Goal: Risk for impaired skin integrity will decrease Outcome: Adequate for Discharge   Problem: Tissue Perfusion: Goal: Adequacy of tissue perfusion will improve Outcome: Adequate for Discharge   Problem: Increased Nutrient Needs (NI-5.1) Goal: Food and/or nutrient delivery Description: Individualized approach for food/nutrient provision. Outcome: Adequate for Discharge   Problem: Education: Goal: Understanding of CV disease, CV risk reduction, and recovery process will improve Outcome: Adequate for Discharge Goal: Individualized Educational Video(s) Outcome: Adequate for Discharge   Problem: Activity: Goal: Ability to return to baseline activity level will improve Outcome: Adequate for Discharge   Problem: Cardiovascular: Goal: Ability to achieve and maintain adequate cardiovascular perfusion will improve Outcome: Adequate for Discharge Goal: Vascular access site(s) Level 0-1 will be maintained Outcome: Adequate for Discharge   Problem: Health  Behavior/Discharge Planning: Goal: Ability to safely manage health-related needs after discharge will improve Outcome: Adequate for Discharge

## 2022-06-28 ENCOUNTER — Telehealth: Payer: Self-pay

## 2022-06-28 LAB — PATHOLOGIST SMEAR REVIEW

## 2022-06-28 NOTE — Telephone Encounter (Signed)
Transition Care Management Follow-up Telephone Call Date of discharge and from where: 06/26/2022 How have you been since you were released from the hospital? Still Week  Any questions or concerns? No  Items Reviewed: Did the pt receive and understand the discharge instructions provided? Yes  Medications obtained and verified? Yes  Other? No  Any new allergies since your discharge? No  Dietary orders reviewed? No Do you have support at home? Yes   Home Care and Equipment/Supplies: Were home health services ordered? no If so, what is the name of the agency? N/a  Has the agency set up a time to come to the patient's home? not applicable Were any new equipment or medical supplies ordered?  No What is the name of the medical supply agency? N/a Were you able to get the supplies/equipment? not applicable Do you have any questions related to the use of the equipment or supplies? No  Functional Questionnaire: (I = Independent and D = Dependent) ADLs: D  Bathing/Dressing- I  Meal Prep- D  Eating- I  Maintaining continence- D having issues with Diarrhea   Transferring/Ambulation- I  Managing Meds- D  Follow up appointments reviewed:  PCP Hospital f/u appt confirmed? Yes  Scheduled to see Dr.Greene  on 07/07/2022 @ 120pm. [facility] f/u appt confirmed? No  Scheduled to see Patient to call and schedule   Are transportation arrangements needed? No  If their condition worsens, is the pt aware to call PCP or go to the Emergency Dept.? Yes Was the patient provided with contact information for the PCP's office or ED? Yes Was to pt encouraged to call back with questions or concerns? Yes

## 2022-06-29 ENCOUNTER — Telehealth: Payer: Self-pay | Admitting: *Deleted

## 2022-06-29 ENCOUNTER — Encounter: Payer: Self-pay | Admitting: Hematology & Oncology

## 2022-06-29 LAB — ZINC: Zinc: 68 ug/dL (ref 44–115)

## 2022-06-29 NOTE — Telephone Encounter (Signed)
Per results message Wendy Barber - called and gave upcoming appointments - confirmed

## 2022-06-30 ENCOUNTER — Encounter: Payer: Self-pay | Admitting: Hematology & Oncology

## 2022-06-30 ENCOUNTER — Encounter: Payer: Self-pay | Admitting: Family Medicine

## 2022-06-30 ENCOUNTER — Inpatient Hospital Stay: Payer: Medicare Other | Attending: Hematology & Oncology

## 2022-06-30 ENCOUNTER — Telehealth: Payer: Self-pay | Admitting: *Deleted

## 2022-06-30 ENCOUNTER — Inpatient Hospital Stay: Payer: Medicare Other

## 2022-06-30 ENCOUNTER — Inpatient Hospital Stay (HOSPITAL_BASED_OUTPATIENT_CLINIC_OR_DEPARTMENT_OTHER): Payer: Medicare Other | Admitting: Hematology & Oncology

## 2022-06-30 VITALS — BP 159/86 | HR 69 | Temp 98.4°F | Resp 17 | Wt 236.0 lb

## 2022-06-30 DIAGNOSIS — Z7901 Long term (current) use of anticoagulants: Secondary | ICD-10-CM | POA: Insufficient documentation

## 2022-06-30 DIAGNOSIS — R109 Unspecified abdominal pain: Secondary | ICD-10-CM | POA: Diagnosis not present

## 2022-06-30 DIAGNOSIS — M7989 Other specified soft tissue disorders: Secondary | ICD-10-CM | POA: Diagnosis not present

## 2022-06-30 DIAGNOSIS — R5383 Other fatigue: Secondary | ICD-10-CM | POA: Diagnosis not present

## 2022-06-30 DIAGNOSIS — D510 Vitamin B12 deficiency anemia due to intrinsic factor deficiency: Secondary | ICD-10-CM | POA: Diagnosis not present

## 2022-06-30 DIAGNOSIS — M549 Dorsalgia, unspecified: Secondary | ICD-10-CM | POA: Insufficient documentation

## 2022-06-30 DIAGNOSIS — F32A Depression, unspecified: Secondary | ICD-10-CM | POA: Insufficient documentation

## 2022-06-30 DIAGNOSIS — M79606 Pain in leg, unspecified: Secondary | ICD-10-CM | POA: Diagnosis not present

## 2022-06-30 DIAGNOSIS — I82412 Acute embolism and thrombosis of left femoral vein: Secondary | ICD-10-CM

## 2022-06-30 DIAGNOSIS — C181 Malignant neoplasm of appendix: Secondary | ICD-10-CM | POA: Insufficient documentation

## 2022-06-30 DIAGNOSIS — K55059 Acute (reversible) ischemia of intestine, part and extent unspecified: Secondary | ICD-10-CM | POA: Diagnosis not present

## 2022-06-30 DIAGNOSIS — R300 Dysuria: Secondary | ICD-10-CM | POA: Insufficient documentation

## 2022-06-30 DIAGNOSIS — R197 Diarrhea, unspecified: Secondary | ICD-10-CM | POA: Insufficient documentation

## 2022-06-30 DIAGNOSIS — Z79899 Other long term (current) drug therapy: Secondary | ICD-10-CM | POA: Insufficient documentation

## 2022-06-30 DIAGNOSIS — R58 Hemorrhage, not elsewhere classified: Secondary | ICD-10-CM | POA: Insufficient documentation

## 2022-06-30 DIAGNOSIS — I82413 Acute embolism and thrombosis of femoral vein, bilateral: Secondary | ICD-10-CM | POA: Diagnosis not present

## 2022-06-30 LAB — CBC WITH DIFFERENTIAL (CANCER CENTER ONLY)
Abs Immature Granulocytes: 0.02 K/uL (ref 0.00–0.07)
Basophils Absolute: 0.1 K/uL (ref 0.0–0.1)
Basophils Relative: 1 %
Eosinophils Absolute: 0.3 K/uL (ref 0.0–0.5)
Eosinophils Relative: 5 %
HCT: 37.1 % (ref 36.0–46.0)
Hemoglobin: 11.6 g/dL — ABNORMAL LOW (ref 12.0–15.0)
Immature Granulocytes: 0 %
Lymphocytes Relative: 40 %
Lymphs Abs: 2.7 K/uL (ref 0.7–4.0)
MCH: 30.1 pg (ref 26.0–34.0)
MCHC: 31.3 g/dL (ref 30.0–36.0)
MCV: 96.4 fL (ref 80.0–100.0)
Monocytes Absolute: 0.9 K/uL (ref 0.1–1.0)
Monocytes Relative: 14 %
Neutro Abs: 2.7 K/uL (ref 1.7–7.7)
Neutrophils Relative %: 40 %
Platelet Count: 238 K/uL (ref 150–400)
RBC: 3.85 MIL/uL — ABNORMAL LOW (ref 3.87–5.11)
RDW: 20.1 % — ABNORMAL HIGH (ref 11.5–15.5)
WBC Count: 6.7 K/uL (ref 4.0–10.5)
nRBC: 0 % (ref 0.0–0.2)

## 2022-06-30 LAB — CMP (CANCER CENTER ONLY)
ALT: 16 U/L (ref 0–44)
AST: 27 U/L (ref 15–41)
Albumin: 3.5 g/dL (ref 3.5–5.0)
Alkaline Phosphatase: 122 U/L (ref 38–126)
Anion gap: 8 (ref 5–15)
BUN: 10 mg/dL (ref 6–20)
CO2: 31 mmol/L (ref 22–32)
Calcium: 9.3 mg/dL (ref 8.9–10.3)
Chloride: 102 mmol/L (ref 98–111)
Creatinine: 0.93 mg/dL (ref 0.44–1.00)
GFR, Estimated: >60 mL/min
Glucose, Bld: 124 mg/dL — ABNORMAL HIGH (ref 70–99)
Potassium: 3.4 mmol/L — ABNORMAL LOW (ref 3.5–5.1)
Sodium: 141 mmol/L (ref 135–145)
Total Bilirubin: 0.5 mg/dL (ref 0.3–1.2)
Total Protein: 6.8 g/dL (ref 6.5–8.1)

## 2022-06-30 LAB — SAMPLE TO BLOOD BANK

## 2022-06-30 LAB — HEPARIN ANTI-XA: Heparin LMW: 0.89 IU/mL

## 2022-06-30 NOTE — Progress Notes (Signed)
Hematology and Oncology Follow Up Visit  Wendy Barber [id number] 03-May-1961 61 y.o. 06/30/2022   Principle Diagnosis:  History of metastatic appendiceal cancer  -- recurrent  Superior mesenteric vein thrombus Tthromboembolism of the RIGHT leg Pernicious anemia  Current Therapy:   HIPEC - Surgery done in [place] in 11/2020 Lovenox 120 mg sq BID --started on 06/20/2022  Vitamin B12 1000 mcg IM monthly     Interim History:  Wendy Barber is in for unscheduled visit.  She called earlier today that she had a big bruise on the back of her left leg.  She had not noticed this earlier.  There is some pain back to her.  She is worried about a blood clot.  She was recently discharged from the hospital.  She was admitted about 10 days or so ago.  She is having a lot of pain and swelling in her legs.  She had a extensive clot in the legs.  A lot of this was because her filter was full.  She was able to have the filter opened up.  An extensive blood clot was removed.  She had been on heparin in the hospital.  We will switch her over to Lovenox.  She is still having her abdominal issues.  She is having problems with diarrhea.  She has had this for quite a while.  She has not noted any obvious bruising.  This morning, she noted a large area of ecchymoses in the back of her left lower leg.  There is little bit of pain there.  She has had no fever.  She has had no change in bowel or bladder habits outside of the diarrhea.  She has had no cough or shortness of breath.  She has had no nausea or vomiting.  She really has not eaten all that much.  Thankfully, she comes in with her husband.  I would have said that her performance status is probably ECOG 1.   Medications:  Current Outpatient Medications:    alum & mag hydroxide-simeth (MAALOX/MYLANTA) 200-200-20 MG/5ML suspension, Take 15 mLs by mouth every 6 (six) hours as needed for indigestion or heartburn., Disp: 355 mL, Rfl: 0   buPROPion  (WELLBUTRIN XL) 150 MG 24 hr tablet, Take 1 tablet (150 mg total) by mouth daily., Disp: 90 tablet, Rfl: 0   busPIRone (BUSPAR) 15 MG tablet, Take 1 tablet (15 mg total) by mouth 3 (three) times daily., Disp: 270 tablet, Rfl: 3   citalopram (CELEXA) 40 MG tablet, Take 1 tablet (40 mg total) by mouth daily., Disp: 90 tablet, Rfl: 0   diazepam (VALIUM) 5 MG tablet, Take 1 tablet 30-40 minutes prior to MRI, Disp: 1 tablet, Rfl: 0   dicyclomine (BENTYL) 20 MG tablet, Take 1 tablet (20 mg total) by mouth in the morning, at noon, in the evening, and at bedtime., Disp: , Rfl:    diphenhydrAMINE (SOMINEX) 25 MG tablet, Take 25 mg by mouth daily as needed for itching., Disp: , Rfl:    diphenoxylate-atropine (LOMOTIL) 2.5-0.025 MG tablet, TAKE 2 TABLETS IN THE MORNING, AT NOON, IN THE EVENING AND AT BEDTIME (Patient taking differently: Take 2 tablets by mouth 4 (four) times daily.), Disp: 240 tablet, Rfl: 3   enoxaparin (LOVENOX) 100 MG/ML injection, Inject 1 mL (100 mg total) into the skin 2 (two) times daily., Disp: 60 mL, Rfl: 6   eszopiclone (LUNESTA) 2 MG TABS tablet, TAKE 1 TABLET(2 MG) BY MOUTH AT BEDTIME AS NEEDED FOR SLEEP (Patient taking differently: Take  2 mg by mouth at bedtime.), Disp: 30 tablet, Rfl: 2   famotidine (PEPCID) 20 MG tablet, TAKE 2 TABLETS TWICE A DAY (Patient taking differently: Take 40 mg by mouth 2 (two) times daily.), Disp: 360 tablet, Rfl: 3   ferrous sulfate 325 (65 FE) MG tablet, Take 1 tablet (325 mg total) by mouth daily., Disp: 30 tablet, Rfl: 3   folic acid (FOLVITE) 1 MG tablet, Take 2 tablets (2 mg total) by mouth daily., Disp: 60 tablet, Rfl: 0   hydrocortisone (ANUSOL-HC) 2.5 % rectal cream, Place rectally 4 (four) times daily as needed for hemorrhoids or anal itching. Use 4 times daily x5 days, then 4 times daily as needed., Disp: 30 g, Rfl: 1   loperamide (IMODIUM) 2 MG capsule, Take 2 capsules (4 mg total) by mouth 4 (four) times daily as needed for diarrhea or loose  stools., Disp: , Rfl:    meclizine (ANTIVERT) 25 MG tablet, Take 1 tablet (25 mg total) by mouth 3 (three) times daily as needed for dizziness. (Patient taking differently: Take 25 mg by mouth 3 (three) times daily.), Disp: 30 tablet, Rfl: 0   ondansetron (ZOFRAN) 8 MG tablet, Take 8 mg by mouth every 8 (eight) hours as needed for nausea., Disp: , Rfl:    orphenadrine (NORFLEX) 100 MG tablet, TAKE 1 TABLET(100 MG) BY MOUTH AT BEDTIME AS NEEDED FOR MUSCLE SPASMS, Disp: 30 tablet, Rfl: 2   oxyCODONE (ROXICODONE) 5 MG immediate release tablet, Take 1 tablet (5 mg total) by mouth every 4 (four) hours as needed for severe pain or breakthrough pain., Disp: 20 tablet, Rfl: 0   pantoprazole (PROTONIX) 40 MG tablet, Take 40 mg by mouth every morning., Disp: , Rfl:    potassium chloride SA (KLOR-CON M) 10 MEQ tablet, Take 2 tablets (20 mEq total) by mouth daily., Disp: 30 tablet, Rfl: 1   promethazine (PHENERGAN) 12.5 MG tablet, Take 1 tablet (12.5 mg total) by mouth 2 (two) times daily as needed. (Patient taking differently: Take 12.5 mg by mouth 2 (two) times daily as needed for nausea.), Disp: 30 tablet, Rfl: 3   propranolol (INDERAL) 20 MG tablet, Take 1 tablet (20 mg total) by mouth 2 (two) times daily., Disp: 180 tablet, Rfl: 1   traMADol (ULTRAM) 50 MG tablet, Take 1 tablet (50 mg total) by mouth every 6 (six) hours as needed for moderate pain., Disp: 20 tablet, Rfl: 0  Allergies:  Allergies  Allergen Reactions   Penicillins Shortness Of Breath    Other reaction(s): Irregular Heart Rate, Other (See Comments) Rapid heartrate    Alprazolam Hives and Other (See Comments)    Hard to arouse, unresponsiveness   Ativan [Lorazepam] Other (See Comments)    Note: tolerates midazolam fine Face & Throat Swelling.   Corticosteroids Other (See Comments)    Other reaction(s): Other (see comments) Psychotic behaviour     Erythromycin     Other reaction(s): Other (See Comments) Severe stomach pain     Prednisone Other (See Comments)    Anxiety & Nervous Breakdown.   [place]  [Milnacipran]    Prednisolone Anxiety    Past Medical History, Surgical history, Social history, and Family History were reviewed and updated.  Review of Systems: Review of Systems  Constitutional:  Positive for fatigue and unexpected weight change.  HENT:  Negative.    Eyes: Negative.   Respiratory: Negative.    Cardiovascular: Negative.   Gastrointestinal:  Positive for abdominal pain and diarrhea.  Endocrine: Negative.  Genitourinary:  Positive for dysuria.   Musculoskeletal:  Positive for back pain.  Skin: Negative.   Neurological: Negative.   Hematological: Negative.   Psychiatric/Behavioral:  Positive for depression.     Physical Exam:  weight is 236 lb (107 kg). Her oral temperature is 98.4 F (36.9 C). Her blood pressure is 159/86 (abnormal) and her pulse is 69. Her respiration is 17 and oxygen saturation is 95%.   Wt Readings from Last 3 Encounters:  06/30/22 236 lb (107 kg)  06/26/22 248 lb 14.4 oz (112.9 kg)  06/04/22 241 lb 10 oz (109.6 kg)    Physical Exam Vitals reviewed.  HENT:     Head: Normocephalic and atraumatic.  Eyes:     Pupils: Pupils are equal, round, and reactive to light.  Cardiovascular:     Rate and Rhythm: Normal rate and regular rhythm.     Heart sounds: Normal heart sounds.  Pulmonary:     Effort: Pulmonary effort is normal.     Breath sounds: Normal breath sounds.  Abdominal:     General: Bowel sounds are normal.     Palpations: Abdomen is soft.     Comments: Abdominal exam shows healed laparotomy scar.  She has no fluid wave.  There is no guarding or rebound tenderness.  She has decent bowel sounds.  There is no palpable liver or spleen tip.  Musculoskeletal:        General: No tenderness or deformity. Normal range of motion.     Cervical back: Normal range of motion.     Comments: On the back of her left lower leg, there is a large ecchymoses.  There is  very little swelling.  There is no firmness.  She has good pulses in her left foot.  There is no tenderness in the left thigh.  She has decent range of motion of her knee and ankle.  Lymphadenopathy:     Cervical: No cervical adenopathy.  Skin:    General: Skin is warm and dry.     Findings: No erythema or rash.  Neurological:     Mental Status: She is alert and oriented to person, place, and time.  Psychiatric:        Behavior: Behavior normal.        Thought Content: Thought content normal.        Judgment: Judgment normal.      Lab Results  Component Value Date   WBC 6.7 06/30/2022   HGB 11.6 (L) 06/30/2022   HCT 37.1 06/30/2022   MCV 96.4 06/30/2022   PLT 238 06/30/2022     Chemistry      Component Value Date/Time   NA 141 06/30/2022 1405   K 3.4 (L) 06/30/2022 1405   CL 102 06/30/2022 1405   CO2 31 06/30/2022 1405   BUN 10 06/30/2022 1405   CREATININE 0.93 06/30/2022 1405      Component Value Date/Time   CALCIUM 9.3 06/30/2022 1405   ALKPHOS 122 06/30/2022 1405   AST 27 06/30/2022 1405   ALT 16 06/30/2022 1405   BILITOT 0.5 06/30/2022 1405      Impression and Plan: Ms. Klier is a very charming 61 year-old white female.  She has an incredible history.  She now has had a recurrence of her appendiceal cancer.  I am just absolutely amazed by this.  It has been about 15 years or so since she had her initial appendiceal cancer removed.  She went back up to [facility] for another  HIPEC procedure.  She had a lot of issues afterwards.  She was hospitalized here with a lot of pain and diarrhea and nonhealing.  She eventually did improve.  Now, our is a problem with the thromboembolic disease.  Again she had this removed.  The Interventional Radiologist really cannot get out the filter because there is some residual thrombus in the femoral vein.  Again, I do not think that there is a blood clot.  Only she has any kind of compartment syndrome.  I think what we need to do  is just have her do the Lovenox daily instead of twice a day.  I think she still needs some kind of anticoagulation because of her extensive thromboembolic disease.  We already do see her back for regular appointment on Monday.  We will keep that appointment and see how her left leg is doing.   Volanda Napoleon, MD 9/7/20234:34 PM

## 2022-06-30 NOTE — Telephone Encounter (Signed)
Call placed to patient to inform her that Dr. [name] has reviewed her MyChart message and picture and would like for her to come in for lab work and to see him.  Pt states that she can be here by 2:00PM today and is appreciative of call.

## 2022-07-01 ENCOUNTER — Other Ambulatory Visit: Payer: Self-pay | Admitting: *Deleted

## 2022-07-01 DIAGNOSIS — I82412 Acute embolism and thrombosis of left femoral vein: Secondary | ICD-10-CM

## 2022-07-04 ENCOUNTER — Inpatient Hospital Stay: Payer: Medicare Other

## 2022-07-04 ENCOUNTER — Encounter: Payer: Self-pay | Admitting: Hematology & Oncology

## 2022-07-04 ENCOUNTER — Inpatient Hospital Stay (HOSPITAL_BASED_OUTPATIENT_CLINIC_OR_DEPARTMENT_OTHER): Payer: Medicare Other | Admitting: Hematology & Oncology

## 2022-07-04 VITALS — BP 142/89 | HR 68 | Temp 98.5°F | Resp 20 | Ht 66.0 in | Wt 227.0 lb

## 2022-07-04 DIAGNOSIS — I82403 Acute embolism and thrombosis of unspecified deep veins of lower extremity, bilateral: Secondary | ICD-10-CM | POA: Diagnosis not present

## 2022-07-04 DIAGNOSIS — R197 Diarrhea, unspecified: Secondary | ICD-10-CM

## 2022-07-04 DIAGNOSIS — C181 Malignant neoplasm of appendix: Secondary | ICD-10-CM | POA: Diagnosis not present

## 2022-07-04 DIAGNOSIS — I82412 Acute embolism and thrombosis of left femoral vein: Secondary | ICD-10-CM

## 2022-07-04 LAB — CMP (CANCER CENTER ONLY)
ALT: 18 U/L (ref 0–44)
AST: 28 U/L (ref 15–41)
Albumin: 3.7 g/dL (ref 3.5–5.0)
Alkaline Phosphatase: 153 U/L — ABNORMAL HIGH (ref 38–126)
Anion gap: 9 (ref 5–15)
BUN: 9 mg/dL (ref 6–20)
CO2: 29 mmol/L (ref 22–32)
Calcium: 9.3 mg/dL (ref 8.9–10.3)
Chloride: 103 mmol/L (ref 98–111)
Creatinine: 0.92 mg/dL (ref 0.44–1.00)
GFR, Estimated: >60 mL/min (ref >60)
Glucose, Bld: 175 mg/dL — ABNORMAL HIGH (ref 70–99)
Potassium: 3 mmol/L — ABNORMAL LOW (ref 3.5–5.1)
Sodium: 141 mmol/L (ref 135–145)
Total Bilirubin: 0.7 mg/dL (ref 0.3–1.2)
Total Protein: 6.7 g/dL (ref 6.5–8.1)

## 2022-07-04 LAB — CBC WITH DIFFERENTIAL (CANCER CENTER ONLY)
Abs Immature Granulocytes: 0.02 10*3/uL (ref 0.00–0.07)
Basophils Absolute: 0.1 10*3/uL (ref 0.0–0.1)
Basophils Relative: 1 %
Eosinophils Absolute: 0.3 10*3/uL (ref 0.0–0.5)
Eosinophils Relative: 4 %
HCT: 39.1 % (ref 36.0–46.0)
Hemoglobin: 12.5 g/dL (ref 12.0–15.0)
Immature Granulocytes: 0 %
Lymphocytes Relative: 31 %
Lymphs Abs: 2.4 10*3/uL (ref 0.7–4.0)
MCH: 30.3 pg (ref 26.0–34.0)
MCHC: 32 g/dL (ref 30.0–36.0)
MCV: 94.7 fL (ref 80.0–100.0)
Monocytes Absolute: 0.7 10*3/uL (ref 0.1–1.0)
Monocytes Relative: 9 %
Neutro Abs: 4.2 10*3/uL (ref 1.7–7.7)
Neutrophils Relative %: 55 %
Platelet Count: 272 10*3/uL (ref 150–400)
RBC: 4.13 MIL/uL (ref 3.87–5.11)
RDW: 19.7 % — ABNORMAL HIGH (ref 11.5–15.5)
WBC Count: 7.6 10*3/uL (ref 4.0–10.5)
nRBC: 0 % (ref 0.0–0.2)

## 2022-07-04 LAB — HEPARIN ANTI-XA: Heparin LMW: 0.73 IU/mL

## 2022-07-04 MED ORDER — HYDROCORTISONE ACE-PRAMOXINE 1-1 % EX CREA: 1.0000 | TOPICAL_CREAM | Freq: Two times a day (BID) | CUTANEOUS | 3 refills | Status: DC

## 2022-07-04 NOTE — Progress Notes (Signed)
Hematology and Oncology Follow Up Visit  Wendy Barber [id number] 1961-04-12 61 y.o. 07/04/2022   Principle Diagnosis:  History of metastatic appendiceal cancer  -- recurrent  Superior mesenteric vein thrombus Tthromboembolism of the RIGHT leg Pernicious anemia  Current Therapy:   HIPEC - Surgery done in [place] in 11/2020 Lovenox 120 mg sq BID --started on 06/20/2022  Vitamin B12 1000 mcg IM monthly     Interim History:  Wendy Barber is in for follow-up.  When she saw her last week when she came in for an unscheduled visit because of a large bruise on the back of her left lower leg.  This seems to be getting better.  There are still little bit of tenderness.  There really is no swelling.  We had her go to Lovenox daily.  I told her to do Lovenox daily for another 4 days and then go to twice a day.    The real problem she has is a horrible diarrhea.  She had 15-20 episodes yesterday.  This is been a huge problem for her.  She really has no quality of life because of the diarrhea.  She really cannot go anywhere unless she is close to a bathroom.  She really has a hard time eating anything significant without having to go to the bathroom.  I think 1 option for her at this point given that she has no quality of life is the fact that she may need to have a colostomy.  At least, with a colostomy bag, she will have a little more freedom.  I know this is incredibly aggressive.  However, nothing has worked has helped the diarrhea.  She is seen gastroenterology.  She saw her surgeon up in [facility].  Again nothing has helped her.  I am going to send her stool for C. difficile just on the outside chance that she has C. difficile.  I would not think that this would be an issue.  She does have some abdominal discomfort.  There is no obvious bleeding.  There is no obvious nausea or vomiting.  Currently, I would have to say that her performance status is probably ECOG 1.    Medications:   Current Outpatient Medications:    alum & mag hydroxide-simeth (MAALOX/MYLANTA) 200-200-20 MG/5ML suspension, Take 15 mLs by mouth every 6 (six) hours as needed for indigestion or heartburn., Disp: 355 mL, Rfl: 0   buPROPion (WELLBUTRIN XL) 150 MG 24 hr tablet, Take 1 tablet (150 mg total) by mouth daily., Disp: 90 tablet, Rfl: 0   busPIRone (BUSPAR) 15 MG tablet, Take 1 tablet (15 mg total) by mouth 3 (three) times daily., Disp: 270 tablet, Rfl: 3   citalopram (CELEXA) 40 MG tablet, Take 1 tablet (40 mg total) by mouth daily., Disp: 90 tablet, Rfl: 0   diazepam (VALIUM) 5 MG tablet, Take 1 tablet 30-40 minutes prior to MRI, Disp: 1 tablet, Rfl: 0   dicyclomine (BENTYL) 20 MG tablet, Take 1 tablet (20 mg total) by mouth in the morning, at noon, in the evening, and at bedtime., Disp: , Rfl:    diphenoxylate-atropine (LOMOTIL) 2.5-0.025 MG tablet, TAKE 2 TABLETS IN THE MORNING, AT NOON, IN THE EVENING AND AT BEDTIME (Patient taking differently: Take 2 tablets by mouth 4 (four) times daily.), Disp: 240 tablet, Rfl: 3   enoxaparin (LOVENOX) 100 MG/ML injection, Inject 1 mL (100 mg total) into the skin 2 (two) times daily., Disp: 60 mL, Rfl: 6   eszopiclone (LUNESTA) 2 MG  TABS tablet, TAKE 1 TABLET(2 MG) BY MOUTH AT BEDTIME AS NEEDED FOR SLEEP (Patient taking differently: Take 2 mg by mouth at bedtime.), Disp: 30 tablet, Rfl: 2   famotidine (PEPCID) 20 MG tablet, TAKE 2 TABLETS TWICE A DAY (Patient taking differently: Take 40 mg by mouth 2 (two) times daily.), Disp: 360 tablet, Rfl: 3   ferrous sulfate 325 (65 FE) MG tablet, Take 1 tablet (325 mg total) by mouth daily., Disp: 30 tablet, Rfl: 3   folic acid (FOLVITE) 1 MG tablet, Take 2 tablets (2 mg total) by mouth daily., Disp: 60 tablet, Rfl: 0   hydrocortisone (ANUSOL-HC) 2.5 % rectal cream, Place rectally 4 (four) times daily as needed for hemorrhoids or anal itching. Use 4 times daily x5 days, then 4 times daily as needed., Disp: 30 g, Rfl: 1    loperamide (IMODIUM) 2 MG capsule, Take 2 capsules (4 mg total) by mouth 4 (four) times daily as needed for diarrhea or loose stools., Disp: , Rfl:    meclizine (ANTIVERT) 25 MG tablet, Take 1 tablet (25 mg total) by mouth 3 (three) times daily as needed for dizziness. (Patient taking differently: Take 25 mg by mouth 3 (three) times daily.), Disp: 30 tablet, Rfl: 0   ondansetron (ZOFRAN) 8 MG tablet, Take 8 mg by mouth every 8 (eight) hours as needed for nausea., Disp: , Rfl:    orphenadrine (NORFLEX) 100 MG tablet, TAKE 1 TABLET(100 MG) BY MOUTH AT BEDTIME AS NEEDED FOR MUSCLE SPASMS, Disp: 30 tablet, Rfl: 2   oxyCODONE (ROXICODONE) 5 MG immediate release tablet, Take 1 tablet (5 mg total) by mouth every 4 (four) hours as needed for severe pain or breakthrough pain., Disp: 20 tablet, Rfl: 0   pantoprazole (PROTONIX) 40 MG tablet, Take 40 mg by mouth every morning., Disp: , Rfl:    potassium chloride SA (KLOR-CON M) 10 MEQ tablet, Take 2 tablets (20 mEq total) by mouth daily., Disp: 30 tablet, Rfl: 1   promethazine (PHENERGAN) 12.5 MG tablet, Take 1 tablet (12.5 mg total) by mouth 2 (two) times daily as needed. (Patient taking differently: Take 12.5 mg by mouth 2 (two) times daily as needed for nausea.), Disp: 30 tablet, Rfl: 3   propranolol (INDERAL) 20 MG tablet, Take 1 tablet (20 mg total) by mouth 2 (two) times daily., Disp: 180 tablet, Rfl: 1   traMADol (ULTRAM) 50 MG tablet, Take 1 tablet (50 mg total) by mouth every 6 (six) hours as needed for moderate pain., Disp: 20 tablet, Rfl: 0  Allergies:  Allergies  Allergen Reactions   Penicillins Shortness Of Breath    Other reaction(s): Irregular Heart Rate, Other (See Comments) Rapid heartrate    Alprazolam Hives and Other (See Comments)    Hard to arouse, unresponsiveness   Ativan [Lorazepam] Other (See Comments)    Note: tolerates midazolam fine Face & Throat Swelling.   Corticosteroids Other (See Comments)    Other reaction(s): Other (see  comments) Psychotic behaviour     Erythromycin     Other reaction(s): Other (See Comments) Severe stomach pain    Prednisone Other (See Comments)    Anxiety & Nervous Breakdown.   [place]  [Milnacipran]    Prednisolone Anxiety    Past Medical History, Surgical history, Social history, and Family History were reviewed and updated.  Review of Systems: Review of Systems  Constitutional:  Positive for fatigue and unexpected weight change.  HENT:  Negative.    Eyes: Negative.   Respiratory: Negative.  Cardiovascular: Negative.   Gastrointestinal:  Positive for abdominal pain and diarrhea.  Endocrine: Negative.   Genitourinary:  Positive for dysuria.   Musculoskeletal:  Positive for back pain.  Skin: Negative.   Neurological: Negative.   Hematological: Negative.   Psychiatric/Behavioral:  Positive for depression.     Physical Exam:  vitals were not taken for this visit.   Wt Readings from Last 3 Encounters:  06/30/22 236 lb (107 kg)  06/26/22 248 lb 14.4 oz (112.9 kg)  06/04/22 241 lb 10 oz (109.6 kg)    Physical Exam Vitals reviewed.  HENT:     Head: Normocephalic and atraumatic.  Eyes:     Pupils: Pupils are equal, round, and reactive to light.  Cardiovascular:     Rate and Rhythm: Normal rate and regular rhythm.     Heart sounds: Normal heart sounds.  Pulmonary:     Effort: Pulmonary effort is normal.     Breath sounds: Normal breath sounds.  Abdominal:     General: Bowel sounds are normal.     Palpations: Abdomen is soft.     Comments: Abdominal exam shows healed laparotomy scar.  She has no fluid wave.  There is no guarding or rebound tenderness.  She has decent bowel sounds.  There is no palpable liver or spleen tip.  Musculoskeletal:        General: No tenderness or deformity. Normal range of motion.     Cervical back: Normal range of motion.     Comments: On the back of her left lower leg, the ecchymoses is improving.  It is not nearly as extensive.   It is not as dark.  There is still a little bit of tenderness in a localized area.  No venous cord is noted.    Lymphadenopathy:     Cervical: No cervical adenopathy.  Skin:    General: Skin is warm and dry.     Findings: No erythema or rash.  Neurological:     Mental Status: She is alert and oriented to person, place, and time.  Psychiatric:        Behavior: Behavior normal.        Thought Content: Thought content normal.        Judgment: Judgment normal.      Lab Results  Component Value Date   WBC 7.6 07/04/2022   HGB 12.5 07/04/2022   HCT 39.1 07/04/2022   MCV 94.7 07/04/2022   PLT 272 07/04/2022     Chemistry      Component Value Date/Time   NA 141 06/30/2022 1405   K 3.4 (L) 06/30/2022 1405   CL 102 06/30/2022 1405   CO2 31 06/30/2022 1405   BUN 10 06/30/2022 1405   CREATININE 0.93 06/30/2022 1405      Component Value Date/Time   CALCIUM 9.3 06/30/2022 1405   ALKPHOS 122 06/30/2022 1405   AST 27 06/30/2022 1405   ALT 16 06/30/2022 1405   BILITOT 0.5 06/30/2022 1405      Impression and Plan: Wendy Barber is a very charming 61 year-old white female.  She has an incredible history.  She now has had a recurrence of her appendiceal cancer.  I am just absolutely amazed by this.  It has been about 15 years or so since she had her initial appendiceal cancer removed.  She went back up to [facility] for another HIPEC procedure.  She had a lot of issues afterwards.  She was hospitalized here with a lot of  pain and diarrhea and nonhealing.  She eventually did improve.  Again, I think that this bleeding was just subcutaneous.  There is no compromise.  There is no compartment syndrome.  At this point, because of the issues with diarrhea, I think we will going to have to think about getting her to surgery to see if they would consider a colostomy for her.  I am not sure how else we can fix the diarrhea so that she will have some quality of life.  I think if she did have a  colostomy, she could at least change this frequently and not had to be literally tied to a bathroom.  I want to make sure that she does not have C. difficile.  If her stool culture is negative for C. difficile, then we will see about getting her to surgery.  Again, we will have her do daily Lovenox until Friday and then go back to twice a day Lovenox.  As far as follow-up with [place], this will really depend on the C. difficile culture and her being seen by Colorectal Surgery.   Volanda Napoleon, MD 9/11/20233:00 PM

## 2022-07-05 ENCOUNTER — Inpatient Hospital Stay: Payer: Medicare Other

## 2022-07-05 DIAGNOSIS — C181 Malignant neoplasm of appendix: Secondary | ICD-10-CM | POA: Diagnosis not present

## 2022-07-05 DIAGNOSIS — R197 Diarrhea, unspecified: Secondary | ICD-10-CM

## 2022-07-05 LAB — C DIFFICILE QUICK SCREEN W PCR REFLEX
C Diff antigen: POSITIVE — AB
C Diff toxin: NEGATIVE

## 2022-07-06 ENCOUNTER — Other Ambulatory Visit: Payer: Self-pay | Admitting: Hematology & Oncology

## 2022-07-06 ENCOUNTER — Telehealth: Payer: Self-pay | Admitting: *Deleted

## 2022-07-06 LAB — CLOSTRIDIUM DIFFICILE BY PCR, REFLEXED: Toxigenic C. Difficile by PCR: POSITIVE — AB

## 2022-07-06 MED ORDER — FIDAXOMICIN 200 MG PO TABS: 200.0000 mg | ORAL_TABLET | Freq: Two times a day (BID) | ORAL | 0 refills | Status: DC

## 2022-07-06 NOTE — Telephone Encounter (Signed)
-----   Message from Volanda Napoleon, MD sent at 07/06/2022  6:30 AM EDT ----- Call and let her know that the stool is positive for C. difficile.  I will have to call in some antibiotic for her.

## 2022-07-06 NOTE — Progress Notes (Unsigned)
Fidaxom

## 2022-07-06 NOTE — Telephone Encounter (Signed)
As noted below by Dr. [name], I informed the patient that she is positive for C.Diff. Dr. [name] called in an antibiotic for 10 days. Please start today. She verbalized understanding.

## 2022-07-07 ENCOUNTER — Ambulatory Visit (INDEPENDENT_AMBULATORY_CARE_PROVIDER_SITE_OTHER): Payer: Medicare Other | Admitting: Family Medicine

## 2022-07-07 ENCOUNTER — Encounter: Payer: Self-pay | Admitting: Family Medicine

## 2022-07-07 ENCOUNTER — Other Ambulatory Visit: Payer: Self-pay | Admitting: Family Medicine

## 2022-07-07 VITALS — BP 138/88 | HR 67 | Temp 98.4°F | Ht 66.0 in | Wt 226.4 lb

## 2022-07-07 DIAGNOSIS — E876 Hypokalemia: Secondary | ICD-10-CM

## 2022-07-07 DIAGNOSIS — I10 Essential (primary) hypertension: Secondary | ICD-10-CM

## 2022-07-07 DIAGNOSIS — Z794 Long term (current) use of insulin: Secondary | ICD-10-CM

## 2022-07-07 DIAGNOSIS — F418 Other specified anxiety disorders: Secondary | ICD-10-CM

## 2022-07-07 DIAGNOSIS — D649 Anemia, unspecified: Secondary | ICD-10-CM

## 2022-07-07 DIAGNOSIS — I82403 Acute embolism and thrombosis of unspecified deep veins of lower extremity, bilateral: Secondary | ICD-10-CM

## 2022-07-07 DIAGNOSIS — N179 Acute kidney failure, unspecified: Secondary | ICD-10-CM

## 2022-07-07 DIAGNOSIS — A0472 Enterocolitis due to Clostridium difficile, not specified as recurrent: Secondary | ICD-10-CM

## 2022-07-07 MED ORDER — POTASSIUM CHLORIDE CRYS ER 10 MEQ PO TBCR: 20.0000 meq | EXTENDED_RELEASE_TABLET | Freq: Every day | ORAL | 1 refills | Status: DC

## 2022-07-07 NOTE — Progress Notes (Unsigned)
Subjective:  Patient ID: Wendy Barber, female    DOB: 1961-06-07  Age: 61 y.o. MRN: [id number]  CC:  Chief Complaint  Patient presents with   Hospitalization Follow-up    Pt states oncologist called and said she had Cdiff, pt has had chronic diarrhea for 18 months    Dermatology    Pt wants a possible referral    Depression    PHQ9 - 26    HPI Wendy Barber presents for   Transition of care visit.  Transition of care phone call noted on 06/28/2022. Admitted August 21 through September 3.  Bilateral lower extremity DVT: Underwent mechanical thrombolysis with left IJ portacatheter placement on August 28.  Heparin for anticoagulation transition to enoxaparin.  Long-acting oxycodone 10 mg twice daily for persistent lower extremity pain and abdominal pain.  Oxycodone 5 mg and IV hydromorphone as needed with scheduled acetaminophen.  Side effects with gabapentin in the past.  Avoiding NSAIDs given to anticoagulation.  Initial plan to remove IVC filter on 06/22/2022, but procedure canceled as residual clot noted in the femoral vein.  Plan for outpatient interventional radiology follow-up. Seen by oncology on September 7.  Transition to daily Lovenox with extensive thromboembolic disease - bruising in leg at that time with restarting BID dosing tomorrow. No new CP or dyspnea.  Taking oxycodone '10mg'$  at night for persistent leg pains.   Urinary tract infection Treated during hospitalization with 5-day course of IV Rocephin.  Urine culture with multiple species.  Deferred further antibiotics. No dysuria, frequency, urgency.   AKI with hypokalemia. Improved during hospitalization, creatinine down to 1.05, potassium 3.7 with bicarb of 30 at discharge, outpatient follow-up planned.  Also noted to have hypomagnesemia (1.3 on 06/22/22), 2 g IV on day of discharge, plan for outpatient repeat testing. Drinking fluids, water. 1 meal per day.  CMP few days ago noted. Potassium lower at 3.0 from 3.4 on  06/30/22.  Still feels dizzy at times. No palpitations.  Due for repeat Mag test.   Iron deficiency anemia with chronic blood loss. Hemoglobin stabilized at 10.6 by day of discharge, IV iron was given.  And 5 units packed red blood cells during hospitalization.still dizzy as above. no dark stools. repeat HGB  Lab Results  Component Value Date   WBC 7.6 07/04/2022   HGB 12.5 07/04/2022   HCT 39.1 07/04/2022   MCV 94.7 07/04/2022   PLT 272 07/04/2022    Gastrointestinal Gastritis treated with antacid therapy, pantoprazole, famotidine, sucralfate.  As needed antiemetics.  Tolerating soft diet at hospital discharge. Has had chronic diarrhea.  Oncology note reviewed from September 7.  History of recurrence of appendiceal cancer, status post HIPEC procedure. Repeat visit with oncology September 11.  Chronic diarrhea discussed.  Option of surgical referral for possible colostomy if unable to achieve relief with other approaches.  Did have C. difficile testing that was positive, result note from yesterday, Dr. [name] plan to send in medication for patient. Started on fidaxomicin yesterday.    Depression:  Meeting with therapist at behavioral health. Treated with celexa, wellbutrin, lunesta.   SI: no intent or plan. Fleeting thoughts of if not here. Has discussed with mental health specialist.       07/07/2022    1:17 PM 05/12/2022   12:16 PM 04/21/2022    3:57 PM 11/29/2021    2:39 PM 08/25/2021    1:35 PM  Depression screen PHQ 2/9  Decreased Interest '3 3 3 3   '$ Down, Depressed, Hopeless 3  $'3 3 3   'l$ PHQ - 2 Score '6 6 6 6   '$ Altered sleeping '3 3 3 2   '$ Tired, decreased energy '3 3 3 3   '$ Change in appetite '3 3 3 3   '$ Feeling bad or failure about yourself  '3 3 3 2   '$ Trouble concentrating '3 3 3 2   '$ Moving slowly or fidgety/restless '3 2 2 '$ 0   Suicidal thoughts '2 2 2 1   '$ PHQ-9 Score '26 25 25 19   '$ Difficult doing work/chores   Very difficult Not difficult at all      Information is confidential  and restricted. Go to Review Flowsheets to unlock data.     History Patient Active Problem List   Diagnosis Date Noted   Hemorrhoids 06/25/2022   Occasional tremors: Essential tremors 06/23/2022   Hypomagnesemia 06/22/2022   Iron deficiency anemia    Gastritis 06/18/2022   Depression 06/18/2022   UTI (urinary tract infection) 06/16/2022   DVT, bilateral lower limbs ([place]) 06/14/2022   Symptomatic bradycardia 06/13/2022   History of excision of intestinal structure 06/13/2022   CAD (coronary artery disease) 06/13/2022   Acute lower GI bleeding 05/04/2022   AKI (acute kidney injury) ([place]) 05/04/2022   History of deep vein thrombosis (DVT) of lower extremity 05/04/2022   History of pulmonary embolus (PE) 05/04/2022   Anxiety 05/04/2022   Pernicious anemia 05/14/2021   Iron deficiency anemia due to chronic blood loss 05/14/2021   Abdominal wall abscess    Surgical wound infection 01/16/2021   C. 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   DVT of deep femoral vein, left ([place]) 03/19/2020   Pulmonary embolism, bilateral ([place]) 03/19/2020   Presence of IVC filter 03/19/2020   Hepatic encephalopathy ([place]) 11/22/2019   Confusion 11/21/2019   Autoimmune hepatitis ([place]) 11/21/2019   HTN (hypertension) 11/21/2019   DM2 (diabetes mellitus, type 2) ([place]) 11/21/2019   AMS (altered mental status) 11/21/2019   Closed right ankle fracture 08/27/2019   Acute deep vein thrombosis (DVT) of femoral vein of left lower extremity ([place]) 08/03/2018   History of colon cancer 08/03/2018   History of partial colectomy 08/03/2018   Spinal stenosis 08/03/2018   Morbid obesity ([place]) 08/03/2018   Cerebral meningioma ([place]) 01/05/2018   Arthritis of right shoulder region  02/18/2017   History of renal calculi 02/14/2016   Midline low back pain 02/14/2016   Class 3 obesity ([place]) 01/07/2016   Migraine with aura and without status migrainosus, not intractable 01/07/2016   Chronic fatigue syndrome 09/23/2013   GERD (gastroesophageal reflux disease) 09/23/2013   History of hepatitis A 09/23/2013   Rheumatoid arthritis involving multiple sites ([place]) 09/23/2013   Past Medical History:  Diagnosis Date   Arthritis    Back pain    Cancer ([place])    pseudomyxoma peritonei   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   Pernicious anemia 05/14/2021   Presence of IVC filter 03/19/2020  Pulmonary embolism, bilateral ([place]) 03/19/2020   Past Surgical History:  Procedure Laterality Date   ABDOMINAL HYSTERECTOMY     ABDOMINAL SURGERY     ACHILLES TENDON REPAIR     APPENDECTOMY     ARTHROPLASTY     CARPAL TUNNEL RELEASE     CHOLECYSTECTOMY     IR CATHETER TUBE CHANGE  02/02/2021   IR CATHETER TUBE CHANGE  02/25/2021   IR IMAGING GUIDED PORT INSERTION  06/20/2022   IR RADIOLOGIST EVAL & MGMT  02/24/2021   IR RADIOLOGIST EVAL & MGMT  03/10/2021   IR THROMBECT VENO MECH MOD SED  06/20/2022   IR US GUIDE BX ASP/DRAIN  11/25/2019   IR US GUIDE VASC ACCESS LEFT  06/20/2022   IR US GUIDE VASC ACCESS RIGHT  06/20/2022   IR US GUIDE VASC ACCESS RIGHT  06/20/2022   IR VENO/EXT/BI  06/20/2022   IR VENOCAVAGRAM IVC  06/20/2022   JOINT REPLACEMENT     KNEE ARTHROSCOPY     ORIF ANKLE FRACTURE Right 08/27/2019   Procedure: OPEN REDUCTION INTERNAL FIXATION RIGHT ANKLE FRACTURE;  Surgeon: Renette Butters, MD;  Location: [facility];   Service: Orthopedics;  Laterality: Right;   perineorrophy     TONGUE BIOPSY     Allergies  Allergen Reactions   Penicillins Shortness Of Breath    Other reaction(s): Irregular Heart Rate, Other (See Comments) Rapid heartrate    Alprazolam Hives and Other (See Comments)    Hard to arouse, unresponsiveness   Ativan [Lorazepam] Other (See Comments)    Note: tolerates midazolam fine Face & Throat Swelling.   Corticosteroids Other (See Comments)    Other reaction(s): Other (see comments) Psychotic behaviour     Erythromycin     Other reaction(s): Other (See Comments) Severe stomach pain    Prednisone Other (See Comments)    Anxiety & Nervous Breakdown.   Savella  [Milnacipran]    Prednisolone Anxiety   Prior to Admission medications   Medication Sig Start Date End Date Taking? Authorizing Provider  alum & mag hydroxide-simeth (MAALOX/MYLANTA) 200-200-20 MG/5ML suspension Take 15 mLs by mouth every 6 (six) hours as needed for indigestion or heartburn. 05/08/22  Yes Aline August, MD  buPROPion (WELLBUTRIN XL) 150 MG 24 hr tablet Take 1 tablet (150 mg total) by mouth daily. 04/27/22  Yes White, Aaron Edelman A, NP  busPIRone (BUSPAR) 15 MG tablet Take 1 tablet (15 mg total) by mouth 3 (three) times daily. 04/05/22  Yes Lesle Chris A, NP  citalopram (CELEXA) 40 MG tablet Take 1 tablet (40 mg total) by mouth daily. 04/27/22  Yes White, Louanna Raw, NP  dicyclomine (BENTYL) 20 MG tablet Take 1 tablet (20 mg total) by mouth in the morning, at noon, in the evening, and at bedtime. 05/08/22  Yes Aline August, MD  diphenoxylate-atropine (LOMOTIL) 2.5-0.025 MG tablet TAKE 2 TABLETS IN THE MORNING, AT NOON, IN THE EVENING AND AT BEDTIME Patient taking differently: Take 2 tablets by mouth 4 (four) times daily. 04/22/22  Yes Volanda Napoleon, MD  enoxaparin (LOVENOX) 100 MG/ML injection Inject 1 mL (100 mg total) into the skin 2 (two) times daily. Patient taking differently: Inject 100 mg into the skin daily.  Has been taking daily since 06/30/22. 06/24/22  Yes Eugenie Filler, MD  eszopiclone (LUNESTA) 2 MG TABS tablet TAKE 1 TABLET(2 MG) BY MOUTH AT BEDTIME AS NEEDED FOR SLEEP Patient taking differently: Take 2 mg by mouth at bedtime. 06/07/22  Yes Elwanda Brooklyn,  NP  famotidine (PEPCID) 20 MG tablet TAKE 2 TABLETS TWICE A DAY Patient taking differently: Take 40 mg by mouth 2 (two) times daily. 03/01/22  Yes Volanda Napoleon, MD  fidaxomicin (DIFICID) 200 MG TABS tablet Take 1 tablet (200 mg total) by mouth 2 (two) times daily. 07/06/22  Yes Ennever, Rudell Cobb, MD  hydrocortisone (ANUSOL-HC) 2.5 % rectal cream Place rectally 4 (four) times daily as needed for hemorrhoids or anal itching. Use 4 times daily x5 days, then 4 times daily as needed. 06/26/22  Yes Eugenie Filler, MD  loperamide (IMODIUM) 2 MG capsule Take 2 capsules (4 mg total) by mouth 4 (four) times daily as needed for diarrhea or loose stools. 05/08/22  Yes Aline August, MD  meclizine (ANTIVERT) 25 MG tablet Take 1 tablet (25 mg total) by mouth 3 (three) times daily as needed for dizziness. Patient taking differently: Take 25 mg by mouth 3 (three) times daily. 05/30/22  Yes [name], MD  ondansetron (ZOFRAN) 8 MG tablet Take 8 mg by mouth every 8 (eight) hours as needed for nausea. 03/03/21  Yes [provider]  orphenadrine (NORFLEX) 100 MG tablet TAKE 1 TABLET(100 MG) BY MOUTH AT BEDTIME AS NEEDED FOR MUSCLE SPASMS 06/06/22  Yes Ennever, Rudell Cobb, MD  oxyCODONE (ROXICODONE) 5 MG immediate release tablet Take 1 tablet (5 mg total) by mouth every 4 (four) hours as needed for severe pain or breakthrough pain. 06/04/22  Yes Shah, Pratik D, DO  pantoprazole (PROTONIX) 40 MG tablet Take 40 mg by mouth every morning. 01/01/21  Yes [provider]  pramoxine-hydrocortisone (PROCTOCREAM-HC) 1-1 % rectal cream Place 1 Application rectally 2 (two) times daily. 07/04/22  Yes Ennever, Rudell Cobb, MD  promethazine (PHENERGAN) 12.5 MG tablet  Take 1 tablet (12.5 mg total) by mouth 2 (two) times daily as needed. Patient taking differently: Take 12.5 mg by mouth 2 (two) times daily as needed for nausea. 04/21/22  Yes [name], MD  propranolol (INDERAL) 20 MG tablet Take 1 tablet (20 mg total) by mouth 2 (two) times daily. 04/21/22  Yes [name], MD  diazepam (VALIUM) 5 MG tablet Take 1 tablet 30-40 minutes prior to MRI Patient not taking: Reported on 07/04/2022 06/13/22   Pieter Partridge, DO  ferrous sulfate 325 (65 FE) MG tablet Take 1 tablet (325 mg total) by mouth daily. Patient not taking: Reported on 07/07/2022 06/03/22 06/03/23  Heath Lark D, DO  folic acid (FOLVITE) 1 MG tablet Take 2 tablets (2 mg total) by mouth daily. Patient not taking: Reported on 07/07/2022 05/09/22   Aline August, MD  potassium chloride SA (KLOR-CON M) 10 MEQ tablet Take 2 tablets (20 mEq total) by mouth daily. Patient not taking: Reported on 07/04/2022 05/12/22   [name], MD  traMADol (ULTRAM) 50 MG tablet Take 1 tablet (50 mg total) by mouth every 6 (six) hours as needed for moderate pain. Patient not taking: Reported on 07/04/2022 05/08/22   Aline August, MD   Social History   Socioeconomic History   Marital status: Married    Spouse name: Not on file   Number of children: 3   Years of education: Not on file   Highest education level: Some college, no degree  Occupational History   Occupation: disability  Tobacco Use   Smoking status: Never   Smokeless tobacco: Never  Vaping Use   Vaping Use: Never used  Substance and Sexual Activity   Alcohol use: No  Drug use: Never   Sexual activity: Yes    Birth control/protection: None    Comment: hysterectomy  Other Topics Concern   Not on file  Social History Narrative   Right handed   One story home   Drinks occasional caffeine   Social Determinants of Health   Financial Resource Strain: Not on file  Food Insecurity: Not on file  Transportation Needs: Not on file   Physical Activity: Not on file  Stress: Not on file  Social Connections: Not on file  Intimate Partner Violence: Not on file    Review of Systems Per HPI.   Objective:   Vitals:   07/07/22 1322  BP: 138/88  Pulse: 67  Temp: 98.4 F (36.9 C)  SpO2: 97%  Weight: 226 lb 6.4 oz (102.7 kg)  Height: '5\' 6"'$  (1.676 m)     Physical Exam Vitals reviewed.  Constitutional:      Appearance: Normal appearance. She is well-developed.  HENT:     Head: Normocephalic and atraumatic.  Eyes:     Conjunctiva/sclera: Conjunctivae normal.     Pupils: Pupils are equal, round, and reactive to light.  Neck:     Vascular: No carotid bruit.  Cardiovascular:     Rate and Rhythm: Normal rate and regular rhythm.     Heart sounds: Normal heart sounds.  Pulmonary:     Effort: Pulmonary effort is normal.     Breath sounds: Normal breath sounds.  Abdominal:     Palpations: Abdomen is soft. There is no pulsatile mass.     Tenderness: There is abdominal tenderness (Diffuse, lower abdomen without rebound/guarding.).  Musculoskeletal:     Comments:  Lower extremities diffuse tenderness bilaterally,mild.   Skin:    General: Skin is warm and dry.  Neurological:     Mental Status: She is alert and oriented to person, place, and time.  Psychiatric:        Mood and Affect: Mood normal.        Behavior: Behavior normal.        Assessment & Plan:  Wendy Barber is a 61 y.o. female . Hypertension, unspecified type  Type 2 diabetes mellitus with hyperglycemia, with long-term current use of insulin (HCC)   No orders of the defined types were placed in this encounter.  There are no Patient Instructions on file for this visit.    Signed,   Merri Ray, MD [place] 07/07/22 1:52 PM

## 2022-07-07 NOTE — Patient Instructions (Addendum)
I will check potassium and magnesium today. Start potassium 4 pills today, then 2 pills per day. I may change dose once I see results today. If any heart palpitations or new symptoms be seen in ER.    I am hopeful that the C diff treatment will give you some relief soon.   Hang in there!  Return to the clinic or go to the nearest emergency room if any of your symptoms worsen or new symptoms occur.

## 2022-07-08 ENCOUNTER — Encounter (HOSPITAL_COMMUNITY): Payer: Self-pay

## 2022-07-08 ENCOUNTER — Other Ambulatory Visit: Payer: Self-pay | Admitting: Behavioral Health

## 2022-07-08 DIAGNOSIS — F331 Major depressive disorder, recurrent, moderate: Secondary | ICD-10-CM

## 2022-07-08 DIAGNOSIS — F411 Generalized anxiety disorder: Secondary | ICD-10-CM

## 2022-07-08 LAB — BASIC METABOLIC PANEL
BUN: 14 mg/dL (ref 6–23)
CO2: 29 mEq/L (ref 19–32)
Calcium: 9.3 mg/dL (ref 8.4–10.5)
Chloride: 103 mEq/L (ref 96–112)
Creatinine, Ser: 1.09 mg/dL (ref 0.40–1.20)
GFR: 55.54 mL/min — ABNORMAL LOW (ref >60.00)
Glucose, Bld: 125 mg/dL — ABNORMAL HIGH (ref 70–99)
Potassium: 3.7 mEq/L (ref 3.5–5.1)
Sodium: 141 mEq/L (ref 135–145)

## 2022-07-08 LAB — MAGNESIUM: Magnesium: 1.9 mg/dL (ref 1.5–2.5)

## 2022-07-14 ENCOUNTER — Other Ambulatory Visit: Payer: Self-pay | Admitting: Interventional Radiology

## 2022-07-14 ENCOUNTER — Other Ambulatory Visit (HOSPITAL_COMMUNITY): Payer: Self-pay | Admitting: Student

## 2022-07-14 ENCOUNTER — Encounter: Payer: Self-pay | Admitting: Neurology

## 2022-07-14 DIAGNOSIS — I82412 Acute embolism and thrombosis of left femoral vein: Secondary | ICD-10-CM

## 2022-07-15 ENCOUNTER — Other Ambulatory Visit: Payer: Self-pay

## 2022-07-15 ENCOUNTER — Telehealth: Payer: Self-pay

## 2022-07-15 DIAGNOSIS — E237 Disorder of pituitary gland, unspecified: Secondary | ICD-10-CM

## 2022-07-15 NOTE — Telephone Encounter (Signed)
Pt called stating she finished her abx for her CDIFF and was inquiring if she needs to come give another stool sample,called and LM prior pt informing her she didn't need to do another stool sample at this time but if she was still symptomatic Monday to call our office back.

## 2022-07-15 NOTE — Progress Notes (Signed)
Per Dr.Jaffe note 06/08/22, MRI of brain with and without contrast from 06/07/2022 appears to be overall stable compared to prior imaging from 2021.  There is mention of asymmetry of the pituitary gland.  This is of unknown significance.  For better visualization, I would like to order MRI of pituitary gland with and without contrast (reason:  abnormal pituitary)  Mri Brain W/WO Contrast Focus on Pituitary Gland. Order and will sent to [facility] as requested by patient need Open MRI

## 2022-07-16 ENCOUNTER — Other Ambulatory Visit: Payer: Self-pay | Admitting: Family Medicine

## 2022-07-16 ENCOUNTER — Other Ambulatory Visit: Payer: Self-pay | Admitting: Behavioral Health

## 2022-07-16 DIAGNOSIS — F331 Major depressive disorder, recurrent, moderate: Secondary | ICD-10-CM

## 2022-07-16 DIAGNOSIS — H8112 Benign paroxysmal vertigo, left ear: Secondary | ICD-10-CM

## 2022-07-16 DIAGNOSIS — F411 Generalized anxiety disorder: Secondary | ICD-10-CM

## 2022-07-18 NOTE — Telephone Encounter (Signed)
Patient is requesting a refill of the following medications: Requested Prescriptions   Pending Prescriptions Disp Refills   meclizine (ANTIVERT) 25 MG tablet [Pharmacy Med Name: MECLIZINE '25MG'$  RX TABLETS] 30 tablet 0    Sig: TAKE 1 TABLET(25 MG) BY MOUTH THREE TIMES DAILY AS NEEDED FOR DIZZINESS    Date of patient request: 07/18/22 Last office visit: 07/07/22 Date of last refill: 05/30/22 Last refill amount: 30

## 2022-07-22 ENCOUNTER — Inpatient Hospital Stay (HOSPITAL_COMMUNITY)
Admission: EM | Admit: 2022-07-22 | Discharge: 2022-07-25 | DRG: 689 | Disposition: A | Discharge status: Home / Self Care | Payer: Medicare Other | Attending: Internal Medicine | Admitting: Internal Medicine

## 2022-07-22 ENCOUNTER — Encounter: Payer: Self-pay | Admitting: Family Medicine

## 2022-07-22 ENCOUNTER — Other Ambulatory Visit: Payer: Self-pay

## 2022-07-22 ENCOUNTER — Ambulatory Visit (INDEPENDENT_AMBULATORY_CARE_PROVIDER_SITE_OTHER): Payer: Medicare Other | Admitting: Family Medicine

## 2022-07-22 ENCOUNTER — Encounter (HOSPITAL_COMMUNITY): Payer: Self-pay | Admitting: Emergency Medicine

## 2022-07-22 ENCOUNTER — Encounter: Payer: Self-pay | Admitting: Hematology & Oncology

## 2022-07-22 ENCOUNTER — Emergency Department (HOSPITAL_COMMUNITY): Payer: Medicare Other

## 2022-07-22 VITALS — BP 124/82 | HR 61 | Temp 98.1°F | Ht 66.0 in | Wt 223.2 lb

## 2022-07-22 DIAGNOSIS — R5382 Chronic fatigue, unspecified: Secondary | ICD-10-CM | POA: Diagnosis present

## 2022-07-22 DIAGNOSIS — F32A Depression, unspecified: Secondary | ICD-10-CM | POA: Diagnosis present

## 2022-07-22 DIAGNOSIS — Z95828 Presence of other vascular implants and grafts: Secondary | ICD-10-CM

## 2022-07-22 DIAGNOSIS — E876 Hypokalemia: Secondary | ICD-10-CM | POA: Diagnosis present

## 2022-07-22 DIAGNOSIS — B9620 Unspecified Escherichia coli [E. coli] as the cause of diseases classified elsewhere: Secondary | ICD-10-CM | POA: Diagnosis present

## 2022-07-22 DIAGNOSIS — R0600 Dyspnea, unspecified: Secondary | ICD-10-CM

## 2022-07-22 DIAGNOSIS — R110 Nausea: Secondary | ICD-10-CM

## 2022-07-22 DIAGNOSIS — M549 Dorsalgia, unspecified: Secondary | ICD-10-CM | POA: Diagnosis present

## 2022-07-22 DIAGNOSIS — Z79899 Other long term (current) drug therapy: Secondary | ICD-10-CM

## 2022-07-22 DIAGNOSIS — Z813 Family history of other psychoactive substance abuse and dependence: Secondary | ICD-10-CM

## 2022-07-22 DIAGNOSIS — N179 Acute kidney failure, unspecified: Secondary | ICD-10-CM | POA: Diagnosis present

## 2022-07-22 DIAGNOSIS — G250 Essential tremor: Secondary | ICD-10-CM | POA: Diagnosis present

## 2022-07-22 DIAGNOSIS — Z9049 Acquired absence of other specified parts of digestive tract: Secondary | ICD-10-CM

## 2022-07-22 DIAGNOSIS — G9341 Metabolic encephalopathy: Secondary | ICD-10-CM | POA: Diagnosis not present

## 2022-07-22 DIAGNOSIS — I82403 Acute embolism and thrombosis of unspecified deep veins of lower extremity, bilateral: Secondary | ICD-10-CM | POA: Diagnosis present

## 2022-07-22 DIAGNOSIS — R4182 Altered mental status, unspecified: Secondary | ICD-10-CM | POA: Diagnosis present

## 2022-07-22 DIAGNOSIS — Z90710 Acquired absence of both cervix and uterus: Secondary | ICD-10-CM

## 2022-07-22 DIAGNOSIS — Z86711 Personal history of pulmonary embolism: Secondary | ICD-10-CM

## 2022-07-22 DIAGNOSIS — R3915 Urgency of urination: Secondary | ICD-10-CM | POA: Diagnosis present

## 2022-07-22 DIAGNOSIS — Z881 Allergy status to other antibiotic agents status: Secondary | ICD-10-CM

## 2022-07-22 DIAGNOSIS — N390 Urinary tract infection, site not specified: Principal | ICD-10-CM | POA: Diagnosis present

## 2022-07-22 DIAGNOSIS — I10 Essential (primary) hypertension: Secondary | ICD-10-CM

## 2022-07-22 DIAGNOSIS — R296 Repeated falls: Secondary | ICD-10-CM | POA: Diagnosis present

## 2022-07-22 DIAGNOSIS — Z9181 History of falling: Secondary | ICD-10-CM

## 2022-07-22 DIAGNOSIS — E119 Type 2 diabetes mellitus without complications: Secondary | ICD-10-CM

## 2022-07-22 DIAGNOSIS — Z6836 Body mass index (BMI) 36.0-36.9, adult: Secondary | ICD-10-CM

## 2022-07-22 DIAGNOSIS — R479 Unspecified speech disturbances: Secondary | ICD-10-CM

## 2022-07-22 DIAGNOSIS — R519 Headache, unspecified: Secondary | ICD-10-CM

## 2022-07-22 DIAGNOSIS — E538 Deficiency of other specified B group vitamins: Secondary | ICD-10-CM | POA: Diagnosis present

## 2022-07-22 DIAGNOSIS — W19XXXA Unspecified fall, initial encounter: Secondary | ICD-10-CM | POA: Diagnosis present

## 2022-07-22 DIAGNOSIS — K7460 Unspecified cirrhosis of liver: Secondary | ICD-10-CM | POA: Diagnosis present

## 2022-07-22 DIAGNOSIS — I2510 Atherosclerotic heart disease of native coronary artery without angina pectoris: Secondary | ICD-10-CM | POA: Diagnosis present

## 2022-07-22 DIAGNOSIS — Z86011 Personal history of benign neoplasm of the brain: Secondary | ICD-10-CM

## 2022-07-22 DIAGNOSIS — R309 Painful micturition, unspecified: Secondary | ICD-10-CM

## 2022-07-22 DIAGNOSIS — Z7901 Long term (current) use of anticoagulants: Secondary | ICD-10-CM

## 2022-07-22 DIAGNOSIS — I700 Atherosclerosis of aorta: Secondary | ICD-10-CM | POA: Diagnosis present

## 2022-07-22 DIAGNOSIS — Z1152 Encounter for screening for COVID-19: Secondary | ICD-10-CM

## 2022-07-22 DIAGNOSIS — D500 Iron deficiency anemia secondary to blood loss (chronic): Secondary | ICD-10-CM | POA: Diagnosis present

## 2022-07-22 DIAGNOSIS — Z818 Family history of other mental and behavioral disorders: Secondary | ICD-10-CM

## 2022-07-22 DIAGNOSIS — G8929 Other chronic pain: Secondary | ICD-10-CM | POA: Diagnosis present

## 2022-07-22 DIAGNOSIS — R29818 Other symptoms and signs involving the nervous system: Secondary | ICD-10-CM

## 2022-07-22 DIAGNOSIS — R410 Disorientation, unspecified: Secondary | ICD-10-CM | POA: Diagnosis present

## 2022-07-22 DIAGNOSIS — K529 Noninfective gastroenteritis and colitis, unspecified: Secondary | ICD-10-CM

## 2022-07-22 DIAGNOSIS — E860 Dehydration: Secondary | ICD-10-CM | POA: Diagnosis present

## 2022-07-22 DIAGNOSIS — C786 Secondary malignant neoplasm of retroperitoneum and peritoneum: Secondary | ICD-10-CM | POA: Diagnosis present

## 2022-07-22 DIAGNOSIS — Z8619 Personal history of other infectious and parasitic diseases: Secondary | ICD-10-CM

## 2022-07-22 DIAGNOSIS — Z85038 Personal history of other malignant neoplasm of large intestine: Secondary | ICD-10-CM

## 2022-07-22 DIAGNOSIS — Z811 Family history of alcohol abuse and dependence: Secondary | ICD-10-CM

## 2022-07-22 DIAGNOSIS — Z880 Allergy status to penicillin: Secondary | ICD-10-CM

## 2022-07-22 DIAGNOSIS — M797 Fibromyalgia: Secondary | ICD-10-CM | POA: Diagnosis present

## 2022-07-22 DIAGNOSIS — E669 Obesity, unspecified: Secondary | ICD-10-CM | POA: Diagnosis present

## 2022-07-22 DIAGNOSIS — F419 Anxiety disorder, unspecified: Secondary | ICD-10-CM | POA: Diagnosis present

## 2022-07-22 DIAGNOSIS — C772 Secondary and unspecified malignant neoplasm of intra-abdominal lymph nodes: Secondary | ICD-10-CM | POA: Diagnosis present

## 2022-07-22 DIAGNOSIS — Z888 Allergy status to other drugs, medicaments and biological substances status: Secondary | ICD-10-CM

## 2022-07-22 DIAGNOSIS — Y92009 Unspecified place in unspecified non-institutional (private) residence as the place of occurrence of the external cause: Secondary | ICD-10-CM

## 2022-07-22 LAB — COMPREHENSIVE METABOLIC PANEL
ALT: 33 U/L (ref 0–44)
AST: 41 U/L (ref 15–41)
Albumin: 3.7 g/dL (ref 3.5–5.0)
Alkaline Phosphatase: 186 U/L — ABNORMAL HIGH (ref 38–126)
Anion gap: 9 (ref 5–15)
BUN: 11 mg/dL (ref 6–20)
CO2: 26 mmol/L (ref 22–32)
Calcium: 9.3 mg/dL (ref 8.9–10.3)
Chloride: 105 mmol/L (ref 98–111)
Creatinine, Ser: 1.09 mg/dL — ABNORMAL HIGH (ref 0.44–1.00)
GFR, Estimated: 59 mL/min — ABNORMAL LOW (ref >60)
Glucose, Bld: 140 mg/dL — ABNORMAL HIGH (ref 70–99)
Potassium: 3.5 mmol/L (ref 3.5–5.1)
Sodium: 140 mmol/L (ref 135–145)
Total Bilirubin: 0.6 mg/dL (ref 0.3–1.2)
Total Protein: 7.2 g/dL (ref 6.5–8.1)

## 2022-07-22 LAB — POCT URINALYSIS DIP (MANUAL ENTRY)
Blood, UA: NEGATIVE
Glucose, UA: NEGATIVE mg/dL
Ketones, POC UA: NEGATIVE mg/dL
Leukocytes, UA: NEGATIVE
Nitrite, UA: NEGATIVE
Spec Grav, UA: 1.02 (ref 1.010–1.025)
Urobilinogen, UA: NEGATIVE E.U./dL — AB
pH, UA: 6 (ref 5.0–8.0)

## 2022-07-22 LAB — CBC WITH DIFFERENTIAL/PLATELET
Abs Immature Granulocytes: 0.03 10*3/uL (ref 0.00–0.07)
Basophils Absolute: 0.1 10*3/uL (ref 0.0–0.1)
Basophils Relative: 1 %
Eosinophils Absolute: 0.2 10*3/uL (ref 0.0–0.5)
Eosinophils Relative: 3 %
HCT: 41.9 % (ref 36.0–46.0)
Hemoglobin: 13.4 g/dL (ref 12.0–15.0)
Immature Granulocytes: 0 %
Lymphocytes Relative: 36 %
Lymphs Abs: 2.6 10*3/uL (ref 0.7–4.0)
MCH: 30.7 pg (ref 26.0–34.0)
MCHC: 32 g/dL (ref 30.0–36.0)
MCV: 96.1 fL (ref 80.0–100.0)
Monocytes Absolute: 0.7 10*3/uL (ref 0.1–1.0)
Monocytes Relative: 10 %
Neutro Abs: 3.7 10*3/uL (ref 1.7–7.7)
Neutrophils Relative %: 50 %
Platelets: 268 10*3/uL (ref 150–400)
RBC: 4.36 MIL/uL (ref 3.87–5.11)
RDW: 18.3 % — ABNORMAL HIGH (ref 11.5–15.5)
WBC: 7.3 10*3/uL (ref 4.0–10.5)
nRBC: 0 % (ref 0.0–0.2)

## 2022-07-22 LAB — TROPONIN I (HIGH SENSITIVITY)
Troponin I (High Sensitivity): 5 ng/L (ref <18)
Troponin I (High Sensitivity): 5 ng/L (ref <18)

## 2022-07-22 LAB — PROTIME-INR
INR: 1 (ref 0.8–1.2)
Prothrombin Time: 12.9 seconds (ref 11.4–15.2)

## 2022-07-22 LAB — URINALYSIS, ROUTINE W REFLEX MICROSCOPIC
Glucose, UA: NEGATIVE mg/dL
Hgb urine dipstick: NEGATIVE
Ketones, ur: NEGATIVE mg/dL
Nitrite: NEGATIVE
Protein, ur: 30 mg/dL — AB
Specific Gravity, Urine: 1.026 (ref 1.005–1.030)
pH: 5 (ref 5.0–8.0)

## 2022-07-22 LAB — AMMONIA: Ammonia: 44 umol/L — ABNORMAL HIGH (ref 9–35)

## 2022-07-22 MED ORDER — IOHEXOL 350 MG/ML SOLN
75.0000 mL | Freq: Once | INTRAVENOUS | Status: AC | PRN
  Administered 2022-07-22: 75 mL via INTRAVENOUS

## 2022-07-22 MED ORDER — CHLORHEXIDINE GLUCONATE CLOTH 2 % EX PADS
6.0000 | MEDICATED_PAD | Freq: Every day | CUTANEOUS | Status: DC
  Administered 2022-07-24 – 2022-07-25 (×2): 6 via TOPICAL

## 2022-07-22 MED ORDER — SODIUM CHLORIDE 0.9% FLUSH
10.0000 mL | Freq: Two times a day (BID) | INTRAVENOUS | Status: DC
  Administered 2022-07-23 – 2022-07-25 (×3): 10 mL

## 2022-07-22 MED ORDER — SODIUM CHLORIDE 0.9% FLUSH: 10.0000 mL | INTRAVENOUS | Status: DC | PRN

## 2022-07-22 NOTE — ED Provider Triage Note (Signed)
Emergency Medicine Provider Triage Evaluation Note  Wendy Barber , a 61 y.o. female  was evaluated in triage.  Pt complains of multiple complaints.  Patient has known DVTs, history of cirrhosis.  She has had multiple falls.  She has increased chest pain and shortness of breath.  She is on Lovenox.  She has nonalcoholic cirrhosis and has had hyperammonemia in the past and has been more confused with difficulty finding her words.  Sent in by her PCP for further evaluation..  Review of Systems  Positive: Chest pain shortness of breath Negative: Fever  Physical Exam  BP (!) 180/119 (BP Location: Right Arm)   Pulse 71   Temp 99 F (37.2 C) (Oral)   Resp 18   SpO2 95%  Gen:   Awake, no distress   Resp:  Normal effort  MSK:   Moves extremities without difficulty  Other:    Medical Decision Making  Medically screening exam initiated at 6:21 PM.  Appropriate orders placed.  Gordon Carlson was informed that the remainder of the evaluation will be completed by another provider, this initial triage assessment does not replace that evaluation, and the importance of remaining in the ED until their evaluation is complete.  Work-up initiated   Margarita Mail, PA-C 07/22/22 1837

## 2022-07-22 NOTE — Patient Instructions (Signed)
Go to ER after leaving our office.

## 2022-07-22 NOTE — ED Triage Notes (Signed)
Pt sent here by PCP. Pt has had low back pain and increased intermittent confusion over the last 2 weeks. Per husband pt had a fall on Wednesday, has bruising to L breast. Pt has hx of CA and is on lovenox for blood clots. Pt has known DVT, reports some shob and increased abd swelling.

## 2022-07-22 NOTE — ED Notes (Signed)
Pts port accessed by IV team, CT made aware

## 2022-07-22 NOTE — Progress Notes (Signed)
Subjective:  Patient ID: Wendy Barber, female    DOB: 1961/02/09  Age: 61 y.o. MRN: [id number]  CC:  Chief Complaint  Patient presents with   Hypokalemia   Anemia   Depression   Referral    Pt wants a dermatology appt   Dysuria         HPI Wendy Barber presents for   Multiple concerns above, last visit September 14th. Hospital follow-up at that time.   Hypokalemia: Stable on last labs.  Had been low previously, recommended starting 40 mill equivalent supplement initially, then 20 mEq daily of potassium.  Magnesium was normal. Has been taking 96mq qd. Lab Results  Component Value Date   NA 141 07/07/2022   K 3.7 07/07/2022   CL 103 07/07/2022   CO2 29 07/07/2022   Anemia Status posttransfusion, IV iron with recent hospitalization.  Updated CBC last visit -hemoglobin normal.  Lab Results  Component Value Date   WBC 7.6 07/04/2022   HGB 12.5 07/04/2022   HCT 39.1 07/04/2022   MCV 94.7 07/04/2022   PLT 272 07/04/2022   Depression with anxiety Discussed last visit, continued on same regimen at that time.  No suicidal intent or plan.  Understandable depression symptoms with health issues and recent hospitalization.  Plan to continue to meet with therapist. Stable.  DVT -bilateral lower extremity Returned to twice daily Lovenox -see last visit. No new bleeding/bruising.   Urinary tract infection, confusion.  Treated at her last hospitalization with 5-day course of Rocephin.  Urine culture with multiple species, further antibiotics were deferred.  No urinary symptoms at her last visit on September 14.  Appetite and energy had been doing better until recently.  Recently has had onset of burning with urination, urgency, with some difficulty staring urination past 10-12 days. Able to produce urine.   No fever. Some abdominal pain - lower. Some back pain past week.  Some confusion past 1 week., unable to think of right words, trouble gathering thoughts, trouble  multitasking.  No focal weakness, facial droop or slurred speech - just slowed speech.  Fall 2 days ago - dizzy spell walking to bathroom - body was leaning to left- off balance, but not focally weak. No injury. Caught self.  mouth is dry. Lips burning. Drinking fluids - water.no change in intake recently.  Here with spouse - few years ago had confusion with abnormal liver tests. Headaches have been going on for past few weeks. Not typical for her. Increased sleepiness yesterday.  Some shortness of breath with activity. New in past week. No chest pain.dyspnea past few days.  Sore on R low back   History Patient Active Problem List   Diagnosis Date Noted   Hemorrhoids 06/25/2022   Occasional tremors: Essential tremors 06/23/2022   Hypomagnesemia 06/22/2022   Iron deficiency anemia    Gastritis 06/18/2022   Depression 06/18/2022   UTI (urinary tract infection) 06/16/2022   DVT, bilateral lower limbs ([place] 06/14/2022   Symptomatic bradycardia 06/13/2022   History of excision of intestinal structure 06/13/2022   CAD (coronary artery disease) 06/13/2022   Acute lower GI bleeding 05/04/2022   AKI (acute kidney injury) ([facility] 05/04/2022   History of deep vein thrombosis (DVT) of lower extremity 05/04/2022   History of pulmonary embolus (PE) 05/04/2022   Anxiety 05/04/2022   Pernicious anemia 05/14/2021   Iron deficiency anemia due to chronic blood loss 05/14/2021   Abdominal wall abscess    Surgical wound infection 01/16/2021   C. 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) 03/19/2020   Goals of care, counseling/discussion 03/19/2020   Malignant pseudomyxoma peritonei ([place]) 03/19/2020   DVT of deep femoral vein, left ([place]) 03/19/2020   Pulmonary embolism, bilateral ([place]) 03/19/2020   Presence of IVC filter 03/19/2020   Hepatic encephalopathy ([place]) 11/22/2019    Confusion 11/21/2019   Autoimmune hepatitis ([place]) 11/21/2019   HTN (hypertension) 11/21/2019   DM2 (diabetes mellitus, type 2) ([place]) 11/21/2019   AMS (altered mental status) 11/21/2019   Closed right ankle fracture 08/27/2019   Acute deep vein thrombosis (DVT) of femoral vein of left lower extremity ([place]) 08/03/2018   History of colon cancer 08/03/2018   History of partial colectomy 08/03/2018   Spinal stenosis 08/03/2018   Morbid obesity ([place]) 08/03/2018   Cerebral meningioma ([place]) 01/05/2018   Arthritis of right shoulder region 02/18/2017   History of renal calculi 02/14/2016   Midline low back pain 02/14/2016   Class 3 obesity ([place]) 01/07/2016   Migraine with aura and without status migrainosus, not intractable 01/07/2016   Chronic fatigue syndrome 09/23/2013   GERD (gastroesophageal reflux disease) 09/23/2013   History of hepatitis A 09/23/2013   Rheumatoid arthritis involving multiple sites ([place]) 09/23/2013   Past Medical History:  Diagnosis Date   Arthritis    Back pain    Cancer ([place])    pseudomyxoma peritonei   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   Chronic fatigue syndrome    Diabetes mellitus without complication (HCC)    DVT of deep femoral vein, left ([place]) 03/19/2020   Fibromyalgia    Goals of care, counseling/discussion 03/19/2020   Hypertension    Iron deficiency anemia due to chronic blood loss 05/14/2021   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   Pernicious anemia 05/14/2021   Presence of IVC filter 03/19/2020   Pulmonary embolism, bilateral ([place]) 03/19/2020   Past Surgical History:  Procedure Laterality Date   ABDOMINAL HYSTERECTOMY     ABDOMINAL SURGERY     ACHILLES TENDON REPAIR     APPENDECTOMY     ARTHROPLASTY     CARPAL TUNNEL RELEASE     CHOLECYSTECTOMY     IR CATHETER TUBE CHANGE  02/02/2021   IR CATHETER TUBE CHANGE  02/25/2021   IR IMAGING GUIDED PORT INSERTION  06/20/2022   IR RADIOLOGIST  EVAL & MGMT  02/24/2021   IR RADIOLOGIST EVAL & MGMT  03/10/2021   IR RADIOLOGIST EVAL & MGMT  06/22/2022   IR THROMBECT VENO MECH MOD SED  06/20/2022   IR US GUIDE BX ASP/DRAIN  11/25/2019   IR US GUIDE [place] LEFT  06/20/2022   IR US GUIDE [place] RIGHT  06/20/2022   IR US GUIDE [place] RIGHT  06/20/2022   IR VENO/EXT/BI  06/20/2022   IR VENOCAVAGRAM IVC  06/20/2022   JOINT REPLACEMENT     KNEE ARTHROSCOPY     ORIF ANKLE FRACTURE Right 08/27/2019   Procedure: OPEN REDUCTION INTERNAL FIXATION RIGHT ANKLE FRACTURE;  Surgeon: Renette Butters, MD;  Location: [facility];  Service: Orthopedics;  Laterality: Right;   perineorrophy     TONGUE BIOPSY     Allergies  Allergen Reactions   Penicillins Shortness Of Breath    Other reaction(s): Irregular Heart Rate, Other (See Comments) Rapid heartrate    Alprazolam Hives and Other (See Comments)    Hard to arouse, unresponsiveness  Ativan [Lorazepam] Other (See Comments)    Note: tolerates midazolam fine Face & Throat Swelling.   Corticosteroids Other (See Comments)    Other reaction(s): Other (see comments) Psychotic behaviour     Erythromycin     Other reaction(s): Other (See Comments) Severe stomach pain    Prednisone Other (See Comments)    Anxiety & Nervous Breakdown.   Savella  [Milnacipran]    Prednisolone Anxiety   Prior to Admission medications   Medication Sig Start Date End Date Taking? Authorizing Provider  buPROPion (WELLBUTRIN XL) 150 MG 24 hr tablet TAKE 1 TABLET DAILY 07/11/22  Yes White, Brian A, NP  busPIRone (BUSPAR) 15 MG tablet Take 1 tablet (15 mg total) by mouth 3 (three) times daily. 04/05/22  Yes Elwanda Brooklyn, NP  citalopram (CELEXA) 40 MG tablet TAKE 1 TABLET DAILY 07/11/22  Yes White, Brian A, NP  diphenoxylate-atropine (LOMOTIL) 2.5-0.025 MG tablet TAKE 2 TABLETS IN THE MORNING, AT NOON, IN THE EVENING AND AT BEDTIME Patient taking differently: Take 2 tablets by mouth 4 (four) times daily. 04/22/22  Yes  Volanda Napoleon, MD  enoxaparin (LOVENOX) 100 MG/ML injection Inject 1 mL (100 mg total) into the skin 2 (two) times daily. Patient taking differently: Inject 100 mg into the skin daily. Has been taking daily since 06/30/22. 06/24/22  Yes Eugenie Filler, MD  eszopiclone (LUNESTA) 2 MG TABS tablet TAKE 1 TABLET(2 MG) BY MOUTH AT BEDTIME AS NEEDED FOR SLEEP Patient taking differently: Take 2 mg by mouth at bedtime. 06/07/22  Yes White, Aaron Edelman A, NP  famotidine (PEPCID) 20 MG tablet TAKE 2 TABLETS TWICE A DAY Patient taking differently: Take 40 mg by mouth 2 (two) times daily. 03/01/22  Yes Ennever, Rudell Cobb, MD  hydrocortisone (ANUSOL-HC) 2.5 % rectal cream Place rectally 4 (four) times daily as needed for hemorrhoids or anal itching. Use 4 times daily x5 days, then 4 times daily as needed. 06/26/22  Yes Eugenie Filler, MD  loperamide (IMODIUM) 2 MG capsule Take 2 capsules (4 mg total) by mouth 4 (four) times daily as needed for diarrhea or loose stools. 05/08/22  Yes Aline August, MD  meclizine (ANTIVERT) 25 MG tablet TAKE 1 TABLET(25 MG) BY MOUTH THREE TIMES DAILY AS NEEDED FOR DIZZINESS 07/19/22  Yes [name], MD  ondansetron (ZOFRAN) 8 MG tablet Take 8 mg by mouth every 8 (eight) hours as needed for nausea. 03/03/21  Yes [provider]  orphenadrine (NORFLEX) 100 MG tablet TAKE 1 TABLET(100 MG) BY MOUTH AT BEDTIME AS NEEDED FOR MUSCLE SPASMS 06/06/22  Yes Ennever, Rudell Cobb, MD  oxyCODONE (ROXICODONE) 5 MG immediate release tablet Take 1 tablet (5 mg total) by mouth every 4 (four) hours as needed for severe pain or breakthrough pain. 06/04/22  Yes Shah, Pratik D, DO  pantoprazole (PROTONIX) 40 MG tablet Take 40 mg by mouth every morning. 01/01/21  Yes [provider]  potassium chloride (KLOR-CON M) 10 MEQ tablet Take 2 tablets (20 mEq total) by mouth daily. 07/07/22  Yes [name], MD  pramoxine-hydrocortisone (PROCTOCREAM-HC) 1-1 % rectal cream Place 1 Application  rectally 2 (two) times daily. 07/04/22  Yes Ennever, Rudell Cobb, MD  promethazine (PHENERGAN) 12.5 MG tablet Take 1 tablet (12.5 mg total) by mouth 2 (two) times daily as needed. Patient taking differently: Take 12.5 mg by mouth 2 (two) times daily as needed for nausea. 04/21/22  Yes [name], MD  propranolol (INDERAL) 20 MG tablet Take 1  tablet (20 mg total) by mouth 2 (two) times daily. 04/21/22  Yes [name], MD  alum & mag hydroxide-simeth (MAALOX/MYLANTA) 200-200-20 MG/5ML suspension Take 15 mLs by mouth every 6 (six) hours as needed for indigestion or heartburn. Patient not taking: Reported on 07/22/2022 05/08/22   Aline August, MD  diazepam (VALIUM) 5 MG tablet Take 1 tablet 30-40 minutes prior to MRI Patient not taking: Reported on 07/04/2022 06/13/22   Pieter Partridge, DO  dicyclomine (BENTYL) 20 MG tablet Take 1 tablet (20 mg total) by mouth in the morning, at noon, in the evening, and at bedtime. 05/08/22   Aline August, MD  ferrous sulfate 325 (65 FE) MG tablet Take 1 tablet (325 mg total) by mouth daily. Patient not taking: Reported on 07/07/2022 06/03/22 06/03/23  Heath Lark D, DO  fidaxomicin (DIFICID) 200 MG TABS tablet Take 1 tablet (200 mg total) by mouth 2 (two) times daily. Patient not taking: Reported on 07/22/2022 07/06/22   Volanda Napoleon, MD  folic acid (FOLVITE) 1 MG tablet Take 2 tablets (2 mg total) by mouth daily. Patient not taking: Reported on 07/07/2022 05/09/22   Aline August, MD  traMADol (ULTRAM) 50 MG tablet Take 1 tablet (50 mg total) by mouth every 6 (six) hours as needed for moderate pain. Patient not taking: Reported on 07/04/2022 05/08/22   Aline August, MD   Social History   Socioeconomic History   Marital status: Married    Spouse name: Not on file   Number of children: 3   Years of education: Not on file   Highest education level: Some college, no degree  Occupational History   Occupation: disability  Tobacco Use   Smoking status: Never    Smokeless tobacco: Never  Vaping Use   Vaping Use: Never used  Substance and Sexual Activity   Alcohol use: No   Drug use: Never   Sexual activity: Yes    Birth control/protection: None    Comment: hysterectomy  Other Topics Concern   Not on file  Social History Narrative   Right handed   One story home   Drinks occasional caffeine   Social Determinants of Health   Financial Resource Strain: Not on file  Food Insecurity: Not on file  Transportation Needs: Not on file  Physical Activity: Not on file  Stress: Not on file  Social Connections: Not on file  Intimate Partner Violence: Not on file    Review of Systems Per HPI.   Objective:   Vitals:   07/22/22 0856  BP: 124/82  Pulse: 61  Temp: 98.1 F (36.7 C)  SpO2: 96%  Weight: 223 lb 3.2 oz (101.2 kg)  Height: '5\' 6"'$  (1.676 m)     Physical Exam Vitals reviewed.  Constitutional:      Appearance: Normal appearance. She is well-developed. She is not toxic-appearing or diaphoretic.     Comments: Slow speech at times, with some delay in producing words, but no slurred speech.   HENT:     Head: Normocephalic and atraumatic.  Eyes:     Conjunctiva/sclera: Conjunctivae normal.     Pupils: Pupils are equal, round, and reactive to light.  Neck:     Vascular: No carotid bruit.  Cardiovascular:     Rate and Rhythm: Normal rate and regular rhythm.     Heart sounds: Normal heart sounds.  Pulmonary:     Effort: Pulmonary effort is normal.     Breath sounds: Normal breath sounds.  Abdominal:  Palpations: Abdomen is soft. There is no pulsatile mass.     Tenderness: There is no abdominal tenderness.     Comments: No appreciable focal abdominal tenderness.  Musculoskeletal:     Right lower leg: No edema.     Left lower leg: No edema.     Comments: Discomfort right lower paraspinals, no CVA tenderness.  Skin:    General: Skin is warm and dry.  Neurological:     Mental Status: She is alert.     Comments:  Responding to questions but slow speech as above.  No focal weakness appreciated, no facial droop, equal grip strength.  No pronator drift.  Unsteady with standing and ambulation, leans to right side.  Psychiatric:        Mood and Affect: Mood normal.        Behavior: Behavior normal.      Results for orders placed or performed in visit on 07/22/22  POCT urinalysis dipstick  Result Value Ref Range   Color, UA other (A) yellow   Clarity, UA clear clear   Glucose, UA negative negative mg/dL   Bilirubin, UA large (A) negative   Ketones, POC UA negative negative mg/dL   Spec Grav, UA 1.020 1.010 - 1.025   Blood, UA negative negative   pH, UA 6.0 5.0 - 8.0   Protein Ur, POC trace (A) negative mg/dL   Urobilinogen, UA negative (A) 0.2 or 1.0 E.U./dL   Nitrite, UA Negative Negative   Leukocytes, UA Negative Negative     Assessment & Plan:  Acquanetta Cabanilla is a 61 y.o. female . Confusion  Painful urination - Plan: POCT urinalysis dipstick  Hypokalemia  Chronic nausea  History of fall  Difficulty balancing  Difficulty with speech  New onset headache  Dyspnea, unspecified type  Leg DVT (deep venous thromboembolism), acute, bilateral (HCC)  New onset urgency, dysuria past 2 weeks, progressed to some disorientation, trouble with speech, balance difficulty as above with recent fall without injury.  New headaches as above.  Also with dry mouth, burning in mouth, and recent dyspnea.  May have multiple contributors to the symptoms but concern for new onset headache, balance changes and speech issues for intracerebral process, reports previous similar symptoms with possible hepatic encephalopathy.  Most recent LFTs were reassuring.  New onset dyspnea concerning given history of DVTs, but has been compliant with Lovenox.  In office urinalysis overall reassuring except for bilirubin.  Less likely infection.  -ER evaluation recommended for further testing, possible neuroimaging,  electrolytes, further evaluation for mental status change, balance issues, headache, dyspnea, urinary symptoms.   No orders of the defined types were placed in this encounter.  Patient Instructions  Go to ER after leaving our office.     Signed,   Merri Ray, MD [place] 07/22/22 9:43 AM

## 2022-07-23 ENCOUNTER — Observation Stay (HOSPITAL_COMMUNITY): Payer: Medicare Other

## 2022-07-23 ENCOUNTER — Emergency Department (HOSPITAL_COMMUNITY): Payer: Medicare Other

## 2022-07-23 DIAGNOSIS — R197 Diarrhea, unspecified: Secondary | ICD-10-CM

## 2022-07-23 DIAGNOSIS — E669 Obesity, unspecified: Secondary | ICD-10-CM | POA: Diagnosis present

## 2022-07-23 DIAGNOSIS — I82409 Acute embolism and thrombosis of unspecified deep veins of unspecified lower extremity: Secondary | ICD-10-CM

## 2022-07-23 DIAGNOSIS — D500 Iron deficiency anemia secondary to blood loss (chronic): Secondary | ICD-10-CM | POA: Diagnosis present

## 2022-07-23 DIAGNOSIS — E860 Dehydration: Secondary | ICD-10-CM | POA: Diagnosis present

## 2022-07-23 DIAGNOSIS — Z85038 Personal history of other malignant neoplasm of large intestine: Secondary | ICD-10-CM | POA: Diagnosis not present

## 2022-07-23 DIAGNOSIS — E119 Type 2 diabetes mellitus without complications: Secondary | ICD-10-CM | POA: Diagnosis present

## 2022-07-23 DIAGNOSIS — E876 Hypokalemia: Secondary | ICD-10-CM | POA: Diagnosis present

## 2022-07-23 DIAGNOSIS — W19XXXA Unspecified fall, initial encounter: Secondary | ICD-10-CM | POA: Diagnosis present

## 2022-07-23 DIAGNOSIS — N179 Acute kidney failure, unspecified: Secondary | ICD-10-CM | POA: Diagnosis present

## 2022-07-23 DIAGNOSIS — Z95828 Presence of other vascular implants and grafts: Secondary | ICD-10-CM | POA: Diagnosis not present

## 2022-07-23 DIAGNOSIS — I10 Essential (primary) hypertension: Secondary | ICD-10-CM | POA: Diagnosis present

## 2022-07-23 DIAGNOSIS — N390 Urinary tract infection, site not specified: Secondary | ICD-10-CM | POA: Diagnosis present

## 2022-07-23 DIAGNOSIS — I700 Atherosclerosis of aorta: Secondary | ICD-10-CM | POA: Diagnosis present

## 2022-07-23 DIAGNOSIS — E538 Deficiency of other specified B group vitamins: Secondary | ICD-10-CM | POA: Diagnosis present

## 2022-07-23 DIAGNOSIS — B9620 Unspecified Escherichia coli [E. coli] as the cause of diseases classified elsewhere: Secondary | ICD-10-CM | POA: Diagnosis present

## 2022-07-23 DIAGNOSIS — R410 Disorientation, unspecified: Secondary | ICD-10-CM

## 2022-07-23 DIAGNOSIS — K7460 Unspecified cirrhosis of liver: Secondary | ICD-10-CM | POA: Diagnosis present

## 2022-07-23 DIAGNOSIS — Y92009 Unspecified place in unspecified non-institutional (private) residence as the place of occurrence of the external cause: Secondary | ICD-10-CM

## 2022-07-23 DIAGNOSIS — Z1152 Encounter for screening for COVID-19: Secondary | ICD-10-CM | POA: Diagnosis not present

## 2022-07-23 DIAGNOSIS — K529 Noninfective gastroenteritis and colitis, unspecified: Secondary | ICD-10-CM

## 2022-07-23 DIAGNOSIS — G250 Essential tremor: Secondary | ICD-10-CM | POA: Diagnosis present

## 2022-07-23 DIAGNOSIS — F32A Depression, unspecified: Secondary | ICD-10-CM | POA: Diagnosis present

## 2022-07-23 DIAGNOSIS — I82403 Acute embolism and thrombosis of unspecified deep veins of lower extremity, bilateral: Secondary | ICD-10-CM | POA: Diagnosis present

## 2022-07-23 DIAGNOSIS — M797 Fibromyalgia: Secondary | ICD-10-CM | POA: Diagnosis present

## 2022-07-23 DIAGNOSIS — G9341 Metabolic encephalopathy: Secondary | ICD-10-CM | POA: Diagnosis present

## 2022-07-23 DIAGNOSIS — R4182 Altered mental status, unspecified: Secondary | ICD-10-CM | POA: Diagnosis present

## 2022-07-23 DIAGNOSIS — R296 Repeated falls: Secondary | ICD-10-CM | POA: Diagnosis present

## 2022-07-23 DIAGNOSIS — C181 Malignant neoplasm of appendix: Secondary | ICD-10-CM

## 2022-07-23 DIAGNOSIS — C772 Secondary and unspecified malignant neoplasm of intra-abdominal lymph nodes: Secondary | ICD-10-CM

## 2022-07-23 DIAGNOSIS — C786 Secondary malignant neoplasm of retroperitoneum and peritoneum: Secondary | ICD-10-CM | POA: Diagnosis present

## 2022-07-23 DIAGNOSIS — F419 Anxiety disorder, unspecified: Secondary | ICD-10-CM | POA: Diagnosis present

## 2022-07-23 LAB — RAPID URINE DRUG SCREEN, HOSP PERFORMED
Amphetamines: NOT DETECTED
Barbiturates: NOT DETECTED
Benzodiazepines: NOT DETECTED
Cocaine: NOT DETECTED
Opiates: NOT DETECTED
Tetrahydrocannabinol: NOT DETECTED

## 2022-07-23 LAB — VITAMIN B12: Vitamin B-12: 191 pg/mL (ref 180–914)

## 2022-07-23 LAB — SARS CORONAVIRUS 2 BY RT PCR: SARS Coronavirus 2 by RT PCR: NEGATIVE

## 2022-07-23 LAB — TSH: TSH: 3.789 u[IU]/mL (ref 0.350–4.500)

## 2022-07-23 LAB — CBG MONITORING, ED
Glucose-Capillary: 114 mg/dL — ABNORMAL HIGH (ref 70–99)
Glucose-Capillary: 149 mg/dL — ABNORMAL HIGH (ref 70–99)
Glucose-Capillary: 128 mg/dL — ABNORMAL HIGH (ref 70–99)

## 2022-07-23 MED ORDER — INSULIN ASPART 100 UNIT/ML IJ SOLN: 0.0000 [IU] | Freq: Every day | INTRAMUSCULAR | Status: DC

## 2022-07-23 MED ORDER — ONDANSETRON HCL 4 MG/2ML IJ SOLN
4.0000 mg | Freq: Once | INTRAMUSCULAR | Status: AC
  Administered 2022-07-23: 4 mg via INTRAVENOUS
  Filled 2022-07-23: qty 2

## 2022-07-23 MED ORDER — ENOXAPARIN SODIUM 100 MG/ML IJ SOSY
100.0000 mg | PREFILLED_SYRINGE | Freq: Two times a day (BID) | INTRAMUSCULAR | Status: DC
  Administered 2022-07-23 – 2022-07-25 (×4): 100 mg via SUBCUTANEOUS
  Filled 2022-07-23 (×7): qty 1

## 2022-07-23 MED ORDER — FERROUS SULFATE 325 (65 FE) MG PO TABS
325.0000 mg | ORAL_TABLET | Freq: Every day | ORAL | Status: DC
  Administered 2022-07-23 – 2022-07-25 (×3): 325 mg via ORAL
  Filled 2022-07-23 (×3): qty 1

## 2022-07-23 MED ORDER — SODIUM CHLORIDE 0.9 % IV SOLN
12.5000 mg | Freq: Once | INTRAVENOUS | Status: AC | PRN
  Administered 2022-07-23: 12.5 mg via INTRAVENOUS
  Filled 2022-07-23: qty 0.5

## 2022-07-23 MED ORDER — HYDROXYZINE HCL 10 MG PO TABS
10.0000 mg | ORAL_TABLET | Freq: Once | ORAL | Status: AC | PRN
  Administered 2022-07-23: 10 mg via ORAL
  Filled 2022-07-23: qty 1

## 2022-07-23 MED ORDER — ACETAMINOPHEN 650 MG RE SUPP: 650.0000 mg | Freq: Four times a day (QID) | RECTAL | Status: DC | PRN

## 2022-07-23 MED ORDER — INSULIN ASPART 100 UNIT/ML IJ SOLN
0.0000 [IU] | Freq: Three times a day (TID) | INTRAMUSCULAR | Status: DC
  Administered 2022-07-23 (×2): 1 [IU] via SUBCUTANEOUS
  Administered 2022-07-24: 2 [IU] via SUBCUTANEOUS
  Administered 2022-07-24: 1 [IU] via SUBCUTANEOUS
  Administered 2022-07-25: 2 [IU] via SUBCUTANEOUS
  Administered 2022-07-25: 1 [IU] via SUBCUTANEOUS

## 2022-07-23 MED ORDER — SODIUM CHLORIDE 0.9 % IV SOLN
12.5000 mg | Freq: Four times a day (QID) | INTRAVENOUS | Status: AC | PRN
  Administered 2022-07-23: 12.5 mg via INTRAVENOUS
  Filled 2022-07-23 (×3): qty 0.5

## 2022-07-23 MED ORDER — LIDOCAINE 5 % EX PTCH
1.0000 | MEDICATED_PATCH | CUTANEOUS | Status: DC
  Administered 2022-07-23 – 2022-07-25 (×4): 1 via TRANSDERMAL
  Filled 2022-07-23 (×2): qty 1

## 2022-07-23 MED ORDER — BUSPIRONE HCL 5 MG PO TABS
15.0000 mg | ORAL_TABLET | Freq: Three times a day (TID) | ORAL | Status: DC
  Administered 2022-07-23 – 2022-07-25 (×7): 15 mg via ORAL
  Filled 2022-07-23: qty 1
  Filled 2022-07-23: qty 2
  Filled 2022-07-23 (×4): qty 1
  Filled 2022-07-23: qty 2

## 2022-07-23 MED ORDER — HYDROMORPHONE HCL 1 MG/ML IJ SOLN
0.5000 mg | INTRAMUSCULAR | Status: DC | PRN
  Administered 2022-07-23 – 2022-07-25 (×8): 0.5 mg via INTRAVENOUS
  Filled 2022-07-23 (×2): qty 0.5
  Filled 2022-07-23: qty 1
  Filled 2022-07-23 (×5): qty 0.5

## 2022-07-23 MED ORDER — HYDRALAZINE HCL 20 MG/ML IJ SOLN
5.0000 mg | Freq: Four times a day (QID) | INTRAMUSCULAR | Status: DC | PRN
  Administered 2022-07-23: 5 mg via INTRAVENOUS
  Filled 2022-07-23: qty 1

## 2022-07-23 MED ORDER — PROPRANOLOL HCL 20 MG PO TABS
20.0000 mg | ORAL_TABLET | Freq: Two times a day (BID) | ORAL | Status: DC
  Administered 2022-07-23 – 2022-07-25 (×5): 20 mg via ORAL
  Filled 2022-07-23 (×6): qty 1
  Filled 2022-07-23: qty 2

## 2022-07-23 MED ORDER — CITALOPRAM HYDROBROMIDE 20 MG PO TABS
40.0000 mg | ORAL_TABLET | Freq: Every day | ORAL | Status: DC
  Administered 2022-07-23 – 2022-07-25 (×3): 40 mg via ORAL
  Filled 2022-07-23 (×2): qty 2
  Filled 2022-07-23: qty 4

## 2022-07-23 MED ORDER — ONDANSETRON HCL 4 MG/2ML IJ SOLN
4.0000 mg | Freq: Four times a day (QID) | INTRAMUSCULAR | Status: DC | PRN
  Administered 2022-07-23 – 2022-07-25 (×3): 4 mg via INTRAVENOUS
  Filled 2022-07-23 (×2): qty 2

## 2022-07-23 MED ORDER — DIAZEPAM 5 MG PO TABS
10.0000 mg | ORAL_TABLET | Freq: Once | ORAL | Status: AC
  Administered 2022-07-23: 10 mg via ORAL
  Filled 2022-07-23: qty 2

## 2022-07-23 MED ORDER — ACETAMINOPHEN 325 MG PO TABS
650.0000 mg | ORAL_TABLET | Freq: Four times a day (QID) | ORAL | Status: DC | PRN
  Administered 2022-07-23 – 2022-07-24 (×2): 650 mg via ORAL
  Filled 2022-07-23 (×2): qty 2

## 2022-07-23 MED ORDER — BUPROPION HCL ER (XL) 150 MG PO TB24
150.0000 mg | ORAL_TABLET | Freq: Every day | ORAL | Status: DC
  Administered 2022-07-23 – 2022-07-25 (×3): 150 mg via ORAL
  Filled 2022-07-23 (×3): qty 1

## 2022-07-23 MED ORDER — OXYCODONE HCL 5 MG PO TABS
5.0000 mg | ORAL_TABLET | Freq: Once | ORAL | Status: DC | PRN
  Filled 2022-07-23: qty 1

## 2022-07-23 NOTE — H&P (Signed)
History and Physical    Wendy Barber:[id number] DOB: 05-15-1961 DOA: 07/22/2022  PCP: [name], MD  Patient coming from: Home  Chief Complaint: Altered mental status  HPI: Wendy Barber is a 61 y.o. 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  Recently admitted 8/21-9/3 after multiple syncopal episodes at home and work-up revealed extensive DVT involving bilateral lower extremities and inferior vena cava.  Underwent mechanical thrombolysis.  IR had initially planned to remove IVC filter, however, procedure canceled as residual clot noted in the femoral vein.  Discharged on full dose Lovenox.  She was also treated for UTI and AKI.  She received 5 units PRBCs during this hospitalization for blood loss anemia.  She presents to the ED today for evaluation of confusion and multiple falls.  Hypertensive on arrival to the ED with blood pressure 180/119, remainder of vital signs stable.  Labs showing no leukocytosis, hemoglobin 13.4 (improved compared to prior labs), glucose 140, creatinine 1.0 (stable), alkaline phosphatase 186 with normal remainder of LFTs, troponin negative x2, ammonia 44, COVID test pending.  UA showing negative nitrite, small amount of leukocytes, and microscopy showing 11-20 WBCs and rare bacteria.  CT head showing findings consistent with the patient's calcified left posterior fossa meningioma and no acute intracranial abnormality.  CTA chest negative for PE or any other acute intrathoracic process.  CT renal stone study showing findings consistent with enteritis involving multiple small bowel  loops within the mid and lower abdomen. No medications administered in the ED.  [facility] called to admit.  Patient reports 1 week history of confusion and forgetfulness.  States she felt dizzy 3 days ago and had 1 fall at home but did not injure her head or sustain any other injuries.  She reports chronic ongoing diarrhea for the past 1.5 years and states she was recently treated for C. difficile infection.  She is endorsing urinary urgency but denies dysuria.  She is endorsing bilateral lower quadrant abdominal pain.  Reports chronic nausea and is requesting a medication.  Denies any episodes of vomiting.  Denies fevers or chills.  Denies cough, shortness of breath, or chest pain.  She reports chronic back pain and is requesting a medication, takes oxycodone at home.  Review of Systems:  Review of Systems  All other systems reviewed and are negative.   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   Pernicious anemia 05/14/2021   Presence of IVC filter 03/19/2020   Pulmonary embolism, bilateral ([place]) 03/19/2020    Past Surgical History:  Procedure Laterality Date   ABDOMINAL HYSTERECTOMY     ABDOMINAL SURGERY     ACHILLES TENDON REPAIR     APPENDECTOMY     ARTHROPLASTY     CARPAL TUNNEL RELEASE  CHOLECYSTECTOMY     IR CATHETER TUBE CHANGE  02/02/2021   IR CATHETER TUBE CHANGE  02/25/2021   IR IMAGING GUIDED PORT INSERTION  06/20/2022   IR RADIOLOGIST EVAL & MGMT  02/24/2021   IR RADIOLOGIST EVAL & MGMT  03/10/2021   IR RADIOLOGIST  EVAL & MGMT  06/22/2022   IR THROMBECT VENO MECH MOD SED  06/20/2022   IR US GUIDE BX ASP/DRAIN  11/25/2019   IR US GUIDE VASC ACCESS LEFT  06/20/2022   IR US GUIDE VASC ACCESS RIGHT  06/20/2022   IR US GUIDE VASC ACCESS RIGHT  06/20/2022   IR VENO/EXT/BI  06/20/2022   IR VENOCAVAGRAM IVC  06/20/2022   JOINT REPLACEMENT     KNEE ARTHROSCOPY     ORIF ANKLE FRACTURE Right 08/27/2019   Procedure: OPEN REDUCTION INTERNAL FIXATION RIGHT ANKLE FRACTURE;  Surgeon: Renette Butters, MD;  Location: [facility];  Service: Orthopedics;  Laterality: Right;   perineorrophy     TONGUE BIOPSY       reports that she has never smoked. She has never used smokeless tobacco. She reports that she does not drink alcohol and does not use drugs.  Allergies  Allergen Reactions   Penicillins Shortness Of Breath    Other reaction(s): Irregular Heart Rate, Other (See Comments) Rapid heartrate    Alprazolam Hives and Other (See Comments)    Hard to arouse, unresponsiveness   Ativan [Lorazepam] Other (See Comments)    Note: tolerates midazolam fine Face & Throat Swelling.   Corticosteroids Other (See Comments)    Other reaction(s): Other (see comments) Psychotic behaviour     Erythromycin     Other reaction(s): Other (See Comments) Severe stomach pain    Prednisone Other (See Comments)    Anxiety & Nervous Breakdown.   Savella  [Milnacipran]    Prednisolone Anxiety    Family History  Problem Relation Age of Onset   Depression Mother    Drug abuse Sister    Suicidality Sister    Alcohol abuse Brother    Drug abuse Brother     Prior to Admission medications   Medication Sig Start Date End Date Taking? Authorizing Provider  alum & mag hydroxide-simeth (MAALOX/MYLANTA) 200-200-20 MG/5ML suspension Take 15 mLs by mouth every 6 (six) hours as needed for indigestion or heartburn. Patient not taking: Reported on 07/22/2022 05/08/22   Aline August, MD  buPROPion (WELLBUTRIN XL) 150 MG 24 hr tablet TAKE 1 TABLET  DAILY 07/11/22   Elwanda Brooklyn, NP  busPIRone (BUSPAR) 15 MG tablet Take 1 tablet (15 mg total) by mouth 3 (three) times daily. 04/05/22   Elwanda Brooklyn, NP  citalopram (CELEXA) 40 MG tablet TAKE 1 TABLET DAILY 07/11/22   Elwanda Brooklyn, NP  diazepam (VALIUM) 5 MG tablet Take 1 tablet 30-40 minutes prior to MRI Patient not taking: Reported on 07/04/2022 06/13/22   Pieter Partridge, DO  dicyclomine (BENTYL) 20 MG tablet Take 1 tablet (20 mg total) by mouth in the morning, at noon, in the evening, and at bedtime. 05/08/22   Aline August, MD  diphenoxylate-atropine (LOMOTIL) 2.5-0.025 MG tablet TAKE 2 TABLETS IN THE MORNING, AT NOON, IN THE EVENING AND AT BEDTIME Patient taking differently: Take 2 tablets by mouth 4 (four) times daily. 04/22/22   Volanda Napoleon, MD  enoxaparin (LOVENOX) 100 MG/ML injection Inject 1 mL (100 mg total) into the skin 2 (two) times daily. Patient taking differently: Inject 100 mg into the skin daily.  Has been taking daily since 06/30/22. 06/24/22   Eugenie Filler, MD  eszopiclone (LUNESTA) 2 MG TABS tablet TAKE 1 TABLET(2 MG) BY MOUTH AT BEDTIME AS NEEDED FOR SLEEP Patient taking differently: Take 2 mg by mouth at bedtime. 06/07/22   Elwanda Brooklyn, NP  famotidine (PEPCID) 20 MG tablet TAKE 2 TABLETS TWICE A DAY Patient taking differently: Take 40 mg by mouth 2 (two) times daily. 03/01/22   Volanda Napoleon, MD  ferrous sulfate 325 (65 FE) MG tablet Take 1 tablet (325 mg total) by mouth daily. Patient not taking: Reported on 07/07/2022 06/03/22 06/03/23  Heath Lark D, DO  fidaxomicin (DIFICID) 200 MG TABS tablet Take 1 tablet (200 mg total) by mouth 2 (two) times daily. Patient not taking: Reported on 07/22/2022 07/06/22   Volanda Napoleon, MD  folic acid (FOLVITE) 1 MG tablet Take 2 tablets (2 mg total) by mouth daily. Patient not taking: Reported on 07/07/2022 05/09/22   Aline August, MD  hydrocortisone (ANUSOL-HC) 2.5 % rectal cream Place rectally 4 (four) times daily as needed  for hemorrhoids or anal itching. Use 4 times daily x5 days, then 4 times daily as needed. 06/26/22   Eugenie Filler, MD  loperamide (IMODIUM) 2 MG capsule Take 2 capsules (4 mg total) by mouth 4 (four) times daily as needed for diarrhea or loose stools. 05/08/22   Aline August, MD  meclizine (ANTIVERT) 25 MG tablet TAKE 1 TABLET(25 MG) BY MOUTH THREE TIMES DAILY AS NEEDED FOR DIZZINESS 07/19/22   [name], MD  ondansetron (ZOFRAN) 8 MG tablet Take 8 mg by mouth every 8 (eight) hours as needed for nausea. 03/03/21   [provider]  orphenadrine (NORFLEX) 100 MG tablet TAKE 1 TABLET(100 MG) BY MOUTH AT BEDTIME AS NEEDED FOR MUSCLE SPASMS 06/06/22   Volanda Napoleon, MD  oxyCODONE (ROXICODONE) 5 MG immediate release tablet Take 1 tablet (5 mg total) by mouth every 4 (four) hours as needed for severe pain or breakthrough pain. 06/04/22   Manuella Ghazi, Pratik D, DO  pantoprazole (PROTONIX) 40 MG tablet Take 40 mg by mouth every morning. 01/01/21   [provider]  potassium chloride (KLOR-CON M) 10 MEQ tablet Take 2 tablets (20 mEq total) by mouth daily. 07/07/22   [name], MD  pramoxine-hydrocortisone (PROCTOCREAM-HC) 1-1 % rectal cream Place 1 Application rectally 2 (two) times daily. 07/04/22   Volanda Napoleon, MD  promethazine (PHENERGAN) 12.5 MG tablet Take 1 tablet (12.5 mg total) by mouth 2 (two) times daily as needed. Patient taking differently: Take 12.5 mg by mouth 2 (two) times daily as needed for nausea. 04/21/22   [name], MD  propranolol (INDERAL) 20 MG tablet Take 1 tablet (20 mg total) by mouth 2 (two) times daily. 04/21/22   [name], MD  traMADol (ULTRAM) 50 MG tablet Take 1 tablet (50 mg total) by mouth every 6 (six) hours as needed for moderate pain. Patient not taking: Reported on 07/04/2022 05/08/22   Aline August, MD    Physical Exam: Vitals:   07/22/22 1804 07/22/22 2315 07/22/22 2323 07/23/22 0102  BP: (!) 180/119 (!) 194/105  (!)  183/98  Pulse: 71 64  70  Resp: '18 17  18  '$ Temp: 99 F (37.2 C)  98.7 F (37.1 C)   TempSrc: Oral  Oral   SpO2: 95% 96%  97%    Physical Exam Vitals reviewed.  Constitutional:      General: She is  not in acute distress. HENT:     Head: Normocephalic and atraumatic.  Eyes:     Extraocular Movements: Extraocular movements intact.  Cardiovascular:     Rate and Rhythm: Normal rate and regular rhythm.     Pulses: Normal pulses.  Pulmonary:     Effort: Pulmonary effort is normal. No respiratory distress.     Breath sounds: Normal breath sounds.  Abdominal:     General: Bowel sounds are normal. There is no distension.     Palpations: Abdomen is soft.     Tenderness: There is no abdominal tenderness.  Musculoskeletal:        General: No swelling or tenderness.     Cervical back: Normal range of motion.  Skin:    General: Skin is warm and dry.  Neurological:     General: No focal deficit present.     Mental Status: She is alert and oriented to person, place, and time.     Cranial Nerves: No cranial nerve deficit.     Sensory: No sensory deficit.     Motor: No weakness.    Labs on Admission: I have personally reviewed following labs and imaging studies  CBC: Recent Labs  Lab 07/22/22 1834  WBC 7.3  NEUTROABS 3.7  HGB 13.4  HCT 41.9  MCV 96.1  PLT 063   Basic Metabolic Panel: Recent Labs  Lab 07/22/22 1834  NA 140  K 3.5  CL 105  CO2 26  GLUCOSE 140*  BUN 11  CREATININE 1.09*  CALCIUM 9.3   GFR: Estimated Creatinine Clearance: 66.8 mL/min (A) (by C-G formula based on SCr of 1.09 mg/dL (H)). Liver Function Tests: Recent Labs  Lab 07/22/22 1834  AST 41  ALT 33  ALKPHOS 186*  BILITOT 0.6  PROT 7.2  ALBUMIN 3.7   No results for input(s): "LIPASE", "AMYLASE" in the last 168 hours. Recent Labs  Lab 07/22/22 2028  AMMONIA 44*   Coagulation Profile: Recent Labs  Lab 07/22/22 1834  INR 1.0   Cardiac Enzymes: No results for input(s): "CKTOTAL",  "CKMB", "CKMBINDEX", "TROPONINI" in the last 168 hours. BNP (last 3 results) No results for input(s): "PROBNP" in the last 8760 hours. HbA1C: No results for input(s): "HGBA1C" in the last 72 hours. CBG: No results for input(s): "GLUCAP" in the last 168 hours. Lipid Profile: No results for input(s): "CHOL", "HDL", "LDLCALC", "TRIG", "CHOLHDL", "LDLDIRECT" in the last 72 hours. Thyroid Function Tests: No results for input(s): "TSH", "T4TOTAL", "FREET4", "T3FREE", "THYROIDAB" in the last 72 hours. Anemia Panel: No results for input(s): "VITAMINB12", "FOLATE", "FERRITIN", "TIBC", "IRON", "RETICCTPCT" in the last 72 hours. Urine analysis:    Component Value Date/Time   COLORURINE AMBER (A) 07/22/2022 1823   APPEARANCEUR CLOUDY (A) 07/22/2022 1823   LABSPEC 1.026 07/22/2022 1823   PHURINE 5.0 07/22/2022 1823   GLUCOSEU NEGATIVE 07/22/2022 1823   GLUCOSEU NEGATIVE 11/29/2021 1517   HGBUR NEGATIVE 07/22/2022 1823   BILIRUBINUR SMALL (A) 07/22/2022 1823   BILIRUBINUR large (A) 07/22/2022 0926   KETONESUR NEGATIVE 07/22/2022 1823   PROTEINUR 30 (A) 07/22/2022 1823   UROBILINOGEN negative (A) 07/22/2022 0926   UROBILINOGEN 0.2 11/29/2021 1517   NITRITE NEGATIVE 07/22/2022 1823   LEUKOCYTESUR SMALL (A) 07/22/2022 1823    Radiological Exams on Admission: CT Renal Stone Study  Result Date: 07/23/2022 CLINICAL DATA:  Flank pain. EXAM: CT ABDOMEN AND PELVIS WITHOUT CONTRAST TECHNIQUE: Multidetector CT imaging of the abdomen and pelvis was performed following the standard protocol without IV contrast.  RADIATION DOSE REDUCTION: This exam was performed according to the departmental dose-optimization program which includes automated exposure control, adjustment of the mA and/or kV according to patient size and/or use of iterative reconstruction technique. COMPARISON:  June 15, 2022 FINDINGS: Lower chest: No acute abnormality. Hepatobiliary: No focal liver abnormality is seen. Status post  cholecystectomy. No biliary dilatation. Pancreas: Diffuse atrophy of the pancreatic parenchyma is noted. There is no evidence of surrounding inflammatory fat stranding or pancreatic ductal dilatation. Spleen: The spleen is absent. Adrenals/Urinary Tract: Adrenal glands are unremarkable. Kidneys are normal in size, without focal lesions. Contrast is seen within the bilateral renal collecting systems. Contrast is seen throughout the lumen of a poorly distended urinary bladder. Stomach/Bowel: Stomach is within normal limits. Multiple surgical clips are seen within the region posterior to the body of the stomach. Surgically anastomosed bowel is seen within the anterior aspect of the mid abdomen, along the midline of the anterior aspect of the lower abdomen and within the lateral aspect of the right hemipelvis. There is no evidence of bowel dilatation. Mild to moderate severity small bowel wall thickening is seen within the anterior aspect of the mid and lower abdomen. Vascular/Lymphatic: No significant vascular findings are present. An inferior vena cava filter is noted. Multiple prongs from the IVC filter appear to extend outside of the lumen of the inferior vena cava. This is a chronic finding. No enlarged abdominal or pelvic lymph nodes. Reproductive: Status post hysterectomy. No adnexal masses. Other: A surgical scar seen along the midline of the anterior abdominal and pelvic walls. No abdominopelvic ascites. Musculoskeletal: Postoperative changes are seen within the lower lumbar spine at the levels of L4 and L5. IMPRESSION: 1. Surgically anastomosed bowel seen throughout the abdomen with findings consistent with enteritis involving multiple small bowel loops within the mid and lower abdomen. 2. Evidence of prior cholecystectomy, splenectomy and hysterectomy. 3. Postoperative changes within the lower lumbar spine. Electronically Signed   By: Virgina Norfolk M.D.   On: 07/23/2022 01:12   CT Angio Chest PE W  and/or Wo Contrast  Result Date: 07/22/2022 CLINICAL DATA:  Increasing confusion, shortness of breath, positive D-dimer EXAM: CT ANGIOGRAPHY CHEST WITH CONTRAST TECHNIQUE: Multidetector CT imaging of the chest was performed using the standard protocol during bolus administration of intravenous contrast. Multiplanar CT image reconstructions and MIPs were obtained to evaluate the vascular anatomy. RADIATION DOSE REDUCTION: This exam was performed according to the departmental dose-optimization program which includes automated exposure control, adjustment of the mA and/or kV according to patient size and/or use of iterative reconstruction technique. CONTRAST:  17m OMNIPAQUE IOHEXOL 350 MG/ML SOLN COMPARISON:  09/29/2021 FINDINGS: Cardiovascular: This is a technically adequate evaluation of the pulmonary vasculature. No filling defects or pulmonary emboli. The heart is stable without pericardial effusion. No evidence of thoracic aortic aneurysm or dissection. Left chest wall port tip within the superior vena cava at the atriocaval junction. Mediastinum/Nodes: No enlarged mediastinal, hilar, or axillary lymph nodes. Thyroid gland, trachea, and esophagus demonstrate no significant findings. Lungs/Pleura: No acute airspace disease, effusion, or pneumothorax. Scattered hypoventilatory changes at the lung bases. Central airways are patent. Upper Abdomen: No acute abnormality.  Stable cirrhosis. Musculoskeletal: No acute or destructive bony lesions. Bilateral shoulder arthroplasties. Reconstructed images demonstrate no additional findings. Review of the MIP images confirms the above findings. IMPRESSION: 1. No evidence of pulmonary embolus. 2. No acute intrathoracic process. Electronically Signed   By: MRanda NgoM.D.   On: 07/22/2022 22:16   CT Head Wo Contrast  Result Date: 07/22/2022 CLINICAL DATA:  History of multiple falls. EXAM: CT HEAD WITHOUT CONTRAST TECHNIQUE: Contiguous axial images were obtained from  the base of the skull through the vertex without intravenous contrast. RADIATION DOSE REDUCTION: This exam was performed according to the departmental dose-optimization program which includes automated exposure control, adjustment of the mA and/or kV according to patient size and/or use of iterative reconstruction technique. COMPARISON:  June 01, 2022 FINDINGS: Brain: No evidence of acute infarction, hemorrhage, hydrocephalus or extra-axial collection. A stable, approximately 1.5 cm diameter partially calcified mass is seen within the left posterior fossa along the tentorium on the left. Vascular: No hyperdense vessel or unexpected calcification. Skull: Normal. Negative for fracture or focal lesion. Sinuses/Orbits: There is mild right ethmoid sinus and left-sided sphenoid sinus mucosal thickening. Other: None. IMPRESSION: 1. Findings consistent with the patient's calcified left posterior fossa meningioma. 2. No acute intracranial abnormality. Electronically Signed   By: Virgina Norfolk M.D.   On: 07/22/2022 22:15    EKG: Independently reviewed.  Sinus rhythm, no acute changes.  Assessment and Plan  Confusion Patient reports 1 week history of confusion and forgetfulness.  At present, she is answer questions appropriately and does not appear confused.  Ammonia mildly elevated but no significant change from baseline.  She is endorsing urinary urgency but no dysuria.  UA not strongly suggestive of infection.  Will avoid starting antibiotic at this time given history of recent C. difficile infection.  CT head negative for acute finding and no focal neurodeficit on exam.  No fever or meningeal signs. ?Hypertensive encephalopathy as blood pressure is elevated. -Brain MRI -Check TSH and B12 -UDS -Add on urine culture  Enteritis Patient has chronic diarrhea and reports recent C. difficile infection.  No fever or leukocytosis.  CT showing findings consistent with enteritis involving multiple small bowel loops  within the mid and lower abdomen. -C. difficile PCR, GI pathogen panel -Enteric precautions  Uncontrolled hypertension Blood pressure elevated with systolic in the 937D to 428J. -Pharmacy med rec pending -IV hydralazine prn SBP >160  Fall at home -PT/OT eval, fall precautions  Recent extensive bilateral lower extremity DVTs CTA chest negative for PE. -Continue anticoagulation after pharmacy med rec is done.  Metastatic appendiceal cancer -Outpatient oncology follow-up  CAD Troponin negative x2 and not consistent with ACS.  Type 2 diabetes A1c 6.5 on 06/01/2022. -Sensitive sliding scale insulin ACHS  DVT prophylaxis: Continue anticoagulation after pharmacy med rec is done. Code Status: Full Code (discussed with the patient) Family Communication: No family available at this time. Level of care: Telemetry bed Admission status: It is my clinical opinion that referral for OBSERVATION is reasonable and necessary in this patient based on the above information provided. The aforementioned taken together are felt to place the patient at high risk for further clinical deterioration. However, it is anticipated that the patient may be medically stable for discharge from the hospital within 24 to 48 hours.   Shela Leff MD [facility]  If 7PM-7AM, please contact night-coverage [email or web address]  07/23/2022, 1:37 AM

## 2022-07-23 NOTE — ED Notes (Signed)
Rathore MD paged regarding pts BP 203/115 following hydralazine. Pt also requesting nausea meds

## 2022-07-23 NOTE — ED Notes (Signed)
Pt ambulated to bathroom with assistance by this RN 

## 2022-07-23 NOTE — Progress Notes (Signed)
Pt arrived from ..ED..., A/ox .4..pt denies any pain, MD aware,CCMD called. CHG bath given,no further needs at this time   

## 2022-07-23 NOTE — Progress Notes (Signed)
PROGRESS NOTE    Wendy Barber  KDX:[id number] DOB: 1960-12-31 DOA: 07/22/2022 PCP: [name], MD   Brief Narrative:  Wendy Barber is a 61 y.o. 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  Recently admitted 8/21-9/3 after multiple syncopal episodes at home and work-up revealed extensive DVT involving bilateral lower extremities and inferior vena cava.  Underwent mechanical thrombolysis.  IR had initially planned to remove IVC filter, however, procedure canceled as residual clot noted in the femoral vein.  Discharged on full dose Lovenox.  She was also treated for UTI and AKI.  She received 5 units PRBCs during this hospitalization for blood loss anemia.   She presents to the ED today for evaluation of confusion and multiple falls.  Hypertensive on arrival to the ED with blood pressure 180/119, remainder of vital signs stable.  Labs showing no leukocytosis, hemoglobin 13.4 (improved compared to prior labs), glucose 140, creatinine 1.0 (stable), alkaline phosphatase 186 with normal remainder of LFTs, troponin negative x2, ammonia 44, COVID test pending.  UA showing negative nitrite, small amount of leukocytes, and microscopy showing 11-20 WBCs and rare bacteria.  CT head showing findings consistent with the patient's calcified left posterior fossa meningioma and no acute intracranial abnormality.  CTA chest negative for PE or any other acute intrathoracic process.  CT renal stone study showing findings consistent with enteritis involving multiple small bowel loops within the mid and lower abdomen. No medications  administered in the ED.  TRH called to admit.  Assessment & Plan:   Acute metabolic encephalopathy: -Unknown etiology.  CT and MRI brain negative for any acute findings -UTI?  Hypertensive encephalopathy?  Dehydration?  Not started on antibiotic given history of recent C. difficile infection. -UDS negative, TSH, B12: WNL, ammonia mildly elevated however at baseline -Clinically she is improving.  Urine culture pending.  She is afebrile with no leukocytosis. -PT/OT evaluation -Fall precautions  Enteritis: Chron diarrhea: History of recent C. difficile on 9/12: -She is afebrile with no leukocytosis.  CT abdomen/pelvis showing findings consistent with enteritis involving multiple small bowel loops within the mid and lower abdomen.  C. difficile PCR and GI pathogen panel pending.  On enteric precautions.  Uncontrolled hypertension: -Resume home propranolol.  Continue hydralazine IV as needed.  Continue to monitor blood pressure closely.  Recurrent falls: -PT/OT evaluation.  On fall precautions  Extensive bilateral lower extremity DVT: History of PE -CTA chest negative for PE.  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 [name]   AKI: Likely dehydration.  Repeat BMP tomorrow a.m.  Coronary artery disease: Aortic atherosclerosis: Troponin x2 negative.  Patient denies any ACS symptoms.  Continue to monitor  Well-controlled type 2 diabetes: Last A1c 6.5% checked on 06/01/2022.  Continue sliding scale insulin. -Monitor blood sugar closely.  Iron deficiency anemia due to chronic blood loss: Currently stable.  Continue ferrous sulfate  Depression with anxiety: Continue home medication bupropion, BuSpar, citalopram  Obesity with BMI of 36: Diet modification, exercise and weight loss recommended.  History of essential  tremors: Continue propanolol  Meningioma: -Reviewed CT head and MRI brain:  Stable findings.  DVT prophylaxis: Lovenox Code Status: Full code Family Communication:  None present at bedside.  Plan of care discussed with patient in length and she verbalized understanding and agreed with it. Disposition Plan: To be determined  Consultants:  None none Procedures:  None  Antimicrobials:  None  Status is: Observation   Subjective: Patient seen and examined.  Reports that she feels awful.  She is not sure why she is confused and forgetful.  Tells me that so many things going on with her.  She has chronic diarrhea and also known history of meningioma.  Denies fever, chills, abdominal pain, bleeding per rectum.  Objective: Vitals:   07/23/22 0645 07/23/22 0821 07/23/22 0830 07/23/22 0914  BP: (!) 168/107 (!) 185/110 (!) 175/88 (!) 169/96  Pulse: 83 80 79 85  Resp: (!) '21 20 14 18  '$ Temp:  97.8 F (36.6 C)  98.2 F (36.8 C)  TempSrc:  Oral  Oral  SpO2: 94% 98% 95% 98%   No intake or output data in the 24 hours ending 07/23/22 1120 There were no vitals filed for this visit.  Examination:  General exam: Appears calm and comfortable, on room air, communicating well, appears weak and sick  respiratory system: Clear to auscultation. Respiratory effort normal. Cardiovascular system: S1 & S2 heard, RRR. No JVD, murmurs, rubs, gallops or clicks. No pedal edema. Gastrointestinal system: Abdomen is nondistended, soft and nontender. No organomegaly or masses felt. Normal bowel sounds heard. Central nervous system: Alert and oriented. No focal neurological deficits. Extremities: Symmetric 5 x 5 power. Skin: No rashes, lesions or ulcers Psychiatry: Judgement and insight appear normal. Mood & affect appropriate.    Data Reviewed: I have personally reviewed following labs and imaging studies  CBC: Recent Labs  Lab 07/22/22 1834  WBC 7.3  NEUTROABS 3.7  HGB 13.4  HCT 41.9  MCV 96.1   PLT 381   Basic Metabolic Panel: Recent Labs  Lab 07/22/22 1834  NA 140  K 3.5  CL 105  CO2 26  GLUCOSE 140*  BUN 11  CREATININE 1.09*  CALCIUM 9.3   GFR: Estimated Creatinine Clearance: 66.8 mL/min (A) (by C-G formula based on SCr of 1.09 mg/dL (H)). Liver Function Tests: Recent Labs  Lab 07/22/22 1834  AST 41  ALT 33  ALKPHOS 186*  BILITOT 0.6  PROT 7.2  ALBUMIN 3.7   No results for input(s): "LIPASE", "AMYLASE" in the last 168 hours. Recent Labs  Lab 07/22/22 2028  AMMONIA 44*   Coagulation Profile: Recent Labs  Lab 07/22/22 1834  INR 1.0   Cardiac Enzymes: No results for input(s): "CKTOTAL", "CKMB", "CKMBINDEX", "TROPONINI" in the last 168 hours. BNP (last 3 results) No results for input(s): "PROBNP" in the last 8760 hours. HbA1C: No results for input(s): "HGBA1C" in the last 72 hours. CBG: Recent Labs  Lab 07/23/22 0249 07/23/22 0911  GLUCAP 114* 149*   Lipid Profile: No results for input(s): "CHOL", "HDL", "LDLCALC", "TRIG", "CHOLHDL", "LDLDIRECT" in the last 72 hours. Thyroid Function Tests: Recent Labs    07/23/22 0624  TSH 3.789   Anemia Panel: Recent Labs    07/23/22 0624  VITAMINB12 191   Sepsis Labs: No results for input(s): "PROCALCITON", "LATICACIDVEN" in the last 168 hours.  Recent Results (from the past 240 hour(s))  SARS Coronavirus 2 by RT PCR (hospital order, performed in [facility] hospital lab) *cepheid single result test* Anterior Nasal Swab     Status: None   Collection Time: 07/23/22  1:02 AM   Specimen: Anterior Nasal Swab  Result Value Ref Range Status   SARS Coronavirus 2 by RT PCR NEGATIVE NEGATIVE Final    Comment: (NOTE) SARS-CoV-2 target nucleic acids are NOT DETECTED.  The SARS-CoV-2 RNA is generally detectable in upper and lower respiratory specimens during the acute phase of infection. The lowest concentration of SARS-CoV-2 viral copies this assay can detect is 250 copies / mL. A negative result  does not preclude SARS-CoV-2 infection and should not be used as the sole basis for treatment or other patient management decisions.  A negative result may occur with improper specimen collection / handling, submission of specimen other than nasopharyngeal swab, presence of viral mutation(s) within the areas targeted by this assay, and inadequate number of viral copies (<250 copies / mL). A negative result must be combined with clinical observations, patient history, and epidemiological information.  Fact Sheet for Patients:   https://www.patel.info/  Fact Sheet for Healthcare Providers: https://hall.com/  This test is not yet approved or  cleared by the [place] FDA and has been authorized for detection and/or diagnosis of SARS-CoV-2 by FDA under an Emergency Use Authorization (EUA).  This EUA will remain in effect (meaning this test can be used) for the duration of the COVID-19 declaration under Section 564(b)(1) of the Act, 21 U.S.C. section 360bbb-3(b)(1), unless the authorization is terminated or revoked sooner.  Performed at [facility], [place] [address] [place], [place] [address]       Radiology Studies: MR BRAIN WO CONTRAST  Result Date: 07/23/2022 CLINICAL DATA:  Delirium EXAM: MRI HEAD WITHOUT CONTRAST TECHNIQUE: Multiplanar, multiecho pulse sequences of the brain and surrounding structures were obtained without intravenous contrast. COMPARISON:  Head CT from yesterday FINDINGS: Brain: No acute infarction, hemorrhage, hydrocephalus, extra-axial collection or mass effect. 19 mm meningioma along the left para median posterior tentorium. No adjacent cortical edema. Few remote white matter insults Vascular: No change in flow voids. The meningioma narrows the distal straight sinus. Skull and upper cervical spine: Normal marrow signal. Sinuses/Orbits: Partial opacification of mastoid air cells with normal nasopharynx.  IMPRESSION: 1. No emergent finding. 2. Known meningioma along the left tentorium measuring 19 mm. Electronically Signed   By: Jorje Guild M.D.   On: 07/23/2022 11:11   CT Renal Stone Study  Result Date: 07/23/2022 CLINICAL DATA:  Flank pain. EXAM: CT ABDOMEN AND PELVIS WITHOUT CONTRAST TECHNIQUE: Multidetector CT imaging of the abdomen and pelvis was performed following the standard protocol without IV contrast. RADIATION DOSE REDUCTION: This exam was performed according to the departmental dose-optimization program which includes automated exposure control, adjustment of the mA and/or kV according to patient size and/or use of iterative reconstruction technique. COMPARISON:  June 15, 2022 FINDINGS: Lower chest: No acute abnormality. Hepatobiliary: No focal liver abnormality is seen. Status post cholecystectomy. No biliary dilatation. Pancreas: Diffuse atrophy of the pancreatic parenchyma is noted. There is no evidence of surrounding inflammatory fat stranding or pancreatic ductal dilatation. Spleen: The spleen is absent. Adrenals/Urinary Tract: Adrenal glands are unremarkable. Kidneys are normal in size, without focal lesions. Contrast is seen within the bilateral renal collecting systems. Contrast is seen throughout the lumen of a poorly distended urinary bladder. Stomach/Bowel: Stomach is within normal limits. Multiple surgical clips are seen within the region posterior to the body of the stomach. Surgically anastomosed bowel is seen within the anterior aspect of the mid abdomen, along the midline of the anterior aspect of the lower abdomen and within the lateral aspect  of the right hemipelvis. There is no evidence of bowel dilatation. Mild to moderate severity small bowel wall thickening is seen within the anterior aspect of the mid and lower abdomen. Vascular/Lymphatic: No significant vascular findings are present. An inferior vena cava filter is noted. Multiple prongs from the IVC filter appear to  extend outside of the lumen of the inferior vena cava. This is a chronic finding. No enlarged abdominal or pelvic lymph nodes. Reproductive: Status post hysterectomy. No adnexal masses. Other: A surgical scar seen along the midline of the anterior abdominal and pelvic walls. No abdominopelvic ascites. Musculoskeletal: Postoperative changes are seen within the lower lumbar spine at the levels of L4 and L5. IMPRESSION: 1. Surgically anastomosed bowel seen throughout the abdomen with findings consistent with enteritis involving multiple small bowel loops within the mid and lower abdomen. 2. Evidence of prior cholecystectomy, splenectomy and hysterectomy. 3. Postoperative changes within the lower lumbar spine. Electronically Signed   By: Virgina Norfolk M.D.   On: 07/23/2022 01:12   CT Angio Chest PE W and/or Wo Contrast  Result Date: 07/22/2022 CLINICAL DATA:  Increasing confusion, shortness of breath, positive D-dimer EXAM: CT ANGIOGRAPHY CHEST WITH CONTRAST TECHNIQUE: Multidetector CT imaging of the chest was performed using the standard protocol during bolus administration of intravenous contrast. Multiplanar CT image reconstructions and MIPs were obtained to evaluate the vascular anatomy. RADIATION DOSE REDUCTION: This exam was performed according to the departmental dose-optimization program which includes automated exposure control, adjustment of the mA and/or kV according to patient size and/or use of iterative reconstruction technique. CONTRAST:  71m OMNIPAQUE IOHEXOL 350 MG/ML SOLN COMPARISON:  09/29/2021 FINDINGS: Cardiovascular: This is a technically adequate evaluation of the pulmonary vasculature. No filling defects or pulmonary emboli. The heart is stable without pericardial effusion. No evidence of thoracic aortic aneurysm or dissection. Left chest wall port tip within the superior vena cava at the atriocaval junction. Mediastinum/Nodes: No enlarged mediastinal, hilar, or axillary lymph nodes.  Thyroid gland, trachea, and esophagus demonstrate no significant findings. Lungs/Pleura: No acute airspace disease, effusion, or pneumothorax. Scattered hypoventilatory changes at the lung bases. Central airways are patent. Upper Abdomen: No acute abnormality.  Stable cirrhosis. Musculoskeletal: No acute or destructive bony lesions. Bilateral shoulder arthroplasties. Reconstructed images demonstrate no additional findings. Review of the MIP images confirms the above findings. IMPRESSION: 1. No evidence of pulmonary embolus. 2. No acute intrathoracic process. Electronically Signed   By: MRanda NgoM.D.   On: 07/22/2022 22:16   CT Head Wo Contrast  Result Date: 07/22/2022 CLINICAL DATA:  History of multiple falls. EXAM: CT HEAD WITHOUT CONTRAST TECHNIQUE: Contiguous axial images were obtained from the base of the skull through the vertex without intravenous contrast. RADIATION DOSE REDUCTION: This exam was performed according to the departmental dose-optimization program which includes automated exposure control, adjustment of the mA and/or kV according to patient size and/or use of iterative reconstruction technique. COMPARISON:  June 01, 2022 FINDINGS: Brain: No evidence of acute infarction, hemorrhage, hydrocephalus or extra-axial collection. A stable, approximately 1.5 cm diameter partially calcified mass is seen within the left posterior fossa along the tentorium on the left. Vascular: No hyperdense vessel or unexpected calcification. Skull: Normal. Negative for fracture or focal lesion. Sinuses/Orbits: There is mild right ethmoid sinus and left-sided sphenoid sinus mucosal thickening. Other: None. IMPRESSION: 1. Findings consistent with the patient's calcified left posterior fossa meningioma. 2. No acute intracranial abnormality. Electronically Signed   By: TVirgina NorfolkM.D.   On: 07/22/2022 22:15  Scheduled Meds:  Chlorhexidine Gluconate Cloth  6 each Topical Daily   enoxaparin  100 mg  Subcutaneous BID   insulin aspart  0-5 Units Subcutaneous QHS   insulin aspart  0-9 Units Subcutaneous TID WC   lidocaine  1 patch Transdermal Q24H   sodium chloride flush  10-40 mL Intracatheter Q12H   Continuous Infusions:   LOS: 0 days   Time spent: 35 minutes   Donzel Romack Loann Quill, MD [facility]  If 7PM-7AM, please contact night-coverage [email or web address] 07/23/2022, 11:20 AM

## 2022-07-23 NOTE — Evaluation (Signed)
Physical Therapy Evaluation Patient Details Name: Wendy Barber MRN: [id number] DOB: 1961/07/23 Today's Date: 07/23/2022  History of Present Illness  Pt is a 61 y/o female admitted 9/29 to ED with AMS.   PMH includes DVT/PE (s/p IVC filter 2005), HTN, DM2, anemia, anxiety, autoimmune hepatitis, chronic fatigue, fibromyalgia, obesity, depression, CA of appendix metastatic.  Clinical Impression  Pt admitted with/for AMS, but weak also.  Pt only needing supervision as the worst, but is not at her baseline presently  Will stay on board in case she is admitted, otherwise no follow up needs..  Pt currently limited functionally due to the problems listed below.  (see problems list.)  Pt will benefit from PT to maximize function and safety to be able to get home safely with available assist.        Recommendations for follow up therapy are one component of a multi-disciplinary discharge planning process, led by the attending physician.  Recommendations may be updated based on patient status, additional functional criteria and insurance authorization.  Follow Up Recommendations No PT follow up      Assistance Recommended at Discharge Set up Supervision/Assistance  Patient can return home with the following  Help with stairs or ramp for entrance;Assist for transportation    Equipment Recommendations None recommended by PT  Recommendations for Other Services       Functional Status Assessment Patient has had a recent decline in their functional status and demonstrates the ability to make significant improvements in function in a reasonable and predictable amount of time.     Precautions / Restrictions Precautions Precautions: Fall      Mobility  Bed Mobility Overal bed mobility: Modified Independent                  Transfers Overall transfer level: Modified independent                      Ambulation/Gait Ambulation/Gait assistance: Supervision Gait Distance  (Feet): 170 Feet Assistive device: None Gait Pattern/deviations: Step-through pattern, Decreased stride length   Gait velocity interpretation: <1.8 ft/sec, indicate of risk for recurrent falls   General Gait Details: steady, but guarded and slow cadence, pt related progressing weakness within session.  Stairs            Wheelchair Mobility    Modified Rankin (Stroke Patients Only)       Balance Overall balance assessment: Modified Independent, Needs assistance   Sitting balance-Leahy Scale: Normal       Standing balance-Leahy Scale: Good Standing balance comment: She has periods of lightheadedness/dizziness/spinning, but not today.                             Pertinent Vitals/Pain Pain Assessment Pain Assessment: Faces Faces Pain Scale: Hurts even more Pain Location: headache Pain Descriptors / Indicators: Aching Pain Intervention(s): Patient requesting pain meds-RN notified, Monitored during session    Home Living Family/patient expects to be discharged to:: Private residence Living Arrangements: Spouse/significant other Available Help at Discharge: Family;Available 24 hours/day Type of Home: House Home Access: Stairs to enter Entrance Stairs-Rails: Right Entrance Stairs-Number of Steps: 6   Home Layout: One level Home Equipment: Conservation officer, nature (2 wheels);Rollator (4 wheels);Shower seat;Cane - single point;BSC/3in1      Prior Function Prior Level of Function : Independent/Modified Independent             Mobility Comments: Ambulating limited community distances, typically without DME but  use of rollator PRN.  Doesn't get out alot       Hand Dominance   Dominant Hand: Right    Extremity/Trunk Assessment   Upper Extremity Assessment Upper Extremity Assessment: Overall WFL for tasks assessed;Generalized weakness    Lower Extremity Assessment Lower Extremity Assessment: Overall WFL for tasks assessed;Generalized weakness     Cervical / Trunk Assessment Cervical / Trunk Assessment: Normal  Communication   Communication: No difficulties  Cognition Arousal/Alertness: Awake/alert Behavior During Therapy: WFL for tasks assessed/performed Overall Cognitive Status: Within Functional Limits for tasks assessed (slowed, but functional)                                          General Comments General comments (skin integrity, edema, etc.): vss    Exercises     Assessment/Plan    PT Assessment Patient needs continued PT services  PT Problem List Decreased strength;Decreased activity tolerance;Decreased balance;Decreased mobility       PT Treatment Interventions Gait training;Stair training;Functional mobility training;Therapeutic activities;Patient/family education;Balance training    PT Goals (Current goals can be found in the Care Plan section)  Acute Rehab PT Goals Patient Stated Goal: home, but I need something for this HA PT Goal Formulation: With patient Time For Goal Achievement: 08/06/22 Potential to Achieve Goals: Good    Frequency Min 3X/week     Co-evaluation               AM-PAC PT "6 Clicks" Mobility  Outcome Measure Help needed turning from your back to your side while in a flat bed without using bedrails?: None Help needed moving from lying on your back to sitting on the side of a flat bed without using bedrails?: None Help needed moving to and from a bed to a chair (including a wheelchair)?: None Help needed standing up from a chair using your arms (e.g., wheelchair or bedside chair)?: None Help needed to walk in hospital room?: A Little Help needed climbing 3-5 steps with a railing? : A Little 6 Click Score: 22    End of Session   Activity Tolerance: Patient tolerated treatment well;Patient limited by fatigue Patient left: in bed;with call bell/phone within reach Nurse Communication: Mobility status PT Visit Diagnosis: Unsteadiness on feet  (R26.81);Muscle weakness (generalized) (M62.81)    Time: 1856-3149 PT Time Calculation (min) (ACUTE ONLY): 20 min   Charges:   PT Evaluation $PT Eval Moderate Complexity: 1 Mod          07/23/2022  Wendy Carne., PT Acute Rehabilitation Services [phone]  (office)  Wendy Barber 07/23/2022, 12:33 PM

## 2022-07-23 NOTE — Consult Note (Signed)
Wendy Barber is well-known to me.  She is a very nice 61 year old white female with a history of appendiceal carcinoma.  She has had multiple surgeries for this.  She underwent HIPEC surgery for the update [place] back in February 2022.  She never has gone over this.  She has had horrible diarrhea.  She has had C. difficile.  She is also had thromboembolic disease.  She [name] was hospitalized with a severe thrombus in the legs.  She underwent thrombectomy.  She is on Lovenox right now.  Again the diarrhea has been her biggest problem.  I think she recently had a stool test that was positive for C. difficile in September about 3 weeks ago.  She now is in the emergency room with history of confusion.  She seems to be relatively normal from my point in the.  She had a CT of the brain which was unremarkable.  Her blood pressures been on the high side.  She still has issues with the diarrhea.  She had a CT of the abdomen on 07/23/2022.  This seemed to show some enteritis.  CT angio of the chest and extremity pulmonary embolism.  Her labs when she came in showed a white count of 7.3.  Hemoglobin 13.4.  Platelet count 268,000.  Her vitamin B12 was 191.  Her calcium was 9.3.  Albumin 3.7.  BUN was 11 creatinine 1.09.  Her sodium is 140 potassium 3.5.  Again, she seems to be free of alert and oriented to me.  I think that when she came in, her blood pressure was incredibly high.  This is better but still in the high side.  Her main complaint has been the diarrhea.  She really would like to think about getting a colostomy.  She is tired of not being able to do anything because she has to be close to a bathroom because of the diarrhea.  Gastroenterology has not been able to help her out.  While she is here, she would like to see if surgery would consider her for a colostomy.  This really is make her life easier so she would not have to be living next to a bathroom all the time.  I would have said  that her performance status is probably ECOG 1.   Her vital signs show temperature of 98.2.  Pulse 85.  Blood pressure 169/96.  Oxygen saturation 98%.  Her head neck exam shows no ocular or oral lesions.  Pupils react appropriately.  There is no adenopathy in the neck.  Lungs are clear.  Cardiac exam regular rate and rhythm.  Abdomen is soft.  Bowel sounds are decreased.  There is no guarding or rebound tenderness.  There is no abdominal mass.  There is no fluid wave.  There is no palpable liver or spleen tip.  Extremities shows no clubbing, cyanosis or edema.  Skin exam does show some ecchymoses.  She has some lacerations on her right arm which appear to be from a new puppy that they have.  Neurological exam shows no focal deficits.  Again, there is nothing oncologic that needs to be done.  There is nothing hematologic that needs to be done from our point of view.  This is mostly gastroenterology.  Again, diarrhea continues to control her life.  She will continue on the Lovenox.  There is no evidence of bleed.  Not sure why she had the confusion.  This afternoon, she certainly appears to be a back to her baseline.  I do very much appreciate the incredible care that she is gotten from everybody in the ER.  [name], MD

## 2022-07-23 NOTE — ED Provider Notes (Signed)
[facility] EMERGENCY DEPARTMENT Provider Note   CSN: [id number] Arrival date & time: 07/22/22  1719     History  Chief Complaint  Patient presents with   Fall   Shortness of Breath    Wendy Barber is a 61 y.o. female.  The history is provided by the spouse.  Fall This is a new problem. Episode onset: multiple this week. The problem has been resolved. Pertinent negatives include no chest pain. Nothing aggravates the symptoms. Nothing relieves the symptoms. She has tried nothing for the symptoms.  Patient with a history of cancer, PE and autoimmune cirrhosis who presents with altered mental status, and multiple falls x 1 week.  Seen by PMD earlier in the day and directed to the ED for further care.  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 Authorizing Provider  alum & mag hydroxide-simeth (MAALOX/MYLANTA) 200-200-20 MG/5ML suspension Take 15 mLs by mouth every 6 (six) hours as needed for indigestion or heartburn. Patient not taking: Reported on 07/22/2022 05/08/22   Aline August, MD  buPROPion (WELLBUTRIN XL) 150 MG 24 hr tablet TAKE 1 TABLET DAILY 07/11/22   Elwanda Brooklyn, NP  busPIRone (BUSPAR) 15 MG tablet Take 1 tablet (15 mg total) by mouth 3 (three) times daily. 04/05/22    Elwanda Brooklyn, NP  citalopram (CELEXA) 40 MG tablet TAKE 1 TABLET DAILY 07/11/22   Elwanda Brooklyn, NP  diazepam (VALIUM) 5 MG tablet Take 1 tablet 30-40 minutes prior to MRI Patient not taking: Reported on 07/04/2022 06/13/22   Pieter Partridge, DO  dicyclomine (BENTYL) 20 MG tablet Take 1 tablet (20 mg total) by mouth in the morning, at noon, in the evening, and at bedtime. 05/08/22   Aline August, MD  diphenoxylate-atropine (LOMOTIL) 2.5-0.025 MG tablet TAKE 2 TABLETS IN THE MORNING, AT NOON, IN THE EVENING AND AT BEDTIME Patient taking differently: Take 2 tablets by mouth 4 (four) times daily. 04/22/22   Volanda Napoleon, MD  enoxaparin (LOVENOX) 100 MG/ML injection Inject 1 mL (100 mg total) into the skin 2 (two) times daily. Patient taking differently: Inject 100 mg into the skin daily. Has been taking daily since 06/30/22. 06/24/22   Eugenie Filler, MD  eszopiclone (LUNESTA) 2 MG TABS tablet TAKE 1 TABLET(2 MG) BY MOUTH AT BEDTIME AS NEEDED FOR SLEEP Patient taking differently: Take 2 mg by mouth at bedtime. 06/07/22   Elwanda Brooklyn, NP  famotidine (PEPCID) 20 MG tablet TAKE 2 TABLETS TWICE A DAY Patient taking differently: Take 40 mg by mouth 2 (two) times daily. 03/01/22   Volanda Napoleon, MD  ferrous sulfate 325 (65 FE) MG tablet Take 1 tablet (325 mg total) by mouth daily. Patient not taking: Reported on 07/07/2022 06/03/22  06/03/23  Manuella Ghazi, Pratik D, DO  fidaxomicin (DIFICID) 200 MG TABS tablet Take 1 tablet (200 mg total) by mouth 2 (two) times daily. Patient not taking: Reported on 07/22/2022 07/06/22   Volanda Napoleon, MD  folic acid (FOLVITE) 1 MG tablet Take 2 tablets (2 mg total) by mouth daily. Patient not taking: Reported on 07/07/2022 05/09/22   Aline August, MD  hydrocortisone (ANUSOL-HC) 2.5 % rectal cream Place rectally 4 (four) times daily as needed for hemorrhoids or anal itching. Use 4 times daily x5 days, then 4 times daily as needed. 06/26/22   Eugenie Filler, MD  loperamide  (IMODIUM) 2 MG capsule Take 2 capsules (4 mg total) by mouth 4 (four) times daily as needed for diarrhea or loose stools. 05/08/22   Aline August, MD  meclizine (ANTIVERT) 25 MG tablet TAKE 1 TABLET(25 MG) BY MOUTH THREE TIMES DAILY AS NEEDED FOR DIZZINESS 07/19/22   [name], MD  ondansetron (ZOFRAN) 8 MG tablet Take 8 mg by mouth every 8 (eight) hours as needed for nausea. 03/03/21   [provider]  orphenadrine (NORFLEX) 100 MG tablet TAKE 1 TABLET(100 MG) BY MOUTH AT BEDTIME AS NEEDED FOR MUSCLE SPASMS 06/06/22   Volanda Napoleon, MD  oxyCODONE (ROXICODONE) 5 MG immediate release tablet Take 1 tablet (5 mg total) by mouth every 4 (four) hours as needed for severe pain or breakthrough pain. 06/04/22   Manuella Ghazi, Pratik D, DO  pantoprazole (PROTONIX) 40 MG tablet Take 40 mg by mouth every morning. 01/01/21   [provider]  potassium chloride (KLOR-CON M) 10 MEQ tablet Take 2 tablets (20 mEq total) by mouth daily. 07/07/22   [name], MD  pramoxine-hydrocortisone (PROCTOCREAM-HC) 1-1 % rectal cream Place 1 Application rectally 2 (two) times daily. 07/04/22   Volanda Napoleon, MD  promethazine (PHENERGAN) 12.5 MG tablet Take 1 tablet (12.5 mg total) by mouth 2 (two) times daily as needed. Patient taking differently: Take 12.5 mg by mouth 2 (two) times daily as needed for nausea. 04/21/22   [name], MD  propranolol (INDERAL) 20 MG tablet Take 1 tablet (20 mg total) by mouth 2 (two) times daily. 04/21/22   [name], MD  traMADol (ULTRAM) 50 MG tablet Take 1 tablet (50 mg total) by mouth every 6 (six) hours as needed for moderate pain. Patient not taking: Reported on 07/04/2022 05/08/22   Aline August, MD      Allergies    Penicillins, Alprazolam, Ativan [lorazepam], Corticosteroids, Erythromycin, Prednisone, Savella  [milnacipran], and Prednisolone    Review of Systems   Review of Systems  Constitutional:  Negative for fever.  HENT:  Negative for  congestion.   Eyes:  Negative for redness.  Respiratory:  Negative for wheezing and stridor.   Cardiovascular:  Negative for chest pain.  Genitourinary:  Negative for dysuria.  Neurological:  Negative for facial asymmetry.  Psychiatric/Behavioral:  Positive for confusion.     Physical Exam Updated Vital Signs BP (!) 183/98   Pulse 70   Temp 98.7 F (37.1 C) (Oral)   Resp 18   SpO2 97%  Physical Exam Vitals and nursing note reviewed.  Constitutional:      General: She is not in acute distress.    Appearance: She is well-developed. She is not diaphoretic.  HENT:     Head: Normocephalic and atraumatic.     Nose: Nose normal.  Eyes:     Pupils: Pupils are equal, round, and reactive to  light.  Cardiovascular:     Rate and Rhythm: Normal rate and regular rhythm.     Pulses: Normal pulses.     Heart sounds: Normal heart sounds.  Pulmonary:     Effort: Pulmonary effort is normal. No respiratory distress.     Breath sounds: Normal breath sounds.  Abdominal:     General: Bowel sounds are normal. There is no distension.     Palpations: Abdomen is soft.     Tenderness: There is no abdominal tenderness. There is no guarding or rebound.  Genitourinary:    Vagina: No vaginal discharge.  Musculoskeletal:        General: Normal range of motion.     Cervical back: Neck supple.  Skin:    General: Skin is warm and dry.     Capillary Refill: Capillary refill takes less than 2 seconds.     Findings: No erythema or rash.  Neurological:     General: No focal deficit present.     Mental Status: She is alert.     Deep Tendon Reflexes: Reflexes normal.  Psychiatric:        Mood and Affect: Mood normal.     ED Results / Procedures / Treatments   Labs (all labs ordered are listed, but only abnormal results are displayed) Results for orders placed or performed during the hospital encounter of 07/22/22  Comprehensive metabolic panel  Result Value Ref Range   Sodium 140 135 - 145 mmol/L    Potassium 3.5 3.5 - 5.1 mmol/L   Chloride 105 98 - 111 mmol/L   CO2 26 22 - 32 mmol/L   Glucose, Bld 140 (H) 70 - 99 mg/dL   BUN 11 6 - 20 mg/dL   Creatinine, Ser 1.09 (H) 0.44 - 1.00 mg/dL   Calcium 9.3 8.9 - 10.3 mg/dL   Total Protein 7.2 6.5 - 8.1 g/dL   Albumin 3.7 3.5 - 5.0 g/dL   AST 41 15 - 41 U/L   ALT 33 0 - 44 U/L   Alkaline Phosphatase 186 (H) 38 - 126 U/L   Total Bilirubin 0.6 0.3 - 1.2 mg/dL   GFR, Estimated 59 (L) >60 mL/min   Anion gap 9 5 - 15  CBC with Differential/Platelet  Result Value Ref Range   WBC 7.3 4.0 - 10.5 K/uL   RBC 4.36 3.87 - 5.11 MIL/uL   Hemoglobin 13.4 12.0 - 15.0 g/dL   HCT 41.9 36.0 - 46.0 %   MCV 96.1 80.0 - 100.0 fL   MCH 30.7 26.0 - 34.0 pg   MCHC 32.0 30.0 - 36.0 g/dL   RDW 18.3 (H) 11.5 - 15.5 %   Platelets 268 150 - 400 K/uL   nRBC 0.0 0.0 - 0.2 %   Neutrophils Relative % 50 %   Neutro Abs 3.7 1.7 - 7.7 K/uL   Lymphocytes Relative 36 %   Lymphs Abs 2.6 0.7 - 4.0 K/uL   Monocytes Relative 10 %   Monocytes Absolute 0.7 0.1 - 1.0 K/uL   Eosinophils Relative 3 %   Eosinophils Absolute 0.2 0.0 - 0.5 K/uL   Basophils Relative 1 %   Basophils Absolute 0.1 0.0 - 0.1 K/uL   Immature Granulocytes 0 %   Abs Immature Granulocytes 0.03 0.00 - 0.07 K/uL  Urinalysis, Routine w reflex microscopic Urine, Clean Catch  Result Value Ref Range   Color, Urine AMBER (A) YELLOW   APPearance CLOUDY (A) CLEAR   Specific Gravity, Urine 1.026 1.005 - 1.030  pH 5.0 5.0 - 8.0   Glucose, UA NEGATIVE NEGATIVE mg/dL   Hgb urine dipstick NEGATIVE NEGATIVE   Bilirubin Urine SMALL (A) NEGATIVE   Ketones, ur NEGATIVE NEGATIVE mg/dL   Protein, ur 30 (A) NEGATIVE mg/dL   Nitrite NEGATIVE NEGATIVE   Leukocytes,Ua SMALL (A) NEGATIVE   RBC / HPF 6-10 0 - 5 RBC/hpf   WBC, UA 11-20 0 - 5 WBC/hpf   Bacteria, UA RARE (A) NONE SEEN   Squamous Epithelial / LPF 11-20 0 - 5   Mucus PRESENT    Hyaline Casts, UA PRESENT    Amorphous Crystal PRESENT    Ca Oxalate  Crys, UA PRESENT   Protime-INR  Result Value Ref Range   Prothrombin Time 12.9 11.4 - 15.2 seconds   INR 1.0 0.8 - 1.2  Ammonia  Result Value Ref Range   Ammonia 44 (H) 9 - 35 umol/L  Troponin I (High Sensitivity)  Result Value Ref Range   Troponin I (High Sensitivity) 5 <18 ng/L  Troponin I (High Sensitivity)  Result Value Ref Range   Troponin I (High Sensitivity) 5 <18 ng/L   CT Renal Stone Study  Result Date: 07/23/2022 CLINICAL DATA:  Flank pain. EXAM: CT ABDOMEN AND PELVIS WITHOUT CONTRAST TECHNIQUE: Multidetector CT imaging of the abdomen and pelvis was performed following the standard protocol without IV contrast. RADIATION DOSE REDUCTION: This exam was performed according to the departmental dose-optimization program which includes automated exposure control, adjustment of the mA and/or kV according to patient size and/or use of iterative reconstruction technique. COMPARISON:  June 15, 2022 FINDINGS: Lower chest: No acute abnormality. Hepatobiliary: No focal liver abnormality is seen. Status post cholecystectomy. No biliary dilatation. Pancreas: Diffuse atrophy of the pancreatic parenchyma is noted. There is no evidence of surrounding inflammatory fat stranding or pancreatic ductal dilatation. Spleen: The spleen is absent. Adrenals/Urinary Tract: Adrenal glands are unremarkable. Kidneys are normal in size, without focal lesions. Contrast is seen within the bilateral renal collecting systems. Contrast is seen throughout the lumen of a poorly distended urinary bladder. Stomach/Bowel: Stomach is within normal limits. Multiple surgical clips are seen within the region posterior to the body of the stomach. Surgically anastomosed bowel is seen within the anterior aspect of the mid abdomen, along the midline of the anterior aspect of the lower abdomen and within the lateral aspect of the right hemipelvis. There is no evidence of bowel dilatation. Mild to moderate severity small bowel wall  thickening is seen within the anterior aspect of the mid and lower abdomen. Vascular/Lymphatic: No significant vascular findings are present. An inferior vena cava filter is noted. Multiple prongs from the IVC filter appear to extend outside of the lumen of the inferior vena cava. This is a chronic finding. No enlarged abdominal or pelvic lymph nodes. Reproductive: Status post hysterectomy. No adnexal masses. Other: A surgical scar seen along the midline of the anterior abdominal and pelvic walls. No abdominopelvic ascites. Musculoskeletal: Postoperative changes are seen within the lower lumbar spine at the levels of L4 and L5. IMPRESSION: 1. Surgically anastomosed bowel seen throughout the abdomen with findings consistent with enteritis involving multiple small bowel loops within the mid and lower abdomen. 2. Evidence of prior cholecystectomy, splenectomy and hysterectomy. 3. Postoperative changes within the lower lumbar spine. Electronically Signed   By: Virgina Norfolk M.D.   On: 07/23/2022 01:12   CT Angio Chest PE W and/or Wo Contrast  Result Date: 07/22/2022 CLINICAL DATA:  Increasing confusion, shortness of breath, positive D-dimer  EXAM: CT ANGIOGRAPHY CHEST WITH CONTRAST TECHNIQUE: Multidetector CT imaging of the chest was performed using the standard protocol during bolus administration of intravenous contrast. Multiplanar CT image reconstructions and MIPs were obtained to evaluate the vascular anatomy. RADIATION DOSE REDUCTION: This exam was performed according to the departmental dose-optimization program which includes automated exposure control, adjustment of the mA and/or kV according to patient size and/or use of iterative reconstruction technique. CONTRAST:  48m OMNIPAQUE IOHEXOL 350 MG/ML SOLN COMPARISON:  09/29/2021 FINDINGS: Cardiovascular: This is a technically adequate evaluation of the pulmonary vasculature. No filling defects or pulmonary emboli. The heart is stable without pericardial  effusion. No evidence of thoracic aortic aneurysm or dissection. Left chest wall port tip within the superior vena cava at the atriocaval junction. Mediastinum/Nodes: No enlarged mediastinal, hilar, or axillary lymph nodes. Thyroid gland, trachea, and esophagus demonstrate no significant findings. Lungs/Pleura: No acute airspace disease, effusion, or pneumothorax. Scattered hypoventilatory changes at the lung bases. Central airways are patent. Upper Abdomen: No acute abnormality.  Stable cirrhosis. Musculoskeletal: No acute or destructive bony lesions. Bilateral shoulder arthroplasties. Reconstructed images demonstrate no additional findings. Review of the MIP images confirms the above findings. IMPRESSION: 1. No evidence of pulmonary embolus. 2. No acute intrathoracic process. Electronically Signed   By: MRanda NgoM.D.   On: 07/22/2022 22:16   CT Head Wo Contrast  Result Date: 07/22/2022 CLINICAL DATA:  History of multiple falls. EXAM: CT HEAD WITHOUT CONTRAST TECHNIQUE: Contiguous axial images were obtained from the base of the skull through the vertex without intravenous contrast. RADIATION DOSE REDUCTION: This exam was performed according to the departmental dose-optimization program which includes automated exposure control, adjustment of the mA and/or kV according to patient size and/or use of iterative reconstruction technique. COMPARISON:  June 01, 2022 FINDINGS: Brain: No evidence of acute infarction, hemorrhage, hydrocephalus or extra-axial collection. A stable, approximately 1.5 cm diameter partially calcified mass is seen within the left posterior fossa along the tentorium on the left. Vascular: No hyperdense vessel or unexpected calcification. Skull: Normal. Negative for fracture or focal lesion. Sinuses/Orbits: There is mild right ethmoid sinus and left-sided sphenoid sinus mucosal thickening. Other: None. IMPRESSION: 1. Findings consistent with the patient's calcified left posterior fossa  meningioma. 2. No acute intracranial abnormality. Electronically Signed   By: TVirgina NorfolkM.D.   On: 07/22/2022 22:15     EKG EKG Interpretation  Date/Time:  Friday July 22 2022 18:07:25 EDT Ventricular Rate:  70 PR Interval:  164 QRS Duration: 88 QT Interval:  418 QTC Calculation: 451 R Axis:   -6 Text Interpretation: Normal sinus rhythm Confirmed by PDory Horn on 07/22/2022 11:17:28 PM  Radiology CT Renal Stone Study  Result Date: 07/23/2022 CLINICAL DATA:  Flank pain. EXAM: CT ABDOMEN AND PELVIS WITHOUT CONTRAST TECHNIQUE: Multidetector CT imaging of the abdomen and pelvis was performed following the standard protocol without IV contrast. RADIATION DOSE REDUCTION: This exam was performed according to the departmental dose-optimization program which includes automated exposure control, adjustment of the mA and/or kV according to patient size and/or use of iterative reconstruction technique. COMPARISON:  June 15, 2022 FINDINGS: Lower chest: No acute abnormality. Hepatobiliary: No focal liver abnormality is seen. Status post cholecystectomy. No biliary dilatation. Pancreas: Diffuse atrophy of the pancreatic parenchyma is noted. There is no evidence of surrounding inflammatory fat stranding or pancreatic ductal dilatation. Spleen: The spleen is absent. Adrenals/Urinary Tract: Adrenal glands are unremarkable. Kidneys are normal in size, without focal lesions. Contrast is seen within the bilateral  renal collecting systems. Contrast is seen throughout the lumen of a poorly distended urinary bladder. Stomach/Bowel: Stomach is within normal limits. Multiple surgical clips are seen within the region posterior to the body of the stomach. Surgically anastomosed bowel is seen within the anterior aspect of the mid abdomen, along the midline of the anterior aspect of the lower abdomen and within the lateral aspect of the right hemipelvis. There is no evidence of bowel dilatation. Mild  to moderate severity small bowel wall thickening is seen within the anterior aspect of the mid and lower abdomen. Vascular/Lymphatic: No significant vascular findings are present. An inferior vena cava filter is noted. Multiple prongs from the IVC filter appear to extend outside of the lumen of the inferior vena cava. This is a chronic finding. No enlarged abdominal or pelvic lymph nodes. Reproductive: Status post hysterectomy. No adnexal masses. Other: A surgical scar seen along the midline of the anterior abdominal and pelvic walls. No abdominopelvic ascites. Musculoskeletal: Postoperative changes are seen within the lower lumbar spine at the levels of L4 and L5. IMPRESSION: 1. Surgically anastomosed bowel seen throughout the abdomen with findings consistent with enteritis involving multiple small bowel loops within the mid and lower abdomen. 2. Evidence of prior cholecystectomy, splenectomy and hysterectomy. 3. Postoperative changes within the lower lumbar spine. Electronically Signed   By: Virgina Norfolk M.D.   On: 07/23/2022 01:12   CT Angio Chest PE W and/or Wo Contrast  Result Date: 07/22/2022 CLINICAL DATA:  Increasing confusion, shortness of breath, positive D-dimer EXAM: CT ANGIOGRAPHY CHEST WITH CONTRAST TECHNIQUE: Multidetector CT imaging of the chest was performed using the standard protocol during bolus administration of intravenous contrast. Multiplanar CT image reconstructions and MIPs were obtained to evaluate the vascular anatomy. RADIATION DOSE REDUCTION: This exam was performed according to the departmental dose-optimization program which includes automated exposure control, adjustment of the mA and/or kV according to patient size and/or use of iterative reconstruction technique. CONTRAST:  40m OMNIPAQUE IOHEXOL 350 MG/ML SOLN COMPARISON:  09/29/2021 FINDINGS: Cardiovascular: This is a technically adequate evaluation of the pulmonary vasculature. No filling defects or pulmonary emboli.  The heart is stable without pericardial effusion. No evidence of thoracic aortic aneurysm or dissection. Left chest wall port tip within the superior vena cava at the atriocaval junction. Mediastinum/Nodes: No enlarged mediastinal, hilar, or axillary lymph nodes. Thyroid gland, trachea, and esophagus demonstrate no significant findings. Lungs/Pleura: No acute airspace disease, effusion, or pneumothorax. Scattered hypoventilatory changes at the lung bases. Central airways are patent. Upper Abdomen: No acute abnormality.  Stable cirrhosis. Musculoskeletal: No acute or destructive bony lesions. Bilateral shoulder arthroplasties. Reconstructed images demonstrate no additional findings. Review of the MIP images confirms the above findings. IMPRESSION: 1. No evidence of pulmonary embolus. 2. No acute intrathoracic process. Electronically Signed   By: MRanda NgoM.D.   On: 07/22/2022 22:16   CT Head Wo Contrast  Result Date: 07/22/2022 CLINICAL DATA:  History of multiple falls. EXAM: CT HEAD WITHOUT CONTRAST TECHNIQUE: Contiguous axial images were obtained from the base of the skull through the vertex without intravenous contrast. RADIATION DOSE REDUCTION: This exam was performed according to the departmental dose-optimization program which includes automated exposure control, adjustment of the mA and/or kV according to patient size and/or use of iterative reconstruction technique. COMPARISON:  June 01, 2022 FINDINGS: Brain: No evidence of acute infarction, hemorrhage, hydrocephalus or extra-axial collection. A stable, approximately 1.5 cm diameter partially calcified mass is seen within the left posterior fossa along the tentorium on  the left. Vascular: No hyperdense vessel or unexpected calcification. Skull: Normal. Negative for fracture or focal lesion. Sinuses/Orbits: There is mild right ethmoid sinus and left-sided sphenoid sinus mucosal thickening. Other: None. IMPRESSION: 1. Findings consistent with the  patient's calcified left posterior fossa meningioma. 2. No acute intracranial abnormality. Electronically Signed   By: Virgina Norfolk M.D.   On: 07/22/2022 22:15    Procedures Procedures    Medications Ordered in ED Medications  sodium chloride flush (NS) 0.9 % injection 10-40 mL ( Intracatheter Not Given 07/23/22 0038)  sodium chloride flush (NS) 0.9 % injection 10-40 mL (has no administration in time range)  Chlorhexidine Gluconate Cloth 2 % PADS 6 each (6 each Topical Not Given 07/23/22 0038)  iohexol (OMNIPAQUE) 350 MG/ML injection 75 mL (75 mLs Intravenous Contrast Given 07/22/22 2144)    ED Course/ Medical Decision Making/ A&P                           Medical Decision Making Patient with AMS and frequent falls this week referred in by pmd  Amount and/or Complexity of Data Reviewed Independent Historian: spouse    Details: See above  External Data Reviewed: notes.    Details: Previous notes reviewed  Labs: ordered.    Details: All labs reviewed:  Ammonia elevated 44.  Urine without UTI.  Normal coags.  Normal sodium 140, normal potassium 3.5, creatinine slightly high 1.09, LFTs only remarkable for alk phosphorus of 187 elevated.  Normal white 7.3, 13.4 normal, platelets normal  Radiology: ordered and independent interpretation performed.    Details: Negative head CT  Risk Prescription drug management. Decision regarding hospitalization.    Final Clinical Impression(s) / ED Diagnoses Final diagnoses:  Fall, initial encounter  Altered mental status, unspecified altered mental status type   The patient appears reasonably stabilized for admission considering the current resources, flow, and capabilities available in the ED at this time, and I doubt any other [facility] requiring further screening and/or treatment in the ED prior to admission.  Rx / DC Orders ED Discharge Orders     None         Jacquelyn Antony, MD 07/24/22 4132

## 2022-07-23 NOTE — ED Notes (Signed)
Patient transported to CT 

## 2022-07-24 DIAGNOSIS — R410 Disorientation, unspecified: Secondary | ICD-10-CM | POA: Diagnosis not present

## 2022-07-24 LAB — GLUCOSE, CAPILLARY
Glucose-Capillary: 138 mg/dL — ABNORMAL HIGH (ref 70–99)
Glucose-Capillary: 161 mg/dL — ABNORMAL HIGH (ref 70–99)
Glucose-Capillary: 116 mg/dL — ABNORMAL HIGH (ref 70–99)
Glucose-Capillary: 158 mg/dL — ABNORMAL HIGH (ref 70–99)
Glucose-Capillary: 169 mg/dL — ABNORMAL HIGH (ref 70–99)

## 2022-07-24 LAB — MAGNESIUM: Magnesium: 1.7 mg/dL (ref 1.7–2.4)

## 2022-07-24 LAB — BASIC METABOLIC PANEL
Anion gap: 8 (ref 5–15)
BUN: 10 mg/dL (ref 6–20)
CO2: 26 mmol/L (ref 22–32)
Calcium: 9 mg/dL (ref 8.9–10.3)
Chloride: 103 mmol/L (ref 98–111)
Creatinine, Ser: 0.98 mg/dL (ref 0.44–1.00)
GFR, Estimated: >60 mL/min (ref >60)
Glucose, Bld: 131 mg/dL — ABNORMAL HIGH (ref 70–99)
Potassium: 2.8 mmol/L — ABNORMAL LOW (ref 3.5–5.1)
Sodium: 137 mmol/L (ref 135–145)

## 2022-07-24 LAB — CBC
HCT: 38.5 % (ref 36.0–46.0)
Hemoglobin: 12.4 g/dL (ref 12.0–15.0)
MCH: 30.1 pg (ref 26.0–34.0)
MCHC: 32.2 g/dL (ref 30.0–36.0)
MCV: 93.4 fL (ref 80.0–100.0)
Platelets: 255 10*3/uL (ref 150–400)
RBC: 4.12 MIL/uL (ref 3.87–5.11)
RDW: 17.9 % — ABNORMAL HIGH (ref 11.5–15.5)
WBC: 6.9 10*3/uL (ref 4.0–10.5)
nRBC: 0 % (ref 0.0–0.2)

## 2022-07-24 LAB — C DIFFICILE QUICK SCREEN W PCR REFLEX
C Diff antigen: NEGATIVE
C Diff interpretation: NOT DETECTED
C Diff toxin: NEGATIVE

## 2022-07-24 MED ORDER — AMLODIPINE BESYLATE 5 MG PO TABS
5.0000 mg | ORAL_TABLET | Freq: Every day | ORAL | Status: DC
  Administered 2022-07-24: 5 mg via ORAL
  Filled 2022-07-24: qty 1

## 2022-07-24 MED ORDER — POTASSIUM CHLORIDE 20 MEQ PO PACK
40.0000 meq | PACK | Freq: Two times a day (BID) | ORAL | Status: DC
  Filled 2022-07-24: qty 2

## 2022-07-24 MED ORDER — CEPHALEXIN 500 MG PO CAPS
500.0000 mg | ORAL_CAPSULE | Freq: Two times a day (BID) | ORAL | Status: DC
  Administered 2022-07-24 – 2022-07-25 (×4): 500 mg via ORAL
  Filled 2022-07-24 (×3): qty 1

## 2022-07-24 MED ORDER — POTASSIUM CHLORIDE CRYS ER 20 MEQ PO TBCR
40.0000 meq | EXTENDED_RELEASE_TABLET | Freq: Two times a day (BID) | ORAL | Status: DC
  Administered 2022-07-24 – 2022-07-25 (×3): 40 meq via ORAL
  Filled 2022-07-24 (×3): qty 2

## 2022-07-24 NOTE — Progress Notes (Signed)
PROGRESS NOTE    Wendy Barber  DVV:[id number] DOB: 12-17-1960 DOA: 07/22/2022 PCP: [name], MD   Brief Narrative:  Wendy Barber is a 61 y.o. 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  Recently admitted 8/21-9/3 after multiple syncopal episodes at home and work-up revealed extensive DVT involving bilateral lower extremities and inferior vena cava.  Underwent mechanical thrombolysis.  IR had initially planned to remove IVC filter, however, procedure canceled as residual clot noted in the femoral vein.  Discharged on full dose Lovenox.  She was also treated for UTI and AKI.  She received 5 units PRBCs during this hospitalization for blood loss anemia.   She presents to the ED today for evaluation of confusion and multiple falls.  Hypertensive on arrival to the ED with blood pressure 180/119, remainder of vital signs stable.  Labs showing no leukocytosis, hemoglobin 13.4 (improved compared to prior labs), glucose 140, creatinine 1.0 (stable), alkaline phosphatase 186 with normal remainder of LFTs, troponin negative x2, ammonia 44, COVID test pending.  UA showing negative nitrite, small amount of leukocytes, and microscopy showing 11-20 WBCs and rare bacteria.  CT head showing findings consistent with the patient's calcified left posterior fossa meningioma and no acute intracranial abnormality.  CTA chest negative for PE or any other acute intrathoracic process.  CT renal stone study showing findings consistent with enteritis involving multiple small bowel loops within the mid and lower abdomen. No medications  administered in the ED.  TRH called to admit.  Assessment & Plan:   Acute metabolic encephalopathy: -Unknown etiology.  CT and MRI brain negative for any acute findings -UTI?  Hypertensive encephalopathy?   -On admission: Pt was not started on antibiotic given history of recent C. difficile infection. -Urine Culture is positive for gram-negative rods-susceptibility pending- start patient on Keflex 500 twice daily -UDS negative, TSH, B12: WNL, ammonia mildly elevated however at baseline -Clinically she is improving.  She is afebrile with no leukocytosis. -PT/OT evaluation-recommend no follow up -Fall precautions  Enteritis: Chronic diarrhea: History of recent C. difficile on 9/12: -She is afebrile with no leukocytosis.  CT abdomen/pelvis showing findings consistent with enteritis involving multiple small bowel loops within the mid and lower abdomen.  C. difficile PCR and GI pathogen panel pending.  On enteric precautions.  Uncontrolled hypertension: -On propranolol.  Continue hydralazine IV as needed.   -Blood pressure continue to be elevated.  Will add low-dose amlodipine.  Continue to monitor blood pressure closely  Hypokalemia: Likely due to diarrhea.  Replenished.  Magnesium level: WNL.  Repeat BMP tomorrow a.m.  Recurrent falls: -PT/OT.  On fall precautions  Extensive bilateral lower extremity DVT: History of PE -CTA chest negative for PE.  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 [name]   AKI: Likely dehydration.  Repeat BMP tomorrow a.m.  Coronary artery disease: Aortic atherosclerosis: Troponin x2 negative.  Patient denies any ACS symptoms.  Continue to monitor  Well-controlled type 2 diabetes: Last A1c 6.5% checked on 06/01/2022.  Continue sliding scale insulin. -Monitor blood sugar closely.  Iron  deficiency anemia due to chronic blood  loss: Currently stable.  Continue ferrous sulfate  Depression with anxiety: Continue home medication bupropion, BuSpar, citalopram  Obesity with BMI of 36: Diet modification, exercise and weight loss recommended.  History of essential tremors: Continue propanolol  Meningioma: -Reviewed CT head and MRI brain: Stable findings.  DVT prophylaxis: Lovenox Code Status: Full code Family Communication:  None present at bedside.  Plan of care discussed with patient in length and she verbalized understanding and agreed with it. Disposition Plan: To be determined  Consultants:  None none Procedures:  None  Antimicrobials:  None  Status is: Observation   Subjective: Patient seen and examined.  Reports headache has improved.  Has some chronic nausea and 7 episodes of watery stools since yesterday.  No blood per rectum.  Feeling better overall however still have difficulty finding words.  Objective: Vitals:   07/23/22 1850 07/23/22 1944 07/24/22 0100 07/24/22 0915  BP: (!) 180/96 (!) 153/84 (!) 144/85 (!) 165/90  Pulse: 72 71    Resp: '19 20 18   '$ Temp: 98.5 F (36.9 C) 98.6 F (37 C) 98.7 F (37.1 C) 97.7 F (36.5 C)  TempSrc: Oral Oral Oral Oral  SpO2: 95% 96%    Weight:  99.4 kg    Height:  '5\' 6"'$  (1.676 m)      Intake/Output Summary (Last 24 hours) at 07/24/2022 1118 Last data filed at 07/23/2022 1823 Gross per 24 hour  Intake 36.46 ml  Output --  Net 36.46 ml   Filed Weights   07/23/22 1944  Weight: 99.4 kg    Examination:  General exam: Appears calm and comfortable, on room air, communicating well, appears weak and sick  respiratory system: Clear to auscultation. Respiratory effort normal. Cardiovascular system: S1 & S2 heard, RRR. No JVD, murmurs, rubs, gallops or clicks. No pedal edema. Gastrointestinal system: Abdomen is nondistended, soft and nontender. No organomegaly or masses felt. Normal bowel sounds heard. Central  nervous system: Alert and oriented. No focal neurological deficits. Extremities: Symmetric 5 x 5 power. Skin: No rashes, lesions or ulcers Psychiatry: Judgement and insight appear normal. Mood & affect appropriate.    Data Reviewed: I have personally reviewed following labs and imaging studies  CBC: Recent Labs  Lab 07/22/22 1834 07/24/22 0633  WBC 7.3 6.9  NEUTROABS 3.7  --   HGB 13.4 12.4  HCT 41.9 38.5  MCV 96.1 93.4  PLT 268 185    Basic Metabolic Panel: Recent Labs  Lab 07/22/22 1834 07/24/22 0633 07/24/22 0803  NA 140 137  --   K 3.5 2.8*  --   CL 105 103  --   CO2 26 26  --   GLUCOSE 140* 131*  --   BUN 11 10  --   CREATININE 1.09* 0.98  --   CALCIUM 9.3 9.0  --   MG  --   --  1.7    GFR: Estimated Creatinine Clearance: 73.5 mL/min (by C-G formula based on SCr of 0.98 mg/dL). Liver Function Tests: Recent Labs  Lab 07/22/22 1834  AST 41  ALT 33  ALKPHOS 186*  BILITOT 0.6  PROT 7.2  ALBUMIN 3.7    No results for input(s): "LIPASE", "AMYLASE" in the last 168 hours. Recent Labs  Lab 07/22/22 2028  AMMONIA 44*    Coagulation Profile: Recent Labs  Lab 07/22/22 1834  INR 1.0    Cardiac Enzymes: No results for input(s): "CKTOTAL", "CKMB", "CKMBINDEX", "TROPONINI" in the last 168 hours. BNP (last 3 results) No results for input(s): "  PROBNP" in the last 8760 hours. HbA1C: No results for input(s): "HGBA1C" in the last 72 hours. CBG: Recent Labs  Lab 07/23/22 0249 07/23/22 0911 07/23/22 1710 07/24/22 0554  GLUCAP 114* 149* 128* 138*    Lipid Profile: No results for input(s): "CHOL", "HDL", "LDLCALC", "TRIG", "CHOLHDL", "LDLDIRECT" in the last 72 hours. Thyroid Function Tests: Recent Labs    07/23/22 0624  TSH 3.789    Anemia Panel: Recent Labs    07/23/22 0624  VITAMINB12 191    Sepsis Labs: No results for input(s): "PROCALCITON", "LATICACIDVEN" in the last 168 hours.  Recent Results (from the past 240 hour(s))  SARS  Coronavirus 2 by RT PCR (hospital order, performed in [facility] hospital lab) *cepheid single result test* Anterior Nasal Swab     Status: None   Collection Time: 07/23/22  1:02 AM   Specimen: Anterior Nasal Swab  Result Value Ref Range Status   SARS Coronavirus 2 by RT PCR NEGATIVE NEGATIVE Final    Comment: (NOTE) SARS-CoV-2 target nucleic acids are NOT DETECTED.  The SARS-CoV-2 RNA is generally detectable in upper and lower respiratory specimens during the acute phase of infection. The lowest concentration of SARS-CoV-2 viral copies this assay can detect is 250 copies / mL. A negative result does not preclude SARS-CoV-2 infection and should not be used as the sole basis for treatment or other patient management decisions.  A negative result may occur with improper specimen collection / handling, submission of specimen other than nasopharyngeal swab, presence of viral mutation(s) within the areas targeted by this assay, and inadequate number of viral copies (<250 copies / mL). A negative result must be combined with clinical observations, patient history, and epidemiological information.  Fact Sheet for Patients:   https://www.patel.info/  Fact Sheet for Healthcare Providers: https://hall.com/  This test is not yet approved or  cleared by the [place] FDA and has been authorized for detection and/or diagnosis of SARS-CoV-2 by FDA under an Emergency Use Authorization (EUA).  This EUA will remain in effect (meaning this test can be used) for the duration of the COVID-19 declaration under Section 564(b)(1) of the Act, 21 U.S.C. section 360bbb-3(b)(1), unless the authorization is terminated or revoked sooner.  Performed at [facility], [place] [address] [place], [place] [address]   Urine Culture     Status: Abnormal (Preliminary result)   Collection Time: 07/23/22  3:03 AM   Specimen: Urine, Clean Catch  Result Value Ref  Range Status   Specimen Description URINE, CLEAN CATCH  Final   Special Requests   Final    NONE Performed at [facility], [place] [address] [place], [place] [address]    Culture >=100,000 COLONIES/mL GRAM NEGATIVE RODS (A)  Final   Report Status PENDING  Incomplete      Radiology Studies: MR BRAIN WO CONTRAST  Result Date: 07/23/2022 CLINICAL DATA:  Delirium EXAM: MRI HEAD WITHOUT CONTRAST TECHNIQUE: Multiplanar, multiecho pulse sequences of the brain and surrounding structures were obtained without intravenous contrast. COMPARISON:  Head CT from yesterday FINDINGS: Brain: No acute infarction, hemorrhage, hydrocephalus, extra-axial collection or mass effect. 19 mm meningioma along the left para median posterior tentorium. No adjacent cortical edema. Few remote white matter insults Vascular: No change in flow voids. The meningioma narrows the distal straight sinus. Skull and upper cervical spine: Normal marrow signal. Sinuses/Orbits: Partial opacification of mastoid air cells with normal nasopharynx. IMPRESSION: 1. No emergent finding. 2. Known meningioma along the left tentorium measuring 19  mm. Electronically Signed   By: Jorje Guild M.D.   On: 07/23/2022 11:11   CT Renal Stone Study  Result Date: 07/23/2022 CLINICAL DATA:  Flank pain. EXAM: CT ABDOMEN AND PELVIS WITHOUT CONTRAST TECHNIQUE: Multidetector CT imaging of the abdomen and pelvis was performed following the standard protocol without IV contrast. RADIATION DOSE REDUCTION: This exam was performed according to the departmental dose-optimization program which includes automated exposure control, adjustment of the mA and/or kV according to patient size and/or use of iterative reconstruction technique. COMPARISON:  June 15, 2022 FINDINGS: Lower chest: No acute abnormality. Hepatobiliary: No focal liver abnormality is seen. Status post cholecystectomy. No biliary dilatation. Pancreas: Diffuse atrophy of the pancreatic parenchyma is  noted. There is no evidence of surrounding inflammatory fat stranding or pancreatic ductal dilatation. Spleen: The spleen is absent. Adrenals/Urinary Tract: Adrenal glands are unremarkable. Kidneys are normal in size, without focal lesions. Contrast is seen within the bilateral renal collecting systems. Contrast is seen throughout the lumen of a poorly distended urinary bladder. Stomach/Bowel: Stomach is within normal limits. Multiple surgical clips are seen within the region posterior to the body of the stomach. Surgically anastomosed bowel is seen within the anterior aspect of the mid abdomen, along the midline of the anterior aspect of the lower abdomen and within the lateral aspect of the right hemipelvis. There is no evidence of bowel dilatation. Mild to moderate severity small bowel wall thickening is seen within the anterior aspect of the mid and lower abdomen. Vascular/Lymphatic: No significant vascular findings are present. An inferior vena cava filter is noted. Multiple prongs from the IVC filter appear to extend outside of the lumen of the inferior vena cava. This is a chronic finding. No enlarged abdominal or pelvic lymph nodes. Reproductive: Status post hysterectomy. No adnexal masses. Other: A surgical scar seen along the midline of the anterior abdominal and pelvic walls. No abdominopelvic ascites. Musculoskeletal: Postoperative changes are seen within the lower lumbar spine at the levels of L4 and L5. IMPRESSION: 1. Surgically anastomosed bowel seen throughout the abdomen with findings consistent with enteritis involving multiple small bowel loops within the mid and lower abdomen. 2. Evidence of prior cholecystectomy, splenectomy and hysterectomy. 3. Postoperative changes within the lower lumbar spine. Electronically Signed   By: Virgina Norfolk M.D.   On: 07/23/2022 01:12   CT Angio Chest PE W and/or Wo Contrast  Result Date: 07/22/2022 CLINICAL DATA:  Increasing confusion, shortness of  breath, positive D-dimer EXAM: CT ANGIOGRAPHY CHEST WITH CONTRAST TECHNIQUE: Multidetector CT imaging of the chest was performed using the standard protocol during bolus administration of intravenous contrast. Multiplanar CT image reconstructions and MIPs were obtained to evaluate the vascular anatomy. RADIATION DOSE REDUCTION: This exam was performed according to the departmental dose-optimization program which includes automated exposure control, adjustment of the mA and/or kV according to patient size and/or use of iterative reconstruction technique. CONTRAST:  67m OMNIPAQUE IOHEXOL 350 MG/ML SOLN COMPARISON:  09/29/2021 FINDINGS: Cardiovascular: This is a technically adequate evaluation of the pulmonary vasculature. No filling defects or pulmonary emboli. The heart is stable without pericardial effusion. No evidence of thoracic aortic aneurysm or dissection. Left chest wall port tip within the superior vena cava at the atriocaval junction. Mediastinum/Nodes: No enlarged mediastinal, hilar, or axillary lymph nodes. Thyroid gland, trachea, and esophagus demonstrate no significant findings. Lungs/Pleura: No acute airspace disease, effusion, or pneumothorax. Scattered hypoventilatory changes at the lung bases. Central airways are patent. Upper Abdomen: No acute abnormality.  Stable cirrhosis. Musculoskeletal: No  acute or destructive bony lesions. Bilateral shoulder arthroplasties. Reconstructed images demonstrate no additional findings. Review of the MIP images confirms the above findings. IMPRESSION: 1. No evidence of pulmonary embolus. 2. No acute intrathoracic process. Electronically Signed   By: Randa Ngo M.D.   On: 07/22/2022 22:16   CT Head Wo Contrast  Result Date: 07/22/2022 CLINICAL DATA:  History of multiple falls. EXAM: CT HEAD WITHOUT CONTRAST TECHNIQUE: Contiguous axial images were obtained from the base of the skull through the vertex without intravenous contrast. RADIATION DOSE REDUCTION:  This exam was performed according to the departmental dose-optimization program which includes automated exposure control, adjustment of the mA and/or kV according to patient size and/or use of iterative reconstruction technique. COMPARISON:  June 01, 2022 FINDINGS: Brain: No evidence of acute infarction, hemorrhage, hydrocephalus or extra-axial collection. A stable, approximately 1.5 cm diameter partially calcified mass is seen within the left posterior fossa along the tentorium on the left. Vascular: No hyperdense vessel or unexpected calcification. Skull: Normal. Negative for fracture or focal lesion. Sinuses/Orbits: There is mild right ethmoid sinus and left-sided sphenoid sinus mucosal thickening. Other: None. IMPRESSION: 1. Findings consistent with the patient's calcified left posterior fossa meningioma. 2. No acute intracranial abnormality. Electronically Signed   By: Virgina Norfolk M.D.   On: 07/22/2022 22:15    Scheduled Meds:  buPROPion  150 mg Oral Daily   busPIRone  15 mg Oral TID   Chlorhexidine Gluconate Cloth  6 each Topical Daily   citalopram  40 mg Oral Daily   enoxaparin  100 mg Subcutaneous BID   ferrous sulfate  325 mg Oral Daily   insulin aspart  0-5 Units Subcutaneous QHS   insulin aspart  0-9 Units Subcutaneous TID WC   lidocaine  1 patch Transdermal Q24H   potassium chloride  40 mEq Oral BID   propranolol  20 mg Oral BID   sodium chloride flush  10-40 mL Intracatheter Q12H   Continuous Infusions:   LOS: 1 day   Time spent: 35 minutes   Kennetta Pavlovic Loann Quill, MD [facility]  If 7PM-7AM, please contact night-coverage [email or web address] 07/24/2022, 11:18 AM

## 2022-07-24 NOTE — Evaluation (Signed)
Occupational Therapy Evaluation Patient Details Name: Wendy Barber MRN: [id number] DOB: 06-22-1961 Today's Date: 07/24/2022   History of Present Illness Pt is a 61 y/o female admitted 9/29 to ED with AMS.   PMH includes DVT/PE (s/p IVC filter 2005), HTN, DM2, anemia, anxiety, autoimmune hepatitis, chronic fatigue, fibromyalgia, obesity, depression, CA of appendix metastatic.   Clinical Impression   Pt with hx of falls, does not typically use AD to ambulate. Reports falls happen when she moves too quickly when needing to have a BM. Stands to shower. Pt is assisted with IADLs, but is independent in self care. Presents with generalized weakness and difficulty with memory and maintaining train of thought during conversation. She is overall functioning at a set up to supervision level for safety. Will follow acutely, but do not anticipate pt will need post acute OT.      Recommendations for follow up therapy are one component of a multi-disciplinary discharge planning process, led by the attending physician.  Recommendations may be updated based on patient status, additional functional criteria and insurance authorization.   Follow Up Recommendations  No OT follow up    Assistance Recommended at Discharge Intermittent Supervision/Assistance  Patient can return home with the following Assistance with cooking/housework;Assist for transportation    Functional Status Assessment  Patient has not had a recent decline in their functional status  Equipment Recommendations  None recommended by OT    Recommendations for Other Services       Precautions / Restrictions Precautions Precautions: Fall Precaution Comments: pt with hx of syncope/falls      Mobility Bed Mobility Overal bed mobility: Modified Independent                  Transfers Overall transfer level: Modified independent Equipment used: None                      Balance Overall balance assessment: Modified  Independent, Needs assistance   Sitting balance-Leahy Scale: Normal       Standing balance-Leahy Scale: Good Standing balance comment: pt with period of unsteadiness at home                           ADL either performed or assessed with clinical judgement   ADL                                         General ADL Comments: overall functioning at a set up to supervision level for safety     Vision Baseline Vision/History: 1 Wears glasses Ability to See in Adequate Light: 0 Adequate Patient Visual Report: No change from baseline       Perception     Praxis      Pertinent Vitals/Pain Pain Assessment Pain Assessment: No/denies pain     Hand Dominance Right   Extremity/Trunk Assessment Upper Extremity Assessment Upper Extremity Assessment: Overall WFL for tasks assessed   Lower Extremity Assessment Lower Extremity Assessment: Defer to PT evaluation   Cervical / Trunk Assessment Cervical / Trunk Assessment: Normal   Communication Communication Communication: Expressive difficulties (pt with word finding difficulties)   Cognition Arousal/Alertness: Awake/alert Behavior During Therapy: WFL for tasks assessed/performed Overall Cognitive Status: Impaired/Different from baseline Area of Impairment: Memory  Memory: Decreased short-term memory         General Comments: loses train of thought when speaking, oriented and aware of medical plan     General Comments       Exercises     Shoulder Instructions      Home Living Family/patient expects to be discharged to:: Private residence Living Arrangements: Spouse/significant other Available Help at Discharge: Family;Available 24 hours/day Type of Home: House Home Access: Stairs to enter [employer] of Steps: 6 Entrance Stairs-Rails: Right Home Layout: One level     Bathroom Shower/Tub: Occupational psychologist: Standard      Home Equipment: Conservation officer, nature (2 wheels);Rollator (4 wheels);Shower seat;Cane - single point;BSC/3in1;Adaptive equipment Adaptive Equipment: Reacher        Prior Functioning/Environment Prior Level of Function : Independent/Modified Independent             Mobility Comments: Ambulating limited community distances, typically without DME but use of rollator PRN.  Doesn't get out alot ADLs Comments: pt stands to shower, assisted for meal prep and housekeeping, has not driven in months        OT Problem List:        OT Treatment/Interventions:      OT Goals(Current goals can be found in the care plan section)    OT Frequency:      Co-evaluation              AM-PAC OT "6 Clicks" Daily Activity     Outcome Measure Help from another person eating meals?: None Help from another person taking care of personal grooming?: None Help from another person toileting, which includes using toliet, bedpan, or urinal?: None Help from another person bathing (including washing, rinsing, drying)?: None Help from another person to put on and taking off regular upper body clothing?: None Help from another person to put on and taking off regular lower body clothing?: None 6 Click Score: 24   End of Session    Activity Tolerance: Patient tolerated treatment well Patient left: in bed;with call bell/phone within reach;with nursing/sitter in room  OT Visit Diagnosis: Unsteadiness on feet (R26.81);Muscle weakness (generalized) (M62.81)                Time: 6286-3817 OT Time Calculation (min): 21 min Charges:  OT General Charges $OT Visit: 1 Visit OT Evaluation $OT Eval Low Complexity: [place], OTR/L Acute Rehabilitation Services Office: [phone]   Malka So 07/24/2022, 11:49 AM

## 2022-07-25 ENCOUNTER — Other Ambulatory Visit (HOSPITAL_COMMUNITY): Payer: Self-pay

## 2022-07-25 DIAGNOSIS — R410 Disorientation, unspecified: Secondary | ICD-10-CM | POA: Diagnosis not present

## 2022-07-25 LAB — GLUCOSE, CAPILLARY
Glucose-Capillary: 133 mg/dL — ABNORMAL HIGH (ref 70–99)
Glucose-Capillary: 145 mg/dL — ABNORMAL HIGH (ref 70–99)

## 2022-07-25 LAB — BASIC METABOLIC PANEL
Anion gap: 8 (ref 5–15)
BUN: 14 mg/dL (ref 6–20)
CO2: 26 mmol/L (ref 22–32)
Calcium: 9 mg/dL (ref 8.9–10.3)
Chloride: 109 mmol/L (ref 98–111)
Creatinine, Ser: 0.91 mg/dL (ref 0.44–1.00)
GFR, Estimated: >60 mL/min (ref >60)
Glucose, Bld: 151 mg/dL — ABNORMAL HIGH (ref 70–99)
Potassium: 3.6 mmol/L (ref 3.5–5.1)
Sodium: 143 mmol/L (ref 135–145)

## 2022-07-25 LAB — GASTROINTESTINAL PANEL BY PCR, STOOL (REPLACES STOOL CULTURE)

## 2022-07-25 LAB — URINE CULTURE: Culture: >=100000 — AB

## 2022-07-25 LAB — MAGNESIUM: Magnesium: 1.9 mg/dL (ref 1.7–2.4)

## 2022-07-25 MED ORDER — VITAMIN B-12 1000 MCG PO TABS: 1000.0000 ug | ORAL_TABLET | Freq: Every day | ORAL | Status: DC

## 2022-07-25 MED ORDER — CYANOCOBALAMIN 1000 MCG PO TABS: 1000.0000 ug | ORAL_TABLET | Freq: Every day | ORAL | Status: DC

## 2022-07-25 MED ORDER — CEPHALEXIN 500 MG PO CAPS
500.0000 mg | ORAL_CAPSULE | Freq: Two times a day (BID) | ORAL | 0 refills | Status: AC
  Filled 2022-07-25: qty 3, 2d supply, fill #0

## 2022-07-25 MED ORDER — CYANOCOBALAMIN 1000 MCG/ML IJ SOLN
1000.0000 ug | Freq: Every day | INTRAMUSCULAR | Status: DC
  Filled 2022-07-25: qty 1

## 2022-07-25 MED ORDER — CYANOCOBALAMIN 1000 MCG/ML IJ SOLN
1000.0000 ug | Freq: Every day | INTRAMUSCULAR | Status: AC
  Administered 2022-07-25: 1000 ug via SUBCUTANEOUS
  Filled 2022-07-25: qty 1

## 2022-07-25 MED ORDER — AMLODIPINE BESYLATE 10 MG PO TABS
10.0000 mg | ORAL_TABLET | Freq: Every day | ORAL | Status: DC
  Administered 2022-07-25: 10 mg via ORAL
  Filled 2022-07-25: qty 1

## 2022-07-25 MED ORDER — ACETAMINOPHEN 325 MG PO TABS: 650.0000 mg | ORAL_TABLET | Freq: Four times a day (QID) | ORAL | Status: DC | PRN

## 2022-07-25 MED ORDER — HEPARIN SOD (PORK) LOCK FLUSH 100 UNIT/ML IV SOLN
500.0000 [IU] | INTRAVENOUS | Status: AC | PRN
  Administered 2022-07-25: 500 [IU]

## 2022-07-25 NOTE — Discharge Summary (Signed)
DISCHARGE SUMMARY  Wendy Barber  MR#: [id number]  DOB:01-16-1961  Date of Admission: 07/22/2022 Date of Discharge: 07/25/2022  Attending Physician:Leeza Heiner Hennie Duos, MD  Patient's UXN:ATFTDD, Ranell Patrick, MD  Consults: Oncology   Disposition: D/C home   Follow-up Appts:  Follow-up Information     [name], MD Follow up.   Specialties: Family Medicine, Sports Medicine Contact information: [address] [place] [address] [phone]                 Tests Needing Follow-up: -follow-up B12 level in 6-8 weeks as an outpatient will be required to determine if she is able to absorb B12 appropriately  Discharge Diagnoses: Metabolic encephalopathy due to E. coli urinary tract infection + B12 deficiency  Chronic persistent diarrhea Uncontrolled HTN Hypokalemia Recurrent falls Extensive bilateral lower extremity DVT with prior history of PE Metastatic appendiceal cancer Acute kidney injury DM2 Iron deficiency anemia due to chronic blood loss Depression/anxiety Obesity - Body mass index is 35.37 kg/m. Benign essential tremors Meningioma  Initial presentation: 61 year old with a history of OA, fibromyalgia, chronic pain and chronic fatigue syndromes, appendiceal cancer metastatic to intra-abdominal lymph nodes, CAD, DM2, HTN, cirrhosis of the liver, autoimmune hepatitis, chronic diarrhea, essential tremor, depression, obesity, DVT/PE status post IVC filter, chronic iron deficiency anemia, and meningioma who was admitted to the hospital 8/21-9/3 with bilateral lower extremity DVTs and clotting of her IVC underwent mechanical thrombolysis.  She was discharged home on full dose Lovenox.  She returned to the ED 9/29 with confusion and multiple falls.  Hemoglobin was 13.4 at presentation UA was positive at presentation.  CT head was without acute findings.  CTa chest at presentation was negative for PE.  Patient reported a 1 week history of forgetfulness  with confusion.  She endorsed urinary urgency.  Hospital Course:  Metabolic encephalopathy due to E. coli urinary tract infection + B12 deficiency CT and MRI brain negative for acute findings -UDS negative - TSH within normal limits -ammonia not markedly elevated above her baseline -improving with treatment of UTI - see B12 below   B12 deficiency Patient's B12 level found to be 191 during this admission -while technically this falls at the very low end of the "normal range" its not uncommon for patients to develop symptoms of B12 deficiency with a B12 level <400 -many of her subacute symptoms are consistent with B12 deficiency -she has been dosed with 1000 mcg B12 subcu x1 during his hospital stay -she has been instructed to initiate B12 1000 mcg oral daily beginning tomorrow -follow-up B12 level in 6-8 weeks as an outpatient will be required to determine if she is able to absorb B12 appropriately  Chronic persistent diarrhea CT abdomen/pelvis with findings consistent with enteritis -C. difficile negative this admission -COVID-negative -minimize antibiotic exposure in attempt to minimize risk of C. Difficile -discontinue PPI for secondary   Uncontrolled HTN Now well controlled with adjustment in medical therapy   Hypokalemia Due to GI losses/chronic diarrhea -magnesium normal -corrected with supplementation   Recurrent falls Cleared for discharge home independently by PT/OT   Extensive bilateral lower extremity DVT with prior history of PE CTa chest this admission negative for PE -continue Lovenox without change   Metastatic appendiceal cancer Originally treated in [place] in 2005 with local recurrence status post resection and HIPEC February 2022 -followed by Dr. [name]   Acute kidney injury Resolved with simple volume resuscitation   DM2 A1c 6.28 May 2022 -CBG well controlled  Iron deficiency anemia due to chronic blood loss   Depression/anxiety Continue usual home  medications   Obesity - Body mass index is 35.37 kg/m.   Benign essential tremors Continue propranolol   Meningioma Stable on follow-up imaging  Allergies as of 07/25/2022       Reactions   Penicillins Shortness Of Breath   Other reaction(s): Irregular Heart Rate, Other (See Comments) Rapid heartrate   Alprazolam Hives, Other (See Comments)   Hard to arouse, unresponsiveness   Ativan [lorazepam] Other (See Comments)   Note: tolerates midazolam fine Face & Throat Swelling.   Corticosteroids Other (See Comments)   Other reaction(s): Other (see comments) Psychotic behaviour   Erythromycin    Other reaction(s): Other (See Comments) Severe stomach pain   Prednisone Other (See Comments)   Anxiety & Nervous Breakdown.   Savella  [milnacipran]    Prednisolone Anxiety        Medication List     STOP taking these medications    diazepam 5 MG tablet Commonly known as: VALIUM   pantoprazole 40 MG tablet Commonly known as: PROTONIX       TAKE these medications    buPROPion 150 MG 24 hr tablet Commonly known as: WELLBUTRIN XL TAKE 1 TABLET DAILY   busPIRone 15 MG tablet Commonly known as: BUSPAR Take 1 tablet (15 mg total) by mouth 3 (three) times daily.   cephALEXin 500 MG capsule Commonly known as: KEFLEX Take 1 capsule (500 mg total) by mouth every 12 (twelve) hours for 3 doses.   citalopram 40 MG tablet Commonly known as: CELEXA TAKE 1 TABLET DAILY   cyanocobalamin 1000 MCG tablet Take 1 tablet (1,000 mcg total) by mouth daily. Start taking on: July 26, 2022   dicyclomine 20 MG tablet Commonly known as: BENTYL Take 1 tablet (20 mg total) by mouth in the morning, at noon, in the evening, and at bedtime.   diphenoxylate-atropine 2.5-0.025 MG tablet Commonly known as: LOMOTIL TAKE 2 TABLETS IN THE MORNING, AT NOON, IN THE EVENING AND AT BEDTIME What changed: See the new instructions.   enoxaparin 100 MG/ML injection Commonly known as:  LOVENOX Inject 1 mL (100 mg total) into the skin 2 (two) times daily. What changed: when to take this   eszopiclone 2 MG Tabs tablet Commonly known as: LUNESTA TAKE 1 TABLET(2 MG) BY MOUTH AT BEDTIME AS NEEDED FOR SLEEP What changed: See the new instructions.   famotidine 20 MG tablet Commonly known as: PEPCID TAKE 2 TABLETS TWICE A DAY   ferrous sulfate 325 (65 FE) MG tablet Take 1 tablet (325 mg total) by mouth daily.   hydrocortisone 2.5 % rectal cream Commonly known as: ANUSOL-HC Place rectally 4 (four) times daily as needed for hemorrhoids or anal itching. Use 4 times daily x5 days, then 4 times daily as needed.   loperamide 2 MG capsule Commonly known as: IMODIUM Take 2 capsules (4 mg total) by mouth 4 (four) times daily as needed for diarrhea or loose stools. What changed: when to take this   meclizine 25 MG tablet Commonly known as: ANTIVERT TAKE 1 TABLET(25 MG) BY MOUTH THREE TIMES DAILY AS NEEDED FOR DIZZINESS What changed: See the new instructions.   ondansetron 8 MG tablet Commonly known as: ZOFRAN Take 8 mg by mouth every 8 (eight) hours as needed for nausea.   orphenadrine 100 MG tablet Commonly known as: NORFLEX TAKE 1 TABLET(100 MG) BY MOUTH AT BEDTIME AS NEEDED FOR MUSCLE SPASMS   oxyCODONE 5  MG immediate release tablet Commonly known as: Roxicodone Take 1 tablet (5 mg total) by mouth every 4 (four) hours as needed for severe pain or breakthrough pain.   potassium chloride 10 MEQ tablet Commonly known as: KLOR-CON M Take 2 tablets (20 mEq total) by mouth daily.   pramoxine-hydrocortisone 1-1 % rectal cream Commonly known as: PROCTOCREAM-HC Place 1 Application rectally 2 (two) times daily.   promethazine 12.5 MG tablet Commonly known as: PHENERGAN Take 1 tablet (12.5 mg total) by mouth 2 (two) times daily as needed. What changed: when to take this   propranolol 20 MG tablet Commonly known as: INDERAL Take 1 tablet (20 mg total) by mouth 2 (two)  times daily. What changed: additional instructions        Day of Discharge BP (!) 172/79 (BP Location: Right Arm)   Pulse 75   Temp 97.6 F (36.4 C) (Oral)   Resp 16   Ht '5\' 6"'$  (1.676 m)   Wt 99.4 kg   SpO2 99%   BMI 35.37 kg/m   Physical Exam: General: No acute respiratory distress Lungs: Clear to auscultation bilaterally without wheezes or crackles Cardiovascular: Regular rate and rhythm without murmur gallop or rub normal S1 and S2 Abdomen: Nontender, nondistended, soft, bowel sounds positive, no rebound, no ascites, no appreciable mass Extremities: No significant cyanosis, clubbing, or edema bilateral lower extremities  Basic Metabolic Panel: Recent Labs  Lab 07/22/22 1834 07/24/22 0633 07/24/22 0803 07/25/22 0639  NA 140 137  --  143  K 3.5 2.8*  --  3.6  CL 105 103  --  109  CO2 26 26  --  26  GLUCOSE 140* 131*  --  151*  BUN 11 10  --  14  CREATININE 1.09* 0.98  --  0.91  CALCIUM 9.3 9.0  --  9.0  MG  --   --  1.7 1.9   CBC: Recent Labs  Lab 07/22/22 1834 07/24/22 0633  WBC 7.3 6.9  NEUTROABS 3.7  --   HGB 13.4 12.4  HCT 41.9 38.5  MCV 96.1 93.4  PLT 268 255   Time spent in discharge (includes decision making & examination of pt): 35 minutes  07/25/2022, 11:53 AM   Cherene Altes, MD [facility] Office  [phone]

## 2022-07-25 NOTE — Progress Notes (Signed)
Physical Therapy Treatment Patient Details Name: Wendy Barber MRN: [id number] DOB: 1961/01/27 Today's Date: 07/25/2022   History of Present Illness Pt is a 61 y/o female admitted 9/29 to ED with AMS.   PMH includes DVT/PE (s/p IVC filter 2005), HTN, DM2, anemia, anxiety, autoimmune hepatitis, chronic fatigue, fibromyalgia, obesity, depression, CA of appendix metastatic.    PT Comments    Pt received supine and agreeable to session with continued progress with functional mobility. Session focused on gait training and balance challenges for increased activity tolerance and improved balance/postural reactions. Pt demonstrating fair stability with gait with min guard without AD, with no overt LOB, however pt reaching for rail in hall/furniture intermittently for support and with noted drifting R/L with pt able to self correct in all instances. Pt able to accept balance challenges with ability to self correct all LOB with stepping and ankle strategies. Discussed DME use at home when spouse is unable to provide supervision with pt verbalizing understanding and benefits of use. Pt continues to benefit from skilled PT services to progress toward functional mobility goals.    Recommendations for follow up therapy are one component of a multi-disciplinary discharge planning process, led by the attending physician.  Recommendations may be updated based on patient status, additional functional criteria and insurance authorization.  Follow Up Recommendations  No PT follow up     Assistance Recommended at Discharge Set up Supervision/Assistance  Patient can return home with the following Help with stairs or ramp for entrance;Assist for transportation   Equipment Recommendations  None recommended by PT    Recommendations for Other Services       Precautions / Restrictions Precautions Precautions: Fall Precaution Comments: pt with hx of syncope/falls     Mobility  Bed Mobility Overal bed  mobility: Modified Independent                  Transfers Overall transfer level: Modified independent Equipment used: None                    Ambulation/Gait Ambulation/Gait assistance: Supervision, Min guard Gait Distance (Feet): 150 Feet (+40 with balance challenges) Assistive device: None (intermittent reaching for rail for support) Gait Pattern/deviations: Step-through pattern, Decreased stride length Gait velocity: decr     General Gait Details: slightly unsteady, reaching for rail intermittently for support, no LOB, slight drift to L with pt able to self correct   Stairs             Wheelchair Mobility    Modified Rankin (Stroke Patients Only)       Balance Overall balance assessment: Modified Independent, Needs assistance   Sitting balance-Leahy Scale: Normal       Standing balance-Leahy Scale: Good Standing balance comment: pt with period of unsteadiness at home, able to accept mild balance challenges without LOB Single Leg Stance - Right Leg: 5 Single Leg Stance - Left Leg: 10 Tandem Stance - Right Leg: 30 Tandem Stance - Left Leg: 30 [place] - Eyes Opened: 60   High level balance activites: Side stepping, Backward walking, Head turns High Level Balance Comments: able to complete without LOB, however with slowed pace            Cognition Arousal/Alertness: Awake/alert Behavior During Therapy: WFL for tasks assessed/performed Overall Cognitive Status: Impaired/Different from baseline Area of Impairment: Memory                     Memory: Decreased short-term memory  General Comments: loses train of thought when speaking, oriented and aware of medical plan        Exercises      General Comments General comments (skin integrity, edema, etc.): VSS on RA, discussed [place] of rollator at home for safety when pt husband not able to walk with her,  as pt with noted instability with gait reaching for outside  support intermittently, pt agreeable and verbalizing understanding      Pertinent Vitals/Pain Pain Assessment Pain Assessment: Faces Faces Pain Scale: Hurts little more Pain Location: headache Pain Descriptors / Indicators: Aching Pain Intervention(s): Monitored during session    Home Living                          Prior Function            PT Goals (current goals can now be found in the care plan section) Acute Rehab PT Goals PT Goal Formulation: With patient Time For Goal Achievement: 08/06/22    Frequency    Min 3X/week      PT Plan      Co-evaluation              AM-PAC PT "6 Clicks" Mobility   Outcome Measure  Help needed turning from your back to your side while in a flat bed without using bedrails?: None Help needed moving from lying on your back to sitting on the side of a flat bed without using bedrails?: None Help needed moving to and from a bed to a chair (including a wheelchair)?: None Help needed standing up from a chair using your arms (e.g., wheelchair or bedside chair)?: None Help needed to walk in hospital room?: A Little Help needed climbing 3-5 steps with a railing? : A Little 6 Click Score: 22    End of Session   Activity Tolerance: Patient tolerated treatment well;Patient limited by fatigue Patient left: with call bell/phone within reach;in chair;with nursing/sitter in room Nurse Communication: Mobility status PT Visit Diagnosis: Unsteadiness on feet (R26.81);Muscle weakness (generalized) (M62.81)     Time: 1583-0940 PT Time Calculation (min) (ACUTE ONLY): 21 min  Charges:  $Therapeutic Activity: 8-22 mins                    Margaruite Top R. PTA Acute Rehabilitation Services Office: [place] 07/25/2022, 9:57 AM

## 2022-07-25 NOTE — TOC Transition Note (Signed)
Our pharmacy isn't contracted with patient's insurance. Patient was called and informed, discussed options with patient for prescriptions to be filled, and patient decided to have sent to local pharmacy in [place].

## 2022-07-25 NOTE — Progress Notes (Signed)
Mobility Specialist Progress Note:   07/25/22 1217  Mobility  Activity Ambulated with assistance in hallway  Activity Response Tolerated well  Distance Ambulated (ft) 150 ft  $Mobility charge 1 Mobility  Level of Assistance Modified independent, requires aide device or extra time  Assistive Device Front wheel walker   Pt received in bed willing to participate in mobility. No complaints of pain. Left EOB with call bell in reach and all needs met.   [facility] Surveyor, mining Chat only

## 2022-07-25 NOTE — TOC Transition Note (Signed)
Transition of Care (TOC) - CM/SW Discharge Note Marvetta Gibbons RN, BSN Transitions of Care Unit 4E- RN Case Manager See Treatment Team for direct phone #    Patient Details  Name: Wendy Barber MRN: [id number] Date of Birth: July 03, 1961  Transition of Care [facility]) CM/SW Contact:  Dawayne Patricia, RN Phone Number: 07/25/2022, 12:11 PM   Clinical Narrative:    Transition of Care Department [facility]) has reviewed patient and no TOC needs have been identified at this time. Noted no f/u recommendations made by PT/OT, pt stable for transition home today. Pt from home w/ spouse.      Final next level of care: Home/Self Care Barriers to Discharge: No Barriers Identified   Patient Goals and CMS Choice     Choice offered to / list presented to : NA  Discharge Placement             Home          Discharge Plan and Services                DME Arranged: N/A DME Agency: NA       HH Arranged: NA HH Agency: NA        Social Determinants of Health (SDOH) Interventions     Readmission Risk Interventions    06/26/2022    2:52 PM 06/01/2022    3:59 PM 05/05/2022    8:39 AM  Readmission Risk Prevention Plan  Transportation Screening Complete Complete Complete  PCP or Specialist Appt within 5-7 Days   Complete  Home Care Screening   Complete  Medication Review (RN CM)   Complete  Medication Review Press photographer) Complete Complete   [place] or Home Care Consult Complete Complete   SW Recovery Care/Counseling Consult Complete Complete   Palliative Care Screening Not Applicable Not [place] Not Applicable Not Applicable

## 2022-07-26 ENCOUNTER — Telehealth: Payer: Self-pay | Admitting: Family Medicine

## 2022-07-26 NOTE — Telephone Encounter (Signed)
Placed in your to be signed folder  ?

## 2022-07-26 NOTE — Telephone Encounter (Signed)
I received Home Health Certification and Plan of Care fax from [place]. Place in front bin with charge sheet.

## 2022-07-28 ENCOUNTER — Telehealth: Payer: Self-pay

## 2022-07-28 ENCOUNTER — Ambulatory Visit
Admit: 2022-07-28 | Discharge: 2022-07-28 | Disposition: A | Discharge status: Home / Self Care | Payer: Medicare Other | Attending: Interventional Radiology | Admitting: Interventional Radiology

## 2022-07-28 ENCOUNTER — Other Ambulatory Visit: Payer: Medicare Other

## 2022-07-28 ENCOUNTER — Ambulatory Visit
Admit: 2022-07-28 | Discharge: 2022-07-28 | Disposition: A | Discharge status: Home / Self Care | Payer: Medicare Other | Attending: Student | Admitting: Student

## 2022-07-28 DIAGNOSIS — I82412 Acute embolism and thrombosis of left femoral vein: Secondary | ICD-10-CM

## 2022-07-28 NOTE — Telephone Encounter (Signed)
Transition Care Management Follow-up Telephone Call Date of discharge and from where: 07/25/2022  Wendy Barber  How have you been since you were released from the hospital? Better  Any questions or concerns? No  Items Reviewed: Did the pt receive and understand the discharge instructions provided? Yes  Medications obtained and verified? Yes  Other? No  Any new allergies since your discharge? No  Dietary orders reviewed? No Do you have support at home? Yes   Home Care and Equipment/Supplies: Were home health services ordered? no If so, what is the name of the agency? N/a  Has the agency set up a time to come to the patient's home? not applicable Were any new equipment or medical supplies ordered?  No What is the name of the medical supply agency? N/A Were you able to get the supplies/equipment? not applicable Do you have any questions related to the use of the equipment or supplies? No  Functional Questionnaire: (I = Independent and D = Dependent) ADLs: I  Bathing/Dressing- I  Meal Prep- I  Eating- I  Maintaining continence- I  Transferring/Ambulation- I  Managing Meds- I  Follow up appointments reviewed:  PCP Hospital f/u appt confirmed? Yes  Scheduled to see Dr.Greene on 08/04/2022 @ 1020am . [place] [facility] f/u appt confirmed? No   Are transportation arrangements needed? No  If their condition worsens, is the pt aware to call PCP or go to the Emergency Dept.? Yes Was the patient provided with contact information for the PCP's office or ED? Yes Was to pt encouraged to call back with questions or concerns? Yes

## 2022-07-28 NOTE — Progress Notes (Signed)
Patient ID: Wendy Barber, female   DOB: [birth year], 61 y.o.   MRN: [id number]        Chief Complaint: Caval occlusion, post mechanical thrombectomy; chronic IVC filter placement  Referring Physician(s): Ennever  History of Present Illness: Wendy Barber is a 60 y.o. female with complex past medical history significant for appendiceal carcinoma with associated pseudomyxoma peritonei previous DVT and pulmonary embolism, previously on long-term anticoagulation however this was held due to GI bleeding.  Unfortunately, holding the anticoagulant resulted in caval thrombosis for which the patient underwent mechanical thrombectomy by Dr. Vernard Gambles and myself on 06/20/2022.  Patient returns today to the interventional radiology clinic for postprocedural evaluation and management following acquisition of bilateral lower extremity venous Doppler ultrasound.  She is also being evaluated for potential IVC filter retrieval.  The IVC filter has been in place for greater than 15 years and was initially placed at outside institution.  The patient did have a recent emergency department visit for flank pain where she was ultimately diagnosed with C. difficile and a urinary tract infection.  Otherwise, patient has made a largely uneventful postoperative recovery, especially during the past week.  Importantly, the patient is tolerating her current anticoagulation regimen (Lovenox) well without repeat lower GI bleeding.  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[place]) 03/19/2020   Chronic fatigue syndrome    Diabetes mellitus without complication (HCC)    DVT of deep femoral vein, left ([place]) 03/19/2020   Fibromyalgia    Goals of care, counseling/discussion 03/19/2020   Hypertension    Iron deficiency anemia due to chronic blood loss 05/14/2021   Malignant pseudomyxoma peritonei  ([place]) 03/19/2020   Pernicious anemia 05/14/2021   Presence of IVC filter 03/19/2020   Pulmonary embolism, bilateral ([place]) 03/19/2020    Past Surgical History:  Procedure Laterality Date   ABDOMINAL HYSTERECTOMY     ABDOMINAL SURGERY     ACHILLES TENDON REPAIR     APPENDECTOMY     ARTHROPLASTY     CARPAL TUNNEL RELEASE     CHOLECYSTECTOMY     IR CATHETER TUBE CHANGE  02/02/2021   IR CATHETER TUBE CHANGE  02/25/2021   IR IMAGING GUIDED PORT INSERTION  06/20/2022   IR RADIOLOGIST EVAL & MGMT  02/24/2021   IR RADIOLOGIST EVAL & MGMT  03/10/2021   IR RADIOLOGIST EVAL & MGMT  06/22/2022   IR THROMBECT VENO MECH MOD SED  06/20/2022   IR US GUIDE BX ASP/DRAIN  11/25/2019   IR US GUIDE VASC ACCESS LEFT  06/20/2022   IR US GUIDE VASC ACCESS RIGHT  06/20/2022   IR US GUIDE VASC ACCESS RIGHT  06/20/2022   IR VENO/EXT/BI  06/20/2022   IR VENOCAVAGRAM IVC  06/20/2022   JOINT REPLACEMENT     KNEE ARTHROSCOPY     ORIF ANKLE FRACTURE Right 08/27/2019   Procedure: OPEN REDUCTION INTERNAL FIXATION RIGHT ANKLE FRACTURE;  Surgeon: Renette Butters, MD;  Location: [facility];  Service: Orthopedics;  Laterality: Right;   perineorrophy     TONGUE BIOPSY      Allergies: Penicillins, Alprazolam, Ativan [lorazepam], Corticosteroids, Erythromycin, Prednisone, Savella  [milnacipran], and Prednisolone  Medications: Prior to Admission medications   Medication Sig Start Date End Date Taking? Authorizing Provider  buPROPion (WELLBUTRIN XL) 150 MG 24 hr tablet TAKE 1 TABLET DAILY 07/11/22   Lesle Chris  A, NP  busPIRone (BUSPAR) 15 MG tablet Take 1 tablet (15 mg total) by mouth 3 (three) times daily. 04/05/22   Elwanda Brooklyn, NP  citalopram (CELEXA) 40 MG tablet TAKE 1 TABLET DAILY 07/11/22   Elwanda Brooklyn, NP  cyanocobalamin 1000 MCG tablet Take 1 tablet (1,000 mcg total) by mouth daily. 07/26/22   Cherene Altes, MD  dicyclomine (BENTYL) 20 MG tablet Take 1 tablet (20 mg total) by mouth in the morning, at noon, in the  evening, and at bedtime. 05/08/22   Aline August, MD  diphenoxylate-atropine (LOMOTIL) 2.5-0.025 MG tablet TAKE 2 TABLETS IN THE MORNING, AT NOON, IN THE EVENING AND AT BEDTIME Patient taking differently: Take 2 tablets by mouth 4 (four) times daily. 04/22/22   Volanda Napoleon, MD  enoxaparin (LOVENOX) 100 MG/ML injection Inject 1 mL (100 mg total) into the skin 2 (two) times daily. Patient taking differently: Inject 100 mg into the skin every 12 (twelve) hours. 06/24/22   Eugenie Filler, MD  eszopiclone (LUNESTA) 2 MG TABS tablet TAKE 1 TABLET(2 MG) BY MOUTH AT BEDTIME AS NEEDED FOR SLEEP Patient taking differently: Take 2 mg by mouth at bedtime. 06/07/22   Elwanda Brooklyn, NP  famotidine (PEPCID) 20 MG tablet TAKE 2 TABLETS TWICE A DAY Patient taking differently: Take 40 mg by mouth 2 (two) times daily. 03/01/22   Volanda Napoleon, MD  ferrous sulfate 325 (65 FE) MG tablet Take 1 tablet (325 mg total) by mouth daily. 06/03/22 06/03/23  Manuella Ghazi, Pratik D, DO  hydrocortisone (ANUSOL-HC) 2.5 % rectal cream Place rectally 4 (four) times daily as needed for hemorrhoids or anal itching. Use 4 times daily x5 days, then 4 times daily as needed. 06/26/22   Eugenie Filler, MD  loperamide (IMODIUM) 2 MG capsule Take 2 capsules (4 mg total) by mouth 4 (four) times daily as needed for diarrhea or loose stools. Patient taking differently: Take 4 mg by mouth in the morning, at noon, in the evening, and at bedtime. 05/08/22   Aline August, MD  meclizine (ANTIVERT) 25 MG tablet TAKE 1 TABLET(25 MG) BY MOUTH THREE TIMES DAILY AS NEEDED FOR DIZZINESS Patient taking differently: Take by mouth 3 (three) times daily as needed for dizziness. 07/19/22   [name], MD  ondansetron (ZOFRAN) 8 MG tablet Take 8 mg by mouth every 8 (eight) hours as needed for nausea. 03/03/21   [provider]  orphenadrine (NORFLEX) 100 MG tablet TAKE 1 TABLET(100 MG) BY MOUTH AT BEDTIME AS NEEDED FOR MUSCLE SPASMS 06/06/22    Volanda Napoleon, MD  oxyCODONE (ROXICODONE) 5 MG immediate release tablet Take 1 tablet (5 mg total) by mouth every 4 (four) hours as needed for severe pain or breakthrough pain. 06/04/22   Manuella Ghazi, Pratik D, DO  potassium chloride (KLOR-CON M) 10 MEQ tablet Take 2 tablets (20 mEq total) by mouth daily. 07/07/22   [name], MD  pramoxine-hydrocortisone (PROCTOCREAM-HC) 1-1 % rectal cream Place 1 Application rectally 2 (two) times daily. 07/04/22   Volanda Napoleon, MD  promethazine (PHENERGAN) 12.5 MG tablet Take 1 tablet (12.5 mg total) by mouth 2 (two) times daily as needed. Patient taking differently: Take 12.5 mg by mouth at bedtime. 04/21/22   [name], MD  propranolol (INDERAL) 20 MG tablet Take 1 tablet (20 mg total) by mouth 2 (two) times daily. Patient taking differently: Take 20 mg by mouth 2 (two) times daily. For tremors 04/21/22  [name], MD     Family History  Problem Relation Age of Onset   Depression Mother    Drug abuse Sister    Suicidality Sister    Alcohol abuse Brother    Drug abuse Brother     Social History   Socioeconomic History   Marital status: Married    Spouse name: Not on file   Number of children: 3   Years of education: Not on file   Highest education level: Some college, no degree  Occupational History   Occupation: disability  Tobacco Use   Smoking status: Never   Smokeless tobacco: Never  Vaping Use   Vaping Use: Never used  Substance and Sexual Activity   Alcohol use: No   Drug use: Never   Sexual activity: Yes    Birth control/protection: None    Comment: hysterectomy  Other Topics Concern   Not on file  Social History Narrative   Right handed   One story home   Drinks occasional caffeine   Social Determinants of Health   Financial Resource Strain: Not on file  Food Insecurity: Not on file  Transportation Needs: Not on file  Physical Activity: Not on file  Stress: Not on file  Social Connections: Not on  file    ECOG Status: 1 - Symptomatic but completely ambulatory  Review of Systems: A 12 point ROS discussed and pertinent positives are indicated in the HPI above.  All other systems are negative.  Review of Systems  Vital Signs: There were no vitals taken for this visit.    Physical Exam  Mallampati Score:     Imaging:  No acute or worsening lower extremity DVT.   Minimal amount of chronic nonocclusive wall thickening/DVT is seen bilaterally, right greater than left, markedly improved compared to image guided thrombectomy performed 06/20/2022.   Chronic nonocclusive wall thickening/DVT is seen within the right common femoral vein, the saphenofemoral junction, the mid aspect of the right femoral vein and the right popliteal vein.]  Chronic nonocclusive wall thickening/DVT is seen within the proximal aspects of the left femoral and deep femoral veins as well as the left saphenofemoral junction.  MR BRAIN WO CONTRAST  Result Date: 07/23/2022 CLINICAL DATA:  Delirium EXAM: MRI HEAD WITHOUT CONTRAST TECHNIQUE: Multiplanar, multiecho pulse sequences of the brain and surrounding structures were obtained without intravenous contrast. COMPARISON:  Head CT from yesterday FINDINGS: Brain: No acute infarction, hemorrhage, hydrocephalus, extra-axial collection or mass effect. 19 mm meningioma along the left para median posterior tentorium. No adjacent cortical edema. Few remote white matter insults Vascular: No change in flow voids. The meningioma narrows the distal straight sinus. Skull and upper cervical spine: Normal marrow signal. Sinuses/Orbits: Partial opacification of mastoid air cells with normal nasopharynx. IMPRESSION: 1. No emergent finding. 2. Known meningioma along the left tentorium measuring 19 mm. Electronically Signed   By: Jorje Guild M.D.   On: 07/23/2022 11:11   CT Renal Stone Study  Result Date: 07/23/2022 CLINICAL DATA:  Flank pain. EXAM: CT ABDOMEN AND PELVIS  WITHOUT CONTRAST TECHNIQUE: Multidetector CT imaging of the abdomen and pelvis was performed following the standard protocol without IV contrast. RADIATION DOSE REDUCTION: This exam was performed according to the departmental dose-optimization program which includes automated exposure control, adjustment of the mA and/or kV according to patient size and/or use of iterative reconstruction technique. COMPARISON:  June 15, 2022 FINDINGS: Lower chest: No acute abnormality. Hepatobiliary: No focal liver abnormality is seen. Status post cholecystectomy. No  biliary dilatation. Pancreas: Diffuse atrophy of the pancreatic parenchyma is noted. There is no evidence of surrounding inflammatory fat stranding or pancreatic ductal dilatation. Spleen: The spleen is absent. Adrenals/Urinary Tract: Adrenal glands are unremarkable. Kidneys are normal in size, without focal lesions. Contrast is seen within the bilateral renal collecting systems. Contrast is seen throughout the lumen of a poorly distended urinary bladder. Stomach/Bowel: Stomach is within normal limits. Multiple surgical clips are seen within the region posterior to the body of the stomach. Surgically anastomosed bowel is seen within the anterior aspect of the mid abdomen, along the midline of the anterior aspect of the lower abdomen and within the lateral aspect of the right hemipelvis. There is no evidence of bowel dilatation. Mild to moderate severity small bowel wall thickening is seen within the anterior aspect of the mid and lower abdomen. Vascular/Lymphatic: No significant vascular findings are present. An inferior vena cava filter is noted. Multiple prongs from the IVC filter appear to extend outside of the lumen of the inferior vena cava. This is a chronic finding. No enlarged abdominal or pelvic lymph nodes. Reproductive: Status post hysterectomy. No adnexal masses. Other: A surgical scar seen along the midline of the anterior abdominal and pelvic walls. No  abdominopelvic ascites. Musculoskeletal: Postoperative changes are seen within the lower lumbar spine at the levels of L4 and L5. IMPRESSION: 1. Surgically anastomosed bowel seen throughout the abdomen with findings consistent with enteritis involving multiple small bowel loops within the mid and lower abdomen. 2. Evidence of prior cholecystectomy, splenectomy and hysterectomy. 3. Postoperative changes within the lower lumbar spine. Electronically Signed   By: Virgina Norfolk M.D.   On: 07/23/2022 01:12   CT Angio Chest PE W and/or Wo Contrast  Result Date: 07/22/2022 CLINICAL DATA:  Increasing confusion, shortness of breath, positive D-dimer EXAM: CT ANGIOGRAPHY CHEST WITH CONTRAST TECHNIQUE: Multidetector CT imaging of the chest was performed using the standard protocol during bolus administration of intravenous contrast. Multiplanar CT image reconstructions and MIPs were obtained to evaluate the vascular anatomy. RADIATION DOSE REDUCTION: This exam was performed according to the departmental dose-optimization program which includes automated exposure control, adjustment of the mA and/or kV according to patient size and/or use of iterative reconstruction technique. CONTRAST:  25m OMNIPAQUE IOHEXOL 350 MG/ML SOLN COMPARISON:  09/29/2021 FINDINGS: Cardiovascular: This is a technically adequate evaluation of the pulmonary vasculature. No filling defects or pulmonary emboli. The heart is stable without pericardial effusion. No evidence of thoracic aortic aneurysm or dissection. Left chest wall port tip within the superior vena cava at the atriocaval junction. Mediastinum/Nodes: No enlarged mediastinal, hilar, or axillary lymph nodes. Thyroid gland, trachea, and esophagus demonstrate no significant findings. Lungs/Pleura: No acute airspace disease, effusion, or pneumothorax. Scattered hypoventilatory changes at the lung bases. Central airways are patent. Upper Abdomen: No acute abnormality.  Stable cirrhosis.  Musculoskeletal: No acute or destructive bony lesions. Bilateral shoulder arthroplasties. Reconstructed images demonstrate no additional findings. Review of the MIP images confirms the above findings. IMPRESSION: 1. No evidence of pulmonary embolus. 2. No acute intrathoracic process. Electronically Signed   By: MRanda NgoM.D.   On: 07/22/2022 22:16   CT Head Wo Contrast  Result Date: 07/22/2022 CLINICAL DATA:  History of multiple falls. EXAM: CT HEAD WITHOUT CONTRAST TECHNIQUE: Contiguous axial images were obtained from the base of the skull through the vertex without intravenous contrast. RADIATION DOSE REDUCTION: This exam was performed according to the departmental dose-optimization program which includes automated exposure control, adjustment of the mA  and/or kV according to patient size and/or use of iterative reconstruction technique. COMPARISON:  June 01, 2022 FINDINGS: Brain: No evidence of acute infarction, hemorrhage, hydrocephalus or extra-axial collection. A stable, approximately 1.5 cm diameter partially calcified mass is seen within the left posterior fossa along the tentorium on the left. Vascular: No hyperdense vessel or unexpected calcification. Skull: Normal. Negative for fracture or focal lesion. Sinuses/Orbits: There is mild right ethmoid sinus and left-sided sphenoid sinus mucosal thickening. Other: None. IMPRESSION: 1. Findings consistent with the patient's calcified left posterior fossa meningioma. 2. No acute intracranial abnormality. Electronically Signed   By: Virgina Norfolk M.D.   On: 07/22/2022 22:15    Labs:  CBC: Recent Labs    06/30/22 1405 07/04/22 1429 07/22/22 1834 07/24/22 0633  WBC 6.7 7.6 7.3 6.9  HGB 11.6* 12.5 13.4 12.4  HCT 37.1 39.1 41.9 38.5  PLT 238 272 268 255    COAGS: Recent Labs    05/31/22 1945 06/13/22 1841 07/22/22 1834  INR 1.1 1.3* 1.0  APTT  --  32  --     BMP: Recent Labs    07/04/22 1429 07/07/22 1450 07/22/22 1834  07/24/22 0633 07/25/22 0639  NA 141 141 140 137 143  K 3.0* 3.7 3.5 2.8* 3.6  CL 103 103 105 103 109  CO2 '29 29 26 26 26  '$ GLUCOSE 175* 125* 140* 131* 151*  BUN '9 14 11 10 14  '$ CALCIUM 9.3 9.3 9.3 9.0 9.0  CREATININE 0.92 1.09 1.09* 0.98 0.91  GFRNONAA >60  --  59* >60 >60    LIVER FUNCTION TESTS: Recent Labs    06/25/22 0500 06/30/22 1405 07/04/22 1429 07/22/22 1834  BILITOT 0.7 0.5 0.7 0.6  AST '19 27 28 '$ 41  ALT '12 16 18 '$ 33  ALKPHOS 98 122 153* 186*  PROT 6.3* 6.8 6.7 7.2  ALBUMIN 2.8* 3.5 3.7 3.7    TUMOR MARKERS: No results for input(s): "AFPTM", "CEA", "CA199", "CHROMGRNA" in the last 8760 hours.  Assessment and Plan:  Wendy Barber is a 61 y.o. female with complex past medical history significant for appendiceal carcinoma with associated pseudomyxoma peritonei previous DVT and pulmonary embolism, previously on long-term anticoagulation however this was held due to GI bleeding.  Unfortunately, holding the anticoagulant resulted in caval thrombosis for which the patient underwent mechanical thrombectomy by Dr. Vernard Gambles and myself on 06/20/2022.  Patient has made a steady postoperative recovery, especially during the past week, and most importantly is tolerating her current anticoagulation regimen (Lovenox) well without repeat lower GI bleeding.  Personal review of bilateral lower extremity venous upper ultrasound performed earlier today demonstrates no acute or worsening lower extremity DVT.  There is a minimal amount of chronic nonocclusive wall thickening/DVT is seen bilaterally, right greater than left, markedly improved compared to image guided thrombectomy performed 06/20/2022.   Given patient's history of multiple previous DVT and pulmonary emboli she will have to be on lifelong anticoagulation.  Would recommend transitioning from Lovenox to a oral anticoagulate approximately 6 weeks following the intervention at the discretion of Dr. [name].  Compliance with the  anticoagulation was stressed to both the patient and the patient's husband with excellent understanding.  In regards to the chronic IVC filter, selected images from most recent noncontrast CT scan of the abdomen pelvis performed 07/23/2022, preprocedural CT scan performed 06/15/2022 as well as intraprocedural images during lower extremity and caval thrombectomy performed 06/20/2022 were reviewed in detail with the patient and the patient's husband.  It is uncertain  to whether the chronic IVC filter serves as the etiology of the patient's caval thrombosis or served as a potentially life-saving device preventing large burden pulmonary embolism.    While it is impossible to know definitively, I would favor the IVC filter served as a life-saving device given the technically excellent result following the mechanical thrombectomy and the relative normal appearance of the IVC following the procedure.  Additionally, this suspicion is supported by the lack of significantly hypertrophied retroperitoneal venous collaterals that are typically seen in the setting of a filter related chronic caval stenosis.  I explained the filter is partially fractured with one of the portions of one of the legs located within an extravascular location, adjacent to the right renal vein, while the remainder of the legs extend outside the confines of the IVC.  I explained that given the chronicity of the IVC filter (in place for greater than 15 years) removal would be challenging and potentially not possible and would potentially require caval stent placement if only a portion of the IVC filter is able to be retrieved.    Following this prolonged and detailed conversation, the patient wishes to maintain the chronic IVC filter.  Given the potentially high likelihood of inability to remove the filter and/or complication associated with attempted filter retrieval, I feel that this is a very reasonable option.  A copy of this report was  sent to the requesting provider on this date.  Electronically Signed: Sandi Mariscal 07/28/2022, 5:08 PM   I spent a total of 25 Minutes in face to face in clinical consultation, greater than 50% of which was counseling/coordinating care for post mechanical and lower extremity thrombectomy and chronic IVC filter management

## 2022-07-29 ENCOUNTER — Telehealth: Payer: Self-pay | Admitting: Family Medicine

## 2022-07-29 NOTE — Telephone Encounter (Signed)
Caller name: Neoma Laming Physical Therapist  On DPR? :yes/no: No  Call back number: [phone]  Provider they see: Carlota Raspberry   Reason for call: calling to advise that pt wanted to push resumption of physical therapy back until 08/03/22.

## 2022-08-03 ENCOUNTER — Telehealth: Payer: Self-pay | Admitting: Neurology

## 2022-08-03 DIAGNOSIS — D352 Benign neoplasm of pituitary gland: Secondary | ICD-10-CM

## 2022-08-03 NOTE — Telephone Encounter (Signed)
MRI of the pituitary gland confirms an adenoma which is typically benign mass.  However, I would like her evaluated by endocrinology to make sure it isn't causing changes in her hormones.

## 2022-08-03 NOTE — Addendum Note (Signed)
Addended by: Venetia Night on: 08/03/2022 09:50 AM   Modules accepted: Orders

## 2022-08-03 NOTE — Addendum Note (Signed)
Addended by: Venetia Night on: 08/03/2022 10:15 AM   Modules accepted: Orders

## 2022-08-03 NOTE — Telephone Encounter (Addendum)
Patient advised of Dr.Jaffe note below. Referral sent to [facility].

## 2022-08-04 ENCOUNTER — Telehealth (INDEPENDENT_AMBULATORY_CARE_PROVIDER_SITE_OTHER): Payer: Medicare Other | Admitting: Family Medicine

## 2022-08-04 ENCOUNTER — Encounter: Payer: Self-pay | Admitting: Family Medicine

## 2022-08-04 VITALS — Ht 66.0 in | Wt 220.0 lb

## 2022-08-04 DIAGNOSIS — E538 Deficiency of other specified B group vitamins: Secondary | ICD-10-CM

## 2022-08-04 DIAGNOSIS — N390 Urinary tract infection, site not specified: Secondary | ICD-10-CM

## 2022-08-04 DIAGNOSIS — G9341 Metabolic encephalopathy: Secondary | ICD-10-CM | POA: Diagnosis not present

## 2022-08-04 DIAGNOSIS — Z9181 History of falling: Secondary | ICD-10-CM

## 2022-08-04 DIAGNOSIS — N179 Acute kidney failure, unspecified: Secondary | ICD-10-CM | POA: Diagnosis not present

## 2022-08-04 DIAGNOSIS — I10 Essential (primary) hypertension: Secondary | ICD-10-CM

## 2022-08-04 DIAGNOSIS — B9620 Unspecified Escherichia coli [E. coli] as the cause of diseases classified elsewhere: Secondary | ICD-10-CM

## 2022-08-04 DIAGNOSIS — K529 Noninfective gastroenteritis and colitis, unspecified: Secondary | ICD-10-CM

## 2022-08-04 DIAGNOSIS — E876 Hypokalemia: Secondary | ICD-10-CM

## 2022-08-04 NOTE — Progress Notes (Signed)
Virtual Visit via Video Note  I connected with Wendy Barber on 08/04/22 at 10:58 AM by a video enabled telemedicine application and verified that I am speaking with the correct person using two identifiers.  Patient location: home - by self.  My location: office - [place].    I discussed the limitations, risks, security and privacy concerns of performing an evaluation and management service by telephone and the availability of in person appointments. I also discussed with the patient that there may be a patient responsible charge related to this service. The patient expressed understanding and agreed to proceed, consent obtained  Chief complaint:  Chief Complaint  Patient presents with   Hospitial Follow up    Pt states she was in the hospital for SOB, dizziness, chest pain, confusion. Pt states they sais that her B12 levels were off so they gave her a shot and she states she feels a little better, Pt states they completed an MRI of her pituitary gland and it came back questionable   Depression    PHQ9 - 16    History of Present Illness: Wendy Barber is a 61 y.o. female  Hospital follow-up.  Due to diarrhea, virtual visit requested today. Admitted 07/22/22 through 07/25/2022.  Transition of care phone call placed 1 week ago, noted. 44mn chart review   Metabolic encephalopathy due to E. coli urinary tract infection and B12 deficiency Initially seen in my office on September 29, concern for mental status changes at time along with urinary urgency and falls.- sent to ER for further evaluation.  CT and MRI brain negative for acute findings.  UDS negative.  TSH was normal.  Initial urinalysis with small LE, 11-20 white blood cells, rare bacteria.  Urine culture greater than 100,000 colonies E. coli.  Treated with Keflex 500 mg twice daily.  Clinically improved during hospitalization.  PT/OT eval without recommended follow-up.  B12 191.  Based on low normal range, concern for  symptoms as less than 400.  Treated with B12 1000 mcg x 1 subcutaneous once then plan for outpatient B12 1000 mcg daily with repeat levels in 6 to 8 weeks.   Overall better. Still some dizzy spells at times. Appetite better. Drinking fluids. No falls since coming to hospital.  Has completed keflex. Urinary symptoms have resolved.   Taking B12 10044m daily.  Hx of iron deficiency - taking iron supplement daily.   Lab Results  Component Value Date   WBC 6.9 07/24/2022   HGB 12.4 07/24/2022   HCT 38.5 07/24/2022   MCV 93.4 07/24/2022   PLT 255 07/24/2022   Lab Results  Component Value Date   IRON 91 06/24/2022   TIBC 279 06/24/2022   FERRITIN 10 (L) 06/01/2022    AKI Acute kidney injury resolved with volume resuscitation during hospital stay. Drinking fluids.  Lab Results  Component Value Date   CREATININE 0.91 07/25/2022  Also noted hypokalemia that repleted with supplementation, magnesium normal during hospitalization. Lab Results  Component Value Date   K 3.6 07/25/2022  Drinking fluids.     Uncontrolled hypertension Elevated readings during recent hospitalization.  Treated with propanolol and hydralazine IV as needed.  Low-dose amlodipine added.. Marland KitchenShe was discharged on home dose of propanolol, 20 mg twice daily.not taking amlodipine outpatient.  Has been having some frontal headaches past 30 days. Has not discussed with neuro yet.  No recent home BP readings.  BP Readings from Last 3 Encounters:  07/25/22 (!) 172/79  07/22/22 124/82  07/07/22  138/88    Chronic diarrhea: Unfortunately ongoing issue for her.  Typical diarrhea today. No new symptoms, no fever. Complicated history with prior surgeries and history of pseudomyxoma peritonei with metastatic appendiceal cancer.  Hematologist Dr. [name].  Has been discussed with her hematologist, with option of possible surgical treatments - did not meet with surgeon yet. Plans to discuss at follow up with Dr. [name].  Treated  for C. difficile infection in September, fidaxomicin prescribed by her hematologist.  Enteritis noted on imaging at most recent hospitalization.  GI pathogen panel negative. , C. difficile PCR obtained - negative.  PPI discontinued. Same diarrhea, no significant heartburn off PPI. Resolves with 2 tums if needed.   Telephone note reviewed from neurology yesterday.  MRI of the pituitary gland confirmed adenoma which is typically benign but plan for endocrinology follow-up to make sure it was not changing her hormones.   On meds for depression, no SI.     08/04/2022    9:56 AM 07/07/2022    1:17 PM 05/12/2022   12:16 PM 04/21/2022    3:57 PM 11/29/2021    2:39 PM  Depression screen PHQ 2/9  Decreased Interest '2 3 3 3 3  '$ Down, Depressed, Hopeless '3 3 3 3 3  '$ PHQ - 2 Score '5 6 6 6 6  '$ Altered sleeping 0 '3 3 3 2  '$ Tired, decreased energy '3 3 3 3 3  '$ Change in appetite '3 3 3 3 3  '$ Feeling bad or failure about yourself  '2 3 3 3 2  '$ Trouble concentrating '3 3 3 3 2  '$ Moving slowly or fidgety/restless 0 '3 2 2 '$ 0  Suicidal thoughts 0 '2 2 2 1  '$ PHQ-9 Score '16 26 25 25 19  '$ Difficult doing work/chores    Very difficult Not difficult at all    Patient Active Problem List   Diagnosis Date Noted   Enteritis 07/23/2022   Fall at home, initial encounter 07/23/2022   AMS (altered mental status) 07/23/2022   Hemorrhoids 06/25/2022   Occasional tremors: Essential tremors 06/23/2022   Hypomagnesemia 06/22/2022   Iron deficiency anemia    Gastritis 06/18/2022   Depression 06/18/2022   UTI (urinary tract infection) 06/16/2022   DVT, bilateral lower limbs ([place]) 06/14/2022   Symptomatic bradycardia 06/13/2022   History of excision of intestinal structure 06/13/2022   CAD (coronary artery disease) 06/13/2022   Acute lower GI bleeding 05/04/2022   AKI (acute kidney injury) ([place]) 05/04/2022   History of deep vein thrombosis (DVT) of lower extremity 05/04/2022   History of pulmonary embolus (PE) 05/04/2022    Anxiety 05/04/2022   Pernicious anemia 05/14/2021   Iron deficiency anemia due to chronic blood loss 05/14/2021   Abdominal wall abscess    Surgical wound infection 01/16/2021   C. difficile colitis 01/16/2021   Type 2 diabetes mellitus with hyperglycemia ([place]) 08/26/2020   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[place]) 03/19/2020   Goals of care, counseling/discussion 03/19/2020   Malignant pseudomyxoma peritonei ([place]) 03/19/2020   DVT of deep femoral vein, left ([place]) 03/19/2020   Pulmonary embolism, bilateral ([place]) 03/19/2020   Presence of IVC filter 03/19/2020   Hepatic encephalopathy ([place]) 11/22/2019   Confusion 11/21/2019   Autoimmune hepatitis ([place]) 11/21/2019   Uncontrolled hypertension 11/21/2019   DM2 (diabetes mellitus, type 2) ([place]) 11/21/2019   Closed right ankle fracture 08/27/2019   Acute deep vein thrombosis (DVT) of femoral vein of left lower extremity ([place]) 08/03/2018   History of colon cancer  08/03/2018   History of partial colectomy 08/03/2018   Spinal stenosis 08/03/2018   Morbid obesity ([place]) 08/03/2018   Cerebral meningioma ([place]) 01/05/2018   Arthritis of right shoulder region 02/18/2017   History of renal calculi 02/14/2016   Midline low back pain 02/14/2016   Class 3 obesity ([place]) 01/07/2016   Migraine with aura and without status migrainosus, not intractable 01/07/2016   Chronic fatigue syndrome 09/23/2013   GERD (gastroesophageal reflux disease) 09/23/2013   History of hepatitis A 09/23/2013   Rheumatoid arthritis involving multiple sites ([place]) 09/23/2013   Past Medical History:  Diagnosis Date   Arthritis    Back pain    Cancer ([place])    pseudomyxoma peritonei   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[place]) 03/19/2020   Chronic fatigue syndrome    Diabetes mellitus without complication (H[facility])    DVT of deep femoral vein, left ([place]) 03/19/2020   Fibromyalgia    Goals of care, counseling/discussion 03/19/2020    Hypertension    Iron deficiency anemia due to chronic blood loss 05/14/2021   Malignant pseudomyxoma peritonei ([place]) 03/19/2020   Pernicious anemia 05/14/2021   Presence of IVC filter 03/19/2020   Pulmonary embolism, bilateral ([place]) 03/19/2020   Past Surgical History:  Procedure Laterality Date   ABDOMINAL HYSTERECTOMY     ABDOMINAL SURGERY     ACHILLES TENDON REPAIR     APPENDECTOMY     ARTHROPLASTY     CARPAL TUNNEL RELEASE     CHOLECYSTECTOMY     IR CATHETER TUBE CHANGE  02/02/2021   IR CATHETER TUBE CHANGE  02/25/2021   IR IMAGING GUIDED PORT INSERTION  06/20/2022   IR RADIOLOGIST EVAL & MGMT  02/24/2021   IR RADIOLOGIST EVAL & MGMT  03/10/2021   IR RADIOLOGIST EVAL & MGMT  06/22/2022   IR THROMBECT VENO MECH MOD SED  06/20/2022   IR US GUIDE BX ASP/DRAIN  11/25/2019   IR US GUIDE VASC ACCESS LEFT  06/20/2022   IR US GUIDE VASC ACCESS RIGHT  06/20/2022   IR US GUIDE VASC ACCESS RIGHT  06/20/2022   IR VENO/EXT/BI  06/20/2022   IR VENOCAVAGRAM IVC  06/20/2022   JOINT REPLACEMENT     KNEE ARTHROSCOPY     ORIF ANKLE FRACTURE Right 08/27/2019   Procedure: OPEN REDUCTION INTERNAL FIXATION RIGHT ANKLE FRACTURE;  Surgeon: Renette Butters, MD;  Location: [facility];  Service: Orthopedics;  Laterality: Right;   perineorrophy     TONGUE BIOPSY     Allergies  Allergen Reactions   Penicillins Shortness Of Breath    Other reaction(s): Irregular Heart Rate, Other (See Comments) Rapid heartrate    Alprazolam Hives and Other (See Comments)    Hard to arouse, unresponsiveness   Ativan [Lorazepam] Other (See Comments)    Note: tolerates midazolam fine Face & Throat Swelling.   Corticosteroids Other (See Comments)    Other reaction(s): Other (see comments) Psychotic behaviour     Erythromycin     Other reaction(s): Other (See Comments) Severe stomach pain    Prednisone Other (See Comments)    Anxiety & Nervous Breakdown.   Savella  [Milnacipran]    Prednisolone Anxiety   Prior to  Admission medications   Medication Sig Start Date End Date Taking? Authorizing Provider  buPROPion (WELLBUTRIN XL) 150 MG 24 hr tablet TAKE 1 TABLET DAILY 07/11/22  Yes White, Brian A, NP  busPIRone (BUSPAR) 15 MG tablet Take 1 tablet (15 mg total) by mouth 3 (three)  times daily. 04/05/22  Yes Elwanda [place], NP  citalopram (CELEXA) 40 MG tablet TAKE 1 TABLET DAILY 07/11/22  Yes Lesle Chris A, NP  cyanocobalamin 1000 MCG tablet Take 1 tablet (1,000 mcg total) by mouth daily. 07/26/22  Yes Cherene Altes, MD  dicyclomine (BENTYL) 20 MG tablet Take 1 tablet (20 mg total) by mouth in the morning, at noon, in the evening, and at bedtime. 05/08/22  Yes Aline August, MD  enoxaparin (LOVENOX) 100 MG/ML injection Inject 1 mL (100 mg total) into the skin 2 (two) times daily. Patient taking differently: Inject 100 mg into the skin every 12 (twelve) hours. 06/24/22  Yes Eugenie Filler, MD  eszopiclone (LUNESTA) 2 MG TABS tablet TAKE 1 TABLET(2 MG) BY MOUTH AT BEDTIME AS NEEDED FOR SLEEP Patient taking differently: Take 2 mg by mouth at bedtime. 06/07/22  Yes White, Aaron Edelman A, NP  famotidine (PEPCID) 20 MG tablet TAKE 2 TABLETS TWICE A DAY Patient taking differently: Take 40 mg by mouth 2 (two) times daily. 03/01/22  Yes Volanda Napoleon, MD  ferrous sulfate 325 (65 FE) MG tablet Take 1 tablet (325 mg total) by mouth daily. 06/03/22 06/03/23 Yes Shah, Pratik D, DO  hydrocortisone (ANUSOL-HC) 2.5 % rectal cream Place rectally 4 (four) times daily as needed for hemorrhoids or anal itching. Use 4 times daily x5 days, then 4 times daily as needed. 06/26/22  Yes Eugenie Filler, MD  loperamide (IMODIUM) 2 MG capsule Take 2 capsules (4 mg total) by mouth 4 (four) times daily as needed for diarrhea or loose stools. Patient taking differently: Take 4 mg by mouth in the morning, at noon, in the evening, and at bedtime. 05/08/22  Yes Aline August, MD  meclizine (ANTIVERT) 25 MG tablet TAKE 1 TABLET(25 MG) BY MOUTH THREE  TIMES DAILY AS NEEDED FOR DIZZINESS Patient taking differently: Take by mouth 3 (three) times daily as needed for dizziness. 07/19/22  Yes [name], MD  ondansetron (ZOFRAN) 8 MG tablet Take 8 mg by mouth every 8 (eight) hours as needed for nausea. 03/03/21  Yes [provider]  orphenadrine (NORFLEX) 100 MG tablet TAKE 1 TABLET(100 MG) BY MOUTH AT BEDTIME AS NEEDED FOR MUSCLE SPASMS 06/06/22  Yes Ennever, Rudell Cobb, MD  oxyCODONE (ROXICODONE) 5 MG immediate release tablet Take 1 tablet (5 mg total) by mouth every 4 (four) hours as needed for severe pain or breakthrough pain. 06/04/22  Yes Shah, Pratik D, DO  potassium chloride (KLOR-CON M) 10 MEQ tablet Take 2 tablets (20 mEq total) by mouth daily. 07/07/22  Yes [name], MD  pramoxine-hydrocortisone (PROCTOCREAM-HC) 1-1 % rectal cream Place 1 Application rectally 2 (two) times daily. 07/04/22  Yes Ennever, Rudell Cobb, MD  promethazine (PHENERGAN) 12.5 MG tablet Take 1 tablet (12.5 mg total) by mouth 2 (two) times daily as needed. Patient taking differently: Take 12.5 mg by mouth at bedtime. 04/21/22  Yes [name], MD  propranolol (INDERAL) 20 MG tablet Take 1 tablet (20 mg total) by mouth 2 (two) times daily. Patient taking differently: Take 20 mg by mouth 2 (two) times daily. For tremors 04/21/22  Yes [name], MD  diphenoxylate-atropine (LOMOTIL) 2.5-0.025 MG tablet TAKE 2 TABLETS IN THE MORNING, AT NOON, IN THE EVENING AND AT BEDTIME Patient not taking: Reported on 08/04/2022 04/22/22   Volanda Napoleon, MD   Social History   Socioeconomic History   Marital status: Married    Spouse name: Not on file  Number of children: 3   Years of education: Not on file   Highest education level: Some college, no degree  Occupational History   Occupation: disability  Tobacco Use   Smoking status: Never   Smokeless tobacco: Never  Vaping Use   Vaping Use: Never used  Substance and Sexual Activity   Alcohol use: No    Drug use: Never   Sexual activity: Yes    Birth control/protection: None    Comment: hysterectomy  Other Topics Concern   Not on file  Social History Narrative   Right handed   One story home   Drinks occasional caffeine   Social Determinants of Health   Financial Resource Strain: Not on file  Food Insecurity: Not on file  Transportation Needs: Not on file  Physical Activity: Not on file  Stress: Not on file  Social Connections: Not on file  Intimate Partner Violence: Not on file    Observations/Objective: Vitals:   08/04/22 1000  Weight: 220 lb (99.8 kg)  Height: '5\' 6"'$  (1.676 m)  Nontoxic appearance on video, euthymic mood.  Equal facial movements.  No respiratory distress.  Speaking in full sentences without distress.  All questions answered with understanding of plan expressed.  Assessment and Plan: Metabolic encephalopathy E. coli urinary tract infection - Plan: Basic metabolic panel, CBC with Differential/Platelet AKI (acute kidney injury) ([place]) - Plan: Basic metabolic panel  -Resolved after treatment of UTI.  Check updated labs.  Including renal function.  RTC/ER precautions given.  B12 deficiency  -Now on supplementation, plan on recheck levels next 6 weeks.  Hypokalemia Chronic diarrhea  -Repleted during hospitalization with normal magnesium.  Check updated levels with labs above.  Essential hypertension  -Uncontrolled, may need to start amlodipine in hospital.  On readings recommended with MyChart update with med adjustments as needed.  RTC precautions.  Also recommended discussing headache with neurologist if persistent when normotensive.  RTC/ER precautions.  History of fall - Plan: CBC with Differential/Platelet  -Improved, metabolic encephalopathy as above.  Fall precautions, maintain hydration.  Follow Up Instructions: Patient Instructions  Glad to hear the urinary symptoms have improved.  Continue B12 supplement.  Keep a record of your blood  pressures outside of the office and send me those readings.if elevated, that may be contributing to headache. If not, then I recommend discussing headache with neurology. If blood pressure is elevated, we may add in amlodipine again.  Lab visit in next week.  Take care and let me know if there are questions.     I discussed the assessment and treatment plan with the patient. The patient was provided an opportunity to ask questions and all were answered. The patient agreed with the plan and demonstrated an understanding of the instructions.   The patient was advised to call back or seek an in-person evaluation if the symptoms worsen or if the condition fails to improve as anticipated.   [name], MD

## 2022-08-04 NOTE — Patient Instructions (Addendum)
Glad to hear the urinary symptoms have improved.  Continue B12 supplement.  Keep a record of your blood pressures outside of the office and send me those readings.if elevated, that may be contributing to headache. If not, then I recommend discussing headache with neurology. If blood pressure is elevated, we may add in amlodipine again.  Lab visit in next week.  Take care and let me know if there are questions.

## 2022-08-07 ENCOUNTER — Encounter: Payer: Self-pay | Admitting: Family Medicine

## 2022-08-07 DIAGNOSIS — I10 Essential (primary) hypertension: Secondary | ICD-10-CM

## 2022-08-08 ENCOUNTER — Other Ambulatory Visit (HOSPITAL_COMMUNITY): Payer: Self-pay

## 2022-08-08 NOTE — Telephone Encounter (Signed)
Patient has responded to your request to see blood pressure readings.

## 2022-08-09 ENCOUNTER — Encounter: Payer: Self-pay | Admitting: Family Medicine

## 2022-08-09 ENCOUNTER — Other Ambulatory Visit (HOSPITAL_COMMUNITY): Payer: Self-pay

## 2022-08-09 ENCOUNTER — Telehealth: Payer: Self-pay | Admitting: Family Medicine

## 2022-08-09 MED ORDER — AMLODIPINE BESYLATE 5 MG PO TABS: 5.0000 mg | ORAL_TABLET | Freq: Every day | ORAL | 1 refills | Status: DC

## 2022-08-09 NOTE — Telephone Encounter (Signed)
I received more forms from [facility], not sure if its a duplicate or not. Placed in front bin with charge sheet.

## 2022-08-09 NOTE — Telephone Encounter (Signed)
Received forms from [facility]. Placed in front bin with charge sheet.

## 2022-08-09 NOTE — Telephone Encounter (Signed)
Received fax from front put in Dr. Vonna Kotyk folder to be signed to be faxed back

## 2022-08-10 ENCOUNTER — Telehealth: Payer: Self-pay | Admitting: Lab

## 2022-08-10 NOTE — Telephone Encounter (Signed)
Faxed over home health certification and plan of care to [facility] at [phone] on 08/10/2022

## 2022-08-11 ENCOUNTER — Encounter: Payer: Self-pay | Admitting: Hematology & Oncology

## 2022-08-26 NOTE — Telephone Encounter (Signed)
No notes required.

## 2022-08-29 NOTE — Progress Notes (Signed)
NEUROLOGY FOLLOW UP OFFICE NOTE  Wendy Barber [id number]  Assessment/Plan:   Dizziness - characteristics of BPPV as well as orthostasis Burning mouth syndrome Cerebral meningioma   1 For treatment of burning mouth, patient willing to retry gabapentin.  Will start low dose '100mg'$  at bedtime and we can increase dose in 2 weeks if needed.  If she has side effects, consider changing to lamotrigine 2  Once blood pressure is stabilized and she continues to have vertigo, will then refer to vestibular rehab 3  Follow up with PCP regarding blood pressure 4  Follow up in 4 to 5 months.  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  UPDATE: Repeat MRI of brain with and without contrast on 06/07/2022 showed a 2.0 x 1.6 cm meningioma along the posterior aspect of the falx cerebrum and left cerebellar tentorium abutting the straight sinus, left transverse sinus and confluences of sinuses there there may be mild invasion of the sinuses, stable compared to prior imaging from 2021.  Also pituitary gland had an asymmetric appearance.  Follow up MRI of pituitary gland with and without contrast on 07/29/2022 showed a 10 mm adenoma in the left side of the pituitary gland.  She was referred to endocrinology but she has not been able to get an appointment.    She was admitted to the hospital on 06/13/2022 for PE and DVT.  Started on Lovenox.  Admitted to the hospital again on 1/94/1740 for metabolic encephalopathy due to E. Coli UTI and B12 deficiency of 191.   She has been having fluctuating blood pressure. It can be very elevated or sometimes very low.  Today it is low.    For prior nerve pain, she has taken duloxetine, gabapentin and pregablin and had side  effects.    HISTORY: Cognitive Deficits: Reports onset of short-term memory problems since 2005 following an 18 hour surgery under anesthesia for disseminated adeno mucinosis appendix stage IV.  She was diagnosed with autoimmune hepatitis in January 2021 and was started azathioprine.  She was admitted to [facility] on [facility]/21/2019 for 2 week duration of increased confusion, forgetfulness, lethargy and increased tremors.  She was noted to be jaundiced.  CT head in ED showed 13 mm meningioma along the mildline tentorium.  Ammonia level was 50, which subsequently rose to 98, elevated liver enzymes AST 146, ALT 74 and t bili of 5.8.  Electrolytes normal.  Glucose 137.  Kidney function okay.  HIV negative.  CBC showed anemia (HGB 10.3/HCT31.1/MCV 97.5) but normal WBC 6.5.  UA positive for nitrite but negative for leukocytes and rare bacteria.  COVID testing was negative.  Lactulose was increased.  Liver biopsy was performed which later showed stage 3/4 fibrosis but no cirrhosis. She recollection of most of her hospital stay but still has trouble with some recall from that time.  Neuropsychological evaluation in June 2021 was unremarkable.  Depression and anxiety likely contributing.  Subtle cognitive difficulties following general anesthesia and acute delirium could not be ruled out..    Also endorsed 20+ year history of recurrent syncope.  Sleep deprived EEG on 02/26/2020 was normal.  MRI of brain with and without contrast on 04/06/2020 revealed 1.8 cm meningioma along the tentorium either invading or severely compressing the straight sinus which remained patent.  She was referred to neurosurgery who recommended monitoring but no intervention.     Since 2022, she has experienced numbness on the distal half of her tongue, bottom inside lip and roof of mouth.  It also hurts if she eats anything spicy or crunchy, brush teeth or water falling in her mouth.  It is persistent.  No film or thrush over tongue or in  the mouth.  No rash on the tongue or gums except for one time several months ago she saw some whiteness on her tongue.  But that resolved.  She has a crooked bottom tooth that hurts.  It has gotten worse over the year.  Vit D level was low but otherwise labs (including B12, folate, TSH, Sjogren's antibody, Hgb A1c 6.8) were unremarkable.  For 6 months, reports dizziness described as spinning sensation upon standing, sitting or laying down.  CT head on 12/01/2021 personally reviewed showed stable posterior fossa meningioma.  It has progressively gotten worse.  Has not been found to be secondary to any of her medications.  She was diagnosed with left otitis media.   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/19/2020   Chronic fatigue syndrome    Diabetes mellitus without complication (HCC)    DVT of deep femoral vein, left ([place]) 03/19/2020   Fibromyalgia    Goals of care, counseling/discussion 03/19/2020   Hypertension    Iron deficiency anemia due to chronic blood loss 05/14/2021   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 90 tablet 1   buPROPion (WELLBUTRIN XL) 150 MG 24 hr tablet TAKE 1 TABLET DAILY 90 tablet 0   busPIRone (BUSPAR) 15 MG tablet Take 1 tablet (15 mg total) by mouth 3 (three) times daily. 270 tablet 3   citalopram (CELEXA) 40 MG tablet TAKE 1 TABLET DAILY 90 tablet 0    cyanocobalamin 1000 MCG tablet Take 1 tablet (1,000 mcg total) by mouth daily.     dicyclomine (BENTYL) 20 MG tablet Take 1 tablet (20 mg total) by mouth in the morning, at noon, in the evening, and at bedtime.     diphenoxylate-atropine (LOMOTIL) 2.5-0.025 MG tablet TAKE 2 TABLETS IN THE MORNING, AT NOON, IN THE EVENING AND AT BEDTIME (Patient not taking: Reported on 08/04/2022) 240 tablet 3   enoxaparin (LOVENOX) 100 MG/ML injection Inject 1 mL (100 mg total) into the skin 2 (two) times daily. (Patient taking differently: Inject 100 mg into the skin every 12 (twelve) hours.) 60 mL 6   eszopiclone (LUNESTA) 2 MG TABS tablet TAKE 1 TABLET(2 MG) BY MOUTH AT BEDTIME AS NEEDED FOR SLEEP (Patient taking differently: Take 2 mg by mouth at bedtime.) 30 tablet 2   famotidine (PEPCID) 20 MG tablet TAKE 2 TABLETS TWICE A DAY (Patient taking differently: Take 40 mg by mouth 2 (two) times daily.) 360 tablet 3   ferrous sulfate  325 (65 FE) MG tablet Take 1 tablet (325 mg total) by mouth daily. 30 tablet 3   hydrocortisone (ANUSOL-HC) 2.5 % rectal cream Place rectally 4 (four) times daily as needed for hemorrhoids or anal itching. Use 4 times daily x5 days, then 4 times daily as needed. 30 g 1   loperamide (IMODIUM) 2 MG capsule Take 2 capsules (4 mg total) by mouth 4 (four) times daily as needed for diarrhea or loose stools. (Patient taking differently: Take 4 mg by mouth in the morning, at noon, in the evening, and at bedtime.)     meclizine (ANTIVERT) 25 MG tablet TAKE 1 TABLET(25 MG) BY MOUTH THREE TIMES DAILY AS NEEDED FOR DIZZINESS (Patient taking differently: Take by mouth 3 (three) times daily as needed for dizziness.) 30 tablet 0   ondansetron (ZOFRAN) 8 MG tablet Take 8 mg by mouth every 8 (eight) hours as needed for nausea.     orphenadrine (NORFLEX) 100 MG tablet TAKE 1 TABLET(100 MG) BY MOUTH AT BEDTIME AS NEEDED FOR MUSCLE SPASMS 30 tablet 2   oxyCODONE (ROXICODONE) 5 MG immediate release tablet Take 1  tablet (5 mg total) by mouth every 4 (four) hours as needed for severe pain or breakthrough pain. 20 tablet 0   potassium chloride (KLOR-CON M) 10 MEQ tablet Take 2 tablets (20 mEq total) by mouth daily. 60 tablet 1   pramoxine-hydrocortisone (PROCTOCREAM-HC) 1-1 % rectal cream Place 1 Application rectally 2 (two) times daily. 30 g 3   promethazine (PHENERGAN) 12.5 MG tablet Take 1 tablet (12.5 mg total) by mouth 2 (two) times daily as needed. (Patient taking differently: Take 12.5 mg by mouth at bedtime.) 30 tablet 3   propranolol (INDERAL) 20 MG tablet Take 1 tablet (20 mg total) by mouth 2 (two) times daily. (Patient taking differently: Take 20 mg by mouth 2 (two) times daily. For tremors) 180 tablet 1   No current facility-administered medications on file prior to visit.    ALLERGIES: Allergies  Allergen Reactions   Penicillins Shortness Of Breath    Other reaction(s): Irregular Heart Rate, Other (See Comments) Rapid heartrate    Alprazolam Hives and Other (See Comments)    Hard to arouse, unresponsiveness   Ativan [Lorazepam] Other (See Comments)    Note: tolerates midazolam fine Face & Throat Swelling.   Corticosteroids Other (See Comments)    Other reaction(s): Other (see comments) Psychotic behaviour     Erythromycin     Other reaction(s): Other (See Comments) Severe stomach pain    Prednisone Other (See Comments)    Anxiety & Nervous Breakdown.   Savella  [Milnacipran]    Prednisolone Anxiety    FAMILY HISTORY: Family History  Problem Relation Age of Onset   Depression Mother    Drug abuse Sister    Suicidality Sister    Alcohol abuse Brother    Drug abuse Brother       Objective:  Blood pressure (!) 89/69, pulse 74, height '5\' 6"'$  (1.676 m), weight 225 lb (102.1 kg), SpO2 92 %. General: No acute distress.  Patient appears well-groomed.   Head:  Normocephalic/atraumatic Eyes:  Fundi examined but not visualized Neck: supple, no paraspinal tenderness, full  range of motion Heart:  Regular rate and rhythm Neurological Exam: alert and oriented to person, place, and time.  Speech fluent and not dysarthric, language intact.  CN II-XII intact. Bulk and tone normal, muscle strength 5/5 throughout.  Sensation to light touch intact.  Deep tendon reflexes 2+ throughout, toes  downgoing.  Finger to nose testing intact.  Broad-based cautious gait.  Romberg with mild sway.   Metta Clines, DO  CC: Merri Ray, MD

## 2022-08-30 ENCOUNTER — Ambulatory Visit (INDEPENDENT_AMBULATORY_CARE_PROVIDER_SITE_OTHER): Payer: Medicare Other | Admitting: Neurology

## 2022-08-30 ENCOUNTER — Encounter: Payer: Self-pay | Admitting: Neurology

## 2022-08-30 VITALS — BP 100/60 | HR 74 | Ht 66.0 in | Wt 225.0 lb

## 2022-08-30 DIAGNOSIS — R42 Dizziness and giddiness: Secondary | ICD-10-CM

## 2022-08-30 DIAGNOSIS — K146 Glossodynia: Secondary | ICD-10-CM | POA: Diagnosis not present

## 2022-08-30 DIAGNOSIS — I2699 Other pulmonary embolism without acute cor pulmonale: Secondary | ICD-10-CM | POA: Diagnosis not present

## 2022-08-30 DIAGNOSIS — I82412 Acute embolism and thrombosis of left femoral vein: Secondary | ICD-10-CM

## 2022-08-30 DIAGNOSIS — D352 Benign neoplasm of pituitary gland: Secondary | ICD-10-CM

## 2022-08-30 MED ORDER — GABAPENTIN 100 MG PO CAPS: 100.0000 mg | ORAL_CAPSULE | Freq: Every day | ORAL | 5 refills | Status: DC

## 2022-08-30 NOTE — Patient Instructions (Signed)
Start gabapentin '100mg'$  at bedtime.  If no improvement in pain in 2 weeks contact me Follow up with endocrinology  Follow up with Dr. Carlota Raspberry regarding blood pressure and bump on back of head Follow up with me in 4-5 months.

## 2022-09-06 ENCOUNTER — Other Ambulatory Visit: Payer: Self-pay | Admitting: Family Medicine

## 2022-09-06 ENCOUNTER — Other Ambulatory Visit: Payer: Self-pay | Admitting: Behavioral Health

## 2022-09-06 DIAGNOSIS — F5105 Insomnia due to other mental disorder: Secondary | ICD-10-CM

## 2022-09-06 DIAGNOSIS — H8112 Benign paroxysmal vertigo, left ear: Secondary | ICD-10-CM

## 2022-09-06 NOTE — Telephone Encounter (Signed)
Due 11/28

## 2022-09-12 ENCOUNTER — Other Ambulatory Visit (INDEPENDENT_AMBULATORY_CARE_PROVIDER_SITE_OTHER): Payer: Medicare Other

## 2022-09-12 ENCOUNTER — Other Ambulatory Visit (HOSPITAL_COMMUNITY): Payer: Self-pay

## 2022-09-12 DIAGNOSIS — N179 Acute kidney failure, unspecified: Secondary | ICD-10-CM

## 2022-09-12 DIAGNOSIS — N390 Urinary tract infection, site not specified: Secondary | ICD-10-CM | POA: Diagnosis not present

## 2022-09-12 DIAGNOSIS — B9620 Unspecified Escherichia coli [E. coli] as the cause of diseases classified elsewhere: Secondary | ICD-10-CM

## 2022-09-12 DIAGNOSIS — Z9181 History of falling: Secondary | ICD-10-CM | POA: Diagnosis not present

## 2022-09-12 LAB — CBC WITH DIFFERENTIAL/PLATELET
Basophils Absolute: 0.1 10*3/uL (ref 0.0–0.1)
Basophils Relative: 0.9 % (ref 0.0–3.0)
Eosinophils Absolute: 0.3 10*3/uL (ref 0.0–0.7)
Eosinophils Relative: 3.7 % (ref 0.0–5.0)
HCT: 39.7 % (ref 36.0–46.0)
Hemoglobin: 13 g/dL (ref 12.0–15.0)
Lymphocytes Relative: 50.4 % — ABNORMAL HIGH (ref 12.0–46.0)
Lymphs Abs: 3.5 10*3/uL (ref 0.7–4.0)
MCHC: 32.7 g/dL (ref 30.0–36.0)
MCV: 92.7 fl (ref 78.0–100.0)
Monocytes Absolute: 0.7 10*3/uL (ref 0.1–1.0)
Monocytes Relative: 9.6 % (ref 3.0–12.0)
Neutro Abs: 2.5 10*3/uL (ref 1.4–7.7)
Neutrophils Relative %: 35.4 % — ABNORMAL LOW (ref 43.0–77.0)
Platelets: 354 10*3/uL (ref 150.0–400.0)
RBC: 4.28 Mil/uL (ref 3.87–5.11)
RDW: 15.3 % (ref 11.5–15.5)
WBC: 7 10*3/uL (ref 4.0–10.5)

## 2022-09-12 LAB — BASIC METABOLIC PANEL
BUN: 20 mg/dL (ref 6–23)
CO2: 28 mEq/L (ref 19–32)
Calcium: 9.4 mg/dL (ref 8.4–10.5)
Chloride: 103 mEq/L (ref 96–112)
Creatinine, Ser: 1.01 mg/dL (ref 0.40–1.20)
GFR: 60.78 mL/min (ref >60.00)
Glucose, Bld: 147 mg/dL — ABNORMAL HIGH (ref 70–99)
Potassium: 4.1 mEq/L (ref 3.5–5.1)
Sodium: 140 mEq/L (ref 135–145)

## 2022-09-13 ENCOUNTER — Other Ambulatory Visit (HOSPITAL_COMMUNITY): Payer: Self-pay

## 2022-09-14 ENCOUNTER — Encounter: Payer: Self-pay | Admitting: Hematology & Oncology

## 2022-09-21 ENCOUNTER — Ambulatory Visit (INDEPENDENT_AMBULATORY_CARE_PROVIDER_SITE_OTHER): Payer: Medicare Other | Admitting: Behavioral Health

## 2022-09-21 ENCOUNTER — Encounter: Payer: Self-pay | Admitting: Hematology & Oncology

## 2022-09-21 ENCOUNTER — Encounter: Payer: Self-pay | Admitting: Behavioral Health

## 2022-09-21 DIAGNOSIS — F411 Generalized anxiety disorder: Secondary | ICD-10-CM

## 2022-09-21 DIAGNOSIS — F5105 Insomnia due to other mental disorder: Secondary | ICD-10-CM | POA: Diagnosis not present

## 2022-09-21 DIAGNOSIS — F331 Major depressive disorder, recurrent, moderate: Secondary | ICD-10-CM | POA: Diagnosis not present

## 2022-09-21 DIAGNOSIS — F99 Mental disorder, not otherwise specified: Secondary | ICD-10-CM

## 2022-09-21 DIAGNOSIS — F418 Other specified anxiety disorders: Secondary | ICD-10-CM

## 2022-09-21 MED ORDER — CITALOPRAM HYDROBROMIDE 40 MG PO TABS: 40.0000 mg | ORAL_TABLET | Freq: Every day | ORAL | 1 refills | Status: DC

## 2022-09-21 MED ORDER — BUPROPION HCL ER (XL) 300 MG PO TB24: 300.0000 mg | ORAL_TABLET | Freq: Every day | ORAL | 1 refills | Status: DC

## 2022-09-21 MED ORDER — ESZOPICLONE 2 MG PO TABS: ORAL_TABLET | ORAL | 3 refills | Status: DC

## 2022-09-21 NOTE — Progress Notes (Signed)
[facility]  Patient ID: Wendy Barber,  MRN: [id number]  PCP: [name], MD  Date of Evaluation: 09/21/2022 Time spent:30 minutes  Chief Complaint:  Chief Complaint   Follow-up; Medication Problem; Patient Education; Medication Refill; Depression; Anxiety     HISTORY/CURRENT STATUS: HPI  61 year old female presents to this office  for follow up and medication management.  She looks tired and flat. She says she has had 4 hospitalizations over the summer one for surgery to move extensive blood clots.  She says that she has grown more derpessed, lethargic, and lack of motivation to do much of anything. She understands that her complex health issues have complicated her mental health. She is requesting appropriate adjustment to her psychiatric medications if it would help further.  Reports her anxiety today at 4/10 and depression at 7/10. She is sleeping 6 hours per night. Her family remains highly supportive. She denies any mania, no psychosis, No current SI or HI.     No prior psychiatric medications   Individual Medical History/ Review of Systems: Changes? :No   Allergies: Penicillins, Alprazolam, Ativan [lorazepam], Corticosteroids, Erythromycin, Prednisone, Savella  [milnacipran], and Prednisolone  Current Medications:  Current Outpatient Medications:    buPROPion (WELLBUTRIN XL) 300 MG 24 hr tablet, Take 1 tablet (300 mg total) by mouth daily., Disp: 30 tablet, Rfl: 1   amLODipine (NORVASC) 5 MG tablet, Take 1 tablet (5 mg total) by mouth daily., Disp: 90 tablet, Rfl: 1   buPROPion (WELLBUTRIN XL) 150 MG 24 hr tablet, TAKE 1 TABLET DAILY, Disp: 90 tablet, Rfl: 0   busPIRone (BUSPAR) 15 MG tablet, Take 1 tablet (15 mg total) by mouth 3 (three) times daily., Disp: 270 tablet, Rfl: 3   citalopram (CELEXA) 40 MG tablet, Take 1 tablet (40 mg total) by mouth daily., Disp: 90 tablet, Rfl: 1   cyanocobalamin 1000 MCG tablet, Take 1 tablet (1,000 mcg total) by  mouth daily., Disp: , Rfl:    dicyclomine (BENTYL) 20 MG tablet, Take 1 tablet (20 mg total) by mouth in the morning, at noon, in the evening, and at bedtime., Disp: , Rfl:    diphenoxylate-atropine (LOMOTIL) 2.5-0.025 MG tablet, TAKE 2 TABLETS IN THE MORNING, AT NOON, IN THE EVENING AND AT BEDTIME, Disp: 240 tablet, Rfl: 3   enoxaparin (LOVENOX) 100 MG/ML injection, Inject 1 mL (100 mg total) into the skin 2 (two) times daily. (Patient taking differently: Inject 100 mg into the skin every 12 (twelve) hours.), Disp: 60 mL, Rfl: 6   eszopiclone (LUNESTA) 2 MG TABS tablet, TAKE 1 TABLET(2 MG) BY MOUTH AT BEDTIME AS NEEDED FOR SLEEP, Disp: 30 tablet, Rfl: 3   famotidine (PEPCID) 20 MG tablet, TAKE 2 TABLETS TWICE A DAY (Patient taking differently: Take 40 mg by mouth 2 (two) times daily.), Disp: 360 tablet, Rfl: 3   ferrous sulfate 325 (65 FE) MG tablet, Take 1 tablet (325 mg total) by mouth daily., Disp: 30 tablet, Rfl: 3   gabapentin (NEURONTIN) 100 MG capsule, Take 1 capsule (100 mg total) by mouth at bedtime., Disp: 30 capsule, Rfl: 5   hydrocortisone (ANUSOL-HC) 2.5 % rectal cream, Place rectally 4 (four) times daily as needed for hemorrhoids or anal itching. Use 4 times daily x5 days, then 4 times daily as needed., Disp: 30 g, Rfl: 1   loperamide (IMODIUM) 2 MG capsule, Take 2 capsules (4 mg total) by mouth 4 (four) times daily as needed for diarrhea or loose stools. (Patient taking differently: Take  4 mg by mouth in the morning, at noon, in the evening, and at bedtime.), Disp: , Rfl:    meclizine (ANTIVERT) 25 MG tablet, TAKE 1 TABLET(25 MG) BY MOUTH THREE TIMES DAILY AS NEEDED FOR DIZZINESS, Disp: 30 tablet, Rfl: 0   ondansetron (ZOFRAN) 8 MG tablet, Take 8 mg by mouth every 8 (eight) hours as needed for nausea., Disp: , Rfl:    orphenadrine (NORFLEX) 100 MG tablet, TAKE 1 TABLET(100 MG) BY MOUTH AT BEDTIME AS NEEDED FOR MUSCLE SPASMS, Disp: 30 tablet, Rfl: 2   oxyCODONE (ROXICODONE) 5 MG immediate  release tablet, Take 1 tablet (5 mg total) by mouth every 4 (four) hours as needed for severe pain or breakthrough pain., Disp: 20 tablet, Rfl: 0   potassium chloride (KLOR-CON M) 10 MEQ tablet, Take 2 tablets (20 mEq total) by mouth daily., Disp: 60 tablet, Rfl: 1   pramoxine-hydrocortisone (PROCTOCREAM-HC) 1-1 % rectal cream, Place 1 Application rectally 2 (two) times daily., Disp: 30 g, Rfl: 3   promethazine (PHENERGAN) 12.5 MG tablet, Take 1 tablet (12.5 mg total) by mouth 2 (two) times daily as needed. (Patient taking differently: Take 12.5 mg by mouth at bedtime.), Disp: 30 tablet, Rfl: 3   propranolol (INDERAL) 20 MG tablet, Take 1 tablet (20 mg total) by mouth 2 (two) times daily. (Patient taking differently: Take 20 mg by mouth 2 (two) times daily. For tremors), Disp: 180 tablet, Rfl: 1 Medication Side Effects: none  Family Medical/ Social History: Changes? No  MENTAL HEALTH EXAM:  There were no vitals taken for this visit.There is no height or weight on file to calculate BMI.  General Appearance: Casual, Neat, and Well Groomed  Eye Contact:  Good  Speech:  Clear and Coherent  Volume:  Normal  Mood:  Depressed  Affect:  Appropriate and Depressed  Thought Process:  Coherent  Orientation:  Full (Time, Place, and Person)  Thought Content: Logical   Suicidal Thoughts:  No  Homicidal Thoughts:  No  Memory:  WNL  Judgement:  Good  Insight:  Good  Psychomotor Activity:  Normal  Concentration:  Concentration: Good  Recall:  Good  Fund of Knowledge: Good  Language: Good  Assets:  Desire for Improvement  ADL's:  Intact  Cognition: WNL  Prognosis:  Good    DIAGNOSES:    ICD-10-CM   1. Generalized anxiety disorder  F41.1 buPROPion (WELLBUTRIN XL) 300 MG 24 hr tablet    citalopram (CELEXA) 40 MG tablet    2. Major depressive disorder, recurrent episode, moderate (HCC)  F33.1 buPROPion (WELLBUTRIN XL) 300 MG 24 hr tablet    citalopram (CELEXA) 40 MG tablet    3. Depression  with anxiety  F41.8     4. Insomnia due to other mental disorder  F51.05 eszopiclone (LUNESTA) 2 MG TABS tablet   F99       Receiving Psychotherapy: No    RECOMMENDATIONS:   Greater than 50% of 30 min visit with patient was spent on counseling and coordination of care.  We discussed her current level of stability with anxiety and depression. She has had four recent hospitalizations over the summer with very complex health issues. Due to health issues she is worn mentally and physically complicating her treatment. We discussed medication and did decided to adjust to if it would help. We agreed to:  To increase Wellbutrin to 300 mg XR daily Continue Buspar  to 15 mg to  three times daily as needed. Continue Celexa to 40 mg daily.  Will take with food to help with nausea. Could make diarrhea worse in the beginning but may pass after a week or two.  To Continue Lunesta 5 mg at betime. Belsomra was not approved by insurance. She is happy with Johnnye Sima but says causes metallic taste in mouth next day.  Will report worsening symptoms or side effects promptly To follow up in 6 weeks to reassess Will continue to follow up with PCP regularly Provided emergency contact information Reviewed Long Barn, NP

## 2022-09-23 ENCOUNTER — Other Ambulatory Visit: Payer: Self-pay | Admitting: Hematology & Oncology

## 2022-09-23 DIAGNOSIS — C181 Malignant neoplasm of appendix: Secondary | ICD-10-CM

## 2022-09-26 ENCOUNTER — Ambulatory Visit (HOSPITAL_BASED_OUTPATIENT_CLINIC_OR_DEPARTMENT_OTHER): Admission: RE | Admit: 2022-09-26 | Payer: Medicare Other | Source: Ambulatory Visit

## 2022-09-28 ENCOUNTER — Other Ambulatory Visit: Payer: Self-pay | Admitting: Behavioral Health

## 2022-09-28 DIAGNOSIS — F411 Generalized anxiety disorder: Secondary | ICD-10-CM

## 2022-09-28 DIAGNOSIS — F331 Major depressive disorder, recurrent, moderate: Secondary | ICD-10-CM

## 2022-10-04 ENCOUNTER — Other Ambulatory Visit: Payer: Self-pay | Admitting: Family Medicine

## 2022-10-04 DIAGNOSIS — H8112 Benign paroxysmal vertigo, left ear: Secondary | ICD-10-CM

## 2022-10-06 ENCOUNTER — Ambulatory Visit (HOSPITAL_BASED_OUTPATIENT_CLINIC_OR_DEPARTMENT_OTHER): Payer: Medicare Other

## 2022-10-07 ENCOUNTER — Ambulatory Visit (HOSPITAL_BASED_OUTPATIENT_CLINIC_OR_DEPARTMENT_OTHER)
Admission: RE | Admit: 2022-10-07 | Discharge: 2022-10-07 | Disposition: A | Discharge status: Home / Self Care | Payer: Medicare Other | Source: Ambulatory Visit | Attending: Hematology & Oncology | Admitting: Hematology & Oncology

## 2022-10-07 ENCOUNTER — Other Ambulatory Visit: Payer: Self-pay | Admitting: Behavioral Health

## 2022-10-07 DIAGNOSIS — C181 Malignant neoplasm of appendix: Secondary | ICD-10-CM | POA: Diagnosis present

## 2022-10-07 DIAGNOSIS — F5105 Insomnia due to other mental disorder: Secondary | ICD-10-CM

## 2022-10-07 DIAGNOSIS — C772 Secondary and unspecified malignant neoplasm of intra-abdominal lymph nodes: Secondary | ICD-10-CM | POA: Diagnosis present

## 2022-10-07 MED ORDER — IOHEXOL 300 MG/ML  SOLN
100.0000 mL | Freq: Once | INTRAMUSCULAR | Status: AC | PRN
  Administered 2022-10-07: 100 mL via INTRAVENOUS

## 2022-10-09 ENCOUNTER — Other Ambulatory Visit: Payer: Self-pay | Admitting: Hematology & Oncology

## 2022-10-09 ENCOUNTER — Encounter: Payer: Self-pay | Admitting: Hematology & Oncology

## 2022-10-10 ENCOUNTER — Other Ambulatory Visit: Payer: Self-pay

## 2022-10-10 ENCOUNTER — Inpatient Hospital Stay: Payer: Medicare Other | Attending: Hematology & Oncology

## 2022-10-10 ENCOUNTER — Inpatient Hospital Stay: Payer: Medicare Other

## 2022-10-10 ENCOUNTER — Inpatient Hospital Stay: Payer: Medicare Other | Admitting: Hematology & Oncology

## 2022-10-10 ENCOUNTER — Telehealth: Payer: Self-pay | Admitting: *Deleted

## 2022-10-10 DIAGNOSIS — C786 Secondary malignant neoplasm of retroperitoneum and peritoneum: Secondary | ICD-10-CM

## 2022-10-10 DIAGNOSIS — C181 Malignant neoplasm of appendix: Secondary | ICD-10-CM | POA: Insufficient documentation

## 2022-10-10 DIAGNOSIS — R978 Other abnormal tumor markers: Secondary | ICD-10-CM | POA: Insufficient documentation

## 2022-10-10 DIAGNOSIS — R1901 Right upper quadrant abdominal swelling, mass and lump: Secondary | ICD-10-CM

## 2022-10-10 DIAGNOSIS — Z880 Allergy status to penicillin: Secondary | ICD-10-CM | POA: Insufficient documentation

## 2022-10-10 DIAGNOSIS — R531 Weakness: Secondary | ICD-10-CM | POA: Insufficient documentation

## 2022-10-10 DIAGNOSIS — F32A Depression, unspecified: Secondary | ICD-10-CM | POA: Insufficient documentation

## 2022-10-10 DIAGNOSIS — D510 Vitamin B12 deficiency anemia due to intrinsic factor deficiency: Secondary | ICD-10-CM | POA: Insufficient documentation

## 2022-10-10 DIAGNOSIS — R197 Diarrhea, unspecified: Secondary | ICD-10-CM | POA: Insufficient documentation

## 2022-10-10 DIAGNOSIS — Z79899 Other long term (current) drug therapy: Secondary | ICD-10-CM | POA: Insufficient documentation

## 2022-10-10 DIAGNOSIS — R300 Dysuria: Secondary | ICD-10-CM | POA: Insufficient documentation

## 2022-10-10 DIAGNOSIS — R112 Nausea with vomiting, unspecified: Secondary | ICD-10-CM | POA: Insufficient documentation

## 2022-10-10 DIAGNOSIS — Z7901 Long term (current) use of anticoagulants: Secondary | ICD-10-CM | POA: Insufficient documentation

## 2022-10-10 DIAGNOSIS — R5383 Other fatigue: Secondary | ICD-10-CM | POA: Insufficient documentation

## 2022-10-10 DIAGNOSIS — R109 Unspecified abdominal pain: Secondary | ICD-10-CM | POA: Insufficient documentation

## 2022-10-10 DIAGNOSIS — E860 Dehydration: Secondary | ICD-10-CM | POA: Insufficient documentation

## 2022-10-10 DIAGNOSIS — R42 Dizziness and giddiness: Secondary | ICD-10-CM | POA: Insufficient documentation

## 2022-10-10 DIAGNOSIS — M549 Dorsalgia, unspecified: Secondary | ICD-10-CM | POA: Insufficient documentation

## 2022-10-10 NOTE — Telephone Encounter (Signed)
-----   Message from Wendy Napoleon, MD sent at 10/10/2022  6:05 AM EST ----- Call - no obvious recurrent cancer!!  Merry Christmas!  Wendy Barber

## 2022-10-11 ENCOUNTER — Other Ambulatory Visit: Payer: Self-pay | Admitting: Family Medicine

## 2022-10-11 DIAGNOSIS — H8112 Benign paroxysmal vertigo, left ear: Secondary | ICD-10-CM

## 2022-10-12 ENCOUNTER — Inpatient Hospital Stay: Payer: Medicare Other

## 2022-10-12 ENCOUNTER — Inpatient Hospital Stay (HOSPITAL_BASED_OUTPATIENT_CLINIC_OR_DEPARTMENT_OTHER): Payer: Medicare Other | Admitting: Hematology & Oncology

## 2022-10-12 ENCOUNTER — Other Ambulatory Visit: Payer: Self-pay

## 2022-10-12 ENCOUNTER — Encounter: Payer: Self-pay | Admitting: Hematology & Oncology

## 2022-10-12 ENCOUNTER — Other Ambulatory Visit: Payer: Self-pay | Admitting: *Deleted

## 2022-10-12 ENCOUNTER — Other Ambulatory Visit: Payer: Medicare Other

## 2022-10-12 VITALS — BP 111/67 | HR 58 | Resp 18

## 2022-10-12 VITALS — BP 83/52 | HR 77 | Temp 97.4°F | Resp 20 | Ht 66.0 in | Wt 222.1 lb

## 2022-10-12 DIAGNOSIS — C772 Secondary and unspecified malignant neoplasm of intra-abdominal lymph nodes: Secondary | ICD-10-CM

## 2022-10-12 DIAGNOSIS — K591 Functional diarrhea: Secondary | ICD-10-CM | POA: Diagnosis not present

## 2022-10-12 DIAGNOSIS — Z79899 Other long term (current) drug therapy: Secondary | ICD-10-CM | POA: Diagnosis not present

## 2022-10-12 DIAGNOSIS — R1901 Right upper quadrant abdominal swelling, mass and lump: Secondary | ICD-10-CM

## 2022-10-12 DIAGNOSIS — R300 Dysuria: Secondary | ICD-10-CM | POA: Diagnosis not present

## 2022-10-12 DIAGNOSIS — R197 Diarrhea, unspecified: Secondary | ICD-10-CM | POA: Diagnosis not present

## 2022-10-12 DIAGNOSIS — C181 Malignant neoplasm of appendix: Secondary | ICD-10-CM

## 2022-10-12 DIAGNOSIS — R42 Dizziness and giddiness: Secondary | ICD-10-CM | POA: Diagnosis not present

## 2022-10-12 DIAGNOSIS — C786 Secondary malignant neoplasm of retroperitoneum and peritoneum: Secondary | ICD-10-CM

## 2022-10-12 DIAGNOSIS — R5383 Other fatigue: Secondary | ICD-10-CM | POA: Diagnosis not present

## 2022-10-12 DIAGNOSIS — Z880 Allergy status to penicillin: Secondary | ICD-10-CM | POA: Diagnosis not present

## 2022-10-12 DIAGNOSIS — M549 Dorsalgia, unspecified: Secondary | ICD-10-CM | POA: Diagnosis not present

## 2022-10-12 DIAGNOSIS — R531 Weakness: Secondary | ICD-10-CM | POA: Diagnosis not present

## 2022-10-12 DIAGNOSIS — Z7901 Long term (current) use of anticoagulants: Secondary | ICD-10-CM | POA: Diagnosis not present

## 2022-10-12 DIAGNOSIS — F32A Depression, unspecified: Secondary | ICD-10-CM | POA: Diagnosis not present

## 2022-10-12 DIAGNOSIS — R978 Other abnormal tumor markers: Secondary | ICD-10-CM

## 2022-10-12 DIAGNOSIS — E860 Dehydration: Secondary | ICD-10-CM | POA: Diagnosis not present

## 2022-10-12 DIAGNOSIS — D510 Vitamin B12 deficiency anemia due to intrinsic factor deficiency: Secondary | ICD-10-CM | POA: Diagnosis not present

## 2022-10-12 DIAGNOSIS — R112 Nausea with vomiting, unspecified: Secondary | ICD-10-CM | POA: Diagnosis not present

## 2022-10-12 DIAGNOSIS — R109 Unspecified abdominal pain: Secondary | ICD-10-CM | POA: Diagnosis not present

## 2022-10-12 LAB — C DIFFICILE QUICK SCREEN W PCR REFLEX
C Diff antigen: POSITIVE — AB
C Diff toxin: NEGATIVE

## 2022-10-12 LAB — CBC WITH DIFFERENTIAL (CANCER CENTER ONLY)
Abs Immature Granulocytes: 0.03 10*3/uL (ref 0.00–0.07)
Basophils Absolute: 0.1 10*3/uL (ref 0.0–0.1)
Basophils Relative: 1 %
Eosinophils Absolute: 0.2 10*3/uL (ref 0.0–0.5)
Eosinophils Relative: 3 %
HCT: 43.8 % (ref 36.0–46.0)
Hemoglobin: 14.2 g/dL (ref 12.0–15.0)
Immature Granulocytes: 0 %
Lymphocytes Relative: 41 %
Lymphs Abs: 3 10*3/uL (ref 0.7–4.0)
MCH: 30.3 pg (ref 26.0–34.0)
MCHC: 32.4 g/dL (ref 30.0–36.0)
MCV: 93.6 fL (ref 80.0–100.0)
Monocytes Absolute: 0.9 10*3/uL (ref 0.1–1.0)
Monocytes Relative: 13 %
Neutro Abs: 3 10*3/uL (ref 1.7–7.7)
Neutrophils Relative %: 42 %
Platelet Count: 295 10*3/uL (ref 150–400)
RBC: 4.68 MIL/uL (ref 3.87–5.11)
RDW: 14.7 % (ref 11.5–15.5)
WBC Count: 7.2 10*3/uL (ref 4.0–10.5)
nRBC: 0 % (ref 0.0–0.2)

## 2022-10-12 LAB — CLOSTRIDIUM DIFFICILE BY PCR, REFLEXED: Toxigenic C. Difficile by PCR: POSITIVE — AB

## 2022-10-12 LAB — CMP (CANCER CENTER ONLY)
ALT: 38 U/L (ref 0–44)
AST: 47 U/L — ABNORMAL HIGH (ref 15–41)
Albumin: 4.5 g/dL (ref 3.5–5.0)
Alkaline Phosphatase: 230 U/L — ABNORMAL HIGH (ref 38–126)
Anion gap: 12 (ref 5–15)
BUN: 26 mg/dL — ABNORMAL HIGH (ref 6–20)
CO2: 21 mmol/L — ABNORMAL LOW (ref 22–32)
Calcium: 10 mg/dL (ref 8.9–10.3)
Chloride: 106 mmol/L (ref 98–111)
Creatinine: 1.45 mg/dL — ABNORMAL HIGH (ref 0.44–1.00)
GFR, Estimated: 42 mL/min — ABNORMAL LOW (ref >60)
Glucose, Bld: 162 mg/dL — ABNORMAL HIGH (ref 70–99)
Potassium: 3.7 mmol/L (ref 3.5–5.1)
Sodium: 139 mmol/L (ref 135–145)
Total Bilirubin: 0.6 mg/dL (ref 0.3–1.2)
Total Protein: 8.2 g/dL — ABNORMAL HIGH (ref 6.5–8.1)

## 2022-10-12 MED ORDER — SODIUM CHLORIDE 0.9% FLUSH
10.0000 mL | INTRAVENOUS | Status: DC | PRN
  Administered 2022-10-12: 10 mL via INTRAVENOUS

## 2022-10-12 MED ORDER — SODIUM CHLORIDE 0.9 % IV SOLN: INTRAVENOUS | Status: DC

## 2022-10-12 MED ORDER — HYDROMORPHONE HCL 4 MG PO TABS: 4.0000 mg | ORAL_TABLET | Freq: Four times a day (QID) | ORAL | 0 refills | Status: DC | PRN

## 2022-10-12 MED ORDER — SODIUM CHLORIDE 0.9 % IV SOLN
40.0000 mg | Freq: Once | INTRAVENOUS | Status: AC
  Administered 2022-10-12: 40 mg via INTRAVENOUS
  Filled 2022-10-12: qty 4

## 2022-10-12 MED ORDER — HYDROMORPHONE HCL 1 MG/ML IJ SOLN
1.0000 mg | Freq: Once | INTRAMUSCULAR | Status: DC
  Filled 2022-10-12: qty 1

## 2022-10-12 MED ORDER — SODIUM CHLORIDE 0.9 % IV SOLN: INTRAVENOUS | Status: AC

## 2022-10-12 MED ORDER — PRADAXA 110 MG PO CAPS: 110.0000 mg | ORAL_CAPSULE | Freq: Two times a day (BID) | ORAL | 12 refills | Status: DC

## 2022-10-12 MED ORDER — HEPARIN SOD (PORK) LOCK FLUSH 100 UNIT/ML IV SOLN
500.0000 [IU] | Freq: Once | INTRAVENOUS | Status: AC
  Administered 2022-10-12: 500 [IU] via INTRAVENOUS

## 2022-10-12 MED ORDER — HYDROMORPHONE HCL 1 MG/ML IJ SOLN
1.0000 mg | Freq: Once | INTRAMUSCULAR | Status: AC
  Administered 2022-10-12: 1 mg via INTRAVENOUS

## 2022-10-12 MED ORDER — POTASSIUM CHLORIDE 10 MEQ/100ML IV SOLN
10.0000 meq | INTRAVENOUS | Status: AC
  Administered 2022-10-12 (×2): 10 meq via INTRAVENOUS
  Filled 2022-10-12: qty 100

## 2022-10-12 NOTE — Patient Instructions (Signed)
Dehydration, Adult Dehydration is condition in which there is not enough water or other fluids in the body. This happens when a person loses more fluids than he or she takes in. Important body parts cannot work right without the right amount of fluids. Any loss of fluids from the body can cause dehydration. Dehydration can be mild, worse, or very bad. It should be treated right away to keep it from getting very bad. What are the causes? This condition may be caused by: Conditions that cause loss of water or other fluids, such as: Watery poop (diarrhea). Vomiting. Sweating a lot. Peeing (urinating) a lot. Not drinking enough fluids, especially when you: Are ill. Are doing things that take a lot of energy to do. Other illnesses and conditions, such as fever or infection. Certain medicines, such as medicines that take extra fluid out of the body (diuretics). Lack of safe drinking water. Not being able to get enough water and food. What increases the risk? The following factors may make you more likely to develop this condition: Having a long-term (chronic) illness that has not been treated the right way, such as: Diabetes. Heart disease. Kidney disease. Being 65 years of age or older. Having a disability. Living in a place that is high above the ground or sea (high in altitude). The thinner, dried air causes more fluid loss. Doing exercises that put stress on your body for a long time. What are the signs or symptoms? Symptoms of dehydration depend on how bad it is. Mild or worse dehydration Thirst. Dry lips or dry mouth. Feeling dizzy or light-headed, especially when you stand up from sitting. Muscle cramps. Your body making: Dark pee (urine). Pee may be the color of tea. Less pee than normal. Less tears than normal. Headache. Very bad dehydration Changes in skin. Skin may: Be cold to the touch (clammy). Be blotchy or pale. Not go back to normal right after you lightly pinch  it and let it go. Little or no tears, pee, or sweat. Changes in vital signs, such as: Fast breathing. Low blood pressure. Weak pulse. Pulse that is more than 100 beats a minute when you are sitting still. Other changes, such as: Feeling very thirsty. Eyes that look hollow (sunken). Cold hands and feet. Being mixed up (confused). Being very tired (lethargic) or having trouble waking from sleep. Short-term weight loss. Loss of consciousness. How is this treated? Treatment for this condition depends on how bad it is. Treatment should start right away. Do not wait until your condition gets very bad. Very bad dehydration is an emergency. You will need to go to a hospital. Mild or worse dehydration can be treated at home. You may be asked to: Drink more fluids. Drink an oral rehydration solution (ORS). This drink helps get the right amounts of fluids and salts and minerals in the blood (electrolytes). Very bad dehydration can be treated: With fluids through an IV tube. By getting normal levels of salts and minerals in your blood. This is often done by giving salts and minerals through a tube. The tube is passed through your nose and into your stomach. By treating the root cause. Follow these instructions at home: Oral rehydration solution If told by your doctor, drink an ORS: Make an ORS. Use instructions on the package. Start by drinking small amounts, about  cup (120 mL) every 5-10 minutes. Slowly drink more until you have had the amount that your doctor said to have. Eating and drinking          Drink enough clear fluid to keep your pee pale yellow. If you were told to drink an ORS, finish the ORS first. Then, start slowly drinking other clear fluids. Drink fluids such as: Water. Do not drink only water. Doing that can make the salt (sodium) level in your body get too low. Water from ice chips you suck on. Fruit juice that you have added water to (diluted). Low-calorie sports  drinks. Eat foods that have the right amounts of salts and minerals, such as: Bananas. Oranges. Potatoes. Tomatoes. Spinach. Do not drink alcohol. Avoid: Drinks that have a lot of sugar. These include: High-calorie sports drinks. Fruit juice that you did not add water to. Soda. Caffeine. Foods that are greasy or have a lot of fat or sugar. General instructions Take over-the-counter and prescription medicines only as told by your doctor. Do not take salt tablets. Doing that can make the salt level in your body get too high. Return to your normal activities as told by your doctor. Ask your doctor what activities are safe for you. Keep all follow-up visits as told by your doctor. This is important. Contact a doctor if: You have pain in your belly (abdomen) and the pain: Gets worse. Stays in one place. You have a rash. You have a stiff neck. You get angry or annoyed (irritable) more easily than normal. You are more tired or have a harder time waking than normal. You feel: Weak or dizzy. Very thirsty. Get help right away if you have: Any symptoms of very bad dehydration. Symptoms of vomiting, such as: You cannot eat or drink without vomiting. Your vomiting gets worse or does not go away. Your vomit has blood or green stuff in it. Symptoms that get worse with treatment. A fever. A very bad headache. Problems with peeing or pooping (having a bowel movement), such as: Watery poop that gets worse or does not go away. Blood in your poop (stool). This may cause poop to look black and tarry. Not peeing in 6-8 hours. Peeing only a small amount of very dark pee in 6-8 hours. Trouble breathing. These symptoms may be an emergency. Do not wait to see if the symptoms will go away. Get medical help right away. Call your local emergency services (911 in the [place] Do not drive yourself to the hospital. Summary Dehydration is a condition in which there is not enough water or other fluids  in the body. This happens when a person loses more fluids than he or she takes in. Treatment for this condition depends on how bad it is. Treatment should be started right away. Do not wait until your condition gets very bad. Drink enough clear fluid to keep your pee pale yellow. If you were told to drink an oral rehydration solution (ORS), finish the ORS first. Then, start slowly drinking other clear fluids. Take over-the-counter and prescription medicines only as told by your doctor. Get help right away if you have any symptoms of very bad dehydration. This information is not intended to replace advice given to you by your health care provider. Make sure you discuss any questions you have with your health care provider. Document Revised: 02/16/2022 Document Reviewed: 05/23/2019 Elsevier Patient Education  2023 [employer]  

## 2022-10-12 NOTE — Patient Instructions (Signed)

## 2022-10-12 NOTE — Progress Notes (Signed)
Hematology and Oncology Follow Up Visit  Toshia Larkin [id number] 12-Jul-1961 61 y.o. 10/12/2022   Principle Diagnosis:  History of metastatic appendiceal cancer  -- recurrent  Superior mesenteric vein thrombus Tthromboembolism of the RIGHT leg Pernicious anemia  Current Therapy:   HIPEC - Surgery done in [place] in 11/2020 Lovenox 120 mg sq BID --started on 06/20/2022 -- d/c on 10/12/2022 Pradaxa 110 mg p.o. twice daily Vitamin B12 5000 mcg PO daily      Interim History:  Ms. Junkins is in for follow-up.  She is not doing all that well.  She is having a lot of diarrhea.  She has had nausea and vomiting.  The nausea and vomiting seems to have improved.  She was post go to [place] to see her surgeon today.  However, she just did not feel all that well.  We did go ahead and do a CT scan of her body on 10/07/2022.  The CT scan of the body did not show any evidence of recurrent disease.  She has always had an elevated Chromogranin A.  When we last checked it back in May it was 304.  She is dizzy.  She is weak.  I am sure she is dehydrated.  We are rechecking a C. difficile on her.  She would like to have a colostomy done.  She said that she cannot keep on living with a poor quality of life because she has to go to the bathroom all the time.  She had a colostomy, at least she would not have to go to the bathroom and everything will go to the colostomy bag.  I will have to call one of our surgeons and see if they would consider doing a colostomy on her.  She has had some pain issues.  Again she has had a lot of scar tissue.  I will call in some Dilaudid for her.  This is worked before.  She has had no cough.  She has had no shortness of breath.  There is been no bleeding.  She has had no leg swelling.  She did have a Doppler of her legs back in October.  This did not show any acute thrombus.  She has some residual minimal thrombus in the legs bilaterally.  I think she has had a  thrombectomy in the past..  She really wants to get off the Lovenox.  I will see about putting her on Pradaxa.  Will get her on Pradaxa 110 mg p.o. twice daily.  She has had no urinary difficulties.  I would have to say that overall, her performance status is probably ECOG 1.  She medications:  Current Outpatient Medications:    amLODipine (NORVASC) 5 MG tablet, Take 1 tablet (5 mg total) by mouth daily., Disp: 90 tablet, Rfl: 1   buPROPion (WELLBUTRIN XL) 300 MG 24 hr tablet, Take 1 tablet (300 mg total) by mouth daily., Disp: 30 tablet, Rfl: 1   busPIRone (BUSPAR) 15 MG tablet, Take 1 tablet (15 mg total) by mouth 3 (three) times daily., Disp: 270 tablet, Rfl: 3   citalopram (CELEXA) 40 MG tablet, Take 1 tablet (40 mg total) by mouth daily., Disp: 90 tablet, Rfl: 1   cyanocobalamin 1000 MCG tablet, Take 1 tablet (1,000 mcg total) by mouth daily., Disp: , Rfl:    Dabigatran Etexilate Mesylate (PRADAXA) 110 MG CAPS, Take 110 mg by mouth 2 (two) times daily., Disp: 60 capsule, Rfl: 12   dicyclomine (BENTYL) 20 MG tablet, Take 1  tablet (20 mg total) by mouth in the morning, at noon, in the evening, and at bedtime., Disp: , Rfl:    diphenoxylate-atropine (LOMOTIL) 2.5-0.025 MG tablet, TAKE 2 TABLETS IN THE MORNING, AT NOON, IN THE EVENING AND AT BEDTIME, Disp: 240 tablet, Rfl: 3   eszopiclone (LUNESTA) 2 MG TABS tablet, TAKE 1 TABLET(2 MG) BY MOUTH AT BEDTIME AS NEEDED FOR SLEEP, Disp: 30 tablet, Rfl: 3   famotidine (PEPCID) 20 MG tablet, TAKE 2 TABLETS TWICE A DAY (Patient taking differently: Take 40 mg by mouth 2 (two) times daily.), Disp: 360 tablet, Rfl: 3   ferrous sulfate 325 (65 FE) MG tablet, Take 1 tablet (325 mg total) by mouth daily., Disp: 30 tablet, Rfl: 3   hydrocortisone (ANUSOL-HC) 2.5 % rectal cream, Place rectally 4 (four) times daily as needed for hemorrhoids or anal itching. Use 4 times daily x5 days, then 4 times daily as needed., Disp: 30 g, Rfl: 1   HYDROmorphone (DILAUDID) 4  MG tablet, Take 1 tablet (4 mg total) by mouth every 6 (six) hours as needed for severe pain., Disp: 30 tablet, Rfl: 0   loperamide (IMODIUM) 2 MG capsule, Take 2 capsules (4 mg total) by mouth 4 (four) times daily as needed for diarrhea or loose stools. (Patient taking differently: Take 4 mg by mouth in the morning, at noon, in the evening, and at bedtime.), Disp: , Rfl:    meclizine (ANTIVERT) 25 MG tablet, TAKE 1 TABLET(25 MG) BY MOUTH THREE TIMES DAILY AS NEEDED FOR DIZZINESS, Disp: 30 tablet, Rfl: 0   ondansetron (ZOFRAN) 8 MG tablet, Take 8 mg by mouth every 8 (eight) hours as needed for nausea., Disp: , Rfl:    orphenadrine (NORFLEX) 100 MG tablet, TAKE 1 TABLET(100 MG) BY MOUTH AT BEDTIME AS NEEDED FOR MUSCLE SPASMS, Disp: 30 tablet, Rfl: 2   potassium chloride (KLOR-CON M) 10 MEQ tablet, Take 2 tablets (20 mEq total) by mouth daily., Disp: 60 tablet, Rfl: 1   pramoxine-hydrocortisone (PROCTOCREAM-HC) 1-1 % rectal cream, Place 1 Application rectally 2 (two) times daily., Disp: 30 g, Rfl: 3   promethazine (PHENERGAN) 12.5 MG tablet, Take 1 tablet (12.5 mg total) by mouth 2 (two) times daily as needed. (Patient taking differently: Take 12.5 mg by mouth at bedtime.), Disp: 30 tablet, Rfl: 3   propranolol (INDERAL) 20 MG tablet, Take 1 tablet (20 mg total) by mouth 2 (two) times daily. (Patient taking differently: Take 20 mg by mouth 2 (two) times daily. For tremors), Disp: 180 tablet, Rfl: 1   oxyCODONE (ROXICODONE) 5 MG immediate release tablet, Take 1 tablet (5 mg total) by mouth every 4 (four) hours as needed for severe pain or breakthrough pain. (Patient not taking: Reported on 10/12/2022), Disp: 20 tablet, Rfl: 0  Current Facility-Administered Medications:    0.9 %  sodium chloride infusion, , Intravenous, Continuous, Aniston Christman, Rudell Cobb, MD, Last Rate: 500 mL/hr at 10/12/22 1142, New Bag at 10/12/22 1142   HYDROmorphone (DILAUDID) injection 1 mg, 1 mg, Intravenous, Once, Anaih Brander, Rudell Cobb, MD    potassium chloride 10 mEq in 100 mL IVPB, 10 mEq, Intravenous, Q1 Hr x 2, Joseantonio Dittmar, Rudell Cobb, MD, Last Rate: 100 mL/hr at 10/12/22 1208, 10 mEq at 10/12/22 1208  Facility-Administered Medications Ordered in Other Visits:    0.9 %  sodium chloride infusion, , Intravenous, Continuous, Curcio, Kristin R, NP, Last Rate: 50 mL/hr at 10/12/22 1141, New Bag at 10/12/22 1141  Allergies:  Allergies  Allergen Reactions   Penicillins  Shortness Of Breath    Other reaction(s): Irregular Heart Rate, Other (See Comments) Rapid heartrate    Alprazolam Hives and Other (See Comments)    Hard to arouse, unresponsiveness   Ativan [Lorazepam] Other (See Comments)    Note: tolerates midazolam fine Face & Throat Swelling.   Corticosteroids Other (See Comments)    Other reaction(s): Other (see comments) Psychotic behaviour     Erythromycin     Other reaction(s): Other (See Comments) Severe stomach pain    Prednisone Other (See Comments)    Anxiety & Nervous Breakdown.   [place]  [Milnacipran]    Prednisolone Anxiety    Past Medical History, Surgical history, Social history, and Family History were reviewed and updated.  Review of Systems: Review of Systems  Constitutional:  Positive for fatigue and unexpected weight change.  HENT:  Negative.    Eyes: Negative.   Respiratory: Negative.    Cardiovascular: Negative.   Gastrointestinal:  Positive for abdominal pain and diarrhea.  Endocrine: Negative.   Genitourinary:  Positive for dysuria.   Musculoskeletal:  Positive for back pain.  Skin: Negative.   Neurological: Negative.   Hematological: Negative.   Psychiatric/Behavioral:  Positive for depression.     Physical Exam:  height is '5\' 6"'$  (1.676 m) and weight is 222 lb 1.3 oz (100.7 kg). Her oral temperature is 97.4 F (36.3 C) (abnormal). Her blood pressure is 83/52 (abnormal) and her pulse is 77. Her respiration is 20 and oxygen saturation is 96%.   Wt Readings from Last 3 Encounters:   10/12/22 222 lb 1.3 oz (100.7 kg)  08/30/22 225 lb (102.1 kg)  08/04/22 220 lb (99.8 kg)    Physical Exam Vitals reviewed.  HENT:     Head: Normocephalic and atraumatic.  Eyes:     Pupils: Pupils are equal, round, and reactive to light.  Cardiovascular:     Rate and Rhythm: Normal rate and regular rhythm.     Heart sounds: Normal heart sounds.  Pulmonary:     Effort: Pulmonary effort is normal.     Breath sounds: Normal breath sounds.  Abdominal:     General: Bowel sounds are normal.     Palpations: Abdomen is soft.     Comments: Abdominal exam shows healed laparotomy scar.  She has no fluid wave.  There is no guarding or rebound tenderness.  She has decent bowel sounds.  There is no palpable liver or spleen tip.  Musculoskeletal:        General: No tenderness or deformity. Normal range of motion.     Cervical back: Normal range of motion.  Lymphadenopathy:     Cervical: No cervical adenopathy.  Skin:    General: Skin is warm and dry.     Findings: No erythema or rash.  Neurological:     Mental Status: She is alert and oriented to person, place, and time.  Psychiatric:        Behavior: Behavior normal.        Thought Content: Thought content normal.        Judgment: Judgment normal.      Lab Results  Component Value Date   WBC 7.2 10/12/2022   HGB 14.2 10/12/2022   HCT 43.8 10/12/2022   MCV 93.6 10/12/2022   PLT 295 10/12/2022     Chemistry      Component Value Date/Time   NA 139 10/12/2022 0945   K 3.7 10/12/2022 0945   CL 106 10/12/2022 0945   CO2 21 (  L) 10/12/2022 0945   BUN 26 (H) 10/12/2022 0945   CREATININE 1.45 (H) 10/12/2022 0945      Component Value Date/Time   CALCIUM 10.0 10/12/2022 0945   ALKPHOS 230 (H) 10/12/2022 0945   AST 47 (H) 10/12/2022 0945   ALT 38 10/12/2022 0945   BILITOT 0.6 10/12/2022 0945      Impression and Plan: Ms. Potteiger is a very charming 61 year-old white female.  She has an incredible history.  She now has had a  recurrence of her appendiceal cancer.  I am just absolutely amazed by this.  It has been about 15 years or so since she had her initial appendiceal cancer removed.  She went back up to [facility] for another HIPEC procedure.  She had a lot of issues afterwards.  She was hospitalized here with a lot of pain and diarrhea and nonhealing.  She eventually did improve.  Diarrhea continues to be a huge problem for her.  Again, we will make sure that we check C. difficile on her.  The last time she was checked, it was negative.  I will have to speak with one of our surgeons.  I probably have to speak with to them once we get the C. difficile titer back.  At least, the CT scan does not show any evidence of recurrent tumor.  Hopefully, the Pradaxa will help prevent any problem embolic disease from developing.  We are going to have to get her back probably in January.  I think she is will go back up to see her surgeon in early January.  I will see her back after that.  She will get IV fluids today.  Hopefully this will help her feel little bit better.  I will give her some Pepcid with this.   Volanda Napoleon, MD 12/20/202312:15 PM

## 2022-10-14 ENCOUNTER — Telehealth: Payer: Self-pay

## 2022-10-14 LAB — CA 125: Cancer Antigen (CA) 125: 6.3 U/mL (ref 0.0–38.1)

## 2022-10-14 LAB — CANCER ANTIGEN 19-9: CA 19-9: 20 U/mL (ref 0–35)

## 2022-10-14 NOTE — Telephone Encounter (Signed)
-----   Message from Wendy Napoleon, MD sent at 10/14/2022  4:16 PM EST ----- Call and let her know that the CA 19-9 and CA125 are both normal.  The C. difficile is only positive for the spore but not for the toxin.  As such she does not need to be treated for C. difficile.  Thanks.  [name]

## 2022-10-14 NOTE — Telephone Encounter (Signed)
Called and informed patient of results. She denies any questions or concerns at this time.

## 2022-10-22 ENCOUNTER — Other Ambulatory Visit: Payer: Self-pay | Admitting: Family Medicine

## 2022-10-22 DIAGNOSIS — F411 Generalized anxiety disorder: Secondary | ICD-10-CM

## 2022-10-25 ENCOUNTER — Other Ambulatory Visit: Payer: Self-pay | Admitting: Hematology & Oncology

## 2022-10-25 ENCOUNTER — Other Ambulatory Visit: Payer: Self-pay | Admitting: Family Medicine

## 2022-10-25 DIAGNOSIS — H8112 Benign paroxysmal vertigo, left ear: Secondary | ICD-10-CM

## 2022-10-25 DIAGNOSIS — R110 Nausea: Secondary | ICD-10-CM

## 2022-10-26 NOTE — Telephone Encounter (Signed)
Patient is requesting a refill of the following medications: Requested Prescriptions   Pending Prescriptions Disp Refills   promethazine (PHENERGAN) 12.5 MG tablet [Pharmacy Med Name: PROMETHAZINE 12.'5MG'$  TABLETS] 30 tablet 3    Sig: TAKE 1 TABLET(12.5 MG) BY MOUTH TWICE DAILY AS NEEDED   meclizine (ANTIVERT) 25 MG tablet [Pharmacy Med Name: MECLIZINE '25MG'$  RX TABLETS] 30 tablet 0    Sig: TAKE 1 TABLET(25 MG) BY MOUTH THREE TIMES DAILY AS NEEDED FOR DIZZINESS    Date of patient request: 10/26/22 Last office visit: 07/22/22 Date of last refill: 10/11/22 Last refill amount:30

## 2022-11-03 ENCOUNTER — Ambulatory Visit: Payer: Medicare Other | Admitting: Behavioral Health

## 2022-11-08 ENCOUNTER — Telehealth (INDEPENDENT_AMBULATORY_CARE_PROVIDER_SITE_OTHER): Payer: Medicare Other | Admitting: Behavioral Health

## 2022-11-08 ENCOUNTER — Encounter: Payer: Self-pay | Admitting: Behavioral Health

## 2022-11-08 DIAGNOSIS — F331 Major depressive disorder, recurrent, moderate: Secondary | ICD-10-CM | POA: Diagnosis not present

## 2022-11-08 DIAGNOSIS — F411 Generalized anxiety disorder: Secondary | ICD-10-CM

## 2022-11-08 NOTE — Progress Notes (Signed)
Wendy Barber [id number] May 15, 1961 62 y.o.  Virtual Visit via Video Note  I connected with pt @ on 11/08/22 at  3:00 PM EST by a video enabled telemedicine application and verified that I am speaking with the correct person using two identifiers.   I discussed the limitations of evaluation and management by telemedicine and the availability of in person appointments. The patient expressed understanding and agreed to proceed.  I discussed the assessment and treatment plan with the patient. The patient was provided an opportunity to ask questions and all were answered. The patient agreed with the plan and demonstrated an understanding of the instructions.   The patient was advised to call back or seek an in-person evaluation if the symptoms worsen or if the condition fails to improve as anticipated.  I provided 30 minutes of non-face-to-face time during this encounter.  The patient was located at home.  The provider was located at [place].   Elwanda Brooklyn, NP   Subjective:   Patient ID:  Wendy Barber is a 62 y.o. (DOB [birth year]) female.  Chief Complaint:  Chief Complaint  Patient presents with   Depression   Anxiety   Follow-up   Medication Problem   Patient Education   Stress    HPI  62 year old female presents to this office  for follow up and medication management.  She continues to appear tired and flat. Says that Wellbutrin increase has helped her slightly with "feeling brighter", but not enough. Still facing a multitude of health issues. She is considering whether a colostomy would assist in quality of life. She is requesting appropriate adjustment to her psychiatric medications if it would help further.  Reports her anxiety today at 3/10 and depression at 6/10. She is sleeping 6 hours per night. Her family remains highly supportive. She denies any mania, no psychosis, No current SI or HI.       No prior psychiatric medications   Review of Systems:   Review of Systems  Constitutional: Negative.   Allergic/Immunologic: Negative.   Neurological: Negative.   Psychiatric/Behavioral:  Positive for dysphoric mood. The patient is nervous/anxious.     Medications: I have reviewed the patient's current medications.  Current Outpatient Medications  Medication Sig Dispense Refill   amLODipine (NORVASC) 5 MG tablet Take 1 tablet (5 mg total) by mouth daily. 90 tablet 1   buPROPion (WELLBUTRIN XL) 300 MG 24 hr tablet Take 1 tablet (300 mg total) by mouth daily. 30 tablet 1   busPIRone (BUSPAR) 15 MG tablet Take 1 tablet (15 mg total) by mouth 3 (three) times daily. 270 tablet 3   citalopram (CELEXA) 40 MG tablet Take 1 tablet (40 mg total) by mouth daily. 90 tablet 1   cyanocobalamin 1000 MCG tablet Take 1 tablet (1,000 mcg total) by mouth daily.     Dabigatran Etexilate Mesylate (PRADAXA) 110 MG CAPS Take 110 mg by mouth 2 (two) times daily. 60 capsule 12   dicyclomine (BENTYL) 20 MG tablet TAKE 1 TABLET(20 MG) BY MOUTH THREE TIMES DAILY BEFORE MEALS 90 tablet 4   diphenoxylate-atropine (LOMOTIL) 2.5-0.025 MG tablet TAKE 2 TABLETS IN THE MORNING, AT NOON, IN THE EVENING AND AT BEDTIME 240 tablet 3   eszopiclone (LUNESTA) 2 MG TABS tablet TAKE 1 TABLET(2 MG) BY MOUTH AT BEDTIME AS NEEDED FOR SLEEP 30 tablet 3   famotidine (PEPCID) 20 MG tablet TAKE 2 TABLETS TWICE A DAY (Patient taking differently: Take 40 mg by mouth 2 (two) times daily.)  360 tablet 3   ferrous sulfate 325 (65 FE) MG tablet Take 1 tablet (325 mg total) by mouth daily. 30 tablet 3   hydrocortisone (ANUSOL-HC) 2.5 % rectal cream Place rectally 4 (four) times daily as needed for hemorrhoids or anal itching. Use 4 times daily x5 days, then 4 times daily as needed. 30 g 1   HYDROmorphone (DILAUDID) 4 MG tablet Take 1 tablet (4 mg total) by mouth every 6 (six) hours as needed for severe pain. 30 tablet 0   loperamide (IMODIUM) 2 MG capsule Take 2 capsules (4 mg total) by mouth 4 (four)  times daily as needed for diarrhea or loose stools. (Patient taking differently: Take 4 mg by mouth in the morning, at noon, in the evening, and at bedtime.)     meclizine (ANTIVERT) 25 MG tablet TAKE 1 TABLET(25 MG) BY MOUTH THREE TIMES DAILY AS NEEDED FOR DIZZINESS 30 tablet 0   ondansetron (ZOFRAN) 8 MG tablet Take 8 mg by mouth every 8 (eight) hours as needed for nausea.     orphenadrine (NORFLEX) 100 MG tablet TAKE 1 TABLET(100 MG) BY MOUTH AT BEDTIME AS NEEDED FOR MUSCLE SPASMS 30 tablet 2   oxyCODONE (ROXICODONE) 5 MG immediate release tablet Take 1 tablet (5 mg total) by mouth every 4 (four) hours as needed for severe pain or breakthrough pain. (Patient not taking: Reported on 10/12/2022) 20 tablet 0   potassium chloride (KLOR-CON M) 10 MEQ tablet Take 2 tablets (20 mEq total) by mouth daily. 60 tablet 1   pramoxine-hydrocortisone (PROCTOCREAM-HC) 1-1 % rectal cream Place 1 Application rectally 2 (two) times daily. 30 g 3   promethazine (PHENERGAN) 12.5 MG tablet TAKE 1 TABLET(12.5 MG) BY MOUTH TWICE DAILY AS NEEDED 30 tablet 3   propranolol (INDERAL) 20 MG tablet TAKE 1 TABLET(20 MG) BY MOUTH TWICE DAILY 180 tablet 1   No current facility-administered medications for this visit.    Medication Side Effects: None  Allergies:  Allergies  Allergen Reactions   Penicillins Shortness Of Breath    Other reaction(s): Irregular Heart Rate, Other (See Comments) Rapid heartrate    Alprazolam Hives and Other (See Comments)    Hard to arouse, unresponsiveness   Ativan [Lorazepam] Other (See Comments)    Note: tolerates midazolam fine Face & Throat Swelling.   Corticosteroids Other (See Comments)    Other reaction(s): Other (see comments) Psychotic behaviour     Erythromycin     Other reaction(s): Other (See Comments) Severe stomach pain    Prednisone Other (See Comments)    Anxiety & Nervous Breakdown.   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   Pernicious anemia 05/14/2021   Presence of IVC filter 03/19/2020   Pulmonary embolism, bilateral ([place]) 03/19/2020    Family History  Problem Relation Age of Onset   Depression Mother    Drug abuse Sister    Suicidality Sister    Alcohol abuse Brother  Drug abuse Brother     Social History   Socioeconomic History   Marital status: Married    Spouse name: Not on file   Number of children: 3   Years of education: Not on file   Highest education level: Some college, no degree  Occupational History   Occupation: disability  Tobacco Use   Smoking status: Never   Smokeless tobacco: Never  Vaping Use   Vaping Use: Never used  Substance and Sexual Activity   Alcohol use: No   Drug use: Never   Sexual activity: Yes    Birth control/protection: None    Comment: hysterectomy  Other Topics Concern   Not on file  Social History Narrative   Right handed   One story home   Drinks occasional caffeine   Social Determinants of Health   Financial Resource Strain: Not on file  Food Insecurity: Not on file  Transportation Needs: Not on file  Physical Activity: Not on file  Stress: Not on file  Social Connections: Not on file  Intimate Partner Violence: Not on file    Past Medical History, Surgical history, Social history, and Family history were reviewed and updated as appropriate.   Please see review of systems for further details on the patient's review from today.   Objective:    Physical Exam:  There were no vitals taken for this visit.  Physical Exam Neurological:     Mental Status: She is alert and oriented to person, place, and time.  Psychiatric:        Attention and Perception: Attention and perception normal.        Mood and Affect: Mood and affect normal.        Speech: Speech normal.        Behavior: Behavior normal. Behavior is cooperative.        Cognition and Memory: Cognition and memory normal.        Judgment: Judgment normal.     Comments: Insight intact     Lab Review:     Component Value Date/Time   NA 139 10/12/2022 0945   K 3.7 10/12/2022 0945   CL 106 10/12/2022 0945   CO2 21 (L) 10/12/2022 0945   GLUCOSE 162 (H) 10/12/2022 0945   BUN 26 (H) 10/12/2022 0945   CREATININE 1.45 (H) 10/12/2022 0945   CALCIUM 10.0 10/12/2022 0945   PROT 8.2 (H) 10/12/2022 0945   ALBUMIN 4.5 10/12/2022 0945   AST 47 (H) 10/12/2022 0945   ALT 38 10/12/2022 0945   ALKPHOS 230 (H) 10/12/2022 0945   BILITOT 0.6 10/12/2022 0945   GFRNONAA 42 (L) 10/12/2022 0945   GFRAA >60 07/27/2020 0922       Component Value Date/Time   WBC 7.2 10/12/2022 0945   WBC 7.0 09/12/2022 1311   RBC 4.68 10/12/2022 0945   HGB 14.2 10/12/2022 0945   HCT 43.8 10/12/2022 0945   PLT 295 10/12/2022 0945   MCV 93.6 10/12/2022 0945   MCH 30.3 10/12/2022 0945   MCHC 32.4 10/12/2022 0945   RDW 14.7 10/12/2022 0945   LYMPHSABS 3.0 10/12/2022 0945   MONOABS 0.9 10/12/2022 0945   EOSABS 0.2 10/12/2022 0945   BASOSABS 0.1 10/12/2022 0945    No results found for: "POCLITH", "LITHIUM"   No results found for: "PHENYTOIN", "PHENOBARB", "VALPROATE", "CBMZ"   .res Assessment: Plan:    Greater than 50% of 30 min visit with patient was spent on counseling and coordination of care.  We discussed her  current level of stability with anxiety and depression. She feels like dosage increase of Wellbutrin has helped slightly with being more energized. She remains discouraged at times  due to complex heath issues. We discussed medication and did decided to adjust to if it would help. Discussed medication options available.  We agreed to: Continue Wellbutrin to 300 mg XR daily Continue Buspar  to 15 mg to  three times daily as needed. Continue Celexa to 40 mg daily. Will take with food to help with nausea. Could make diarrhea worse in the beginning but may pass after a week or two.  To Continue Lunesta 5 mg at betime. Belsomra was not approved by insurance. She is happy with Johnnye Sima but says causes metallic taste in mouth next day.  Will report worsening symptoms or side effects promptly To follow up in 4 weeks to reassess Will continue to follow up with PCP regularly Provided emergency contact information Reviewed [place], NP              There are no diagnoses linked to this encounter.   Please see After Visit Summary for patient specific instructions.  Future Appointments  Date Time Provider [place]  11/17/2022  3:00 PM [facility] LAB [facility] None  11/17/2022  3:15 PM [facility] INJ NURSE [facility] None  11/17/2022  3:30 PM Ennever, Rudell Cobb, MD [facility] None  04/17/2023 10:50 AM [name], Melanie Crazier, MD [facility] None    No orders of the defined types were placed in this encounter.     -------------------------------

## 2022-11-09 ENCOUNTER — Other Ambulatory Visit: Payer: Self-pay | Admitting: Family Medicine

## 2022-11-09 DIAGNOSIS — I10 Essential (primary) hypertension: Secondary | ICD-10-CM

## 2022-11-17 ENCOUNTER — Inpatient Hospital Stay: Payer: Medicare Other

## 2022-11-17 ENCOUNTER — Inpatient Hospital Stay: Payer: Medicare Other | Admitting: Hematology & Oncology

## 2022-11-26 ENCOUNTER — Other Ambulatory Visit: Payer: Self-pay | Admitting: Behavioral Health

## 2022-11-26 ENCOUNTER — Other Ambulatory Visit: Payer: Self-pay | Admitting: Family Medicine

## 2022-11-26 DIAGNOSIS — F331 Major depressive disorder, recurrent, moderate: Secondary | ICD-10-CM

## 2022-11-26 DIAGNOSIS — H8112 Benign paroxysmal vertigo, left ear: Secondary | ICD-10-CM

## 2022-11-26 DIAGNOSIS — F411 Generalized anxiety disorder: Secondary | ICD-10-CM

## 2022-12-02 ENCOUNTER — Inpatient Hospital Stay: Payer: Medicare Other | Admitting: Hematology & Oncology

## 2022-12-02 ENCOUNTER — Inpatient Hospital Stay: Payer: Medicare Other

## 2022-12-02 ENCOUNTER — Encounter: Payer: Self-pay | Admitting: Hematology & Oncology

## 2022-12-05 ENCOUNTER — Encounter: Payer: Self-pay | Admitting: Family Medicine

## 2022-12-06 ENCOUNTER — Encounter: Payer: Self-pay | Admitting: Family Medicine

## 2022-12-06 ENCOUNTER — Ambulatory Visit (INDEPENDENT_AMBULATORY_CARE_PROVIDER_SITE_OTHER): Payer: Medicare Other | Admitting: Family Medicine

## 2022-12-06 ENCOUNTER — Telehealth: Payer: Self-pay | Admitting: Family Medicine

## 2022-12-06 VITALS — BP 116/68 | HR 85 | Temp 98.4°F | Ht 66.0 in | Wt 241.2 lb

## 2022-12-06 DIAGNOSIS — R7989 Other specified abnormal findings of blood chemistry: Secondary | ICD-10-CM

## 2022-12-06 DIAGNOSIS — R051 Acute cough: Secondary | ICD-10-CM

## 2022-12-06 DIAGNOSIS — M79662 Pain in left lower leg: Secondary | ICD-10-CM

## 2022-12-06 DIAGNOSIS — R5383 Other fatigue: Secondary | ICD-10-CM | POA: Diagnosis not present

## 2022-12-06 DIAGNOSIS — R052 Subacute cough: Secondary | ICD-10-CM | POA: Diagnosis not present

## 2022-12-06 DIAGNOSIS — R0600 Dyspnea, unspecified: Secondary | ICD-10-CM

## 2022-12-06 DIAGNOSIS — Z86718 Personal history of other venous thrombosis and embolism: Secondary | ICD-10-CM | POA: Diagnosis not present

## 2022-12-06 LAB — CBC
HCT: 38.9 % (ref 36.0–46.0)
Hemoglobin: 12.7 g/dL (ref 12.0–15.0)
MCHC: 32.7 g/dL (ref 30.0–36.0)
MCV: 94.6 fl (ref 78.0–100.0)
Platelets: 274 10*3/uL (ref 150.0–400.0)
RBC: 4.11 Mil/uL (ref 3.87–5.11)
RDW: 14.2 % (ref 11.5–15.5)
WBC: 8.6 10*3/uL (ref 4.0–10.5)

## 2022-12-06 LAB — POCT RESPIRATORY SYNCYTIAL VIRUS: RSV Rapid Ag: NEGATIVE

## 2022-12-06 LAB — BASIC METABOLIC PANEL WITH GFR
BUN: 21 mg/dL (ref 6–23)
CO2: 30 meq/L (ref 19–32)
Calcium: 9.6 mg/dL (ref 8.4–10.5)
Chloride: 104 meq/L (ref 96–112)
Creatinine, Ser: 1.07 mg/dL (ref 0.40–1.20)
GFR: 56.62 mL/min — ABNORMAL LOW
Glucose, Bld: 180 mg/dL — ABNORMAL HIGH (ref 70–99)
Potassium: 3.9 meq/L (ref 3.5–5.1)
Sodium: 140 meq/L (ref 135–145)

## 2022-12-06 LAB — POC COVID19 BINAXNOW: SARS Coronavirus 2 Ag: NEGATIVE

## 2022-12-06 LAB — POCT INFLUENZA A/B
Influenza A, POC: NEGATIVE
Influenza B, POC: NEGATIVE

## 2022-12-06 NOTE — Telephone Encounter (Signed)
Caller name: Referral GK:[id number] CT ANGIO CHEST PE W/CM &/OR WO   On DPR?: Yes  Call back number: [phone]  Provider they see: [name], MD  Reason for call: Please change from radiology to vascular

## 2022-12-06 NOTE — Patient Instructions (Signed)
COVID, flu, RSV test were negative in the office.  Oxygen stayed above 94%.  With the shortness of breath and increasing cough I will check a CT scan today hopefully as well as an ultrasound of your leg to rule out a blood clot.  Someone should be calling you to schedule the studies.  For now okay to use Mucinex or Mucinex DM, and that should help along with your promethazine to help control cough.  If any acute worsening symptoms proceed to the emergency room as we discussed.  Additionally I will look at blood counts, electrolytes including kidney function test that was elevated when you were dehydrated previously.  Make sure to drink sufficient fluids, rest, and be seen if any acute changes.  Hang in there.

## 2022-12-06 NOTE — Progress Notes (Unsigned)
Subjective:  Patient ID: Wendy Barber, female    DOB: June 28, 1961  Age: 62 y.o. MRN: [id number]  CC:  Chief Complaint  Patient presents with   Cough    Notes cough on and off last few weeks has progressively gotten worse over the last 5 days, sore throat, no other sxs Neg COVID test 12/05/22    HPI Wendy Barber presents for   Cough On and off past few weeks, worsening past 5 days - more of a dry cough, rare green phlegm. with some sore throat at times - intermittent.  No PND. No fever.  Trouble catching breath at times, weak and dizzy at times. More than usual. Dizzy and weak at 12/20 appt with Dr. [name] - vomiting and diarrhea then, dehydrated. Does not feel that bad currently.  No syncope. Short of breath past few days.  No chest pain, no palpitations.  No known sick contacts.  Negative COVID test at home yesterday.   Attempted treatments, NyQuil, few times, not sure it helps. On chronic zofran during day, promethazine at night. Once gets to sleep - able to sleep ok.   Cough treated with doxycycline last June. No flu vaccine this year.   Hx of bilateral LE DVT, prior lovenox- stopped. now on Pradaxa 162m BID since 12/20 - off Lovenox since that time. Some pain in left leg at times. Occasional left leg tightness. Hx of SMV thrombus., recurrent metastatic appendiceal cancer. Has IVC filter.   Elevated creatinine 12/20 - dehydration at that time with BP 83/52. .Marland Kitchen1.45, up from 1.01 on 09/12/22. Hgb normal at that time.    Immunization History  Administered Date(s) Administered   Influenza,inj,Quad PF,6+ Mos 08/17/2017   Influenza-Unspecified 11/27/2021   PFIZER(Purple Top)SARS-COV-2 Vaccination 01/04/2020, 01/25/2020, 07/18/2020   Pneumococcal Polysaccharide-23 08/17/2017    History Patient Active Problem List   Diagnosis Date Noted   Enteritis 07/23/2022   Fall at home, initial encounter 07/23/2022   AMS (altered mental status) 07/23/2022   Hemorrhoids 06/25/2022    Occasional tremors: Essential tremors 06/23/2022   Hypomagnesemia 06/22/2022   Iron deficiency anemia    Gastritis 06/18/2022   Depression 06/18/2022   UTI (urinary tract infection) 06/16/2022   DVT, bilateral lower limbs ([facility] 06/14/2022   Symptomatic bradycardia 06/13/2022   History of excision of intestinal structure 06/13/2022   CAD (coronary artery disease) 06/13/2022   Acute lower GI bleeding 05/04/2022   AKI (acute kidney injury) ([facility] 05/04/2022   History of deep vein thrombosis (DVT) of lower extremity 05/04/2022   History of pulmonary embolus (PE) 05/04/2022   Anxiety 05/04/2022   Pernicious anemia 05/14/2021   Iron deficiency anemia due to chronic blood loss 05/14/2021   Abdominal wall abscess    Surgical wound infection 01/16/2021   C. difficile colitis 01/16/2021   Type 2 diabetes mellitus with hyperglycemia ([place] 08/26/2020   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[facility] 03/19/2020   Goals of care, counseling/discussion 03/19/2020   Malignant pseudomyxoma peritonei ([facility] 03/19/2020   DVT of deep femoral vein, left ([facility] 03/19/2020   Pulmonary embolism, bilateral ([place] 03/19/2020   Presence of IVC filter 03/19/2020   Hepatic encephalopathy ([place] 11/22/2019   Confusion 11/21/2019   Autoimmune hepatitis ([facility] 11/21/2019   Uncontrolled hypertension 11/21/2019   DM2 (diabetes mellitus, type 2) ([facility] 11/21/2019   Closed right ankle fracture 08/27/2019   Acute deep vein thrombosis (DVT) of femoral vein of left lower extremity ([facility] 08/03/2018   History of colon cancer 08/03/2018  History of partial colectomy 08/03/2018   Spinal stenosis 08/03/2018   Morbid obesity ([place]) 08/03/2018   Cerebral meningioma ([place]) 01/05/2018   Arthritis of right shoulder region 02/18/2017   History of renal calculi 02/14/2016   Midline low back pain 02/14/2016   Class 3 obesity ([place]) 01/07/2016   Migraine with aura and without status migrainosus, not intractable  01/07/2016   Chronic fatigue syndrome 09/23/2013   GERD (gastroesophageal reflux disease) 09/23/2013   History of hepatitis A 09/23/2013   Rheumatoid arthritis involving multiple sites ([place]) 09/23/2013   Past Medical History:  Diagnosis Date   Arthritis    Back pain    Cancer ([place])    pseudomyxoma peritonei   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[place]) 03/19/2020   Chronic fatigue syndrome    Diabetes mellitus without complication (HCC)    DVT of deep femoral vein, left ([place]) 03/19/2020   Fibromyalgia    Goals of care, counseling/discussion 03/19/2020   Hypertension    Iron deficiency anemia due to chronic blood loss 05/14/2021   Malignant pseudomyxoma peritonei ([place]) 03/19/2020   Pernicious anemia 05/14/2021   Presence of IVC filter 03/19/2020   Pulmonary embolism, bilateral ([place]) 03/19/2020   Past Surgical History:  Procedure Laterality Date   ABDOMINAL HYSTERECTOMY     ABDOMINAL SURGERY     ACHILLES TENDON REPAIR     APPENDECTOMY     ARTHROPLASTY     CARPAL TUNNEL RELEASE     CHOLECYSTECTOMY     IR CATHETER TUBE CHANGE  02/02/2021   IR CATHETER TUBE CHANGE  02/25/2021   IR IMAGING GUIDED PORT INSERTION  06/20/2022   IR RADIOLOGIST EVAL & MGMT  02/24/2021   IR RADIOLOGIST EVAL & MGMT  03/10/2021   IR RADIOLOGIST EVAL & MGMT  06/22/2022   IR THROMBECT VENO MECH MOD SED  06/20/2022   IR US GUIDE BX ASP/DRAIN  11/25/2019   IR US GUIDE VASC ACCESS LEFT  06/20/2022   IR US GUIDE VASC ACCESS RIGHT  06/20/2022   IR US GUIDE VASC ACCESS RIGHT  06/20/2022   IR VENO/EXT/BI  06/20/2022   IR VENOCAVAGRAM IVC  06/20/2022   JOINT REPLACEMENT     KNEE ARTHROSCOPY     ORIF ANKLE FRACTURE Right 08/27/2019   Procedure: OPEN REDUCTION INTERNAL FIXATION RIGHT ANKLE FRACTURE;  Surgeon: Renette Butters, MD;  Location: [facility];  Service: Orthopedics;  Laterality: Right;   perineorrophy     TONGUE BIOPSY     Allergies  Allergen Reactions   Penicillins Shortness Of Breath     Other reaction(s): Irregular Heart Rate, Other (See Comments) Rapid heartrate    Alprazolam Hives and Other (See Comments)    Hard to arouse, unresponsiveness   Ativan [Lorazepam] Other (See Comments)    Note: tolerates midazolam fine Face & Throat Swelling.   Corticosteroids Other (See Comments)    Other reaction(s): Other (see comments) Psychotic behaviour     Erythromycin     Other reaction(s): Other (See Comments) Severe stomach pain    Prednisone Other (See Comments)    Anxiety & Nervous Breakdown.   Savella  [Milnacipran]    Prednisolone Anxiety   Prior to Admission medications   Medication Sig Start Date End Date Taking? Authorizing Provider  amLODipine (NORVASC) 5 MG tablet TAKE 1 TABLET(5 MG) BY MOUTH DAILY 11/09/22  Yes [name], MD  buPROPion (WELLBUTRIN XL) 300 MG 24 hr tablet TAKE 1 TABLET(300 MG) BY MOUTH DAILY 11/28/22  Yes  White, Brian A, NP  busPIRone (BUSPAR) 15 MG tablet Take 1 tablet (15 mg total) by mouth 3 (three) times daily. 04/05/22  Yes Lesle Chris A, NP  citalopram (CELEXA) 40 MG tablet Take 1 tablet (40 mg total) by mouth daily. 09/21/22  Yes Lesle Chris A, NP  cyanocobalamin 1000 MCG tablet Take 1 tablet (1,000 mcg total) by mouth daily. 07/26/22  Yes Cherene Altes, MD  Dabigatran Etexilate Mesylate (PRADAXA) 110 MG CAPS Take 110 mg by mouth 2 (two) times daily. 10/12/22  Yes Ennever, Rudell Cobb, MD  dicyclomine (BENTYL) 20 MG tablet TAKE 1 TABLET(20 MG) BY MOUTH THREE TIMES DAILY BEFORE MEALS 10/25/22  Yes Ennever, Rudell Cobb, MD  diphenoxylate-atropine (LOMOTIL) 2.5-0.025 MG tablet TAKE 2 TABLETS IN THE MORNING, AT NOON, IN THE EVENING AND AT BEDTIME 04/22/22  Yes Ennever, Rudell Cobb, MD  eszopiclone (LUNESTA) 2 MG TABS tablet TAKE 1 TABLET(2 MG) BY MOUTH AT BEDTIME AS NEEDED FOR SLEEP 09/21/22  Yes White, Brian A, NP  famotidine (PEPCID) 20 MG tablet TAKE 2 TABLETS TWICE A DAY Patient taking differently: Take 40 mg by mouth 2 (two) times daily. 03/01/22   Yes Volanda Napoleon, MD  ferrous sulfate 325 (65 FE) MG tablet Take 1 tablet (325 mg total) by mouth daily. 06/03/22 06/03/23 Yes Shah, Pratik D, DO  hydrocortisone (ANUSOL-HC) 2.5 % rectal cream Place rectally 4 (four) times daily as needed for hemorrhoids or anal itching. Use 4 times daily x5 days, then 4 times daily as needed. 06/26/22  Yes Eugenie Filler, MD  HYDROmorphone (DILAUDID) 4 MG tablet Take 1 tablet (4 mg total) by mouth every 6 (six) hours as needed for severe pain. 10/12/22  Yes Ennever, Rudell Cobb, MD  loperamide (IMODIUM) 2 MG capsule Take 2 capsules (4 mg total) by mouth 4 (four) times daily as needed for diarrhea or loose stools. Patient taking differently: Take 4 mg by mouth in the morning, at noon, in the evening, and at bedtime. 05/08/22  Yes Aline August, MD  meclizine (ANTIVERT) 25 MG tablet TAKE 1 TABLET(25 MG) BY MOUTH THREE TIMES DAILY AS NEEDED FOR DIZZINESS 11/28/22  Yes [name], MD  ondansetron (ZOFRAN) 8 MG tablet Take 8 mg by mouth every 8 (eight) hours as needed for nausea. 03/03/21  Yes [provider]  orphenadrine (NORFLEX) 100 MG tablet TAKE 1 TABLET(100 MG) BY MOUTH AT BEDTIME AS NEEDED FOR MUSCLE SPASMS 10/10/22  Yes Ennever, Rudell Cobb, MD  potassium chloride (KLOR-CON M) 10 MEQ tablet Take 2 tablets (20 mEq total) by mouth daily. 07/07/22  Yes [name], MD  pramoxine-hydrocortisone (PROCTOCREAM-HC) 1-1 % rectal cream Place 1 Application rectally 2 (two) times daily. 07/04/22  Yes Volanda Napoleon, MD  promethazine (PHENERGAN) 12.5 MG tablet TAKE 1 TABLET(12.5 MG) BY MOUTH TWICE DAILY AS NEEDED 10/26/22  Yes [name], MD  propranolol (INDERAL) 20 MG tablet TAKE 1 TABLET(20 MG) BY MOUTH TWICE DAILY 10/25/22  Yes [name], MD   Social History   Socioeconomic History   Marital status: Married    Spouse name: Not on file   Number of children: 3   Years of education: Not on file   Highest education level: Some college, no  degree  Occupational History   Occupation: disability  Tobacco Use   Smoking status: Never   Smokeless tobacco: Never  Vaping Use   Vaping Use: Never used  Substance and Sexual Activity   Alcohol use: No  Drug use: Never   Sexual activity: Yes    Birth control/protection: None    Comment: hysterectomy  Other Topics Concern   Not on file  Social History Narrative   Right handed   One story home   Drinks occasional caffeine   Social Determinants of Health   Financial Resource Strain: Not on file  Food Insecurity: Not on file  Transportation Needs: Not on file  Physical Activity: Not on file  Stress: Not on file  Social Connections: Not on file  Intimate Partner Violence: Not on file    Review of Systems Per HPI.   Objective:   Vitals:   12/06/22 0954  BP: 116/68  Pulse: 85  Temp: 98.4 F (36.9 C)  TempSrc: Temporal  SpO2: 95%  Weight: 241 lb 3.2 oz (109.4 kg)  Height: 5' 6"$  (1.676 m)     Physical Exam Vitals reviewed.  Constitutional:      General: She is not in acute distress.    Appearance: She is well-developed.  HENT:     Head: Normocephalic and atraumatic.     Right Ear: Hearing, tympanic membrane, ear canal and external ear normal.     Left Ear: Hearing, tympanic membrane, ear canal and external ear normal.     Nose: Nose normal.     Mouth/Throat:     Pharynx: No posterior oropharyngeal erythema.  Eyes:     Conjunctiva/sclera: Conjunctivae normal.     Pupils: Pupils are equal, round, and reactive to light.  Cardiovascular:     Rate and Rhythm: Normal rate and regular rhythm.     Heart sounds: Normal heart sounds. No murmur heard. Pulmonary:     Effort: Pulmonary effort is normal. No respiratory distress.     Breath sounds: Normal breath sounds. No wheezing or rhonchi.  Musculoskeletal:     Comments: Left calf ttp distally without palpable cords.   Skin:    General: Skin is warm and dry.     Findings: No rash.  Neurological:     Mental  Status: She is alert and oriented to person, place, and time.  Psychiatric:        Mood and Affect: Mood normal.        Behavior: Behavior normal.    Results for orders placed or performed in visit on 12/06/22  POCT Influenza A/B  Result Value Ref Range   Influenza A, POC Negative Negative   Influenza B, POC Negative Negative  POCT respiratory syncytial virus  Result Value Ref Range   RSV Rapid Ag Negative   POC COVID-19 BinaxNow  Result Value Ref Range   SARS Coronavirus 2 Ag Negative Negative   Ambulatory pulse ox - 94-96%  58 minutes spent during visit, including chart review of prior medical history, history of current illness, discussion of workup and plan, review of test results, counseling and assimilation of information, exam, and chart completion.    Assessment & Plan:  Wendy Barber is a 62 y.o. female . Acute cough - Plan: POCT Influenza A/B, CBC, POCT respiratory syncytial virus, POC COVID-19 BinaxNow  History of DVT (deep vein thrombosis) - Plan: CT Angio Chest W/Cm &/Or Wo Cm, US Venous Img Lower Unilateral Left (DVT)  Fatigue, unspecified type  Subacute cough - Plan: CT Angio Chest W/Cm &/Or Wo Cm  Pain of left calf - Plan: US Venous Img Lower Unilateral Left (DVT)  Dyspnea, unspecified type - Plan: CT Angio Chest W/Cm &/Or Wo Cm, CBC  Elevated serum creatinine -  Plan: Basic metabolic panel  Persistent cough with recent worsening as above, with history of DVT, left calf pain ultrasound ordered, CT angiogram ordered to rule out PE.  Has been compliant with Pradaxa, but has been off Lovenox as above.  Prior elevated creatinine likely volume depletion.  Repeat labs ordered as well as CBC with her dyspnea and cough.  Held on antibiotics with clear exam and history of C. difficile.  ER precautions given while waiting on imaging.   No orders of the defined types were placed in this encounter.  Patient Instructions  COVID, flu, RSV test were negative in the  office.  Oxygen stayed above 94%.  With the shortness of breath and increasing cough I will check a CT scan today hopefully as well as an ultrasound of your leg to rule out a blood clot.  Someone should be calling you to schedule the studies.  For now okay to use Mucinex or Mucinex DM, and that should help along with your promethazine to help control cough.  If any acute worsening symptoms proceed to the emergency room as we discussed.  Additionally I will look at blood counts, electrolytes including kidney function test that was elevated when you were dehydrated previously.  Make sure to drink sufficient fluids, rest, and be seen if any acute changes.  Hang in there.     Signed,   Merri Ray, MD [place] 12/06/22 10:58 AM  9:13 AM 12/07/22 Call from radiologist - CTA is positive for PE. No R heart strain.  Report: 1. Examination is positive for acute pulmonary embolus within a segmental branch of the right lower lobe pulmonary artery. No signs of right heart strain. 2. Coronary artery calcifications noted. 3. 1.7 cm right lobe of thyroid nodule. Recommend thyroid US. 4.  Aortic Atherosclerosis (ICD10-I70.0).  Called patient -given her history and current anticoagulation, will have her be seen through ER for possible admission, evaluation for change in treatment. She is waiting for doppler of leg now, will have that done first. Plan on thyroid follow up outpatient unless imaged in hospital.

## 2022-12-06 NOTE — Telephone Encounter (Signed)
Please change order

## 2022-12-07 ENCOUNTER — Encounter: Payer: Self-pay | Admitting: Hematology & Oncology

## 2022-12-07 ENCOUNTER — Inpatient Hospital Stay (HOSPITAL_COMMUNITY)
Admission: RE | Admit: 2022-12-07 | Discharge: 2022-12-07 | Disposition: A | Discharge status: Home / Self Care | Payer: Medicare Other | Source: Ambulatory Visit | Attending: Family Medicine | Admitting: Family Medicine

## 2022-12-07 ENCOUNTER — Telehealth: Payer: Self-pay

## 2022-12-07 ENCOUNTER — Encounter (HOSPITAL_COMMUNITY): Payer: Self-pay | Admitting: Emergency Medicine

## 2022-12-07 ENCOUNTER — Encounter: Payer: Self-pay | Admitting: Family Medicine

## 2022-12-07 ENCOUNTER — Encounter (HOSPITAL_COMMUNITY): Payer: Self-pay

## 2022-12-07 ENCOUNTER — Inpatient Hospital Stay (HOSPITAL_COMMUNITY)
Admission: EM | Admit: 2022-12-07 | Discharge: 2022-12-09 | DRG: 175 | Disposition: A | Discharge status: Home / Self Care | Payer: Medicare Other | Attending: Family Medicine | Admitting: Family Medicine

## 2022-12-07 ENCOUNTER — Ambulatory Visit (HOSPITAL_BASED_OUTPATIENT_CLINIC_OR_DEPARTMENT_OTHER)
Admission: RE | Admit: 2022-12-07 | Discharge: 2022-12-07 | Disposition: A | Discharge status: Home / Self Care | Payer: Medicare Other | Source: Ambulatory Visit | Attending: Family Medicine | Admitting: Family Medicine

## 2022-12-07 ENCOUNTER — Other Ambulatory Visit: Payer: Self-pay

## 2022-12-07 DIAGNOSIS — F32A Depression, unspecified: Secondary | ICD-10-CM | POA: Diagnosis present

## 2022-12-07 DIAGNOSIS — R052 Subacute cough: Secondary | ICD-10-CM | POA: Insufficient documentation

## 2022-12-07 DIAGNOSIS — K529 Noninfective gastroenteritis and colitis, unspecified: Secondary | ICD-10-CM | POA: Diagnosis present

## 2022-12-07 DIAGNOSIS — M797 Fibromyalgia: Secondary | ICD-10-CM | POA: Diagnosis present

## 2022-12-07 DIAGNOSIS — I2510 Atherosclerotic heart disease of native coronary artery without angina pectoris: Secondary | ICD-10-CM | POA: Diagnosis present

## 2022-12-07 DIAGNOSIS — Z90710 Acquired absence of both cervix and uterus: Secondary | ICD-10-CM

## 2022-12-07 DIAGNOSIS — F419 Anxiety disorder, unspecified: Secondary | ICD-10-CM | POA: Diagnosis present

## 2022-12-07 DIAGNOSIS — I2699 Other pulmonary embolism without acute cor pulmonale: Secondary | ICD-10-CM | POA: Diagnosis present

## 2022-12-07 DIAGNOSIS — G250 Essential tremor: Secondary | ICD-10-CM | POA: Diagnosis present

## 2022-12-07 DIAGNOSIS — Z86718 Personal history of other venous thrombosis and embolism: Secondary | ICD-10-CM

## 2022-12-07 DIAGNOSIS — G9332 Myalgic encephalomyelitis/chronic fatigue syndrome: Secondary | ICD-10-CM | POA: Diagnosis present

## 2022-12-07 DIAGNOSIS — I82403 Acute embolism and thrombosis of unspecified deep veins of lower extremity, bilateral: Secondary | ICD-10-CM | POA: Diagnosis not present

## 2022-12-07 DIAGNOSIS — M1990 Unspecified osteoarthritis, unspecified site: Secondary | ICD-10-CM | POA: Diagnosis present

## 2022-12-07 DIAGNOSIS — K219 Gastro-esophageal reflux disease without esophagitis: Secondary | ICD-10-CM | POA: Diagnosis present

## 2022-12-07 DIAGNOSIS — Z7901 Long term (current) use of anticoagulants: Secondary | ICD-10-CM

## 2022-12-07 DIAGNOSIS — E669 Obesity, unspecified: Secondary | ICD-10-CM | POA: Diagnosis present

## 2022-12-07 DIAGNOSIS — Z95828 Presence of other vascular implants and grafts: Secondary | ICD-10-CM

## 2022-12-07 DIAGNOSIS — Z880 Allergy status to penicillin: Secondary | ICD-10-CM

## 2022-12-07 DIAGNOSIS — Z79899 Other long term (current) drug therapy: Secondary | ICD-10-CM

## 2022-12-07 DIAGNOSIS — I10 Essential (primary) hypertension: Secondary | ICD-10-CM | POA: Diagnosis present

## 2022-12-07 DIAGNOSIS — K55059 Acute (reversible) ischemia of intestine, part and extent unspecified: Secondary | ICD-10-CM | POA: Diagnosis present

## 2022-12-07 DIAGNOSIS — D500 Iron deficiency anemia secondary to blood loss (chronic): Secondary | ICD-10-CM | POA: Diagnosis present

## 2022-12-07 DIAGNOSIS — E1169 Type 2 diabetes mellitus with other specified complication: Secondary | ICD-10-CM

## 2022-12-07 DIAGNOSIS — K754 Autoimmune hepatitis: Secondary | ICD-10-CM | POA: Diagnosis present

## 2022-12-07 DIAGNOSIS — Z6838 Body mass index (BMI) 38.0-38.9, adult: Secondary | ICD-10-CM

## 2022-12-07 DIAGNOSIS — I82411 Acute embolism and thrombosis of right femoral vein: Secondary | ICD-10-CM | POA: Diagnosis present

## 2022-12-07 DIAGNOSIS — R0600 Dyspnea, unspecified: Secondary | ICD-10-CM | POA: Insufficient documentation

## 2022-12-07 DIAGNOSIS — Z86711 Personal history of pulmonary embolism: Secondary | ICD-10-CM

## 2022-12-07 DIAGNOSIS — Z888 Allergy status to other drugs, medicaments and biological substances status: Secondary | ICD-10-CM

## 2022-12-07 DIAGNOSIS — I2693 Single subsegmental pulmonary embolism without acute cor pulmonale: Principal | ICD-10-CM

## 2022-12-07 DIAGNOSIS — C772 Secondary and unspecified malignant neoplasm of intra-abdominal lymph nodes: Secondary | ICD-10-CM

## 2022-12-07 DIAGNOSIS — C786 Secondary malignant neoplasm of retroperitoneum and peritoneum: Secondary | ICD-10-CM | POA: Diagnosis present

## 2022-12-07 DIAGNOSIS — Z881 Allergy status to other antibiotic agents status: Secondary | ICD-10-CM

## 2022-12-07 DIAGNOSIS — Z23 Encounter for immunization: Secondary | ICD-10-CM

## 2022-12-07 DIAGNOSIS — E041 Nontoxic single thyroid nodule: Secondary | ICD-10-CM | POA: Diagnosis present

## 2022-12-07 DIAGNOSIS — Z818 Family history of other mental and behavioral disorders: Secondary | ICD-10-CM

## 2022-12-07 DIAGNOSIS — M79662 Pain in left lower leg: Secondary | ICD-10-CM | POA: Insufficient documentation

## 2022-12-07 DIAGNOSIS — C181 Malignant neoplasm of appendix: Secondary | ICD-10-CM | POA: Diagnosis not present

## 2022-12-07 DIAGNOSIS — E119 Type 2 diabetes mellitus without complications: Secondary | ICD-10-CM

## 2022-12-07 DIAGNOSIS — E66813 Obesity, class 3: Secondary | ICD-10-CM | POA: Diagnosis present

## 2022-12-07 DIAGNOSIS — M069 Rheumatoid arthritis, unspecified: Secondary | ICD-10-CM | POA: Diagnosis present

## 2022-12-07 DIAGNOSIS — R0602 Shortness of breath: Secondary | ICD-10-CM | POA: Diagnosis not present

## 2022-12-07 HISTORY — DX: Short bowel syndrome, unspecified: K90.829

## 2022-12-07 LAB — TROPONIN I (HIGH SENSITIVITY)
Troponin I (High Sensitivity): 4 ng/L (ref <18)
Troponin I (High Sensitivity): 3 ng/L (ref <18)

## 2022-12-07 LAB — CBC
HCT: 38 % (ref 36.0–46.0)
Hemoglobin: 12.3 g/dL (ref 12.0–15.0)
MCH: 31.3 pg (ref 26.0–34.0)
MCHC: 32.4 g/dL (ref 30.0–36.0)
MCV: 96.7 fL (ref 80.0–100.0)
Platelets: 258 10*3/uL (ref 150–400)
RBC: 3.93 MIL/uL (ref 3.87–5.11)
RDW: 14.6 % (ref 11.5–15.5)
WBC: 8.8 10*3/uL (ref 4.0–10.5)
nRBC: 0 % (ref 0.0–0.2)

## 2022-12-07 LAB — CBG MONITORING, ED: Glucose-Capillary: 175 mg/dL — ABNORMAL HIGH (ref 70–99)

## 2022-12-07 LAB — BASIC METABOLIC PANEL
Anion gap: 10 (ref 5–15)
BUN: 25 mg/dL — ABNORMAL HIGH (ref 6–20)
CO2: 24 mmol/L (ref 22–32)
Calcium: 8.9 mg/dL (ref 8.9–10.3)
Chloride: 105 mmol/L (ref 98–111)
Creatinine, Ser: 1.32 mg/dL — ABNORMAL HIGH (ref 0.44–1.00)
GFR, Estimated: 46 mL/min — ABNORMAL LOW (ref >60)
Glucose, Bld: 190 mg/dL — ABNORMAL HIGH (ref 70–99)
Potassium: 3.9 mmol/L (ref 3.5–5.1)
Sodium: 139 mmol/L (ref 135–145)

## 2022-12-07 LAB — PHOSPHORUS: Phosphorus: 4.6 mg/dL (ref 2.5–4.6)

## 2022-12-07 LAB — PROTIME-INR
INR: 1 (ref 0.8–1.2)
Prothrombin Time: 13.5 seconds (ref 11.4–15.2)

## 2022-12-07 LAB — CK: Total CK: 88 U/L (ref 38–234)

## 2022-12-07 LAB — LACTIC ACID, PLASMA
Lactic Acid, Venous: 1.5 mmol/L (ref 0.5–1.9)
Lactic Acid, Venous: 1.4 mmol/L (ref 0.5–1.9)

## 2022-12-07 LAB — MAGNESIUM: Magnesium: 2 mg/dL (ref 1.7–2.4)

## 2022-12-07 MED ORDER — PROPRANOLOL HCL 20 MG PO TABS
20.0000 mg | ORAL_TABLET | Freq: Two times a day (BID) | ORAL | Status: DC
  Administered 2022-12-07 – 2022-12-09 (×4): 20 mg via ORAL
  Filled 2022-12-07 (×4): qty 1

## 2022-12-07 MED ORDER — ALBUTEROL SULFATE (2.5 MG/3ML) 0.083% IN NEBU: 2.5000 mg | INHALATION_SOLUTION | RESPIRATORY_TRACT | Status: DC | PRN

## 2022-12-07 MED ORDER — ACETAMINOPHEN 325 MG PO TABS
650.0000 mg | ORAL_TABLET | Freq: Four times a day (QID) | ORAL | Status: DC | PRN
  Administered 2022-12-08: 650 mg via ORAL
  Filled 2022-12-07: qty 2

## 2022-12-07 MED ORDER — HEPARIN SOD (PORK) LOCK FLUSH 100 UNIT/ML IV SOLN
INTRAVENOUS | Status: AC
  Administered 2022-12-07: 500 [IU] via INTRAVENOUS
  Filled 2022-12-07: qty 5

## 2022-12-07 MED ORDER — BUSPIRONE HCL 5 MG PO TABS
15.0000 mg | ORAL_TABLET | Freq: Three times a day (TID) | ORAL | Status: DC
  Administered 2022-12-07 – 2022-12-09 (×5): 15 mg via ORAL
  Filled 2022-12-07 (×5): qty 3

## 2022-12-07 MED ORDER — HEPARIN SOD (PORK) LOCK FLUSH 100 UNIT/ML IV SOLN: 500.0000 [IU] | Freq: Once | INTRAVENOUS | Status: AC

## 2022-12-07 MED ORDER — ACETAMINOPHEN 650 MG RE SUPP: 650.0000 mg | Freq: Four times a day (QID) | RECTAL | Status: DC | PRN

## 2022-12-07 MED ORDER — PANTOPRAZOLE SODIUM 40 MG PO TBEC
40.0000 mg | DELAYED_RELEASE_TABLET | Freq: Every day | ORAL | Status: DC
  Administered 2022-12-08 – 2022-12-09 (×2): 40 mg via ORAL
  Filled 2022-12-07 (×2): qty 1

## 2022-12-07 MED ORDER — CITALOPRAM HYDROBROMIDE 20 MG PO TABS
40.0000 mg | ORAL_TABLET | Freq: Every day | ORAL | Status: DC
  Administered 2022-12-08 – 2022-12-09 (×2): 40 mg via ORAL
  Filled 2022-12-07 (×2): qty 2

## 2022-12-07 MED ORDER — IOHEXOL 350 MG/ML SOLN
100.0000 mL | Freq: Once | INTRAVENOUS | Status: AC | PRN
  Administered 2022-12-07: 54 mL via INTRAVENOUS

## 2022-12-07 MED ORDER — HEPARIN (PORCINE) 25000 UT/250ML-% IV SOLN
1300.0000 [IU]/h | INTRAVENOUS | Status: DC
  Administered 2022-12-07: 1450 [IU]/h via INTRAVENOUS
  Administered 2022-12-08 – 2022-12-09 (×2): 1300 [IU]/h via INTRAVENOUS
  Filled 2022-12-07 (×3): qty 250

## 2022-12-07 MED ORDER — SODIUM CHLORIDE 0.9 % IV SOLN: INTRAVENOUS | Status: AC

## 2022-12-07 MED ORDER — HYDROMORPHONE HCL 2 MG PO TABS: 4.0000 mg | ORAL_TABLET | Freq: Four times a day (QID) | ORAL | Status: DC | PRN

## 2022-12-07 MED ORDER — INSULIN ASPART 100 UNIT/ML IJ SOLN
0.0000 [IU] | INTRAMUSCULAR | Status: DC
  Administered 2022-12-08 (×2): 2 [IU] via SUBCUTANEOUS
  Administered 2022-12-09 (×2): 1 [IU] via SUBCUTANEOUS
  Filled 2022-12-07: qty 0.09

## 2022-12-07 MED ORDER — SODIUM CHLORIDE (PF) 0.9 % IJ SOLN
INTRAMUSCULAR | Status: AC
  Filled 2022-12-07: qty 50

## 2022-12-07 MED ORDER — LACTATED RINGERS IV SOLN: INTRAVENOUS | Status: DC

## 2022-12-07 MED ORDER — HYDROCODONE-ACETAMINOPHEN 5-325 MG PO TABS
1.0000 | ORAL_TABLET | ORAL | Status: DC | PRN
  Administered 2022-12-08 (×2): 2 via ORAL
  Filled 2022-12-07 (×3): qty 2

## 2022-12-07 NOTE — Assessment & Plan Note (Signed)
Need to follow-up with nutrition as an outpatient

## 2022-12-07 NOTE — Consult Note (Addendum)
Wendy Barber is very well known to me.  She is a very nice 62 year old white female.  She has a history of recurrent appendiceal carcinoma.  She has had treatment up to [facility].  Her last treatment was back in February 2022.  She had a really hard time with this.  She has a history of thromboembolic disease.  She had been on Lovenox.  We switch her over to Pradaxa in December 2023.  Just because she was having a hard time finding locations to give the injections.  She apparently began to have some problems with not feeling well.  She has some chest wall pain.  She had little bit of shortness of breath.  She subsequently underwent a CT angiogram.  The CT angiogram did show acute pulmonary emboli and a segmental branch in the right lower lobe.  She was started on heparin.  She had lab work done which showed a BUN 25 creatinine 1.32.  She has a high glucose of 190.  Her white cell count is 8.8.  Hemoglobin 12.3.  Platelet count 258,000.  She had Dopplers of her legs.  There may be a problem on the right side with an age indeterminant thrombus extending from the external iliac vein down into the right profunda vein.  She has chronic thrombus in both legs.  She has not had any leg pain.  She has had a lot of problems with diarrhea.  We try to get her to have a colostomy.  However, surgery does not feel that this would be helpful for her.  She has had no fever.  She has had no rashes.  There has been no urinary issues.  She is really not noted any leg swelling or pain.  Her vital signs show temperature of 98.2.  Pulse 73.  Blood pressure 107/66.  Her head and neck exam shows no ocular or oral lesions.  She has no adenopathy in the neck.  There is no scleral icterus.  Lungs are clear bilaterally.  She has good breath sounds bilaterally.  Cardiac exam is regular rate and rhythm.  Abdomen is obese but soft.  She has laparotomy scars.  There is no guarding or rebound tenderness.  There is no palpable  liver or spleen tip.  Extremity shows some chronic edema in the legs bilaterally.  There is no unilateral swelling.  She has no obvious venous cord.  Neurological exam shows no focal neurological deficits.   Wendy Barber is a very nice 62 year old white female.  She has recurrent thromboembolism.  She was only on the Pradaxa for a couple of months.  Clearly, she is going to need Lovenox.  She has not had Arixtra yet.  I just feel that she would do well with the Lovenox even though it is more difficult for her.  I think that heparin would be a good idea for her right now.  We may need to get Interventional Radiology involved to see about doing a thrombectomy with the right leg.  I think she has a filter in.  I would keep her on heparin for at least 2 days and then switch her over to Lovenox.  She is already had hypercoagulable studies done.  This clearly is idiopathic.  I do think it is somehow related to her past malignancy.  I do appreciate the outstanding care that she will get.  I know this is incredibly complicated.  [name], MD  Col  3:14

## 2022-12-07 NOTE — Progress Notes (Signed)
[place] for IV heparin Indication: pulmonary embolus  Allergies  Allergen Reactions   Penicillins Shortness Of Breath    Other reaction(s): Irregular Heart Rate, Other (See Comments) Rapid heartrate    Alprazolam Hives and Other (See Comments)    Hard to arouse, unresponsiveness   Ativan [Lorazepam] Other (See Comments)    Note: tolerates midazolam fine Face & Throat Swelling.   Corticosteroids Other (See Comments)    Other reaction(s): Other (see comments) Psychotic behaviour     Erythromycin     Other reaction(s): Other (See Comments) Severe stomach pain    Povidone-Iodine    Prednisone Other (See Comments)    Anxiety & Nervous Breakdown.   Savella  [Milnacipran]    Prednisolone Anxiety    Patient Measurements: Height: 5' 6"$  (167.6 cm) Weight: 109.3 kg (241 lb) IBW/kg (Calculated) : 59.3 Heparin Dosing Weight: 85 kg (used [place] nomogram for initial dosing)  Vital Signs: Temp: 98.1 F (36.7 C) (02/14 1556) Temp Source: Oral (02/14 1556) BP: 149/100 (02/14 1556) Pulse Rate: 77 (02/14 1556)  Labs: Recent Labs    12/06/22 1108 12/07/22 1610  HGB 12.7 12.3  HCT 38.9 38.0  PLT 274.0 258  LABPROT  --  13.5  INR  --  1.0  CREATININE 1.07 1.32*    Estimated Creatinine Clearance: 56.7 mL/min (A) (by C-G formula based on SCr of 1.32 mg/dL (H)).   Medical History: Past Medical History:  Diagnosis Date   Arthritis    Back pain    Cancer [facility])    pseudomyxoma peritonei   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[place]) 03/19/2020   Chronic fatigue syndrome    Diabetes mellitus without complication (HCC)    DVT of deep femoral vein, left ([place]) 03/19/2020   Fibromyalgia    Goals of care, counseling/discussion 03/19/2020   Hypertension    Iron deficiency anemia due to chronic blood loss 05/14/2021   Malignant pseudomyxoma peritonei ([place]) 03/19/2020   Pernicious anemia 05/14/2021   Presence of  IVC filter 03/19/2020   Pulmonary embolism, bilateral ([place]) 03/19/2020    Medications:  (Not in a hospital admission)  Scheduled:  PRN:   Assessment: 9 yoF with PMH appendiceal cancer followed by Dr. [name]; Hx DVT/PE with prior IVC filter placement, on Pradaxa PTA and. Presented to PCP with cough x 7d and CT revealed new segmental RLL PE without RH strain noted. Pharmacy consulted to transition patient to IV heparin pending new chronic anticoag plans. Patient initially prescribed Lovenox for VTE treatment Transitioned to Xarelto in 2023 but had to transition back to Lovenox after LGIB on Xarelto Since Dec 2023, patient has been on Pradaxa with (reportedly) good compliance   Baseline INR not elevated, aPTT not done Prior anticoagulation: dabigatran 110 mg PO bid, last dose 2/14 @ 06:00  Significant events:  Today, 12/07/2022: CBC: WNL SCr mildly elevated from yesterday; baseline ~1.0 No bleeding or infusion issues per nursing  Goal of Therapy: Heparin level 0.3-0.7 units/ml Monitor platelets by anticoagulation protocol: Yes  Plan: Heparin 1450 units/hr IV infusion Check heparin level 8 hrs after start Daily CBC, daily heparin level once stable Monitor for signs of bleeding or thrombosis F/u plans for chronic anticoagulation  Reuel Boom, PharmD, BCPS [phone] 12/07/2022, 5:36 PM

## 2022-12-07 NOTE — Assessment & Plan Note (Signed)
Obtain thyroid ultrasound to further evaluate

## 2022-12-07 NOTE — Assessment & Plan Note (Signed)
.    ER provided.Ennever is aware of admission

## 2022-12-07 NOTE — Telephone Encounter (Signed)
Not sure where to [facility] radiology to vascular. I have reordered for [facility], as it appears ultrasound is planned at that location. Let me know if I need to adjust order further.

## 2022-12-07 NOTE — Progress Notes (Signed)
BLE venous duplex has been completed.  Preliminary findings called to Dr. Vonna Kotyk office.  Instructed to have patient go to ED due to PE seen on CT.    Results can be found under chart review under CV PROC. 12/07/2022 11:09 AM Kylee Umana RVT, RDMS

## 2022-12-07 NOTE — Assessment & Plan Note (Addendum)
Admit to   telemetry   Initiate heparin drip in the setting of patient already being on Pradaxa appears to be failure.  Patient already status post IVC filter.  Hematology oncology aware will see patient in consult and can decide on further anticoagulation therapy likely will need to go back to lovenox   Obtain troponin Order echogram  Most likely risk factors for hypercoagulable state being  malignancy

## 2022-12-07 NOTE — Assessment & Plan Note (Addendum)
vascular surgery has rec IR consult heparin drip for right now

## 2022-12-07 NOTE — ED Provider Notes (Signed)
[place] EMERGENCY DEPARTMENT AT [facility] Provider Note   CSN: [id number] Arrival date & time: 12/07/22  1525     History  Chief Complaint  Patient presents with   Pulmonary Embolism    Wendy Barber is a 62 y.o. female.  HPI Patient has history of bilateral lower extremity DVT she has an IVC filter in place and takes Pradaxa regularly.  The patient was seen by her PCP yesterday and was having worsening shortness of breath in conjunction with a cough and some small amount of sputum no fever.  However she was feeling weak and dizzy at times and trouble catching her breath.  Patient was referred for outpatient CT PE study.  PE study was returned positive for segmental pulmonary embolus without cor pulmonale.  Patient also had DVT study done which showed increased DVT burden.  Patient was referred to the emergency department for admission.  She denies she is having any chest pain at this time.  At rest she is not feeling significantly short of breath.    Home Medications Prior to Admission medications   Medication Sig Start Date End Date Taking? Authorizing Provider  amLODipine (NORVASC) 5 MG tablet TAKE 1 TABLET(5 MG) BY MOUTH DAILY Patient taking differently: Take 5 mg by mouth daily. 11/09/22  Yes [name], MD  buPROPion (WELLBUTRIN XL) 300 MG 24 hr tablet TAKE 1 TABLET(300 MG) BY MOUTH DAILY Patient taking differently: Take 300 mg by mouth in the morning. 11/28/22  Yes White, Aaron Edelman A, NP  busPIRone (BUSPAR) 15 MG tablet Take 1 tablet (15 mg total) by mouth 3 (three) times daily. 04/05/22  Yes Lesle Chris A, NP  citalopram (CELEXA) 40 MG tablet Take 1 tablet (40 mg total) by mouth daily. 09/21/22  Yes Lesle Chris A, NP  cyanocobalamin 1000 MCG tablet Take 1 tablet (1,000 mcg total) by mouth daily. 07/26/22  Yes Cherene Altes, MD  Dabigatran Etexilate Mesylate (PRADAXA) 110 MG CAPS Take 110 mg by mouth 2 (two) times daily. 10/12/22  Yes Ennever, Rudell Cobb, MD   dicyclomine (BENTYL) 20 MG tablet TAKE 1 TABLET(20 MG) BY MOUTH THREE TIMES DAILY BEFORE MEALS Patient taking differently: Take 20 mg by mouth 3 (three) times daily before meals. 10/25/22  Yes Ennever, Rudell Cobb, MD  diphenoxylate-atropine (LOMOTIL) 2.5-0.025 MG tablet TAKE 2 TABLETS IN THE MORNING, AT NOON, IN THE EVENING AND AT BEDTIME 04/22/22  Yes Ennever, Rudell Cobb, MD  eszopiclone (LUNESTA) 2 MG TABS tablet TAKE 1 TABLET(2 MG) BY MOUTH AT BEDTIME AS NEEDED FOR SLEEP Patient taking differently: Take 2 mg by mouth at bedtime. 09/21/22  Yes Lesle Chris A, NP  ferrous sulfate 325 (65 FE) MG tablet Take 1 tablet (325 mg total) by mouth daily. 06/03/22 06/03/23 Yes Shah, Pratik D, DO  hydrocortisone (ANUSOL-HC) 2.5 % rectal cream Place rectally 4 (four) times daily as needed for hemorrhoids or anal itching. Use 4 times daily x5 days, then 4 times daily as needed. 06/26/22  Yes Eugenie Filler, MD  HYDROmorphone (DILAUDID) 4 MG tablet Take 1 tablet (4 mg total) by mouth every 6 (six) hours as needed for severe pain. 10/12/22  Yes Ennever, Rudell Cobb, MD  loperamide (IMODIUM) 2 MG capsule Take 2 capsules (4 mg total) by mouth 4 (four) times daily as needed for diarrhea or loose stools. Patient taking differently: Take 4 mg by mouth in the morning, at noon, in the evening, and at bedtime. 05/08/22  Yes Aline August, MD  meclizine (ANTIVERT) 25 MG tablet TAKE 1 TABLET(25 MG) BY MOUTH THREE TIMES DAILY AS NEEDED FOR DIZZINESS Patient taking differently: Take 25 mg by mouth in the morning and at bedtime. 11/28/22  Yes [name], MD  ondansetron (ZOFRAN) 8 MG tablet Take 8 mg by mouth See admin instructions. Am yes and 2 day if neneced 03/03/21  Yes [provider]  orphenadrine (NORFLEX) 100 MG tablet TAKE 1 TABLET(100 MG) BY MOUTH AT BEDTIME AS NEEDED FOR MUSCLE SPASMS Patient taking differently: Take 100 mg by mouth at bedtime. 10/10/22  Yes Ennever, Rudell Cobb, MD  pantoprazole (PROTONIX) 40 MG  tablet Take 40 mg by mouth daily before breakfast.   Yes [provider]  promethazine (PHENERGAN) 12.5 MG tablet TAKE 1 TABLET(12.5 MG) BY MOUTH TWICE DAILY AS NEEDED Patient taking differently: Take 12.5 mg by mouth See admin instructions. 12.5 ha and day if needed 10/26/22  Yes [name], MD  propranolol (INDERAL) 20 MG tablet TAKE 1 TABLET(20 MG) BY MOUTH TWICE DAILY Patient taking differently: Take 20 mg by mouth in the morning and at bedtime. 10/25/22  Yes [name], MD  famotidine (PEPCID) 20 MG tablet TAKE 2 TABLETS TWICE A DAY Patient not taking: Reported on 12/07/2022 03/01/22   Volanda Napoleon, MD  potassium chloride (KLOR-CON M) 10 MEQ tablet Take 2 tablets (20 mEq total) by mouth daily. Patient not taking: Reported on 12/07/2022 07/07/22   [name], MD  pramoxine-hydrocortisone (PROCTOCREAM-HC) 1-1 % rectal cream Place 1 Application rectally 2 (two) times daily. 07/04/22   Volanda Napoleon, MD      Allergies    Penicillins, Alprazolam, Ativan [lorazepam], Corticosteroids, Erythromycin, Gabapentin, Prednisone, Savella  [milnacipran], and Prednisolone    Review of Systems   Review of Systems  Physical Exam Updated Vital Signs BP (!) 144/92 (BP Location: Right Arm)   Pulse 73   Temp 98.1 F (36.7 C) (Oral)   Resp 18   Ht 5' 6"$  (1.676 m)   Wt 109.3 kg   SpO2 98%   BMI 38.90 kg/m  Physical Exam Constitutional:      Appearance: Normal appearance.  HENT:     Mouth/Throat:     Pharynx: Oropharynx is clear.  Eyes:     Extraocular Movements: Extraocular movements intact.  Cardiovascular:     Rate and Rhythm: Normal rate and regular rhythm.  Pulmonary:     Comments: Mild tachypnea.  Slight crackle at the left base.  Intermittent moist sounding cough. Abdominal:     General: There is no distension.     Palpations: Abdomen is soft.     Tenderness: There is no abdominal tenderness. There is no guarding.  Musculoskeletal:     Comments: No Lower  extremity edema.  Calves are soft and nontender.  Skin:    General: Skin is warm and dry.  Neurological:     General: No focal deficit present.     Mental Status: She is alert and oriented to person, place, and time.     Motor: No weakness.     Coordination: Coordination normal.  Psychiatric:        Mood and Affect: Mood normal.     ED Results / Procedures / Treatments   Labs (all labs ordered are listed, but only abnormal results are displayed) Labs Reviewed  BASIC METABOLIC PANEL - Abnormal; Notable for the following components:      Result Value   Glucose, Bld 190 (*)  BUN 25 (*)    Creatinine, Ser 1.32 (*)    GFR, Estimated 46 (*)    All other components within normal limits  PROTIME-INR  CBC  LACTIC ACID, PLASMA  BRAIN NATRIURETIC PEPTIDE  LACTIC ACID, PLASMA  HEPARIN LEVEL (UNFRACTIONATED)  CBC  CK  PHOSPHORUS  MAGNESIUM  TSH  URINALYSIS, COMPLETE (UACMP) WITH MICROSCOPIC  TROPONIN I (HIGH SENSITIVITY)  TROPONIN I (HIGH SENSITIVITY)    EKG EKG Interpretation  Date/Time:  Wednesday December 07 2022 17:21:41 EST Ventricular Rate:  73 PR Interval:  180 QRS Duration: 116 QT Interval:  423 QTC Calculation: 467 R Axis:   -6 Text Interpretation: Sinus rhythm Incomplete right bundle branch block Low voltage, precordial leads Similar to Sep 2023 tracing Confirmed by Nanda Quinton [phone]) on 12/07/2022 5:27:47 PM  Radiology VAS [place] LOWER EXTREMITY VENOUS (DVT)  Result Date: 12/07/2022  Lower Venous DVT Study Patient Name:  SUZETH WALENTA  Date of Exam:   12/07/2022 Medical Rec #: [id number]       Accession #:    [id number] Date of Birth: 08-Mar-1961       Patient Gender: F Patient Age:   37 years Exam Location:  [facility] Procedure:      VAS [place] LOWER EXTREMITY VENOUS (DVT) Referring Phys: JEFFREY GREENE --------------------------------------------------------------------------------  Indications: Pain.  Risk Factors: Hx of DVT & PE (IVC filter in place)  Recurrent appendiceal cancer. Anticoagulation: Pradaxa. Limitations: Poor ultrasound/tissue interface. Comparison Study: Previous exam on 07/28/22 was positive for chronic DVT in BLE. Performing Technologist: Rogelia Rohrer RVT, RDMS  Examination Guidelines: A complete evaluation includes B-mode imaging, spectral Doppler, color Doppler, and power Doppler as needed of all accessible portions of each vessel. Bilateral testing is considered an integral part of a complete examination. Limited examinations for reoccurring indications may be performed as noted. The reflux portion of the exam is performed with the patient in reverse Trendelenburg.  +---------+---------------+---------+-----------+----------+-----------------+ RIGHT    CompressibilityPhasicitySpontaneityPropertiesThrombus Aging    +---------+---------------+---------+-----------+----------+-----------------+ CFV      None           No       No                   Age Indeterminate +---------+---------------+---------+-----------+----------+-----------------+ SFJ      Partial                                      Age Indeterminate +---------+---------------+---------+-----------+----------+-----------------+ FV Prox  Full           No       Yes                                    +---------+---------------+---------+-----------+----------+-----------------+ FV Mid   Full           Yes      Yes                                    +---------+---------------+---------+-----------+----------+-----------------+ FV DistalFull           Yes      Yes                                    +---------+---------------+---------+-----------+----------+-----------------+  PFV      None           No       No                   Age Indeterminate +---------+---------------+---------+-----------+----------+-----------------+ POP      Partial        No       Yes                  Chronic            +---------+---------------+---------+-----------+----------+-----------------+ PTV      Full                                                           +---------+---------------+---------+-----------+----------+-----------------+ PERO     Full                                                           +---------+---------------+---------+-----------+----------+-----------------+ EIV      Partial        No       No                   Age Indeterminate +---------+---------------+---------+-----------+----------+-----------------+ Proximal portion of EIV appears patent by color and doppler  +---------+---------------+---------+-----------+----------+--------------+ LEFT     CompressibilityPhasicitySpontaneityPropertiesThrombus Aging +---------+---------------+---------+-----------+----------+--------------+ CFV      Full           Yes      Yes                                 +---------+---------------+---------+-----------+----------+--------------+ SFJ      Partial                                      Chronic        +---------+---------------+---------+-----------+----------+--------------+ FV Prox  Full           Yes      Yes                                 +---------+---------------+---------+-----------+----------+--------------+ FV Mid   Full           Yes      Yes                                 +---------+---------------+---------+-----------+----------+--------------+ FV DistalFull           Yes      Yes                                 +---------+---------------+---------+-----------+----------+--------------+ PFV      Full                                                        +---------+---------------+---------+-----------+----------+--------------+  POP      Full           Yes      Yes                                 +---------+---------------+---------+-----------+----------+--------------+ PTV      Full                                                         +---------+---------------+---------+-----------+----------+--------------+ PERO     Full                                                        +---------+---------------+---------+-----------+----------+--------------+     Summary: BILATERAL: -No evidence of popliteal cyst, bilaterally. RIGHT: - Findings consistent with age indeterminate deep vein thrombosis involving the EIV, right common femoral vein, SF junction, and right proximal profunda vein. - Findings consistent with chronic deep vein thrombosis involving the right popliteal vein.  LEFT: - Findings consistent with chronic deep vein thrombosis involving the SF junction.  *See table(s) above for measurements and observations. Electronically signed by Monica Martinez MD on 12/07/2022 at 11:14:20 AM.    Final    CT Angio Chest W/Cm &/Or Wo Cm  Result Date: 12/07/2022 CLINICAL DATA:  Rule out pulmonary embolism. Dyspnea. Weakness. History of DVT. EXAM: CT ANGIOGRAPHY CHEST WITH CONTRAST TECHNIQUE: Multidetector CT imaging of the chest was performed using the standard protocol during bolus administration of intravenous contrast. Multiplanar CT image reconstructions and MIPs were obtained to evaluate the vascular anatomy. RADIATION DOSE REDUCTION: This exam was performed according to the departmental dose-optimization program which includes automated exposure control, adjustment of the mA and/or kV according to patient size and/or use of iterative reconstruction technique. CONTRAST:  64m OMNIPAQUE IOHEXOL 350 MG/ML SOLN COMPARISON:  10/07/2022. FINDINGS: Cardiovascular: Satisfactory opacification of the pulmonary arteries. There is a filling defect within the segmental branch of the right lower lobe pulmonary artery, image 58/11 and image 157/6. No signs of right heart strain. Heart size is normal. No pericardial effusion. Aortic atherosclerosis and coronary artery calcifications. Mediastinum/Nodes: No enlarged  mediastinal, hilar, or axillary lymph nodes. Nodule arising off the lower pole of right lobe of thyroid gland measures 1.7 cm, image 41/8. Recommend thyroid [place](ref: J Am Coll Radiol. 2015 Feb;12(2): 143-50). The trachea and esophagus demonstrate no significant findings. Lungs/Pleura: No pleural fluid. No airspace disease. Mild scar versus subsegmental atelectasis identified in the lateral right lower lobe. No suspicious pulmonary nodule or mass identified. Upper Abdomen: No acute abnormality.  Previous cholecystectomy. Musculoskeletal: Status post bilateral shoulder arthroplasty. Left chest wall port a catheter identified. No acute or suspicious osseous findings. Review of the MIP images confirms the above findings. IMPRESSION: 1. Examination is positive for acute pulmonary embolus within a segmental branch of the right lower lobe pulmonary artery. No signs of right heart strain. 2. Coronary artery calcifications noted. 3. 1.7 cm right lobe of thyroid nodule. Recommend thyroid [place] 4.  Aortic Atherosclerosis (ICD10-I70.0). Electronically Signed   By: TKerby MoorsM.D.   On: 12/07/2022 09:11    Procedures Procedures  CRITICAL CARE Performed by: Charlesetta Shanks   Total critical care time: 30 minutes  Critical care time was exclusive of separately billable procedures and treating other patients.  Critical care was necessary to treat or prevent imminent or life-threatening deterioration.  Critical care was time spent personally by me on the following activities: development of treatment plan with patient and/or surrogate as well as nursing, discussions with consultants, evaluation of patient's response to treatment, examination of patient, obtaining history from patient or surrogate, ordering and performing treatments and interventions, ordering and review of laboratory studies, ordering and review of radiographic studies, pulse oximetry and re-evaluation of patient's condition.  Medications Ordered in  ED Medications  heparin ADULT infusion 100 units/mL (25000 units/223m) (1,450 Units/hr Intravenous New Bag/Given 12/07/22 1824)  lactated ringers infusion (has no administration in time range)    ED Course/ Medical Decision Making/ A&P                             Medical Decision Making Amount and/or Complexity of Data Reviewed Labs: ordered.  Risk Prescription drug management. Decision regarding hospitalization.   Patient is referred with a established diagnosis of a CT today of PE on Pradaxa and IVC filter.  Patient has high proximal DVT questionable by radiology interpretation whether acute.  She also has extensive chronic DVT.  Patient is mildly symptomatic with dyspnea but she is not hypoxic at rest and blood pressures are stable.  Heparin initiated for failed treatment with Pradaxa on outpatient basis.  Patient was referred to come to emergency department by Dr. EMarin Olpand Dr. GNyoka Cowden  Reviewed with Dr. EMarin Olpwith plan to admit on heparin and he will do consult in the morning.  Consult: Reviewed with Dr. DDarnell Levelfor admission.  Requests consultation to vascular surgery to see if there is any plan for thrombectomy.          Final Clinical Impression(s) / ED Diagnoses Final diagnoses:  Single subsegmental pulmonary embolism without acute cor pulmonale ([place]    Rx / DC Orders ED Discharge Orders     None         PCharlesetta Shanks MD 12/07/22 1929

## 2022-12-07 NOTE — ED Triage Notes (Signed)
Patient arrives ambulatory by POV with significant other. Patient states she had a CT scan done this morning showing right lower PE and had [place] of legs showing multiple DVTs. Patient reports having cough x 2 weeks.

## 2022-12-07 NOTE — Assessment & Plan Note (Signed)
Chronic-stable.

## 2022-12-07 NOTE — Assessment & Plan Note (Signed)
Order sliding scale  

## 2022-12-07 NOTE — H&P (Signed)
Wendy Barber [id number] DOB: 1961-05-06 DOA: 12/07/2022     PCP: [name], MD   Outpatient Specialists:        Oncology  Dr. [name]    Patient arrived to ER on 12/07/22 at 1525 Referred by Attending Charlesetta Shanks, MD    Patient coming from:    home Lives   With family    Chief Complaint:   Chief Complaint  Patient presents with   Pulmonary Embolism    HPI: Wendy Barber is a 62 y.o. female with medical history significant of appendiceal cancer with metastasis, SMV thrombus, IVC filter, essential tremors, history of bilateral DVTs, CAD, history of GI bleed, prior history of PE in July 2023, pernicious anemia, DM2, autoimmune hepatitis, hypertension, morbid obesity, GERD rheumatoid arthritis    Presented with   CTA findings of PE in the setting of Pradaxa  Patient has been having cough and sore throat for the past 5 days negative for COVID on 12 February.  But has been having significant shortness of breath and no fever.  Has been having more dizzy and generally weak. Patient known history of bilateral DVTs used to be on Lovenox now on Pradaxa reports good compliance she still have occasional left leg pain. She has history of appendiceal cancer fib SMV thrombus and known IVC filter She was seen by P CP yesterday had repeat COVID flu and RSV testing which was negative CT a was ordered and an ultrasound of the left leg. CTA showed active PE and patient was told to go to emergency department Dopplers of the left leg showed multiple DVTs    Does not smoke or drink   Regarding pertinent Chronic problems:       HTN on Norvasc,    CAD  - On   betablocker,                     DM 2 -  Lab Results  Component Value Date   HGBA1C 6.5 (H) 06/01/2022    diet controlled  Essential tremor for propranolol    obesity-   BMI Readings from Last 1 Encounters:  12/07/22 38.90 kg/m      Hx of DVT/PE on - anticoagulation with pradaxa   CKD stage IIIa- baseline Cr  1.3 Estimated Creatinine Clearance: 56.7 mL/min (A) (by C-G formula based on SCr of 1.32 mg/dL (H)).  Lab Results  Component Value Date   CREATININE 1.32 (H) 12/07/2022   CREATININE 1.07 12/06/2022   CREATININE 1.45 (H) 10/12/2022    While in ER:   Was seen by Dr. [name] in ER     CTA chest - . Examination is positive for acute pulmonary embolus within a segmental branch of the right lower lobe pulmonary artery. No signs of right heart strain  Coronary artery calcifications noted. 3. 1.7 cm right lobe of thyroid nodule. Recommend thyroid US. Following Medications were ordered in ER: Medications  heparin ADULT infusion 100 units/mL (25000 units/22m) (1,450 Units/hr Intravenous New Bag/Given 12/07/22 1824)  lactated ringers infusion (has no administration in time range)      ED Triage Vitals  Enc Vitals Group     BP 12/07/22 1531 138/82     Pulse Rate 12/07/22 1531 91     Resp 12/07/22 1531 20     Temp 12/07/22 1531 98.2 F (36.8 C)     Temp Source 12/07/22 1531 Oral     SpO2 12/07/22 1531 96 %  Weight 12/07/22 1532 241 lb (109.3 kg)     Height 12/07/22 1532 5' 6"$  (1.676 m)     Head Circumference --      Peak Flow --      Pain Score 12/07/22 1532 4     Pain Loc --      Pain Edu? --      Excl. in [place]? --   TMAX(24)@     _________________________________________ Significant initial  Findings: Abnormal Labs Reviewed  BASIC METABOLIC PANEL - Abnormal; Notable for the following components:      Result Value   Glucose, Bld 190 (*)    BUN 25 (*)    Creatinine, Ser 1.32 (*)    GFR, Estimated 46 (*)    All other components within normal limits     _________________________ Troponin  ordered ECG: Ordered Personally reviewed and interpreted by me showing: HR : 73 Rhythm: Sinus rhythm Incomplete right bundle branch block Low voltage, precordial leads QTC 467    The recent clinical data is shown below. Vitals:   12/07/22 1532 12/07/22 1556 12/07/22 1600  12/07/22 1630  BP:  (!) 149/100 (!) 150/96 (!) 144/92  Pulse:  77 77 73  Resp:  16 16 18  $ Temp:  98.1 F (36.7 C)    TempSrc:  Oral    SpO2:  97% 98%   Weight: 109.3 kg     Height: 5' 6"$  (1.676 m)       WBC     Component Value Date/Time   WBC 8.8 12/07/2022 1610   LYMPHSABS 3.0 10/12/2022 0945   MONOABS 0.9 10/12/2022 0945   EOSABS 0.2 10/12/2022 0945   BASOSABS 0.1 10/12/2022 0945    Lactic Acid, Venous    Component Value Date/Time   LATICACIDVEN 1.5 12/07/2022 1750      UA   ordered     Results for orders placed or performed in visit on 10/12/22  C difficile quick screen w PCR reflex     Status: Abnormal   Collection Time: 10/12/22 12:52 PM   Specimen: STOOL  Result Value Ref Range Status   C Diff antigen POSITIVE (A) NEGATIVE Final   C Diff toxin NEGATIVE NEGATIVE Final   C Diff interpretation Results are indeterminate. See PCR results.  Final    Comment: Performed at [facility], [place] [address], [place], [place] [address]  C. Diff by PCR, Reflexed     Status: Abnormal   Collection Time: 10/12/22 12:52 PM  Result Value Ref Range Status   Toxigenic C. Difficile by PCR POSITIVE (A) NEGATIVE Final    Comment: Positive for toxigenic C. difficile with little to no toxin production. Only treat if clinical presentation suggests symptomatic illness. Performed at [facility], [place] [address], [place], [place] [address]      _______________________________________________ Hospitalist was called for admission for   Single subsegmental pulmonary embolism without acute cor pulmonale ([place])     The following Work up has been ordered so far:  Orders Placed This Encounter  Procedures   Basic metabolic panel   Protime-INR   CBC   Brain natriuretic peptide   Lactic acid, plasma   Heparin level (unfractionated)   CBC   Cardiac monitoring   Consult to intensivist   heparin per pharmacy consult   Consult to oncology   Consult to hospitalist    ED EKG   Saline lock IV     OTHER Significant initial  Findings:  labs showing:  Recent Labs  Lab 12/06/22 1108 12/07/22 1610  NA 140 139  K 3.9 3.9  CO2 30 24  GLUCOSE 180* 190*  BUN 21 25*  CREATININE 1.07 1.32*  CALCIUM 9.6 8.9    Cr    stable from baseline see below Lab Results  Component Value Date   CREATININE 1.32 (H) 12/07/2022   CREATININE 1.07 12/06/2022   CREATININE 1.45 (H) 10/12/2022    No results for input(s): "AST", "ALT", "ALKPHOS", "BILITOT", "PROT", "ALBUMIN" in the last 168 hours. Lab Results  Component Value Date   CALCIUM 8.9 12/07/2022   PHOS 3.9 02/07/2021    Plt: Lab Results  Component Value Date   PLT 258 12/07/2022        Recent Labs  Lab 12/06/22 1108 12/07/22 1610  WBC 8.6 8.8  HGB 12.7 12.3  HCT 38.9 38.0  MCV 94.6 96.7  PLT 274.0 258    HG/HCT   stable,     Component Value Date/Time   HGB 12.3 12/07/2022 1610   HGB 14.2 10/12/2022 0945   HCT 38.0 12/07/2022 1610   MCV 96.7 12/07/2022 1610      Cardiac Panel (last 3 results) No results for input(s): "CKTOTAL", "CKMB", "TROPONINI", "RELINDX" in the last 72 hours.  .car BNP (last 3 results) No results for input(s): "BNP" in the last 8760 hours.    DM  labs:  HbA1C: Recent Labs    06/01/22 0520  HGBA1C 6.5*    CBG (last 3)  No results for input(s): "GLUCAP" in the last 72 hours.     Cultures:    Component Value Date/Time   SDES URINE, CLEAN CATCH 07/23/2022 0303   SPECREQUEST  07/23/2022 0303    NONE Performed at [facility] [address], [place],  [address]    CULT >=100,000 COLONIES/mL ESCHERICHIA COLI (A) 07/23/2022 0303   REPTSTATUS 07/25/2022 FINAL 07/23/2022 0303     Radiological Exams on Admission: VAS [place] LOWER EXTREMITY VENOUS (DVT)  Result Date: 12/07/2022  Lower Venous DVT Study Patient Name:  JORY EMMETT  Date of Exam:   12/07/2022 Medical Rec #: [id number]       Accession #:    [id number] Date of Birth: 1960/11/16        Patient Gender: F Patient Age:   29 years Exam Location:  [facility] Procedure:      VAS [place] LOWER EXTREMITY VENOUS (DVT) Referring Phys: JEFFREY GREENE --------------------------------------------------------------------------------  Indications: Pain.  Risk Factors: Hx of DVT & PE (IVC filter in place) Recurrent appendiceal cancer. Anticoagulation: Pradaxa. Limitations: Poor ultrasound/tissue interface. Comparison Study: Previous exam on 07/28/22 was positive for chronic DVT in BLE. Performing Technologist: Rogelia Rohrer RVT, RDMS  Examination Guidelines: A complete evaluation includes B-mode imaging, spectral Doppler, color Doppler, and power Doppler as needed of all accessible portions of each vessel. Bilateral testing is considered an integral part of a complete examination. Limited examinations for reoccurring indications may be performed as noted. The reflux portion of the exam is performed with the patient in reverse Trendelenburg.  +---------+---------------+---------+-----------+----------+-----------------+ RIGHT    CompressibilityPhasicitySpontaneityPropertiesThrombus Aging    +---------+---------------+---------+-----------+----------+-----------------+ CFV      None           No       No                   Age Indeterminate +---------+---------------+---------+-----------+----------+-----------------+ SFJ      Partial  Age Indeterminate +---------+---------------+---------+-----------+----------+-----------------+ FV Prox  Full           No       Yes                                    +---------+---------------+---------+-----------+----------+-----------------+ FV Mid   Full           Yes      Yes                                    +---------+---------------+---------+-----------+----------+-----------------+ FV DistalFull           Yes      Yes                                     +---------+---------------+---------+-----------+----------+-----------------+ PFV      None           No       No                   Age Indeterminate +---------+---------------+---------+-----------+----------+-----------------+ POP      Partial        No       Yes                  Chronic           +---------+---------------+---------+-----------+----------+-----------------+ PTV      Full                                                           +---------+---------------+---------+-----------+----------+-----------------+ PERO     Full                                                           +---------+---------------+---------+-----------+----------+-----------------+ EIV      Partial        No       No                   Age Indeterminate +---------+---------------+---------+-----------+----------+-----------------+ Proximal portion of EIV appears patent by color and doppler  +---------+---------------+---------+-----------+----------+--------------+ LEFT     CompressibilityPhasicitySpontaneityPropertiesThrombus Aging +---------+---------------+---------+-----------+----------+--------------+ CFV      Full           Yes      Yes                                 +---------+---------------+---------+-----------+----------+--------------+ SFJ      Partial                                      Chronic        +---------+---------------+---------+-----------+----------+--------------+ FV Prox  Full           Yes      Yes                                 +---------+---------------+---------+-----------+----------+--------------+  FV Mid   Full           Yes      Yes                                 +---------+---------------+---------+-----------+----------+--------------+ FV DistalFull           Yes      Yes                                 +---------+---------------+---------+-----------+----------+--------------+ PFV      Full                                                         +---------+---------------+---------+-----------+----------+--------------+ POP      Full           Yes      Yes                                 +---------+---------------+---------+-----------+----------+--------------+ PTV      Full                                                        +---------+---------------+---------+-----------+----------+--------------+ PERO     Full                                                        +---------+---------------+---------+-----------+----------+--------------+     Summary: BILATERAL: -No evidence of popliteal cyst, bilaterally. RIGHT: - Findings consistent with age indeterminate deep vein thrombosis involving the EIV, right common femoral vein, SF junction, and right proximal profunda vein. - Findings consistent with chronic deep vein thrombosis involving the right popliteal vein.  LEFT: - Findings consistent with chronic deep vein thrombosis involving the SF junction.  *See table(s) above for measurements and observations. Electronically signed by Monica Martinez MD on 12/07/2022 at 11:14:20 AM.    Final    CT Angio Chest W/Cm &/Or Wo Cm  Result Date: 12/07/2022 CLINICAL DATA:  Rule out pulmonary embolism. Dyspnea. Weakness. History of DVT. EXAM: CT ANGIOGRAPHY CHEST WITH CONTRAST TECHNIQUE: Multidetector CT imaging of the chest was performed using the standard protocol during bolus administration of intravenous contrast. Multiplanar CT image reconstructions and MIPs were obtained to evaluate the vascular anatomy. RADIATION DOSE REDUCTION: This exam was performed according to the departmental dose-optimization program which includes automated exposure control, adjustment of the mA and/or kV according to patient size and/or use of iterative reconstruction technique. CONTRAST:  76m OMNIPAQUE IOHEXOL 350 MG/ML SOLN COMPARISON:  10/07/2022. FINDINGS: Cardiovascular: Satisfactory opacification of the  pulmonary arteries. There is a filling defect within the segmental branch of the right lower lobe pulmonary artery, image 58/11 and image 157/6. No signs of right heart strain. Heart size is normal. No pericardial effusion. Aortic atherosclerosis and coronary artery calcifications. Mediastinum/Nodes: No enlarged  mediastinal, hilar, or axillary lymph nodes. Nodule arising off the lower pole of right lobe of thyroid gland measures 1.7 cm, image 41/8. Recommend thyroid US (ref: J Am Coll Radiol. 2015 Feb;12(2): 143-50). The trachea and esophagus demonstrate no significant findings. Lungs/Pleura: No pleural fluid. No airspace disease. Mild scar versus subsegmental atelectasis identified in the lateral right lower lobe. No suspicious pulmonary nodule or mass identified. Upper Abdomen: No acute abnormality.  Previous cholecystectomy. Musculoskeletal: Status post bilateral shoulder arthroplasty. Left chest wall port a catheter identified. No acute or suspicious osseous findings. Review of the MIP images confirms the above findings. IMPRESSION: 1. Examination is positive for acute pulmonary embolus within a segmental branch of the right lower lobe pulmonary artery. No signs of right heart strain. 2. Coronary artery calcifications noted. 3. 1.7 cm right lobe of thyroid nodule. Recommend thyroid US. 4.  Aortic Atherosclerosis (ICD10-I70.0). Electronically Signed   By: Kerby Moors M.D.   On: 12/07/2022 09:11   _______________________________________________________________________________________________________ Latest  Blood pressure (!) 144/92, pulse 73, temperature 98.1 F (36.7 C), temperature source Oral, resp. rate 18, height 5' 6"$  (1.676 m), weight 109.3 kg, SpO2 98 %.   Vitals  labs and radiology finding personally reviewed  Review of Systems:    Pertinent positives include:  fatigue,  Constitutional:  No weight loss, night sweats, Fevers, chills,  weight loss  HEENT:  No headaches, Difficulty  swallowing,Tooth/dental problems,Sore throat,  No sneezing, itching, ear ache, nasal congestion, post nasal drip,  Cardio-vascular:  No chest pain, Orthopnea, PND, anasarca, dizziness, palpitations.no Bilateral lower extremity swelling  GI:  No heartburn, indigestion, abdominal pain, nausea, vomiting, diarrhea, change in bowel habits, loss of appetite, melena, blood in stool, hematemesis Resp:  no shortness of breath at rest. No dyspnea on exertion, No excess mucus, no productive cough, No non-productive cough, No coughing up of blood.No change in color of mucus.No wheezing. Skin:  no rash or lesions. No jaundice GU:  no dysuria, change in color of urine, no urgency or frequency. No straining to urinate.  No flank pain.  Musculoskeletal:  No joint pain or no joint swelling. No decreased range of motion. No back pain.  Psych:  No change in mood or affect. No depression or anxiety. No memory loss.  Neuro: no localizing neurological complaints, no tingling, no weakness, no double vision, no gait abnormality, no slurred speech, no confusion  All systems reviewed and apart from [place] all are negative _______________________________________________________________________________________________ Past Medical History:   Past Medical History:  Diagnosis Date   Arthritis    Back pain    Cancer ([place])    pseudomyxoma peritonei   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[place]) 03/19/2020   Chronic fatigue syndrome    Diabetes mellitus without complication (HCC)    DVT of deep femoral vein, left ([place]) 03/19/2020   Fibromyalgia    Goals of care, counseling/discussion 03/19/2020   Hypertension    Iron deficiency anemia due to chronic blood loss 05/14/2021   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   Pernicious anemia 05/14/2021   Presence of IVC filter 03/19/2020   Pulmonary embolism, bilateral ([place]) 03/19/2020    Past Surgical History:  Procedure Laterality Date    ABDOMINAL HYSTERECTOMY     ABDOMINAL SURGERY     ACHILLES TENDON REPAIR     APPENDECTOMY     ARTHROPLASTY     CARPAL TUNNEL RELEASE     CHOLECYSTECTOMY     IR CATHETER TUBE CHANGE  02/02/2021   IR  CATHETER TUBE CHANGE  02/25/2021   IR IMAGING GUIDED PORT INSERTION  06/20/2022   IR RADIOLOGIST EVAL & MGMT  02/24/2021   IR RADIOLOGIST EVAL & MGMT  03/10/2021   IR RADIOLOGIST EVAL & MGMT  06/22/2022   IR THROMBECT VENO MECH MOD SED  06/20/2022   IR US GUIDE BX ASP/DRAIN  11/25/2019   IR US GUIDE VASC ACCESS LEFT  06/20/2022   IR US GUIDE VASC ACCESS RIGHT  06/20/2022   IR US GUIDE VASC ACCESS RIGHT  06/20/2022   IR VENO/EXT/BI  06/20/2022   IR VENOCAVAGRAM IVC  06/20/2022   JOINT REPLACEMENT     KNEE ARTHROSCOPY     ORIF ANKLE FRACTURE Right 08/27/2019   Procedure: OPEN REDUCTION INTERNAL FIXATION RIGHT ANKLE FRACTURE;  Surgeon: Renette Butters, MD;  Location: [facility];  Service: Orthopedics;  Laterality: Right;   perineorrophy     TONGUE BIOPSY      Social History:  Ambulatory   independently      reports that she has never smoked. She has never used smokeless tobacco. She reports that she does not drink alcohol and does not use drugs.     Family History:  Family History  Problem Relation Age of Onset   Depression Mother    Drug abuse Sister    Suicidality Sister    Alcohol abuse Brother    Drug abuse Brother    ______________________________________________________________________________________________ Allergies: Allergies  Allergen Reactions   Penicillins Shortness Of Breath    Other reaction(s): Irregular Heart Rate, Other (See Comments) Rapid heartrate    Alprazolam Hives and Other (See Comments)    Hard to arouse, unresponsiveness   Ativan [Lorazepam] Other (See Comments)    Note: tolerates midazolam fine Face & Throat Swelling.   Corticosteroids Other (See Comments)    Other reaction(s): Other (see comments) Psychotic behaviour     Erythromycin     Other  reaction(s): Other (See Comments) Severe stomach pain    Prednisone Other (See Comments)    Anxiety & Nervous Breakdown.   Savella  [Milnacipran]    Prednisolone Anxiety     Prior to Admission medications   Medication Sig Start Date End Date Taking? Authorizing Provider  Dabigatran Etexilate Mesylate (PRADAXA) 110 MG CAPS Take 110 mg by mouth 2 (two) times daily. 10/12/22  Yes Volanda Napoleon, MD  amLODipine (NORVASC) 5 MG tablet TAKE 1 TABLET(5 MG) BY MOUTH DAILY 11/09/22   [name], MD  buPROPion (WELLBUTRIN XL) 300 MG 24 hr tablet TAKE 1 TABLET(300 MG) BY MOUTH DAILY 11/28/22   Lesle Chris A, NP  busPIRone (BUSPAR) 15 MG tablet Take 1 tablet (15 mg total) by mouth 3 (three) times daily. 04/05/22   Elwanda Brooklyn, NP  citalopram (CELEXA) 40 MG tablet Take 1 tablet (40 mg total) by mouth daily. 09/21/22   Elwanda Brooklyn, NP  cyanocobalamin 1000 MCG tablet Take 1 tablet (1,000 mcg total) by mouth daily. 07/26/22   Cherene Altes, MD  dicyclomine (BENTYL) 20 MG tablet TAKE 1 TABLET(20 MG) BY MOUTH THREE TIMES DAILY BEFORE MEALS 10/25/22   Volanda Napoleon, MD  diphenoxylate-atropine (LOMOTIL) 2.5-0.025 MG tablet TAKE 2 TABLETS IN THE MORNING, AT NOON, IN THE EVENING AND AT BEDTIME 04/22/22   Ennever, Rudell Cobb, MD  eszopiclone (LUNESTA) 2 MG TABS tablet TAKE 1 TABLET(2 MG) BY MOUTH AT BEDTIME AS NEEDED FOR SLEEP 09/21/22   Elwanda Brooklyn, NP  famotidine (PEPCID) 20 MG tablet TAKE 2  TABLETS TWICE A DAY Patient taking differently: Take 40 mg by mouth 2 (two) times daily. 03/01/22   Volanda Napoleon, MD  ferrous sulfate 325 (65 FE) MG tablet Take 1 tablet (325 mg total) by mouth daily. 06/03/22 06/03/23  Manuella Ghazi, Pratik D, DO  hydrocortisone (ANUSOL-HC) 2.5 % rectal cream Place rectally 4 (four) times daily as needed for hemorrhoids or anal itching. Use 4 times daily x5 days, then 4 times daily as needed. 06/26/22   Eugenie Filler, MD  HYDROmorphone (DILAUDID) 4 MG tablet Take 1 tablet (4 mg  total) by mouth every 6 (six) hours as needed for severe pain. 10/12/22   Volanda Napoleon, MD  loperamide (IMODIUM) 2 MG capsule Take 2 capsules (4 mg total) by mouth 4 (four) times daily as needed for diarrhea or loose stools. Patient taking differently: Take 4 mg by mouth in the morning, at noon, in the evening, and at bedtime. 05/08/22   Aline August, MD  meclizine (ANTIVERT) 25 MG tablet TAKE 1 TABLET(25 MG) BY MOUTH THREE TIMES DAILY AS NEEDED FOR DIZZINESS 11/28/22   [name], MD  ondansetron (ZOFRAN) 8 MG tablet Take 8 mg by mouth every 8 (eight) hours as needed for nausea. 03/03/21   [provider]  orphenadrine (NORFLEX) 100 MG tablet TAKE 1 TABLET(100 MG) BY MOUTH AT BEDTIME AS NEEDED FOR MUSCLE SPASMS 10/10/22   Volanda Napoleon, MD  potassium chloride (KLOR-CON M) 10 MEQ tablet Take 2 tablets (20 mEq total) by mouth daily. 07/07/22   [name], MD  pramoxine-hydrocortisone (PROCTOCREAM-HC) 1-1 % rectal cream Place 1 Application rectally 2 (two) times daily. 07/04/22   Volanda Napoleon, MD  promethazine (PHENERGAN) 12.5 MG tablet TAKE 1 TABLET(12.5 MG) BY MOUTH TWICE DAILY AS NEEDED 10/26/22   [name], MD  propranolol (INDERAL) 20 MG tablet TAKE 1 TABLET(20 MG) BY MOUTH TWICE DAILY 10/25/22   [name], MD    ___________________________________________________________________________________________________ Physical Exam:    12/07/2022    4:30 PM 12/07/2022    4:00 PM 12/07/2022    3:56 PM  Vitals with BMI  Systolic [id number]  Diastolic 92 96 [id number]  Pulse 73 77 77     1. General:  in No  Acute distress   Chronically ill  -appearing 2. Psychological: Alert and  Oriented 3. Head/ENT:    Dry Mucous Membranes                          Head Non traumatic, neck supple                           Poor Dentition 4. SKIN:  decreased Skin turgor,  Skin clean Dry and intact no rash 5. Heart: Regular rate and rhythm no  Murmur, no Rub or gallop 6.  Lungs:  no wheezes or crackles   7. Abdomen: Soft,  non-tender, Non distended   obese   8. Lower extremities: no clubbing, cyanosis, no  edema 9. Neurologically Grossly intact, moving all 4 extremities equally   10. MSK: Normal range of motion    Chart has been reviewed  ______________________________________________________________________________________________  Assessment/Plan 62 y.o. female with medical history significant of appendiceal cancer with metastasis, SMV thrombus, IVC filter, essential tremors, history of bilateral DVTs, CAD, history of GI bleed, prior history of PE in July 2023, pernicious anemia, DM2, autoimmune hepatitis, hypertension, morbid obesity, GERD rheumatoid  arthritis  Admitted for   Single subsegmental pulmonary embolism without acute cor pulmonale      Present on Admission:  Pulmonary embolism (HCC)  DVT, bilateral lower limbs (HCC)  CAD (coronary artery disease)  Class 3 obesity ([place])  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)  Thyroid nodule     DVT, bilateral lower limbs (HCC) Would benefit from vascular consult continue heparin drip for right now  CAD (coronary artery disease) Chronic stable  DM2 (diabetes mellitus, type 2) ([place]) Order sliding scale  Class 3 obesity ([place]) Need to follow-up with nutrition as an outpatient  Pulmonary embolism ([place])  Admit to   telemetry   Initiate heparin drip in the setting of patient already being on Pradaxa appears to be failure.  Patient already status post IVC filter.  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  ER provided.Ennever is aware of admission  Thyroid nodule Obtain thyroid ultrasound to further evaluate   Other plan as per  orders.  DVT prophylaxis:  heparin   Code Status:    Code Status: Prior FULL CODE as per patient  I had personally discussed CODE STATUS with patient     Family Communication:   Family not at  Bedside    Disposition Plan:       To home once workup is complete and patient is stable   Following barriers for discharge:                                                    Will need consultants to evaluate patient prior to discharge                     Transition of care consulted                                        Consults called:    Consult to IR Dr. [name] is aware have seen in Er   Admission status:  ED Disposition     ED Disposition  [place]: [place] [100102]  Level of Care: Telemetry [5]  Admit to tele based on following criteria: Other see comments  Comments: pulmonary embolism  May place patient in observation at [facility] or [place] if equivalent level of care is available:: No  Covid Evaluation: Asymptomatic - no recent exposure (last 10 days) testing not required  Diagnosis: Pulmonary embolism [facility]) [id number]  Admitting Physician: Toy Baker [[id number]]  Attending Physician: Toy Baker [[id number]]          Obs      Level of care     tele  For  24H      Lenox Bink 12/07/2022, 8:34 PM    [facility]     after 2 AM please page floor coverage PA If 7AM-7PM, please contact the day team taking care of the patient using [email or web address]   Patient was evaluated in the context of the  global COVID-19 pandemic, which necessitated consideration that the patient might be at risk for infection with the SARS-CoV-2 virus that causes COVID-19. Institutional protocols and algorithms that pertain to the evaluation of patients at risk for COVID-19 are in a state of rapid change based on information released by regulatory bodies including the CDC and federal and state organizations. These  policies and algorithms were followed during the patient's care.

## 2022-12-07 NOTE — Subjective & Objective (Signed)
Patient has been having cough and sore throat for the past 5 days negative for COVID on 12 February.  But has been having significant shortness of breath and no fever.  Has been having more dizzy and generally weak. Patient known history of bilateral DVTs used to be on Lovenox now on Pradaxa reports good compliance she still have occasional left leg pain. She has history of appendiceal cancer fib SMV thrombus and known IVC filter She was seen by P CP yesterday had repeat COVID flu and RSV testing which was negative CT a was ordered and an ultrasound of the left leg. CTA showed active PE and patient was told to go to emergency department Dopplers of the left leg showed multiple DVTs

## 2022-12-07 NOTE — Telephone Encounter (Signed)
Received VM from pt from [facility] reporting that she had a CT chest this am revealing PE. Pt's PCP Dr Carlota Raspberry proceed to ED for possible admission. Pt questions if Dr [name] is in agreement with this. Per Dr [name], pt to continue to ED as recommended. This information left on pt's spouse's VM at pt's request to call him back because she is scheduled for doppler this am. dph

## 2022-12-08 ENCOUNTER — Encounter (HOSPITAL_COMMUNITY): Payer: Self-pay | Admitting: Internal Medicine

## 2022-12-08 ENCOUNTER — Observation Stay (HOSPITAL_COMMUNITY): Payer: Medicare Other

## 2022-12-08 DIAGNOSIS — K754 Autoimmune hepatitis: Secondary | ICD-10-CM | POA: Diagnosis present

## 2022-12-08 DIAGNOSIS — M797 Fibromyalgia: Secondary | ICD-10-CM | POA: Diagnosis present

## 2022-12-08 DIAGNOSIS — Z86718 Personal history of other venous thrombosis and embolism: Secondary | ICD-10-CM | POA: Diagnosis not present

## 2022-12-08 DIAGNOSIS — K55059 Acute (reversible) ischemia of intestine, part and extent unspecified: Secondary | ICD-10-CM | POA: Diagnosis present

## 2022-12-08 DIAGNOSIS — E669 Obesity, unspecified: Secondary | ICD-10-CM | POA: Diagnosis present

## 2022-12-08 DIAGNOSIS — Z818 Family history of other mental and behavioral disorders: Secondary | ICD-10-CM | POA: Diagnosis not present

## 2022-12-08 DIAGNOSIS — G250 Essential tremor: Secondary | ICD-10-CM | POA: Diagnosis present

## 2022-12-08 DIAGNOSIS — Z79899 Other long term (current) drug therapy: Secondary | ICD-10-CM | POA: Diagnosis not present

## 2022-12-08 DIAGNOSIS — Z23 Encounter for immunization: Secondary | ICD-10-CM | POA: Diagnosis present

## 2022-12-08 DIAGNOSIS — I2693 Single subsegmental pulmonary embolism without acute cor pulmonale: Secondary | ICD-10-CM | POA: Diagnosis not present

## 2022-12-08 DIAGNOSIS — E119 Type 2 diabetes mellitus without complications: Secondary | ICD-10-CM | POA: Diagnosis present

## 2022-12-08 DIAGNOSIS — E041 Nontoxic single thyroid nodule: Secondary | ICD-10-CM | POA: Diagnosis present

## 2022-12-08 DIAGNOSIS — Z86711 Personal history of pulmonary embolism: Secondary | ICD-10-CM | POA: Diagnosis not present

## 2022-12-08 DIAGNOSIS — I82411 Acute embolism and thrombosis of right femoral vein: Secondary | ICD-10-CM | POA: Diagnosis present

## 2022-12-08 DIAGNOSIS — M069 Rheumatoid arthritis, unspecified: Secondary | ICD-10-CM | POA: Diagnosis present

## 2022-12-08 DIAGNOSIS — D500 Iron deficiency anemia secondary to blood loss (chronic): Secondary | ICD-10-CM | POA: Diagnosis present

## 2022-12-08 DIAGNOSIS — I2602 Saddle embolus of pulmonary artery with acute cor pulmonale: Secondary | ICD-10-CM

## 2022-12-08 DIAGNOSIS — C181 Malignant neoplasm of appendix: Secondary | ICD-10-CM | POA: Diagnosis present

## 2022-12-08 DIAGNOSIS — C786 Secondary malignant neoplasm of retroperitoneum and peritoneum: Secondary | ICD-10-CM | POA: Diagnosis present

## 2022-12-08 DIAGNOSIS — I517 Cardiomegaly: Secondary | ICD-10-CM

## 2022-12-08 DIAGNOSIS — R0602 Shortness of breath: Secondary | ICD-10-CM | POA: Diagnosis present

## 2022-12-08 DIAGNOSIS — F32A Depression, unspecified: Secondary | ICD-10-CM | POA: Diagnosis present

## 2022-12-08 DIAGNOSIS — Z95828 Presence of other vascular implants and grafts: Secondary | ICD-10-CM | POA: Diagnosis not present

## 2022-12-08 DIAGNOSIS — I10 Essential (primary) hypertension: Secondary | ICD-10-CM | POA: Diagnosis present

## 2022-12-08 DIAGNOSIS — I2510 Atherosclerotic heart disease of native coronary artery without angina pectoris: Secondary | ICD-10-CM | POA: Diagnosis present

## 2022-12-08 DIAGNOSIS — F419 Anxiety disorder, unspecified: Secondary | ICD-10-CM | POA: Diagnosis present

## 2022-12-08 DIAGNOSIS — I2699 Other pulmonary embolism without acute cor pulmonale: Secondary | ICD-10-CM | POA: Diagnosis present

## 2022-12-08 DIAGNOSIS — C772 Secondary and unspecified malignant neoplasm of intra-abdominal lymph nodes: Secondary | ICD-10-CM | POA: Diagnosis present

## 2022-12-08 HISTORY — DX: Cardiomegaly: I51.7

## 2022-12-08 LAB — COMPREHENSIVE METABOLIC PANEL
ALT: 40 U/L (ref 0–44)
AST: 41 U/L (ref 15–41)
Albumin: 3.3 g/dL — ABNORMAL LOW (ref 3.5–5.0)
Alkaline Phosphatase: 194 U/L — ABNORMAL HIGH (ref 38–126)
Anion gap: 9 (ref 5–15)
BUN: 25 mg/dL — ABNORMAL HIGH (ref 6–20)
CO2: 24 mmol/L (ref 22–32)
Calcium: 8.5 mg/dL — ABNORMAL LOW (ref 8.9–10.3)
Chloride: 103 mmol/L (ref 98–111)
Creatinine, Ser: 1.05 mg/dL — ABNORMAL HIGH (ref 0.44–1.00)
GFR, Estimated: >60 mL/min (ref >60)
Glucose, Bld: 173 mg/dL — ABNORMAL HIGH (ref 70–99)
Potassium: 3.8 mmol/L (ref 3.5–5.1)
Sodium: 136 mmol/L (ref 135–145)
Total Bilirubin: 0.7 mg/dL (ref 0.3–1.2)
Total Protein: 6.9 g/dL (ref 6.5–8.1)

## 2022-12-08 LAB — CBC
HCT: 34 % — ABNORMAL LOW (ref 36.0–46.0)
Hemoglobin: 10.8 g/dL — ABNORMAL LOW (ref 12.0–15.0)
MCH: 30.7 pg (ref 26.0–34.0)
MCHC: 31.8 g/dL (ref 30.0–36.0)
MCV: 96.6 fL (ref 80.0–100.0)
Platelets: 240 10*3/uL (ref 150–400)
RBC: 3.52 MIL/uL — ABNORMAL LOW (ref 3.87–5.11)
RDW: 14.7 % (ref 11.5–15.5)
WBC: 10.5 10*3/uL (ref 4.0–10.5)
nRBC: 0 % (ref 0.0–0.2)

## 2022-12-08 LAB — ECHOCARDIOGRAM COMPLETE
Area-P 1/2: 2.36 cm2
Calc EF: 57.3 %
Height: 66 in
S' Lateral: 3.1 cm
Single Plane A2C EF: 57.5 %
Single Plane A4C EF: 56.8 %
Weight: 3856 oz

## 2022-12-08 LAB — HEMOGLOBIN A1C
Hgb A1c MFr Bld: 6.4 % — ABNORMAL HIGH (ref 4.8–5.6)
Mean Plasma Glucose: 136.98 mg/dL

## 2022-12-08 LAB — GLUCOSE, CAPILLARY
Glucose-Capillary: 105 mg/dL — ABNORMAL HIGH (ref 70–99)
Glucose-Capillary: 114 mg/dL — ABNORMAL HIGH (ref 70–99)
Glucose-Capillary: 165 mg/dL — ABNORMAL HIGH (ref 70–99)
Glucose-Capillary: 191 mg/dL — ABNORMAL HIGH (ref 70–99)
Glucose-Capillary: 81 mg/dL (ref 70–99)
Glucose-Capillary: 86 mg/dL (ref 70–99)

## 2022-12-08 LAB — HEPARIN LEVEL (UNFRACTIONATED)
Heparin Unfractionated: 0.74 IU/mL — ABNORMAL HIGH (ref 0.30–0.70)
Heparin Unfractionated: 0.6 IU/mL (ref 0.30–0.70)
Heparin Unfractionated: 0.62 IU/mL (ref 0.30–0.70)

## 2022-12-08 LAB — PHOSPHORUS: Phosphorus: 4.1 mg/dL (ref 2.5–4.6)

## 2022-12-08 LAB — BRAIN NATRIURETIC PEPTIDE: B Natriuretic Peptide: 40.6 pg/mL (ref 0.0–100.0)

## 2022-12-08 LAB — MAGNESIUM: Magnesium: 1.9 mg/dL (ref 1.7–2.4)

## 2022-12-08 LAB — TSH: TSH: 2.455 u[IU]/mL (ref 0.350–4.500)

## 2022-12-08 MED ORDER — ZOLPIDEM TARTRATE 5 MG PO TABS: 5.0000 mg | ORAL_TABLET | Freq: Every evening | ORAL | Status: DC | PRN

## 2022-12-08 MED ORDER — CHLORHEXIDINE GLUCONATE CLOTH 2 % EX PADS
6.0000 | MEDICATED_PAD | Freq: Every day | CUTANEOUS | Status: DC
  Administered 2022-12-08 – 2022-12-09 (×2): 6 via TOPICAL

## 2022-12-08 MED ORDER — PROMETHAZINE HCL 25 MG PO TABS
12.5000 mg | ORAL_TABLET | Freq: Every day | ORAL | Status: DC | PRN
  Administered 2022-12-08: 12.5 mg via ORAL
  Filled 2022-12-08: qty 1

## 2022-12-08 MED ORDER — ORPHENADRINE CITRATE ER 100 MG PO TB12
100.0000 mg | ORAL_TABLET | Freq: Every day | ORAL | Status: DC
  Administered 2022-12-08: 100 mg via ORAL
  Filled 2022-12-08: qty 1

## 2022-12-08 MED ORDER — PNEUMOCOCCAL VAC POLYVALENT 25 MCG/0.5ML IJ INJ
0.5000 mL | INJECTION | INTRAMUSCULAR | Status: AC
  Administered 2022-12-09: 0.5 mL via INTRAMUSCULAR
  Filled 2022-12-08: qty 0.5

## 2022-12-08 MED ORDER — AMLODIPINE BESYLATE 5 MG PO TABS
5.0000 mg | ORAL_TABLET | Freq: Every day | ORAL | Status: DC
  Administered 2022-12-08 – 2022-12-09 (×2): 5 mg via ORAL
  Filled 2022-12-08 (×2): qty 1

## 2022-12-08 MED ORDER — HYDROCORTISONE (PERIANAL) 2.5 % EX CREA
1.0000 | TOPICAL_CREAM | Freq: Four times a day (QID) | CUTANEOUS | Status: DC | PRN
  Administered 2022-12-08: 1 via RECTAL
  Filled 2022-12-08 (×2): qty 28.35

## 2022-12-08 MED ORDER — SODIUM CHLORIDE (PF) 0.9 % IJ SOLN
INTRAMUSCULAR | Status: AC
  Filled 2022-12-08: qty 50

## 2022-12-08 MED ORDER — FAMOTIDINE 20 MG PO TABS
40.0000 mg | ORAL_TABLET | Freq: Two times a day (BID) | ORAL | Status: DC
  Administered 2022-12-08 – 2022-12-09 (×3): 40 mg via ORAL
  Filled 2022-12-08 (×3): qty 2

## 2022-12-08 MED ORDER — PROMETHAZINE HCL 25 MG PO TABS: 12.5000 mg | ORAL_TABLET | ORAL | Status: DC

## 2022-12-08 MED ORDER — IOHEXOL 350 MG/ML SOLN
100.0000 mL | Freq: Once | INTRAVENOUS | Status: AC | PRN
  Administered 2022-12-08: 100 mL via INTRAVENOUS

## 2022-12-08 MED ORDER — DICYCLOMINE HCL 20 MG PO TABS
20.0000 mg | ORAL_TABLET | Freq: Three times a day (TID) | ORAL | Status: DC
  Administered 2022-12-08 – 2022-12-09 (×3): 20 mg via ORAL
  Filled 2022-12-08 (×4): qty 1

## 2022-12-08 MED ORDER — LOPERAMIDE HCL 2 MG PO CAPS
4.0000 mg | ORAL_CAPSULE | Freq: Once | ORAL | Status: AC
  Administered 2022-12-08: 4 mg via ORAL
  Filled 2022-12-08: qty 2

## 2022-12-08 MED ORDER — INFLUENZA VAC SPLIT QUAD 0.5 ML IM SUSY
0.5000 mL | PREFILLED_SYRINGE | INTRAMUSCULAR | Status: AC
  Administered 2022-12-09: 0.5 mL via INTRAMUSCULAR

## 2022-12-08 MED ORDER — DIPHENOXYLATE-ATROPINE 2.5-0.025 MG PO TABS
2.0000 | ORAL_TABLET | Freq: Four times a day (QID) | ORAL | Status: DC
  Administered 2022-12-08 – 2022-12-09 (×5): 2 via ORAL
  Filled 2022-12-08 (×5): qty 2

## 2022-12-08 MED ORDER — PROMETHAZINE HCL 25 MG PO TABS
12.5000 mg | ORAL_TABLET | Freq: Every day | ORAL | Status: DC
  Administered 2022-12-08: 12.5 mg via ORAL
  Filled 2022-12-08: qty 1

## 2022-12-08 MED ORDER — LOPERAMIDE HCL 2 MG PO CAPS
4.0000 mg | ORAL_CAPSULE | Freq: Four times a day (QID) | ORAL | Status: DC
  Administered 2022-12-08 – 2022-12-09 (×4): 4 mg via ORAL
  Filled 2022-12-08 (×4): qty 2

## 2022-12-08 NOTE — Progress Notes (Signed)
Echocardiogram 2D Echocardiogram has been performed.  Wendy Barber 12/08/2022, 3:10 PM

## 2022-12-08 NOTE — Progress Notes (Signed)
[place] for IV heparin Indication: pulmonary embolus  Allergies  Allergen Reactions   Penicillins Shortness Of Breath and Other (See Comments)    Irregular and rapid Heart Rate, too    Alprazolam Hives and Other (See Comments)    Hard to arouse, unresponsiveness also   Ativan [Lorazepam] Swelling and Other (See Comments)    Face & Throat Swelling  Note: tolerates midazolam fine   Corticosteroids Other (See Comments)    "Psychotic behavior"    Erythromycin Other (See Comments)    Severe stomach pain    Gabapentin Other (See Comments)    Made the patient feel depressed   Savella  [Milnacipran] Other (See Comments)    Reaction not noted   Prednisolone Anxiety   Prednisone Anxiety and Other (See Comments)    "Anxiety & Nervous Breakdown"    Patient Measurements: Height: 5' 6"$  (167.6 cm) Weight: 109.3 kg (241 lb) IBW/kg (Calculated) : 59.3 Heparin Dosing Weight: 85 kg (used [place] nomogram for initial dosing)  Vital Signs: Temp: 97.9 F (36.6 C) (02/15 0130) Temp Source: Oral (02/15 0130) BP: 140/87 (02/15 0130) Pulse Rate: 70 (02/15 0130)  Labs: Recent Labs    12/06/22 1108 12/07/22 1610 12/07/22 1957 12/07/22 2316 12/08/22 0332  HGB 12.7 12.3  --   --  10.8*  HCT 38.9 38.0  --   --  34.0*  PLT 274.0 258  --   --  240  LABPROT  --  13.5  --   --   --   INR  --  1.0  --   --   --   HEPARINUNFRC  --   --   --   --  0.74*  CREATININE 1.07 1.32*  --   --   --   CKTOTAL  --   --   --  88  --   TROPONINIHS  --   --  4 3  --      Estimated Creatinine Clearance: 56.7 mL/min (A) (by C-G formula based on SCr of 1.32 mg/dL (H)).   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   Pernicious anemia 05/14/2021   Presence of IVC filter 03/19/2020   Pulmonary embolism, bilateral ([place]) 03/19/2020    Medications:  Medications Prior to Admission  Medication Sig Dispense Refill Last Dose   amLODipine (NORVASC) 5 MG tablet TAKE 1 TABLET(5 MG) BY MOUTH DAILY (Patient taking differently: Take 5 mg by mouth daily.) 90 tablet 1 12/07/2022 at am   buPROPion (WELLBUTRIN XL) 300 MG 24 hr tablet TAKE 1 TABLET(300 MG) BY MOUTH DAILY (Patient taking differently: Take 300 mg by mouth in the morning.) 90 tablet 0 12/07/2022 at am   busPIRone (BUSPAR) 15 MG tablet Take 1 tablet (15 mg total) by mouth 3 (three) times daily. 270 tablet 3 12/07/2022 at am   citalopram (CELEXA) 40 MG tablet Take 1 tablet (40 mg total) by mouth daily. 90 tablet 1 12/07/2022 at am  cyanocobalamin 1000 MCG tablet Take 1 tablet (1,000 mcg total) by mouth daily.   12/07/2022 at am   Dabigatran Etexilate Mesylate (PRADAXA) 110 MG CAPS Take 110 mg by mouth 2 (two) times daily. 60 capsule 12 12/07/2022 at 0600   dicyclomine (BENTYL) 20 MG tablet TAKE 1 TABLET(20 MG) BY MOUTH THREE TIMES DAILY BEFORE MEALS (Patient taking differently: Take 20 mg by mouth 3 (three) times daily before meals.) 90 tablet 4 12/07/2022 at am   diphenoxylate-atropine (LOMOTIL) 2.5-0.025 MG tablet TAKE 2 TABLETS IN THE MORNING, AT NOON, IN THE EVENING AND AT BEDTIME (Patient taking differently: Take 2 tablets by mouth in the morning, at noon, in the evening, and at bedtime.) 240 tablet 3 12/07/2022   eszopiclone (LUNESTA) 2 MG TABS tablet TAKE 1 TABLET(2 MG) BY MOUTH AT BEDTIME AS NEEDED FOR SLEEP (Patient taking differently: Take 2 mg by mouth at bedtime.) 30 tablet 3 12/06/2022 at pm   ferrous sulfate 325 (65 FE) MG  tablet Take 1 tablet (325 mg total) by mouth daily. 30 tablet 3 12/07/2022 at am   hydrocortisone (ANUSOL-HC) 2.5 % rectal cream Place rectally 4 (four) times daily as needed for hemorrhoids or anal itching. Use 4 times daily x5 days, then 4 times daily as needed. (Patient taking differently: Place 1 Application rectally 4 (four) times daily as needed for hemorrhoids or anal itching (may use 4 times a day for five days, then 4 times a day as needed).) 30 g 1 unk   HYDROmorphone (DILAUDID) 4 MG tablet Take 1 tablet (4 mg total) by mouth every 6 (six) hours as needed for severe pain. 30 tablet 0 Past Week   loperamide (IMODIUM) 2 MG capsule Take 2 capsules (4 mg total) by mouth 4 (four) times daily as needed for diarrhea or loose stools. (Patient taking differently: Take 4 mg by mouth in the morning, at noon, in the evening, and at bedtime.)   12/07/2022 at am   meclizine (ANTIVERT) 25 MG tablet TAKE 1 TABLET(25 MG) BY MOUTH THREE TIMES DAILY AS NEEDED FOR DIZZINESS (Patient taking differently: Take 25 mg by mouth in the morning and at bedtime.) 30 tablet 0 12/07/2022 at am   ondansetron (ZOFRAN) 8 MG tablet Take 8 mg by mouth See admin instructions. Am yes and 2 day if neneced   12/07/2022   orphenadrine (NORFLEX) 100 MG tablet TAKE 1 TABLET(100 MG) BY MOUTH AT BEDTIME AS NEEDED FOR MUSCLE SPASMS (Patient taking differently: Take 100 mg by mouth at bedtime.) 30 tablet 2 12/06/2022 at pm   pantoprazole (PROTONIX) 40 MG tablet Take 40 mg by mouth daily before breakfast.   12/07/2022 at am   pramoxine-hydrocortisone (PROCTOCREAM-HC) 1-1 % rectal cream Place 1 Application rectally 2 (two) times daily. (Patient taking differently: Place 1 Application rectally 2 (two) times daily as needed for hemorrhoids or anal itching (if hydrocortisone 2.5% cream is not available).) 30 g 3 unk   promethazine (PHENERGAN) 12.5 MG tablet TAKE 1 TABLET(12.5 MG) BY MOUTH TWICE DAILY AS NEEDED (Patient taking differently: Take 12.5 mg by  mouth See admin instructions. Take 12.5 mg by mouth at bedtime and an additional 12.5 mg once a day as needed for nausea or vomiting) 30 tablet 3 12/06/2022   propranolol (INDERAL) 20 MG tablet TAKE 1 TABLET(20 MG) BY MOUTH TWICE DAILY (Patient taking differently: Take 20 mg by mouth in the morning and at bedtime.) 180 tablet 1 12/07/2022 at am   famotidine (PEPCID) 20 MG tablet TAKE 2 TABLETS  TWICE A DAY (Patient not taking: Reported on 12/07/2022) 360 tablet 3 Not Taking   potassium chloride (KLOR-CON M) 10 MEQ tablet Take 2 tablets (20 mEq total) by mouth daily. (Patient not taking: Reported on 12/07/2022) 60 tablet 1 Not Taking   Scheduled:   busPIRone  15 mg Oral TID   citalopram  40 mg Oral Daily   influenza vac split quadrivalent PF  0.5 mL Intramuscular Tomorrow-1000   insulin aspart  0-9 Units Subcutaneous Q4H   pantoprazole  40 mg Oral QAC breakfast   propranolol  20 mg Oral BID   PRN:   Assessment: 61 yoF with PMH appendiceal cancer followed by Dr. [name]; Hx DVT/PE with prior IVC filter placement, on Pradaxa PTA and. Presented to PCP with cough x 7d and CT revealed new segmental RLL PE without RH strain noted. Pharmacy consulted to transition patient to IV heparin pending new chronic anticoag plans. Patient initially prescribed Lovenox for VTE treatment Transitioned to Xarelto in 2023 but had to transition back to Lovenox after LGIB on Xarelto Since Dec 2023, patient has been on Pradaxa with (reportedly) good compliance Baseline INR not elevated, aPTT not done Prior anticoagulation: dabigatran 110 mg PO bid, last dose 2/14 @ 06:00  Significant events:  Today, 12/08/2022: Initial heparin level 0.74 on IV heparin 1450 units/hr Confirmed heparin infusing thru PIV and lab drawn by IVRN thru central line CBC: Hg low/decreased from baseline at 10.8; pltc WNL SCr mildly elevated from yesterday; baseline ~1.0 No bleeding or infusion issues per nursing  Goal of Therapy: Heparin level  0.3-0.7 units/ml Monitor platelets by anticoagulation protocol: Yes  Plan: Decrease IV heparin infusion to 1300 units/hr  Check heparin level 8 hrs after rate decrease Daily CBC, daily heparin level once stable Monitor for signs of bleeding or thrombosis F/u plans for chronic anticoagulation  Netta Cedars, PharmD, BCPS 12/08/2022, 4:18 AM

## 2022-12-08 NOTE — Progress Notes (Signed)
Patient with a history of appendiceal cancer, bilateral lower extremity DVT with iliac and IVC thrombosis. Patient has an IVC filter placed in 2005. IR asked to evaluate this patient for possible procedure to address current PE/DVT. Patient is familiar to IR from prior venograms/thrombectomies and port placement. IR requested CT venogram abdomen/pelvis for further evaluation.   CT venogram reviewed by Dr. Serafina Royals - "Similar findings on duplex that clot is limited to right common femoral vein and central aspects of the profunda and femoral veins.  No evidence of iliocaval extension or caval thrombus".   No current indication for intervention and IR agrees with continued anticoagulation. The patient's primary team has been made aware and the order will be deleted.   Soyla Dryer, [place] [phone] 12/08/2022, 2:34 PM

## 2022-12-08 NOTE — Progress Notes (Signed)
ANTICOAGULATION CONSULT NOTE - Follow Up Consult  Pharmacy Consult for heparin  Indication: acute PE; hx SMV thrombus, PE/bilateral DVT (PTA pradaxa on hold)  Allergies  Allergen Reactions   Penicillins Shortness Of Breath and Other (See Comments)    Irregular and rapid Heart Rate, too    Alprazolam Hives and Other (See Comments)    Hard to arouse, unresponsiveness also   Ativan [Lorazepam] Swelling and Other (See Comments)    Face & Throat Swelling  Note: tolerates midazolam fine   Corticosteroids Other (See Comments)    "Psychotic behavior"    Erythromycin Other (See Comments)    Severe stomach pain    Gabapentin Other (See Comments)    Made the patient feel depressed   Savella  [Milnacipran] Other (See Comments)    Reaction not noted   Prednisolone Anxiety   Prednisone Anxiety and Other (See Comments)    "Anxiety & Nervous Breakdown"    Patient Measurements: Height: 5' 6"$  (167.6 cm) Weight: 109.3 kg (241 lb) IBW/kg (Calculated) : 59.3 Heparin Dosing Weight: 85 kg  Vital Signs: Temp: 97.9 F (36.6 C) (02/15 0540) Temp Source: Oral (02/15 0540) BP: 138/98 (02/15 0540) Pulse Rate: 65 (02/15 0540)  Labs: Recent Labs    12/06/22 1108 12/07/22 1610 12/07/22 1957 12/07/22 2316 12/08/22 0332  HGB 12.7 12.3  --   --  10.8*  HCT 38.9 38.0  --   --  34.0*  PLT 274.0 258  --   --  240  LABPROT  --  13.5  --   --   --   INR  --  1.0  --   --   --   HEPARINUNFRC  --   --   --   --  0.74*  CREATININE 1.07 1.32*  --   --  1.05*  CKTOTAL  --   --   --  88  --   TROPONINIHS  --   --  4 3  --     Estimated Creatinine Clearance: 71.3 mL/min (A) (by C-G formula based on SCr of 1.05 mg/dL (H)).   Medications:  - on Pradaxa 110 mg bid PTA (last dose taken on 12/07/22 at 0600)  Assessment: Patient is a 62 y.o F with hx recurrent appendiceal cancer, GIB, SMV thrombus, PE/bilateral DVT (s/p IVC filter)  on Pradaxa PTA who presented to the ED on 12/07/22 after an outpatient  chest CT showed acute PE with no evidence of RHS and LE doppler showed extensive age indeterminate RLE DVT and chronic BL DVTs. Of note she was on full dose LMWH and recently changed to Pradaxa in Dec 2023. She's currently on heparin drip for VTE treatment.   Today, 12/08/2022: - heparin level is therapeutic at 0.60 - hgb 10.8, plts ok - no bleeding documented    Goal of Therapy:  Heparin level 0.3-0.7 units/ml Monitor platelets by anticoagulation protocol: Yes   Plan:  - continue heparin drip at 1300 units/hr - check heparin level at 8p to ensure level remains therapeutic before changing to daily monitoring - monitor for s/sx bleeding   Zyaire Dumas P 12/08/2022,11:40 AM

## 2022-12-08 NOTE — Progress Notes (Signed)
ANTICOAGULATION CONSULT NOTE - Follow Up Consult  Pharmacy Consult for heparin  Indication: acute PE; hx SMV thrombus, PE/bilateral DVT (PTA pradaxa on hold)  Allergies  Allergen Reactions   Penicillins Shortness Of Breath and Other (See Comments)    Irregular and rapid Heart Rate, too    Alprazolam Hives and Other (See Comments)    Hard to arouse, unresponsiveness also   Ativan [Lorazepam] Swelling and Other (See Comments)    Face & Throat Swelling  Note: tolerates midazolam fine   Corticosteroids Other (See Comments)    "Psychotic behavior"    Erythromycin Other (See Comments)    Severe stomach pain    Gabapentin Other (See Comments)    Made the patient feel depressed   Savella  [Milnacipran] Other (See Comments)    Reaction not noted   Prednisolone Anxiety   Prednisone Anxiety and Other (See Comments)    "Anxiety & Nervous Breakdown"    Patient Measurements: Height: 5' 6"$  (167.6 cm) Weight: 109.3 kg (241 lb) IBW/kg (Calculated) : 59.3 Heparin Dosing Weight: 85 kg  Vital Signs: Temp: 98.4 F (36.9 C) (02/15 2006) Temp Source: Oral (02/15 2006) BP: 157/92 (02/15 2006) Pulse Rate: 69 (02/15 2006)  Labs: Recent Labs    12/06/22 1108 12/07/22 1610 12/07/22 1957 12/07/22 2316 12/08/22 0332 12/08/22 1352 12/08/22 2010  HGB 12.7 12.3  --   --  10.8*  --   --   HCT 38.9 38.0  --   --  34.0*  --   --   PLT 274.0 258  --   --  240  --   --   LABPROT  --  13.5  --   --   --   --   --   INR  --  1.0  --   --   --   --   --   HEPARINUNFRC  --   --   --   --  0.74* 0.60 0.62  CREATININE 1.07 1.32*  --   --  1.05*  --   --   CKTOTAL  --   --   --  88  --   --   --   TROPONINIHS  --   --  4 3  --   --   --      Estimated Creatinine Clearance: 71.3 mL/min (A) (by C-G formula based on SCr of 1.05 mg/dL (H)).   Medications:  - on Pradaxa 110 mg bid PTA (last dose taken on 12/07/22 at 0600)  Assessment: Patient is a 62 y.o F with hx recurrent appendiceal cancer,  GIB, SMV thrombus, PE/bilateral DVT (s/p IVC filter)  on Pradaxa PTA who presented to the ED on 12/07/22 after an outpatient chest CT showed acute PE with no evidence of RHS and LE doppler showed extensive age indeterminate RLE DVT and chronic BL DVTs. Of note she was on full dose LMWH and recently changed to Pradaxa in Dec 2023. She's currently on heparin drip for VTE treatment.   Today, 12/08/2022: - heparin level is therapeutic at 0.62 - hgb 10.8, plts ok - no bleeding documented    Goal of Therapy:  Heparin level 0.3-0.7 units/ml Monitor platelets by anticoagulation protocol: Yes   Plan:  - continue heparin drip at 1300 units/hr - Daily HL and CBC while on heparin  - monitor for s/sx bleeding    Royetta Asal, PharmD, BCPS 12/08/2022 8:54 PM

## 2022-12-08 NOTE — Progress Notes (Signed)
PROGRESS NOTE   Wendy Barber  [id number] DOB: Nov 13, 1960 DOA: 12/07/2022 PCP: [name], MD  Brief Narrative:  62 year old white female appendiceal CA recurrent care doctor under care of Dr. 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 and had an IVC filter placed at some point --Was supposed to go to [facility] 12/24 elective colostomy (has recurrent chronic diarrhea)-turns out she is not really a good candidate for any colostomy Visited her PCP office 12/06/2022-because of worsening cough concern for PE:-Has been more dizzy than usual cough and sore throat negative COVID test on 12/05/2022 Found to have acute PE and directed to admit to the hospital  Hospital-Problem based course  Acute pulmonary embolism in the setting of known SMV thrombosis and prior DVT with IVC filter - Continuing heparin at this time as gtt. - Continue LR 125 cc/H -Appreciate in advance IR seen the patient for chemical thrombolysis, per Dr. Unk Lightning of vascular surgery --ot a candidate for mechanical thrombectomy, per Dr. Serafina Royals IR, not a candidate for chemical thrombolysis  -Debatable candidate for filter retrieval-will need to follow-up with IR in the outpatient - Defer to Dr. [name] expertise whether transition to Lovenox again versus other DOAC vs Coumadin or (Arixtra)   Recurrent appendiceal cancer metastatic - Defer to Dr. [name] - Continuing hydromorphone 4 every 6 as needed for pain - Given diarrhea issues will resume normal to 2 tabs 4 times daily, Imodium to 4 as well as dicyclomine 20 and famotidine 40 twice daily  Thyroid nodule - Characterizing with ultrasound of neck- 2.3 cm ill-defined TI-RADS category 3 nodule versus pseudo nodule in the right lower gland. - rpt scan in 1 year or as per Dr. [name]  DM TY 2 -Appears to be diet  controlled  HTN - Continue amlodipine 5 mg daily  Anxiety/depression - Continue BuSpar 15 3 times daily Celexa 40 daily  DVT prophylaxis: Heparin Code Status: Full Family Communication: Husband Disposition:  Status is: Observation The patient remains OBS appropriate and will d/c before 2 midnights.    Subjective: Seems uncomfortable having a lot of diarrhea we replaced her usual medications No chest pain no fever no nausea no vomiting ROM intact no focal deficit   Objective: Vitals:   12/07/22 1950 12/07/22 2133 12/08/22 0130 12/08/22 0540  BP:  (!) 152/89 (!) 140/87 (!) 138/98  Pulse:  74 70 65  Resp:  20  20  Temp: 98.2 F (36.8 C) 98.2 F (36.8 C) 97.9 F (36.6 C) 97.9 F (36.6 C)  TempSrc: Oral Oral Oral Oral  SpO2:  96% 95% 96%  Weight:      Height:        Intake/Output Summary (Last 24 hours) at 12/08/2022 1245 Last data filed at 12/08/2022 0200 Gross per 24 hour  Intake 239.78 ml  Output --  Net 239.78 ml   Filed Weights   12/07/22 1532  Weight: 109.3 kg    Examination:  Awake coherent white female no distress Mallampati 4 cannot appreciate thyromegaly ROM intact moving 4 limbs equally S1-S2 no murmur Abdomen slightly tender distended slightly Lower extremity slightly swollen right side greater than sign left Power 5/5 Psych euthymic although looks to be a little uncomfortable Rectal exam deferred  Data Reviewed: personally reviewed   CBC    Component Value Date/Time   WBC 10.5 12/08/2022 0332   RBC 3.52 (L) 12/08/2022 0332   HGB 10.8 (  L) 12/08/2022 0332   HGB 14.2 10/12/2022 0945   HCT 34.0 (L) 12/08/2022 0332   PLT 240 12/08/2022 0332   PLT 295 10/12/2022 0945   MCV 96.6 12/08/2022 0332   MCH 30.7 12/08/2022 0332   MCHC 31.8 12/08/2022 0332   RDW 14.7 12/08/2022 0332   LYMPHSABS 3.0 10/12/2022 0945   MONOABS 0.9 10/12/2022 0945   EOSABS 0.2 10/12/2022 0945   BASOSABS 0.1 10/12/2022 0945      Latest Ref Rng & Units 12/08/2022     3:32 AM 12/07/2022    4:10 PM 12/06/2022   11:08 AM  CMP  Glucose 70 - 99 mg/dL 173  190  180   BUN 6 - 20 mg/dL 25  25  21   $ Creatinine 0.44 - 1.00 mg/dL 1.05  1.32  1.07   Sodium 135 - 145 mmol/L 136  139  140   Potassium 3.5 - 5.1 mmol/L 3.8  3.9  3.9   Chloride 98 - 111 mmol/L 103  105  104   CO2 22 - 32 mmol/L 24  24  30   $ Calcium 8.9 - 10.3 mg/dL 8.5  8.9  9.6   Total Protein 6.5 - 8.1 g/dL 6.9     Total Bilirubin 0.3 - 1.2 mg/dL 0.7     Alkaline Phos 38 - 126 U/L 194     AST 15 - 41 U/L 41     ALT 0 - 44 U/L 40        Radiology Studies: CT VENOGRAM ABD/PEL  Result Date: 12/08/2022 CLINICAL DATA:  62 year old with history of appendiceal cancer. History of bilateral lower extremity DVT with iliac and IVC thrombosis. Patient has an IVC filter. EXAM: CT VENOGRAM ABDOMEN AND PELVIS TECHNIQUE: Multiplanar images were obtained of the abdomen and pelvis following administration of intravenous contrast. Images were obtained during the venous phase of contrast. RADIATION DOSE REDUCTION: This exam was performed according to the departmental dose-optimization program which includes automated exposure control, adjustment of the mA and/or kV according to patient size and/or use of iterative reconstruction technique. CONTRAST:  168m OMNIPAQUE IOHEXOL 350 MG/ML SOLN COMPARISON:  CT venogram 06/15/2022. CT abdomen and pelvis 12/08/2021 FINDINGS: Lower chest: Small dependent densities at the lung bases are suggestive for atelectasis or scarring. No large pleural effusions. Central line tip terminates in the right atrium. Hepatobiliary: Cholecystectomy. Minimal intrahepatic biliary dilatation. Extrahepatic bile duct measures roughly 1.0 cm and similar to the previous examination. Liver has a slightly nodular contour with prominent left hepatic lobe. Findings are similar to the previous examination. No discrete liver lesion. Pancreas: Unremarkable. No pancreatic ductal dilatation or surrounding  inflammatory changes. Spleen: Splenectomy Adrenals/Urinary Tract: Normal adrenal glands. Stable dilatation of the left renal pelvis without hydronephrosis. Normal appearance of the urinary bladder. Mild dilatation of the right renal pelvis is stable. Negative for right hydronephrosis. No suspicious renal lesions. Stomach/Bowel: Normal appearance of the stomach. Again noted are extensive postoperative changes involving bowel loops throughout the abdomen and pelvis. Multiple adjacent bowel loops in the anterior abdomen are again noted and there are few dilated loops of bowel in this area that similar to prior examinations. Surgical bowel clips in the right lower quadrant and rectal region. No evidence to suggest acute bowel obstruction or acute bowel inflammation. Arterial/lymphatic: Mild atherosclerotic disease in the abdominal aorta without aneurysm. Main visceral arteries are patent. Common, internal and external iliac arteries are patent bilaterally. No significant lymph node enlargement in the abdomen or pelvis. Reproductive: Status post  hysterectomy. No adnexal masses. Other: Negative for free fluid.  Negative for free air. Musculoskeletal: Foci of low density in the anterior subcutaneous tissues likely related to injection sites. Postoperative changes along the anterior abdominal wall at the midline. Posterior interbody fusion at L4-L5 with bilateral pedicle screws and interbody device. Mild scoliosis in the thoracolumbar spine. Disc space narrowing lower thoracic spine. IVC: Stable appearance of an IVC filter with the top of the filter at the level of the renal veins. Extensive penetration of the filter legs through the IVC is similar to the prior examination. IVC is patent and there is no definite thrombus within the IVC or within the filter. Bilateral renal veins are patent. Portal and mesenteric veins: No evidence for thrombus or stenosis. Bilateral iliac veins: Mild compression on the left common iliac  vein from the left common iliac artery is similar to the prior examination. Thrombus at the junction of the right common femoral vein and right external iliac vein at the level of the inguinal ligament. No other iliac vein thrombus. Right upper thigh: Thrombus in the right common femoral vein that extends to the junction of the right external iliac vein. Evidence of thrombus in the right profunda femoral vein. Left upper thigh: No significant thrombus in the proximal left femoral veins. IMPRESSION: 1. Thrombus in the proximal right femoral veins. The thrombus extends proximally to the junction of the right common femoral vein and right external iliac vein. No other significant iliac vein thrombus. 2. IVC is patent without thrombus. 3. Stable appearance of the IVC filter with extensive filter leg penetration outside of the IVC. IVC filter findings are similar to the prior examination. 4. No acute abnormality in the abdomen or pelvis. Extensive postoperative bowel changes without evidence for acute bowel obstruction or inflammation. 5. Stable nodularity of the liver which could be associated with cirrhotic changes or post treatment changes in liver. Electronically Signed   By: Markus Daft M.D.   On: 12/08/2022 11:36   US THYROID  Result Date: 12/08/2022 CLINICAL DATA:  Incidental on CT. EXAM: THYROID ULTRASOUND TECHNIQUE: Ultrasound examination of the thyroid gland and adjacent soft tissues was performed. COMPARISON:  None Available. FINDINGS: Parenchymal Echotexture: Mildly heterogenous Isthmus: 0.6 cm Right lobe: 4.4 x 1.8 x 1.9 cm Left lobe: 3.8 x 1.5 x 1.1 cm _________________________________________________________ Estimated total number of nodules >/= 1 cm: 1 Number of spongiform nodules >/=  2 cm not described below (TR1): 0 Number of mixed cystic and solid nodules >/= 1.5 cm not described below ([place]): 0 _________________________________________________________ Nodule # 1: Location: Right; Inferior Maximum  size: 2.3 cm; Other 2 dimensions: 1.5 x 2.3 cm Composition: solid/almost completely solid (2) Echogenicity: isoechoic (1) Shape: not taller-than-wide (0) Margins: ill-defined (0) Echogenic foci: none (0) ACR TI-RADS total points: 3. ACR TI-RADS risk category: TR3 (3 points). ACR TI-RADS recommendations: *Given size (>/= 1.5 - 2.4 cm) and appearance, a follow-up ultrasound in 1 year should be considered based on TI-RADS criteria. _________________________________________________________ IMPRESSION: The nodule seen on CT imaging corresponds with a 2.3 cm ill-defined TI-RADS category 3 nodule versus pseudo nodule in the right lower gland. This lesion meets criteria for imaging surveillance. Recommend follow-up ultrasound in 1 years. The above is in keeping with the ACR TI-RADS recommendations - J Am Coll Radiol 2017;14:587-595. Electronically Signed   By: Jacqulynn Cadet M.D.   On: 12/08/2022 06:00   VAS [place] LOWER EXTREMITY VENOUS (DVT)  Result Date: 12/07/2022  Lower Venous DVT Study Patient Name:  Delton See  Date of Exam:   12/07/2022 Medical Rec #: [id number]       Accession #:    [id number] Date of Birth: [birth year]       Patient Gender: F Patient Age:   89 years Exam Location:  [facility] Procedure:      VAS [place] LOWER EXTREMITY VENOUS (DVT) Referring Phys: JEFFREY GREENE --------------------------------------------------------------------------------  Indications: Pain.  Risk Factors: Hx of DVT & PE (IVC filter in place) Recurrent appendiceal cancer. Anticoagulation: Pradaxa. Limitations: Poor ultrasound/tissue interface. Comparison Study: Previous exam on 07/28/22 was positive for chronic DVT in BLE. Performing Technologist: Rogelia Rohrer RVT, RDMS  Examination Guidelines: A complete evaluation includes B-mode imaging, spectral Doppler, color Doppler, and power Doppler as needed of all accessible portions of each vessel. Bilateral testing is considered an integral part of a complete examination.  Limited examinations for reoccurring indications may be performed as noted. The reflux portion of the exam is performed with the patient in reverse Trendelenburg.  +---------+---------------+---------+-----------+----------+-----------------+ RIGHT    CompressibilityPhasicitySpontaneityPropertiesThrombus Aging    +---------+---------------+---------+-----------+----------+-----------------+ CFV      None           No       No                   Age Indeterminate +---------+---------------+---------+-----------+----------+-----------------+ SFJ      Partial                                      Age Indeterminate +---------+---------------+---------+-----------+----------+-----------------+ FV Prox  Full           No       Yes                                    +---------+---------------+---------+-----------+----------+-----------------+ FV Mid   Full           Yes      Yes                                    +---------+---------------+---------+-----------+----------+-----------------+ FV DistalFull           Yes      Yes                                    +---------+---------------+---------+-----------+----------+-----------------+ PFV      None           No       No                   Age Indeterminate +---------+---------------+---------+-----------+----------+-----------------+ POP      Partial        No       Yes                  Chronic           +---------+---------------+---------+-----------+----------+-----------------+ PTV      Full                                                           +---------+---------------+---------+-----------+----------+-----------------+  PERO     Full                                                           +---------+---------------+---------+-----------+----------+-----------------+ EIV      Partial        No       No                   Age Indeterminate  +---------+---------------+---------+-----------+----------+-----------------+ Proximal portion of EIV appears patent by color and doppler  +---------+---------------+---------+-----------+----------+--------------+ LEFT     CompressibilityPhasicitySpontaneityPropertiesThrombus Aging +---------+---------------+---------+-----------+----------+--------------+ CFV      Full           Yes      Yes                                 +---------+---------------+---------+-----------+----------+--------------+ SFJ      Partial                                      Chronic        +---------+---------------+---------+-----------+----------+--------------+ FV Prox  Full           Yes      Yes                                 +---------+---------------+---------+-----------+----------+--------------+ FV Mid   Full           Yes      Yes                                 +---------+---------------+---------+-----------+----------+--------------+ FV DistalFull           Yes      Yes                                 +---------+---------------+---------+-----------+----------+--------------+ PFV      Full                                                        +---------+---------------+---------+-----------+----------+--------------+ POP      Full           Yes      Yes                                 +---------+---------------+---------+-----------+----------+--------------+ PTV      Full                                                        +---------+---------------+---------+-----------+----------+--------------+ PERO     Full                                                        +---------+---------------+---------+-----------+----------+--------------+  Summary: BILATERAL: -No evidence of popliteal cyst, bilaterally. RIGHT: - Findings consistent with age indeterminate deep vein thrombosis involving the EIV, right common femoral vein, SF junction, and right  proximal profunda vein. - Findings consistent with chronic deep vein thrombosis involving the right popliteal vein.  LEFT: - Findings consistent with chronic deep vein thrombosis involving the SF junction.  *See table(s) above for measurements and observations. Electronically signed by Monica Martinez MD on 12/07/2022 at 11:14:20 AM.    Final    CT Angio Chest W/Cm &/Or Wo Cm  Result Date: 12/07/2022 CLINICAL DATA:  Rule out pulmonary embolism. Dyspnea. Weakness. History of DVT. EXAM: CT ANGIOGRAPHY CHEST WITH CONTRAST TECHNIQUE: Multidetector CT imaging of the chest was performed using the standard protocol during bolus administration of intravenous contrast. Multiplanar CT image reconstructions and MIPs were obtained to evaluate the vascular anatomy. RADIATION DOSE REDUCTION: This exam was performed according to the departmental dose-optimization program which includes automated exposure control, adjustment of the mA and/or kV according to patient size and/or use of iterative reconstruction technique. CONTRAST:  85m OMNIPAQUE IOHEXOL 350 MG/ML SOLN COMPARISON:  10/07/2022. FINDINGS: Cardiovascular: Satisfactory opacification of the pulmonary arteries. There is a filling defect within the segmental branch of the right lower lobe pulmonary artery, image 58/11 and image 157/6. No signs of right heart strain. Heart size is normal. No pericardial effusion. Aortic atherosclerosis and coronary artery calcifications. Mediastinum/Nodes: No enlarged mediastinal, hilar, or axillary lymph nodes. Nodule arising off the lower pole of right lobe of thyroid gland measures 1.7 cm, image 41/8. Recommend thyroid [place](ref: J Am Coll Radiol. 2015 Feb;12(2): 143-50). The trachea and esophagus demonstrate no significant findings. Lungs/Pleura: No pleural fluid. No airspace disease. Mild scar versus subsegmental atelectasis identified in the lateral right lower lobe. No suspicious pulmonary nodule or mass identified. Upper Abdomen:  No acute abnormality.  Previous cholecystectomy. Musculoskeletal: Status post bilateral shoulder arthroplasty. Left chest wall port a catheter identified. No acute or suspicious osseous findings. Review of the MIP images confirms the above findings. IMPRESSION: 1. Examination is positive for acute pulmonary embolus within a segmental branch of the right lower lobe pulmonary artery. No signs of right heart strain. 2. Coronary artery calcifications noted. 3. 1.7 cm right lobe of thyroid nodule. Recommend thyroid [place] 4.  Aortic Atherosclerosis (ICD10-I70.0). Electronically Signed   By: TKerby MoorsM.D.   On: 12/07/2022 09:11     Scheduled Meds:  busPIRone  15 mg Oral TID   Chlorhexidine Gluconate Cloth  6 each Topical Daily   citalopram  40 mg Oral Daily   influenza vac split quadrivalent PF  0.5 mL Intramuscular Tomorrow-1000   insulin aspart  0-9 Units Subcutaneous Q4H   pantoprazole  40 mg Oral QAC breakfast   [START ON 12/09/2022] pneumococcal 23 valent vaccine  0.5 mL Intramuscular Tomorrow-1000   propranolol  20 mg Oral BID   sodium chloride (PF)       Continuous Infusions:  heparin 1,300 Units/hr (12/08/22 0430)   lactated ringers       LOS: 0 days   Time spent: 3[place] MD [facility] To contact the attending provider between 7A-7P or the covering provider during after hours 7P-7A, please log into the web site [email or web address] and access using universal [place] password for that web site. If you do not have the password, please call the hospital operator.  12/08/2022, 12:45 PM

## 2022-12-09 ENCOUNTER — Other Ambulatory Visit (HOSPITAL_COMMUNITY): Payer: Self-pay

## 2022-12-09 DIAGNOSIS — I2693 Single subsegmental pulmonary embolism without acute cor pulmonale: Secondary | ICD-10-CM | POA: Diagnosis not present

## 2022-12-09 LAB — COMPREHENSIVE METABOLIC PANEL
ALT: 38 U/L (ref 0–44)
AST: 35 U/L (ref 15–41)
Albumin: 3.4 g/dL — ABNORMAL LOW (ref 3.5–5.0)
Alkaline Phosphatase: 173 U/L — ABNORMAL HIGH (ref 38–126)
Anion gap: 10 (ref 5–15)
BUN: 17 mg/dL (ref 6–20)
CO2: 25 mmol/L (ref 22–32)
Calcium: 8.7 mg/dL — ABNORMAL LOW (ref 8.9–10.3)
Chloride: 104 mmol/L (ref 98–111)
Creatinine, Ser: 1.04 mg/dL — ABNORMAL HIGH (ref 0.44–1.00)
GFR, Estimated: >60 mL/min (ref >60)
Glucose, Bld: 130 mg/dL — ABNORMAL HIGH (ref 70–99)
Potassium: 4 mmol/L (ref 3.5–5.1)
Sodium: 139 mmol/L (ref 135–145)
Total Bilirubin: 0.8 mg/dL (ref 0.3–1.2)
Total Protein: 6.9 g/dL (ref 6.5–8.1)

## 2022-12-09 LAB — HEPARIN LEVEL (UNFRACTIONATED): Heparin Unfractionated: 0.61 IU/mL (ref 0.30–0.70)

## 2022-12-09 LAB — CBC
HCT: 35 % — ABNORMAL LOW (ref 36.0–46.0)
Hemoglobin: 11.4 g/dL — ABNORMAL LOW (ref 12.0–15.0)
MCH: 31.5 pg (ref 26.0–34.0)
MCHC: 32.6 g/dL (ref 30.0–36.0)
MCV: 96.7 fL (ref 80.0–100.0)
Platelets: 248 10*3/uL (ref 150–400)
RBC: 3.62 MIL/uL — ABNORMAL LOW (ref 3.87–5.11)
RDW: 14.8 % (ref 11.5–15.5)
WBC: 9.3 10*3/uL (ref 4.0–10.5)
nRBC: 0 % (ref 0.0–0.2)

## 2022-12-09 LAB — GLUCOSE, CAPILLARY
Glucose-Capillary: 139 mg/dL — ABNORMAL HIGH (ref 70–99)
Glucose-Capillary: 131 mg/dL — ABNORMAL HIGH (ref 70–99)
Glucose-Capillary: 87 mg/dL (ref 70–99)
Glucose-Capillary: 123 mg/dL — ABNORMAL HIGH (ref 70–99)

## 2022-12-09 MED ORDER — FONDAPARINUX SODIUM 10 MG/0.8ML ~~LOC~~ SOLN: 10.0000 mg | SUBCUTANEOUS | 0 refills | Status: DC

## 2022-12-09 MED ORDER — FONDAPARINUX SODIUM 10 MG/0.8ML ~~LOC~~ SOLN
10.0000 mg | SUBCUTANEOUS | 0 refills | Status: DC
  Filled 2022-12-09: qty 68.8, 86d supply, fill #0
  Filled 2022-12-09: qty 3.2, 4d supply, fill #0
  Filled 2023-02-24: qty 6.4, 8d supply, fill #1

## 2022-12-09 MED ORDER — FONDAPARINUX (ARIXTRA) PATIENT EDUCATION KIT: PACK | Freq: Once | Status: DC

## 2022-12-09 MED ORDER — DEXTROMETHORPHAN HBR 15 MG/5ML PO SYRP
10.0000 mL | ORAL_SOLUTION | Freq: Four times a day (QID) | ORAL | 0 refills | Status: DC | PRN
  Filled 2022-12-09: qty 120, 3d supply, fill #0

## 2022-12-09 MED ORDER — HEPARIN SOD (PORK) LOCK FLUSH 100 UNIT/ML IV SOLN
500.0000 [IU] | INTRAVENOUS | Status: AC | PRN
  Administered 2022-12-09: 500 [IU]

## 2022-12-09 MED ORDER — FONDAPARINUX SODIUM 10 MG/0.8ML ~~LOC~~ SOLN
10.0000 mg | SUBCUTANEOUS | Status: DC
  Administered 2022-12-09: 10 mg via SUBCUTANEOUS
  Filled 2022-12-09: qty 0.8

## 2022-12-09 NOTE — TOC Benefit Eligibility Note (Signed)
Patient Teacher, English as a foreign language completed.    The patient is currently admitted and upon discharge could be taking fondaparinux (Arixtra) 10 mg/0.8 ml.  The current 30 day co-pay is $14.00.   The patient is insured through [place], [place] Patient Advocate Specialist [place] Patient Advocate Team Direct Number: [phone]  Fax: [phone]

## 2022-12-09 NOTE — TOC Initial Note (Addendum)
Transition of Care [facility]) - Initial/Assessment Note    Patient Details  Name: Wendy Barber MRN: [id number] Date of Birth: February 14, 1961  Transition of Care [facility]) CM/SW Contact:    Bethann Berkshire, [place] Phone Number: 12/09/2022, 10:34 AM  Clinical Narrative:                  Pt is a 63 y.o. female with medical history significant of appendiceal cancer with metastasis, SMV thrombus, IVC filter, essential tremors, history of bilateral DVTs, CAD, history of GI bleed, prior history of PE in July 2023, pernicious anemia, DM2, autoimmune hepatitis, hypertension, morbid obesity, GERD rheumatoid arthritis. Admitted with Acute pulmonary embolism. TOC consulted for Home Health / DME Needs; no needs identified. TOC will sign off.        Patient Goals and CMS Choice            Expected Discharge Plan and Services                                              Prior Living Arrangements/Services                       Activities of Daily Living Home Assistive Devices/Equipment: Eyeglasses, Contact lenses, Shower chair with back, Walker (specify type), Raised toilet seat with rails ADL Screening (condition at time of admission) Patient's cognitive ability adequate to safely complete daily activities?: Yes Is the patient deaf or have difficulty hearing?: No Does the patient have difficulty seeing, even when wearing glasses/contacts?: No Does the patient have difficulty concentrating, remembering, or making decisions?: No Patient able to express need for assistance with ADLs?: Yes Does the patient have difficulty dressing or bathing?: No Independently performs ADLs?: Yes (appropriate for developmental age) Does the patient have difficulty walking or climbing stairs?: Yes Weakness of Legs: Both Weakness of Arms/Hands: None  Permission Sought/Granted                  Emotional Assessment              Admission diagnosis:  Pulmonary embolism ([place]) [I26.99] Single  subsegmental pulmonary embolism without acute cor pulmonale (HCC) [I26.93] Pulmonary emboli ([place]) [I26.99] Patient Active Problem List   Diagnosis Date Noted   Pulmonary emboli ([place]) 12/08/2022   Pulmonary embolism ([place]) 12/07/2022   Thyroid nodule 12/07/2022   Enteritis 07/23/2022   Fall at home, initial encounter 07/23/2022   AMS (altered mental status) 07/23/2022   Hemorrhoids 06/25/2022   Occasional tremors: Essential tremors 06/23/2022   Hypomagnesemia 06/22/2022   Iron deficiency anemia    Gastritis 06/18/2022   Depression 06/18/2022   UTI (urinary tract infection) 06/16/2022   DVT, bilateral lower limbs ([place]) 06/14/2022   Symptomatic bradycardia 06/13/2022   History of excision of intestinal structure 06/13/2022   CAD (coronary artery disease) 06/13/2022   Acute lower GI bleeding 05/04/2022   AKI (acute kidney injury) ([place]) 05/04/2022   History of deep vein thrombosis (DVT) of lower extremity 05/04/2022   History of pulmonary embolus (PE) 05/04/2022   Anxiety 05/04/2022   Pernicious anemia 05/14/2021   Iron deficiency anemia due to chronic blood loss 05/14/2021   Abdominal wall abscess    Surgical wound infection 01/16/2021   C. 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   DVT of deep femoral vein, left ([place]) 03/19/2020   Pulmonary embolism, bilateral ([place]) 03/19/2020   Presence of IVC filter 03/19/2020   Hepatic encephalopathy ([place]) 11/22/2019   Confusion 11/21/2019   Autoimmune hepatitis ([place]) 11/21/2019   Uncontrolled hypertension 11/21/2019   DM2 (diabetes mellitus, type 2) ([place]) 11/21/2019   Closed right ankle fracture 08/27/2019   Acute deep vein thrombosis (DVT) of femoral vein of left lower extremity ([place]) 08/03/2018   History of  colon cancer 08/03/2018   History of partial colectomy 08/03/2018   Spinal stenosis 08/03/2018   Morbid obesity ([place]) 08/03/2018   Cerebral meningioma ([place]) 01/05/2018   Arthritis of right shoulder region 02/18/2017   History of renal calculi 02/14/2016   Midline low back pain 02/14/2016   Class 3 obesity ([place]) 01/07/2016   Migraine with aura and without status migrainosus, not intractable 01/07/2016   Chronic fatigue syndrome 09/23/2013   GERD (gastroesophageal reflux disease) 09/23/2013   History of hepatitis A 09/23/2013   Rheumatoid arthritis involving multiple sites ([place]) 09/23/2013   PCP:  [name], MD Pharmacy:   [facility] [place], [place] [address] [place] [address] [place] [address] [place] [address] Phone: [phone] Fax: [phone]     Social Determinants of Health (SDOH) Social History: SDOH Screenings   Food Insecurity: No Food Insecurity (12/07/2022)  Housing: Low Risk  (12/07/2022)  Transportation Needs: No Transportation Needs (12/07/2022)  Utilities: Not At Risk (12/07/2022)  Depression (PHQ2-9): High Risk (12/06/2022)  Tobacco Use: Low Risk  (12/08/2022)   SDOH Interventions:     Readmission Risk Interventions    06/26/2022    2:52 PM 06/01/2022    3:59 PM 05/05/2022    8:39 AM  Readmission Risk Prevention Plan  Transportation Screening Complete Complete Complete  PCP or Specialist Appt within 5-7 Days   Complete  Home Care Screening   Complete  Medication Review (RN CM)   Complete  Medication Review Press photographer) Complete Complete   HRI or Home Care Consult Complete Complete   SW Recovery Care/Counseling Consult Complete Complete   Palliative Care Screening Not Applicable Not Applicable   Skilled Nursing Facility Not Applicable Not Applicable

## 2022-12-09 NOTE — Progress Notes (Addendum)
Wendy Barber seems to be doing pretty well.  Unfortunately, IR does not think that they need to do any type of invasive thrombectomy.  She is on heparin.  She has had no problems with cough or shortness of breath.  There is been no bleeding.  There is been no change with her diarrhea.  She is always had diarrhea.  She might be candidate for Arixtra.  I think this would be reasonable option for her.  This to be daily.  She would be on a 30m daily dose.  We can certainly see if insurance would cover Arixtra.  Her labs show BUN 17 creatinine 1.04.  Her albumin is 3.4.  Her blood sugar is 130.  The white cell count is 9.3.  Hemoglobin 9.4.  Platelet count 248,000.  She has had no problems with her appetite.   Her vital signs show temperature of 97.9.  Pulse 70.  Blood pressure 139/86.  Her lungs are clear bilaterally.  She has decent breath sounds bilaterally.  Cardiac exam regular rate and rhythm.  Abdomen is soft.  She is obese.  She has decent bowel sounds.  There is no guarding or rebound tenderness.  There is no palpable hepatosplenomegaly.  Extremities  show some chronic mild edema in the legs.  Neurological exam shows no focal neurological deficits.  [name] has a pulmonary embolism.  She was on Pradaxa.  Again we can certainly try her on Arixtra.  I think this would be reasonable for her.  This would be a daily dose.  We can see if her insurance will cover this.  If not, I would still have her on Lovenox.  However, because of her weight, she would need twice a day Lovenox.  She will continue on the heparin for right now.  I think we can get her on Arixtra and have it covered at home, then she can probably go home over the weekend.   [name] DM  Ephesians 4:2

## 2022-12-09 NOTE — Progress Notes (Signed)
ANTICOAGULATION CONSULT NOTE  Pharmacy Consult for heparin >> fondaparinux Indication: acute PE; hx SMV thrombus, PE/bilateral DVT (PTA pradaxa on hold)  Allergies  Allergen Reactions   Penicillins Shortness Of Breath and Other (See Comments)    Irregular and rapid Heart Rate, too    Alprazolam Hives and Other (See Comments)    Hard to arouse, unresponsiveness also   Ativan [Lorazepam] Swelling and Other (See Comments)    Face & Throat Swelling  Note: tolerates midazolam fine   Corticosteroids Other (See Comments)    "Psychotic behavior"    Erythromycin Other (See Comments)    Severe stomach pain    Gabapentin Other (See Comments)    Made the patient feel depressed   [place]  [Milnacipran] Other (See Comments)    Reaction not noted   Prednisolone Anxiety   Prednisone Anxiety and Other (See Comments)    "Anxiety & Nervous Breakdown"    Patient Measurements: Height: 5' 6"$  (167.6 cm) Weight: 109.3 kg (241 lb) IBW/kg (Calculated) : 59.3 Heparin Dosing Weight: 85 kg  Vital Signs: Temp: 97.9 F (36.6 C) (02/16 0411) Temp Source: Oral (02/16 0411) BP: 139/86 (02/16 0411) Pulse Rate: 70 (02/16 0411)  Labs: Recent Labs    12/07/22 1610 12/07/22 1610 12/07/22 1957 12/07/22 2316 12/08/22 0332 12/08/22 1352 12/08/22 2010 12/09/22 0200  HGB 12.3  --   --   --  10.8*  --   --  11.4*  HCT 38.0  --   --   --  34.0*  --   --  35.0*  PLT 258  --   --   --  240  --   --  248  LABPROT 13.5  --   --   --   --   --   --   --   INR 1.0  --   --   --   --   --   --   --   HEPARINUNFRC  --    < >  --   --  0.74* 0.60 0.62 0.61  CREATININE 1.32*  --   --   --  1.05*  --   --  1.04*  CKTOTAL  --   --   --  88  --   --   --   --   TROPONINIHS  --   --  4 3  --   --   --   --    < > = values in this interval not displayed.     Estimated Creatinine Clearance: 72 mL/min (A) (by C-G formula based on SCr of 1.04 mg/dL (H)).   Medications:  - on Pradaxa 110 mg bid PTA (last dose  taken on 12/07/22 at 0600)  Assessment: Patient is a 62 y.o F with hx recurrent appendiceal cancer, GIB, SMV thrombus, PE/bilateral DVT (s/p IVC filter) on Pradaxa PTA who presented to the ED on 12/07/22 after an outpatient chest CT showed acute PE with no evidence of RHS and LE doppler showed extensive age indeterminate RLE DVT and chronic BL DVTs. Of note she was on full dose LMWH and recently changed to Pradaxa in Dec 2023. She's currently on heparin drip for VTE treatment.   Today, 12/09/2022: Daily heparin level remains therapeutic on 1300 units/hr Hgb remains low but stable; Plt stable WNL no bleeding or infusion issues per RN Plan is to transition to fondaparinux today; with possible discharge later this afternoon vs tomorrow  Goal of Therapy:  Heparin level  0.3-0.7 units/ml Monitor platelets by anticoagulation protocol: Yes   Plan:  Start Arixtra 10 mg SQ once daily Stop IV heparin with first dose of Arixtra monitor for s/sx bleeding   Reuel Boom, PharmD, BCPS [phone] 12/09/2022, 9:36 AM

## 2022-12-09 NOTE — Discharge Summary (Signed)
Physician Discharge Summary  Wendy Barber [id number] DOB: 10-Nov-1960 DOA: 12/07/2022  PCP: [name], MD  Admit date: 12/07/2022 Discharge date: 12/09/2022  Time spent: 55 minutes  Recommendations for Outpatient Follow-up:  Patient does require follow-up of ultrasound of the neck for TI-RADS 3 thyroid nodule as per PCP/Dr. [name] in about 1 year Will require follow-up with Dr. [name] in the outpatient setting with regards to routine cancer follow-up and further management - Does require Chem-12 CBC in the outpatient setting  Discharge Diagnoses:  MAIN problem for hospitalization   Acute DVT considered Pradaxa failure Recurrent appendiceal CA followed at [facility] as well as locally with Dr. [name] Diet-controlled diabetes mellitus HTN Anxiety depression New diagnosis of thyroid nodule  Please see below for itemized issues addressed in [place]- refer to other progress notes for clarity if needed  Discharge Condition: Improved  Diet recommendation: Heart healthy  Filed Weights   12/07/22 1532  Weight: 64.7 kg    62 year old white female appendiceal CA recurrent care doctor under care of Dr. 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 and had an IVC filter placed at some point --Was supposed to go to [facility] 12/24 elective colostomy (has recurrent chronic diarrhea)-turns out she is not really a good candidate for any colostomy  Visited her PCP office 12/06/2022-because of worsening cough concern for PE:-Has been more dizzy than usual cough and sore throat negative COVID test on 12/05/2022 Found to have acute PE and directed to admit to the hospital - Oncology consulted vascular surgery consulted IR consulted  Hospital-Problem based course  Acute pulmonary embolism in the setting of known SMV thrombosis and  prior DVT with IVC filter - Per Dr. [name] transitioning her from gtt.-->Arixtra 24 hourly, cost $14 with co-pay and called into [facility] outpatient pharmacy -Discussed with Dr. Unk Lightning vascular surgery, Dr. Serafina Royals interventional radiology: Given her prior filter was placed in 2005, she is at extraordinarily high risk for breakage of the same and propagation to the lungs and she is not a candidate for  Recurrent appendiceal cancer metastatic - Continuing hydromorphone 4 every 6 as needed for pain - Discharged on usual Lomotil 2 tabs 4 times daily, Imodium to 4 as well as dicyclomine 20 and famotidine 40 twice daily  Thyroid nodule - Characterizing with ultrasound of neck- 2.3 cm ill-defined TI-RADS category 3 nodule versus pseudo nodule in the right lower gland. - rpt scan in 1 year or as per Dr. Margretta Ditty  DM TY 2 -Appears to be diet controlled  HTN - Continue amlodipine 5 mg daily on discharge  Anxiety/depression - Continue BuSpar 15 3 times daily Celexa 40 daily   Discharge Exam: Vitals:   12/09/22 0411 12/09/22 1144  BP: 139/86 (!) 152/84  Pulse: 70 61  Resp: 18 13  Temp: 97.9 F (36.6 C) 97.7 F (36.5 C)  SpO2: 94% 97%    Subj on day of d/c   Awake coherent no distress looks comfortable no chest pain no fever Some cough and asking for a cough suppressant No fever no chills  General Exam on discharge  EOMI NCAT no focal deficit no icterus no pallor neck soft supple thick Chest is clear no wheeze no rales no rhonchi Abdomen slightly tender No lower extremity edema Neurologically intact moving all 4 limbs equally Psych euthymic but flat affect overall  Discharge Instructions   Discharge Instructions  Diet - low sodium heart healthy   Complete by: As directed    Discharge instructions   Complete by: As directed    You have been switched from your prior blood thinner to Arixtra that you should inject every 24 hours   Increase activity slowly    Complete by: As directed       Allergies as of 12/09/2022       Reactions   Penicillins Shortness Of Breath, Other (See Comments)   Irregular and rapid Heart Rate, too   Alprazolam Hives, Other (See Comments)   Hard to arouse, unresponsiveness also   Ativan [lorazepam] Swelling, Other (See Comments)   Face & Throat Swelling  Note: tolerates midazolam fine   Corticosteroids Other (See Comments)   "Psychotic behavior"   Erythromycin Other (See Comments)   Severe stomach pain   Gabapentin Other (See Comments)   Made the patient feel depressed   Savella  [milnacipran] Other (See Comments)   Reaction not noted   Prednisolone Anxiety   Prednisone Anxiety, Other (See Comments)   "Anxiety & Nervous Breakdown"        Medication List     STOP taking these medications    Pradaxa 110 MG Caps Generic drug: Dabigatran Etexilate Mesylate       TAKE these medications    amLODipine 5 MG tablet Commonly known as: NORVASC TAKE 1 TABLET(5 MG) BY MOUTH DAILY What changed: See the new instructions.   buPROPion 300 MG 24 hr tablet Commonly known as: WELLBUTRIN XL TAKE 1 TABLET(300 MG) BY MOUTH DAILY What changed: See the new instructions.   busPIRone 15 MG tablet Commonly known as: BUSPAR Take 1 tablet (15 mg total) by mouth 3 (three) times daily.   citalopram 40 MG tablet Commonly known as: CELEXA Take 1 tablet (40 mg total) by mouth daily.   cyanocobalamin 1000 MCG tablet Take 1 tablet (1,000 mcg total) by mouth daily.   dicyclomine 20 MG tablet Commonly known as: BENTYL TAKE 1 TABLET(20 MG) BY MOUTH THREE TIMES DAILY BEFORE MEALS What changed: See the new instructions.   diphenoxylate-atropine 2.5-0.025 MG tablet Commonly known as: LOMOTIL TAKE 2 TABLETS IN THE MORNING, AT NOON, IN THE EVENING AND AT BEDTIME What changed: See the new instructions.   eszopiclone 2 MG Tabs tablet Commonly known as: LUNESTA TAKE 1 TABLET(2 MG) BY MOUTH AT BEDTIME AS NEEDED FOR  SLEEP What changed:  how much to take how to take this when to take this additional instructions   famotidine 20 MG tablet Commonly known as: PEPCID TAKE 2 TABLETS TWICE A DAY   ferrous sulfate 325 (65 FE) MG tablet Take 1 tablet (325 mg total) by mouth daily.   fondaparinux 10 MG/0.8ML Soln injection Commonly known as: ARIXTRA Inject 0.8 mLs (10 mg total) into the skin daily.   hydrocortisone 2.5 % rectal cream Commonly known as: ANUSOL-HC Place rectally 4 (four) times daily as needed for hemorrhoids or anal itching. Use 4 times daily x5 days, then 4 times daily as needed. What changed:  how much to take reasons to take this additional instructions   HYDROmorphone 4 MG tablet Commonly known as: Dilaudid Take 1 tablet (4 mg total) by mouth every 6 (six) hours as needed for severe pain.   loperamide 2 MG capsule Commonly known as: IMODIUM Take 2 capsules (4 mg total) by mouth 4 (four) times daily as needed for diarrhea or loose stools. What changed: when to take this   meclizine 25 MG  tablet Commonly known as: ANTIVERT TAKE 1 TABLET(25 MG) BY MOUTH THREE TIMES DAILY AS NEEDED FOR DIZZINESS What changed: See the new instructions.   ondansetron 8 MG tablet Commonly known as: ZOFRAN Take 8 mg by mouth See admin instructions. Am yes and 2 day if neneced   orphenadrine 100 MG tablet Commonly known as: NORFLEX TAKE 1 TABLET(100 MG) BY MOUTH AT BEDTIME AS NEEDED FOR MUSCLE SPASMS What changed: See the new instructions.   pantoprazole 40 MG tablet Commonly known as: PROTONIX Take 40 mg by mouth daily before breakfast.   potassium chloride 10 MEQ tablet Commonly known as: KLOR-CON M Take 2 tablets (20 mEq total) by mouth daily.   pramoxine-hydrocortisone 1-1 % rectal cream Commonly known as: PROCTOCREAM-HC Place 1 Application rectally 2 (two) times daily. What changed:  when to take this reasons to take this   promethazine 12.5 MG tablet Commonly known as:  PHENERGAN TAKE 1 TABLET(12.5 MG) BY MOUTH TWICE DAILY AS NEEDED What changed: See the new instructions.   propranolol 20 MG tablet Commonly known as: INDERAL TAKE 1 TABLET(20 MG) BY MOUTH TWICE DAILY What changed: See the new instructions.       Allergies  Allergen Reactions   Penicillins Shortness Of Breath and Other (See Comments)    Irregular and rapid Heart Rate, too    Alprazolam Hives and Other (See Comments)    Hard to arouse, unresponsiveness also   Ativan [Lorazepam] Swelling and Other (See Comments)    Face & Throat Swelling  Note: tolerates midazolam fine   Corticosteroids Other (See Comments)    "Psychotic behavior"    Erythromycin Other (See Comments)    Severe stomach pain    Gabapentin Other (See Comments)    Made the patient feel depressed   Savella  [Milnacipran] Other (See Comments)    Reaction not noted   Prednisolone Anxiety   Prednisone Anxiety and Other (See Comments)    "Anxiety & Nervous Breakdown"      The results of significant diagnostics from this hospitalization (including imaging, microbiology, ancillary and laboratory) are listed below for reference.    Significant Diagnostic Studies: ECHOCARDIOGRAM COMPLETE  Result Date: 12/08/2022    ECHOCARDIOGRAM REPORT   Patient Name:   Wendy Barber Date of Exam: 12/08/2022 Medical Rec #:  [id number]      Height:       66.0 in Accession #:    [id number]     Weight:       241.0 lb Date of Birth:  [birth year]      BSA:          2.165 m Patient Age:    74 years       BP:           150/97 mmHg Patient Gender: F              HR:           67 bpm. Exam Location:  Inpatient Procedure: 2D Echo, Cardiac Doppler and Color Doppler Indications:    I26.02 Pulmonary embolus  History:        Patient has prior history of Echocardiogram examinations. Risk                 Factors:Diabetes and Hypertension.  Sonographer:    Phineas Douglas Referring Phys: [id number] [place]  1. Left ventricular ejection  fraction, by estimation, is 60 to 65%. The left ventricle has normal function. The left ventricle has no regional wall motion  abnormalities. Left ventricular diastolic parameters were normal.  2. Right ventricular systolic function is normal. The right ventricular size is normal. There is normal pulmonary artery systolic pressure.  3. The mitral valve is normal in structure. Trivial mitral valve regurgitation.  4. The aortic valve is calcified. Aortic valve regurgitation is not visualized.  5. The inferior vena cava is normal in size with greater than 50% respiratory variability, suggesting right atrial pressure of 3 mmHg. Comparison(s): The left ventricular function is unchanged. FINDINGS  Left Ventricle: Left ventricular ejection fraction, by estimation, is 60 to 65%. The left ventricle has normal function. The left ventricle has no regional wall motion abnormalities. The left ventricular internal cavity size was normal in size. There is  no left ventricular hypertrophy. Left ventricular diastolic parameters were normal. Right Ventricle: The right ventricular size is normal. Right vetricular wall thickness was not assessed. Right ventricular systolic function is normal. There is normal pulmonary artery systolic pressure. The tricuspid regurgitant velocity is 2.01 m/s, and with an assumed right atrial pressure of 3 mmHg, the estimated right ventricular systolic pressure is [id number] mmHg. Left Atrium: Left atrial size was normal in size. Right Atrium: Right atrial size was normal in size. Pericardium: There is no evidence of pericardial effusion. Mitral Valve: The mitral valve is normal in structure. Trivial mitral valve regurgitation. Tricuspid Valve: The tricuspid valve is normal in structure. Tricuspid valve regurgitation is mild. Aortic Valve: The aortic valve is calcified. Aortic valve regurgitation is not visualized. Pulmonic Valve: The pulmonic valve was not well visualized. Pulmonic valve regurgitation is not  visualized. No evidence of pulmonic stenosis. Aorta: The aortic root and ascending aorta are structurally normal, with no evidence of dilitation. Venous: The inferior vena cava is normal in size with greater than 50% respiratory variability, suggesting right atrial pressure of 3 mmHg. IAS/Shunts: No atrial level shunt detected by color flow Doppler.  LEFT VENTRICLE PLAX 2D LVIDd:         5.10 cm      Diastology LVIDs:         3.10 cm      LV e' medial:    7.18 cm/s LV PW:         1.00 cm      LV E/e' medial:  8.8 LV IVS:        1.10 cm      LV e' lateral:   11.50 cm/s LVOT diam:     2.30 cm      LV E/e' lateral: 5.5 LV SV:         76 LV SV Index:   35 LVOT Area:     4.15 cm  LV Volumes (MOD) LV vol d, MOD A2C: 114.0 ml LV vol d, MOD A4C: 121.0 ml LV vol s, MOD A2C: 48.4 ml LV vol s, MOD A4C: 52.3 ml LV SV MOD A2C:     65.6 ml LV SV MOD A4C:     121.0 ml LV SV MOD BP:      68.4 ml RIGHT VENTRICLE             IVC RV Basal diam:  3.90 cm     IVC diam: 1.50 cm RV S prime:     16.30 cm/s TAPSE (M-mode): 2.0 cm LEFT ATRIUM             Index        RIGHT ATRIUM           Index LA diam:  4.00 cm 1.85 cm/m   RA Area:     16.30 cm LA Vol (A2C):   61.5 ml 28.41 ml/m  RA Volume:   38.10 ml  17.60 ml/m LA Vol (A4C):   66.0 ml 30.49 ml/m LA Biplane Vol: 66.3 ml 30.63 ml/m  AORTIC VALVE LVOT Vmax:   76.10 cm/s LVOT Vmean:  53.800 cm/s LVOT VTI:    0.184 m  AORTA Ao Root diam: 3.30 cm Ao Asc diam:  3.20 cm MITRAL VALVE               TRICUSPID VALVE MV Area (PHT): 2.36 cm    TR Peak grad:   16.2 mmHg MV Decel Time: 321 msec    TR Vmax:        201.00 cm/s MV E velocity: 63.10 cm/s MV A velocity: 66.90 cm/s  SHUNTS MV E/A ratio:  0.94        Systemic VTI:  0.18 m                            Systemic Diam: 2.30 cm Dorris Carnes MD Electronically signed by Dorris Carnes MD Signature Date/Time: 12/08/2022/5:21:34 PM    Final    CT VENOGRAM ABD/PEL  Result Date: 12/08/2022 CLINICAL DATA:  62 year old with history of appendiceal  cancer. History of bilateral lower extremity DVT with iliac and IVC thrombosis. Patient has an IVC filter. EXAM: CT VENOGRAM ABDOMEN AND PELVIS TECHNIQUE: Multiplanar images were obtained of the abdomen and pelvis following administration of intravenous contrast. Images were obtained during the venous phase of contrast. RADIATION DOSE REDUCTION: This exam was performed according to the departmental dose-optimization program which includes automated exposure control, adjustment of the mA and/or kV according to patient size and/or use of iterative reconstruction technique. CONTRAST:  184m OMNIPAQUE IOHEXOL 350 MG/ML SOLN COMPARISON:  CT venogram 06/15/2022. CT abdomen and pelvis 12/08/2021 FINDINGS: Lower chest: Small dependent densities at the lung bases are suggestive for atelectasis or scarring. No large pleural effusions. Central line tip terminates in the right atrium. Hepatobiliary: Cholecystectomy. Minimal intrahepatic biliary dilatation. Extrahepatic bile duct measures roughly 1.0 cm and similar to the previous examination. Liver has a slightly nodular contour with prominent left hepatic lobe. Findings are similar to the previous examination. No discrete liver lesion. Pancreas: Unremarkable. No pancreatic ductal dilatation or surrounding inflammatory changes. Spleen: Splenectomy Adrenals/Urinary Tract: Normal adrenal glands. Stable dilatation of the left renal pelvis without hydronephrosis. Normal appearance of the urinary bladder. Mild dilatation of the right renal pelvis is stable. Negative for right hydronephrosis. No suspicious renal lesions. Stomach/Bowel: Normal appearance of the stomach. Again noted are extensive postoperative changes involving bowel loops throughout the abdomen and pelvis. Multiple adjacent bowel loops in the anterior abdomen are again noted and there are few dilated loops of bowel in this area that similar to prior examinations. Surgical bowel clips in the right lower quadrant and  rectal region. No evidence to suggest acute bowel obstruction or acute bowel inflammation. Arterial/lymphatic: Mild atherosclerotic disease in the abdominal aorta without aneurysm. Main visceral arteries are patent. Common, internal and external iliac arteries are patent bilaterally. No significant lymph node enlargement in the abdomen or pelvis. Reproductive: Status post hysterectomy. No adnexal masses. Other: Negative for free fluid.  Negative for free air. Musculoskeletal: Foci of low density in the anterior subcutaneous tissues likely related to injection sites. Postoperative changes along the anterior abdominal wall at the midline. Posterior interbody fusion at L4-L5 with  bilateral pedicle screws and interbody device. Mild scoliosis in the thoracolumbar spine. Disc space narrowing lower thoracic spine. IVC: Stable appearance of an IVC filter with the top of the filter at the level of the renal veins. Extensive penetration of the filter legs through the IVC is similar to the prior examination. IVC is patent and there is no definite thrombus within the IVC or within the filter. Bilateral renal veins are patent. Portal and mesenteric veins: No evidence for thrombus or stenosis. Bilateral iliac veins: Mild compression on the left common iliac vein from the left common iliac artery is similar to the prior examination. Thrombus at the junction of the right common femoral vein and right external iliac vein at the level of the inguinal ligament. No other iliac vein thrombus. Right upper thigh: Thrombus in the right common femoral vein that extends to the junction of the right external iliac vein. Evidence of thrombus in the right profunda femoral vein. Left upper thigh: No significant thrombus in the proximal left femoral veins. IMPRESSION: 1. Thrombus in the proximal right femoral veins. The thrombus extends proximally to the junction of the right common femoral vein and right external iliac vein. No other significant  iliac vein thrombus. 2. IVC is patent without thrombus. 3. Stable appearance of the IVC filter with extensive filter leg penetration outside of the IVC. IVC filter findings are similar to the prior examination. 4. No acute abnormality in the abdomen or pelvis. Extensive postoperative bowel changes without evidence for acute bowel obstruction or inflammation. 5. Stable nodularity of the liver which could be associated with cirrhotic changes or post treatment changes in liver. Electronically Signed   By: Markus Daft M.D.   On: 12/08/2022 11:36   US THYROID  Result Date: 12/08/2022 CLINICAL DATA:  Incidental on CT. EXAM: THYROID ULTRASOUND TECHNIQUE: Ultrasound examination of the thyroid gland and adjacent soft tissues was performed. COMPARISON:  None Available. FINDINGS: Parenchymal Echotexture: Mildly heterogenous Isthmus: 0.6 cm Right lobe: 4.4 x 1.8 x 1.9 cm Left lobe: 3.8 x 1.5 x 1.1 cm _________________________________________________________ Estimated total number of nodules >/= 1 cm: 1 Number of spongiform nodules >/=  2 cm not described below (TR1): 0 Number of mixed cystic and solid nodules >/= 1.5 cm not described below (Bangs): 0 _________________________________________________________ Nodule # 1: Location: Right; Inferior Maximum size: 2.3 cm; Other 2 dimensions: 1.5 x 2.3 cm Composition: solid/almost completely solid (2) Echogenicity: isoechoic (1) Shape: not taller-than-wide (0) Margins: ill-defined (0) Echogenic foci: none (0) ACR TI-RADS total points: 3. ACR TI-RADS risk category: TR3 (3 points). ACR TI-RADS recommendations: *Given size (>/= 1.5 - 2.4 cm) and appearance, a follow-up ultrasound in 1 year should be considered based on TI-RADS criteria. _________________________________________________________ IMPRESSION: The nodule seen on CT imaging corresponds with a 2.3 cm ill-defined TI-RADS category 3 nodule versus pseudo nodule in the right lower gland. This lesion meets criteria for imaging  surveillance. Recommend follow-up ultrasound in 1 years. The above is in keeping with the ACR TI-RADS recommendations - J Am Coll Radiol 2017;14:587-595. Electronically Signed   By: Jacqulynn Cadet M.D.   On: 12/08/2022 06:00   VAS [place] LOWER EXTREMITY VENOUS (DVT)  Result Date: 12/07/2022  Lower Venous DVT Study Patient Name:  CHIYEKO KEMPA  Date of Exam:   12/07/2022 Medical Rec #: [id number]       Accession #:    [id number] Date of Birth: 1961-05-27       Patient Gender: F Patient Age:   61 years Exam  Location:  [facility] Procedure:      VAS [place] LOWER EXTREMITY VENOUS (DVT) Referring Phys: JEFFREY GREENE --------------------------------------------------------------------------------  Indications: Pain.  Risk Factors: Hx of DVT & PE (IVC filter in place) Recurrent appendiceal cancer. Anticoagulation: Pradaxa. Limitations: Poor ultrasound/tissue interface. Comparison Study: Previous exam on 07/28/22 was positive for chronic DVT in BLE. Performing Technologist: Rogelia Rohrer RVT, RDMS  Examination Guidelines: A complete evaluation includes B-mode imaging, spectral Doppler, color Doppler, and power Doppler as needed of all accessible portions of each vessel. Bilateral testing is considered an integral part of a complete examination. Limited examinations for reoccurring indications may be performed as noted. The reflux portion of the exam is performed with the patient in reverse Trendelenburg.  +---------+---------------+---------+-----------+----------+-----------------+ RIGHT    CompressibilityPhasicitySpontaneityPropertiesThrombus Aging    +---------+---------------+---------+-----------+----------+-----------------+ CFV      None           No       No                   Age Indeterminate +---------+---------------+---------+-----------+----------+-----------------+ SFJ      Partial                                      Age Indeterminate  +---------+---------------+---------+-----------+----------+-----------------+ FV Prox  Full           No       Yes                                    +---------+---------------+---------+-----------+----------+-----------------+ FV Mid   Full           Yes      Yes                                    +---------+---------------+---------+-----------+----------+-----------------+ FV DistalFull           Yes      Yes                                    +---------+---------------+---------+-----------+----------+-----------------+ PFV      None           No       No                   Age Indeterminate +---------+---------------+---------+-----------+----------+-----------------+ POP      Partial        No       Yes                  Chronic           +---------+---------------+---------+-----------+----------+-----------------+ PTV      Full                                                           +---------+---------------+---------+-----------+----------+-----------------+ PERO     Full                                                           +---------+---------------+---------+-----------+----------+-----------------+  EIV      Partial        No       No                   Age Indeterminate +---------+---------------+---------+-----------+----------+-----------------+ Proximal portion of EIV appears patent by color and doppler  +---------+---------------+---------+-----------+----------+--------------+ LEFT     CompressibilityPhasicitySpontaneityPropertiesThrombus Aging +---------+---------------+---------+-----------+----------+--------------+ CFV      Full           Yes      Yes                                 +---------+---------------+---------+-----------+----------+--------------+ SFJ      Partial                                      Chronic        +---------+---------------+---------+-----------+----------+--------------+ FV Prox   Full           Yes      Yes                                 +---------+---------------+---------+-----------+----------+--------------+ FV Mid   Full           Yes      Yes                                 +---------+---------------+---------+-----------+----------+--------------+ FV DistalFull           Yes      Yes                                 +---------+---------------+---------+-----------+----------+--------------+ PFV      Full                                                        +---------+---------------+---------+-----------+----------+--------------+ POP      Full           Yes      Yes                                 +---------+---------------+---------+-----------+----------+--------------+ PTV      Full                                                        +---------+---------------+---------+-----------+----------+--------------+ PERO     Full                                                        +---------+---------------+---------+-----------+----------+--------------+     Summary: BILATERAL: -No evidence of popliteal cyst, bilaterally. RIGHT: - Findings consistent with age indeterminate deep  vein thrombosis involving the EIV, right common femoral vein, SF junction, and right proximal profunda vein. - Findings consistent with chronic deep vein thrombosis involving the right popliteal vein.  LEFT: - Findings consistent with chronic deep vein thrombosis involving the SF junction.  *See table(s) above for measurements and observations. Electronically signed by Monica Martinez MD on 12/07/2022 at 11:14:20 AM.    Final    CT Angio Chest W/Cm &/Or Wo Cm  Result Date: 12/07/2022 CLINICAL DATA:  Rule out pulmonary embolism. Dyspnea. Weakness. History of DVT. EXAM: CT ANGIOGRAPHY CHEST WITH CONTRAST TECHNIQUE: Multidetector CT imaging of the chest was performed using the standard protocol during bolus administration of intravenous contrast.  Multiplanar CT image reconstructions and MIPs were obtained to evaluate the vascular anatomy. RADIATION DOSE REDUCTION: This exam was performed according to the departmental dose-optimization program which includes automated exposure control, adjustment of the mA and/or kV according to patient size and/or use of iterative reconstruction technique. CONTRAST:  78m OMNIPAQUE IOHEXOL 350 MG/ML SOLN COMPARISON:  10/07/2022. FINDINGS: Cardiovascular: Satisfactory opacification of the pulmonary arteries. There is a filling defect within the segmental branch of the right lower lobe pulmonary artery, image 58/11 and image 157/6. No signs of right heart strain. Heart size is normal. No pericardial effusion. Aortic atherosclerosis and coronary artery calcifications. Mediastinum/Nodes: No enlarged mediastinal, hilar, or axillary lymph nodes. Nodule arising off the lower pole of right lobe of thyroid gland measures 1.7 cm, image 41/8. Recommend thyroid [place](ref: J Am Coll Radiol. 2015 Feb;12(2): 143-50). The trachea and esophagus demonstrate no significant findings. Lungs/Pleura: No pleural fluid. No airspace disease. Mild scar versus subsegmental atelectasis identified in the lateral right lower lobe. No suspicious pulmonary nodule or mass identified. Upper Abdomen: No acute abnormality.  Previous cholecystectomy. Musculoskeletal: Status post bilateral shoulder arthroplasty. Left chest wall port a catheter identified. No acute or suspicious osseous findings. Review of the MIP images confirms the above findings. IMPRESSION: 1. Examination is positive for acute pulmonary embolus within a segmental branch of the right lower lobe pulmonary artery. No signs of right heart strain. 2. Coronary artery calcifications noted. 3. 1.7 cm right lobe of thyroid nodule. Recommend thyroid [place] 4.  Aortic Atherosclerosis (ICD10-I70.0). Electronically Signed   By: TKerby MoorsM.D.   On: 12/07/2022 09:11    Microbiology: No results found for  this or any previous visit (from the past 240 hour(s)).   Labs: Basic Metabolic Panel: Recent Labs  Lab 12/06/22 1108 12/07/22 1610 12/07/22 2316 12/08/22 0332 12/09/22 0200  NA 140 139  --  136 139  K 3.9 3.9  --  3.8 4.0  CL 104 105  --  103 104  CO2 30 24  --  24 25  GLUCOSE 180* 190*  --  173* 130*  BUN 21 25*  --  25* 17  CREATININE 1.07 1.32*  --  1.05* 1.04*  CALCIUM 9.6 8.9  --  8.5* 8.7*  MG  --   --  2.0 1.9  --   PHOS  --   --  4.6 4.1  --    Liver Function Tests: Recent Labs  Lab 12/08/22 0332 12/09/22 0200  AST 41 35  ALT 40 38  ALKPHOS 194* 173*  BILITOT 0.7 0.8  PROT 6.9 6.9  ALBUMIN 3.3* 3.4*   No results for input(s): "LIPASE", "AMYLASE" in the last 168 hours. No results for input(s): "AMMONIA" in the last 168 hours. CBC: Recent Labs  Lab 12/06/22 1108 12/07/22 1610 12/08/22 [id number]  12/09/22 0200  WBC 8.6 8.8 10.5 9.3  HGB 12.7 12.3 10.8* 11.4*  HCT 38.9 38.0 34.0* 35.0*  MCV 94.6 96.7 96.6 96.7  PLT 274.0 258 240 248   Cardiac Enzymes: Recent Labs  Lab 12/07/22 2316  CKTOTAL 88   BNP: BNP (last 3 results) Recent Labs    12/07/22 1957  BNP 40.6    ProBNP (last 3 results) No results for input(s): "PROBNP" in the last 8760 hours.  CBG: Recent Labs  Lab 12/08/22 2002 12/08/22 2356 12/09/22 0403 12/09/22 0738 12/09/22 1138  GLUCAP 191* 81 131* 87 123*       Signed:  Nita Sells MD   [facility] 12/09/2022, 1:02 PM

## 2022-12-09 NOTE — Progress Notes (Signed)
Pt ambulate in the hallway independently about 250 ft., stable during ambulation, HR in the 80s, c/o of dizziness which is normal for her per pt. No acute distress noted. Discharged instructions gone over with pt, and all concerns/questions answered by the RN.

## 2022-12-12 ENCOUNTER — Telehealth: Payer: Self-pay

## 2022-12-12 ENCOUNTER — Other Ambulatory Visit (HOSPITAL_COMMUNITY): Payer: Self-pay

## 2022-12-12 ENCOUNTER — Other Ambulatory Visit: Payer: Self-pay

## 2022-12-12 NOTE — Transitions of Care (Post Inpatient/ED Visit) (Signed)
   12/12/2022  Name: Wendy Barber MRN: [id number] DOB: [birth year]  Today's TOC FU Call Status: Today's TOC FU Call Status:: Successful TOC FU Call Competed TOC FU Call Complete Date: 12/12/22  Transition Care Management Follow-up Telephone Call Date of Discharge: 12/09/22 Discharge Facility: [facility]) Type of Discharge: Inpatient Admission Primary Inpatient Discharge Diagnosis:: Acute DVT How have you been since you were released from the hospital?: Better Any questions or concerns?: No  Items Reviewed: Did you receive and understand the discharge instructions provided?: Yes Medications obtained and verified?: Yes (Medications Reviewed) Any new allergies since your discharge?: No Dietary orders reviewed?: NA Do you have support at home?: Yes People in Home: significant other  Home Care and Equipment/Supplies: [place] Ordered?: NA Any new equipment or medical supplies ordered?: NA  Functional Questionnaire: Do you need assistance with bathing/showering or dressing?: No Do you need assistance with meal preparation?: No Do you need assistance with eating?: No Do you have difficulty maintaining continence: No Do you need assistance with getting out of bed/getting out of a chair/moving?: No Do you have difficulty managing or taking your medications?: No  Folllow up appointments reviewed: PCP Follow-up appointment confirmed?: NA Specialist Hospital Follow-up appointment confirmed?: Yes Date of Specialist follow-up appointment?: 12/19/22 Follow-Up Specialty Provider:: Dr [name] Do you need transportation to your follow-up appointment?: No Do you understand care options if your condition(s) worsen?: Yes-patient verbalized understanding    Francisco LPN [place] Direct Dial [phone]

## 2022-12-16 ENCOUNTER — Other Ambulatory Visit: Payer: Self-pay | Admitting: Family Medicine

## 2022-12-16 DIAGNOSIS — H8112 Benign paroxysmal vertigo, left ear: Secondary | ICD-10-CM

## 2022-12-19 ENCOUNTER — Inpatient Hospital Stay: Payer: Medicare Other

## 2022-12-19 ENCOUNTER — Inpatient Hospital Stay: Payer: Medicare Other | Attending: Hematology & Oncology

## 2022-12-19 ENCOUNTER — Encounter: Payer: Self-pay | Admitting: Hematology & Oncology

## 2022-12-19 ENCOUNTER — Other Ambulatory Visit: Payer: Self-pay

## 2022-12-19 ENCOUNTER — Inpatient Hospital Stay (HOSPITAL_BASED_OUTPATIENT_CLINIC_OR_DEPARTMENT_OTHER): Payer: Medicare Other | Admitting: Hematology & Oncology

## 2022-12-19 DIAGNOSIS — Z888 Allergy status to other drugs, medicaments and biological substances status: Secondary | ICD-10-CM | POA: Diagnosis not present

## 2022-12-19 DIAGNOSIS — I2693 Single subsegmental pulmonary embolism without acute cor pulmonale: Secondary | ICD-10-CM | POA: Insufficient documentation

## 2022-12-19 DIAGNOSIS — R0602 Shortness of breath: Secondary | ICD-10-CM | POA: Diagnosis not present

## 2022-12-19 DIAGNOSIS — R5383 Other fatigue: Secondary | ICD-10-CM | POA: Diagnosis not present

## 2022-12-19 DIAGNOSIS — F32A Depression, unspecified: Secondary | ICD-10-CM | POA: Diagnosis not present

## 2022-12-19 DIAGNOSIS — D510 Vitamin B12 deficiency anemia due to intrinsic factor deficiency: Secondary | ICD-10-CM | POA: Insufficient documentation

## 2022-12-19 DIAGNOSIS — I2699 Other pulmonary embolism without acute cor pulmonale: Secondary | ICD-10-CM | POA: Diagnosis not present

## 2022-12-19 DIAGNOSIS — R059 Cough, unspecified: Secondary | ICD-10-CM | POA: Insufficient documentation

## 2022-12-19 DIAGNOSIS — Z7901 Long term (current) use of anticoagulants: Secondary | ICD-10-CM | POA: Insufficient documentation

## 2022-12-19 DIAGNOSIS — I82412 Acute embolism and thrombosis of left femoral vein: Secondary | ICD-10-CM

## 2022-12-19 DIAGNOSIS — R109 Unspecified abdominal pain: Secondary | ICD-10-CM | POA: Diagnosis not present

## 2022-12-19 DIAGNOSIS — Z880 Allergy status to penicillin: Secondary | ICD-10-CM | POA: Insufficient documentation

## 2022-12-19 DIAGNOSIS — K55059 Acute (reversible) ischemia of intestine, part and extent unspecified: Secondary | ICD-10-CM | POA: Insufficient documentation

## 2022-12-19 DIAGNOSIS — C786 Secondary malignant neoplasm of retroperitoneum and peritoneum: Secondary | ICD-10-CM

## 2022-12-19 DIAGNOSIS — I82401 Acute embolism and thrombosis of unspecified deep veins of right lower extremity: Secondary | ICD-10-CM | POA: Insufficient documentation

## 2022-12-19 DIAGNOSIS — Z79899 Other long term (current) drug therapy: Secondary | ICD-10-CM | POA: Diagnosis not present

## 2022-12-19 DIAGNOSIS — K591 Functional diarrhea: Secondary | ICD-10-CM

## 2022-12-19 DIAGNOSIS — H8112 Benign paroxysmal vertigo, left ear: Secondary | ICD-10-CM

## 2022-12-19 DIAGNOSIS — C181 Malignant neoplasm of appendix: Secondary | ICD-10-CM | POA: Insufficient documentation

## 2022-12-19 DIAGNOSIS — R300 Dysuria: Secondary | ICD-10-CM | POA: Insufficient documentation

## 2022-12-19 DIAGNOSIS — Z881 Allergy status to other antibiotic agents status: Secondary | ICD-10-CM | POA: Insufficient documentation

## 2022-12-19 DIAGNOSIS — R197 Diarrhea, unspecified: Secondary | ICD-10-CM | POA: Insufficient documentation

## 2022-12-19 DIAGNOSIS — C772 Secondary and unspecified malignant neoplasm of intra-abdominal lymph nodes: Secondary | ICD-10-CM

## 2022-12-19 DIAGNOSIS — M549 Dorsalgia, unspecified: Secondary | ICD-10-CM | POA: Diagnosis not present

## 2022-12-19 DIAGNOSIS — Z95828 Presence of other vascular implants and grafts: Secondary | ICD-10-CM

## 2022-12-19 LAB — FERRITIN: Ferritin: 78 ng/mL (ref 11–307)

## 2022-12-19 LAB — RETICULOCYTES
Immature Retic Fract: 14.2 % (ref 2.3–15.9)
RBC.: 3.77 MIL/uL — ABNORMAL LOW (ref 3.87–5.11)
Retic Count, Absolute: 101 10*3/uL (ref 19.0–186.0)
Retic Ct Pct: 2.7 % (ref 0.4–3.1)

## 2022-12-19 LAB — CMP (CANCER CENTER ONLY)
ALT: 21 U/L (ref 0–44)
AST: 19 U/L (ref 15–41)
Albumin: 4 g/dL (ref 3.5–5.0)
Alkaline Phosphatase: 164 U/L — ABNORMAL HIGH (ref 38–126)
Anion gap: 9 (ref 5–15)
BUN: 23 mg/dL — ABNORMAL HIGH (ref 6–20)
CO2: 28 mmol/L (ref 22–32)
Calcium: 9.5 mg/dL (ref 8.9–10.3)
Chloride: 103 mmol/L (ref 98–111)
Creatinine: 1.1 mg/dL — ABNORMAL HIGH (ref 0.44–1.00)
GFR, Estimated: 58 mL/min — ABNORMAL LOW (ref >60)
Glucose, Bld: 175 mg/dL — ABNORMAL HIGH (ref 70–99)
Potassium: 4.1 mmol/L (ref 3.5–5.1)
Sodium: 140 mmol/L (ref 135–145)
Total Bilirubin: 0.4 mg/dL (ref 0.3–1.2)
Total Protein: 7.1 g/dL (ref 6.5–8.1)

## 2022-12-19 LAB — CBC WITH DIFFERENTIAL (CANCER CENTER ONLY)
Abs Immature Granulocytes: 0.08 10*3/uL — ABNORMAL HIGH (ref 0.00–0.07)
Basophils Absolute: 0.1 10*3/uL (ref 0.0–0.1)
Basophils Relative: 1 %
Eosinophils Absolute: 0.3 10*3/uL (ref 0.0–0.5)
Eosinophils Relative: 3 %
HCT: 36.4 % (ref 36.0–46.0)
Hemoglobin: 11.7 g/dL — ABNORMAL LOW (ref 12.0–15.0)
Immature Granulocytes: 1 %
Lymphocytes Relative: 41 %
Lymphs Abs: 3.6 10*3/uL (ref 0.7–4.0)
MCH: 31.1 pg (ref 26.0–34.0)
MCHC: 32.1 g/dL (ref 30.0–36.0)
MCV: 96.8 fL (ref 80.0–100.0)
Monocytes Absolute: 0.9 10*3/uL (ref 0.1–1.0)
Monocytes Relative: 10 %
Neutro Abs: 3.9 10*3/uL (ref 1.7–7.7)
Neutrophils Relative %: 44 %
Platelet Count: 285 10*3/uL (ref 150–400)
RBC: 3.76 MIL/uL — ABNORMAL LOW (ref 3.87–5.11)
RDW: 14.5 % (ref 11.5–15.5)
WBC Count: 8.7 10*3/uL (ref 4.0–10.5)
nRBC: 0 % (ref 0.0–0.2)

## 2022-12-19 LAB — VITAMIN B12: Vitamin B-12: 515 pg/mL (ref 180–914)

## 2022-12-19 MED ORDER — HEPARIN SOD (PORK) LOCK FLUSH 100 UNIT/ML IV SOLN
500.0000 [IU] | Freq: Once | INTRAVENOUS | Status: AC
  Administered 2022-12-19: 500 [IU] via INTRAVENOUS

## 2022-12-19 MED ORDER — SODIUM CHLORIDE 0.9% FLUSH
10.0000 mL | Freq: Once | INTRAVENOUS | Status: AC
  Administered 2022-12-19: 10 mL via INTRAVENOUS

## 2022-12-19 MED ORDER — DIPHENOXYLATE-ATROPINE 2.5-0.025 MG PO TABS: ORAL_TABLET | ORAL | 3 refills | Status: DC

## 2022-12-19 MED ORDER — MECLIZINE HCL 25 MG PO TABS: ORAL_TABLET | ORAL | 0 refills | Status: DC

## 2022-12-19 NOTE — Patient Instructions (Signed)

## 2022-12-19 NOTE — Progress Notes (Signed)
Hematology and Oncology Follow Up Visit  Wendy Barber [id number] [birth year] 62 y.o. 12/19/2022   Principle Diagnosis:  History of metastatic appendiceal cancer  -- recurrent  Superior mesenteric vein thrombus Tthromboembolism of the RIGHT leg Pernicious anemia Acute pulmonary embolism-segmental right pulmonary artery  Current Therapy:   HIPEC - Surgery done in [place] in 11/2020 Arixtra 15 mg subcu daily --start on 2/16 2024 Vitamin B12 5000 mcg PO daily      Interim History:  Wendy Barber is in for follow-up.  Unfortunately, she had to be admitted back on 12/07/2022.  She was having problems with shortness of breath.  She had a CT angiogram that was done.  This unfortunately showed a pulmonary embolism in the right subsegmental artery.  This was quite unfortunate.  She has been on the Pradaxa.  We did do a venogram of her abdomen pelvis.  The venogram showed thrombus in the right common femoral vein that extends to the junction of the right external iliac vein.  The IVC was patent without thrombus.  She had stable appearance of the IVC filter.  She had extensive postoperative bowel changes.  She initially was placed on heparin.  Then we switched over to Arixtra.  I thought this was reasonable.  She still has the diarrhea.  I think she will always have diarrhea from past surgeries.  She is does have some leg discomfort.  When she was in the hospital, Interventional radiology did not feel that they needed to intervene with the thromboembolic disease in her right leg.  She is supposed  to go to [place] to see her surgeon at some point soon.  She has had no bleeding.  She has had no nausea or vomiting.  She has this dry cough.  I do not know if this might be secondary to the blood clot in the lung.  The cough is nonproductive.  Overall, I would say that her performance status is probably ECOG 2.    She medications:  Current Outpatient Medications:    amLODipine (NORVASC) 5 MG  tablet, TAKE 1 TABLET(5 MG) BY MOUTH DAILY (Patient taking differently: Take 5 mg by mouth daily.), Disp: 90 tablet, Rfl: 1   buPROPion (WELLBUTRIN XL) 300 MG 24 hr tablet, TAKE 1 TABLET(300 MG) BY MOUTH DAILY (Patient taking differently: Take 300 mg by mouth in the morning.), Disp: 90 tablet, Rfl: 0   busPIRone (BUSPAR) 15 MG tablet, Take 1 tablet (15 mg total) by mouth 3 (three) times daily., Disp: 270 tablet, Rfl: 3   citalopram (CELEXA) 40 MG tablet, Take 1 tablet (40 mg total) by mouth daily., Disp: 90 tablet, Rfl: 1   cyanocobalamin 1000 MCG tablet, Take 1 tablet (1,000 mcg total) by mouth daily., Disp: , Rfl:    dicyclomine (BENTYL) 20 MG tablet, TAKE 1 TABLET(20 MG) BY MOUTH THREE TIMES DAILY BEFORE MEALS (Patient taking differently: Take 20 mg by mouth 3 (three) times daily before meals.), Disp: 90 tablet, Rfl: 4   diphenoxylate-atropine (LOMOTIL) 2.5-0.025 MG tablet, TAKE 2 TABLETS IN THE MORNING, AT NOON, IN THE EVENING AND AT BEDTIME (Patient taking differently: Take 2 tablets by mouth in the morning, at noon, in the evening, and at bedtime.), Disp: 240 tablet, Rfl: 3   eszopiclone (LUNESTA) 2 MG TABS tablet, TAKE 1 TABLET(2 MG) BY MOUTH AT BEDTIME AS NEEDED FOR SLEEP (Patient taking differently: Take 2 mg by mouth at bedtime.), Disp: 30 tablet, Rfl: 3   famotidine (PEPCID) 20 MG tablet, TAKE 2 TABLETS TWICE  A DAY, Disp: 360 tablet, Rfl: 3   ferrous sulfate 325 (65 FE) MG tablet, Take 1 tablet (325 mg total) by mouth daily., Disp: 30 tablet, Rfl: 3   fondaparinux (ARIXTRA) 10 MG/0.8ML SOLN injection, Inject 0.8 mLs (10 mg total) into the skin daily., Disp: 80 mL, Rfl: 0   hydrocortisone (ANUSOL-HC) 2.5 % rectal cream, Place rectally 4 (four) times daily as needed for hemorrhoids or anal itching. Use 4 times daily x5 days, then 4 times daily as needed. (Patient taking differently: Place 1 Application rectally 4 (four) times daily as needed for hemorrhoids or anal itching (may use 4 times a day  for five days, then 4 times a day as needed).), Disp: 30 g, Rfl: 1   loperamide (IMODIUM) 2 MG capsule, Take 2 capsules (4 mg total) by mouth 4 (four) times daily as needed for diarrhea or loose stools. (Patient taking differently: Take 4 mg by mouth in the morning, at noon, in the evening, and at bedtime.), Disp: , Rfl:    ondansetron (ZOFRAN) 8 MG tablet, Take 8 mg by mouth See admin instructions. Am yes and 2 day if neneced, Disp: , Rfl:    orphenadrine (NORFLEX) 100 MG tablet, TAKE 1 TABLET(100 MG) BY MOUTH AT BEDTIME AS NEEDED FOR MUSCLE SPASMS (Patient taking differently: Take 100 mg by mouth at bedtime.), Disp: 30 tablet, Rfl: 2   pantoprazole (PROTONIX) 40 MG tablet, Take 40 mg by mouth daily before breakfast., Disp: , Rfl:    potassium chloride (KLOR-CON M) 10 MEQ tablet, Take 2 tablets (20 mEq total) by mouth daily., Disp: 60 tablet, Rfl: 1   pramoxine-hydrocortisone (PROCTOCREAM-HC) 1-1 % rectal cream, Place 1 Application rectally 2 (two) times daily. (Patient taking differently: Place 1 Application rectally 2 (two) times daily as needed for hemorrhoids or anal itching (if hydrocortisone 2.5% cream is not available).), Disp: 30 g, Rfl: 3   promethazine (PHENERGAN) 12.5 MG tablet, TAKE 1 TABLET(12.5 MG) BY MOUTH TWICE DAILY AS NEEDED (Patient taking differently: Take 12.5 mg by mouth See admin instructions. Take 12.5 mg by mouth at bedtime and an additional 12.5 mg once a day as needed for nausea or vomiting), Disp: 30 tablet, Rfl: 3   propranolol (INDERAL) 20 MG tablet, TAKE 1 TABLET(20 MG) BY MOUTH TWICE DAILY (Patient taking differently: Take 20 mg by mouth in the morning and at bedtime.), Disp: 180 tablet, Rfl: 1   dextromethorphan 15 MG/5ML syrup, Take 10 mLs (30 mg total) by mouth 4 (four) times daily as needed for cough. (Patient not taking: Reported on 12/19/2022), Disp: 120 mL, Rfl: 0   HYDROmorphone (DILAUDID) 4 MG tablet, Take 1 tablet (4 mg total) by mouth every 6 (six) hours as needed  for severe pain. (Patient not taking: Reported on 12/19/2022), Disp: 30 tablet, Rfl: 0   meclizine (ANTIVERT) 25 MG tablet, TAKE 1 TABLET(25 MG) BY MOUTH THREE TIMES DAILY AS NEEDED FOR DIZZINESS, Disp: 60 tablet, Rfl: 0  Allergies:  Allergies  Allergen Reactions   Penicillins Shortness Of Breath and Other (See Comments)    Irregular and rapid Heart Rate, too    Alprazolam Hives and Other (See Comments)    Hard to arouse, unresponsiveness also   Ativan [Lorazepam] Swelling and Other (See Comments)    Face & Throat Swelling  Note: tolerates midazolam fine   Corticosteroids Other (See Comments)    "Psychotic behavior"    Erythromycin Other (See Comments)    Severe stomach pain    Gabapentin Other (See  Comments)    Made the patient feel depressed   Savella  [Milnacipran] Other (See Comments)    Reaction not noted   Prednisolone Anxiety   Prednisone Anxiety and Other (See Comments)    "Anxiety & Nervous Breakdown"    Past Medical History, Surgical history, Social history, and Family History were reviewed and updated.  Review of Systems: Review of Systems  Constitutional:  Positive for fatigue and unexpected weight change.  HENT:  Negative.    Eyes: Negative.   Respiratory: Negative.    Cardiovascular: Negative.   Gastrointestinal:  Positive for abdominal pain and diarrhea.  Endocrine: Negative.   Genitourinary:  Positive for dysuria.   Musculoskeletal:  Positive for back pain.  Skin: Negative.   Neurological: Negative.   Hematological: Negative.   Psychiatric/Behavioral:  Positive for depression.     Physical Exam: Vital signs show a temperature of 98.9.  Pulse 79.  Blood pressure 134/84.  Weight is 244 pounds.  Wt Readings from Last 3 Encounters:  12/07/22 241 lb (109.3 kg)  12/06/22 241 lb 3.2 oz (109.4 kg)  10/12/22 222 lb 1.3 oz (100.7 kg)    Physical Exam Vitals reviewed.  HENT:     Head: Normocephalic and atraumatic.  Eyes:     Pupils: Pupils are equal,  round, and reactive to light.  Cardiovascular:     Rate and Rhythm: Normal rate and regular rhythm.     Heart sounds: Normal heart sounds.  Pulmonary:     Effort: Pulmonary effort is normal.     Breath sounds: Normal breath sounds.  Abdominal:     General: Bowel sounds are normal.     Palpations: Abdomen is soft.     Comments: Abdominal exam shows healed laparotomy scar.  She has no fluid wave.  There is no guarding or rebound tenderness.  She has decent bowel sounds.  There is no palpable liver or spleen tip.  Musculoskeletal:        General: No tenderness or deformity. Normal range of motion.     Cervical back: Normal range of motion.  Lymphadenopathy:     Cervical: No cervical adenopathy.  Skin:    General: Skin is warm and dry.     Findings: No erythema or rash.  Neurological:     Mental Status: She is alert and oriented to person, place, and time.  Psychiatric:        Behavior: Behavior normal.        Thought Content: Thought content normal.        Judgment: Judgment normal.      Lab Results  Component Value Date   WBC 8.7 12/19/2022   HGB 11.7 (L) 12/19/2022   HCT 36.4 12/19/2022   MCV 96.8 12/19/2022   PLT 285 12/19/2022     Chemistry      Component Value Date/Time   NA 140 12/19/2022 1510   K 4.1 12/19/2022 1510   CL 103 12/19/2022 1510   CO2 28 12/19/2022 1510   BUN 23 (H) 12/19/2022 1510   CREATININE 1.10 (H) 12/19/2022 1510      Component Value Date/Time   CALCIUM 9.5 12/19/2022 1510   ALKPHOS 164 (H) 12/19/2022 1510   AST 19 12/19/2022 1510   ALT 21 12/19/2022 1510   BILITOT 0.4 12/19/2022 1510      Impression and Plan: Wendy Barber is a very charming 62 year-old white female.  She has an incredible history.  She has had problems with recurrent appendiceal cancer.  She underwent a HIPEC procedure in [place] I think a couple years ago.  She has had problems from that procedure.  She has had horrible diarrhea.  She has had C. difficile.  Despite C.  difficile being treated, she still has diarrhea.  She has this recurrent thromboembolism.  Again is hard to figure out why she has this is all of her studies have been negative for thrombophilic states.  I suspect she will always need to be on blood thinner.  Hopefully, she will do well on the Arixtra.  I did state that she needs an injectable blood thinner.  Will be very interesting to see what the surgeon up in [place] has to say when he sees her.  We will probably have to repeat a CT angiogram when we see her back to see how the blood clot has resolved.  Hopefully it has resolved.  Again I just feel bad that she has these issues.  I know she is doing all that she can do.  Her husband and family are incredibly supportive.   Volanda Napoleon, MD 2/26/20245:08 PM

## 2022-12-20 ENCOUNTER — Telehealth: Payer: Self-pay

## 2022-12-20 LAB — IRON AND IRON BINDING CAPACITY (CC-WL,HP ONLY)
Iron: 98 ug/dL (ref 28–170)
Saturation Ratios: 26 % (ref 10.4–31.8)
TIBC: 378 ug/dL (ref 250–450)
UIBC: 280 ug/dL (ref 148–442)

## 2022-12-20 NOTE — Telephone Encounter (Signed)
-----   Message from Volanda Napoleon, MD sent at 12/20/2022 12:09 PM EST ----- Please call and let her know that the iron level is okay.  Thanks.  [name]

## 2022-12-21 LAB — CHROMOGRANIN A: Chromogranin A (ng/mL): 379.8 ng/mL — ABNORMAL HIGH (ref 0.0–101.8)

## 2023-01-04 ENCOUNTER — Other Ambulatory Visit: Payer: Self-pay | Admitting: Hematology & Oncology

## 2023-01-05 ENCOUNTER — Encounter: Payer: Self-pay | Admitting: Hematology & Oncology

## 2023-01-09 ENCOUNTER — Ambulatory Visit (HOSPITAL_BASED_OUTPATIENT_CLINIC_OR_DEPARTMENT_OTHER)
Admission: RE | Admit: 2023-01-09 | Discharge: 2023-01-09 | Disposition: A | Discharge status: Home / Self Care | Payer: Medicare Other | Source: Ambulatory Visit | Attending: Hematology & Oncology | Admitting: Hematology & Oncology

## 2023-01-09 ENCOUNTER — Encounter (HOSPITAL_BASED_OUTPATIENT_CLINIC_OR_DEPARTMENT_OTHER): Payer: Self-pay

## 2023-01-09 DIAGNOSIS — I82412 Acute embolism and thrombosis of left femoral vein: Secondary | ICD-10-CM | POA: Insufficient documentation

## 2023-01-09 DIAGNOSIS — I2699 Other pulmonary embolism without acute cor pulmonale: Secondary | ICD-10-CM | POA: Diagnosis present

## 2023-01-09 MED ORDER — IOHEXOL 350 MG/ML SOLN
100.0000 mL | Freq: Once | INTRAVENOUS | Status: AC | PRN
  Administered 2023-01-09: 100 mL via INTRAVENOUS

## 2023-01-10 ENCOUNTER — Encounter: Payer: Self-pay | Admitting: Hematology & Oncology

## 2023-01-10 ENCOUNTER — Telehealth: Payer: Self-pay

## 2023-01-10 NOTE — Telephone Encounter (Signed)
Pt informed via mychart message.

## 2023-01-10 NOTE — Telephone Encounter (Signed)
-----   Message from Volanda Napoleon, MD sent at 01/09/2023  5:44 PM EDT ----- Call - let her know that the blood clot in the lung is smaller.  [name]

## 2023-01-18 ENCOUNTER — Inpatient Hospital Stay: Payer: Medicare Other

## 2023-01-18 ENCOUNTER — Inpatient Hospital Stay: Payer: Medicare Other | Attending: Hematology & Oncology

## 2023-01-18 ENCOUNTER — Other Ambulatory Visit: Payer: Self-pay

## 2023-01-18 ENCOUNTER — Telehealth: Payer: Self-pay

## 2023-01-18 ENCOUNTER — Inpatient Hospital Stay (HOSPITAL_BASED_OUTPATIENT_CLINIC_OR_DEPARTMENT_OTHER): Payer: Medicare Other | Admitting: Hematology & Oncology

## 2023-01-18 VITALS — BP 119/65 | HR 63 | Temp 98.4°F | Resp 17

## 2023-01-18 DIAGNOSIS — I82412 Acute embolism and thrombosis of left femoral vein: Secondary | ICD-10-CM

## 2023-01-18 DIAGNOSIS — R109 Unspecified abdominal pain: Secondary | ICD-10-CM | POA: Insufficient documentation

## 2023-01-18 DIAGNOSIS — Z95828 Presence of other vascular implants and grafts: Secondary | ICD-10-CM

## 2023-01-18 DIAGNOSIS — R197 Diarrhea, unspecified: Secondary | ICD-10-CM | POA: Diagnosis not present

## 2023-01-18 DIAGNOSIS — M79604 Pain in right leg: Secondary | ICD-10-CM | POA: Diagnosis not present

## 2023-01-18 DIAGNOSIS — Z933 Colostomy status: Secondary | ICD-10-CM | POA: Insufficient documentation

## 2023-01-18 DIAGNOSIS — I2699 Other pulmonary embolism without acute cor pulmonale: Secondary | ICD-10-CM

## 2023-01-18 DIAGNOSIS — R5383 Other fatigue: Secondary | ICD-10-CM | POA: Insufficient documentation

## 2023-01-18 DIAGNOSIS — K55059 Acute (reversible) ischemia of intestine, part and extent unspecified: Secondary | ICD-10-CM | POA: Insufficient documentation

## 2023-01-18 DIAGNOSIS — Z888 Allergy status to other drugs, medicaments and biological substances status: Secondary | ICD-10-CM | POA: Insufficient documentation

## 2023-01-18 DIAGNOSIS — C181 Malignant neoplasm of appendix: Secondary | ICD-10-CM | POA: Insufficient documentation

## 2023-01-18 DIAGNOSIS — M79605 Pain in left leg: Secondary | ICD-10-CM | POA: Insufficient documentation

## 2023-01-18 DIAGNOSIS — D510 Vitamin B12 deficiency anemia due to intrinsic factor deficiency: Secondary | ICD-10-CM | POA: Diagnosis not present

## 2023-01-18 DIAGNOSIS — Z7901 Long term (current) use of anticoagulants: Secondary | ICD-10-CM | POA: Diagnosis not present

## 2023-01-18 DIAGNOSIS — M549 Dorsalgia, unspecified: Secondary | ICD-10-CM | POA: Diagnosis not present

## 2023-01-18 DIAGNOSIS — F32A Depression, unspecified: Secondary | ICD-10-CM | POA: Diagnosis not present

## 2023-01-18 DIAGNOSIS — Z881 Allergy status to other antibiotic agents status: Secondary | ICD-10-CM | POA: Diagnosis not present

## 2023-01-18 DIAGNOSIS — Z880 Allergy status to penicillin: Secondary | ICD-10-CM | POA: Diagnosis not present

## 2023-01-18 DIAGNOSIS — Z79899 Other long term (current) drug therapy: Secondary | ICD-10-CM | POA: Diagnosis not present

## 2023-01-18 DIAGNOSIS — R42 Dizziness and giddiness: Secondary | ICD-10-CM | POA: Diagnosis not present

## 2023-01-18 LAB — CBC WITH DIFFERENTIAL (CANCER CENTER ONLY)
Abs Immature Granulocytes: 0.02 10*3/uL (ref 0.00–0.07)
Basophils Absolute: 0.1 10*3/uL (ref 0.0–0.1)
Basophils Relative: 1 %
Eosinophils Absolute: 0.2 10*3/uL (ref 0.0–0.5)
Eosinophils Relative: 3 %
HCT: 39.3 % (ref 36.0–46.0)
Hemoglobin: 12.9 g/dL (ref 12.0–15.0)
Immature Granulocytes: 0 %
Lymphocytes Relative: 45 %
Lymphs Abs: 3.7 10*3/uL (ref 0.7–4.0)
MCH: 30.8 pg (ref 26.0–34.0)
MCHC: 32.8 g/dL (ref 30.0–36.0)
MCV: 93.8 fL (ref 80.0–100.0)
Monocytes Absolute: 0.7 10*3/uL (ref 0.1–1.0)
Monocytes Relative: 9 %
Neutro Abs: 3.5 10*3/uL (ref 1.7–7.7)
Neutrophils Relative %: 42 %
Platelet Count: 277 10*3/uL (ref 150–400)
RBC: 4.19 MIL/uL (ref 3.87–5.11)
RDW: 13.5 % (ref 11.5–15.5)
WBC Count: 8.2 10*3/uL (ref 4.0–10.5)
nRBC: 0 % (ref 0.0–0.2)

## 2023-01-18 LAB — CMP (CANCER CENTER ONLY)
ALT: 26 U/L (ref 0–44)
AST: 22 U/L (ref 15–41)
Albumin: 4 g/dL (ref 3.5–5.0)
Alkaline Phosphatase: 189 U/L — ABNORMAL HIGH (ref 38–126)
Anion gap: 9 (ref 5–15)
BUN: 22 mg/dL — ABNORMAL HIGH (ref 6–20)
CO2: 26 mmol/L (ref 22–32)
Calcium: 9.2 mg/dL (ref 8.9–10.3)
Chloride: 103 mmol/L (ref 98–111)
Creatinine: 1.07 mg/dL — ABNORMAL HIGH (ref 0.44–1.00)
GFR, Estimated: 59 mL/min — ABNORMAL LOW (ref >60)
Glucose, Bld: 160 mg/dL — ABNORMAL HIGH (ref 70–99)
Potassium: 3.8 mmol/L (ref 3.5–5.1)
Sodium: 138 mmol/L (ref 135–145)
Total Bilirubin: 0.4 mg/dL (ref 0.3–1.2)
Total Protein: 7.1 g/dL (ref 6.5–8.1)

## 2023-01-18 LAB — SAMPLE TO BLOOD BANK

## 2023-01-18 LAB — MAGNESIUM: Magnesium: 1.8 mg/dL (ref 1.7–2.4)

## 2023-01-18 LAB — VITAMIN B12: Vitamin B-12: 387 pg/mL (ref 180–914)

## 2023-01-18 MED ORDER — HEPARIN SOD (PORK) LOCK FLUSH 100 UNIT/ML IV SOLN
500.0000 [IU] | Freq: Once | INTRAVENOUS | Status: AC
  Administered 2023-01-18: 500 [IU] via INTRAVENOUS

## 2023-01-18 MED ORDER — SODIUM CHLORIDE 0.9% FLUSH
10.0000 mL | Freq: Once | INTRAVENOUS | Status: AC
  Administered 2023-01-18: 10 mL via INTRAVENOUS

## 2023-01-18 MED ORDER — SODIUM CHLORIDE 0.9 % IV SOLN: INTRAVENOUS | Status: DC

## 2023-01-18 NOTE — Telephone Encounter (Signed)
Received phone call from patient stating that for the past 3 weeks she has been having dizziness, weakness and diarrhea progressively getting worse. Pt states that her dizziness is messing with her eyesight and perception and things are blurry. Pt denies any falls. Pt states she thinks fluids will help. Discussed patient symptoms with Dr. [name] and pt to come in for labs and fluids. Pt verbalized understanding and had no further questions.

## 2023-01-18 NOTE — Patient Instructions (Signed)

## 2023-01-18 NOTE — Patient Instructions (Signed)
Dehydration, Adult Dehydration is a condition in which there is not enough water or other fluids in the body. This happens when a person loses more fluids than they take in. Important organs cannot work right without the right amount of fluids. Any loss of fluids from the body can cause dehydration. Dehydration can be mild, worse, or very bad. It should be treated right away to keep it from getting very bad. What are the causes? Conditions that cause loss of water in the body. They include: Watery poop (diarrhea). Vomiting. Sweating a lot. Fever. Infection. Peeing (urinating) a lot. Not drinking enough fluids. Certain medicines, such as medicines that take extra fluid out of the body (diuretics). Lack of safe drinking water. Not being able to get enough water and food. What increases the risk? Having a long-term (chronic) illness that has not been treated the right way, such as: Diabetes. Heart disease. Kidney disease. Being 65 years of age or older. Having a disability. Living in a place that is high above the ground or sea (high in altitude). The thinner, drier air causes more fluid loss. Doing exercises that put stress on your body for a long time. Being active when in hot places. What are the signs or symptoms? Symptoms of dehydration depend on how bad it is. Mild or worse dehydration Thirst. Dry lips or dry mouth. Feeling dizzy or light-headed. Muscle cramps. Passing little pee or dark pee. Pee may be the color of tea. Headache. Very bad dehydration Changes in skin. Skin may: Be cold to the touch (clammy). Be blotchy or pale. Not go back to normal right after you pinch it and let it go. Little or no tears, pee, or sweat. Fast breathing. Low blood pressure. Weak pulse. Pulse that is more than 100 beats a minute when you are sitting still. Other changes, such as: Feeling very thirsty. Eyes that look hollow (sunken). Cold hands and feet. Being confused. Being very  tired (lethargic) or having trouble waking from sleep. Losing weight. Loss of consciousness. How is this treated? Treatment for this condition depends on how bad your dehydration is. Treatment should start right away. Do not wait until your condition gets very bad. Very bad dehydration is an emergency. You will need to go to a hospital. Mild or worse dehydration can be treated at home. You may be asked to: Drink more fluids. Drink an oral rehydration solution (ORS). This drink gives you the right amount of fluids, salts, and minerals (electrolytes). Very bad dehydration can be treated: With fluids through an IV tube. By correcting low levels of electrolytes in the body. By treating the problem that caused your dehydration. Follow these instructions at home: Oral rehydration solution If told by your doctor, drink an ORS: Make an ORS. Use instructions on the package. Start by drinking small amounts, about  cup (120 mL) every 5-10 minutes. Slowly drink more until you have had the amount that your doctor said to have.  Eating and drinking  Drink enough clear fluid to keep your pee pale yellow. If you were told to drink an ORS, finish the ORS first. Then, start slowly drinking other clear fluids. Drink fluids such as: Water. Do not drink only water. Doing that can make the salt (sodium) level in your body get too low. Water from ice chips you suck on. Fruit juice that you have added water to (diluted). Low-calorie sports drinks. Eat foods that have the right amounts of salts and minerals, such as bananas, oranges, potatoes,   tomatoes, or spinach. Do not drink alcohol. Avoid drinks that have caffeine or sugar. These include:: High-calorie sports drinks. Fruit juice that you did not add water to. Soda. Coffee or energy drinks. Avoid foods that are greasy or have a lot of fat or sugar. General instructions Take over-the-counter and prescription medicines only as told by your doctor. Do  not take sodium tablets. Doing that can make the salt level in your body get too high. Return to your normal activities as told by your doctor. Ask your doctor what activities are safe for you. Keep all follow-up visits. Your doctor may check and change your treatment. Contact a doctor if: You have pain in your belly (abdomen) and the pain: Gets worse. Stays in one place. You have a rash. You have a stiff neck. You get angry or annoyed more easily than normal. You are more tired or have a harder time waking than normal. You feel weak or dizzy. You feel very thirsty. Get help right away if: You have any symptoms of very bad dehydration. You vomit every time you eat or drink. Your vomiting gets worse, does not go away, or you vomit blood or green stuff. You are getting treatment, but symptoms are getting worse. You have a fever. You have a very bad headache. You have: Diarrhea that gets worse or does not go away. Blood in your poop (stool). This may cause poop to look black and tarry. No pee in 6-8 hours. Only a small amount of pee in 6-8 hours, and the pee is very dark. You have trouble breathing. These symptoms may be an emergency. Get help right away. Call 911. Do not wait to see if the symptoms will go away. Do not drive yourself to the hospital. This information is not intended to replace advice given to you by your health care provider. Make sure you discuss any questions you have with your health care provider. Document Revised: 05/09/2022 Document Reviewed: 05/09/2022 Elsevier Patient Education  2023 [employer]  

## 2023-01-18 NOTE — Addendum Note (Signed)
Addended by: Fabio Neighbors A on: 01/18/2023 03:43 PM   Modules accepted: Orders

## 2023-01-19 ENCOUNTER — Encounter: Payer: Self-pay | Admitting: Hematology & Oncology

## 2023-01-19 NOTE — Progress Notes (Signed)
Hematology and Oncology Follow Up Visit  Wendy Barber [id number] 10-10-1961 62 y.o. 01/19/2023   Principle Diagnosis:  History of metastatic appendiceal cancer  -- recurrent  Superior mesenteric vein thrombus Tthromboembolism of the RIGHT leg Pernicious anemia Acute pulmonary embolism-segmental right pulmonary artery  Current Therapy:   HIPEC - Surgery done in [place] in 11/2020 Arixtra 15 mg subcu daily --start on 2/16 2024 Vitamin B12 5000 mcg PO daily      Interim History:  Wendy Barber is in for an unscheduled visit.  She called complain that she was dizzy.  Not sure exactly what was going on.  She still has a lot of diarrhea.  This is chronic.  I really hope that when she sees Dr. Sharon Mt up at [facility], they will think about doing a colostomy on her.  She has very little quality of life as she is not able to really go anywhere because of the diarrhea.  She takes Imodium.  She takes Lomotil.  Nothing really helps the diarrhea.  She also is having some leg pain.  She has bilateral leg pain.  She does have a thromboembolic disease.  She is on Arixtra.  I think back in February, she developed a another pulmonary embolism.  She had been on Pradaxa at that time.  She subsequently was admitted.  She was placed on heparin and then switched over to Arixtra.  IR try to do a thrombectomy but really cannot do this.  She does have a filter in.  She has had no fever.  She has had no bleeding.  She has had no cough or shortness of breath.  She does not have a lot of stamina.  Overall, I would say performance status is probably ECOG 2.      Medications:  Current Outpatient Medications:    amLODipine (NORVASC) 5 MG tablet, TAKE 1 TABLET(5 MG) BY MOUTH DAILY (Patient taking differently: Take 5 mg by mouth daily.), Disp: 90 tablet, Rfl: 1   buPROPion (WELLBUTRIN XL) 300 MG 24 hr tablet, TAKE 1 TABLET(300 MG) BY MOUTH DAILY (Patient taking differently: Take 300 mg by mouth in the  morning.), Disp: 90 tablet, Rfl: 0   busPIRone (BUSPAR) 15 MG tablet, Take 1 tablet (15 mg total) by mouth 3 (three) times daily., Disp: 270 tablet, Rfl: 3   citalopram (CELEXA) 40 MG tablet, Take 1 tablet (40 mg total) by mouth daily., Disp: 90 tablet, Rfl: 1   cyanocobalamin 1000 MCG tablet, Take 1 tablet (1,000 mcg total) by mouth daily., Disp: , Rfl:    dextromethorphan 15 MG/5ML syrup, Take 10 mLs (30 mg total) by mouth 4 (four) times daily as needed for cough., Disp: 120 mL, Rfl: 0   dicyclomine (BENTYL) 20 MG tablet, TAKE 1 TABLET(20 MG) BY MOUTH THREE TIMES DAILY BEFORE MEALS (Patient taking differently: Take 20 mg by mouth 3 (three) times daily before meals.), Disp: 90 tablet, Rfl: 4   diphenoxylate-atropine (LOMOTIL) 2.5-0.025 MG tablet, TAKE 2 TABLETS IN THE MORNING, AT NOON, IN THE EVENING AND AT BEDTIME Strength: 2.5-0.025 mg, Disp: 240 tablet, Rfl: 3   eszopiclone (LUNESTA) 2 MG TABS tablet, TAKE 1 TABLET(2 MG) BY MOUTH AT BEDTIME AS NEEDED FOR SLEEP (Patient taking differently: Take 2 mg by mouth at bedtime.), Disp: 30 tablet, Rfl: 3   famotidine (PEPCID) 20 MG tablet, TAKE 2 TABLETS TWICE A DAY, Disp: 360 tablet, Rfl: 3   ferrous sulfate 325 (65 FE) MG tablet, Take 1 tablet (325 mg total) by mouth  daily., Disp: 30 tablet, Rfl: 3   fondaparinux (ARIXTRA) 10 MG/0.8ML SOLN injection, Inject 0.8 mLs (10 mg total) into the skin daily., Disp: 80 mL, Rfl: 0   hydrocortisone (ANUSOL-HC) 2.5 % rectal cream, Place rectally 4 (four) times daily as needed for hemorrhoids or anal itching. Use 4 times daily x5 days, then 4 times daily as needed. (Patient taking differently: Place 1 Application rectally 4 (four) times daily as needed for hemorrhoids or anal itching (may use 4 times a day for five days, then 4 times a day as needed).), Disp: 30 g, Rfl: 1   HYDROmorphone (DILAUDID) 4 MG tablet, Take 1 tablet (4 mg total) by mouth every 6 (six) hours as needed for severe pain., Disp: 30 tablet, Rfl: 0    loperamide (IMODIUM) 2 MG capsule, Take 2 capsules (4 mg total) by mouth 4 (four) times daily as needed for diarrhea or loose stools. (Patient taking differently: Take 4 mg by mouth in the morning, at noon, in the evening, and at bedtime.), Disp: , Rfl:    meclizine (ANTIVERT) 25 MG tablet, TAKE 1 TABLET(25 MG) BY MOUTH THREE TIMES DAILY AS NEEDED FOR DIZZINESS, Disp: 60 tablet, Rfl: 0   ondansetron (ZOFRAN) 8 MG tablet, Take 8 mg by mouth See admin instructions. Am yes and 2 day if neneced, Disp: , Rfl:    orphenadrine (NORFLEX) 100 MG tablet, TAKE 1 TABLET(100 MG) BY MOUTH AT BEDTIME AS NEEDED FOR MUSCLE SPASMS, Disp: 30 tablet, Rfl: 2   pantoprazole (PROTONIX) 40 MG tablet, Take 40 mg by mouth daily before breakfast., Disp: , Rfl:    potassium chloride (KLOR-CON M) 10 MEQ tablet, Take 2 tablets (20 mEq total) by mouth daily., Disp: 60 tablet, Rfl: 1   pramoxine-hydrocortisone (PROCTOCREAM-HC) 1-1 % rectal cream, Place 1 Application rectally 2 (two) times daily. (Patient taking differently: Place 1 Application rectally 2 (two) times daily as needed for hemorrhoids or anal itching (if hydrocortisone 2.5% cream is not available).), Disp: 30 g, Rfl: 3   promethazine (PHENERGAN) 12.5 MG tablet, TAKE 1 TABLET(12.5 MG) BY MOUTH TWICE DAILY AS NEEDED (Patient taking differently: Take 12.5 mg by mouth See admin instructions. Take 12.5 mg by mouth at bedtime and an additional 12.5 mg once a day as needed for nausea or vomiting), Disp: 30 tablet, Rfl: 3   propranolol (INDERAL) 20 MG tablet, TAKE 1 TABLET(20 MG) BY MOUTH TWICE DAILY (Patient taking differently: Take 20 mg by mouth in the morning and at bedtime.), Disp: 180 tablet, Rfl: 1  Allergies:  Allergies  Allergen Reactions   Penicillins Shortness Of Breath and Other (See Comments)    Irregular and rapid Heart Rate, too    Alprazolam Hives and Other (See Comments)    Hard to arouse, unresponsiveness also   Ativan [Lorazepam] Swelling and Other (See  Comments)    Face & Throat Swelling  Note: tolerates midazolam fine   Corticosteroids Other (See Comments)    "Psychotic behavior"    Erythromycin Other (See Comments)    Severe stomach pain    Gabapentin Other (See Comments)    Made the patient feel depressed   Savella  [Milnacipran] Other (See Comments)    Reaction not noted   Prednisolone Anxiety   Prednisone Anxiety and Other (See Comments)    "Anxiety & Nervous Breakdown"    Past Medical History, Surgical history, Social history, and Family History were reviewed and updated.  Review of Systems: Review of Systems  Constitutional:  Positive for fatigue and  unexpected weight change.  HENT:  Negative.    Eyes: Negative.   Respiratory: Negative.    Cardiovascular: Negative.   Gastrointestinal:  Positive for abdominal pain and diarrhea.  Endocrine: Negative.   Genitourinary:  Positive for dysuria.   Musculoskeletal:  Positive for back pain.  Skin: Negative.   Neurological: Negative.   Hematological: Negative.   Psychiatric/Behavioral:  Positive for depression.     Physical Exam: Vital signs show a temperature of 98.9.  Pulse 79.  Blood pressure 134/84.  Weight is 244 pounds.  Wt Readings from Last 3 Encounters:  12/07/22 241 lb (109.3 kg)  12/06/22 241 lb 3.2 oz (109.4 kg)  10/12/22 222 lb 1.3 oz (100.7 kg)    Physical Exam Vitals reviewed.  HENT:     Head: Normocephalic and atraumatic.  Eyes:     Pupils: Pupils are equal, round, and reactive to light.  Cardiovascular:     Rate and Rhythm: Normal rate and regular rhythm.     Heart sounds: Normal heart sounds.  Pulmonary:     Effort: Pulmonary effort is normal.     Breath sounds: Normal breath sounds.  Abdominal:     General: Bowel sounds are normal.     Palpations: Abdomen is soft.     Comments: Abdominal exam shows healed laparotomy scar.  She has no fluid wave.  There is no guarding or rebound tenderness.  She has decent bowel sounds.  There is no palpable  liver or spleen tip.  Musculoskeletal:        General: No tenderness or deformity. Normal range of motion.     Cervical back: Normal range of motion.  Lymphadenopathy:     Cervical: No cervical adenopathy.  Skin:    General: Skin is warm and dry.     Findings: No erythema or rash.  Neurological:     Mental Status: She is alert and oriented to person, place, and time.  Psychiatric:        Behavior: Behavior normal.        Thought Content: Thought content normal.        Judgment: Judgment normal.      Lab Results  Component Value Date   WBC 8.2 01/18/2023   HGB 12.9 01/18/2023   HCT 39.3 01/18/2023   MCV 93.8 01/18/2023   PLT 277 01/18/2023     Chemistry      Component Value Date/Time   NA 138 01/18/2023 1337   K 3.8 01/18/2023 1337   CL 103 01/18/2023 1337   CO2 26 01/18/2023 1337   BUN 22 (H) 01/18/2023 1337   CREATININE 1.07 (H) 01/18/2023 1337      Component Value Date/Time   CALCIUM 9.2 01/18/2023 1337   ALKPHOS 189 (H) 01/18/2023 1337   AST 22 01/18/2023 1337   ALT 26 01/18/2023 1337   BILITOT 0.4 01/18/2023 1337      Impression and Plan: Wendy Barber is a very charming 61 year-old white female.  She has an incredible history.  She has had problems with recurrent appendiceal cancer.  She underwent a HIPEC procedure in [place] I think a couple years ago.  She has had problems from that procedure.  She has had horrible diarrhea.  She has had C. difficile.  Despite C. difficile being treated, she still has diarrhea.  She has recurrent thromboembolism.  Again is hard to figure out why she has this is all of her studies have been negative for thrombophilic states.  I suspect she  will always need to be on blood thinner.  I am not sure why she has this dizziness.  Her blood pressure is fine.  She may be little bit dehydrated.  She has some IV fluids in the office.  Her pulse is not erratic or rapid or slow.  Again, I think she goes up to [facility] in April to see  Dr. Sharon Mt.  Maybe, he will be able to help with his diarrhea.  Maybe he can do a colostomy on her.  I realize this is not going to be easy surgery given all that she has had done.  We will plan to get her back for regular appointment as scheduled.  She will always call us if she thinks she needs fluids or labs.   Volanda Napoleon, MD 3/28/20244:56 PM

## 2023-01-30 ENCOUNTER — Ambulatory Visit (INDEPENDENT_AMBULATORY_CARE_PROVIDER_SITE_OTHER): Payer: Medicare Other | Admitting: Dermatology

## 2023-01-30 ENCOUNTER — Encounter: Payer: Self-pay | Admitting: Dermatology

## 2023-01-30 DIAGNOSIS — L814 Other melanin hyperpigmentation: Secondary | ICD-10-CM | POA: Diagnosis not present

## 2023-01-30 DIAGNOSIS — L304 Erythema intertrigo: Secondary | ICD-10-CM

## 2023-01-30 DIAGNOSIS — D1801 Hemangioma of skin and subcutaneous tissue: Secondary | ICD-10-CM

## 2023-01-30 DIAGNOSIS — Z1283 Encounter for screening for malignant neoplasm of skin: Secondary | ICD-10-CM | POA: Diagnosis not present

## 2023-01-30 DIAGNOSIS — W908XXA Exposure to other nonionizing radiation, initial encounter: Secondary | ICD-10-CM

## 2023-01-30 DIAGNOSIS — L821 Other seborrheic keratosis: Secondary | ICD-10-CM

## 2023-01-30 DIAGNOSIS — B079 Viral wart, unspecified: Secondary | ICD-10-CM | POA: Diagnosis not present

## 2023-01-30 DIAGNOSIS — D229 Melanocytic nevi, unspecified: Secondary | ICD-10-CM

## 2023-01-30 DIAGNOSIS — X32XXXA Exposure to sunlight, initial encounter: Secondary | ICD-10-CM

## 2023-01-30 DIAGNOSIS — L578 Other skin changes due to chronic exposure to nonionizing radiation: Secondary | ICD-10-CM

## 2023-01-30 MED ORDER — NYSTATIN-TRIAMCINOLONE 100000-0.1 UNIT/GM-% EX OINT: 1.0000 | TOPICAL_OINTMENT | Freq: Two times a day (BID) | CUTANEOUS | 0 refills | Status: DC

## 2023-01-30 NOTE — Progress Notes (Signed)
   New Patient Visit   Subjective  Wendy Barber is a 62 y.o. female who presents for the following: Skin Cancer Screening and Full Body Skin Exam  The patient presents for Total-Body Skin Exam (TBSE) for skin cancer screening and mole check. The patient has spots, moles and lesions to be evaluated, some may be new or changing and the patient has concerns that these could be cancer.  Patient is here for a full body skin check. She has never had her skin checked before. There is a spot on right cheek that has been there for about a year. Denies irritation but would like it removed. No family hx of skin cancer. There are other nevi on back of neck and chest.   The following portions of the chart were reviewed this encounter and updated as appropriate: medications, allergies, medical history  Review of Systems:  No other skin or systemic complaints except as noted in HPI or Assessment and Plan.  Objective  Well appearing patient in no apparent distress; mood and affect are within normal limits.  A full examination was performed including scalp, head, eyes, ears, nose, lips, neck, chest, axillae, abdomen, back, buttocks, bilateral upper extremities, bilateral lower extremities, hands, feet, fingers, toes, fingernails, and toenails. All findings within normal limits unless otherwise noted below.   Relevant physical exam findings are noted in the Assessment and Plan.    Assessment & Plan   LENTIGINES, SEBORRHEIC KERATOSES, HEMANGIOMAS - Benign normal skin lesions - Benign-appearing - Call for any changes  MELANOCYTIC NEVI - Tan-brown and/or pink-flesh-colored symmetric macules and papules - Benign appearing on exam today - Observation - Call clinic for new or changing moles - Recommend daily use of broad spectrum spf 30+ sunscreen to sun-exposed areas.   ACTINIC DAMAGE - Chronic condition, secondary to cumulative UV/sun exposure - diffuse scaly erythematous macules with underlying  dyspigmentation - Recommend daily broad spectrum sunscreen SPF 30+ to sun-exposed areas, reapply every 2 hours as needed.  - Staying in the shade or wearing long sleeves, sun glasses (UVA+UVB protection) and wide brim hats (4-inch brim around the entire circumference of the hat) are also recommended for sun protection.  - Call for new or changing lesions.  SKIN CANCER SCREENING PERFORMED TODAY.    Intertrigo Left Inguinal Area  nystatin-triamcinolone ointment (MYCOLOG) - Left Inguinal Area Apply 1 Application topically 2 (two) times daily. For up to 7 days and then STOP.  Repeat as needed for flares after at least a 2 week break  Viral warts, unspecified type (4) Right Buccal Cheek (2); Left Forehead (2)  Related Procedures Destruction of lesion Complexity: simple   Destruction method: cryotherapy   Informed consent: discussed and consent obtained   Timeout:  patient name, date of birth, surgical site, and procedure verified Lesion destroyed using liquid nitrogen: Yes   Post-procedure details: wound care instructions given    Multiple benign melanocytic nevi  Lentigines  Seborrheic keratosis  Skin exam for malignant neoplasm  Actinic skin damage    No follow-ups on file.    Documentation: I have reviewed the above documentation for accuracy and completeness, and I agree with the above.  Langston Reusing, DO  I, Germaine Pomfret, CMA, am acting as scribe for [employer], DO.

## 2023-02-03 ENCOUNTER — Other Ambulatory Visit: Payer: Self-pay | Admitting: Family Medicine

## 2023-02-03 DIAGNOSIS — H8112 Benign paroxysmal vertigo, left ear: Secondary | ICD-10-CM

## 2023-02-04 ENCOUNTER — Other Ambulatory Visit: Payer: Self-pay | Admitting: Family Medicine

## 2023-02-04 DIAGNOSIS — F411 Generalized anxiety disorder: Secondary | ICD-10-CM

## 2023-02-05 ENCOUNTER — Encounter: Payer: Self-pay | Admitting: Hematology & Oncology

## 2023-02-06 ENCOUNTER — Other Ambulatory Visit: Payer: Self-pay

## 2023-02-06 ENCOUNTER — Inpatient Hospital Stay (HOSPITAL_BASED_OUTPATIENT_CLINIC_OR_DEPARTMENT_OTHER): Payer: Medicare Other | Admitting: Hematology & Oncology

## 2023-02-06 ENCOUNTER — Inpatient Hospital Stay: Payer: Medicare Other

## 2023-02-06 ENCOUNTER — Inpatient Hospital Stay: Payer: Medicare Other | Attending: Hematology & Oncology

## 2023-02-06 ENCOUNTER — Encounter: Payer: Self-pay | Admitting: Hematology & Oncology

## 2023-02-06 VITALS — BP 133/67 | HR 73 | Temp 98.6°F | Resp 17 | Wt 221.0 lb

## 2023-02-06 DIAGNOSIS — D510 Vitamin B12 deficiency anemia due to intrinsic factor deficiency: Secondary | ICD-10-CM | POA: Insufficient documentation

## 2023-02-06 DIAGNOSIS — I2699 Other pulmonary embolism without acute cor pulmonale: Secondary | ICD-10-CM

## 2023-02-06 DIAGNOSIS — I82401 Acute embolism and thrombosis of unspecified deep veins of right lower extremity: Secondary | ICD-10-CM | POA: Diagnosis not present

## 2023-02-06 DIAGNOSIS — C574 Malignant neoplasm of uterine adnexa, unspecified: Secondary | ICD-10-CM | POA: Diagnosis not present

## 2023-02-06 DIAGNOSIS — K55059 Acute (reversible) ischemia of intestine, part and extent unspecified: Secondary | ICD-10-CM | POA: Diagnosis not present

## 2023-02-06 DIAGNOSIS — M549 Dorsalgia, unspecified: Secondary | ICD-10-CM | POA: Diagnosis not present

## 2023-02-06 DIAGNOSIS — C772 Secondary and unspecified malignant neoplasm of intra-abdominal lymph nodes: Secondary | ICD-10-CM | POA: Diagnosis not present

## 2023-02-06 DIAGNOSIS — R5383 Other fatigue: Secondary | ICD-10-CM | POA: Diagnosis not present

## 2023-02-06 DIAGNOSIS — R300 Dysuria: Secondary | ICD-10-CM | POA: Diagnosis not present

## 2023-02-06 DIAGNOSIS — Z8542 Personal history of malignant neoplasm of other parts of uterus: Secondary | ICD-10-CM | POA: Diagnosis not present

## 2023-02-06 DIAGNOSIS — I82412 Acute embolism and thrombosis of left femoral vein: Secondary | ICD-10-CM

## 2023-02-06 DIAGNOSIS — C181 Malignant neoplasm of appendix: Secondary | ICD-10-CM | POA: Insufficient documentation

## 2023-02-06 DIAGNOSIS — R109 Unspecified abdominal pain: Secondary | ICD-10-CM | POA: Insufficient documentation

## 2023-02-06 DIAGNOSIS — Z85038 Personal history of other malignant neoplasm of large intestine: Secondary | ICD-10-CM

## 2023-02-06 DIAGNOSIS — R978 Other abnormal tumor markers: Secondary | ICD-10-CM | POA: Diagnosis not present

## 2023-02-06 DIAGNOSIS — Z7901 Long term (current) use of anticoagulants: Secondary | ICD-10-CM | POA: Diagnosis not present

## 2023-02-06 DIAGNOSIS — K2900 Acute gastritis without bleeding: Secondary | ICD-10-CM

## 2023-02-06 DIAGNOSIS — R197 Diarrhea, unspecified: Secondary | ICD-10-CM | POA: Diagnosis not present

## 2023-02-06 DIAGNOSIS — C786 Secondary malignant neoplasm of retroperitoneum and peritoneum: Secondary | ICD-10-CM

## 2023-02-06 DIAGNOSIS — F32A Depression, unspecified: Secondary | ICD-10-CM | POA: Diagnosis not present

## 2023-02-06 DIAGNOSIS — C257 Malignant neoplasm of other parts of pancreas: Secondary | ICD-10-CM

## 2023-02-06 DIAGNOSIS — E032 Hypothyroidism due to medicaments and other exogenous substances: Secondary | ICD-10-CM

## 2023-02-06 DIAGNOSIS — Z79899 Other long term (current) drug therapy: Secondary | ICD-10-CM | POA: Insufficient documentation

## 2023-02-06 DIAGNOSIS — K7682 Hepatic encephalopathy: Secondary | ICD-10-CM

## 2023-02-06 LAB — CMP (CANCER CENTER ONLY)
ALT: 32 U/L (ref 0–44)
AST: 23 U/L (ref 15–41)
Albumin: 3.8 g/dL (ref 3.5–5.0)
Alkaline Phosphatase: 165 U/L — ABNORMAL HIGH (ref 38–126)
Anion gap: 7 (ref 5–15)
BUN: 18 mg/dL (ref 6–20)
CO2: 30 mmol/L (ref 22–32)
Calcium: 9 mg/dL (ref 8.9–10.3)
Chloride: 103 mmol/L (ref 98–111)
Creatinine: 1.11 mg/dL — ABNORMAL HIGH (ref 0.44–1.00)
GFR, Estimated: 57 mL/min — ABNORMAL LOW (ref >60)
Glucose, Bld: 147 mg/dL — ABNORMAL HIGH (ref 70–99)
Potassium: 3.8 mmol/L (ref 3.5–5.1)
Sodium: 140 mmol/L (ref 135–145)
Total Bilirubin: 0.4 mg/dL (ref 0.3–1.2)
Total Protein: 7.2 g/dL (ref 6.5–8.1)

## 2023-02-06 LAB — CBC WITH DIFFERENTIAL (CANCER CENTER ONLY)
Abs Immature Granulocytes: 0.02 10*3/uL (ref 0.00–0.07)
Basophils Absolute: 0.1 10*3/uL (ref 0.0–0.1)
Basophils Relative: 1 %
Eosinophils Absolute: 0.2 10*3/uL (ref 0.0–0.5)
Eosinophils Relative: 2 %
HCT: 39.1 % (ref 36.0–46.0)
Hemoglobin: 12.4 g/dL (ref 12.0–15.0)
Immature Granulocytes: 0 %
Lymphocytes Relative: 45 %
Lymphs Abs: 3.5 10*3/uL (ref 0.7–4.0)
MCH: 30.1 pg (ref 26.0–34.0)
MCHC: 31.7 g/dL (ref 30.0–36.0)
MCV: 94.9 fL (ref 80.0–100.0)
Monocytes Absolute: 0.7 10*3/uL (ref 0.1–1.0)
Monocytes Relative: 8 %
Neutro Abs: 3.5 10*3/uL (ref 1.7–7.7)
Neutrophils Relative %: 44 %
Platelet Count: 186 10*3/uL (ref 150–400)
RBC: 4.12 MIL/uL (ref 3.87–5.11)
RDW: 13.8 % (ref 11.5–15.5)
WBC Count: 7.9 10*3/uL (ref 4.0–10.5)
nRBC: 0 % (ref 0.0–0.2)

## 2023-02-06 LAB — LACTATE DEHYDROGENASE: LDH: 171 U/L (ref 98–192)

## 2023-02-06 LAB — CEA (IN HOUSE-CHCC): CEA (CHCC-In House): 1.36 ng/mL (ref 0.00–5.00)

## 2023-02-06 NOTE — Progress Notes (Signed)
Hematology and Oncology Follow Up Visit  Aniella Wandrey [id number] [birth year] 62 y.o. 02/06/2023   Principle Diagnosis:  History of metastatic appendiceal cancer  -- recurrent  Superior mesenteric vein thrombus Thromboembolism of the RIGHT leg Pernicious anemia Acute pulmonary embolism-segmental right pulmonary artery  Current Therapy:   HIPEC - Surgery done in [place] in 11/2020 Arixtra 15 mg subcu daily --start on 2/16 2024 Vitamin B12 5000 mcg PO daily      Interim History:  Ms. Puller is in for follow-up.  She is managing despite the diarrhea.  She still has quite a bit of diarrhea.  I think she sees her surgeon, Dr. Floreen Comber, updated [place] in about 3 weeks.  She will talk to him about this.  Is hard to say what can be done.  I would think that she probably would benefit from a colostomy but again I do not know if this is some that could be done.  Of note, is found that she does have an elevated Chromogranin A level.  I will know if this might be a factor.  As such, I might consider somatostatin which might potentially help.  She is on Arixtra.  She is doing pretty well with the Arixtra.  She has had no problems with bleeding.  She says that her left leg does have some discomfort.  I know that she has had thrombus in her right leg in the past.  It may not be a bad idea to think about doing another CT angiogram of her chest sometime in May.  She has had no problems with cough.  Maybe a little bit of chest wall discomfort..  She has had no rashes.  There is been no swollen lymph nodes.  She has had no headache.  Overall, I would have said that her performance status is probably ECOG 1.    Medications:  Current Outpatient Medications:    amLODipine (NORVASC) 5 MG tablet, TAKE 1 TABLET(5 MG) BY MOUTH DAILY (Patient taking differently: Take 5 mg by mouth daily.), Disp: 90 tablet, Rfl: 1   buPROPion (WELLBUTRIN XL) 300 MG 24 hr tablet, TAKE 1 TABLET(300 MG) BY MOUTH DAILY  (Patient taking differently: Take 300 mg by mouth in the morning.), Disp: 90 tablet, Rfl: 0   busPIRone (BUSPAR) 15 MG tablet, Take 1 tablet (15 mg total) by mouth 3 (three) times daily., Disp: 270 tablet, Rfl: 3   citalopram (CELEXA) 40 MG tablet, Take 1 tablet (40 mg total) by mouth daily., Disp: 90 tablet, Rfl: 1   cyanocobalamin 1000 MCG tablet, Take 1 tablet (1,000 mcg total) by mouth daily., Disp: , Rfl:    dicyclomine (BENTYL) 20 MG tablet, TAKE 1 TABLET(20 MG) BY MOUTH THREE TIMES DAILY BEFORE MEALS (Patient taking differently: Take 20 mg by mouth 3 (three) times daily before meals.), Disp: 90 tablet, Rfl: 4   diphenoxylate-atropine (LOMOTIL) 2.5-0.025 MG tablet, TAKE 2 TABLETS IN THE MORNING, AT NOON, IN THE EVENING AND AT BEDTIME Strength: 2.5-0.025 mg, Disp: 240 tablet, Rfl: 3   eszopiclone (LUNESTA) 2 MG TABS tablet, TAKE 1 TABLET(2 MG) BY MOUTH AT BEDTIME AS NEEDED FOR SLEEP (Patient taking differently: Take 2 mg by mouth at bedtime.), Disp: 30 tablet, Rfl: 3   famotidine (PEPCID) 20 MG tablet, TAKE 2 TABLETS TWICE A DAY, Disp: 360 tablet, Rfl: 3   ferrous sulfate 325 (65 FE) MG tablet, Take 1 tablet (325 mg total) by mouth daily., Disp: 30 tablet, Rfl: 3   fondaparinux (ARIXTRA) 10 MG/0.8ML SOLN  injection, Inject 0.8 mLs (10 mg total) into the skin daily., Disp: 80 mL, Rfl: 0   hydrocortisone (ANUSOL-HC) 2.5 % rectal cream, Place rectally 4 (four) times daily as needed for hemorrhoids or anal itching. Use 4 times daily x5 days, then 4 times daily as needed. (Patient taking differently: Place 1 Application rectally 4 (four) times daily as needed for hemorrhoids or anal itching (may use 4 times a day for five days, then 4 times a day as needed).), Disp: 30 g, Rfl: 1   HYDROmorphone (DILAUDID) 4 MG tablet, Take 1 tablet (4 mg total) by mouth every 6 (six) hours as needed for severe pain., Disp: 30 tablet, Rfl: 0   loperamide (IMODIUM) 2 MG capsule, Take 2 capsules (4 mg total) by mouth 4  (four) times daily as needed for diarrhea or loose stools. (Patient taking differently: Take 4 mg by mouth in the morning, at noon, in the evening, and at bedtime.), Disp: , Rfl:    meclizine (ANTIVERT) 25 MG tablet, TAKE 1 TABLET(25 MG) BY MOUTH THREE TIMES DAILY AS NEEDED FOR DIZZINESS, Disp: 60 tablet, Rfl: 0   nystatin-triamcinolone ointment (MYCOLOG), Apply 1 Application topically 2 (two) times daily. For up to 7 days and then STOP.  Repeat as needed for flares after at least a 2 week break, Disp: 30 g, Rfl: 0   ondansetron (ZOFRAN) 8 MG tablet, Take 8 mg by mouth See admin instructions. Am yes and 2 day if neneced, Disp: , Rfl:    orphenadrine (NORFLEX) 100 MG tablet, TAKE 1 TABLET(100 MG) BY MOUTH AT BEDTIME AS NEEDED FOR MUSCLE SPASMS, Disp: 30 tablet, Rfl: 2   pantoprazole (PROTONIX) 40 MG tablet, Take 40 mg by mouth daily before breakfast., Disp: , Rfl:    potassium chloride (KLOR-CON M) 10 MEQ tablet, Take 2 tablets (20 mEq total) by mouth daily., Disp: 60 tablet, Rfl: 1   pramoxine-hydrocortisone (PROCTOCREAM-HC) 1-1 % rectal cream, Place 1 Application rectally 2 (two) times daily. (Patient taking differently: Place 1 Application rectally 2 (two) times daily as needed for hemorrhoids or anal itching (if hydrocortisone 2.5% cream is not available).), Disp: 30 g, Rfl: 3   promethazine (PHENERGAN) 12.5 MG tablet, TAKE 1 TABLET(12.5 MG) BY MOUTH TWICE DAILY AS NEEDED (Patient taking differently: Take 12.5 mg by mouth See admin instructions. Take 12.5 mg by mouth at bedtime and an additional 12.5 mg once a day as needed for nausea or vomiting), Disp: 30 tablet, Rfl: 3   propranolol (INDERAL) 20 MG tablet, TAKE 1 TABLET(20 MG) BY MOUTH TWICE DAILY, Disp: 180 tablet, Rfl: 1   dextromethorphan 15 MG/5ML syrup, Take 10 mLs (30 mg total) by mouth 4 (four) times daily as needed for cough. (Patient not taking: Reported on 02/06/2023), Disp: 120 mL, Rfl: 0  Allergies:  Allergies  Allergen Reactions    Penicillins Shortness Of Breath and Other (See Comments)    Irregular and rapid Heart Rate, too    Alprazolam Hives and Other (See Comments)    Hard to arouse, unresponsiveness also   Ativan [Lorazepam] Swelling and Other (See Comments)    Face & Throat Swelling  Note: tolerates midazolam fine   Corticosteroids Other (See Comments)    "Psychotic behavior"    Erythromycin Other (See Comments)    Severe stomach pain    Gabapentin Other (See Comments)    Made the patient feel depressed   Savella  [Milnacipran] Other (See Comments)    Reaction not noted   Prednisolone Anxiety  Prednisone Anxiety and Other (See Comments)    "Anxiety & Nervous Breakdown"    Past Medical History, Surgical history, Social history, and Family History were reviewed and updated.  Review of Systems: Review of Systems  Constitutional:  Positive for fatigue and unexpected weight change.  HENT:  Negative.    Eyes: Negative.   Respiratory: Negative.    Cardiovascular: Negative.   Gastrointestinal:  Positive for abdominal pain and diarrhea.  Endocrine: Negative.   Genitourinary:  Positive for dysuria.   Musculoskeletal:  Positive for back pain.  Skin: Negative.   Neurological: Negative.   Hematological: Negative.   Psychiatric/Behavioral:  Positive for depression.     Physical Exam: Vital signs show a temperature of 98 3 pulse 73.  Blood pressure 133/67.  Weight is 221 pounds.    Wt Readings from Last 3 Encounters:  02/06/23 221 lb (100.2 kg)  12/07/22 241 lb (109.3 kg)  12/06/22 241 lb 3.2 oz (109.4 kg)    Physical Exam Vitals reviewed.  HENT:     Head: Normocephalic and atraumatic.  Eyes:     Pupils: Pupils are equal, round, and reactive to light.  Cardiovascular:     Rate and Rhythm: Normal rate and regular rhythm.     Heart sounds: Normal heart sounds.  Pulmonary:     Effort: Pulmonary effort is normal.     Breath sounds: Normal breath sounds.  Abdominal:     General: Bowel sounds  are normal.     Palpations: Abdomen is soft.     Comments: Abdominal exam shows healed laparotomy scar.  She has no fluid wave.  There is no guarding or rebound tenderness.  She has decent bowel sounds.  There is no palpable liver or spleen tip.  Musculoskeletal:        General: No tenderness or deformity. Normal range of motion.     Cervical back: Normal range of motion.  Lymphadenopathy:     Cervical: No cervical adenopathy.  Skin:    General: Skin is warm and dry.     Findings: No erythema or rash.  Neurological:     Mental Status: She is alert and oriented to person, place, and time.  Psychiatric:        Behavior: Behavior normal.        Thought Content: Thought content normal.        Judgment: Judgment normal.     Lab Results  Component Value Date   WBC 7.9 02/06/2023   HGB 12.4 02/06/2023   HCT 39.1 02/06/2023   MCV 94.9 02/06/2023   PLT 186 02/06/2023     Chemistry      Component Value Date/Time   NA 140 02/06/2023 1450   K 3.8 02/06/2023 1450   CL 103 02/06/2023 1450   CO2 30 02/06/2023 1450   BUN 18 02/06/2023 1450   CREATININE 1.11 (H) 02/06/2023 1450      Component Value Date/Time   CALCIUM 9.0 02/06/2023 1450   ALKPHOS 165 (H) 02/06/2023 1450   AST 23 02/06/2023 1450   ALT 32 02/06/2023 1450   BILITOT 0.4 02/06/2023 1450      Impression and Plan: Ms. Sussman is a very charming 62 year-old white female.  She has an incredible history.  She has had problems with recurrent appendiceal cancer.  She underwent a HIPEC procedure in [place]  a couple years ago.  She has had problems from that procedure.  She has had horrible diarrhea.  She has had C. difficile.  Despite C. difficile being treated, she still has diarrhea.  Again, I know that her Chromogranin A level has been elevated.  It will be interesting to see what this is.  I suppose that we can always can try to consider some Somatuline to see if this may help a little bit.  I may set her up with no  other set of CT angiograms of her chest.  We did send off other tumor markers.  Apparently, her surgeon up in [place] like to have these.  Maybe, a fecal transplant could be a possibility for this diarrhea.  I am unsure if she would qualify for this.  I know that Dr. Floreen Comber will certainly do all he can to try to help her quality of life.  Basically, her quality of life is dictated by her diarrhea.  We will plan to get her back after she has her trip up to [place] in early May.   [name], MD 4/15/20244:18 PM

## 2023-02-07 LAB — CA 125: Cancer Antigen (CA) 125: 7.1 U/mL (ref 0.0–38.1)

## 2023-02-07 LAB — CANCER ANTIGEN 19-9: CA 19-9: 19 U/mL (ref 0–35)

## 2023-02-08 LAB — CHROMOGRANIN A: Chromogranin A (ng/mL): 289.9 ng/mL — ABNORMAL HIGH (ref 0.0–101.8)

## 2023-02-13 ENCOUNTER — Encounter: Payer: Self-pay | Admitting: Hematology & Oncology

## 2023-02-13 DIAGNOSIS — K649 Unspecified hemorrhoids: Secondary | ICD-10-CM

## 2023-02-13 MED ORDER — HYDROCORTISONE (PERIANAL) 2.5 % EX CREA
1.0000 | TOPICAL_CREAM | Freq: Four times a day (QID) | CUTANEOUS | 1 refills | Status: DC | PRN
Start: 2023-02-13 — End: 2023-10-02

## 2023-02-14 ENCOUNTER — Other Ambulatory Visit: Payer: Self-pay

## 2023-02-14 DIAGNOSIS — C181 Malignant neoplasm of appendix: Secondary | ICD-10-CM

## 2023-02-21 ENCOUNTER — Inpatient Hospital Stay: Payer: Medicare Other

## 2023-02-21 ENCOUNTER — Ambulatory Visit (HOSPITAL_BASED_OUTPATIENT_CLINIC_OR_DEPARTMENT_OTHER)
Admission: RE | Admit: 2023-02-21 | Discharge: 2023-02-21 | Disposition: A | Discharge status: Home / Self Care | Payer: Medicare Other | Source: Ambulatory Visit | Attending: Hematology & Oncology | Admitting: Hematology & Oncology

## 2023-02-21 DIAGNOSIS — C181 Malignant neoplasm of appendix: Secondary | ICD-10-CM | POA: Insufficient documentation

## 2023-02-21 DIAGNOSIS — C772 Secondary and unspecified malignant neoplasm of intra-abdominal lymph nodes: Secondary | ICD-10-CM | POA: Insufficient documentation

## 2023-02-21 MED ORDER — HEPARIN SOD (PORK) LOCK FLUSH 100 UNIT/ML IV SOLN
500.0000 [IU] | Freq: Once | INTRAVENOUS | Status: AC
  Administered 2023-02-21: 500 [IU] via INTRAVENOUS

## 2023-02-21 MED ORDER — IOHEXOL 300 MG/ML  SOLN
100.0000 mL | Freq: Once | INTRAMUSCULAR | Status: AC | PRN
  Administered 2023-02-21: 100 mL via INTRAVENOUS

## 2023-02-21 MED ORDER — SODIUM CHLORIDE 0.9% FLUSH
10.0000 mL | Freq: Once | INTRAVENOUS | Status: AC
  Administered 2023-02-21: 10 mL via INTRAVENOUS

## 2023-02-21 NOTE — Patient Instructions (Signed)

## 2023-02-22 ENCOUNTER — Encounter: Payer: Self-pay | Admitting: Hematology & Oncology

## 2023-02-22 ENCOUNTER — Telehealth: Payer: Self-pay | Admitting: Family Medicine

## 2023-02-22 NOTE — Telephone Encounter (Signed)
Contacted Wendy Barber to schedule their annual wellness visit. Appointment made for 03/09/2023.  Thank you,  [facility] Support [facility] Direct dial  [phone]

## 2023-02-24 ENCOUNTER — Other Ambulatory Visit (HOSPITAL_COMMUNITY): Payer: Self-pay

## 2023-02-24 ENCOUNTER — Telehealth: Payer: Self-pay

## 2023-02-24 NOTE — Telephone Encounter (Signed)
-----   Message from [name], MD sent at 02/24/2023  3:20 PM EDT ----- Call and let her know that the CT scan is still does not show any obvious malignancy.  She has a lot of surgical changes.  She does have some coronary artery calcifications.  Thanks.  Cindee Lame

## 2023-02-27 ENCOUNTER — Other Ambulatory Visit (HOSPITAL_COMMUNITY): Payer: Self-pay

## 2023-02-28 ENCOUNTER — Other Ambulatory Visit: Payer: Self-pay

## 2023-02-28 ENCOUNTER — Other Ambulatory Visit (HOSPITAL_COMMUNITY): Payer: Self-pay

## 2023-03-06 ENCOUNTER — Other Ambulatory Visit (HOSPITAL_COMMUNITY): Payer: Self-pay

## 2023-03-07 ENCOUNTER — Telehealth (INDEPENDENT_AMBULATORY_CARE_PROVIDER_SITE_OTHER): Payer: Medicare Other | Admitting: Behavioral Health

## 2023-03-07 ENCOUNTER — Encounter: Payer: Self-pay | Admitting: Hematology & Oncology

## 2023-03-07 ENCOUNTER — Encounter: Payer: Self-pay | Admitting: Behavioral Health

## 2023-03-07 DIAGNOSIS — F411 Generalized anxiety disorder: Secondary | ICD-10-CM | POA: Diagnosis not present

## 2023-03-07 DIAGNOSIS — F331 Major depressive disorder, recurrent, moderate: Secondary | ICD-10-CM

## 2023-03-07 MED ORDER — AUVELITY 45-105 MG PO TBCR: EXTENDED_RELEASE_TABLET | ORAL | 1 refills | Status: DC

## 2023-03-07 NOTE — Progress Notes (Signed)
Wendy Barber [id number] 02/01/1961 62 y.o.  Virtual Visit via Video Note  I connected with pt @ on 03/07/23 at  1:30 PM EDT by a video enabled telemedicine application and verified that I am speaking with the correct person using two identifiers.   I discussed the limitations of evaluation and management by telemedicine and the availability of in person appointments. The patient expressed understanding and agreed to proceed.  I discussed the assessment and treatment plan with the patient. The patient was provided an opportunity to ask questions and all were answered. The patient agreed with the plan and demonstrated an understanding of the instructions.   The patient was advised to call back or seek an in-person evaluation if the symptoms worsen or if the condition fails to improve as anticipated.  I provided 30  minutes of non-face-to-face time during this encounter.  The patient was located at home.  The provider was located at [facility].   [name], NP   Subjective:   Patient ID:  Wendy Barber is a 62 y.o. (DOB 1961/06/12) female.  Chief Complaint:  Chief Complaint  Patient presents with   Anxiety   Depression   Follow-up   Medication Refill   Patient Education   Medication Problem    HPI 62 year old female presents to this office  for follow up and medication management.   Still facing a multitude of health issues.  She understands that depression related to chronic disease is very difficult to treat. She is requesting a change or adjustment to her medications to see if something else may help improve quality of life given her situation.  Reports her anxiety today at 3/10 and depression at 5/10. She is sleeping 6 hours per night. Her family remains highly supportive. She denies any mania, no psychosis, No current SI or HI.       No prior psychiatric medications   Review of Systems:  Review of Systems  Constitutional: Negative.   Neurological:  Negative.   Psychiatric/Behavioral:  Positive for dysphoric mood. The patient is nervous/anxious.     Medications: I have reviewed the patient's current medications.  Current Outpatient Medications  Medication Sig Dispense Refill   amLODipine (NORVASC) 5 MG tablet TAKE 1 TABLET(5 MG) BY MOUTH DAILY (Patient taking differently: Take 5 mg by mouth daily.) 90 tablet 1   buPROPion (WELLBUTRIN XL) 300 MG 24 hr tablet TAKE 1 TABLET(300 MG) BY MOUTH DAILY (Patient taking differently: Take 300 mg by mouth in the morning.) 90 tablet 0   busPIRone (BUSPAR) 15 MG tablet Take 1 tablet (15 mg total) by mouth 3 (three) times daily. 270 tablet 3   citalopram (CELEXA) 40 MG tablet Take 1 tablet (40 mg total) by mouth daily. 90 tablet 1   cyanocobalamin 1000 MCG tablet Take 1 tablet (1,000 mcg total) by mouth daily.     dicyclomine (BENTYL) 20 MG tablet TAKE 1 TABLET(20 MG) BY MOUTH THREE TIMES DAILY BEFORE MEALS (Patient taking differently: Take 20 mg by mouth 3 (three) times daily before meals.) 90 tablet 4   diphenoxylate-atropine (LOMOTIL) 2.5-0.025 MG tablet TAKE 2 TABLETS IN THE MORNING, AT NOON, IN THE EVENING AND AT BEDTIME Strength: 2.5-0.025 mg 240 tablet 3   eszopiclone (LUNESTA) 2 MG TABS tablet TAKE 1 TABLET(2 MG) BY MOUTH AT BEDTIME AS NEEDED FOR SLEEP (Patient taking differently: Take 2 mg by mouth at bedtime.) 30 tablet 3   famotidine (PEPCID) 20 MG tablet TAKE 2 TABLETS TWICE A DAY 360  tablet 3   ferrous sulfate 325 (65 FE) MG tablet Take 1 tablet (325 mg total) by mouth daily. 30 tablet 3   fondaparinux (ARIXTRA) 10 MG/0.8ML SOLN injection Inject 0.8 mLs (10 mg total) into the skin daily. 80 mL 0   hydrocortisone (ANUSOL-HC) 2.5 % rectal cream Place 1 Application rectally 4 (four) times daily as needed for hemorrhoids or anal itching. 30 g 1   HYDROmorphone (DILAUDID) 4 MG tablet Take 1 tablet (4 mg total) by mouth every 6 (six) hours as needed for severe pain. 30 tablet 0   loperamide  (IMODIUM) 2 MG capsule Take 2 capsules (4 mg total) by mouth 4 (four) times daily as needed for diarrhea or loose stools. (Patient taking differently: Take 4 mg by mouth in the morning, at noon, in the evening, and at bedtime.)     meclizine (ANTIVERT) 25 MG tablet TAKE 1 TABLET(25 MG) BY MOUTH THREE TIMES DAILY AS NEEDED FOR DIZZINESS 60 tablet 0   nystatin-triamcinolone ointment (MYCOLOG) Apply 1 Application topically 2 (two) times daily. For up to 7 days and then STOP.  Repeat as needed for flares after at least a 2 week break 30 g 0   orphenadrine (NORFLEX) 100 MG tablet TAKE 1 TABLET(100 MG) BY MOUTH AT BEDTIME AS NEEDED FOR MUSCLE SPASMS 30 tablet 2   pantoprazole (PROTONIX) 40 MG tablet Take 40 mg by mouth daily before breakfast.     potassium chloride (KLOR-CON M) 10 MEQ tablet Take 2 tablets (20 mEq total) by mouth daily. 60 tablet 1   pramoxine-hydrocortisone (PROCTOCREAM-HC) 1-1 % rectal cream Place 1 Application rectally 2 (two) times daily. (Patient taking differently: Place 1 Application rectally 2 (two) times daily as needed for hemorrhoids or anal itching (if hydrocortisone 2.5% cream is not available).) 30 g 3   promethazine (PHENERGAN) 12.5 MG tablet TAKE 1 TABLET(12.5 MG) BY MOUTH TWICE DAILY AS NEEDED (Patient taking differently: Take 12.5 mg by mouth See admin instructions. Take 12.5 mg by mouth at bedtime and an additional 12.5 mg once a day as needed for nausea or vomiting) 30 tablet 3   propranolol (INDERAL) 20 MG tablet TAKE 1 TABLET(20 MG) BY MOUTH TWICE DAILY 180 tablet 1   ondansetron (ZOFRAN) 8 MG tablet Take 8 mg by mouth See admin instructions. Am yes and 2 day if neneced     No current facility-administered medications for this visit.    Medication Side Effects: None  Allergies:  Allergies  Allergen Reactions   Penicillins Shortness Of Breath and Other (See Comments)    Irregular and rapid Heart Rate, too    Alprazolam Hives and Other (See Comments)    Hard to  arouse, unresponsiveness also   Ativan [Lorazepam] Swelling and Other (See Comments)    Face & Throat Swelling  Note: tolerates midazolam fine   Corticosteroids Other (See Comments)    "Psychotic behavior"    Erythromycin Other (See Comments)    Severe stomach pain    Gabapentin Other (See Comments)    Made the patient feel depressed   Savella  [Milnacipran] Other (See Comments)    Reaction not noted   Prednisolone Anxiety   Prednisone Anxiety and Other (See Comments)    "Anxiety & Nervous Breakdown"    Past Medical History:  Diagnosis Date   Arthritis    Back pain    Cancer (HCC)    pseudomyxoma peritonei   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   Chronic fatigue  syndrome    Diabetes mellitus without complication (HCC)    DVT of deep femoral vein, left (HCC) 03/19/2020   Fibromyalgia    Goals of care, counseling/discussion 03/19/2020   Hypertension    Iron deficiency anemia due to chronic blood loss 05/14/2021   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   Pernicious anemia 05/14/2021   Presence of IVC filter 03/19/2020   Pulmonary embolism, bilateral (HCC) 03/19/2020   Short bowel syndrome, unspecified     Family History  Problem Relation Age of Onset   Depression Mother    Drug abuse Sister    Suicidality Sister    Alcohol abuse Brother    Drug abuse Brother     Social History   Socioeconomic History   Marital status: Married    Spouse name: Not on file   Number of children: 3   Years of education: Not on file   Highest education level: Some college, no degree  Occupational History   Occupation: disability  Tobacco Use   Smoking status: Never   Smokeless tobacco: Never  Vaping Use   Vaping Use: Never used  Substance and Sexual Activity   Alcohol use: No   Drug use: Never   Sexual activity: Yes    Birth control/protection: None    Comment: hysterectomy  Other Topics Concern   Not on file  Social  History Narrative   Right handed   One story home   Drinks occasional caffeine   Social Determinants of Health   Financial Resource Strain: Not on file  Food Insecurity: No Food Insecurity (12/07/2022)   Hunger Vital Sign    Worried About Running Out of Food in the Last Year: Never true    Ran Out of Food in the Last Year: Never true  Transportation Needs: No Transportation Needs (12/07/2022)   PRAPARE - Administrator, Civil Service (Medical): No    Lack of Transportation (Non-Medical): No  Physical Activity: Not on file  Stress: Not on file  Social Connections: Not on file  Intimate Partner Violence: Not At Risk (12/07/2022)   Humiliation, Afraid, Rape, and Kick questionnaire    Fear of Current or Ex-Partner: No    Emotionally Abused: No    Physically Abused: No    Sexually Abused: No    Past Medical History, Surgical history, Social history, and Family history were reviewed and updated as appropriate.   Please see review of systems for further details on the patient's review from today.   Objective:   Physical Exam:  There were no vitals taken for this visit.  Physical Exam Neurological:     Mental Status: She is alert and oriented to person, place, and time.  Psychiatric:        Attention and Perception: Attention and perception normal.        Mood and Affect: Mood and affect normal.        Speech: Speech normal.        Behavior: Behavior normal. Behavior is cooperative.        Cognition and Memory: Cognition and memory normal.        Judgment: Judgment normal.     Comments: Insight intact     Lab Review:     Component Value Date/Time   NA 140 02/06/2023 1450   K 3.8 02/06/2023 1450   CL 103 02/06/2023 1450   CO2 30 02/06/2023 1450   GLUCOSE 147 (H) 02/06/2023 1450   BUN 18 02/06/2023 1450  CREATININE 1.11 (H) 02/06/2023 1450   CALCIUM 9.0 02/06/2023 1450   PROT 7.2 02/06/2023 1450   ALBUMIN 3.8 02/06/2023 1450   AST 23 02/06/2023 1450    ALT 32 02/06/2023 1450   ALKPHOS 165 (H) 02/06/2023 1450   BILITOT 0.4 02/06/2023 1450   GFRNONAA 57 (L) 02/06/2023 1450   GFRAA >60 07/27/2020 0922       Component Value Date/Time   WBC 7.9 02/06/2023 1450   WBC 9.3 12/09/2022 0200   RBC 4.12 02/06/2023 1450   HGB 12.4 02/06/2023 1450   HCT 39.1 02/06/2023 1450   PLT 186 02/06/2023 1450   MCV 94.9 02/06/2023 1450   MCH 30.1 02/06/2023 1450   MCHC 31.7 02/06/2023 1450   RDW 13.8 02/06/2023 1450   LYMPHSABS 3.5 02/06/2023 1450   MONOABS 0.7 02/06/2023 1450   EOSABS 0.2 02/06/2023 1450   BASOSABS 0.1 02/06/2023 1450    No results found for: "POCLITH", "LITHIUM"   No results found for: "PHENYTOIN", "PHENOBARB", "VALPROATE", "CBMZ"   .res Assessment: Plan:    Greater than 50% of 30 min visit with patient was spent on counseling and coordination of care.  We discussed her current level of stability with anxiety and depression. We talked about her Chronic and serious health issues complicating tx options for depression.  We discussed medication and did decided to adjust to if it would help. Discussed medication options available.  We agreed to: Reduce Wellbutrin to 150 mg XR daily for one week, then 75 mg for one week and then stop. To start Auvelity, 45-90 mg daily for 3 days, then 90-210 mg daily.   Continue Buspar  to 15 mg to  three times daily as needed. Continue Celexa to 40 mg daily. Will take with food to help with nausea. Could make diarrhea worse in the beginning but may pass after a week or two.  To Continue Lunesta 5 mg at betime. Belsomra was not approved by insurance. She is happy with Alfonso Patten but says causes metallic taste in mouth next day.  Will report worsening symptoms or side effects promptly To follow up in 4 weeks to reassess Will continue to follow up with PCP regularly Provided emergency contact information Reviewed PDMP     Arlys John A. Sameul Tagle, NP   Wendy Barber was seen today for anxiety, depression,  follow-up, medication refill, patient education and medication problem.  Diagnoses and all orders for this visit:  Major depressive disorder, recurrent episode, moderate (HCC)  Generalized anxiety disorder     Please see After Visit Summary for patient specific instructions.  Future Appointments  Date Time Provider Department Center  03/09/2023 11:30 AM [facility] ANNUAL WELLNESS VISIT [facility]  04/17/2023 10:50 AM [name], [name], MD [facility] None    No orders of the defined types were placed in this encounter.     -------------------------------

## 2023-03-08 ENCOUNTER — Telehealth: Payer: Self-pay | Admitting: Behavioral Health

## 2023-03-08 ENCOUNTER — Ambulatory Visit (HOSPITAL_BASED_OUTPATIENT_CLINIC_OR_DEPARTMENT_OTHER)
Admission: RE | Admit: 2023-03-08 | Discharge: 2023-03-08 | Disposition: A | Discharge status: Home / Self Care | Payer: Medicare Other | Source: Ambulatory Visit | Attending: Hematology & Oncology | Admitting: Hematology & Oncology

## 2023-03-08 DIAGNOSIS — C181 Malignant neoplasm of appendix: Secondary | ICD-10-CM

## 2023-03-08 DIAGNOSIS — D510 Vitamin B12 deficiency anemia due to intrinsic factor deficiency: Secondary | ICD-10-CM

## 2023-03-08 DIAGNOSIS — C257 Malignant neoplasm of other parts of pancreas: Secondary | ICD-10-CM

## 2023-03-08 DIAGNOSIS — C574 Malignant neoplasm of uterine adnexa, unspecified: Secondary | ICD-10-CM | POA: Diagnosis present

## 2023-03-08 DIAGNOSIS — I2699 Other pulmonary embolism without acute cor pulmonale: Secondary | ICD-10-CM | POA: Diagnosis present

## 2023-03-08 DIAGNOSIS — C772 Secondary and unspecified malignant neoplasm of intra-abdominal lymph nodes: Secondary | ICD-10-CM | POA: Insufficient documentation

## 2023-03-08 DIAGNOSIS — E032 Hypothyroidism due to medicaments and other exogenous substances: Secondary | ICD-10-CM

## 2023-03-08 MED ORDER — IOHEXOL 350 MG/ML SOLN
75.0000 mL | Freq: Once | INTRAVENOUS | Status: AC | PRN
  Administered 2023-03-08: 75 mL via INTRAVENOUS

## 2023-03-08 NOTE — Telephone Encounter (Signed)
[facility] sent PA request  for Auvelity 45-105mg  , see CMM

## 2023-03-09 ENCOUNTER — Ambulatory Visit (INDEPENDENT_AMBULATORY_CARE_PROVIDER_SITE_OTHER): Payer: Medicare Other | Admitting: *Deleted

## 2023-03-09 DIAGNOSIS — Z0000 Encounter for general adult medical examination without abnormal findings: Secondary | ICD-10-CM

## 2023-03-09 NOTE — Progress Notes (Signed)
Subjective:   Wendy Barber is a 62 y.o. female who presents for an Initial Medicare Annual Wellness Visit.  I connected with  Wendy Barber on 03/09/23 by a telephone enabled telemedicine application and verified that I am speaking with the correct person using two identifiers.   I discussed the limitations of evaluation and management by telemedicine. The patient expressed understanding and agreed to proceed.  Patient location: home  Provider location: telephone/home    Review of Systems     Cardiac Risk Factors include: advanced age (>21men, >51 women);family history of premature cardiovascular disease;sedentary lifestyle;obesity (BMI >30kg/m2)     Objective:    Today's Vitals   03/09/23 1136  PainSc: 5    There is no height or weight on file to calculate BMI.     03/09/2023   11:50 AM 02/06/2023    3:53 PM 12/19/2022    3:23 PM 12/07/2022   11:58 PM 12/07/2022    3:33 PM 10/12/2022   10:05 AM 07/04/2022    2:56 PM  Advanced Directives  Does Patient Have a Medical Advance Directive? Yes Yes Yes  No Yes Yes  Type of Estate agent of [employer] Power of [place];Living will Healthcare Power of [place];Living will   Living will;Healthcare Power of Attorney Living will;Healthcare Power of Attorney  Copy of Healthcare Power of Attorney in Chart? Yes - validated most recent copy scanned in chart (See row information) No - copy requested    No - copy requested No - copy requested  Would patient like information on creating a medical advance directive?  No - Patient declined  No - Patient declined  No - Patient declined No - Patient declined    Current Medications (verified) Outpatient Encounter Medications as of 03/09/2023  Medication Sig   amLODipine (NORVASC) 5 MG tablet TAKE 1 TABLET(5 MG) BY MOUTH DAILY (Patient taking differently: Take 5 mg by mouth daily.)   buPROPion (WELLBUTRIN XL) 300 MG 24 hr tablet TAKE 1 TABLET(300 MG) BY MOUTH DAILY  (Patient taking differently: Take 300 mg by mouth in the morning.)   busPIRone (BUSPAR) 15 MG tablet Take 1 tablet (15 mg total) by mouth 3 (three) times daily.   citalopram (CELEXA) 40 MG tablet Take 1 tablet (40 mg total) by mouth daily.   cyanocobalamin 1000 MCG tablet Take 1 tablet (1,000 mcg total) by mouth daily.   Dextromethorphan-buPROPion ER (AUVELITY) 45-105 MG TBCR Take one tablet by mouth for 3 days, then take two tablet daily 7-8 hours between doses   dicyclomine (BENTYL) 20 MG tablet TAKE 1 TABLET(20 MG) BY MOUTH THREE TIMES DAILY BEFORE MEALS (Patient taking differently: Take 20 mg by mouth 3 (three) times daily before meals.)   diphenoxylate-atropine (LOMOTIL) 2.5-0.025 MG tablet TAKE 2 TABLETS IN THE MORNING, AT NOON, IN THE EVENING AND AT BEDTIME Strength: 2.5-0.025 mg   eszopiclone (LUNESTA) 2 MG TABS tablet TAKE 1 TABLET(2 MG) BY MOUTH AT BEDTIME AS NEEDED FOR SLEEP (Patient taking differently: Take 2 mg by mouth at bedtime.)   famotidine (PEPCID) 20 MG tablet TAKE 2 TABLETS TWICE A DAY   ferrous sulfate 325 (65 FE) MG tablet Take 1 tablet (325 mg total) by mouth daily.   fondaparinux (ARIXTRA) 10 MG/0.8ML SOLN injection Inject 0.8 mLs (10 mg total) into the skin daily.   hydrocortisone (ANUSOL-HC) 2.5 % rectal cream Place 1 Application rectally 4 (four) times daily as needed for hemorrhoids or anal itching.   HYDROmorphone (DILAUDID) 4 MG tablet Take  1 tablet (4 mg total) by mouth every 6 (six) hours as needed for severe pain.   loperamide (IMODIUM) 2 MG capsule Take 2 capsules (4 mg total) by mouth 4 (four) times daily as needed for diarrhea or loose stools. (Patient taking differently: Take 4 mg by mouth in the morning, at noon, in the evening, and at bedtime.)   meclizine (ANTIVERT) 25 MG tablet TAKE 1 TABLET(25 MG) BY MOUTH THREE TIMES DAILY AS NEEDED FOR DIZZINESS   nystatin-triamcinolone ointment (MYCOLOG) Apply 1 Application topically 2 (two) times daily. For up to 7 days  and then STOP.  Repeat as needed for flares after at least a 2 week break   ondansetron (ZOFRAN) 8 MG tablet Take 8 mg by mouth See admin instructions. Am yes and 2 day if neneced   orphenadrine (NORFLEX) 100 MG tablet TAKE 1 TABLET(100 MG) BY MOUTH AT BEDTIME AS NEEDED FOR MUSCLE SPASMS   pantoprazole (PROTONIX) 40 MG tablet Take 40 mg by mouth daily before breakfast.   potassium chloride (KLOR-CON M) 10 MEQ tablet Take 2 tablets (20 mEq total) by mouth daily.   pramoxine-hydrocortisone (PROCTOCREAM-HC) 1-1 % rectal cream Place 1 Application rectally 2 (two) times daily. (Patient taking differently: Place 1 Application rectally 2 (two) times daily as needed for hemorrhoids or anal itching (if hydrocortisone 2.5% cream is not available).)   promethazine (PHENERGAN) 12.5 MG tablet TAKE 1 TABLET(12.5 MG) BY MOUTH TWICE DAILY AS NEEDED (Patient taking differently: Take 12.5 mg by mouth See admin instructions. Take 12.5 mg by mouth at bedtime and an additional 12.5 mg once a day as needed for nausea or vomiting)   propranolol (INDERAL) 20 MG tablet TAKE 1 TABLET(20 MG) BY MOUTH TWICE DAILY   No facility-administered encounter medications on file as of 03/09/2023.    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   Chronic fatigue syndrome    Diabetes mellitus without complication (HCC)    DVT of deep femoral vein, left (HCC) 03/19/2020   Fibromyalgia    Goals of care, counseling/discussion 03/19/2020   Hypertension    Iron deficiency anemia due to chronic blood loss 05/14/2021   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   Pernicious anemia 05/14/2021   Presence of IVC filter  03/19/2020   Pulmonary embolism, bilateral (HCC) 03/19/2020   Short bowel syndrome, unspecified    Past Surgical History:  Procedure Laterality Date   ABDOMINAL HYSTERECTOMY     ABDOMINAL SURGERY     ACHILLES TENDON REPAIR     APPENDECTOMY     ARTHROPLASTY     CARPAL TUNNEL RELEASE     CHOLECYSTECTOMY     IR CATHETER TUBE CHANGE  02/02/2021   IR CATHETER TUBE CHANGE  02/25/2021   IR IMAGING GUIDED PORT INSERTION  06/20/2022   IR RADIOLOGIST EVAL & MGMT  02/24/2021   IR RADIOLOGIST EVAL & MGMT  03/10/2021   IR RADIOLOGIST EVAL & MGMT  06/22/2022   IR THROMBECT VENO MECH MOD SED  06/20/2022   IR US GUIDE BX ASP/DRAIN  11/25/2019   IR US GUIDE VASC ACCESS LEFT  06/20/2022   IR US GUIDE VASC ACCESS RIGHT  06/20/2022   IR US GUIDE VASC ACCESS RIGHT  06/20/2022  IR VENO/EXT/BI  06/20/2022   IR VENOCAVAGRAM IVC  06/20/2022   JOINT REPLACEMENT     KNEE ARTHROSCOPY     ORIF ANKLE FRACTURE Right 08/27/2019   Procedure: OPEN REDUCTION INTERNAL FIXATION RIGHT ANKLE FRACTURE;  Surgeon: Sheral Apley, MD;  Location: [facility];  Service: Orthopedics;  Laterality: Right;   perineorrophy     TONGUE BIOPSY     Family History  Problem Relation Age of Onset   Depression Mother    Drug abuse Sister    Suicidality Sister    Alcohol abuse Brother    Drug abuse Brother    Social History   Socioeconomic History   Marital status: Married    Spouse name: Not on file   Number of children: 3   Years of education: Not on file   Highest education level: Some college, no degree  Occupational History   Occupation: disability  Tobacco Use   Smoking status: Never   Smokeless tobacco: Never  Vaping Use   Vaping Use: Never used  Substance and Sexual Activity   Alcohol use: No   Drug use: Never   Sexual activity: Yes    Birth control/protection: None    Comment: hysterectomy  Other Topics Concern   Not on file  Social History Narrative   Right handed   One story home   Drinks occasional caffeine    Social Determinants of Health   Financial Resource Strain: Low Risk  (03/09/2023)   Overall Financial Resource Strain (CARDIA)    Difficulty of Paying Living Expenses: Not hard at all  Food Insecurity: No Food Insecurity (03/09/2023)   Hunger Vital Sign    Worried About Running Out of Food in the Last Year: Never true    Ran Out of Food in the Last Year: Never true  Transportation Needs: No Transportation Needs (03/09/2023)   PRAPARE - Administrator, Civil Service (Medical): No    Lack of Transportation (Non-Medical): No  Physical Activity: Inactive (03/09/2023)   Exercise Vital Sign    Days of Exercise per Week: 0 days    Minutes of Exercise per Session: 0 min  Stress: Stress Concern Present (03/09/2023)   [employer] of Occupational Health - Occupational Stress Questionnaire    Feeling of Stress : Rather much  Social Connections: Moderately Integrated (03/09/2023)   Social Connection and Isolation Panel [NHANES]    Frequency of Communication with Friends and Family: Twice a week    Frequency of Social Gatherings with Friends and Family: Once a week    Attends Religious Services: More than 4 times per year    Active Member of [employer] or Organizations: No    Attends Engineer, structural: Never    Marital Status: Married    Tobacco Counseling Counseling given: Not Answered   Clinical Intake:  Pre-visit preparation completed: Yes  Pain : 0-10 Pain Score: 5  Pain Type: Chronic pain Pain Location: Back Pain Orientation: Left Pain Descriptors / Indicators: Burning, Constant, Aching Pain Onset: More than a month ago Pain Frequency: Constant     Diabetes: No  How often do you need to have someone help you when you read instructions, pamphlets, or other written materials from your doctor or pharmacy?: 1 - Never  Diabetic?  no  Interpreter Needed?: No  Information entered by :: Remi Haggard LPN   Activities of Daily Living    03/09/2023    11:41 AM 03/05/2023    6:08 PM  In your  present state of health, do you have any difficulty performing the following activities:  Hearing? 0 0  Vision? 1 1  Difficulty concentrating or making decisions? 1 1  Walking or climbing stairs? 1 1  Dressing or bathing? 1 1  Doing errands, shopping? 1 1  Preparing Food and eating ? Y Y  Using the Toilet? Y Y  In the past six months, have you accidently leaked urine? Y Y  Do you have problems with loss of bowel control? Y Y  Managing your Medications? N N  Managing your Finances? N N  Housekeeping or managing your Housekeeping? Malvin Johns    Patient Care Team: Shade Flood, MD as PCP - General (Family Medicine) Drema Dallas, DO as Consulting Physician (Neurology) Ladell Pier as Surgical Oncologist (Surgical Oncology) [name], MD as Consulting Physician (Oncology)  Indicate any recent Medical Services you may have received from other than [facility] providers in the past year (date may be approximate).     Assessment:   This is a routine wellness examination for Draya.  Hearing/Vision screen Hearing Screening - Comments:: No trouble hearing Vision Screening - Comments:: Not up to date hecker  Dietary issues and exercise activities discussed: Current Exercise Habits: The patient does not participate in regular exercise at present   Goals Addressed             This Visit's Progress    Patient Stated       Would like to get out of the house more       Depression Screen    03/09/2023   11:47 AM 12/06/2022    9:51 AM 08/04/2022    9:56 AM 07/22/2022    8:55 AM 07/07/2022    1:17 PM 05/12/2022   12:16 PM 04/21/2022    3:57 PM  PHQ 2/9 Scores  PHQ - 2 Score 6 6 5  6 6 6   PHQ- 9 Score 22 24 16  26 25 25   Exception Documentation    Other- indicate reason in comment box     Not completed    Pt has taken screening 2 weeks ago       Fall Risk    03/09/2023   11:40 AM 03/05/2023    6:08 PM 12/06/2022    9:51 AM 08/04/2022     9:59 AM 07/22/2022    8:55 AM  Fall Risk   Falls in the past year? 1 1 0 1 1  Number falls in past yr: 1 1 0 1 0  Injury with Fall? 0 1 0 0 0  Risk for fall due to : History of fall(s);Impaired balance/gait  No Fall Risks History of fall(s) History of fall(s)  Follow up Falls evaluation completed;Education provided;Falls prevention discussed  Falls evaluation completed Falls evaluation completed Falls evaluation completed    FALL RISK PREVENTION PERTAINING TO THE HOME:  Any stairs in or around the home? Yes  If so, are there any without handrails? No  Home free of loose throw rugs in walkways, pet beds, electrical cords, etc? Yes  Adequate lighting in your home to reduce risk of falls? Yes   ASSISTIVE DEVICES UTILIZED TO PREVENT FALLS:  Life alert? No  Use of a cane, walker or w/c? Yes  Grab bars in the bathroom? Yes  Shower chair or bench in shower? Yes  Elevated toilet seat or a handicapped toilet? Yes   TIMED UP AND GO:  Was the test performed? No .  Cognitive Function:      04/16/2020   10:00 AM  Montreal Cognitive Assessment   Visuospatial/ Executive (0/5) 5  Naming (0/3) 3  Attention: Read list of digits (0/2) 2  Attention: Read list of letters (0/1) 1  Attention: Serial 7 subtraction starting at 100 (0/3) 3  Language: Repeat phrase (0/2) 1  Language : Fluency (0/1) 1  Abstraction (0/2) 2  Delayed Recall (0/5) 3  Orientation (0/6) 6  Total 27  Adjusted Score (based on education) 27      03/09/2023   11:43 AM  6CIT Screen  What Year? 0 points  What month? 0 points  What time? 0 points  Count back from 20 0 points  Months in reverse 2 points  Repeat phrase 8 points  Total Score 10 points    Immunizations Immunization History  Administered Date(s) Administered   Influenza,inj,Quad PF,6+ Mos 08/17/2017, 12/09/2022   Influenza-Unspecified 11/27/2021   PFIZER(Purple Top)SARS-COV-2 Vaccination 01/04/2020, 01/25/2020, 07/18/2020   Pneumococcal  Polysaccharide-23 08/17/2017, 12/09/2022    TDAP status: Due, Education has been provided regarding the importance of this vaccine. Advised may receive this vaccine at local pharmacy or Health Dept. Aware to provide a copy of the vaccination record if obtained from local pharmacy or Health Dept. Verbalized acceptance and understanding.  Flu Vaccine status: Up to date    Covid-19 vaccine status: Information provided on how to obtain vaccines.   Qualifies for Shingles Vaccine? Yes   Zostavax completed No   Shingrix Completed?: No.    Education has been provided regarding the importance of this vaccine. Patient has been advised to call insurance company to determine out of pocket expense if they have not yet received this vaccine. Advised may also receive vaccine at local pharmacy or Health Dept. Verbalized acceptance and understanding.  Screening Tests Health Maintenance  Topic Date Due   Diabetic kidney evaluation - Urine ACR  Never done   FOOT EXAM  05/05/2022   OPHTHALMOLOGY EXAM  07/01/2022   COVID-19 Vaccine (4 - 2023-24 season) 03/25/2023 (Originally 06/24/2022)   MAMMOGRAM  05/13/2023 (Originally 05/14/2015)   COLONOSCOPY (Pts 45-50yrs Insurance coverage will need to be confirmed)  05/13/2023 (Originally 11/03/2007)   Zoster Vaccines- Shingrix (1 of 2) 06/09/2023 (Originally 11/02/1981)   Hepatitis C Screening  07/08/2023 (Originally 11/02/1980)   INFLUENZA VACCINE  05/25/2023   HEMOGLOBIN A1C  06/07/2023   Diabetic kidney evaluation - eGFR measurement  02/06/2024   Medicare Annual Wellness (AWV)  03/08/2024   HIV Screening  Completed   HPV VACCINES  Aged Out   DTaP/Tdap/Td  Discontinued   PAP SMEAR-Modifier  Discontinued    Health Maintenance  Health Maintenance Due  Topic Date Due   Diabetic kidney evaluation - Urine ACR  Never done   FOOT EXAM  05/05/2022   OPHTHALMOLOGY EXAM  07/01/2022    Colonoscopy Educaiton provided  Mammogram status: Completed  . Repeat every  year    Lung Cancer Screening: (Low Dose CT Chest recommended if Age 53-80 years, 30 pack-year currently smoking OR have quit w/in 15years.) does not qualify.   Lung Cancer Screening Referral:   Additional Screening:  Hepatitis C Screening: never done qualify;   Vision Screening: Recommended annual ophthalmology exams for early detection of glaucoma and other disorders of the eye. Is the patient up to date with their annual eye exam?  No  Who is the provider or what is the name of the office in which the patient attends annual eye exams? [facility] If  pt is not established with a provider, would they like to be referred to a provider to establish care? No .   Dental Screening: Recommended annual dental exams for proper oral hygiene  Community Resource Referral / Chronic Care Management: CRR required this visit?  No   CCM required this visit?  No      Plan:     I have personally reviewed and noted the following in the patient's chart:   Medical and social history Use of alcohol, tobacco or illicit drugs  Current medications and supplements including opioid prescriptions. Patient is not currently taking opioid prescriptions. Functional ability and status Nutritional status Physical activity Advanced directives List of other physicians Hospitalizations, surgeries, and ER visits in previous 12 months Vitals Screenings to include cognitive, depression, and falls Referrals and appointments  In addition, I have reviewed and discussed with patient certain preventive protocols, quality metrics, and best practice recommendations. A written personalized care plan for preventive services as well as general preventive health recommendations were provided to patient.     Remi Haggard, LPN   [id number]/61/0960   Nurse Notes:

## 2023-03-09 NOTE — Patient Instructions (Signed)
Wendy Barber , Thank you for taking time to come for your Medicare Wellness Visit. I appreciate your ongoing commitment to your health goals. Please review the following plan we discussed and let me know if I can assist you in the future.   Screening recommendations/referrals: Colonoscopy: Education provided Mammogram: up to date  Recommended yearly ophthalmology/optometry visit for glaucoma screening and checkup Recommended yearly dental visit for hygiene and checkup  Vaccinations: Influenza vaccine: up to date Pneumococcal vaccine: Education provided Tdap vaccine: Education provided Shingles vaccine: Education provided    Advanced directives: on file       Preventive Care 65 Years and Older, Female Preventive care refers to lifestyle choices and visits with your health care provider that can promote health and wellness. What does preventive care include? A yearly physical exam. This is also called an annual well check. Dental exams once or twice a year. Routine eye exams. Ask your health care provider how often you should have your eyes checked. Personal lifestyle choices, including: Daily care of your teeth and gums. Regular physical activity. Eating a healthy diet. Avoiding tobacco and drug use. Limiting alcohol use. Practicing safe sex. Taking low-dose aspirin every day. Taking vitamin and mineral supplements as recommended by your health care provider. What happens during an annual well check? The services and screenings done by your health care provider during your annual well check will depend on your age, overall health, lifestyle risk factors, and family history of disease. Counseling  Your health care provider may ask you questions about your: Alcohol use. Tobacco use. Drug use. Emotional well-being. Home and relationship well-being. Sexual activity. Eating habits. History of falls. Memory and ability to understand (cognition). Work and work  Astronomer. Reproductive health. Screening  You may have the following tests or measurements: Height, weight, and BMI. Blood pressure. Lipid and cholesterol levels. These may be checked every 5 years, or more frequently if you are over 30 years old. Skin check. Lung cancer screening. You may have this screening every year starting at age 60 if you have a 30-pack-year history of smoking and currently smoke or have quit within the past 15 years. Fecal occult blood test (FOBT) of the stool. You may have this test every year starting at age 57. Flexible sigmoidoscopy or colonoscopy. You may have a sigmoidoscopy every 5 years or a colonoscopy every 10 years starting at age 62. Hepatitis C blood test. Hepatitis B blood test. Sexually transmitted disease (STD) testing. Diabetes screening. This is done by checking your blood sugar (glucose) after you have not eaten for a while (fasting). You may have this done every 1-3 years. Bone density scan. This is done to screen for osteoporosis. You may have this done starting at age 32. Mammogram. This may be done every 1-2 years. Talk to your health care provider about how often you should have regular mammograms. Talk with your health care provider about your test results, treatment options, and if necessary, the need for more tests. Vaccines  Your health care provider may recommend certain vaccines, such as: Influenza vaccine. This is recommended every year. Tetanus, diphtheria, and acellular pertussis (Tdap, Td) vaccine. You may need a Td booster every 10 years. Zoster vaccine. You may need this after age 50. Pneumococcal 13-valent conjugate (PCV13) vaccine. One dose is recommended after age 60. Pneumococcal polysaccharide (PPSV23) vaccine. One dose is recommended after age 71. Talk to your health care provider about which screenings and vaccines you need and how often you need them. This  information is not intended to replace advice given to you by  your health care provider. Make sure you discuss any questions you have with your health care provider. Document Released: 11/06/2015 Document Revised: 06/29/2016 Document Reviewed: 08/11/2015 Elsevier Interactive Patient Education  2017 [employer] Prevention in the Home Falls can cause injuries. They can happen to people of all ages. There are many things you can do to make your home safe and to help prevent falls. What can I do on the outside of my home? Regularly fix the edges of walkways and driveways and fix any cracks. Remove anything that might make you trip as you walk through a door, such as a raised step or threshold. Trim any bushes or trees on the path to your home. Use bright outdoor lighting. Clear any walking paths of anything that might make someone trip, such as rocks or tools. Regularly check to see if handrails are loose or broken. Make sure that both sides of any steps have handrails. Any raised decks and porches should have guardrails on the edges. Have any leaves, snow, or ice cleared regularly. Use sand or salt on walking paths during winter. Clean up any spills in your garage right away. This includes oil or grease spills. What can I do in the bathroom? Use night lights. Install grab bars by the toilet and in the tub and shower. Do not use towel bars as grab bars. Use non-skid mats or decals in the tub or shower. If you need to sit down in the shower, use a plastic, non-slip stool. Keep the floor dry. Clean up any water that spills on the floor as soon as it happens. Remove soap buildup in the tub or shower regularly. Attach bath mats securely with double-sided non-slip rug tape. Do not have throw rugs and other things on the floor that can make you trip. What can I do in the bedroom? Use night lights. Make sure that you have a light by your bed that is easy to reach. Do not use any sheets or blankets that are too big for your bed. They should not hang  down onto the floor. Have a firm chair that has side arms. You can use this for support while you get dressed. Do not have throw rugs and other things on the floor that can make you trip. What can I do in the kitchen? Clean up any spills right away. Avoid walking on wet floors. Keep items that you use a lot in easy-to-reach places. If you need to reach something above you, use a strong step stool that has a grab bar. Keep electrical cords out of the way. Do not use floor polish or wax that makes floors slippery. If you must use wax, use non-skid floor wax. Do not have throw rugs and other things on the floor that can make you trip. What can I do with my stairs? Do not leave any items on the stairs. Make sure that there are handrails on both sides of the stairs and use them. Fix handrails that are broken or loose. Make sure that handrails are as long as the stairways. Check any carpeting to make sure that it is firmly attached to the stairs. Fix any carpet that is loose or worn. Avoid having throw rugs at the top or bottom of the stairs. If you do have throw rugs, attach them to the floor with carpet tape. Make sure that you have a light switch at the top  of the stairs and the bottom of the stairs. If you do not have them, ask someone to add them for you. What else can I do to help prevent falls? Wear shoes that: Do not have high heels. Have rubber bottoms. Are comfortable and fit you well. Are closed at the toe. Do not wear sandals. If you use a stepladder: Make sure that it is fully opened. Do not climb a closed stepladder. Make sure that both sides of the stepladder are locked into place. Ask someone to hold it for you, if possible. Clearly mark and make sure that you can see: Any grab bars or handrails. First and last steps. Where the edge of each step is. Use tools that help you move around (mobility aids) if they are needed. These  include: Canes. Walkers. Scooters. Crutches. Turn on the lights when you go into a dark area. Replace any light bulbs as soon as they burn out. Set up your furniture so you have a clear path. Avoid moving your furniture around. If any of your floors are uneven, fix them. If there are any pets around you, be aware of where they are. Review your medicines with your doctor. Some medicines can make you feel dizzy. This can increase your chance of falling. Ask your doctor what other things that you can do to help prevent falls. This information is not intended to replace advice given to you by your health care provider. Make sure you discuss any questions you have with your health care provider. Document Released: 08/06/2009 Document Revised: 03/17/2016 Document Reviewed: 11/14/2014 Elsevier Interactive Patient Education  2017 [employer].

## 2023-03-15 ENCOUNTER — Encounter: Payer: Self-pay | Admitting: Hematology & Oncology

## 2023-03-15 ENCOUNTER — Inpatient Hospital Stay: Payer: Medicare Other

## 2023-03-15 ENCOUNTER — Inpatient Hospital Stay (HOSPITAL_BASED_OUTPATIENT_CLINIC_OR_DEPARTMENT_OTHER): Payer: Medicare Other | Admitting: Hematology & Oncology

## 2023-03-15 ENCOUNTER — Other Ambulatory Visit: Payer: Self-pay

## 2023-03-15 ENCOUNTER — Inpatient Hospital Stay: Payer: Medicare Other | Attending: Hematology & Oncology

## 2023-03-15 VITALS — BP 128/82 | HR 70 | Temp 98.6°F | Resp 17 | Ht 66.0 in | Wt 251.0 lb

## 2023-03-15 DIAGNOSIS — I2699 Other pulmonary embolism without acute cor pulmonale: Secondary | ICD-10-CM | POA: Diagnosis not present

## 2023-03-15 DIAGNOSIS — R109 Unspecified abdominal pain: Secondary | ICD-10-CM | POA: Diagnosis not present

## 2023-03-15 DIAGNOSIS — E032 Hypothyroidism due to medicaments and other exogenous substances: Secondary | ICD-10-CM

## 2023-03-15 DIAGNOSIS — Z79899 Other long term (current) drug therapy: Secondary | ICD-10-CM | POA: Diagnosis not present

## 2023-03-15 DIAGNOSIS — C772 Secondary and unspecified malignant neoplasm of intra-abdominal lymph nodes: Secondary | ICD-10-CM | POA: Diagnosis not present

## 2023-03-15 DIAGNOSIS — I82412 Acute embolism and thrombosis of left femoral vein: Secondary | ICD-10-CM | POA: Diagnosis not present

## 2023-03-15 DIAGNOSIS — C178 Malignant neoplasm of overlapping sites of small intestine: Secondary | ICD-10-CM

## 2023-03-15 DIAGNOSIS — Z880 Allergy status to penicillin: Secondary | ICD-10-CM | POA: Insufficient documentation

## 2023-03-15 DIAGNOSIS — R300 Dysuria: Secondary | ICD-10-CM | POA: Insufficient documentation

## 2023-03-15 DIAGNOSIS — R978 Other abnormal tumor markers: Secondary | ICD-10-CM | POA: Diagnosis not present

## 2023-03-15 DIAGNOSIS — K769 Liver disease, unspecified: Secondary | ICD-10-CM | POA: Diagnosis not present

## 2023-03-15 DIAGNOSIS — C786 Secondary malignant neoplasm of retroperitoneum and peritoneum: Secondary | ICD-10-CM

## 2023-03-15 DIAGNOSIS — M549 Dorsalgia, unspecified: Secondary | ICD-10-CM | POA: Insufficient documentation

## 2023-03-15 DIAGNOSIS — F32A Depression, unspecified: Secondary | ICD-10-CM | POA: Insufficient documentation

## 2023-03-15 DIAGNOSIS — C181 Malignant neoplasm of appendix: Secondary | ICD-10-CM | POA: Diagnosis present

## 2023-03-15 DIAGNOSIS — Z85038 Personal history of other malignant neoplasm of large intestine: Secondary | ICD-10-CM

## 2023-03-15 DIAGNOSIS — C574 Malignant neoplasm of uterine adnexa, unspecified: Secondary | ICD-10-CM

## 2023-03-15 DIAGNOSIS — Z95828 Presence of other vascular implants and grafts: Secondary | ICD-10-CM

## 2023-03-15 DIAGNOSIS — D510 Vitamin B12 deficiency anemia due to intrinsic factor deficiency: Secondary | ICD-10-CM | POA: Diagnosis not present

## 2023-03-15 DIAGNOSIS — Z881 Allergy status to other antibiotic agents status: Secondary | ICD-10-CM | POA: Diagnosis not present

## 2023-03-15 DIAGNOSIS — I82501 Chronic embolism and thrombosis of unspecified deep veins of right lower extremity: Secondary | ICD-10-CM | POA: Insufficient documentation

## 2023-03-15 DIAGNOSIS — R5383 Other fatigue: Secondary | ICD-10-CM | POA: Insufficient documentation

## 2023-03-15 DIAGNOSIS — M79605 Pain in left leg: Secondary | ICD-10-CM | POA: Insufficient documentation

## 2023-03-15 DIAGNOSIS — K2900 Acute gastritis without bleeding: Secondary | ICD-10-CM

## 2023-03-15 DIAGNOSIS — R197 Diarrhea, unspecified: Secondary | ICD-10-CM | POA: Insufficient documentation

## 2023-03-15 DIAGNOSIS — Z888 Allergy status to other drugs, medicaments and biological substances status: Secondary | ICD-10-CM | POA: Diagnosis not present

## 2023-03-15 DIAGNOSIS — Z7901 Long term (current) use of anticoagulants: Secondary | ICD-10-CM | POA: Insufficient documentation

## 2023-03-15 DIAGNOSIS — Z87440 Personal history of urinary (tract) infections: Secondary | ICD-10-CM | POA: Insufficient documentation

## 2023-03-15 DIAGNOSIS — C257 Malignant neoplasm of other parts of pancreas: Secondary | ICD-10-CM

## 2023-03-15 DIAGNOSIS — K7682 Hepatic encephalopathy: Secondary | ICD-10-CM

## 2023-03-15 LAB — CMP (CANCER CENTER ONLY)
ALT: 26 U/L (ref 0–44)
AST: 23 U/L (ref 15–41)
Albumin: 4.1 g/dL (ref 3.5–5.0)
Alkaline Phosphatase: 142 U/L — ABNORMAL HIGH (ref 38–126)
Anion gap: 12 (ref 5–15)
BUN: 24 mg/dL — ABNORMAL HIGH (ref 6–20)
CO2: 26 mmol/L (ref 22–32)
Calcium: 9.8 mg/dL (ref 8.9–10.3)
Chloride: 101 mmol/L (ref 98–111)
Creatinine: 1.07 mg/dL — ABNORMAL HIGH (ref 0.44–1.00)
GFR, Estimated: 59 mL/min — ABNORMAL LOW (ref >60)
Glucose, Bld: 154 mg/dL — ABNORMAL HIGH (ref 70–99)
Potassium: 4.3 mmol/L (ref 3.5–5.1)
Sodium: 139 mmol/L (ref 135–145)
Total Bilirubin: 0.4 mg/dL (ref 0.3–1.2)
Total Protein: 7.3 g/dL (ref 6.5–8.1)

## 2023-03-15 LAB — CBC WITH DIFFERENTIAL (CANCER CENTER ONLY)
Abs Immature Granulocytes: 0.04 10*3/uL (ref 0.00–0.07)
Basophils Absolute: 0.1 10*3/uL (ref 0.0–0.1)
Basophils Relative: 1 %
Eosinophils Absolute: 0.2 10*3/uL (ref 0.0–0.5)
Eosinophils Relative: 3 %
HCT: 37.6 % (ref 36.0–46.0)
Hemoglobin: 12.1 g/dL (ref 12.0–15.0)
Immature Granulocytes: 1 %
Lymphocytes Relative: 44 %
Lymphs Abs: 3.6 10*3/uL (ref 0.7–4.0)
MCH: 30.8 pg (ref 26.0–34.0)
MCHC: 32.2 g/dL (ref 30.0–36.0)
MCV: 95.7 fL (ref 80.0–100.0)
Monocytes Absolute: 0.7 10*3/uL (ref 0.1–1.0)
Monocytes Relative: 9 %
Neutro Abs: 3.3 10*3/uL (ref 1.7–7.7)
Neutrophils Relative %: 42 %
Platelet Count: 203 10*3/uL (ref 150–400)
RBC: 3.93 MIL/uL (ref 3.87–5.11)
RDW: 13.7 % (ref 11.5–15.5)
WBC Count: 7.8 10*3/uL (ref 4.0–10.5)
nRBC: 0 % (ref 0.0–0.2)

## 2023-03-15 LAB — VITAMIN B12: Vitamin B-12: 546 pg/mL (ref 180–914)

## 2023-03-15 LAB — CEA (ACCESS): CEA (CHCC): 1.66 ng/mL (ref 0.00–5.00)

## 2023-03-15 LAB — TSH: TSH: 2.478 u[IU]/mL (ref 0.350–4.500)

## 2023-03-15 LAB — FERRITIN: Ferritin: 68 ng/mL (ref 11–307)

## 2023-03-15 LAB — LACTATE DEHYDROGENASE: LDH: 203 U/L — ABNORMAL HIGH (ref 98–192)

## 2023-03-15 MED ORDER — SODIUM CHLORIDE 0.9% FLUSH
10.0000 mL | Freq: Once | INTRAVENOUS | Status: AC
  Administered 2023-03-15: 10 mL via INTRAVENOUS

## 2023-03-15 MED ORDER — OPIUM 10 MG/ML (1%) PO TINC
12.0000 [drp] | ORAL | 0 refills | Status: DC | PRN
  Filled 2023-03-15: qty 118, 25d supply, fill #0

## 2023-03-15 MED ORDER — FONDAPARINUX SODIUM 10 MG/0.8ML ~~LOC~~ SOLN
10.0000 mg | SUBCUTANEOUS | 3 refills | Status: DC
  Filled 2023-03-23: qty 8, 10d supply, fill #0
  Filled 2023-03-23: qty 1.6, 2d supply, fill #0
  Filled 2023-03-24: qty 64, 80d supply, fill #0
  Filled 2023-06-15: qty 72, 90d supply, fill #1
  Filled 2023-09-15 – 2023-09-29 (×2): qty 72, 90d supply, fill #2
  Filled 2023-12-21: qty 72, 90d supply, fill #3

## 2023-03-15 MED ORDER — HEPARIN SOD (PORK) LOCK FLUSH 100 UNIT/ML IV SOLN
500.0000 [IU] | Freq: Once | INTRAVENOUS | Status: AC
  Administered 2023-03-15: 500 [IU] via INTRAVENOUS

## 2023-03-15 NOTE — Progress Notes (Signed)
Hematology and Oncology Follow Up Visit  Wendy Barber [id number] 1961/06/04 62 y.o. 03/15/2023   Principle Diagnosis:  History of metastatic appendiceal cancer  -- recurrent  Superior mesenteric vein thrombus Thromboembolism of the RIGHT leg Pernicious anemia Acute pulmonary embolism-segmental right pulmonary artery  Current Therapy:   HIPEC - Surgery done in [place] in 11/2020 Arixtra 10 mg subcu daily --start on 2/16 2024 Vitamin B12 5000 mcg PO daily      Interim History:  Wendy Barber is in for follow-up.  She went to see her surgeon up in [place].  He thought that she needed to have a nuclear medicine PET scan done.  We will have to set this up.  She talked him about the diarrhea.  He recommended that she take Creon.  We have given her Creon before this would not work.  She did not want to take Creon.  He also suggested tincture of opium.  I know that we have not tried this before.  This certainly could be a possibility.  I will send the same.  She is having some pain in the left leg.  She has had thrombus in this leg.  She is on Arixtra.  She has had no bleeding.  However, we probably need to get a another Doppler of that leg.  Her last Chromogranin A level was actually down to 290.  She is still having the diarrhea.  This is her main problem.  She thought about having a colostomy.,  Sure she talk to Dr. Floreen Comber about this.  We did do a CT angiogram of her chest.  This was done recently.  This showed a chronic thrombus in the right lower lung.  She has had no issues with increase in pain in the abdomen.  She has had no urinary issues.  She has had problems with UTIs in the past.  Overall, I would say that her performance status is probably ECOG 1.    Medications:  Current Outpatient Medications:    amLODipine (NORVASC) 5 MG tablet, TAKE 1 TABLET(5 MG) BY MOUTH DAILY (Patient taking differently: Take 5 mg by mouth daily.), Disp: 90 tablet, Rfl: 1   buPROPion  (WELLBUTRIN XL) 300 MG 24 hr tablet, TAKE 1 TABLET(300 MG) BY MOUTH DAILY (Patient taking differently: Take 300 mg by mouth in the morning.), Disp: 90 tablet, Rfl: 0   busPIRone (BUSPAR) 15 MG tablet, Take 1 tablet (15 mg total) by mouth 3 (three) times daily., Disp: 270 tablet, Rfl: 3   citalopram (CELEXA) 40 MG tablet, Take 1 tablet (40 mg total) by mouth daily., Disp: 90 tablet, Rfl: 1   cyanocobalamin 1000 MCG tablet, Take 1 tablet (1,000 mcg total) by mouth daily., Disp: , Rfl:    Dextromethorphan-buPROPion ER (AUVELITY) 45-105 MG TBCR, Take one tablet by mouth for 3 days, then take two tablet daily 7-8 hours between doses, Disp: 60 tablet, Rfl: 1   dicyclomine (BENTYL) 20 MG tablet, TAKE 1 TABLET(20 MG) BY MOUTH THREE TIMES DAILY BEFORE MEALS (Patient taking differently: Take 20 mg by mouth 3 (three) times daily before meals.), Disp: 90 tablet, Rfl: 4   diphenoxylate-atropine (LOMOTIL) 2.5-0.025 MG tablet, TAKE 2 TABLETS IN THE MORNING, AT NOON, IN THE EVENING AND AT BEDTIME Strength: 2.5-0.025 mg, Disp: 240 tablet, Rfl: 3   eszopiclone (LUNESTA) 2 MG TABS tablet, TAKE 1 TABLET(2 MG) BY MOUTH AT BEDTIME AS NEEDED FOR SLEEP (Patient taking differently: Take 2 mg by mouth at bedtime.), Disp: 30 tablet, Rfl: 3  famotidine (PEPCID) 20 MG tablet, TAKE 2 TABLETS TWICE A DAY, Disp: 360 tablet, Rfl: 3   ferrous sulfate 325 (65 FE) MG tablet, Take 1 tablet (325 mg total) by mouth daily., Disp: 30 tablet, Rfl: 3   fondaparinux (ARIXTRA) 10 MG/0.8ML SOLN injection, Inject 0.8 mLs (10 mg total) into the skin daily., Disp: 72 mL, Rfl: 3   hydrocortisone (ANUSOL-HC) 2.5 % rectal cream, Place 1 Application rectally 4 (four) times daily as needed for hemorrhoids or anal itching., Disp: 30 g, Rfl: 1   HYDROmorphone (DILAUDID) 4 MG tablet, Take 1 tablet (4 mg total) by mouth every 6 (six) hours as needed for severe pain., Disp: 30 tablet, Rfl: 0   loperamide (IMODIUM) 2 MG capsule, Take 2 capsules (4 mg total) by  mouth 4 (four) times daily as needed for diarrhea or loose stools. (Patient taking differently: Take 4 mg by mouth in the morning, at noon, in the evening, and at bedtime.), Disp: , Rfl:    meclizine (ANTIVERT) 25 MG tablet, TAKE 1 TABLET(25 MG) BY MOUTH THREE TIMES DAILY AS NEEDED FOR DIZZINESS, Disp: 60 tablet, Rfl: 0   nystatin-triamcinolone ointment (MYCOLOG), Apply 1 Application topically 2 (two) times daily. For up to 7 days and then STOP.  Repeat as needed for flares after at least a 2 week break, Disp: 30 g, Rfl: 0   ondansetron (ZOFRAN) 8 MG tablet, Take 8 mg by mouth See admin instructions. Am yes and 2 day if neneced, Disp: , Rfl:    Opium 10 MG/ML (1%) TINC, Take 0.6 mLs (6 mg total) by mouth every 3 (three) hours as needed for diarrhea or loose stools., Disp: 118 mL, Rfl: 0   orphenadrine (NORFLEX) 100 MG tablet, TAKE 1 TABLET(100 MG) BY MOUTH AT BEDTIME AS NEEDED FOR MUSCLE SPASMS, Disp: 30 tablet, Rfl: 2   pantoprazole (PROTONIX) 40 MG tablet, Take 40 mg by mouth daily before breakfast., Disp: , Rfl:    potassium chloride (KLOR-CON M) 10 MEQ tablet, Take 2 tablets (20 mEq total) by mouth daily., Disp: 60 tablet, Rfl: 1   pramoxine-hydrocortisone (PROCTOCREAM-HC) 1-1 % rectal cream, Place 1 Application rectally 2 (two) times daily. (Patient taking differently: Place 1 Application rectally 2 (two) times daily as needed for hemorrhoids or anal itching (if hydrocortisone 2.5% cream is not available).), Disp: 30 g, Rfl: 3   promethazine (PHENERGAN) 12.5 MG tablet, TAKE 1 TABLET(12.5 MG) BY MOUTH TWICE DAILY AS NEEDED (Patient taking differently: Take 12.5 mg by mouth See admin instructions. Take 12.5 mg by mouth at bedtime and an additional 12.5 mg once a day as needed for nausea or vomiting), Disp: 30 tablet, Rfl: 3   propranolol (INDERAL) 20 MG tablet, TAKE 1 TABLET(20 MG) BY MOUTH TWICE DAILY, Disp: 180 tablet, Rfl: 1  Allergies:  Allergies  Allergen Reactions   Penicillins Shortness Of  Breath and Other (See Comments)    Irregular and rapid Heart Rate, too    Alprazolam Hives and Other (See Comments)    Hard to arouse, unresponsiveness also   Ativan [Lorazepam] Swelling and Other (See Comments)    Face & Throat Swelling  Note: tolerates midazolam fine   Corticosteroids Other (See Comments)    "Psychotic behavior"    Erythromycin Other (See Comments)    Severe stomach pain    Gabapentin Other (See Comments)    Made the patient feel depressed   Savella  [Milnacipran] Other (See Comments)    Reaction not noted   Prednisolone Anxiety  Prednisone Anxiety and Other (See Comments)    "Anxiety & Nervous Breakdown"    Past Medical History, Surgical history, Social history, and Family History were reviewed and updated.  Review of Systems: Review of Systems  Constitutional:  Positive for fatigue and unexpected weight change.  HENT:  Negative.    Eyes: Negative.   Respiratory: Negative.    Cardiovascular: Negative.   Gastrointestinal:  Positive for abdominal pain and diarrhea.  Endocrine: Negative.   Genitourinary:  Positive for dysuria.   Musculoskeletal:  Positive for back pain.  Skin: Negative.   Neurological: Negative.   Hematological: Negative.   Psychiatric/Behavioral:  Positive for depression.     Physical Exam: Vital signs show a temperature of 98 3 pulse 73.  Blood pressure 133/67.  Weight is 221 pounds.    Wt Readings from Last 3 Encounters:  03/15/23 251 lb (113.9 kg)  02/06/23 221 lb (100.2 kg)  12/07/22 241 lb (109.3 kg)    Physical Exam Vitals reviewed.  HENT:     Head: Normocephalic and atraumatic.  Eyes:     Pupils: Pupils are equal, round, and reactive to light.  Cardiovascular:     Rate and Rhythm: Normal rate and regular rhythm.     Heart sounds: Normal heart sounds.  Pulmonary:     Effort: Pulmonary effort is normal.     Breath sounds: Normal breath sounds.  Abdominal:     General: Bowel sounds are normal.     Palpations:  Abdomen is soft.     Comments: Abdominal exam shows healed laparotomy scar.  She has no fluid wave.  There is no guarding or rebound tenderness.  She has decent bowel sounds.  There is no palpable liver or spleen tip.  Musculoskeletal:        General: No tenderness or deformity. Normal range of motion.     Cervical back: Normal range of motion.  Lymphadenopathy:     Cervical: No cervical adenopathy.  Skin:    General: Skin is warm and dry.     Findings: No erythema or rash.  Neurological:     Mental Status: She is alert and oriented to person, place, and time.  Psychiatric:        Behavior: Behavior normal.        Thought Content: Thought content normal.        Judgment: Judgment normal.      Lab Results  Component Value Date   WBC 7.8 03/15/2023   HGB 12.1 03/15/2023   HCT 37.6 03/15/2023   MCV 95.7 03/15/2023   PLT 203 03/15/2023     Chemistry      Component Value Date/Time   NA 139 03/15/2023 1544   K 4.3 03/15/2023 1544   CL 101 03/15/2023 1544   CO2 26 03/15/2023 1544   BUN 24 (H) 03/15/2023 1544   CREATININE 1.07 (H) 03/15/2023 1544      Component Value Date/Time   CALCIUM 9.8 03/15/2023 1544   ALKPHOS 142 (H) 03/15/2023 1544   AST 23 03/15/2023 1544   ALT 26 03/15/2023 1544   BILITOT 0.4 03/15/2023 1544      Impression and Plan: Ms. Newbrough is a very charming 62 year-old white female.  She has an incredible history.  She has had problems with recurrent appendiceal cancer.  She underwent a HIPEC procedure in [place]  a couple years ago.  She has had problems from that procedure.  She has had horrible diarrhea.  She has had C. difficile.  Despite C. difficile being treated, she still has diarrhea.  Again, I know that her Chromogranin A level has been elevated.  We will go ahead and see about getting the nuclear medicine PET scan on her.  We will also get the Doppler of her left leg.  She continues on the Arixtra.  Hopefully, the tincture of opium might  help with the diarrhea.  Will have to continue to follow along closely.  I probably will plan to get her back to see [place] in another 4 to 6 weeks.    [name], MD 5/22/20245:33 PM

## 2023-03-15 NOTE — Patient Instructions (Signed)

## 2023-03-16 ENCOUNTER — Other Ambulatory Visit: Payer: Self-pay

## 2023-03-16 ENCOUNTER — Other Ambulatory Visit (HOSPITAL_COMMUNITY): Payer: Self-pay

## 2023-03-16 LAB — IRON AND IRON BINDING CAPACITY (CC-WL,HP ONLY)
Iron: 114 ug/dL (ref 28–170)
Saturation Ratios: 29 % (ref 10.4–31.8)
TIBC: 388 ug/dL (ref 250–450)
UIBC: 274 ug/dL (ref 148–442)

## 2023-03-17 ENCOUNTER — Other Ambulatory Visit (HOSPITAL_COMMUNITY): Payer: Self-pay

## 2023-03-17 ENCOUNTER — Other Ambulatory Visit: Payer: Self-pay | Admitting: Family

## 2023-03-17 DIAGNOSIS — H8112 Benign paroxysmal vertigo, left ear: Secondary | ICD-10-CM

## 2023-03-17 LAB — CANCER ANTIGEN 19-9: CA 19-9: 19 U/mL (ref 0–35)

## 2023-03-17 LAB — CA 125: Cancer Antigen (CA) 125: 6.3 U/mL (ref 0.0–38.1)

## 2023-03-21 LAB — CHROMOGRANIN A: Chromogranin A (ng/mL): 410.5 ng/mL — ABNORMAL HIGH (ref 0.0–101.8)

## 2023-03-22 ENCOUNTER — Other Ambulatory Visit (HOSPITAL_BASED_OUTPATIENT_CLINIC_OR_DEPARTMENT_OTHER): Payer: Self-pay

## 2023-03-22 ENCOUNTER — Other Ambulatory Visit (HOSPITAL_COMMUNITY): Payer: Self-pay

## 2023-03-22 ENCOUNTER — Ambulatory Visit (HOSPITAL_BASED_OUTPATIENT_CLINIC_OR_DEPARTMENT_OTHER)
Admission: RE | Admit: 2023-03-22 | Discharge: 2023-03-22 | Disposition: A | Discharge status: Home / Self Care | Payer: Medicare Other | Source: Ambulatory Visit | Attending: Hematology & Oncology | Admitting: Hematology & Oncology

## 2023-03-22 ENCOUNTER — Other Ambulatory Visit: Payer: Self-pay | Admitting: Hematology & Oncology

## 2023-03-22 DIAGNOSIS — I82412 Acute embolism and thrombosis of left femoral vein: Secondary | ICD-10-CM | POA: Insufficient documentation

## 2023-03-23 ENCOUNTER — Other Ambulatory Visit (HOSPITAL_COMMUNITY): Payer: Self-pay

## 2023-03-23 ENCOUNTER — Encounter: Payer: Self-pay | Admitting: Hematology & Oncology

## 2023-03-23 ENCOUNTER — Encounter: Payer: Self-pay | Admitting: *Deleted

## 2023-03-23 ENCOUNTER — Other Ambulatory Visit: Payer: Self-pay

## 2023-03-24 ENCOUNTER — Telehealth: Payer: Self-pay

## 2023-03-24 ENCOUNTER — Other Ambulatory Visit: Payer: Self-pay

## 2023-03-24 ENCOUNTER — Other Ambulatory Visit (HOSPITAL_COMMUNITY): Payer: Self-pay

## 2023-03-24 ENCOUNTER — Other Ambulatory Visit: Payer: Self-pay | Admitting: Family Medicine

## 2023-03-24 DIAGNOSIS — R110 Nausea: Secondary | ICD-10-CM

## 2023-03-24 NOTE — Telephone Encounter (Signed)
Prior authorization initiated for AUVELITY 45-105 MG with [employer], approval received effective 02/22/2023-10/23/2098, PA# [id number]

## 2023-03-24 NOTE — Telephone Encounter (Signed)
Ordered

## 2023-03-24 NOTE — Telephone Encounter (Signed)
Is it ok to refill medication listed below ? Pt states she takes this daily since she had cancer tx for Appendix cancer

## 2023-03-24 NOTE — Telephone Encounter (Signed)
PA approved effective 02/22/2023-10/23/2098 with [employer]

## 2023-03-27 ENCOUNTER — Other Ambulatory Visit: Payer: Self-pay

## 2023-03-29 ENCOUNTER — Other Ambulatory Visit (HOSPITAL_COMMUNITY): Payer: Self-pay

## 2023-04-07 ENCOUNTER — Other Ambulatory Visit: Payer: Self-pay | Admitting: Hematology & Oncology

## 2023-04-07 ENCOUNTER — Other Ambulatory Visit: Payer: Self-pay | Admitting: Family

## 2023-04-07 ENCOUNTER — Other Ambulatory Visit: Payer: Self-pay | Admitting: Behavioral Health

## 2023-04-07 DIAGNOSIS — F5105 Insomnia due to other mental disorder: Secondary | ICD-10-CM

## 2023-04-07 DIAGNOSIS — H8112 Benign paroxysmal vertigo, left ear: Secondary | ICD-10-CM

## 2023-04-09 NOTE — Telephone Encounter (Signed)
Due 6/22

## 2023-04-11 ENCOUNTER — Encounter: Payer: Self-pay | Admitting: Behavioral Health

## 2023-04-11 ENCOUNTER — Ambulatory Visit (HOSPITAL_COMMUNITY)
Admission: RE | Admit: 2023-04-11 | Discharge: 2023-04-11 | Disposition: A | Discharge status: Home / Self Care | Payer: Medicare Other | Source: Ambulatory Visit | Attending: Hematology & Oncology | Admitting: Hematology & Oncology

## 2023-04-11 ENCOUNTER — Telehealth (INDEPENDENT_AMBULATORY_CARE_PROVIDER_SITE_OTHER): Payer: Medicare Other | Admitting: Behavioral Health

## 2023-04-11 DIAGNOSIS — F99 Mental disorder, not otherwise specified: Secondary | ICD-10-CM

## 2023-04-11 DIAGNOSIS — F331 Major depressive disorder, recurrent, moderate: Secondary | ICD-10-CM | POA: Diagnosis not present

## 2023-04-11 DIAGNOSIS — F411 Generalized anxiety disorder: Secondary | ICD-10-CM | POA: Diagnosis not present

## 2023-04-11 DIAGNOSIS — K769 Liver disease, unspecified: Secondary | ICD-10-CM | POA: Diagnosis present

## 2023-04-11 DIAGNOSIS — F5105 Insomnia due to other mental disorder: Secondary | ICD-10-CM

## 2023-04-11 DIAGNOSIS — C178 Malignant neoplasm of overlapping sites of small intestine: Secondary | ICD-10-CM | POA: Diagnosis present

## 2023-04-11 MED ORDER — COPPER CU 64 DOTATATE 1 MCI/ML IV SOLN
4.0000 | Freq: Once | INTRAVENOUS | Status: AC
  Administered 2023-04-11: 3.86 via INTRAVENOUS

## 2023-04-11 NOTE — Progress Notes (Signed)
Wendy Barber [id number] 02-Jan-1961 62 y.o.  Virtual Visit via Video Note  I connected with pt @ on 04/11/23 at  2:30 PM EDT by a video enabled telemedicine application and verified that I am speaking with the correct person using two identifiers.   I discussed the limitations of evaluation and management by telemedicine and the availability of in person appointments. The patient expressed understanding and agreed to proceed.  I discussed the assessment and treatment plan with the patient. The patient was provided an opportunity to ask questions and all were answered. The patient agreed with the plan and demonstrated an understanding of the instructions.   The patient was advised to call back or seek an in-person evaluation if the symptoms worsen or if the condition fails to improve as anticipated.  I provided 30 minutes of non-face-to-face time during this encounter.  The patient was located at home.  The provider was located at [facility].   [name], NP   Subjective:   Patient ID:  Wendy Barber is a 62 y.o. (DOB July 10, 1961) female.  Chief Complaint:  Chief Complaint  Patient presents with   Anxiety   Depression   Follow-up   Patient Education   Medication Problem    HPI  62 year old female presents to this office  for follow up and medication management.   Still facing a multitude of health issues. Had PET scan this am awaiting results. She never started the [facility] as instructed due to fear of starting medical medication same time. She would like to try to start now. She has weaned down on her Wellbutrin.  She understands that depression related to chronic disease is very difficult to treat. Reports her anxiety today at 3/10 and depression at 4/10. She is sleeping 6 hours per night. Her family remains highly supportive. She denies any mania, no psychosis, No current SI or HI.       No prior psychiatric medications    Review of Systems:  Review of  Systems  Constitutional: Negative.   Allergic/Immunologic: Negative.   Neurological: Negative.   Psychiatric/Behavioral:  Positive for dysphoric mood.     Medications: I have reviewed the patient's current medications.  Current Outpatient Medications  Medication Sig Dispense Refill   amLODipine (NORVASC) 5 MG tablet TAKE 1 TABLET(5 MG) BY MOUTH DAILY (Patient taking differently: Take 5 mg by mouth daily.) 90 tablet 1   buPROPion (WELLBUTRIN XL) 300 MG 24 hr tablet TAKE 1 TABLET(300 MG) BY MOUTH DAILY (Patient taking differently: Take 300 mg by mouth in the morning.) 90 tablet 0   busPIRone (BUSPAR) 15 MG tablet Take 1 tablet (15 mg total) by mouth 3 (three) times daily. 270 tablet 3   citalopram (CELEXA) 40 MG tablet Take 1 tablet (40 mg total) by mouth daily. 90 tablet 1   cyanocobalamin 1000 MCG tablet Take 1 tablet (1,000 mcg total) by mouth daily.     Dextromethorphan-buPROPion ER (AUVELITY) 45-105 MG TBCR Take one tablet by mouth for 3 days, then take two tablet daily 7-8 hours between doses 60 tablet 1   dicyclomine (BENTYL) 20 MG tablet TAKE 1 TABLET(20 MG) BY MOUTH THREE TIMES DAILY BEFORE MEALS (Patient taking differently: Take 20 mg by mouth 3 (three) times daily before meals.) 90 tablet 4   diphenoxylate-atropine (LOMOTIL) 2.5-0.025 MG tablet TAKE 2 TABLETS IN THE MORNING, AT NOON, IN THE EVENING AND AT BEDTIME Strength: 2.5-0.025 mg 240 tablet 3   eszopiclone (LUNESTA) 2 MG TABS tablet TAKE  1 TABLET(2 MG) BY MOUTH AT BEDTIME AS NEEDED FOR SLEEP (Patient taking differently: Take 2 mg by mouth at bedtime.) 30 tablet 3   famotidine (PEPCID) 20 MG tablet TAKE 2 TABLETS TWICE A DAY 360 tablet 3   ferrous sulfate 325 (65 FE) MG tablet Take 1 tablet (325 mg total) by mouth daily. 30 tablet 3   fondaparinux (ARIXTRA) 10 MG/0.8ML SOLN injection Inject 0.8 mLs (10 mg total) into the skin daily. 72 mL 3   hydrocortisone (ANUSOL-HC) 2.5 % rectal cream Place 1 Application rectally 4 (four)  times daily as needed for hemorrhoids or anal itching. 30 g 1   HYDROmorphone (DILAUDID) 4 MG tablet Take 1 tablet (4 mg total) by mouth every 6 (six) hours as needed for severe pain. 30 tablet 0   loperamide (IMODIUM) 2 MG capsule Take 2 capsules (4 mg total) by mouth 4 (four) times daily as needed for diarrhea or loose stools. (Patient taking differently: Take 4 mg by mouth in the morning, at noon, in the evening, and at bedtime.)     meclizine (ANTIVERT) 25 MG tablet TAKE 1 TABLET(25 MG) BY MOUTH THREE TIMES DAILY AS NEEDED FOR DIZZINESS 60 tablet 0   nystatin-triamcinolone ointment (MYCOLOG) Apply 1 Application topically 2 (two) times daily. For up to 7 days and then STOP.  Repeat as needed for flares after at least a 2 week break 30 g 0   ondansetron (ZOFRAN) 8 MG tablet Take 8 mg by mouth See admin instructions. Am yes and 2 day if neneced     Opium 10 MG/ML (1%) TINC Take 0.6 mLs (6 mg total) by mouth every 3 (three) hours as needed for diarrhea or loose stools. 118 mL 0   orphenadrine (NORFLEX) 100 MG tablet TAKE 1 TABLET(100 MG) BY MOUTH AT BEDTIME AS NEEDED FOR MUSCLE SPASMS 30 tablet 2   pantoprazole (PROTONIX) 40 MG tablet Take 40 mg by mouth daily before breakfast.     potassium chloride (KLOR-CON M) 10 MEQ tablet Take 2 tablets (20 mEq total) by mouth daily. 60 tablet 1   pramoxine-hydrocortisone (PROCTOCREAM-HC) 1-1 % rectal cream Place 1 Application rectally 2 (two) times daily. (Patient taking differently: Place 1 Application rectally 2 (two) times daily as needed for hemorrhoids or anal itching (if hydrocortisone 2.5% cream is not available).) 30 g 3   promethazine (PHENERGAN) 12.5 MG tablet TAKE 1 TABLET(12.5 MG) BY MOUTH TWICE DAILY AS NEEDED 30 tablet 3   propranolol (INDERAL) 20 MG tablet TAKE 1 TABLET(20 MG) BY MOUTH TWICE DAILY 180 tablet 1   No current facility-administered medications for this visit.    Medication Side Effects: None  Allergies:  Allergies  Allergen  Reactions   Penicillins Shortness Of Breath and Other (See Comments)    Irregular and rapid Heart Rate, too    Alprazolam Hives and Other (See Comments)    Hard to arouse, unresponsiveness also   Ativan [Lorazepam] Swelling and Other (See Comments)    Face & Throat Swelling  Note: tolerates midazolam fine   Corticosteroids Other (See Comments)    "Psychotic behavior"    Erythromycin Other (See Comments)    Severe stomach pain    Gabapentin Other (See Comments)    Made the patient feel depressed   Savella  [Milnacipran] Other (See Comments)    Reaction not noted   Prednisolone Anxiety   Prednisone Anxiety and Other (See Comments)    "Anxiety & Nervous Breakdown"    Past Medical History:  Diagnosis  Date   Arthritis    Back pain    Cancer [facility])    pseudomyxoma peritonei   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   Chronic fatigue syndrome    Diabetes mellitus without complication (HCC)    DVT of deep femoral vein, left (HCC) 03/19/2020   Fibromyalgia    Goals of care, counseling/discussion 03/19/2020   Hypertension    Iron deficiency anemia due to chronic blood loss 05/14/2021   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   Pernicious anemia 05/14/2021   Presence of IVC filter 03/19/2020   Pulmonary embolism, bilateral (HCC) 03/19/2020   Short bowel syndrome, unspecified     Family History  Problem Relation Age of Onset   Depression Mother    Drug abuse Sister    Suicidality Sister    Alcohol abuse Brother    Drug abuse Brother     Social History   Socioeconomic History   Marital status: Married    Spouse name: Not on file   Number of children: 3   Years of education: Not on file   Highest education level: Some college, no degree  Occupational History   Occupation: disability  Tobacco Use   Smoking status: Never   Smokeless tobacco: Never  Vaping Use   Vaping Use: Never used  Substance and Sexual Activity    Alcohol use: No   Drug use: Never   Sexual activity: Yes    Birth control/protection: None    Comment: hysterectomy  Other Topics Concern   Not on file  Social History Narrative   Right handed   One story home   Drinks occasional caffeine   Social Determinants of Health   Financial Resource Strain: Low Risk  (03/09/2023)   Overall Financial Resource Strain (CARDIA)    Difficulty of Paying Living Expenses: Not hard at all  Food Insecurity: No Food Insecurity (03/09/2023)   Hunger Vital Sign    Worried About Running Out of Food in the Last Year: Never true    Ran Out of Food in the Last Year: Never true  Transportation Needs: No Transportation Needs (03/09/2023)   PRAPARE - Administrator, Civil Service (Medical): No    Lack of Transportation (Non-Medical): No  Physical Activity: Inactive (03/09/2023)   Exercise Vital Sign    Days of Exercise per Week: 0 days    Minutes of Exercise per Session: 0 min  Stress: Stress Concern Present (03/09/2023)   [employer] of Occupational Health - Occupational Stress Questionnaire    Feeling of Stress : Rather much  Social Connections: Moderately Integrated (03/09/2023)   Social Connection and Isolation Panel [NHANES]    Frequency of Communication with Friends and Family: Twice a week    Frequency of Social Gatherings with Friends and Family: Once a week    Attends Religious Services: More than 4 times per year    Active Member of [employer] or Organizations: No    Attends Banker Meetings: Never    Marital Status: Married  Catering manager Violence: Not At Risk (03/09/2023)   Humiliation, Afraid, Rape, and Kick questionnaire    Fear of Current or Ex-Partner: No    Emotionally Abused: No    Physically Abused: No    Sexually Abused: No    Past Medical History, Surgical history, Social history, and Family history were reviewed and updated as appropriate.   Please see review of systems for further details on the  patient's review from  today.   Objective:   Physical Exam:  There were no vitals taken for this visit.  Physical Exam Neurological:     Mental Status: She is alert and oriented to person, place, and time.  Psychiatric:        Attention and Perception: Attention and perception normal.        Mood and Affect: Mood normal.        Speech: Speech normal.        Behavior: Behavior normal. Behavior is cooperative.        Cognition and Memory: Cognition and memory normal.        Judgment: Judgment normal.     Comments: Insight intact     Lab Review:     Component Value Date/Time   NA 139 03/15/2023 1544   K 4.3 03/15/2023 1544   CL 101 03/15/2023 1544   CO2 26 03/15/2023 1544   GLUCOSE 154 (H) 03/15/2023 1544   BUN 24 (H) 03/15/2023 1544   CREATININE 1.07 (H) 03/15/2023 1544   CALCIUM 9.8 03/15/2023 1544   PROT 7.3 03/15/2023 1544   ALBUMIN 4.1 03/15/2023 1544   AST 23 03/15/2023 1544   ALT 26 03/15/2023 1544   ALKPHOS 142 (H) 03/15/2023 1544   BILITOT 0.4 03/15/2023 1544   GFRNONAA 59 (L) 03/15/2023 1544   GFRAA >60 07/27/2020 0922       Component Value Date/Time   WBC 7.8 03/15/2023 1544   WBC 9.3 12/09/2022 0200   RBC 3.93 03/15/2023 1544   HGB 12.1 03/15/2023 1544   HCT 37.6 03/15/2023 1544   PLT 203 03/15/2023 1544   MCV 95.7 03/15/2023 1544   MCH 30.8 03/15/2023 1544   MCHC 32.2 03/15/2023 1544   RDW 13.7 03/15/2023 1544   LYMPHSABS 3.6 03/15/2023 1544   MONOABS 0.7 03/15/2023 1544   EOSABS 0.2 03/15/2023 1544   BASOSABS 0.1 03/15/2023 1544    No results found for: "POCLITH", "LITHIUM"   No results found for: "PHENYTOIN", "PHENOBARB", "VALPROATE", "CBMZ"   .res Assessment: Plan:     Greater than 50% of 30 min visit with patient was spent on counseling and coordination of care.  We discussed her current level of stability with anxiety and depression. We talked about her Chronic and serious health issues complicating tx options for depression.  She  agreed to start the Auvelity now since she has been on new medical medication for several weeks now. She also has started Opium Tinture for her Diarrhea.  We agreed to: Continue Wellbutrin to 150 mg XR daily for 5 days until on day four of Auvelity and then stop.   To start Auvelity, 45-90 mg daily for 3 days, then 90-210 mg daily.   Continue Buspar  to 15 mg to  three times daily as needed. Continue Celexa to 40 mg daily. Will take with food to help with nausea. Could make diarrhea worse in the beginning but may pass after a week or two.  To Continue Lunesta 5 mg at betime. Belsomra was not approved by insurance. She is happy with Alfonso Patten but says causes metallic taste in mouth next day.  Will report worsening symptoms or side effects promptly To follow up in 4 weeks to reassess Will continue to follow up with PCP regularly Provided emergency contact information Reviewed PDMP       There are no diagnoses linked to this encounter.   Please see After Visit Summary for patient specific instructions.  Future Appointments  Date Time Provider  Department Center  04/17/2023 10:50 AM [name], [name], MD [facility] None    No orders of the defined types were placed in this encounter.     -------------------------------

## 2023-04-12 ENCOUNTER — Encounter: Payer: Self-pay | Admitting: Hematology & Oncology

## 2023-04-12 ENCOUNTER — Encounter: Payer: Self-pay | Admitting: Family Medicine

## 2023-04-12 NOTE — Telephone Encounter (Signed)
If possible I would like to see her in the office first that way we can evaluate that area and then order the appropriate imaging.  If any symptoms they would typically want a diagnostic mammogram plus or minus ultrasound.  I also would like some details as far as what I feel on exam.  Okay to place into any acute visit the next few days if possible.  Next week would also be okay if that works better for her.

## 2023-04-12 NOTE — Telephone Encounter (Signed)
Okay for me to order Mammo?

## 2023-04-17 ENCOUNTER — Encounter: Payer: Self-pay | Admitting: Family Medicine

## 2023-04-17 ENCOUNTER — Ambulatory Visit (INDEPENDENT_AMBULATORY_CARE_PROVIDER_SITE_OTHER): Payer: Medicare Other | Admitting: Family Medicine

## 2023-04-17 ENCOUNTER — Other Ambulatory Visit: Payer: Self-pay | Admitting: Internal Medicine

## 2023-04-17 ENCOUNTER — Encounter: Payer: Self-pay | Admitting: Internal Medicine

## 2023-04-17 ENCOUNTER — Other Ambulatory Visit: Payer: Self-pay | Admitting: Surgical Oncology

## 2023-04-17 ENCOUNTER — Ambulatory Visit (INDEPENDENT_AMBULATORY_CARE_PROVIDER_SITE_OTHER): Payer: Medicare Other | Admitting: Internal Medicine

## 2023-04-17 VITALS — BP 116/72 | HR 80 | Temp 97.8°F | Ht 66.0 in | Wt 252.6 lb

## 2023-04-17 VITALS — BP 124/82 | HR 79 | Ht 66.0 in | Wt 254.0 lb

## 2023-04-17 DIAGNOSIS — E041 Nontoxic single thyroid nodule: Secondary | ICD-10-CM | POA: Diagnosis not present

## 2023-04-17 DIAGNOSIS — D352 Benign neoplasm of pituitary gland: Secondary | ICD-10-CM | POA: Diagnosis not present

## 2023-04-17 DIAGNOSIS — C786 Secondary malignant neoplasm of retroperitoneum and peritoneum: Secondary | ICD-10-CM

## 2023-04-17 DIAGNOSIS — N6314 Unspecified lump in the right breast, lower inner quadrant: Secondary | ICD-10-CM

## 2023-04-17 DIAGNOSIS — C181 Malignant neoplasm of appendix: Secondary | ICD-10-CM

## 2023-04-17 NOTE — Progress Notes (Unsigned)
Name: Wendy Barber  MRN/ DOB: [id number], 28-Aug-1961    Age/ Sex: 62 y.o., female    PCP: Shade Flood, MD   Reason for Endocrinology Evaluation: Pituitary adenoma     Date of Initial Endocrinology Evaluation: 04/17/2023     HPI: [name] is a 62 y.o. female with a past medical history of HTN, CAD, Hx DVT/PE, Cancer of appendic with mets , autoimmune hepatitis and DM. The patient presented for initial endocrinology clinic visit on 04/17/2023 for consultative assistance with her pituitary adenoma.   During evaluation by neurology for vertigo and cerebral meningioma she was noted to have a pituitary adenoma on brain MRI 2023    Patient follows with oncology for history of metastatic appendiceal cancer, s/p surgery 2022    Patient had an incidental finding of thyroid nodule on CT scan which prompted thyroid ultrasound 11/2022, revealing a right inferior 2.3 cm nodule meeting follow-up criteria.  She has diarrhea, creon was recommended by surgeon      She has occasional headaches which have worsened over the past year Has worsening  nausea with headaches Has noted decrease in Vision, last eye exam ~ 1 yr ago    She has not taken in glucocorticoids in > 3 months  Denies local neck swelling  Has occasional Dysphagia  Continues with diarrhea for the past 2.5 yrs  She is not on Vitamins  Has LE edema with pain in the legs  Has been diagnosed with hypokalemia in the past, not currently on Kcl  Has abd  S/P hysterectomy 2004 , no HRT         HISTORY:  Past Medical History:  Past Medical History:  Diagnosis Date   Arthritis    Back pain    Cancer (HCC)    pseudomyxoma peritonei   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   Chronic fatigue syndrome    Diabetes mellitus without complication (HCC)    DVT of deep femoral vein, left (HCC) 03/19/2020   Fibromyalgia    Goals of care, counseling/discussion 03/19/2020    Hypertension    Iron deficiency anemia due to chronic blood loss 05/14/2021   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   Pernicious anemia 05/14/2021   Presence of IVC filter 03/19/2020   Pulmonary embolism, bilateral (HCC) 03/19/2020   Short bowel syndrome, unspecified    Past Surgical History:  Past Surgical History:  Procedure Laterality Date   ABDOMINAL HYSTERECTOMY     ABDOMINAL SURGERY     ACHILLES TENDON REPAIR     APPENDECTOMY     ARTHROPLASTY     CARPAL TUNNEL RELEASE     CHOLECYSTECTOMY     IR CATHETER TUBE CHANGE  02/02/2021   IR CATHETER TUBE CHANGE  02/25/2021   IR IMAGING GUIDED PORT INSERTION  06/20/2022   IR RADIOLOGIST EVAL & MGMT  02/24/2021   IR RADIOLOGIST EVAL & MGMT  03/10/2021   IR RADIOLOGIST EVAL & MGMT  06/22/2022   IR THROMBECT VENO MECH MOD SED  06/20/2022   IR US GUIDE BX ASP/DRAIN  11/25/2019   IR US GUIDE VASC ACCESS LEFT  06/20/2022   IR US GUIDE VASC ACCESS RIGHT  06/20/2022   IR US GUIDE VASC ACCESS RIGHT  06/20/2022   IR VENO/EXT/BI  06/20/2022   IR VENOCAVAGRAM IVC  06/20/2022   JOINT REPLACEMENT     KNEE ARTHROSCOPY     ORIF ANKLE FRACTURE Right 08/27/2019   Procedure: OPEN REDUCTION  INTERNAL FIXATION RIGHT ANKLE FRACTURE;  Surgeon: Sheral Apley, MD;  Location: [facility];  Service: Orthopedics;  Laterality: Right;   perineorrophy     TONGUE BIOPSY      Social History:  reports that she has never smoked. She has never used smokeless tobacco. She reports that she does not drink alcohol and does not use drugs. Family History: family history includes Alcohol abuse in her brother; Depression in her mother; Drug abuse in her brother and sister; Suicidality in her sister.   HOME MEDICATIONS: Allergies as of 04/17/2023       Reactions   Penicillins Shortness Of Breath, Other (See Comments)   Irregular and rapid Heart Rate, too   Alprazolam Hives, Other (See Comments)   Hard to arouse, unresponsiveness also   Ativan [lorazepam] Swelling, Other  (See Comments)   Face & Throat Swelling  Note: tolerates midazolam fine   Corticosteroids Other (See Comments)   "Psychotic behavior"   Erythromycin Other (See Comments)   Severe stomach pain   Gabapentin Other (See Comments)   Made the patient feel depressed   Savella  [milnacipran] Other (See Comments)   Reaction not noted   Prednisolone Anxiety   Prednisone Anxiety, Other (See Comments)   "Anxiety & Nervous Breakdown"        Medication List        Accurate as of April 17, 2023  7:25 AM. If you have any questions, ask your nurse or doctor.          amLODipine 5 MG tablet Commonly known as: NORVASC TAKE 1 TABLET(5 MG) BY MOUTH DAILY What changed: See the new instructions.   Auvelity 45-105 MG Tbcr Generic drug: Dextromethorphan-buPROPion ER Take one tablet by mouth for 3 days, then take two tablet daily 7-8 hours between doses   buPROPion 300 MG 24 hr tablet Commonly known as: WELLBUTRIN XL TAKE 1 TABLET(300 MG) BY MOUTH DAILY What changed: See the new instructions.   busPIRone 15 MG tablet Commonly known as: BUSPAR Take 1 tablet (15 mg total) by mouth 3 (three) times daily.   citalopram 40 MG tablet Commonly known as: CELEXA Take 1 tablet (40 mg total) by mouth daily.   cyanocobalamin 1000 MCG tablet Take 1 tablet (1,000 mcg total) by mouth daily.   dicyclomine 20 MG tablet Commonly known as: BENTYL TAKE 1 TABLET(20 MG) BY MOUTH THREE TIMES DAILY BEFORE MEALS What changed: See the new instructions.   diphenoxylate-atropine 2.5-0.025 MG tablet Commonly known as: LOMOTIL TAKE 2 TABLETS IN THE MORNING, AT NOON, IN THE EVENING AND AT BEDTIME Strength: 2.5-0.025 mg   eszopiclone 2 MG Tabs tablet Commonly known as: LUNESTA TAKE 1 TABLET(2 MG) BY MOUTH AT BEDTIME AS NEEDED FOR SLEEP   famotidine 20 MG tablet Commonly known as: PEPCID TAKE 2 TABLETS TWICE A DAY   ferrous sulfate 325 (65 FE) MG tablet Take 1 tablet (325 mg total) by mouth daily.    fondaparinux 10 MG/0.8ML Soln injection Commonly known as: Arixtra Inject 0.8 mLs (10 mg total) into the skin daily.   hydrocortisone 2.5 % rectal cream Commonly known as: ANUSOL-HC Place 1 Application rectally 4 (four) times daily as needed for hemorrhoids or anal itching.   HYDROmorphone 4 MG tablet Commonly known as: Dilaudid Take 1 tablet (4 mg total) by mouth every 6 (six) hours as needed for severe pain.   loperamide 2 MG capsule Commonly known as: IMODIUM Take 2 capsules (4 mg total) by mouth 4 (four) times daily  as needed for diarrhea or loose stools. What changed: when to take this   meclizine 25 MG tablet Commonly known as: ANTIVERT TAKE 1 TABLET(25 MG) BY MOUTH THREE TIMES DAILY AS NEEDED FOR DIZZINESS   nystatin-triamcinolone ointment Commonly known as: MYCOLOG Apply 1 Application topically 2 (two) times daily. For up to 7 days and then STOP.  Repeat as needed for flares after at least a 2 week break   ondansetron 8 MG tablet Commonly known as: ZOFRAN Take 8 mg by mouth See admin instructions. Am yes and 2 day if neneced   Opium 10 MG/ML (1%) Tinc Take 0.6 mLs (6 mg total) by mouth every 3 (three) hours as needed for diarrhea or loose stools.   orphenadrine 100 MG tablet Commonly known as: NORFLEX TAKE 1 TABLET(100 MG) BY MOUTH AT BEDTIME AS NEEDED FOR MUSCLE SPASMS   pantoprazole 40 MG tablet Commonly known as: PROTONIX Take 40 mg by mouth daily before breakfast.   potassium chloride 10 MEQ tablet Commonly known as: KLOR-CON M Take 2 tablets (20 mEq total) by mouth daily.   pramoxine-hydrocortisone 1-1 % rectal cream Commonly known as: PROCTOCREAM-HC Place 1 Application rectally 2 (two) times daily. What changed:  when to take this reasons to take this   promethazine 12.5 MG tablet Commonly known as: PHENERGAN TAKE 1 TABLET(12.5 MG) BY MOUTH TWICE DAILY AS NEEDED   propranolol 20 MG tablet Commonly known as: INDERAL TAKE 1 TABLET(20 MG) BY MOUTH  TWICE DAILY          REVIEW OF SYSTEMS: A comprehensive ROS was conducted with the patient and is negative except as per HPI and below:  ROS     OBJECTIVE:  VS: There were no vitals taken for this visit.   Wt Readings from Last 3 Encounters:  03/15/23 251 lb (113.9 kg)  02/06/23 221 lb (100.2 kg)  12/07/22 241 lb (109.3 kg)     EXAM: General: Pt appears well and is in NAD  Eyes: External eye exam normal without stare, lid lag or exophthalmos.  EOM intact.  PERRL.  Neck: General: Supple without adenopathy. Thyroid: Thyroid size normal.  No goiter or nodules appreciated. No thyroid bruit.  Lungs: Clear with good BS bilat   Heart: Auscultation: RRR.  Abdomen: Soft, nontender  Extremities:  BL LE: No pretibial edema   Mental Status: Judgment, insight: Intact Orientation: Oriented to time, place, and person Mood and affect: No depression, anxiety, or agitation     DATA REVIEWED: ***   Thyroid Ultrasound 12/08/2022    Estimated total number of nodules >/= 1 cm: 1   Number of spongiform nodules >/=  2 cm not described below (TR1): 0   Number of mixed cystic and solid nodules >/= 1.5 cm not described below (TR2): 0   _________________________________________________________   Nodule # 1:   Location: Right; Inferior   Maximum size: 2.3 cm; Other 2 dimensions: 1.5 x 2.3 cm   Composition: solid/almost completely solid (2)   Echogenicity: isoechoic (1)   Shape: not taller-than-wide (0)   Margins: ill-defined (0)   Echogenic foci: none (0)   ACR TI-RADS total points: 3.   ACR TI-RADS risk category: TR3 (3 points).   ACR TI-RADS recommendations:   *Given size (>/= 1.5 - 2.4 cm) and appearance, a follow-up ultrasound in 1 year should be considered based on TI-RADS criteria.   _________________________________________________________   IMPRESSION: The nodule seen on CT imaging corresponds with a 2.3 cm ill-defined TI-RADS category 3 nodule  versus  pseudo nodule in the right lower gland. This lesion meets criteria for imaging surveillance. Recommend follow-up ultrasound in 1 years.    ASSESSMENT/PLAN/RECOMMENDATIONS:   Pituitary adenoma    Medications :     2.  Right thyroid nodule  Signed electronically by: Lyndle Herrlich, MD  [facility] Endocrin[facility]  [facility] [address] [place], [place] [address] Phone: [phone] FAX: [phone]   CC: Shade Flood, MD [address] [place] [address] Phone: [phone] Fax: [phone]   Return to Endocrinology clinic as below: Future Appointments  Date Time Provider Department Center  04/17/2023 10:50 AM Kammy Klett, [name], MD [facility] None  04/17/2023  2:00 PM Shade Flood, MD [facility]

## 2023-04-17 NOTE — Patient Instructions (Signed)
Please have labs done at [facility] :   Address : [address], [address], [place], [place] [address] Phone [phone]  

## 2023-04-17 NOTE — Patient Instructions (Signed)
Thank you for coming into the office today.  I suspect the area on the right breast is likely from bruise or previous injury and should improve with a little bit more time.  However I do think it would be a good idea to image that area and make sure and we will order an ultrasound and mammogram.  The imaging facility should be contacting you shortly.  Let me know if you have not heard from them within the next week or 2.  Take care.

## 2023-04-17 NOTE — Progress Notes (Signed)
Subjective:  Patient ID: Wendy Barber, female    DOB: Nov 01, 1960  Age: 62 y.o. MRN: [id number]  CC:  Chief Complaint  Patient presents with   Breast Mass    Pt notes lump on Rt breast apx 2 weeks, pt denies pain but notes a bruise from earlier, doesn't appear to have changed size, denied discharge from nipple     HPI Wendy Barber presents for   Right breast lump Noticed 2 weeks ago. Had a bruise on lower R breast from dog jumping on her, about 2.5 weeks ago. Noticed a lump in same area as bruise.  Had PET scan last week, 2 lesions noted on liver - undergoing further evaluation of those areas with MRI. No nipple discharge or bleeding.  No skin changes other than bruising  No pain.   History Patient Active Problem List   Diagnosis Date Noted   Pituitary adenoma (HCC) 04/17/2023   Pulmonary emboli (HCC) 12/08/2022   Pulmonary embolism (HCC) 12/07/2022   Thyroid nodule 12/07/2022   Enteritis 07/23/2022   Fall at home, initial encounter 07/23/2022   AMS (altered mental status) 07/23/2022   Hemorrhoids 06/25/2022   Occasional tremors: Essential tremors 06/23/2022   Hypomagnesemia 06/22/2022   Iron deficiency anemia    Gastritis 06/18/2022   Depression 06/18/2022   UTI (urinary tract infection) 06/16/2022   DVT, bilateral lower limbs (HCC) 06/14/2022   Symptomatic bradycardia 06/13/2022   History of excision of intestinal structure 06/13/2022   CAD (coronary artery disease) 06/13/2022   Acute lower GI bleeding 05/04/2022   AKI (acute kidney injury) (HCC) 05/04/2022   History of deep vein thrombosis (DVT) of lower extremity 05/04/2022   History of pulmonary embolus (PE) 05/04/2022   Anxiety 05/04/2022   Pernicious anemia 05/14/2021   Iron deficiency anemia due to chronic blood loss 05/14/2021   Abdominal wall abscess    Surgical wound infection 01/16/2021   C. 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) 03/19/2020   Goals of care, counseling/discussion 03/19/2020   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   DVT of deep femoral vein, left (HCC) 03/19/2020   Pulmonary embolism, bilateral (HCC) 03/19/2020   Presence of IVC filter 03/19/2020   Hepatic encephalopathy (HCC) 11/22/2019   Confusion 11/21/2019   Autoimmune hepatitis (HCC) 11/21/2019   Uncontrolled hypertension 11/21/2019   DM2 (diabetes mellitus, type 2) (HCC) 11/21/2019   Closed right ankle fracture 08/27/2019   Acute deep vein thrombosis (DVT) of femoral vein of left lower extremity (HCC) 08/03/2018   History of colon cancer 08/03/2018   History of partial colectomy 08/03/2018   Spinal stenosis 08/03/2018   Morbid obesity (HCC) 08/03/2018   Cerebral meningioma (HCC) 01/05/2018   Arthritis of right shoulder region 02/18/2017   History of renal calculi 02/14/2016   Midline low back pain 02/14/2016   Class 3 obesity (HCC) 01/07/2016   Migraine with aura and without status migrainosus, not intractable 01/07/2016   Chronic fatigue syndrome 09/23/2013   GERD (gastroesophageal reflux disease) 09/23/2013   History of hepatitis A 09/23/2013   Rheumatoid arthritis involving multiple sites (HCC) 09/23/2013   Past Medical History:  Diagnosis Date   Arthritis    Back pain    Cancer [facility])    pseudomyxoma peritonei   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   Chronic fatigue syndrome    Diabetes mellitus without complication (HCC)  DVT of deep femoral vein, left (HCC) 03/19/2020   Fibromyalgia    Goals of care, counseling/discussion 03/19/2020   Hypertension    Iron deficiency anemia due to chronic blood loss 05/14/2021   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   Pernicious anemia 05/14/2021   Presence of IVC filter 03/19/2020   Pulmonary embolism, bilateral (HCC) 03/19/2020   Short bowel syndrome, unspecified     Past Surgical History:  Procedure Laterality Date   ABDOMINAL HYSTERECTOMY     ABDOMINAL SURGERY     ACHILLES TENDON REPAIR     APPENDECTOMY     ARTHROPLASTY     CARPAL TUNNEL RELEASE     CHOLECYSTECTOMY     IR CATHETER TUBE CHANGE  02/02/2021   IR CATHETER TUBE CHANGE  02/25/2021   IR IMAGING GUIDED PORT INSERTION  06/20/2022   IR RADIOLOGIST EVAL & MGMT  02/24/2021   IR RADIOLOGIST EVAL & MGMT  03/10/2021   IR RADIOLOGIST EVAL & MGMT  06/22/2022   IR THROMBECT VENO MECH MOD SED  06/20/2022   IR US GUIDE BX ASP/DRAIN  11/25/2019   IR US GUIDE VASC ACCESS LEFT  06/20/2022   IR US GUIDE VASC ACCESS RIGHT  06/20/2022   IR US GUIDE VASC ACCESS RIGHT  06/20/2022   IR VENO/EXT/BI  06/20/2022   IR VENOCAVAGRAM IVC  06/20/2022   JOINT REPLACEMENT     KNEE ARTHROSCOPY     ORIF ANKLE FRACTURE Right 08/27/2019   Procedure: OPEN REDUCTION INTERNAL FIXATION RIGHT ANKLE FRACTURE;  Surgeon: Sheral Apley, MD;  Location: [facility];  Service: Orthopedics;  Laterality: Right;   perineorrophy     TONGUE BIOPSY     Allergies  Allergen Reactions   Penicillins Shortness Of Breath and Other (See Comments)    Irregular and rapid Heart Rate, too    Alprazolam Hives and Other (See Comments)    Hard to arouse, unresponsiveness also   Ativan [Lorazepam] Swelling and Other (See Comments)    Face & Throat Swelling  Note: tolerates midazolam fine   Corticosteroids Other (See Comments)    "Psychotic behavior"    Erythromycin Other (See Comments)    Severe stomach pain    Gabapentin Other (See Comments)    Made the patient feel depressed   Savella  [Milnacipran] Other (See Comments)    Reaction not noted   Prednisolone Anxiety   Prednisone Anxiety and Other (See Comments)    "Anxiety & Nervous Breakdown"   Prior to Admission medications   Medication Sig Start Date End Date Taking? Authorizing Provider  amLODipine (NORVASC) 5 MG tablet TAKE 1 TABLET(5 MG) BY MOUTH DAILY Patient taking differently: Take 5  mg by mouth daily. 11/09/22  Yes Shade Flood, MD  busPIRone (BUSPAR) 15 MG tablet Take 1 tablet (15 mg total) by mouth 3 (three) times daily. 04/05/22  Yes [name], NP  citalopram (CELEXA) 40 MG tablet Take 1 tablet (40 mg total) by mouth daily. 09/21/22  Yes [name], NP  cyanocobalamin 1000 MCG tablet Take 1 tablet (1,000 mcg total) by mouth daily. 07/26/22  Yes Lonia Blood, MD  Dextromethorphan-buPROPion ER (AUVELITY) 45-105 MG TBCR Take one tablet by mouth for 3 days, then take two tablet daily 7-8 hours between doses 03/07/23  Yes White, Brian A, NP  dicyclomine (BENTYL) 20 MG tablet TAKE 1 TABLET(20 MG) BY MOUTH THREE TIMES DAILY BEFORE MEALS Patient taking differently: Take 20 mg by mouth 3 (three) times daily before meals.  10/25/22  Yes Ennever, Rose Phi, MD  diphenoxylate-atropine (LOMOTIL) 2.5-0.025 MG tablet TAKE 2 TABLETS IN THE MORNING, AT NOON, IN THE EVENING AND AT BEDTIME Strength: 2.5-0.025 mg 12/19/22  Yes Ennever, Rose Phi, MD  eszopiclone (LUNESTA) 2 MG TABS tablet TAKE 1 TABLET(2 MG) BY MOUTH AT BEDTIME AS NEEDED FOR SLEEP 04/15/23  Yes [name], NP  ferrous sulfate 325 (65 FE) MG tablet Take 1 tablet (325 mg total) by mouth daily. 06/03/22 06/03/23 Yes Shah, Pratik D, DO  fondaparinux (ARIXTRA) 10 MG/0.8ML SOLN injection Inject 0.8 mLs (10 mg total) into the skin daily. 03/15/23  Yes [name], MD  hydrocortisone (ANUSOL-HC) 2.5 % rectal cream Place 1 Application rectally 4 (four) times daily as needed for hemorrhoids or anal itching. 02/13/23  Yes Ennever, Rose Phi, MD  loperamide (IMODIUM) 2 MG capsule Take 2 capsules (4 mg total) by mouth 4 (four) times daily as needed for diarrhea or loose stools. Patient taking differently: Take 4 mg by mouth in the morning, at noon, in the evening, and at bedtime. 05/08/22  Yes Glade Lloyd, MD  meclizine (ANTIVERT) 25 MG tablet TAKE 1 TABLET(25 MG) BY MOUTH THREE TIMES DAILY AS NEEDED FOR DIZZINESS 04/07/23  Yes  Ennever, Rose Phi, MD  nystatin-triamcinolone ointment (MYCOLOG) Apply 1 Application topically 2 (two) times daily. For up to 7 days and then STOP.  Repeat as needed for flares after at least a 2 week break 01/30/23  Yes [name], DO  ondansetron (ZOFRAN) 8 MG tablet Take 8 mg by mouth See admin instructions. Am yes and 2 day if neneced 03/03/21  Yes [provider]  Opium 10 MG/ML (1%) TINC Take 0.6 mLs (6 mg total) by mouth every 3 (three) hours as needed for diarrhea or loose stools. 03/15/23  Yes Ennever, Rose Phi, MD  orphenadrine (NORFLEX) 100 MG tablet TAKE 1 TABLET(100 MG) BY MOUTH AT BEDTIME AS NEEDED FOR MUSCLE SPASMS 04/07/23  Yes Ennever, Rose Phi, MD  pantoprazole (PROTONIX) 40 MG tablet Take 40 mg by mouth daily before breakfast.   Yes [provider]  pramoxine-hydrocortisone (PROCTOCREAM-HC) 1-1 % rectal cream Place 1 Application rectally 2 (two) times daily. Patient taking differently: Place 1 Application rectally 2 (two) times daily as needed for hemorrhoids or anal itching (if hydrocortisone 2.5% cream is not available). 07/04/22  Yes [name], MD  promethazine (PHENERGAN) 12.5 MG tablet TAKE 1 TABLET(12.5 MG) BY MOUTH TWICE DAILY AS NEEDED 03/24/23  Yes Shade Flood, MD  propranolol (INDERAL) 20 MG tablet TAKE 1 TABLET(20 MG) BY MOUTH TWICE DAILY 02/06/23  Yes Shade Flood, MD  buPROPion (WELLBUTRIN XL) 300 MG 24 hr tablet TAKE 1 TABLET(300 MG) BY MOUTH DAILY Patient not taking: Reported on 04/17/2023 11/28/22   [name], NP  HYDROmorphone (DILAUDID) 4 MG tablet Take 1 tablet (4 mg total) by mouth every 6 (six) hours as needed for severe pain. Patient not taking: Reported on 04/17/2023 10/12/22   [name], MD  potassium chloride (KLOR-CON M) 10 MEQ tablet Take 2 tablets (20 mEq total) by mouth daily. Patient not taking: Reported on 04/17/2023 07/07/22   Shade Flood, MD   Social History   Socioeconomic History   Marital status:  Married    Spouse name: Not on file   Number of children: 3   Years of education: Not on file   Highest education level: Some college, no degree  Occupational History   Occupation: disability  Tobacco Use   Smoking status: Never   Smokeless tobacco: Never  Vaping Use   Vaping Use: Never used  Substance and Sexual Activity   Alcohol use: No   Drug use: Never   Sexual activity: Yes    Birth control/protection: None    Comment: hysterectomy  Other Topics Concern   Not on file  Social History Narrative   Right handed   One story home   Drinks occasional caffeine   Social Determinants of Health   Financial Resource Strain: Medium Risk (04/13/2023)   Overall Financial Resource Strain (CARDIA)    Difficulty of Paying Living Expenses: Somewhat hard  Food Insecurity: No Food Insecurity (04/13/2023)   Hunger Vital Sign    Worried About Running Out of Food in the Last Year: Never true    Ran Out of Food in the Last Year: Never true  Transportation Needs: No Transportation Needs (04/13/2023)   PRAPARE - Administrator, Civil Service (Medical): No    Lack of Transportation (Non-Medical): No  Physical Activity: Insufficiently Active (04/13/2023)   Exercise Vital Sign    Days of Exercise per Week: 1 day    Minutes of Exercise per Session: 20 min  Stress: Stress Concern Present (04/13/2023)   [employer] of Occupational Health - Occupational Stress Questionnaire    Feeling of Stress : Rather much  Social Connections: Moderately Integrated (04/13/2023)   Social Connection and Isolation Panel [NHANES]    Frequency of Communication with Friends and Family: Once a week    Frequency of Social Gatherings with Friends and Family: Once a week    Attends Religious Services: More than 4 times per year    Active Member of [employer] or Organizations: Yes    Attends Banker Meetings: More than 4 times per year    Marital Status: Married  Catering manager Violence: Not At  Risk (03/09/2023)   Humiliation, Afraid, Rape, and Kick questionnaire    Fear of Current or Ex-Partner: No    Emotionally Abused: No    Physically Abused: No    Sexually Abused: No    Review of Systems   Objective:   Vitals:   04/17/23 1407  BP: 116/72  Pulse: 80  Temp: 97.8 F (36.6 C)  TempSrc: Temporal  SpO2: 95%  Weight: 252 lb 9.6 oz (114.6 kg)  Height: 5\' 6"  (1.676 m)     Physical Exam Exam conducted with a chaperone present Wendy Barber).  Constitutional:      General: She is not in acute distress.    Appearance: Normal appearance. She is well-developed.  HENT:     Head: Normocephalic and atraumatic.  Cardiovascular:     Rate and Rhythm: Normal rate.  Pulmonary:     Effort: Pulmonary effort is normal.  Chest:     Chest wall: No mass, lacerations, deformity, swelling or tenderness.  Breasts:    Right: Mass (Approximately 1 cm firm rounded area just under the skin of the areola, inferior to ecchymosis.  Nontender.  Mobile.  6:00.) and skin change (faint echymosis inferior areola - 5:00-6:00) present. No swelling, bleeding, inverted nipple, nipple discharge or tenderness.     Left: No swelling, bleeding, inverted nipple, mass, nipple discharge, skin change or tenderness.  Lymphadenopathy:     Upper Body:     Right upper body: No supraclavicular or axillary adenopathy.     Left upper body: No supraclavicular or axillary adenopathy.  Neurological:     Mental Status: She is  alert and oriented to person, place, and time.  Psychiatric:        Mood and Affect: Mood normal.        Assessment & Plan:  Wendy Barber is a 62 y.o. female . Lump in lower inner quadrant of right breast - Plan: MM Digital Diagnostic Unilat R, US BREAST COMPLETE UNI RIGHT INC AXILLA Suspected posttraumatic hematoma to inferior areola, breast tissue.  Possible small residual hematoma.  Check imaging with diagnostic mammogram, ultrasound.  Understanding concern given history of intestinal  neoplasm, and recent abnormal PET scan. Further workup depending on imaging.   No orders of the defined types were placed in this encounter.  There are no Patient Instructions on file for this visit.    Signed,   Meredith Staggers, MD [facility], [facility] 04/17/23 2:31 PM

## 2023-04-18 LAB — FOLLICLE STIMULATING HORMONE: FSH: 21.8 m[IU]/mL — ABNORMAL LOW (ref 25.8–134.8)

## 2023-04-18 LAB — BASIC METABOLIC PANEL: CO2: 20 mmol/L (ref 20–29)

## 2023-04-18 LAB — INSULIN-LIKE GROWTH FACTOR: Insulin-Like GF-1: 92 ng/mL (ref 60–207)

## 2023-04-19 ENCOUNTER — Encounter: Payer: Self-pay | Admitting: Internal Medicine

## 2023-04-19 LAB — BASIC METABOLIC PANEL
BUN/Creatinine Ratio: 18 (ref 12–28)
BUN: 19 mg/dL (ref 8–27)
Calcium: 9.3 mg/dL (ref 8.7–10.3)
Chloride: 106 mmol/L (ref 96–106)
Creatinine, Ser: 1.03 mg/dL — ABNORMAL HIGH (ref 0.57–1.00)
Glucose: 140 mg/dL — ABNORMAL HIGH (ref 70–99)
Potassium: 4.6 mmol/L (ref 3.5–5.2)
Sodium: 140 mmol/L (ref 134–144)
eGFR: 62 mL/min/{1.73_m2} (ref >59)

## 2023-04-19 LAB — ACTH: ACTH: 78 pg/mL — ABNORMAL HIGH (ref 7.2–63.3)

## 2023-04-19 LAB — CORTISOL: Cortisol: 15.4 ug/dL (ref 6.2–19.4)

## 2023-04-19 LAB — PROLACTIN: Prolactin: 49.9 ng/mL — ABNORMAL HIGH (ref 3.6–25.2)

## 2023-04-20 ENCOUNTER — Encounter: Payer: Self-pay | Admitting: Family Medicine

## 2023-04-20 ENCOUNTER — Encounter: Payer: Self-pay | Admitting: Hematology & Oncology

## 2023-04-20 MED ORDER — DIAZEPAM 5 MG PO TABS: ORAL_TABLET | ORAL | 0 refills | Status: DC

## 2023-04-20 NOTE — Telephone Encounter (Signed)
As discussed at last visit pt is requesting more medication for the sake of her anxiety during MRI

## 2023-04-20 NOTE — Telephone Encounter (Signed)
Message sent to patient, but called patient given allergies. Has taken Valium in past without difficulty, hives or other allergic reaction.  Valium has helped in the past.  Will write for 5 mg number 4 tablets as planned multiple studies.  If this is ineffective, may need study under anesthesia/twilight anesthesia that she can discuss with cardiology.

## 2023-04-21 ENCOUNTER — Other Ambulatory Visit: Payer: Self-pay | Admitting: Hematology & Oncology

## 2023-04-21 ENCOUNTER — Telehealth: Payer: Self-pay | Admitting: Internal Medicine

## 2023-04-21 DIAGNOSIS — D352 Benign neoplasm of pituitary gland: Secondary | ICD-10-CM

## 2023-04-21 DIAGNOSIS — R7989 Other specified abnormal findings of blood chemistry: Secondary | ICD-10-CM

## 2023-04-21 DIAGNOSIS — C772 Secondary and unspecified malignant neoplasm of intra-abdominal lymph nodes: Secondary | ICD-10-CM

## 2023-04-21 DIAGNOSIS — C7A1 Malignant poorly differentiated neuroendocrine tumors: Secondary | ICD-10-CM

## 2023-04-21 NOTE — Telephone Encounter (Signed)
Spoke to the patient on 04/13/2023 at 1645   Discussed elevated ACTH, I have recommended proceeding with 24-hour urinary cortisol   Reviewed technique and importance of keeping the sample cold  Patient will stop by the office Monday, July 1 at 2:30 PM to pick up urine drugs

## 2023-04-22 ENCOUNTER — Other Ambulatory Visit: Payer: Self-pay | Admitting: Behavioral Health

## 2023-04-22 DIAGNOSIS — F331 Major depressive disorder, recurrent, moderate: Secondary | ICD-10-CM

## 2023-04-22 DIAGNOSIS — F411 Generalized anxiety disorder: Secondary | ICD-10-CM

## 2023-04-24 ENCOUNTER — Other Ambulatory Visit: Payer: Medicare Other

## 2023-04-24 ENCOUNTER — Other Ambulatory Visit: Payer: Self-pay | Admitting: Family Medicine

## 2023-04-24 DIAGNOSIS — D352 Benign neoplasm of pituitary gland: Secondary | ICD-10-CM

## 2023-04-24 DIAGNOSIS — I10 Essential (primary) hypertension: Secondary | ICD-10-CM

## 2023-04-25 ENCOUNTER — Encounter: Payer: Self-pay | Admitting: Internal Medicine

## 2023-04-25 LAB — BASIC METABOLIC PANEL
BUN/Creatinine Ratio: 20 (ref 12–28)
BUN: 18 mg/dL (ref 8–27)
CO2: 23 mmol/L (ref 20–29)
Calcium: 9.3 mg/dL (ref 8.7–10.3)
Chloride: 100 mmol/L (ref 96–106)
Creatinine, Ser: 0.89 mg/dL (ref 0.57–1.00)
Glucose: 178 mg/dL — ABNORMAL HIGH (ref 70–99)
Potassium: 4.6 mmol/L (ref 3.5–5.2)
Sodium: 137 mmol/L (ref 134–144)
eGFR: 74 mL/min/{1.73_m2} (ref >59)

## 2023-04-25 LAB — FOLLICLE STIMULATING HORMONE: FSH: 18.6 m[IU]/mL — ABNORMAL LOW (ref 25.8–134.8)

## 2023-04-25 LAB — INSULIN-LIKE GROWTH FACTOR: Insulin-Like GF-1: 87 ng/mL (ref 60–207)

## 2023-04-25 LAB — ACTH: ACTH: 76.3 pg/mL — ABNORMAL HIGH (ref 7.2–63.3)

## 2023-04-25 LAB — PROLACTIN: Prolactin: 50.9 ng/mL — ABNORMAL HIGH (ref 3.6–25.2)

## 2023-04-25 LAB — CORTISOL: Cortisol: 9.9 ug/dL (ref 6.2–19.4)

## 2023-04-26 ENCOUNTER — Encounter: Payer: Self-pay | Admitting: Family Medicine

## 2023-04-28 ENCOUNTER — Other Ambulatory Visit (HOSPITAL_BASED_OUTPATIENT_CLINIC_OR_DEPARTMENT_OTHER): Payer: Self-pay

## 2023-04-28 ENCOUNTER — Other Ambulatory Visit: Payer: Self-pay | Admitting: Family Medicine

## 2023-04-28 ENCOUNTER — Other Ambulatory Visit: Payer: Medicare Other

## 2023-04-28 ENCOUNTER — Encounter: Payer: Self-pay | Admitting: Hematology & Oncology

## 2023-04-28 DIAGNOSIS — D352 Benign neoplasm of pituitary gland: Secondary | ICD-10-CM

## 2023-04-28 MED ORDER — PANTOPRAZOLE SODIUM 40 MG PO TBEC
40.0000 mg | DELAYED_RELEASE_TABLET | Freq: Every day | ORAL | 1 refills | Status: DC
  Filled 2023-04-28 (×2): qty 90, 90d supply, fill #0

## 2023-05-03 ENCOUNTER — Ambulatory Visit
Admission: RE | Admit: 2023-05-03 | Discharge: 2023-05-03 | Disposition: A | Discharge status: Home / Self Care | Payer: Medicare Other | Source: Ambulatory Visit | Attending: Family Medicine | Admitting: Family Medicine

## 2023-05-03 DIAGNOSIS — N6314 Unspecified lump in the right breast, lower inner quadrant: Secondary | ICD-10-CM

## 2023-05-06 ENCOUNTER — Other Ambulatory Visit: Payer: Self-pay | Admitting: Behavioral Health

## 2023-05-06 DIAGNOSIS — F5105 Insomnia due to other mental disorder: Secondary | ICD-10-CM

## 2023-05-06 LAB — CORTISOL, URINE, 24 HOUR
24 Hour urine volume (VMAHVA): 600 mL
CREATININE, URINE: 0.8 g/(24.h) (ref 0.50–2.15)
Cortisol (Ur), Free: 5.8 mcg/24 h (ref 4.0–50.0)

## 2023-05-10 ENCOUNTER — Telehealth (INDEPENDENT_AMBULATORY_CARE_PROVIDER_SITE_OTHER): Payer: Medicare Other | Admitting: Behavioral Health

## 2023-05-10 ENCOUNTER — Other Ambulatory Visit: Payer: Self-pay | Admitting: Behavioral Health

## 2023-05-10 ENCOUNTER — Encounter: Payer: Self-pay | Admitting: Behavioral Health

## 2023-05-10 DIAGNOSIS — F331 Major depressive disorder, recurrent, moderate: Secondary | ICD-10-CM | POA: Diagnosis not present

## 2023-05-10 DIAGNOSIS — F411 Generalized anxiety disorder: Secondary | ICD-10-CM

## 2023-05-10 NOTE — Progress Notes (Signed)
Wendy Barber [id number] 1961/01/18 62 y.o.  Virtual Visit via Video Note  I connected with pt @ on 05/10/23 at  1:30 PM EDT by a video enabled telemedicine application and verified that I am speaking with the correct person using two identifiers.   I discussed the limitations of evaluation and management by telemedicine and the availability of in person appointments. The patient expressed understanding and agreed to proceed.  I discussed the assessment and treatment plan with the patient. The patient was provided an opportunity to ask questions and all were answered. The patient agreed with the plan and demonstrated an understanding of the instructions.   The patient was advised to call back or seek an in-person evaluation if the symptoms worsen or if the condition fails to improve as anticipated.  I provided 30 minutes of non-face-to-face time during this encounter.  The patient was located at home.  The provider was located at [facility].   [name], NP   Subjective:   Patient ID:  Wendy Barber is a 62 y.o. (DOB [birth year]) female.  Chief Complaint:  Chief Complaint  Patient presents with   Anxiety   Depression   Follow-up   Medication Refill   Patient Education   Pain    HPI 62 year old female presents to this office  for follow up and medication management.   Still facing a multitude of health issues. Waiting on new MRI ON 7/22.  She is struggling with pain. Does not want to make any more medication changes right now. instructed due to fear of starting medical medication same time. She understands that depression related to chronic disease is very difficult to treat. Reports her anxiety today at 3/10 and depression at 4/10. She is sleeping 6 hours per night. Her family remains highly supportive. She denies any mania, no psychosis, No current SI or HI.       No prior psychiatric medications    Review of Systems:  Review of Systems  Constitutional:  Negative.   Allergic/Immunologic: Negative.   Neurological: Negative.   Psychiatric/Behavioral:  Positive for dysphoric mood. The patient is nervous/anxious.     Medications: I have reviewed the patient's current medications.  Current Outpatient Medications  Medication Sig Dispense Refill   amLODipine (NORVASC) 5 MG tablet TAKE 1 TABLET(5 MG) BY MOUTH DAILY 90 tablet 1   buPROPion (WELLBUTRIN XL) 300 MG 24 hr tablet TAKE 1 TABLET(300 MG) BY MOUTH DAILY (Patient not taking: Reported on 04/17/2023) 90 tablet 0   busPIRone (BUSPAR) 15 MG tablet Take 1 tablet (15 mg total) by mouth 3 (three) times daily. 270 tablet 3   citalopram (CELEXA) 40 MG tablet TAKE 1 TABLET(40 MG) BY MOUTH DAILY 30 tablet 0   cyanocobalamin 1000 MCG tablet Take 1 tablet (1,000 mcg total) by mouth daily.     Dextromethorphan-buPROPion ER (AUVELITY) 45-105 MG TBCR Take one tablet by mouth for 3 days, then take two tablet daily 7-8 hours between doses 60 tablet 1   diazepam (VALIUM) 5 MG tablet Take 1 tab 30 minutes prior to enclosed imaging study. 4 tablet 0   dicyclomine (BENTYL) 20 MG tablet TAKE 1 TABLET(20 MG) BY MOUTH THREE TIMES DAILY BEFORE MEALS (Patient taking differently: Take 20 mg by mouth 3 (three) times daily before meals.) 90 tablet 4   diphenoxylate-atropine (LOMOTIL) 2.5-0.025 MG tablet TAKE 2 TABLETS IN THE MORNING, AT NOON, IN THE EVENING AND AT BEDTIME Strength: 2.5-0.025 mg 240 tablet 3   eszopiclone (LUNESTA) 2  MG TABS tablet TAKE 1 TABLET(2 MG) BY MOUTH AT BEDTIME AS NEEDED FOR SLEEP 30 tablet 0   ferrous sulfate 325 (65 FE) MG tablet Take 1 tablet (325 mg total) by mouth daily. 30 tablet 3   fondaparinux (ARIXTRA) 10 MG/0.8ML SOLN injection Inject 0.8 mLs (10 mg total) into the skin daily. 72 mL 3   hydrocortisone (ANUSOL-HC) 2.5 % rectal cream Place 1 Application rectally 4 (four) times daily as needed for hemorrhoids or anal itching. 30 g 1   HYDROmorphone (DILAUDID) 4 MG tablet Take 1 tablet (4 mg  total) by mouth every 6 (six) hours as needed for severe pain. (Patient not taking: Reported on 04/17/2023) 30 tablet 0   loperamide (IMODIUM) 2 MG capsule Take 2 capsules (4 mg total) by mouth 4 (four) times daily as needed for diarrhea or loose stools. (Patient taking differently: Take 4 mg by mouth in the morning, at noon, in the evening, and at bedtime.)     meclizine (ANTIVERT) 25 MG tablet TAKE 1 TABLET(25 MG) BY MOUTH THREE TIMES DAILY AS NEEDED FOR DIZZINESS 60 tablet 0   nystatin-triamcinolone ointment (MYCOLOG) Apply 1 Application topically 2 (two) times daily. For up to 7 days and then STOP.  Repeat as needed for flares after at least a 2 week break 30 g 0   ondansetron (ZOFRAN) 8 MG tablet Take 8 mg by mouth See admin instructions. Am yes and 2 day if neneced     Opium 10 MG/ML (1%) TINC Take 0.6 mLs (6 mg total) by mouth every 3 (three) hours as needed for diarrhea or loose stools. 118 mL 0   orphenadrine (NORFLEX) 100 MG tablet TAKE 1 TABLET(100 MG) BY MOUTH AT BEDTIME AS NEEDED FOR MUSCLE SPASMS 30 tablet 2   pantoprazole (PROTONIX) 40 MG tablet Take 1 tablet (40 mg total) by mouth daily before breakfast. 90 tablet 1   potassium chloride (KLOR-CON M) 10 MEQ tablet Take 2 tablets (20 mEq total) by mouth daily. (Patient not taking: Reported on 04/17/2023) 60 tablet 1   pramoxine-hydrocortisone (PROCTOCREAM-HC) 1-1 % rectal cream Place 1 Application rectally 2 (two) times daily. (Patient taking differently: Place 1 Application rectally 2 (two) times daily as needed for hemorrhoids or anal itching (if hydrocortisone 2.5% cream is not available).) 30 g 3   promethazine (PHENERGAN) 12.5 MG tablet TAKE 1 TABLET(12.5 MG) BY MOUTH TWICE DAILY AS NEEDED 30 tablet 3   propranolol (INDERAL) 20 MG tablet TAKE 1 TABLET(20 MG) BY MOUTH TWICE DAILY 180 tablet 1   No current facility-administered medications for this visit.    Medication Side Effects: None  Allergies:  Allergies  Allergen Reactions    Penicillins Shortness Of Breath and Other (See Comments)    Irregular and rapid Heart Rate, too    Alprazolam Hives and Other (See Comments)    Hard to arouse, unresponsiveness also   Ativan [Lorazepam] Swelling and Other (See Comments)    Face & Throat Swelling  Note: tolerates midazolam fine   Corticosteroids Other (See Comments)    "Psychotic behavior"    Erythromycin Other (See Comments)    Severe stomach pain    Gabapentin Other (See Comments)    Made the patient feel depressed   Savella  [Milnacipran] Other (See Comments)    Reaction not noted   Prednisolone Anxiety   Prednisone Anxiety and Other (See Comments)    "Anxiety & Nervous Breakdown"    Past Medical History:  Diagnosis Date   Arthritis  Back pain    Cancer [facility])    pseudomyxoma peritonei   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   Chronic fatigue syndrome    Diabetes mellitus without complication (HCC)    DVT of deep femoral vein, left (HCC) 03/19/2020   Fibromyalgia    Goals of care, counseling/discussion 03/19/2020   Hypertension    Iron deficiency anemia due to chronic blood loss 05/14/2021   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   Pernicious anemia 05/14/2021   Presence of IVC filter 03/19/2020   Pulmonary embolism, bilateral (HCC) 03/19/2020   Short bowel syndrome, unspecified     Family History  Problem Relation Age of Onset   Depression Mother    Drug abuse Sister    Suicidality Sister    Alcohol abuse Brother    Drug abuse Brother     Social History   Socioeconomic History   Marital status: Married    Spouse name: Not on file   Number of children: 3   Years of education: Not on file   Highest education level: Some college, no degree  Occupational History   Occupation: disability  Tobacco Use   Smoking status: Never   Smokeless tobacco: Never  Vaping Use   Vaping status: Never Used  Substance and Sexual Activity   Alcohol  use: No   Drug use: Never   Sexual activity: Yes    Birth control/protection: None    Comment: hysterectomy  Other Topics Concern   Not on file  Social History Narrative   Right handed   One story home   Drinks occasional caffeine   Social Determinants of Health   Financial Resource Strain: Medium Risk (04/13/2023)   Overall Financial Resource Strain (CARDIA)    Difficulty of Paying Living Expenses: Somewhat hard  Food Insecurity: No Food Insecurity (04/13/2023)   Hunger Vital Sign    Worried About Running Out of Food in the Last Year: Never true    Ran Out of Food in the Last Year: Never true  Transportation Needs: No Transportation Needs (04/13/2023)   PRAPARE - Administrator, Civil Service (Medical): No    Lack of Transportation (Non-Medical): No  Physical Activity: Insufficiently Active (04/13/2023)   Exercise Vital Sign    Days of Exercise per Week: 1 day    Minutes of Exercise per Session: 20 min  Stress: Stress Concern Present (04/13/2023)   [employer] of Occupational Health - Occupational Stress Questionnaire    Feeling of Stress : Rather much  Social Connections: Moderately Integrated (04/13/2023)   Social Connection and Isolation Panel [NHANES]    Frequency of Communication with Friends and Family: Once a week    Frequency of Social Gatherings with Friends and Family: Once a week    Attends Religious Services: More than 4 times per year    Active Member of [employer] or Organizations: Yes    Attends Banker Meetings: More than 4 times per year    Marital Status: Married  Catering manager Violence: Not At Risk (03/09/2023)   Humiliation, Afraid, Rape, and Kick questionnaire    Fear of Current or Ex-Partner: No    Emotionally Abused: No    Physically Abused: No    Sexually Abused: No    Past Medical History, Surgical history, Social history, and Family history were reviewed and updated as appropriate.   Please see review of systems for  further details on the patient's review from today.   Objective:  Physical Exam:  There were no vitals taken for this visit.  Physical Exam Psychiatric:        Attention and Perception: Attention and perception normal.        Mood and Affect: Mood and affect normal.        Speech: Speech normal.        Behavior: Behavior normal. Behavior is cooperative.        Cognition and Memory: Cognition and memory normal.        Judgment: Judgment normal.     Lab Review:     Component Value Date/Time   NA 137 04/24/2023 1437   K 4.6 04/24/2023 1437   CL 100 04/24/2023 1437   CO2 23 04/24/2023 1437   GLUCOSE 178 (H) 04/24/2023 1437   GLUCOSE 154 (H) 03/15/2023 1544   BUN 18 04/24/2023 1437   CREATININE 0.89 04/24/2023 1437   CREATININE 1.07 (H) 03/15/2023 1544   CALCIUM 9.3 04/24/2023 1437   PROT 7.3 03/15/2023 1544   ALBUMIN 4.1 03/15/2023 1544   AST 23 03/15/2023 1544   ALT 26 03/15/2023 1544   ALKPHOS 142 (H) 03/15/2023 1544   BILITOT 0.4 03/15/2023 1544   GFRNONAA 59 (L) 03/15/2023 1544   GFRAA >60 07/27/2020 0922       Component Value Date/Time   WBC 7.8 03/15/2023 1544   WBC 9.3 12/09/2022 0200   RBC 3.93 03/15/2023 1544   HGB 12.1 03/15/2023 1544   HCT 37.6 03/15/2023 1544   PLT 203 03/15/2023 1544   MCV 95.7 03/15/2023 1544   MCH 30.8 03/15/2023 1544   MCHC 32.2 03/15/2023 1544   RDW 13.7 03/15/2023 1544   LYMPHSABS 3.6 03/15/2023 1544   MONOABS 0.7 03/15/2023 1544   EOSABS 0.2 03/15/2023 1544   BASOSABS 0.1 03/15/2023 1544    No results found for: "POCLITH", "LITHIUM"   No results found for: "PHENYTOIN", "PHENOBARB", "VALPROATE", "CBMZ"   .res Assessment: Plan:     Greater than 50% of 30 min visit with patient was spent on counseling and coordination of care. No changes since last visit. Increase abdominal pain. Pt laying in bed for appointment. Still waiting to get new MRI on 7/22.    We discussed her current level of stability with anxiety and  depression. We talked about her Chronic and serious health issues complicating tx options for depression.   Requesting no medication changes this visit. She also has started Opium Tinture for her Diarrhea.  We agreed to: Continue Wellbutrin to 150 mg XR daily for 5 days until on day four of Auvelity and then stop.   To continue  Auvelity, 45-90 mg daily for 3 days, then 90-210 mg daily.   Continue Buspar  to 15 mg to  three times daily as needed. Continue Celexa to 40 mg daily. Will take with food to help with nausea. Could make diarrhea worse in the beginning but may pass after a week or two.  To Continue Lunesta 5 mg at betime. Belsomra was not approved by insurance. She is happy with Alfonso Patten but says causes metallic taste in mouth next day.  Will report worsening symptoms or side effects promptly To follow up in 8  weeks to reassess Will continue to follow up with PCP regularly Provided emergency contact information Reviewed PDMP           Jalin was seen today for anxiety, depression, follow-up, medication refill, patient education and pain.  Diagnoses and all orders for this visit:  Major depressive  disorder, recurrent episode, moderate (HCC)  Generalized anxiety disorder     Please see After Visit Summary for patient specific instructions.  Future Appointments  Date Time Provider Department Center  05/15/2023 12:40 PM [facility] [place] MRI 1 [facility]  05/15/2023  1:30 PM [facility]  MRI 1 [facility]  04/16/2024 11:50 AM [name], [name], MD [facility] None    No orders of the defined types were placed in this encounter.     -------------------------------

## 2023-05-15 ENCOUNTER — Other Ambulatory Visit: Payer: Self-pay | Admitting: Family Medicine

## 2023-05-15 ENCOUNTER — Encounter: Payer: Self-pay | Admitting: Internal Medicine

## 2023-05-15 ENCOUNTER — Other Ambulatory Visit: Payer: Medicare Other

## 2023-05-15 ENCOUNTER — Inpatient Hospital Stay: Admission: RE | Admit: 2023-05-15 | Payer: Medicare Other | Source: Ambulatory Visit

## 2023-05-15 DIAGNOSIS — N641 Fat necrosis of breast: Secondary | ICD-10-CM

## 2023-05-19 ENCOUNTER — Encounter: Payer: Self-pay | Admitting: Family Medicine

## 2023-05-19 DIAGNOSIS — E119 Type 2 diabetes mellitus without complications: Secondary | ICD-10-CM

## 2023-05-21 ENCOUNTER — Other Ambulatory Visit: Payer: Self-pay | Admitting: Behavioral Health

## 2023-05-21 DIAGNOSIS — F331 Major depressive disorder, recurrent, moderate: Secondary | ICD-10-CM

## 2023-05-21 DIAGNOSIS — F411 Generalized anxiety disorder: Secondary | ICD-10-CM

## 2023-05-25 ENCOUNTER — Other Ambulatory Visit: Payer: Self-pay | Admitting: Hematology & Oncology

## 2023-05-25 ENCOUNTER — Other Ambulatory Visit: Payer: Self-pay

## 2023-05-25 ENCOUNTER — Other Ambulatory Visit: Payer: Self-pay | Admitting: *Deleted

## 2023-05-25 ENCOUNTER — Encounter: Payer: Self-pay | Admitting: Hematology & Oncology

## 2023-05-25 DIAGNOSIS — K591 Functional diarrhea: Secondary | ICD-10-CM

## 2023-05-25 DIAGNOSIS — H8112 Benign paroxysmal vertigo, left ear: Secondary | ICD-10-CM

## 2023-05-25 MED ORDER — DIPHENOXYLATE-ATROPINE 2.5-0.025 MG PO TABS
ORAL_TABLET | ORAL | 3 refills | Status: DC
Start: 2023-05-25 — End: 2023-05-25

## 2023-05-25 MED ORDER — DIPHENOXYLATE-ATROPINE 2.5-0.025 MG PO TABS
ORAL_TABLET | ORAL | 0 refills | Status: DC
Start: 2023-05-25 — End: 2023-05-25

## 2023-05-25 MED ORDER — DIPHENOXYLATE-ATROPINE 2.5-0.025 MG PO TABS: 2.0000 | ORAL_TABLET | Freq: Four times a day (QID) | ORAL | 0 refills | Status: DC | PRN

## 2023-05-26 ENCOUNTER — Other Ambulatory Visit: Payer: Self-pay | Admitting: *Deleted

## 2023-05-26 ENCOUNTER — Other Ambulatory Visit: Payer: Self-pay | Admitting: Behavioral Health

## 2023-05-26 DIAGNOSIS — Z85038 Personal history of other malignant neoplasm of large intestine: Secondary | ICD-10-CM

## 2023-05-26 DIAGNOSIS — A0472 Enterocolitis due to Clostridium difficile, not specified as recurrent: Secondary | ICD-10-CM

## 2023-05-26 DIAGNOSIS — F5105 Insomnia due to other mental disorder: Secondary | ICD-10-CM

## 2023-05-26 DIAGNOSIS — C772 Secondary and unspecified malignant neoplasm of intra-abdominal lymph nodes: Secondary | ICD-10-CM

## 2023-05-27 ENCOUNTER — Encounter: Payer: Self-pay | Admitting: Hematology & Oncology

## 2023-05-27 MED ORDER — DIPHENOXYLATE-ATROPINE 2.5-0.025 MG PO TABS
ORAL_TABLET | ORAL | 0 refills | Status: DC
Start: 2023-05-27 — End: 2023-08-19

## 2023-05-31 ENCOUNTER — Other Ambulatory Visit: Payer: Medicare Other

## 2023-05-31 ENCOUNTER — Inpatient Hospital Stay: Admission: RE | Admit: 2023-05-31 | Payer: Medicare Other | Source: Ambulatory Visit

## 2023-05-31 ENCOUNTER — Ambulatory Visit
Admission: RE | Admit: 2023-05-31 | Discharge: 2023-05-31 | Disposition: A | Discharge status: Home / Self Care | Payer: Medicare Other | Source: Ambulatory Visit | Attending: Hematology & Oncology | Admitting: Hematology & Oncology

## 2023-05-31 DIAGNOSIS — C181 Malignant neoplasm of appendix: Secondary | ICD-10-CM

## 2023-05-31 DIAGNOSIS — C7A1 Malignant poorly differentiated neuroendocrine tumors: Secondary | ICD-10-CM

## 2023-05-31 MED ORDER — GADOPICLENOL 0.5 MMOL/ML IV SOLN
10.0000 mL | Freq: Once | INTRAVENOUS | Status: AC | PRN
  Administered 2023-05-31: 10 mL via INTRAVENOUS

## 2023-06-01 ENCOUNTER — Encounter: Payer: Self-pay | Admitting: Family Medicine

## 2023-06-01 MED ORDER — DIAZEPAM 5 MG PO TABS: ORAL_TABLET | ORAL | 1 refills | Status: DC

## 2023-06-01 NOTE — Telephone Encounter (Signed)
Pt requesting refill due to having to reschedule head scan

## 2023-06-02 ENCOUNTER — Encounter: Payer: Self-pay | Admitting: Hematology & Oncology

## 2023-06-04 ENCOUNTER — Encounter: Payer: Self-pay | Admitting: Hematology & Oncology

## 2023-06-05 ENCOUNTER — Other Ambulatory Visit: Payer: Self-pay | Admitting: Hematology & Oncology

## 2023-06-05 ENCOUNTER — Ambulatory Visit
Admission: RE | Admit: 2023-06-05 | Discharge: 2023-06-05 | Disposition: A | Discharge status: Home / Self Care | Payer: Medicare Other | Source: Ambulatory Visit | Attending: Internal Medicine | Admitting: Internal Medicine

## 2023-06-05 DIAGNOSIS — C181 Malignant neoplasm of appendix: Secondary | ICD-10-CM

## 2023-06-05 DIAGNOSIS — D352 Benign neoplasm of pituitary gland: Secondary | ICD-10-CM

## 2023-06-05 MED ORDER — HYDROMORPHONE HCL 4 MG PO TABS: 4.0000 mg | ORAL_TABLET | Freq: Four times a day (QID) | ORAL | 0 refills | Status: DC | PRN

## 2023-06-05 MED ORDER — GADOPICLENOL 0.5 MMOL/ML IV SOLN
10.0000 mL | Freq: Once | INTRAVENOUS | Status: AC | PRN
  Administered 2023-06-05: 10 mL via INTRAVENOUS

## 2023-06-07 ENCOUNTER — Encounter: Payer: Self-pay | Admitting: General Practice

## 2023-06-07 ENCOUNTER — Encounter: Payer: Self-pay | Admitting: Family Medicine

## 2023-06-07 ENCOUNTER — Telehealth: Payer: Self-pay | Admitting: *Deleted

## 2023-06-07 NOTE — Telephone Encounter (Signed)
MyChart response sent to pt

## 2023-06-07 NOTE — Progress Notes (Signed)
Wendy Overlie, MD  Caroleen Hamman, NT PROCEDURE / BIOPSY REVIEW Date: 06/06/23  Requested Biopsy site: liver mass Reason for request: Needs diagnosis Imaging review: PET CT and MRI.  Hepatic dome lesion  Decision: Approved Imaging modality to perform: CT Schedule with: Moderate Sedation Schedule for: Any VIR  Additional comments: Schedule for CT but may want to use [place] at same time.  Please contact me with questions, concerns, or if issue pertaining to this request arise.  Arn Medal, MD [facility]       Previous Messages    ----- Message ----- From: Caroleen Hamman, NT Sent: 06/06/2023   4:08 PM EDT To: Ir Procedure Requests Subject: STAT CT Biopsy                                Procedure: STAT CT Biopsy   Reason: Mass in the liver.  Has history of appendiceal cancer and carcinoid.  I need to find out what this is. 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

## 2023-06-07 NOTE — Telephone Encounter (Signed)
FYI from pt husband her Liver scan did come back with abnormalities and wanted you to be aware they are working on biopsy

## 2023-06-08 ENCOUNTER — Encounter: Payer: Self-pay | Admitting: General Practice

## 2023-06-08 NOTE — Progress Notes (Signed)
Oley Balm, MD  Caroleen Hamman, NT OK TO SCHEDULE WITH OFF ANTICOAG X3D THX Ddh       Previous Messages    ----- Message ----- From: [name], MD Sent: 06/08/2023  12:54 PM EDT To: Oley Balm, MD Subject: RE: BX request and blood thinner hold request  Hold for 3 days.  Thanks!! Cindee Lame ----- Message ----- From: Oley Balm, MD Sent: 06/08/2023   9:20 AM EDT To: [name], MD; Caroleen Hamman, NT Subject: FW: BX request and blood thinner hold request  Hi [place] guidelines recommend holding Arixtra (fondaparinux) for 3 days before liver biopsy to minimize  bleeding risk. We don't want any bleed problems with this lady's biopsy.  If she is high clot risk being off this med x3d, do you want to bridge with lovenox? We would need to hold that 1 day pre procedure, then she could resume usual meds next day assuming all goes well.  Let me know your recommendations.  Thanks Reuel Boom ----- Message ----- From: Caroleen Hamman, NT Sent: 06/07/2023   4:48 PM EDT To: Oley Balm, MD; [name], MD Subject: BX request and blood thinner hold request      Called pt to schedule and was informed she has a clotting disorder and in the past 6 months had 2 clots in lung also has dvt's per pt. She is taking Arixtra injection once daily. Per the order it states she may hold 1 day prior, however our list has a 3 day hold procedure. Pt stated with her issue she is unsure if MD would approve for her to hold longer than 1 day.  Do we need to adjust anything with theses issues.  Thanks    Previous Messages    ----- Message ----- From: Richarda Overlie, MD Sent: 06/06/2023   5:02 PM EDT To: Caroleen Hamman, NT Subject: RE: STAT CT Biopsy                            PROCEDURE / BIOPSY REVIEW Date: 06/06/23  Requested Biopsy site: liver mass Reason for request: Needs diagnosis Imaging review: PET CT and MRI.  Hepatic dome lesion  Decision: Approved Imaging  modality to perform: CT Schedule with: Moderate Sedation Schedule for: Any VIR  Additional comments: Schedule for CT but may want to use [place] at same time.  Please contact me with questions, concerns, or if issue pertaining to this request arise.  Arn Medal, MD [facility] ----- Message ----- From: Caroleen Hamman, [place] Sent: 06/06/2023   4:08 PM EDT To: Ir Procedure Requests Subject: STAT CT Biopsy                                Procedure: STAT CT Biopsy   Reason: Mass in the liver.  Has history of appendiceal cancer and carcinoid.  I need to find out what this is. 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

## 2023-06-12 ENCOUNTER — Encounter: Payer: Self-pay | Admitting: Internal Medicine

## 2023-06-12 ENCOUNTER — Other Ambulatory Visit: Payer: Self-pay | Admitting: Student

## 2023-06-12 ENCOUNTER — Encounter: Payer: Self-pay | Admitting: General Practice

## 2023-06-12 DIAGNOSIS — D500 Iron deficiency anemia secondary to blood loss (chronic): Secondary | ICD-10-CM

## 2023-06-12 DIAGNOSIS — E1169 Type 2 diabetes mellitus with other specified complication: Secondary | ICD-10-CM

## 2023-06-12 NOTE — Progress Notes (Signed)
Allred, Rosalita Levan, PA-C  Richarda Overlie, MD; Caroleen Hamman, [place] Cc: Villa Herb, PA-C For Arixtra and high risk procedure such as liver bx we would routinely hold it for 3 days (if renal fxn ok) and make sure ordering MD and/or prescriber of Arixtra is aware of hold       Previous Messages    ----- Message ----- From: Richarda Overlie, MD Sent: 06/09/2023   5:05 PM EDT To: Rosalita Levan Allred, PA-C; * Subject: RE: STAT CT Biopsy                            The short answer is yes, we probably need to adjust something.  Unfortunately, I don't know enough about this medication.  I will ask our APPs to help [place] out.  Thanks, Ophelia Shoulder. Caryn Bee or Carollee Herter, can you answer this question about Arixtra and liver biopsies? ----- Message ----- From: Caroleen Hamman, NT Sent: 06/07/2023  12:10 PM EDT To: Richarda Overlie, MD Subject: RE: STAT CT Biopsy                            Called pt to schedule and was informed she has a clotting disorder and in the past 6 months had 2 clots in lung also has dvt's per pt. She is taking Arixtra injection once daily. Do we need to adjust anything with theses issues.  Thanks ----- Message ----- From: Richarda Overlie, MD Sent: 06/06/2023   5:02 PM EDT To: Caroleen Hamman, NT Subject: RE: STAT CT Biopsy                            PROCEDURE / BIOPSY REVIEW Date: 06/06/23  Requested Biopsy site: liver mass Reason for request: Needs diagnosis Imaging review: PET CT and MRI.  Hepatic dome lesion  Decision: Approved Imaging modality to perform: CT Schedule with: Moderate Sedation Schedule for: Any VIR  Additional comments: Schedule for CT but may want to use [place] at same time.  Please contact me with questions, concerns, or if issue pertaining to this request arise.  Arn Medal, MD [facility] ----- Message ----- From: Caroleen Hamman, [place] Sent: 06/06/2023   4:08 PM EDT To: Ir Procedure  Requests Subject: STAT CT Biopsy                                Procedure: STAT CT Biopsy   Reason: Mass in the liver.  Has history of appendiceal cancer and carcinoid.  I need to find out what this is. 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

## 2023-06-13 ENCOUNTER — Ambulatory Visit (HOSPITAL_COMMUNITY)
Admission: RE | Admit: 2023-06-13 | Discharge: 2023-06-13 | Disposition: A | Discharge status: Home / Self Care | Payer: Medicare Other | Source: Ambulatory Visit | Attending: Hematology & Oncology | Admitting: Hematology & Oncology

## 2023-06-13 ENCOUNTER — Other Ambulatory Visit: Payer: Self-pay

## 2023-06-13 DIAGNOSIS — K760 Fatty (change of) liver, not elsewhere classified: Secondary | ICD-10-CM | POA: Diagnosis not present

## 2023-06-13 DIAGNOSIS — E1169 Type 2 diabetes mellitus with other specified complication: Secondary | ICD-10-CM | POA: Insufficient documentation

## 2023-06-13 DIAGNOSIS — K738 Other chronic hepatitis, not elsewhere classified: Secondary | ICD-10-CM | POA: Insufficient documentation

## 2023-06-13 DIAGNOSIS — C772 Secondary and unspecified malignant neoplasm of intra-abdominal lymph nodes: Secondary | ICD-10-CM | POA: Insufficient documentation

## 2023-06-13 DIAGNOSIS — R160 Hepatomegaly, not elsewhere classified: Secondary | ICD-10-CM | POA: Diagnosis present

## 2023-06-13 DIAGNOSIS — C181 Malignant neoplasm of appendix: Secondary | ICD-10-CM | POA: Diagnosis present

## 2023-06-13 DIAGNOSIS — D500 Iron deficiency anemia secondary to blood loss (chronic): Secondary | ICD-10-CM | POA: Diagnosis not present

## 2023-06-13 DIAGNOSIS — K7460 Unspecified cirrhosis of liver: Secondary | ICD-10-CM | POA: Diagnosis not present

## 2023-06-13 LAB — CBC
HCT: 36 % (ref 36.0–46.0)
Hemoglobin: 11.7 g/dL — ABNORMAL LOW (ref 12.0–15.0)
MCH: 31.2 pg (ref 26.0–34.0)
MCHC: 32.5 g/dL (ref 30.0–36.0)
MCV: 96 fL (ref 80.0–100.0)
Platelets: 295 K/uL (ref 150–400)
RBC: 3.75 MIL/uL — ABNORMAL LOW (ref 3.87–5.11)
RDW: 13.9 % (ref 11.5–15.5)
WBC: 8.8 K/uL (ref 4.0–10.5)
nRBC: 0 % (ref 0.0–0.2)

## 2023-06-13 LAB — GLUCOSE, CAPILLARY: Glucose-Capillary: 139 mg/dL — ABNORMAL HIGH (ref 70–99)

## 2023-06-13 LAB — PROTIME-INR
INR: 1 (ref 0.8–1.2)
Prothrombin Time: 13.4 s (ref 11.4–15.2)

## 2023-06-13 MED ORDER — FENTANYL CITRATE (PF) 100 MCG/2ML IJ SOLN
INTRAMUSCULAR | Status: AC | PRN
  Administered 2023-06-13: 25 ug via INTRAVENOUS
  Administered 2023-06-13: 50 ug via INTRAVENOUS
  Administered 2023-06-13: 25 ug via INTRAVENOUS

## 2023-06-13 MED ORDER — MIDAZOLAM HCL 2 MG/2ML IJ SOLN
INTRAMUSCULAR | Status: AC
  Filled 2023-06-13: qty 4

## 2023-06-13 MED ORDER — GELATIN ABSORBABLE 12-7 MM EX MISC: 1.0000 | Freq: Once | CUTANEOUS | Status: DC

## 2023-06-13 MED ORDER — MIDAZOLAM HCL 2 MG/2ML IJ SOLN
INTRAMUSCULAR | Status: AC | PRN
  Administered 2023-06-13: .5 mg via INTRAVENOUS
  Administered 2023-06-13: 1 mg via INTRAVENOUS
  Administered 2023-06-13: .5 mg via INTRAVENOUS

## 2023-06-13 MED ORDER — HEPARIN SOD (PORK) LOCK FLUSH 100 UNIT/ML IV SOLN
500.0000 [IU] | Freq: Once | INTRAVENOUS | Status: AC
  Administered 2023-06-13: 500 [IU] via INTRAVENOUS
  Filled 2023-06-13: qty 5

## 2023-06-13 MED ORDER — SODIUM CHLORIDE 0.9 % IV SOLN: INTRAVENOUS | Status: DC

## 2023-06-13 MED ORDER — FENTANYL CITRATE (PF) 100 MCG/2ML IJ SOLN
INTRAMUSCULAR | Status: AC
  Filled 2023-06-13: qty 4

## 2023-06-13 MED ORDER — LIDOCAINE HCL 1 % IJ SOLN
10.0000 mL | Freq: Once | INTRAMUSCULAR | Status: AC
  Administered 2023-06-13: 10 mL via INTRADERMAL

## 2023-06-13 NOTE — Procedures (Addendum)
Interventional Radiology Procedure Note  Procedure: [place] & CT guided biopsy of liver mass  Findings: Could not visualize with CT or [place].  Needle placement was done with triangulating from the prior MRI.  Mx 18g core  Complications: None EBL: None  Recommendations: - Bedrest 2 hours.   - Routine wound care - Follow up pathology - Advance diet  - restart arixstra tomorrow on schedule  Signed,  Gilmer Mor, DO

## 2023-06-13 NOTE — H&P (Signed)
Chief Complaint: Patient was seen in consultation today for liver lesion biopsy at the request of Ennever,Peter R  Referring Physician(s): Ennever,Peter R  Supervising Physician: Gilmer Mor  Patient Status: [facility] - Out-pt  History of Present Illness: Wendy Barber is a 62 y.o. female   FULL Code status per pt Known appendiceal cancer 2005--- follows with Dr Rometta Emery in [place] Also followed here with Dr Myna Hidalgo Denies abd pain; N/V Has had daily diarrhea for few years   Follow up PET 03/2023:  IMPRESSION: 1. There are two medium-sized foci of increased radiotracer uptake identified within the dome of the right lobe of liver. Suspicious for liver metastasis. No corresponding abnormality noted on the unenhanced CT images. Consider multiphase, contrast enhanced liver MRI for anatomic correlation. 2. No additional sites of abnormal tracer uptake.  CT 05/31/23: IMPRESSION: 1. Very dense, masslike and retractile enhancement in the liver dome involving a large irregular area subtending hepatic segments VII and VIII measuring approximately 7.9 X 4.4 cm. This corresponds to abnormal PET avidity identified by prior PET-CT and is concerning for malignancy, although of uncertain nature. Appearance of this lesion is generally not typical for an isolated manifestation of metastatic appendiceal malignancy. Although PET Dotatate generally implies specificity for neuroendocrine tumors, case reports of Dotatate avid hepatocellular carcinoma exist, and tissue sampling may be helpful for definitive diagnosis given underlying stigmata of cirrhosis. 2. Cirrhosis, hepatomegaly, and steatosis. 3. Status post cholecystectomy.   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   Pernicious anemia 05/14/2021   Presence of IVC filter 03/19/2020   Pulmonary embolism, bilateral (HCC) 03/19/2020   Short bowel syndrome, unspecified     Past Surgical History:  Procedure Laterality Date   ABDOMINAL HYSTERECTOMY     ABDOMINAL SURGERY     ACHILLES TENDON REPAIR     APPENDECTOMY     ARTHROPLASTY     CARPAL TUNNEL RELEASE     CHOLECYSTECTOMY     IR CATHETER TUBE CHANGE  02/02/2021   IR CATHETER TUBE CHANGE  02/25/2021   IR IMAGING GUIDED PORT INSERTION  06/20/2022   IR RADIOLOGIST EVAL & MGMT  02/24/2021   IR RADIOLOGIST EVAL & MGMT  03/10/2021   IR RADIOLOGIST EVAL & MGMT  06/22/2022   IR THROMBECT VENO MECH MOD SED  06/20/2022   IR US GUIDE BX ASP/DRAIN  11/25/2019   IR US GUIDE VASC ACCESS LEFT  06/20/2022   IR US GUIDE VASC ACCESS RIGHT  06/20/2022   IR US GUIDE VASC ACCESS RIGHT  06/20/2022   IR VENO/EXT/BI  06/20/2022   IR VENOCAVAGRAM IVC  06/20/2022   JOINT REPLACEMENT     KNEE ARTHROSCOPY     ORIF ANKLE FRACTURE Right 08/27/2019   Procedure: OPEN REDUCTION INTERNAL FIXATION RIGHT ANKLE FRACTURE;  Surgeon: Sheral Apley, MD;  Location: [facility];  Service:  Orthopedics;  Laterality: Right;   perineorrophy     TONGUE BIOPSY      Allergies: Penicillins, Alprazolam, Ativan [lorazepam], Corticosteroids, Erythromycin, Gabapentin, Savella  [milnacipran], Prednisolone, and Prednisone  Medications: Prior to Admission medications   Medication Sig Start Date End Date Taking? Authorizing Provider  amLODipine (NORVASC) 5 MG tablet TAKE 1 TABLET(5 MG) BY MOUTH DAILY 04/24/23  Yes Shade Flood, MD   busPIRone (BUSPAR) 15 MG tablet TAKE 1 TABLET(15 MG) BY MOUTH THREE TIMES DAILY 05/11/23  Yes [name], NP  citalopram (CELEXA) 40 MG tablet TAKE 1 TABLET(40 MG) BY MOUTH DAILY 05/21/23  Yes [name], NP  cyanocobalamin 1000 MCG tablet Take 1 tablet (1,000 mcg total) by mouth daily. 07/26/22  Yes Lonia Blood, MD  Dextromethorphan-buPROPion ER (AUVELITY) 45-105 MG TBCR Take one tablet by mouth for 3 days, then take two tablet daily 7-8 hours between doses 03/07/23  Yes White, Brian A, NP  diazepam (VALIUM) 5 MG tablet Take 1 tab 30 minutes prior to enclosed imaging study. 06/01/23  Yes Shade Flood, MD  dicyclomine (BENTYL) 20 MG tablet TAKE 1 TABLET(20 MG) BY MOUTH THREE TIMES DAILY BEFORE MEALS Patient taking differently: Take 20 mg by mouth 3 (three) times daily before meals. 10/25/22  Yes [name], MD  diphenoxylate-atropine (LOMOTIL) 2.5-0.025 MG tablet Take two tablets by mouth 4 times a day as needed for loose stools or diarrhea. 05/27/23  Yes Ennever, Rose Phi, MD  eszopiclone (LUNESTA) 2 MG TABS tablet TAKE 1 TABLET(2 MG) BY MOUTH AT BEDTIME AS NEEDED FOR SLEEP. 05/29/23  Yes [name], NP  hydrocortisone (ANUSOL-HC) 2.5 % rectal cream Place 1 Application rectally 4 (four) times daily as needed for hemorrhoids or anal itching. 02/13/23  Yes Ennever, Rose Phi, MD  HYDROmorphone (DILAUDID) 4 MG tablet Take 1 tablet (4 mg total) by mouth every 6 (six) hours as needed for severe pain. 06/05/23  Yes Ennever, Rose Phi, MD  loperamide (IMODIUM) 2 MG capsule Take 2 capsules (4 mg total) by mouth 4 (four) times daily as needed for diarrhea or loose stools. Patient taking differently: Take 4 mg by mouth in the morning, at noon, in the evening, and at bedtime. 05/08/22  Yes Glade Lloyd, MD  meclizine (ANTIVERT) 25 MG tablet TAKE 1 TABLET(25 MG) BY MOUTH THREE TIMES DAILY AS NEEDED FOR DIZZINESS 04/07/23  Yes Ennever, Rose Phi, MD  nystatin-triamcinolone ointment (MYCOLOG) Apply 1  Application topically 2 (two) times daily. For up to 7 days and then STOP.  Repeat as needed for flares after at least a 2 week break 01/30/23  Yes [name], DO  ondansetron (ZOFRAN) 8 MG tablet Take 8 mg by mouth See admin instructions. Am yes and 2 day if neneced 03/03/21  Yes [provider]  Opium 10 MG/ML (1%) TINC Take 0.6 mLs (6 mg total) by mouth every 3 (three) hours as needed for diarrhea or loose stools. 03/15/23  Yes [name], MD  orphenadrine (NORFLEX) 100 MG tablet TAKE 1 TABLET(100 MG) BY MOUTH AT BEDTIME AS NEEDED FOR MUSCLE SPASMS 04/07/23  Yes [name], MD  pantoprazole (PROTONIX) 40 MG tablet Take 1 tablet (40 mg total) by mouth daily before breakfast. 04/28/23  Yes Shade Flood, MD  pramoxine-hydrocortisone (PROCTOCREAM-HC) 1-1 % rectal cream Place 1 Application rectally 2 (two) times daily. Patient taking differently: Place 1 Application rectally 2 (two) times daily as needed for hemorrhoids or anal itching (if hydrocortisone  2.5% cream is not available). 07/04/22  Yes [name], MD  promethazine (PHENERGAN) 12.5 MG tablet TAKE 1 TABLET(12.5 MG) BY MOUTH TWICE DAILY AS NEEDED 03/24/23  Yes Shade Flood, MD  propranolol (INDERAL) 20 MG tablet TAKE 1 TABLET(20 MG) BY MOUTH TWICE DAILY 02/06/23  Yes Shade Flood, MD  buPROPion (WELLBUTRIN XL) 300 MG 24 hr tablet TAKE 1 TABLET(300 MG) BY MOUTH DAILY Patient not taking: Reported on 04/17/2023 11/28/22   [name], NP  ferrous sulfate 325 (65 FE) MG tablet Take 1 tablet (325 mg total) by mouth daily. 06/03/22 06/03/23  [name], Pratik D, DO  fondaparinux (ARIXTRA) 10 MG/0.8ML SOLN injection Inject 0.8 mLs (10 mg total) into the skin daily. 03/15/23   [name], MD  potassium chloride (KLOR-CON M) 10 MEQ tablet Take 2 tablets (20 mEq total) by mouth daily. Patient not taking: Reported on 04/17/2023 07/07/22   Shade Flood, MD     Family History  Problem Relation Age of Onset    Depression Mother    Drug abuse Sister    Suicidality Sister    Alcohol abuse Brother    Drug abuse Brother     Social History   Socioeconomic History   Marital status: Married    Spouse name: Not on file   Number of children: 3   Years of education: Not on file   Highest education level: Some college, no degree  Occupational History   Occupation: disability  Tobacco Use   Smoking status: Never   Smokeless tobacco: Never  Vaping Use   Vaping status: Never Used  Substance and Sexual Activity   Alcohol use: No   Drug use: Never   Sexual activity: Yes    Birth control/protection: None    Comment: hysterectomy  Other Topics Concern   Not on file  Social History Narrative   Right handed   One story home   Drinks occasional caffeine   Social Determinants of Health   Financial Resource Strain: Medium Risk (04/13/2023)   Overall Financial Resource Strain (CARDIA)    Difficulty of Paying Living Expenses: Somewhat hard  Food Insecurity: No Food Insecurity (04/13/2023)   Hunger Vital Sign    Worried About Running Out of Food in the Last Year: Never true    Ran Out of Food in the Last Year: Never true  Transportation Needs: No Transportation Needs (04/13/2023)   PRAPARE - Administrator, Civil Service (Medical): No    Lack of Transportation (Non-Medical): No  Physical Activity: Insufficiently Active (04/13/2023)   Exercise Vital Sign    Days of Exercise per Week: 1 day    Minutes of Exercise per Session: 20 min  Stress: Stress Concern Present (04/13/2023)   [employer] of Occupational Health - Occupational Stress Questionnaire    Feeling of Stress : Rather much  Social Connections: Moderately Integrated (04/13/2023)   Social Connection and Isolation Panel [NHANES]    Frequency of Communication with Friends and Family: Once a week    Frequency of Social Gatherings with Friends and Family: Once a week    Attends Religious Services: More than 4 times per year     Active Member of [employer] or Organizations: Yes    Attends Engineer, structural: More than 4 times per year    Marital Status: Married    Review of Systems: A 12 point ROS discussed and pertinent positives are indicated in the HPI above.  All other systems  are negative.  Review of Systems  Constitutional:  Negative for activity change, fatigue and fever.  Respiratory:  Negative for cough and shortness of breath.   Cardiovascular:  Negative for chest pain.  Gastrointestinal:  Positive for diarrhea. Negative for abdominal pain.  Psychiatric/Behavioral:  Negative for behavioral problems and confusion.     Vital Signs: BP (!) 157/84   Pulse 67   Temp 98.3 F (36.8 C) (Temporal)   Resp 18   Ht 5\' 6"  (1.676 m)   Wt 245 lb (111.1 kg)   SpO2 94%   BMI 39.54 kg/m     Physical Exam Vitals reviewed.  HENT:     Mouth/Throat:     Mouth: Mucous membranes are moist.  Cardiovascular:     Rate and Rhythm: Normal rate and regular rhythm.     Heart sounds: Normal heart sounds.  Pulmonary:     Effort: Pulmonary effort is normal.     Breath sounds: Normal breath sounds.  Abdominal:     Palpations: Abdomen is soft.     Tenderness: There is no abdominal tenderness.  Musculoskeletal:        General: Normal range of motion.  Skin:    General: Skin is warm.  Neurological:     Mental Status: She is alert and oriented to person, place, and time.  Psychiatric:        Behavior: Behavior normal.     Imaging: MR LIVER W WO CONTRAST  Result Date: 06/03/2023 CLINICAL DATA:  Metastatic appendiceal carcinoid, liver lesions identified by PET-CT EXAM: MRI ABDOMEN WITHOUT AND WITH CONTRAST TECHNIQUE: Multiplanar multisequence MR imaging of the abdomen was performed both before and after the administration of intravenous contrast. CONTRAST:  7 mL Vueway gadolinium contrast IV COMPARISON:  PET-CT, 04/11/2023 FINDINGS: Lower chest: No acute abnormality. Hepatobiliary: Coarse contour of the  liver. Hepatomegaly, maximum coronal span 21.4 cm. Marked hepatic steatosis. Diffuse, heterogeneous and reticular contrast enhancement throughout the liver. Very dense, masslike and retractile enhancement in the liver dome involving a large irregular area subtending hepatic segments VII and VIII measuring approximately 7.9 X 4.4 cm (series 13, image 20). Status post cholecystectomy. No biliary ductal dilatation. Pancreas: Unremarkable. No pancreatic ductal dilatation or surrounding inflammatory changes. Spleen: Normal in size without significant abnormality. Adrenals/Urinary Tract: Adrenal glands are unremarkable. Kidneys are normal, without renal calculi, solid lesion, or hydronephrosis. Stomach/Bowel: Stomach is within normal limits. No evidence of bowel wall thickening, distention, or inflammatory changes. Vascular/Lymphatic: No significant vascular findings are present. No enlarged abdominal lymph nodes. Other: No abdominal wall hernia or abnormality. No ascites. Musculoskeletal: No acute or significant osseous findings. IMPRESSION: 1. Very dense, masslike and retractile enhancement in the liver dome involving a large irregular area subtending hepatic segments VII and VIII measuring approximately 7.9 X 4.4 cm. This corresponds to abnormal PET avidity identified by prior PET-CT and is concerning for malignancy, although of uncertain nature. Appearance of this lesion is generally not typical for an isolated manifestation of metastatic appendiceal malignancy. Although PET Dotatate generally implies specificity for neuroendocrine tumors, case reports of Dotatate avid hepatocellular carcinoma exist, and tissue sampling may be helpful for definitive diagnosis given underlying stigmata of cirrhosis. 2. Cirrhosis, hepatomegaly, and steatosis. 3. Status post cholecystectomy. Electronically Signed   By: Jearld Lesch M.D.   On: 06/03/2023 15:19    Labs:  CBC: Recent Labs    01/18/23 1337 02/06/23 1450  03/15/23 1544 06/13/23 0843  WBC 8.2 7.9 7.8 8.8  HGB 12.9 12.4 12.1 11.7*  HCT 39.3 39.1 37.6 36.0  PLT 277 186 203 295    COAGS: Recent Labs    06/13/22 1841 07/22/22 1834 12/07/22 1610  INR 1.3* 1.0 1.0  APTT 32  --   --     BMP: Recent Labs    12/19/22 1510 01/18/23 1337 02/06/23 1450 03/15/23 1544 04/17/23 1217 04/24/23 1437  NA 140 138 140 139 140 137  K 4.1 3.8 3.8 4.3 4.6 4.6  CL 103 103 103 101 106 100  CO2 28 26 30 26 20 23   GLUCOSE 175* 160* 147* 154* 140* 178*  BUN 23* 22* 18 24* 19 18  CALCIUM 9.5 9.2 9.0 9.8 9.3 9.3  CREATININE 1.10* 1.07* 1.11* 1.07* 1.03* 0.89  GFRNONAA 58* 59* 57* 59*  --   --     LIVER FUNCTION TESTS: Recent Labs    12/19/22 1510 01/18/23 1337 02/06/23 1450 03/15/23 1544  BILITOT 0.4 0.4 0.4 0.4  AST 19 22 23 23   ALT 21 26 32 26  ALKPHOS 164* 189* 165* 142*  PROT 7.1 7.1 7.2 7.3  ALBUMIN 4.0 4.0 3.8 4.1    TUMOR MARKERS: Recent Labs    03/15/23 1544  CEA 1.66    Assessment and Plan:  Liver lesion biopsy Risks and benefits of liver lesion biopsy was discussed with the patient and/or patient's family including, but not limited to bleeding, infection, damage to adjacent structures or low yield requiring additional tests.  All of the questions were answered and there is agreement to proceed.  Consent signed and in chart.  Thank you for this interesting consult.  I greatly enjoyed meeting Hortencia Peeler and look forward to participating in their care.  A copy of this report was sent to the requesting provider on this date.  Electronically Signed: Robet Leu, PA-C 06/13/2023, 9:13 AM   I spent a total of  30 Minutes   in face to face in clinical consultation, greater than 50% of which was counseling/coordinating care for liver lesion biopsy

## 2023-06-15 ENCOUNTER — Other Ambulatory Visit: Payer: Self-pay

## 2023-06-15 ENCOUNTER — Other Ambulatory Visit (HOSPITAL_COMMUNITY): Payer: Self-pay

## 2023-06-15 ENCOUNTER — Encounter: Payer: Self-pay | Admitting: Hematology & Oncology

## 2023-06-16 NOTE — Telephone Encounter (Signed)
Could you please advise in Dr. Lonzo Cloud absence?

## 2023-06-19 ENCOUNTER — Other Ambulatory Visit (HOSPITAL_COMMUNITY): Payer: Self-pay

## 2023-06-19 MED ORDER — CABERGOLINE 0.5 MG PO TABS: 0.5000 mg | ORAL_TABLET | ORAL | 2 refills | Status: DC

## 2023-06-20 ENCOUNTER — Other Ambulatory Visit: Payer: Self-pay | Admitting: Behavioral Health

## 2023-06-20 ENCOUNTER — Other Ambulatory Visit: Payer: Self-pay | Admitting: Hematology & Oncology

## 2023-06-20 DIAGNOSIS — H8112 Benign paroxysmal vertigo, left ear: Secondary | ICD-10-CM

## 2023-06-20 DIAGNOSIS — F5105 Insomnia due to other mental disorder: Secondary | ICD-10-CM

## 2023-06-20 NOTE — Telephone Encounter (Signed)
Can we please get this patient scheduled for a VV with Dr. Lonzo Cloud to discuss her MRI.

## 2023-06-21 ENCOUNTER — Encounter: Payer: Self-pay | Admitting: Hematology & Oncology

## 2023-06-21 LAB — SURGICAL PATHOLOGY

## 2023-06-21 NOTE — Progress Notes (Signed)
Virtual Visit via Video Note  I connected with Wendy Barber on 06/23/23  at 11:50 AM  by a video enabled telemedicine application and verified that I am speaking with the correct person using two identifiers.   I discussed the limitations of evaluation and management by telemedicine and the availability of in person appointments. The patient expressed understanding and agreed to proceed.  -Location of the patient : home -Location of the provider : office  -The names of all persons participating in the telemedicine service : Pt , spouse and myself       Name: Wendy Barber  MRN/ DOB: [id number], 01/31/1961    Age/ Sex: 62 y.o., female    PCP: Ermelinda Das, MD   Reason for Endocrinology Evaluation: Pituitary adenoma     Date of Initial Endocrinology Evaluation: 04/17/2023    HPI: Wendy Barber is a 62 y.o. female with a past medical history of HTN, CAD, Hx DVT/PE, Cancer of appendic with mets , autoimmune hepatitis, cirrhosis and DM. The patient presented for initial endocrinology clinic visit on 04/17/2023 for consultative assistance with her pituitary adenoma.   During evaluation by neurology for vertigo and cerebral meningioma she was noted to have a pituitary adenoma on brain MRI 2023    Patient follows with oncology for history of metastatic appendiceal cancer, s/p surgery 2022    Patient had an incidental finding of thyroid nodule on CT scan which prompted thyroid ultrasound 11/2022, revealing a right inferior 2.3 cm nodule meeting follow-up criteria.  She has diarrhea, creon was recommended by surgeon      She has occasional headaches which have worsened over the past year Has worsening  nausea with headaches Has noted decrease in Vision, last eye exam ~ 2023  S/P hysterectomy 2004 , no HRT   Pituitary labs showed elevated ACTH at 76.3 pg/mL ( 7.2-63.3) , 24 hr urine cortisol was normal , she also had elevated Prolactin 49.9 ng/dL ),   IGF -1 was normal as  well as TFT's.   SUBJECTIVE:    Today (06/23/23):  Wendy Barber is here for follow-up on pituitary macroadenoma.  Patient continues with chronic diarrhea which has been there for the past approximately 2 years She has an ophthalmology referral, she is scheduled 07/11/2023 with Dr. Vanessa Barber She continues with headaches, they are not debilitating but this has been unusual for her She has noted visual changes as well No galactorrhea  She recently had hepatic lesion biopsy which did not show malignant cells, but due to high suspicion for a neuroendocrine tumor, she will have a repeat biopsy     HISTORY:  Past Medical History:  Past Medical History:  Diagnosis Date   Arthritis    Back pain    Cancer (HCC)    pseudomyxoma peritonei   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   Chronic fatigue syndrome    Diabetes mellitus without complication (HCC)    DVT of deep femoral vein, left (HCC) 03/19/2020   Fibromyalgia    Goals of care, counseling/discussion 03/19/2020   Hypertension    Iron deficiency anemia due to chronic blood loss 05/14/2021   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   Pernicious anemia 05/14/2021   Presence of IVC filter 03/19/2020   Pulmonary embolism, bilateral (HCC) 03/19/2020   Short bowel syndrome, unspecified    Past Surgical History:  Past Surgical History:  Procedure Laterality Date   ABDOMINAL HYSTERECTOMY     ABDOMINAL SURGERY  ACHILLES TENDON REPAIR     APPENDECTOMY     ARTHROPLASTY     CARPAL TUNNEL RELEASE     CHOLECYSTECTOMY     IR CATHETER TUBE CHANGE  02/02/2021   IR CATHETER TUBE CHANGE  02/25/2021   IR IMAGING GUIDED PORT INSERTION  06/20/2022   IR RADIOLOGIST EVAL & MGMT  02/24/2021   IR RADIOLOGIST EVAL & MGMT  03/10/2021   IR RADIOLOGIST EVAL & MGMT  06/22/2022   IR THROMBECT VENO MECH MOD SED  06/20/2022   IR US GUIDE BX ASP/DRAIN  11/25/2019   IR US GUIDE VASC ACCESS LEFT  06/20/2022   IR US  GUIDE VASC ACCESS RIGHT  06/20/2022   IR US GUIDE VASC ACCESS RIGHT  06/20/2022   IR VENO/EXT/BI  06/20/2022   IR VENOCAVAGRAM IVC  06/20/2022   JOINT REPLACEMENT     KNEE ARTHROSCOPY     ORIF ANKLE FRACTURE Right 08/27/2019   Procedure: OPEN REDUCTION INTERNAL FIXATION RIGHT ANKLE FRACTURE;  Surgeon: Sheral Apley, MD;  Location: [facility];  Service: Orthopedics;  Laterality: Right;   perineorrophy     TONGUE BIOPSY      Social History:  reports that she has never smoked. She has never used smokeless tobacco. She reports that she does not drink alcohol and does not use drugs. Family History: family history includes Alcohol abuse in her brother; Depression in her mother; Drug abuse in her brother and sister; Suicidality in her sister.   HOME MEDICATIONS: Allergies as of 06/23/2023       Reactions   Penicillins Shortness Of Breath, Other (See Comments)   Irregular and rapid Heart Rate, too   Alprazolam Hives, Other (See Comments)   Hard to arouse, unresponsiveness also   Ativan [lorazepam] Swelling, Other (See Comments)   Face & Throat Swelling  Note: tolerates midazolam fine   Corticosteroids Other (See Comments)   "Psychotic behavior"   Erythromycin Other (See Comments)   Severe stomach pain   Gabapentin Other (See Comments)   Made the patient feel depressed   Savella  [milnacipran] Other (See Comments)   Reaction not noted   Prednisolone Anxiety   Prednisone Anxiety, Other (See Comments)   "Anxiety & Nervous Breakdown"        Medication List        Accurate as of June 23, 2023 11:52 AM. If you have any questions, ask your nurse or doctor.          amLODipine 5 MG tablet Commonly known as: NORVASC TAKE 1 TABLET(5 MG) BY MOUTH DAILY   Auvelity 45-105 MG Tbcr Generic drug: Dextromethorphan-buPROPion ER Take one tablet by mouth for 3 days, then take two tablet daily 7-8 hours between doses   buPROPion 300 MG 24 hr tablet Commonly known as: WELLBUTRIN XL TAKE 1  TABLET(300 MG) BY MOUTH DAILY   busPIRone 15 MG tablet Commonly known as: BUSPAR TAKE 1 TABLET(15 MG) BY MOUTH THREE TIMES DAILY   cabergoline 0.5 MG tablet Commonly known as: DOSTINEX Take 1 tablet (0.5 mg total) by mouth 2 (two) times a week.   citalopram 40 MG tablet Commonly known as: CELEXA TAKE 1 TABLET(40 MG) BY MOUTH DAILY   cyanocobalamin 1000 MCG tablet Take 1 tablet (1,000 mcg total) by mouth daily.   diazepam 5 MG tablet Commonly known as: VALIUM Take 1 tab 30 minutes prior to enclosed imaging study.   dicyclomine 20 MG tablet Commonly known as: BENTYL TAKE 1 TABLET(20 MG) BY MOUTH THREE  TIMES DAILY BEFORE MEALS What changed: See the new instructions.   diphenoxylate-atropine 2.5-0.025 MG tablet Commonly known as: LOMOTIL Take two tablets by mouth 4 times a day as needed for loose stools or diarrhea.   eszopiclone 2 MG Tabs tablet Commonly known as: LUNESTA TAKE 1 TABLET(2 MG) BY MOUTH AT BEDTIME AS NEEDED FOR SLEEP Start taking on: June 26, 2023   ferrous sulfate 325 (65 FE) MG tablet Take 1 tablet (325 mg total) by mouth daily.   fondaparinux 10 MG/0.8ML Soln injection Commonly known as: Arixtra Inject 0.8 mLs (10 mg total) into the skin daily.   hydrocortisone 2.5 % rectal cream Commonly known as: ANUSOL-HC Place 1 Application rectally 4 (four) times daily as needed for hemorrhoids or anal itching.   HYDROmorphone 4 MG tablet Commonly known as: Dilaudid Take 1 tablet (4 mg total) by mouth every 6 (six) hours as needed for severe pain.   loperamide 2 MG capsule Commonly known as: IMODIUM Take 2 capsules (4 mg total) by mouth 4 (four) times daily as needed for diarrhea or loose stools. What changed: when to take this   meclizine 25 MG tablet Commonly known as: ANTIVERT TAKE 1 TABLET(25 MG) BY MOUTH THREE TIMES DAILY AS NEEDED FOR DIZZINESS   nystatin-triamcinolone ointment Commonly known as: MYCOLOG Apply 1 Application topically 2 (two)  times daily. For up to 7 days and then STOP.  Repeat as needed for flares after at least a 2 week break   ondansetron 8 MG tablet Commonly known as: ZOFRAN Take 8 mg by mouth See admin instructions. Am yes and 2 day if neneced   Opium 10 MG/ML (1%) Tinc Take 0.6 mLs (6 mg total) by mouth every 3 (three) hours as needed for diarrhea or loose stools.   orphenadrine 100 MG tablet Commonly known as: NORFLEX TAKE 1 TABLET(100 MG) BY MOUTH AT BEDTIME AS NEEDED FOR MUSCLE SPASMS   pantoprazole 40 MG tablet Commonly known as: PROTONIX Take 1 tablet (40 mg total) by mouth daily before breakfast.   potassium chloride 10 MEQ tablet Commonly known as: KLOR-CON M Take 2 tablets (20 mEq total) by mouth daily.   pramoxine-hydrocortisone 1-1 % rectal cream Commonly known as: PROCTOCREAM-HC Place 1 Application rectally 2 (two) times daily. What changed:  when to take this reasons to take this   promethazine 12.5 MG tablet Commonly known as: PHENERGAN TAKE 1 TABLET(12.5 MG) BY MOUTH TWICE DAILY AS NEEDED   propranolol 20 MG tablet Commonly known as: INDERAL TAKE 1 TABLET(20 MG) BY MOUTH TWICE DAILY          REVIEW OF SYSTEMS: A comprehensive ROS was conducted with the patient and is negative except as per HPI     OBJECTIVE:  VS: There were no vitals taken for this visit.   Wt Readings from Last 3 Encounters:  06/13/23 245 lb (111.1 kg)  04/17/23 252 lb 9.6 oz (114.6 kg)  04/17/23 254 lb (115.2 kg)     EXAM: General: Pt appears well and is in NAD  Neck: General: Supple without adenopathy. Thyroid: Thyroid size normal.  No goiter or nodules appreciated.  Lungs: Clear with good BS bilat   Heart: Auscultation: RRR.  Abdomen: Soft, nontender  Extremities:  BL LE: No pretibial edema   Mental Status: Judgment, insight: Intact Orientation: Oriented to time, place, and person Mood and affect: No depression, anxiety, or agitation     DATA REVIEWED:   Latest Reference  Range & Units 04/17/23 12:17  Sodium  134 - 144 mmol/L 140  Potassium 3.5 - 5.2 mmol/L 4.6  Chloride 96 - 106 mmol/L 106  CO2 20 - 29 mmol/L 20  Glucose 70 - 99 mg/dL 960 (H)  BUN 8 - 27 mg/dL 19  Creatinine 4.54 - 0.98 mg/dL 1.19 (H)  Calcium 8.7 - 10.3 mg/dL 9.3  BUN/Creatinine Ratio 12 - 28  18  eGFR >59 mL/min/1.73 62     Latest Reference Range & Units 04/17/23 12:17  Insulin-Like GF-1 60 - 207 ng/mL 92  FSH 25.8 - 134.8 mIU/mL 21.8 (L)  Prolactin 3.6 - 25.2 ng/mL 49.9 (H)  Glucose 70 - 99 mg/dL 147 (H)  (L): Data is abnormally low (H): Data is abnormally high     Thyroid Ultrasound 12/08/2022    Estimated total number of nodules >/= 1 cm: 1   Number of spongiform nodules >/=  2 cm not described below (TR1): 0   Number of mixed cystic and solid nodules >/= 1.5 cm not described below (TR2): 0   _________________________________________________________   Nodule # 1:   Location: Right; Inferior   Maximum size: 2.3 cm; Other 2 dimensions: 1.5 x 2.3 cm   Composition: solid/almost completely solid (2)   Echogenicity: isoechoic (1)   Shape: not taller-than-wide (0)   Margins: ill-defined (0)   Echogenic foci: none (0)   ACR TI-RADS total points: 3.   ACR TI-RADS risk category: TR3 (3 points).   ACR TI-RADS recommendations:   *Given size (>/= 1.5 - 2.4 cm) and appearance, a follow-up ultrasound in 1 year should be considered based on TI-RADS criteria.   _________________________________________________________   IMPRESSION: The nodule seen on CT imaging corresponds with a 2.3 cm ill-defined TI-RADS category 3 nodule versus pseudo nodule in the right lower gland. This lesion meets criteria for imaging surveillance. Recommend follow-up ultrasound in 1 years.   MRI Brain 06/05/2023   FINDINGS: Brain: Left eccentric solid and cystic pituitary mass measuring 11 mm craniocaudal on T2 weighted imaging. No suprasellar extension or cavernous sinus invasion.  Normal appearing pituitary gland displaced into the right sella. In retrospect this was present on prior with similar dimensions by coronal T2 weighted imaging.   Meningioma measuring 19 mm near the torcula with evidence of invasion into the straight sinus.   No acute or subacute infarct, hemorrhage, hydrocephalus, or collection.   Vascular: Major flow voids and vascular enhancements are preserved. See comments about straight sinus above.   Skull and upper cervical spine: Normal marrow signal.   Sinuses/Orbits: Negative.   IMPRESSION: 1. 11 mm pituitary adenoma in the left eccentric sella. No invasion or mass effect on adjacent structures. 2. Unchanged 19 mm meningioma near the torcula with straight sinus invasion.     ASSESSMENT/PLAN/RECOMMENDATIONS:   Pituitary Macroadenoma:    -Pituitary MRI showed 11 mm pituitary adenoma, no invasion or mass effect on adjacent structures -Unchanged 19 mm meningioma with straight sinus invasion -Patient with headaches over the past few months -She has a pending appointment with Dr. Vanessa Barber (ophthalmology) next month -Will repeat MRI of the brain in 6 months  2.  Hyperprolactinemia:  -I have recommended starting cabergoline, cautioned against GI side effects -It is difficult to ascertain if this is due to prolactinoma or stalk effect, will repeat prolactin in 3 months  Medication Start cabergoline 0.5 mg, half a tablet twice weekly  3. Elevated ACTH:  -This has been slightly elevated but trending down -24-hour urinary cortisol has come back normal -Suspect this elevation is stress related,  will continue to monitor   4.  Right thyroid nodule:  -No local neck symptoms -TFTs are normal -She will be due for repeat ultrasound by February 2025  Follow-up in 6 months  Signed electronically by: Lyndle Herrlich, MD  [facility]  [facility] [address] [place] [place], [place]  [address] Phone: [phone] FAX: [phone]   CC: Ermelinda Das, MD [address] [place] [address] [place] [address] Phone: [phone] Fax: [phone]   Return to Endocrinology clinic as below: Future Appointments  Date Time Provider Department Center  07/05/2023  1:15 PM Rennis Chris, MD [facility] None  07/11/2023 11:00 AM [name], NP [facility] None  08/04/2023 12:30 PM [facility] [place] [facility]

## 2023-06-21 NOTE — Telephone Encounter (Signed)
Due 9/2

## 2023-06-22 NOTE — Telephone Encounter (Signed)
Patient called in for refill on Lunesta 2mg . States she took the last one last night and she is now out. Ph: [phone] Appt 9/17 Pharmacy [facility] [address] [place]

## 2023-06-22 NOTE — Telephone Encounter (Signed)
Patient last filled 8/5. Notified her.

## 2023-06-23 ENCOUNTER — Telehealth: Payer: Self-pay | Admitting: Internal Medicine

## 2023-06-23 ENCOUNTER — Telehealth: Payer: Medicare Other | Admitting: Internal Medicine

## 2023-06-23 DIAGNOSIS — R7989 Other specified abnormal findings of blood chemistry: Secondary | ICD-10-CM

## 2023-06-23 DIAGNOSIS — D352 Benign neoplasm of pituitary gland: Secondary | ICD-10-CM | POA: Diagnosis not present

## 2023-06-23 DIAGNOSIS — E221 Hyperprolactinemia: Secondary | ICD-10-CM | POA: Insufficient documentation

## 2023-06-23 MED ORDER — CABERGOLINE 0.5 MG PO TABS: 0.2500 mg | ORAL_TABLET | ORAL | 2 refills | Status: DC

## 2023-06-23 NOTE — Telephone Encounter (Signed)
Please contact the pt and schedule her for a lab only appointment in 3 months and OV with me in 6 months     Thanks

## 2023-06-23 NOTE — Telephone Encounter (Signed)
Addressed on pharmacy refill request.

## 2023-06-26 ENCOUNTER — Encounter: Payer: Self-pay | Admitting: Hematology & Oncology

## 2023-06-28 ENCOUNTER — Encounter: Payer: Self-pay | Admitting: Hematology & Oncology

## 2023-06-28 ENCOUNTER — Encounter: Payer: Self-pay | Admitting: Family Medicine

## 2023-06-28 NOTE — Progress Notes (Signed)
[facility] - Clinic Note  07/05/2023   CHIEF COMPLAINT Patient presents for Retina Evaluation  HISTORY OF PRESENT ILLNESS: Wendy Barber is a 62 y.o. female who presents to the clinic today for:  HPI     Retina Evaluation   In both eyes.  This started 8 months ago.  Duration of 8 months.  I, the attending physician,  performed the HPI with the patient and updated documentation appropriately.        Comments   Patient here for Retina Evaluation. Referred by Dr Neva Seat for DM evaluation. Patient states vision is awful. Can't hardly see anything. Hx of wearing contact lenses and started wearing glasses at age 4. Started 8 - 10 months ago when wakes up seeing colors like hot pink and lime green. At first was squiggle lines.  Has a lot of health issues. Appendix cancer liver cancer, pituitary tumor and angionoma. Hx of diabetes. Not taking any medication for DM.      Last edited by Rennis Chris, MD on 07/07/2023  1:33 AM.    Pt is here on the referral of her PCP, Dr. Neva Seat, she states she was dx with diabetes in 2019, pts husband states after her last sx she has is no longer insulin diabetic, pts husband states she has appendix cancer, liver cancer, a pituitary tumor and angionoma, he states her vision is rapidly decreasing and they are trying to figure out why, pt states she started wearing glasses when she was 9, pt states she has a history of multiple corneal ulcers, pts A1c was 6.4 in February, she states she wakes up and sees colors, she says instead of her walls being grey, they are lime green or fuchsia    Referring physician: Ermelinda Das, MD [address] [place],  [place] [address]  HISTORICAL INFORMATION:  Selected notes from the MEDICAL RECORD NUMBER Referred by Dr. Neva Seat (PCP) for diabetic eye exam LEE:  Ocular Hx- PMH-   CURRENT MEDICATIONS: No current outpatient medications on file. (Ophthalmic Drugs)   No current  facility-administered medications for this visit. (Ophthalmic Drugs)   Current Outpatient Medications (Other)  Medication Sig   amLODipine (NORVASC) 5 MG tablet TAKE 1 TABLET(5 MG) BY MOUTH DAILY   busPIRone (BUSPAR) 15 MG tablet TAKE 1 TABLET(15 MG) BY MOUTH THREE TIMES DAILY   cabergoline (DOSTINEX) 0.5 MG tablet Take 0.5 tablets (0.25 mg total) by mouth 2 (two) times a week.   citalopram (CELEXA) 40 MG tablet TAKE 1 TABLET(40 MG) BY MOUTH DAILY   dicyclomine (BENTYL) 20 MG tablet TAKE 1 TABLET(20 MG) BY MOUTH THREE TIMES DAILY BEFORE MEALS (Patient taking differently: Take 20 mg by mouth 3 (three) times daily before meals.)   diphenoxylate-atropine (LOMOTIL) 2.5-0.025 MG tablet Take two tablets by mouth 4 times a day as needed for loose stools or diarrhea.   eszopiclone (LUNESTA) 2 MG TABS tablet TAKE 1 TABLET(2 MG) BY MOUTH AT BEDTIME AS NEEDED FOR SLEEP   fondaparinux (ARIXTRA) 10 MG/0.8ML SOLN injection Inject 0.8 mLs (10 mg total) into the skin daily.   hydrocortisone (ANUSOL-HC) 2.5 % rectal cream Place 1 Application rectally 4 (four) times daily as needed for hemorrhoids or anal itching.   HYDROmorphone (DILAUDID) 4 MG tablet Take 1 tablet (4 mg total) by mouth every 6 (six) hours as needed for severe pain.   loperamide (IMODIUM) 2 MG capsule Take 2 capsules (4 mg total) by mouth 4 (four) times daily as needed  for diarrhea or loose stools. (Patient taking differently: Take 4 mg by mouth in the morning, at noon, in the evening, and at bedtime.)   meclizine (ANTIVERT) 25 MG tablet TAKE 1 TABLET(25 MG) BY MOUTH THREE TIMES DAILY AS NEEDED FOR DIZZINESS   nystatin-triamcinolone ointment (MYCOLOG) Apply 1 Application topically 2 (two) times daily. For up to 7 days and then STOP.  Repeat as needed for flares after at least a 2 week break   ondansetron (ZOFRAN) 8 MG tablet Take 8 mg by mouth See admin instructions. Am yes and 2 day if neneced   Opium 10 MG/ML (1%) TINC Take 0.6 mLs (6 mg total)  by mouth every 3 (three) hours as needed for diarrhea or loose stools.   orphenadrine (NORFLEX) 100 MG tablet TAKE 1 TABLET(100 MG) BY MOUTH AT BEDTIME AS NEEDED FOR MUSCLE SPASMS   pantoprazole (PROTONIX) 40 MG tablet Take 1 tablet (40 mg total) by mouth daily before breakfast.   pramoxine-hydrocortisone (PROCTOCREAM-HC) 1-1 % rectal cream Place 1 Application rectally 2 (two) times daily. (Patient taking differently: Place 1 Application rectally 2 (two) times daily as needed for hemorrhoids or anal itching (if hydrocortisone 2.5% cream is not available).)   promethazine (PHENERGAN) 12.5 MG tablet TAKE 1 TABLET(12.5 MG) BY MOUTH TWICE DAILY AS NEEDED   propranolol (INDERAL) 20 MG tablet TAKE 1 TABLET(20 MG) BY MOUTH TWICE DAILY   buPROPion (WELLBUTRIN XL) 300 MG 24 hr tablet TAKE 1 TABLET(300 MG) BY MOUTH DAILY (Patient not taking: Reported on 04/17/2023)   cyanocobalamin 1000 MCG tablet Take 1 tablet (1,000 mcg total) by mouth daily. (Patient not taking: Reported on 06/23/2023)   Dextromethorphan-buPROPion ER (AUVELITY) 45-105 MG TBCR Take one tablet by mouth for 3 days, then take two tablet daily 7-8 hours between doses (Patient not taking: Reported on 06/23/2023)   diazepam (VALIUM) 5 MG tablet Take 1 tab 30 minutes prior to enclosed imaging study. (Patient not taking: Reported on 06/23/2023)   ferrous sulfate 325 (65 FE) MG tablet Take 1 tablet (325 mg total) by mouth daily.   potassium chloride (KLOR-CON M) 10 MEQ tablet Take 2 tablets (20 mEq total) by mouth daily. (Patient not taking: Reported on 04/17/2023)   No current facility-administered medications for this visit. (Other)   REVIEW OF SYSTEMS: ROS   Positive for: Neurological, Endocrine, Eyes Last edited by Laddie Aquas, COA on 07/05/2023  1:33 PM.     ALLERGIES Allergies  Allergen Reactions   Penicillins Shortness Of Breath and Other (See Comments)    Irregular and rapid Heart Rate, too    Alprazolam Hives and Other (See  Comments)    Hard to arouse, unresponsiveness also   Ativan [Lorazepam] Swelling and Other (See Comments)    Face & Throat Swelling  Note: tolerates midazolam fine   Corticosteroids Other (See Comments)    "Psychotic behavior"    Erythromycin Other (See Comments)    Severe stomach pain    Gabapentin Other (See Comments)    Made the patient feel depressed   Savella  [Milnacipran] Other (See Comments)    Reaction not noted   Prednisolone Anxiety   Prednisone Anxiety and Other (See Comments)    "Anxiety & Nervous Breakdown"   PAST MEDICAL HISTORY Past Medical History:  Diagnosis Date   Arthritis    Back pain    Cancer [facility])    pseudomyxoma peritonei   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   Chronic fatigue syndrome  Diabetes mellitus without complication (HCC)    DVT of deep femoral vein, left (HCC) 03/19/2020   Fibromyalgia    Goals of care, counseling/discussion 03/19/2020   Hypertension    Iron deficiency anemia due to chronic blood loss 05/14/2021   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   Pernicious anemia 05/14/2021   Presence of IVC filter 03/19/2020   Pulmonary embolism, bilateral (HCC) 03/19/2020   Short bowel syndrome, unspecified    Past Surgical History:  Procedure Laterality Date   ABDOMINAL HYSTERECTOMY     ABDOMINAL SURGERY     ACHILLES TENDON REPAIR     APPENDECTOMY     ARTHROPLASTY     CARPAL TUNNEL RELEASE     CHOLECYSTECTOMY     IR CATHETER TUBE CHANGE  02/02/2021   IR CATHETER TUBE CHANGE  02/25/2021   IR IMAGING GUIDED PORT INSERTION  06/20/2022   IR RADIOLOGIST EVAL & MGMT  02/24/2021   IR RADIOLOGIST EVAL & MGMT  03/10/2021   IR RADIOLOGIST EVAL & MGMT  06/22/2022   IR THROMBECT VENO MECH MOD SED  06/20/2022   IR US GUIDE BX ASP/DRAIN  11/25/2019   IR US GUIDE VASC ACCESS LEFT  06/20/2022   IR US GUIDE VASC ACCESS RIGHT  06/20/2022   IR US GUIDE VASC ACCESS RIGHT  06/20/2022   IR VENO/EXT/BI   06/20/2022   IR VENOCAVAGRAM IVC  06/20/2022   JOINT REPLACEMENT     KNEE ARTHROSCOPY     ORIF ANKLE FRACTURE Right 08/27/2019   Procedure: OPEN REDUCTION INTERNAL FIXATION RIGHT ANKLE FRACTURE;  Surgeon: Sheral Apley, MD;  Location: [facility];  Service: Orthopedics;  Laterality: Right;   perineorrophy     TONGUE BIOPSY     FAMILY HISTORY Family History  Problem Relation Age of Onset   Depression Mother    Drug abuse Sister    Suicidality Sister    Alcohol abuse Brother    Drug abuse Brother    SOCIAL HISTORY Social History   Tobacco Use   Smoking status: Never   Smokeless tobacco: Never  Vaping Use   Vaping status: Never Used  Substance Use Topics   Alcohol use: No   Drug use: Never       OPHTHALMIC EXAM:  Base Eye Exam     Visual Acuity (Snellen - Linear)       Right Left   Dist cc 20/150 20/150 +1   Dist ph cc 20/100 -2 20/80 -1    Correction: Glasses         Tonometry (Tonopen, 1:26 PM)       Right Left   Pressure 11 11         Pupils       Dark Light Shape React APD   Right 3 2 Round Brisk None   Left 3 2 Round Brisk None         Visual Fields (Counting fingers)       Left Right    Full Full         Extraocular Movement       Right Left    Full, Ortho Full, Ortho         Neuro/Psych     Oriented x3: Yes   Mood/Affect: Normal         Dilation     Both eyes: 1.0% Mydriacyl, 2.5% Phenylephrine @ 1:26 PM           Slit Lamp and Fundus Exam  Slit Lamp Exam       Right Left   Lids/Lashes Dermatochalasis - upper lid Dermatochalasis - upper lid   Conjunctiva/Sclera White and quiet White and quiet   Cornea Scattered sub epi scars and haze greatest superior 2 sub epi scars and haze superiorly, trace PEE   Anterior Chamber deep and clear deep and clear   Iris Round and dilated, No NVI Round and dilated, No NVI   Lens 2+ Nuclear sclerosis, 2+ Cortical cataract 2+ Nuclear sclerosis, 2+ Cortical cataract   Anterior  Vitreous Vitreous syneresis mild syneresis         Fundus Exam       Right Left   Disc Pink and Sharp, Compact, temporal PPA, no pallor or edema Pink and Sharp, Compact, temporal PPA, no pallor or edema   C/D Ratio 0.3 0.3   Macula Flat, Blunted foveal reflex, RPE mottling, No heme or edema Flat, good foveal reflex, RPE mottling, No heme or edema   Vessels mild attenuation, mild tortuosity mild attenuation, mild tortuosity   Periphery Attached, mild peripheral cystoid degeneration inferiorly Attached, mild peripheral cystoid degeneration inferiorly           Refraction     Wearing Rx       Sphere Cylinder Axis Add   Right -6.75 +1.25 072 +2.25   Left -6.00 +0.50 097 +2.25         Manifest Refraction       Sphere Cylinder Axis Dist VA   Right -6.25 +1.25 075 20/80   Left -5.75 +1.00 100 20/80-1           IMAGING AND PROCEDURES  Imaging and Procedures for 07/05/2023  OCT, Retina - OU - Both Eyes       Right Eye Quality was good. Central Foveal Thickness: 239. Progression has no prior data. Findings include normal foveal contour, no IRF, no SRF, vitreomacular adhesion .   Left Eye Quality was good. Central Foveal Thickness: 240. Progression has no prior data. Findings include normal foveal contour, no IRF, no SRF.   Notes *Images captured and stored on drive  Diagnosis / Impression:  NFP, no IRF/SRF OU No DME  Clinical management:  See below  Abbreviations: NFP - Normal foveal profile. CME - cystoid macular edema. PED - pigment epithelial detachment. IRF - intraretinal fluid. SRF - subretinal fluid. EZ - ellipsoid zone. ERM - epiretinal membrane. ORA - outer retinal atrophy. ORT - outer retinal tubulation. SRHM - subretinal hyper-reflective material. IRHM - intraretinal hyper-reflective material           ASSESSMENT/PLAN:   ICD-10-CM   1. Diabetes mellitus type 2 without retinopathy (HCC)  E11.9 OCT, Retina - OU - Both Eyes    2. Decreased vision  in both eyes  H54.3     3. Essential hypertension  I10     4. Hypertensive retinopathy of both eyes  H35.033     5. Combined forms of age-related cataract of both eyes  H25.813      Diabetes mellitus, type 2 without retinopathy  - A1c: 6.4 on 02.14.24 - The incidence, risk factors for progression, natural history and treatment options for diabetic retinopathy  were discussed with patient.   - The need for close monitoring of blood glucose, blood pressure, and serum lipids, avoiding cigarette or any type of tobacco, and the need for long term follow up was also discussed with patient. - f/u in 1 year, sooner prn  2. 8-10 mo history of  decreased vision  - pt with complex medical history that includes DM, HTN, CAD, hx of DVT/PE, cancer of the appendix, cerebral meningioma  - on MRI brain done in 2023 and pituitary macroadenoma was found  - pt's main complaint is 8-10 mo history of decreased vision and visual disturbances -- reports some loss of color vision, reports waking up seeing random bright colors in her visual field  - BCVA 20/80 OU  - dilated exam shows moderate cataracts but no other findings to explain pt's symptoms  - recommend further evaluation with Neuro-Ophthalmology given unexplained vision changes in the setting of cerebral meningioma and pituitary macroadenoma  3,4. Hypertensive retinopathy OU - discussed importance of tight BP control - monitor  5. Mixed Cataract OU - The symptoms of cataract, surgical options, and treatments and risks were discussed with patient. - discussed diagnosis and progression - monitor   Ophthalmic Meds Ordered this visit:  No orders of the defined types were placed in this encounter.    Return in about 1 year (around 07/04/2024) for f/u DM exam, DFE, OCT.  There are no Patient Instructions on file for this visit.  Explained the diagnoses, plan, and follow up with the patient and they expressed understanding.  Patient expressed  understanding of the importance of proper follow up care.   This document serves as a record of services personally performed by Karie Chimera, MD, PhD. It was created on their behalf by De Blanch, an ophthalmic technician. The creation of this record is the provider's dictation and/or activities during the visit.    Electronically signed by: De Blanch, OA, 07/07/23  1:35 AM  This document serves as a record of services personally performed by Karie Chimera, MD, PhD. It was created on their behalf by Glee Arvin. Manson Passey, OA an ophthalmic technician. The creation of this record is the provider's dictation and/or activities during the visit.    Electronically signed by: Glee Arvin. Manson Passey, OA 07/07/23 1:35 AM  Karie Chimera, M.D., Ph.D. Diseases & Surgery of the Retina and Vitreous [facility] 07/05/2023  I have reviewed the above documentation for accuracy and completeness, and I agree with the above. Karie Chimera, M.D., Ph.D. 07/07/23 1:48 AM   Abbreviations: M myopia (nearsighted); A astigmatism; H hyperopia (farsighted); P presbyopia; Mrx spectacle prescription;  CTL contact lenses; OD right eye; OS left eye; OU both eyes  XT exotropia; ET esotropia; PEK punctate epithelial keratitis; PEE punctate epithelial erosions; DES dry eye syndrome; MGD meibomian gland dysfunction; ATs artificial tears; PFAT's preservative free artificial tears; NSC nuclear sclerotic cataract; PSC posterior subcapsular cataract; ERM epi-retinal membrane; PVD posterior vitreous detachment; RD retinal detachment; DM diabetes mellitus; DR diabetic retinopathy; NPDR non-proliferative diabetic retinopathy; PDR proliferative diabetic retinopathy; CSME clinically significant macular edema; DME diabetic macular edema; dbh dot blot hemorrhages; CWS cotton wool spot; POAG primary open angle glaucoma; C/D cup-to-disc ratio; HVF humphrey visual field; GVF goldmann visual field; OCT optical  coherence tomography; IOP intraocular pressure; BRVO Branch retinal vein occlusion; CRVO central retinal vein occlusion; CRAO central retinal artery occlusion; BRAO branch retinal artery occlusion; RT retinal tear; SB scleral buckle; PPV pars plana vitrectomy; VH Vitreous hemorrhage; PRP panretinal laser photocoagulation; IVK intravitreal kenalog; VMT vitreomacular traction; MH Macular hole;  NVD neovascularization of the disc; NVE neovascularization elsewhere; AREDS age related eye disease study; ARMD age related macular degeneration; POAG primary open angle glaucoma; EBMD epithelial/anterior basement membrane dystrophy; ACIOL anterior chamber intraocular lens; IOL intraocular lens;  PCIOL posterior chamber intraocular lens; Phaco/IOL phacoemulsification with intraocular lens placement; PRK photorefractive keratectomy; LASIK laser assisted in situ keratomileusis; HTN hypertension; DM diabetes mellitus; COPD chronic obstructive pulmonary disease

## 2023-06-29 NOTE — Telephone Encounter (Signed)
That should not be a problem at all.  Will need to schedule a visit, and a virtual visit is fine so we can go through the details and that can be documented as a face-to-face encounter for that referral.  Please call to schedule.  I will let patient know this information as well.  Thanks

## 2023-06-29 NOTE — Telephone Encounter (Signed)
Pt has a video visit scheduled

## 2023-06-30 ENCOUNTER — Other Ambulatory Visit: Payer: Self-pay | Admitting: Hematology & Oncology

## 2023-07-05 ENCOUNTER — Ambulatory Visit (INDEPENDENT_AMBULATORY_CARE_PROVIDER_SITE_OTHER): Payer: Medicare Other | Admitting: Ophthalmology

## 2023-07-05 ENCOUNTER — Encounter (INDEPENDENT_AMBULATORY_CARE_PROVIDER_SITE_OTHER): Payer: Self-pay | Admitting: Ophthalmology

## 2023-07-05 DIAGNOSIS — I10 Essential (primary) hypertension: Secondary | ICD-10-CM

## 2023-07-05 DIAGNOSIS — H543 Unqualified visual loss, both eyes: Secondary | ICD-10-CM

## 2023-07-05 DIAGNOSIS — E119 Type 2 diabetes mellitus without complications: Secondary | ICD-10-CM | POA: Diagnosis not present

## 2023-07-05 DIAGNOSIS — H3581 Retinal edema: Secondary | ICD-10-CM

## 2023-07-05 DIAGNOSIS — H35033 Hypertensive retinopathy, bilateral: Secondary | ICD-10-CM | POA: Diagnosis not present

## 2023-07-05 DIAGNOSIS — H25813 Combined forms of age-related cataract, bilateral: Secondary | ICD-10-CM

## 2023-07-05 LAB — HM DIABETES EYE EXAM

## 2023-07-06 ENCOUNTER — Encounter: Payer: Self-pay | Admitting: Hematology & Oncology

## 2023-07-07 ENCOUNTER — Encounter (INDEPENDENT_AMBULATORY_CARE_PROVIDER_SITE_OTHER): Payer: Self-pay | Admitting: Ophthalmology

## 2023-07-10 ENCOUNTER — Telehealth (INDEPENDENT_AMBULATORY_CARE_PROVIDER_SITE_OTHER): Payer: Medicare Other | Admitting: Family Medicine

## 2023-07-10 ENCOUNTER — Encounter: Payer: Self-pay | Admitting: Family Medicine

## 2023-07-10 DIAGNOSIS — C181 Malignant neoplasm of appendix: Secondary | ICD-10-CM

## 2023-07-10 DIAGNOSIS — R413 Other amnesia: Secondary | ICD-10-CM

## 2023-07-10 DIAGNOSIS — R197 Diarrhea, unspecified: Secondary | ICD-10-CM | POA: Diagnosis not present

## 2023-07-10 DIAGNOSIS — R2681 Unsteadiness on feet: Secondary | ICD-10-CM

## 2023-07-10 DIAGNOSIS — R5383 Other fatigue: Secondary | ICD-10-CM | POA: Diagnosis not present

## 2023-07-10 NOTE — Patient Instructions (Addendum)
Call Dr. Everlena Cooper to follow up on memory changes. I did order a few blood tests initially for fatigue as well - those can be drawn at Dr. Gustavo Lah office or at [facility] location below.  Barrier cream like Desitin may be helpful to protect perianal skin. I will order home health for PT, OT, nurse eval.  Recheck in 2 weeks. Return to the clinic or go to the nearest emergency room if any of your symptoms worsen or new symptoms occur.  [facility] or xray: Walk in 8:30-4:30 during weekdays, no appointment needed [address]  [place], [place] [address]

## 2023-07-10 NOTE — Progress Notes (Signed)
Virtual Visit via Video Note  I connected with Wendy Barber on 07/10/23 at 12:17 PM by a video enabled telemedicine application and verified that I am speaking with the correct person using two identifiers.  Patient location: home - with husband, consent given to discuss PHI.  My location: office - [place].    I discussed the limitations, risks, security and privacy concerns of performing an evaluation and management service by telephone and the availability of in person appointments. I also discussed with the patient that there may be a patient responsible charge related to this service. The patient expressed understanding and agreed to proceed, consent obtained  Chief complaint:  Chief Complaint  Patient presents with   home health    History of Present Illness: Wendy Barber is a 62 y.o. female  Visit to discuss home health care. Visit with patient and spouse. Would like to have home health eval for nursing care, PT/OT, some mobility issues, issues with gait and getting around home. With recurrent diarrhea, has some skin irritation in perianal area.  Has cream from dermatologist for rash under breast or abdominal skin.  Some difficulty getting the right words at times, forgetful at times.  Hx of meningioma.  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  Liver biopsy with chronic hepatitis pattern portal inflammation and cirrhosis but negative for malignancy on 06/13/2023. Prior imaging concerning for carcinoid process and metastasis. Plan for possible injection to treat for possible metastasis.  Dr. Floreen Comber - doctor in [place] not thinking this was cancer.  Dr. Myna Hidalgo treating locally. Has appt in 2 days - planned labs at that time.  Gets dizzy easily, feels generally weak - no acute changes in strength.  Has walker if needed at home.  More diarrhea - thinks over past year.  Metabolic encephalopathy in 07/2022 at time of UTI. No urinary  difficulties. No fever.  Thought process is off at times past few months. Trouble finding right words to say. Feels worse past few months. Forgets things at times, grocery list.  Prior low B12 - no recent supplement.  Has neurologist - Dr. Everlena Cooper, with hx of meningioma - no recent visit.  Lab Results  Component Value Date   VITAMINB12 546 03/15/2023        Patient Active Problem List   Diagnosis Date Noted   Hyperprolactinemia (HCC) 06/23/2023   Pituitary macroadenoma (HCC) 06/23/2023   Pituitary adenoma (HCC) 04/17/2023   Pulmonary emboli (HCC) 12/08/2022   Pulmonary embolism (HCC) 12/07/2022   Thyroid nodule 12/07/2022   Enteritis 07/23/2022   Fall at home, initial encounter 07/23/2022   AMS (altered mental status) 07/23/2022   Hemorrhoids 06/25/2022   Occasional tremors: Essential tremors 06/23/2022   Hypomagnesemia 06/22/2022   Iron deficiency anemia    Gastritis 06/18/2022   Depression 06/18/2022   UTI (urinary tract infection) 06/16/2022   DVT, bilateral lower limbs (HCC) 06/14/2022   Symptomatic bradycardia 06/13/2022   History of excision of intestinal structure 06/13/2022   CAD (coronary artery disease) 06/13/2022   Acute lower GI bleeding 05/04/2022   AKI (acute kidney injury) (HCC) 05/04/2022   History of deep vein thrombosis (DVT) of lower extremity 05/04/2022   History of pulmonary embolus (PE) 05/04/2022   Anxiety 05/04/2022   Pernicious anemia 05/14/2021   Iron deficiency anemia due to chronic blood loss 05/14/2021   Abdominal wall abscess    Surgical wound infection 01/16/2021   C. 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   DVT of deep femoral vein, left (HCC) 03/19/2020   Pulmonary embolism, bilateral (HCC) 03/19/2020    Presence of IVC filter 03/19/2020   Hepatic encephalopathy (HCC) 11/22/2019   Confusion 11/21/2019   Autoimmune hepatitis (HCC) 11/21/2019   Uncontrolled hypertension 11/21/2019   DM2 (diabetes mellitus, type 2) (HCC) 11/21/2019   Closed right ankle fracture 08/27/2019   Acute deep vein thrombosis (DVT) of femoral vein of left lower extremity (HCC) 08/03/2018   History of colon cancer 08/03/2018   History of partial colectomy 08/03/2018   Spinal stenosis 08/03/2018   Morbid obesity (HCC) 08/03/2018   Cerebral meningioma (HCC) 01/05/2018   Arthritis of right shoulder region 02/18/2017   History of renal calculi 02/14/2016   Midline low back pain 02/14/2016   Class 3 obesity (HCC) 01/07/2016   Migraine with aura and without status migrainosus, not intractable 01/07/2016   Chronic fatigue syndrome 09/23/2013   GERD (gastroesophageal reflux disease) 09/23/2013   History of hepatitis A 09/23/2013   Rheumatoid arthritis involving multiple sites (HCC) 09/23/2013   Past Medical History:  Diagnosis Date   Arthritis    Back pain    Cancer (HCC)    pseudomyxoma peritonei   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   Chronic fatigue syndrome    Diabetes mellitus without complication (HCC)    DVT of deep femoral vein, left (HCC) 03/19/2020   Fibromyalgia    Goals of care, counseling/discussion 03/19/2020   Hypertension    Iron deficiency anemia due to chronic blood loss 05/14/2021   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   Pernicious anemia 05/14/2021   Presence of IVC filter 03/19/2020   Pulmonary embolism, bilateral (HCC) 03/19/2020   Short bowel syndrome, unspecified    Past Surgical History:  Procedure Laterality Date   ABDOMINAL HYSTERECTOMY     ABDOMINAL SURGERY     ACHILLES TENDON REPAIR     APPENDECTOMY     ARTHROPLASTY     CARPAL TUNNEL RELEASE     CHOLECYSTECTOMY     IR CATHETER TUBE CHANGE  02/02/2021   IR CATHETER TUBE  CHANGE  02/25/2021   IR IMAGING GUIDED PORT INSERTION  06/20/2022   IR RADIOLOGIST EVAL & MGMT  02/24/2021   IR RADIOLOGIST EVAL & MGMT  03/10/2021   IR RADIOLOGIST EVAL & MGMT  06/22/2022   IR THROMBECT VENO MECH MOD SED  06/20/2022   IR US GUIDE BX ASP/DRAIN  11/25/2019   IR US GUIDE VASC ACCESS LEFT  06/20/2022   IR US GUIDE VASC ACCESS RIGHT  06/20/2022   IR US GUIDE VASC ACCESS RIGHT  06/20/2022   IR VENO/EXT/BI  06/20/2022   IR VENOCAVAGRAM IVC  06/20/2022   JOINT REPLACEMENT     KNEE ARTHROSCOPY     ORIF ANKLE FRACTURE Right 08/27/2019   Procedure: OPEN REDUCTION INTERNAL FIXATION RIGHT ANKLE FRACTURE;  Surgeon: Sheral Apley, MD;  Location: [facility];  Service: Orthopedics;  Laterality: Right;   perineorrophy     TONGUE BIOPSY     Allergies  Allergen Reactions   Penicillins Shortness Of Breath and Other (See Comments)    Irregular and rapid Heart Rate, too    Alprazolam Hives and Other (See Comments)    Hard to arouse, unresponsiveness also   Ativan [Lorazepam] Swelling and Other (See Comments)  Face & Throat Swelling  Note: tolerates midazolam fine   Corticosteroids Other (See Comments)    "Psychotic behavior"    Erythromycin Other (See Comments)    Severe stomach pain    Gabapentin Other (See Comments)    Made the patient feel depressed   Savella  [Milnacipran] Other (See Comments)    Reaction not noted   Prednisolone Anxiety   Prednisone Anxiety and Other (See Comments)    "Anxiety & Nervous Breakdown"   Prior to Admission medications   Medication Sig Start Date End Date Taking? Authorizing Provider  amLODipine (NORVASC) 5 MG tablet TAKE 1 TABLET(5 MG) BY MOUTH DAILY 04/24/23   Shade Flood, MD  buPROPion (WELLBUTRIN XL) 300 MG 24 hr tablet TAKE 1 TABLET(300 MG) BY MOUTH DAILY Patient not taking: Reported on 04/17/2023 11/28/22   [name], NP  busPIRone (BUSPAR) 15 MG tablet TAKE 1 TABLET(15 MG) BY MOUTH THREE TIMES DAILY 05/11/23   [name], NP  cabergoline  (DOSTINEX) 0.5 MG tablet Take 0.5 tablets (0.25 mg total) by mouth 2 (two) times a week. 06/26/23   [name], [name], MD  citalopram (CELEXA) 40 MG tablet TAKE 1 TABLET(40 MG) BY MOUTH DAILY 05/21/23   [name], NP  cyanocobalamin 1000 MCG tablet Take 1 tablet (1,000 mcg total) by mouth daily. Patient not taking: Reported on 06/23/2023 07/26/22   Lonia Blood, MD  Dextromethorphan-buPROPion ER (AUVELITY) 45-105 MG TBCR Take one tablet by mouth for 3 days, then take two tablet daily 7-8 hours between doses Patient not taking: Reported on 06/23/2023 03/07/23   [name], NP  diazepam (VALIUM) 5 MG tablet Take 1 tab 30 minutes prior to enclosed imaging study. Patient not taking: Reported on 06/23/2023 06/01/23   Shade Flood, MD  dicyclomine (BENTYL) 20 MG tablet TAKE 1 TABLET(20 MG) BY MOUTH THREE TIMES DAILY BEFORE MEALS Patient taking differently: Take 20 mg by mouth 3 (three) times daily before meals. 10/25/22   [name], MD  diphenoxylate-atropine (LOMOTIL) 2.5-0.025 MG tablet Take two tablets by mouth 4 times a day as needed for loose stools or diarrhea. 05/27/23   [name], MD  eszopiclone (LUNESTA) 2 MG TABS tablet TAKE 1 TABLET(2 MG) BY MOUTH AT BEDTIME AS NEEDED FOR SLEEP 06/26/23   [name], NP  ferrous sulfate 325 (65 FE) MG tablet Take 1 tablet (325 mg total) by mouth daily. 06/03/22 06/03/23  [name], Pratik D, DO  fondaparinux (ARIXTRA) 10 MG/0.8ML SOLN injection Inject 0.8 mLs (10 mg total) into the skin daily. 03/15/23   [name], MD  hydrocortisone (ANUSOL-HC) 2.5 % rectal cream Place 1 Application rectally 4 (four) times daily as needed for hemorrhoids or anal itching. 02/13/23   [name], MD  HYDROmorphone (DILAUDID) 4 MG tablet Take 1 tablet (4 mg total) by mouth every 6 (six) hours as needed for severe pain. 06/05/23   [name], MD  loperamide (IMODIUM) 2 MG capsule Take 2 capsules (4 mg total) by mouth 4 (four) times daily as  needed for diarrhea or loose stools. Patient taking differently: Take 4 mg by mouth in the morning, at noon, in the evening, and at bedtime. 05/08/22   Glade Lloyd, MD  meclizine (ANTIVERT) 25 MG tablet TAKE 1 TABLET(25 MG) BY MOUTH THREE TIMES DAILY AS NEEDED FOR DIZZINESS 06/21/23   [name], MD  nystatin-triamcinolone ointment [facility]) Apply 1 Application topically 2 (two) times daily. For up  to 7 days and then STOP.  Repeat as needed for flares after at least a 2 week break 01/30/23   [name], DO  ondansetron (ZOFRAN) 8 MG tablet Take 8 mg by mouth See admin instructions. Am yes and 2 day if neneced 03/03/21   [provider]  Opium 10 MG/ML (1%) TINC Take 0.6 mLs (6 mg total) by mouth every 3 (three) hours as needed for diarrhea or loose stools. 03/15/23   [name], MD  orphenadrine (NORFLEX) 100 MG tablet TAKE 1 TABLET(100 MG) BY MOUTH AT BEDTIME AS NEEDED FOR MUSCLE SPASMS 04/07/23   [name], MD  pantoprazole (PROTONIX) 40 MG tablet Take 1 tablet (40 mg total) by mouth daily before breakfast. 04/28/23   Shade Flood, MD  potassium chloride (KLOR-CON M) 10 MEQ tablet Take 2 tablets (20 mEq total) by mouth daily. Patient not taking: Reported on 04/17/2023 07/07/22   Shade Flood, MD  pramoxine-hydrocortisone (PROCTOCREAM-HC) 1-1 % rectal cream Place 1 Application rectally 2 (two) times daily. Patient taking differently: Place 1 Application rectally 2 (two) times daily as needed for hemorrhoids or anal itching (if hydrocortisone 2.5% cream is not available). 07/04/22   [name], MD  promethazine (PHENERGAN) 12.5 MG tablet TAKE 1 TABLET(12.5 MG) BY MOUTH TWICE DAILY AS NEEDED 03/24/23   Shade Flood, MD  propranolol (INDERAL) 20 MG tablet TAKE 1 TABLET(20 MG) BY MOUTH TWICE DAILY 02/06/23   Shade Flood, MD   Social History   Socioeconomic History   Marital status: Married    Spouse name: Not on file   Number of children: 3   Years  of education: Not on file   Highest education level: Some college, no degree  Occupational History   Occupation: disability  Tobacco Use   Smoking status: Never   Smokeless tobacco: Never  Vaping Use   Vaping status: Never Used  Substance and Sexual Activity   Alcohol use: No   Drug use: Never   Sexual activity: Yes    Birth control/protection: None    Comment: hysterectomy  Other Topics Concern   Not on file  Social History Narrative   Right handed   One story home   Drinks occasional caffeine   Social Determinants of Health   Financial Resource Strain: Medium Risk (04/13/2023)   Overall Financial Resource Strain (CARDIA)    Difficulty of Paying Living Expenses: Somewhat hard  Food Insecurity: No Food Insecurity (04/13/2023)   Hunger Vital Sign    Worried About Running Out of Food in the Last Year: Never true    Ran Out of Food in the Last Year: Never true  Transportation Needs: No Transportation Needs (04/13/2023)   PRAPARE - Administrator, Civil Service (Medical): No    Lack of Transportation (Non-Medical): No  Physical Activity: Insufficiently Active (04/13/2023)   Exercise Vital Sign    Days of Exercise per Week: 1 day    Minutes of Exercise per Session: 20 min  Stress: Stress Concern Present (04/13/2023)   [employer] of Occupational Health - Occupational Stress Questionnaire    Feeling of Stress : Rather much  Social Connections: Moderately Integrated (04/13/2023)   Social Connection and Isolation Panel [NHANES]    Frequency of Communication with Friends and Family: Once a week    Frequency of Social Gatherings with Friends and Family: Once a week    Attends Religious Services: More than 4 times per year  Active Member of Clubs or Organizations: Yes    Attends Banker Meetings: More than 4 times per year    Marital Status: Married  Catering manager Violence: Not At Risk (03/09/2023)   Humiliation, Afraid, Rape, and Kick questionnaire     Fear of Current or Ex-Partner: No    Emotionally Abused: No    Physically Abused: No    Sexually Abused: No    Observations/Objective: There were no vitals filed for this visit. Nontoxic appearance on video.  Speaking in full sentences without apparent facial droop, no respiratory distress.  Additional history provided by spouse.  Assessment and Plan: Fatigue, unspecified type - Plan: CBC  Diarrhea, unspecified type - Plan: Basic metabolic panel  Unsteadiness  Memory changes - Plan: B12  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)  Recommended labs to evaluate for fatigue and follow-up with her neurologist regarding memory changes, check B12.  ER precautions if any acute changes with history of metabolic encephalopathy.  Barrier cream recommended for diarrhea and skin protection.  -Given her difficulties at home I do think that home health eval for nursing, PT, OT may be helpful to assess needs.  Ordered.   Follow Up Instructions:    Patient Instructions  Call Dr. Everlena Cooper to follow up on memory changes. I did order a few blood tests initially for fatigue as well - those can be drawn at Dr. Gustavo Lah office or at [facility] location below.  Barrier cream like Desitin may be helpful to protect perianal skin. I will order home health for PT, OT, nurse eval.  Recheck in 2 weeks. Return to the clinic or go to the nearest emergency room if any of your symptoms worsen or new symptoms occur.  [facility] or xray: Walk in 8:30-4:30 during weekdays, no appointment needed [address]  [place], [place] [address]      I discussed the assessment and treatment plan with the patient. The patient was provided an opportunity to ask questions and all were answered. The patient agreed with the plan and demonstrated an understanding of the instructions.   The patient was advised to call back or seek an in-person evaluation if the symptoms worsen or if the  condition fails to improve as anticipated.   Shade Flood, MD

## 2023-07-11 ENCOUNTER — Encounter: Payer: Self-pay | Admitting: Behavioral Health

## 2023-07-11 ENCOUNTER — Telehealth (INDEPENDENT_AMBULATORY_CARE_PROVIDER_SITE_OTHER): Payer: Medicare Other | Admitting: Behavioral Health

## 2023-07-11 DIAGNOSIS — F411 Generalized anxiety disorder: Secondary | ICD-10-CM | POA: Diagnosis not present

## 2023-07-11 DIAGNOSIS — F331 Major depressive disorder, recurrent, moderate: Secondary | ICD-10-CM

## 2023-07-11 DIAGNOSIS — F99 Mental disorder, not otherwise specified: Secondary | ICD-10-CM

## 2023-07-11 DIAGNOSIS — F5105 Insomnia due to other mental disorder: Secondary | ICD-10-CM

## 2023-07-11 MED ORDER — ESZOPICLONE 2 MG PO TABS
ORAL_TABLET | ORAL | 0 refills | Status: DC
Start: 2023-07-11 — End: 2023-08-30

## 2023-07-11 MED ORDER — CITALOPRAM HYDROBROMIDE 40 MG PO TABS
40.0000 mg | ORAL_TABLET | Freq: Every day | ORAL | 1 refills | Status: DC
Start: 2023-07-11 — End: 2023-12-19

## 2023-07-11 MED ORDER — BUSPIRONE HCL 15 MG PO TABS
15.0000 mg | ORAL_TABLET | Freq: Three times a day (TID) | ORAL | 0 refills | Status: DC
Start: 2023-07-11 — End: 2023-12-19

## 2023-07-11 NOTE — Progress Notes (Signed)
Wendy Barber [id number] May 14, 1961 62 y.o.  Virtual Visit via Video Note  I connected with pt @ on 07/11/23 at 11:00 AM EDT by a video enabled telemedicine application and verified that I am speaking with the correct person using two identifiers.   I discussed the limitations of evaluation and management by telemedicine and the availability of in person appointments. The patient expressed understanding and agreed to proceed.  I discussed the assessment and treatment plan with the patient. The patient was provided an opportunity to ask questions and all were answered. The patient agreed with the plan and demonstrated an understanding of the instructions.   The patient was advised to call back or seek an in-person evaluation if the symptoms worsen or if the condition fails to improve as anticipated.  I provided 30 minutes of non-face-to-face time during this encounter.  The patient was located at home.  The provider was located at [facility].   [name], NP   Subjective:   Patient ID:  Wendy Barber is a 62 y.o. (DOB Jul 19, 1961) female.  Chief Complaint:  Chief Complaint  Patient presents with   Depression   Anxiety   Follow-up   Patient Education   Medication Refill    HPI 62 year old female presents to this office via video visit  for follow up and medication management.   Still facing a multitude of health issues. Still waiting on doctors to decide how to treat. She says there is various opinions about whether there is malignancy or not. She has appt with oncology today.  She is struggling with pain. Does not want to make any more medication changes right now. instructed due to fear of starting medical medication same time. She understands that depression related to chronic disease is very difficult to treat. Reports her anxiety today at 3/10 and depression at 4/10. She is sleeping 6 hours per night. Her family remains highly supportive. She denies any mania, no  psychosis, No current SI or HI.       No prior psychiatric medications    Review of Systems:  Review of Systems  Constitutional: Negative.   Allergic/Immunologic: Negative.   Neurological: Negative.   Psychiatric/Behavioral:  Positive for dysphoric mood.     Medications: I have reviewed the patient's current medications.  Current Outpatient Medications  Medication Sig Dispense Refill   amLODipine (NORVASC) 5 MG tablet TAKE 1 TABLET(5 MG) BY MOUTH DAILY 90 tablet 1   buPROPion (WELLBUTRIN XL) 300 MG 24 hr tablet TAKE 1 TABLET(300 MG) BY MOUTH DAILY (Patient not taking: Reported on 04/17/2023) 90 tablet 0   busPIRone (BUSPAR) 15 MG tablet Take 1 tablet (15 mg total) by mouth 3 (three) times daily. 270 tablet 0   cabergoline (DOSTINEX) 0.5 MG tablet Take 0.5 tablets (0.25 mg total) by mouth 2 (two) times a week. 12 tablet 2   citalopram (CELEXA) 40 MG tablet Take 1 tablet (40 mg total) by mouth daily. 90 tablet 1   cyanocobalamin 1000 MCG tablet Take 1 tablet (1,000 mcg total) by mouth daily. (Patient not taking: Reported on 06/23/2023)     Dextromethorphan-buPROPion ER (AUVELITY) 45-105 MG TBCR Take one tablet by mouth for 3 days, then take two tablet daily 7-8 hours between doses (Patient not taking: Reported on 06/23/2023) 60 tablet 1   diazepam (VALIUM) 5 MG tablet Take 1 tab 30 minutes prior to enclosed imaging study. (Patient not taking: Reported on 06/23/2023) 4 tablet 1   dicyclomine (BENTYL) 20 MG tablet  TAKE 1 TABLET(20 MG) BY MOUTH THREE TIMES DAILY BEFORE MEALS (Patient taking differently: Take 20 mg by mouth 3 (three) times daily before meals.) 90 tablet 4   diphenoxylate-atropine (LOMOTIL) 2.5-0.025 MG tablet Take two tablets by mouth 4 times a day as needed for loose stools or diarrhea. 240 tablet 0   eszopiclone (LUNESTA) 2 MG TABS tablet TAKE 1 TABLET(2 MG) BY MOUTH AT BEDTIME AS NEEDED FOR SLEEP 30 tablet 0   ferrous sulfate 325 (65 FE) MG tablet Take 1 tablet (325 mg total)  by mouth daily. 30 tablet 3   fondaparinux (ARIXTRA) 10 MG/0.8ML SOLN injection Inject 0.8 mLs (10 mg total) into the skin daily. 72 mL 3   hydrocortisone (ANUSOL-HC) 2.5 % rectal cream Place 1 Application rectally 4 (four) times daily as needed for hemorrhoids or anal itching. 30 g 1   HYDROmorphone (DILAUDID) 4 MG tablet Take 1 tablet (4 mg total) by mouth every 6 (six) hours as needed for severe pain. 40 tablet 0   loperamide (IMODIUM) 2 MG capsule Take 2 capsules (4 mg total) by mouth 4 (four) times daily as needed for diarrhea or loose stools. (Patient taking differently: Take 4 mg by mouth in the morning, at noon, in the evening, and at bedtime.)     meclizine (ANTIVERT) 25 MG tablet TAKE 1 TABLET(25 MG) BY MOUTH THREE TIMES DAILY AS NEEDED FOR DIZZINESS 60 tablet 0   nystatin-triamcinolone ointment (MYCOLOG) Apply 1 Application topically 2 (two) times daily. For up to 7 days and then STOP.  Repeat as needed for flares after at least a 2 week break 30 g 0   ondansetron (ZOFRAN) 8 MG tablet Take 8 mg by mouth See admin instructions. Am yes and 2 day if neneced     Opium 10 MG/ML (1%) TINC Take 0.6 mLs (6 mg total) by mouth every 3 (three) hours as needed for diarrhea or loose stools. 118 mL 0   orphenadrine (NORFLEX) 100 MG tablet TAKE 1 TABLET(100 MG) BY MOUTH AT BEDTIME AS NEEDED FOR MUSCLE SPASMS 30 tablet 2   pantoprazole (PROTONIX) 40 MG tablet Take 1 tablet (40 mg total) by mouth daily before breakfast. 90 tablet 1   potassium chloride (KLOR-CON M) 10 MEQ tablet Take 2 tablets (20 mEq total) by mouth daily. (Patient not taking: Reported on 04/17/2023) 60 tablet 1   pramoxine-hydrocortisone (PROCTOCREAM-HC) 1-1 % rectal cream Place 1 Application rectally 2 (two) times daily. (Patient taking differently: Place 1 Application rectally 2 (two) times daily as needed for hemorrhoids or anal itching (if hydrocortisone 2.5% cream is not available).) 30 g 3   promethazine (PHENERGAN) 12.5 MG tablet TAKE  1 TABLET(12.5 MG) BY MOUTH TWICE DAILY AS NEEDED 30 tablet 3   propranolol (INDERAL) 20 MG tablet TAKE 1 TABLET(20 MG) BY MOUTH TWICE DAILY 180 tablet 1   No current facility-administered medications for this visit.    Medication Side Effects: None  Allergies:  Allergies  Allergen Reactions   Penicillins Shortness Of Breath and Other (See Comments)    Irregular and rapid Heart Rate, too    Alprazolam Hives and Other (See Comments)    Hard to arouse, unresponsiveness also   Ativan [Lorazepam] Swelling and Other (See Comments)    Face & Throat Swelling  Note: tolerates midazolam fine   Corticosteroids Other (See Comments)    "Psychotic behavior"    Erythromycin Other (See Comments)    Severe stomach pain    Gabapentin Other (See Comments)  Made the patient feel depressed   Savella  [Milnacipran] Other (See Comments)    Reaction not noted   Prednisolone Anxiety   Prednisone Anxiety and Other (See Comments)    "Anxiety & Nervous Breakdown"    Past Medical History:  Diagnosis Date   Arthritis    Back pain    Cancer (HCC)    pseudomyxoma peritonei   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   Chronic fatigue syndrome    Diabetes mellitus without complication (HCC)    DVT of deep femoral vein, left (HCC) 03/19/2020   Fibromyalgia    Goals of care, counseling/discussion 03/19/2020   Hypertension    Iron deficiency anemia due to chronic blood loss 05/14/2021   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   Pernicious anemia 05/14/2021   Presence of IVC filter 03/19/2020   Pulmonary embolism, bilateral (HCC) 03/19/2020   Short bowel syndrome, unspecified     Family History  Problem Relation Age of Onset   Depression Mother    Drug abuse Sister    Suicidality Sister    Alcohol abuse Brother    Drug abuse Brother     Social History   Socioeconomic History   Marital status: Married    Spouse name: Not on file   Number of  children: 3   Years of education: Not on file   Highest education level: Some college, no degree  Occupational History   Occupation: disability  Tobacco Use   Smoking status: Never   Smokeless tobacco: Never  Vaping Use   Vaping status: Never Used  Substance and Sexual Activity   Alcohol use: No   Drug use: Never   Sexual activity: Yes    Birth control/protection: None    Comment: hysterectomy  Other Topics Concern   Not on file  Social History Narrative   Right handed   One story home   Drinks occasional caffeine   Social Determinants of Health   Financial Resource Strain: Medium Risk (04/13/2023)   Overall Financial Resource Strain (CARDIA)    Difficulty of Paying Living Expenses: Somewhat hard  Food Insecurity: No Food Insecurity (04/13/2023)   Hunger Vital Sign    Worried About Running Out of Food in the Last Year: Never true    Ran Out of Food in the Last Year: Never true  Transportation Needs: No Transportation Needs (04/13/2023)   PRAPARE - Administrator, Civil Service (Medical): No    Lack of Transportation (Non-Medical): No  Physical Activity: Insufficiently Active (04/13/2023)   Exercise Vital Sign    Days of Exercise per Week: 1 day    Minutes of Exercise per Session: 20 min  Stress: Stress Concern Present (04/13/2023)   [employer] of Occupational Health - Occupational Stress Questionnaire    Feeling of Stress : Rather much  Social Connections: Moderately Integrated (04/13/2023)   Social Connection and Isolation Panel [NHANES]    Frequency of Communication with Friends and Family: Once a week    Frequency of Social Gatherings with Friends and Family: Once a week    Attends Religious Services: More than 4 times per year    Active Member of [employer] or Organizations: Yes    Attends Banker Meetings: More than 4 times per year    Marital Status: Married  Catering manager Violence: Not At Risk (03/09/2023)   Humiliation, Afraid, Rape,  and Kick questionnaire    Fear of Current or Ex-Partner: No  Emotionally Abused: No    Physically Abused: No    Sexually Abused: No    Past Medical History, Surgical history, Social history, and Family history were reviewed and updated as appropriate.   Please see review of systems for further details on the patient's review from today.   Objective:   Physical Exam:  There were no vitals taken for this visit.  Physical Exam Neurological:     Mental Status: She is alert and oriented to person, place, and time.  Psychiatric:        Attention and Perception: Attention and perception normal.        Mood and Affect: Mood normal.        Speech: Speech normal.        Behavior: Behavior normal. Behavior is cooperative.        Cognition and Memory: Cognition and memory normal.        Judgment: Judgment normal.     Comments: Insight intact     Lab Review:     Component Value Date/Time   NA 137 04/24/2023 1437   K 4.6 04/24/2023 1437   CL 100 04/24/2023 1437   CO2 23 04/24/2023 1437   GLUCOSE 178 (H) 04/24/2023 1437   GLUCOSE 154 (H) 03/15/2023 1544   BUN 18 04/24/2023 1437   CREATININE 0.89 04/24/2023 1437   CREATININE 1.07 (H) 03/15/2023 1544   CALCIUM 9.3 04/24/2023 1437   PROT 7.3 03/15/2023 1544   ALBUMIN 4.1 03/15/2023 1544   AST 23 03/15/2023 1544   ALT 26 03/15/2023 1544   ALKPHOS 142 (H) 03/15/2023 1544   BILITOT 0.4 03/15/2023 1544   GFRNONAA 59 (L) 03/15/2023 1544   GFRAA >60 07/27/2020 0922       Component Value Date/Time   WBC 8.8 06/13/2023 0843   RBC 3.75 (L) 06/13/2023 0843   HGB 11.7 (L) 06/13/2023 0843   HGB 12.1 03/15/2023 1544   HCT 36.0 06/13/2023 0843   PLT 295 06/13/2023 0843   PLT 203 03/15/2023 1544   MCV 96.0 06/13/2023 0843   MCH 31.2 06/13/2023 0843   MCHC 32.5 06/13/2023 0843   RDW 13.9 06/13/2023 0843   LYMPHSABS 3.6 03/15/2023 1544   MONOABS 0.7 03/15/2023 1544   EOSABS 0.2 03/15/2023 1544   BASOSABS 0.1 03/15/2023 1544     No results found for: "POCLITH", "LITHIUM"   No results found for: "PHENYTOIN", "PHENOBARB", "VALPROATE", "CBMZ"   .res Assessment: Plan:    Greater than 50% of 30 min video visit with patient was spent on counseling and coordination of care. Pt is waiting on physicians to decide how to treat her. Conflicted with reports of malignancy or not. Increased abdominal pain. Pt laying in bed for appointment. We discussed her current level of stability with anxiety and depression. We talked about her Chronic and serious health issues complicating tx options for depression.   Requesting no medication changes this visit. She also has started Opium Tinture for her Diarrhea.  We agreed to: Stopped the Auvelity Continue Buspar  to 15 mg to  three times daily as needed. Continue Celexa to 40 mg daily. Will take with food to help with nausea. Could make diarrhea worse in the beginning but may pass after a week or two.  To Continue Lunesta 5 mg at betime. Belsomra was not approved by insurance. She is happy with Alfonso Patten but says causes metallic taste in mouth next day.  Will report worsening symptoms or side effects promptly To follow up in  12  weeks to reassess Will continue to follow up with PCP regularly Provided emergency contact information Reviewed PDMP     Arlys John A. Salli Bodin  Arraya was seen today for depression, anxiety, follow-up, patient education and medication refill.  Diagnoses and all orders for this visit:  Major depressive disorder, recurrent episode, moderate (HCC) -     busPIRone (BUSPAR) 15 MG tablet; Take 1 tablet (15 mg total) by mouth 3 (three) times daily. -     citalopram (CELEXA) 40 MG tablet; Take 1 tablet (40 mg total) by mouth daily.  Generalized anxiety disorder -     busPIRone (BUSPAR) 15 MG tablet; Take 1 tablet (15 mg total) by mouth 3 (three) times daily. -     citalopram (CELEXA) 40 MG tablet; Take 1 tablet (40 mg total) by mouth daily.  Insomnia due to other  mental disorder -     eszopiclone (LUNESTA) 2 MG TABS tablet; TAKE 1 TABLET(2 MG) BY MOUTH AT BEDTIME AS NEEDED FOR SLEEP     Please see After Visit Summary for patient specific instructions.  Future Appointments  Date Time Provider Department Center  07/12/2023  9:00 AM [facility] None  07/12/2023  9:15 AM Ennever, Rose Phi, MD [facility] None  08/04/2023 12:30 PM [facility] [place] [facility]  09/25/2023  8:15 AM [facility] None  12/22/2023 12:10 PM [name], [name], MD [facility] None  07/08/2024  1:30 PM Rennis Chris, MD [facility] None    No orders of the defined types were placed in this encounter.     -------------------------------

## 2023-07-12 ENCOUNTER — Inpatient Hospital Stay: Payer: Medicare Other | Attending: Hematology & Oncology

## 2023-07-12 ENCOUNTER — Encounter: Payer: Self-pay | Admitting: Hematology & Oncology

## 2023-07-12 ENCOUNTER — Other Ambulatory Visit: Payer: Self-pay

## 2023-07-12 ENCOUNTER — Inpatient Hospital Stay (HOSPITAL_BASED_OUTPATIENT_CLINIC_OR_DEPARTMENT_OTHER): Payer: Medicare Other | Admitting: Hematology & Oncology

## 2023-07-12 ENCOUNTER — Inpatient Hospital Stay: Payer: Medicare Other

## 2023-07-12 VITALS — BP 117/83 | HR 70 | Temp 98.3°F | Resp 16 | Ht 66.0 in | Wt 251.1 lb

## 2023-07-12 DIAGNOSIS — R112 Nausea with vomiting, unspecified: Secondary | ICD-10-CM | POA: Diagnosis not present

## 2023-07-12 DIAGNOSIS — M549 Dorsalgia, unspecified: Secondary | ICD-10-CM | POA: Insufficient documentation

## 2023-07-12 DIAGNOSIS — R109 Unspecified abdominal pain: Secondary | ICD-10-CM | POA: Insufficient documentation

## 2023-07-12 DIAGNOSIS — D510 Vitamin B12 deficiency anemia due to intrinsic factor deficiency: Secondary | ICD-10-CM | POA: Insufficient documentation

## 2023-07-12 DIAGNOSIS — Z880 Allergy status to penicillin: Secondary | ICD-10-CM | POA: Diagnosis not present

## 2023-07-12 DIAGNOSIS — R300 Dysuria: Secondary | ICD-10-CM | POA: Insufficient documentation

## 2023-07-12 DIAGNOSIS — C772 Secondary and unspecified malignant neoplasm of intra-abdominal lymph nodes: Secondary | ICD-10-CM | POA: Diagnosis not present

## 2023-07-12 DIAGNOSIS — Z881 Allergy status to other antibiotic agents status: Secondary | ICD-10-CM | POA: Diagnosis not present

## 2023-07-12 DIAGNOSIS — Z888 Allergy status to other drugs, medicaments and biological substances status: Secondary | ICD-10-CM | POA: Insufficient documentation

## 2023-07-12 DIAGNOSIS — C181 Malignant neoplasm of appendix: Secondary | ICD-10-CM

## 2023-07-12 DIAGNOSIS — F32A Depression, unspecified: Secondary | ICD-10-CM | POA: Diagnosis not present

## 2023-07-12 DIAGNOSIS — Z79899 Other long term (current) drug therapy: Secondary | ICD-10-CM | POA: Diagnosis not present

## 2023-07-12 DIAGNOSIS — R160 Hepatomegaly, not elsewhere classified: Secondary | ICD-10-CM | POA: Diagnosis not present

## 2023-07-12 DIAGNOSIS — R197 Diarrhea, unspecified: Secondary | ICD-10-CM | POA: Diagnosis not present

## 2023-07-12 DIAGNOSIS — E860 Dehydration: Secondary | ICD-10-CM

## 2023-07-12 DIAGNOSIS — K769 Liver disease, unspecified: Secondary | ICD-10-CM

## 2023-07-12 DIAGNOSIS — R5383 Other fatigue: Secondary | ICD-10-CM | POA: Insufficient documentation

## 2023-07-12 DIAGNOSIS — Z7901 Long term (current) use of anticoagulants: Secondary | ICD-10-CM | POA: Diagnosis not present

## 2023-07-12 DIAGNOSIS — C178 Malignant neoplasm of overlapping sites of small intestine: Secondary | ICD-10-CM

## 2023-07-12 DIAGNOSIS — I2699 Other pulmonary embolism without acute cor pulmonale: Secondary | ICD-10-CM | POA: Insufficient documentation

## 2023-07-12 DIAGNOSIS — K7460 Unspecified cirrhosis of liver: Secondary | ICD-10-CM | POA: Insufficient documentation

## 2023-07-12 DIAGNOSIS — I82412 Acute embolism and thrombosis of left femoral vein: Secondary | ICD-10-CM

## 2023-07-12 LAB — CBC WITH DIFFERENTIAL (CANCER CENTER ONLY)
Abs Immature Granulocytes: 0.01 10*3/uL (ref 0.00–0.07)
Basophils Absolute: 0.1 10*3/uL (ref 0.0–0.1)
Basophils Relative: 1 %
Eosinophils Absolute: 0.2 10*3/uL (ref 0.0–0.5)
Eosinophils Relative: 3 %
HCT: 38.5 % (ref 36.0–46.0)
Hemoglobin: 12.5 g/dL (ref 12.0–15.0)
Immature Granulocytes: 0 %
Lymphocytes Relative: 45 %
Lymphs Abs: 3 10*3/uL (ref 0.7–4.0)
MCH: 30.7 pg (ref 26.0–34.0)
MCHC: 32.5 g/dL (ref 30.0–36.0)
MCV: 94.6 fL (ref 80.0–100.0)
Monocytes Absolute: 0.6 10*3/uL (ref 0.1–1.0)
Monocytes Relative: 9 %
Neutro Abs: 2.8 10*3/uL (ref 1.7–7.7)
Neutrophils Relative %: 42 %
Platelet Count: 211 10*3/uL (ref 150–400)
RBC: 4.07 MIL/uL (ref 3.87–5.11)
RDW: 13.6 % (ref 11.5–15.5)
WBC Count: 6.6 10*3/uL (ref 4.0–10.5)
nRBC: 0 % (ref 0.0–0.2)

## 2023-07-12 LAB — CMP (CANCER CENTER ONLY)
ALT: 21 U/L (ref 0–44)
AST: 21 U/L (ref 15–41)
Albumin: 3.9 g/dL (ref 3.5–5.0)
Alkaline Phosphatase: 146 U/L — ABNORMAL HIGH (ref 38–126)
Anion gap: 7 (ref 5–15)
BUN: 16 mg/dL (ref 6–20)
CO2: 28 mmol/L (ref 22–32)
Calcium: 9.3 mg/dL (ref 8.9–10.3)
Chloride: 103 mmol/L (ref 98–111)
Creatinine: 0.91 mg/dL (ref 0.44–1.00)
GFR, Estimated: >60 mL/min (ref >60)
Glucose, Bld: 158 mg/dL — ABNORMAL HIGH (ref 70–99)
Potassium: 3.8 mmol/L (ref 3.5–5.1)
Sodium: 138 mmol/L (ref 135–145)
Total Bilirubin: 0.4 mg/dL (ref 0.3–1.2)
Total Protein: 6.9 g/dL (ref 6.5–8.1)

## 2023-07-12 LAB — IRON AND IRON BINDING CAPACITY (CC-WL,HP ONLY)
Iron: 87 ug/dL (ref 28–170)
Saturation Ratios: 23 % (ref 10.4–31.8)
TIBC: 379 ug/dL (ref 250–450)
UIBC: 292 ug/dL (ref 148–442)

## 2023-07-12 LAB — FERRITIN: Ferritin: 50 ng/mL (ref 11–307)

## 2023-07-12 MED ORDER — HYDROMORPHONE HCL 1 MG/ML IJ SOLN: 1.0000 mg | Freq: Once | INTRAMUSCULAR | Status: DC

## 2023-07-12 MED ORDER — SODIUM CHLORIDE 0.9 % IV SOLN: INTRAVENOUS | Status: DC

## 2023-07-12 MED ORDER — LANREOTIDE ACETATE 120 MG/0.5ML ~~LOC~~ SOLN
120.0000 mg | Freq: Once | SUBCUTANEOUS | Status: AC
  Administered 2023-07-12: 120 mg via SUBCUTANEOUS
  Filled 2023-07-12: qty 120

## 2023-07-12 MED ORDER — ONDANSETRON HCL 4 MG/2ML IJ SOLN
4.0000 mg | Freq: Once | INTRAMUSCULAR | Status: AC
  Administered 2023-07-12: 4 mg via INTRAVENOUS
  Filled 2023-07-12: qty 2

## 2023-07-12 MED ORDER — HYDROMORPHONE HCL 1 MG/ML IJ SOLN
1.0000 mg | Freq: Once | INTRAMUSCULAR | Status: AC
  Administered 2023-07-12: 1 mg via INTRAVENOUS
  Filled 2023-07-12: qty 1

## 2023-07-12 MED ORDER — SODIUM CHLORIDE 0.9 % IV SOLN
10.0000 mg | Freq: Once | INTRAVENOUS | Status: AC
  Administered 2023-07-12: 10 mg via INTRAVENOUS
  Filled 2023-07-12: qty 10

## 2023-07-12 MED ORDER — SODIUM CHLORIDE 0.9% FLUSH
10.0000 mL | Freq: Once | INTRAVENOUS | Status: AC
  Administered 2023-07-12: 10 mL via INTRAVENOUS

## 2023-07-12 MED ORDER — HEPARIN SOD (PORK) LOCK FLUSH 100 UNIT/ML IV SOLN
500.0000 [IU] | Freq: Once | INTRAVENOUS | Status: AC
  Administered 2023-07-12: 500 [IU] via INTRAVENOUS

## 2023-07-12 NOTE — Patient Instructions (Signed)
Dehydration, Adult Dehydration is a condition in which there is not enough water or other fluids in the body. This happens when a person loses more fluids than they take in. Important organs cannot work right without the right amount of fluids. Any loss of fluids from the body can cause dehydration. Dehydration can be mild, worse, or very bad. It should be treated right away to keep it from getting very bad. What are the causes? Conditions that cause loss of water in the body. They include: Watery poop (diarrhea). Vomiting. Sweating a lot. Fever. Infection. Peeing (urinating) a lot. Not drinking enough fluids. Certain medicines, such as medicines that take extra fluid out of the body (diuretics). Lack of safe drinking water. Not being able to get enough water and food. What increases the risk? Having a long-term (chronic) illness that has not been treated the right way, such as: Diabetes. Heart disease. Kidney disease. Being 18 years of age or older. Having a disability. Living in a place that is high above the ground or sea (high in altitude). The thinner, drier air causes more fluid loss. Doing exercises that put stress on your body for a long time. Being active when in hot places. What are the signs or symptoms? Symptoms of dehydration depend on how bad it is. Mild or worse dehydration Thirst. Dry lips or dry mouth. Feeling dizzy or light-headed. Muscle cramps. Passing little pee or dark pee. Pee may be the color of tea. Headache. Very bad dehydration Changes in skin. Skin may: Be cold to the touch (clammy). Be blotchy or pale. Not go back to normal right after you pinch it and let it go. Little or no tears, pee, or sweat. Fast breathing. Low blood pressure. Weak pulse. Pulse that is more than 100 beats a minute when you are sitting still. Other changes, such as: Feeling very thirsty. Eyes that look hollow (sunken). Cold hands and feet. Being confused. Being very  tired (lethargic) or having trouble waking from sleep. Losing weight. Loss of consciousness. How is this treated? Treatment for this condition depends on how bad your dehydration is. Treatment should start right away. Do not wait until your condition gets very bad. Very bad dehydration is an emergency. You will need to go to a hospital. Mild or worse dehydration can be treated at home. You may be asked to: Drink more fluids. Drink an oral rehydration solution (ORS). This drink gives you the right amount of fluids, salts, and minerals (electrolytes). Very bad dehydration can be treated: With fluids through an IV tube. By correcting low levels of electrolytes in the body. By treating the problem that caused your dehydration. Follow these instructions at home: Oral rehydration solution If told by your doctor, drink an ORS: Make an ORS. Use instructions on the package. Start by drinking small amounts, about  cup (120 mL) every 5-10 minutes. Slowly drink more until you have had the amount that your doctor said to have.  Eating and drinking  Drink enough clear fluid to keep your pee pale yellow. If you were told to drink an ORS, finish the ORS first. Then, start slowly drinking other clear fluids. Drink fluids such as: Water. Do not drink only water. Doing that can make the salt (sodium) level in your body get too low. Water from ice chips you suck on. Fruit juice that you have added water to (diluted). Low-calorie sports drinks. Eat foods that have the right amounts of salts and minerals, such as bananas, oranges, potatoes,  tomatoes, or spinach. Do not drink alcohol. Avoid drinks that have caffeine or sugar. These include:: High-calorie sports drinks. Fruit juice that you did not add water to. Soda. Coffee or energy drinks. Avoid foods that are greasy or have a lot of fat or sugar. General instructions Take over-the-counter and prescription medicines only as told by your doctor. Do  not take sodium tablets. Doing that can make the salt level in your body get too high. Return to your normal activities as told by your doctor. Ask your doctor what activities are safe for you. Keep all follow-up visits. Your doctor may check and change your treatment. Contact a doctor if: You have pain in your belly (abdomen) and the pain: Gets worse. Stays in one place. You have a rash. You have a stiff neck. You get angry or annoyed more easily than normal. You are more tired or have a harder time waking than normal. You feel weak or dizzy. You feel very thirsty. Get help right away if: You have any symptoms of very bad dehydration. You vomit every time you eat or drink. Your vomiting gets worse, does not go away, or you vomit blood or green stuff. You are getting treatment, but symptoms are getting worse. You have a fever. You have a very bad headache. You have: Diarrhea that gets worse or does not go away. Blood in your poop (stool). This may cause poop to look black and tarry. No pee in 6-8 hours. Only a small amount of pee in 6-8 hours, and the pee is very dark. You have trouble breathing. These symptoms may be an emergency. Get help right away. Call 911. Do not wait to see if the symptoms will go away. Do not drive yourself to the hospital. This information is not intended to replace advice given to you by your health care provider. Make sure you discuss any questions you have with your health care provider. Document Revised: 05/09/2022 Document Reviewed: 05/09/2022 Elsevier Patient Education  2024 [employer].

## 2023-07-13 ENCOUNTER — Encounter: Payer: Self-pay | Admitting: Hematology & Oncology

## 2023-07-13 LAB — CHROMOGRANIN A: Chromogranin A (ng/mL): 213.7 ng/mL — ABNORMAL HIGH (ref 0.0–101.8)

## 2023-07-13 MED ORDER — HYDROMORPHONE HCL 4 MG PO TABS: 4.0000 mg | ORAL_TABLET | Freq: Four times a day (QID) | ORAL | 0 refills | Status: DC | PRN

## 2023-07-13 NOTE — Progress Notes (Signed)
Hematology and Oncology Follow Up Visit  Wendy Barber [id number] [birth year] 62 y.o. 07/13/2023   Principle Diagnosis:  History of metastatic appendiceal cancer  -- recurrent  Superior mesenteric vein thrombus Thromboembolism of the RIGHT leg Pernicious anemia Acute pulmonary embolism-segmental right pulmonary artery Carcinoid/neuroendocrine carcinoma  Current Therapy:   HIPEC - Surgery done in [place] in 11/2020 Arixtra 10 mg subcu daily --start on 2/16 2024 Vitamin B12 5000 mcg PO daily  Somatuline 120 mg IM monthly-start on 07/12/2023     Interim History:  Wendy Barber is in for follow-up.  She has been quite busy since we last saw her.  She has had a PET scan that was done.  This was a dotatate PET scan because of her elevated Chromogranin A.  This was done on 04/11/2023.  The PET scan showed 2 areas of increased uptake in the dome of the right lobe of the liver.  We then got an MRI of the liver.  This was done on 05/31/2023.  This showed very dense masslike enhancement of the liver no.  Had a area of abnormality measured 7.9 x 4.4 cm.  This corresponds to the area of PET avidity.  She did have some cirrhosis.  There is some hepatomegaly.  We then got a biopsy.  This was done on 06/08/2023.  The pathology report ([id number]) showed chronic hepatitis with portal inflammation cirrhosis.  There is no obvious malignancy.  I spoke with IR.  They did not feel that it would be safe to attempt another biopsy given the location of the liver abnormality.  As such, we have decided to treat her empirically with Somatuline.  Maybe, this will help with her diarrhea.  Maybe, the diarrhea could be from carcinoid.  I think Somatuline would be a worthwhile option for her.  I think she can tolerate this.  She still has problems with diarrhea.  She had problems with nausea and vomiting this morning.  Pain is still a chronic issue for her.  She takes hydromorphone.  This does seem to help.  She   continues on Arixtra for her thromboembolic disease.  She has had no cough.  There is no chest wall pain.  There is no rashes.  She has had a little bit of leg swelling but again this is chronic.  Overall, I would say that her performance status is probably ECOG 2.    Medications:  Current Outpatient Medications:    amLODipine (NORVASC) 5 MG tablet, TAKE 1 TABLET(5 MG) BY MOUTH DAILY, Disp: 90 tablet, Rfl: 1   busPIRone (BUSPAR) 15 MG tablet, Take 1 tablet (15 mg total) by mouth 3 (three) times daily., Disp: 270 tablet, Rfl: 0   cabergoline (DOSTINEX) 0.5 MG tablet, Take 0.5 tablets (0.25 mg total) by mouth 2 (two) times a week., Disp: 12 tablet, Rfl: 2   citalopram (CELEXA) 40 MG tablet, Take 1 tablet (40 mg total) by mouth daily., Disp: 90 tablet, Rfl: 1   cyanocobalamin 1000 MCG tablet, Take 1 tablet (1,000 mcg total) by mouth daily., Disp: , Rfl:    dicyclomine (BENTYL) 20 MG tablet, TAKE 1 TABLET(20 MG) BY MOUTH THREE TIMES DAILY BEFORE MEALS (Patient taking differently: Take 20 mg by mouth 3 (three) times daily before meals.), Disp: 90 tablet, Rfl: 4   eszopiclone (LUNESTA) 2 MG TABS tablet, TAKE 1 TABLET(2 MG) BY MOUTH AT BEDTIME AS NEEDED FOR SLEEP, Disp: 30 tablet, Rfl: 0   fondaparinux (ARIXTRA) 10 MG/0.8ML SOLN injection, Inject 0.8 mLs (10 mg  total) into the skin daily., Disp: 72 mL, Rfl: 3   hydrocortisone (ANUSOL-HC) 2.5 % rectal cream, Place 1 Application rectally 4 (four) times daily as needed for hemorrhoids or anal itching., Disp: 30 g, Rfl: 1   HYDROmorphone (DILAUDID) 4 MG tablet, Take 1 tablet (4 mg total) by mouth every 6 (six) hours as needed for severe pain., Disp: 40 tablet, Rfl: 0   meclizine (ANTIVERT) 25 MG tablet, TAKE 1 TABLET(25 MG) BY MOUTH THREE TIMES DAILY AS NEEDED FOR DIZZINESS, Disp: 60 tablet, Rfl: 0   ondansetron (ZOFRAN) 8 MG tablet, Take 8 mg by mouth See admin instructions. Am yes and 2 day if neneced, Disp: , Rfl:    Opium 10 MG/ML (1%) TINC, Take 0.6  mLs (6 mg total) by mouth every 3 (three) hours as needed for diarrhea or loose stools., Disp: 118 mL, Rfl: 0   orphenadrine (NORFLEX) 100 MG tablet, TAKE 1 TABLET(100 MG) BY MOUTH AT BEDTIME AS NEEDED FOR MUSCLE SPASMS, Disp: 30 tablet, Rfl: 2   pantoprazole (PROTONIX) 40 MG tablet, Take 1 tablet (40 mg total) by mouth daily before breakfast., Disp: 90 tablet, Rfl: 1   pramoxine-hydrocortisone (PROCTOCREAM-HC) 1-1 % rectal cream, Place 1 Application rectally 2 (two) times daily. (Patient taking differently: Place 1 Application rectally 2 (two) times daily as needed for hemorrhoids or anal itching (if hydrocortisone 2.5% cream is not available).), Disp: 30 g, Rfl: 3   promethazine (PHENERGAN) 12.5 MG tablet, TAKE 1 TABLET(12.5 MG) BY MOUTH TWICE DAILY AS NEEDED, Disp: 30 tablet, Rfl: 3   propranolol (INDERAL) 20 MG tablet, TAKE 1 TABLET(20 MG) BY MOUTH TWICE DAILY, Disp: 180 tablet, Rfl: 1   buPROPion (WELLBUTRIN XL) 300 MG 24 hr tablet, TAKE 1 TABLET(300 MG) BY MOUTH DAILY (Patient not taking: Reported on 04/17/2023), Disp: 90 tablet, Rfl: 0   Dextromethorphan-buPROPion ER (AUVELITY) 45-105 MG TBCR, Take one tablet by mouth for 3 days, then take two tablet daily 7-8 hours between doses (Patient not taking: Reported on 06/23/2023), Disp: 60 tablet, Rfl: 1   diazepam (VALIUM) 5 MG tablet, Take 1 tab 30 minutes prior to enclosed imaging study. (Patient not taking: Reported on 06/23/2023), Disp: 4 tablet, Rfl: 1   diphenoxylate-atropine (LOMOTIL) 2.5-0.025 MG tablet, Take two tablets by mouth 4 times a day as needed for loose stools or diarrhea. (Patient not taking: Reported on 07/12/2023), Disp: 240 tablet, Rfl: 0   ferrous sulfate 325 (65 FE) MG tablet, Take 1 tablet (325 mg total) by mouth daily., Disp: 30 tablet, Rfl: 3   loperamide (IMODIUM) 2 MG capsule, Take 2 capsules (4 mg total) by mouth 4 (four) times daily as needed for diarrhea or loose stools. (Patient not taking: Reported on 07/12/2023), Disp: ,  Rfl:    nystatin-triamcinolone ointment (MYCOLOG), Apply 1 Application topically 2 (two) times daily. For up to 7 days and then STOP.  Repeat as needed for flares after at least a 2 week break (Patient not taking: Reported on 07/12/2023), Disp: 30 g, Rfl: 0   potassium chloride (KLOR-CON M) 10 MEQ tablet, Take 2 tablets (20 mEq total) by mouth daily. (Patient not taking: Reported on 04/17/2023), Disp: 60 tablet, Rfl: 1  Current Facility-Administered Medications:    HYDROmorphone (DILAUDID) injection 1 mg, 1 mg, Intravenous, Once, Wendy Barber, Wendy Phi, MD  Allergies:  Allergies  Allergen Reactions   Penicillins Shortness Of Breath and Other (See Comments)    Irregular and rapid Heart Rate, too    Alprazolam Hives and Other (See  Comments)    Hard to arouse, unresponsiveness also   Ativan [Lorazepam] Swelling and Other (See Comments)    Face & Throat Swelling  Note: tolerates midazolam fine   Corticosteroids Other (See Comments)    "Psychotic behavior"    Erythromycin Other (See Comments)    Severe stomach pain    Gabapentin Other (See Comments)    Made the patient feel depressed   Savella  [Milnacipran] Other (See Comments)    Reaction not noted   Prednisolone Anxiety   Prednisone Anxiety and Other (See Comments)    "Anxiety & Nervous Breakdown"    Past Medical History, Surgical history, Social history, and Family History were reviewed and updated.  Review of Systems: Review of Systems  Constitutional:  Positive for fatigue and unexpected weight change.  HENT:  Negative.    Eyes: Negative.   Respiratory: Negative.    Cardiovascular: Negative.   Gastrointestinal:  Positive for abdominal pain and diarrhea.  Endocrine: Negative.   Genitourinary:  Positive for dysuria.   Musculoskeletal:  Positive for back pain.  Skin: Negative.   Neurological: Negative.   Hematological: Negative.   Psychiatric/Behavioral:  Positive for depression.     Physical Exam: Vital signs show a  temperature of 98 3 pulse 73.  Blood pressure 133/67.  Weight is 221 pounds.    Wt Readings from Last 3 Encounters:  07/12/23 251 lb 1.9 oz (113.9 kg)  06/13/23 245 lb (111.1 kg)  04/17/23 252 lb 9.6 oz (114.6 kg)    Physical Exam Vitals reviewed.  HENT:     Head: Normocephalic and atraumatic.  Eyes:     Pupils: Pupils are equal, round, and reactive to light.  Cardiovascular:     Rate and Rhythm: Normal rate and regular rhythm.     Heart sounds: Normal heart sounds.  Pulmonary:     Effort: Pulmonary effort is normal.     Breath sounds: Normal breath sounds.  Abdominal:     General: Bowel sounds are normal.     Palpations: Abdomen is soft.     Comments: Abdominal exam shows healed laparotomy scar.  She has no fluid wave.  There is no guarding or rebound tenderness.  She has decent bowel sounds.  There is no palpable liver or spleen tip.  Musculoskeletal:        General: No tenderness or deformity. Normal range of motion.     Cervical back: Normal range of motion.  Lymphadenopathy:     Cervical: No cervical adenopathy.  Skin:    General: Skin is warm and dry.     Findings: No erythema or rash.  Neurological:     Mental Status: She is alert and oriented to person, place, and time.  Psychiatric:        Behavior: Behavior normal.        Thought Content: Thought content normal.        Judgment: Judgment normal.      Lab Results  Component Value Date   WBC 6.6 07/12/2023   HGB 12.5 07/12/2023   HCT 38.5 07/12/2023   MCV 94.6 07/12/2023   PLT 211 07/12/2023     Chemistry      Component Value Date/Time   NA 138 07/12/2023 0932   NA 137 04/24/2023 1437   K 3.8 07/12/2023 0932   CL 103 07/12/2023 0932   CO2 28 07/12/2023 0932   BUN 16 07/12/2023 0932   BUN 18 04/24/2023 1437   CREATININE 0.91 07/12/2023 0932  Component Value Date/Time   CALCIUM 9.3 07/12/2023 0932   ALKPHOS 146 (H) 07/12/2023 0932   AST 21 07/12/2023 0932   ALT 21 07/12/2023 0932   BILITOT  0.4 07/12/2023 0932      Impression and Plan: Ms. Jacoway is a very charming 62 year-old white female.  She has an incredible history.  She has had problems with recurrent appendiceal cancer.  She underwent a HIPEC procedure in [place]  a couple years ago.  She has had problems from that procedure.  She has had horrible diarrhea.  She has had C. difficile.  Despite C. difficile being treated, she still has diarrhea.  I cannot imagine that the appendiceal cancer is a problem given the fact that she has a normal CEA.  Again, given the high Chromogranin A level, I really have to think that treating her for carcinoid would be a reasonable way to try to help her.  We will start with the Somatuline today.  Maybe, we will see that diarrhea improves.  I know this is what is dictating her quality of life.  We will plan to get her back in 1 month for follow-up.  Hopefully, she will stabilize a little bit.  I just feel that she has this quality of life which is somewhat compromised because of the diarrhea.      [name], MD 9/19/20246:18 AM

## 2023-07-14 ENCOUNTER — Telehealth: Payer: Self-pay

## 2023-07-14 NOTE — Telephone Encounter (Signed)
Advised via MyChart.

## 2023-07-14 NOTE — Telephone Encounter (Signed)
-----   Message from [name] sent at 07/13/2023  5:33 PM EDT ----- Call and let her know that the chromogranin A level is actually better at 213.  Thanks.Marland Kitchen

## 2023-07-21 ENCOUNTER — Telehealth: Payer: Self-pay | Admitting: Family Medicine

## 2023-07-21 NOTE — Telephone Encounter (Signed)
Yes, okay to provide verbal orders as listed.

## 2023-07-21 NOTE — Telephone Encounter (Signed)
Home Health Verbal Orders  Agency: [facility]  Caller: Amy  Call back #: [phone] Fax #:    Requesting PT:    Reason for Request:VO    Frequency: 2wk1, 1wk3

## 2023-07-21 NOTE — Telephone Encounter (Signed)
Sent  to Dr.Greene 

## 2023-07-21 NOTE — Telephone Encounter (Signed)
Called left vm to call back

## 2023-07-23 ENCOUNTER — Encounter: Payer: Self-pay | Admitting: Hematology & Oncology

## 2023-07-24 NOTE — Telephone Encounter (Signed)
Left vm to call office

## 2023-07-24 NOTE — Telephone Encounter (Signed)
Amy has been notified. 

## 2023-07-25 ENCOUNTER — Other Ambulatory Visit: Payer: Self-pay | Admitting: Family Medicine

## 2023-07-25 ENCOUNTER — Telehealth: Payer: Self-pay | Admitting: Family Medicine

## 2023-07-25 DIAGNOSIS — R110 Nausea: Secondary | ICD-10-CM

## 2023-07-25 NOTE — Telephone Encounter (Signed)
Received forms from [facility] Printed & placed in provider bin

## 2023-07-25 NOTE — Telephone Encounter (Signed)
Placed in your sign folder  

## 2023-07-26 ENCOUNTER — Other Ambulatory Visit: Payer: Self-pay | Admitting: Hematology & Oncology

## 2023-07-26 NOTE — Telephone Encounter (Signed)
Patient is requesting a refill of the following medications: Requested Prescriptions   Pending Prescriptions Disp Refills   promethazine (PHENERGAN) 12.5 MG tablet [Pharmacy Med Name: PROMETHAZINE 12.5MG  TABLETS] 30 tablet 3    Sig: TAKE 1 TABLET(12.5 MG) BY MOUTH TWICE DAILY AS NEEDED    Date of patient request: 07/26/23 Last office visit: 07/10/23 Date of last refill: 03/24/23 Last refill amount: 30 Follow up time period per chart: if worsen or fail to improve

## 2023-07-26 NOTE — Telephone Encounter (Signed)
Paperwork completed and placed in fax bin at back nurse station

## 2023-08-02 ENCOUNTER — Telehealth: Payer: Self-pay | Admitting: Family Medicine

## 2023-08-02 NOTE — Telephone Encounter (Signed)
FYI

## 2023-08-02 NOTE — Telephone Encounter (Signed)
Home Health called to tell us pt will receive once a week therapy for the next 5 weeks with primary focus on independent ADLs

## 2023-08-03 ENCOUNTER — Telehealth: Payer: Self-pay | Admitting: Family Medicine

## 2023-08-03 NOTE — Telephone Encounter (Signed)
Placed in Sign folder

## 2023-08-03 NOTE — Telephone Encounter (Signed)
Paperwork completed and placed in fax bin at back nurse station

## 2023-08-03 NOTE — Telephone Encounter (Signed)
Received forms from [facility] Printed & placed in provider bin

## 2023-08-04 ENCOUNTER — Ambulatory Visit
Admission: RE | Admit: 2023-08-04 | Discharge: 2023-08-04 | Disposition: A | Discharge status: Home / Self Care | Payer: Medicare Other | Source: Ambulatory Visit | Attending: Family Medicine | Admitting: Family Medicine

## 2023-08-04 ENCOUNTER — Encounter: Payer: Self-pay | Admitting: Hematology & Oncology

## 2023-08-04 DIAGNOSIS — N641 Fat necrosis of breast: Secondary | ICD-10-CM

## 2023-08-09 ENCOUNTER — Inpatient Hospital Stay (HOSPITAL_BASED_OUTPATIENT_CLINIC_OR_DEPARTMENT_OTHER): Payer: Medicare Other | Admitting: Hematology & Oncology

## 2023-08-09 ENCOUNTER — Other Ambulatory Visit: Payer: Self-pay | Admitting: Oncology

## 2023-08-09 ENCOUNTER — Encounter: Payer: Self-pay | Admitting: Hematology & Oncology

## 2023-08-09 ENCOUNTER — Inpatient Hospital Stay: Payer: Medicare Other

## 2023-08-09 ENCOUNTER — Inpatient Hospital Stay: Payer: Medicare Other | Attending: Hematology & Oncology

## 2023-08-09 VITALS — BP 122/77 | HR 62 | Temp 98.0°F | Resp 19 | Ht 66.0 in | Wt 250.0 lb

## 2023-08-09 DIAGNOSIS — Z888 Allergy status to other drugs, medicaments and biological substances status: Secondary | ICD-10-CM | POA: Insufficient documentation

## 2023-08-09 DIAGNOSIS — Z7901 Long term (current) use of anticoagulants: Secondary | ICD-10-CM | POA: Diagnosis not present

## 2023-08-09 DIAGNOSIS — Z79899 Other long term (current) drug therapy: Secondary | ICD-10-CM | POA: Insufficient documentation

## 2023-08-09 DIAGNOSIS — R300 Dysuria: Secondary | ICD-10-CM | POA: Diagnosis not present

## 2023-08-09 DIAGNOSIS — D510 Vitamin B12 deficiency anemia due to intrinsic factor deficiency: Secondary | ICD-10-CM | POA: Insufficient documentation

## 2023-08-09 DIAGNOSIS — C772 Secondary and unspecified malignant neoplasm of intra-abdominal lymph nodes: Secondary | ICD-10-CM | POA: Insufficient documentation

## 2023-08-09 DIAGNOSIS — R197 Diarrhea, unspecified: Secondary | ICD-10-CM | POA: Insufficient documentation

## 2023-08-09 DIAGNOSIS — C786 Secondary malignant neoplasm of retroperitoneum and peritoneum: Secondary | ICD-10-CM | POA: Insufficient documentation

## 2023-08-09 DIAGNOSIS — K55059 Acute (reversible) ischemia of intestine, part and extent unspecified: Secondary | ICD-10-CM | POA: Diagnosis not present

## 2023-08-09 DIAGNOSIS — R109 Unspecified abdominal pain: Secondary | ICD-10-CM | POA: Diagnosis not present

## 2023-08-09 DIAGNOSIS — I2699 Other pulmonary embolism without acute cor pulmonale: Secondary | ICD-10-CM | POA: Insufficient documentation

## 2023-08-09 DIAGNOSIS — Z880 Allergy status to penicillin: Secondary | ICD-10-CM | POA: Diagnosis not present

## 2023-08-09 DIAGNOSIS — R5383 Other fatigue: Secondary | ICD-10-CM | POA: Insufficient documentation

## 2023-08-09 DIAGNOSIS — C181 Malignant neoplasm of appendix: Secondary | ICD-10-CM

## 2023-08-09 DIAGNOSIS — Z881 Allergy status to other antibiotic agents status: Secondary | ICD-10-CM | POA: Diagnosis not present

## 2023-08-09 DIAGNOSIS — F32A Depression, unspecified: Secondary | ICD-10-CM | POA: Diagnosis not present

## 2023-08-09 DIAGNOSIS — Z8589 Personal history of malignant neoplasm of other organs and systems: Secondary | ICD-10-CM | POA: Insufficient documentation

## 2023-08-09 DIAGNOSIS — M549 Dorsalgia, unspecified: Secondary | ICD-10-CM | POA: Diagnosis not present

## 2023-08-09 DIAGNOSIS — I749 Embolism and thrombosis of unspecified artery: Secondary | ICD-10-CM | POA: Insufficient documentation

## 2023-08-09 LAB — CBC WITH DIFFERENTIAL (CANCER CENTER ONLY)
Abs Immature Granulocytes: 0.01 10*3/uL (ref 0.00–0.07)
Basophils Absolute: 0.1 10*3/uL (ref 0.0–0.1)
Basophils Relative: 1 %
Eosinophils Absolute: 0.2 10*3/uL (ref 0.0–0.5)
Eosinophils Relative: 3 %
HCT: 39.3 % (ref 36.0–46.0)
Hemoglobin: 12.8 g/dL (ref 12.0–15.0)
Immature Granulocytes: 0 %
Lymphocytes Relative: 46 %
Lymphs Abs: 3 10*3/uL (ref 0.7–4.0)
MCH: 30.4 pg (ref 26.0–34.0)
MCHC: 32.6 g/dL (ref 30.0–36.0)
MCV: 93.3 fL (ref 80.0–100.0)
Monocytes Absolute: 0.5 10*3/uL (ref 0.1–1.0)
Monocytes Relative: 8 %
Neutro Abs: 2.7 10*3/uL (ref 1.7–7.7)
Neutrophils Relative %: 42 %
Platelet Count: 223 10*3/uL (ref 150–400)
RBC: 4.21 MIL/uL (ref 3.87–5.11)
RDW: 13.7 % (ref 11.5–15.5)
WBC Count: 6.5 10*3/uL (ref 4.0–10.5)
nRBC: 0 % (ref 0.0–0.2)

## 2023-08-09 LAB — CMP (CANCER CENTER ONLY)
ALT: 35 U/L (ref 0–44)
AST: 41 U/L (ref 15–41)
Albumin: 3.7 g/dL (ref 3.5–5.0)
Alkaline Phosphatase: 151 U/L — ABNORMAL HIGH (ref 38–126)
Anion gap: 10 (ref 5–15)
BUN: 19 mg/dL (ref 6–20)
CO2: 25 mmol/L (ref 22–32)
Calcium: 8.7 mg/dL — ABNORMAL LOW (ref 8.9–10.3)
Chloride: 104 mmol/L (ref 98–111)
Creatinine: 1.01 mg/dL — ABNORMAL HIGH (ref 0.44–1.00)
GFR, Estimated: >60 mL/min (ref >60)
Glucose, Bld: 189 mg/dL — ABNORMAL HIGH (ref 70–99)
Potassium: 3.6 mmol/L (ref 3.5–5.1)
Sodium: 139 mmol/L (ref 135–145)
Total Bilirubin: 0.4 mg/dL (ref 0.3–1.2)
Total Protein: 7 g/dL (ref 6.5–8.1)

## 2023-08-09 LAB — LACTATE DEHYDROGENASE: LDH: 209 U/L — ABNORMAL HIGH (ref 98–192)

## 2023-08-09 LAB — CEA (IN HOUSE-CHCC): CEA (CHCC-In House): 2.52 ng/mL (ref 0.00–5.00)

## 2023-08-09 MED ORDER — HEPARIN SOD (PORK) LOCK FLUSH 100 UNIT/ML IV SOLN
500.0000 [IU] | Freq: Once | INTRAVENOUS | Status: AC | PRN
  Administered 2023-08-09: 500 [IU]

## 2023-08-09 MED ORDER — SODIUM CHLORIDE 0.9% FLUSH
10.0000 mL | Freq: Once | INTRAVENOUS | Status: AC | PRN
  Administered 2023-08-09: 10 mL

## 2023-08-09 MED ORDER — CYANOCOBALAMIN 1000 MCG/ML IJ SOLN
1000.0000 ug | Freq: Once | INTRAMUSCULAR | Status: AC
  Administered 2023-08-09: 1000 ug via INTRAMUSCULAR
  Filled 2023-08-09: qty 1

## 2023-08-09 MED ORDER — ONDANSETRON HCL 8 MG PO TABS: 8.0000 mg | ORAL_TABLET | Freq: Three times a day (TID) | ORAL | 2 refills | Status: DC | PRN

## 2023-08-09 MED ORDER — VALACYCLOVIR HCL 1 G PO TABS: 1000.0000 mg | ORAL_TABLET | Freq: Two times a day (BID) | ORAL | 0 refills | Status: DC

## 2023-08-09 MED ORDER — LANREOTIDE ACETATE 120 MG/0.5ML ~~LOC~~ SOLN
120.0000 mg | Freq: Once | SUBCUTANEOUS | Status: AC
  Administered 2023-08-09: 120 mg via SUBCUTANEOUS
  Filled 2023-08-09: qty 120

## 2023-08-09 NOTE — Patient Instructions (Signed)
Lanreotide Injection What is this medication? LANREOTIDE (lan REE oh tide) treats high levels of growth hormone (acromegaly). It is used when other therapies have not worked well enough or cannot be tolerated. It works by reducing the amount of growth hormone your body makes. This reduces symptoms and the risk of health problems caused by too much growth hormone, such as diabetes and heart disease. It may also be used to treat neuroendocrine tumors, a cancer of the cells that release hormones and other substances in your body. It works by slowing down the release of these substances from the cells. This slows tumor growth. It also decreases the symptoms of carcinoid syndrome, such as flushing or diarrhea. This medicine may be used for other purposes; ask your health care provider or pharmacist if you have questions. COMMON BRAND NAME(S): Somatuline [place] What should I tell my care team before I take this medication? They need to know if you have any of these conditions: Diabetes Gallbladder disease Heart disease Kidney disease Liver disease Thyroid disease An unusual or allergic reaction to lanreotide, other medications, foods, dyes, or preservatives Pregnant or trying to get pregnant Breast-feeding How should I use this medication? This medication is injected under the skin. It is given by your care team in a hospital or clinic setting. Talk to your care team about the use of this medication in children. Special care may be needed. Overdosage: If you think you have taken too much of this medicine contact a poison control center or emergency room at once. NOTE: This medicine is only for you. Do not share this medicine with others. What if I miss a dose? Keep appointments for follow-up doses. It is important not to miss your dose. Call your care team if you are unable to keep an appointment. What may interact with this medication? Bromocriptine Cyclosporine Certain medications for blood  pressure, heart disease, irregular heartbeat Certain medications for diabetes Quinidine Terfenadine This list may not describe all possible interactions. Give your health care provider a list of all the medicines, herbs, non-prescription drugs, or dietary supplements you use. Also tell them if you smoke, drink alcohol, or use illegal drugs. Some items may interact with your medicine. What should I watch for while using this medication? Visit your care team for regular checks on your progress. Tell your care team if your symptoms do not start to get better or if they get worse. Your condition will be monitored carefully while you are receiving this medication. You may need blood work while you are taking this medication. This medication may increase blood sugar. The risk may be higher in patients who already have diabetes. Ask your care team what you can do to lower your risk of diabetes while taking this medication. Talk to your care team if you wish to become pregnant or think you may be pregnant. This medication can cause serious birth defects. Do not breast-feed while taking this medication and for 6 months after stopping therapy. This medication may cause infertility. Talk to your care team if you are concerned about your fertility. What side effects may I notice from receiving this medication? Side effects that you should report to your care team as soon as possible: Allergic reactions--skin rash, itching, hives, swelling of the face, lips, tongue, or throat Gallbladder problems--severe stomach pain, nausea, vomiting, fever High blood sugar (hyperglycemia)--increased thirst or amount of urine, unusual weakness or fatigue, blurry vision Increase in blood pressure Low blood sugar (hypoglycemia)--tremors or shaking, anxiety, sweating, cold   or clammy skin, confusion, dizziness, rapid heartbeat Low thyroid levels (hypothyroidism)--unusual weakness or fatigue, increased sensitivity to cold,  constipation, hair loss, dry skin, weight gain, feelings of depression Slow heartbeat--dizziness, feeling faint or lightheaded, confusion, trouble breathing, unusual weakness or fatigue Side effects that usually do not require medical attention (report to your care team if they continue or are bothersome): Diarrhea Dizziness Headache Muscle spasms Nausea Pain, redness, irritation, or bruising at the injection site Stomach pain This list may not describe all possible side effects. Call your doctor for medical advice about side effects. You may report side effects to FDA at 1-800-FDA-1088. Where should I keep my medication? This medication is given in a hospital or clinic. It will not be stored at home. NOTE: This sheet is a summary. It may not cover all possible information. If you have questions about this medicine, talk to your doctor, pharmacist, or health care provider.  2024 Elsevier/Gold Standard (2022-03-02 00:00:00)  

## 2023-08-09 NOTE — Progress Notes (Signed)
Hematology and Oncology Follow Up Visit  Wendy Barber [id number] 07/21/1961 62 y.o. 08/09/2023   Principle Diagnosis:  History of metastatic appendiceal cancer  -- recurrent  Superior mesenteric vein thrombus Thromboembolism of the RIGHT leg Pernicious anemia Acute pulmonary embolism-segmental right pulmonary artery Carcinoid/neuroendocrine carcinoma  Current Therapy:   HIPEC - Surgery done in [place] in 11/2020 Arixtra 10 mg subcu daily --start on 2/16 2024 Vitamin B12 5000 mcg PO daily  Somatuline 120 mg IM monthly-start on 07/12/2023     Interim History:  Wendy Barber is in for follow-up.  As expected, she has issues.  Looks like she may have shingles in the right first division of the right trigeminal nerve.  She has had this for about 5 days.  It is quite pruritic.  I do not see any obvious blisters.  However, it does look like it is shingles.  I will send in some Valtrex (1000 mg p.o. twice daily x 10 days)..  She is still having some discomfort over on the right side.  She has had this on and off.  She continues on the Arixtra for her recurrent thromboembolic disease.  She has had no bleeding.  Her last Chromogranin A level was 213.  She thought that the Somatuline might help her diarrhea.  After she had the last Somatuline, she did not have diarrhea for about 2 days.  However, it then came back.  Maybe, with continued Somatuline, the diarrhea will slow down.  She has had no fever.  There is been no cough.  Overall, I would say performance status is probably ECOG 2.   She  continues on Arixtra for her thromboembolic disease.  She has had no cough.  There is no chest wall pain.  There is no rashes.  She has had a little bit of leg swelling but again this is chronic.  Overall, I would say that her performance status is probably ECOG 2.    Medications:  Current Outpatient Medications:    amLODipine (NORVASC) 5 MG tablet, TAKE 1 TABLET(5 MG) BY MOUTH DAILY, Disp: 90  tablet, Rfl: 1   busPIRone (BUSPAR) 15 MG tablet, Take 1 tablet (15 mg total) by mouth 3 (three) times daily., Disp: 270 tablet, Rfl: 0   cabergoline (DOSTINEX) 0.5 MG tablet, Take 0.5 tablets (0.25 mg total) by mouth 2 (two) times a week., Disp: 12 tablet, Rfl: 2   citalopram (CELEXA) 40 MG tablet, Take 1 tablet (40 mg total) by mouth daily., Disp: 90 tablet, Rfl: 1   dicyclomine (BENTYL) 20 MG tablet, TAKE 1 TABLET(20 MG) BY MOUTH THREE TIMES DAILY BEFORE MEALS, Disp: 90 tablet, Rfl: 4   diphenoxylate-atropine (LOMOTIL) 2.5-0.025 MG tablet, Take two tablets by mouth 4 times a day as needed for loose stools or diarrhea., Disp: 240 tablet, Rfl: 0   eszopiclone (LUNESTA) 2 MG TABS tablet, TAKE 1 TABLET(2 MG) BY MOUTH AT BEDTIME AS NEEDED FOR SLEEP, Disp: 30 tablet, Rfl: 0   fondaparinux (ARIXTRA) 10 MG/0.8ML SOLN injection, Inject 0.8 mLs (10 mg total) into the skin daily., Disp: 72 mL, Rfl: 3   hydrocortisone (ANUSOL-HC) 2.5 % rectal cream, Place 1 Application rectally 4 (four) times daily as needed for hemorrhoids or anal itching., Disp: 30 g, Rfl: 1   HYDROmorphone (DILAUDID) 4 MG tablet, Take 1 tablet (4 mg total) by mouth every 6 (six) hours as needed for severe pain., Disp: 60 tablet, Rfl: 0   loperamide (IMODIUM) 2 MG capsule, Take 2 capsules (4 mg total)  by mouth 4 (four) times daily as needed for diarrhea or loose stools., Disp: , Rfl:    meclizine (ANTIVERT) 25 MG tablet, TAKE 1 TABLET(25 MG) BY MOUTH THREE TIMES DAILY AS NEEDED FOR DIZZINESS, Disp: 60 tablet, Rfl: 0   nystatin-triamcinolone ointment (MYCOLOG), Apply 1 Application topically 2 (two) times daily. For up to 7 days and then STOP.  Repeat as needed for flares after at least a 2 week break, Disp: 30 g, Rfl: 0   ondansetron (ZOFRAN) 8 MG tablet, Take 8 mg by mouth See admin instructions. Am yes and 2 day if neneced, Disp: , Rfl:    orphenadrine (NORFLEX) 100 MG tablet, TAKE 1 TABLET(100 MG) BY MOUTH AT BEDTIME AS NEEDED FOR MUSCLE  SPASMS, Disp: 30 tablet, Rfl: 2   pantoprazole (PROTONIX) 40 MG tablet, Take 1 tablet (40 mg total) by mouth daily before breakfast., Disp: 90 tablet, Rfl: 1   pramoxine-hydrocortisone (PROCTOCREAM-HC) 1-1 % rectal cream, Place 1 Application rectally 2 (two) times daily. (Patient taking differently: Place 1 Application rectally 2 (two) times daily as needed for hemorrhoids or anal itching (if hydrocortisone 2.5% cream is not available).), Disp: 30 g, Rfl: 3   promethazine (PHENERGAN) 12.5 MG tablet, TAKE 1 TABLET(12.5 MG) BY MOUTH TWICE DAILY AS NEEDED, Disp: 30 tablet, Rfl: 3   propranolol (INDERAL) 20 MG tablet, TAKE 1 TABLET(20 MG) BY MOUTH TWICE DAILY, Disp: 180 tablet, Rfl: 1   buPROPion (WELLBUTRIN XL) 300 MG 24 hr tablet, TAKE 1 TABLET(300 MG) BY MOUTH DAILY (Patient not taking: Reported on 04/17/2023), Disp: 90 tablet, Rfl: 0   cyanocobalamin 1000 MCG tablet, Take 1 tablet (1,000 mcg total) by mouth daily. (Patient not taking: Reported on 08/09/2023), Disp: , Rfl:    Dextromethorphan-buPROPion ER (AUVELITY) 45-105 MG TBCR, Take one tablet by mouth for 3 days, then take two tablet daily 7-8 hours between doses (Patient not taking: Reported on 06/23/2023), Disp: 60 tablet, Rfl: 1   diazepam (VALIUM) 5 MG tablet, Take 1 tab 30 minutes prior to enclosed imaging study. (Patient not taking: Reported on 06/23/2023), Disp: 4 tablet, Rfl: 1   ferrous sulfate 325 (65 FE) MG tablet, Take 1 tablet (325 mg total) by mouth daily., Disp: 30 tablet, Rfl: 3   ondansetron (ZOFRAN) 8 MG tablet, Take 1 tablet (8 mg total) by mouth every 8 (eight) hours as needed for nausea or vomiting., Disp: 30 tablet, Rfl: 2   Opium 10 MG/ML (1%) TINC, Take 0.6 mLs (6 mg total) by mouth every 3 (three) hours as needed for diarrhea or loose stools. (Patient not taking: Reported on 08/09/2023), Disp: 118 mL, Rfl: 0   potassium chloride (KLOR-CON M) 10 MEQ tablet, Take 2 tablets (20 mEq total) by mouth daily. (Patient not taking:  Reported on 04/17/2023), Disp: 60 tablet, Rfl: 1 No current facility-administered medications for this visit.  Facility-Administered Medications Ordered in Other Visits:    cyanocobalamin (VITAMIN B12) injection 1,000 mcg, 1,000 mcg, Intramuscular, Once, Marlyn Rabine, Rose Phi, MD   heparin lock flush 100 unit/mL, 500 Units, Intracatheter, Once PRN, Erenest Blank, NP   lanreotide acetate (SOMATULINE DEPOT) injection 120 mg, 120 mg, Subcutaneous, Once, Magdalena Skilton, Rose Phi, MD   sodium chloride flush (NS) 0.9 % injection 10 mL, 10 mL, Intracatheter, Once PRN, Erenest Blank, NP  Allergies:  Allergies  Allergen Reactions   Penicillins Shortness Of Breath and Other (See Comments)    Irregular and rapid Heart Rate, too    Alprazolam Hives and Other (See Comments)  Hard to arouse, unresponsiveness also   Ativan [Lorazepam] Swelling and Other (See Comments)    Face & Throat Swelling  Note: tolerates midazolam fine   Corticosteroids Other (See Comments)    "Psychotic behavior"    Erythromycin Other (See Comments)    Severe stomach pain    Gabapentin Other (See Comments)    Made the patient feel depressed   Savella  [Milnacipran] Other (See Comments)    Reaction not noted   Prednisolone Anxiety   Prednisone Anxiety and Other (See Comments)    "Anxiety & Nervous Breakdown"    Past Medical History, Surgical history, Social history, and Family History were reviewed and updated.  Review of Systems: Review of Systems  Constitutional:  Positive for fatigue and unexpected weight change.  HENT:  Negative.    Eyes: Negative.   Respiratory: Negative.    Cardiovascular: Negative.   Gastrointestinal:  Positive for abdominal pain and diarrhea.  Endocrine: Negative.   Genitourinary:  Positive for dysuria.   Musculoskeletal:  Positive for back pain.  Skin: Negative.   Neurological: Negative.   Hematological: Negative.   Psychiatric/Behavioral:  Positive for depression.     Physical  Exam: Vital signs show a temperature of 98.  Pulse 62.  Blood pressure 122/77.  Weight is 250 pounds.    Wt Readings from Last 3 Encounters:  08/09/23 250 lb (113.4 kg)  07/12/23 251 lb 1.9 oz (113.9 kg)  06/13/23 245 lb (111.1 kg)    Physical Exam Vitals reviewed.  HENT:     Head: Normocephalic and atraumatic.  Eyes:     Pupils: Pupils are equal, round, and reactive to light.  Cardiovascular:     Rate and Rhythm: Normal rate and regular rhythm.     Heart sounds: Normal heart sounds.  Pulmonary:     Effort: Pulmonary effort is normal.     Breath sounds: Normal breath sounds.  Abdominal:     General: Bowel sounds are normal.     Palpations: Abdomen is soft.     Comments: Abdominal exam shows healed laparotomy scar.  She has no fluid wave.  There is no guarding or rebound tenderness.  She has decent bowel sounds.  There is no palpable liver or spleen tip.  Musculoskeletal:        General: No tenderness or deformity. Normal range of motion.     Cervical back: Normal range of motion.  Lymphadenopathy:     Cervical: No cervical adenopathy.  Skin:    General: Skin is warm and dry.     Findings: No erythema or rash.  Neurological:     Mental Status: She is alert and oriented to person, place, and time.  Psychiatric:        Behavior: Behavior normal.        Thought Content: Thought content normal.        Judgment: Judgment normal.      Lab Results  Component Value Date   WBC 6.5 08/09/2023   HGB 12.8 08/09/2023   HCT 39.3 08/09/2023   MCV 93.3 08/09/2023   PLT 223 08/09/2023     Chemistry      Component Value Date/Time   NA 139 08/09/2023 0950   NA 137 04/24/2023 1437   K 3.6 08/09/2023 0950   CL 104 08/09/2023 0950   CO2 25 08/09/2023 0950   BUN 19 08/09/2023 0950   BUN 18 04/24/2023 1437   CREATININE 1.01 (H) 08/09/2023 0950      Component Value  Date/Time   CALCIUM 8.7 (L) 08/09/2023 0950   ALKPHOS 151 (H) 08/09/2023 0950   AST 41 08/09/2023 0950   ALT 35  08/09/2023 0950   BILITOT 0.4 08/09/2023 0950      Impression and Plan: Ms. Harshman is a very charming 62 year-old white female.  She has an incredible history.  She has had problems with recurrent appendiceal cancer.  She underwent a HIPEC procedure in [place]  a couple years ago.  She has had problems from that procedure.  She has had horrible diarrhea.  She has had C. difficile.  Despite C. difficile being treated, she still has diarrhea.  I cannot imagine that the appendiceal cancer is a problem given the fact that she has a normal CEA.  Again, given the high Chromogranin A level, I really have to think that treating her for carcinoid would be a reasonable way to try to help her.  I forgot to mention that she had to see a Neuro-ophthalmologist.  I am not sure exactly what the problem with this is.  It sounds like she also has cataracts.  It sounds like she will need cataract surgery.  We will go ahead with the Somatuline.  I will plan to get her back in 1 month.  Again maybe, we can see the diarrhea improving.    [name], MD 10/16/202411:02 AM

## 2023-08-09 NOTE — Patient Instructions (Signed)

## 2023-08-11 ENCOUNTER — Telehealth: Payer: Self-pay | Admitting: *Deleted

## 2023-08-11 LAB — CHROMOGRANIN A: Chromogranin A (ng/mL): 49.2 ng/mL (ref 0.0–101.8)

## 2023-08-11 NOTE — Telephone Encounter (Signed)
-----   Message from [name] sent at 08/11/2023  6:59 AM EDT ----- Please call and let her know that the chromogranin A is now down to 49.  Great job.  Hopefully the diarrhea will get better.  Thanks.  Cindee Lame

## 2023-08-15 ENCOUNTER — Telehealth: Payer: Self-pay | Admitting: Family Medicine

## 2023-08-15 NOTE — Telephone Encounter (Signed)
Message left with approval for change in treatments.

## 2023-08-15 NOTE — Telephone Encounter (Signed)
Marcelino Duster, OT from [facility], calling to get verbal approval to move visits from 10/24 to 10/31 due to multiple doctor appointments She stated her call back number is [phone] and it is a secure line to leave a message

## 2023-08-16 ENCOUNTER — Encounter (INDEPENDENT_AMBULATORY_CARE_PROVIDER_SITE_OTHER): Payer: Self-pay | Admitting: Ophthalmology

## 2023-08-17 ENCOUNTER — Telehealth: Payer: Self-pay | Admitting: Family Medicine

## 2023-08-17 NOTE — Telephone Encounter (Signed)
Home Health Verbal Orders  Agency: [facility]  Caller: Durene Romans  Call back #: [phone] Fax #:[phone]    Requesting OT:    Reason for Request:    Frequency:     Charge sheet attached and placed in front bin.

## 2023-08-17 NOTE — Telephone Encounter (Signed)
Placed in your sign folder  

## 2023-08-17 NOTE — Telephone Encounter (Signed)
Paperwork completed and placed in fax bin at back nurse station

## 2023-08-19 ENCOUNTER — Other Ambulatory Visit: Payer: Self-pay | Admitting: Hematology & Oncology

## 2023-08-19 DIAGNOSIS — C181 Malignant neoplasm of appendix: Secondary | ICD-10-CM

## 2023-08-19 DIAGNOSIS — A0472 Enterocolitis due to Clostridium difficile, not specified as recurrent: Secondary | ICD-10-CM

## 2023-08-19 DIAGNOSIS — Z85038 Personal history of other malignant neoplasm of large intestine: Secondary | ICD-10-CM

## 2023-08-22 ENCOUNTER — Ambulatory Visit: Payer: Medicare Other | Admitting: Internal Medicine

## 2023-08-24 ENCOUNTER — Other Ambulatory Visit: Payer: Self-pay | Admitting: Behavioral Health

## 2023-08-24 ENCOUNTER — Telehealth: Payer: Self-pay | Admitting: Family Medicine

## 2023-08-24 DIAGNOSIS — F5105 Insomnia due to other mental disorder: Secondary | ICD-10-CM

## 2023-08-24 NOTE — Telephone Encounter (Signed)
Okay for verbal orders. 

## 2023-08-24 NOTE — Telephone Encounter (Signed)
Currently under the care of home health.  Per message appears she has multiple episodes of diarrhea and abdominal pain today.  History of appendiceal carcinoma with some intermittent abdominal pain, diarrhea previously, recent oncology visit October 16.  Have her symptoms acutely changed?  It appears she was treated with a medication some Madelene to help with diarrhea, but she has also had C. difficile colitis in the past.  Please call and check status.

## 2023-08-24 NOTE — Telephone Encounter (Signed)
Home Health   Agency:  [facility]  Caller: Rosalita Chessman Call back #: [phone] Fax #:    Requesting OT:    Reason for Request:    Multiple episodes of diarrhea 10 x times today. Stomach pain 6/10 level.

## 2023-08-24 NOTE — Telephone Encounter (Signed)
Due 11/6

## 2023-08-25 NOTE — Telephone Encounter (Signed)
Wendy Barber notes she has had worsened symptoms any time she eats her stomach becomes painful and it is not long before she then has to go to the bathroom, pt reports not feeling herself and just uncomfortable but thinks it was just a bad day for her will call us later today if it continues.

## 2023-08-28 ENCOUNTER — Telehealth: Payer: Self-pay | Admitting: Family Medicine

## 2023-08-28 NOTE — Telephone Encounter (Signed)
[facility] OT calling to request 2nd OT visit this week and then once a week for 2 weeks Okay to give verbal orders on the phone, can leave voicemail if needed Over the weekend, rt ankle sounded like it cracked pt feels like it's broken, states that now hurts when walking, no swelling or bruising noted

## 2023-08-28 NOTE — Telephone Encounter (Signed)
Sent to Dr.Greene Please advise.

## 2023-08-28 NOTE — Telephone Encounter (Signed)
Okay for OT visit orders, verbal orders are okay. However I am concerned about the right ankle injury and symptoms.  Recommend in office evaluation or urgent care/ER  eval if needed.

## 2023-08-28 NOTE — Telephone Encounter (Signed)
Plan noted on 11/1 - late entry. Thanks.

## 2023-08-29 ENCOUNTER — Encounter: Payer: Self-pay | Admitting: Family Medicine

## 2023-08-29 NOTE — Telephone Encounter (Signed)
Pt has been scheduled on 08/30/23 with Dr Beverely Low for evaluation of the ankle sent to Beverely Low so she has this information as well

## 2023-08-29 NOTE — Telephone Encounter (Signed)
Wendy Barber has been provided with the verbal orders

## 2023-08-29 NOTE — Telephone Encounter (Signed)
Noted.  Thank you and appreciate Dr. Beverely Low seeing her.

## 2023-08-30 ENCOUNTER — Ambulatory Visit: Payer: Medicare Other | Admitting: Family Medicine

## 2023-08-30 ENCOUNTER — Telehealth: Payer: Self-pay | Admitting: Family Medicine

## 2023-08-30 NOTE — Telephone Encounter (Signed)
Pt missed visit   Placed in front bin

## 2023-08-30 NOTE — Telephone Encounter (Signed)
This is a missed visit notification for . Placed in folder at Nurse Visit.

## 2023-09-01 NOTE — Telephone Encounter (Signed)
Paperwork completed and placed in fax bin at back nurse station

## 2023-09-02 ENCOUNTER — Other Ambulatory Visit: Payer: Self-pay | Admitting: Hematology & Oncology

## 2023-09-02 DIAGNOSIS — H8112 Benign paroxysmal vertigo, left ear: Secondary | ICD-10-CM

## 2023-09-03 ENCOUNTER — Encounter: Payer: Self-pay | Admitting: Hematology & Oncology

## 2023-09-05 ENCOUNTER — Other Ambulatory Visit: Payer: Self-pay

## 2023-09-05 ENCOUNTER — Inpatient Hospital Stay: Payer: Medicare Other

## 2023-09-05 ENCOUNTER — Encounter: Payer: Self-pay | Admitting: Hematology & Oncology

## 2023-09-05 ENCOUNTER — Inpatient Hospital Stay: Payer: Medicare Other | Attending: Hematology & Oncology

## 2023-09-05 ENCOUNTER — Inpatient Hospital Stay (HOSPITAL_BASED_OUTPATIENT_CLINIC_OR_DEPARTMENT_OTHER): Payer: Medicare Other | Admitting: Hematology & Oncology

## 2023-09-05 VITALS — BP 145/90 | HR 70 | Temp 97.9°F | Resp 18 | Ht 66.0 in | Wt 250.0 lb

## 2023-09-05 DIAGNOSIS — Z79624 Long term (current) use of inhibitors of nucleotide synthesis: Secondary | ICD-10-CM | POA: Diagnosis not present

## 2023-09-05 DIAGNOSIS — K55059 Acute (reversible) ischemia of intestine, part and extent unspecified: Secondary | ICD-10-CM | POA: Diagnosis not present

## 2023-09-05 DIAGNOSIS — C772 Secondary and unspecified malignant neoplasm of intra-abdominal lymph nodes: Secondary | ICD-10-CM | POA: Diagnosis not present

## 2023-09-05 DIAGNOSIS — C181 Malignant neoplasm of appendix: Secondary | ICD-10-CM | POA: Insufficient documentation

## 2023-09-05 DIAGNOSIS — R197 Diarrhea, unspecified: Secondary | ICD-10-CM | POA: Diagnosis not present

## 2023-09-05 DIAGNOSIS — R300 Dysuria: Secondary | ICD-10-CM | POA: Diagnosis not present

## 2023-09-05 DIAGNOSIS — R945 Abnormal results of liver function studies: Secondary | ICD-10-CM | POA: Diagnosis not present

## 2023-09-05 DIAGNOSIS — Z880 Allergy status to penicillin: Secondary | ICD-10-CM | POA: Diagnosis not present

## 2023-09-05 DIAGNOSIS — M549 Dorsalgia, unspecified: Secondary | ICD-10-CM | POA: Diagnosis not present

## 2023-09-05 DIAGNOSIS — D510 Vitamin B12 deficiency anemia due to intrinsic factor deficiency: Secondary | ICD-10-CM | POA: Insufficient documentation

## 2023-09-05 DIAGNOSIS — Z881 Allergy status to other antibiotic agents status: Secondary | ICD-10-CM | POA: Insufficient documentation

## 2023-09-05 DIAGNOSIS — R5383 Other fatigue: Secondary | ICD-10-CM | POA: Insufficient documentation

## 2023-09-05 DIAGNOSIS — Z888 Allergy status to other drugs, medicaments and biological substances status: Secondary | ICD-10-CM | POA: Insufficient documentation

## 2023-09-05 DIAGNOSIS — I2699 Other pulmonary embolism without acute cor pulmonale: Secondary | ICD-10-CM | POA: Diagnosis not present

## 2023-09-05 DIAGNOSIS — Z7901 Long term (current) use of anticoagulants: Secondary | ICD-10-CM | POA: Diagnosis not present

## 2023-09-05 DIAGNOSIS — F32A Depression, unspecified: Secondary | ICD-10-CM | POA: Insufficient documentation

## 2023-09-05 DIAGNOSIS — L299 Pruritus, unspecified: Secondary | ICD-10-CM | POA: Diagnosis not present

## 2023-09-05 DIAGNOSIS — Z79899 Other long term (current) drug therapy: Secondary | ICD-10-CM | POA: Insufficient documentation

## 2023-09-05 DIAGNOSIS — F064 Anxiety disorder due to known physiological condition: Secondary | ICD-10-CM

## 2023-09-05 DIAGNOSIS — C786 Secondary malignant neoplasm of retroperitoneum and peritoneum: Secondary | ICD-10-CM

## 2023-09-05 DIAGNOSIS — R109 Unspecified abdominal pain: Secondary | ICD-10-CM | POA: Insufficient documentation

## 2023-09-05 DIAGNOSIS — R110 Nausea: Secondary | ICD-10-CM | POA: Diagnosis not present

## 2023-09-05 LAB — CBC WITH DIFFERENTIAL (CANCER CENTER ONLY)
Abs Immature Granulocytes: 0.01 10*3/uL (ref 0.00–0.07)
Basophils Absolute: 0.1 10*3/uL (ref 0.0–0.1)
Basophils Relative: 1 %
Eosinophils Absolute: 0.2 10*3/uL (ref 0.0–0.5)
Eosinophils Relative: 3 %
HCT: 38.4 % (ref 36.0–46.0)
Hemoglobin: 12.7 g/dL (ref 12.0–15.0)
Immature Granulocytes: 0 %
Lymphocytes Relative: 46 %
Lymphs Abs: 3 10*3/uL (ref 0.7–4.0)
MCH: 31.1 pg (ref 26.0–34.0)
MCHC: 33.1 g/dL (ref 30.0–36.0)
MCV: 93.9 fL (ref 80.0–100.0)
Monocytes Absolute: 0.5 10*3/uL (ref 0.1–1.0)
Monocytes Relative: 7 %
Neutro Abs: 2.8 10*3/uL (ref 1.7–7.7)
Neutrophils Relative %: 43 %
Platelet Count: 193 10*3/uL (ref 150–400)
RBC: 4.09 MIL/uL (ref 3.87–5.11)
RDW: 14.5 % (ref 11.5–15.5)
WBC Count: 6.6 10*3/uL (ref 4.0–10.5)
nRBC: 0 % (ref 0.0–0.2)

## 2023-09-05 LAB — CMP (CANCER CENTER ONLY)
ALT: 79 U/L — ABNORMAL HIGH (ref 0–44)
AST: 93 U/L — ABNORMAL HIGH (ref 15–41)
Albumin: 4.1 g/dL (ref 3.5–5.0)
Alkaline Phosphatase: 174 U/L — ABNORMAL HIGH (ref 38–126)
Anion gap: 8 (ref 5–15)
BUN: 13 mg/dL (ref 6–20)
CO2: 30 mmol/L (ref 22–32)
Calcium: 9.4 mg/dL (ref 8.9–10.3)
Chloride: 102 mmol/L (ref 98–111)
Creatinine: 1.07 mg/dL — ABNORMAL HIGH (ref 0.44–1.00)
GFR, Estimated: 59 mL/min — ABNORMAL LOW (ref >60)
Glucose, Bld: 186 mg/dL — ABNORMAL HIGH (ref 70–99)
Potassium: 3.8 mmol/L (ref 3.5–5.1)
Sodium: 140 mmol/L (ref 135–145)
Total Bilirubin: 0.7 mg/dL (ref <1.2)
Total Protein: 7.2 g/dL (ref 6.5–8.1)

## 2023-09-05 LAB — IRON AND IRON BINDING CAPACITY (CC-WL,HP ONLY)
Iron: 88 ug/dL (ref 28–170)
Saturation Ratios: 24 % (ref 10.4–31.8)
TIBC: 364 ug/dL (ref 250–450)
UIBC: 276 ug/dL (ref 148–442)

## 2023-09-05 LAB — CEA (IN HOUSE-CHCC): CEA (CHCC-In House): 3.29 ng/mL (ref 0.00–5.00)

## 2023-09-05 LAB — FERRITIN: Ferritin: 73 ng/mL (ref 11–307)

## 2023-09-05 MED ORDER — SODIUM CHLORIDE 0.9% FLUSH
10.0000 mL | Freq: Once | INTRAVENOUS | Status: AC | PRN
  Administered 2023-09-05: 10 mL

## 2023-09-05 MED ORDER — PROMETHAZINE HCL 12.5 MG PO TABS: 12.5000 mg | ORAL_TABLET | Freq: Four times a day (QID) | ORAL | 3 refills | Status: DC | PRN

## 2023-09-05 MED ORDER — LANREOTIDE ACETATE 120 MG/0.5ML ~~LOC~~ SOLN
120.0000 mg | Freq: Once | SUBCUTANEOUS | Status: AC
  Administered 2023-09-05: 120 mg via SUBCUTANEOUS
  Filled 2023-09-05: qty 120

## 2023-09-05 MED ORDER — HEPARIN SOD (PORK) LOCK FLUSH 100 UNIT/ML IV SOLN
500.0000 [IU] | Freq: Once | INTRAVENOUS | Status: AC | PRN
  Administered 2023-09-05: 500 [IU]

## 2023-09-05 MED ORDER — HYDROMORPHONE HCL 4 MG PO TABS: 4.0000 mg | ORAL_TABLET | Freq: Four times a day (QID) | ORAL | 0 refills | Status: DC | PRN

## 2023-09-05 NOTE — Patient Instructions (Signed)

## 2023-09-05 NOTE — Patient Instructions (Signed)
Lanreotide Injection What is this medication? LANREOTIDE (lan REE oh tide) treats high levels of growth hormone (acromegaly). It is used when other therapies have not worked well enough or cannot be tolerated. It works by reducing the amount of growth hormone your body makes. This reduces symptoms and the risk of health problems caused by too much growth hormone, such as diabetes and heart disease. It may also be used to treat neuroendocrine tumors, a cancer of the cells that release hormones and other substances in your body. It works by slowing down the release of these substances from the cells. This slows tumor growth. It also decreases the symptoms of carcinoid syndrome, such as flushing or diarrhea. This medicine may be used for other purposes; ask your health care provider or pharmacist if you have questions. COMMON BRAND NAME(S): Somatuline [place] What should I tell my care team before I take this medication? They need to know if you have any of these conditions: Diabetes Gallbladder disease Heart disease Kidney disease Liver disease Thyroid disease An unusual or allergic reaction to lanreotide, other medications, foods, dyes, or preservatives Pregnant or trying to get pregnant Breast-feeding How should I use this medication? This medication is injected under the skin. It is given by your care team in a hospital or clinic setting. Talk to your care team about the use of this medication in children. Special care may be needed. Overdosage: If you think you have taken too much of this medicine contact a poison control center or emergency room at once. NOTE: This medicine is only for you. Do not share this medicine with others. What if I miss a dose? Keep appointments for follow-up doses. It is important not to miss your dose. Call your care team if you are unable to keep an appointment. What may interact with this medication? Bromocriptine Cyclosporine Certain medications for blood  pressure, heart disease, irregular heartbeat Certain medications for diabetes Quinidine Terfenadine This list may not describe all possible interactions. Give your health care provider a list of all the medicines, herbs, non-prescription drugs, or dietary supplements you use. Also tell them if you smoke, drink alcohol, or use illegal drugs. Some items may interact with your medicine. What should I watch for while using this medication? Visit your care team for regular checks on your progress. Tell your care team if your symptoms do not start to get better or if they get worse. Your condition will be monitored carefully while you are receiving this medication. You may need blood work while you are taking this medication. This medication may increase blood sugar. The risk may be higher in patients who already have diabetes. Ask your care team what you can do to lower your risk of diabetes while taking this medication. Talk to your care team if you wish to become pregnant or think you may be pregnant. This medication can cause serious birth defects. Do not breast-feed while taking this medication and for 6 months after stopping therapy. This medication may cause infertility. Talk to your care team if you are concerned about your fertility. What side effects may I notice from receiving this medication? Side effects that you should report to your care team as soon as possible: Allergic reactions--skin rash, itching, hives, swelling of the face, lips, tongue, or throat Gallbladder problems--severe stomach pain, nausea, vomiting, fever High blood sugar (hyperglycemia)--increased thirst or amount of urine, unusual weakness or fatigue, blurry vision Increase in blood pressure Low blood sugar (hypoglycemia)--tremors or shaking, anxiety, sweating, cold   or clammy skin, confusion, dizziness, rapid heartbeat Low thyroid levels (hypothyroidism)--unusual weakness or fatigue, increased sensitivity to cold,  constipation, hair loss, dry skin, weight gain, feelings of depression Slow heartbeat--dizziness, feeling faint or lightheaded, confusion, trouble breathing, unusual weakness or fatigue Side effects that usually do not require medical attention (report to your care team if they continue or are bothersome): Diarrhea Dizziness Headache Muscle spasms Nausea Pain, redness, irritation, or bruising at the injection site Stomach pain This list may not describe all possible side effects. Call your doctor for medical advice about side effects. You may report side effects to FDA at 1-800-FDA-1088. Where should I keep my medication? This medication is given in a hospital or clinic. It will not be stored at home. NOTE: This sheet is a summary. It may not cover all possible information. If you have questions about this medicine, talk to your doctor, pharmacist, or health care provider.  2024 Elsevier/Gold Standard (2022-03-02 00:00:00)  

## 2023-09-05 NOTE — Progress Notes (Signed)
Hematology and Oncology Follow Up Visit  Wendy Barber [id number] Oct 23, 1961 62 y.o. 09/05/2023   Principle Diagnosis:  History of metastatic appendiceal cancer  -- recurrent  Superior mesenteric vein thrombus Thromboembolism of the RIGHT leg Pernicious anemia Acute pulmonary embolism-segmental right pulmonary artery Carcinoid/neuroendocrine carcinoma  Current Therapy:   HIPEC - Surgery done in [place] in 11/2020 Arixtra 10 mg subcu daily --start on 2/16 2024 Vitamin B12 5000 mcg PO daily  Somatuline 120 mg IM monthly-start on 07/12/2023     Interim History:  Wendy Barber is in for follow-up.  She is still having diarrhea.  No matter what, I do think she will always have an element of diarrhea.  I suspect this probably is secondary to her past surgeries.  She is says she is having quite a bit of itching.  I am not sure as to why she would have the itching.  I am unsure of the Somatuline would be doing this.  She does have some elevated liver function studies.  I Not sure as to why this would be elevated.  Again I do not know of the Somatuline I be a factor.  Had to get a ultrasound of her right upper quadrant.  She does not have a gallbladder so this would not be a problem.  Her Chromogranin A level has come down quite nicely.  When we last checked, the level was down to 49.  This was a nice decrease from her level in September of 214.  She is having no problems with increased cough or shortness of breath.  Patient continues on the Arixtra.  I think she is doing okay on the Arixtra.  There has been no problems with fever.  She has had no obvious bleeding.  We did iron studies on her today.  Iron saturation was 24%.  Overall, I would have said that her performance status is probably ECOG 2.   Medications:  Current Outpatient Medications:    amLODipine (NORVASC) 5 MG tablet, TAKE 1 TABLET(5 MG) BY MOUTH DAILY, Disp: 90 tablet, Rfl: 1   buPROPion (WELLBUTRIN XL) 300 MG 24  hr tablet, TAKE 1 TABLET(300 MG) BY MOUTH DAILY (Patient not taking: Reported on 04/17/2023), Disp: 90 tablet, Rfl: 0   busPIRone (BUSPAR) 15 MG tablet, Take 1 tablet (15 mg total) by mouth 3 (three) times daily., Disp: 270 tablet, Rfl: 0   cabergoline (DOSTINEX) 0.5 MG tablet, Take 0.5 tablets (0.25 mg total) by mouth 2 (two) times a week., Disp: 12 tablet, Rfl: 2   citalopram (CELEXA) 40 MG tablet, Take 1 tablet (40 mg total) by mouth daily., Disp: 90 tablet, Rfl: 1   cyanocobalamin 1000 MCG tablet, Take 1 tablet (1,000 mcg total) by mouth daily. (Patient not taking: Reported on 08/09/2023), Disp: , Rfl:    Dextromethorphan-buPROPion ER (AUVELITY) 45-105 MG TBCR, Take one tablet by mouth for 3 days, then take two tablet daily 7-8 hours between doses (Patient not taking: Reported on 06/23/2023), Disp: 60 tablet, Rfl: 1   diazepam (VALIUM) 5 MG tablet, Take 1 tab 30 minutes prior to enclosed imaging study. (Patient not taking: Reported on 06/23/2023), Disp: 4 tablet, Rfl: 1   dicyclomine (BENTYL) 20 MG tablet, TAKE 1 TABLET(20 MG) BY MOUTH THREE TIMES DAILY BEFORE MEALS, Disp: 90 tablet, Rfl: 4   diphenoxylate-atropine (LOMOTIL) 2.5-0.025 MG tablet, TAKE 2 TABLETS BY MOUTH FOUR TIMES DAILY AS NEEDED FOR LOOSE STOOLS OR DIARRHEA, Disp: 240 tablet, Rfl: 0   eszopiclone (LUNESTA) 2 MG TABS tablet,  TAKE 1 TABLET(2 MG) BY MOUTH AT BEDTIME AS NEEDED FOR SLEEP, Disp: 30 tablet, Rfl: 1   ferrous sulfate 325 (65 FE) MG tablet, Take 1 tablet (325 mg total) by mouth daily., Disp: 30 tablet, Rfl: 3   fondaparinux (ARIXTRA) 10 MG/0.8ML SOLN injection, Inject 0.8 mLs (10 mg total) into the skin daily., Disp: 72 mL, Rfl: 3   hydrocortisone (ANUSOL-HC) 2.5 % rectal cream, Place 1 Application rectally 4 (four) times daily as needed for hemorrhoids or anal itching., Disp: 30 g, Rfl: 1   HYDROmorphone (DILAUDID) 4 MG tablet, Take 1 tablet (4 mg total) by mouth every 6 (six) hours as needed for severe pain., Disp: 60 tablet,  Rfl: 0   loperamide (IMODIUM) 2 MG capsule, Take 2 capsules (4 mg total) by mouth 4 (four) times daily as needed for diarrhea or loose stools., Disp: , Rfl:    meclizine (ANTIVERT) 25 MG tablet, TAKE 1 TABLET(25 MG) BY MOUTH THREE TIMES DAILY AS NEEDED FOR DIZZINESS, Disp: 60 tablet, Rfl: 0   nystatin-triamcinolone ointment (MYCOLOG), Apply 1 Application topically 2 (two) times daily. For up to 7 days and then STOP.  Repeat as needed for flares after at least a 2 week break, Disp: 30 g, Rfl: 0   ondansetron (ZOFRAN) 8 MG tablet, Take 8 mg by mouth See admin instructions. Am yes and 2 day if neneced, Disp: , Rfl:    ondansetron (ZOFRAN) 8 MG tablet, Take 1 tablet (8 mg total) by mouth every 8 (eight) hours as needed for nausea or vomiting., Disp: 30 tablet, Rfl: 2   Opium 10 MG/ML (1%) TINC, Take 0.6 mLs (6 mg total) by mouth every 3 (three) hours as needed for diarrhea or loose stools. (Patient not taking: Reported on 08/09/2023), Disp: 118 mL, Rfl: 0   orphenadrine (NORFLEX) 100 MG tablet, TAKE 1 TABLET(100 MG) BY MOUTH AT BEDTIME AS NEEDED FOR MUSCLE SPASMS, Disp: 30 tablet, Rfl: 2   pantoprazole (PROTONIX) 40 MG tablet, Take 1 tablet (40 mg total) by mouth daily before breakfast., Disp: 90 tablet, Rfl: 1   potassium chloride (KLOR-CON M) 10 MEQ tablet, Take 2 tablets (20 mEq total) by mouth daily. (Patient not taking: Reported on 04/17/2023), Disp: 60 tablet, Rfl: 1   pramoxine-hydrocortisone (PROCTOCREAM-HC) 1-1 % rectal cream, Place 1 Application rectally 2 (two) times daily. (Patient taking differently: Place 1 Application rectally 2 (two) times daily as needed for hemorrhoids or anal itching (if hydrocortisone 2.5% cream is not available).), Disp: 30 g, Rfl: 3   promethazine (PHENERGAN) 12.5 MG tablet, TAKE 1 TABLET(12.5 MG) BY MOUTH TWICE DAILY AS NEEDED, Disp: 30 tablet, Rfl: 3   propranolol (INDERAL) 20 MG tablet, TAKE 1 TABLET(20 MG) BY MOUTH TWICE DAILY, Disp: 180 tablet, Rfl: 1    valACYclovir (VALTREX) 1000 MG tablet, Take 1 tablet (1,000 mg total) by mouth 2 (two) times daily., Disp: 20 tablet, Rfl: 0 No current facility-administered medications for this visit.  Facility-Administered Medications Ordered in Other Visits:    heparin lock flush 100 unit/mL, 500 Units, Intracatheter, Once PRN, Erenest Blank, NP   sodium chloride flush (NS) 0.9 % injection 10 mL, 10 mL, Intracatheter, Once PRN, Erenest Blank, NP  Allergies:  Allergies  Allergen Reactions   Penicillins Shortness Of Breath and Other (See Comments)    Irregular and rapid Heart Rate, too    Alprazolam Hives and Other (See Comments)    Hard to arouse, unresponsiveness also   Ativan [Lorazepam] Swelling and Other (See  Comments)    Face & Throat Swelling  Note: tolerates midazolam fine   Corticosteroids Other (See Comments)    "Psychotic behavior"    Erythromycin Other (See Comments)    Severe stomach pain    Gabapentin Other (See Comments)    Made the patient feel depressed   Savella  [Milnacipran] Other (See Comments)    Reaction not noted   Prednisolone Anxiety   Prednisone Anxiety and Other (See Comments)    "Anxiety & Nervous Breakdown"    Past Medical History, Surgical history, Social history, and Family History were reviewed and updated.  Review of Systems: Review of Systems  Constitutional:  Positive for fatigue and unexpected weight change.  HENT:  Negative.    Eyes: Negative.   Respiratory: Negative.    Cardiovascular: Negative.   Gastrointestinal:  Positive for abdominal pain and diarrhea.  Endocrine: Negative.   Genitourinary:  Positive for dysuria.   Musculoskeletal:  Positive for back pain.  Skin: Negative.   Neurological: Negative.   Hematological: Negative.   Psychiatric/Behavioral:  Positive for depression.     Physical Exam: Vital signs show a temperature of 97.9.  Pulse 70.  Blood pressure 145/90.  Weight is 250 pounds.     Wt Readings from Last 3 Encounters:   09/05/23 250 lb (113.4 kg)  08/09/23 250 lb (113.4 kg)  07/12/23 251 lb 1.9 oz (113.9 kg)    Physical Exam Vitals reviewed.  HENT:     Head: Normocephalic and atraumatic.  Eyes:     Pupils: Pupils are equal, round, and reactive to light.  Cardiovascular:     Rate and Rhythm: Normal rate and regular rhythm.     Heart sounds: Normal heart sounds.  Pulmonary:     Effort: Pulmonary effort is normal.     Breath sounds: Normal breath sounds.  Abdominal:     General: Bowel sounds are normal.     Palpations: Abdomen is soft.     Comments: Abdominal exam shows healed laparotomy scar.  She has no fluid wave.  There is no guarding or rebound tenderness.  She has decent bowel sounds.  There is no palpable liver or spleen tip.  Musculoskeletal:        General: No tenderness or deformity. Normal range of motion.     Cervical back: Normal range of motion.  Lymphadenopathy:     Cervical: No cervical adenopathy.  Skin:    General: Skin is warm and dry.     Findings: No erythema or rash.  Neurological:     Mental Status: She is alert and oriented to person, place, and time.  Psychiatric:        Behavior: Behavior normal.        Thought Content: Thought content normal.        Judgment: Judgment normal.     Lab Results  Component Value Date   WBC 6.6 09/05/2023   HGB 12.7 09/05/2023   HCT 38.4 09/05/2023   MCV 93.9 09/05/2023   PLT 193 09/05/2023     Chemistry      Component Value Date/Time   NA 139 08/09/2023 0950   NA 137 04/24/2023 1437   K 3.6 08/09/2023 0950   CL 104 08/09/2023 0950   CO2 25 08/09/2023 0950   BUN 19 08/09/2023 0950   BUN 18 04/24/2023 1437   CREATININE 1.01 (H) 08/09/2023 0950      Component Value Date/Time   CALCIUM 8.7 (L) 08/09/2023 0950   ALKPHOS 151 (H)  08/09/2023 0950   AST 41 08/09/2023 0950   ALT 35 08/09/2023 0950   BILITOT 0.4 08/09/2023 0950      Impression and Plan: Ms. Ellstrom is a very charming 62 year-old white female.  She has  an incredible history.  She has had problems with recurrent appendiceal cancer.  She underwent a HIPEC procedure in [place]  a couple years ago.  She has had problems from that procedure.  She has had horrible diarrhea.  She has had C. difficile.  Despite C. difficile being treated, she still has diarrhea.  I cannot imagine that the appendiceal cancer is a problem given the fact that she has a normal CEA.  Again, given the high Chromogranin A level, I really have to think that treating her for carcinoid would be a reasonable way to try to help her.  She is on Somatuline.  We have her Chromogranin A level that was down.  We will see what it is today.  I thought that this might be able to help with her diarrhea but I guess it probably has not.  Recommend that she can have cataract surgery this Friday and then I think in 2 weeks.  I do not see a problem with her having cataract surgery as her blood counts are okay.   I will plan to get her back in 1 month.     [name], MD 11/12/202410:00 AM

## 2023-09-06 ENCOUNTER — Telehealth: Payer: Self-pay | Admitting: Family Medicine

## 2023-09-06 NOTE — Telephone Encounter (Signed)
Received forms from [facility] Printed & placed in provider bin Order # [id number]

## 2023-09-06 NOTE — Telephone Encounter (Signed)
Paperwork completed and placed in fax bin at back nurse station

## 2023-09-06 NOTE — Telephone Encounter (Signed)
Placed in your sign folder  

## 2023-09-07 LAB — CHROMOGRANIN A: Chromogranin A (ng/mL): 36.4 ng/mL (ref 0.0–101.8)

## 2023-09-11 ENCOUNTER — Ambulatory Visit (HOSPITAL_BASED_OUTPATIENT_CLINIC_OR_DEPARTMENT_OTHER): Admission: RE | Admit: 2023-09-11 | Payer: Medicare Other | Source: Ambulatory Visit

## 2023-09-14 ENCOUNTER — Ambulatory Visit (HOSPITAL_BASED_OUTPATIENT_CLINIC_OR_DEPARTMENT_OTHER)
Admission: RE | Admit: 2023-09-14 | Discharge: 2023-09-14 | Disposition: A | Discharge status: Home / Self Care | Payer: Medicare Other | Source: Ambulatory Visit | Attending: Hematology & Oncology | Admitting: Hematology & Oncology

## 2023-09-14 ENCOUNTER — Telehealth: Payer: Self-pay | Admitting: Family Medicine

## 2023-09-14 ENCOUNTER — Encounter: Payer: Self-pay | Admitting: *Deleted

## 2023-09-14 DIAGNOSIS — C772 Secondary and unspecified malignant neoplasm of intra-abdominal lymph nodes: Secondary | ICD-10-CM | POA: Diagnosis present

## 2023-09-14 DIAGNOSIS — C181 Malignant neoplasm of appendix: Secondary | ICD-10-CM | POA: Insufficient documentation

## 2023-09-14 NOTE — Telephone Encounter (Signed)
Placed in your sign folder for completion

## 2023-09-14 NOTE — Telephone Encounter (Signed)
Home Health Review  Is this a Review?  HH Agency: [facility]  Has charge sheet been attached? No  Where has form been placed: [employer]

## 2023-09-14 NOTE — Telephone Encounter (Signed)
Paperwork completed and placed in scan bin at back nurse station

## 2023-09-15 ENCOUNTER — Other Ambulatory Visit: Payer: Self-pay

## 2023-09-18 ENCOUNTER — Other Ambulatory Visit: Payer: Self-pay

## 2023-09-25 ENCOUNTER — Encounter: Payer: Self-pay | Admitting: Family Medicine

## 2023-09-25 ENCOUNTER — Other Ambulatory Visit (HOSPITAL_COMMUNITY): Payer: Self-pay

## 2023-09-25 ENCOUNTER — Other Ambulatory Visit: Payer: Medicare Other

## 2023-09-26 ENCOUNTER — Ambulatory Visit: Payer: Medicare Other | Admitting: Family

## 2023-09-26 ENCOUNTER — Encounter: Payer: Self-pay | Admitting: Hematology & Oncology

## 2023-09-26 ENCOUNTER — Other Ambulatory Visit: Payer: Medicare Other

## 2023-09-28 ENCOUNTER — Ambulatory Visit: Payer: Medicare Other | Admitting: Family Medicine

## 2023-09-29 ENCOUNTER — Other Ambulatory Visit (HOSPITAL_COMMUNITY): Payer: Self-pay

## 2023-10-01 ENCOUNTER — Other Ambulatory Visit: Payer: Self-pay | Admitting: Hematology & Oncology

## 2023-10-01 ENCOUNTER — Other Ambulatory Visit: Payer: Self-pay | Admitting: Behavioral Health

## 2023-10-01 DIAGNOSIS — C772 Secondary and unspecified malignant neoplasm of intra-abdominal lymph nodes: Secondary | ICD-10-CM

## 2023-10-01 DIAGNOSIS — H8112 Benign paroxysmal vertigo, left ear: Secondary | ICD-10-CM

## 2023-10-01 DIAGNOSIS — A0472 Enterocolitis due to Clostridium difficile, not specified as recurrent: Secondary | ICD-10-CM

## 2023-10-01 DIAGNOSIS — Z85038 Personal history of other malignant neoplasm of large intestine: Secondary | ICD-10-CM

## 2023-10-01 DIAGNOSIS — K649 Unspecified hemorrhoids: Secondary | ICD-10-CM

## 2023-10-01 DIAGNOSIS — F411 Generalized anxiety disorder: Secondary | ICD-10-CM

## 2023-10-01 DIAGNOSIS — F331 Major depressive disorder, recurrent, moderate: Secondary | ICD-10-CM

## 2023-10-02 ENCOUNTER — Inpatient Hospital Stay (HOSPITAL_BASED_OUTPATIENT_CLINIC_OR_DEPARTMENT_OTHER): Payer: Medicare Other | Admitting: Medical Oncology

## 2023-10-02 ENCOUNTER — Inpatient Hospital Stay: Payer: Medicare Other

## 2023-10-02 ENCOUNTER — Encounter: Payer: Self-pay | Admitting: Hematology & Oncology

## 2023-10-02 ENCOUNTER — Inpatient Hospital Stay: Payer: Medicare Other | Attending: Hematology & Oncology

## 2023-10-02 VITALS — BP 150/84 | HR 68 | Temp 98.4°F | Resp 18 | Ht 66.0 in | Wt 247.1 lb

## 2023-10-02 DIAGNOSIS — D510 Vitamin B12 deficiency anemia due to intrinsic factor deficiency: Secondary | ICD-10-CM

## 2023-10-02 DIAGNOSIS — C772 Secondary and unspecified malignant neoplasm of intra-abdominal lymph nodes: Secondary | ICD-10-CM

## 2023-10-02 DIAGNOSIS — L299 Pruritus, unspecified: Secondary | ICD-10-CM | POA: Diagnosis not present

## 2023-10-02 DIAGNOSIS — Z880 Allergy status to penicillin: Secondary | ICD-10-CM | POA: Insufficient documentation

## 2023-10-02 DIAGNOSIS — M549 Dorsalgia, unspecified: Secondary | ICD-10-CM | POA: Diagnosis not present

## 2023-10-02 DIAGNOSIS — I2699 Other pulmonary embolism without acute cor pulmonale: Secondary | ICD-10-CM | POA: Diagnosis not present

## 2023-10-02 DIAGNOSIS — C181 Malignant neoplasm of appendix: Secondary | ICD-10-CM | POA: Diagnosis not present

## 2023-10-02 DIAGNOSIS — R109 Unspecified abdominal pain: Secondary | ICD-10-CM | POA: Insufficient documentation

## 2023-10-02 DIAGNOSIS — R300 Dysuria: Secondary | ICD-10-CM | POA: Diagnosis not present

## 2023-10-02 DIAGNOSIS — Z881 Allergy status to other antibiotic agents status: Secondary | ICD-10-CM | POA: Diagnosis not present

## 2023-10-02 DIAGNOSIS — Z79899 Other long term (current) drug therapy: Secondary | ICD-10-CM | POA: Diagnosis not present

## 2023-10-02 DIAGNOSIS — R5383 Other fatigue: Secondary | ICD-10-CM | POA: Diagnosis not present

## 2023-10-02 DIAGNOSIS — R110 Nausea: Secondary | ICD-10-CM

## 2023-10-02 DIAGNOSIS — R7989 Other specified abnormal findings of blood chemistry: Secondary | ICD-10-CM | POA: Diagnosis not present

## 2023-10-02 DIAGNOSIS — Z888 Allergy status to other drugs, medicaments and biological substances status: Secondary | ICD-10-CM | POA: Insufficient documentation

## 2023-10-02 DIAGNOSIS — Z7901 Long term (current) use of anticoagulants: Secondary | ICD-10-CM | POA: Diagnosis not present

## 2023-10-02 DIAGNOSIS — K529 Noninfective gastroenteritis and colitis, unspecified: Secondary | ICD-10-CM | POA: Diagnosis not present

## 2023-10-02 DIAGNOSIS — F064 Anxiety disorder due to known physiological condition: Secondary | ICD-10-CM

## 2023-10-02 LAB — CMP (CANCER CENTER ONLY)
ALT: 42 U/L (ref 0–44)
AST: 57 U/L — ABNORMAL HIGH (ref 15–41)
Albumin: 3.8 g/dL (ref 3.5–5.0)
Alkaline Phosphatase: 162 U/L — ABNORMAL HIGH (ref 38–126)
Anion gap: 8 (ref 5–15)
BUN: 13 mg/dL (ref 6–20)
CO2: 30 mmol/L (ref 22–32)
Calcium: 9.1 mg/dL (ref 8.9–10.3)
Chloride: 102 mmol/L (ref 98–111)
Creatinine: 0.96 mg/dL (ref 0.44–1.00)
GFR, Estimated: >60 mL/min (ref >60)
Glucose, Bld: 206 mg/dL — ABNORMAL HIGH (ref 70–99)
Potassium: 3.9 mmol/L (ref 3.5–5.1)
Sodium: 140 mmol/L (ref 135–145)
Total Bilirubin: 0.6 mg/dL (ref <1.2)
Total Protein: 7.1 g/dL (ref 6.5–8.1)

## 2023-10-02 LAB — URINALYSIS, COMPLETE (UACMP) WITH MICROSCOPIC

## 2023-10-02 LAB — CBC WITH DIFFERENTIAL (CANCER CENTER ONLY)
Abs Immature Granulocytes: 0.01 10*3/uL (ref 0.00–0.07)
Basophils Absolute: 0.1 10*3/uL (ref 0.0–0.1)
Basophils Relative: 1 %
Eosinophils Absolute: 0.2 10*3/uL (ref 0.0–0.5)
Eosinophils Relative: 2 %
HCT: 38.9 % (ref 36.0–46.0)
Hemoglobin: 12.5 g/dL (ref 12.0–15.0)
Immature Granulocytes: 0 %
Lymphocytes Relative: 41 %
Lymphs Abs: 3 10*3/uL (ref 0.7–4.0)
MCH: 30.9 pg (ref 26.0–34.0)
MCHC: 32.1 g/dL (ref 30.0–36.0)
MCV: 96 fL (ref 80.0–100.0)
Monocytes Absolute: 0.6 10*3/uL (ref 0.1–1.0)
Monocytes Relative: 8 %
Neutro Abs: 3.5 10*3/uL (ref 1.7–7.7)
Neutrophils Relative %: 48 %
Platelet Count: 210 10*3/uL (ref 150–400)
RBC: 4.05 MIL/uL (ref 3.87–5.11)
RDW: 14.2 % (ref 11.5–15.5)
WBC Count: 7.3 10*3/uL (ref 4.0–10.5)
nRBC: 0 % (ref 0.0–0.2)

## 2023-10-02 LAB — TSH: TSH: 2.584 u[IU]/mL (ref 0.350–4.500)

## 2023-10-02 LAB — IRON AND IRON BINDING CAPACITY (CC-WL,HP ONLY)
Iron: 87 ug/dL (ref 28–170)
Saturation Ratios: 24 % (ref 10.4–31.8)
TIBC: 371 ug/dL (ref 250–450)
UIBC: 284 ug/dL (ref 148–442)

## 2023-10-02 LAB — FERRITIN: Ferritin: 72 ng/mL (ref 11–307)

## 2023-10-02 MED ORDER — LANREOTIDE ACETATE 120 MG/0.5ML ~~LOC~~ SOLN
120.0000 mg | Freq: Once | SUBCUTANEOUS | Status: AC
  Administered 2023-10-02: 120 mg via SUBCUTANEOUS
  Filled 2023-10-02: qty 120

## 2023-10-02 MED ORDER — HEPARIN SOD (PORK) LOCK FLUSH 100 UNIT/ML IV SOLN: 500.0000 [IU] | Freq: Once | INTRAVENOUS | Status: DC

## 2023-10-02 MED ORDER — SODIUM CHLORIDE 0.9% FLUSH: 10.0000 mL | INTRAVENOUS | Status: DC | PRN

## 2023-10-02 NOTE — Patient Instructions (Signed)
Lanreotide Injection What is this medication? LANREOTIDE (lan REE oh tide) treats high levels of growth hormone (acromegaly). It is used when other therapies have not worked well enough or cannot be tolerated. It works by reducing the amount of growth hormone your body makes. This reduces symptoms and the risk of health problems caused by too much growth hormone, such as diabetes and heart disease. It may also be used to treat neuroendocrine tumors, a cancer of the cells that release hormones and other substances in your body. It works by slowing down the release of these substances from the cells. This slows tumor growth. It also decreases the symptoms of carcinoid syndrome, such as flushing or diarrhea. This medicine may be used for other purposes; ask your health care provider or pharmacist if you have questions. COMMON BRAND NAME(S): Somatuline [place] What should I tell my care team before I take this medication? They need to know if you have any of these conditions: Diabetes Gallbladder disease Heart disease Kidney disease Liver disease Thyroid disease An unusual or allergic reaction to lanreotide, other medications, foods, dyes, or preservatives Pregnant or trying to get pregnant Breast-feeding How should I use this medication? This medication is injected under the skin. It is given by your care team in a hospital or clinic setting. Talk to your care team about the use of this medication in children. Special care may be needed. Overdosage: If you think you have taken too much of this medicine contact a poison control center or emergency room at once. NOTE: This medicine is only for you. Do not share this medicine with others. What if I miss a dose? Keep appointments for follow-up doses. It is important not to miss your dose. Call your care team if you are unable to keep an appointment. What may interact with this medication? Bromocriptine Cyclosporine Certain medications for blood  pressure, heart disease, irregular heartbeat Certain medications for diabetes Quinidine Terfenadine This list may not describe all possible interactions. Give your health care provider a list of all the medicines, herbs, non-prescription drugs, or dietary supplements you use. Also tell them if you smoke, drink alcohol, or use illegal drugs. Some items may interact with your medicine. What should I watch for while using this medication? Visit your care team for regular checks on your progress. Tell your care team if your symptoms do not start to get better or if they get worse. Your condition will be monitored carefully while you are receiving this medication. You may need blood work while you are taking this medication. This medication may increase blood sugar. The risk may be higher in patients who already have diabetes. Ask your care team what you can do to lower your risk of diabetes while taking this medication. Talk to your care team if you wish to become pregnant or think you may be pregnant. This medication can cause serious birth defects. Do not breast-feed while taking this medication and for 6 months after stopping therapy. This medication may cause infertility. Talk to your care team if you are concerned about your fertility. What side effects may I notice from receiving this medication? Side effects that you should report to your care team as soon as possible: Allergic reactions--skin rash, itching, hives, swelling of the face, lips, tongue, or throat Gallbladder problems--severe stomach pain, nausea, vomiting, fever High blood sugar (hyperglycemia)--increased thirst or amount of urine, unusual weakness or fatigue, blurry vision Increase in blood pressure Low blood sugar (hypoglycemia)--tremors or shaking, anxiety, sweating, cold   or clammy skin, confusion, dizziness, rapid heartbeat Low thyroid levels (hypothyroidism)--unusual weakness or fatigue, increased sensitivity to cold,  constipation, hair loss, dry skin, weight gain, feelings of depression Slow heartbeat--dizziness, feeling faint or lightheaded, confusion, trouble breathing, unusual weakness or fatigue Side effects that usually do not require medical attention (report to your care team if they continue or are bothersome): Diarrhea Dizziness Headache Muscle spasms Nausea Pain, redness, irritation, or bruising at the injection site Stomach pain This list may not describe all possible side effects. Call your doctor for medical advice about side effects. You may report side effects to FDA at 1-800-FDA-1088. Where should I keep my medication? This medication is given in a hospital or clinic. It will not be stored at home. NOTE: This sheet is a summary. It may not cover all possible information. If you have questions about this medicine, talk to your doctor, pharmacist, or health care provider.  2024 Elsevier/Gold Standard (2022-03-02 00:00:00)  

## 2023-10-02 NOTE — Progress Notes (Signed)
Hematology and Oncology Follow Up Visit  Wendy Barber [id number] Mar 21, 1961 62 y.o. 10/02/2023   Principle Diagnosis:  History of metastatic appendiceal cancer  -- recurrent  Superior mesenteric vein thrombus Thromboembolism of the RIGHT leg Pernicious anemia Acute pulmonary embolism-segmental right pulmonary artery Carcinoid/neuroendocrine carcinoma  Current Therapy:   HIPEC - Surgery done in [place] in 11/2020 Arixtra 10 mg subcu daily --start on 2/16 2024 Vitamin B12 5000 mcg PO daily  Somatuline 120 mg IM monthly-start on 07/12/2023     Interim History:  Wendy Barber is in for follow-up. She is here with her husband.   Today she states that she is doing well other than a suspected UTI which she has had for the past 7 days. Symptoms include dysuria, itching. She denies fevers, vomiting, severe abdominal pain. She has tried AZO for symptom. No recent UTIs or hospitalizations within 3 months.   Has had a history of body aches, elevated LFTs and chronic diarrhea.   Her Chromogranin A level has come down quite nicely.  When we last checked, the level was down to 36.4 which continues to trend down nicely   She is having no problems with increased cough or shortness of breath.  No known bleeding but did have some clay colored stools 2 weeks ago. Lasted 2 days. Patient continues on the Arixtra.   Iron studied pending- ferritin 73, iron saturation 24% at last visit  Overall, I would have said that her performance status is probably ECOG 2.  Wt Readings from Last 3 Encounters:  10/02/23 247 lb 1.9 oz (112.1 kg)  09/05/23 250 lb (113.4 kg)  08/09/23 250 lb (113.4 kg)    Medications:  Current Outpatient Medications:    amLODipine (NORVASC) 5 MG tablet, TAKE 1 TABLET(5 MG) BY MOUTH DAILY, Disp: 90 tablet, Rfl: 1   busPIRone (BUSPAR) 15 MG tablet, Take 1 tablet (15 mg total) by mouth 3 (three) times daily., Disp: 270 tablet, Rfl: 0   cabergoline (DOSTINEX) 0.5 MG tablet, Take  0.5 tablets (0.25 mg total) by mouth 2 (two) times a week., Disp: 12 tablet, Rfl: 2   citalopram (CELEXA) 40 MG tablet, Take 1 tablet (40 mg total) by mouth daily., Disp: 90 tablet, Rfl: 1   Dextromethorphan-buPROPion ER (AUVELITY) 45-105 MG TBCR, Take one tablet by mouth for 3 days, then take two tablet daily 7-8 hours between doses, Disp: 60 tablet, Rfl: 1   dicyclomine (BENTYL) 20 MG tablet, TAKE 1 TABLET(20 MG) BY MOUTH THREE TIMES DAILY BEFORE MEALS, Disp: 90 tablet, Rfl: 4   diphenoxylate-atropine (LOMOTIL) 2.5-0.025 MG tablet, TAKE 2 TABLETS BY MOUTH FOUR TIMES DAILY AS NEEDED FOR LOOSE STOOLS OR DIARRHEA, Disp: 240 tablet, Rfl: 0   eszopiclone (LUNESTA) 2 MG TABS tablet, TAKE 1 TABLET(2 MG) BY MOUTH AT BEDTIME AS NEEDED FOR SLEEP, Disp: 30 tablet, Rfl: 1   fondaparinux (ARIXTRA) 10 MG/0.8ML SOLN injection, Inject 0.8 mLs (10 mg total) into the skin daily., Disp: 72 mL, Rfl: 3   hydrocortisone (ANUSOL-HC) 2.5 % rectal cream, PLACE RECTALLY 4 TIMES DAILY AS NEEDED FOR HEMORRHOIDS OR ANAL ITCHING, Disp: 30 g, Rfl: 1   HYDROmorphone (DILAUDID) 4 MG tablet, Take 1 tablet (4 mg total) by mouth every 6 (six) hours as needed for severe pain (pain score 7-10)., Disp: 60 tablet, Rfl: 0   loperamide (IMODIUM) 2 MG capsule, Take 2 capsules (4 mg total) by mouth 4 (four) times daily as needed for diarrhea or loose stools., Disp: , Rfl:    meclizine (  ANTIVERT) 25 MG tablet, TAKE 1 TABLET(25 MG) BY MOUTH THREE TIMES DAILY AS NEEDED FOR DIZZINESS, Disp: 60 tablet, Rfl: 0   nystatin-triamcinolone ointment (MYCOLOG), Apply 1 Application topically 2 (two) times daily. For up to 7 days and then STOP.  Repeat as needed for flares after at least a 2 week break, Disp: 30 g, Rfl: 0   ondansetron (ZOFRAN) 8 MG tablet, Take 1 tablet (8 mg total) by mouth every 8 (eight) hours as needed for nausea or vomiting., Disp: 30 tablet, Rfl: 2   Opium 10 MG/ML (1%) TINC, Take 0.6 mLs (6 mg total) by mouth every 3 (three) hours as  needed for diarrhea or loose stools., Disp: 118 mL, Rfl: 0   orphenadrine (NORFLEX) 100 MG tablet, TAKE 1 TABLET(100 MG) BY MOUTH AT BEDTIME AS NEEDED FOR MUSCLE SPASMS, Disp: 30 tablet, Rfl: 2   pantoprazole (PROTONIX) 40 MG tablet, Take 1 tablet (40 mg total) by mouth daily before breakfast., Disp: 90 tablet, Rfl: 1   potassium chloride (KLOR-CON M) 10 MEQ tablet, Take 2 tablets (20 mEq total) by mouth daily., Disp: 60 tablet, Rfl: 1   pramoxine-hydrocortisone (PROCTOCREAM-HC) 1-1 % rectal cream, Place 1 Application rectally 2 (two) times daily. (Patient taking differently: Place 1 Application rectally 2 (two) times daily as needed for hemorrhoids or anal itching (if hydrocortisone 2.5% cream is not available).), Disp: 30 g, Rfl: 3   promethazine (PHENERGAN) 12.5 MG tablet, Take 1 tablet (12.5 mg total) by mouth every 6 (six) hours as needed for nausea or vomiting., Disp: 90 tablet, Rfl: 3   propranolol (INDERAL) 20 MG tablet, TAKE 1 TABLET(20 MG) BY MOUTH TWICE DAILY, Disp: 180 tablet, Rfl: 1   buPROPion (WELLBUTRIN XL) 300 MG 24 hr tablet, TAKE 1 TABLET(300 MG) BY MOUTH DAILY (Patient not taking: Reported on 10/02/2023), Disp: 30 tablet, Rfl: 0   cyanocobalamin 1000 MCG tablet, Take 1 tablet (1,000 mcg total) by mouth daily. (Patient not taking: Reported on 08/09/2023), Disp: , Rfl:    diazepam (VALIUM) 5 MG tablet, Take 1 tab 30 minutes prior to enclosed imaging study. (Patient not taking: Reported on 06/23/2023), Disp: 4 tablet, Rfl: 1   ferrous sulfate 325 (65 FE) MG tablet, Take 1 tablet (325 mg total) by mouth daily., Disp: 30 tablet, Rfl: 3  Allergies:  Allergies  Allergen Reactions   Ativan [Lorazepam] Swelling and Other (See Comments)    Face & Throat Swelling  Note: tolerates midazolam fine   Corticosteroids Other (See Comments)    "Psychotic behavior"    Penicillins Shortness Of Breath and Other (See Comments)    Irregular and rapid Heart Rate, too    Alprazolam Hives and Other  (See Comments)    Hard to arouse, unresponsiveness also   Erythromycin Nausea And Vomiting        Gabapentin Other (See Comments)    Made the patient feel depressed   Prednisolone Anxiety   Prednisone Anxiety and Other (See Comments)    "Anxiety & Nervous Breakdown"   Savella  [Milnacipran] Other (See Comments)    Reaction not noted    Past Medical History, Surgical history, Social history, and Family History were reviewed and updated.  Review of Systems: Review of Systems  Constitutional:  Positive for fatigue and unexpected weight change.  HENT:  Negative.    Eyes: Negative.   Respiratory: Negative.    Cardiovascular: Negative.   Gastrointestinal:  Positive for abdominal pain and diarrhea.  Endocrine: Negative.   Genitourinary:  Positive for  dysuria.   Musculoskeletal:  Positive for back pain.  Skin: Negative.   Neurological: Negative.   Hematological: Negative.   Psychiatric/Behavioral:  Negative for depression.     Physical Exam: Vitals:   10/02/23 1027  BP: (!) 150/84  Pulse: 68  Resp: 18  Temp: 98.4 F (36.9 C)  SpO2: 94%   Wt Readings from Last 3 Encounters:  10/02/23 247 lb 1.9 oz (112.1 kg)  09/05/23 250 lb (113.4 kg)  08/09/23 250 lb (113.4 kg)    Physical Exam Vitals reviewed.  HENT:     Head: Normocephalic and atraumatic.  Eyes:     Pupils: Pupils are equal, round, and reactive to light.  Cardiovascular:     Rate and Rhythm: Normal rate and regular rhythm.     Heart sounds: Normal heart sounds.  Pulmonary:     Effort: Pulmonary effort is normal.     Breath sounds: Normal breath sounds.  Abdominal:     General: Bowel sounds are normal.     Palpations: Abdomen is soft.  Musculoskeletal:        General: No tenderness or deformity. Normal range of motion.     Cervical back: Normal range of motion.  Lymphadenopathy:     Cervical: No cervical adenopathy.  Skin:    General: Skin is warm and dry.     Findings: No erythema or rash.   Neurological:     Mental Status: She is alert and oriented to person, place, and time.  Psychiatric:        Behavior: Behavior normal.        Thought Content: Thought content normal.        Judgment: Judgment normal.    Lab Results  Component Value Date   WBC 7.3 10/02/2023   HGB 12.5 10/02/2023   HCT 38.9 10/02/2023   MCV 96.0 10/02/2023   PLT 210 10/02/2023     Chemistry      Component Value Date/Time   NA 140 10/02/2023 1005   NA 137 04/24/2023 1437   K 3.9 10/02/2023 1005   CL 102 10/02/2023 1005   CO2 30 10/02/2023 1005   BUN 13 10/02/2023 1005   BUN 18 04/24/2023 1437   CREATININE 0.96 10/02/2023 1005      Component Value Date/Time   CALCIUM 9.1 10/02/2023 1005   ALKPHOS 162 (H) 10/02/2023 1005   AST 57 (H) 10/02/2023 1005   ALT 42 10/02/2023 1005   BILITOT 0.6 10/02/2023 1005     Encounter Diagnosis  Name Primary?   Dysuria Yes    Impression and Plan: Ms. Mcmorris is a very charming 62 year-old white female. She has recurrent appendiceal cancer.  She underwent a HIPEC procedure in [place] a couple years ago. Unfortunately she had had chronic diarrhea since including C. Diff which has been successfully treatment.    She is on Somatuline which she is tolerating well. BP is a bit elevated today which is a potential side effect of her medication. We will need to watch this and she will need close follow up with her PCP/cardiologist. Also her glucose is 206 today- may be falsely elevated due to suspected UTI. Again she will need to work with her PCP regarding this potential side effect of her Somatuline. She does not have an appointment to see her PCP yet.   Today her CMP shows improved LFTs. CBC reassuring. She is ok for treatment today.   In terms of her UTI symptoms I actually suspect that she  has a vaginal infection such as vaginal candidiasis or bacterial vaginosis. Unfortunately I do not have testing available for this in our office which we discussed. I  have advised she see her PCP or  urgent care. She has a history of C. Diff with ABX use so given her normal WBC, normal creatinine function and symptoms I have suggested not treatment with an ABX until her urine culture returns and shows a definitive infection. She will trial OTC monostat in the meantime.   RTC 1 month MD, port labs (CBC w/, CMP, chromagranin A, ferritin, iron), treatment    Rushie Chestnut, PA-C 12/9/202411:14 AM

## 2023-10-02 NOTE — Progress Notes (Signed)
No B12 injection today per provider.  Anola Gurney Capron, [place], BCPS, BCOP 10/02/2023 11:56 AM

## 2023-10-02 NOTE — Patient Instructions (Signed)

## 2023-10-03 ENCOUNTER — Ambulatory Visit: Payer: Medicare Other

## 2023-10-03 ENCOUNTER — Inpatient Hospital Stay: Payer: Medicare Other

## 2023-10-03 ENCOUNTER — Ambulatory Visit: Payer: Medicare Other | Admitting: Hematology & Oncology

## 2023-10-03 ENCOUNTER — Other Ambulatory Visit: Payer: Medicare Other

## 2023-10-04 LAB — URINE CULTURE: Culture: >=100000 — AB

## 2023-10-04 LAB — CHROMOGRANIN A: Chromogranin A (ng/mL): 49.4 ng/mL (ref 0.0–101.8)

## 2023-10-05 ENCOUNTER — Other Ambulatory Visit: Payer: Self-pay | Admitting: Medical Oncology

## 2023-10-05 MED ORDER — NITROFURANTOIN MONOHYD MACRO 100 MG PO CAPS: 100.0000 mg | ORAL_CAPSULE | Freq: Two times a day (BID) | ORAL | 0 refills | Status: AC

## 2023-10-06 ENCOUNTER — Other Ambulatory Visit: Payer: Self-pay | Admitting: Family Medicine

## 2023-10-06 DIAGNOSIS — F411 Generalized anxiety disorder: Secondary | ICD-10-CM

## 2023-10-09 NOTE — Telephone Encounter (Signed)
Care Team updated and results abstracted.

## 2023-10-10 ENCOUNTER — Encounter (HOSPITAL_BASED_OUTPATIENT_CLINIC_OR_DEPARTMENT_OTHER): Payer: Self-pay | Admitting: Urology

## 2023-10-10 ENCOUNTER — Emergency Department (HOSPITAL_BASED_OUTPATIENT_CLINIC_OR_DEPARTMENT_OTHER): Payer: Medicare Other

## 2023-10-10 ENCOUNTER — Emergency Department (HOSPITAL_BASED_OUTPATIENT_CLINIC_OR_DEPARTMENT_OTHER)
Admission: EM | Admit: 2023-10-10 | Discharge: 2023-10-10 | Disposition: A | Discharge status: Home / Self Care | Payer: Medicare Other | Attending: Emergency Medicine | Admitting: Emergency Medicine

## 2023-10-10 ENCOUNTER — Other Ambulatory Visit: Payer: Self-pay

## 2023-10-10 ENCOUNTER — Ambulatory Visit: Payer: Medicare Other | Admitting: Family Medicine

## 2023-10-10 ENCOUNTER — Encounter: Payer: Self-pay | Admitting: Family Medicine

## 2023-10-10 VITALS — BP 144/91 | HR 78 | Temp 98.6°F | Resp 18 | Ht 66.0 in | Wt 243.2 lb

## 2023-10-10 DIAGNOSIS — R0602 Shortness of breath: Secondary | ICD-10-CM | POA: Diagnosis not present

## 2023-10-10 DIAGNOSIS — R051 Acute cough: Secondary | ICD-10-CM | POA: Diagnosis not present

## 2023-10-10 DIAGNOSIS — Z85038 Personal history of other malignant neoplasm of large intestine: Secondary | ICD-10-CM | POA: Diagnosis not present

## 2023-10-10 DIAGNOSIS — J069 Acute upper respiratory infection, unspecified: Secondary | ICD-10-CM | POA: Diagnosis not present

## 2023-10-10 LAB — BASIC METABOLIC PANEL
Anion gap: 10 (ref 5–15)
BUN: 13 mg/dL (ref 6–20)
CO2: 26 mmol/L (ref 22–32)
Calcium: 8.9 mg/dL (ref 8.9–10.3)
Chloride: 99 mmol/L (ref 98–111)
Creatinine, Ser: 0.99 mg/dL (ref 0.44–1.00)
GFR, Estimated: >60 mL/min (ref >60)
Glucose, Bld: 273 mg/dL — ABNORMAL HIGH (ref 70–99)
Potassium: 3.7 mmol/L (ref 3.5–5.1)
Sodium: 135 mmol/L (ref 135–145)

## 2023-10-10 LAB — CBC WITH DIFFERENTIAL/PLATELET
Abs Immature Granulocytes: 0.03 10*3/uL (ref 0.00–0.07)
Basophils Absolute: 0.1 10*3/uL (ref 0.0–0.1)
Basophils Relative: 1 %
Eosinophils Absolute: 0.2 10*3/uL (ref 0.0–0.5)
Eosinophils Relative: 2 %
HCT: 41.2 % (ref 36.0–46.0)
Hemoglobin: 13.1 g/dL (ref 12.0–15.0)
Immature Granulocytes: 0 %
Lymphocytes Relative: 30 %
Lymphs Abs: 3.2 10*3/uL (ref 0.7–4.0)
MCH: 30.6 pg (ref 26.0–34.0)
MCHC: 31.8 g/dL (ref 30.0–36.0)
MCV: 96.3 fL (ref 80.0–100.0)
Monocytes Absolute: 0.8 10*3/uL (ref 0.1–1.0)
Monocytes Relative: 8 %
Neutro Abs: 6.4 10*3/uL (ref 1.7–7.7)
Neutrophils Relative %: 59 %
Platelets: 262 10*3/uL (ref 150–400)
RBC: 4.28 MIL/uL (ref 3.87–5.11)
RDW: 14.6 % (ref 11.5–15.5)
WBC: 10.7 10*3/uL — ABNORMAL HIGH (ref 4.0–10.5)
nRBC: 0 % (ref 0.0–0.2)

## 2023-10-10 LAB — POCT INFLUENZA A/B
Influenza A, POC: NEGATIVE
Influenza B, POC: NEGATIVE

## 2023-10-10 LAB — PROTIME-INR
INR: 1 (ref 0.8–1.2)
Prothrombin Time: 13.6 s (ref 11.4–15.2)

## 2023-10-10 LAB — POC COVID19 BINAXNOW: SARS Coronavirus 2 Ag: NEGATIVE

## 2023-10-10 MED ORDER — BENZONATATE 100 MG PO CAPS: 100.0000 mg | ORAL_CAPSULE | Freq: Three times a day (TID) | ORAL | 0 refills | Status: DC

## 2023-10-10 MED ORDER — IOHEXOL 350 MG/ML SOLN
100.0000 mL | Freq: Once | INTRAVENOUS | Status: AC | PRN
  Administered 2023-10-10: 100 mL via INTRAVENOUS

## 2023-10-10 NOTE — ED Triage Notes (Signed)
Dry hacking cough, SOB that started 2.5 days ago  States called pcp, COVID and FLU negative   H/o PE and DVTs  On Bloood thinners and has filter

## 2023-10-10 NOTE — ED Notes (Signed)
Pt. Reports she has had a dry cough and is concerned she may have a clot due to history of having DVTs and Clot history.  Pt. In no distress and reports she just needs to know why the cough and make sure her Blood thinners are working.

## 2023-10-10 NOTE — Progress Notes (Signed)
Subjective:     Patient ID: Wendy Barber, female    DOB: 1961-04-21, 61 y.o.   MRN: [id number]  Chief Complaint  Patient presents with   Cough    Dry hacky cough since Sunday, hx of PE    HPI-here w/husb Pt w/mult med problems inc DM,DVT/PE-takes arixtra daily-has failed mult others, appendix ca, carcinoid.  Chronic PE and DVT's.  Has greenfield filter.  Cough-dry for 2 days. Last time had this, sent for CT and dx PE.  Daughter nurse and said lungs sound clear.  Per pt, temp 98 is a fever. Some worsening DOE. No chills.   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   Chronic fatigue syndrome    Diabetes mellitus without complication (HCC)    DVT of deep femoral vein, left (HCC) 03/19/2020   Fibromyalgia    Goals of care, counseling/discussion 03/19/2020   Hypertension    Iron deficiency anemia due to chronic blood loss 05/14/2021   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   Pernicious anemia 05/14/2021   Presence of IVC filter 03/19/2020   Pulmonary embolism, bilateral (HCC) 03/19/2020   Short bowel syndrome, unspecified     Past Surgical History:  Procedure Laterality Date   ABDOMINAL HYSTERECTOMY     ABDOMINAL SURGERY     ACHILLES TENDON REPAIR     APPENDECTOMY     ARTHROPLASTY     CARPAL TUNNEL RELEASE     CHOLECYSTECTOMY     IR CATHETER TUBE CHANGE  02/02/2021   IR CATHETER TUBE CHANGE  02/25/2021   IR IMAGING GUIDED PORT INSERTION  06/20/2022   IR RADIOLOGIST EVAL & MGMT  02/24/2021   IR RADIOLOGIST EVAL & MGMT   03/10/2021   IR RADIOLOGIST EVAL & MGMT  06/22/2022   IR THROMBECT VENO MECH MOD SED  06/20/2022   IR US GUIDE BX ASP/DRAIN  11/25/2019   IR US GUIDE VASC ACCESS LEFT  06/20/2022   IR US GUIDE VASC ACCESS RIGHT  06/20/2022   IR US GUIDE VASC ACCESS RIGHT  06/20/2022   IR VENO/EXT/BI  06/20/2022   IR VENOCAVAGRAM IVC  06/20/2022   JOINT REPLACEMENT     KNEE ARTHROSCOPY     ORIF ANKLE FRACTURE Right 08/27/2019   Procedure: OPEN REDUCTION INTERNAL FIXATION RIGHT ANKLE FRACTURE;  Surgeon: Sheral Apley, MD;  Location: [facility];  Service: Orthopedics;  Laterality: Right;   perineorrophy     TONGUE BIOPSY       Current Outpatient Medications:    amLODipine (NORVASC) 5 MG tablet, TAKE 1 TABLET(5 MG) BY MOUTH DAILY, Disp: 90 tablet, Rfl: 1   busPIRone (BUSPAR) 15 MG tablet, Take 1 tablet (15 mg total) by mouth 3 (three) times daily., Disp: 270 tablet, Rfl: 0   cabergoline (DOSTINEX) 0.5 MG tablet, Take 0.5 tablets (0.25 mg total) by mouth 2 (two) times a week., Disp: 12 tablet, Rfl: 2   citalopram (CELEXA) 40 MG tablet, Take 1 tablet (40  mg total) by mouth daily., Disp: 90 tablet, Rfl: 1   Dextromethorphan-buPROPion ER (AUVELITY) 45-105 MG TBCR, Take one tablet by mouth for 3 days, then take two tablet daily 7-8 hours between doses, Disp: 60 tablet, Rfl: 1   diazepam (VALIUM) 5 MG tablet, Take 1 tab 30 minutes prior to enclosed imaging study., Disp: 4 tablet, Rfl: 1   dicyclomine (BENTYL) 20 MG tablet, TAKE 1 TABLET(20 MG) BY MOUTH THREE TIMES DAILY BEFORE MEALS, Disp: 90 tablet, Rfl: 4   diphenoxylate-atropine (LOMOTIL) 2.5-0.025 MG tablet, TAKE 2 TABLETS BY MOUTH FOUR TIMES DAILY AS NEEDED FOR LOOSE STOOLS OR DIARRHEA, Disp: 240 tablet, Rfl: 0   eszopiclone (LUNESTA) 2 MG TABS tablet, TAKE 1 TABLET(2 MG) BY MOUTH AT BEDTIME AS NEEDED FOR SLEEP, Disp: 30 tablet, Rfl: 1   fondaparinux (ARIXTRA) 10 MG/0.8ML SOLN injection, Inject 0.8 mLs (10 mg total) into the skin daily., Disp: 72 mL, Rfl: 3    hydrocortisone (ANUSOL-HC) 2.5 % rectal cream, PLACE RECTALLY 4 TIMES DAILY AS NEEDED FOR HEMORRHOIDS OR ANAL ITCHING, Disp: 30 g, Rfl: 1   HYDROmorphone (DILAUDID) 4 MG tablet, Take 1 tablet (4 mg total) by mouth every 6 (six) hours as needed for severe pain (pain score 7-10)., Disp: 60 tablet, Rfl: 0   loperamide (IMODIUM) 2 MG capsule, Take 2 capsules (4 mg total) by mouth 4 (four) times daily as needed for diarrhea or loose stools., Disp: , Rfl:    meclizine (ANTIVERT) 25 MG tablet, TAKE 1 TABLET(25 MG) BY MOUTH THREE TIMES DAILY AS NEEDED FOR DIZZINESS, Disp: 60 tablet, Rfl: 0   nitrofurantoin, macrocrystal-monohydrate, (MACROBID) 100 MG capsule, Take 1 capsule (100 mg total) by mouth 2 (two) times daily for 5 days., Disp: 10 capsule, Rfl: 0   nystatin-triamcinolone ointment (MYCOLOG), Apply 1 Application topically 2 (two) times daily. For up to 7 days and then STOP.  Repeat as needed for flares after at least a 2 week break, Disp: 30 g, Rfl: 0   ondansetron (ZOFRAN) 8 MG tablet, Take 1 tablet (8 mg total) by mouth every 8 (eight) hours as needed for nausea or vomiting., Disp: 30 tablet, Rfl: 2   Opium 10 MG/ML (1%) TINC, Take 0.6 mLs (6 mg total) by mouth every 3 (three) hours as needed for diarrhea or loose stools., Disp: 118 mL, Rfl: 0   orphenadrine (NORFLEX) 100 MG tablet, TAKE 1 TABLET(100 MG) BY MOUTH AT BEDTIME AS NEEDED FOR MUSCLE SPASMS, Disp: 30 tablet, Rfl: 2   pantoprazole (PROTONIX) 40 MG tablet, Take 1 tablet (40 mg total) by mouth daily before breakfast., Disp: 90 tablet, Rfl: 1   potassium chloride (KLOR-CON M) 10 MEQ tablet, Take 2 tablets (20 mEq total) by mouth daily., Disp: 60 tablet, Rfl: 1   pramoxine-hydrocortisone (PROCTOCREAM-HC) 1-1 % rectal cream, Place 1 Application rectally 2 (two) times daily. (Patient taking differently: Place 1 Application rectally 2 (two) times daily as needed for hemorrhoids or anal itching (if hydrocortisone 2.5% cream is not available).), Disp: 30  g, Rfl: 3   promethazine (PHENERGAN) 12.5 MG tablet, Take 1 tablet (12.5 mg total) by mouth every 6 (six) hours as needed for nausea or vomiting., Disp: 90 tablet, Rfl: 3   propranolol (INDERAL) 20 MG tablet, TAKE 1 TABLET(20 MG) BY MOUTH TWICE DAILY, Disp: 180 tablet, Rfl: 1   buPROPion (WELLBUTRIN XL) 300 MG 24 hr tablet, TAKE 1 TABLET(300 MG) BY MOUTH DAILY (Patient not taking: Reported on 10/10/2023), Disp: 30 tablet, Rfl: 0   cyanocobalamin  1000 MCG tablet, Take 1 tablet (1,000 mcg total) by mouth daily. (Patient not taking: Reported on 10/10/2023), Disp: , Rfl:    ferrous sulfate 325 (65 FE) MG tablet, Take 1 tablet (325 mg total) by mouth daily., Disp: 30 tablet, Rfl: 3  Allergies  Allergen Reactions   Ativan [Lorazepam] Swelling and Other (See Comments)    Face & Throat Swelling  Note: tolerates midazolam fine   Corticosteroids Other (See Comments)    "Psychotic behavior"    Penicillins Shortness Of Breath and Other (See Comments)    Irregular and rapid Heart Rate, too    Alprazolam Hives and Other (See Comments)    Hard to arouse, unresponsiveness also   Erythromycin Nausea And Vomiting        Gabapentin Other (See Comments)    Made the patient feel depressed   Prednisolone Anxiety   Prednisone Anxiety and Other (See Comments)    "Anxiety & Nervous Breakdown"   Savella  [Milnacipran] Other (See Comments)    Reaction not noted   ROS neg/noncontributory except as noted HPI/below      Objective:     BP (!) 144/91   Pulse 78   Temp 98.6 F (37 C) (Temporal)   Resp 18   Ht 5\' 6"  (1.676 m)   Wt 243 lb 4 oz (110.3 kg)   SpO2 93%   BMI 39.26 kg/m  Wt Readings from Last 3 Encounters:  10/10/23 243 lb 4 oz (110.3 kg)  10/02/23 247 lb 1.9 oz (112.1 kg)  09/05/23 250 lb (113.4 kg)    Physical Exam   Gen: WDWN NAD HEENT: NCAT, conjunctiva not injected, sclera nonicteric TM WNL B, OP moist, no exudates  NECK:  supple, no thyromegaly, no nodes, no carotid  bruits CARDIAC: RRR, S1S2+, no murmur.  LUNGS: CTAB. No wheezes EXT:  no edema MSK: no gross abnormalities.  NEURO: A&O x3.  CN II-XII intact.  PSYCH: normal mood. Good eye contact  Results for orders placed or performed in visit on 10/10/23  POC COVID-19   Collection Time: 10/10/23  4:51 PM  Result Value Ref Range   SARS Coronavirus 2 Ag Negative Negative  POCT Influenza A/B   Collection Time: 10/10/23  4:51 PM  Result Value Ref Range   Influenza A, POC Negative Negative   Influenza B, POC Negative Negative        Assessment & Plan:  Acute cough -     POC COVID-19 BinaxNow -     POCT Influenza A/B -     CT Angio Chest Pulmonary Embolism (PE) W or WO Contrast; Future  SOB (shortness of breath) on exertion -     CT Angio Chest Pulmonary Embolism (PE) W or WO Contrast; Future   Cough/sob.  H/o mult Pe's, etc.  ?URI/pneumonia/PE, chf, other.  Will check CT angio.  Pt already on blood thinner-failed mult others.  Very high risk for complications from pneumonia, etc.  So need to do stat CT chest.   No follow-ups on file.  Angelena Sole, MD

## 2023-10-10 NOTE — ED Provider Notes (Signed)
[facility] AT [facility] Provider Note   CSN: [id number] Arrival date & time: 10/10/23  1835     History  Chief Complaint  Patient presents with   Cough    Wendy Barber is a 62 y.o. female past medical history significant for chronic PE, chronic bilateral DVT, metastatic appendix cancer, and carcinoid tumor presents today for shortness of breath times approximately 3 days.  Patient also endorses nonproductive cough.  Patient denies nausea, vomiting, sore throat, congestion, fever, weakness, or chest pain.  Patient is currently on blood thinners and a filter and is compliant with her medications.   Cough      Home Medications Prior to Admission medications   Medication Sig Start Date End Date Taking? Authorizing Provider  benzonatate (TESSALON) 100 MG capsule Take 1 capsule (100 mg total) by mouth every 8 (eight) hours. 10/10/23  Yes Dolphus Jenny, PA-C  amLODipine (NORVASC) 5 MG tablet TAKE 1 TABLET(5 MG) BY MOUTH DAILY 04/24/23   Shade Flood, MD  buPROPion (WELLBUTRIN XL) 300 MG 24 hr tablet TAKE 1 TABLET(300 MG) BY MOUTH DAILY Patient not taking: Reported on 10/10/2023 10/01/23   [name], NP  busPIRone (BUSPAR) 15 MG tablet Take 1 tablet (15 mg total) by mouth 3 (three) times daily. 07/11/23   [name], NP  cabergoline (DOSTINEX) 0.5 MG tablet Take 0.5 tablets (0.25 mg total) by mouth 2 (two) times a week. 06/26/23   [name], [name], MD  citalopram (CELEXA) 40 MG tablet Take 1 tablet (40 mg total) by mouth daily. 07/11/23   [name], NP  cyanocobalamin 1000 MCG tablet Take 1 tablet (1,000 mcg total) by mouth daily. Patient not taking: Reported on 10/10/2023 07/26/22   Lonia Blood, MD  Dextromethorphan-buPROPion ER (AUVELITY) 45-105 MG TBCR Take one tablet by mouth for 3 days, then take two tablet daily 7-8 hours between doses 03/07/23   [name], NP  diazepam (VALIUM) 5 MG tablet Take 1 tab 30 minutes  prior to enclosed imaging study. 06/01/23   Shade Flood, MD  dicyclomine (BENTYL) 20 MG tablet TAKE 1 TABLET(20 MG) BY MOUTH THREE TIMES DAILY BEFORE MEALS 07/26/23   [name], MD  diphenoxylate-atropine (LOMOTIL) 2.5-0.025 MG tablet TAKE 2 TABLETS BY MOUTH FOUR TIMES DAILY AS NEEDED FOR LOOSE STOOLS OR DIARRHEA 10/02/23   [name], MD  eszopiclone (LUNESTA) 2 MG TABS tablet TAKE 1 TABLET(2 MG) BY MOUTH AT BEDTIME AS NEEDED FOR SLEEP 08/30/23   Mozingo, Thereasa Solo, NP  ferrous sulfate 325 (65 FE) MG tablet Take 1 tablet (325 mg total) by mouth daily. 06/03/22 06/03/23  [name], Pratik D, DO  fondaparinux (ARIXTRA) 10 MG/0.8ML SOLN injection Inject 0.8 mLs (10 mg total) into the skin daily. 03/15/23   [name], MD  hydrocortisone (ANUSOL-HC) 2.5 % rectal cream PLACE RECTALLY 4 TIMES DAILY AS NEEDED FOR HEMORRHOIDS OR ANAL ITCHING 10/02/23   [name], MD  HYDROmorphone (DILAUDID) 4 MG tablet Take 1 tablet (4 mg total) by mouth every 6 (six) hours as needed for severe pain (pain score 7-10). 09/05/23   [name], MD  loperamide (IMODIUM) 2 MG capsule Take 2 capsules (4 mg total) by mouth 4 (four) times daily as needed for diarrhea or loose stools. 05/08/22   Glade Lloyd, MD  meclizine (ANTIVERT) 25 MG tablet TAKE 1 TABLET(25 MG) BY MOUTH THREE TIMES DAILY AS NEEDED FOR DIZZINESS 10/02/23   Ennever,  Rose Phi, MD  nitrofurantoin, macrocrystal-monohydrate, (MACROBID) 100 MG capsule Take 1 capsule (100 mg total) by mouth 2 (two) times daily for 5 days. 10/05/23 10/10/23  Rushie Chestnut, PA-C  nystatin-triamcinolone ointment (MYCOLOG) Apply 1 Application topically 2 (two) times daily. For up to 7 days and then STOP.  Repeat as needed for flares after at least a 2 week break 01/30/23   [name], DO  ondansetron (ZOFRAN) 8 MG tablet Take 1 tablet (8 mg total) by mouth every 8 (eight) hours as needed for nausea or vomiting. 08/09/23   [name], MD  Opium 10  MG/ML (1%) TINC Take 0.6 mLs (6 mg total) by mouth every 3 (three) hours as needed for diarrhea or loose stools. 03/15/23   [name], MD  orphenadrine (NORFLEX) 100 MG tablet TAKE 1 TABLET(100 MG) BY MOUTH AT BEDTIME AS NEEDED FOR MUSCLE SPASMS 07/26/23   [name], MD  pantoprazole (PROTONIX) 40 MG tablet Take 1 tablet (40 mg total) by mouth daily before breakfast. 04/28/23   Shade Flood, MD  potassium chloride (KLOR-CON M) 10 MEQ tablet Take 2 tablets (20 mEq total) by mouth daily. 07/07/22   Shade Flood, MD  pramoxine-hydrocortisone (PROCTOCREAM-HC) 1-1 % rectal cream Place 1 Application rectally 2 (two) times daily. Patient taking differently: Place 1 Application rectally 2 (two) times daily as needed for hemorrhoids or anal itching (if hydrocortisone 2.5% cream is not available). 07/04/22   [name], MD  promethazine (PHENERGAN) 12.5 MG tablet Take 1 tablet (12.5 mg total) by mouth every 6 (six) hours as needed for nausea or vomiting. 09/05/23   [name], MD  propranolol (INDERAL) 20 MG tablet TAKE 1 TABLET(20 MG) BY MOUTH TWICE DAILY 10/06/23   Shade Flood, MD      Allergies    Ativan [lorazepam], Corticosteroids, Penicillins, Alprazolam, Erythromycin, Gabapentin, Prednisolone, Prednisone, and Savella  [milnacipran]    Review of Systems   Review of Systems  Respiratory:  Positive for cough.     Physical Exam Updated Vital Signs BP (!) 156/108 (BP Location: Right Arm)   Pulse 77   Temp 98.1 F (36.7 C)   Resp 20   Ht 5\' 6"  (1.676 m)   Wt 110.3 kg   SpO2 96%   BMI 39.25 kg/m  Physical Exam  ED Results / Procedures / Treatments   Labs (all labs ordered are listed, but only abnormal results are displayed) Labs Reviewed  CBC WITH DIFFERENTIAL/PLATELET - Abnormal; Notable for the following components:      Result Value   WBC 10.7 (*)    All other components within normal limits  BASIC METABOLIC PANEL - Abnormal; Notable for the  following components:   Glucose, Bld 273 (*)    All other components within normal limits  PROTIME-INR    EKG None  Radiology CT Angio Chest PE W and/or Wo Contrast Result Date: 10/10/2023 CLINICAL DATA:  Dry cough.  Concern for PE.  History of DVTs. EXAM: CT ANGIOGRAPHY CHEST WITH CONTRAST TECHNIQUE: Multidetector CT imaging of the chest was performed using the standard protocol during bolus administration of intravenous contrast. Multiplanar CT image reconstructions and MIPs were obtained to evaluate the vascular anatomy. RADIATION DOSE REDUCTION: This exam was performed according to the departmental dose-optimization program which includes automated exposure control, adjustment of the mA and/or kV according to patient size and/or use of iterative reconstruction technique. CONTRAST:  OMNIPAQUE IOHEXOL 350 MG/ML SOLN COMPARISON:  CTA chest 03/08/2023 FINDINGS: Cardiovascular: Negative for acute pulmonary embolism. Normal caliber thoracic aorta. No pericardial effusion. Mediastinum/Nodes: Trachea and esophagus are unremarkable. No thoracic adenopathy Lungs/Pleura: Bibasilar atelectasis. Otherwise no focal consolidation, pleural effusion, or pneumothorax. Upper Abdomen: No acute abnormality.  Marked hepatic steatosis Musculoskeletal: No acute fracture. Review of the MIP images confirms the above findings. IMPRESSION: Negative for acute pulmonary embolism. No acute abnormality in the chest. Electronically Signed   By: Minerva Fester M.D.   On: 10/10/2023 21:17    Procedures Procedures    Medications Ordered in ED Medications  iohexol (OMNIPAQUE) 350 MG/ML injection 100 mL (100 mLs Intravenous Contrast Given 10/10/23 1955)    ED Course/ Medical Decision Making/ A&P                                 Medical Decision Making Amount and/or Complexity of Data Reviewed Labs: ordered. Radiology: ordered.  Risk Prescription drug management.   This patient presents to the ED with chief  complaint(s) of cough with shortness of breath with pertinent past medical history of DVT and PE which further complicates the presenting complaint. The complaint involves an extensive differential diagnosis and also carries with it a high risk of complications and morbidity.    The differential diagnosis includes DVT, PE, upper respiratory infection  Additional history obtained: Additional history obtained from spouse Records reviewed Primary Care Documents  ED Course and Reassessment:   Independent labs interpretation:  The following labs were independently interpreted:  CBC: Mild leukocytosis at 10.7 Pro time-INR: 13.6 and 1.0 BMP: No notable findings  Independent visualization of imaging: - I independently visualized the following imaging with scope of interpretation limited to determining acute life threatening conditions related to emergency care: CTA, which revealed no acute pulmonary embolism  Consultation: - Consulted or discussed management/test interpretation w/ external professional: None  Consideration for admission or further workup: Considered for admission or further However patient's vital signs, labs, physical exam, and imaging have all been reassuring.  Patient symptoms likely due to upper respiratory infection.  Patient will be treated outpatient with Jerilynn Som and should follow-up with PCP if symptoms persist for further evaluation and treatment.        Final Clinical Impression(s) / ED Diagnoses Final diagnoses:  Upper respiratory tract infection, unspecified type    Rx / DC Orders ED Discharge Orders          Ordered    benzonatate (TESSALON) 100 MG capsule  Every 8 hours        10/10/23 2136              Dolphus Jenny, PA-C 10/10/23 2137    Tegeler, Canary Brim, MD 10/10/23 2219

## 2023-10-10 NOTE — Discharge Instructions (Signed)
Today you were seen for cough and shortness of breath.  Your symptoms are likely due to an upper respiratory infection.  Please pick up your prescription and take as prescribed.  Thank you for letting us treat you today. After reviewing your labs and imaging, I feel you are safe to go home. Please follow up with your PCP in the next several days and provide them with your records from this visit. Return to the Emergency Room if pain becomes severe or symptoms worsen.

## 2023-10-11 ENCOUNTER — Encounter: Payer: Self-pay | Admitting: Behavioral Health

## 2023-10-11 ENCOUNTER — Telehealth: Payer: Medicare Other | Admitting: Behavioral Health

## 2023-10-11 DIAGNOSIS — F411 Generalized anxiety disorder: Secondary | ICD-10-CM

## 2023-10-11 DIAGNOSIS — F5105 Insomnia due to other mental disorder: Secondary | ICD-10-CM

## 2023-10-11 DIAGNOSIS — F99 Mental disorder, not otherwise specified: Secondary | ICD-10-CM | POA: Diagnosis not present

## 2023-10-11 DIAGNOSIS — F331 Major depressive disorder, recurrent, moderate: Secondary | ICD-10-CM

## 2023-10-11 NOTE — Progress Notes (Signed)
Wendy Barber [id number] 05-11-1961 62 y.o.  Virtual Visit via Video Note  I connected with pt @ on 10/11/23 at 11:30 AM EST by a video enabled telemedicine application and verified that I am speaking with the correct person using two identifiers.   I discussed the limitations of evaluation and management by telemedicine and the availability of in person appointments. The patient expressed understanding and agreed to proceed.  I discussed the assessment and treatment plan with the patient. The patient was provided an opportunity to ask questions and all were answered. The patient agreed with the plan and demonstrated an understanding of the instructions.   The patient was advised to call back or seek an in-person evaluation if the symptoms worsen or if the condition fails to improve as anticipated.  I provided 20 minutes of non-face-to-face time during this encounter.  The patient was located at home.  The provider was located at [facility].   [name], NP   Subjective:   Patient ID:  Wendy Barber is a 62 y.o. (DOB 1960/11/24) female.  Chief Complaint:  Chief Complaint  Patient presents with   Anxiety   Depression   Follow-up   Medication Refill   Patient Education   Medication Problem    HPI 62 year old female presents to this office via video visit  for follow up and medication management.   Still facing a multitude of health issues. Very sick today with respiratory infection being treated. Recent ER visit due to same. Requesting no med changes until she feels better. She understands that depression related to chronic disease is very difficult to treat. Reports her anxiety today at 3/10 and depression at 4/10. She is sleeping 6 hours per night. Her family remains highly supportive. She denies any mania, no psychosis, No current SI or HI.       No prior psychiatric medications      Review of Systems:  Review of Systems  Constitutional: Negative.    Allergic/Immunologic: Negative.     Medications: I have reviewed the patient's current medications.  Current Outpatient Medications  Medication Sig Dispense Refill   amLODipine (NORVASC) 5 MG tablet TAKE 1 TABLET(5 MG) BY MOUTH DAILY 90 tablet 1   benzonatate (TESSALON) 100 MG capsule Take 1 capsule (100 mg total) by mouth every 8 (eight) hours. 21 capsule 0   buPROPion (WELLBUTRIN XL) 300 MG 24 hr tablet TAKE 1 TABLET(300 MG) BY MOUTH DAILY (Patient not taking: Reported on 10/10/2023) 30 tablet 0   busPIRone (BUSPAR) 15 MG tablet Take 1 tablet (15 mg total) by mouth 3 (three) times daily. 270 tablet 0   cabergoline (DOSTINEX) 0.5 MG tablet Take 0.5 tablets (0.25 mg total) by mouth 2 (two) times a week. 12 tablet 2   citalopram (CELEXA) 40 MG tablet Take 1 tablet (40 mg total) by mouth daily. 90 tablet 1   cyanocobalamin 1000 MCG tablet Take 1 tablet (1,000 mcg total) by mouth daily. (Patient not taking: Reported on 10/10/2023)     Dextromethorphan-buPROPion ER (AUVELITY) 45-105 MG TBCR Take one tablet by mouth for 3 days, then take two tablet daily 7-8 hours between doses 60 tablet 1   diazepam (VALIUM) 5 MG tablet Take 1 tab 30 minutes prior to enclosed imaging study. 4 tablet 1   dicyclomine (BENTYL) 20 MG tablet TAKE 1 TABLET(20 MG) BY MOUTH THREE TIMES DAILY BEFORE MEALS 90 tablet 4   diphenoxylate-atropine (LOMOTIL) 2.5-0.025 MG tablet TAKE 2 TABLETS BY MOUTH FOUR TIMES DAILY  AS NEEDED FOR LOOSE STOOLS OR DIARRHEA 240 tablet 0   eszopiclone (LUNESTA) 2 MG TABS tablet TAKE 1 TABLET(2 MG) BY MOUTH AT BEDTIME AS NEEDED FOR SLEEP 30 tablet 1   ferrous sulfate 325 (65 FE) MG tablet Take 1 tablet (325 mg total) by mouth daily. 30 tablet 3   fondaparinux (ARIXTRA) 10 MG/0.8ML SOLN injection Inject 0.8 mLs (10 mg total) into the skin daily. 72 mL 3   hydrocortisone (ANUSOL-HC) 2.5 % rectal cream PLACE RECTALLY 4 TIMES DAILY AS NEEDED FOR HEMORRHOIDS OR ANAL ITCHING 30 g 1   HYDROmorphone  (DILAUDID) 4 MG tablet Take 1 tablet (4 mg total) by mouth every 6 (six) hours as needed for severe pain (pain score 7-10). 60 tablet 0   loperamide (IMODIUM) 2 MG capsule Take 2 capsules (4 mg total) by mouth 4 (four) times daily as needed for diarrhea or loose stools.     meclizine (ANTIVERT) 25 MG tablet TAKE 1 TABLET(25 MG) BY MOUTH THREE TIMES DAILY AS NEEDED FOR DIZZINESS 60 tablet 0   nystatin-triamcinolone ointment (MYCOLOG) Apply 1 Application topically 2 (two) times daily. For up to 7 days and then STOP.  Repeat as needed for flares after at least a 2 week break 30 g 0   ondansetron (ZOFRAN) 8 MG tablet Take 1 tablet (8 mg total) by mouth every 8 (eight) hours as needed for nausea or vomiting. 30 tablet 2   Opium 10 MG/ML (1%) TINC Take 0.6 mLs (6 mg total) by mouth every 3 (three) hours as needed for diarrhea or loose stools. 118 mL 0   orphenadrine (NORFLEX) 100 MG tablet TAKE 1 TABLET(100 MG) BY MOUTH AT BEDTIME AS NEEDED FOR MUSCLE SPASMS 30 tablet 2   pantoprazole (PROTONIX) 40 MG tablet Take 1 tablet (40 mg total) by mouth daily before breakfast. 90 tablet 1   potassium chloride (KLOR-CON M) 10 MEQ tablet Take 2 tablets (20 mEq total) by mouth daily. 60 tablet 1   pramoxine-hydrocortisone (PROCTOCREAM-HC) 1-1 % rectal cream Place 1 Application rectally 2 (two) times daily. (Patient taking differently: Place 1 Application rectally 2 (two) times daily as needed for hemorrhoids or anal itching (if hydrocortisone 2.5% cream is not available).) 30 g 3   promethazine (PHENERGAN) 12.5 MG tablet Take 1 tablet (12.5 mg total) by mouth every 6 (six) hours as needed for nausea or vomiting. 90 tablet 3   propranolol (INDERAL) 20 MG tablet TAKE 1 TABLET(20 MG) BY MOUTH TWICE DAILY 180 tablet 1   No current facility-administered medications for this visit.    Medication Side Effects: None  Allergies:  Allergies  Allergen Reactions   Ativan [Lorazepam] Swelling and Other (See Comments)    Face  & Throat Swelling  Note: tolerates midazolam fine   Corticosteroids Other (See Comments)    "Psychotic behavior"    Penicillins Shortness Of Breath and Other (See Comments)    Irregular and rapid Heart Rate, too    Alprazolam Hives and Other (See Comments)    Hard to arouse, unresponsiveness also   Erythromycin Nausea And Vomiting        Gabapentin Other (See Comments)    Made the patient feel depressed   Prednisolone Anxiety   Prednisone Anxiety and Other (See Comments)    "Anxiety & Nervous Breakdown"   Savella  [Milnacipran] Other (See Comments)    Reaction not noted    Past Medical History:  Diagnosis Date   Arthritis    Back pain  Cancer [facility])    pseudomyxoma peritonei   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   Chronic fatigue syndrome    Diabetes mellitus without complication (HCC)    DVT of deep femoral vein, left (HCC) 03/19/2020   Fibromyalgia    Goals of care, counseling/discussion 03/19/2020   Hypertension    Iron deficiency anemia due to chronic blood loss 05/14/2021   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   Pernicious anemia 05/14/2021   Presence of IVC filter 03/19/2020   Pulmonary embolism, bilateral (HCC) 03/19/2020   Short bowel syndrome, unspecified     Family History  Problem Relation Age of Onset   Depression Mother    Drug abuse Sister    Suicidality Sister    Alcohol abuse Brother    Drug abuse Brother     Social History   Socioeconomic History   Marital status: Married    Spouse name: Not on file   Number of children: 3   Years of education: Not on file   Highest education level: Some college, no degree  Occupational History   Occupation: disability  Tobacco Use   Smoking status: Never   Smokeless tobacco: Never  Vaping Use   Vaping status: Never Used  Substance and Sexual Activity   Alcohol use: No   Drug use: Never   Sexual activity: Yes    Birth control/protection: None     Comment: hysterectomy  Other Topics Concern   Not on file  Social History Narrative   Right handed   One story home   Drinks occasional caffeine   Social Drivers of Health   Financial Resource Strain: Medium Risk (04/13/2023)   Overall Financial Resource Strain (CARDIA)    Difficulty of Paying Living Expenses: Somewhat hard  Food Insecurity: No Food Insecurity (04/13/2023)   Hunger Vital Sign    Worried About Running Out of Food in the Last Year: Never true    Ran Out of Food in the Last Year: Never true  Transportation Needs: No Transportation Needs (04/13/2023)   PRAPARE - Administrator, Civil Service (Medical): No    Lack of Transportation (Non-Medical): No  Physical Activity: Insufficiently Active (04/13/2023)   Exercise Vital Sign    Days of Exercise per Week: 1 day    Minutes of Exercise per Session: 20 min  Stress: Stress Concern Present (04/13/2023)   [employer] of Occupational Health - Occupational Stress Questionnaire    Feeling of Stress : Rather much  Social Connections: Moderately Integrated (04/13/2023)   Social Connection and Isolation Panel [NHANES]    Frequency of Communication with Friends and Family: Once a week    Frequency of Social Gatherings with Friends and Family: Once a week    Attends Religious Services: More than 4 times per year    Active Member of [employer] or Organizations: Yes    Attends Banker Meetings: More than 4 times per year    Marital Status: Married  Catering manager Violence: Not At Risk (03/09/2023)   Humiliation, Afraid, Rape, and Kick questionnaire    Fear of Current or Ex-Partner: No    Emotionally Abused: No    Physically Abused: No    Sexually Abused: No    Past Medical History, Surgical history, Social history, and Family history were reviewed and updated as appropriate.   Please see review of systems for further details on the patient's review from today.   Objective:   Physical Exam:  There  were no vitals taken for this visit.  Physical Exam  Lab Review:     Component Value Date/Time   NA 135 10/10/2023 1902   NA 137 04/24/2023 1437   K 3.7 10/10/2023 1902   CL 99 10/10/2023 1902   CO2 26 10/10/2023 1902   GLUCOSE 273 (H) 10/10/2023 1902   BUN 13 10/10/2023 1902   BUN 18 04/24/2023 1437   CREATININE 0.99 10/10/2023 1902   CREATININE 0.96 10/02/2023 1005   CALCIUM 8.9 10/10/2023 1902   PROT 7.1 10/02/2023 1005   ALBUMIN 3.8 10/02/2023 1005   AST 57 (H) 10/02/2023 1005   ALT 42 10/02/2023 1005   ALKPHOS 162 (H) 10/02/2023 1005   BILITOT 0.6 10/02/2023 1005   GFRNONAA >60 10/10/2023 1902   GFRNONAA >60 10/02/2023 1005   GFRAA >60 07/27/2020 0922       Component Value Date/Time   WBC 10.7 (H) 10/10/2023 1902   RBC 4.28 10/10/2023 1902   HGB 13.1 10/10/2023 1902   HGB 12.5 10/02/2023 1005   HCT 41.2 10/10/2023 1902   PLT 262 10/10/2023 1902   PLT 210 10/02/2023 1005   MCV 96.3 10/10/2023 1902   MCH 30.6 10/10/2023 1902   MCHC 31.8 10/10/2023 1902   RDW 14.6 10/10/2023 1902   LYMPHSABS 3.2 10/10/2023 1902   MONOABS 0.8 10/10/2023 1902   EOSABS 0.2 10/10/2023 1902   BASOSABS 0.1 10/10/2023 1902    No results found for: "POCLITH", "LITHIUM"   No results found for: "PHENYTOIN", "PHENOBARB", "VALPROATE", "CBMZ"   .res Assessment: Plan:    Greater than 50% of 30 min video visit with patient was spent on counseling and coordination of care. No changes this visit. Pt has severe respiratory infection and agrees to f/u in 4 weeks to reassess. She would like to talk about medication changes at that time.  We agreed to: Stopped the Auvelity Continue Buspar  to 15 mg to  three times daily as needed. Continue Celexa to 40 mg daily. Will take with food to help with nausea. Could make diarrhea worse in the beginning but may pass after a week or two.  To Continue Lunesta 5 mg at betime. Belsomra was not approved by insurance. She is happy with Alfonso Patten but says  causes metallic taste in mouth next day.  Will report worsening symptoms or side effects promptly To follow up in 4  weeks to reassess Will continue to follow up with PCP regularly Provided emergency contact information Reviewed PDMP         There are no diagnoses linked to this encounter.   Please see After Visit Summary for patient specific instructions.  Future Appointments  Date Time Provider Department Center  11/06/2023  1:15 PM [facility] LAB [facility] None  11/06/2023  1:30 PM [facility] INJ NURSE [facility] None  11/06/2023  1:45 PM Ennever, Rose Phi, MD [facility] None  12/22/2023 12:10 PM [name], [name], MD [facility] None  07/08/2024  1:30 PM Rennis Chris, MD [facility] None    No orders of the defined types were placed in this encounter.     -------------------------------

## 2023-10-16 ENCOUNTER — Ambulatory Visit (INDEPENDENT_AMBULATORY_CARE_PROVIDER_SITE_OTHER)
Admission: RE | Admit: 2023-10-16 | Discharge: 2023-10-16 | Disposition: A | Discharge status: Home / Self Care | Payer: Medicare Other | Source: Ambulatory Visit | Attending: Family Medicine | Admitting: Family Medicine

## 2023-10-16 ENCOUNTER — Ambulatory Visit: Payer: Medicare Other | Admitting: Family Medicine

## 2023-10-16 ENCOUNTER — Telehealth: Payer: Self-pay | Admitting: Family Medicine

## 2023-10-16 ENCOUNTER — Encounter: Payer: Self-pay | Admitting: Hematology & Oncology

## 2023-10-16 ENCOUNTER — Other Ambulatory Visit: Payer: Self-pay | Admitting: Family Medicine

## 2023-10-16 VITALS — BP 130/82 | HR 84 | Temp 99.4°F | Ht 66.0 in | Wt 243.0 lb

## 2023-10-16 DIAGNOSIS — R052 Subacute cough: Secondary | ICD-10-CM | POA: Diagnosis not present

## 2023-10-16 DIAGNOSIS — R12 Heartburn: Secondary | ICD-10-CM

## 2023-10-16 DIAGNOSIS — J22 Unspecified acute lower respiratory infection: Secondary | ICD-10-CM | POA: Diagnosis not present

## 2023-10-16 LAB — CBC WITH DIFFERENTIAL/PLATELET
Basophils Absolute: 0.1 10*3/uL (ref 0.0–0.1)
Basophils Relative: 0.8 % (ref 0.0–3.0)
Eosinophils Absolute: 0.4 10*3/uL (ref 0.0–0.7)
Eosinophils Relative: 4.2 % (ref 0.0–5.0)
HCT: 40.9 % (ref 36.0–46.0)
Hemoglobin: 13 g/dL (ref 12.0–15.0)
Lymphocytes Relative: 33.6 % (ref 12.0–46.0)
Lymphs Abs: 3.4 10*3/uL (ref 0.7–4.0)
MCHC: 31.8 g/dL (ref 30.0–36.0)
MCV: 97.8 fL (ref 78.0–100.0)
Monocytes Absolute: 0.8 10*3/uL (ref 0.1–1.0)
Monocytes Relative: 8.1 % (ref 3.0–12.0)
Neutro Abs: 5.4 10*3/uL (ref 1.4–7.7)
Neutrophils Relative %: 53.3 % (ref 43.0–77.0)
Platelets: 267 10*3/uL (ref 150.0–400.0)
RBC: 4.19 Mil/uL (ref 3.87–5.11)
RDW: 15.1 % (ref 11.5–15.5)
WBC: 10.1 10*3/uL (ref 4.0–10.5)

## 2023-10-16 MED ORDER — AZITHROMYCIN 250 MG PO TABS: ORAL_TABLET | ORAL | 0 refills | Status: AC

## 2023-10-16 MED ORDER — FAMOTIDINE 20 MG PO TABS: 20.0000 mg | ORAL_TABLET | Freq: Two times a day (BID) | ORAL | 0 refills | Status: DC

## 2023-10-16 MED ORDER — HYDROCOD POLI-CHLORPHE POLI ER 10-8 MG/5ML PO SUER: 5.0000 mL | Freq: Two times a day (BID) | ORAL | 0 refills | Status: DC | PRN

## 2023-10-16 NOTE — Telephone Encounter (Signed)
This was discussed at visit - see plan.  I called patient to review again, but unfortunately received her voicemail.  Will leave her a MyChart message regarding this plan.  Please call her tomorrow and make sure she received information.  Should not combine Intestinex and hydromorphone, plan for 1 or the other.  If she is using Tussionex for cough should try not to take the Dilaudid at that time.  However I understand that she may have breakthrough pain if only taking hydrocodone, so may need to stop hydrocodone cough syrup and return to the Dilaudid.  Let me know if further questions.

## 2023-10-16 NOTE — Patient Instructions (Signed)
I am sorry you are still sick.  As we discussed this could be from a virus but with the continued symptoms, worsening cough I would consider a possible bacterial cause including one of the atypical bacterias like pertussis.  Azithromycin was prescribed that should cover that infection.  I will check a blood count today as well as chest x-ray if you can have that performed at the [facility] location below.   Okay to use Mucinex or Mucinex DM over-the-counter during the day to help with cough, I did write for hydrocodone cough syrup at night that is a 12-hour formulation, that may help provide a little better relief of cough at night but do not combine that with Dilaudid and be very careful combining with other sedating medicines like the promethazine. Heartburn can also trigger cough, continue the pantoprazole but I have added famotidine twice per day. If any increasing shortness of breath, fevers, or worsening symptoms, be seen through ER or urgent care but I am hoping the plan above will help you feel better soon.  Follow-up with me in about a week.  Please let me know if there are questions in the meantime and hope you feel better soon!   [facility] or xray: Walk in 8:30-4:30 during weekdays, no appointment needed [address]  [place], [place] [address]

## 2023-10-16 NOTE — Telephone Encounter (Signed)
Caller name: Wendy Barber via Call Center   On DPR?: Yes  Call back number: [phone] (mobile)  Provider they see: Shade Flood, MD  Reason for call: Pt would like Neva Seat to be aware that she is taking HYDROmorphone (DILAUDID) 4 MG tablet prescribed by Myna Hidalgo and Neva Seat just prescribed chlorpheniramine-HYDROcodone 10-8 MG/5ML Commonly known as: TUSSIONEX -FYI -advise

## 2023-10-16 NOTE — Progress Notes (Signed)
Subjective:  Patient ID: Wendy Barber, female    DOB: August 23, 1961  Age: 62 y.o. MRN: [id number]  CC:  Chief Complaint  Patient presents with   Cough    Started a week ago on Saturday, cough, SOB, did see Dr Ruthine Dose and gave her tessalon pearls and unfortunately no improvement cough has worsened and increased anxiety when taking pearls     HPI Wendy Barber presents for    Cough, congestion: Cough, dyspnea starting approximately 9 days ago. Dry hacking cough. Tmax 98 at home. No initial nasal congestion, but increased cough over the weekend after ER visit. More wet cough past 6 days. Min phlegm. Some blood tinged at times. Increased dyspnea - at rest at times. Some dizziness at times - drinking fluids. Chronic dizziness but some increases at times with current illness. No syncope.  Decreased voice for a few days. Dizzy with coughing spells, no posttussive emesis.   No known sick contacts.  Chart reviewed.  Appointment with Dr. Ruthine Dose on 10/10/2023.  Afebrile at that time, 98.6.  Cough with some worsening dyspnea on exertion at that time.  COVID and flu testing negative.  CT chest ordered with her history of PEs, performed on December 17, negative for acute PE and no acute abnormality in the chest.  Bibasilar atelectasis but no focal consolidation, pleural effusion or pneumothorax. ER note also received from December 17.  Thought to have upper respiratory infection.  Treated outpatient with Jerilynn Som and PCP follow-up.  Tx: tessalon perles - feels hyper/anxious in past - did not try recent Rx. Otc severe cold and flu, DM cough suppressant. Robitussin cough gtts. Min relief.  Chronic use of zofran daily in the morning, phenergan at night. for underlying GI issues.  Sheilah Pigeon use of dilaudid - 2 times per week for leg symptoms.  Controlled substance database reviewed. On protonix, with tums few times per day for breakthrough heartburn.   Has taken azithromycin in the past without known side  effects.  Erythromycin had side effects.  History Patient Active Problem List   Diagnosis Date Noted   Hyperprolactinemia (HCC) 06/23/2023   Pituitary macroadenoma (HCC) 06/23/2023   Pituitary adenoma (HCC) 04/17/2023   Pulmonary emboli (HCC) 12/08/2022   Pulmonary embolism (HCC) 12/07/2022   Thyroid nodule 12/07/2022   Enteritis 07/23/2022   Fall at home, initial encounter 07/23/2022   AMS (altered mental status) 07/23/2022   Hemorrhoids 06/25/2022   Occasional tremors: Essential tremors 06/23/2022   Hypomagnesemia 06/22/2022   Iron deficiency anemia    Gastritis 06/18/2022   Depression 06/18/2022   UTI (urinary tract infection) 06/16/2022   DVT, bilateral lower limbs (HCC) 06/14/2022   Symptomatic bradycardia 06/13/2022   History of excision of intestinal structure 06/13/2022   CAD (coronary artery disease) 06/13/2022   Acute lower GI bleeding 05/04/2022   AKI (acute kidney injury) (HCC) 05/04/2022   History of deep vein thrombosis (DVT) of lower extremity 05/04/2022   History of pulmonary embolus (PE) 05/04/2022   Anxiety 05/04/2022   Pernicious anemia 05/14/2021   Iron deficiency anemia due to chronic blood loss 05/14/2021   Abdominal wall abscess    Surgical wound infection 01/16/2021   C. 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   DVT of deep femoral vein, left (HCC) 03/19/2020   Pulmonary embolism, bilateral (HCC)  03/19/2020   Presence of IVC filter 03/19/2020   Hepatic encephalopathy (HCC) 11/22/2019   Confusion 11/21/2019   Autoimmune hepatitis (HCC) 11/21/2019   Uncontrolled hypertension 11/21/2019   DM2 (diabetes mellitus, type 2) (HCC) 11/21/2019   Closed right ankle fracture 08/27/2019   Acute deep vein thrombosis (DVT) of  femoral vein of left lower extremity (HCC) 08/03/2018   History of colon cancer 08/03/2018   History of partial colectomy 08/03/2018   Spinal stenosis 08/03/2018   Morbid obesity (HCC) 08/03/2018   Cerebral meningioma (HCC) 01/05/2018   Arthritis of right shoulder region 02/18/2017   History of renal calculi 02/14/2016   Midline low back pain 02/14/2016   Class 3 obesity 01/07/2016   Migraine with aura and without status migrainosus, not intractable 01/07/2016   Chronic fatigue syndrome 09/23/2013   GERD (gastroesophageal reflux disease) 09/23/2013   History of hepatitis A 09/23/2013   Rheumatoid arthritis involving multiple sites (HCC) 09/23/2013   Past Medical History:  Diagnosis Date   Arthritis    Back pain    Cancer (HCC)    pseudomyxoma peritonei   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   Chronic fatigue syndrome    Diabetes mellitus without complication (HCC)    DVT of deep femoral vein, left (HCC) 03/19/2020   Fibromyalgia    Goals of care, counseling/discussion 03/19/2020   Hypertension    Iron deficiency anemia due to chronic blood loss 05/14/2021   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   Pernicious anemia 05/14/2021   Presence of IVC filter 03/19/2020   Pulmonary embolism, bilateral (HCC) 03/19/2020   Short bowel syndrome, unspecified    Past Surgical History:  Procedure Laterality Date   ABDOMINAL HYSTERECTOMY     ABDOMINAL SURGERY     ACHILLES TENDON REPAIR     APPENDECTOMY     ARTHROPLASTY     CARPAL TUNNEL RELEASE     CHOLECYSTECTOMY     IR CATHETER TUBE CHANGE  02/02/2021   IR CATHETER TUBE CHANGE  02/25/2021   IR IMAGING GUIDED PORT INSERTION  06/20/2022   IR RADIOLOGIST EVAL & MGMT  02/24/2021   IR RADIOLOGIST EVAL & MGMT  03/10/2021   IR RADIOLOGIST EVAL & MGMT  06/22/2022   IR THROMBECT VENO MECH MOD SED  06/20/2022   IR US GUIDE BX ASP/DRAIN  11/25/2019   IR US GUIDE VASC ACCESS LEFT  06/20/2022   IR US  GUIDE VASC ACCESS RIGHT  06/20/2022   IR US GUIDE VASC ACCESS RIGHT  06/20/2022   IR VENO/EXT/BI  06/20/2022   IR VENOCAVAGRAM IVC  06/20/2022   JOINT REPLACEMENT     KNEE ARTHROSCOPY     ORIF ANKLE FRACTURE Right 08/27/2019   Procedure: OPEN REDUCTION INTERNAL FIXATION RIGHT ANKLE FRACTURE;  Surgeon: Sheral Apley, MD;  Location: [facility];  Service: Orthopedics;  Laterality: Right;   perineorrophy     TONGUE BIOPSY     Allergies  Allergen Reactions   Ativan [Lorazepam] Swelling and Other (See Comments)    Face & Throat Swelling  Note: tolerates midazolam fine   Corticosteroids Other (See Comments)    "Psychotic behavior"    Penicillins Shortness Of Breath and Other (See Comments)    Irregular and rapid Heart Rate, too    Alprazolam Hives and Other (See Comments)    Hard to arouse, unresponsiveness also   Erythromycin Nausea And Vomiting        Gabapentin Other (See Comments)    Made  the patient feel depressed   Prednisolone Anxiety   Prednisone Anxiety and Other (See Comments)    "Anxiety & Nervous Breakdown"   Savella  [Milnacipran] Other (See Comments)    Reaction not noted   Prior to Admission medications   Medication Sig Start Date End Date Taking? Authorizing Provider  amLODipine (NORVASC) 5 MG tablet TAKE 1 TABLET(5 MG) BY MOUTH DAILY 04/24/23   Shade Flood, MD  benzonatate (TESSALON) 100 MG capsule Take 1 capsule (100 mg total) by mouth every 8 (eight) hours. 10/10/23   Dolphus Jenny, PA-C  buPROPion (WELLBUTRIN XL) 300 MG 24 hr tablet TAKE 1 TABLET(300 MG) BY MOUTH DAILY Patient not taking: Reported on 10/10/2023 10/01/23   [name], NP  busPIRone (BUSPAR) 15 MG tablet Take 1 tablet (15 mg total) by mouth 3 (three) times daily. 07/11/23   [name], NP  cabergoline (DOSTINEX) 0.5 MG tablet Take 0.5 tablets (0.25 mg total) by mouth 2 (two) times a week. 06/26/23   [name], [name], MD  citalopram (CELEXA) 40 MG tablet Take 1 tablet (40 mg total)  by mouth daily. 07/11/23   [name], NP  cyanocobalamin 1000 MCG tablet Take 1 tablet (1,000 mcg total) by mouth daily. Patient not taking: Reported on 10/10/2023 07/26/22   Lonia Blood, MD  Dextromethorphan-buPROPion ER (AUVELITY) 45-105 MG TBCR Take one tablet by mouth for 3 days, then take two tablet daily 7-8 hours between doses 03/07/23   [name], NP  diazepam (VALIUM) 5 MG tablet Take 1 tab 30 minutes prior to enclosed imaging study. 06/01/23   Shade Flood, MD  dicyclomine (BENTYL) 20 MG tablet TAKE 1 TABLET(20 MG) BY MOUTH THREE TIMES DAILY BEFORE MEALS 07/26/23   [name], MD  diphenoxylate-atropine (LOMOTIL) 2.5-0.025 MG tablet TAKE 2 TABLETS BY MOUTH FOUR TIMES DAILY AS NEEDED FOR LOOSE STOOLS OR DIARRHEA 10/02/23   [name], MD  eszopiclone (LUNESTA) 2 MG TABS tablet TAKE 1 TABLET(2 MG) BY MOUTH AT BEDTIME AS NEEDED FOR SLEEP 08/30/23   Mozingo, Thereasa Solo, NP  ferrous sulfate 325 (65 FE) MG tablet Take 1 tablet (325 mg total) by mouth daily. 06/03/22 06/03/23  [name], Pratik D, DO  fondaparinux (ARIXTRA) 10 MG/0.8ML SOLN injection Inject 0.8 mLs (10 mg total) into the skin daily. 03/15/23   [name], MD  hydrocortisone (ANUSOL-HC) 2.5 % rectal cream PLACE RECTALLY 4 TIMES DAILY AS NEEDED FOR HEMORRHOIDS OR ANAL ITCHING 10/02/23   [name], MD  HYDROmorphone (DILAUDID) 4 MG tablet Take 1 tablet (4 mg total) by mouth every 6 (six) hours as needed for severe pain (pain score 7-10). 09/05/23   [name], MD  loperamide (IMODIUM) 2 MG capsule Take 2 capsules (4 mg total) by mouth 4 (four) times daily as needed for diarrhea or loose stools. 05/08/22   Glade Lloyd, MD  meclizine (ANTIVERT) 25 MG tablet TAKE 1 TABLET(25 MG) BY MOUTH THREE TIMES DAILY AS NEEDED FOR DIZZINESS 10/02/23   [name], MD  nystatin-triamcinolone ointment [facility]) Apply 1 Application topically 2 (two) times daily. For up to 7 days and then STOP.  Repeat as  needed for flares after at least a 2 week break 01/30/23   [name], DO  ondansetron (ZOFRAN) 8 MG tablet Take 1 tablet (8 mg total) by mouth every 8 (eight) hours as needed for nausea or vomiting. 08/09/23   [name], MD  Opium 10  MG/ML (1%) TINC Take 0.6 mLs (6 mg total) by mouth every 3 (three) hours as needed for diarrhea or loose stools. 03/15/23   [name], MD  orphenadrine (NORFLEX) 100 MG tablet TAKE 1 TABLET(100 MG) BY MOUTH AT BEDTIME AS NEEDED FOR MUSCLE SPASMS 07/26/23   [name], MD  pantoprazole (PROTONIX) 40 MG tablet Take 1 tablet (40 mg total) by mouth daily before breakfast. 04/28/23   Shade Flood, MD  potassium chloride (KLOR-CON M) 10 MEQ tablet Take 2 tablets (20 mEq total) by mouth daily. 07/07/22   Shade Flood, MD  pramoxine-hydrocortisone (PROCTOCREAM-HC) 1-1 % rectal cream Place 1 Application rectally 2 (two) times daily. Patient taking differently: Place 1 Application rectally 2 (two) times daily as needed for hemorrhoids or anal itching (if hydrocortisone 2.5% cream is not available). 07/04/22   [name], MD  promethazine (PHENERGAN) 12.5 MG tablet Take 1 tablet (12.5 mg total) by mouth every 6 (six) hours as needed for nausea or vomiting. 09/05/23   [name], MD  propranolol (INDERAL) 20 MG tablet TAKE 1 TABLET(20 MG) BY MOUTH TWICE DAILY 10/06/23   Shade Flood, MD   Social History   Socioeconomic History   Marital status: Married    Spouse name: Not on file   Number of children: 3   Years of education: Not on file   Highest education level: Some college, no degree  Occupational History   Occupation: disability  Tobacco Use   Smoking status: Never   Smokeless tobacco: Never  Vaping Use   Vaping status: Never Used  Substance and Sexual Activity   Alcohol use: No   Drug use: Never   Sexual activity: Yes    Birth control/protection: None    Comment: hysterectomy  Other Topics Concern   Not on file   Social History Narrative   Right handed   One story home   Drinks occasional caffeine   Social Drivers of Health   Financial Resource Strain: Patient Declined (10/16/2023)   Overall Financial Resource Strain (CARDIA)    Difficulty of Paying Living Expenses: Patient declined  Food Insecurity: No Food Insecurity (10/16/2023)   Hunger Vital Sign    Worried About Running Out of Food in the Last Year: Never true    Ran Out of Food in the Last Year: Never true  Transportation Needs: No Transportation Needs (10/16/2023)   PRAPARE - Administrator, Civil Service (Medical): No    Lack of Transportation (Non-Medical): No  Physical Activity: Inactive (10/16/2023)   Exercise Vital Sign    Days of Exercise per Week: 0 days    Minutes of Exercise per Session: 20 min  Stress: Stress Concern Present (10/16/2023)   [employer] of Occupational Health - Occupational Stress Questionnaire    Feeling of Stress : Very much  Social Connections: Socially Integrated (10/16/2023)   Social Connection and Isolation Panel [NHANES]    Frequency of Communication with Friends and Family: More than three times a week    Frequency of Social Gatherings with Friends and Family: Once a week    Attends Religious Services: More than 4 times per year    Active Member of [employer] or Organizations: No    Attends Engineer, structural: More than 4 times per year    Marital Status: Married  Catering manager Violence: Not At Risk (03/09/2023)   Humiliation, Afraid, Rape, and Kick questionnaire    Fear of Current or Ex-Partner: No  Emotionally Abused: No    Physically Abused: No    Sexually Abused: No    Review of Systems Per HPI  Objective:   Vitals:   10/16/23 1056  BP: 130/82  Pulse: 84  Temp: 99.4 F (37.4 C)  TempSrc: Temporal  SpO2: 97%  Weight: 243 lb (110.2 kg)  Height: 5\' 6"  (1.676 m)   Ambulatory pulse ox - 92-94%.    Physical Exam Vitals reviewed.  Constitutional:       General: She is not in acute distress.    Appearance: She is well-developed. She is not ill-appearing or diaphoretic.     Comments: Coughing fit upon entering the room but then calm down and able to speak in full sentences, nontoxic, no audible wheeze or stridor.  HENT:     Head: Normocephalic and atraumatic.     Right Ear: Hearing, tympanic membrane, ear canal and external ear normal.     Left Ear: Hearing, tympanic membrane, ear canal and external ear normal.     Nose: Nose normal.     Mouth/Throat:     Pharynx: No posterior oropharyngeal erythema.  Eyes:     Conjunctiva/sclera: Conjunctivae normal.     Pupils: Pupils are equal, round, and reactive to light.  Cardiovascular:     Rate and Rhythm: Normal rate and regular rhythm.     Heart sounds: Normal heart sounds. No murmur heard. Pulmonary:     Effort: Pulmonary effort is normal. No respiratory distress.     Breath sounds: Normal breath sounds. No wheezing or rhonchi.  Skin:    General: Skin is warm and dry.     Findings: No rash.  Neurological:     Mental Status: She is alert and oriented to person, place, and time.  Psychiatric:        Mood and Affect: Mood normal.        Behavior: Behavior normal.        Assessment & Plan:  Wendy Barber is a 62 y.o. female . Subacute cough - Plan: DG Chest 2 View, CBC with Differential/Platelet, chlorpheniramine-HYDROcodone (TUSSIONEX) 10-8 MG/5ML  LRTI (lower respiratory tract infection)  Heartburn - Plan: famotidine (PEPCID) 20 MG tablet  Persistent cough with some worsening, dyspnea.  Lungs overall clear.  Previous CT without sign of pulmonary embolus.  No apparent infection on previous CT but with worsening symptoms will check chest x-ray, CBC.  Coughing fits suspicious for possible pertussis or atypical infection.  Also with some breakthrough heartburn which may be contributing to cough.    -Start azithromycin, allergy to erythromycin but she has taken Z-Pak previously.   Stop if any nausea or vomiting and then would choose Septra for pertussis coverage.  -Mucinex or Mucinex DM during the day.  Avoid Tessalon Perles given side effects above.  -No apparent wheeze or reactive airway, hold on prednisone or beta agonist at this time.  -Add famotidine for improved heartburn coverage, continue pantoprazole  -Tussionex at night for improved cough relief, potential side effects discussed and advised to not combine with hydromorphone or Phenergan if possible.  -Recheck 1 week with ER/urgent care precautions in the interim if any worsening.  All questions answered and understanding expressed. Meds ordered this encounter  Medications   famotidine (PEPCID) 20 MG tablet    Sig: Take 1 tablet (20 mg total) by mouth 2 (two) times daily.    Dispense:  60 tablet    Refill:  0   chlorpheniramine-HYDROcodone (TUSSIONEX) 10-8 MG/5ML    Sig:  Take 5 mLs by mouth every 12 (twelve) hours as needed for cough. Start at bedtime.    Dispense:  120 mL    Refill:  0   Patient Instructions  I am sorry you are still sick.  As we discussed this could be from a virus but with the continued symptoms, worsening cough I would consider a possible bacterial cause including one of the atypical bacterias like pertussis.  Azithromycin was prescribed that should cover that infection.  I will check a blood count today as well as chest x-ray if you can have that performed at the [facility] location below.   Okay to use Mucinex or Mucinex DM over-the-counter during the day to help with cough, I did write for hydrocodone cough syrup at night that is a 12-hour formulation, that may help provide a little better relief of cough at night but do not combine that with Dilaudid and be very careful combining with other sedating medicines like the promethazine. Heartburn can also trigger cough, continue the pantoprazole but I have added famotidine twice per day. If any increasing shortness of breath, fevers, or  worsening symptoms, be seen through ER or urgent care but I am hoping the plan above will help you feel better soon.  Follow-up with me in about a week.  Please let me know if there are questions in the meantime and hope you feel better soon!   [facility] or xray: Walk in 8:30-4:30 during weekdays, no appointment needed [address]  [place], [place] [address]     Signed,   Meredith Staggers, MD [facility], [facility] 10/16/23 12:14 PM

## 2023-10-17 NOTE — Telephone Encounter (Signed)
Pt and spouse were informed

## 2023-10-24 ENCOUNTER — Other Ambulatory Visit: Payer: Self-pay | Admitting: Hematology & Oncology

## 2023-10-26 ENCOUNTER — Ambulatory Visit: Payer: Medicare Other | Admitting: Family Medicine

## 2023-10-26 ENCOUNTER — Encounter: Payer: Self-pay | Admitting: Family Medicine

## 2023-11-06 ENCOUNTER — Inpatient Hospital Stay: Payer: Medicare Other

## 2023-11-06 ENCOUNTER — Encounter: Payer: Self-pay | Admitting: Hematology & Oncology

## 2023-11-06 ENCOUNTER — Other Ambulatory Visit: Payer: Self-pay

## 2023-11-06 ENCOUNTER — Inpatient Hospital Stay: Payer: Medicare Other | Attending: Hematology & Oncology

## 2023-11-06 ENCOUNTER — Inpatient Hospital Stay (HOSPITAL_BASED_OUTPATIENT_CLINIC_OR_DEPARTMENT_OTHER): Payer: Medicare Other | Admitting: Hematology & Oncology

## 2023-11-06 VITALS — BP 149/80 | HR 68 | Temp 98.3°F | Resp 20

## 2023-11-06 VITALS — BP 149/80 | HR 68 | Temp 98.3°F | Resp 20 | Ht 66.0 in | Wt 239.0 lb

## 2023-11-06 DIAGNOSIS — D510 Vitamin B12 deficiency anemia due to intrinsic factor deficiency: Secondary | ICD-10-CM | POA: Insufficient documentation

## 2023-11-06 DIAGNOSIS — R300 Dysuria: Secondary | ICD-10-CM | POA: Insufficient documentation

## 2023-11-06 DIAGNOSIS — Z7901 Long term (current) use of anticoagulants: Secondary | ICD-10-CM | POA: Insufficient documentation

## 2023-11-06 DIAGNOSIS — I2699 Other pulmonary embolism without acute cor pulmonale: Secondary | ICD-10-CM | POA: Insufficient documentation

## 2023-11-06 DIAGNOSIS — R5383 Other fatigue: Secondary | ICD-10-CM | POA: Diagnosis not present

## 2023-11-06 DIAGNOSIS — Z881 Allergy status to other antibiotic agents status: Secondary | ICD-10-CM | POA: Diagnosis not present

## 2023-11-06 DIAGNOSIS — R739 Hyperglycemia, unspecified: Secondary | ICD-10-CM | POA: Diagnosis not present

## 2023-11-06 DIAGNOSIS — C772 Secondary and unspecified malignant neoplasm of intra-abdominal lymph nodes: Secondary | ICD-10-CM

## 2023-11-06 DIAGNOSIS — M549 Dorsalgia, unspecified: Secondary | ICD-10-CM | POA: Diagnosis not present

## 2023-11-06 DIAGNOSIS — E1165 Type 2 diabetes mellitus with hyperglycemia: Secondary | ICD-10-CM

## 2023-11-06 DIAGNOSIS — Z888 Allergy status to other drugs, medicaments and biological substances status: Secondary | ICD-10-CM | POA: Diagnosis not present

## 2023-11-06 DIAGNOSIS — C786 Secondary malignant neoplasm of retroperitoneum and peritoneum: Secondary | ICD-10-CM

## 2023-11-06 DIAGNOSIS — Z880 Allergy status to penicillin: Secondary | ICD-10-CM | POA: Insufficient documentation

## 2023-11-06 DIAGNOSIS — R109 Unspecified abdominal pain: Secondary | ICD-10-CM | POA: Diagnosis not present

## 2023-11-06 DIAGNOSIS — K529 Noninfective gastroenteritis and colitis, unspecified: Secondary | ICD-10-CM | POA: Diagnosis not present

## 2023-11-06 DIAGNOSIS — R8271 Bacteriuria: Secondary | ICD-10-CM

## 2023-11-06 DIAGNOSIS — G8929 Other chronic pain: Secondary | ICD-10-CM | POA: Insufficient documentation

## 2023-11-06 DIAGNOSIS — Z79899 Other long term (current) drug therapy: Secondary | ICD-10-CM | POA: Diagnosis not present

## 2023-11-06 DIAGNOSIS — C181 Malignant neoplasm of appendix: Secondary | ICD-10-CM

## 2023-11-06 LAB — URINALYSIS, COMPLETE (UACMP) WITH MICROSCOPIC: RBC / HPF: >50 RBC/hpf (ref 0–5)

## 2023-11-06 LAB — CMP (CANCER CENTER ONLY)
ALT: 24 U/L (ref 0–44)
AST: 31 U/L (ref 15–41)
Albumin: 4 g/dL (ref 3.5–5.0)
Alkaline Phosphatase: 145 U/L — ABNORMAL HIGH (ref 38–126)
Anion gap: 8 (ref 5–15)
BUN: 20 mg/dL (ref 8–23)
CO2: 29 mmol/L (ref 22–32)
Calcium: 9.3 mg/dL (ref 8.9–10.3)
Chloride: 102 mmol/L (ref 98–111)
Creatinine: 1.07 mg/dL — ABNORMAL HIGH (ref 0.44–1.00)
GFR, Estimated: 59 mL/min — ABNORMAL LOW (ref >60)
Glucose, Bld: 289 mg/dL — ABNORMAL HIGH (ref 70–99)
Potassium: 3.8 mmol/L (ref 3.5–5.1)
Sodium: 139 mmol/L (ref 135–145)
Total Bilirubin: 0.7 mg/dL (ref 0.0–1.2)
Total Protein: 7.3 g/dL (ref 6.5–8.1)

## 2023-11-06 LAB — CBC WITH DIFFERENTIAL (CANCER CENTER ONLY)
Abs Immature Granulocytes: 0.02 10*3/uL (ref 0.00–0.07)
Basophils Absolute: 0.1 10*3/uL (ref 0.0–0.1)
Basophils Relative: 1 %
Eosinophils Absolute: 0.2 10*3/uL (ref 0.0–0.5)
Eosinophils Relative: 2 %
HCT: 41.7 % (ref 36.0–46.0)
Hemoglobin: 13.6 g/dL (ref 12.0–15.0)
Immature Granulocytes: 0 %
Lymphocytes Relative: 45 %
Lymphs Abs: 3.6 10*3/uL (ref 0.7–4.0)
MCH: 31.2 pg (ref 26.0–34.0)
MCHC: 32.6 g/dL (ref 30.0–36.0)
MCV: 95.6 fL (ref 80.0–100.0)
Monocytes Absolute: 0.6 10*3/uL (ref 0.1–1.0)
Monocytes Relative: 8 %
Neutro Abs: 3.5 10*3/uL (ref 1.7–7.7)
Neutrophils Relative %: 44 %
Platelet Count: 260 10*3/uL (ref 150–400)
RBC: 4.36 MIL/uL (ref 3.87–5.11)
RDW: 14.1 % (ref 11.5–15.5)
WBC Count: 8 10*3/uL (ref 4.0–10.5)
nRBC: 0 % (ref 0.0–0.2)

## 2023-11-06 LAB — FERRITIN: Ferritin: 57 ng/mL (ref 11–307)

## 2023-11-06 MED ORDER — LANREOTIDE ACETATE 120 MG/0.5ML ~~LOC~~ SOLN
120.0000 mg | Freq: Once | SUBCUTANEOUS | Status: AC
  Administered 2023-11-06: 120 mg via SUBCUTANEOUS
  Filled 2023-11-06: qty 120

## 2023-11-06 MED ORDER — FLUCONAZOLE 100 MG PO TABS: 100.0000 mg | ORAL_TABLET | Freq: Every day | ORAL | 4 refills | Status: DC

## 2023-11-06 MED ORDER — SODIUM CHLORIDE 0.9% FLUSH
10.0000 mL | INTRAVENOUS | Status: DC | PRN
  Administered 2023-11-06: 10 mL via INTRAVENOUS

## 2023-11-06 MED ORDER — GLIMEPIRIDE 4 MG PO TABS: 4.0000 mg | ORAL_TABLET | Freq: Every day | ORAL | 4 refills | Status: DC

## 2023-11-06 MED ORDER — HEPARIN SOD (PORK) LOCK FLUSH 100 UNIT/ML IV SOLN
500.0000 [IU] | Freq: Once | INTRAVENOUS | Status: AC
  Administered 2023-11-06: 500 [IU] via INTRAVENOUS

## 2023-11-06 NOTE — Progress Notes (Signed)
 Hematology and Oncology Follow Up Visit  Wendy Barber [id number] 1961-08-22 63 y.o. 11/06/2023   Principle Diagnosis:  History of metastatic appendiceal cancer  -- recurrent  Superior mesenteric vein thrombus Thromboembolism of the RIGHT leg Pernicious anemia Acute pulmonary embolism-segmental right pulmonary artery Carcinoid/neuroendocrine carcinoma  Current Therapy:   HIPEC - Surgery done in [place] in 11/2020 Arixtra  10 mg subcu daily --start on 2/16 2024 Vitamin B12 5000 mcg PO daily  Somatuline 120 mg IM monthly-start on 07/12/2023     Interim History:  Wendy Barber is in for follow-up. She is here with her husband.  She is managing okay.  She got through the holidays.  [place] coming[place]  [place]  [place].  She may have a UTI.  She thinks she may have a fungal urinary infection.  We will send off cultures.  Her blood sugars are quite high.  Her blood sugars at 289.  This might be from the Somatuline.  She is including the need to have something to help control her hyperglycemia.  This is certainly not going to help with a fungal infection or with a UTI.  I will start her on Amaryl  4 mg a day.  She will see her family doctor to try to help with managing the hyperglycemia.  Her last Chromogranin A level was 49 back in December..  She still having diarrhea.  I think this is always can be an issue for her..  She continues on the Arixtra .  She apparently had a CT angiogram that was done on 10/10/2023.  This was negative for any thromboembolic disease in the lungs.  She seems a decent pain control.  I think she goes up to see her surgeon in [place] I think in March.  She has had no fever.  She has had no obvious bleeding..  She has some chronic leg pain issues.  I am sure this is provide from her past thromboembolic disease.  There is been no headache.  Overall, I would have to say that her performance status is probably  ECOG 1-2.  Wt Readings from Last 3 Encounters:  11/06/23 239 lb (108.4 kg)  10/16/23 243 lb (110.2 kg)  10/10/23 243 lb 2.7 oz (110.3 kg)    Medications:  Current Outpatient Medications:    amLODipine  (NORVASC ) 5 MG tablet, TAKE 1 TABLET(5 MG) BY MOUTH DAILY, Disp: 90 tablet, Rfl: 1   benzonatate  (TESSALON ) 100 MG capsule, Take 1 capsule (100 mg total) by mouth every 8 (eight) hours., Disp: 21 capsule, Rfl: 0   buPROPion  (WELLBUTRIN  XL) 300 MG 24 hr tablet, TAKE 1 TABLET(300 MG) BY MOUTH DAILY (Patient not taking: Reported on 10/10/2023), Disp: 30 tablet, Rfl: 0   busPIRone  (BUSPAR ) 15 MG tablet, Take 1 tablet (15 mg total) by mouth 3 (three) times daily., Disp: 270 tablet, Rfl: 0   cabergoline  (DOSTINEX ) 0.5 MG tablet, Take 0.5 tablets (0.25 mg total) by mouth 2 (two) times a week., Disp: 12 tablet, Rfl: 2   citalopram  (CELEXA ) 40 MG tablet, Take 1 tablet (40 mg total) by mouth daily., Disp: 90 tablet, Rfl: 1   cyanocobalamin  1000 MCG tablet, Take 1 tablet (1,000 mcg total) by mouth daily. (Patient not taking: Reported on 10/10/2023), Disp: , Rfl:    Dextromethorphan -buPROPion  ER (AUVELITY ) 45-105 MG TBCR, Take one tablet by mouth for 3 days, then take two tablet daily 7-8 hours between doses, Disp: 60 tablet, Rfl: 1  diazepam  (VALIUM ) 5 MG tablet, Take 1 tab 30 minutes prior to enclosed imaging study., Disp: 4 tablet, Rfl: 1   dicyclomine  (BENTYL ) 20 MG tablet, TAKE 1 TABLET(20 MG) BY MOUTH THREE TIMES DAILY BEFORE MEALS, Disp: 90 tablet, Rfl: 4   diphenoxylate -atropine  (LOMOTIL ) 2.5-0.025 MG tablet, TAKE 2 TABLETS BY MOUTH FOUR TIMES DAILY AS NEEDED FOR LOOSE STOOLS OR DIARRHEA, Disp: 240 tablet, Rfl: 0   eszopiclone  (LUNESTA ) 2 MG TABS tablet, TAKE 1 TABLET(2 MG) BY MOUTH AT BEDTIME AS NEEDED FOR SLEEP, Disp: 30 tablet, Rfl: 1   famotidine  (PEPCID ) 20 MG tablet, Take 1 tablet (20 mg total) by mouth 2 (two) times daily., Disp: 60 tablet, Rfl: 0   ferrous sulfate  325 (65 FE) MG tablet, Take  1 tablet (325 mg total) by mouth daily., Disp: 30 tablet, Rfl: 3   fondaparinux  (ARIXTRA ) 10 MG/0.8ML SOLN injection, Inject 0.8 mLs (10 mg total) into the skin daily., Disp: 72 mL, Rfl: 3   hydrocortisone  (ANUSOL -HC) 2.5 % rectal cream, PLACE RECTALLY 4 TIMES DAILY AS NEEDED FOR HEMORRHOIDS OR ANAL ITCHING, Disp: 30 g, Rfl: 1   HYDROmorphone  (DILAUDID ) 4 MG tablet, Take 1 tablet (4 mg total) by mouth every 6 (six) hours as needed for severe pain (pain score 7-10)., Disp: 60 tablet, Rfl: 0   loperamide  (IMODIUM ) 2 MG capsule, Take 2 capsules (4 mg total) by mouth 4 (four) times daily as needed for diarrhea or loose stools., Disp: , Rfl:    meclizine  (ANTIVERT ) 25 MG tablet, TAKE 1 TABLET(25 MG) BY MOUTH THREE TIMES DAILY AS NEEDED FOR DIZZINESS, Disp: 60 tablet, Rfl: 0   nystatin -triamcinolone  ointment (MYCOLOG), Apply 1 Application topically 2 (two) times daily. For up to 7 days and then STOP.  Repeat as needed for flares after at least a 2 week break, Disp: 30 g, Rfl: 0   ondansetron  (ZOFRAN ) 8 MG tablet, Take 1 tablet (8 mg total) by mouth every 8 (eight) hours as needed for nausea or vomiting., Disp: 30 tablet, Rfl: 2   Opium  10 MG/ML (1%) TINC, Take 0.6 mLs (6 mg total) by mouth every 3 (three) hours as needed for diarrhea or loose stools., Disp: 118 mL, Rfl: 0   orphenadrine  (NORFLEX ) 100 MG tablet, TAKE 1 TABLET(100 MG) BY MOUTH AT BEDTIME AS NEEDED FOR MUSCLE SPASMS, Disp: 30 tablet, Rfl: 2   pantoprazole  (PROTONIX ) 40 MG tablet, Take 1 tablet (40 mg total) by mouth daily before breakfast., Disp: 90 tablet, Rfl: 1   potassium chloride  (KLOR-CON  M) 10 MEQ tablet, Take 2 tablets (20 mEq total) by mouth daily., Disp: 60 tablet, Rfl: 1   pramoxine-hydrocortisone  (PROCTOCREAM-HC) 1-1 % rectal cream, APPLY RECTALLY TO THE AFFECTED AREA TWICE DAILY, Disp: 30 g, Rfl: 3   promethazine  (PHENERGAN ) 12.5 MG tablet, Take 1 tablet (12.5 mg total) by mouth every 6 (six) hours as needed for nausea or vomiting.,  Disp: 90 tablet, Rfl: 3   propranolol  (INDERAL ) 20 MG tablet, TAKE 1 TABLET(20 MG) BY MOUTH TWICE DAILY, Disp: 180 tablet, Rfl: 1 No current facility-administered medications for this visit.  Facility-Administered Medications Ordered in Other Visits:    sodium chloride  flush (NS) 0.9 % injection 10 mL, 10 mL, Intravenous, PRN, Donnivan Villena R, MD, 10 mL at 11/06/23 1334  Allergies:  Allergies  Allergen Reactions   Ativan [Lorazepam] Swelling and Other (See Comments)    Face & Throat Swelling  Note: tolerates midazolam  fine   Corticosteroids Other (See Comments)    Psychotic behavior  Penicillins Shortness Of Breath and Other (See Comments)    Irregular and rapid Heart Rate, too    Alprazolam Hives and Other (See Comments)    Hard to arouse, unresponsiveness also   Erythromycin Nausea And Vomiting        Gabapentin  Other (See Comments)    Made the patient feel depressed   Prednisolone Anxiety   Prednisone Anxiety and Other (See Comments)    Anxiety & Nervous Breakdown   Savella  [Milnacipran] Other (See Comments)    Reaction not noted    Past Medical History, Surgical history, Social history, and Family History were reviewed and updated.  Review of Systems: Review of Systems  Constitutional:  Positive for fatigue and unexpected weight change.  HENT:  Negative.    Eyes: Negative.   Respiratory: Negative.    Cardiovascular: Negative.   Gastrointestinal:  Positive for abdominal pain and diarrhea.  Endocrine: Negative.   Genitourinary:  Positive for dysuria.   Musculoskeletal:  Positive for back pain.  Skin: Negative.   Neurological: Negative.   Hematological: Negative.   Psychiatric/Behavioral:  Negative for depression.     Physical Exam: Vitals:   11/06/23 1344  BP: (!) 149/80  Pulse: 68  Resp: 20  Temp: 98.3 F (36.8 C)  SpO2: 95%   Wt Readings from Last 3 Encounters:  11/06/23 239 lb (108.4 kg)  10/16/23 243 lb (110.2 kg)  10/10/23 243 lb 2.7 oz  (110.3 kg)    Physical Exam Vitals reviewed.  HENT:     Head: Normocephalic and atraumatic.  Eyes:     Pupils: Pupils are equal, round, and reactive to light.  Cardiovascular:     Rate and Rhythm: Normal rate and regular rhythm.     Heart sounds: Normal heart sounds.  Pulmonary:     Effort: Pulmonary effort is normal.     Breath sounds: Normal breath sounds.  Abdominal:     General: Bowel sounds are normal.     Palpations: Abdomen is soft.  Musculoskeletal:        General: No tenderness or deformity. Normal range of motion.     Cervical back: Normal range of motion.  Lymphadenopathy:     Cervical: No cervical adenopathy.  Skin:    General: Skin is warm and dry.     Findings: No erythema or rash.  Neurological:     Mental Status: She is alert and oriented to person, place, and time.  Psychiatric:        Behavior: Behavior normal.        Thought Content: Thought content normal.        Judgment: Judgment normal.   Lab Results  Component Value Date   WBC 8.0 11/06/2023   HGB 13.6 11/06/2023   HCT 41.7 11/06/2023   MCV 95.6 11/06/2023   PLT 260 11/06/2023     Chemistry      Component Value Date/Time   NA 139 11/06/2023 1320   NA 137 04/24/2023 1437   K 3.8 11/06/2023 1320   CL 102 11/06/2023 1320   CO2 29 11/06/2023 1320   BUN 20 11/06/2023 1320   BUN 18 04/24/2023 1437   CREATININE 1.07 (H) 11/06/2023 1320      Component Value Date/Time   CALCIUM  9.3 11/06/2023 1320   ALKPHOS 145 (H) 11/06/2023 1320   AST 31 11/06/2023 1320   ALT 24 11/06/2023 1320   BILITOT 0.7 11/06/2023 1320      Impression and Plan: Ms. Wedin is a very  charming 63 year-old white female. She has recurrent appendiceal cancer.  She underwent a HIPEC procedure in [place] a couple years ago. Unfortunately she had had chronic diarrhea since including C. Diff which has been successfully treatment.    For right now, she is seems to be doing okay with respect to the carcinoid.  She will  continue on the Somatuline.  We will see about her urine.  I know that this is incredibly complicated.  She has quite a few issues going on.  Hopefully the Amaryl  will help with the hyperglycemia.  I know she sees her family doctor soon.  I am sure that he we will also be able to help with her hyperglycemia.  We will have her plan to come back in another month.  She probably would like to have her CAT scans done down here.  It might be difficult to have them done up in [place] when she sees her surgeon up there.   Maude JONELLE Crease, MD 1/13/20252:00 PM

## 2023-11-06 NOTE — Patient Instructions (Addendum)
 Lanreotide Injection What is this medication? LANREOTIDE (lan REE oh tide) treats high levels of growth hormone (acromegaly). It is used when other therapies have not worked well enough or cannot be tolerated. It works by reducing the amount of growth hormone your body makes. This reduces symptoms and the risk of health problems caused by too much growth hormone, such as diabetes and heart disease. It may also be used to treat neuroendocrine tumors, a cancer of the cells that release hormones and other substances in your body. It works by slowing down the release of these substances from the cells. This slows tumor growth. It also decreases the symptoms of carcinoid syndrome, such as flushing or diarrhea. This medicine may be used for other purposes; ask your health care provider or pharmacist if you have questions. COMMON BRAND NAME(S): Somatuline [place] What should I tell my care team before I take this medication? They need to know if you have any of these conditions: Diabetes Gallbladder disease Heart disease Kidney disease Liver disease Thyroid disease An unusual or allergic reaction to lanreotide, other medications, foods, dyes, or preservatives Pregnant or trying to get pregnant Breast-feeding How should I use this medication? This medication is injected under the skin. It is given by your care team in a hospital or clinic setting. Talk to your care team about the use of this medication in children. Special care may be needed. Overdosage: If you think you have taken too much of this medicine contact a poison control center or emergency room at once. NOTE: This medicine is only for you. Do not share this medicine with others. What if I miss a dose? Keep appointments for follow-up doses. It is important not to miss your dose. Call your care team if you are unable to keep an appointment. What may interact with this medication? Bromocriptine Cyclosporine Certain medications for blood  pressure, heart disease, irregular heartbeat Certain medications for diabetes Quinidine Terfenadine This list may not describe all possible interactions. Give your health care provider a list of all the medicines, herbs, non-prescription drugs, or dietary supplements you use. Also tell them if you smoke, drink alcohol, or use illegal drugs. Some items may interact with your medicine. What should I watch for while using this medication? Visit your care team for regular checks on your progress. Tell your care team if your symptoms do not start to get better or if they get worse. Your condition will be monitored carefully while you are receiving this medication. You may need blood work while you are taking this medication. This medication may increase blood sugar. The risk may be higher in patients who already have diabetes. Ask your care team what you can do to lower your risk of diabetes while taking this medication. Talk to your care team if you wish to become pregnant or think you may be pregnant. This medication can cause serious birth defects. Do not breast-feed while taking this medication and for 6 months after stopping therapy. This medication may cause infertility. Talk to your care team if you are concerned about your fertility. What side effects may I notice from receiving this medication? Side effects that you should report to your care team as soon as possible: Allergic reactions--skin rash, itching, hives, swelling of the face, lips, tongue, or throat Gallbladder problems--severe stomach pain, nausea, vomiting, fever High blood sugar (hyperglycemia)--increased thirst or amount of urine, unusual weakness or fatigue, blurry vision Increase in blood pressure Low blood sugar (hypoglycemia)--tremors or shaking, anxiety, sweating, cold  or clammy skin, confusion, dizziness, rapid heartbeat Low thyroid levels (hypothyroidism)--unusual weakness or fatigue, increased sensitivity to cold,  constipation, hair loss, dry skin, weight gain, feelings of depression Slow heartbeat--dizziness, feeling faint or lightheaded, confusion, trouble breathing, unusual weakness or fatigue Side effects that usually do not require medical attention (report to your care team if they continue or are bothersome): Diarrhea Dizziness Headache Muscle spasms Nausea Pain, redness, irritation, or bruising at the injection site Stomach pain This list may not describe all possible side effects. Call your doctor for medical advice about side effects. You may report side effects to FDA at 1-800-FDA-1088. Where should I keep my medication? This medication is given in a hospital or clinic. It will not be stored at home. NOTE: This sheet is a summary. It may not cover all possible information. If you have questions about this medicine, talk to your doctor, pharmacist, or health care provider.  2024 Elsevier/Gold Standard (2022-03-02 00:00:00) Implanted [facility] Guide An implanted port is a device that is placed under the skin. It is usually placed in the chest. The device may vary based on the need. Implanted ports can be used to give IV medicine, to take blood, or to give fluids. You may have an implanted port if: You need IV medicine that would be irritating to the small veins in your hands or arms. You need IV medicines, such as chemotherapy, for a long period of time. You need IV nutrition for a long period of time. You may have fewer limitations when using a port than you would if you used other types of long-term IVs. You will also likely be able to return to normal activities after your incision heals. An implanted port has two main parts: Reservoir. The reservoir is the part where a needle is inserted to give medicines or draw blood. The reservoir is round. After the port is placed, it appears as a small, raised area under your skin. Catheter. The catheter is a small, thin tube that connects the  reservoir to a vein. Medicine that is inserted into the reservoir goes into the catheter and then into the vein. How is my port accessed? To access your port: A numbing cream may be placed on the skin over the port site. Your health care provider will put on a mask and sterile gloves. The skin over your port will be cleaned carefully with a germ-killing soap and allowed to dry. Your health care provider will gently pinch the port and insert a needle into it. Your health care provider will check for a blood return to make sure the port is in the vein and is still working (patent). If your port needs to remain accessed to get medicine continuously (constant infusion), your health care provider will place a clear bandage (dressing) over the needle site. The dressing and needle will need to be changed every week, or as told by your health care provider. What is flushing? Flushing helps keep the port working. Follow instructions from your health care provider about how and when to flush the port. Ports are usually flushed with saline solution or a medicine called heparin. The need for flushing will depend on how the port is used: If the port is only used from time to time to give medicines or draw blood, the port may need to be flushed: Before and after medicines have been given. Before and after blood has been drawn. As part of routine maintenance. Flushing may be recommended every 4-6 weeks.  If a constant infusion is running, the port may not need to be flushed. Throw away any syringes in a disposal container that is meant for sharp items (sharps container). You can buy a sharps container from a pharmacy, or you can make one by using an empty hard plastic bottle with a cover. How long will my port stay implanted? The port can stay in for as long as your health care provider thinks it is needed. When it is time for the port to come out, a surgery will be done to remove it. The surgery will be similar  to the procedure that was done to put the port in. Follow these instructions at home: Caring for your port and port site Flush your port as told by your health care provider. If you need an infusion over several days, follow instructions from your health care provider about how to take care of your port site. Make sure you: Change your dressing as told by your health care provider. Wash your hands with soap and water for at least 20 seconds before and after you change your dressing. If soap and water are not available, use alcohol-based hand sanitizer. Place any used dressings or infusion bags into a plastic bag. Throw that bag in the trash. Keep the dressing that covers the needle clean and dry. Do not get it wet. Do not use scissors or sharp objects near the infusion tubing. Keep any external tubes clamped, unless they are being used. Check your port site every day for signs of infection. Check for: Redness, swelling, or pain. Fluid or blood. Warmth. Pus or a bad smell. Protect the skin around the port site. Avoid wearing bra straps that rub or irritate the site. Protect the skin around your port from seat belts. Place a soft pad over your chest if needed. Bathe or shower as told by your health care provider. The site may get wet as long as you are not actively receiving an infusion. General instructions  Return to your normal activities as told by your health care provider. Ask your health care provider what activities are safe for you. Carry a medical alert card or wear a medical alert bracelet at all times. This will let health care providers know that you have an implanted port in case of an emergency. Where to find more information American Cancer Society: www.cancer.org American Society of Clinical Oncology: www.cancer.net Contact a health care provider if: You have a fever or chills. You have redness, swelling, or pain at the port site. You have fluid or blood coming from your  port site. Your incision feels warm to the touch. You have pus or a bad smell coming from the port site. Summary Implanted ports are usually placed in the chest for long-term IV access. Follow instructions from your health care provider about flushing the port and changing bandages (dressings). Take care of the area around your port by avoiding clothing that puts pressure on the area, and by watching for signs of infection. Protect the skin around your port from seat belts. Place a soft pad over your chest if needed. Contact a health care provider if you have a fever or you have redness, swelling, pain, fluid, or a bad smell at the port site. This information is not intended to replace advice given to you by your health care provider. Make sure you discuss any questions you have with your health care provider. Document Revised: 04/13/2021 Document Reviewed: 04/13/2021 Elsevier Patient Education  2024  [employer].

## 2023-11-07 ENCOUNTER — Encounter: Payer: Self-pay | Admitting: Hematology & Oncology

## 2023-11-07 ENCOUNTER — Other Ambulatory Visit: Payer: Self-pay | Admitting: Adult Health

## 2023-11-07 ENCOUNTER — Other Ambulatory Visit: Payer: Self-pay | Admitting: Hematology & Oncology

## 2023-11-07 ENCOUNTER — Encounter: Payer: Self-pay | Admitting: *Deleted

## 2023-11-07 DIAGNOSIS — F5105 Insomnia due to other mental disorder: Secondary | ICD-10-CM

## 2023-11-07 LAB — IRON AND IRON BINDING CAPACITY (CC-WL,HP ONLY)
Iron: 95 ug/dL (ref 28–170)
Saturation Ratios: 24 % (ref 10.4–31.8)
TIBC: 400 ug/dL (ref 250–450)
UIBC: 305 ug/dL (ref 148–442)

## 2023-11-07 MED ORDER — SULFAMETHOXAZOLE-TRIMETHOPRIM 800-160 MG PO TABS: 1.0000 | ORAL_TABLET | Freq: Two times a day (BID) | ORAL | 0 refills | Status: DC

## 2023-11-08 ENCOUNTER — Other Ambulatory Visit: Payer: Self-pay | Admitting: Behavioral Health

## 2023-11-08 DIAGNOSIS — F331 Major depressive disorder, recurrent, moderate: Secondary | ICD-10-CM

## 2023-11-08 DIAGNOSIS — F411 Generalized anxiety disorder: Secondary | ICD-10-CM

## 2023-11-08 LAB — URINE CULTURE: Culture: 70000 — AB

## 2023-11-08 LAB — CHROMOGRANIN A: Chromogranin A (ng/mL): 64 ng/mL (ref 0.0–101.8)

## 2023-11-09 ENCOUNTER — Ambulatory Visit: Payer: Medicare Other | Admitting: Family Medicine

## 2023-11-09 ENCOUNTER — Encounter: Payer: Self-pay | Admitting: Family Medicine

## 2023-11-09 VITALS — BP 130/78 | HR 85 | Temp 98.0°F | Ht 66.0 in | Wt 239.8 lb

## 2023-11-09 DIAGNOSIS — Z7984 Long term (current) use of oral hypoglycemic drugs: Secondary | ICD-10-CM

## 2023-11-09 DIAGNOSIS — E1165 Type 2 diabetes mellitus with hyperglycemia: Secondary | ICD-10-CM | POA: Diagnosis not present

## 2023-11-09 DIAGNOSIS — R052 Subacute cough: Secondary | ICD-10-CM | POA: Diagnosis not present

## 2023-11-09 DIAGNOSIS — J22 Unspecified acute lower respiratory infection: Secondary | ICD-10-CM

## 2023-11-09 LAB — POCT GLYCOSYLATED HEMOGLOBIN (HGB A1C): Hemoglobin A1C: 9.3 % — AB (ref 4.0–5.6)

## 2023-11-09 LAB — GLUCOSE, POCT (MANUAL RESULT ENTRY): POC Glucose: 193 mg/dL — AB (ref 70–99)

## 2023-11-09 NOTE — Patient Instructions (Addendum)
Stay on amaryl for now until I can look at med options with your medical history.  Keep a record of your blood sugars and bring them to the next office visit in 2 weeks. Fasting or 2 hour after meals (and any time you feel it may be low - let me know if that occurs).  Glad cough is better. I expect you to continue to improve.

## 2023-11-09 NOTE — Progress Notes (Signed)
Subjective:  Patient ID: Wendy Barber, female    DOB: [birth year]  Age: 63 y.o. MRN: [id number]  CC:  Chief Complaint  Patient presents with   Cough    Pt here for recheck on pertusis notes she is feeling much better almost normal, notes still having some SOB lingering    HPI Wendy Barber presents for   Follow-up of subacute cough Last visit December 23.  Cough for approximately 9 days.  Had been seen by other provider with [facility] December 17.  COVID and flu testing negative, CT chest ordered that was negative for PE and no acute abnormality.  Bibasilar atelectasis but no focal consolidation pleural effusion or pneumothorax.  ER visit on December 17 as well with thought of possible upper respiratory infection and treated with Jerilynn Som and PCP follow-up.  On my exam and history suspected atypical infection, possible pertussis given her symptoms.  Also some breakthrough heartburn which may have been contributing to cough.  She was started on azithromycin, Mucinex or Mucinex DM, deferred Tessalon given side effects experienced.  Famotidine added for improved heartburn coverage and continued on pantoprazole.  Tussionex given if needed for cough relief at night.  Cough has improved - minimal past 2 weeks. No side effects with azithromycin. No dyspnea at rest. Only feeling of short of breath after some activity and then sitting. Some of these symptoms prior to cough, and has been improving.    Diabetes/hyperglycemia: Of note she has also been started on meds from her oncologist for diabetes.  Note reviewed from January 13, blood sugar 289, possibly due to her medications.  Started on glimepiride 4 mg daily at 1/13 visit. 1st dose this morning.  Infrequent home readings - 203 Some nausea. Urinary frequency, Recently started on abx for UTI and meds for yeast infection form Dr. Myna Hidalgo few days ago. Recent cataract surgery.  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 No hx of pancreatitis, or medullary thyroid CA, or MEN syndrome in family. On med for neuroendocine carcinoma.  History of GI/diarrheal issues - has been recommended to avoid metformin.    Lab Results  Component Value Date   NA 139 11/06/2023   K 3.8 11/06/2023   CO2 29 11/06/2023   GLUCOSE 289 (H) 11/06/2023   BUN 20 11/06/2023   CREATININE 1.07 (H) 11/06/2023   CALCIUM 9.3 11/06/2023   GFR 56.62 (L) 12/06/2022   EGFR 74 04/24/2023   GFRNONAA 59 (L) 11/06/2023   Lab Results  Component Value Date   HGBA1C 6.4 (H) 12/07/2022    History Patient Active Problem List   Diagnosis Date Noted   Hyperprolactinemia (HCC) 06/23/2023   Pituitary macroadenoma (HCC) 06/23/2023   Pituitary adenoma (HCC) 04/17/2023   Pulmonary emboli (HCC) 12/08/2022   Pulmonary embolism (HCC) 12/07/2022   Thyroid nodule 12/07/2022   Enteritis 07/23/2022   Fall at home, initial encounter 07/23/2022   AMS (altered mental status) 07/23/2022   Hemorrhoids 06/25/2022   Occasional tremors: Essential tremors 06/23/2022   Hypomagnesemia 06/22/2022   Iron deficiency anemia    Gastritis 06/18/2022   Depression 06/18/2022   UTI (urinary tract infection) 06/16/2022   DVT, bilateral lower limbs (HCC) 06/14/2022   Symptomatic bradycardia 06/13/2022   History of excision of intestinal structure 06/13/2022   CAD (coronary artery disease) 06/13/2022   Acute lower GI bleeding 05/04/2022   AKI (acute kidney injury) (HCC) 05/04/2022   History of deep vein thrombosis (DVT) of  lower extremity 05/04/2022   History of pulmonary embolus (PE) 05/04/2022   Anxiety 05/04/2022   Pernicious anemia 05/14/2021   Iron deficiency anemia due to chronic blood loss 05/14/2021   Abdominal wall abscess    Surgical wound infection 01/16/2021   C. 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   Goals of care, counseling/discussion 03/19/2020   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   DVT of deep femoral vein, left (HCC) 03/19/2020   Pulmonary embolism, bilateral (HCC) 03/19/2020   Presence of IVC filter 03/19/2020   Hepatic encephalopathy (HCC) 11/22/2019   Confusion 11/21/2019   Autoimmune hepatitis (HCC) 11/21/2019   Uncontrolled hypertension 11/21/2019   DM2 (diabetes mellitus, type 2) (HCC) 11/21/2019   Closed right ankle fracture 08/27/2019   Acute deep vein thrombosis (DVT) of femoral vein of left lower extremity (HCC) 08/03/2018   History of colon cancer 08/03/2018   History of partial colectomy 08/03/2018   Spinal stenosis 08/03/2018   Morbid obesity (HCC) 08/03/2018   Cerebral meningioma (HCC) 01/05/2018   Arthritis of right shoulder region 02/18/2017   History of renal calculi 02/14/2016   Midline low back pain 02/14/2016   Class 3 obesity 01/07/2016   Migraine with aura and without status migrainosus, not intractable 01/07/2016   Chronic fatigue syndrome 09/23/2013   GERD (gastroesophageal reflux disease) 09/23/2013   History of hepatitis A 09/23/2013   Rheumatoid arthritis involving multiple sites (HCC) 09/23/2013   Past Medical History:  Diagnosis Date   Arthritis    Back pain    Cancer (HCC)    pseudomyxoma peritonei   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   Chronic fatigue syndrome    Diabetes mellitus without complication (HCC)    DVT of deep femoral vein, left (HCC) 03/19/2020   Fibromyalgia    Goals of care, counseling/discussion 03/19/2020   Hypertension    Iron deficiency anemia due to chronic blood loss 05/14/2021   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   Pernicious anemia 05/14/2021   Presence of IVC filter 03/19/2020   Pulmonary embolism, bilateral (HCC) 03/19/2020   Short bowel syndrome, unspecified    Past Surgical History:  Procedure Laterality  Date   ABDOMINAL HYSTERECTOMY     ABDOMINAL SURGERY     ACHILLES TENDON REPAIR     APPENDECTOMY     ARTHROPLASTY     CARPAL TUNNEL RELEASE     CHOLECYSTECTOMY     IR CATHETER TUBE CHANGE  02/02/2021   IR CATHETER TUBE CHANGE  02/25/2021   IR IMAGING GUIDED PORT INSERTION  06/20/2022   IR RADIOLOGIST EVAL & MGMT  02/24/2021   IR RADIOLOGIST EVAL & MGMT  03/10/2021   IR RADIOLOGIST EVAL & MGMT  06/22/2022   IR THROMBECT VENO MECH MOD SED  06/20/2022   IR US GUIDE BX ASP/DRAIN  11/25/2019   IR US GUIDE VASC ACCESS LEFT  06/20/2022   IR US GUIDE VASC ACCESS RIGHT  06/20/2022   IR US GUIDE VASC ACCESS RIGHT  06/20/2022   IR VENO/EXT/BI  06/20/2022   IR VENOCAVAGRAM IVC  06/20/2022   JOINT REPLACEMENT     KNEE ARTHROSCOPY     ORIF ANKLE FRACTURE Right 08/27/2019   Procedure: OPEN REDUCTION INTERNAL FIXATION RIGHT ANKLE FRACTURE;  Surgeon: Sheral Apley, MD;  Location: [facility];  Service: Orthopedics;  Laterality: Right;   perineorrophy  TONGUE BIOPSY     TOTAL ABDOMINAL HYSTERECTOMY Bilateral 2004   Allergies  Allergen Reactions   Ativan [Lorazepam] Swelling and Other (See Comments)    Face & Throat Swelling  Note: tolerates midazolam fine   Corticosteroids Other (See Comments)    "Psychotic behavior"    Penicillins Shortness Of Breath and Other (See Comments)    Irregular and rapid Heart Rate, too    Alprazolam Hives and Other (See Comments)    Hard to arouse, unresponsiveness also   Erythromycin Nausea And Vomiting        Gabapentin Other (See Comments)    Made the patient feel depressed   Prednisolone Anxiety   Prednisone Anxiety and Other (See Comments)    "Anxiety & Nervous Breakdown"   Savella  [Milnacipran] Other (See Comments)    Reaction not noted   Prior to Admission medications   Medication Sig Start Date End Date Taking? Authorizing Provider  amLODipine (NORVASC) 5 MG tablet TAKE 1 TABLET(5 MG) BY MOUTH DAILY 04/24/23  Yes Shade Flood, MD   busPIRone (BUSPAR) 15 MG tablet Take 1 tablet (15 mg total) by mouth 3 (three) times daily. 07/11/23  Yes [name], NP  cabergoline (DOSTINEX) 0.5 MG tablet Take 0.5 tablets (0.25 mg total) by mouth 2 (two) times a week. 06/26/23  Yes [name], [name], MD  citalopram (CELEXA) 40 MG tablet Take 1 tablet (40 mg total) by mouth daily. 07/11/23  Yes [name], NP  cyanocobalamin 1000 MCG tablet Take 1 tablet (1,000 mcg total) by mouth daily. 07/26/22  Yes Lonia Blood, MD  Dextromethorphan-buPROPion ER (AUVELITY) 45-105 MG TBCR Take one tablet by mouth for 3 days, then take two tablet daily 7-8 hours between doses 03/07/23  Yes White, Brian A, NP  diazepam (VALIUM) 5 MG tablet Take 1 tab 30 minutes prior to enclosed imaging study. 06/01/23  Yes Shade Flood, MD  dicyclomine (BENTYL) 20 MG tablet TAKE 1 TABLET(20 MG) BY MOUTH THREE TIMES DAILY BEFORE MEALS 07/26/23  Yes [name], MD  diphenoxylate-atropine (LOMOTIL) 2.5-0.025 MG tablet TAKE 2 TABLETS BY MOUTH FOUR TIMES DAILY AS NEEDED FOR LOOSE STOOLS OR DIARRHEA 10/02/23  Yes Ennever, Rose Phi, MD  eszopiclone (LUNESTA) 2 MG TABS tablet TAKE 1 TABLET(2 MG) BY MOUTH AT BEDTIME AS NEEDED FOR SLEEP 11/08/23  Yes [name], NP  famotidine (PEPCID) 20 MG tablet Take 1 tablet (20 mg total) by mouth 2 (two) times daily. 10/16/23  Yes Shade Flood, MD  fluconazole (DIFLUCAN) 100 MG tablet Take 1 tablet (100 mg total) by mouth daily. 11/06/23  Yes [name], MD  fondaparinux (ARIXTRA) 10 MG/0.8ML SOLN injection Inject 0.8 mLs (10 mg total) into the skin daily. 03/15/23  Yes [name], MD  glimepiride (AMARYL) 4 MG tablet Take 1 tablet (4 mg total) by mouth daily with breakfast. 11/06/23  Yes Ennever, Rose Phi, MD  hydrocortisone (ANUSOL-HC) 2.5 % rectal cream PLACE RECTALLY 4 TIMES DAILY AS NEEDED FOR HEMORRHOIDS OR ANAL ITCHING 10/02/23  Yes Ennever, Rose Phi, MD  HYDROmorphone (DILAUDID) 4 MG tablet Take 1 tablet  (4 mg total) by mouth every 6 (six) hours as needed for severe pain (pain score 7-10). 09/05/23  Yes [name], MD  loperamide (IMODIUM) 2 MG capsule Take 2 capsules (4 mg total) by mouth 4 (four) times daily as needed for diarrhea or loose stools. 05/08/22  Yes Glade Lloyd, MD  meclizine (ANTIVERT) 25 MG tablet  TAKE 1 TABLET(25 MG) BY MOUTH THREE TIMES DAILY AS NEEDED FOR DIZZINESS 10/02/23  Yes Ennever, Rose Phi, MD  nystatin-triamcinolone ointment [facility]) Apply 1 Application topically 2 (two) times daily. For up to 7 days and then STOP.  Repeat as needed for flares after at least a 2 week break 01/30/23  Yes [name], DO  ondansetron (ZOFRAN) 8 MG tablet Take 1 tablet (8 mg total) by mouth every 8 (eight) hours as needed for nausea or vomiting. 08/09/23  Yes [name], MD  Opium 10 MG/ML (1%) TINC Take 0.6 mLs (6 mg total) by mouth every 3 (three) hours as needed for diarrhea or loose stools. 03/15/23  Yes [name], MD  orphenadrine (NORFLEX) 100 MG tablet TAKE 1 TABLET(100 MG) BY MOUTH AT BEDTIME AS NEEDED FOR MUSCLE SPASMS 11/07/23  Yes [name], MD  pantoprazole (PROTONIX) 40 MG tablet Take 1 tablet (40 mg total) by mouth daily before breakfast. 04/28/23  Yes Shade Flood, MD  potassium chloride (KLOR-CON M) 10 MEQ tablet Take 2 tablets (20 mEq total) by mouth daily. 07/07/22  Yes Shade Flood, MD  pramoxine-hydrocortisone (PROCTOCREAM-HC) 1-1 % rectal cream APPLY RECTALLY TO THE AFFECTED AREA TWICE DAILY 10/24/23  Yes Ennever, Rose Phi, MD  promethazine (PHENERGAN) 12.5 MG tablet Take 1 tablet (12.5 mg total) by mouth every 6 (six) hours as needed for nausea or vomiting. 09/05/23  Yes [name], MD  propranolol (INDERAL) 20 MG tablet TAKE 1 TABLET(20 MG) BY MOUTH TWICE DAILY 10/06/23  Yes Shade Flood, MD  sulfamethoxazole-trimethoprim (BACTRIM DS) 800-160 MG tablet Take 1 tablet by mouth 2 (two) times daily. 11/07/23  Yes Ennever, Rose Phi, MD   benzonatate (TESSALON) 100 MG capsule Take 1 capsule (100 mg total) by mouth every 8 (eight) hours. Patient not taking: Reported on 11/09/2023 10/10/23   Dolphus Jenny, PA-C  buPROPion (WELLBUTRIN XL) 300 MG 24 hr tablet TAKE 1 TABLET(300 MG) BY MOUTH DAILY 11/09/23   [name], NP  ferrous sulfate 325 (65 FE) MG tablet Take 1 tablet (325 mg total) by mouth daily. 06/03/22 06/03/23  Maurilio Lovely D, DO   Social History   Socioeconomic History   Marital status: Married    Spouse name: Not on file   Number of children: 3   Years of education: Not on file   Highest education level: Some college, no degree  Occupational History   Occupation: disability  Tobacco Use   Smoking status: Never   Smokeless tobacco: Never  Vaping Use   Vaping status: Never Used  Substance and Sexual Activity   Alcohol use: No   Drug use: Never   Sexual activity: Yes    Birth control/protection: None    Comment: hysterectomy  Other Topics Concern   Not on file  Social History Narrative   Right handed   One story home   Drinks occasional caffeine   Social Drivers of Health   Financial Resource Strain: Patient Declined (10/16/2023)   Overall Financial Resource Strain (CARDIA)    Difficulty of Paying Living Expenses: Patient declined  Food Insecurity: No Food Insecurity (10/16/2023)   Hunger Vital Sign    Worried About Running Out of Food in the Last Year: Never true    Ran Out of Food in the Last Year: Never true  Transportation Needs: No Transportation Needs (10/16/2023)   PRAPARE - Transportation    Lack of Transportation (Medical): No    Lack of  Transportation (Non-Medical): No  Physical Activity: Inactive (10/16/2023)   Exercise Vital Sign    Days of Exercise per Week: 0 days    Minutes of Exercise per Session: 20 min  Stress: Stress Concern Present (10/16/2023)   [employer] of Occupational Health - Occupational Stress Questionnaire    Feeling of Stress : Very much  Social  Connections: Socially Integrated (10/16/2023)   Social Connection and Isolation Panel [NHANES]    Frequency of Communication with Friends and Family: More than three times a week    Frequency of Social Gatherings with Friends and Family: Once a week    Attends Religious Services: More than 4 times per year    Active Member of [employer] or Organizations: No    Attends Engineer, structural: More than 4 times per year    Marital Status: Married  Catering manager Violence: Not At Risk (03/09/2023)   Humiliation, Afraid, Rape, and Kick questionnaire    Fear of Current or Ex-Partner: No    Emotionally Abused: No    Physically Abused: No    Sexually Abused: No    Review of Systems   Objective:   Vitals:   11/09/23 1131  BP: 130/78  Pulse: 85  Temp: 98 F (36.7 C)  TempSrc: Temporal  SpO2: 96%  Weight: 239 lb 12.8 oz (108.8 kg)  Height: 5\' 6"  (1.676 m)     Physical Exam Vitals reviewed.  Constitutional:      General: She is not in acute distress.    Appearance: Normal appearance. She is well-developed. She is not ill-appearing, toxic-appearing or diaphoretic.  HENT:     Head: Normocephalic and atraumatic.  Eyes:     Conjunctiva/sclera: Conjunctivae normal.     Pupils: Pupils are equal, round, and reactive to light.  Neck:     Vascular: No carotid bruit.  Cardiovascular:     Rate and Rhythm: Normal rate and regular rhythm.     Heart sounds: Normal heart sounds.  Pulmonary:     Effort: Pulmonary effort is normal.     Breath sounds: Normal breath sounds.  Abdominal:     Palpations: Abdomen is soft. There is no pulsatile mass.     Tenderness: There is no abdominal tenderness.  Musculoskeletal:     Right lower leg: No edema.     Left lower leg: No edema.  Skin:    General: Skin is warm and dry.  Neurological:     Mental Status: She is alert and oriented to person, place, and time.  Psychiatric:        Mood and Affect: Mood normal.        Behavior: Behavior  normal.        Assessment & Plan:  Wendy Barber is a 63 y.o. female . Type 2 diabetes mellitus with hyperglycemia, without long-term current use of insulin (HCC) - Plan: POCT glucose (manual entry), POCT glycosylated hemoglobin (Hb A1C)  LRTI (lower respiratory tract infection)  Subacute cough  Lower respiratory tract infection improved.  Cough improved.  Suspected pertussis versus other atypical.  RTC precautions if not continuing to improve.  Worsening diabetic control, possibly related to other medications.  Recently started on glimepiride.  Will need to look at safety of other medications given her medical history and gastrointestinal history as I am not sure GLP/GIP would be a relatively safe option.  Would avoid metformin given her gastrointestinal issues.  Could consider SGLT2 but would be cautious with any dehydration or prior UTIs.  Close follow-up planned in 2 weeks with home readings and will discuss further at that time.  RTC precautions given in the interim.  No orders of the defined types were placed in this encounter.  Patient Instructions  Stay on amaryl for now until I can look at med options with your medical history.  Keep a record of your blood sugars and bring them to the next office visit in 2 weeks. Fasting or 2 hour after meals (and any time you feel it may be low - let me know if that occurs).  Glad cough is better. I expect you to continue to improve.       Signed,   Meredith Staggers, MD [facility], [facility] 11/09/23 11:59 AM

## 2023-11-12 ENCOUNTER — Other Ambulatory Visit: Payer: Self-pay | Admitting: Hematology & Oncology

## 2023-11-12 DIAGNOSIS — H8112 Benign paroxysmal vertigo, left ear: Secondary | ICD-10-CM

## 2023-11-13 ENCOUNTER — Encounter: Payer: Self-pay | Admitting: Hematology & Oncology

## 2023-11-14 ENCOUNTER — Encounter: Payer: Self-pay | Admitting: Family Medicine

## 2023-11-22 ENCOUNTER — Other Ambulatory Visit: Payer: Self-pay | Admitting: Family Medicine

## 2023-11-22 DIAGNOSIS — I10 Essential (primary) hypertension: Secondary | ICD-10-CM

## 2023-11-28 ENCOUNTER — Other Ambulatory Visit: Payer: Self-pay | Admitting: Family Medicine

## 2023-11-28 ENCOUNTER — Other Ambulatory Visit: Payer: Self-pay

## 2023-11-28 DIAGNOSIS — R12 Heartburn: Secondary | ICD-10-CM

## 2023-12-03 ENCOUNTER — Other Ambulatory Visit: Payer: Self-pay | Admitting: Family Medicine

## 2023-12-04 ENCOUNTER — Inpatient Hospital Stay (HOSPITAL_BASED_OUTPATIENT_CLINIC_OR_DEPARTMENT_OTHER): Payer: Medicare Other | Admitting: Hematology & Oncology

## 2023-12-04 ENCOUNTER — Inpatient Hospital Stay: Payer: Medicare Other | Attending: Hematology & Oncology

## 2023-12-04 ENCOUNTER — Inpatient Hospital Stay: Payer: Medicare Other

## 2023-12-04 ENCOUNTER — Encounter: Payer: Self-pay | Admitting: Hematology & Oncology

## 2023-12-04 VITALS — BP 153/93 | HR 61 | Temp 98.1°F | Resp 20 | Wt 244.0 lb

## 2023-12-04 DIAGNOSIS — D510 Vitamin B12 deficiency anemia due to intrinsic factor deficiency: Secondary | ICD-10-CM | POA: Insufficient documentation

## 2023-12-04 DIAGNOSIS — Z7901 Long term (current) use of anticoagulants: Secondary | ICD-10-CM | POA: Insufficient documentation

## 2023-12-04 DIAGNOSIS — Z888 Allergy status to other drugs, medicaments and biological substances status: Secondary | ICD-10-CM | POA: Insufficient documentation

## 2023-12-04 DIAGNOSIS — Z880 Allergy status to penicillin: Secondary | ICD-10-CM | POA: Diagnosis not present

## 2023-12-04 DIAGNOSIS — R8271 Bacteriuria: Secondary | ICD-10-CM

## 2023-12-04 DIAGNOSIS — C181 Malignant neoplasm of appendix: Secondary | ICD-10-CM

## 2023-12-04 DIAGNOSIS — R300 Dysuria: Secondary | ICD-10-CM | POA: Diagnosis not present

## 2023-12-04 DIAGNOSIS — R5383 Other fatigue: Secondary | ICD-10-CM | POA: Diagnosis not present

## 2023-12-04 DIAGNOSIS — Z86718 Personal history of other venous thrombosis and embolism: Secondary | ICD-10-CM | POA: Insufficient documentation

## 2023-12-04 DIAGNOSIS — N3000 Acute cystitis without hematuria: Secondary | ICD-10-CM | POA: Diagnosis not present

## 2023-12-04 DIAGNOSIS — Z881 Allergy status to other antibiotic agents status: Secondary | ICD-10-CM | POA: Diagnosis not present

## 2023-12-04 DIAGNOSIS — M549 Dorsalgia, unspecified: Secondary | ICD-10-CM | POA: Diagnosis not present

## 2023-12-04 DIAGNOSIS — R109 Unspecified abdominal pain: Secondary | ICD-10-CM | POA: Diagnosis not present

## 2023-12-04 DIAGNOSIS — E1165 Type 2 diabetes mellitus with hyperglycemia: Secondary | ICD-10-CM

## 2023-12-04 DIAGNOSIS — Z87440 Personal history of urinary (tract) infections: Secondary | ICD-10-CM | POA: Insufficient documentation

## 2023-12-04 DIAGNOSIS — R799 Abnormal finding of blood chemistry, unspecified: Secondary | ICD-10-CM | POA: Diagnosis not present

## 2023-12-04 DIAGNOSIS — Z86711 Personal history of pulmonary embolism: Secondary | ICD-10-CM | POA: Diagnosis not present

## 2023-12-04 DIAGNOSIS — Z79899 Other long term (current) drug therapy: Secondary | ICD-10-CM | POA: Diagnosis not present

## 2023-12-04 DIAGNOSIS — K529 Noninfective gastroenteritis and colitis, unspecified: Secondary | ICD-10-CM | POA: Insufficient documentation

## 2023-12-04 DIAGNOSIS — C786 Secondary malignant neoplasm of retroperitoneum and peritoneum: Secondary | ICD-10-CM

## 2023-12-04 LAB — CBC WITH DIFFERENTIAL (CANCER CENTER ONLY)
Abs Immature Granulocytes: 0.04 10*3/uL (ref 0.00–0.07)
Basophils Absolute: 0.1 10*3/uL (ref 0.0–0.1)
Basophils Relative: 1 %
Eosinophils Absolute: 0.3 10*3/uL (ref 0.0–0.5)
Eosinophils Relative: 3 %
HCT: 38.7 % (ref 36.0–46.0)
Hemoglobin: 12.5 g/dL (ref 12.0–15.0)
Immature Granulocytes: 1 %
Lymphocytes Relative: 48 %
Lymphs Abs: 3.5 10*3/uL (ref 0.7–4.0)
MCH: 31.1 pg (ref 26.0–34.0)
MCHC: 32.3 g/dL (ref 30.0–36.0)
MCV: 96.3 fL (ref 80.0–100.0)
Monocytes Absolute: 0.5 10*3/uL (ref 0.1–1.0)
Monocytes Relative: 7 %
Neutro Abs: 2.9 10*3/uL (ref 1.7–7.7)
Neutrophils Relative %: 40 %
Platelet Count: 232 10*3/uL (ref 150–400)
RBC: 4.02 MIL/uL (ref 3.87–5.11)
RDW: 13.4 % (ref 11.5–15.5)
WBC Count: 7.3 10*3/uL (ref 4.0–10.5)
nRBC: 0 % (ref 0.0–0.2)

## 2023-12-04 LAB — CMP (CANCER CENTER ONLY)
ALT: 40 U/L (ref 0–44)
AST: 54 U/L — ABNORMAL HIGH (ref 15–41)
Albumin: 3.7 g/dL (ref 3.5–5.0)
Alkaline Phosphatase: 168 U/L — ABNORMAL HIGH (ref 38–126)
Anion gap: 7 (ref 5–15)
BUN: 16 mg/dL (ref 8–23)
CO2: 30 mmol/L (ref 22–32)
Calcium: 9 mg/dL (ref 8.9–10.3)
Chloride: 105 mmol/L (ref 98–111)
Creatinine: 1.02 mg/dL — ABNORMAL HIGH (ref 0.44–1.00)
GFR, Estimated: >60 mL/min (ref >60)
Glucose, Bld: 144 mg/dL — ABNORMAL HIGH (ref 70–99)
Potassium: 3.6 mmol/L (ref 3.5–5.1)
Sodium: 142 mmol/L (ref 135–145)
Total Bilirubin: 0.4 mg/dL (ref 0.0–1.2)
Total Protein: 6.9 g/dL (ref 6.5–8.1)

## 2023-12-04 LAB — HEMOGLOBIN A1C
Hgb A1c MFr Bld: 8.9 % — ABNORMAL HIGH (ref 4.8–5.6)
Mean Plasma Glucose: 208.73 mg/dL

## 2023-12-04 LAB — URINALYSIS, COMPLETE (UACMP) WITH MICROSCOPIC
Glucose, UA: 100 mg/dL — AB
Ketones, ur: NEGATIVE mg/dL
Leukocytes,Ua: NEGATIVE
Nitrite: POSITIVE — AB
Protein, ur: 30 mg/dL — AB
Specific Gravity, Urine: >=1.03 (ref 1.005–1.030)
pH: 5.5 (ref 5.0–8.0)

## 2023-12-04 LAB — RETICULOCYTES
Immature Retic Fract: 9.7 % (ref 2.3–15.9)
RBC.: 4.02 MIL/uL (ref 3.87–5.11)
Retic Count, Absolute: 78.4 10*3/uL (ref 19.0–186.0)
Retic Ct Pct: 2 % (ref 0.4–3.1)

## 2023-12-04 LAB — FERRITIN: Ferritin: 64 ng/mL (ref 11–307)

## 2023-12-04 MED ORDER — HEPARIN SOD (PORK) LOCK FLUSH 100 UNIT/ML IV SOLN
500.0000 [IU] | Freq: Once | INTRAVENOUS | Status: AC
Start: 2023-12-04 — End: 2023-12-04
  Administered 2023-12-04: 500 [IU] via INTRAVENOUS

## 2023-12-04 MED ORDER — SODIUM CHLORIDE 0.9% FLUSH
10.0000 mL | INTRAVENOUS | Status: DC | PRN
Start: 2023-12-04 — End: 2023-12-04
  Administered 2023-12-04: 10 mL via INTRAVENOUS

## 2023-12-04 MED ORDER — LANREOTIDE ACETATE 120 MG/0.5ML ~~LOC~~ SOLN
120.0000 mg | Freq: Once | SUBCUTANEOUS | Status: AC
  Administered 2023-12-04: 120 mg via SUBCUTANEOUS
  Filled 2023-12-04: qty 120

## 2023-12-04 NOTE — Progress Notes (Signed)
 Hematology and Oncology Follow Up Visit  Wendy Barber [id number] 1961/01/27 63 y.o. 12/04/2023   Principle Diagnosis:  History of metastatic appendiceal cancer  -- recurrent  Superior mesenteric vein thrombus Thromboembolism of the RIGHT leg Pernicious anemia Acute pulmonary embolism-segmental right pulmonary artery Carcinoid/neuroendocrine carcinoma  Current Therapy:   HIPEC - Surgery done in [place] in 11/2020 Arixtra  10 mg subcu daily --start on 2/16 2024 Vitamin B12 5000 mcg PO daily  Somatuline 120 mg IM monthly-start on 07/12/2023     Interim History:  Wendy Barber is in for follow-up.  She is still having some problems with her urine.  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  I am not sure exactly if this is truly the case.  Which she does not have a gynecologist.  I will see if Dr. Annabell Key of Gynecology can see her.  Maybe, she will be able to figure this out.  She did have a Klebsiella urinary tract infection while we last saw her.  We are going to repeat a urinalysis and culture.  She still has problems with diarrhea.  Again this is some that she will always have.  Blood sugars are a whole lot better.  We started her on some Amaryl  and this is helped her blood sugars.  Her last Chromogranin A was 64.  I think she still goes up to [facility] to see her surgeon I think in March.  She has had no problems with fever.  She does have some abdominal pain, more so down the pelvic area.  Again this might be from her urine.  She is still on the Arixtra .  I think she is doing well on the Arixtra .  Has been no chest pain.  She has had no cough.  She  says she drove for the first time in 2 years.  I am so happy that she is able to do this as this does give her a little bit of better quality of life.  Overall, I would have said that her performance status is probably ECOG 1.  .  Wt Readings from Last 3 Encounters:  12/04/23 244 lb (110.7 kg)  11/09/23 239 lb 12.8  oz (108.8 kg)  11/06/23 239 lb (108.4 kg)    Medications:  Current Outpatient Medications:    amLODipine  (NORVASC ) 5 MG tablet, TAKE 1 TABLET(5 MG) BY MOUTH DAILY, Disp: 90 tablet, Rfl: 1   busPIRone  (BUSPAR ) 15 MG tablet, Take 1 tablet (15 mg total) by mouth 3 (three) times daily., Disp: 270 tablet, Rfl: 0   cabergoline  (DOSTINEX ) 0.5 MG tablet, Take 0.5 tablets (0.25 mg total) by mouth 2 (two) times a week., Disp: 12 tablet, Rfl: 2   citalopram  (CELEXA ) 40 MG tablet, Take 1 tablet (40 mg total) by mouth daily., Disp: 90 tablet, Rfl: 1   dicyclomine  (BENTYL ) 20 MG tablet, TAKE 1 TABLET(20 MG) BY MOUTH THREE TIMES DAILY BEFORE MEALS, Disp: 90 tablet, Rfl: 4   diphenoxylate -atropine  (LOMOTIL ) 2.5-0.025 MG tablet, TAKE 2 TABLETS BY MOUTH FOUR TIMES DAILY AS NEEDED FOR LOOSE STOOLS OR DIARRHEA, Disp: 240 tablet, Rfl: 0   eszopiclone  (LUNESTA ) 2 MG TABS tablet, TAKE 1 TABLET(2 MG) BY MOUTH AT BEDTIME AS NEEDED FOR SLEEP, Disp: 30 tablet, Rfl: 0   famotidine  (PEPCID ) 20 MG tablet, TAKE 1 TABLET(20 MG) BY MOUTH TWICE DAILY, Disp: 60 tablet, Rfl: 0   fondaparinux  (ARIXTRA ) 10 MG/0.8ML SOLN injection, Inject 0.8 mLs (10 mg total) into the skin daily., Disp: 72 mL, Rfl:  3   glimepiride  (AMARYL ) 4 MG tablet, Take 1 tablet (4 mg total) by mouth daily with breakfast., Disp: 30 tablet, Rfl: 4   hydrocortisone  (ANUSOL -HC) 2.5 % rectal cream, PLACE RECTALLY 4 TIMES DAILY AS NEEDED FOR HEMORRHOIDS OR ANAL ITCHING, Disp: 30 g, Rfl: 1   HYDROmorphone  (DILAUDID ) 4 MG tablet, Take 1 tablet (4 mg total) by mouth every 6 (six) hours as needed for severe pain (pain score 7-10)., Disp: 60 tablet, Rfl: 0   loperamide  (IMODIUM ) 2 MG capsule, Take 2 capsules (4 mg total) by mouth 4 (four) times daily as needed for diarrhea or loose stools., Disp: , Rfl:    meclizine  (ANTIVERT ) 25 MG tablet, TAKE 1 TABLET(25 MG) BY MOUTH THREE TIMES DAILY AS NEEDED FOR DIZZINESS, Disp: 60 tablet, Rfl: 0   ondansetron  (ZOFRAN ) 8 MG tablet,  TAKE 1 TABLET(8 MG) BY MOUTH EVERY 8 HOURS AS NEEDED FOR NAUSEA OR VOMITING, Disp: 30 tablet, Rfl: 2   orphenadrine  (NORFLEX ) 100 MG tablet, TAKE 1 TABLET(100 MG) BY MOUTH AT BEDTIME AS NEEDED FOR MUSCLE SPASMS, Disp: 30 tablet, Rfl: 2   pantoprazole  (PROTONIX ) 40 MG tablet, TAKE 1 TABLET BY MOUTH EVERY DAY BEFORE BREAKFAST, Disp: 90 tablet, Rfl: 1   pramoxine-hydrocortisone  (PROCTOCREAM-HC) 1-1 % rectal cream, APPLY RECTALLY TO THE AFFECTED AREA TWICE DAILY, Disp: 30 g, Rfl: 3   promethazine  (PHENERGAN ) 12.5 MG tablet, Take 1 tablet (12.5 mg total) by mouth every 6 (six) hours as needed for nausea or vomiting., Disp: 90 tablet, Rfl: 3   propranolol  (INDERAL ) 20 MG tablet, TAKE 1 TABLET(20 MG) BY MOUTH TWICE DAILY, Disp: 180 tablet, Rfl: 1   benzonatate  (TESSALON ) 100 MG capsule, Take 1 capsule (100 mg total) by mouth every 8 (eight) hours. (Patient not taking: Reported on 12/04/2023), Disp: 21 capsule, Rfl: 0   buPROPion  (WELLBUTRIN  XL) 300 MG 24 hr tablet, TAKE 1 TABLET(300 MG) BY MOUTH DAILY (Patient not taking: Reported on 12/04/2023), Disp: 30 tablet, Rfl: 0   Dextromethorphan -buPROPion  ER (AUVELITY ) 45-105 MG TBCR, Take one tablet by mouth for 3 days, then take two tablet daily 7-8 hours between doses (Patient not taking: Reported on 12/04/2023), Disp: 60 tablet, Rfl: 1   diazepam  (VALIUM ) 5 MG tablet, Take 1 tab 30 minutes prior to enclosed imaging study. (Patient not taking: Reported on 12/04/2023), Disp: 4 tablet, Rfl: 1   nystatin -triamcinolone  ointment (MYCOLOG), Apply 1 Application topically 2 (two) times daily. For up to 7 days and then STOP.  Repeat as needed for flares after at least a 2 week break (Patient not taking: Reported on 12/04/2023), Disp: 30 g, Rfl: 0   Opium  10 MG/ML (1%) TINC, Take 0.6 mLs (6 mg total) by mouth every 3 (three) hours as needed for diarrhea or loose stools. (Patient not taking: Reported on 12/04/2023), Disp: 118 mL, Rfl: 0   potassium chloride  (KLOR-CON  M) 10 MEQ  tablet, Take 2 tablets (20 mEq total) by mouth daily. (Patient not taking: Reported on 12/04/2023), Disp: 60 tablet, Rfl: 1  Allergies:  Allergies  Allergen Reactions   Ativan [Lorazepam] Swelling and Other (See Comments)    Face & Throat Swelling  Note: tolerates midazolam  fine   Corticosteroids Other (See Comments)    "Psychotic behavior"    Penicillins Shortness Of Breath and Other (See Comments)    Irregular and rapid Heart Rate, too    Alprazolam Hives and Other (See Comments)    Hard to arouse, unresponsiveness also   Erythromycin Nausea And Vomiting  Gabapentin  Other (See Comments)    Made the patient feel depressed   Prednisolone Anxiety   Prednisone Anxiety and Other (See Comments)    "Anxiety & Nervous Breakdown"   Savella  [Milnacipran] Other (See Comments)    Reaction not noted    Past Medical History, Surgical history, Social history, and Family History were reviewed and updated.  Review of Systems: Review of Systems  Constitutional:  Positive for fatigue and unexpected weight change.  HENT:  Negative.    Eyes: Negative.   Respiratory: Negative.    Cardiovascular: Negative.   Gastrointestinal:  Positive for abdominal pain and diarrhea.  Endocrine: Negative.   Genitourinary:  Positive for dysuria.   Musculoskeletal:  Positive for back pain.  Skin: Negative.   Neurological: Negative.   Hematological: Negative.   Psychiatric/Behavioral:  Negative for depression.     Physical Exam: Vitals:   12/04/23 1500  BP: (!) 153/93  Pulse: 61  Resp: 20  Temp: 98.1 F (36.7 C)  SpO2: 96%   Wt Readings from Last 3 Encounters:  12/04/23 244 lb (110.7 kg)  11/09/23 239 lb 12.8 oz (108.8 kg)  11/06/23 239 lb (108.4 kg)    Physical Exam Vitals reviewed.  HENT:     Head: Normocephalic and atraumatic.  Eyes:     Pupils: Pupils are equal, round, and reactive to light.  Cardiovascular:     Rate and Rhythm: Normal rate and regular rhythm.     Heart  sounds: Normal heart sounds.  Pulmonary:     Effort: Pulmonary effort is normal.     Breath sounds: Normal breath sounds.  Abdominal:     General: Bowel sounds are normal.     Palpations: Abdomen is soft.  Musculoskeletal:        General: No tenderness or deformity. Normal range of motion.     Cervical back: Normal range of motion.  Lymphadenopathy:     Cervical: No cervical adenopathy.  Skin:    General: Skin is warm and dry.     Findings: No erythema or rash.  Neurological:     Mental Status: She is alert and oriented to person, place, and time.  Psychiatric:        Behavior: Behavior normal.        Thought Content: Thought content normal.        Judgment: Judgment normal.   Lab Results  Component Value Date   WBC 7.3 12/04/2023   HGB 12.5 12/04/2023   HCT 38.7 12/04/2023   MCV 96.3 12/04/2023   PLT 232 12/04/2023     Chemistry      Component Value Date/Time   NA 142 12/04/2023 1445   NA 137 04/24/2023 1437   K 3.6 12/04/2023 1445   CL 105 12/04/2023 1445   CO2 30 12/04/2023 1445   BUN 16 12/04/2023 1445   BUN 18 04/24/2023 1437   CREATININE 1.02 (H) 12/04/2023 1445      Component Value Date/Time   CALCIUM  9.0 12/04/2023 1445   ALKPHOS 168 (H) 12/04/2023 1445   AST 54 (H) 12/04/2023 1445   ALT 40 12/04/2023 1445   BILITOT 0.4 12/04/2023 1445      Impression and Plan: Ms. Chiou is a very charming 63 year-old white female. She has recurrent appendiceal cancer.  She underwent a HIPEC procedure in [place] a couple years ago. Unfortunately she had had chronic diarrhea since including C. Diff which has been successfully treatment.    I am not sure at all  if there is any kind of fistula.  Again, hopefully Dr. Annabell Key of Gynecology will be able to help us  out.  We will have to also check another urinalysis on her.  This will certainly help us  to.  She will get her Somatuline today.  I am not sure she will have the CAT scan done down here or up in [place].   We will be more than happy to do this CT scan if need be.  Again, I am just focusing on her quality of life.  We will plan to get her back here in another month.   Ivor Mars, MD 2/10/20253:26 PM

## 2023-12-04 NOTE — Progress Notes (Signed)
 No B12 today.  Jobe Mulder [place], [place], BCPS, BCOP 12/04/2023 4:09 PM

## 2023-12-04 NOTE — Patient Instructions (Signed)

## 2023-12-04 NOTE — Patient Instructions (Signed)
 Lanreotide Injection What is this medication? LANREOTIDE (lan REE oh tide) treats high levels of growth hormone (acromegaly). It is used when other therapies have not worked well enough or cannot be tolerated. It works by reducing the amount of growth hormone your body makes. This reduces symptoms and the risk of health problems caused by too much growth hormone, such as diabetes and heart disease. It may also be used to treat neuroendocrine tumors, a cancer of the cells that release hormones and other substances in your body. It works by slowing down the release of these substances from the cells. This slows tumor growth. It also decreases the symptoms of carcinoid syndrome, such as flushing or diarrhea. This medicine may be used for other purposes; ask your health care provider or pharmacist if you have questions. COMMON BRAND NAME(S): Somatuline [place] What should I tell my care team before I take this medication? They need to know if you have any of these conditions: Diabetes Gallbladder disease Heart disease Kidney disease Liver disease Pancreatic disease Thyroid disease An unusual or allergic reaction to lanreotide, other medications, foods, dyes, or preservatives Pregnant or trying to get pregnant Breastfeeding How should I use this medication? This medication is injected under the skin. It is given by your care team in a hospital or clinic setting. Talk to your care team about the use of this medication in children. Special care may be needed. Overdosage: If you think you have taken too much of this medicine contact a poison control center or emergency room at once. NOTE: This medicine is only for you. Do not share this medicine with others. What if I miss a dose? Keep appointments for follow-up doses. It is important not to miss your dose. Call your care team if you are unable to keep an appointment. What may interact with this medication? Bromocriptine Cyclosporine Certain  medications for blood pressure, heart disease, irregular heartbeat Certain medications for diabetes Quinidine Terfenadine This list may not describe all possible interactions. Give your health care provider a list of all the medicines, herbs, non-prescription drugs, or dietary supplements you use. Also tell them if you smoke, drink alcohol, or use illegal drugs. Some items may interact with your medicine. What should I watch for while using this medication? Visit your care team for regular checks on your progress. Tell your care team if your symptoms do not start to get better or if they get worse. Your condition will be monitored carefully while you are receiving this medication. You may need blood work while you are taking this medication. This medication may increase blood sugar. The risk may be higher in patients who already have diabetes. Ask your care team what you can do to lower your risk of diabetes while taking this medication. Talk to your care team if you wish to become pregnant or think you may be pregnant. This medication can cause serious birth defects. Do not breast-feed while taking this medication and for 6 months after stopping therapy. This medication may cause infertility. Talk to your care team if you are concerned about your fertility. What side effects may I notice from receiving this medication? Side effects that you should report to your care team as soon as possible: Allergic reactions--skin rash, itching, hives, swelling of the face, lips, tongue, or throat Gallbladder problems--severe stomach pain, nausea, vomiting, fever High blood sugar (hyperglycemia)--increased thirst or amount of urine, unusual weakness or fatigue, blurry vision Increase in blood pressure Low blood sugar (hypoglycemia)--pale, blue or purple  skin or lips, sweating, fussiness, rapid heartbeat, poor feeding, low body temperature Low thyroid levels (hypothyroidism)--unusual weakness or fatigue,  increased sensitivity to cold, constipation, hair loss, dry skin, weight gain, feelings of depression Oily or light-colored stools, diarrhea, bloating, weight loss Slow heartbeat--dizziness, feeling faint or lightheaded, confusion, trouble breathing, unusual weakness or fatigue Side effects that usually do not require medical attention (report these to your care team if they continue or are bothersome): Diarrhea Dizziness Headache Muscle spasms Nausea Pain, redness, or irritation at injection site Stomach pain This list may not describe all possible side effects. Call your doctor for medical advice about side effects. You may report side effects to FDA at 1-800-FDA-1088. Where should I keep my medication? This medication is given in a hospital or clinic. It will not be stored at home. NOTE: This sheet is a summary. It may not cover all possible information. If you have questions about this medicine, talk to your doctor, pharmacist, or health care provider.  2024 Elsevier/Gold Standard (2023-09-22 00:00:00)

## 2023-12-05 LAB — IRON AND IRON BINDING CAPACITY (CC-WL,HP ONLY)
Iron: 77 ug/dL (ref 28–170)
Saturation Ratios: 20 % (ref 10.4–31.8)
TIBC: 377 ug/dL (ref 250–450)
UIBC: 300 ug/dL (ref 148–442)

## 2023-12-06 LAB — CHROMOGRANIN A: Chromogranin A (ng/mL): 37.9 ng/mL (ref 0.0–101.8)

## 2023-12-08 ENCOUNTER — Other Ambulatory Visit: Payer: Self-pay | Admitting: Hematology & Oncology

## 2023-12-08 DIAGNOSIS — H8112 Benign paroxysmal vertigo, left ear: Secondary | ICD-10-CM

## 2023-12-08 DIAGNOSIS — I4510 Unspecified right bundle-branch block: Secondary | ICD-10-CM

## 2023-12-08 HISTORY — DX: Unspecified right bundle-branch block: I45.10

## 2023-12-09 ENCOUNTER — Other Ambulatory Visit: Payer: Self-pay | Admitting: Behavioral Health

## 2023-12-09 ENCOUNTER — Other Ambulatory Visit: Payer: Self-pay | Admitting: Hematology & Oncology

## 2023-12-09 DIAGNOSIS — F5105 Insomnia due to other mental disorder: Secondary | ICD-10-CM

## 2023-12-11 ENCOUNTER — Other Ambulatory Visit: Payer: Self-pay | Admitting: Hematology & Oncology

## 2023-12-11 DIAGNOSIS — C772 Secondary and unspecified malignant neoplasm of intra-abdominal lymph nodes: Secondary | ICD-10-CM

## 2023-12-11 MED ORDER — HYDROMORPHONE HCL 4 MG PO TABS
4.0000 mg | ORAL_TABLET | Freq: Four times a day (QID) | ORAL | 0 refills | Status: DC | PRN
Start: 2023-12-11 — End: 2024-02-08

## 2023-12-12 ENCOUNTER — Encounter (HOSPITAL_BASED_OUTPATIENT_CLINIC_OR_DEPARTMENT_OTHER): Payer: Self-pay | Admitting: Obstetrics & Gynecology

## 2023-12-12 ENCOUNTER — Encounter: Payer: Self-pay | Admitting: Hematology & Oncology

## 2023-12-12 ENCOUNTER — Ambulatory Visit (HOSPITAL_BASED_OUTPATIENT_CLINIC_OR_DEPARTMENT_OTHER): Payer: Medicare Other | Admitting: Obstetrics & Gynecology

## 2023-12-12 VITALS — BP 129/97 | HR 74 | Ht 66.0 in | Wt 238.4 lb

## 2023-12-12 DIAGNOSIS — Z95828 Presence of other vascular implants and grafts: Secondary | ICD-10-CM | POA: Diagnosis not present

## 2023-12-12 DIAGNOSIS — Z86718 Personal history of other venous thrombosis and embolism: Secondary | ICD-10-CM | POA: Diagnosis not present

## 2023-12-12 DIAGNOSIS — N390 Urinary tract infection, site not specified: Secondary | ICD-10-CM | POA: Diagnosis not present

## 2023-12-12 DIAGNOSIS — C786 Secondary malignant neoplasm of retroperitoneum and peritoneum: Secondary | ICD-10-CM

## 2023-12-12 DIAGNOSIS — Z90710 Acquired absence of both cervix and uterus: Secondary | ICD-10-CM

## 2023-12-12 NOTE — Progress Notes (Unsigned)
PROBLEM VISIT  Patient name: Wendy Barber MRN [id number]  Date of birth: 1961/09/13 Chief Complaint:   Air from vagina, fistula?  History of Present Illness:   Wendy Barber is a 63 y.o. G3P3 Caucasian female being seen today with concerns for possible fistula due to having a sensation of passing air from the vagina.  She does have a complicated abdominal surgery history.  Had a hysterectomy in 04/2003.  At that time there was abnormal appearing gelatinous material that was present at the time of surgery.  TAH/BSO was done as well as her appendix.  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  Then on 01/06/2004 she underwent cytoredutive surgery and HIPEC.  The cytoreductive surgery included resection of large pelvic tumor, omentectomy, resection of capsule of the right lobe of the liver, partial vaginectomy, portal dissection, bilteral parietal peritonectonmies, resection of a colonoc tumor x 2 with prmary closeu, resectoin of small bowel tumors followed by HIPEC with mitomycin-C for 90 minutes.    She did well for several years.  Then 03/2020, she started having back pain and underwent evaluation with CT scan showing a RLQ low-attenuating mass suspicious fo appendiceal cancer reucrrence.  Liver had cirrhotic appearance.  She had been diagnosed with auto-immune hepatitis in January, 2021.  Pelvic mass was biopsies and was positive for abdunant mucin.  On Dec 10, 2020, she underwent x lap cytoreductive surgery including resection of portion of small dowel, proimal jejunun, ascending colon and partial cystectomy followed by HIPEC with mitomycin-C.  She had a post op anastomotic leak repair on POD #10 and then had seconardy closure of the adominal wound on POD #13.  Pathology showed low grade mucinous neoplasm.    She is still followed at [facility] by Dr. Ladell Pier, surgical oncologist who has been involved in her care for years.  Her major issues,  currently, are diarrhea, multiple events a day.  She has tried multiple options: immodium, lomotil, tincture of opium.  Nothing has really helped.  It is so bad, she has considered a colostomy.  She knows this would be a very ahrd surgery and not very many surgeons would be willing to do this on her.  She does have yearly CT scan.  She is also having issues with recurrent UTIs, likely related to the diarrhea, as well as vulvar years issues.  She recently saw Dr. Myna Hidalgo and had urinalysis.  They felt a urine culture was done but it appears this was ordered but possibly not sent.  She is willing to have this repeated today.  Has recently taken two courses of abx.    She does have h/o DVT/PE and has IVC placed.  She has needed surgery to remove extensive clotting in the IVC.  She is currently on Arixtra.  She is followed by Dr. Myna Hidalgo for this hx and management.    She has more recently started to feel like there is air coming from the vagina.  She has not noticed any stool.  She admits she has so much diarrhea that it's sometimes hard to know what is coming from where.  Has some concerns she may have a fistula.  Lastly, she expressed a lot of appreciation for Dr. Floreen Comber but the traveling distance for these appointments is really starting to get more difficult for her.  We discussed possible referral to Dr. Lenis Noon at [facility] who is a surgical oncologist and has experience with HIPEC.    No LMP recorded.  Patient has had a hysterectomy.    Last pap was years ago and is not indicated. Last mammogram: 05/03/2023. Results were: normal.       12/12/2023    4:47 PM 04/17/2023    2:11 PM 03/09/2023   11:47 AM 12/06/2022    9:51 AM 08/04/2022    9:56 AM  Depression screen PHQ 2/9  Decreased Interest 0 3 3 3 2   Down, Depressed, Hopeless 0 3 3 3 3   PHQ - 2 Score 0 6 6 6 5   Altered sleeping  3 3 3  0  Tired, decreased energy  3 3 3 3   Change in appetite  3 1 3 3   Feeling bad or failure about yourself   3 3  3 2   Trouble concentrating  2 3 3 3   Moving slowly or fidgety/restless  2 3 3  0  Suicidal thoughts  2 0 0 0  PHQ-9 Score  24 22 24 16   Difficult doing work/chores   Very difficult       Review of Systems:   Pertinent items are noted in HPI.  Denies vaginal bleeding.  Pertinent History Reviewed:  Reviewed past medical,surgical, social and family history.  Reviewed problem list, medications and allergies. Physical Assessment:   Vitals:   12/12/23 1645  BP: (!) 129/97  Pulse: 74  Weight: 238 lb 6.4 oz (108.1 kg)  Height: 5\' 6"  (1.676 m)  Body mass index is 38.48 kg/m.        Physical Examination:   General appearance - well appearing, and in no distress  Mental status - alert, oriented to person, place, and time  Psych:  She has a normal mood and affect  Skin - warm and dry, normal color, no suspicious lesions noted  Pelvic - VULVA: normal appearing vulva with no masses, tenderness or lesions   VAGINA: atrophic appearing vagina with normal color and discharge, no lesions, no evidence of stool or fistula noted   CERVIX: surgically absent  Thin prep pap is not obtained.  Not indicated.  UTERUS: surgically absent  ADNEXA: No adnexal masses or tenderness noted.  Chaperone present for exam  Assessment & Plan:  1. H/O: hysterectomy - no evidence for fistula on exam today  2. Recurrent UTI (Primary) - will obtained culture today - Urine Culture  3. History of DVT (deep vein thrombosis) - followed by Dr. Myna Hidalgo and on Arixtra  4. Presence of IVC filter  5. Malignant pseudomyxoma peritonei [facility]) - currently followed by Dr. Floreen Comber at [facility] - will refer to Dr. Lenis Noon at [facility] to see if she could have care closer or at least a team most closely that would know her history in case she had an urgent need.   Orders Placed This Encounter  Procedures   Urine Culture    Meds: No orders of the defined types were placed in this encounter.   Follow-up: No follow-ups on  file.  Total time with pt:  58 minutes Time with chart review: 18 minutes Total time:  76 minutes  Jerene Bears, MD 12/15/2023 7:15 PM

## 2023-12-15 ENCOUNTER — Encounter (HOSPITAL_BASED_OUTPATIENT_CLINIC_OR_DEPARTMENT_OTHER): Payer: Self-pay | Admitting: Obstetrics & Gynecology

## 2023-12-17 LAB — URINE CULTURE

## 2023-12-18 ENCOUNTER — Other Ambulatory Visit (HOSPITAL_BASED_OUTPATIENT_CLINIC_OR_DEPARTMENT_OTHER): Payer: Self-pay | Admitting: *Deleted

## 2023-12-18 MED ORDER — NITROFURANTOIN MONOHYD MACRO 100 MG PO CAPS: 100.0000 mg | ORAL_CAPSULE | Freq: Two times a day (BID) | ORAL | 0 refills | Status: DC

## 2023-12-19 ENCOUNTER — Encounter: Payer: Self-pay | Admitting: Behavioral Health

## 2023-12-19 ENCOUNTER — Ambulatory Visit (INDEPENDENT_AMBULATORY_CARE_PROVIDER_SITE_OTHER): Payer: Medicare Other | Admitting: Behavioral Health

## 2023-12-19 ENCOUNTER — Encounter: Payer: Self-pay | Admitting: Hematology & Oncology

## 2023-12-19 DIAGNOSIS — F331 Major depressive disorder, recurrent, moderate: Secondary | ICD-10-CM | POA: Diagnosis not present

## 2023-12-19 DIAGNOSIS — F411 Generalized anxiety disorder: Secondary | ICD-10-CM

## 2023-12-19 DIAGNOSIS — F99 Mental disorder, not otherwise specified: Secondary | ICD-10-CM

## 2023-12-19 DIAGNOSIS — F5105 Insomnia due to other mental disorder: Secondary | ICD-10-CM

## 2023-12-19 MED ORDER — BUPROPION HCL ER (XL) 300 MG PO TB24: 300.0000 mg | ORAL_TABLET | Freq: Every day | ORAL | 0 refills | Status: DC

## 2023-12-19 MED ORDER — ESZOPICLONE 2 MG PO TABS: ORAL_TABLET | ORAL | 0 refills | Status: DC

## 2023-12-19 MED ORDER — BUSPIRONE HCL 15 MG PO TABS: 15.0000 mg | ORAL_TABLET | Freq: Three times a day (TID) | ORAL | 0 refills | Status: DC

## 2023-12-19 MED ORDER — CITALOPRAM HYDROBROMIDE 40 MG PO TABS: 40.0000 mg | ORAL_TABLET | Freq: Every day | ORAL | 1 refills | Status: DC

## 2023-12-19 MED ORDER — MIRTAZAPINE 7.5 MG PO TABS: 7.5000 mg | ORAL_TABLET | Freq: Every day | ORAL | 1 refills | Status: DC

## 2023-12-19 NOTE — Progress Notes (Signed)
 [facility]  Patient ID: Wendy Barber,  MRN: [id number]  PCP: Shade Flood, MD  Date of Evaluation: 12/19/2023 Time spent:30 minutes  Chief Complaint:  Chief Complaint   Anxiety; Depression; Follow-up; Medication Refill; Patient Education     HISTORY/CURRENT STATUS: HPI 63 year old female presents to this office  for follow up and medication management.   Still facing a multitude of health issues. She is feeling a little better physically but still reporting moderate situational depression.  Says she is not sure if Auvelity helped her depression but she stopped the medication while in the hospital. She would like to get feedback from family before starting back. Requesting no other med changes this visit.  She understands that depression related to chronic disease is very difficult to treat. Reports her anxiety today at 3/10 and depression at 4/10. She is sleeping 6 hours per night. Her family remains highly supportive. She denies any mania, no psychosis, No current SI or HI.       No prior psychiatric medications   Individual Medical History/ Review of Systems: Changes? :No   Allergies: Ativan [lorazepam], Corticosteroids, Penicillins, Alprazolam, Erythromycin, Gabapentin, Prednisolone, Prednisone, and Savella  [milnacipran]  Current Medications:  Current Outpatient Medications:    mirtazapine (REMERON) 7.5 MG tablet, Take 1 tablet (7.5 mg total) by mouth at bedtime., Disp: 30 tablet, Rfl: 1   amLODipine (NORVASC) 5 MG tablet, TAKE 1 TABLET(5 MG) BY MOUTH DAILY, Disp: 90 tablet, Rfl: 1   buPROPion (WELLBUTRIN XL) 300 MG 24 hr tablet, Take 1 tablet (300 mg total) by mouth daily., Disp: 30 tablet, Rfl: 0   busPIRone (BUSPAR) 15 MG tablet, Take 1 tablet (15 mg total) by mouth 3 (three) times daily., Disp: 270 tablet, Rfl: 0   cabergoline (DOSTINEX) 0.5 MG tablet, Take 0.5 tablets (0.25 mg total) by mouth 2 (two) times a week., Disp: 12 tablet, Rfl: 2   citalopram  (CELEXA) 40 MG tablet, Take 1 tablet (40 mg total) by mouth daily., Disp: 90 tablet, Rfl: 1   Dextromethorphan-buPROPion ER (AUVELITY) 45-105 MG TBCR, Take one tablet by mouth for 3 days, then take two tablet daily 7-8 hours between doses, Disp: 60 tablet, Rfl: 1   diazepam (VALIUM) 5 MG tablet, Take 1 tab 30 minutes prior to enclosed imaging study., Disp: 4 tablet, Rfl: 1   dicyclomine (BENTYL) 20 MG tablet, TAKE 1 TABLET(20 MG) BY MOUTH THREE TIMES DAILY BEFORE MEALS, Disp: 90 tablet, Rfl: 4   diphenoxylate-atropine (LOMOTIL) 2.5-0.025 MG tablet, TAKE 2 TABLETS BY MOUTH FOUR TIMES DAILY AS NEEDED FOR LOOSE STOOLS OR DIARRHEA, Disp: 240 tablet, Rfl: 0   eszopiclone (LUNESTA) 2 MG TABS tablet, TAKE 1 TABLET(2 MG) BY MOUTH AT BEDTIME AS NEEDED FOR SLEEP, Disp: 30 tablet, Rfl: 0   famotidine (PEPCID) 20 MG tablet, TAKE 1 TABLET(20 MG) BY MOUTH TWICE DAILY, Disp: 60 tablet, Rfl: 0   fondaparinux (ARIXTRA) 10 MG/0.8ML SOLN injection, Inject 0.8 mLs (10 mg total) into the skin daily., Disp: 72 mL, Rfl: 3   glimepiride (AMARYL) 4 MG tablet, Take 1 tablet (4 mg total) by mouth daily with breakfast., Disp: 30 tablet, Rfl: 4   hydrocortisone (ANUSOL-HC) 2.5 % rectal cream, PLACE RECTALLY 4 TIMES DAILY AS NEEDED FOR HEMORRHOIDS OR ANAL ITCHING, Disp: 30 g, Rfl: 1   HYDROmorphone (DILAUDID) 4 MG tablet, Take 1 tablet (4 mg total) by mouth every 6 (six) hours as needed for severe pain (pain score 7-10)., Disp: 60 tablet, Rfl: 0  loperamide (IMODIUM) 2 MG capsule, Take 2 capsules (4 mg total) by mouth 4 (four) times daily as needed for diarrhea or loose stools., Disp: , Rfl:    meclizine (ANTIVERT) 25 MG tablet, TAKE 1 TABLET(25 MG) BY MOUTH THREE TIMES DAILY AS NEEDED FOR DIZZINESS, Disp: 60 tablet, Rfl: 0   nitrofurantoin, macrocrystal-monohydrate, (MACROBID) 100 MG capsule, Take 1 capsule (100 mg total) by mouth 2 (two) times daily., Disp: 14 capsule, Rfl: 0   nystatin-triamcinolone ointment (MYCOLOG), Apply 1  Application topically 2 (two) times daily. For up to 7 days and then STOP.  Repeat as needed for flares after at least a 2 week break, Disp: 30 g, Rfl: 0   ondansetron (ZOFRAN) 8 MG tablet, TAKE 1 TABLET(8 MG) BY MOUTH EVERY 8 HOURS AS NEEDED FOR NAUSEA OR VOMITING, Disp: 30 tablet, Rfl: 2   orphenadrine (NORFLEX) 100 MG tablet, TAKE 1 TABLET(100 MG) BY MOUTH AT BEDTIME AS NEEDED FOR MUSCLE SPASMS, Disp: 30 tablet, Rfl: 2   pantoprazole (PROTONIX) 40 MG tablet, TAKE 1 TABLET BY MOUTH EVERY DAY BEFORE BREAKFAST, Disp: 90 tablet, Rfl: 1   potassium chloride (KLOR-CON M) 10 MEQ tablet, Take 2 tablets (20 mEq total) by mouth daily., Disp: 60 tablet, Rfl: 1   pramoxine-hydrocortisone (PROCTOCREAM-HC) 1-1 % rectal cream, APPLY RECTALLY TO THE AFFECTED AREA TWICE DAILY, Disp: 30 g, Rfl: 3   promethazine (PHENERGAN) 12.5 MG tablet, Take 1 tablet (12.5 mg total) by mouth every 6 (six) hours as needed for nausea or vomiting., Disp: 90 tablet, Rfl: 3   propranolol (INDERAL) 20 MG tablet, TAKE 1 TABLET(20 MG) BY MOUTH TWICE DAILY, Disp: 180 tablet, Rfl: 1 Medication Side Effects: none  Family Medical/ Social History: Changes? No  MENTAL HEALTH EXAM:  There were no vitals taken for this visit.There is no height or weight on file to calculate BMI.  General Appearance: Casual, Neat, and Well Groomed  Eye Contact:  Good  Speech:  Clear and Coherent  Volume:  Normal  Mood:  Depressed and Dysphoric  Affect:  Appropriate  Thought Process:  Coherent  Orientation:  Full (Time, Place, and Person)  Thought Content: Logical   Suicidal Thoughts:  No  Homicidal Thoughts:  No  Memory:  WNL  Judgement:  Good  Insight:  Good  Psychomotor Activity:  Normal  Concentration:  Concentration: Good  Recall:  Good  Fund of Knowledge: Good  Language: Good  Assets:  Desire for Improvement  ADL's:  Intact  Cognition: WNL  Prognosis:  Good    DIAGNOSES:    ICD-10-CM   1. Insomnia due to other mental disorder   F51.05 mirtazapine (REMERON) 7.5 MG tablet   F99 eszopiclone (LUNESTA) 2 MG TABS tablet    2. Major depressive disorder, recurrent episode, moderate (HCC)  F33.1 citalopram (CELEXA) 40 MG tablet    buPROPion (WELLBUTRIN XL) 300 MG 24 hr tablet    busPIRone (BUSPAR) 15 MG tablet    3. Generalized anxiety disorder  F41.1 citalopram (CELEXA) 40 MG tablet    buPROPion (WELLBUTRIN XL) 300 MG 24 hr tablet    busPIRone (BUSPAR) 15 MG tablet      Receiving Psychotherapy: No     RECOMMENDATIONS:   Greater than 50% of 30 min face to face with patient was spent on counseling and coordination of care. Still struggling with health issue but now a little more ambulatory. She would like to get out of the house more and increase walks if possible. We discussed her stopping Auvelity  and will reassess next visit.  We agreed to: Stopped the Auvelity Continue Buspar  to 15 mg to  three times daily as needed. To start Mirtazapine 7.5 mg at bedtime daily  Continue Celexa to 40 mg daily. Will take with food to help with nausea. Could make diarrhea worse in the beginning but may pass after a week or two.  To Continue Lunesta 5 mg at betime. Belsomra was not approved by insurance. She is happy with Alfonso Patten but says causes metallic taste in mouth next day.  Will report worsening symptoms or side effects promptly To follow up in 4  weeks to reassess Will continue to follow up with PCP regularly Provided emergency contact information Reviewed PDMP     [name], NP

## 2023-12-21 ENCOUNTER — Other Ambulatory Visit (HOSPITAL_COMMUNITY): Payer: Self-pay

## 2023-12-21 NOTE — Progress Notes (Deleted)
 Name: Wendy Barber  MRN/ DOB: [id number], [birth year]    Age/ Sex: 63 y.o., female    PCP: Shade Flood, MD   Reason for Endocrinology Evaluation: Pituitary adenoma     Date of Initial Endocrinology Evaluation: 04/17/2023    HPI: Wendy Barber is a 63 y.o. female with a past medical history of HTN, CAD, Hx DVT/PE, Cancer of appendic with mets , autoimmune hepatitis and DM. The patient presented for initial endocrinology clinic visit on 04/17/2023 for consultative assistance with her pituitary adenoma.   During evaluation by neurology for vertigo and cerebral meningioma she was noted to have a pituitary adenoma on brain MRI 2023    Patient follows with oncology for history of metastatic appendiceal cancer, s/p surgery 2022    Patient had an incidental finding of thyroid nodule on CT scan which prompted thyroid ultrasound 11/2022, revealing a right inferior 2.3 cm nodule meeting follow-up criteria.    S/P hysterectomy 2004 , no HRT     Pituitary labs showed elevated ACTH at 76.3 pg/mL ( 7.2-63.3) , 24 hr urine cortisol was normal , she also had elevated Prolactin 49.9 ng/dL ),   IGF -1 was normal as well as TFT's.   Started cabergoline 05/2023 with a prolactin of 49.9 NG/mL   SUBJECTIVE:      Today (06/23/23):  Wendy Barber is here for follow-up on pituitary macroadenoma and MNG.   She has been following up at [facility] for malignant pseudomyxoma peritonei Was recently evaluated by GYN due to concerns about fistula, no evidence of fistula was noted  She is on lanreotide injections -monthly -due to chronic diarrhea she is being treated for a presumptive diagnosis of carcinoid. She is on Arixtra   She has occasional headaches which have worsened over the past year Has worsening  nausea with headaches Has noted decrease in Vision, last eye exam ~ 1 yr ago    She has not taken in glucocorticoids in > 3 months  Denies local neck swelling  Has occasional  Dysphagia  Continues with diarrhea for the past 2.5 yrs  She is not on Vitamins  Has LE edema with pain in the legs  Has been diagnosed with hypokalemia in the past, not currently on Kcl  Has abd        HISTORY:  Past Medical History:  Past Medical History:  Diagnosis Date   Arthritis    Back pain    Cancer (HCC)    pseudomyxoma peritonei   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   Chronic fatigue syndrome    Diabetes mellitus without complication (HCC)    DVT of deep femoral vein, left (HCC) 03/19/2020   Fibromyalgia    Goals of care, counseling/discussion 03/19/2020   Hypertension    Iron deficiency anemia due to chronic blood loss 05/14/2021   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   Pernicious anemia 05/14/2021   Presence of IVC filter 03/19/2020   Pulmonary embolism, bilateral (HCC) 03/19/2020   Short bowel syndrome, unspecified    Past Surgical History:  Past Surgical History:  Procedure Laterality Date   ABDOMINAL SURGERY  2005   cytoreductive surgery with splenectomy, [facility]   ABDOMINAL SURGERY  2022   ACHILLES TENDON REPAIR     ARTHROPLASTY     CARPAL TUNNEL RELEASE     CHOLECYSTECTOMY  07/1994   IR CATHETER TUBE CHANGE  02/02/2021   IR CATHETER TUBE CHANGE  02/25/2021   IR IMAGING  GUIDED PORT INSERTION  06/20/2022   IR RADIOLOGIST EVAL & MGMT  02/24/2021   IR RADIOLOGIST EVAL & MGMT  03/10/2021   IR RADIOLOGIST EVAL & MGMT  06/22/2022   IR THROMBECT VENO MECH MOD SED  06/20/2022   IR US GUIDE BX ASP/DRAIN  11/25/2019   IR US GUIDE VASC ACCESS LEFT  06/20/2022   IR US GUIDE VASC ACCESS RIGHT  06/20/2022   IR US GUIDE VASC ACCESS RIGHT  06/20/2022   IR VENO/EXT/BI  06/20/2022   IR VENOCAVAGRAM IVC  06/20/2022   JOINT REPLACEMENT Bilateral    knees   KNEE ARTHROSCOPY     ORIF ANKLE FRACTURE Right 08/27/2019   Procedure: OPEN REDUCTION INTERNAL FIXATION RIGHT ANKLE FRACTURE;  Surgeon: Sheral Apley,  MD;  Location: [facility];  Service: Orthopedics;  Laterality: Right;   perineorrophy     TONGUE BIOPSY     TOTAL ABDOMINAL HYSTERECTOMY Bilateral 2004   with BSO and appendectomy    Social History:  reports that she has never smoked. She has never used smokeless tobacco. She reports that she does not drink alcohol and does not use drugs. Family History: family history includes Alcohol abuse in her brother; Depression in her mother; Drug abuse in her brother and sister; Suicidality in her sister.   HOME MEDICATIONS: Allergies as of 12/22/2023       Reactions   Ativan [lorazepam] Swelling, Other (See Comments)   Face & Throat Swelling  Note: tolerates midazolam fine   Corticosteroids Other (See Comments)   "Psychotic behavior"   Penicillins Shortness Of Breath, Other (See Comments)   Irregular and rapid Heart Rate, too   Alprazolam Hives, Other (See Comments)   Hard to arouse, unresponsiveness also   Erythromycin Nausea And Vomiting      Gabapentin Other (See Comments)   Made the patient feel depressed   Prednisolone Anxiety   Prednisone Anxiety, Other (See Comments)   "Anxiety & Nervous Breakdown"   Savella  [milnacipran] Other (See Comments)   Reaction not noted        Medication List        Accurate as of December 21, 2023 10:22 AM. If you have any questions, ask your nurse or doctor.          amLODipine 5 MG tablet Commonly known as: NORVASC TAKE 1 TABLET(5 MG) BY MOUTH DAILY   Auvelity 45-105 MG Tbcr Generic drug: Dextromethorphan-buPROPion ER Take one tablet by mouth for 3 days, then take two tablet daily 7-8 hours between doses   buPROPion 300 MG 24 hr tablet Commonly known as: WELLBUTRIN XL Take 1 tablet (300 mg total) by mouth daily.   busPIRone 15 MG tablet Commonly known as: BUSPAR Take 1 tablet (15 mg total) by mouth 3 (three) times daily.   cabergoline 0.5 MG tablet Commonly known as: DOSTINEX Take 0.5 tablets (0.25 mg total) by mouth 2 (two)  times a week.   citalopram 40 MG tablet Commonly known as: CELEXA Take 1 tablet (40 mg total) by mouth daily.   diazepam 5 MG tablet Commonly known as: VALIUM Take 1 tab 30 minutes prior to enclosed imaging study.   dicyclomine 20 MG tablet Commonly known as: BENTYL TAKE 1 TABLET(20 MG) BY MOUTH THREE TIMES DAILY BEFORE MEALS   diphenoxylate-atropine 2.5-0.025 MG tablet Commonly known as: LOMOTIL TAKE 2 TABLETS BY MOUTH FOUR TIMES DAILY AS NEEDED FOR LOOSE STOOLS OR DIARRHEA   eszopiclone 2 MG Tabs tablet Commonly known as: LUNESTA TAKE  1 TABLET(2 MG) BY MOUTH AT BEDTIME AS NEEDED FOR SLEEP   famotidine 20 MG tablet Commonly known as: PEPCID TAKE 1 TABLET(20 MG) BY MOUTH TWICE DAILY   fondaparinux 10 MG/0.8ML Soln injection Commonly known as: Arixtra Inject 0.8 mLs (10 mg total) into the skin daily.   glimepiride 4 MG tablet Commonly known as: Amaryl Take 1 tablet (4 mg total) by mouth daily with breakfast.   hydrocortisone 2.5 % rectal cream Commonly known as: ANUSOL-HC PLACE RECTALLY 4 TIMES DAILY AS NEEDED FOR HEMORRHOIDS OR ANAL ITCHING   HYDROmorphone 4 MG tablet Commonly known as: Dilaudid Take 1 tablet (4 mg total) by mouth every 6 (six) hours as needed for severe pain (pain score 7-10).   loperamide 2 MG capsule Commonly known as: IMODIUM Take 2 capsules (4 mg total) by mouth 4 (four) times daily as needed for diarrhea or loose stools.   meclizine 25 MG tablet Commonly known as: ANTIVERT TAKE 1 TABLET(25 MG) BY MOUTH THREE TIMES DAILY AS NEEDED FOR DIZZINESS   mirtazapine 7.5 MG tablet Commonly known as: REMERON Take 1 tablet (7.5 mg total) by mouth at bedtime.   nitrofurantoin (macrocrystal-monohydrate) 100 MG capsule Commonly known as: MACROBID Take 1 capsule (100 mg total) by mouth 2 (two) times daily.   nystatin-triamcinolone ointment Commonly known as: MYCOLOG Apply 1 Application topically 2 (two) times daily. For up to 7 days and then STOP.   Repeat as needed for flares after at least a 2 week break   ondansetron 8 MG tablet Commonly known as: ZOFRAN TAKE 1 TABLET(8 MG) BY MOUTH EVERY 8 HOURS AS NEEDED FOR NAUSEA OR VOMITING   orphenadrine 100 MG tablet Commonly known as: NORFLEX TAKE 1 TABLET(100 MG) BY MOUTH AT BEDTIME AS NEEDED FOR MUSCLE SPASMS   pantoprazole 40 MG tablet Commonly known as: PROTONIX TAKE 1 TABLET BY MOUTH EVERY DAY BEFORE BREAKFAST   potassium chloride 10 MEQ tablet Commonly known as: KLOR-CON M Take 2 tablets (20 mEq total) by mouth daily.   pramoxine-hydrocortisone 1-1 % rectal cream Commonly known as: PROCTOCREAM-HC APPLY RECTALLY TO THE AFFECTED AREA TWICE DAILY   promethazine 12.5 MG tablet Commonly known as: PHENERGAN Take 1 tablet (12.5 mg total) by mouth every 6 (six) hours as needed for nausea or vomiting.   propranolol 20 MG tablet Commonly known as: INDERAL TAKE 1 TABLET(20 MG) BY MOUTH TWICE DAILY          REVIEW OF SYSTEMS: A comprehensive ROS was conducted with the patient and is negative except as per HPI     OBJECTIVE:  VS: There were no vitals taken for this visit.   Wt Readings from Last 3 Encounters:  12/12/23 238 lb 6.4 oz (108.1 kg)  12/04/23 244 lb (110.7 kg)  11/09/23 239 lb 12.8 oz (108.8 kg)     EXAM: General: Pt appears well and is in NAD  Neck: General: Supple without adenopathy. Thyroid: Thyroid size normal.  No goiter or nodules appreciated.  Lungs: Clear with good BS bilat   Heart: Auscultation: RRR.  Abdomen: Soft, nontender  Extremities:  BL LE: No pretibial edema   Mental Status: Judgment, insight: Intact Orientation: Oriented to time, place, and person Mood and affect: No depression, anxiety, or agitation     DATA REVIEWED:   Latest Reference Range & Units 04/17/23 12:17  Sodium 134 - 144 mmol/L 140  Potassium 3.5 - 5.2 mmol/L 4.6  Chloride 96 - 106 mmol/L 106  CO2 20 - 29 mmol/L 20  Glucose 70 - 99 mg/dL 540 (H)  BUN 8 - 27  mg/dL 19  Creatinine 9.81 - 1.91 mg/dL 4.78 (H)  Calcium 8.7 - 10.3 mg/dL 9.3  BUN/Creatinine Ratio 12 - 28  18  eGFR >59 mL/min/1.73 62     Latest Reference Range & Units 04/17/23 12:17  Insulin-Like GF-1 60 - 207 ng/mL 92  FSH 25.8 - 134.8 mIU/mL 21.8 (L)  Prolactin 3.6 - 25.2 ng/mL 49.9 (H)  Glucose 70 - 99 mg/dL 295 (H)  (L): Data is abnormally low (H): Data is abnormally high     Thyroid Ultrasound 12/08/2022    Estimated total number of nodules >/= 1 cm: 1   Number of spongiform nodules >/=  2 cm not described below (TR1): 0   Number of mixed cystic and solid nodules >/= 1.5 cm not described below (TR2): 0   _________________________________________________________   Nodule # 1:   Location: Right; Inferior   Maximum size: 2.3 cm; Other 2 dimensions: 1.5 x 2.3 cm   Composition: solid/almost completely solid (2)   Echogenicity: isoechoic (1)   Shape: not taller-than-wide (0)   Margins: ill-defined (0)   Echogenic foci: none (0)   ACR TI-RADS total points: 3.   ACR TI-RADS risk category: TR3 (3 points).   ACR TI-RADS recommendations:   *Given size (>/= 1.5 - 2.4 cm) and appearance, a follow-up ultrasound in 1 year should be considered based on TI-RADS criteria.   _________________________________________________________   IMPRESSION: The nodule seen on CT imaging corresponds with a 2.3 cm ill-defined TI-RADS category 3 nodule versus pseudo nodule in the right lower gland. This lesion meets criteria for imaging surveillance. Recommend follow-up ultrasound in 1 years.    ASSESSMENT/PLAN/RECOMMENDATIONS:   Pituitary Macroadenoma:       -Pituitary MRI showed 11 mm pituitary adenoma, no invasion or mass effect on adjacent structures -Unchanged 19 mm meningioma with straight sinus invasion -Patient with headaches over the past few months -She has a pending appointment with Dr. Vanessa Barbara (ophthalmology) next month -Will repeat MRI of the brain in 6  months   2.  Hyperprolactinemia:   -I have recommended starting cabergoline, cautioned against GI side effects -It is difficult to ascertain if this is due to prolactinoma or stalk effect, will repeat prolactin in 3 months   Medication Start cabergoline 0.5 mg, half a tablet twice weekly   3. Elevated ACTH:   -This has been slightly elevated but trending down -24-hour urinary cortisol has come back normal -Suspect this elevation is stress related, will continue to monitor     4.  Right thyroid nodule:   -No local neck symptoms -TFTs are normal -She will be due for repeat ultrasound by February 2025       Signed electronically by: Lyndle Herrlich, MD  [facility]  [facility] [address] [place], [place] [address] Phone: [phone] FAX: [phone]   CC: Shade Flood, MD [address] [place] [address] Phone: [phone] Fax: [phone]   Return to Endocrinology clinic as below: Future Appointments  Date Time Provider Department Center  12/22/2023 12:10 PM Deatra Mcmahen, [name], MD [facility] None  01/01/2024  1:15 PM [facility] None  01/01/2024  1:30 PM [facility] INJ NURSE [facility] None  01/01/2024  1:45 PM [name], MD [facility] None  01/01/2024  2:00 PM [facility] INJ NURSE [facility] None  02/13/2024  2:30 PM [name], NP [facility] None  07/08/2024  1:30 PM Rennis Chris, MD [facility] None

## 2023-12-22 ENCOUNTER — Ambulatory Visit: Payer: Medicare Other | Admitting: Internal Medicine

## 2023-12-22 ENCOUNTER — Other Ambulatory Visit: Payer: Self-pay | Admitting: Family Medicine

## 2023-12-22 ENCOUNTER — Other Ambulatory Visit (HOSPITAL_COMMUNITY): Payer: Self-pay

## 2023-12-22 ENCOUNTER — Other Ambulatory Visit: Payer: Self-pay | Admitting: Hematology & Oncology

## 2023-12-22 DIAGNOSIS — C181 Malignant neoplasm of appendix: Secondary | ICD-10-CM

## 2023-12-22 DIAGNOSIS — R12 Heartburn: Secondary | ICD-10-CM

## 2023-12-22 DIAGNOSIS — A0472 Enterocolitis due to Clostridium difficile, not specified as recurrent: Secondary | ICD-10-CM

## 2023-12-22 DIAGNOSIS — Z85038 Personal history of other malignant neoplasm of large intestine: Secondary | ICD-10-CM

## 2023-12-29 ENCOUNTER — Ambulatory Visit: Payer: Self-pay | Admitting: Family Medicine

## 2023-12-29 ENCOUNTER — Encounter: Payer: Self-pay | Admitting: Family Medicine

## 2023-12-29 ENCOUNTER — Emergency Department (HOSPITAL_COMMUNITY)

## 2023-12-29 ENCOUNTER — Encounter: Payer: Self-pay | Admitting: Hematology & Oncology

## 2023-12-29 ENCOUNTER — Encounter (HOSPITAL_COMMUNITY): Payer: Self-pay

## 2023-12-29 ENCOUNTER — Emergency Department (HOSPITAL_COMMUNITY)
Admission: EM | Admit: 2023-12-29 | Discharge: 2023-12-29 | Disposition: A | Discharge status: Home / Self Care | Attending: Emergency Medicine | Admitting: Emergency Medicine

## 2023-12-29 ENCOUNTER — Other Ambulatory Visit: Payer: Self-pay

## 2023-12-29 DIAGNOSIS — Z86711 Personal history of pulmonary embolism: Secondary | ICD-10-CM | POA: Insufficient documentation

## 2023-12-29 DIAGNOSIS — I10 Essential (primary) hypertension: Secondary | ICD-10-CM | POA: Diagnosis not present

## 2023-12-29 DIAGNOSIS — M47814 Spondylosis without myelopathy or radiculopathy, thoracic region: Secondary | ICD-10-CM | POA: Insufficient documentation

## 2023-12-29 DIAGNOSIS — K625 Hemorrhage of anus and rectum: Secondary | ICD-10-CM | POA: Diagnosis present

## 2023-12-29 DIAGNOSIS — R0609 Other forms of dyspnea: Secondary | ICD-10-CM | POA: Diagnosis not present

## 2023-12-29 DIAGNOSIS — Z86718 Personal history of other venous thrombosis and embolism: Secondary | ICD-10-CM | POA: Diagnosis not present

## 2023-12-29 DIAGNOSIS — Z9081 Acquired absence of spleen: Secondary | ICD-10-CM | POA: Insufficient documentation

## 2023-12-29 DIAGNOSIS — Z79899 Other long term (current) drug therapy: Secondary | ICD-10-CM | POA: Diagnosis not present

## 2023-12-29 DIAGNOSIS — Z7984 Long term (current) use of oral hypoglycemic drugs: Secondary | ICD-10-CM | POA: Insufficient documentation

## 2023-12-29 DIAGNOSIS — E119 Type 2 diabetes mellitus without complications: Secondary | ICD-10-CM | POA: Diagnosis not present

## 2023-12-29 DIAGNOSIS — K8689 Other specified diseases of pancreas: Secondary | ICD-10-CM | POA: Insufficient documentation

## 2023-12-29 DIAGNOSIS — N1330 Unspecified hydronephrosis: Secondary | ICD-10-CM | POA: Insufficient documentation

## 2023-12-29 DIAGNOSIS — K760 Fatty (change of) liver, not elsewhere classified: Secondary | ICD-10-CM | POA: Diagnosis not present

## 2023-12-29 DIAGNOSIS — N200 Calculus of kidney: Secondary | ICD-10-CM | POA: Insufficient documentation

## 2023-12-29 DIAGNOSIS — M2578 Osteophyte, vertebrae: Secondary | ICD-10-CM | POA: Diagnosis not present

## 2023-12-29 DIAGNOSIS — R0602 Shortness of breath: Secondary | ICD-10-CM | POA: Insufficient documentation

## 2023-12-29 DIAGNOSIS — Z85038 Personal history of other malignant neoplasm of large intestine: Secondary | ICD-10-CM | POA: Insufficient documentation

## 2023-12-29 DIAGNOSIS — I700 Atherosclerosis of aorta: Secondary | ICD-10-CM | POA: Diagnosis not present

## 2023-12-29 DIAGNOSIS — N132 Hydronephrosis with renal and ureteral calculous obstruction: Secondary | ICD-10-CM | POA: Insufficient documentation

## 2023-12-29 DIAGNOSIS — E669 Obesity, unspecified: Secondary | ICD-10-CM | POA: Insufficient documentation

## 2023-12-29 DIAGNOSIS — K649 Unspecified hemorrhoids: Secondary | ICD-10-CM | POA: Diagnosis not present

## 2023-12-29 DIAGNOSIS — R1030 Lower abdominal pain, unspecified: Secondary | ICD-10-CM | POA: Diagnosis not present

## 2023-12-29 DIAGNOSIS — Z6838 Body mass index (BMI) 38.0-38.9, adult: Secondary | ICD-10-CM | POA: Insufficient documentation

## 2023-12-29 HISTORY — DX: Hemorrhage of anus and rectum: K62.5

## 2023-12-29 LAB — CBC WITH DIFFERENTIAL/PLATELET
Abs Immature Granulocytes: 0.03 10*3/uL (ref 0.00–0.07)
Basophils Absolute: 0.1 10*3/uL (ref 0.0–0.1)
Basophils Relative: 1 %
Eosinophils Absolute: 0.3 10*3/uL (ref 0.0–0.5)
Eosinophils Relative: 3 %
HCT: 41.7 % (ref 36.0–46.0)
Hemoglobin: 13.5 g/dL (ref 12.0–15.0)
Immature Granulocytes: 0 %
Lymphocytes Relative: 36 %
Lymphs Abs: 3.5 10*3/uL (ref 0.7–4.0)
MCH: 31.3 pg (ref 26.0–34.0)
MCHC: 32.4 g/dL (ref 30.0–36.0)
MCV: 96.5 fL (ref 80.0–100.0)
Monocytes Absolute: 0.7 10*3/uL (ref 0.1–1.0)
Monocytes Relative: 7 %
Neutro Abs: 5.3 10*3/uL (ref 1.7–7.7)
Neutrophils Relative %: 53 %
Platelets: 238 10*3/uL (ref 150–400)
RBC: 4.32 MIL/uL (ref 3.87–5.11)
RDW: 13.4 % (ref 11.5–15.5)
WBC: 9.9 10*3/uL (ref 4.0–10.5)
nRBC: 0 % (ref 0.0–0.2)

## 2023-12-29 LAB — URINALYSIS, ROUTINE W REFLEX MICROSCOPIC
Bilirubin Urine: NEGATIVE
Glucose, UA: NEGATIVE mg/dL
Hgb urine dipstick: NEGATIVE
Ketones, ur: NEGATIVE mg/dL
Leukocytes,Ua: NEGATIVE
Nitrite: NEGATIVE
Protein, ur: 100 mg/dL — AB
Specific Gravity, Urine: 1.025 (ref 1.005–1.030)
pH: 5 (ref 5.0–8.0)

## 2023-12-29 LAB — COMPREHENSIVE METABOLIC PANEL
ALT: 54 U/L — ABNORMAL HIGH (ref 0–44)
AST: 58 U/L — ABNORMAL HIGH (ref 15–41)
Albumin: 3.6 g/dL (ref 3.5–5.0)
Alkaline Phosphatase: 197 U/L — ABNORMAL HIGH (ref 38–126)
Anion gap: 11 (ref 5–15)
BUN: 15 mg/dL (ref 8–23)
CO2: 23 mmol/L (ref 22–32)
Calcium: 9.1 mg/dL (ref 8.9–10.3)
Chloride: 105 mmol/L (ref 98–111)
Creatinine, Ser: 1.2 mg/dL — ABNORMAL HIGH (ref 0.44–1.00)
GFR, Estimated: 52 mL/min — ABNORMAL LOW (ref >60)
Glucose, Bld: 250 mg/dL — ABNORMAL HIGH (ref 70–99)
Potassium: 4.2 mmol/L (ref 3.5–5.1)
Sodium: 139 mmol/L (ref 135–145)
Total Bilirubin: 0.5 mg/dL (ref 0.0–1.2)
Total Protein: 7.3 g/dL (ref 6.5–8.1)

## 2023-12-29 LAB — RESP PANEL BY RT-PCR (RSV, FLU A&B, COVID)  RVPGX2
Influenza A by PCR: NEGATIVE
Influenza B by PCR: NEGATIVE
Resp Syncytial Virus by PCR: NEGATIVE
SARS Coronavirus 2 by RT PCR: NEGATIVE

## 2023-12-29 LAB — TROPONIN I (HIGH SENSITIVITY): Troponin I (High Sensitivity): 7 ng/L (ref <18)

## 2023-12-29 MED ORDER — HEPARIN SOD (PORK) LOCK FLUSH 100 UNIT/ML IV SOLN
500.0000 [IU] | Freq: Once | INTRAVENOUS | Status: AC
  Administered 2023-12-29: 500 [IU]
  Filled 2023-12-29: qty 5

## 2023-12-29 MED ORDER — IOHEXOL 350 MG/ML SOLN
75.0000 mL | Freq: Once | INTRAVENOUS | Status: AC | PRN
  Administered 2023-12-29: 75 mL via INTRAVENOUS

## 2023-12-29 MED ORDER — LIDOCAINE-HYDROCORTISONE ACE 3-1 % RE KIT: 1.0000 | PACK | Freq: Two times a day (BID) | RECTAL | 0 refills | Status: AC

## 2023-12-29 MED ORDER — SODIUM CHLORIDE 0.9 % IV BOLUS
1000.0000 mL | Freq: Once | INTRAVENOUS | Status: AC
  Administered 2023-12-29: 1000 mL via INTRAVENOUS

## 2023-12-29 NOTE — Telephone Encounter (Signed)
 Noted and agree with ED evaluation.  Appears she is being evaluated through ED

## 2023-12-29 NOTE — Discharge Instructions (Addendum)
 It was a pleasure caring for you today in the emergency department.  Your workup today is reassuring.  Please follow-up with your primary care doctor for recheck.  You were prescribed a rectal cream that can help with your hemorrhoids  Please return to the emergency department for any worsening or worrisome symptoms.

## 2023-12-29 NOTE — ED Provider Notes (Signed)
 [place] EMERGENCY DEPARTMENT AT [facility] Provider Note  CSN: [id number] Arrival date & time: 12/29/23 1236  Chief Complaint(s) Rectal Bleeding  HPI Wendy Barber is a 63 y.o. female with past medical history as below, significant for appendiceal cancer, DM, DVT on Arixtra, hypertension, IDA, pseudomyxoma peritonei, pernicious anemia, IVC filter, bilateral PE who presents to the ED with complaint of dyspnea, rectal bleeding.  Patient reports has been feeling short of breath over the past 2 weeks, worse in the past 2 or 3 days.  Some exertional dyspnea.  No fevers or coughing.  No chest pain.  Does not use home oxygen.  No recent travel or sick contacts.  Also reports rectal bleeding since last night, no some blood or blood in her stool, has since transition to dark red blood and has nearly resolved at this time.  She has history of hemorrhoids and prior hemorrhoidal bleeding.  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   Chronic fatigue syndrome    Diabetes mellitus without complication (HCC)    DVT of deep femoral vein, left (HCC) 03/19/2020   Fibromyalgia    Goals of care, counseling/discussion 03/19/2020   Hypertension    Iron deficiency anemia due to chronic blood loss 05/14/2021   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   Pernicious anemia 05/14/2021   Presence of IVC filter 03/19/2020   Pulmonary embolism, bilateral (HCC) 03/19/2020   Short bowel syndrome, unspecified    Patient Active Problem List   Diagnosis Date Noted   Hyperprolactinemia (HCC) 06/23/2023   Pituitary macroadenoma (HCC) 06/23/2023   Pituitary adenoma (HCC) 04/17/2023   Pulmonary emboli (HCC) 12/08/2022   Pulmonary embolism (HCC) 12/07/2022   Thyroid nodule 12/07/2022   Enteritis 07/23/2022   Fall at home, initial  encounter 07/23/2022   AMS (altered mental status) 07/23/2022   Hemorrhoids 06/25/2022   Occasional tremors: Essential tremors 06/23/2022   Hypomagnesemia 06/22/2022   Iron deficiency anemia    Gastritis 06/18/2022   Depression 06/18/2022   UTI (urinary tract infection) 06/16/2022   DVT, bilateral lower limbs (HCC) 06/14/2022   Symptomatic bradycardia 06/13/2022   History of excision of intestinal structure 06/13/2022   CAD (coronary artery disease) 06/13/2022   Acute lower GI bleeding 05/04/2022   AKI (acute kidney injury) (HCC) 05/04/2022   History of deep vein thrombosis (DVT) of lower extremity 05/04/2022   History of pulmonary embolus (PE) 05/04/2022   Anxiety 05/04/2022   Pernicious anemia 05/14/2021   Iron deficiency anemia due to chronic blood loss 05/14/2021   Abdominal wall abscess    Surgical wound infection 01/16/2021   C. 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   DVT of deep femoral vein, left (HCC) 03/19/2020   Pulmonary embolism, bilateral (HCC) 03/19/2020   Presence of IVC filter 03/19/2020   Hepatic encephalopathy (HCC) 11/22/2019   Confusion 11/21/2019   Autoimmune hepatitis (HCC) 11/21/2019   Uncontrolled hypertension 11/21/2019   DM2 (diabetes mellitus, type 2) (HCC) 11/21/2019   Closed right ankle fracture 08/27/2019   Acute deep vein thrombosis (DVT) of femoral vein of left lower extremity (HCC) 08/03/2018   History of colon  cancer 08/03/2018   History of partial colectomy 08/03/2018   Spinal stenosis 08/03/2018   Morbid obesity (HCC) 08/03/2018   Cerebral meningioma (HCC) 01/05/2018   Arthritis of right shoulder region 02/18/2017   History of renal calculi 02/14/2016   Midline low back pain 02/14/2016   Class 3 obesity  01/07/2016   Migraine with aura and without status migrainosus, not intractable 01/07/2016   Chronic fatigue syndrome 09/23/2013   GERD (gastroesophageal reflux disease) 09/23/2013   History of hepatitis A 09/23/2013   Rheumatoid arthritis involving multiple sites (HCC) 09/23/2013   Home Medication(s) Prior to Admission medications   Medication Sig Start Date End Date Taking? Authorizing Provider  lidocaine-hydrocortisone (ANAMANTLE) 3-1 % KIT Place 1 Application rectally 2 (two) times daily. 12/29/23  Yes Tanda Rockers A, DO  amLODipine (NORVASC) 5 MG tablet TAKE 1 TABLET(5 MG) BY MOUTH DAILY 11/22/23   Shade Flood, MD  buPROPion (WELLBUTRIN XL) 300 MG 24 hr tablet Take 1 tablet (300 mg total) by mouth daily. 12/19/23   [name], NP  busPIRone (BUSPAR) 15 MG tablet Take 1 tablet (15 mg total) by mouth 3 (three) times daily. 12/19/23   [name], NP  cabergoline (DOSTINEX) 0.5 MG tablet Take 0.5 tablets (0.25 mg total) by mouth 2 (two) times a week. 06/26/23   [name], [name], MD  citalopram (CELEXA) 40 MG tablet Take 1 tablet (40 mg total) by mouth daily. 12/19/23   [name], NP  Dextromethorphan-buPROPion ER (AUVELITY) 45-105 MG TBCR Take one tablet by mouth for 3 days, then take two tablet daily 7-8 hours between doses 03/07/23   White, Arlys John A, NP  diazepam (VALIUM) 5 MG tablet Take 1 tab 30 minutes prior to enclosed imaging study. 06/01/23   Shade Flood, MD  dicyclomine (BENTYL) 20 MG tablet TAKE 1 TABLET(20 MG) BY MOUTH THREE TIMES DAILY BEFORE MEALS 07/26/23   [name], MD  diphenoxylate-atropine (LOMOTIL) 2.5-0.025 MG tablet TAKE 2 TABLETS BY MOUTH FOUR TIMES DAILY AS NEEDED FOR DIARRHEA OR LOOSE STOOLS 12/22/23   [name], MD  eszopiclone (LUNESTA) 2 MG TABS tablet TAKE 1 TABLET(2 MG) BY MOUTH AT BEDTIME AS NEEDED FOR SLEEP 12/19/23   [name], NP  famotidine (PEPCID) 20 MG tablet TAKE 1 TABLET(20 MG) BY MOUTH TWICE DAILY 12/22/23   Shade Flood, MD  fondaparinux (ARIXTRA) 10 MG/0.8ML SOLN injection Inject 0.8 mLs (10 mg total) into the skin daily. 03/15/23   [name], MD  glimepiride (AMARYL) 4 MG tablet Take 1 tablet (4 mg total) by mouth daily with breakfast. 11/06/23   [name], MD  hydrocortisone (ANUSOL-HC) 2.5 % rectal cream PLACE RECTALLY 4 TIMES DAILY AS NEEDED FOR HEMORRHOIDS OR ANAL ITCHING 10/02/23   [name], MD  HYDROmorphone (DILAUDID) 4 MG tablet Take 1 tablet (4 mg total) by mouth every 6 (six) hours as needed for severe pain (pain score 7-10). 12/11/23   [name], MD  loperamide (IMODIUM) 2 MG capsule Take 2 capsules (4 mg total) by mouth 4 (four) times daily as needed for diarrhea or loose stools. 05/08/22   Glade Lloyd, MD  meclizine (ANTIVERT) 25 MG tablet TAKE 1 TABLET(25 MG) BY MOUTH THREE TIMES DAILY AS NEEDED FOR DIZZINESS 12/08/23   [name], MD  mirtazapine (REMERON) 7.5 MG tablet Take 1 tablet (7.5 mg total) by mouth at bedtime. 12/19/23   [name], NP  nitrofurantoin, macrocrystal-monohydrate, (MACROBID) 100  MG capsule Take 1 capsule (100 mg total) by mouth 2 (two) times daily. 12/18/23   Jerene Bears, MD  nystatin-triamcinolone ointment [facility]) Apply 1 Application topically 2 (two) times daily. For up to 7 days and then STOP.  Repeat as needed for flares after at least a 2 week break 01/30/23   [name], DO  ondansetron (ZOFRAN) 8 MG tablet TAKE 1 TABLET(8 MG) BY MOUTH EVERY 8 HOURS AS NEEDED FOR NAUSEA OR VOMITING 12/09/23   [name], MD  orphenadrine (NORFLEX) 100 MG tablet TAKE 1 TABLET(100 MG) BY MOUTH AT BEDTIME AS NEEDED FOR MUSCLE SPASMS 11/07/23   [name], MD  pantoprazole (PROTONIX) 40 MG tablet TAKE 1 TABLET BY MOUTH EVERY DAY BEFORE BREAKFAST 12/04/23   Shade Flood, MD  potassium chloride (KLOR-CON M) 10 MEQ tablet Take 2 tablets (20 mEq total) by mouth daily. 07/07/22   Shade Flood, MD  pramoxine-hydrocortisone  (PROCTOCREAM-HC) 1-1 % rectal cream APPLY RECTALLY TO THE AFFECTED AREA TWICE DAILY 10/24/23   [name], MD  promethazine (PHENERGAN) 12.5 MG tablet Take 1 tablet (12.5 mg total) by mouth every 6 (six) hours as needed for nausea or vomiting. 09/05/23   [name], MD  propranolol (INDERAL) 20 MG tablet TAKE 1 TABLET(20 MG) BY MOUTH TWICE DAILY 10/06/23   Shade Flood, MD                                                                                                                                    Past Surgical History Past Surgical History:  Procedure Laterality Date   ABDOMINAL SURGERY  2005   cytoreductive surgery with splenectomy, [facility]   ABDOMINAL SURGERY  2022   ACHILLES TENDON REPAIR     ARTHROPLASTY     CARPAL TUNNEL RELEASE     CHOLECYSTECTOMY  07/1994   IR CATHETER TUBE CHANGE  02/02/2021   IR CATHETER TUBE CHANGE  02/25/2021   IR IMAGING GUIDED PORT INSERTION  06/20/2022   IR RADIOLOGIST EVAL & MGMT  02/24/2021   IR RADIOLOGIST EVAL & MGMT  03/10/2021   IR RADIOLOGIST EVAL & MGMT  06/22/2022   IR THROMBECT VENO MECH MOD SED  06/20/2022   IR US GUIDE BX ASP/DRAIN  11/25/2019   IR US GUIDE VASC ACCESS LEFT  06/20/2022   IR US GUIDE VASC ACCESS RIGHT  06/20/2022   IR US GUIDE VASC ACCESS RIGHT  06/20/2022   IR VENO/EXT/BI  06/20/2022   IR VENOCAVAGRAM IVC  06/20/2022   JOINT REPLACEMENT Bilateral    knees   KNEE ARTHROSCOPY     ORIF ANKLE FRACTURE Right 08/27/2019   Procedure: OPEN REDUCTION INTERNAL FIXATION RIGHT ANKLE FRACTURE;  Surgeon: Sheral Apley, MD;  Location: [facility];  Service: Orthopedics;  Laterality: Right;   perineorrophy     TONGUE BIOPSY     TOTAL ABDOMINAL HYSTERECTOMY Bilateral 2004  with BSO and appendectomy   Family History Family History  Problem Relation Age of Onset   Depression Mother    Drug abuse Sister    Suicidality Sister    Alcohol abuse Brother    Drug abuse Brother     Social History Social History    Tobacco Use   Smoking status: Never   Smokeless tobacco: Never  Vaping Use   Vaping status: Never Used  Substance Use Topics   Alcohol use: No   Drug use: Never   Allergies Ativan [lorazepam], Corticosteroids, Penicillins, Alprazolam, Erythromycin, Gabapentin, Prednisolone, Prednisone, and Savella  [milnacipran]  Review of Systems A thorough review of systems was obtained and all systems are negative except as noted in the HPI and PMH.   Physical Exam Vital Signs  I have reviewed the triage vital signs BP (!) 155/112   Pulse 65   Temp 98 F (36.7 C) (Oral)   Resp 18   Ht 5\' 6"  (1.676 m)   Wt 108.9 kg   SpO2 96%   BMI 38.74 kg/m  Physical Exam Vitals and nursing note reviewed.  Constitutional:      General: She is not in acute distress.    Appearance: Normal appearance. She is obese.  HENT:     Head: Normocephalic and atraumatic.     Right Ear: External ear normal.     Left Ear: External ear normal.     Nose: Nose normal.     Mouth/Throat:     Mouth: Mucous membranes are moist.  Eyes:     General: No scleral icterus.       Right eye: No discharge.        Left eye: No discharge.  Cardiovascular:     Rate and Rhythm: Normal rate and regular rhythm.     Pulses: Normal pulses.     Heart sounds: Normal heart sounds.  Pulmonary:     Effort: Pulmonary effort is normal. No respiratory distress.     Breath sounds: Normal breath sounds. No stridor.  Abdominal:     General: Abdomen is flat. There is no distension.     Palpations: Abdomen is soft.     Tenderness: There is no abdominal tenderness.  Musculoskeletal:     Cervical back: No rigidity.     Right lower leg: No edema.     Left lower leg: No edema.  Skin:    General: Skin is warm and dry.     Capillary Refill: Capillary refill takes less than 2 seconds.  Neurological:     Mental Status: She is alert.  Psychiatric:        Mood and Affect: Mood normal.        Behavior: Behavior normal. Behavior is  cooperative.     ED Results and Treatments Labs (all labs ordered are listed, but only abnormal results are displayed) Labs Reviewed  COMPREHENSIVE METABOLIC PANEL - Abnormal; Notable for the following components:      Result Value   Glucose, Bld 250 (*)    Creatinine, Ser 1.20 (*)    AST 58 (*)    ALT 54 (*)    Alkaline Phosphatase 197 (*)    GFR, Estimated 52 (*)    All other components within normal limits  URINALYSIS, ROUTINE W REFLEX MICROSCOPIC - Abnormal; Notable for the following components:   Color, Urine AMBER (*)    APPearance CLOUDY (*)    Protein, ur 100 (*)    Bacteria, UA MANY (*)  All other components within normal limits  RESP PANEL BY RT-PCR (RSV, FLU A&B, COVID)  [id number]  CBC WITH DIFFERENTIAL/PLATELET  TROPONIN I (HIGH SENSITIVITY)                                                                                                                          Radiology CT Angio Chest PE W and/or Wo Contrast Result Date: 12/29/2023 CLINICAL DATA:  Pulmonary embolism (PE) suspected, high prob EXAM: CT ANGIOGRAPHY CHEST WITH CONTRAST TECHNIQUE: Multidetector CT imaging of the chest was performed using the standard protocol during bolus administration of intravenous contrast. Multiplanar CT image reconstructions and MIPs were obtained to evaluate the vascular anatomy. RADIATION DOSE REDUCTION: This exam was performed according to the departmental dose-optimization program which includes automated exposure control, adjustment of the mA and/or kV according to patient size and/or use of iterative reconstruction technique. CONTRAST:  75mL OMNIPAQUE IOHEXOL 350 MG/ML SOLN COMPARISON:  Chest radiograph 10/16/2023. Chest CT 10/10/2023, additional prior chest CT. FINDINGS: Cardiovascular: There are no filling defects within the pulmonary arteries to suggest pulmonary embolus. The previous right lower lobe pulmonary arterial web is not confidently seen on the current exam. The thoracic  aorta is normal in caliber. No acute aortic findings. Left chest port with tip in the SVC. Heart is upper normal in size. Trace pericardial fluid. Mediastinum/Nodes: No enlarged mediastinal or hilar lymph nodes. No axillary adenopathy. Decompressed esophagus. Lungs/Pleura: Elevated right hemidiaphragm with adjacent compressive atelectasis. Subsegmental linear atelectasis in the left lower lobe. No acute airspace disease. No pleural effusion. No features of pulmonary edema. Trachea and central airways are clear. Upper Abdomen: Assessed on concurrent abdominal CT, reported separately. Musculoskeletal: Thoracic spondylosis. There is anterior and posterior spurring. Posterior osteophytes at the T6-T7 and T7-T8 level causes mass effect on the spinal canal. Review of the MIP images confirms the above findings. IMPRESSION: 1. No pulmonary embolus. 2. Elevated right hemidiaphragm with adjacent compressive atelectasis. 3. Thoracic spondylosis. Posterior osteophytes at the T6-T7 and T7-T8 level causes mass effect on the spinal canal. Electronically Signed   By: Narda Rutherford M.D.   On: 12/29/2023 23:36   CT ABDOMEN PELVIS W CONTRAST Result Date: 12/29/2023 CLINICAL DATA:  Left lower quadrant pain. EXAM: CT ABDOMEN AND PELVIS WITH CONTRAST TECHNIQUE: Multidetector CT imaging of the abdomen and pelvis was performed using the standard protocol following bolus administration of intravenous contrast. RADIATION DOSE REDUCTION: This exam was performed according to the departmental dose-optimization program which includes automated exposure control, adjustment of the mA and/or kV according to patient size and/or use of iterative reconstruction technique. CONTRAST:  75mL OMNIPAQUE IOHEXOL 350 MG/ML SOLN COMPARISON:  February 21, 2023 FINDINGS: Lower chest: Mild atelectatic changes are seen within the bilateral lung bases. Hepatobiliary: There is diffuse heterogeneous fatty infiltration of the liver. No focal liver abnormality is  seen. No gallstones, gallbladder wall thickening, or biliary dilatation. Pancreas: Mild diffuse pancreatic atrophy is seen without evidence of surrounding inflammatory changes or pancreatic ductal  dilatation. Spleen: The spleen is surgically absent. Adrenals/Urinary Tract: Adrenal glands are unremarkable. Kidneys are normal in size, without obstructing renal calculi or focal lesions. There is mild left-sided hydronephrosis with a 6 mm nonobstructing renal calculus seen within the dependent portion of the left renal pelvis. The urinary bladder is poorly distended and subsequently limited in evaluation. Stomach/Bowel: Stomach is within normal limits. Surgically anastomosed bowel is seen within the anterior aspect of the lower abdomen and right lower quadrant. Surgically anastomosed bowel is also noted within the distal sigmoid colon. The appendix is surgically absent. No evidence of bowel dilatation. There is mild thickening of the mid sigmoid colon (axial CT images 73 through 78, CT series 3). Vascular/Lymphatic: Aortic atherosclerosis. An inferior vena cava filter is present. No enlarged abdominal or pelvic lymph nodes. Reproductive: Status post hysterectomy. No adnexal masses. Other: No abdominal wall hernia or abnormality. No abdominopelvic ascites. Musculoskeletal: Postoperative changes are seen within the lower lumbar spine with multilevel degenerative changes noted. IMPRESSION: 1. Mild left-sided hydronephrosis with a 6 mm nonobstructing renal calculus within the dependent portion of the left renal pelvis. 2. Mild thickening of the mid sigmoid colon which may represent sequelae associated with mild colitis. 3. Hepatic steatosis. 4. Evidence of prior splenectomy, appendectomy and hysterectomy. 5. Postoperative changes within the lower lumbar spine. 6. Aortic atherosclerosis. Aortic Atherosclerosis (ICD10-I70.0). Electronically Signed   By: Aram Candela M.D.   On: 12/29/2023 23:32    Pertinent labs &  imaging results that were available during my care of the patient were reviewed by me and considered in my medical decision making (see MDM for details).  Medications Ordered in ED Medications  heparin lock flush 100 unit/mL (has no administration in time range)  sodium chloride 0.9 % bolus 1,000 mL (0 mLs Intravenous Stopped 12/29/23 2150)  iohexol (OMNIPAQUE) 350 MG/ML injection 75 mL (75 mLs Intravenous Contrast Given 12/29/23 2044)                                                                                                                                     Procedures Procedures  (including critical care time)  Medical Decision Making / ED Course    Medical Decision Making:    Craig Wisnewski is a 63 y.o. female with past medical history as below, significant for appendiceal cancer, DM, DVT on Arixtra, hypertension, IDA, pseudomyxoma peritonei, pernicious anemia, IVC filter, bilateral PE who presents to the ED with complaint of dyspnea, rectal bleeding.. The complaint involves an extensive differential diagnosis and also carries with it a high risk of complications and morbidity.  Serious etiology was considered. Ddx includes but is not limited to: In my evaluation of this patient's dyspnea my DDx includes, but is not limited to, pneumonia, pulmonary embolism, pneumothorax, pulmonary edema, metabolic acidosis, asthma, COPD, cardiac cause, anemia, anxiety, diverticular bleeding, AVM, hemorrhoidal bleeding, no distress, no hypoxia etc.    Complete initial physical exam performed,  notably the patient was in no distress, no hypoxia.    Reviewed and confirmed nursing documentation for past medical history, family history, social history.  Vital signs reviewed.   Clinical Course as of 12/29/23 2350  Fri Dec 29, 2023  1608 Hemoglobin: 13.5 Similar to prior [SG]  1628 Creatinine(!): 1.20 Mildly worsened from prior  [SG]  1628 Squamous Epithelial / HPF: 21-50 [SG]  1628 Bacteria, UA(!):  MANY Likely dirty catch [SG]  2339 Ct has resulted, results stable  [SG]    Clinical Course User Index [SG] Sloan Leiter, DO    Brief summary: 63 year old female with complex history as above here with dyspnea for past few weeks, worsened past few days and rectal bleeding which seems to have improved.  Labs reviewed as above.  She is high risk for PE, will get CT chest in addition CT abdomen pelvis given her complex abdominal cancer and surgical history   On recheck she is feeling much better.  No longer having any blood per rectum.  Hemodynamically she is stable.  No longer having any dyspnea.  Imaging has resulted and is stable.  Overall she is feeling much better.  She does have history of recurrent hemorrhoids, likely etiology of her transient rectal bleeding.  Encourage follow-up outpatient for ongoing treatment of her hemorrhoids.  CT imaging was concerning for possible colitis, she has chronic diarrhea, unchanged today.  Bleeding has subsided, labs are stable, she is feeling better.  Do not feel pt requires admission at this time  The patient improved significantly and was discharged in stable condition. Detailed discussions were had with the patient/guardian regarding current findings, and need for close f/u with PCP or on call doctor. The patient/guardian has been instructed to return immediately if the symptoms worsen in any way for re-evaluation. Patient/guardian verbalized understanding and is in agreement with current care plan. All questions answered prior to discharge.               Additional history obtained: -Additional history obtained from family -External records from outside source obtained and reviewed including: Chart review including previous notes, labs, imaging, consultation notes including  Home medications, primary care documentation, prior labs   Lab Tests: -I ordered, reviewed, and interpreted labs.   The pertinent results include:   Labs  Reviewed  COMPREHENSIVE METABOLIC PANEL - Abnormal; Notable for the following components:      Result Value   Glucose, Bld 250 (*)    Creatinine, Ser 1.20 (*)    AST 58 (*)    ALT 54 (*)    Alkaline Phosphatase 197 (*)    GFR, Estimated 52 (*)    All other components within normal limits  URINALYSIS, ROUTINE W REFLEX MICROSCOPIC - Abnormal; Notable for the following components:   Color, Urine AMBER (*)    APPearance CLOUDY (*)    Protein, ur 100 (*)    Bacteria, UA MANY (*)    All other components within normal limits  RESP PANEL BY RT-PCR (RSV, FLU A&B, COVID)  RVPGX2  CBC WITH DIFFERENTIAL/PLATELET  TROPONIN I (HIGH SENSITIVITY)    Notable for stable labs  EKG   EKG Interpretation Date/Time:    Ventricular Rate:    PR Interval:    QRS Duration:    QT Interval:    QTC Calculation:   R Axis:      Text Interpretation:           Imaging Studies ordered: I ordered imaging studies including CT  PE, CTAP I independently visualized the following imaging with scope of interpretation limited to determining acute life threatening conditions related to emergency care; findings noted above I independently visualized and interpreted imaging. I agree with the radiologist interpretation   Medicines ordered and prescription drug management: Meds ordered this encounter  Medications   sodium chloride 0.9 % bolus 1,000 mL   iohexol (OMNIPAQUE) 350 MG/ML injection 75 mL   heparin lock flush 100 unit/mL   lidocaine-hydrocortisone (ANAMANTLE) 3-1 % KIT    Sig: Place 1 Application rectally 2 (two) times daily.    Dispense:  1 kit    Refill:  0    -I have reviewed the patients home medicines and have made adjustments as needed   Consultations Obtained: na   Cardiac Monitoring: The patient was maintained on a cardiac monitor.  I personally viewed and interpreted the cardiac monitored which showed an underlying rhythm of: NSR Continuous pulse oximetry interpreted by myself,  100% on RA.    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   Chronic fatigue syndrome    Diabetes mellitus without complication (HCC)    DVT of deep femoral vein, left (HCC) 03/19/2020   Fibromyalgia    Goals of care, counseling/discussion 03/19/2020   Hypertension    Iron deficiency anemia due to chronic blood loss 05/14/2021   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   Pernicious anemia 05/14/2021   Presence of IVC filter 03/19/2020   Pulmonary embolism, bilateral (HCC) 03/19/2020   Short bowel syndrome, unspecified       Dispostion: Disposition decision including need for hospitalization was considered, and patient discharged from emergency department.    Final Clinical Impression(s) / ED Diagnoses Final diagnoses:  Hepatic steatosis  Rectal bleeding  Renal stone        Sloan Leiter, DO 12/29/23 2350

## 2023-12-29 NOTE — Telephone Encounter (Signed)
 Called patient and explained due to sxs we need to have her triaged, patient agreed to this and did transfer her to the triage nurse and now waiting on disposition

## 2023-12-29 NOTE — Telephone Encounter (Signed)
 FYI about patient care, going to ED for Blood in the stool

## 2023-12-29 NOTE — Telephone Encounter (Signed)
  Chief Complaint: rectal bleeding Symptoms: blood stool, hight BP Frequency: 10x  Disposition: [x] ED /[] Urgent Care (no appt availability in office) / [] Appointment(In office/virtual)/ []  [facility]/ [] Home Care/ [] Refused Recommended Disposition /[] [place] [facility]/ []  Follow-up with PCP Additional Notes: Pt calling for bloody stool/diarrhea  that started last night. Pt has had 10 episodes.  Pt states blood has been right red and separate from stool and has also been dark red and mixed with stool. Pt has cancer and has been dealing with bloody stool/diarrhea  for 3 years . Pt states this is "different. Pt states blood pressure has been high. Pt stated it was 176/111. Pt says balance is off, but has been that way. RN advised pt to go to ED right now. Husband is home and taking pt. RN advised pt to call back to make follow-up appt. Pt verbalized understanding of all advice.             Reason for Disposition  SEVERE rectal bleeding (large blood clots; constant or on and off bleeding)  Answer Assessment - Initial Assessment Questions 1. APPEARANCE of BLOOD: "What color is it?" "Is it passed separately, on the surface of the stool, or mixed in with the stool?"      Separate  2. AMOUNT: "How much blood was passed?"      Lots  3. FREQUENCY: "How many times has blood been passed with the stools?"      Lots  4. ONSET: "When was the blood first seen in the stools?" (Days or weeks)      3 years  5. DIARRHEA: "Is there also some diarrhea?" If Yes, ask: "How many diarrhea stools in the past 24 hours?"      10x   7. RECURRENT SYMPTOMS: "Have you had blood in your stools before?" If Yes, ask: "When was the last time?" and "What happened that time?"      Yes over 3 years  8. BLOOD THINNERS: "Do you take any blood thinners?" (e.g., Coumadin/warfarin, Pradaxa/dabigatran, aspirin)     Yes,  9. OTHER SYMPTOMS: "Do you have any other symptoms?"  (e.g., abdomen pain, vomiting,  dizziness, fever)     Blood pressure high-176/111  Protocols used: Rectal Bleeding-A-AH

## 2023-12-29 NOTE — ED Triage Notes (Signed)
 Pt here blood in stools that started 3 days ago. Last night was bright red and pt thinks its hemorrhoids. Hx of chronic PE's chronic diarrhea from cancer. Pt is on arixtra.C/O SHOB that started 3 days ago as well. C/O dizziness.

## 2023-12-29 NOTE — ED Provider Triage Note (Signed)
 Emergency Medicine Provider Triage Evaluation Note  Wendy Barber , a 63 y.o. female  was evaluated in triage.  Pt complains of blood diarrhea.  Patient has a history of chronic diarrhea but reports bright red blood in her stool since last night with lower abdominal pain.  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  Is on daily injections for anticoagulation due to history of chronic DVTs and PEs.  Review of Systems  Positive: Bright red blood per rectum, abdominal pain Negative: Fevers  Physical Exam  BP 124/85 (BP Location: Right Wrist)   Pulse 72   Temp 98.7 F (37.1 C) (Oral)   Resp 15   Ht 5\' 6"  (1.676 m)   Wt 108.9 kg   SpO2 95%   BMI 38.74 kg/m  Gen:   Awake, no distress   Resp:  Normal effort  MSK:   Moves extremities without difficulty  Other:  Lower abdominal pain  Medical Decision Making  Medically screening exam initiated at 1:21 PM.  Appropriate orders placed.  Wendy Barber was informed that the remainder of the evaluation will be completed by another provider, this initial triage assessment does not replace that evaluation, and the importance of remaining in the ED until their evaluation is complete.   Maxwell Marion, PA-C 12/29/23 1324

## 2023-12-29 NOTE — ED Notes (Signed)
Pt up to bathroom with even steady gait.

## 2023-12-29 NOTE — ED Notes (Signed)
 Patient transported to CT

## 2023-12-31 ENCOUNTER — Other Ambulatory Visit: Payer: Self-pay | Admitting: Behavioral Health

## 2023-12-31 DIAGNOSIS — F331 Major depressive disorder, recurrent, moderate: Secondary | ICD-10-CM

## 2023-12-31 DIAGNOSIS — F411 Generalized anxiety disorder: Secondary | ICD-10-CM

## 2024-01-01 ENCOUNTER — Inpatient Hospital Stay: Payer: Medicare Other

## 2024-01-01 ENCOUNTER — Other Ambulatory Visit: Payer: Medicare Other

## 2024-01-01 ENCOUNTER — Ambulatory Visit: Payer: Medicare Other | Admitting: Hematology & Oncology

## 2024-01-02 ENCOUNTER — Telehealth: Payer: Self-pay

## 2024-01-02 ENCOUNTER — Other Ambulatory Visit: Payer: Self-pay | Admitting: Hematology & Oncology

## 2024-01-02 DIAGNOSIS — H8112 Benign paroxysmal vertigo, left ear: Secondary | ICD-10-CM

## 2024-01-02 NOTE — Transitions of Care (Post Inpatient/ED Visit) (Signed)
 01/02/2024  Name: Wendy Barber MRN: [id number] DOB: 1961/04/24  Today's TOC FU Call Status: Today's TOC FU Call Status:: Successful TOC FU Call Completed TOC FU Call Complete Date: 01/02/24 Patient's Name and Date of Birth confirmed.  Transition Care Management Follow-up Telephone Call Date of Discharge: 12/29/23 Discharge Facility: [facility]) Type of Discharge: Emergency Department Reason for ED Visit: Other: (rectal bleeding) How have you been since you were released from the hospital?: Better Any questions or concerns?: No  Items Reviewed: Did you receive and understand the discharge instructions provided?: Yes Medications obtained,verified, and reconciled?: Yes (Medications Reviewed) Any new allergies since your discharge?: No Dietary orders reviewed?: Yes Do you have support at home?: Yes People in Home: spouse  Medications Reviewed Today: Medications Reviewed Today     Reviewed by Karena Addison, LPN (Licensed Practical Nurse) on 01/02/24 at 1236  Med List Status: <None>   Medication Order Taking? Sig Documenting Provider Last Dose Status Informant  amLODipine (NORVASC) 5 MG tablet [id number] No TAKE 1 TABLET(5 MG) BY MOUTH DAILY Shade Flood, MD Taking Active   buPROPion (WELLBUTRIN XL) 300 MG 24 hr tablet [id number]  Take 1 tablet (300 mg total) by mouth daily. [name], NP  Active   busPIRone (BUSPAR) 15 MG tablet [id number]  Take 1 tablet (15 mg total) by mouth 3 (three) times daily. [name], NP  Active   cabergoline (DOSTINEX) 0.5 MG tablet [id number] No Take 0.5 tablets (0.25 mg total) by mouth 2 (two) times a week. [name], [name], MD Taking Active   citalopram (CELEXA) 40 MG tablet [id number]  Take 1 tablet (40 mg total) by mouth daily. [name], NP  Active   Dextromethorphan-buPROPion ER (AUVELITY) 45-105 MG TBCR [id number] No Take one tablet by mouth for 3 days, then take two tablet daily 7-8 hours between doses Avelina Laine  A, NP Taking Active   diazepam (VALIUM) 5 MG tablet [id number] No Take 1 tab 30 minutes prior to enclosed imaging study. Shade Flood, MD Taking Active            Med Note Faustino Congress, Trudee Kuster Oct 02, 2023 10:25 AM) Only for MRI  dicyclomine (BENTYL) 20 MG tablet [id number] No TAKE 1 TABLET(20 MG) BY MOUTH THREE TIMES DAILY BEFORE MEALS [name], MD Taking Active   diphenoxylate-atropine (LOMOTIL) 2.5-0.025 MG tablet [id number]  TAKE 2 TABLETS BY MOUTH FOUR TIMES DAILY AS NEEDED FOR DIARRHEA OR LOOSE STOOLS [name], MD  Active   eszopiclone (LUNESTA) 2 MG TABS tablet [id number]  TAKE 1 TABLET(2 MG) BY MOUTH AT BEDTIME AS NEEDED FOR SLEEP [name], NP  Active   famotidine (PEPCID) 20 MG tablet [id number]  TAKE 1 TABLET(20 MG) BY MOUTH TWICE DAILY Shade Flood, MD  Active   fondaparinux (ARIXTRA) 10 MG/0.8ML SOLN injection [id number] No Inject 0.8 mLs (10 mg total) into the skin daily. [name], MD Taking Active   glimepiride (AMARYL) 4 MG tablet [id number] No Take 1 tablet (4 mg total) by mouth daily with breakfast. [name], MD Taking Active   hydrocortisone (ANUSOL-HC) 2.5 % rectal cream [id number] No PLACE RECTALLY 4 TIMES DAILY AS NEEDED FOR HEMORRHOIDS OR ANAL ITCHING Ennever, Rose Phi, MD Taking Active   HYDROmorphone (DILAUDID) 4 MG tablet [id number] No Take 1 tablet (4 mg total) by mouth every 6 (six) hours as needed for severe pain (pain score 7-10). [name], MD Taking Active  lidocaine-hydrocortisone (ANAMANTLE) 3-1 % KIT [id number]  Place 1 Application rectally 2 (two) times daily. Sloan Leiter, DO  Active   loperamide (IMODIUM) 2 MG capsule [id number] No Take 2 capsules (4 mg total) by mouth 4 (four) times daily as needed for diarrhea or loose stools. Glade Lloyd, MD Taking Active Self  meclizine (ANTIVERT) 25 MG tablet [id number]  TAKE 1 TABLET(25 MG) BY MOUTH THREE TIMES DAILY AS NEEDED FOR DIZZINESS [name], MD  Active    mirtazapine (REMERON) 7.5 MG tablet [id number]  Take 1 tablet (7.5 mg total) by mouth at bedtime. [name], NP  Active   nitrofurantoin, macrocrystal-monohydrate, (MACROBID) 100 MG capsule [id number]  Take 1 capsule (100 mg total) by mouth 2 (two) times daily. Jerene Bears, MD  Active   nystatin-triamcinolone ointment [facility]) [id number] No Apply 1 Application topically 2 (two) times daily. For up to 7 days and then STOP.  Repeat as needed for flares after at least a 2 week break [name], DO Taking Active   ondansetron (ZOFRAN) 8 MG tablet [id number] No TAKE 1 TABLET(8 MG) BY MOUTH EVERY 8 HOURS AS NEEDED FOR NAUSEA OR VOMITING [name], MD Taking Active   orphenadrine (NORFLEX) 100 MG tablet [id number] No TAKE 1 TABLET(100 MG) BY MOUTH AT BEDTIME AS NEEDED FOR MUSCLE SPASMS [name], MD Taking Active   pantoprazole (PROTONIX) 40 MG tablet [id number] No TAKE 1 TABLET BY MOUTH EVERY DAY BEFORE BREAKFAST Shade Flood, MD Taking Active   potassium chloride (KLOR-CON M) 10 MEQ tablet [id number] No Take 2 tablets (20 mEq total) by mouth daily. Shade Flood, MD Taking Active Self  pramoxine-hydrocortisone (PROCTOCREAM-HC) 1-1 % rectal cream [id number] No APPLY RECTALLY TO THE AFFECTED AREA TWICE DAILY Ennever, Rose Phi, MD Taking Active   promethazine (PHENERGAN) 12.5 MG tablet [id number] No Take 1 tablet (12.5 mg total) by mouth every 6 (six) hours as needed for nausea or vomiting. [name], MD Taking Active   propranolol (INDERAL) 20 MG tablet [id number] No TAKE 1 TABLET(20 MG) BY MOUTH TWICE DAILY Shade Flood, MD Taking Active             Home Care and Equipment/Supplies: Were Home Health Services Ordered?: NA Any new equipment or medical supplies ordered?: NA  Functional Questionnaire: Do you need assistance with bathing/showering or dressing?: No Do you need assistance with meal preparation?: No Do you need assistance with eating?: No Do you  have difficulty maintaining continence: No Do you need assistance with getting out of bed/getting out of a chair/moving?: No Do you have difficulty managing or taking your medications?: No  Follow up appointments reviewed: PCP Follow-up appointment confirmed?: Yes Date of PCP follow-up appointment?: 01/04/24 Follow-up Provider: [facility] Follow-up appointment confirmed?: NA Do you need transportation to your follow-up appointment?: No Do you understand care options if your condition(s) worsen?: Yes-patient verbalized understanding    SIGNATURE Karena Addison, LPN [facility] Nurse Health Advisor Direct Dial [phone]

## 2024-01-03 ENCOUNTER — Inpatient Hospital Stay

## 2024-01-03 ENCOUNTER — Inpatient Hospital Stay: Attending: Hematology & Oncology | Admitting: Hematology & Oncology

## 2024-01-03 ENCOUNTER — Other Ambulatory Visit: Payer: Self-pay | Admitting: Internal Medicine

## 2024-01-03 DIAGNOSIS — D510 Vitamin B12 deficiency anemia due to intrinsic factor deficiency: Secondary | ICD-10-CM | POA: Insufficient documentation

## 2024-01-03 DIAGNOSIS — C181 Malignant neoplasm of appendix: Secondary | ICD-10-CM | POA: Insufficient documentation

## 2024-01-03 DIAGNOSIS — Z79899 Other long term (current) drug therapy: Secondary | ICD-10-CM | POA: Insufficient documentation

## 2024-01-04 ENCOUNTER — Encounter: Payer: Self-pay | Admitting: Family Medicine

## 2024-01-04 ENCOUNTER — Telehealth (INDEPENDENT_AMBULATORY_CARE_PROVIDER_SITE_OTHER): Admitting: Family Medicine

## 2024-01-04 ENCOUNTER — Encounter (HOSPITAL_BASED_OUTPATIENT_CLINIC_OR_DEPARTMENT_OTHER): Payer: Medicare Other | Admitting: Obstetrics & Gynecology

## 2024-01-04 DIAGNOSIS — K625 Hemorrhage of anus and rectum: Secondary | ICD-10-CM | POA: Diagnosis not present

## 2024-01-04 DIAGNOSIS — K529 Noninfective gastroenteritis and colitis, unspecified: Secondary | ICD-10-CM

## 2024-01-04 DIAGNOSIS — Z8619 Personal history of other infectious and parasitic diseases: Secondary | ICD-10-CM | POA: Diagnosis not present

## 2024-01-04 DIAGNOSIS — K649 Unspecified hemorrhoids: Secondary | ICD-10-CM

## 2024-01-04 DIAGNOSIS — B9620 Unspecified Escherichia coli [E. coli] as the cause of diseases classified elsewhere: Secondary | ICD-10-CM

## 2024-01-04 DIAGNOSIS — R197 Diarrhea, unspecified: Secondary | ICD-10-CM | POA: Diagnosis not present

## 2024-01-04 DIAGNOSIS — N390 Urinary tract infection, site not specified: Secondary | ICD-10-CM

## 2024-01-04 NOTE — Progress Notes (Signed)
 Virtual Visit via Video Note  I connected with Wendy Barber on 01/04/24 at 12:16 PM by a video enabled telemedicine application and verified that I am speaking with the correct person using two identifiers. Initial 9 min chart review.  Patient location: home My location: office - [place].    I discussed the limitations, risks, security and privacy concerns of performing an evaluation and management service by telephone and the availability of in person appointments. I also discussed with the patient that there may be a patient responsible charge related to this service. The patient expressed understanding and agreed to proceed, consent obtained  Chief complaint:  Chief Complaint  Patient presents with   Hospitalization Follow-up    Patient notes she is doing better, notes no directions on follow up, pt notes there is still blood showing with BM noted bright red but a lot less than previous times    History of Present Illness: Wendy Barber is a 63 y.o. female  ED/hospital follow-up.  Transition of care phone call was completed on 01/02/2024 but patient is being seen virtually today.  No acute concerns identified on TOC phone call 01/02/2024.  Rectal bleeding Initial nurse triage on 12/29/2023, with bloody stool/diarrhea that started night prior with 10 episodes.  Bright red blood separate from stool, history of similar in past but this episode was different.  Elevated blood pressure noted at that time as well.  ER evaluation recommended. ER visit noted from 12/29/2023.  Presenting complaint of dyspnea, rectal bleeding.  No chest pain.  No fever or cough.  Some exertional dyspnea.  Some blood in the stool that had nearly resolved at that time with history of hemorrhoids and prior hemorrhoidal bleeding.  Transition to dark red blood. Labs noted, glucose 250, creatinine 1.20, AST, ALT slightly elevated at 58/54.  eGFR 52.  Normal CBC with hemoglobin 13.5. CT angio/chest without  pulmonary embolus. CT abdomen pelvis with contrast, mild left-sided hydronephrosis with 6 mm nonobstructing renal calculus.  Mild thickening of the mid sigmoid colon which may represent sequelae associated with mild colitis, hepatic steatosis, evidence of prior splenectomy appendectomy and hysterectomy.  Postop changes in lower lumbar spine and aortic atherosclerosis.  IV fluids in ED, on recheck she was feeling better.  No further blood per rectum per ER note.  Imaging resulted and was stable.  Recurrent hemorrhoids thought to be likely etiology of transient rectal bleeding.  Outpatient follow-up planned, and discussed the CT imaging for possible colitis with chronic diarrhea that was unchanged at that ER visit.  With bleeding subsiding and labs stable she was discharged home.  Bleeding had stopped in ER.  Slight recurrence yesterday - some bright red blood with BM yesterday evening and night before. 5 total episodes, but less than prior episodes.   No bleeding today. No new dyspnea or shortness of breath.  Applying hydrocortisone to hemorrhoids.  More cramping at times in abdomen. Abd pain intermittently at times. Has had some chronic abd pain. Some pain into back at times. No new urinary symptoms., has had recurrent UTI, requiring multiple abx treatments by her oncologist over past 2 months. Klebsiella UTI. No dysuria. Some persistent urinary frequency - last abx completed  few weeks ago.  Urine culture 12/14/23- Ecoli. Treated by GYN - Dr. Hyacinth Meeker. Plan for 7 additional days of nitrofurantoin, with repeat culture in 10 days - this has not yet been performed.   Will be seeing surgical oncologist - Dr. Lenis Noon tomorrow.  Persistent diarrhea - worse past  6-8 weeks. Followed by Dr. Myna Hidalgo for chronic abdominal issues with her prior metastatic appendiceal cancer and pseudomyxoma peritonei.  C diff colitis in past. September and December 2023.  No fever.  Appt with Dr. Lonzo Cloud next week.  Home readings  for diabetes past few days better than in ER  - under 200. Usually 140-160.     Lab Results  Component Value Date   WBC 9.9 12/29/2023   HGB 13.5 12/29/2023   HCT 41.7 12/29/2023   MCV 96.5 12/29/2023   PLT 238 12/29/2023   Lab Results  Component Value Date   HGBA1C 8.9 (H) 12/04/2023         Patient Active Problem List   Diagnosis Date Noted   Hyperprolactinemia (HCC) 06/23/2023   Pituitary macroadenoma (HCC) 06/23/2023   Pituitary adenoma (HCC) 04/17/2023   Pulmonary emboli (HCC) 12/08/2022   Pulmonary embolism (HCC) 12/07/2022   Thyroid nodule 12/07/2022   Enteritis 07/23/2022   Fall at home, initial encounter 07/23/2022   AMS (altered mental status) 07/23/2022   Hemorrhoids 06/25/2022   Occasional tremors: Essential tremors 06/23/2022   Hypomagnesemia 06/22/2022   Iron deficiency anemia    Gastritis 06/18/2022   Depression 06/18/2022   UTI (urinary tract infection) 06/16/2022   DVT, bilateral lower limbs (HCC) 06/14/2022   Symptomatic bradycardia 06/13/2022   History of excision of intestinal structure 06/13/2022   CAD (coronary artery disease) 06/13/2022   Acute lower GI bleeding 05/04/2022   AKI (acute kidney injury) (HCC) 05/04/2022   History of deep vein thrombosis (DVT) of lower extremity 05/04/2022   History of pulmonary embolus (PE) 05/04/2022   Anxiety 05/04/2022   Pernicious anemia 05/14/2021   Iron deficiency anemia due to chronic blood loss 05/14/2021   Abdominal wall abscess    Surgical wound infection 01/16/2021   C. 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   DVT of deep femoral vein, left (HCC) 03/19/2020   Pulmonary embolism, bilateral (HCC) 03/19/2020   Presence of IVC filter 03/19/2020   Hepatic  encephalopathy (HCC) 11/22/2019   Confusion 11/21/2019   Autoimmune hepatitis (HCC) 11/21/2019   Uncontrolled hypertension 11/21/2019   DM2 (diabetes mellitus, type 2) (HCC) 11/21/2019   Closed right ankle fracture 08/27/2019   Acute deep vein thrombosis (DVT) of femoral vein of left lower extremity (HCC) 08/03/2018   History of colon cancer 08/03/2018   History of partial colectomy 08/03/2018   Spinal stenosis 08/03/2018   Morbid obesity (HCC) 08/03/2018   Cerebral meningioma (HCC) 01/05/2018   Arthritis of right shoulder region 02/18/2017   History of renal calculi 02/14/2016   Midline low back pain 02/14/2016   Class 3 obesity 01/07/2016   Migraine with aura and without status migrainosus, not intractable 01/07/2016   Chronic fatigue syndrome 09/23/2013   GERD (gastroesophageal reflux disease) 09/23/2013   History of hepatitis A 09/23/2013   Rheumatoid arthritis involving multiple sites (HCC) 09/23/2013   Past Medical History:  Diagnosis Date   Arthritis    Back pain    Cancer (HCC)    pseudomyxoma peritonei   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   Chronic fatigue syndrome    Diabetes mellitus without complication (HCC)    DVT of deep femoral vein, left (HCC) 03/19/2020   Fibromyalgia    Goals of care,  counseling/discussion 03/19/2020   Hypertension    Iron deficiency anemia due to chronic blood loss 05/14/2021   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   Pernicious anemia 05/14/2021   Presence of IVC filter 03/19/2020   Pulmonary embolism, bilateral (HCC) 03/19/2020   Short bowel syndrome, unspecified    Past Surgical History:  Procedure Laterality Date   ABDOMINAL SURGERY  2005   cytoreductive surgery with splenectomy, [facility]   ABDOMINAL SURGERY  2022   ACHILLES TENDON REPAIR     ARTHROPLASTY     CARPAL TUNNEL RELEASE     CHOLECYSTECTOMY  07/1994   IR CATHETER TUBE CHANGE  02/02/2021   IR CATHETER TUBE CHANGE   02/25/2021   IR IMAGING GUIDED PORT INSERTION  06/20/2022   IR RADIOLOGIST EVAL & MGMT  02/24/2021   IR RADIOLOGIST EVAL & MGMT  03/10/2021   IR RADIOLOGIST EVAL & MGMT  06/22/2022   IR THROMBECT VENO MECH MOD SED  06/20/2022   IR US GUIDE BX ASP/DRAIN  11/25/2019   IR US GUIDE VASC ACCESS LEFT  06/20/2022   IR US GUIDE VASC ACCESS RIGHT  06/20/2022   IR US GUIDE VASC ACCESS RIGHT  06/20/2022   IR VENO/EXT/BI  06/20/2022   IR VENOCAVAGRAM IVC  06/20/2022   JOINT REPLACEMENT Bilateral    knees   KNEE ARTHROSCOPY     ORIF ANKLE FRACTURE Right 08/27/2019   Procedure: OPEN REDUCTION INTERNAL FIXATION RIGHT ANKLE FRACTURE;  Surgeon: Sheral Apley, MD;  Location: [facility];  Service: Orthopedics;  Laterality: Right;   perineorrophy     TONGUE BIOPSY     TOTAL ABDOMINAL HYSTERECTOMY Bilateral 2004   with BSO and appendectomy   Allergies  Allergen Reactions   Ativan [Lorazepam] Swelling and Other (See Comments)    Face & Throat Swelling  Note: tolerates midazolam fine   Corticosteroids Other (See Comments)    "Psychotic behavior"    Penicillins Shortness Of Breath and Other (See Comments)    Irregular and rapid Heart Rate, too    Alprazolam Hives and Other (See Comments)    Hard to arouse, unresponsiveness also   Erythromycin Nausea And Vomiting        Gabapentin Other (See Comments)    Made the patient feel depressed   Prednisolone Anxiety   Prednisone Anxiety and Other (See Comments)    "Anxiety & Nervous Breakdown"   Savella  [Milnacipran] Other (See Comments)    Reaction not noted   Prior to Admission medications   Medication Sig Start Date End Date Taking? Authorizing Provider  amLODipine (NORVASC) 5 MG tablet TAKE 1 TABLET(5 MG) BY MOUTH DAILY 11/22/23  Yes Shade Flood, MD  buPROPion (WELLBUTRIN XL) 300 MG 24 hr tablet Take 1 tablet (300 mg total) by mouth daily. 12/19/23  Yes White, Arlys John A, NP  busPIRone (BUSPAR) 15 MG tablet Take 1 tablet (15 mg total) by mouth  3 (three) times daily. 12/19/23  Yes White, Arlys John A, NP  cabergoline (DOSTINEX) 0.5 MG tablet TAKE 1 TABLET(0.5 MG) BY MOUTH 2 TIMES A WEEK 01/03/24  Yes [name], [name], MD  citalopram (CELEXA) 40 MG tablet Take 1 tablet (40 mg total) by mouth daily. 12/19/23  Yes [name], NP  Dextromethorphan-buPROPion ER (AUVELITY) 45-105 MG TBCR Take one tablet by mouth for 3 days, then take two tablet daily 7-8 hours between doses 03/07/23  Yes White, Brian A, NP  diazepam (VALIUM) 5 MG tablet Take 1 tab 30 minutes prior to enclosed  imaging study. 06/01/23  Yes Shade Flood, MD  dicyclomine (BENTYL) 20 MG tablet TAKE 1 TABLET(20 MG) BY MOUTH THREE TIMES DAILY BEFORE MEALS 07/26/23  Yes [name], MD  diphenoxylate-atropine (LOMOTIL) 2.5-0.025 MG tablet TAKE 2 TABLETS BY MOUTH FOUR TIMES DAILY AS NEEDED FOR DIARRHEA OR LOOSE STOOLS 12/22/23  Yes Ennever, Rose Phi, MD  eszopiclone (LUNESTA) 2 MG TABS tablet TAKE 1 TABLET(2 MG) BY MOUTH AT BEDTIME AS NEEDED FOR SLEEP 12/19/23  Yes [name], NP  famotidine (PEPCID) 20 MG tablet TAKE 1 TABLET(20 MG) BY MOUTH TWICE DAILY 12/22/23  Yes Shade Flood, MD  fondaparinux (ARIXTRA) 10 MG/0.8ML SOLN injection Inject 0.8 mLs (10 mg total) into the skin daily. 03/15/23  Yes [name], MD  glimepiride (AMARYL) 4 MG tablet Take 1 tablet (4 mg total) by mouth daily with breakfast. 11/06/23  Yes Ennever, Rose Phi, MD  hydrocortisone (ANUSOL-HC) 2.5 % rectal cream PLACE RECTALLY 4 TIMES DAILY AS NEEDED FOR HEMORRHOIDS OR ANAL ITCHING 10/02/23  Yes Ennever, Rose Phi, MD  HYDROmorphone (DILAUDID) 4 MG tablet Take 1 tablet (4 mg total) by mouth every 6 (six) hours as needed for severe pain (pain score 7-10). 12/11/23  Yes Ennever, Rose Phi, MD  lidocaine-hydrocortisone (ANAMANTLE) 3-1 % KIT Place 1 Application rectally 2 (two) times daily. 12/29/23  Yes Tanda Rockers A, DO  loperamide (IMODIUM) 2 MG capsule Take 2 capsules (4 mg total) by mouth 4 (four) times  daily as needed for diarrhea or loose stools. 05/08/22  Yes Glade Lloyd, MD  meclizine (ANTIVERT) 25 MG tablet TAKE 1 TABLET(25 MG) BY MOUTH THREE TIMES DAILY AS NEEDED FOR DIZZINESS 01/02/24  Yes Ennever, Rose Phi, MD  mirtazapine (REMERON) 7.5 MG tablet Take 1 tablet (7.5 mg total) by mouth at bedtime. 12/19/23  Yes White, Watt Climes, NP  nitrofurantoin, macrocrystal-monohydrate, (MACROBID) 100 MG capsule Take 1 capsule (100 mg total) by mouth 2 (two) times daily. 12/18/23  Yes Jerene Bears, MD  nystatin-triamcinolone ointment [facility]) Apply 1 Application topically 2 (two) times daily. For up to 7 days and then STOP.  Repeat as needed for flares after at least a 2 week break 01/30/23  Yes Langston Reusing N, DO  ondansetron (ZOFRAN) 8 MG tablet TAKE 1 TABLET(8 MG) BY MOUTH EVERY 8 HOURS AS NEEDED FOR NAUSEA OR VOMITING 12/09/23  Yes [name], MD  orphenadrine (NORFLEX) 100 MG tablet TAKE 1 TABLET(100 MG) BY MOUTH AT BEDTIME AS NEEDED FOR MUSCLE SPASMS 11/07/23  Yes [name], MD  pantoprazole (PROTONIX) 40 MG tablet TAKE 1 TABLET BY MOUTH EVERY DAY BEFORE BREAKFAST 12/04/23  Yes Shade Flood, MD  potassium chloride (KLOR-CON M) 10 MEQ tablet Take 2 tablets (20 mEq total) by mouth daily. 07/07/22  Yes Shade Flood, MD  pramoxine-hydrocortisone (PROCTOCREAM-HC) 1-1 % rectal cream APPLY RECTALLY TO THE AFFECTED AREA TWICE DAILY 10/24/23  Yes Ennever, Rose Phi, MD  promethazine (PHENERGAN) 12.5 MG tablet Take 1 tablet (12.5 mg total) by mouth every 6 (six) hours as needed for nausea or vomiting. 09/05/23  Yes [name], MD  propranolol (INDERAL) 20 MG tablet TAKE 1 TABLET(20 MG) BY MOUTH TWICE DAILY 10/06/23  Yes Shade Flood, MD   Social History   Socioeconomic History   Marital status: Married    Spouse name: Not on file   Number of children: 3   Years of education: Not on file   Highest education level: Some college, no  degree  Occupational History   Occupation:  disability  Tobacco Use   Smoking status: Never   Smokeless tobacco: Never  Vaping Use   Vaping status: Never Used  Substance and Sexual Activity   Alcohol use: No   Drug use: Never   Sexual activity: Yes    Birth control/protection: None    Comment: hysterectomy  Other Topics Concern   Not on file  Social History Narrative   Right handed   One story home   Drinks occasional caffeine   Social Drivers of Health   Financial Resource Strain: Patient Declined (10/16/2023)   Overall Financial Resource Strain (CARDIA)    Difficulty of Paying Living Expenses: Patient declined  Food Insecurity: No Food Insecurity (10/16/2023)   Hunger Vital Sign    Worried About Running Out of Food in the Last Year: Never true    Ran Out of Food in the Last Year: Never true  Transportation Needs: No Transportation Needs (10/16/2023)   PRAPARE - Administrator, Civil Service (Medical): No    Lack of Transportation (Non-Medical): No  Physical Activity: Inactive (10/16/2023)   Exercise Vital Sign    Days of Exercise per Week: 0 days    Minutes of Exercise per Session: 20 min  Stress: Stress Concern Present (10/16/2023)   [employer] of Occupational Health - Occupational Stress Questionnaire    Feeling of Stress : Very much  Social Connections: Socially Integrated (10/16/2023)   Social Connection and Isolation Panel [NHANES]    Frequency of Communication with Friends and Family: More than three times a week    Frequency of Social Gatherings with Friends and Family: Once a week    Attends Religious Services: More than 4 times per year    Active Member of [employer] or Organizations: No    Attends Engineer, structural: More than 4 times per year    Marital Status: Married  Catering manager Violence: Not At Risk (03/09/2023)   Humiliation, Afraid, Rape, and Kick questionnaire    Fear of Current or Ex-Partner: No    Emotionally Abused: No    Physically Abused: No    Sexually  Abused: No    Observations/Objective: There were no vitals filed for this visit. Nontoxic appearance on video, speaking in full sentences without respiratory distress.  No audible wheeze, stridor, appropriate responses.  Does not appear dyspneic.  All questions answered with understanding of plan expressed.  Assessment and Plan: BRBPR (bright red blood per rectum) - Plan: CBC Diarrhea, unspecified type - Plan: Clostridium Difficile by PCR Hemorrhoids, unspecified hemorrhoid type History of Clostridium difficile infection - Plan: Clostridium Difficile by PCR Colitis - Plan: Clostridium Difficile by PCR  -Bright red blood per rectum/rectal bleeding likely due to hemorrhoids, improved with minimal symptoms recurrent recently.  ER precautions given if worsening bleeding or not improving.  Repeat CBC today or tomorrow for stability with ER precautions given.  She will be seen surgical oncologist tomorrow, she would like to discuss treatment for hemorrhoids at that time but I am happy to refer her to general surgeon or gastroenterology here locally if needed.  Recurrent diarrhea likely contributing.  -Difficult situation with chronic abdominal issues as above, recurrent diarrhea but has had some possible increase in diarrhea and change in abdominal pain.  Mild colitis noted on recent CT and she does have a remote history of C. difficile colitis, recent antibiotics for urinary tract infection.  Will check C. difficile testing.  -Has follow-up with surgical  oncologist tomorrow.  Likely will have evaluation, exam at that time, however ER/urgent care precautions given if worsening symptoms.    E. coli urinary tract infection - Plan: Urine Culture  -Recurrent infection, reported history of Klebsiella infection, most recent culture with E. coli from gynecology with planned follow-up culture after treatment.  Some possible residual symptoms of frequency but no dysuria.  Check urine culture and treat based on  results.  Hold on further antibiotics at this time until culture proven infection given concerns for C. difficile and diarrhea as above.  Discussed hyperglycemia in the ER, improved at home, follow-up with endocrinology as planned  Follow Up Instructions:    I discussed the assessment and treatment plan with the patient. The patient was provided an opportunity to ask questions and all were answered. The patient agreed with the plan and demonstrated an understanding of the instructions.   The patient was advised to call back or seek an in-person evaluation if the symptoms worsen or if the condition fails to improve as anticipated.   Shade Flood, MD

## 2024-01-04 NOTE — Patient Instructions (Addendum)
 For rectal bleeding - repeat blood count today or tomorrow, I would consider referral to specialist (surgeon or gastroenterology)  for hemorrhoid treatment, but can discuss with specialist tomorrow first and let me know if a referral needed.  With worsening diarrhea, I would recommend C diff testing with recent urinary infection antibiotics. I will order this test.  I have ordered a repeat urine culture to determine if any continued infection.  Keep follow up with diabetes specialist as planned.   Return to the clinic or go to the nearest emergency room if any of your symptoms worsen or new symptoms occur.  [facility] or xray: Walk in 8:30-4:30 during weekdays, no appointment needed [address]  [place], [place] [address]

## 2024-01-08 ENCOUNTER — Other Ambulatory Visit: Payer: Self-pay | Admitting: Family Medicine

## 2024-01-08 DIAGNOSIS — R12 Heartburn: Secondary | ICD-10-CM

## 2024-01-09 ENCOUNTER — Other Ambulatory Visit: Payer: Self-pay | Admitting: Hematology & Oncology

## 2024-01-10 ENCOUNTER — Inpatient Hospital Stay

## 2024-01-10 VITALS — BP 155/93 | HR 67 | Temp 98.0°F | Resp 18

## 2024-01-10 DIAGNOSIS — R799 Abnormal finding of blood chemistry, unspecified: Secondary | ICD-10-CM

## 2024-01-10 DIAGNOSIS — Z95828 Presence of other vascular implants and grafts: Secondary | ICD-10-CM

## 2024-01-10 DIAGNOSIS — C181 Malignant neoplasm of appendix: Secondary | ICD-10-CM | POA: Diagnosis present

## 2024-01-10 DIAGNOSIS — D510 Vitamin B12 deficiency anemia due to intrinsic factor deficiency: Secondary | ICD-10-CM | POA: Diagnosis present

## 2024-01-10 DIAGNOSIS — N3000 Acute cystitis without hematuria: Secondary | ICD-10-CM

## 2024-01-10 DIAGNOSIS — Z79899 Other long term (current) drug therapy: Secondary | ICD-10-CM | POA: Diagnosis not present

## 2024-01-10 LAB — CBC WITH DIFFERENTIAL (CANCER CENTER ONLY)
Abs Immature Granulocytes: 0.03 10*3/uL (ref 0.00–0.07)
Basophils Absolute: 0.1 10*3/uL (ref 0.0–0.1)
Basophils Relative: 1 %
Eosinophils Absolute: 0.2 10*3/uL (ref 0.0–0.5)
Eosinophils Relative: 3 %
HCT: 37.6 % (ref 36.0–46.0)
Hemoglobin: 12.4 g/dL (ref 12.0–15.0)
Immature Granulocytes: 0 %
Lymphocytes Relative: 36 %
Lymphs Abs: 2.8 10*3/uL (ref 0.7–4.0)
MCH: 31.2 pg (ref 26.0–34.0)
MCHC: 33 g/dL (ref 30.0–36.0)
MCV: 94.7 fL (ref 80.0–100.0)
Monocytes Absolute: 0.6 10*3/uL (ref 0.1–1.0)
Monocytes Relative: 8 %
Neutro Abs: 4.1 10*3/uL (ref 1.7–7.7)
Neutrophils Relative %: 52 %
Platelet Count: 228 10*3/uL (ref 150–400)
RBC: 3.97 MIL/uL (ref 3.87–5.11)
RDW: 13.9 % (ref 11.5–15.5)
WBC Count: 7.9 10*3/uL (ref 4.0–10.5)
nRBC: 0 % (ref 0.0–0.2)

## 2024-01-10 LAB — CMP (CANCER CENTER ONLY)
ALT: 26 U/L (ref 0–44)
AST: 27 U/L (ref 15–41)
Albumin: 3.8 g/dL (ref 3.5–5.0)
Alkaline Phosphatase: 178 U/L — ABNORMAL HIGH (ref 38–126)
Anion gap: 6 (ref 5–15)
BUN: 17 mg/dL (ref 8–23)
CO2: 26 mmol/L (ref 22–32)
Calcium: 8.7 mg/dL — ABNORMAL LOW (ref 8.9–10.3)
Chloride: 109 mmol/L (ref 98–111)
Creatinine: 0.98 mg/dL (ref 0.44–1.00)
GFR, Estimated: >60 mL/min (ref >60)
Glucose, Bld: 206 mg/dL — ABNORMAL HIGH (ref 70–99)
Potassium: 3.5 mmol/L (ref 3.5–5.1)
Sodium: 141 mmol/L (ref 135–145)
Total Bilirubin: 0.5 mg/dL (ref 0.0–1.2)
Total Protein: 6.9 g/dL (ref 6.5–8.1)

## 2024-01-10 LAB — URINALYSIS, COMPLETE (UACMP) WITH MICROSCOPIC
Bilirubin Urine: NEGATIVE
Glucose, UA: 100 mg/dL — AB
Ketones, ur: NEGATIVE mg/dL
Leukocytes,Ua: NEGATIVE
Nitrite: NEGATIVE
Protein, ur: 30 mg/dL — AB
Specific Gravity, Urine: >=1.03 (ref 1.005–1.030)
pH: 6 (ref 5.0–8.0)

## 2024-01-10 LAB — FERRITIN: Ferritin: 67 ng/mL (ref 11–307)

## 2024-01-10 LAB — LACTATE DEHYDROGENASE: LDH: 177 U/L (ref 98–192)

## 2024-01-10 MED ORDER — LANREOTIDE ACETATE 120 MG/0.5ML ~~LOC~~ SOLN
120.0000 mg | Freq: Once | SUBCUTANEOUS | Status: AC
  Administered 2024-01-10: 120 mg via SUBCUTANEOUS
  Filled 2024-01-10: qty 120

## 2024-01-10 MED ORDER — HEPARIN SOD (PORK) LOCK FLUSH 100 UNIT/ML IV SOLN
500.0000 [IU] | Freq: Once | INTRAVENOUS | Status: AC
  Administered 2024-01-10: 500 [IU] via INTRAVENOUS

## 2024-01-10 MED ORDER — SODIUM CHLORIDE 0.9% FLUSH
10.0000 mL | Freq: Once | INTRAVENOUS | Status: AC
  Administered 2024-01-10: 10 mL via INTRAVENOUS

## 2024-01-11 LAB — IRON AND IRON BINDING CAPACITY (CC-WL,HP ONLY)
Iron: 87 ug/dL (ref 28–170)
Saturation Ratios: 25 % (ref 10.4–31.8)
TIBC: 350 ug/dL (ref 250–450)
UIBC: 263 ug/dL (ref 148–442)

## 2024-01-11 LAB — CHROMOGRANIN A: Chromogranin A (ng/mL): 37.5 ng/mL (ref 0.0–101.8)

## 2024-01-12 ENCOUNTER — Other Ambulatory Visit: Payer: Self-pay | Admitting: Family Medicine

## 2024-01-12 DIAGNOSIS — R12 Heartburn: Secondary | ICD-10-CM

## 2024-01-12 MED ORDER — FAMOTIDINE 20 MG PO TABS: ORAL_TABLET | ORAL | 0 refills | Status: DC

## 2024-01-13 ENCOUNTER — Other Ambulatory Visit: Payer: Self-pay | Admitting: Hematology & Oncology

## 2024-01-13 DIAGNOSIS — R110 Nausea: Secondary | ICD-10-CM

## 2024-01-14 ENCOUNTER — Other Ambulatory Visit: Payer: Self-pay | Admitting: Hematology & Oncology

## 2024-01-15 ENCOUNTER — Encounter: Payer: Self-pay | Admitting: Hematology & Oncology

## 2024-01-16 ENCOUNTER — Other Ambulatory Visit: Payer: Self-pay | Admitting: Hematology & Oncology

## 2024-01-17 ENCOUNTER — Telehealth (HOSPITAL_BASED_OUTPATIENT_CLINIC_OR_DEPARTMENT_OTHER): Payer: Self-pay | Admitting: *Deleted

## 2024-01-17 NOTE — Telephone Encounter (Signed)
 Called pt to advise that provider could order a CT with rectal contrast for evaluation of air passing through vagina, to rule out fistula. Advised that with her frequent stools, she may not be able to hold the contrast in place. Pt is uncertain that she would be able to do that. She saw Dr. Lenis Noon on 3/14 but did not mention this issue to him. She also reports to me that her stool has been oily and foamy more days than not. She is requesting a recommendation to GI provider as most of her issues seem to be GI related. Pt has appt with Dr. Lenis Noon in 6 months and is considering a colostomy which is something that she has requested in the past. Pt will let us know if she chooses to proceed with CT. She still has the report of air in vagina but feels that maybe it is not as frequent.

## 2024-01-17 NOTE — Telephone Encounter (Signed)
-----   Message from Jerene Bears sent at 01/15/2024  1:23 AM EDT ----- Regarding: complicated pt with hx of appendiceal cancer Wendy Batten, I saw this pt in February and referred her to [facility] to surgical oncologist (Dr. Lenis Noon) who specializes in her type of cancer.  She was seen 3/14.  He started some medication for hopefully slowing down her bowels and he wanted to get a flex sig done.  She came concerned about passing air through the vagina (fistula) but I didn't see anything worrisome on exam.  Just wondering about this symptom and if we need to do some additional investigation.  Could so MRI or CT with rectal contrast to r/o fistula.  Not sure she would tolerated the rectal contrast or be able to hold it in place but this is how we would need to look.  Thanks.  MSM

## 2024-01-19 ENCOUNTER — Ambulatory Visit (INDEPENDENT_AMBULATORY_CARE_PROVIDER_SITE_OTHER): Admitting: Internal Medicine

## 2024-01-19 ENCOUNTER — Encounter: Payer: Self-pay | Admitting: Internal Medicine

## 2024-01-19 VITALS — BP 124/82 | HR 74 | Ht 66.0 in | Wt 242.0 lb

## 2024-01-19 DIAGNOSIS — R7989 Other specified abnormal findings of blood chemistry: Secondary | ICD-10-CM | POA: Diagnosis not present

## 2024-01-19 DIAGNOSIS — E1165 Type 2 diabetes mellitus with hyperglycemia: Secondary | ICD-10-CM

## 2024-01-19 DIAGNOSIS — E221 Hyperprolactinemia: Secondary | ICD-10-CM | POA: Diagnosis not present

## 2024-01-19 DIAGNOSIS — E041 Nontoxic single thyroid nodule: Secondary | ICD-10-CM

## 2024-01-19 DIAGNOSIS — Z794 Long term (current) use of insulin: Secondary | ICD-10-CM

## 2024-01-19 DIAGNOSIS — D352 Benign neoplasm of pituitary gland: Secondary | ICD-10-CM

## 2024-01-19 LAB — POCT GLUCOSE (DEVICE FOR HOME USE): POC Glucose: 226 mg/dL — AB (ref 70–99)

## 2024-01-19 MED ORDER — GLIPIZIDE 5 MG PO TABS: 5.0000 mg | ORAL_TABLET | Freq: Two times a day (BID) | ORAL | 3 refills | Status: DC

## 2024-01-19 NOTE — Patient Instructions (Addendum)
 Stop glimepiride Start glipizide 5 mg, 1 tablet before breakfast 1 tablet before supper    24-Hour Urine Collection  You will be collecting your urine for a 24-hour period of time. Your timer starts with your first urine of the morning (For example - If you first pee at 9AM, your timer will start at 9AM) Throw away your first urine of the morning Collect your urine every time you pee for the next 24 hours STOP your urine collection 24 hours after you started the collection (For example - You would stop at 9AM the day after you started)    HOW TO TREAT LOW BLOOD SUGARS (Blood sugar LESS THAN 70 MG/DL) Please follow the RULE OF 15 for the treatment of hypoglycemia treatment (when your (blood sugars are less than 70 mg/dL)   STEP 1: Take 15 grams of carbohydrates when your blood sugar is low, which includes:  3-4 GLUCOSE TABS  OR 3-4 OZ OF JUICE OR REGULAR SODA OR ONE TUBE OF GLUCOSE GEL    STEP 2: RECHECK blood sugar in 15 MINUTES STEP 3: If your blood sugar is still low at the 15 minute recheck --> then, go back to STEP 1 and treat AGAIN with another 15 grams of carbohydrates.

## 2024-01-19 NOTE — Progress Notes (Signed)
 Name: Wendy Barber  MRN/ DOB: [id number], 01/28/1961    Age/ Sex: 63 y.o., female    PCP: Shade Flood, MD   Reason for Endocrinology Evaluation: Pituitary adenoma     Date of Initial Endocrinology Evaluation: 04/17/2023    HPI: Wendy Barber is a 63 y.o. female with a past medical history of HTN, CAD, Hx DVT/PE, Cancer of appendic with mets , autoimmune hepatitis and DM. The patient presented for initial endocrinology clinic visit on 04/17/2023 for consultative assistance with her pituitary adenoma.   During evaluation by neurology for vertigo and cerebral meningioma she was noted to have a pituitary adenoma on brain MRI 2023    Patient follows with oncology for history of metastatic appendiceal cancer, s/p surgery 2022    Patient had an incidental finding of thyroid nodule on CT scan which prompted thyroid ultrasound 11/2022, revealing a right inferior 2.3 cm nodule meeting follow-up criteria.    S/P hysterectomy 2004 , no HRT     Pituitary labs showed elevated ACTH at 76.3 pg/mL ( 7.2-63.3) , 24 hr urine cortisol was normal , she also had elevated Prolactin 49.9 ng/dL ),   IGF -1 was normal as well as TFT's.   Started cabergoline 05/2023 with a prolactin of 49.9 NG/mL   DM HISTORY: She has been diagnosed with DM many years ago, she was on insulin at some point ( basal/prandial )which was discontinued in 2021, prior to cancer surgery.   Her BG's have remained stable until 2024 when she was noted with hypoglycemia again.   She was started on glimepiride by her oncologist in 2024 I switch from glimepiride to glipizide 12/2023   SUBJECTIVE:      Today (06/23/23):  Wendy Barber is here for follow-up on pituitary macroadenoma and MNG.   She has been following up at [facility] for malignant pseudomyxoma peritonei  Was evaluated by GYN due to concerns about fistula, no evidence of fistula was noted  She was evaluated by [facility] general surgery on  01/05/2024 for history of appendiceal cancer and large-volume diarrhea.  She was started on cholestyramine, and flexible sigmoidoscopy was recommended    She continues on  lanreotide injections -monthly  she is being treated for a presumptive diagnosis of carcinoid.  She presented to the ED on 12/29/2023 for rectal bleed   No recent Glucocorticoids  Has noted occasional worsening headaches  Had cataract sx January, February , 2025 vision is somewhat better  Has occasional  LE edema  Denies palpitations  Denies local neck swelling     cabergoline 0.5 mg, 1 a tablet twice weekly ( Sunday and Wednesday)     HISTORY:  Past Medical History:  Past Medical History:  Diagnosis Date   Arthritis    Back pain    Cancer [facility])    pseudomyxoma peritonei   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   Chronic fatigue syndrome    Diabetes mellitus without complication (HCC)    DVT of deep femoral vein, left (HCC) 03/19/2020   Fibromyalgia    Goals of care, counseling/discussion 03/19/2020   Hypertension    Iron deficiency anemia due to chronic blood loss 05/14/2021   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   Pernicious anemia 05/14/2021   Presence of IVC filter 03/19/2020   Pulmonary embolism, bilateral (HCC) 03/19/2020   Short bowel syndrome, unspecified    Past Surgical History:  Past Surgical History:  Procedure Laterality Date   ABDOMINAL  SURGERY  2005   cytoreductive surgery with splenectomy, [facility]   ABDOMINAL SURGERY  2022   ACHILLES TENDON REPAIR     ARTHROPLASTY     CARPAL TUNNEL RELEASE     CHOLECYSTECTOMY  07/1994   IR CATHETER TUBE CHANGE  02/02/2021   IR CATHETER TUBE CHANGE  02/25/2021   IR IMAGING GUIDED PORT INSERTION  06/20/2022   IR RADIOLOGIST EVAL & MGMT  02/24/2021   IR RADIOLOGIST EVAL & MGMT  03/10/2021   IR RADIOLOGIST EVAL & MGMT  06/22/2022   IR THROMBECT VENO MECH MOD SED  06/20/2022   IR US GUIDE BX  ASP/DRAIN  11/25/2019   IR US GUIDE VASC ACCESS LEFT  06/20/2022   IR US GUIDE VASC ACCESS RIGHT  06/20/2022   IR US GUIDE VASC ACCESS RIGHT  06/20/2022   IR VENO/EXT/BI  06/20/2022   IR VENOCAVAGRAM IVC  06/20/2022   JOINT REPLACEMENT Bilateral    knees   KNEE ARTHROSCOPY     ORIF ANKLE FRACTURE Right 08/27/2019   Procedure: OPEN REDUCTION INTERNAL FIXATION RIGHT ANKLE FRACTURE;  Surgeon: Sheral Apley, MD;  Location: [facility];  Service: Orthopedics;  Laterality: Right;   perineorrophy     TONGUE BIOPSY     TOTAL ABDOMINAL HYSTERECTOMY Bilateral 2004   with BSO and appendectomy    Social History:  reports that she has never smoked. She has never used smokeless tobacco. She reports that she does not drink alcohol and does not use drugs. Family History: family history includes Alcohol abuse in her brother; Depression in her mother; Drug abuse in her brother and sister; Suicidality in her sister.   HOME MEDICATIONS: Allergies as of 01/19/2024       Reactions   Ativan [lorazepam] Swelling, Other (See Comments)   Face & Throat Swelling  Note: tolerates midazolam fine   Corticosteroids Other (See Comments)   "Psychotic behavior"   Penicillins Shortness Of Breath, Other (See Comments)   Irregular and rapid Heart Rate, too   Alprazolam Hives, Other (See Comments)   Hard to arouse, unresponsiveness also   Erythromycin Nausea And Vomiting      Gabapentin Other (See Comments)   Made the patient feel depressed   Prednisolone Anxiety   Prednisone Anxiety, Other (See Comments)   "Anxiety & Nervous Breakdown"   Savella  [milnacipran] Other (See Comments)   Reaction not noted        Medication List        Accurate as of January 19, 2024 11:57 AM. If you have any questions, ask your nurse or doctor.          amLODipine 5 MG tablet Commonly known as: NORVASC TAKE 1 TABLET(5 MG) BY MOUTH DAILY   Auvelity 45-105 MG Tbcr Generic drug: Dextromethorphan-buPROPion ER Take one  tablet by mouth for 3 days, then take two tablet daily 7-8 hours between doses   buPROPion 300 MG 24 hr tablet Commonly known as: WELLBUTRIN XL Take 1 tablet (300 mg total) by mouth daily.   busPIRone 15 MG tablet Commonly known as: BUSPAR Take 1 tablet (15 mg total) by mouth 3 (three) times daily.   cabergoline 0.5 MG tablet Commonly known as: DOSTINEX TAKE 1 TABLET(0.5 MG) BY MOUTH 2 TIMES A WEEK   citalopram 40 MG tablet Commonly known as: CELEXA Take 1 tablet (40 mg total) by mouth daily.   diazepam 5 MG tablet Commonly known as: VALIUM Take 1 tab 30 minutes prior to enclosed imaging study.  dicyclomine 20 MG tablet Commonly known as: BENTYL TAKE 1 TABLET(20 MG) BY MOUTH THREE TIMES DAILY BEFORE MEALS   diphenoxylate-atropine 2.5-0.025 MG tablet Commonly known as: LOMOTIL TAKE 2 TABLETS BY MOUTH FOUR TIMES DAILY AS NEEDED FOR DIARRHEA OR LOOSE STOOLS   eszopiclone 2 MG Tabs tablet Commonly known as: LUNESTA TAKE 1 TABLET(2 MG) BY MOUTH AT BEDTIME AS NEEDED FOR SLEEP   famotidine 20 MG tablet Commonly known as: PEPCID TAKE 1 TABLET(20 MG) BY MOUTH TWICE DAILY   fondaparinux 10 MG/0.8ML Soln injection Commonly known as: Arixtra Inject 0.8 mLs (10 mg total) into the skin daily.   glimepiride 4 MG tablet Commonly known as: AMARYL TAKE 1 TABLET(4 MG) BY MOUTH DAILY WITH BREAKFAST   hydrocortisone 2.5 % rectal cream Commonly known as: ANUSOL-HC PLACE RECTALLY 4 TIMES DAILY AS NEEDED FOR HEMORRHOIDS OR ANAL ITCHING   HYDROmorphone 4 MG tablet Commonly known as: Dilaudid Take 1 tablet (4 mg total) by mouth every 6 (six) hours as needed for severe pain (pain score 7-10).   lidocaine-hydrocortisone 3-1 % Kit Commonly known as: ANAMANTLE Place 1 Application rectally 2 (two) times daily.   loperamide 2 MG capsule Commonly known as: IMODIUM Take 2 capsules (4 mg total) by mouth 4 (four) times daily as needed for diarrhea or loose stools.   meclizine 25 MG  tablet Commonly known as: ANTIVERT TAKE 1 TABLET(25 MG) BY MOUTH THREE TIMES DAILY AS NEEDED FOR DIZZINESS   mirtazapine 7.5 MG tablet Commonly known as: REMERON Take 1 tablet (7.5 mg total) by mouth at bedtime.   nitrofurantoin (macrocrystal-monohydrate) 100 MG capsule Commonly known as: MACROBID Take 1 capsule (100 mg total) by mouth 2 (two) times daily.   nystatin-triamcinolone ointment Commonly known as: MYCOLOG Apply 1 Application topically 2 (two) times daily. For up to 7 days and then STOP.  Repeat as needed for flares after at least a 2 week break   ondansetron 8 MG tablet Commonly known as: ZOFRAN TAKE 1 TABLET(8 MG) BY MOUTH EVERY 8 HOURS AS NEEDED FOR NAUSEA OR VOMITING   orphenadrine 100 MG tablet Commonly known as: NORFLEX TAKE 1 TABLET(100 MG) BY MOUTH AT BEDTIME AS NEEDED FOR MUSCLE SPASMS   pantoprazole 40 MG tablet Commonly known as: PROTONIX TAKE 1 TABLET BY MOUTH EVERY DAY BEFORE BREAKFAST   potassium chloride 10 MEQ tablet Commonly known as: KLOR-CON M Take 2 tablets (20 mEq total) by mouth daily.   pramoxine-hydrocortisone 1-1 % rectal cream Commonly known as: PROCTOCREAM-HC APPLY RECTALLY TO THE AFFECTED AREA TWICE DAILY   promethazine 12.5 MG tablet Commonly known as: PHENERGAN TAKE 1 TABLET(12.5 MG) BY MOUTH EVERY 6 HOURS AS NEEDED FOR NAUSEA OR VOMITING   propranolol 20 MG tablet Commonly known as: INDERAL TAKE 1 TABLET(20 MG) BY MOUTH TWICE DAILY          REVIEW OF SYSTEMS: A comprehensive ROS was conducted with the patient and is negative except as per HPI     OBJECTIVE:  VS: BP 124/82 (BP Location: Left Arm, Patient Position: Sitting, Cuff Size: Normal)   Pulse 74   Ht 5\' 6"  (1.676 m)   Wt 242 lb (109.8 kg)   SpO2 96%   BMI 39.06 kg/m     Wt Readings from Last 3 Encounters:  12/29/23 240 lb (108.9 kg)  12/12/23 238 lb 6.4 oz (108.1 kg)  12/04/23 244 lb (110.7 kg)     EXAM: General: Pt appears well and is in NAD   Neck: General: Supple  without adenopathy. Thyroid: Thyroid size normal.  No goiter or nodules appreciated.  Lungs: Clear with good BS bilat   Heart: Auscultation: RRR.  Abdomen: Soft, nontender  Extremities:  BL LE: No pretibial edema   Mental Status: Judgment, insight: Intact Orientation: Oriented to time, place, and person Mood and affect: No depression, anxiety, or agitation     DATA REVIEWED:   Latest Reference Range & Units 01/19/24 14:15  Sodium 135 - 146 mmol/L 140  Potassium 3.5 - 5.3 mmol/L 3.9  Chloride 98 - 110 mmol/L 105  CO2 20 - 32 mmol/L 26  Glucose 65 - 99 mg/dL 161 (H)  BUN 7 - 25 mg/dL 17  Creatinine 0.96 - 0.45 mg/dL 4.09  Calcium 8.6 - 81.1 mg/dL 9.1  BUN/Creatinine Ratio 6 - 22 (calc) SEE NOTE:  eGFR > OR = 60 mL/min/1.44m2 67    Latest Reference Range & Units 01/19/24 14:15  Cortisol, Plasma mcg/dL 91.4  FSH mIU/mL 78.2  Prolactin ng/mL <1.0 (L)  Glucose 65 - 99 mg/dL 956 (H)  TSH 2.13 - 0.86 mIU/L 1.20  T4,Free(Direct) 0.8 - 1.8 ng/dL 1.0       Latest Reference Range & Units 01/10/24 12:53  Sodium 135 - 145 mmol/L 141  Potassium 3.5 - 5.1 mmol/L 3.5  Chloride 98 - 111 mmol/L 109  CO2 22 - 32 mmol/L 26  Glucose 70 - 99 mg/dL 578 (H)  BUN 8 - 23 mg/dL 17  Creatinine 4.69 - 6.29 mg/dL 5.28  Calcium 8.9 - 41.3 mg/dL 8.7 (L)  Anion gap 5 - 15  6  Alkaline Phosphatase 38 - 126 U/L 178 (H)  Albumin 3.5 - 5.0 g/dL 3.8  AST 15 - 41 U/L 27  ALT 0 - 44 U/L 26  Total Protein 6.5 - 8.1 g/dL 6.9  Total Bilirubin 0.0 - 1.2 mg/dL 0.5  GFR, Est Non African American >60 mL/min >60     Latest Reference Range & Units 04/17/23 12:17  Insulin-Like GF-1 60 - 207 ng/mL 92  FSH 25.8 - 134.8 mIU/mL 21.8 (L)  Prolactin 3.6 - 25.2 ng/mL 49.9 (H)  Glucose 70 - 99 mg/dL 244 (H)     MRI brain 0/07/2724 Brain: Left eccentric solid and cystic pituitary mass measuring 11 mm craniocaudal on T2 weighted imaging. No suprasellar extension or cavernous sinus  invasion. Normal appearing pituitary gland displaced into the right sella. In retrospect this was present on prior with similar dimensions by coronal T2 weighted imaging.   Meningioma measuring 19 mm near the torcula with evidence of invasion into the straight sinus.   No acute or subacute infarct, hemorrhage, hydrocephalus, or collection.   Vascular: Major flow voids and vascular enhancements are preserved. See comments about straight sinus above.   Skull and upper cervical spine: Normal marrow signal.   Sinuses/Orbits: Negative.   IMPRESSION: 1. 11 mm pituitary adenoma in the left eccentric sella. No invasion or mass effect on adjacent structures. 2. Unchanged 19 mm meningioma near the torcula with straight sinus invasion.         Thyroid Ultrasound 12/08/2022    Estimated total number of nodules >/= 1 cm: 1   Number of spongiform nodules >/=  2 cm not described below (TR1): 0   Number of mixed cystic and solid nodules >/= 1.5 cm not described below (TR2): 0   _________________________________________________________   Nodule # 1:   Location: Right; Inferior   Maximum size: 2.3 cm; Other 2 dimensions: 1.5 x 2.3 cm  Composition: solid/almost completely solid (2)   Echogenicity: isoechoic (1)   Shape: not taller-than-wide (0)   Margins: ill-defined (0)   Echogenic foci: none (0)   ACR TI-RADS total points: 3.   ACR TI-RADS risk category: TR3 (3 points).   ACR TI-RADS recommendations:   *Given size (>/= 1.5 - 2.4 cm) and appearance, a follow-up ultrasound in 1 year should be considered based on TI-RADS criteria.   _________________________________________________________   IMPRESSION: The nodule seen on CT imaging corresponds with a 2.3 cm ill-defined TI-RADS category 3 nodule versus pseudo nodule in the right lower gland. This lesion meets criteria for imaging surveillance. Recommend follow-up ultrasound in 1 years.   In office BG 226 mg/dL      ASSESSMENT/PLAN/RECOMMENDATIONS:   Pituitary Macroadenoma:       -Pituitary MRI showed 11 mm pituitary adenoma, no invasion or mass effect on adjacent structures -Unchanged 19 mm meningioma with straight sinus invasion -Will repeat MRI of the brain -BMP and TFTs normal   2.  Hyperprolactinemia:   -It is difficult to ascertain if this is due to prolactinoma or stalk effect -Prolactin is low, will decrease cabergoline as below  Medication Decrease cabergoline 0.5 mg, half a tablet twice weekly   3. Elevated ACTH:   -This has been slightly elevated but trending down -24-hour urinary cortisol has come back normal 2024 -Suspect this elevation is stress related -Will repeat labs today as well as repeating 24-hour urinary cortisol -Serum cortisol normal     4.  Right thyroid nodule:   -No local neck symptoms -TFTs are normal -She will be due for repeat ultrasound    5. T2DM, poorly controlled:  -Encourage frequent glucose checks at home -Historically used to be on basal/prandial insulin but has been off insulin since 2021 -She is currently on glimepiride, and I have recommended switching to glipizide  Medication  Stop glimepiride Start Glipizide 5 mg BID    F/U in 4 months   Signed electronically by: Lyndle Herrlich, MD  [facility] Endocr[facility]ology  [facility] [address] [place] [place], [place] [address] Phone: [phone] FAX: [phone]   CC: Shade Flood, MD [address] [place] [address] Phone: [phone] Fax: [phone]   Return to Endocrinology clinic as below: Future Appointments  Date Time Provider Department Center  01/19/2024  1:40 PM Alla Sloma, [name], MD [facility] None  02/08/2024 12:15 PM [facility] LAB [facility] None  02/08/2024 12:30 PM [facility] INJ NURSE [facility] None  02/08/2024 12:45 PM Ennever, Rose Phi, MD [facility] None  02/08/2024  1:00 PM [facility] INJ NURSE [facility] None   02/13/2024  2:30 PM [name], NP [facility] None  07/08/2024  1:30 PM Rennis Chris, MD [facility] None

## 2024-01-20 ENCOUNTER — Encounter: Payer: Self-pay | Admitting: Family Medicine

## 2024-01-20 DIAGNOSIS — F418 Other specified anxiety disorders: Secondary | ICD-10-CM

## 2024-01-22 ENCOUNTER — Other Ambulatory Visit (HOSPITAL_BASED_OUTPATIENT_CLINIC_OR_DEPARTMENT_OTHER): Payer: Self-pay | Admitting: Obstetrics & Gynecology

## 2024-01-22 ENCOUNTER — Encounter: Payer: Self-pay | Admitting: Family Medicine

## 2024-01-22 DIAGNOSIS — C786 Secondary malignant neoplasm of retroperitoneum and peritoneum: Secondary | ICD-10-CM

## 2024-01-22 DIAGNOSIS — R198 Other specified symptoms and signs involving the digestive system and abdomen: Secondary | ICD-10-CM

## 2024-01-22 DIAGNOSIS — R194 Change in bowel habit: Secondary | ICD-10-CM

## 2024-01-22 MED ORDER — DIAZEPAM 5 MG PO TABS: ORAL_TABLET | ORAL | 1 refills | Status: DC

## 2024-01-22 NOTE — Telephone Encounter (Signed)
 Patient has many concerns that have appeared and has talked these concerns with GYN and they instructed her to visit with you as well about these concerns to see if you recommended anything different. Please advise.

## 2024-01-22 NOTE — Telephone Encounter (Signed)
 Requested Prescriptions   Pending Prescriptions Disp Refills   diazepam (VALIUM) 5 MG tablet 4 tablet 1    Sig: Take 1 tab 30 minutes prior to enclosed imaging study.     Date of patient request: 01/22/2024 Last office visit: 11/09/2023 Upcoming visit: Visit date not found Date of last refill: 10/02/2023 Last refill amount: 4 tab 1 refill     Patient requesting due to having a recheck MRI coming up.

## 2024-01-22 NOTE — Telephone Encounter (Signed)
 Called pt in response to previous phone encounter. Advised pt that oily/foamy stool can be from a variety of causes including malabsorption, pancreatic enzyme deficiency, small bowel overgrowth, just to name a few. Advised that Dr. Hyacinth Meeker can refer her to GI but suggests she also reach out to her PCP as he could do things like a stool culture while waiting on the GI referral. Pt would like to proceed with referral.

## 2024-01-22 NOTE — Telephone Encounter (Signed)
 No problem at all.  I have sent prescription to the pharmacy.  Please advise patient.  Controlled substance database was reviewed.

## 2024-01-23 ENCOUNTER — Other Ambulatory Visit

## 2024-01-23 ENCOUNTER — Other Ambulatory Visit (INDEPENDENT_AMBULATORY_CARE_PROVIDER_SITE_OTHER)

## 2024-01-23 ENCOUNTER — Encounter: Payer: Self-pay | Admitting: Internal Medicine

## 2024-01-23 ENCOUNTER — Other Ambulatory Visit: Payer: Self-pay

## 2024-01-23 ENCOUNTER — Encounter: Payer: Self-pay | Admitting: Family Medicine

## 2024-01-23 DIAGNOSIS — K625 Hemorrhage of anus and rectum: Secondary | ICD-10-CM

## 2024-01-23 DIAGNOSIS — N390 Urinary tract infection, site not specified: Secondary | ICD-10-CM

## 2024-01-23 LAB — CBC
HCT: 40.5 % (ref 36.0–46.0)
Hemoglobin: 13.2 g/dL (ref 12.0–15.0)
MCHC: 32.5 g/dL (ref 30.0–36.0)
MCV: 95.4 fl (ref 78.0–100.0)
Platelets: 247 10*3/uL (ref 150.0–400.0)
RBC: 4.24 Mil/uL (ref 3.87–5.11)
RDW: 14 % (ref 11.5–15.5)
WBC: 9.5 10*3/uL (ref 4.0–10.5)

## 2024-01-23 MED ORDER — CABERGOLINE 0.5 MG PO TABS: 0.2500 mg | ORAL_TABLET | ORAL | 3 refills | Status: DC

## 2024-01-23 NOTE — Telephone Encounter (Signed)
 Noted. I think that is a reasonable plan. Thanks.

## 2024-01-23 NOTE — Telephone Encounter (Signed)
 Message sent to patient, does not appear that C. difficile testing was performed after her last visit, advised her to have that done, but may be best to have a visit to discuss the symptoms further in the next few days.  Please schedule a visit with me any opening this week if possible.

## 2024-01-23 NOTE — Telephone Encounter (Signed)
 Patient has been contacted. She wants to wait on making an appointment until she has the tests done. She is going to do the labs as soon as possible and she is going to call Dr. Chelsea Primus office to get the information to scheduled the sigmoidoscopy, she stated no one ever called her to schedule it. She is going to call back to make an appointment with you once she has the results from the tests so you can go over them with her.

## 2024-01-24 ENCOUNTER — Other Ambulatory Visit

## 2024-01-24 DIAGNOSIS — R197 Diarrhea, unspecified: Secondary | ICD-10-CM

## 2024-01-24 DIAGNOSIS — K529 Noninfective gastroenteritis and colitis, unspecified: Secondary | ICD-10-CM

## 2024-01-24 DIAGNOSIS — Z8619 Personal history of other infectious and parasitic diseases: Secondary | ICD-10-CM

## 2024-01-24 LAB — URINE CULTURE
MICRO NUMBER:: 16273651
SPECIMEN QUALITY:: ADEQUATE

## 2024-01-25 ENCOUNTER — Other Ambulatory Visit: Payer: Self-pay | Admitting: Behavioral Health

## 2024-01-25 DIAGNOSIS — F331 Major depressive disorder, recurrent, moderate: Secondary | ICD-10-CM

## 2024-01-25 DIAGNOSIS — F411 Generalized anxiety disorder: Secondary | ICD-10-CM

## 2024-01-25 LAB — ACTH: C206 ACTH: 58 pg/mL — ABNORMAL HIGH (ref 6–50)

## 2024-01-25 LAB — INSULIN-LIKE GROWTH FACTOR
IGF-I, LC/MS: 51 ng/mL (ref 41–279)
Z-Score (Female): -1.8 {STDV} (ref ?–2.0)

## 2024-01-25 LAB — BASIC METABOLIC PANEL WITH GFR
BUN: 17 mg/dL (ref 7–25)
CO2: 26 mmol/L (ref 20–32)
Calcium: 9.1 mg/dL (ref 8.6–10.4)
Chloride: 105 mmol/L (ref 98–110)
Creat: 0.96 mg/dL (ref 0.50–1.05)
Glucose, Bld: 217 mg/dL — ABNORMAL HIGH (ref 65–99)
Potassium: 3.9 mmol/L (ref 3.5–5.3)
Sodium: 140 mmol/L (ref 135–146)
eGFR: 67 mL/min/{1.73_m2} (ref >=60)

## 2024-01-25 LAB — PROLACTIN: Prolactin: <1 ng/mL — ABNORMAL LOW

## 2024-01-25 LAB — CORTISOL: Cortisol, Plasma: 15.6 ug/dL

## 2024-01-25 LAB — TSH: TSH: 1.2 m[IU]/L (ref 0.40–4.50)

## 2024-01-25 LAB — FOLLICLE STIMULATING HORMONE: FSH: 21 m[IU]/mL

## 2024-01-25 LAB — T4, FREE: Free T4: 1 ng/dL (ref 0.8–1.8)

## 2024-01-26 ENCOUNTER — Other Ambulatory Visit: Payer: Self-pay | Admitting: Family Medicine

## 2024-01-26 DIAGNOSIS — R197 Diarrhea, unspecified: Secondary | ICD-10-CM

## 2024-01-26 DIAGNOSIS — Z8619 Personal history of other infectious and parasitic diseases: Secondary | ICD-10-CM

## 2024-01-26 LAB — CLOSTRIDIUM DIFFICILE BY PCR: Toxigenic C. Difficile by PCR: POSITIVE — AB

## 2024-01-26 MED ORDER — VANCOMYCIN HCL 125 MG PO CAPS: ORAL_CAPSULE | ORAL | 0 refills | Status: DC

## 2024-01-26 NOTE — Progress Notes (Signed)
 See prior notes. Chronic diarrhea, loose stools. Mild thickening of mid sigmoid colon which may represent sequelae associated with mild colitis on 3/7. Positive C diff PCR.  Has had worse stools past few months. More diarrhea, lower pain at times. No measured fever. Foamy stools past few weeks, oily stool at times.  Has not received referral to GI yet.  Discussed with on-call infectious disease given current situation, risk factors for C. difficile.  Did recommend going ahead and starting oral vancomycin, prolonged course and ID office follow-up.  I will also place referral to gastroenterology.  Will start C. difficile treatment with oral vancomycin 125 mg every 6 hours for 10 days, then 125 mg twice daily for 7 days, followed by daily for 7 days, then every 2 days for 4 weeks.  Should be seen by infectious disease during that time.  Advised patient of plan with RTC/ER precautions.  Antibiotic was phoned into pharmacy.

## 2024-01-29 ENCOUNTER — Telehealth: Payer: Self-pay | Admitting: Gastroenterology

## 2024-01-29 ENCOUNTER — Ambulatory Visit: Payer: Medicare Other | Admitting: Internal Medicine

## 2024-01-29 ENCOUNTER — Other Ambulatory Visit: Payer: Self-pay | Admitting: Hematology & Oncology

## 2024-01-29 ENCOUNTER — Other Ambulatory Visit: Payer: Self-pay | Admitting: Behavioral Health

## 2024-01-29 DIAGNOSIS — F5105 Insomnia due to other mental disorder: Secondary | ICD-10-CM

## 2024-01-29 NOTE — Telephone Encounter (Signed)
 Good afternoon Dr. Lavon Paganini,   Doc of Day PM 4/7  We received a urgent referral for patient for diarrhea and history of C. Diff. Referral notes also stated patent has had multiple prior abdominal surgeries. Patient has chronic diarrhea. Had a recent visit with surgical oncology, Dr. Lenis Noon at [facility]. Oncologist locally Dr. Myna Hidalgo. Recent changes in stools with oily stools, foamy stools at times. Eval and treat within the next 2 weeks if possible.   Please review and advise on scheduling. Patient stated she will be out of town for the rest of the week.  Thank you.

## 2024-01-30 ENCOUNTER — Other Ambulatory Visit: Payer: Self-pay | Admitting: Hematology & Oncology

## 2024-01-30 ENCOUNTER — Telehealth: Payer: Self-pay

## 2024-01-30 DIAGNOSIS — H8112 Benign paroxysmal vertigo, left ear: Secondary | ICD-10-CM

## 2024-01-30 LAB — CORTISOL, URINE, 24 HOUR
24 Hour urine volume (VMAHVA): 950 mL
CREATININE, URINE: 0.91 g/(24.h) (ref 0.50–2.15)
Cortisol (Ur), Free: 9.3 ug/(24.h) (ref 4.0–50.0)

## 2024-01-30 NOTE — Telephone Encounter (Signed)
 Copied from CRM [id number]. Topic: Clinical - Medical Advice >> Jan 30, 2024  9:10 AM Wendy Barber wrote: Reason for CRM: Patient is calling in because she has a family trip and is taking an airplane. Patient states she was diagnosed with CDIFF and would like to know if she is contagious. She would like some advice on what her next steps are and if it would still be okay to travel

## 2024-01-30 NOTE — Telephone Encounter (Signed)
 Should be ok to travel. Not airborne/droplet, but still should be careful - especially after using the bathroom.  Here is excerpt form CDC  - she can obtain this info as well on CDC website.   "If you have C. diff infection or are caring for someone with C. diff infection, wash your hands with soap and water every time you use the bathroom and before you eat. Remind relatives and friends taking care of you to do the same. Try to use a separate bathroom if you have diarrhea from C. diff infection. If you can't, be sure the commonly touched surfaces in the bathroom are cleaned before others use it. Take showers and wash with soap to remove any C. diff germs you could have on your body."

## 2024-01-31 NOTE — Telephone Encounter (Signed)
 Called patient to relay Dr.greene's notes. Patient verbalized understanding and no questions or concerns at this time.

## 2024-02-01 NOTE — Telephone Encounter (Signed)
 She is being treated by PMD for C.diff. Please schedule next available appointment with APP for management of chronic diarrhea

## 2024-02-03 ENCOUNTER — Other Ambulatory Visit: Payer: Self-pay | Admitting: Hematology & Oncology

## 2024-02-06 ENCOUNTER — Inpatient Hospital Stay: Attending: Hematology & Oncology

## 2024-02-06 DIAGNOSIS — R5383 Other fatigue: Secondary | ICD-10-CM | POA: Insufficient documentation

## 2024-02-06 DIAGNOSIS — M549 Dorsalgia, unspecified: Secondary | ICD-10-CM | POA: Insufficient documentation

## 2024-02-06 DIAGNOSIS — Z933 Colostomy status: Secondary | ICD-10-CM | POA: Insufficient documentation

## 2024-02-06 DIAGNOSIS — R300 Dysuria: Secondary | ICD-10-CM | POA: Insufficient documentation

## 2024-02-06 DIAGNOSIS — Z7901 Long term (current) use of anticoagulants: Secondary | ICD-10-CM | POA: Insufficient documentation

## 2024-02-06 DIAGNOSIS — R109 Unspecified abdominal pain: Secondary | ICD-10-CM | POA: Insufficient documentation

## 2024-02-06 DIAGNOSIS — D510 Vitamin B12 deficiency anemia due to intrinsic factor deficiency: Secondary | ICD-10-CM | POA: Insufficient documentation

## 2024-02-06 DIAGNOSIS — Z880 Allergy status to penicillin: Secondary | ICD-10-CM | POA: Insufficient documentation

## 2024-02-06 DIAGNOSIS — G8929 Other chronic pain: Secondary | ICD-10-CM | POA: Insufficient documentation

## 2024-02-06 DIAGNOSIS — K529 Noninfective gastroenteritis and colitis, unspecified: Secondary | ICD-10-CM | POA: Insufficient documentation

## 2024-02-06 DIAGNOSIS — Z79899 Other long term (current) drug therapy: Secondary | ICD-10-CM | POA: Insufficient documentation

## 2024-02-06 DIAGNOSIS — Z881 Allergy status to other antibiotic agents status: Secondary | ICD-10-CM | POA: Insufficient documentation

## 2024-02-06 DIAGNOSIS — I2699 Other pulmonary embolism without acute cor pulmonale: Secondary | ICD-10-CM | POA: Insufficient documentation

## 2024-02-06 DIAGNOSIS — Z888 Allergy status to other drugs, medicaments and biological substances status: Secondary | ICD-10-CM | POA: Insufficient documentation

## 2024-02-06 DIAGNOSIS — C181 Malignant neoplasm of appendix: Secondary | ICD-10-CM | POA: Insufficient documentation

## 2024-02-06 NOTE — Progress Notes (Signed)
 [facility] Clinical Social Work  Initial Assessment   Wendy Barber is a 63 y.o. year old female contacted by phone. Clinical Social Work was referred by nurse for assessment of psychosocial needs.   SDOH (Social Determinants of Health) assessments performed: Yes SDOH Interventions    Flowsheet Row Video Visit from 07/10/2023 in [facility] Clinical Support from 03/09/2023 in [facility] Visit from 12/06/2022 in [facility] Visit from 07/22/2022 in [facility] [facility] Visit from 04/21/2022 in [facility] [employer] [facility] Visit from 11/29/2021 in [facility] [employer]  SDOH Interventions        Food Insecurity Interventions -- Intervention Not Indicated -- -- -- --  Housing Interventions -- Intervention Not Indicated -- -- -- --  Transportation Interventions -- Intervention Not Indicated -- -- -- --  Utilities Interventions -- Intervention Not Indicated -- -- -- --  Alcohol Usage Interventions -- Intervention Not Indicated (Score <7) -- -- -- --  Depression Interventions/Treatment  Currently on Treatment Currently on Treatment Currently on Treatment Currently on Treatment Counseling Currently on Treatment  Financial Strain Interventions -- Intervention Not Indicated -- -- -- --  Physical Activity Interventions -- Intervention Not Indicated -- -- -- --  Stress Interventions -- Intervention Not Indicated -- -- -- --       SDOH Screenings   Food Insecurity: No Food Insecurity (10/16/2023)  Housing: Low Risk  (10/16/2023)  Transportation Needs: No Transportation Needs (10/16/2023)  Utilities: Not At Risk (03/09/2023)  Alcohol Screen: Low Risk  (03/09/2023)  Depression (PHQ2-9): Medium Risk (01/04/2024)  Financial Resource Strain: Patient Declined (10/16/2023)   Physical Activity: Inactive (10/16/2023)  Social Connections: Socially Integrated (10/16/2023)  Stress: Stress Concern Present (10/16/2023)  Tobacco Use: Low Risk  (01/19/2024)     Distress Screen completed: No     No data to display            Family/Social Information:  Housing Arrangement: patient lives with her husband. Family members/support persons in your life? Family.  Her daughter is an Charity fundraiser and lives near her. Transportation concerns: no  Employment: Legally disabled  Income source: Secretary/administrator concerns: No Type of concern: None Food access concerns: no Religious or spiritual practice: Yes-Non-denominational Advanced directives: Yes-Patient stated she has them and will bring them into the office so that they can be scanned into MyChart. Services Currently in place:  Medicare and Tricare.  Coping/ Adjustment to diagnosis: Patient understands treatment plan and what happens next? yes Concerns about diagnosis and/or treatment: I'm not especially worried about anything Patient reported stressors:  None at this time. Hopes and/or priorities: Family Patient enjoys time with family/ friends Current coping skills/ strengths: Active sense of humor , Average or above average intelligence , Capable of independent living , Communication skills , Contractor , General fund of knowledge , Motivation for treatment/growth , and Supportive family/friends     SUMMARY: Current SDOH Barriers:  None per patient.  Clinical Social Work Clinical Goal(s):  No clinical social work goals at this time  Interventions: Discussed common feeling and emotions when being diagnosed with cancer, and the importance of support during treatment Informed patient of the support team roles and support services at [facility] Provided CSW contact information and encouraged patient to call with any questions or concerns Provided patient with information about CSW role and Advance  Directives.   Follow Up Plan: Patient will contact CSW with any support or resource needs Patient verbalizes understanding of plan: Yes    Kennth Peal, LCSW Clinical Social Worker [facility]

## 2024-02-07 ENCOUNTER — Ambulatory Visit
Admission: RE | Admit: 2024-02-07 | Discharge: 2024-02-07 | Disposition: A | Discharge status: Home / Self Care | Source: Ambulatory Visit | Attending: Internal Medicine | Admitting: Internal Medicine

## 2024-02-07 ENCOUNTER — Other Ambulatory Visit: Payer: Self-pay

## 2024-02-07 DIAGNOSIS — D352 Benign neoplasm of pituitary gland: Secondary | ICD-10-CM

## 2024-02-07 DIAGNOSIS — N3000 Acute cystitis without hematuria: Secondary | ICD-10-CM

## 2024-02-07 DIAGNOSIS — R799 Abnormal finding of blood chemistry, unspecified: Secondary | ICD-10-CM

## 2024-02-08 ENCOUNTER — Encounter: Payer: Self-pay | Admitting: Hematology & Oncology

## 2024-02-08 ENCOUNTER — Other Ambulatory Visit: Payer: Self-pay | Admitting: *Deleted

## 2024-02-08 ENCOUNTER — Other Ambulatory Visit: Payer: Self-pay | Admitting: Internal Medicine

## 2024-02-08 ENCOUNTER — Inpatient Hospital Stay

## 2024-02-08 ENCOUNTER — Inpatient Hospital Stay (HOSPITAL_BASED_OUTPATIENT_CLINIC_OR_DEPARTMENT_OTHER): Admitting: Hematology & Oncology

## 2024-02-08 DIAGNOSIS — I2699 Other pulmonary embolism without acute cor pulmonale: Secondary | ICD-10-CM

## 2024-02-08 DIAGNOSIS — C181 Malignant neoplasm of appendix: Secondary | ICD-10-CM

## 2024-02-08 DIAGNOSIS — D510 Vitamin B12 deficiency anemia due to intrinsic factor deficiency: Secondary | ICD-10-CM

## 2024-02-08 DIAGNOSIS — E061 Subacute thyroiditis: Secondary | ICD-10-CM

## 2024-02-08 DIAGNOSIS — R799 Abnormal finding of blood chemistry, unspecified: Secondary | ICD-10-CM

## 2024-02-08 DIAGNOSIS — K649 Unspecified hemorrhoids: Secondary | ICD-10-CM

## 2024-02-08 DIAGNOSIS — I82412 Acute embolism and thrombosis of left femoral vein: Secondary | ICD-10-CM

## 2024-02-08 DIAGNOSIS — R8271 Bacteriuria: Secondary | ICD-10-CM

## 2024-02-08 DIAGNOSIS — R110 Nausea: Secondary | ICD-10-CM

## 2024-02-08 DIAGNOSIS — R300 Dysuria: Secondary | ICD-10-CM

## 2024-02-08 DIAGNOSIS — C772 Secondary and unspecified malignant neoplasm of intra-abdominal lymph nodes: Secondary | ICD-10-CM

## 2024-02-08 DIAGNOSIS — G8929 Other chronic pain: Secondary | ICD-10-CM | POA: Diagnosis not present

## 2024-02-08 DIAGNOSIS — R109 Unspecified abdominal pain: Secondary | ICD-10-CM | POA: Diagnosis not present

## 2024-02-08 DIAGNOSIS — Z880 Allergy status to penicillin: Secondary | ICD-10-CM | POA: Diagnosis not present

## 2024-02-08 DIAGNOSIS — E1165 Type 2 diabetes mellitus with hyperglycemia: Secondary | ICD-10-CM

## 2024-02-08 DIAGNOSIS — Z888 Allergy status to other drugs, medicaments and biological substances status: Secondary | ICD-10-CM | POA: Diagnosis not present

## 2024-02-08 DIAGNOSIS — K769 Liver disease, unspecified: Secondary | ICD-10-CM

## 2024-02-08 DIAGNOSIS — N3000 Acute cystitis without hematuria: Secondary | ICD-10-CM

## 2024-02-08 DIAGNOSIS — Z7901 Long term (current) use of anticoagulants: Secondary | ICD-10-CM | POA: Diagnosis not present

## 2024-02-08 DIAGNOSIS — C7A1 Malignant poorly differentiated neuroendocrine tumors: Secondary | ICD-10-CM

## 2024-02-08 DIAGNOSIS — Z95828 Presence of other vascular implants and grafts: Secondary | ICD-10-CM

## 2024-02-08 DIAGNOSIS — E860 Dehydration: Secondary | ICD-10-CM

## 2024-02-08 DIAGNOSIS — Z85038 Personal history of other malignant neoplasm of large intestine: Secondary | ICD-10-CM

## 2024-02-08 DIAGNOSIS — C786 Secondary malignant neoplasm of retroperitoneum and peritoneum: Secondary | ICD-10-CM

## 2024-02-08 DIAGNOSIS — Z933 Colostomy status: Secondary | ICD-10-CM | POA: Diagnosis not present

## 2024-02-08 DIAGNOSIS — M549 Dorsalgia, unspecified: Secondary | ICD-10-CM | POA: Diagnosis not present

## 2024-02-08 DIAGNOSIS — F064 Anxiety disorder due to known physiological condition: Secondary | ICD-10-CM

## 2024-02-08 DIAGNOSIS — Z79899 Other long term (current) drug therapy: Secondary | ICD-10-CM | POA: Diagnosis not present

## 2024-02-08 DIAGNOSIS — Z881 Allergy status to other antibiotic agents status: Secondary | ICD-10-CM | POA: Diagnosis not present

## 2024-02-08 DIAGNOSIS — C178 Malignant neoplasm of overlapping sites of small intestine: Secondary | ICD-10-CM

## 2024-02-08 DIAGNOSIS — R5383 Other fatigue: Secondary | ICD-10-CM | POA: Diagnosis not present

## 2024-02-08 DIAGNOSIS — K529 Noninfective gastroenteritis and colitis, unspecified: Secondary | ICD-10-CM | POA: Diagnosis not present

## 2024-02-08 DIAGNOSIS — D352 Benign neoplasm of pituitary gland: Secondary | ICD-10-CM

## 2024-02-08 LAB — URINALYSIS, COMPLETE (UACMP) WITH MICROSCOPIC
Glucose, UA: 100 mg/dL — AB
Ketones, ur: NEGATIVE mg/dL
Nitrite: POSITIVE — AB
Protein, ur: 30 mg/dL — AB
Specific Gravity, Urine: >=1.03 (ref 1.005–1.030)
pH: 5.5 (ref 5.0–8.0)

## 2024-02-08 LAB — CMP (CANCER CENTER ONLY)
ALT: 32 U/L (ref 0–44)
AST: 36 U/L (ref 15–41)
Albumin: 3.7 g/dL (ref 3.5–5.0)
Alkaline Phosphatase: 148 U/L — ABNORMAL HIGH (ref 38–126)
Anion gap: 7 (ref 5–15)
BUN: 17 mg/dL (ref 8–23)
CO2: 32 mmol/L (ref 22–32)
Calcium: 8.8 mg/dL — ABNORMAL LOW (ref 8.9–10.3)
Chloride: 101 mmol/L (ref 98–111)
Creatinine: 0.98 mg/dL (ref 0.44–1.00)
GFR, Estimated: >60 mL/min (ref >60)
Glucose, Bld: 227 mg/dL — ABNORMAL HIGH (ref 70–99)
Potassium: 3.9 mmol/L (ref 3.5–5.1)
Sodium: 140 mmol/L (ref 135–145)
Total Bilirubin: 0.5 mg/dL (ref 0.0–1.2)
Total Protein: 6.6 g/dL (ref 6.5–8.1)

## 2024-02-08 LAB — CBC WITH DIFFERENTIAL (CANCER CENTER ONLY)
Abs Immature Granulocytes: 0.03 10*3/uL (ref 0.00–0.07)
Basophils Absolute: 0.1 10*3/uL (ref 0.0–0.1)
Basophils Relative: 1 %
Eosinophils Absolute: 0.2 10*3/uL (ref 0.0–0.5)
Eosinophils Relative: 3 %
HCT: 37.2 % (ref 36.0–46.0)
Hemoglobin: 12 g/dL (ref 12.0–15.0)
Immature Granulocytes: 0 %
Lymphocytes Relative: 37 %
Lymphs Abs: 2.9 10*3/uL (ref 0.7–4.0)
MCH: 31.3 pg (ref 26.0–34.0)
MCHC: 32.3 g/dL (ref 30.0–36.0)
MCV: 96.9 fL (ref 80.0–100.0)
Monocytes Absolute: 0.7 10*3/uL (ref 0.1–1.0)
Monocytes Relative: 8 %
Neutro Abs: 3.9 10*3/uL (ref 1.7–7.7)
Neutrophils Relative %: 51 %
Platelet Count: 261 10*3/uL (ref 150–400)
RBC: 3.84 MIL/uL — ABNORMAL LOW (ref 3.87–5.11)
RDW: 14.3 % (ref 11.5–15.5)
WBC Count: 7.7 10*3/uL (ref 4.0–10.5)
nRBC: 0 % (ref 0.0–0.2)

## 2024-02-08 LAB — IRON AND IRON BINDING CAPACITY (CC-WL,HP ONLY)
Iron: 87 ug/dL (ref 28–170)
Saturation Ratios: 25 % (ref 10.4–31.8)
TIBC: 347 ug/dL (ref 250–450)
UIBC: 260 ug/dL (ref 148–442)

## 2024-02-08 LAB — FERRITIN: Ferritin: 49 ng/mL (ref 11–307)

## 2024-02-08 LAB — LACTATE DEHYDROGENASE: LDH: 206 U/L — ABNORMAL HIGH (ref 98–192)

## 2024-02-08 MED ORDER — LANREOTIDE ACETATE 120 MG/0.5ML ~~LOC~~ SOLN
120.0000 mg | Freq: Once | SUBCUTANEOUS | Status: AC
  Administered 2024-02-08: 120 mg via SUBCUTANEOUS
  Filled 2024-02-08: qty 120

## 2024-02-08 MED ORDER — HEPARIN SOD (PORK) LOCK FLUSH 100 UNIT/ML IV SOLN
500.0000 [IU] | Freq: Once | INTRAVENOUS | Status: AC
Start: 2024-02-08 — End: 2024-02-08
  Administered 2024-02-08: 500 [IU] via INTRAVENOUS

## 2024-02-08 MED ORDER — HYDROMORPHONE HCL 4 MG PO TABS: 4.0000 mg | ORAL_TABLET | Freq: Four times a day (QID) | ORAL | 0 refills | Status: DC | PRN

## 2024-02-08 MED ORDER — SODIUM CHLORIDE 0.9% FLUSH
10.0000 mL | INTRAVENOUS | Status: DC | PRN
  Administered 2024-02-08: 10 mL via INTRAVENOUS

## 2024-02-08 NOTE — Patient Instructions (Signed)

## 2024-02-08 NOTE — Patient Instructions (Signed)
 Lanreotide Injection What is this medication? LANREOTIDE (lan REE oh tide) treats high levels of growth hormone (acromegaly). It is used when other therapies have not worked well enough or cannot be tolerated. It works by reducing the amount of growth hormone your body makes. This reduces symptoms and the risk of health problems caused by too much growth hormone, such as diabetes and heart disease. It may also be used to treat neuroendocrine tumors, a cancer of the cells that release hormones and other substances in your body. It works by slowing down the release of these substances from the cells. This slows tumor growth. It also decreases the symptoms of carcinoid syndrome, such as flushing or diarrhea. This medicine may be used for other purposes; ask your health care provider or pharmacist if you have questions. COMMON BRAND NAME(S): Somatuline Depot What should I tell my care team before I take this medication? They need to know if you have any of these conditions: Diabetes Gallbladder disease Heart disease Kidney disease Liver disease Thyroid disease An unusual or allergic reaction to lanreotide, other medications, foods, dyes, or preservatives Pregnant or trying to get pregnant Breast-feeding How should I use this medication? This medication is injected under the skin. It is given by your care team in a hospital or clinic setting. Talk to your care team about the use of this medication in children. Special care may be needed. Overdosage: If you think you have taken too much of this medicine contact a poison control center or emergency room at once. NOTE: This medicine is only for you. Do not share this medicine with others. What if I miss a dose? Keep appointments for follow-up doses. It is important not to miss your dose. Call your care team if you are unable to keep an appointment. What may interact with this medication? Bromocriptine Cyclosporine Certain medications for blood  pressure, heart disease, irregular heartbeat Certain medications for diabetes Quinidine Terfenadine This list may not describe all possible interactions. Give your health care provider a list of all the medicines, herbs, non-prescription drugs, or dietary supplements you use. Also tell them if you smoke, drink alcohol, or use illegal drugs. Some items may interact with your medicine. What should I watch for while using this medication? Visit your care team for regular checks on your progress. Tell your care team if your symptoms do not start to get better or if they get worse. Your condition will be monitored carefully while you are receiving this medication. You may need blood work while you are taking this medication. This medication may increase blood sugar. The risk may be higher in patients who already have diabetes. Ask your care team what you can do to lower your risk of diabetes while taking this medication. Talk to your care team if you wish to become pregnant or think you may be pregnant. This medication can cause serious birth defects. Do not breast-feed while taking this medication and for 6 months after stopping therapy. This medication may cause infertility. Talk to your care team if you are concerned about your fertility. What side effects may I notice from receiving this medication? Side effects that you should report to your care team as soon as possible: Allergic reactions--skin rash, itching, hives, swelling of the face, lips, tongue, or throat Gallbladder problems--severe stomach pain, nausea, vomiting, fever High blood sugar (hyperglycemia)--increased thirst or amount of urine, unusual weakness or fatigue, blurry vision Increase in blood pressure Low blood sugar (hypoglycemia)--tremors or shaking, anxiety, sweating, cold  or clammy skin, confusion, dizziness, rapid heartbeat Low thyroid levels (hypothyroidism)--unusual weakness or fatigue, increased sensitivity to cold,  constipation, hair loss, dry skin, weight gain, feelings of depression Slow heartbeat--dizziness, feeling faint or lightheaded, confusion, trouble breathing, unusual weakness or fatigue Side effects that usually do not require medical attention (report to your care team if they continue or are bothersome): Diarrhea Dizziness Headache Muscle spasms Nausea Pain, redness, irritation, or bruising at the injection site Stomach pain This list may not describe all possible side effects. Call your doctor for medical advice about side effects. You may report side effects to FDA at 1-800-FDA-1088. Where should I keep my medication? This medication is given in a hospital or clinic. It will not be stored at home. NOTE: This sheet is a summary. It may not cover all possible information. If you have questions about this medicine, talk to your doctor, pharmacist, or health care provider.  2024 Elsevier/Gold Standard (2022-03-02 00:00:00) Implanted [facility] Guide An implanted port is a device that is placed under the skin. It is usually placed in the chest. The device may vary based on the need. Implanted ports can be used to give IV medicine, to take blood, or to give fluids. You may have an implanted port if: You need IV medicine that would be irritating to the small veins in your hands or arms. You need IV medicines, such as chemotherapy, for a long period of time. You need IV nutrition for a long period of time. You may have fewer limitations when using a port than you would if you used other types of long-term IVs. You will also likely be able to return to normal activities after your incision heals. An implanted port has two main parts: Reservoir. The reservoir is the part where a needle is inserted to give medicines or draw blood. The reservoir is round. After the port is placed, it appears as a small, raised area under your skin. Catheter. The catheter is a small, thin tube that connects the  reservoir to a vein. Medicine that is inserted into the reservoir goes into the catheter and then into the vein. How is my port accessed? To access your port: A numbing cream may be placed on the skin over the port site. Your health care provider will put on a mask and sterile gloves. The skin over your port will be cleaned carefully with a germ-killing soap and allowed to dry. Your health care provider will gently pinch the port and insert a needle into it. Your health care provider will check for a blood return to make sure the port is in the vein and is still working (patent). If your port needs to remain accessed to get medicine continuously (constant infusion), your health care provider will place a clear bandage (dressing) over the needle site. The dressing and needle will need to be changed every week, or as told by your health care provider. What is flushing? Flushing helps keep the port working. Follow instructions from your health care provider about how and when to flush the port. Ports are usually flushed with saline solution or a medicine called heparin. The need for flushing will depend on how the port is used: If the port is only used from time to time to give medicines or draw blood, the port may need to be flushed: Before and after medicines have been given. Before and after blood has been drawn. As part of routine maintenance. Flushing may be recommended every 4-6 weeks.  If a constant infusion is running, the port may not need to be flushed. Throw away any syringes in a disposal container that is meant for sharp items (sharps container). You can buy a sharps container from a pharmacy, or you can make one by using an empty hard plastic bottle with a cover. How long will my port stay implanted? The port can stay in for as long as your health care provider thinks it is needed. When it is time for the port to come out, a surgery will be done to remove it. The surgery will be similar  to the procedure that was done to put the port in. Follow these instructions at home: Caring for your port and port site Flush your port as told by your health care provider. If you need an infusion over several days, follow instructions from your health care provider about how to take care of your port site. Make sure you: Change your dressing as told by your health care provider. Wash your hands with soap and water for at least 20 seconds before and after you change your dressing. If soap and water are not available, use alcohol-based hand sanitizer. Place any used dressings or infusion bags into a plastic bag. Throw that bag in the trash. Keep the dressing that covers the needle clean and dry. Do not get it wet. Do not use scissors or sharp objects near the infusion tubing. Keep any external tubes clamped, unless they are being used. Check your port site every day for signs of infection. Check for: Redness, swelling, or pain. Fluid or blood. Warmth. Pus or a bad smell. Protect the skin around the port site. Avoid wearing bra straps that rub or irritate the site. Protect the skin around your port from seat belts. Place a soft pad over your chest if needed. Bathe or shower as told by your health care provider. The site may get wet as long as you are not actively receiving an infusion. General instructions  Return to your normal activities as told by your health care provider. Ask your health care provider what activities are safe for you. Carry a medical alert card or wear a medical alert bracelet at all times. This will let health care providers know that you have an implanted port in case of an emergency. Where to find more information American Cancer Society: www.cancer.org American Society of Clinical Oncology: www.cancer.net Contact a health care provider if: You have a fever or chills. You have redness, swelling, or pain at the port site. You have fluid or blood coming from your  port site. Your incision feels warm to the touch. You have pus or a bad smell coming from the port site. Summary Implanted ports are usually placed in the chest for long-term IV access. Follow instructions from your health care provider about flushing the port and changing bandages (dressings). Take care of the area around your port by avoiding clothing that puts pressure on the area, and by watching for signs of infection. Protect the skin around your port from seat belts. Place a soft pad over your chest if needed. Contact a health care provider if you have a fever or you have redness, swelling, pain, fluid, or a bad smell at the port site. This information is not intended to replace advice given to you by your health care provider. Make sure you discuss any questions you have with your health care provider. Document Revised: 04/13/2021 Document Reviewed: 04/13/2021 Elsevier Patient Education  2024  [employer].

## 2024-02-08 NOTE — Progress Notes (Signed)
 Hematology and Oncology Follow Up Visit  Joanette Silveria [id number] [birth year] 63 y.o. 02/08/2024   Principle Diagnosis:  History of metastatic appendiceal cancer  -- recurrent  Superior mesenteric vein thrombus Thromboembolism of the RIGHT leg Pernicious anemia Acute pulmonary embolism-segmental right pulmonary artery Carcinoid/neuroendocrine carcinoma  Current Therapy:   HIPEC - Surgery done in [place] in 11/2020 Arixtra 10 mg subcu daily --start on 2/16 2024 Vitamin B12 5000 mcg PO daily  Somatuline 120 mg IM monthly-start on 07/12/2023     Interim History:  Ms. Hannis is in for follow-up.  This is probably best to have seen her look in a long time.  She actually saw Dr. Arline Laity at [facility].  He has had of Surgical Oncology.  He saw her to try to help with the diarrhea.  He said that he would do a colostomy if necessary.  He was quite thorough with her.  I am so glad that she was able to get into see him.  She was referred to Dr. Arline Laity by Dr. Annabell Key.  Dr. Annabell Key saw her because the possibility of a rectovaginal fistula.  I do not think 1 was really found.  However, Dr. Annabell Key felt that Dr. Arline Laity would be the best person to try to help Ms. Brothers.  Ms. Sullenger also was found to have C. difficile.  She now is on oral vancomycin.  She is on a taper of oral vancomycin.  Her last Chromogranin A level was down to 35.  As such, the Somatuline is helping quite a bit.  She continues on the Arixtra for the history of thromboembolic disease.  She does have some chronic pain in the legs.  This is more so the left leg.  Her last iron levels back in March showed a ferritin of 67 with iron saturation of 25%.  Currently, I would say that her performance status is probably ECOG 1.   Wt Readings from Last 3 Encounters:  02/08/24 244 lb (110.7 kg)  01/19/24 242 lb (109.8 kg)  12/29/23 240 lb (108.9 kg)    Medications:  Current Outpatient Medications:    amLODipine (NORVASC) 5 MG  tablet, TAKE 1 TABLET(5 MG) BY MOUTH DAILY, Disp: 90 tablet, Rfl: 1   busPIRone (BUSPAR) 15 MG tablet, Take 1 tablet (15 mg total) by mouth 3 (three) times daily., Disp: 270 tablet, Rfl: 0   cabergoline (DOSTINEX) 0.5 MG tablet, Take 0.5 tablets (0.25 mg total) by mouth 2 (two) times a week., Disp: 12 tablet, Rfl: 3   cholestyramine (QUESTRAN) 4 g packet, Take by mouth., Disp: , Rfl:    citalopram (CELEXA) 40 MG tablet, Take 1 tablet (40 mg total) by mouth daily., Disp: 90 tablet, Rfl: 1   diazepam (VALIUM) 5 MG tablet, Take 1 tab 30 minutes prior to enclosed imaging study., Disp: 4 tablet, Rfl: 1   dicyclomine (BENTYL) 20 MG tablet, TAKE 1 TABLET(20 MG) BY MOUTH THREE TIMES DAILY BEFORE MEALS, Disp: 90 tablet, Rfl: 4   diphenoxylate-atropine (LOMOTIL) 2.5-0.025 MG tablet, TAKE 2 TABLETS BY MOUTH FOUR TIMES DAILY AS NEEDED FOR DIARRHEA OR LOOSE STOOLS, Disp: 240 tablet, Rfl: 0   eszopiclone (LUNESTA) 2 MG TABS tablet, TAKE 1 TABLET(2 MG) BY MOUTH AT BEDTIME AS NEEDED FOR SLEEP, Disp: 30 tablet, Rfl: 0   famotidine (PEPCID) 20 MG tablet, TAKE 1 TABLET(20 MG) BY MOUTH TWICE DAILY, Disp: 60 tablet, Rfl: 0   fondaparinux (ARIXTRA) 10 MG/0.8ML SOLN injection, Inject 0.8 mLs (10 mg total) into the skin daily., Disp:  72 mL, Rfl: 3   glipiZIDE (GLUCOTROL) 5 MG tablet, Take 1 tablet (5 mg total) by mouth 2 (two) times daily before a meal., Disp: 180 tablet, Rfl: 3   hydrocortisone (ANUSOL-HC) 2.5 % rectal cream, PLACE RECTALLY 4 TIMES DAILY AS NEEDED FOR HEMORRHOIDS OR ANAL ITCHING, Disp: 30 g, Rfl: 1   HYDROmorphone (DILAUDID) 4 MG tablet, Take 1 tablet (4 mg total) by mouth every 6 (six) hours as needed for severe pain (pain score 7-10)., Disp: 60 tablet, Rfl: 0   lidocaine-hydrocortisone (ANAMANTLE) 3-1 % KIT, Place 1 Application rectally 2 (two) times daily., Disp: 1 kit, Rfl: 0   loperamide (IMODIUM) 2 MG capsule, Take 2 capsules (4 mg total) by mouth 4 (four) times daily as needed for diarrhea or loose  stools., Disp: , Rfl:    meclizine (ANTIVERT) 25 MG tablet, TAKE 1 TABLET(25 MG) BY MOUTH THREE TIMES DAILY AS NEEDED FOR DIZZINESS, Disp: 60 tablet, Rfl: 0   mirtazapine (REMERON) 7.5 MG tablet, TAKE 1 TABLET(7.5 MG) BY MOUTH AT BEDTIME, Disp: 30 tablet, Rfl: 0   nitrofurantoin, macrocrystal-monohydrate, (MACROBID) 100 MG capsule, Take 1 capsule (100 mg total) by mouth 2 (two) times daily., Disp: 14 capsule, Rfl: 0   nystatin-triamcinolone ointment (MYCOLOG), Apply 1 Application topically 2 (two) times daily. For up to 7 days and then STOP.  Repeat as needed for flares after at least a 2 week break, Disp: 30 g, Rfl: 0   ondansetron (ZOFRAN) 8 MG tablet, TAKE 1 TABLET(8 MG) BY MOUTH EVERY 8 HOURS AS NEEDED FOR NAUSEA OR VOMITING, Disp: 30 tablet, Rfl: 2   orphenadrine (NORFLEX) 100 MG tablet, TAKE 1 TABLET(100 MG) BY MOUTH AT BEDTIME AS NEEDED FOR MUSCLE SPASMS, Disp: 30 tablet, Rfl: 2   pantoprazole (PROTONIX) 40 MG tablet, TAKE 1 TABLET BY MOUTH EVERY DAY BEFORE BREAKFAST, Disp: 90 tablet, Rfl: 1   pramoxine-hydrocortisone (PROCTOCREAM-HC) 1-1 % rectal cream, APPLY RECTALLY TO THE AFFECTED AREA TWICE DAILY, Disp: 30 g, Rfl: 3   promethazine (PHENERGAN) 12.5 MG tablet, TAKE 1 TABLET(12.5 MG) BY MOUTH EVERY 6 HOURS AS NEEDED FOR NAUSEA OR VOMITING, Disp: 90 tablet, Rfl: 3   propranolol (INDERAL) 20 MG tablet, TAKE 1 TABLET(20 MG) BY MOUTH TWICE DAILY, Disp: 180 tablet, Rfl: 1   vancomycin (VANCOCIN) 125 MG capsule, 1 p.o. every 6 hours for 10 days followed by 1 p.o. twice daily for 7 days followed by 1 p.o. daily for 7 days followed by 1 p.o. every 2 days for 4 weeks, Disp: 73 capsule, Rfl: 0   potassium chloride (KLOR-CON M) 10 MEQ tablet, Take 2 tablets (20 mEq total) by mouth daily. (Patient not taking: Reported on 01/19/2024), Disp: 60 tablet, Rfl: 1 No current facility-administered medications for this visit.  Facility-Administered Medications Ordered in Other Visits:    sodium chloride flush  (NS) 0.9 % injection 10 mL, 10 mL, Intravenous, PRN, [name], MD, 10 mL at 02/08/24 1249  Allergies:  Allergies  Allergen Reactions   Ativan [Lorazepam] Swelling and Other (See Comments)    Face & Throat Swelling  Note: tolerates midazolam fine   Corticosteroids Other (See Comments)    "Psychotic behavior"    Penicillins Shortness Of Breath and Other (See Comments)    Irregular and rapid Heart Rate, too    Alprazolam Hives and Other (See Comments)    Hard to arouse, unresponsiveness also   Erythromycin Nausea And Vomiting        Gabapentin Other (See Comments)  Made the patient feel depressed   Prednisolone Anxiety   Prednisone Anxiety and Other (See Comments)    "Anxiety & Nervous Breakdown"   Savella  [Milnacipran] Other (See Comments)    Reaction not noted    Past Medical History, Surgical history, Social history, and Family History were reviewed and updated.  Review of Systems: Review of Systems  Constitutional:  Positive for fatigue and unexpected weight change.  HENT:  Negative.    Eyes: Negative.   Respiratory: Negative.    Cardiovascular: Negative.   Gastrointestinal:  Positive for abdominal pain and diarrhea.  Endocrine: Negative.   Genitourinary:  Positive for dysuria.   Musculoskeletal:  Positive for back pain.  Skin: Negative.   Neurological: Negative.   Hematological: Negative.   Psychiatric/Behavioral:  Negative for depression.     Physical Exam: Vitals:   02/08/24 1327  BP: 133/89  Pulse: 70  Resp: 18  Temp: 98 F (36.7 C)  SpO2: 96%   Wt Readings from Last 3 Encounters:  02/08/24 244 lb (110.7 kg)  01/19/24 242 lb (109.8 kg)  12/29/23 240 lb (108.9 kg)    Physical Exam Vitals reviewed.  HENT:     Head: Normocephalic and atraumatic.  Eyes:     Pupils: Pupils are equal, round, and reactive to light.  Cardiovascular:     Rate and Rhythm: Normal rate and regular rhythm.     Heart sounds: Normal heart sounds.  Pulmonary:      Effort: Pulmonary effort is normal.     Breath sounds: Normal breath sounds.  Abdominal:     General: Bowel sounds are normal.     Palpations: Abdomen is soft.  Musculoskeletal:        General: No tenderness or deformity. Normal range of motion.     Cervical back: Normal range of motion.  Lymphadenopathy:     Cervical: No cervical adenopathy.  Skin:    General: Skin is warm and dry.     Findings: No erythema or rash.  Neurological:     Mental Status: She is alert and oriented to person, place, and time.  Psychiatric:        Behavior: Behavior normal.        Thought Content: Thought content normal.        Judgment: Judgment normal.   Lab Results  Component Value Date   WBC 7.7 02/08/2024   HGB 12.0 02/08/2024   HCT 37.2 02/08/2024   MCV 96.9 02/08/2024   PLT 261 02/08/2024     Chemistry      Component Value Date/Time   NA 140 02/08/2024 1208   NA 137 04/24/2023 1437   K 3.9 02/08/2024 1208   CL 101 02/08/2024 1208   CO2 32 02/08/2024 1208   BUN 17 02/08/2024 1208   BUN 18 04/24/2023 1437   CREATININE 0.98 02/08/2024 1208   CREATININE 0.96 01/19/2024 1415      Component Value Date/Time   CALCIUM 8.8 (L) 02/08/2024 1208   ALKPHOS 148 (H) 02/08/2024 1208   AST 36 02/08/2024 1208   ALT 32 02/08/2024 1208   BILITOT 0.5 02/08/2024 1208      Impression and Plan: Ms. Lewers is a very charming 63 year-old white female. She has recurrent appendiceal cancer.  She underwent a HIPEC procedure in [place] a couple years ago. Unfortunately she had had chronic diarrhea since including C. Diff which has been successfully treatment.  She did have C. difficile.  She is on vancomycin taper..  She  will get her Somatuline today..  She is going to call Dr. Kerin Pebbles office and see about having a flex sig set up.  I do not see that we had to do any scans on her for right now.  I am glad that she will be set up with Dr. Arline Laity.  I know that he will do a fantastic job, if she actually  needed a colostomy.  We will plan to see her back in another month.    Ivor Mars, MD 4/17/20252:20 PM

## 2024-02-09 ENCOUNTER — Ambulatory Visit
Admission: RE | Admit: 2024-02-09 | Discharge: 2024-02-09 | Disposition: A | Discharge status: Home / Self Care | Source: Ambulatory Visit | Attending: Internal Medicine | Admitting: Internal Medicine

## 2024-02-09 DIAGNOSIS — D352 Benign neoplasm of pituitary gland: Secondary | ICD-10-CM

## 2024-02-09 MED ORDER — GADOPICLENOL 0.5 MMOL/ML IV SOLN
10.0000 mL | Freq: Once | INTRAVENOUS | Status: AC | PRN
  Administered 2024-02-09: 10 mL via INTRAVENOUS

## 2024-02-09 MED ORDER — SODIUM CHLORIDE 0.9% FLUSH
10.0000 mL | INTRAVENOUS | Status: DC | PRN
  Administered 2024-02-09: 10 mL via INTRAVENOUS

## 2024-02-09 MED ORDER — HEPARIN SOD (PORK) LOCK FLUSH 100 UNIT/ML IV SOLN: 500.0000 [IU] | Freq: Once | INTRAVENOUS | Status: DC

## 2024-02-10 LAB — URINE CULTURE: Culture: >=100000 — AB

## 2024-02-12 ENCOUNTER — Telehealth: Payer: Self-pay | Admitting: Internal Medicine

## 2024-02-12 ENCOUNTER — Other Ambulatory Visit: Payer: Self-pay

## 2024-02-12 ENCOUNTER — Telehealth: Payer: Self-pay

## 2024-02-12 DIAGNOSIS — E041 Nontoxic single thyroid nodule: Secondary | ICD-10-CM

## 2024-02-12 MED ORDER — NITROFURANTOIN MONOHYD MACRO 100 MG PO CAPS: 100.0000 mg | ORAL_CAPSULE | Freq: Two times a day (BID) | ORAL | 1 refills | Status: DC

## 2024-02-12 NOTE — Telephone Encounter (Signed)
-----   Message from Ivor Mars sent at 02/12/2024  6:51 AM EDT ----- Call and let her know that she has E. coli UTI again.  We need to get her on Macrobid  and 100 mg p.o. twice daily have her take this for 7 days and then have her take the Macrobid  just daily as a urinary suppressive agent.  Thanks.Aaron Aas

## 2024-02-12 NOTE — Telephone Encounter (Signed)
 Discussed thyroid  ultrasound results with the patient on/20 10/2023 at 1:30 PM    Due to change in morphology, FNA of nodule #1, right 1.6 cm nodule was recommended   All questions were answered    Abby Helayne Lo, MD  [facility]  [facility] [address] [place], [place] [address] Phone: [phone] FAX: [phone]

## 2024-02-12 NOTE — Telephone Encounter (Signed)
 Advised via MyChart.

## 2024-02-13 ENCOUNTER — Other Ambulatory Visit: Payer: Self-pay | Admitting: Behavioral Health

## 2024-02-13 ENCOUNTER — Encounter: Payer: Self-pay | Admitting: Behavioral Health

## 2024-02-13 ENCOUNTER — Telehealth: Payer: Medicare Other | Admitting: Behavioral Health

## 2024-02-13 DIAGNOSIS — F99 Mental disorder, not otherwise specified: Secondary | ICD-10-CM | POA: Diagnosis not present

## 2024-02-13 DIAGNOSIS — F5105 Insomnia due to other mental disorder: Secondary | ICD-10-CM | POA: Diagnosis not present

## 2024-02-13 MED ORDER — MIRTAZAPINE 15 MG PO TABS: 15.0000 mg | ORAL_TABLET | Freq: Every day | ORAL | 3 refills | Status: DC

## 2024-02-13 NOTE — Progress Notes (Signed)
 Wendy Barber [id number] 08-11-1961 63 y.o.  Virtual Visit via Video Note  I connected with pt @ on 02/13/24 at  2:30 PM EDT by a video enabled telemedicine application and verified that I am speaking with the correct person using two identifiers.   I discussed the limitations of evaluation and management by telemedicine and the availability of in person appointments. The patient expressed understanding and agreed to proceed.  I discussed the assessment and treatment plan with the patient. The patient was provided an opportunity to ask questions and all were answered. The patient agreed with the plan and demonstrated an understanding of the instructions.   The patient was advised to call back or seek an in-person evaluation if the symptoms worsen or if the condition fails to improve as anticipated.  I provided 30 minutes of non-face-to-face time during this encounter.  The patient was located at home.  The provider was located at [facility].   Lincoln Renshaw, NP   Subjective:   Patient ID:  Wendy Barber is a 63 y.o. (DOB 15-Dec-1960) female.  Chief Complaint:  Chief Complaint  Patient presents with   Depression   Anxiety   Follow-up   Medication Problem   Medication Refill   Patient Education   Stress    HPI 63 year old female presents to this office via video visit  for follow up and medication management.   Still facing a multitude of health issues. Currently has C-diff and on regimen of Vancomycin .  Feel "ok" mentally but sleep is still challenging. Wanting to know if we can increase her Mirtazapine . Requesting no med changes until she feels better. She understands that depression related to chronic disease is very difficult to treat. Reports her anxiety today at 3/10 and depression at 3/10. She is sleeping 5-6 hours per night. Her family remains highly supportive. She denies any mania, no psychosis, No current SI or HI.       No prior psychiatric medications       Review of Systems:  Review of Systems  Constitutional: Negative.   Allergic/Immunologic: Negative.   Neurological: Negative.   Psychiatric/Behavioral:  Positive for dysphoric mood.     Medications: I have reviewed the patient's current medications.  Current Outpatient Medications  Medication Sig Dispense Refill   nitrofurantoin , macrocrystal-monohydrate, (MACROBID ) 100 MG capsule Take 1 capsule (100 mg total) by mouth 2 (two) times daily. Take 1 capsule (100 mg total) by mouth 2 (two) times daily for 7 days for UTI. Then take 1 capsule daily as a urinary suppressive agent. 30 capsule 1   amLODipine  (NORVASC ) 5 MG tablet TAKE 1 TABLET(5 MG) BY MOUTH DAILY 90 tablet 1   busPIRone  (BUSPAR ) 15 MG tablet Take 1 tablet (15 mg total) by mouth 3 (three) times daily. 270 tablet 0   cabergoline  (DOSTINEX ) 0.5 MG tablet Take 0.5 tablets (0.25 mg total) by mouth 2 (two) times a week. 12 tablet 3   cholestyramine (QUESTRAN) 4 g packet Take by mouth.     citalopram  (CELEXA ) 40 MG tablet Take 1 tablet (40 mg total) by mouth daily. 90 tablet 1   diazepam  (VALIUM ) 5 MG tablet Take 1 tab 30 minutes prior to enclosed imaging study. 4 tablet 1   dicyclomine  (BENTYL ) 20 MG tablet TAKE 1 TABLET(20 MG) BY MOUTH THREE TIMES DAILY BEFORE MEALS 90 tablet 4   diphenoxylate -atropine  (LOMOTIL ) 2.5-0.025 MG tablet TAKE 2 TABLETS BY MOUTH FOUR TIMES DAILY AS NEEDED FOR DIARRHEA OR LOOSE STOOLS 240 tablet  0   eszopiclone  (LUNESTA ) 2 MG TABS tablet TAKE 1 TABLET(2 MG) BY MOUTH AT BEDTIME AS NEEDED FOR SLEEP 30 tablet 0   famotidine  (PEPCID ) 20 MG tablet TAKE 1 TABLET(20 MG) BY MOUTH TWICE DAILY 60 tablet 0   fondaparinux  (ARIXTRA ) 10 MG/0.8ML SOLN injection Inject 0.8 mLs (10 mg total) into the skin daily. 72 mL 3   glipiZIDE  (GLUCOTROL ) 5 MG tablet Take 1 tablet (5 mg total) by mouth 2 (two) times daily before a meal. 180 tablet 3   hydrocortisone  (ANUSOL -HC) 2.5 % rectal cream PLACE RECTALLY 4 TIMES DAILY AS NEEDED  FOR HEMORRHOIDS OR ANAL ITCHING 30 g 1   HYDROmorphone  (DILAUDID ) 4 MG tablet Take 1 tablet (4 mg total) by mouth every 6 (six) hours as needed for severe pain (pain score 7-10). 60 tablet 0   lidocaine -hydrocortisone  (ANAMANTLE) 3-1 % KIT Place 1 Application rectally 2 (two) times daily. 1 kit 0   loperamide  (IMODIUM ) 2 MG capsule Take 2 capsules (4 mg total) by mouth 4 (four) times daily as needed for diarrhea or loose stools.     meclizine  (ANTIVERT ) 25 MG tablet TAKE 1 TABLET(25 MG) BY MOUTH THREE TIMES DAILY AS NEEDED FOR DIZZINESS 60 tablet 0   nitrofurantoin , macrocrystal-monohydrate, (MACROBID ) 100 MG capsule Take 1 capsule (100 mg total) by mouth 2 (two) times daily. 14 capsule 0   nystatin -triamcinolone  ointment (MYCOLOG) Apply 1 Application topically 2 (two) times daily. For up to 7 days and then STOP.  Repeat as needed for flares after at least a 2 week break 30 g 0   ondansetron  (ZOFRAN ) 8 MG tablet TAKE 1 TABLET(8 MG) BY MOUTH EVERY 8 HOURS AS NEEDED FOR NAUSEA OR VOMITING 30 tablet 2   orphenadrine  (NORFLEX ) 100 MG tablet TAKE 1 TABLET(100 MG) BY MOUTH AT BEDTIME AS NEEDED FOR MUSCLE SPASMS 30 tablet 2   pantoprazole  (PROTONIX ) 40 MG tablet TAKE 1 TABLET BY MOUTH EVERY DAY BEFORE BREAKFAST 90 tablet 1   potassium chloride  (KLOR-CON  M) 10 MEQ tablet Take 2 tablets (20 mEq total) by mouth daily. (Patient not taking: Reported on 01/19/2024) 60 tablet 1   pramoxine-hydrocortisone  (PROCTOCREAM-HC) 1-1 % rectal cream APPLY RECTALLY TO THE AFFECTED AREA TWICE DAILY 30 g 3   promethazine  (PHENERGAN ) 12.5 MG tablet TAKE 1 TABLET(12.5 MG) BY MOUTH EVERY 6 HOURS AS NEEDED FOR NAUSEA OR VOMITING 90 tablet 3   propranolol  (INDERAL ) 20 MG tablet TAKE 1 TABLET(20 MG) BY MOUTH TWICE DAILY 180 tablet 1   vancomycin  (VANCOCIN ) 125 MG capsule 1 p.o. every 6 hours for 10 days followed by 1 p.o. twice daily for 7 days followed by 1 p.o. daily for 7 days followed by 1 p.o. every 2 days for 4 weeks 73 capsule 0    No current facility-administered medications for this visit.    Medication Side Effects: None  Allergies:  Allergies  Allergen Reactions   Ativan [Lorazepam] Swelling and Other (See Comments)    Face & Throat Swelling  Note: tolerates midazolam  fine   Corticosteroids Other (See Comments)    "Psychotic behavior"    Penicillins Shortness Of Breath and Other (See Comments)    Irregular and rapid Heart Rate, too    Alprazolam Hives and Other (See Comments)    Hard to arouse, unresponsiveness also   Erythromycin Nausea And Vomiting        Gabapentin  Other (See Comments)    Made the patient feel depressed   Prednisolone Anxiety   Prednisone Anxiety and Other (  See Comments)    "Anxiety & Nervous Breakdown"   Savella  [Milnacipran] Other (See Comments)    Reaction not noted    Past Medical History:  Diagnosis Date   Arthritis    Back pain    Cancer (HCC)    pseudomyxoma peritonei   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   Chronic fatigue syndrome    Diabetes mellitus without complication (HCC)    DVT of deep femoral vein, left (HCC) 03/19/2020   Fibromyalgia    Goals of care, counseling/discussion 03/19/2020   Hypertension    Iron deficiency anemia due to chronic blood loss 05/14/2021   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   Pernicious anemia 05/14/2021   Presence of IVC filter 03/19/2020   Pulmonary embolism, bilateral (HCC) 03/19/2020   Short bowel syndrome, unspecified     Family History  Problem Relation Age of Onset   Depression Mother    Drug abuse Sister    Suicidality Sister    Alcohol abuse Brother    Drug abuse Brother     Social History   Socioeconomic History   Marital status: Married    Spouse name: Not on file   Number of children: 3   Years of education: Not on file   Highest education level: Some college, no degree  Occupational History   Occupation: disability  Tobacco Use   Smoking  status: Never   Smokeless tobacco: Never  Vaping Use   Vaping status: Never Used  Substance and Sexual Activity   Alcohol use: No   Drug use: Never   Sexual activity: Yes    Birth control/protection: None    Comment: hysterectomy  Other Topics Concern   Not on file  Social History Narrative   Right handed   One story home   Drinks occasional caffeine   Social Drivers of Health   Financial Resource Strain: Patient Declined (10/16/2023)   Overall Financial Resource Strain (CARDIA)    Difficulty of Paying Living Expenses: Patient declined  Food Insecurity: No Food Insecurity (10/16/2023)   Hunger Vital Sign    Worried About Running Out of Food in the Last Year: Never true    Ran Out of Food in the Last Year: Never true  Transportation Needs: No Transportation Needs (10/16/2023)   PRAPARE - Administrator, Civil Service (Medical): No    Lack of Transportation (Non-Medical): No  Physical Activity: Inactive (10/16/2023)   Exercise Vital Sign    Days of Exercise per Week: 0 days    Minutes of Exercise per Session: 20 min  Stress: Stress Concern Present (10/16/2023)   [employer] of Occupational Health - Occupational Stress Questionnaire    Feeling of Stress : Very much  Social Connections: Socially Integrated (10/16/2023)   Social Connection and Isolation Panel [NHANES]    Frequency of Communication with Friends and Family: More than three times a week    Frequency of Social Gatherings with Friends and Family: Once a week    Attends Religious Services: More than 4 times per year    Active Member of [employer] or Organizations: No    Attends Engineer, structural: More than 4 times per year    Marital Status: Married  Catering manager Violence: Not At Risk (03/09/2023)   Humiliation, Afraid, Rape, and Kick questionnaire    Fear of Current or Ex-Partner: No    Emotionally Abused: No    Physically Abused: No    Sexually Abused:  No    Past Medical History,  Surgical history, Social history, and Family history were reviewed and updated as appropriate.   Please see review of systems for further details on the patient's review from today.   Objective:   Physical Exam:  There were no vitals taken for this visit.  Physical Exam Neurological:     Mental Status: She is alert and oriented to person, place, and time.  Psychiatric:        Attention and Perception: Attention and perception normal.        Mood and Affect: Mood normal.        Speech: Speech normal.        Behavior: Behavior normal. Behavior is cooperative.        Cognition and Memory: Cognition and memory normal.        Judgment: Judgment normal.     Comments: Insight intact     Lab Review:     Component Value Date/Time   NA 140 02/08/2024 1208   NA 137 04/24/2023 1437   K 3.9 02/08/2024 1208   CL 101 02/08/2024 1208   CO2 32 02/08/2024 1208   GLUCOSE 227 (H) 02/08/2024 1208   BUN 17 02/08/2024 1208   BUN 18 04/24/2023 1437   CREATININE 0.98 02/08/2024 1208   CREATININE 0.96 01/19/2024 1415   CALCIUM  8.8 (L) 02/08/2024 1208   PROT 6.6 02/08/2024 1208   ALBUMIN 3.7 02/08/2024 1208   AST 36 02/08/2024 1208   ALT 32 02/08/2024 1208   ALKPHOS 148 (H) 02/08/2024 1208   BILITOT 0.5 02/08/2024 1208   GFRNONAA >60 02/08/2024 1208   GFRAA >60 07/27/2020 0922       Component Value Date/Time   WBC 7.7 02/08/2024 1208   WBC 9.5 01/23/2024 1253   RBC 3.84 (L) 02/08/2024 1208   HGB 12.0 02/08/2024 1208   HCT 37.2 02/08/2024 1208   PLT 261 02/08/2024 1208   MCV 96.9 02/08/2024 1208   MCH 31.3 02/08/2024 1208   MCHC 32.3 02/08/2024 1208   RDW 14.3 02/08/2024 1208   LYMPHSABS 2.9 02/08/2024 1208   MONOABS 0.7 02/08/2024 1208   EOSABS 0.2 02/08/2024 1208   BASOSABS 0.1 02/08/2024 1208    No results found for: "POCLITH", "LITHIUM"   No results found for: "PHENYTOIN", "PHENOBARB", "VALPROATE", "CBMZ"   .res Assessment: Plan:    Greater than 50% of 30 min video  visit with patient was spent on counseling and coordination of care. No social changes this visit.  Currently dx with C-diff and on ABX.  Only requesting increase of Mirtazapine  for sleep this visit.  Multiple pending follow ups with specialist pending.  We agreed to:  Continue Buspar   to 15 mg to  three times daily as needed. Continue Celexa  to 40 mg daily. Will take with food to help with nausea. Could make diarrhea worse in the beginning but may pass after a week or two.  To Continue Lunesta  5 mg at betime. Belsomra  was not approved by insurance. She is happy with Lunesta  but says causes metallic taste in mouth next day.  To increase Mirtazapine  to 15 mg daily at bedtime for sleep.  Will report worsening symptoms or side effects promptly  To follow up in 4  weeks to reassess Will continue to follow up with PCP regularly Provided emergency contact information Reviewed PDMP          Mana was seen today for depression, anxiety, follow-up, medication problem, medication refill, patient education and  stress.  Diagnoses and all orders for this visit:  Insomnia due to other mental disorder     Please see After Visit Summary for patient specific instructions.  Future Appointments  Date Time Provider Department Center  02/19/2024  2:00 PM Orlie Bjornstad, MD [facility]  03/13/2024  8:45 AM [facility] LAB [facility] None  03/13/2024  9:00 AM [facility] INJ NURSE [facility] None  03/13/2024  9:15 AM Ennever, Sherryll Donald, MD [facility] None  03/13/2024  9:30 AM [facility] INJ NURSE [facility] None  04/10/2024 12:00 PM [facility] None  04/10/2024 12:15 PM [facility] INJ NURSE [facility] None  04/10/2024 12:30 PM Ennever, Sherryll Donald, MD [facility] None  04/10/2024  1:00 PM [facility] INJ NURSE [facility] None  05/20/2024 12:10 PM [name], Julian Obey, MD [facility] None  07/08/2024  1:30 PM Ronelle Coffee, MD [facility] None    No orders of the defined types were placed in this encounter.      -------------------------------

## 2024-02-15 ENCOUNTER — Telehealth: Payer: Self-pay | Admitting: Internal Medicine

## 2024-02-15 NOTE — Telephone Encounter (Signed)
 VM left for the medical records department to added report to MRI exam

## 2024-02-15 NOTE — Telephone Encounter (Signed)
 Lynette from [facility] imaging called back imaging has not been dictated and will place request to prioritize it.

## 2024-02-15 NOTE — Telephone Encounter (Signed)
 Can you please contact [facility] on [address] and asked them to attach radiology report to her MRI?Wendy Barber    It shows that the MRI test has ended and the images are there but there is no report, thanks

## 2024-02-16 ENCOUNTER — Encounter: Payer: Self-pay | Admitting: Internal Medicine

## 2024-02-17 ENCOUNTER — Other Ambulatory Visit: Payer: Self-pay | Admitting: Behavioral Health

## 2024-02-17 DIAGNOSIS — F411 Generalized anxiety disorder: Secondary | ICD-10-CM

## 2024-02-17 DIAGNOSIS — F331 Major depressive disorder, recurrent, moderate: Secondary | ICD-10-CM

## 2024-02-19 ENCOUNTER — Telehealth: Payer: Self-pay | Admitting: Internal Medicine

## 2024-02-19 ENCOUNTER — Ambulatory Visit: Admitting: Internal Medicine

## 2024-02-19 NOTE — Telephone Encounter (Signed)
 Patient's spouse called to follow up on MRI imaging results. Did communicate that I did see where we have spoke with them previously and we are continuing to follow up on this matter. He understood, and I stated that I would communicate this on over as well.

## 2024-02-19 NOTE — Telephone Encounter (Signed)
 I call over to request read asap again at the reading room.

## 2024-02-20 ENCOUNTER — Encounter: Payer: Self-pay | Admitting: Family Medicine

## 2024-02-20 ENCOUNTER — Other Ambulatory Visit: Payer: Self-pay | Admitting: Hematology & Oncology

## 2024-02-20 ENCOUNTER — Encounter: Payer: Self-pay | Admitting: Internal Medicine

## 2024-02-20 DIAGNOSIS — D320 Benign neoplasm of cerebral meninges: Secondary | ICD-10-CM

## 2024-02-20 DIAGNOSIS — Z85038 Personal history of other malignant neoplasm of large intestine: Secondary | ICD-10-CM

## 2024-02-20 DIAGNOSIS — A0472 Enterocolitis due to Clostridium difficile, not specified as recurrent: Secondary | ICD-10-CM

## 2024-02-20 DIAGNOSIS — C181 Malignant neoplasm of appendix: Secondary | ICD-10-CM

## 2024-02-21 ENCOUNTER — Encounter: Payer: Self-pay | Admitting: Hematology & Oncology

## 2024-02-21 NOTE — Telephone Encounter (Signed)
"  The MRI shows stable pituitary tumor but it appears that the meningioma continues to increase in size, please follow-up with the neurologist regarding meningioma follow-up. " Dr Rosalea Collin

## 2024-02-24 ENCOUNTER — Other Ambulatory Visit: Payer: Self-pay | Admitting: Hematology & Oncology

## 2024-02-24 DIAGNOSIS — H8112 Benign paroxysmal vertigo, left ear: Secondary | ICD-10-CM

## 2024-02-27 ENCOUNTER — Other Ambulatory Visit (HOSPITAL_COMMUNITY)
Admission: RE | Admit: 2024-02-27 | Discharge: 2024-02-27 | Disposition: A | Discharge status: Home / Self Care | Source: Ambulatory Visit | Attending: Internal Medicine | Admitting: Internal Medicine

## 2024-02-27 ENCOUNTER — Ambulatory Visit
Admission: RE | Admit: 2024-02-27 | Discharge: 2024-02-27 | Disposition: A | Discharge status: Home / Self Care | Source: Ambulatory Visit | Attending: Internal Medicine | Admitting: Internal Medicine

## 2024-02-27 DIAGNOSIS — E041 Nontoxic single thyroid nodule: Secondary | ICD-10-CM | POA: Insufficient documentation

## 2024-02-28 ENCOUNTER — Other Ambulatory Visit: Payer: Self-pay

## 2024-02-28 ENCOUNTER — Encounter: Payer: Self-pay | Admitting: Family

## 2024-02-28 ENCOUNTER — Ambulatory Visit (INDEPENDENT_AMBULATORY_CARE_PROVIDER_SITE_OTHER): Admitting: Family

## 2024-02-28 VITALS — BP 127/84 | HR 77 | Temp 98.4°F | Ht 66.0 in | Wt 240.0 lb

## 2024-02-28 DIAGNOSIS — Z221 Carrier of other intestinal infectious diseases: Secondary | ICD-10-CM

## 2024-02-28 DIAGNOSIS — K529 Noninfective gastroenteritis and colitis, unspecified: Secondary | ICD-10-CM

## 2024-02-28 NOTE — Progress Notes (Unsigned)
 Subjective:    Patient ID: Kinsie Fought, female    DOB: February 05, 1961, 63 y.o.   MRN: [id number]  No chief complaint on file.   HPI:  Caylan Patino is a 63 y.o. female with previous medical history of disseminated peritoneal adenomucinosis, CRS/HIPEC in 2005 and February 2022 with major resection including the small bowel, proximal jejunum, ascending colon, partial cystectomy; hypertension, diabetes, and short-bowel syndrome presenting with evaluation of infectious diarrhea and concern for C. difficile colitis.  Ms. Diefenbach began having chronic diarrhea in February 2022 with her second abdominal surgery with chemotherapy experiencing watery stools with frequency of 15-20 times per day and since that time continues to have approximately 10-12 bowel movements per day with a stool consistency of Bristol scale 6 or 7.  Diarrhea has been refractory to multiple antidiarrheal agents including Lomotil , Bentyl , Imodium , and tincture of iodine.  Consistency of stools can include oily and foaminess at times.  Has been previously tested for C. difficile colitis and respective treatments as below.  C. difficile testing 01/16/2021 positive antigen and toxigenic PCR; negative toxin 07/05/2022 positive antigen and toxigenic PCR; negative toxin 07/24/2022 -all testing negative 10/12/2022 positive antigen and toxigenic PCR and negative toxin 01/24/2024 positive PCR  C. Difficile treatment: 01/16/2021 oral vancomycin  4 times daily for 40 doses 01/23/2021 fidaxomicin  2 times daily for 14 days 01/23/2021 vancomycin  oral 4 times daily for 26 doses 02/10/2021 oral vancomycin  125 mg every 12 hours for 15 days prophylactic while completing antibiotics 07/06/2022 for fidaxomicin  200 mg every 12 hours for 15 days 01/26/2024 oral vancomycin  taper 1 p.o. every 6 hours for 10 days followed by 1 p.o. twice daily for 7 days followed by 1 p.o. daily for 7 days followed by 1 p.o. every 2 days for 4 weeks    The most recent testing  was on 01/24/24 with a positive PCR. She has continued to have a frequency of 10-12 bowl movements with good days and bad days. Had noted a worsening smell and some abdominal pain leading to her most recent testing. ID provider on call was consulted and she was started on oral vancomycin  taper.   Mr. Vangelder has been taking the vancomycin  as prescribed with no adverse side effects and has not seen any significant improvements in her diarrhea symptoms. She has had a few UTI.  February 2022- second abdominal surgery with chemo chronic diarrhea following surgery - watery stools with bowel frequency 15-20 times per day; and continues to go 10-12 times per day with Stool conistenty of Bristol 6-7. Lomitil, bentyl , immodium, tinture of iodine; less than 1/2 her bowel removing; oily and foamy; Having more diarrhea and the smell got worse; some abdominal pain; changes occasional; 4 UTI in the last 3 - macrobid , Not improved from where he gets over it and urgency; pain in the left flank area and in the pelvic area.   Diarrhea with colitis -- Watery diarrhea (>=3 loose stools in 24 hours) is the cardinal symptom of CDI. Other manifestations include lower abdominal pain and cramping, low-grade fever, nausea, and anorexia [2,10]. Diarrhea may be associated with mucus or occult blood, but melena or hematochezia are rare. Fever is associated with CDI in approximately 15 percent of cases; temperature >38.5C may occur in the setting of nonsevere or severe CDI. Allergies  Allergen Reactions   Ativan [Lorazepam] Swelling and Other (See Comments)    Face & Throat Swelling  Note: tolerates midazolam  fine   Corticosteroids Other (See Comments)    "Psychotic behavior"  Penicillins Shortness Of Breath and Other (See Comments)    Irregular and rapid Heart Rate, too    Alprazolam Hives and Other (See Comments)    Hard to arouse, unresponsiveness also   Erythromycin Nausea And Vomiting        Gabapentin  Other (See  Comments)    Made the patient feel depressed   Prednisolone Anxiety   Prednisone Anxiety and Other (See Comments)    "Anxiety & Nervous Breakdown"   Savella  [Milnacipran] Other (See Comments)    Reaction not noted      Outpatient Medications Prior to Visit  Medication Sig Dispense Refill   citalopram  (CELEXA ) 40 MG tablet TAKE 1 TABLET(40 MG) BY MOUTH DAILY 90 tablet 0   nitrofurantoin , macrocrystal-monohydrate, (MACROBID ) 100 MG capsule Take 1 capsule (100 mg total) by mouth 2 (two) times daily. Take 1 capsule (100 mg total) by mouth 2 (two) times daily for 7 days for UTI. Then take 1 capsule daily as a urinary suppressive agent. 30 capsule 1   amLODipine  (NORVASC ) 5 MG tablet TAKE 1 TABLET(5 MG) BY MOUTH DAILY 90 tablet 1   busPIRone  (BUSPAR ) 15 MG tablet Take 1 tablet (15 mg total) by mouth 3 (three) times daily. 270 tablet 0   cabergoline  (DOSTINEX ) 0.5 MG tablet Take 0.5 tablets (0.25 mg total) by mouth 2 (two) times a week. 12 tablet 3   cholestyramine (QUESTRAN) 4 g packet Take by mouth.     diazepam  (VALIUM ) 5 MG tablet Take 1 tab 30 minutes prior to enclosed imaging study. 4 tablet 1   dicyclomine  (BENTYL ) 20 MG tablet TAKE 1 TABLET(20 MG) BY MOUTH THREE TIMES DAILY BEFORE MEALS 90 tablet 4   diphenoxylate -atropine  (LOMOTIL ) 2.5-0.025 MG tablet TAKE 2 TABLETS BY MOUTH FOUR TIMES DAILY AS NEEDED FOR DIARRHEA OR LOOSE STOOLS 240 tablet 2   eszopiclone  (LUNESTA ) 2 MG TABS tablet TAKE 1 TABLET(2 MG) BY MOUTH AT BEDTIME AS NEEDED FOR SLEEP. 30 tablet 0   famotidine  (PEPCID ) 20 MG tablet TAKE 1 TABLET(20 MG) BY MOUTH TWICE DAILY 60 tablet 0   fondaparinux  (ARIXTRA ) 10 MG/0.8ML SOLN injection Inject 0.8 mLs (10 mg total) into the skin daily. 72 mL 3   glipiZIDE  (GLUCOTROL ) 5 MG tablet Take 1 tablet (5 mg total) by mouth 2 (two) times daily before a meal. 180 tablet 3   hydrocortisone  (ANUSOL -HC) 2.5 % rectal cream PLACE RECTALLY 4 TIMES DAILY AS NEEDED FOR HEMORRHOIDS OR ANAL ITCHING 30  g 1   HYDROmorphone  (DILAUDID ) 4 MG tablet Take 1 tablet (4 mg total) by mouth every 6 (six) hours as needed for severe pain (pain score 7-10). 60 tablet 0   lidocaine -hydrocortisone  (ANAMANTLE) 3-1 % KIT Place 1 Application rectally 2 (two) times daily. 1 kit 0   loperamide  (IMODIUM ) 2 MG capsule Take 2 capsules (4 mg total) by mouth 4 (four) times daily as needed for diarrhea or loose stools.     meclizine  (ANTIVERT ) 25 MG tablet TAKE 1 TABLET(25 MG) BY MOUTH THREE TIMES DAILY AS NEEDED FOR DIZZINESS 60 tablet 0   mirtazapine  (REMERON ) 15 MG tablet Take 1 tablet (15 mg total) by mouth at bedtime. 30 tablet 3   nitrofurantoin , macrocrystal-monohydrate, (MACROBID ) 100 MG capsule Take 1 capsule (100 mg total) by mouth 2 (two) times daily. 14 capsule 0   nystatin -triamcinolone  ointment (MYCOLOG) Apply 1 Application topically 2 (two) times daily. For up to 7 days and then STOP.  Repeat as needed for flares after at least a 2  week break 30 g 0   ondansetron  (ZOFRAN ) 8 MG tablet TAKE 1 TABLET(8 MG) BY MOUTH EVERY 8 HOURS AS NEEDED FOR NAUSEA OR VOMITING 30 tablet 2   orphenadrine  (NORFLEX ) 100 MG tablet TAKE 1 TABLET(100 MG) BY MOUTH AT BEDTIME AS NEEDED FOR MUSCLE SPASMS 30 tablet 2   pantoprazole  (PROTONIX ) 40 MG tablet TAKE 1 TABLET BY MOUTH EVERY DAY BEFORE BREAKFAST 90 tablet 1   potassium chloride  (KLOR-CON  M) 10 MEQ tablet Take 2 tablets (20 mEq total) by mouth daily. (Patient not taking: Reported on 01/19/2024) 60 tablet 1   pramoxine-hydrocortisone  (PROCTOCREAM-HC) 1-1 % rectal cream APPLY RECTALLY TO THE AFFECTED AREA TWICE DAILY 30 g 3   promethazine  (PHENERGAN ) 12.5 MG tablet TAKE 1 TABLET(12.5 MG) BY MOUTH EVERY 6 HOURS AS NEEDED FOR NAUSEA OR VOMITING 90 tablet 3   propranolol  (INDERAL ) 20 MG tablet TAKE 1 TABLET(20 MG) BY MOUTH TWICE DAILY 180 tablet 1   vancomycin  (VANCOCIN ) 125 MG capsule 1 p.o. every 6 hours for 10 days followed by 1 p.o. twice daily for 7 days followed by 1 p.o. daily for  7 days followed by 1 p.o. every 2 days for 4 weeks 73 capsule 0   No facility-administered medications prior to visit.     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   Pernicious anemia 05/14/2021   Presence of IVC filter 03/19/2020   Pulmonary embolism, bilateral (HCC) 03/19/2020   Short bowel syndrome, unspecified      Past Surgical History:  Procedure Laterality Date   ABDOMINAL SURGERY  2005   cytoreductive surgery with splenectomy, [facility]C   ABDOMINAL SURGERY  2022   ACHILLES TENDON REPAIR     ARTHROPLASTY     CARPAL TUNNEL RELEASE     CHOLECYSTECTOMY  07/1994   IR CATHETER TUBE CHANGE  02/02/2021   IR CATHETER TUBE CHANGE  02/25/2021   IR IMAGING GUIDED PORT INSERTION  06/20/2022   IR RADIOLOGIST EVAL & MGMT  02/24/2021   IR RADIOLOGIST EVAL & MGMT  03/10/2021   IR RADIOLOGIST EVAL & MGMT  06/22/2022   IR THROMBECT VENO MECH MOD SED  06/20/2022   IR US  GUIDE BX ASP/DRAIN  11/25/2019   IR US  GUIDE VASC ACCESS LEFT  06/20/2022   IR US  GUIDE VASC ACCESS RIGHT  06/20/2022   IR US  GUIDE VASC ACCESS RIGHT  06/20/2022   IR VENO/EXT/BI  06/20/2022   IR VENOCAVAGRAM IVC  06/20/2022   JOINT REPLACEMENT Bilateral    knees   KNEE ARTHROSCOPY     ORIF ANKLE FRACTURE Right 08/27/2019   Procedure: OPEN REDUCTION INTERNAL FIXATION RIGHT ANKLE FRACTURE;  Surgeon: Saundra Curl, MD;  Location: [facility];  Service: Orthopedics;  Laterality: Right;   perineorrophy     TONGUE BIOPSY     TOTAL ABDOMINAL HYSTERECTOMY  Bilateral 2004   with BSO and appendectomy       Review of Systems    Objective:    There were no vitals taken  for this visit. Nursing note and vital signs reviewed.  Physical Exam      01/04/2024   10:48 AM 12/12/2023    4:47 PM 04/17/2023    2:11 PM 03/09/2023   11:47 AM 12/06/2022    9:51 AM  Depression screen PHQ 2/9  Decreased Interest 0 0 3 3 3   Down, Depressed, Hopeless 0 0 3 3 3   PHQ - 2 Score 0 0 6 6 6   Altered sleeping 2  3 3 3   Tired, decreased energy 1  3 3 3   Change in appetite 1  3 1 3   Feeling bad or failure about yourself  1  3 3 3   Trouble concentrating 0  2 3 3   Moving slowly or fidgety/restless 0  2 3 3   Suicidal thoughts 0  2 0 0  PHQ-9 Score 5  24 22 24   Difficult doing work/chores    Very difficult        Assessment & Plan:    Patient Active Problem List   Diagnosis Date Noted   Hyperprolactinemia (HCC) 06/23/2023   Pituitary macroadenoma (HCC) 06/23/2023   Pituitary adenoma (HCC) 04/17/2023   Pulmonary emboli (HCC) 12/08/2022   Pulmonary embolism (HCC) 12/07/2022   Thyroid  nodule 12/07/2022   Enteritis 07/23/2022   Fall at home, initial encounter 07/23/2022   AMS (altered mental status) 07/23/2022   Hemorrhoids 06/25/2022   Occasional tremors: Essential tremors 06/23/2022   Hypomagnesemia 06/22/2022   Iron deficiency anemia    Gastritis 06/18/2022   Depression 06/18/2022   UTI (urinary tract infection) 06/16/2022   DVT, bilateral lower limbs (HCC) 06/14/2022   Symptomatic bradycardia 06/13/2022   History of excision of intestinal structure 06/13/2022   CAD (coronary artery disease) 06/13/2022   Acute lower GI bleeding 05/04/2022   AKI (acute kidney injury) (HCC) 05/04/2022   History of deep vein thrombosis (DVT) of lower extremity 05/04/2022   History of pulmonary embolus (PE) 05/04/2022   Anxiety 05/04/2022   Pernicious anemia 05/14/2021   Iron deficiency anemia due to chronic blood loss 05/14/2021   Abdominal wall abscess     Surgical wound infection 01/16/2021   C. 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   DVT of deep femoral vein, left (HCC) 03/19/2020   Pulmonary embolism, bilateral (HCC) 03/19/2020   Presence of IVC filter 03/19/2020   Hepatic encephalopathy (HCC) 11/22/2019   Confusion 11/21/2019   Autoimmune hepatitis (HCC) 11/21/2019   Uncontrolled hypertension 11/21/2019   DM2 (diabetes mellitus, type 2) (HCC) 11/21/2019   Closed right ankle fracture 08/27/2019   Acute deep vein thrombosis (DVT) of femoral vein of left lower extremity (HCC) 08/03/2018   History of colon cancer 08/03/2018   History of partial colectomy 08/03/2018   Spinal stenosis 08/03/2018   Morbid obesity (HCC) 08/03/2018   Cerebral meningioma (HCC) 01/05/2018   Arthritis of right shoulder region 02/18/2017   History of renal calculi 02/14/2016   Midline low back pain 02/14/2016   Class 3 obesity 01/07/2016   Migraine with aura and without status migrainosus, not intractable 01/07/2016   Chronic fatigue syndrome 09/23/2013   GERD (gastroesophageal reflux disease) 09/23/2013   History of hepatitis A 09/23/2013   Rheumatoid arthritis involving multiple sites (HCC) 09/23/2013     Problem List Items Addressed This Visit   None  I am having Beatris Bough maintain her loperamide , potassium chloride , nystatin -triamcinolone  ointment, fondaparinux , hydrocortisone , propranolol , pramoxine-hydrocortisone , amLODipine , pantoprazole , nitrofurantoin  (macrocrystal-monohydrate), busPIRone , lidocaine -hydrocortisone , famotidine , promethazine , dicyclomine , cholestyramine, glipiZIDE , diazepam , cabergoline , vancomycin , orphenadrine , ondansetron , HYDROmorphone , nitrofurantoin  (macrocrystal-monohydrate), eszopiclone ,  mirtazapine , citalopram , diphenoxylate -atropine , and meclizine .   No orders of the defined types were placed in this encounter.    Follow-up: No follow-ups on file. or sooner if needed.   Marlan Silva, MSN, FNP-C Nurse Practitioner [facility] Main number: [phone]

## 2024-02-28 NOTE — Patient Instructions (Signed)
 Nice to see you.  Continue to take your medication daily as prescribed and finish vancomycin .  Follow up with ID as needed.   Have a great day and stay safe!

## 2024-02-29 ENCOUNTER — Other Ambulatory Visit: Payer: Self-pay | Admitting: Hematology & Oncology

## 2024-02-29 ENCOUNTER — Encounter: Payer: Self-pay | Admitting: Family

## 2024-02-29 DIAGNOSIS — K529 Noninfective gastroenteritis and colitis, unspecified: Secondary | ICD-10-CM | POA: Insufficient documentation

## 2024-02-29 DIAGNOSIS — Z221 Carrier of other intestinal infectious diseases: Secondary | ICD-10-CM | POA: Insufficient documentation

## 2024-02-29 LAB — CYTOLOGY - NON PAP

## 2024-02-29 NOTE — Assessment & Plan Note (Signed)
 Wendy Barber is a 63 y/o female with chronic diarrhea s/p February 2022 CRS/HIPEC surgery and major resections. She has tested positive for C. Difficile with antigen and PCR and negative for toxin on several occasions with most recent being 01/24/24 (just PCR). It appears that her diarrhea is more functional than infectious and likely is a C. Difficile carrier or is colonized given the negative toxin and lack of expected response from tapered dose of oral vancomycin . She continues on mulitple antidiarrheal medications with minimal improvements having 10-12 bowel movements per day (close to baseline recently).  Following discussion she will complete the remaining vancomycin  taper as prescribed although informed that I would suspect little improvement if any. Recommendation for follow up with Gastroenterology / Surgery for any additional interventions that may help with her symptoms and to limit antibiotic use as much as possible as she is at risk for C. Difficile colitis flair. Happy to see her back in ID as needed and advised to contact us  with any additional questions or concerns.

## 2024-03-01 ENCOUNTER — Encounter: Payer: Self-pay | Admitting: Internal Medicine

## 2024-03-03 ENCOUNTER — Encounter: Payer: Self-pay | Admitting: Hematology & Oncology

## 2024-03-06 ENCOUNTER — Encounter (HOSPITAL_BASED_OUTPATIENT_CLINIC_OR_DEPARTMENT_OTHER): Payer: Self-pay | Admitting: Obstetrics & Gynecology

## 2024-03-06 ENCOUNTER — Ambulatory Visit (HOSPITAL_BASED_OUTPATIENT_CLINIC_OR_DEPARTMENT_OTHER): Admitting: Obstetrics & Gynecology

## 2024-03-06 ENCOUNTER — Other Ambulatory Visit (HOSPITAL_COMMUNITY)
Admission: RE | Admit: 2024-03-06 | Discharge: 2024-03-06 | Disposition: A | Discharge status: Home / Self Care | Source: Ambulatory Visit | Attending: Obstetrics & Gynecology | Admitting: Obstetrics & Gynecology

## 2024-03-06 VITALS — BP 124/89 | HR 72 | Ht 66.0 in | Wt 240.0 lb

## 2024-03-06 DIAGNOSIS — Z87440 Personal history of urinary (tract) infections: Secondary | ICD-10-CM

## 2024-03-06 DIAGNOSIS — C786 Secondary malignant neoplasm of retroperitoneum and peritoneum: Secondary | ICD-10-CM

## 2024-03-06 DIAGNOSIS — N763 Subacute and chronic vulvitis: Secondary | ICD-10-CM | POA: Diagnosis not present

## 2024-03-06 DIAGNOSIS — N9089 Other specified noninflammatory disorders of vulva and perineum: Secondary | ICD-10-CM | POA: Diagnosis present

## 2024-03-06 DIAGNOSIS — R399 Unspecified symptoms and signs involving the genitourinary system: Secondary | ICD-10-CM | POA: Diagnosis not present

## 2024-03-06 MED ORDER — TERCONAZOLE 0.4 % VA CREA: TOPICAL_CREAM | VAGINAL | 2 refills | Status: AC

## 2024-03-06 NOTE — Progress Notes (Signed)
 GYNECOLOGY  VISIT  CC:   vulvar irritation, h/o recurrent UTIs  HPI: 63 y.o. [id number] Married White or Caucasian female here for complaint of vulvar irritation.  Has chronic diarrhea so does do a lot of wiping to try and stay clean.  Has seen Dr. Arline Laity at [facility].  He knows her prior surgeon and they have discussed diverting ileostomy to help with chronic diarrhea and how difficult it is for her to be out at all.  Has not made final decision about this.  Having chronic dysuria as well.    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 270 tablet 0   cabergoline  (DOSTINEX ) 0.5 MG tablet Take 0.5 tablets (0.25 mg total) by mouth 2 (two) times a week. 12 tablet 3   cholestyramine (QUESTRAN) 4 g packet Take by mouth.     citalopram  (CELEXA ) 40 MG tablet TAKE 1 TABLET(40 MG) BY MOUTH DAILY 90 tablet 0   diazepam  (VALIUM ) 5 MG tablet Take 1 tab 30 minutes prior to enclosed imaging study. 4 tablet 1   dicyclomine  (BENTYL ) 20 MG tablet TAKE 1 TABLET(20 MG) BY MOUTH THREE TIMES DAILY BEFORE MEALS 90 tablet 4    diphenoxylate -atropine  (LOMOTIL ) 2.5-0.025 MG tablet TAKE 2 TABLETS BY MOUTH FOUR TIMES DAILY AS NEEDED FOR DIARRHEA OR LOOSE STOOLS 240 tablet 2   eszopiclone  (LUNESTA ) 2 MG TABS tablet TAKE 1 TABLET(2 MG) BY MOUTH AT BEDTIME AS NEEDED FOR SLEEP. 30 tablet 0   famotidine  (PEPCID ) 20 MG tablet TAKE 1 TABLET(20 MG) BY MOUTH TWICE DAILY 60 tablet 0   fondaparinux  (ARIXTRA ) 10 MG/0.8ML SOLN injection Inject 0.8 mLs (10 mg total) into the skin daily. 72 mL 3   glipiZIDE  (GLUCOTROL ) 5 MG tablet Take 1 tablet (5 mg total) by mouth 2 (two) times daily before a meal. 180 tablet 3   hydrocortisone  (ANUSOL -HC) 2.5 % rectal cream PLACE RECTALLY 4 TIMES DAILY AS NEEDED FOR HEMORRHOIDS OR ANAL ITCHING 30 g 1   HYDROmorphone  (DILAUDID ) 4 MG tablet Take 1 tablet (4 mg total) by mouth every 6 (six) hours as needed for severe pain (pain score 7-10). 60 tablet 0   lidocaine -hydrocortisone  (ANAMANTLE) 3-1 % KIT Place 1 Application rectally 2 (two) times daily. 1 kit 0   loperamide  (IMODIUM ) 2 MG capsule Take 2 capsules (4 mg total) by mouth 4 (four) times daily as needed for diarrhea or loose stools.     meclizine  (ANTIVERT ) 25 MG tablet TAKE 1 TABLET(25 MG) BY MOUTH THREE TIMES DAILY AS NEEDED FOR DIZZINESS  60 tablet 0   mirtazapine  (REMERON ) 15 MG tablet Take 1 tablet (15 mg total) by mouth at bedtime. 30 tablet 3   nystatin -triamcinolone  ointment (MYCOLOG) Apply 1 Application topically 2 (two) times daily. For up to 7 days and then STOP.  Repeat as needed for flares after at least a 2 week break 30 g 0   ondansetron  (ZOFRAN ) 8 MG tablet TAKE 1 TABLET(8 MG) BY MOUTH EVERY 8 HOURS AS NEEDED FOR NAUSEA OR VOMITING 30 tablet 2   orphenadrine  (NORFLEX ) 100 MG tablet TAKE 1 TABLET(100 MG) BY MOUTH AT BEDTIME AS NEEDED FOR MUSCLE SPASMS 30 tablet 2   pantoprazole  (PROTONIX ) 40 MG tablet TAKE 1 TABLET BY MOUTH EVERY DAY BEFORE BREAKFAST 90 tablet 1   potassium chloride  (KLOR-CON  M) 10 MEQ tablet Take 2 tablets (20 mEq  total) by mouth daily. 60 tablet 1   pramoxine-hydrocortisone  (PROCTOCREAM-HC) 1-1 % rectal cream APPLY RECTALLY TO THE AFFECTED AREA TWICE DAILY 30 g 3   promethazine  (PHENERGAN ) 12.5 MG tablet TAKE 1 TABLET(12.5 MG) BY MOUTH EVERY 6 HOURS AS NEEDED FOR NAUSEA OR VOMITING 90 tablet 3   propranolol  (INDERAL ) 20 MG tablet TAKE 1 TABLET(20 MG) BY MOUTH TWICE DAILY 180 tablet 1   vancomycin  (VANCOCIN ) 125 MG capsule 1 p.o. every 6 hours for 10 days followed by 1 p.o. twice daily for 7 days followed by 1 p.o. daily for 7 days followed by 1 p.o. every 2 days for 4 weeks 73 capsule 0   No current facility-administered medications on file prior to visit.    ALLERGIES: Ativan [lorazepam], Corticosteroids, Penicillins, Alprazolam, Erythromycin, Gabapentin , Prednisolone, Prednisone, and Savella  [milnacipran]  SH:  married, non smoker  Review of Systems  Constitutional: Negative.   Genitourinary:        Vulvar irritation    PHYSICAL EXAMINATION:    BP 124/89 (BP Location: Left Arm, Patient Position: Sitting)   Pulse 72   Ht 5\' 6"  (1.676 m)   Wt 240 lb (108.9 kg)   BMI 38.74 kg/m     General appearance: alert, cooperative and appears stated age  Lymph:  no inguinal LAD noted  Pelvic: External genitalia:  erythema within inner labia major and down to rectum              Urethra:  normal appearing urethra with no masses, tenderness or lesions              Bartholins and Skenes: normal                 Vagina: erythematous with watery vaginal discharge noted as well               Chaperone, Myrtie Atkinson, CMA, was present for exam.  Assessment/Plan: 1. History of recurrent UTI (urinary tract infection) (Primary) - will repeat urine culture - Urine Culture  2. Vulvar irritation - Cervicovaginal ancillary only( [place])  3. Chronic vulvitis - will start topical antifungal cream.  She may benefit from using a barrier method like aquaphor in the future - terconazole  (TERAZOL 7 ) 0.4 %  vaginal cream; Apply topically twice daily for up to 7 days  Dispense: 45 g; Refill: 2  4. Malignant pseudomyxoma peritonei (HCC)

## 2024-03-07 ENCOUNTER — Telehealth: Payer: Self-pay | Admitting: *Deleted

## 2024-03-07 LAB — CERVICOVAGINAL ANCILLARY ONLY
Bacterial Vaginitis (gardnerella): NEGATIVE
Candida Glabrata: NEGATIVE
Candida Vaginitis: POSITIVE — AB
Comment: NEGATIVE
Comment: NEGATIVE
Comment: NEGATIVE

## 2024-03-07 NOTE — Telephone Encounter (Signed)
 TC from pharmacy [facility].  Needs verification on Terazol generic prescription instructions.  Pt was seen yesterday and pharmacy needs verification on instructions. [phone].  Pls advice. Terri Fester CMA

## 2024-03-08 NOTE — Telephone Encounter (Signed)
 Dr Annabell Key called pharmacy and provided correct instructions:  applying cream to inner labia majora twice daily.  KD CMA

## 2024-03-10 LAB — URINE CULTURE

## 2024-03-12 ENCOUNTER — Ambulatory Visit (HOSPITAL_BASED_OUTPATIENT_CLINIC_OR_DEPARTMENT_OTHER): Payer: Self-pay | Admitting: Obstetrics & Gynecology

## 2024-03-12 ENCOUNTER — Other Ambulatory Visit: Payer: Self-pay | Admitting: Behavioral Health

## 2024-03-12 DIAGNOSIS — B3731 Acute candidiasis of vulva and vagina: Secondary | ICD-10-CM

## 2024-03-12 DIAGNOSIS — F5105 Insomnia due to other mental disorder: Secondary | ICD-10-CM

## 2024-03-12 MED ORDER — CEPHALEXIN 500 MG PO CAPS: 500.0000 mg | ORAL_CAPSULE | Freq: Two times a day (BID) | ORAL | 0 refills | Status: AC

## 2024-03-12 MED ORDER — FLUCONAZOLE 150 MG PO TABS: 150.0000 mg | ORAL_TABLET | Freq: Once | ORAL | 0 refills | Status: AC

## 2024-03-13 ENCOUNTER — Encounter: Payer: Self-pay | Admitting: Hematology & Oncology

## 2024-03-13 ENCOUNTER — Inpatient Hospital Stay

## 2024-03-13 ENCOUNTER — Inpatient Hospital Stay: Attending: Hematology & Oncology

## 2024-03-13 ENCOUNTER — Inpatient Hospital Stay (HOSPITAL_BASED_OUTPATIENT_CLINIC_OR_DEPARTMENT_OTHER): Admitting: Hematology & Oncology

## 2024-03-13 VITALS — BP 134/91 | HR 66 | Temp 97.7°F | Resp 18 | Ht 66.0 in | Wt 230.8 lb

## 2024-03-13 DIAGNOSIS — C181 Malignant neoplasm of appendix: Secondary | ICD-10-CM | POA: Diagnosis not present

## 2024-03-13 DIAGNOSIS — R300 Dysuria: Secondary | ICD-10-CM | POA: Insufficient documentation

## 2024-03-13 DIAGNOSIS — Z7901 Long term (current) use of anticoagulants: Secondary | ICD-10-CM | POA: Diagnosis not present

## 2024-03-13 DIAGNOSIS — C772 Secondary and unspecified malignant neoplasm of intra-abdominal lymph nodes: Secondary | ICD-10-CM | POA: Diagnosis not present

## 2024-03-13 DIAGNOSIS — Z79899 Other long term (current) drug therapy: Secondary | ICD-10-CM | POA: Diagnosis not present

## 2024-03-13 DIAGNOSIS — D510 Vitamin B12 deficiency anemia due to intrinsic factor deficiency: Secondary | ICD-10-CM | POA: Diagnosis present

## 2024-03-13 DIAGNOSIS — K529 Noninfective gastroenteritis and colitis, unspecified: Secondary | ICD-10-CM | POA: Insufficient documentation

## 2024-03-13 DIAGNOSIS — Z888 Allergy status to other drugs, medicaments and biological substances status: Secondary | ICD-10-CM | POA: Diagnosis not present

## 2024-03-13 DIAGNOSIS — R5383 Other fatigue: Secondary | ICD-10-CM | POA: Insufficient documentation

## 2024-03-13 DIAGNOSIS — R109 Unspecified abdominal pain: Secondary | ICD-10-CM | POA: Insufficient documentation

## 2024-03-13 DIAGNOSIS — Z880 Allergy status to penicillin: Secondary | ICD-10-CM | POA: Diagnosis not present

## 2024-03-13 DIAGNOSIS — Z86711 Personal history of pulmonary embolism: Secondary | ICD-10-CM | POA: Diagnosis not present

## 2024-03-13 DIAGNOSIS — D329 Benign neoplasm of meninges, unspecified: Secondary | ICD-10-CM | POA: Insufficient documentation

## 2024-03-13 DIAGNOSIS — Z881 Allergy status to other antibiotic agents status: Secondary | ICD-10-CM | POA: Diagnosis not present

## 2024-03-13 DIAGNOSIS — M549 Dorsalgia, unspecified: Secondary | ICD-10-CM | POA: Diagnosis not present

## 2024-03-13 DIAGNOSIS — E061 Subacute thyroiditis: Secondary | ICD-10-CM

## 2024-03-13 DIAGNOSIS — Z95828 Presence of other vascular implants and grafts: Secondary | ICD-10-CM

## 2024-03-13 LAB — CBC WITH DIFFERENTIAL (CANCER CENTER ONLY)
Abs Immature Granulocytes: 0.03 10*3/uL (ref 0.00–0.07)
Basophils Absolute: 0.1 10*3/uL (ref 0.0–0.1)
Basophils Relative: 1 %
Eosinophils Absolute: 0.3 10*3/uL (ref 0.0–0.5)
Eosinophils Relative: 3 %
HCT: 40.5 % (ref 36.0–46.0)
Hemoglobin: 13 g/dL (ref 12.0–15.0)
Immature Granulocytes: 0 %
Lymphocytes Relative: 45 %
Lymphs Abs: 3.7 10*3/uL (ref 0.7–4.0)
MCH: 30.8 pg (ref 26.0–34.0)
MCHC: 32.1 g/dL (ref 30.0–36.0)
MCV: 96 fL (ref 80.0–100.0)
Monocytes Absolute: 0.6 10*3/uL (ref 0.1–1.0)
Monocytes Relative: 8 %
Neutro Abs: 3.5 10*3/uL (ref 1.7–7.7)
Neutrophils Relative %: 43 %
Platelet Count: 139 10*3/uL — ABNORMAL LOW (ref 150–400)
RBC: 4.22 MIL/uL (ref 3.87–5.11)
RDW: 14.3 % (ref 11.5–15.5)
WBC Count: 8.2 10*3/uL (ref 4.0–10.5)
nRBC: 0 % (ref 0.0–0.2)

## 2024-03-13 LAB — IRON AND IRON BINDING CAPACITY (CC-WL,HP ONLY)
Iron: 101 ug/dL (ref 28–170)
Saturation Ratios: 25 % (ref 10.4–31.8)
TIBC: 398 ug/dL (ref 250–450)
UIBC: 297 ug/dL (ref 148–442)

## 2024-03-13 LAB — CMP (CANCER CENTER ONLY)
ALT: 28 U/L (ref 0–44)
AST: 36 U/L (ref 15–41)
Albumin: 4.2 g/dL (ref 3.5–5.0)
Alkaline Phosphatase: 227 U/L — ABNORMAL HIGH (ref 38–126)
Anion gap: 9 (ref 5–15)
BUN: 18 mg/dL (ref 8–23)
CO2: 25 mmol/L (ref 22–32)
Calcium: 9.4 mg/dL (ref 8.9–10.3)
Chloride: 106 mmol/L (ref 98–111)
Creatinine: 1 mg/dL (ref 0.44–1.00)
GFR, Estimated: >60 mL/min (ref >60)
Glucose, Bld: 260 mg/dL — ABNORMAL HIGH (ref 70–99)
Potassium: 3.8 mmol/L (ref 3.5–5.1)
Sodium: 140 mmol/L (ref 135–145)
Total Bilirubin: 0.6 mg/dL (ref 0.0–1.2)
Total Protein: 7.4 g/dL (ref 6.5–8.1)

## 2024-03-13 LAB — FERRITIN: Ferritin: 102 ng/mL (ref 11–307)

## 2024-03-13 LAB — TSH: TSH: 2.12 u[IU]/mL (ref 0.350–4.500)

## 2024-03-13 LAB — VITAMIN B12: Vitamin B-12: 285 pg/mL (ref 180–914)

## 2024-03-13 MED ORDER — SODIUM CHLORIDE 0.9% FLUSH
10.0000 mL | Freq: Once | INTRAVENOUS | Status: AC
  Administered 2024-03-13: 10 mL via INTRAVENOUS

## 2024-03-13 MED ORDER — LANREOTIDE ACETATE 120 MG/0.5ML ~~LOC~~ SOLN
120.0000 mg | Freq: Once | SUBCUTANEOUS | Status: AC
  Administered 2024-03-13: 120 mg via SUBCUTANEOUS
  Filled 2024-03-13: qty 120

## 2024-03-13 MED ORDER — HEPARIN SOD (PORK) LOCK FLUSH 100 UNIT/ML IV SOLN
500.0000 [IU] | Freq: Once | INTRAVENOUS | Status: AC
  Administered 2024-03-13: 500 [IU] via INTRAVENOUS

## 2024-03-13 NOTE — Patient Instructions (Signed)

## 2024-03-13 NOTE — Patient Instructions (Signed)
 Lanreotide Injection What is this medication? LANREOTIDE (lan REE oh tide) treats high levels of growth hormone (acromegaly). It is used when other therapies have not worked well enough or cannot be tolerated. It works by reducing the amount of growth hormone your body makes. This reduces symptoms and the risk of health problems caused by too much growth hormone, such as diabetes and heart disease. It may also be used to treat neuroendocrine tumors, a cancer of the cells that release hormones and other substances in your body. It works by slowing down the release of these substances from the cells. This slows tumor growth. It also decreases the symptoms of carcinoid syndrome, such as flushing or diarrhea. This medicine may be used for other purposes; ask your health care provider or pharmacist if you have questions. COMMON BRAND NAME(S): Somatuline [place] What should I tell my care team before I take this medication? They need to know if you have any of these conditions: Diabetes Gallbladder disease Heart disease Kidney disease Liver disease Pancreatic disease Thyroid disease An unusual or allergic reaction to lanreotide, other medications, foods, dyes, or preservatives Pregnant or trying to get pregnant Breastfeeding How should I use this medication? This medication is injected under the skin. It is given by your care team in a hospital or clinic setting. Talk to your care team about the use of this medication in children. Special care may be needed. Overdosage: If you think you have taken too much of this medicine contact a poison control center or emergency room at once. NOTE: This medicine is only for you. Do not share this medicine with others. What if I miss a dose? Keep appointments for follow-up doses. It is important not to miss your dose. Call your care team if you are unable to keep an appointment. What may interact with this medication? Bromocriptine Cyclosporine Certain  medications for blood pressure, heart disease, irregular heartbeat Certain medications for diabetes Quinidine Terfenadine This list may not describe all possible interactions. Give your health care provider a list of all the medicines, herbs, non-prescription drugs, or dietary supplements you use. Also tell them if you smoke, drink alcohol, or use illegal drugs. Some items may interact with your medicine. What should I watch for while using this medication? Visit your care team for regular checks on your progress. Tell your care team if your symptoms do not start to get better or if they get worse. Your condition will be monitored carefully while you are receiving this medication. You may need blood work while you are taking this medication. This medication may increase blood sugar. The risk may be higher in patients who already have diabetes. Ask your care team what you can do to lower your risk of diabetes while taking this medication. Talk to your care team if you wish to become pregnant or think you may be pregnant. This medication can cause serious birth defects. Do not breast-feed while taking this medication and for 6 months after stopping therapy. This medication may cause infertility. Talk to your care team if you are concerned about your fertility. What side effects may I notice from receiving this medication? Side effects that you should report to your care team as soon as possible: Allergic reactions--skin rash, itching, hives, swelling of the face, lips, tongue, or throat Gallbladder problems--severe stomach pain, nausea, vomiting, fever High blood sugar (hyperglycemia)--increased thirst or amount of urine, unusual weakness or fatigue, blurry vision Increase in blood pressure Low blood sugar (hypoglycemia)--pale, blue or purple  skin or lips, sweating, fussiness, rapid heartbeat, poor feeding, low body temperature Low thyroid levels (hypothyroidism)--unusual weakness or fatigue,  increased sensitivity to cold, constipation, hair loss, dry skin, weight gain, feelings of depression Oily or light-colored stools, diarrhea, bloating, weight loss Slow heartbeat--dizziness, feeling faint or lightheaded, confusion, trouble breathing, unusual weakness or fatigue Side effects that usually do not require medical attention (report these to your care team if they continue or are bothersome): Diarrhea Dizziness Headache Muscle spasms Nausea Pain, redness, or irritation at injection site Stomach pain This list may not describe all possible side effects. Call your doctor for medical advice about side effects. You may report side effects to FDA at 1-800-FDA-1088. Where should I keep my medication? This medication is given in a hospital or clinic. It will not be stored at home. NOTE: This sheet is a summary. It may not cover all possible information. If you have questions about this medicine, talk to your doctor, pharmacist, or health care provider.  2024 Elsevier/Gold Standard (2023-09-22 00:00:00)

## 2024-03-13 NOTE — Progress Notes (Signed)
 Hematology and Oncology Follow Up Visit  Wendy Barber [id number] [birth year] 63 y.o. 03/13/2024   Principle Diagnosis:  History of metastatic appendiceal cancer  -- recurrent  Superior mesenteric vein thrombus Thromboembolism of the RIGHT leg Pernicious anemia Acute pulmonary embolism-segmental right pulmonary artery Carcinoid/neuroendocrine carcinoma  Current Therapy:   HIPEC - Surgery done in [place] in 11/2020 Arixtra  10 mg subcu daily --start on 2/16 2024 Vitamin B12 5000 mcg PO daily  Somatuline 120 mg IM monthly-start on 07/12/2023     Interim History:  Wendy Barber is in for follow-up.  She is still have some problems with diarrhea.  This really is becoming an issue for her.  Some of this might be from her diabetes.  She may have dumping syndrome.  She is going to see her Endocrinologist.  She will ask her if that might be a problem.  She has done well with the Somatuline.  Her last Chromogranin A was down to 37.5.  She also has a urinary tract infection.  She has a yeast infection in the genital area.  She thinks that the Somatuline has helped.  She continues on the Arixtra .  She does get short of breath on occasion.  She did have a thyroid  nodule by was biopsy.  This was biopsied on 02/27/2024.  The pathology report ([id number]) showed benign follicular nodule.  She also has a meningioma I think in the base of the brain.  She is going to see neurosurgery regarding this.  I personally would not see a problem with her having this resected.  This seems to be in a very sensitive area.  I do not think she is symptomatic with the right now.  I would think right now would be the time to have it resected.  The Neuroendocrine tumor should not be a factor.  She has had no bleeding.  Overall, I would have said that her performance status is probably ECOG 1.   Wt Readings from Last 3 Encounters:  03/13/24 230 lb 12.8 oz (104.7 kg)  03/06/24 240 lb (108.9 kg)  02/28/24 240  lb (108.9 kg)    Medications:  Current Outpatient Medications:    amLODipine  (NORVASC ) 5 MG tablet, TAKE 1 TABLET(5 MG) BY MOUTH DAILY, Disp: 90 tablet, Rfl: 1   busPIRone  (BUSPAR ) 15 MG tablet, Take 1 tablet (15 mg total) by mouth 3 (three) times daily., Disp: 270 tablet, Rfl: 0   cabergoline  (DOSTINEX ) 0.5 MG tablet, Take 0.5 tablets (0.25 mg total) by mouth 2 (two) times a week., Disp: 12 tablet, Rfl: 3   cephALEXin  (KEFLEX ) 500 MG capsule, Take 1 capsule (500 mg total) by mouth 2 (two) times daily for 7 days., Disp: 14 capsule, Rfl: 0   citalopram  (CELEXA ) 40 MG tablet, TAKE 1 TABLET(40 MG) BY MOUTH DAILY, Disp: 90 tablet, Rfl: 0   diazepam  (VALIUM ) 5 MG tablet, Take 1 tab 30 minutes prior to enclosed imaging study., Disp: 4 tablet, Rfl: 1   dicyclomine  (BENTYL ) 20 MG tablet, TAKE 1 TABLET(20 MG) BY MOUTH THREE TIMES DAILY BEFORE MEALS, Disp: 90 tablet, Rfl: 4   diphenoxylate -atropine  (LOMOTIL ) 2.5-0.025 MG tablet, TAKE 2 TABLETS BY MOUTH FOUR TIMES DAILY AS NEEDED FOR DIARRHEA OR LOOSE STOOLS, Disp: 240 tablet, Rfl: 2   eszopiclone  (LUNESTA ) 2 MG TABS tablet, TAKE 1 TABLET(2 MG) BY MOUTH AT BEDTIME AS NEEDED FOR SLEEP, Disp: 30 tablet, Rfl: 1   famotidine  (PEPCID ) 20 MG tablet, TAKE 1 TABLET(20 MG) BY MOUTH TWICE DAILY, Disp: 60 tablet, Rfl:  0   fluconazole  (DIFLUCAN ) 150 MG tablet, Take 150 mg by mouth once., Disp: , Rfl:    fondaparinux  (ARIXTRA ) 10 MG/0.8ML SOLN injection, Inject 0.8 mLs (10 mg total) into the skin daily., Disp: 72 mL, Rfl: 3   glipiZIDE  (GLUCOTROL ) 5 MG tablet, Take 1 tablet (5 mg total) by mouth 2 (two) times daily before a meal., Disp: 180 tablet, Rfl: 3   hydrocortisone  (ANUSOL -HC) 2.5 % rectal cream, PLACE RECTALLY 4 TIMES DAILY AS NEEDED FOR HEMORRHOIDS OR ANAL ITCHING, Disp: 30 g, Rfl: 1   HYDROmorphone  (DILAUDID ) 4 MG tablet, Take 1 tablet (4 mg total) by mouth every 6 (six) hours as needed for severe pain (pain score 7-10)., Disp: 60 tablet, Rfl: 0    lidocaine -hydrocortisone  (ANAMANTLE) 3-1 % KIT, Place 1 Application rectally 2 (two) times daily., Disp: 1 kit, Rfl: 0   loperamide  (IMODIUM ) 2 MG capsule, Take 2 capsules (4 mg total) by mouth 4 (four) times daily as needed for diarrhea or loose stools., Disp: , Rfl:    meclizine  (ANTIVERT ) 25 MG tablet, TAKE 1 TABLET(25 MG) BY MOUTH THREE TIMES DAILY AS NEEDED FOR DIZZINESS, Disp: 60 tablet, Rfl: 0   mirtazapine  (REMERON ) 15 MG tablet, Take 1 tablet (15 mg total) by mouth at bedtime., Disp: 30 tablet, Rfl: 3   ondansetron  (ZOFRAN ) 8 MG tablet, TAKE 1 TABLET(8 MG) BY MOUTH EVERY 8 HOURS AS NEEDED FOR NAUSEA OR VOMITING, Disp: 30 tablet, Rfl: 2   orphenadrine  (NORFLEX ) 100 MG tablet, TAKE 1 TABLET(100 MG) BY MOUTH AT BEDTIME AS NEEDED FOR MUSCLE SPASMS, Disp: 30 tablet, Rfl: 2   pantoprazole  (PROTONIX ) 40 MG tablet, TAKE 1 TABLET BY MOUTH EVERY DAY BEFORE BREAKFAST, Disp: 90 tablet, Rfl: 1   pramoxine-hydrocortisone  (PROCTOCREAM-HC) 1-1 % rectal cream, APPLY RECTALLY TO THE AFFECTED AREA TWICE DAILY, Disp: 30 g, Rfl: 3   promethazine  (PHENERGAN ) 12.5 MG tablet, TAKE 1 TABLET(12.5 MG) BY MOUTH EVERY 6 HOURS AS NEEDED FOR NAUSEA OR VOMITING, Disp: 90 tablet, Rfl: 3   propranolol  (INDERAL ) 20 MG tablet, TAKE 1 TABLET(20 MG) BY MOUTH TWICE DAILY, Disp: 180 tablet, Rfl: 1   terconazole  (TERAZOL 7 ) 0.4 % vaginal cream, Apply topically twice daily for up to 7 days, Disp: 45 g, Rfl: 2   nystatin -triamcinolone  ointment (MYCOLOG), Apply 1 Application topically 2 (two) times daily. For up to 7 days and then STOP.  Repeat as needed for flares after at least a 2 week break (Patient not taking: Reported on 03/13/2024), Disp: 30 g, Rfl: 0   vancomycin  (VANCOCIN ) 125 MG capsule, 1 p.o. every 6 hours for 10 days followed by 1 p.o. twice daily for 7 days followed by 1 p.o. daily for 7 days followed by 1 p.o. every 2 days for 4 weeks (Patient not taking: Reported on 03/13/2024), Disp: 73 capsule, Rfl: 0  Allergies:   Allergies  Allergen Reactions   Ativan [Lorazepam] Swelling and Other (See Comments)    Face & Throat Swelling  Note: tolerates midazolam  fine   Corticosteroids Other (See Comments)    "Psychotic behavior"    Penicillins Shortness Of Breath and Other (See Comments)    Irregular and rapid Heart Rate, too    Alprazolam Hives and Other (See Comments)    Hard to arouse, unresponsiveness also   Erythromycin Nausea And Vomiting        Gabapentin  Other (See Comments)    Made the patient feel depressed   Prednisolone Anxiety   Prednisone Anxiety and Other (See Comments)    "  Anxiety & Nervous Breakdown"   Savella  [Milnacipran] Other (See Comments)    Reaction not noted    Past Medical History, Surgical history, Social history, and Family History were reviewed and updated.  Review of Systems: Review of Systems  Constitutional:  Positive for fatigue and unexpected weight change.  HENT:  Negative.    Eyes: Negative.   Respiratory: Negative.    Cardiovascular: Negative.   Gastrointestinal:  Positive for abdominal pain and diarrhea.  Endocrine: Negative.   Genitourinary:  Positive for dysuria.   Musculoskeletal:  Positive for back pain.  Skin: Negative.   Neurological: Negative.   Hematological: Negative.   Psychiatric/Behavioral:  Negative for depression.     Physical Exam: Vitals:   03/13/24 0844  BP: (!) 134/91  Pulse: 66  Resp: 18  Temp: 97.7 F (36.5 C)  SpO2: 97%   Wt Readings from Last 3 Encounters:  03/13/24 230 lb 12.8 oz (104.7 kg)  03/06/24 240 lb (108.9 kg)  02/28/24 240 lb (108.9 kg)    Physical Exam Vitals reviewed.  HENT:     Head: Normocephalic and atraumatic.  Eyes:     Pupils: Pupils are equal, round, and reactive to light.  Cardiovascular:     Rate and Rhythm: Normal rate and regular rhythm.     Heart sounds: Normal heart sounds.  Pulmonary:     Effort: Pulmonary effort is normal.     Breath sounds: Normal breath sounds.  Abdominal:      General: Bowel sounds are normal.     Palpations: Abdomen is soft.  Musculoskeletal:        General: No tenderness or deformity. Normal range of motion.     Cervical back: Normal range of motion.  Lymphadenopathy:     Cervical: No cervical adenopathy.  Skin:    General: Skin is warm and dry.     Findings: No erythema or rash.  Neurological:     Mental Status: She is alert and oriented to person, place, and time.  Psychiatric:        Behavior: Behavior normal.        Thought Content: Thought content normal.        Judgment: Judgment normal.   Lab Results  Component Value Date   WBC 8.2 03/13/2024   HGB 13.0 03/13/2024   HCT 40.5 03/13/2024   MCV 96.0 03/13/2024   PLT 139 (L) 03/13/2024     Chemistry      Component Value Date/Time   NA 140 03/13/2024 0826   NA 137 04/24/2023 1437   K 3.8 03/13/2024 0826   CL 106 03/13/2024 0826   CO2 25 03/13/2024 0826   BUN 18 03/13/2024 0826   BUN 18 04/24/2023 1437   CREATININE 1.00 03/13/2024 0826   CREATININE 0.96 01/19/2024 1415      Component Value Date/Time   CALCIUM  9.4 03/13/2024 0826   ALKPHOS 227 (H) 03/13/2024 0826   AST 36 03/13/2024 0826   ALT 28 03/13/2024 0826   BILITOT 0.6 03/13/2024 0826      Impression and Plan: Ms. Gilcrest is a very charming 63 year-old white female. She has recurrent appendiceal cancer.  She underwent a HIPEC procedure in [place] a couple years ago. Unfortunately she had had chronic diarrhea since including C. Diff which has been successfully treatment.    I know that a quite a few doctors are working on her with respect to the gastrointestinal issues.  At least, her neuroendocrine issue should be stable.  She probably does need to have scans done.  I will set these up for 3 weeks.  She will get her Somatuline today.  She does not think that she is any IV fluids.  Will plan to get her back in 1 month.    Ivor Mars, MD 5/21/202510:02 AM

## 2024-03-15 LAB — CHROMOGRANIN A: Chromogranin A (ng/mL): 45.8 ng/mL (ref 0.0–101.8)

## 2024-03-18 ENCOUNTER — Other Ambulatory Visit: Payer: Self-pay | Admitting: Behavioral Health

## 2024-03-18 DIAGNOSIS — F331 Major depressive disorder, recurrent, moderate: Secondary | ICD-10-CM

## 2024-03-18 DIAGNOSIS — F411 Generalized anxiety disorder: Secondary | ICD-10-CM

## 2024-03-20 ENCOUNTER — Emergency Department (HOSPITAL_COMMUNITY)

## 2024-03-20 ENCOUNTER — Other Ambulatory Visit: Payer: Self-pay | Admitting: Hematology & Oncology

## 2024-03-20 ENCOUNTER — Ambulatory Visit: Payer: Self-pay

## 2024-03-20 ENCOUNTER — Other Ambulatory Visit: Payer: Self-pay

## 2024-03-20 ENCOUNTER — Encounter (HOSPITAL_COMMUNITY): Payer: Self-pay | Admitting: *Deleted

## 2024-03-20 ENCOUNTER — Emergency Department (HOSPITAL_COMMUNITY)
Admission: EM | Admit: 2024-03-20 | Discharge: 2024-03-21 | Disposition: A | Discharge status: Home / Self Care | Attending: Emergency Medicine | Admitting: Emergency Medicine

## 2024-03-20 DIAGNOSIS — H8112 Benign paroxysmal vertigo, left ear: Secondary | ICD-10-CM

## 2024-03-20 DIAGNOSIS — Z8589 Personal history of malignant neoplasm of other organs and systems: Secondary | ICD-10-CM | POA: Diagnosis not present

## 2024-03-20 DIAGNOSIS — R319 Hematuria, unspecified: Secondary | ICD-10-CM | POA: Diagnosis present

## 2024-03-20 DIAGNOSIS — N132 Hydronephrosis with renal and ureteral calculous obstruction: Secondary | ICD-10-CM | POA: Diagnosis not present

## 2024-03-20 DIAGNOSIS — N200 Calculus of kidney: Secondary | ICD-10-CM

## 2024-03-20 LAB — COMPREHENSIVE METABOLIC PANEL WITH GFR
ALT: 25 U/L (ref 0–44)
AST: 30 U/L (ref 15–41)
Albumin: 3.5 g/dL (ref 3.5–5.0)
Alkaline Phosphatase: 155 U/L — ABNORMAL HIGH (ref 38–126)
Anion gap: 11 (ref 5–15)
BUN: 16 mg/dL (ref 8–23)
CO2: 26 mmol/L (ref 22–32)
Calcium: 8.9 mg/dL (ref 8.9–10.3)
Chloride: 104 mmol/L (ref 98–111)
Creatinine, Ser: 0.98 mg/dL (ref 0.44–1.00)
GFR, Estimated: >60 mL/min (ref >60)
Glucose, Bld: 272 mg/dL — ABNORMAL HIGH (ref 70–99)
Potassium: 4.1 mmol/L (ref 3.5–5.1)
Sodium: 141 mmol/L (ref 135–145)
Total Bilirubin: 0.6 mg/dL (ref 0.0–1.2)
Total Protein: 7.2 g/dL (ref 6.5–8.1)

## 2024-03-20 LAB — URINALYSIS, ROUTINE W REFLEX MICROSCOPIC
Bacteria, UA: NONE SEEN
Bilirubin Urine: NEGATIVE
Glucose, UA: >=500 mg/dL — AB
Ketones, ur: NEGATIVE mg/dL
Leukocytes,Ua: NEGATIVE
Nitrite: NEGATIVE
Protein, ur: 30 mg/dL — AB
RBC / HPF: >50 RBC/hpf (ref 0–5)
Specific Gravity, Urine: 1.021 (ref 1.005–1.030)
pH: 5 (ref 5.0–8.0)

## 2024-03-20 LAB — CBC WITH DIFFERENTIAL/PLATELET
Abs Immature Granulocytes: 0.04 10*3/uL (ref 0.00–0.07)
Basophils Absolute: 0.1 10*3/uL (ref 0.0–0.1)
Basophils Relative: 1 %
Eosinophils Absolute: 0.3 10*3/uL (ref 0.0–0.5)
Eosinophils Relative: 2 %
HCT: 39.5 % (ref 36.0–46.0)
Hemoglobin: 12.7 g/dL (ref 12.0–15.0)
Immature Granulocytes: 0 %
Lymphocytes Relative: 33 %
Lymphs Abs: 3.8 10*3/uL (ref 0.7–4.0)
MCH: 30.9 pg (ref 26.0–34.0)
MCHC: 32.2 g/dL (ref 30.0–36.0)
MCV: 96.1 fL (ref 80.0–100.0)
Monocytes Absolute: 0.7 10*3/uL (ref 0.1–1.0)
Monocytes Relative: 6 %
Neutro Abs: 6.6 10*3/uL (ref 1.7–7.7)
Neutrophils Relative %: 58 %
Platelets: 194 10*3/uL (ref 150–400)
RBC: 4.11 MIL/uL (ref 3.87–5.11)
RDW: 14.3 % (ref 11.5–15.5)
WBC: 11.5 10*3/uL — ABNORMAL HIGH (ref 4.0–10.5)
nRBC: 0 % (ref 0.0–0.2)

## 2024-03-20 NOTE — Telephone Encounter (Signed)
 Noted. With those symptoms that may be best. Thanks.

## 2024-03-20 NOTE — Telephone Encounter (Signed)
 Patient going to urgent care for potential UTI

## 2024-03-20 NOTE — ED Provider Triage Note (Signed)
 Emergency Medicine Provider Triage Evaluation Note  Wendy Barber , a 63 y.o. female  was evaluated in triage.  Pt complains of left flank pain and hematuria X 1 week, hx kidney stones and frequent UTIs, just finished keflex  2 days ago.  Review of Systems  Positive:  Negative:   Physical Exam  BP (!) 165/102   Pulse 66   Temp 98.5 F (36.9 C)   Resp 18   Ht 5\' 6"  (1.676 m)   Wt 104.7 kg   SpO2 98%   BMI 37.26 kg/m  Gen:   Awake, no distress   Resp:  Normal effort  MSK:   Moves extremities without difficulty  Other:    Medical Decision Making  Medically screening exam initiated at 6:49 PM.  Appropriate orders placed.  Wendy Barber was informed that the remainder of the evaluation will be completed by another provider, this initial triage assessment does not replace that evaluation, and the importance of remaining in the ED until their evaluation is complete.      Aimee Houseman, [place] 03/20/24 1850

## 2024-03-20 NOTE — ED Triage Notes (Signed)
 Lt flank pain and bloody  urine for approx one week  hx of a kidney stone

## 2024-03-20 NOTE — ED Notes (Signed)
 VSS pt sitting in chair with no further complaints. NAD NARD

## 2024-03-20 NOTE — Telephone Encounter (Signed)
 Chief Complaint: uti on abx  Symptoms: new adominal and flank pain, hematuria Frequency: constant Pertinent Negatives: Patient denies fever, had chills Disposition: [] ED /[x] Urgent Care (no appt availability in office) / [] Appointment(In office/virtual)/ []  [facility]/ [] Home Care/ [] Refused Recommended Disposition /[] [facility]/ []  Follow-up with PCP Additional Notes:  Frequent UTI's. Finishing course of antibiotic today for uti. Friday noted dark blood in urine intermittently, constant since Sunday night. New abominal pain that starts in lower abdomen and wraps around entire lower back. Vomited strongly on Friday, wondering if forceful vomiting caused blood in urine. No acute appointments available will proceed to urgent care.   Copied from CRM [id number]. Topic: Clinical - Red Word Triage >> Mar 20, 2024  1:44 PM Dewanda Foots wrote: Red Word that prompted transfer to Nurse Triage: Pt states that she has had 4 UTI's in the 3-4 months.  She is put on antibiotics for it and today is her last dose for the most recent one.  She is now experiencing pain on both sides of her abdomen on the left more so than the right and believes there is blood in the urine. Still having pain, pressure, and frequency.   Noticed blood in the urine on Sunday 5/25. Reason for Disposition  [1] Pain or burning with passing urine AND [2] side (flank) or back pain present  Protocols used: Urine - Blood In-A-AH

## 2024-03-21 ENCOUNTER — Ambulatory Visit: Admitting: Family Medicine

## 2024-03-21 MED ORDER — TAMSULOSIN HCL 0.4 MG PO CAPS
0.4000 mg | ORAL_CAPSULE | Freq: Every day | ORAL | 0 refills | Status: AC
Start: 2024-03-21 — End: 2024-04-20

## 2024-03-21 NOTE — ED Provider Notes (Signed)
 [place] EMERGENCY DEPARTMENT AT [facility] Provider Note   CSN: [id number] Arrival date & time: 03/20/24  1727     History  Chief Complaint  Patient presents with   Hematuria    Wendy Barber is a 63 y.o. female.  Patient presents to the emergency department complaining of left-sided flank pain with hematuria for the past week.  Past medical history significant for malignant pseudomyxoma peritonei, DVT, bilateral pulmonary emboli.  Patient states that approxi-1 week ago she had 1 day where she had multiple episodes of emesis.  The emesis subsided over the weekend.  Today she has left-sided flank pain and noticed some blood in her urine.  She is currently denying nausea, vomiting, other abdominal pain, dysuria, urinary frequency, chest pain, shortness of breath, fevers.  She does endorse frequent UTIs most recently completed a course of Keflex .   Hematuria       Home Medications Prior to Admission medications   Medication Sig Start Date End Date Taking? Authorizing Provider  tamsulosin (FLOMAX) 0.4 MG CAPS capsule Take 1 capsule (0.4 mg total) by mouth daily. 03/21/24 04/20/24 Yes Abelardo Hoehn B, PA-C  amLODipine  (NORVASC ) 5 MG tablet TAKE 1 TABLET(5 MG) BY MOUTH DAILY 11/22/23   Benjiman Bras, MD  busPIRone  (BUSPAR ) 15 MG tablet TAKE 1 TABLET(15 MG) BY MOUTH THREE TIMES DAILY 03/19/24   Lincoln Renshaw, NP  cabergoline  (DOSTINEX ) 0.5 MG tablet Take 0.5 tablets (0.25 mg total) by mouth 2 (two) times a week. 01/25/24   [name], Ibtehal Jaralla, MD  citalopram  (CELEXA ) 40 MG tablet TAKE 1 TABLET(40 MG) BY MOUTH DAILY 02/18/24   Marita Sidle A, NP  diazepam  (VALIUM ) 5 MG tablet Take 1 tab 30 minutes prior to enclosed imaging study. 01/22/24   Benjiman Bras, MD  dicyclomine  (BENTYL ) 20 MG tablet TAKE 1 TABLET(20 MG) BY MOUTH THREE TIMES DAILY BEFORE MEALS 01/16/24   Ivor Mars, MD  diphenoxylate -atropine  (LOMOTIL ) 2.5-0.025 MG tablet TAKE 2 TABLETS BY MOUTH FOUR  TIMES DAILY AS NEEDED FOR DIARRHEA OR LOOSE STOOLS 02/21/24   Ivor Mars, MD  eszopiclone  (LUNESTA ) 2 MG TABS tablet TAKE 1 TABLET(2 MG) BY MOUTH AT BEDTIME AS NEEDED FOR SLEEP 03/13/24   Lincoln Renshaw, NP  famotidine  (PEPCID ) 20 MG tablet TAKE 1 TABLET(20 MG) BY MOUTH TWICE DAILY 01/12/24   Benjiman Bras, MD  fluconazole  (DIFLUCAN ) 150 MG tablet Take 150 mg by mouth once. 03/12/24   [provider]  fondaparinux  (ARIXTRA ) 10 MG/0.8ML SOLN injection Inject 0.8 mLs (10 mg total) into the skin daily. 03/15/23   Ivor Mars, MD  glipiZIDE  (GLUCOTROL ) 5 MG tablet Take 1 tablet (5 mg total) by mouth 2 (two) times daily before a meal. 01/19/24   [name], Julian Obey, MD  hydrocortisone  (ANUSOL -HC) 2.5 % rectal cream PLACE RECTALLY 4 TIMES DAILY AS NEEDED FOR HEMORRHOIDS OR ANAL ITCHING 10/02/23   Ivor Mars, MD  HYDROmorphone  (DILAUDID ) 4 MG tablet Take 1 tablet (4 mg total) by mouth every 6 (six) hours as needed for severe pain (pain score 7-10). 02/08/24   Ivor Mars, MD  lidocaine -hydrocortisone  (ANAMANTLE) 3-1 % KIT Place 1 Application rectally 2 (two) times daily. 12/29/23   Teddi Favors, DO  loperamide  (IMODIUM ) 2 MG capsule Take 2 capsules (4 mg total) by mouth 4 (four) times daily as needed for diarrhea or loose stools. 05/08/22   Audria Leather, MD  meclizine  (ANTIVERT ) 25 MG tablet TAKE 1 TABLET(25 MG) BY  MOUTH THREE TIMES DAILY AS NEEDED FOR DIZZINESS 03/20/24   Ivor Mars, MD  mirtazapine  (REMERON ) 15 MG tablet Take 1 tablet (15 mg total) by mouth at bedtime. 02/13/24   Lincoln Renshaw, NP  nystatin -triamcinolone  ointment (MYCOLOG) Apply 1 Application topically 2 (two) times daily. For up to 7 days and then STOP.  Repeat as needed for flares after at least a 2 week break Patient not taking: Reported on 03/13/2024 01/30/23   Dellar Fenton, DO  ondansetron  (ZOFRAN ) 8 MG tablet TAKE 1 TABLET(8 MG) BY MOUTH EVERY 8 HOURS AS NEEDED FOR NAUSEA OR VOMITING 02/29/24    Ivor Mars, MD  orphenadrine  (NORFLEX ) 100 MG tablet TAKE 1 TABLET(100 MG) BY MOUTH AT BEDTIME AS NEEDED FOR MUSCLE SPASMS 01/29/24   Ivor Mars, MD  pantoprazole  (PROTONIX ) 40 MG tablet TAKE 1 TABLET BY MOUTH EVERY DAY BEFORE BREAKFAST 12/04/23   Benjiman Bras, MD  pramoxine-hydrocortisone  (PROCTOCREAM-HC) 1-1 % rectal cream APPLY RECTALLY TO THE AFFECTED AREA TWICE DAILY 10/24/23   Ivor Mars, MD  promethazine  (PHENERGAN ) 12.5 MG tablet TAKE 1 TABLET(12.5 MG) BY MOUTH EVERY 6 HOURS AS NEEDED FOR NAUSEA OR VOMITING 01/13/24   Ivor Mars, MD  propranolol  (INDERAL ) 20 MG tablet TAKE 1 TABLET(20 MG) BY MOUTH TWICE DAILY 10/06/23   Benjiman Bras, MD  terconazole  (TERAZOL 7 ) 0.4 % vaginal cream Apply topically twice daily for up to 7 days 03/06/24   [name] Rein, MD  vancomycin  (VANCOCIN ) 125 MG capsule 1 p.o. every 6 hours for 10 days followed by 1 p.o. twice daily for 7 days followed by 1 p.o. daily for 7 days followed by 1 p.o. every 2 days for 4 weeks Patient not taking: Reported on 03/13/2024 01/26/24   Benjiman Bras, MD      Allergies    Ativan [lorazepam], Corticosteroids, Penicillins, Alprazolam, Erythromycin, Gabapentin , Prednisolone, Prednisone, and Savella  [milnacipran]    Review of Systems   Review of Systems  Genitourinary:  Positive for hematuria.    Physical Exam Updated Vital Signs BP (!) 159/91   Pulse 61   Temp 98 F (36.7 C)   Resp 17   Ht 5\' 6"  (1.676 m)   Wt 104.7 kg   SpO2 97%   BMI 37.26 kg/m  Physical Exam Vitals and nursing note reviewed.  Constitutional:      General: She is not in acute distress.    Appearance: She is well-developed.  HENT:     Head: Normocephalic and atraumatic.  Eyes:     Conjunctiva/sclera: Conjunctivae normal.  Cardiovascular:     Rate and Rhythm: Normal rate and regular rhythm.  Pulmonary:     Effort: Pulmonary effort is normal. No respiratory distress.  Abdominal:     Palpations: Abdomen is soft.      Tenderness: There is no abdominal tenderness. There is left CVA tenderness.  Musculoskeletal:        General: No swelling.     Cervical back: Neck supple.  Skin:    General: Skin is warm and dry.  Neurological:     Mental Status: She is alert.  Psychiatric:        Mood and Affect: Mood normal.     ED Results / Procedures / Treatments   Labs (all labs ordered are listed, but only abnormal results are displayed) Labs Reviewed  COMPREHENSIVE METABOLIC PANEL WITH GFR - Abnormal; Notable for the following components:      Result Value  Glucose, Bld 272 (*)    Alkaline Phosphatase 155 (*)    All other components within normal limits  CBC WITH DIFFERENTIAL/PLATELET - Abnormal; Notable for the following components:   WBC 11.5 (*)    All other components within normal limits  URINALYSIS, ROUTINE W REFLEX MICROSCOPIC - Abnormal; Notable for the following components:   APPearance CLOUDY (*)    Glucose, UA >=500 (*)    Hgb urine dipstick LARGE (*)    Protein, ur 30 (*)    All other components within normal limits    EKG None  Radiology CT Renal Stone Study Result Date: 03/20/2024 CLINICAL DATA:  Abdominal/flank pain, stone suspected EXAM: CT ABDOMEN AND PELVIS WITHOUT CONTRAST TECHNIQUE: Multidetector CT imaging of the abdomen and pelvis was performed following the standard protocol without IV contrast. RADIATION DOSE REDUCTION: This exam was performed according to the departmental dose-optimization program which includes automated exposure control, adjustment of the mA and/or kV according to patient size and/or use of iterative reconstruction technique. COMPARISON:  CT abdomen pelvis 12/29/2023 FINDINGS: Lower chest: No acute abnormality. Hepatobiliary: Hepatic steatosis. Nodular hepatic contour. Cholecystectomy. No biliary dilation. Pancreas: Fatty atrophy.  No acute abnormality. Spleen: Absent spleen. Adrenals/Urinary Tract: Normal adrenal glands. 8 mm stone in the proximal left  ureter with mild upstream hydroureter. No right urinary calculi or hydronephrosis. Nondistended bladder. Stomach/Bowel: Postoperative change about the right and sigmoid colon. Small bowel-small bowel anastomosis in the anterior central abdomen. No bowel wall thickening or evidence of obstruction. Stomach is within normal limits. The appendix is not visualized. Vascular/Lymphatic: IVC filter. Mild aortic atherosclerotic calcification. No lymphadenopathy. Reproductive: Hysterectomy.  No adnexal mass. Other: No free intraperitoneal fluid or air. Musculoskeletal: No acute fracture.  Posterior fusion L4-L5. IMPRESSION: 1. 8 mm stone in the proximal left ureter with mild upstream hydroureter. 2. Hepatic steatosis. Nodular hepatic contour suggesting cirrhosis. 3. Aortic Atherosclerosis (ICD10-I70.0). Electronically Signed   By: Rozell Cornet M.D.   On: 03/20/2024 20:16    Procedures Procedures    Medications Ordered in ED Medications - No data to display  ED Course/ Medical Decision Making/ A&P                                 Medical Decision Making  This patient presents to the ED for concern of left-sided flank pain with hematuria, this involves an extensive number of treatment options, and is a complaint that carries with it a high risk of complications and morbidity.  The differential diagnosis includes nephrolithiasis, hydronephrosis, pyelonephritis, others   Co morbidities / Chronic conditions that complicate the patient evaluation  History of kidney stones, cancer   Additional history obtained:  Additional history obtained from EMR External records from outside source obtained and reviewed including oncology notes   Lab Tests:  I Ordered, and personally interpreted labs.  The pertinent results include: UA with large hemoglobin but no bacteria, negative for leukocytes; WBC 11.5   Imaging Studies ordered:  I ordered imaging studies including CT renal stone study I independently  visualized and interpreted imaging which showed  1. 8 mm stone in the proximal left ureter with mild upstream  hydroureter.  2. Hepatic steatosis. Nodular hepatic contour suggesting cirrhosis.  3. Aortic Atherosclerosis    I agree with the radiologist interpretation   Test / Admission - Considered:  Patient tolerating oral intake, pain under control at this time.  She does have an 8 mm stone  in the proximal left ureter with a mild hydroureter.  Does not appear to be infectious at this time.  With patient's pain under control and no current nausea patient appears stable for discharge home with outpatient urology follow-up.  I have explained to the patient that with the size of the stone she may need urologic intervention if she is unable to pass it on her own.  She states she has Dilaudid  and nausea medications at home.  She is unable to take anti-inflammatories due to being on blood thinners.  I will prescribe Flomax  and she may take her Dilaudid  and nausea medication at home for pain and nausea control.  Return precautions been provided including inability to urinate, intractable nausea/vomiting, intractable pain.  Patient voices understanding with plan and plans to follow-up with urology tomorrow.         Final Clinical Impression(s) / ED Diagnoses Final diagnoses:  Nephrolithiasis    Rx / DC Orders ED Discharge Orders          Ordered    tamsulosin  (FLOMAX ) 0.4 MG CAPS capsule  Daily        03/21/24 0118              Elisa Guest, PA-C 03/21/24 0119    Lindle Rhea, MD 03/22/24 [phone]

## 2024-03-21 NOTE — Discharge Instructions (Addendum)
 You were diagnosed today with a kidney stone.  I have prescribed Flomax to be taken daily.  You should take your home pain medication and nausea medication.  I have also provided contact information for urology for follow-up.  Return to the ED immediately if you develop fever, uncontrolled pain or vomiting, or other concerns.

## 2024-03-22 ENCOUNTER — Other Ambulatory Visit: Payer: Self-pay

## 2024-03-22 ENCOUNTER — Other Ambulatory Visit: Payer: Self-pay | Admitting: Hematology & Oncology

## 2024-03-22 ENCOUNTER — Other Ambulatory Visit (HOSPITAL_COMMUNITY): Payer: Self-pay

## 2024-03-22 ENCOUNTER — Other Ambulatory Visit: Payer: Self-pay | Admitting: Urology

## 2024-03-22 ENCOUNTER — Encounter: Payer: Self-pay | Admitting: Hematology & Oncology

## 2024-03-22 MED ORDER — FONDAPARINUX SODIUM 10 MG/0.8ML ~~LOC~~ SOLN
10.0000 mg | SUBCUTANEOUS | 3 refills | Status: DC
  Filled 2024-03-22: qty 67.2, 84d supply, fill #0
  Filled 2024-06-28 – 2024-07-08 (×2): qty 67.2, 84d supply, fill #1

## 2024-03-23 ENCOUNTER — Other Ambulatory Visit: Payer: Self-pay | Admitting: Family Medicine

## 2024-03-23 DIAGNOSIS — R12 Heartburn: Secondary | ICD-10-CM

## 2024-03-25 ENCOUNTER — Other Ambulatory Visit: Payer: Self-pay

## 2024-03-25 ENCOUNTER — Other Ambulatory Visit: Payer: Self-pay | Admitting: Urology

## 2024-03-25 ENCOUNTER — Encounter (HOSPITAL_COMMUNITY): Payer: Self-pay | Admitting: Urology

## 2024-03-25 ENCOUNTER — Encounter: Payer: Self-pay | Admitting: Hematology & Oncology

## 2024-03-25 ENCOUNTER — Other Ambulatory Visit (HOSPITAL_COMMUNITY): Payer: Self-pay

## 2024-03-25 NOTE — Progress Notes (Signed)
 For Anesthesia: PCP - Benjiman Bras, MD last office visit note 01/26/24 in [facility] Cardiologist - N/A Endocrinologist- [name], Julian Obey, MD last office visit 01/19/24 in [facility] Neurologist- Merriam Abbey, DO last office visit note 08/30/22 in [facility] Oncologist-Ennever, Sherryll Donald, MD Rock Chum  Bowel Prep reminder: N/A  Chest x-ray - 10/16/23 in [facility] EKG - greater than 1 year Stress Test - N/A ECHO - 12/08/2022 in [facility] Cardiac Cath - N/A Pacemaker/ICD device last checked: N/A Pacemaker orders received: N/A Device Rep notified: N/A  Spinal Cord Stimulator:N/A  Sleep Study - Yes CPAP - N/A  Fasting Blood Sugar - 200 Checks Blood Sugar __1-2___ times a day Date and result of last Hgb A1c- Unknown  Last dose of GLP1 agonist- N/A GLP1 instructions: N/A  Last dose of SGLT-2 inhibitors- N/A SGLT-2 instructions: N/A  Blood Thinner Instructions: Arixtra  continue per Dr Valeta Gaudier Aspirin  Instructions:N/A Last Dose: N/A  Activity level: unable to go up a flight of stairs without chest pain and/or shortness of breath     Anesthesia review: HTN, CAD, Hx DVT/PE, Cancer of appendic with mets , autoimmune hepatitis and DM   Patient denies shortness of breath, fever, cough and chest pain at PAT appointment   Patient verbalized understanding of instructions that were given to them at the PAT appointment. Patient was also instructed that they will need to review over the PAT instructions again at home before surgery.

## 2024-03-26 ENCOUNTER — Encounter (HOSPITAL_COMMUNITY): Payer: Self-pay | Admitting: Urology

## 2024-03-26 ENCOUNTER — Ambulatory Visit (HOSPITAL_COMMUNITY)

## 2024-03-26 ENCOUNTER — Ambulatory Visit (HOSPITAL_COMMUNITY): Admitting: Medical

## 2024-03-26 ENCOUNTER — Other Ambulatory Visit: Payer: Self-pay

## 2024-03-26 ENCOUNTER — Encounter (HOSPITAL_COMMUNITY)
Admission: RE | Disposition: A | Discharge status: Home / Self Care | Payer: Self-pay | Source: Home / Self Care | Attending: Urology

## 2024-03-26 ENCOUNTER — Other Ambulatory Visit (HOSPITAL_COMMUNITY): Payer: Self-pay

## 2024-03-26 ENCOUNTER — Ambulatory Visit (HOSPITAL_COMMUNITY)
Admission: RE | Admit: 2024-03-26 | Discharge: 2024-03-26 | Disposition: A | Discharge status: Home / Self Care | Attending: Urology | Admitting: Urology

## 2024-03-26 ENCOUNTER — Other Ambulatory Visit (HOSPITAL_BASED_OUTPATIENT_CLINIC_OR_DEPARTMENT_OTHER): Payer: Self-pay

## 2024-03-26 DIAGNOSIS — E119 Type 2 diabetes mellitus without complications: Secondary | ICD-10-CM | POA: Insufficient documentation

## 2024-03-26 DIAGNOSIS — K219 Gastro-esophageal reflux disease without esophagitis: Secondary | ICD-10-CM | POA: Diagnosis not present

## 2024-03-26 DIAGNOSIS — F419 Anxiety disorder, unspecified: Secondary | ICD-10-CM | POA: Insufficient documentation

## 2024-03-26 DIAGNOSIS — Z79899 Other long term (current) drug therapy: Secondary | ICD-10-CM | POA: Diagnosis not present

## 2024-03-26 DIAGNOSIS — I10 Essential (primary) hypertension: Secondary | ICD-10-CM | POA: Insufficient documentation

## 2024-03-26 DIAGNOSIS — M797 Fibromyalgia: Secondary | ICD-10-CM | POA: Insufficient documentation

## 2024-03-26 DIAGNOSIS — F32A Depression, unspecified: Secondary | ICD-10-CM | POA: Insufficient documentation

## 2024-03-26 DIAGNOSIS — I2510 Atherosclerotic heart disease of native coronary artery without angina pectoris: Secondary | ICD-10-CM | POA: Diagnosis not present

## 2024-03-26 DIAGNOSIS — M1990 Unspecified osteoarthritis, unspecified site: Secondary | ICD-10-CM | POA: Insufficient documentation

## 2024-03-26 DIAGNOSIS — E1169 Type 2 diabetes mellitus with other specified complication: Secondary | ICD-10-CM

## 2024-03-26 DIAGNOSIS — Z7984 Long term (current) use of oral hypoglycemic drugs: Secondary | ICD-10-CM | POA: Diagnosis not present

## 2024-03-26 DIAGNOSIS — Z86718 Personal history of other venous thrombosis and embolism: Secondary | ICD-10-CM | POA: Diagnosis not present

## 2024-03-26 DIAGNOSIS — N201 Calculus of ureter: Secondary | ICD-10-CM | POA: Insufficient documentation

## 2024-03-26 DIAGNOSIS — E1165 Type 2 diabetes mellitus with hyperglycemia: Secondary | ICD-10-CM

## 2024-03-26 DIAGNOSIS — D649 Anemia, unspecified: Secondary | ICD-10-CM | POA: Diagnosis not present

## 2024-03-26 HISTORY — DX: Noninfective gastroenteritis and colitis, unspecified: K52.9

## 2024-03-26 HISTORY — PX: CYSTOSCOPY/URETEROSCOPY/HOLMIUM LASER/STENT PLACEMENT: SHX6546

## 2024-03-26 HISTORY — DX: Benign neoplasm of cerebral meninges: D32.0

## 2024-03-26 HISTORY — DX: Nontoxic multinodular goiter: E04.2

## 2024-03-26 HISTORY — DX: Other specified postprocedural states: Z98.890

## 2024-03-26 HISTORY — DX: Gastro-esophageal reflux disease without esophagitis: K21.9

## 2024-03-26 HISTORY — DX: Benign neoplasm of pituitary gland: D35.2

## 2024-03-26 HISTORY — DX: Migraine, unspecified, not intractable, without status migrainosus: G43.909

## 2024-03-26 HISTORY — DX: Autoimmune hepatitis: K75.4

## 2024-03-26 HISTORY — DX: Personal history of other medical treatment: Z92.89

## 2024-03-26 HISTORY — DX: Nausea with vomiting, unspecified: R11.2

## 2024-03-26 HISTORY — DX: Dizziness and giddiness: R42

## 2024-03-26 SURGERY — CYSTOSCOPY/URETEROSCOPY/HOLMIUM LASER/STENT PLACEMENT
Anesthesia: General | Laterality: Left

## 2024-03-26 MED ORDER — TAMSULOSIN HCL 0.4 MG PO CAPS
0.4000 mg | ORAL_CAPSULE | Freq: Every day | ORAL | 0 refills | Status: DC
  Filled 2024-03-26: qty 30, 30d supply, fill #0

## 2024-03-26 MED ORDER — LIDOCAINE HCL (PF) 2 % IJ SOLN
INTRAMUSCULAR | Status: AC
  Filled 2024-03-26: qty 5

## 2024-03-26 MED ORDER — HYOSCYAMINE SULFATE 0.125 MG PO TBDP
0.1250 mg | ORAL_TABLET | Freq: Four times a day (QID) | ORAL | 0 refills | Status: DC | PRN
  Filled 2024-03-26: qty 20, 5d supply, fill #0

## 2024-03-26 MED ORDER — FENTANYL CITRATE (PF) 100 MCG/2ML IJ SOLN
INTRAMUSCULAR | Status: DC | PRN
  Administered 2024-03-26: 50 ug via INTRAVENOUS
  Administered 2024-03-26 (×2): 25 ug via INTRAVENOUS

## 2024-03-26 MED ORDER — MIDAZOLAM HCL 2 MG/2ML IJ SOLN
INTRAMUSCULAR | Status: AC
Start: 2024-03-26 — End: ?
  Filled 2024-03-26: qty 2

## 2024-03-26 MED ORDER — FENTANYL CITRATE PF 50 MCG/ML IJ SOSY: 25.0000 ug | PREFILLED_SYRINGE | INTRAMUSCULAR | Status: DC | PRN

## 2024-03-26 MED ORDER — DEXAMETHASONE SODIUM PHOSPHATE 10 MG/ML IJ SOLN
INTRAMUSCULAR | Status: AC
  Filled 2024-03-26: qty 1

## 2024-03-26 MED ORDER — IOHEXOL 300 MG/ML  SOLN
INTRAMUSCULAR | Status: DC | PRN
  Administered 2024-03-26: 7 mL

## 2024-03-26 MED ORDER — PROPOFOL 10 MG/ML IV BOLUS
INTRAVENOUS | Status: AC
  Filled 2024-03-26: qty 20

## 2024-03-26 MED ORDER — DEXAMETHASONE SODIUM PHOSPHATE 10 MG/ML IJ SOLN
INTRAMUSCULAR | Status: DC | PRN
  Administered 2024-03-26: 4 mg via INTRAVENOUS

## 2024-03-26 MED ORDER — OXYCODONE HCL 5 MG/5ML PO SOLN: 5.0000 mg | Freq: Once | ORAL | Status: DC | PRN

## 2024-03-26 MED ORDER — CHLORHEXIDINE GLUCONATE 0.12 % MT SOLN
15.0000 mL | Freq: Once | OROMUCOSAL | Status: AC
  Administered 2024-03-26: 15 mL via OROMUCOSAL

## 2024-03-26 MED ORDER — ONDANSETRON HCL 4 MG/2ML IJ SOLN
INTRAMUSCULAR | Status: DC | PRN
  Administered 2024-03-26: 4 mg via INTRAVENOUS

## 2024-03-26 MED ORDER — ACETAMINOPHEN 10 MG/ML IV SOLN: 1000.0000 mg | Freq: Once | INTRAVENOUS | Status: DC | PRN

## 2024-03-26 MED ORDER — DEXMEDETOMIDINE HCL IN NACL 80 MCG/20ML IV SOLN
INTRAVENOUS | Status: DC | PRN
  Administered 2024-03-26: 4 ug via INTRAVENOUS

## 2024-03-26 MED ORDER — CIPROFLOXACIN HCL 500 MG PO TABS
500.0000 mg | ORAL_TABLET | Freq: Once | ORAL | 0 refills | Status: AC
  Filled 2024-03-26: qty 1, 1d supply, fill #0

## 2024-03-26 MED ORDER — DIPHENHYDRAMINE HCL 50 MG/ML IJ SOLN
INTRAMUSCULAR | Status: DC | PRN
  Administered 2024-03-26: 25 mg via INTRAVENOUS

## 2024-03-26 MED ORDER — FENTANYL CITRATE (PF) 100 MCG/2ML IJ SOLN
INTRAMUSCULAR | Status: AC
  Filled 2024-03-26: qty 2

## 2024-03-26 MED ORDER — LIDOCAINE HCL (CARDIAC) PF 100 MG/5ML IV SOSY
PREFILLED_SYRINGE | INTRAVENOUS | Status: DC | PRN
  Administered 2024-03-26: 100 mg via INTRAVENOUS

## 2024-03-26 MED ORDER — ONDANSETRON HCL 4 MG/2ML IJ SOLN
INTRAMUSCULAR | Status: AC
  Filled 2024-03-26: qty 2

## 2024-03-26 MED ORDER — CIPROFLOXACIN IN D5W 400 MG/200ML IV SOLN
400.0000 mg | INTRAVENOUS | Status: AC
  Administered 2024-03-26: 400 mg via INTRAVENOUS
  Filled 2024-03-26: qty 200

## 2024-03-26 MED ORDER — OXYCODONE HCL 5 MG PO TABS: 5.0000 mg | ORAL_TABLET | Freq: Once | ORAL | Status: DC | PRN

## 2024-03-26 MED ORDER — DROPERIDOL 2.5 MG/ML IJ SOLN: 0.6250 mg | Freq: Once | INTRAMUSCULAR | Status: DC | PRN

## 2024-03-26 MED ORDER — SODIUM CHLORIDE 0.9 % IR SOLN
Status: DC | PRN
  Administered 2024-03-26: 3000 mL

## 2024-03-26 MED ORDER — ORAL CARE MOUTH RINSE: 15.0000 mL | Freq: Once | OROMUCOSAL | Status: AC

## 2024-03-26 MED ORDER — LACTATED RINGERS IV SOLN: INTRAVENOUS | Status: DC

## 2024-03-26 MED ORDER — DIPHENHYDRAMINE HCL 50 MG/ML IJ SOLN
INTRAMUSCULAR | Status: AC
Start: 2024-03-26 — End: ?
  Filled 2024-03-26: qty 1

## 2024-03-26 MED ORDER — PROPOFOL 10 MG/ML IV BOLUS
INTRAVENOUS | Status: DC | PRN
  Administered 2024-03-26: 200 mg via INTRAVENOUS

## 2024-03-26 MED ORDER — DEXMEDETOMIDINE HCL IN NACL 80 MCG/20ML IV SOLN
INTRAVENOUS | Status: AC
  Filled 2024-03-26: qty 20

## 2024-03-26 SURGICAL SUPPLY — 18 items
BAG URO CATCHER STRL LF (MISCELLANEOUS) ×1 IMPLANT
BASKET ZERO TIP NITINOL 2.4FR (BASKET) IMPLANT
CATH URETL OPEN 5X70 (CATHETERS) ×1 IMPLANT
CLOTH BEACON ORANGE TIMEOUT ST (SAFETY) ×1 IMPLANT
DRSG TEGADERM 2-3/8X2-3/4 SM (GAUZE/BANDAGES/DRESSINGS) IMPLANT
FIBER LASER MOSES 200 DFL (Laser) IMPLANT
GLOVE BIO SURGEON STRL SZ 6.5 (GLOVE) ×1 IMPLANT
GOWN STRL REUS W/ TWL LRG LVL3 (GOWN DISPOSABLE) ×1 IMPLANT
GUIDEWIRE STR DUAL SENSOR (WIRE) ×1 IMPLANT
KIT TURNOVER KIT A (KITS) IMPLANT
MANIFOLD NEPTUNE II (INSTRUMENTS) ×1 IMPLANT
PACK CYSTO (CUSTOM PROCEDURE TRAY) ×1 IMPLANT
SHEATH NAVIGATOR HD 11/13X28 (SHEATH) IMPLANT
SHEATH NAVIGATOR HD 11/13X36 (SHEATH) IMPLANT
STENT URET 6FRX24 CONTOUR (STENTS) IMPLANT
TRACTIP FLEXIVA PULS ID 200XHI (Laser) IMPLANT
TUBING CONNECTING 10 (TUBING) ×1 IMPLANT
TUBING UROLOGY SET (TUBING) ×1 IMPLANT

## 2024-03-26 NOTE — Progress Notes (Signed)
 Case: [id number] Date/Time: 03/26/24 1430   Procedure: CYSTOSCOPY/URETEROSCOPY/HOLMIUM LASER/STENT PLACEMENT (Left) - CYSTOSCOPY/LEFT URETEROSCOPY/HOLMIUM LASER/STENT PLACEMENT/RETROGRADE PYELOGRAM   Anesthesia type: General   Diagnosis: Ureteral calculus [N20.1]   Pre-op diagnosis: LEFT URETERAL CALCULUS   Location: [facility] ROOM 08 / [facility]   Surgeons: Roxane Copp, MD       DISCUSSION: Wendy Barber is a 63 yo female who is SDW. PMH of HTN, history of DVT/PE s/p IVC filter, meningioma, migraines, GERD, DM, anemia, fibromyalgia, arthritis, metastatic appendiceal cancer s/p HIPEC procedure in 2005 and 2022, carcinoid/neuroendocrine carcinoma, hx of chronic diarrhea.  Prior anesthesia complications include PONV.  Seen in the ED on 03/21/2024 for flank pain and diagnosed with an 8 mm left ureteral stone.  Now scheduled for procedure above.  Patient follows with oncology for history of metastatic appendiceal cancer and carcinoid/neuroendocrine carcinoma.  Last seen by Dr. Maria Shiner on 03/13/2024.  She is on Arixtra  due to history of recurrent extensive DVTs and PEs. She also has IVC filter.   Pt with hx of cerebral meningioma that has increased in size compared to prior imaging. She will be referred to Neurosurgery per Dr. Birt Bulla last note.  Hx of chronic diarrhea. Seen by ID on 5/7. Though to be functional rather than related to C.diff and advised to see GI and general surgery. She recently completed Vancomycin  taper.  LD Arixtra : Continue per Dr. Valeta Gaudier  VS: Ht 5\' 6"  (1.676 m)   Wt 108.9 kg   BMI 38.74 kg/m   PROVIDERS: PCP - Benjiman Bras, MD last office visit note 01/26/24 in [facility] Cardiologist - N/A Endocrinologist- [name], Julian Obey, MD last office visit 01/19/24 in [facility] Neurologist- Merriam Abbey, DO last office visit note 08/30/22 in [facility] Oncologist-Ennever, Sherryll Donald, MD Rock Chum  LABS: Labs reviewed: Acceptable for surgery. (all labs ordered are listed, but only abnormal  results are displayed)  Labs Reviewed - No data to display   IMAGES:  CT renal 03/20/2024:  IMPRESSION: 1. 8 mm stone in the proximal left ureter with mild upstream hydroureter. 2. Hepatic steatosis. Nodular hepatic contour suggesting cirrhosis. 3. Aortic Atherosclerosis (ICD10-I70.0).  MRI brain 02/09/2024  IMPRESSION: 1. Continued enlargement of a meningioma associated with the medial aspect of the left tentorial leaflet with a broad surface along the medial left transverse sinus and the torcula. Maximal dimension in the sagittal plane today measures 22 x 19 mm compared with 22 x 15 mm in August of 2024 and 21 x 13 mm in September of 2023. 2. Stable pituitary macro adenoma measuring 15 x 12 x 10 mm.   CTA chest 12/29/2023: IMPRESSION: 1. No pulmonary embolus. 2. Elevated right hemidiaphragm with adjacent compressive atelectasis. 3. Thoracic spondylosis. Posterior osteophytes at the T6-T7 and T7-T8 level causes mass effect on the spinal canal.  EKG: Need DOS   CV:  Echo 12/08/2022:  IMPRESSIONS    1. Left ventricular ejection fraction, by estimation, is 60 to 65%. The left ventricle has normal function. The left ventricle has no regional wall motion abnormalities. Left ventricular diastolic parameters were normal.  2. Right ventricular systolic function is normal. The right ventricular size is normal. There is normal pulmonary artery systolic pressure.  3. The mitral valve is normal in structure. Trivial mitral valve regurgitation.  4. The aortic valve is calcified. Aortic valve regurgitation is not visualized.  5. The inferior vena cava is normal in size with greater than 50% respiratory variability, suggesting right atrial pressure of 3 mmHg.  Comparison(s): The left  ventricular function is unchanged.  Past Medical History:  Diagnosis Date   Arthritis    Autoimmune hepatitis (HCC)    Back pain    Cancer (HCC)    pseudomyxoma peritonei, liver   Cancer of  appendix metastatic to intra-abdominal lymph node (HCC) 03/19/2020   Cardiomegaly 12/08/2022   without congestive failure, noted on ECHO   Cerebral meningioma (HCC)    Chronic diarrhea    Chronic fatigue syndrome    Diabetes mellitus without complication (HCC)    DVT of deep femoral vein, left (HCC) 03/19/2020   Fibromyalgia    GERD (gastroesophageal reflux disease)    Goals of care, counseling/discussion 03/19/2020   History of blood transfusion    Hypertension    Incomplete right bundle branch block (RBBB) 12/08/2023   Noted on EKG   Iron deficiency anemia due to chronic blood loss 05/14/2021   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   Migraines    Multiple thyroid  nodules    Pernicious anemia 05/14/2021   Pituitary adenoma (HCC)    PONV (postoperative nausea and vomiting)    not with recent surgeries   Presence of IVC filter 03/19/2020   Pulmonary embolism, bilateral (HCC) 03/19/2020   Rectal bleed 12/29/2023   Short bowel syndrome, unspecified    Vertigo     Past Surgical History:  Procedure Laterality Date   ABDOMINAL SURGERY  2005   cytoreductive surgery with splenectomy, [facility]   ABDOMINAL SURGERY  2022   cytoreductive surgery with splenectomy, [facility]   ACHILLES TENDON REPAIR Bilateral    ARTHROPLASTY Bilateral    Shoulder   BIOPSY THYROID      CARPAL TUNNEL RELEASE Bilateral    possibly right x2   CHOLECYSTECTOMY  07/1994   IR CATHETER TUBE CHANGE  02/02/2021   IR CATHETER TUBE CHANGE  02/25/2021   IR IMAGING GUIDED PORT INSERTION  06/20/2022   IR RADIOLOGIST EVAL & MGMT  02/24/2021   IR RADIOLOGIST EVAL & MGMT  03/10/2021   IR RADIOLOGIST EVAL & MGMT  06/22/2022   IR THROMBECT VENO MECH MOD SED  06/20/2022   IR US  GUIDE BX ASP/DRAIN  11/25/2019   IR US  GUIDE VASC ACCESS LEFT  06/20/2022   IR US  GUIDE VASC ACCESS RIGHT  06/20/2022   IR US  GUIDE VASC ACCESS RIGHT  06/20/2022   IR VENO/EXT/BI  06/20/2022   IR VENOCAVAGRAM IVC  06/20/2022   JOINT  REPLACEMENT Bilateral    knees   KNEE ARTHROSCOPY     ORIF ANKLE FRACTURE Right 08/27/2019   Procedure: OPEN REDUCTION INTERNAL FIXATION RIGHT ANKLE FRACTURE;  Surgeon: Saundra Curl, MD;  Location: [facility];  Service: Orthopedics;  Laterality: Right;   perineorrophy     SPINAL FUSION     TARSAL TUNNEL RELEASE Right    TONGUE BIOPSY     TOTAL ABDOMINAL HYSTERECTOMY Bilateral 2004   with BSO and appendectomy    MEDICATIONS: No current facility-administered medications for this encounter.    acetaminophen  (TYLENOL ) 500 MG tablet   amLODipine  (NORVASC ) 5 MG tablet   busPIRone  (BUSPAR ) 15 MG tablet   cabergoline  (DOSTINEX ) 0.5 MG tablet   citalopram  (CELEXA ) 40 MG tablet   dicyclomine  (BENTYL ) 20 MG tablet   diphenoxylate -atropine  (LOMOTIL ) 2.5-0.025 MG tablet   eszopiclone  (LUNESTA ) 2 MG TABS tablet   famotidine  (PEPCID ) 20 MG tablet   fondaparinux  (ARIXTRA ) 10 MG/0.8ML SOLN injection   glipiZIDE  (GLUCOTROL ) 5 MG tablet   hydrocortisone  (ANUSOL -HC) 2.5 % rectal cream   HYDROmorphone  (DILAUDID ) 4  MG tablet   lidocaine -hydrocortisone  (ANAMANTLE) 3-1 % KIT   loperamide  (IMODIUM ) 2 MG capsule   meclizine  (ANTIVERT ) 25 MG tablet   mirtazapine  (REMERON ) 15 MG tablet   ondansetron  (ZOFRAN ) 8 MG tablet   orphenadrine  (NORFLEX ) 100 MG tablet   pantoprazole  (PROTONIX ) 40 MG tablet   pramoxine-hydrocortisone  (PROCTOCREAM-HC) 1-1 % rectal cream   promethazine  (PHENERGAN ) 12.5 MG tablet   propranolol  (INDERAL ) 20 MG tablet   terconazole  (TERAZOL 7 ) 0.4 % vaginal cream   fluconazole  (DIFLUCAN ) 150 MG tablet   tamsulosin  (FLOMAX ) 0.4 MG CAPS capsule   vancomycin  (VANCOCIN ) 125 MG capsule

## 2024-03-26 NOTE — Discharge Instructions (Addendum)
 DISCHARGE INSTRUCTIONS FOR KIDNEY STONE/URETERAL STENT   MEDICATIONS:  1. Resume all your other meds from home  2. AZO over the counter can help with the burning/stinging when you urinate. 3. Take Cipro  one hour prior to removal of your stent.    ACTIVITY:  1. No strenuous activity x 1week  2. No driving while on narcotic pain medications  3. Drink plenty of water  4. Continue to walk at home - you can still get blood clots when you are at home, so keep active, but don't over do it.  5. May return to work/school tomorrow or when you feel ready   BATHING:  1. You can shower and we recommend daily showers  2. You have a string coming from your urethra: The stent string is attached to your ureteral stent. Do not pull on this.   SIGNS/SYMPTOMS TO CALL:  Please call us  if you have a fever greater than 101.5, uncontrolled nausea/vomiting, uncontrolled pain, dizziness, unable to urinate, bloody urine, chest pain, shortness of breath, leg swelling, leg pain, redness around wound, drainage from wound, or any other concerns or questions.   You can reach us  at [phone].   FOLLOW-UP:  1. You have a string attached to your stent, you may remove it on  Friday, June 6th. To do this, pull the string until the stent is completely removed. You may feel an odd sensation in your back.   2. Please bring your stone fragments to the office so that we can analyze them

## 2024-03-26 NOTE — Interval H&P Note (Signed)
 History and Physical Interval Note:  03/26/2024 2:10 PM  Wendy Barber  has presented today for surgery, with the diagnosis of LEFT URETERAL CALCULUS.  The various methods of treatment have been discussed with the patient and family. After consideration of risks, benefits and other options for treatment, the patient has consented to  Procedure(s) with comments: CYSTOSCOPY/URETEROSCOPY/HOLMIUM LASER/STENT PLACEMENT (Left) - CYSTOSCOPY/LEFT URETEROSCOPY/HOLMIUM LASER/STENT PLACEMENT/RETROGRADE PYELOGRAM as a surgical intervention.  The patient's history has been reviewed, patient examined, no change in status, stable for surgery.  I have reviewed the patient's chart and labs.  Questions were answered to the patient's satisfaction.     Meghann Landing D Lakesha Levinson

## 2024-03-26 NOTE — Anesthesia Postprocedure Evaluation (Signed)
 Anesthesia Post Note  Patient: Wendy Barber  Procedure(s) Performed: CYSTOSCOPY/URETEROSCOPY/HOLMIUM LASER/STENT PLACEMENT (Left)     Patient location during evaluation: PACU Anesthesia Type: General Level of consciousness: awake and alert Pain management: pain level controlled Vital Signs Assessment: post-procedure vital signs reviewed and stable Respiratory status: spontaneous breathing, nonlabored ventilation and respiratory function stable Cardiovascular status: blood pressure returned to baseline Postop Assessment: no apparent nausea or vomiting Anesthetic complications: no   No notable events documented.  Last Vitals:  Vitals:   03/26/24 1745 03/26/24 1808  BP: (!) 179/85 (!) 172/82  Pulse: (!) 53 (!) 58  Resp:  14  Temp:  36.6 C  SpO2:  95%    Last Pain:  Vitals:   03/26/24 1808  TempSrc:   PainSc: 0-No pain                 Rayfield Cairo

## 2024-03-26 NOTE — Transfer of Care (Signed)
 Immediate Anesthesia Transfer of Care Note  Patient: Wendy Barber  Procedure(s) Performed: CYSTOSCOPY/URETEROSCOPY/HOLMIUM LASER/STENT PLACEMENT (Left)  Patient Location: PACU  Anesthesia Type:General  Level of Consciousness: awake, alert , oriented, and patient cooperative  Airway & Oxygen Therapy: Patient Spontanous Breathing and Patient connected to face mask oxygen  Post-op Assessment: Report given to RN and Post -op Vital signs reviewed and stable  Post vital signs: Reviewed and stable  Last Vitals:  Vitals Value Taken Time  BP 151/102   Temp 97   Pulse 68   Resp 16   SpO2 98     Last Pain:  Vitals:   03/26/24 1244  TempSrc:   PainSc: 0-No pain      Patients Stated Pain Goal: 5 (03/25/24 1033)  Complications: No notable events documented.

## 2024-03-26 NOTE — Anesthesia Preprocedure Evaluation (Addendum)
 Anesthesia Evaluation  Patient identified by MRN, date of birth, ID band Patient awake    Reviewed: Allergy & Precautions, NPO status , Patient's Chart, lab work & pertinent test results  History of Anesthesia Complications (+) PONV and history of anesthetic complications  Airway Mallampati: III  TM Distance: >3 FB Neck ROM: Full    Dental  (+) Dental Advisory Given, Missing   Pulmonary shortness of breath and with exertion, neg sleep apnea, neg COPD, neg recent URI, PE (has IVC filter)   Pulmonary exam normal breath sounds clear to auscultation       Cardiovascular hypertension (amlodipine ), Pt. on medications (-) angina + CAD and + DVT  (-) Past MI, (-) Cardiac Stents and (-) CABG + dysrhythmias (incomplete RBBB)  Rhythm:Regular Rate:Normal  TTE 12/08/2022: IMPRESSIONS    1. Left ventricular ejection fraction, by estimation, is 60 to 65%. The  left ventricle has normal function. The left ventricle has no regional  wall motion abnormalities. Left ventricular diastolic parameters were  normal.   2. Right ventricular systolic function is normal. The right ventricular  size is normal. There is normal pulmonary artery systolic pressure.   3. The mitral valve is normal in structure. Trivial mitral valve  regurgitation.   4. The aortic valve is calcified. Aortic valve regurgitation is not  visualized.   5. The inferior vena cava is normal in size with greater than 50%  respiratory variability, suggesting right atrial pressure of 3 mmHg.     Neuro/Psych  Headaches, neg Seizures PSYCHIATRIC DISORDERS Anxiety Depression    Vertigo, meningioma followed by neurosurgery  Neuromuscular disease (spinal stenosis)    GI/Hepatic ,GERD  Medicated,,(+) Hepatitis - (autoimmune)H/o appendiceal cancer s/p HIPEC procedure x2, short bowel syndrome with chronic diarrhea   Endo/Other  diabetes, Type 2, Oral Hypoglycemic Agents  Multiple thyroid   nodules, pituitary adenoma, carcinoid/neuroendocrine carcinoma  Renal/GU Renal disease (stone)     Musculoskeletal  (+) Arthritis , Rheumatoid disorders,  Fibromyalgia -  Abdominal  (+) + obese  Peds  Hematology  (+) Blood dyscrasia, anemia Lab Results      Component                Value               Date                      WBC                      11.5 (H)            03/20/2024                HGB                      12.7                03/20/2024                HCT                      39.5                03/20/2024                MCV                      96.1  03/20/2024                PLT                      194                 03/20/2024              Anesthesia Other Findings Last fondaparinux : 03/25/2024  Reproductive/Obstetrics                              Anesthesia Physical Anesthesia Plan  ASA: 3  Anesthesia Plan: General   Post-op Pain Management:    Induction: Intravenous  PONV Risk Score and Plan: 4 or greater and Ondansetron , Dexamethasone  and Treatment may vary due to age or medical condition  Airway Management Planned: LMA  Additional Equipment:   Intra-op Plan:   Post-operative Plan: Extubation in OR  Informed Consent: I have reviewed the patients History and Physical, chart, labs and discussed the procedure including the risks, benefits and alternatives for the proposed anesthesia with the patient or authorized representative who has indicated his/her understanding and acceptance.     Dental advisory given  Plan Discussed with: Anesthesiologist and CRNA  Anesthesia Plan Comments: (See PAT note from 6/3  Risks of general anesthesia discussed including, but not limited to, sore throat, hoarse voice, chipped/damaged teeth, injury to vocal cords, nausea and vomiting, allergic reactions, lung infection, heart attack, stroke, and death. All questions answered. )         Anesthesia Quick  Evaluation

## 2024-03-26 NOTE — Anesthesia Procedure Notes (Addendum)
 Procedure Name: LMA Insertion Date/Time: 03/26/2024 3:24 PM  Performed by: Carolynn Citrin, CRNAPre-anesthesia Checklist: Emergency Drugs available, Patient identified, Suction available, Patient being monitored and Timeout performed Patient Re-evaluated:Patient Re-evaluated prior to induction Oxygen Delivery Method: Circle system utilized Preoxygenation: Pre-oxygenation with 100% oxygen Induction Type: IV induction Ventilation: Mask ventilation without difficulty LMA: LMA inserted LMA Size: 4.0 Number of attempts: 1 Placement Confirmation: positive ETCO2 Tube secured with: Tape Dental Injury: Teeth and Oropharynx as per pre-operative assessment

## 2024-03-26 NOTE — Op Note (Signed)
 Preoperative diagnosis: left ureteral calculus  Postoperative diagnosis: left ureteral calculus  Procedure:  Cystoscopy left ureteroscopy, laser lithotripsy, basket stone extraction left 58F x 24cm ureteral stent placement  left retrograde pyelography with interpretation  Surgeon: Perley Bradley, MD  Anesthesia: General  Complications: None  Intraoperative findings:  Urethral caruncle present at meatus Bilateral orthotropic ureteral orifices left retrograde pyelography demonstrated a filling defect within the left renal pelvis consistent with the patient's known calculus without other abnormalities. Bladder mucosa normal without masses   EBL: Minimal  Specimens: left ureteral calculus  Disposition of specimens: [facility] for stone analysis  Indication: Wendy Barber is a 63 y.o.   patient with an 8mm  left ureteral stone and associated left symptoms. After reviewing the management options for treatment, the patient elected to proceed with the above surgical procedure(s). We have discussed the potential benefits and risks of the procedure, side effects of the proposed treatment, the likelihood of the patient achieving the goals of the procedure, and any potential problems that might occur during the procedure or recuperation. Informed consent has been obtained.   Description of procedure:  The patient was taken to the operating room and general anesthesia was induced.  The patient was placed in the dorsal lithotomy position, prepped and draped in the usual sterile fashion, and preoperative antibiotics were administered. A preoperative time-out was performed.   Cystourethroscopy was performed.  The patient's urethra was examined and was normal. The bladder was then systematically examined in its entirety. There was no evidence for any bladder tumors, stones, or other mucosal pathology.    Attention then turned to the left ureteral orifice and a ureteral  catheter was used to intubate the ureteral orifice.  Omnipaque  contrast was injected through the ureteral catheter and a retrograde pyelogram was performed with findings as dictated above.  A 0.38 sensor guidewire was then advanced up the left ureter into the renal pelvis under fluoroscopic guidance.  A second sensor wire was advanced alongside the for sensor wire and secured.  Next a ureteral access sheath was placed over the working wire and advanced to the proximal ureter with fluoroscopic guidance.  The inner sheath and wire removed.  Flexible ureteroscopy took place and the known proximal ureteral calculus was seen in the renal pelvis.   The stone was then fragmented with the 200 micron holmium laser fiber.  All stones were then removed from the ureter with a 0 tip basket.  Reinspection of the ureter revealed no remaining visible stones or fragments.   The wire was then backloaded through the cystoscope and a ureteral stent was advance over the wire using Seldinger technique.  The stent was positioned appropriately under fluoroscopic and cystoscopic guidance.  The wire was then removed with an adequate stent curl noted in the renal pelvis as well as in the bladder.  The bladder was then emptied and the procedure ended.  The patient appeared to tolerate the procedure well and without complications.  The patient was able to be awakened and transferred to the recovery unit in satisfactory condition.   Disposition: The tether of the stent was left on and tucked inside the patient's vagina.  Instructions for removing the stent have been provided to the patient.

## 2024-03-26 NOTE — H&P (Signed)
 CC/HPI: cc: urolithiasis   03/22/24: 63 year old woman with remote history of urolithiasis presented to the ED with left flank pain found to have an 8 mm left proximal ureteral calculus. Pain has been manageable with intermittent Dilaudid . She has had nausea but no fevers or chills. Urinalysis today without signs of infection. She has a clotting disorder and is on a Arixtra  and also has an IVC filter.     ALLERGIES: erythramycine Milnacipran Penicillin Steriods Xanax    MEDICATIONS: Tamsulosin  HCl 0.4 MG Capsule  amLODIPine  Besylate 5 MG Tablet  busPIRone  HCl 15 MG Tablet 1 tablet PO Daily  Cabergoline  0.5 MG Tablet 1 tablet PO Daily  Cholestyramine 4 GM Packet 1 PO Daily  Citalopram  Hydrobromide 40 MG Tablet 1 tablet PO Daily  diazePAM  5 MG Tablet 1 tablet PO Daily  Dicyclomine  HCl 20 MG Tablet 1 tablet PO Daily  Diphenoxylate -Atropine  2.5-0.025 MG Tablet 1 tablet PO Daily  Eszopiclone  2 MG Tablet 1 tablet PO Daily  Famotidine  20 MG Tablet 1 tablet PO Daily  Fondaparinux  Sodium 10 MG/0.8ML Solution 1 PO Daily  Glimepiride  4 MG Tablet 1 tablet PO Daily  Hydrocod Poli-Chlorphe Poli ER 10-8 MG/5ML Suspension Extended Release 1 PO Daily  HYDROmorphone  HCl 4 MG Tablet 1 tablet PO Daily  Lidocaine -Hydrocortisone  Ace 3-1 % Kit  Loperamide  HCl 2 MG Capsule 1 capsule PO Daily  Meclizine  HCl 25 MG Tablet 1 tablet PO Daily  Nitrofurantoin  Monohyd Macro 100 MG Capsule 1 capsule PO Daily  Nystatin -Triamcinolone  100000-0.1 UNIT/GM-% Ointment  Ondansetron  HCl 8 MG Tablet 1 tablet PO Daily  Orphenadrine  Citrate ER 100 MG Tablet Extended Release 12 Hour 1 tablet PO Daily  Potassium Chloride  ER 10 MEQ Capsule Extended Release  Promethazine  HCl 12.5 MG Tablet 1 tablet PO Daily  Propranolol  HCl 20 MG Tablet 1 tablet PO Daily  Tamsulosin  HCl 0.4 MG Capsule 1 capsule PO Daily  valACYclovir  HCl 1 GM Tablet 1 tablet PO Daily     GU PSH: None   NON-GU PSH: Anesth, Shoulder Replacement Knee  replacement     GU PMH: Renal calculus      PMH Notes: bleeding disorders, blood clot in lungs/PE,    NON-GU PMH: Anxiety Arthritis Depression Diabetes Type 2 DVT, History GERD Hepatitis A Hypercholesterolemia Hypertension Liver Disease Malignant neoplasm of peritoneum, unspecified Osteoarthritis    FAMILY HISTORY: Blood in the urine - Mother copd - Mother Deceased - Father, Mother Kidney Cancer - Runs in Family Kidney Stones - Runs in Family   SOCIAL HISTORY: Marital Status: Married Preferred Language: English; Ethnicity: Not Hispanic Or Latino; Race: White Current Smoking Status: Patient has never smoked.   Tobacco Use Assessment Completed: Used Tobacco in last 30 days? Does not use smokeless tobacco. Has never drank.  Does not drink caffeine. Patient's occupation is/was retired?\/ disable.    REVIEW OF SYSTEMS:    GU Review Female:   Patient reports frequent urination, hard to postpone urination, burning /pain with urination, get up at night to urinate, leakage of urine, and stream starts and stops. Patient denies trouble starting your stream, have to strain to urinate, and being pregnant.  Gastrointestinal (Upper):   Patient reports nausea, vomiting, and indigestion/ heartburn.   Gastrointestinal (Lower):   Patient reports diarrhea. Patient denies constipation.  Constitutional:   Patient reports fever, night sweats, and fatigue. Patient denies weight loss.  Skin:   Patient reports itching. Patient denies skin rash/ lesion.  Eyes:   Patient denies blurred vision and double vision.  Ears/  Nose/ Throat:   Patient denies sore throat and sinus problems.  Hematologic/Lymphatic:   Patient reports easy bruising. Patient denies swollen glands.  Cardiovascular:   Patient reports leg swelling. Patient denies chest pains.  Respiratory:   Patient reports shortness of breath. Patient denies cough.  Endocrine:   Patient reports excessive thirst.   Musculoskeletal:   Patient  reports back pain and joint pain.   Neurological:   Patient reports dizziness. Patient denies headaches.  Psychologic:   Patient reports depression and anxiety.    Notes: kidney stones, blood in the urine, weak stream, urinary tract infection    VITAL SIGNS:      03/22/2024 01:41 PM  Weight 240 lb / 108.86 kg  Height 66 in / 167.64 cm  BP 166/93 mmHg  Pulse 62 /min  Temperature 97.1 F / 36.1 C  BMI 38.7 kg/m   MULTI-SYSTEM PHYSICAL EXAMINATION:    Constitutional: Well-nourished. No physical deformities. Normally developed. Good grooming.  Neck: Neck symmetrical, not swollen. Normal tracheal position.  Respiratory: No labored breathing, no use of accessory muscles.   Skin: No paleness, no jaundice, no cyanosis. No lesion, no ulcer, no rash.  Neurologic / Psychiatric: Oriented to time, oriented to place, oriented to person. No depression, no anxiety, no agitation.  Eyes: Normal conjunctivae. Normal eyelids.  Ears, Nose, Mouth, and Throat: Left ear no scars, no lesions, no masses. Right ear no scars, no lesions, no masses. Nose no scars, no lesions, no masses. Normal hearing. Normal lips.  Musculoskeletal: Normal gait and station of head and neck.     Complexity of Data:  Records Review:   Previous Hospital Records, Previous Patient Records, POC Tool  Urine Test Review:   Urinalysis  X-Ray Review: C.T. Abdomen/Pelvis: Reviewed Films. Reviewed Report. Discussed With Patient. IMPRESSION:  1. 8 mm stone in the proximal left ureter with mild upstream  hydroureter.  2. Hepatic steatosis. Nodular hepatic contour suggesting cirrhosis.  3. Aortic Atherosclerosis (ICD10-I70.0).    Electronically Signed  By: Rozell Cornet M.D.  On: 03/20/2024 20:16    PROCEDURES:          Urinalysis Dipstick Dipstick Cont'd  Color: Yellow Bilirubin: Neg mg/dL  Appearance: Clear Ketones: Neg mg/dL  Specific Gravity: 1.610 Blood: Neg ery/uL  pH: 5.5 Protein: Trace mg/dL  Glucose: 2+ mg/dL  Urobilinogen: 0.2 mg/dL    Nitrites: Neg    Leukocyte Esterase: Neg leu/uL    ASSESSMENT:      ICD-10 Details  1 GU:   Ureteral calculus - N20.1 Acute, Systemic Symptoms   PLAN:           Document Letter(s):  Created for Patient: Clinical Summary         Notes:   Urolithiasis:  - Discussed risks and benefits of left ureteroscopy with laser lithotripsy. Patient is not a candidate for ESWL based on chronic anticoagulation. We also discussed stent as a temporizing measure today. Patient wishes to proceed with ureteroscopy next week.  - Risks and benefits discussed with the patient including but elevated to pain, bleeding, infection, need for staged procedure, need for additional treatment, stent discomfort, inability to remove stone  - We also discussed return precautions

## 2024-03-27 ENCOUNTER — Encounter (HOSPITAL_COMMUNITY): Payer: Self-pay | Admitting: Urology

## 2024-03-28 ENCOUNTER — Other Ambulatory Visit: Payer: Self-pay | Admitting: Hematology & Oncology

## 2024-03-29 ENCOUNTER — Other Ambulatory Visit (HOSPITAL_COMMUNITY): Payer: Self-pay

## 2024-04-01 ENCOUNTER — Encounter

## 2024-04-01 ENCOUNTER — Inpatient Hospital Stay: Attending: Hematology & Oncology

## 2024-04-01 DIAGNOSIS — Z888 Allergy status to other drugs, medicaments and biological substances status: Secondary | ICD-10-CM | POA: Insufficient documentation

## 2024-04-01 DIAGNOSIS — M549 Dorsalgia, unspecified: Secondary | ICD-10-CM | POA: Insufficient documentation

## 2024-04-01 DIAGNOSIS — K529 Noninfective gastroenteritis and colitis, unspecified: Secondary | ICD-10-CM | POA: Insufficient documentation

## 2024-04-01 DIAGNOSIS — D510 Vitamin B12 deficiency anemia due to intrinsic factor deficiency: Secondary | ICD-10-CM | POA: Insufficient documentation

## 2024-04-01 DIAGNOSIS — R109 Unspecified abdominal pain: Secondary | ICD-10-CM | POA: Insufficient documentation

## 2024-04-01 DIAGNOSIS — Z79899 Other long term (current) drug therapy: Secondary | ICD-10-CM | POA: Insufficient documentation

## 2024-04-01 DIAGNOSIS — Z7901 Long term (current) use of anticoagulants: Secondary | ICD-10-CM | POA: Insufficient documentation

## 2024-04-01 DIAGNOSIS — C181 Malignant neoplasm of appendix: Secondary | ICD-10-CM | POA: Insufficient documentation

## 2024-04-01 DIAGNOSIS — E119 Type 2 diabetes mellitus without complications: Secondary | ICD-10-CM | POA: Insufficient documentation

## 2024-04-01 DIAGNOSIS — Z881 Allergy status to other antibiotic agents status: Secondary | ICD-10-CM | POA: Insufficient documentation

## 2024-04-01 DIAGNOSIS — Z880 Allergy status to penicillin: Secondary | ICD-10-CM | POA: Insufficient documentation

## 2024-04-01 DIAGNOSIS — R5383 Other fatigue: Secondary | ICD-10-CM | POA: Insufficient documentation

## 2024-04-01 DIAGNOSIS — I2699 Other pulmonary embolism without acute cor pulmonale: Secondary | ICD-10-CM | POA: Insufficient documentation

## 2024-04-01 DIAGNOSIS — R300 Dysuria: Secondary | ICD-10-CM | POA: Insufficient documentation

## 2024-04-01 DIAGNOSIS — C772 Secondary and unspecified malignant neoplasm of intra-abdominal lymph nodes: Secondary | ICD-10-CM | POA: Insufficient documentation

## 2024-04-03 ENCOUNTER — Encounter: Payer: Self-pay | Admitting: Hematology & Oncology

## 2024-04-04 ENCOUNTER — Ambulatory Visit: Payer: Self-pay | Admitting: Hematology & Oncology

## 2024-04-04 ENCOUNTER — Inpatient Hospital Stay

## 2024-04-04 ENCOUNTER — Ambulatory Visit (HOSPITAL_BASED_OUTPATIENT_CLINIC_OR_DEPARTMENT_OTHER)
Admission: RE | Admit: 2024-04-04 | Discharge: 2024-04-04 | Disposition: A | Discharge status: Home / Self Care | Source: Ambulatory Visit | Attending: Hematology & Oncology | Admitting: Hematology & Oncology

## 2024-04-04 VITALS — BP 111/68 | HR 73 | Temp 97.4°F | Resp 18

## 2024-04-04 DIAGNOSIS — I2699 Other pulmonary embolism without acute cor pulmonale: Secondary | ICD-10-CM | POA: Diagnosis not present

## 2024-04-04 DIAGNOSIS — E119 Type 2 diabetes mellitus without complications: Secondary | ICD-10-CM | POA: Diagnosis not present

## 2024-04-04 DIAGNOSIS — R109 Unspecified abdominal pain: Secondary | ICD-10-CM | POA: Diagnosis not present

## 2024-04-04 DIAGNOSIS — M549 Dorsalgia, unspecified: Secondary | ICD-10-CM | POA: Diagnosis not present

## 2024-04-04 DIAGNOSIS — Z881 Allergy status to other antibiotic agents status: Secondary | ICD-10-CM | POA: Diagnosis not present

## 2024-04-04 DIAGNOSIS — C181 Malignant neoplasm of appendix: Secondary | ICD-10-CM | POA: Diagnosis present

## 2024-04-04 DIAGNOSIS — R300 Dysuria: Secondary | ICD-10-CM | POA: Diagnosis not present

## 2024-04-04 DIAGNOSIS — K529 Noninfective gastroenteritis and colitis, unspecified: Secondary | ICD-10-CM | POA: Diagnosis not present

## 2024-04-04 DIAGNOSIS — Z888 Allergy status to other drugs, medicaments and biological substances status: Secondary | ICD-10-CM | POA: Diagnosis not present

## 2024-04-04 DIAGNOSIS — D510 Vitamin B12 deficiency anemia due to intrinsic factor deficiency: Secondary | ICD-10-CM | POA: Diagnosis present

## 2024-04-04 DIAGNOSIS — C772 Secondary and unspecified malignant neoplasm of intra-abdominal lymph nodes: Secondary | ICD-10-CM | POA: Insufficient documentation

## 2024-04-04 DIAGNOSIS — Z880 Allergy status to penicillin: Secondary | ICD-10-CM | POA: Diagnosis not present

## 2024-04-04 DIAGNOSIS — Z7901 Long term (current) use of anticoagulants: Secondary | ICD-10-CM | POA: Diagnosis not present

## 2024-04-04 DIAGNOSIS — Z79899 Other long term (current) drug therapy: Secondary | ICD-10-CM | POA: Diagnosis not present

## 2024-04-04 DIAGNOSIS — R5383 Other fatigue: Secondary | ICD-10-CM | POA: Diagnosis not present

## 2024-04-04 MED ORDER — HEPARIN SOD (PORK) LOCK FLUSH 100 UNIT/ML IV SOLN
500.0000 [IU] | Freq: Once | INTRAVENOUS | Status: AC
  Administered 2024-04-04: 500 [IU] via INTRAVENOUS

## 2024-04-04 MED ORDER — SODIUM CHLORIDE 0.9% FLUSH
10.0000 mL | INTRAVENOUS | Status: DC | PRN
  Administered 2024-04-04: 10 mL via INTRAVENOUS

## 2024-04-04 MED ORDER — IOHEXOL 300 MG/ML  SOLN
100.0000 mL | Freq: Once | INTRAMUSCULAR | Status: AC | PRN
  Administered 2024-04-04: 100 mL via INTRAVENOUS

## 2024-04-06 ENCOUNTER — Other Ambulatory Visit: Payer: Self-pay | Admitting: Hematology & Oncology

## 2024-04-06 DIAGNOSIS — Z85038 Personal history of other malignant neoplasm of large intestine: Secondary | ICD-10-CM

## 2024-04-06 DIAGNOSIS — H8112 Benign paroxysmal vertigo, left ear: Secondary | ICD-10-CM

## 2024-04-06 DIAGNOSIS — C181 Malignant neoplasm of appendix: Secondary | ICD-10-CM

## 2024-04-06 DIAGNOSIS — A0472 Enterocolitis due to Clostridium difficile, not specified as recurrent: Secondary | ICD-10-CM

## 2024-04-08 ENCOUNTER — Encounter: Payer: Self-pay | Admitting: Hematology & Oncology

## 2024-04-10 ENCOUNTER — Inpatient Hospital Stay (HOSPITAL_BASED_OUTPATIENT_CLINIC_OR_DEPARTMENT_OTHER): Admitting: Hematology & Oncology

## 2024-04-10 ENCOUNTER — Inpatient Hospital Stay

## 2024-04-10 ENCOUNTER — Encounter: Payer: Self-pay | Admitting: Hematology & Oncology

## 2024-04-10 DIAGNOSIS — C772 Secondary and unspecified malignant neoplasm of intra-abdominal lymph nodes: Secondary | ICD-10-CM | POA: Diagnosis not present

## 2024-04-10 DIAGNOSIS — E0822 Diabetes mellitus due to underlying condition with diabetic chronic kidney disease: Secondary | ICD-10-CM

## 2024-04-10 DIAGNOSIS — C181 Malignant neoplasm of appendix: Secondary | ICD-10-CM

## 2024-04-10 DIAGNOSIS — D510 Vitamin B12 deficiency anemia due to intrinsic factor deficiency: Secondary | ICD-10-CM

## 2024-04-10 LAB — IRON AND IRON BINDING CAPACITY (CC-WL,HP ONLY)
Iron: 116 ug/dL (ref 28–170)
Saturation Ratios: 29 % (ref 10.4–31.8)
TIBC: 398 ug/dL (ref 250–450)
UIBC: 282 ug/dL (ref 148–442)

## 2024-04-10 LAB — CBC WITH DIFFERENTIAL (CANCER CENTER ONLY)
Abs Immature Granulocytes: 0.03 10*3/uL (ref 0.00–0.07)
Basophils Absolute: 0.1 10*3/uL (ref 0.0–0.1)
Basophils Relative: 1 %
Eosinophils Absolute: 0.2 10*3/uL (ref 0.0–0.5)
Eosinophils Relative: 2 %
HCT: 37.5 % (ref 36.0–46.0)
Hemoglobin: 12 g/dL (ref 12.0–15.0)
Immature Granulocytes: 0 %
Lymphocytes Relative: 38 %
Lymphs Abs: 3.5 10*3/uL (ref 0.7–4.0)
MCH: 30.8 pg (ref 26.0–34.0)
MCHC: 32 g/dL (ref 30.0–36.0)
MCV: 96.4 fL (ref 80.0–100.0)
Monocytes Absolute: 0.7 10*3/uL (ref 0.1–1.0)
Monocytes Relative: 8 %
Neutro Abs: 4.8 10*3/uL (ref 1.7–7.7)
Neutrophils Relative %: 51 %
Platelet Count: 212 10*3/uL (ref 150–400)
RBC: 3.89 MIL/uL (ref 3.87–5.11)
RDW: 14 % (ref 11.5–15.5)
WBC Count: 9.3 10*3/uL (ref 4.0–10.5)
nRBC: 0 % (ref 0.0–0.2)

## 2024-04-10 LAB — CMP (CANCER CENTER ONLY)
ALT: 42 U/L (ref 0–44)
AST: 30 U/L (ref 15–41)
Albumin: 4 g/dL (ref 3.5–5.0)
Alkaline Phosphatase: 213 U/L — ABNORMAL HIGH (ref 38–126)
Anion gap: 7 (ref 5–15)
BUN: 26 mg/dL — ABNORMAL HIGH (ref 8–23)
CO2: 24 mmol/L (ref 22–32)
Calcium: 9 mg/dL (ref 8.9–10.3)
Chloride: 109 mmol/L (ref 98–111)
Creatinine: 1.05 mg/dL — ABNORMAL HIGH (ref 0.44–1.00)
GFR, Estimated: >60 mL/min (ref >60)
Glucose, Bld: 214 mg/dL — ABNORMAL HIGH (ref 70–99)
Potassium: 4.2 mmol/L (ref 3.5–5.1)
Sodium: 140 mmol/L (ref 135–145)
Total Bilirubin: 0.6 mg/dL (ref 0.0–1.2)
Total Protein: 7.1 g/dL (ref 6.5–8.1)

## 2024-04-10 LAB — FERRITIN: Ferritin: 97 ng/mL (ref 11–307)

## 2024-04-10 LAB — LACTATE DEHYDROGENASE: LDH: 198 U/L — ABNORMAL HIGH (ref 98–192)

## 2024-04-10 MED ORDER — HEPARIN SOD (PORK) LOCK FLUSH 100 UNIT/ML IV SOLN
250.0000 [IU] | Freq: Once | INTRAVENOUS | Status: DC | PRN
Start: 2024-04-10 — End: 2024-04-10

## 2024-04-10 MED ORDER — ALTEPLASE 2 MG IJ SOLR: 2.0000 mg | Freq: Once | INTRAMUSCULAR | Status: DC | PRN

## 2024-04-10 MED ORDER — SODIUM CHLORIDE 0.9% FLUSH
3.0000 mL | Freq: Once | INTRAVENOUS | Status: DC | PRN
Start: 2024-04-10 — End: 2024-04-10

## 2024-04-10 MED ORDER — ROPINIROLE HCL 1 MG PO TABS: 1.0000 mg | ORAL_TABLET | Freq: Every day | ORAL | 3 refills | Status: DC

## 2024-04-10 MED ORDER — LANREOTIDE ACETATE 120 MG/0.5ML ~~LOC~~ SOLN
120.0000 mg | Freq: Once | SUBCUTANEOUS | Status: AC
  Administered 2024-04-10: 120 mg via SUBCUTANEOUS
  Filled 2024-04-10: qty 120

## 2024-04-10 MED ORDER — HYDROMORPHONE HCL 4 MG PO TABS: 4.0000 mg | ORAL_TABLET | Freq: Four times a day (QID) | ORAL | 0 refills | Status: DC | PRN

## 2024-04-10 NOTE — Patient Instructions (Signed)
 Lanreotide Injection What is this medication? LANREOTIDE (lan REE oh tide) treats high levels of growth hormone (acromegaly). It is used when other therapies have not worked well enough or cannot be tolerated. It works by reducing the amount of growth hormone your body makes. This reduces symptoms and the risk of health problems caused by too much growth hormone, such as diabetes and heart disease. It may also be used to treat neuroendocrine tumors, a cancer of the cells that release hormones and other substances in your body. It works by slowing down the release of these substances from the cells. This slows tumor growth. It also decreases the symptoms of carcinoid syndrome, such as flushing or diarrhea. This medicine may be used for other purposes; ask your health care provider or pharmacist if you have questions. COMMON BRAND NAME(S): Somatuline [place] What should I tell my care team before I take this medication? They need to know if you have any of these conditions: Diabetes Gallbladder disease Heart disease Kidney disease Liver disease Pancreatic disease Thyroid disease An unusual or allergic reaction to lanreotide, other medications, foods, dyes, or preservatives Pregnant or trying to get pregnant Breastfeeding How should I use this medication? This medication is injected under the skin. It is given by your care team in a hospital or clinic setting. Talk to your care team about the use of this medication in children. Special care may be needed. Overdosage: If you think you have taken too much of this medicine contact a poison control center or emergency room at once. NOTE: This medicine is only for you. Do not share this medicine with others. What if I miss a dose? Keep appointments for follow-up doses. It is important not to miss your dose. Call your care team if you are unable to keep an appointment. What may interact with this medication? Bromocriptine Cyclosporine Certain  medications for blood pressure, heart disease, irregular heartbeat Certain medications for diabetes Quinidine Terfenadine This list may not describe all possible interactions. Give your health care provider a list of all the medicines, herbs, non-prescription drugs, or dietary supplements you use. Also tell them if you smoke, drink alcohol, or use illegal drugs. Some items may interact with your medicine. What should I watch for while using this medication? Visit your care team for regular checks on your progress. Tell your care team if your symptoms do not start to get better or if they get worse. Your condition will be monitored carefully while you are receiving this medication. You may need blood work while you are taking this medication. This medication may increase blood sugar. The risk may be higher in patients who already have diabetes. Ask your care team what you can do to lower your risk of diabetes while taking this medication. Talk to your care team if you wish to become pregnant or think you may be pregnant. This medication can cause serious birth defects. Do not breast-feed while taking this medication and for 6 months after stopping therapy. This medication may cause infertility. Talk to your care team if you are concerned about your fertility. What side effects may I notice from receiving this medication? Side effects that you should report to your care team as soon as possible: Allergic reactions--skin rash, itching, hives, swelling of the face, lips, tongue, or throat Gallbladder problems--severe stomach pain, nausea, vomiting, fever High blood sugar (hyperglycemia)--increased thirst or amount of urine, unusual weakness or fatigue, blurry vision Increase in blood pressure Low blood sugar (hypoglycemia)--pale, blue or purple  skin or lips, sweating, fussiness, rapid heartbeat, poor feeding, low body temperature Low thyroid levels (hypothyroidism)--unusual weakness or fatigue,  increased sensitivity to cold, constipation, hair loss, dry skin, weight gain, feelings of depression Oily or light-colored stools, diarrhea, bloating, weight loss Slow heartbeat--dizziness, feeling faint or lightheaded, confusion, trouble breathing, unusual weakness or fatigue Side effects that usually do not require medical attention (report these to your care team if they continue or are bothersome): Diarrhea Dizziness Headache Muscle spasms Nausea Pain, redness, or irritation at injection site Stomach pain This list may not describe all possible side effects. Call your doctor for medical advice about side effects. You may report side effects to FDA at 1-800-FDA-1088. Where should I keep my medication? This medication is given in a hospital or clinic. It will not be stored at home. NOTE: This sheet is a summary. It may not cover all possible information. If you have questions about this medicine, talk to your doctor, pharmacist, or health care provider.  2024 Elsevier/Gold Standard (2023-09-22 00:00:00)

## 2024-04-10 NOTE — Progress Notes (Signed)
 Hematology and Oncology Follow Up Visit  Wendy Barber [id number] 02-08-1961 63 y.o. 04/10/2024   Principle Diagnosis:  History of metastatic appendiceal cancer  -- recurrent  Superior mesenteric vein thrombus Thromboembolism of the RIGHT leg Pernicious anemia Acute pulmonary embolism-segmental right pulmonary artery Carcinoid/neuroendocrine carcinoma  Current Therapy:   HIPEC - Surgery done in [place] in 11/2020 Arixtra  10 mg subcu daily --start on 2/16 2024 Vitamin B12 5000 mcg PO daily  Somatuline 120 mg IM monthly-start on 07/12/2023     Interim History:  Wendy Barber is in for follow-up.  Wendy Barber does not feel all that great.  Surprisingly, Wendy Barber had a kidney stone that was removed from the in the left ureter.  This was done by [facility]. [facility].  This was done on 03/26/2024.  This was an 8 mm stone.  Wendy Barber has felt a little bit better since then.  Wendy Barber still has abdominal discomfort.  Wendy Barber still has diarrhea.  These are issues that Wendy Barber will never get rid of, unfortunately.  We did do a CT of the abdomen and pelvis.  This was done on 04/04/2024.  This showed some postsurgical changes in the anterior abdominal wall.  Wendy Barber has surgical changes in the abdomen pelvis.  Wendy Barber had stable masslike area of enhancement in the hepatic dome which is decreased.  Overall, I do not see anything that is obvious for cancer recurrence.  Of note, her last Chromogranin 80 was 46.  This is holding relatively stable.  I still believe that diabetes is can be her biggest problem long-term.  I does hope that her diabetes can get under better control.  I know Wendy Barber has a Endocrinologist.  I do not know Wendy Barber needs to be on insulin .  Her last TSH was okay at 2.1.  Her iron studies that we did today showed a ferritin of 82 with an iron saturation of 29%.  Her appetite is marginal.  I think Wendy Barber may have lost a little bit of weight.  Again, I just wish Wendy Barber would feel better.  Wendy Barber continues on the Arixtra .  Wendy Barber has  had no problems with respect to recurrent thromboembolic disease.  Overall, I would have to say that her performance status right now is probably ECOG 2.   Wt Readings from Last 3 Encounters:  04/10/24 236 lb (107 kg)  03/26/24 240 lb (108.9 kg)  03/20/24 230 lb 13.2 oz (104.7 kg)    Medications:  Current Outpatient Medications:    acetaminophen  (TYLENOL ) 500 MG tablet, Take 1,000 mg by mouth every 6 (six) hours as needed for moderate pain (pain score 4-6)., Disp: , Rfl:    amLODipine  (NORVASC ) 5 MG tablet, TAKE 1 TABLET(5 MG) BY MOUTH DAILY, Disp: 90 tablet, Rfl: 1   busPIRone  (BUSPAR ) 15 MG tablet, TAKE 1 TABLET(15 MG) BY MOUTH THREE TIMES DAILY, Disp: 270 tablet, Rfl: 0   cabergoline  (DOSTINEX ) 0.5 MG tablet, Take 0.5 tablets (0.25 mg total) by mouth 2 (two) times a week. (Patient taking differently: Take 0.25 mg by mouth 2 (two) times a week. Sunday Night and Wednesday night), Disp: 12 tablet, Rfl: 3   citalopram  (CELEXA ) 40 MG tablet, TAKE 1 TABLET(40 MG) BY MOUTH DAILY, Disp: 90 tablet, Rfl: 0   dicyclomine  (BENTYL ) 20 MG tablet, TAKE 1 TABLET(20 MG) BY MOUTH THREE TIMES DAILY BEFORE MEALS, Disp: 90 tablet, Rfl: 4   diphenoxylate -atropine  (LOMOTIL ) 2.5-0.025 MG tablet, TAKE 2 TABLETS BY MOUTH FOUR TIMES DAILY AS NEEDED FOR LOOSE STOOLS OR DIARRHEA, Disp: 240  tablet, Rfl: 0   eszopiclone  (LUNESTA ) 2 MG TABS tablet, TAKE 1 TABLET(2 MG) BY MOUTH AT BEDTIME AS NEEDED FOR SLEEP (Patient taking differently: Take 2 mg by mouth at bedtime.), Disp: 30 tablet, Rfl: 1   famotidine  (PEPCID ) 20 MG tablet, TAKE 1 TABLET(20 MG) BY MOUTH TWICE DAILY, Disp: 60 tablet, Rfl: 0   fondaparinux  (ARIXTRA ) 10 MG/0.8ML SOLN injection, Inject 0.8 mLs (10 mg total) into the skin daily., Disp: 72 mL, Rfl: 3   glipiZIDE  (GLUCOTROL ) 5 MG tablet, Take 1 tablet (5 mg total) by mouth 2 (two) times daily before a meal., Disp: 180 tablet, Rfl: 3   hydrocortisone  (ANUSOL -HC) 2.5 % rectal cream, PLACE RECTALLY 4 TIMES  DAILY AS NEEDED FOR HEMORRHOIDS OR ANAL ITCHING (Patient taking differently: As needed), Disp: 30 g, Rfl: 1   HYDROmorphone  (DILAUDID ) 4 MG tablet, Take 1 tablet (4 mg total) by mouth every 6 (six) hours as needed for severe pain (pain score 7-10)., Disp: 60 tablet, Rfl: 0   lidocaine -hydrocortisone  (ANAMANTLE) 3-1 % KIT, Place 1 Application rectally 2 (two) times daily. (Patient taking differently: Place 1 Application rectally 2 (two) times daily as needed (Pain).), Disp: 1 kit, Rfl: 0   loperamide  (IMODIUM ) 2 MG capsule, Take 2 capsules (4 mg total) by mouth 4 (four) times daily as needed for diarrhea or loose stools., Disp: , Rfl:    meclizine  (ANTIVERT ) 25 MG tablet, TAKE 1 TABLET(25 MG) BY MOUTH THREE TIMES DAILY AS NEEDED FOR DIZZINESS, Disp: 60 tablet, Rfl: 0   mirtazapine  (REMERON ) 15 MG tablet, Take 1 tablet (15 mg total) by mouth at bedtime., Disp: 30 tablet, Rfl: 3   ondansetron  (ZOFRAN ) 8 MG tablet, TAKE 1 TABLET(8 MG) BY MOUTH EVERY 8 HOURS AS NEEDED FOR NAUSEA OR VOMITING, Disp: 30 tablet, Rfl: 2   orphenadrine  (NORFLEX ) 100 MG tablet, TAKE 1 TABLET(100 MG) BY MOUTH AT BEDTIME AS NEEDED FOR MUSCLE SPASMS (Patient taking differently: Take 100 mg by mouth at bedtime.), Disp: 30 tablet, Rfl: 2   pantoprazole  (PROTONIX ) 40 MG tablet, TAKE 1 TABLET BY MOUTH EVERY DAY BEFORE BREAKFAST, Disp: 90 tablet, Rfl: 1   pramoxine-hydrocortisone  (PROCTOCREAM-HC) 1-1 % rectal cream, APPLY RECTALLY TO THE AFFECTED AREA TWICE DAILY (Patient taking differently: Place 1 Application rectally daily as needed for hemorrhoids or anal itching.), Disp: 30 g, Rfl: 3   promethazine  (PHENERGAN ) 12.5 MG tablet, TAKE 1 TABLET(12.5 MG) BY MOUTH EVERY 6 HOURS AS NEEDED FOR NAUSEA OR VOMITING, Disp: 90 tablet, Rfl: 3   propranolol  (INDERAL ) 20 MG tablet, TAKE 1 TABLET(20 MG) BY MOUTH TWICE DAILY, Disp: 180 tablet, Rfl: 1   tamsulosin  (FLOMAX ) 0.4 MG CAPS capsule, Take 1 capsule (0.4 mg total) by mouth at bedtime., Disp: 30  capsule, Rfl: 0   terconazole  (TERAZOL 7 ) 0.4 % vaginal cream, Apply topically twice daily for up to 7 days (Patient taking differently: Place 1 applicator vaginally 2 (two) times daily as needed.), Disp: 45 g, Rfl: 2   fluconazole  (DIFLUCAN ) 150 MG tablet, Take 150 mg by mouth once. (Patient not taking: Reported on 04/10/2024), Disp: , Rfl:    hyoscyamine  (ANASPAZ ) 0.125 MG TBDP disintergrating tablet, Place 1 tablet (0.125 mg total) under the tongue every 6 (six) hours as needed. (Patient not taking: Reported on 04/10/2024), Disp: 30 tablet, Rfl: 0   tamsulosin  (FLOMAX ) 0.4 MG CAPS capsule, Take 1 capsule (0.4 mg total) by mouth daily. (Patient not taking: Reported on 04/10/2024), Disp: 30 capsule, Rfl: 0   vancomycin  (VANCOCIN ) 125 MG  capsule, 1 p.o. every 6 hours for 10 days followed by 1 p.o. twice daily for 7 days followed by 1 p.o. daily for 7 days followed by 1 p.o. every 2 days for 4 weeks (Patient not taking: Reported on 04/10/2024), Disp: 73 capsule, Rfl: 0  Allergies:  Allergies  Allergen Reactions   Ativan [Lorazepam] Swelling and Other (See Comments)    Face & Throat Swelling  Note: tolerates midazolam  fine   Corticosteroids Other (See Comments)    Psychotic behavior    Penicillins Shortness Of Breath and Other (See Comments)    Irregular and rapid Heart Rate, too    Alprazolam Hives and Other (See Comments)    Hard to arouse, unresponsiveness also   Erythromycin Nausea And Vomiting        Gabapentin  Other (See Comments)    Made the patient feel depressed   Prednisolone Anxiety   Prednisone Anxiety and Other (See Comments)    Anxiety & Nervous Breakdown   Savella  [Milnacipran] Other (See Comments)    Reaction not noted    Past Medical History, Surgical history, Social history, and Family History were reviewed and updated.  Review of Systems: Review of Systems  Constitutional:  Positive for fatigue and unexpected weight change.  HENT:  Negative.    Eyes: Negative.    Respiratory: Negative.    Cardiovascular: Negative.   Gastrointestinal:  Positive for abdominal pain and diarrhea.  Endocrine: Negative.   Genitourinary:  Positive for dysuria.   Musculoskeletal:  Positive for back pain.  Skin: Negative.   Neurological: Negative.   Hematological: Negative.   Psychiatric/Behavioral:  Negative for depression.     Physical Exam: Vitals:   04/10/24 1250  BP: 109/62  Pulse: 65  Resp: 18  Temp: 98.2 F (36.8 C)  SpO2: 96%   Wt Readings from Last 3 Encounters:  04/10/24 236 lb (107 kg)  03/26/24 240 lb (108.9 kg)  03/20/24 230 lb 13.2 oz (104.7 kg)    Physical Exam Vitals reviewed.  HENT:     Head: Normocephalic and atraumatic.   Eyes:     Pupils: Pupils are equal, round, and reactive to light.    Cardiovascular:     Rate and Rhythm: Normal rate and regular rhythm.     Heart sounds: Normal heart sounds.  Pulmonary:     Effort: Pulmonary effort is normal.     Breath sounds: Normal breath sounds.  Abdominal:     General: Bowel sounds are normal.     Palpations: Abdomen is soft.   Musculoskeletal:        General: No tenderness or deformity. Normal range of motion.     Cervical back: Normal range of motion.  Lymphadenopathy:     Cervical: No cervical adenopathy.   Skin:    General: Skin is warm and dry.     Findings: No erythema or rash.   Neurological:     Mental Status: Wendy Barber is alert and oriented to person, place, and time.   Psychiatric:        Behavior: Behavior normal.        Thought Content: Thought content normal.        Judgment: Judgment normal.   Lab Results  Component Value Date   WBC 9.3 04/10/2024   HGB 12.0 04/10/2024   HCT 37.5 04/10/2024   MCV 96.4 04/10/2024   PLT 212 04/10/2024     Chemistry      Component Value Date/Time   NA 140 04/10/2024 1215  NA 137 04/24/2023 1437   K 4.2 04/10/2024 1215   CL 109 04/10/2024 1215   CO2 24 04/10/2024 1215   BUN 26 (H) 04/10/2024 1215   BUN 18 04/24/2023  1437   CREATININE 1.05 (H) 04/10/2024 1215   CREATININE 0.96 01/19/2024 1415      Component Value Date/Time   CALCIUM  9.0 04/10/2024 1215   ALKPHOS 213 (H) 04/10/2024 1215   AST 30 04/10/2024 1215   ALT 42 04/10/2024 1215   BILITOT 0.6 04/10/2024 1215      Impression and Plan: Wendy Barber is a very charming 63 year-old white female. Wendy Barber has recurrent appendiceal cancer.  Wendy Barber underwent a HIPEC procedure in [place] a couple years ago. Unfortunately Wendy Barber had had chronic diarrhea since including C. Diff which has been successfully treatment.    I am glad that the CT scan does not show anything that is obvious for cancer.  The Chromogranin A level is holding relatively stable.  We will see what today's level is.  I hate that Wendy Barber had at this kidney stone.  I know this was a real aggravation for her.  For right now, we will just give her the Somatuline.  Again I know Wendy Barber sees other physicians.  I do think that Wendy Barber does see doctors out of [facility].  I am glad that Dr. Valeta Gaudier of Urology was able to help with the kidney stone.  Again, I really do not see that active cancer is her biggest problem.  Unfortunate, Wendy Barber has had complications from past cancer surgery which I just do not think we will ever improve.  Maybe, if her diabetes gets better, then maybe things will settle down for her.  I know that Wendy Barber is trying hard.  I know that Wendy Barber is doing everything we have asked her to do.  I know Wendy Barber has great support from her family.  We will plan to get her back to see us  in another month or so.   Ivor Mars, MD 6/18/20251:21 PM

## 2024-04-10 NOTE — Patient Instructions (Signed)

## 2024-04-12 ENCOUNTER — Encounter: Payer: Self-pay | Admitting: *Deleted

## 2024-04-12 LAB — CHROMOGRANIN A: Chromogranin A (ng/mL): 33.2 ng/mL (ref 0.0–101.8)

## 2024-04-15 ENCOUNTER — Other Ambulatory Visit: Payer: Self-pay | Admitting: Hematology & Oncology

## 2024-04-15 DIAGNOSIS — R110 Nausea: Secondary | ICD-10-CM

## 2024-04-16 ENCOUNTER — Ambulatory Visit: Payer: Medicare Other | Admitting: Internal Medicine

## 2024-04-18 ENCOUNTER — Other Ambulatory Visit: Payer: Self-pay | Admitting: Family Medicine

## 2024-04-18 ENCOUNTER — Encounter: Payer: Self-pay | Admitting: Hematology & Oncology

## 2024-04-18 DIAGNOSIS — F411 Generalized anxiety disorder: Secondary | ICD-10-CM

## 2024-04-19 ENCOUNTER — Emergency Department (HOSPITAL_COMMUNITY)

## 2024-04-19 ENCOUNTER — Other Ambulatory Visit: Payer: Self-pay

## 2024-04-19 ENCOUNTER — Emergency Department (HOSPITAL_COMMUNITY)
Admission: EM | Admit: 2024-04-19 | Discharge: 2024-04-19 | Disposition: A | Discharge status: Home / Self Care | Attending: Emergency Medicine | Admitting: Emergency Medicine

## 2024-04-19 ENCOUNTER — Encounter (HOSPITAL_COMMUNITY): Payer: Self-pay

## 2024-04-19 DIAGNOSIS — C181 Malignant neoplasm of appendix: Secondary | ICD-10-CM | POA: Diagnosis not present

## 2024-04-19 DIAGNOSIS — M546 Pain in thoracic spine: Secondary | ICD-10-CM | POA: Diagnosis not present

## 2024-04-19 DIAGNOSIS — L02211 Cutaneous abscess of abdominal wall: Secondary | ICD-10-CM | POA: Insufficient documentation

## 2024-04-19 DIAGNOSIS — R1084 Generalized abdominal pain: Secondary | ICD-10-CM

## 2024-04-19 DIAGNOSIS — L02818 Cutaneous abscess of other sites: Secondary | ICD-10-CM

## 2024-04-19 LAB — URINALYSIS, ROUTINE W REFLEX MICROSCOPIC
Bilirubin Urine: NEGATIVE
Glucose, UA: 50 mg/dL — AB
Hgb urine dipstick: NEGATIVE
Ketones, ur: NEGATIVE mg/dL
Nitrite: NEGATIVE
Protein, ur: 30 mg/dL — AB
Specific Gravity, Urine: 1.027 (ref 1.005–1.030)
pH: 5 (ref 5.0–8.0)

## 2024-04-19 LAB — CBC
HCT: 38.8 % (ref 36.0–46.0)
Hemoglobin: 12.3 g/dL (ref 12.0–15.0)
MCH: 30.9 pg (ref 26.0–34.0)
MCHC: 31.7 g/dL (ref 30.0–36.0)
MCV: 97.5 fL (ref 80.0–100.0)
Platelets: 161 10*3/uL (ref 150–400)
RBC: 3.98 MIL/uL (ref 3.87–5.11)
RDW: 14 % (ref 11.5–15.5)
WBC: 9.9 10*3/uL (ref 4.0–10.5)
nRBC: 0 % (ref 0.0–0.2)

## 2024-04-19 LAB — BASIC METABOLIC PANEL WITH GFR
Anion gap: 10 (ref 5–15)
BUN: 22 mg/dL (ref 8–23)
CO2: 21 mmol/L — ABNORMAL LOW (ref 22–32)
Calcium: 8.9 mg/dL (ref 8.9–10.3)
Chloride: 109 mmol/L (ref 98–111)
Creatinine, Ser: 1.08 mg/dL — ABNORMAL HIGH (ref 0.44–1.00)
GFR, Estimated: 58 mL/min — ABNORMAL LOW (ref >60)
Glucose, Bld: 299 mg/dL — ABNORMAL HIGH (ref 70–99)
Potassium: 3.8 mmol/L (ref 3.5–5.1)
Sodium: 140 mmol/L (ref 135–145)

## 2024-04-19 MED ORDER — HEPARIN SOD (PORK) LOCK FLUSH 100 UNIT/ML IV SOLN
INTRAVENOUS | Status: AC
  Administered 2024-04-19: 500 [IU]
  Filled 2024-04-19: qty 5

## 2024-04-19 MED ORDER — LIDOCAINE-EPINEPHRINE (PF) 2 %-1:200000 IJ SOLN
5.0000 mL | Freq: Once | INTRAMUSCULAR | Status: AC
  Administered 2024-04-19: 5 mL via INTRADERMAL
  Filled 2024-04-19: qty 20

## 2024-04-19 MED ORDER — METRONIDAZOLE 500 MG PO TABS: 500.0000 mg | ORAL_TABLET | Freq: Two times a day (BID) | ORAL | 0 refills | Status: DC

## 2024-04-19 MED ORDER — CIPROFLOXACIN HCL 500 MG PO TABS
500.0000 mg | ORAL_TABLET | Freq: Once | ORAL | Status: AC
  Administered 2024-04-19: 500 mg via ORAL
  Filled 2024-04-19: qty 1

## 2024-04-19 MED ORDER — CIPROFLOXACIN HCL 500 MG PO TABS
500.0000 mg | ORAL_TABLET | Freq: Two times a day (BID) | ORAL | 0 refills | Status: DC
Start: 2024-04-19 — End: 2024-05-08

## 2024-04-19 MED ORDER — IOHEXOL 300 MG/ML  SOLN
75.0000 mL | Freq: Once | INTRAMUSCULAR | Status: AC | PRN
  Administered 2024-04-19: 75 mL via INTRAVENOUS

## 2024-04-19 MED ORDER — HYDROMORPHONE HCL 1 MG/ML IJ SOLN
1.0000 mg | Freq: Once | INTRAMUSCULAR | Status: AC
  Administered 2024-04-19: 1 mg via INTRAVENOUS
  Filled 2024-04-19: qty 1

## 2024-04-19 MED ORDER — METRONIDAZOLE 500 MG PO TABS
500.0000 mg | ORAL_TABLET | Freq: Once | ORAL | Status: AC
  Administered 2024-04-19: 500 mg via ORAL
  Filled 2024-04-19: qty 1

## 2024-04-19 NOTE — ED Triage Notes (Signed)
 Patient presented to ER with right flank pain and pain in umbilicus. Patient recently had left kidney stone.

## 2024-04-19 NOTE — Consult Note (Signed)
 Karagan Lehr 26-Mar-1961  [id number].    Requesting MD: Tinnie Matter, PA-C Chief Complaint/Reason for Consult: abdominal wall abscess, possible EC fistula  HPI:  Ms. Petron is a 63 yo female with an extensive surgical history. 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 She had a recurrence in 2022 and had a repeat HIPEC in February 2022, with further bowel small bowel resections and right colectomy. She had an ileocolic anastomotic leak and returned to the OR on POD4. She moved to [place]  and now follows with Dr. Timmy for surveillance. In April 2022 she was seen at [facility] for an abdominal wall abscess, for which a percutaneous drain was placed. 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 Her drain was removed on 03/10/21, at which time injection of the drain showed no residual small bowel fistula.  Two days ago she noticed a nodule near the umbilicus. It has become increasingly painful with skin redness. She has not had any drainage. She denies fevers. WBC is normal. A CT scan shows a small subcutaneous abscess, which possible communication to the underlying small bowel, which appears to be near a previous anastomosis.  ROS: Review of Systems  Constitutional:  Negative for chills and fever.  Gastrointestinal:  Negative for nausea and vomiting.    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    Cardiomegaly 12/08/2022   without congestive failure, noted on ECHO   Cerebral meningioma (HCC)    Chronic diarrhea    Chronic fatigue syndrome    Diabetes mellitus without complication (HCC)    DVT of deep femoral vein, left (HCC) 03/19/2020   Fibromyalgia    GERD (gastroesophageal reflux disease)    Goals of care, counseling/discussion 03/19/2020   History of blood transfusion    Hypertension    Incomplete right bundle branch block (RBBB) 12/08/2023   Noted on EKG   Iron deficiency anemia due to chronic blood loss 05/14/2021   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   Migraines    Multiple thyroid  nodules    Pernicious anemia 05/14/2021   Pituitary adenoma (HCC)    PONV (postoperative nausea and vomiting)    not with recent surgeries   Presence of IVC filter 03/19/2020   Pulmonary embolism, bilateral (HCC) 03/19/2020   Rectal bleed 12/29/2023   Short bowel syndrome, unspecified    Vertigo     Past Surgical History:  Procedure Laterality Date   ABDOMINAL SURGERY  2005   cytoreductive surgery with splenectomy, HIP[facility]   ABDOMINAL SURGERY  2022   cytoreductive surgery with splenectomy, [facility]   ACHILLES TENDON REPAIR Bilateral    ARTHROPLASTY Bilateral    Shoulder   BIOPSY THYROID      CARPAL TUNNEL RELEASE Bilateral    possibly right x2   CHOLECYSTECTOMY  07/1994   CYSTOSCOPY/URETEROSCOPY/HOLMIUM LASER/STENT PLACEMENT Left 03/26/2024   Procedure: CYSTOSCOPY/URETEROSCOPY/HOLMIUM LASER/STENT PLACEMENT;  Surgeon: Pace, Maryellen D,  MD;  Location: [facility];  Service: Urology;  Laterality: Left;  CYSTOSCOPY/LEFT URETEROSCOPY/HOLMIUM LASER/STENT PLACEMENT/RETROGRADE PYELOGRAM   IR CATHETER TUBE CHANGE  02/02/2021   IR CATHETER TUBE CHANGE  02/25/2021   IR IMAGING GUIDED PORT INSERTION  06/20/2022   IR RADIOLOGIST EVAL & MGMT  02/24/2021   IR RADIOLOGIST EVAL & MGMT  03/10/2021   IR RADIOLOGIST EVAL & MGMT  06/22/2022   IR THROMBECT VENO MECH MOD SED  06/20/2022   IR US   GUIDE BX ASP/DRAIN  11/25/2019   IR US  GUIDE VASC ACCESS LEFT  06/20/2022   IR US  GUIDE VASC ACCESS RIGHT  06/20/2022   IR US  GUIDE VASC ACCESS RIGHT  06/20/2022   IR VENO/EXT/BI  06/20/2022   IR VENOCAVAGRAM IVC  06/20/2022   JOINT REPLACEMENT Bilateral    knees   KNEE ARTHROSCOPY     ORIF ANKLE FRACTURE Right 08/27/2019   Procedure: OPEN REDUCTION INTERNAL FIXATION RIGHT ANKLE FRACTURE;  Surgeon: Beverley Evalene BIRCH, MD;  Location: [facility];  Service: Orthopedics;  Laterality: Right;   perineorrophy     SPINAL FUSION     TARSAL TUNNEL RELEASE Right    TONGUE BIOPSY     TOTAL ABDOMINAL HYSTERECTOMY Bilateral 2004   with BSO and appendectomy    Social History:  reports that she has never smoked. She has never used smokeless tobacco. She reports that she does not drink alcohol and does not use drugs.  Allergies:  Allergies  Allergen Reactions   Ativan [Lorazepam] Swelling and Other (See Comments)    Face & Throat Swelling  Note: tolerates midazolam  fine   Corticosteroids Other (See Comments)    Psychotic behavior    Penicillins Shortness Of Breath and Other (See Comments)    Irregular and rapid Heart Rate, too    Alprazolam Hives and Other (See Comments)    Hard to arouse, unresponsiveness also   Erythromycin Nausea And Vomiting        Gabapentin  Other (See Comments)    Made the patient feel depressed   Prednisolone Anxiety   Prednisone Anxiety and Other (See Comments)    Anxiety & Nervous Breakdown   Savella  [Milnacipran] Other (See Comments)    Reaction not noted    (Not in a hospital admission)    Physical Exam: Blood pressure (!) 145/94, pulse 60, temperature 98.3 F (36.8 C), resp. rate 18, height 5' 6 (1.676 m), weight 107 kg, SpO2 94%. General: resting comfortably, appears stated age, no apparent distress Neurological: alert and oriented, no focal deficits, cranial nerves grossly in tact HEENT: normocephalic, atraumatic CV: regular rate and  rhythm Respiratory: normal work of breathing on room air Abdomen: soft, nondistended, well-healed midline laparotomy scar. At the umbilicus and within the scar, there is an area of erythema and induration, with some fluctuance. Extremities: warm and well-perfused, no deformities, moving all extremities spontaneously Psychiatric: normal mood and affect Skin: warm and dry, no jaundice, no rashes or lesions   Results for orders placed or performed during the hospital encounter of 04/19/24 (from the past 48 hours)  Urinalysis, Routine w reflex microscopic -Urine, Clean Catch     Status: Abnormal   Collection Time: 04/19/24  2:35 PM  Result Value Ref Range   Color, Urine AMBER (A) YELLOW    Comment: BIOCHEMICALS MAY BE AFFECTED BY COLOR   APPearance CLOUDY (A) CLEAR   Specific Gravity, Urine 1.027 1.005 - 1.030   pH 5.0 5.0 - 8.0   Glucose, UA 50 (A) NEGATIVE  mg/dL   Hgb urine dipstick NEGATIVE NEGATIVE   Bilirubin Urine NEGATIVE NEGATIVE   Ketones, ur NEGATIVE NEGATIVE mg/dL   Protein, ur 30 (A) NEGATIVE mg/dL   Nitrite NEGATIVE NEGATIVE   Leukocytes,Ua TRACE (A) NEGATIVE   RBC / HPF 6-10 0 - 5 RBC/hpf   WBC, UA 11-20 0 - 5 WBC/hpf   Bacteria, UA RARE (A) NONE SEEN   Squamous Epithelial / HPF 6-10 0 - 5 /HPF   Mucus PRESENT    Hyaline Casts, UA PRESENT     Comment: Performed at [facility], [address], [place], [place] [address]  Basic metabolic panel     Status: Abnormal   Collection Time: 04/19/24  2:35 PM  Result Value Ref Range   Sodium 140 135 - 145 mmol/L   Potassium 3.8 3.5 - 5.1 mmol/L   Chloride 109 98 - 111 mmol/L   CO2 21 (L) 22 - 32 mmol/L   Glucose, Bld 299 (H) 70 - 99 mg/dL    Comment: Glucose reference range applies only to samples taken after fasting for at least 8 hours.   BUN 22 8 - 23 mg/dL   Creatinine, Ser 8.91 (H) 0.44 - 1.00 mg/dL   Calcium  8.9 8.9 - 10.3 mg/dL   GFR, Estimated 58 (L) >60 mL/min    Comment: (NOTE) Calculated  using the CKD-EPI Creatinine Equation (2021)    Anion gap 10 5 - 15    Comment: Performed at [facility], [address], [place], [place] [address]  CBC     Status: None   Collection Time: 04/19/24  2:35 PM  Result Value Ref Range   WBC 9.9 4.0 - 10.5 K/uL   RBC 3.98 3.87 - 5.11 MIL/uL   Hemoglobin 12.3 12.0 - 15.0 g/dL   HCT 61.1 63.9 - 53.9 %   MCV 97.5 80.0 - 100.0 fL   MCH 30.9 26.0 - 34.0 pg   MCHC 31.7 30.0 - 36.0 g/dL   RDW 85.9 88.4 - 84.4 %   Platelets 161 150 - 400 K/uL   nRBC 0.0 0.0 - 0.2 %    Comment: Performed at [facility], [address], [place], [place] [address]   CT ABDOMEN PELVIS W CONTRAST Result Date: 04/19/2024 CLINICAL DATA:  RIGHT flank pain. Tender umbilicus. Neuroendocrine tumor. * Tracking Code: BO * EXAM: CT ABDOMEN AND PELVIS WITH CONTRAST TECHNIQUE: Multidetector CT imaging of the abdomen and pelvis was performed using the standard protocol following bolus administration of intravenous contrast. RADIATION DOSE REDUCTION: This exam was performed according to the departmental dose-optimization program which includes automated exposure control, adjustment of the mA and/or kV according to patient size and/or use of iterative reconstruction technique. CONTRAST:  75mL OMNIPAQUE  IOHEXOL  300 MG/ML  SOLN COMPARISON:  CT 04/05/2019 FINDINGS: Lower chest: Lung bases are clear. Hepatobiliary: Lobular liver with enlarged caudate lobe. Overall low-attenuation liver. Postcholecystectomy. Pancreas: Pancreas is atrophic no duct dilatation. Spleen: Post splenectomy. Adrenals/urinary tract: Adrenal glands and kidneys are normal. The ureters and bladder normal. Stomach/Bowel: Stomach duodenum normal. There is small bowel anastomosis in the mid ventral peritoneal space. No evidence of bowel obstruction. Adjacent to this anastomosis there is a small gas collection the level the linea alba (image 52/2). There is a gas and fluid collection  extending along the ventral abdominal wound towards the umbilicus measuring 2.6 x 1.1 cm (image 56/)2. This is new from prior and consistent with subcutaneous abscess. Potential communication with the peritoneal  space with small gas collection midline between the rectus muscles (image 52/2). No intraperitoneal abscess identified. Postsurgical change at the cecum consistent with appendectom colon is normal. There is a distal rectal anastomosis additionally. Vascular/Lymphatic: Infrarenal IVC filter noted.  Lymphadenopathy Reproductive: Post hysterectomy.  Adnexa unremarkable Other: No free fluid. Musculoskeletal: No aggressive osseous lesion. IMPRESSION: 1. New subcutaneous abscess along the ventral abdominal wound. Potential communication with the peritoneal space with small gas collection at the level the linea alba. No intraperitoneal abscess identified. 2. Small bowel anastomosis in the mid ventral peritoneal space. Anastomosis is in the vicinity of the subcutaneous abscess. 3. Post splenectomy. 4. Post appendectomy and hysterectomy. 5. No evidence of metastatic disease. 6. Hepatic steatosis and cirrhosis. Electronically Signed   By: Jackquline Boxer M.D.   On: 04/19/2024 17:27      Assessment/Plan 63 yo female with an extensive abdominal surgical history due to appendiceal cancer. She presents with an abdominal wall abscess within her surgical scar, at the site of a previous EC fistula. I performed an I&D at bedside. This is highly suspicious for a recurrent fistula, although there was no frank succus draining at the time of I&D. The patient is clinically stable without signs of sepsis. Ok to discharge home on oral antibiotics, and I will arrange follow up in the office next week for a wound check. I did instruct her to return to the ED in the meantime if she has worsening pain, fevers, or develops copious drainage from the wound. She expressed understanding.   Leonor Dawn, MD [facility]  Surgery General, Hepatobiliary and Pancreatic Surgery 04/19/24 8:42 PM

## 2024-04-19 NOTE — ED Provider Notes (Signed)
 [place] EMERGENCY DEPARTMENT AT [facility] Provider Note   CSN: [id number] Arrival date & time: 04/19/24  1325     Patient presents with: Abdominal Pain and Flank Pain   Wendy Barber is a 63 y.o. 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  Reports that right flank pain has been going on for the past 5 days and constant.  Feels similar to kidney stone that she had 2 weeks ago. Had stent placement and kidney stone removal on 03/26/24  Also complains of intermittent generalized abdominal pains that feels similar to contractions.  She also has a red tender nodule around her umbilicus that she noted 2 nights ago.  She has chronic diarrhea and typically has a bowel movement 8 times a day since her surgery 3 years ago.  Also has had associated decreased appetite for the past week.  Normally takes Dilaudid  at night however has been requiring to take throughout the day as her pain has been worsening.  No fevers nor vomiting.     Abdominal Pain Flank Pain Associated symptoms include abdominal pain.       Prior to Admission medications   Medication Sig Start Date End Date Taking? Authorizing Provider  ciprofloxacin  (CIPRO ) 500 MG tablet Take 1 tablet (500 mg total) by mouth every 12 (twelve) hours. 04/19/24  Yes Minnie Tinnie BRAVO, PA  metroNIDAZOLE  (FLAGYL ) 500 MG tablet Take 1 tablet (500 mg total) by mouth 2 (two) times daily. 04/19/24  Yes Minnie Tinnie BRAVO, PA  acetaminophen  (TYLENOL ) 500 MG tablet Take 1,000 mg by mouth every 6 (six) hours as needed for moderate pain (pain score 4-6).    [provider]  amLODipine  (NORVASC ) 5 MG tablet TAKE 1 TABLET(5 MG) BY MOUTH DAILY 11/22/23    [name], MD  busPIRone  (BUSPAR ) 15 MG tablet TAKE 1 TABLET(15 MG) BY MOUTH THREE TIMES DAILY 03/19/24   Teresa Rogue A, NP  cabergoline  (DOSTINEX ) 0.5 MG tablet Take 0.5 tablets (0.25 mg total) by mouth 2 (two) times a week. Patient taking differently: Take 0.25 mg by mouth 2 (two) times a week. Sunday Night and Wednesday night 01/25/24   [name], Donell Cardinal, MD  citalopram  (CELEXA ) 40 MG tablet TAKE 1 TABLET(40 MG) BY MOUTH DAILY 02/18/24   Teresa Rogue A, NP  dicyclomine  (BENTYL ) 20 MG tablet TAKE 1 TABLET(20 MG) BY MOUTH THREE TIMES DAILY BEFORE MEALS 01/16/24   Timmy Maude SAUNDERS, MD  diphenoxylate -atropine  (LOMOTIL ) 2.5-0.025 MG tablet TAKE 2 TABLETS BY MOUTH FOUR TIMES DAILY AS NEEDED FOR LOOSE STOOLS OR DIARRHEA 04/08/24   Timmy Maude SAUNDERS, MD  eszopiclone  (LUNESTA ) 2 MG TABS tablet TAKE 1 TABLET(2 MG) BY MOUTH AT BEDTIME AS NEEDED FOR SLEEP Patient taking differently: Take 2 mg by mouth at bedtime. 03/13/24   Teresa Rogue LABOR, NP  famotidine  (PEPCID ) 20 MG tablet TAKE 1 TABLET(20 MG) BY MOUTH TWICE DAILY 03/25/24   [name], MD  fluconazole  (DIFLUCAN ) 150 MG tablet Take 150 mg by mouth once. Patient not taking: Reported on 04/10/2024 03/12/24   [provider]  fondaparinux  (ARIXTRA ) 10 MG/0.8ML SOLN injection Inject 0.8 mLs (10 mg total) into the skin daily. 03/22/24   Timmy Maude SAUNDERS, MD  glipiZIDE  (GLUCOTROL ) 5 MG tablet Take 1 tablet (5  mg total) by mouth 2 (two) times daily before a meal. 01/19/24   [name], Donell Cardinal, MD  hydrocortisone  (ANUSOL -HC) 2.5 % rectal cream PLACE RECTALLY 4 TIMES DAILY AS NEEDED FOR HEMORRHOIDS OR ANAL ITCHING Patient taking differently: As needed 10/02/23   Timmy Maude SAUNDERS, MD  HYDROmorphone  (DILAUDID ) 4 MG tablet Take 1 tablet (4 mg total) by mouth every 6 (six) hours as needed for severe pain (pain score 7-10). 04/10/24   Timmy Maude SAUNDERS, MD  hyoscyamine  (ANASPAZ ) 0.125 MG TBDP disintergrating tablet Place 1 tablet (0.125 mg  total) under the tongue every 6 (six) hours as needed. Patient not taking: Reported on 04/10/2024 03/26/24   Pace, Maryellen D, MD  lidocaine -hydrocortisone  (ANAMANTLE) 3-1 % KIT Place 1 Application rectally 2 (two) times daily. Patient taking differently: Place 1 Application rectally 2 (two) times daily as needed (Pain). 12/29/23   Elnor Jayson LABOR, DO  loperamide  (IMODIUM ) 2 MG capsule Take 2 capsules (4 mg total) by mouth 4 (four) times daily as needed for diarrhea or loose stools. 05/08/22   Cheryle Page, MD  meclizine  (ANTIVERT ) 25 MG tablet TAKE 1 TABLET(25 MG) BY MOUTH THREE TIMES DAILY AS NEEDED FOR DIZZINESS 04/08/24   Timmy Maude SAUNDERS, MD  mirtazapine  (REMERON ) 15 MG tablet Take 1 tablet (15 mg total) by mouth at bedtime. 02/13/24   Teresa Redell LABOR, NP  ondansetron  (ZOFRAN ) 8 MG tablet TAKE 1 TABLET(8 MG) BY MOUTH EVERY 8 HOURS AS NEEDED FOR NAUSEA OR VOMITING 03/28/24   Timmy Maude SAUNDERS, MD  orphenadrine  (NORFLEX ) 100 MG tablet TAKE 1 TABLET(100 MG) BY MOUTH AT BEDTIME AS NEEDED FOR MUSCLE SPASMS Patient taking differently: Take 100 mg by mouth at bedtime. 01/29/24   Timmy Maude SAUNDERS, MD  pantoprazole  (PROTONIX ) 40 MG tablet TAKE 1 TABLET BY MOUTH EVERY DAY BEFORE BREAKFAST 12/04/23   [name], MD  pramoxine-hydrocortisone  (PROCTOCREAM-HC) 1-1 % rectal cream APPLY RECTALLY TO THE AFFECTED AREA TWICE DAILY Patient taking differently: Place 1 Application rectally daily as needed for hemorrhoids or anal itching. 10/24/23   Timmy Maude SAUNDERS, MD  promethazine  (PHENERGAN ) 12.5 MG tablet TAKE 1 TABLET(12.5 MG) BY MOUTH EVERY 6 HOURS AS NEEDED FOR NAUSEA OR VOMITING 04/15/24   Timmy Maude SAUNDERS, MD  propranolol  (INDERAL ) 20 MG tablet TAKE 1 TABLET(20 MG) BY MOUTH TWICE DAILY 04/18/24   [name], MD  rOPINIRole  (REQUIP ) 1 MG tablet Take 1 tablet (1 mg total) by mouth at bedtime. 04/10/24   Timmy Maude SAUNDERS, MD  tamsulosin  (FLOMAX ) 0.4 MG CAPS capsule Take 1 capsule (0.4 mg total) by mouth  daily. Patient not taking: Reported on 04/10/2024 03/21/24 04/20/24  Logan Ubaldo NOVAK, PA-C  tamsulosin  (FLOMAX ) 0.4 MG CAPS capsule Take 1 capsule (0.4 mg total) by mouth at bedtime. 03/26/24   Pace, Maryellen D, MD  terconazole  (TERAZOL 7 ) 0.4 % vaginal cream Apply topically twice daily for up to 7 days Patient taking differently: Place 1 applicator vaginally 2 (two) times daily as needed. 03/06/24   Cleotilde Ronal RAMAN, MD  vancomycin  (VANCOCIN ) 125 MG capsule 1 p.o. every 6 hours for 10 days followed by 1 p.o. twice daily for 7 days followed by 1 p.o. daily for 7 days followed by 1 p.o. every 2 days for 4 weeks Patient not taking: Reported on 04/10/2024 01/26/24   [name], MD    Allergies: Ativan [lorazepam], Corticosteroids, Penicillins, Alprazolam, Erythromycin, Gabapentin , Prednisolone, Prednisone, and Savella  [milnacipran]    Review of Systems  Gastrointestinal:  Positive for abdominal pain.  Genitourinary:  Positive for flank pain.    Updated Vital Signs BP (!) 145/94   Pulse 60   Temp 98.3 F (36.8 C)   Resp 18   Ht 5' 6 (1.676 m)   Wt 107 kg   SpO2 94%   BMI 38.09 kg/m   Physical Exam Vitals and nursing note reviewed.  Constitutional:      General: She is not in acute distress.    Appearance: Normal appearance.  HENT:     Head: Normocephalic and atraumatic.   Eyes:     Conjunctiva/sclera: Conjunctivae normal.    Cardiovascular:     Rate and Rhythm: Normal rate.  Pulmonary:     Effort: Pulmonary effort is normal. No respiratory distress.  Abdominal:     General: A surgical scar is present. Bowel sounds are normal.     Palpations: Abdomen is soft.     Tenderness: There is generalized abdominal tenderness and tenderness in the periumbilical area. There is no right CVA tenderness, left CVA tenderness, guarding or rebound.     Comments: Tender and erythematous nodule to umbilical area as noted in media below.  No obvious area of fluctuance.  No drainage.    Musculoskeletal:     Cervical back: No tenderness, bony tenderness or crepitus. No pain with movement.     Thoracic back: Tenderness present. No bony tenderness. Normal range of motion.     Lumbar back: No tenderness or bony tenderness. Normal range of motion.   Skin:    Coloration: Skin is not jaundiced or pale.   Neurological:     Mental Status: She is alert. Mental status is at baseline.     (all labs ordered are listed, but only abnormal results are displayed) Labs Reviewed  URINALYSIS, ROUTINE W REFLEX MICROSCOPIC - Abnormal; Notable for the following components:      Result Value   Color, Urine AMBER (*)    APPearance CLOUDY (*)    Glucose, UA 50 (*)    Protein, ur 30 (*)    Leukocytes,Ua TRACE (*)    Bacteria, UA RARE (*)    All other components within normal limits  BASIC METABOLIC PANEL WITH GFR - Abnormal; Notable for the following components:   CO2 21 (*)    Glucose, Bld 299 (*)    Creatinine, Ser 1.08 (*)    GFR, Estimated 58 (*)    All other components within normal limits  CBC    EKG: None  Radiology: CT ABDOMEN PELVIS W CONTRAST Result Date: 04/19/2024 CLINICAL DATA:  RIGHT flank pain. Tender umbilicus. Neuroendocrine tumor. * Tracking Code: BO * EXAM: CT ABDOMEN AND PELVIS WITH CONTRAST TECHNIQUE: Multidetector CT imaging of the abdomen and pelvis was performed using the standard protocol following bolus administration of intravenous contrast. RADIATION DOSE REDUCTION: This exam was performed according to the departmental dose-optimization program which includes automated exposure control, adjustment of the mA and/or kV according to patient size and/or use of iterative reconstruction technique. CONTRAST:  75mL OMNIPAQUE  IOHEXOL  300 MG/ML  SOLN COMPARISON:  CT 04/05/2019 FINDINGS: Lower chest: Lung bases are clear. Hepatobiliary: Lobular liver with enlarged caudate lobe. Overall low-attenuation liver. Postcholecystectomy. Pancreas: Pancreas is atrophic no duct  dilatation. Spleen: Post splenectomy. Adrenals/urinary tract: Adrenal glands and kidneys are normal. The ureters and bladder normal. Stomach/Bowel: Stomach duodenum normal. There is small bowel anastomosis in the mid ventral peritoneal space. No evidence of bowel obstruction. Adjacent to this anastomosis there is a small  gas collection the level the linea alba (image 52/2). There is a gas and fluid collection extending along the ventral abdominal wound towards the umbilicus measuring 2.6 x 1.1 cm (image 56/)2. This is new from prior and consistent with subcutaneous abscess. Potential communication with the peritoneal space with small gas collection midline between the rectus muscles (image 52/2). No intraperitoneal abscess identified. Postsurgical change at the cecum consistent with appendectom colon is normal. There is a distal rectal anastomosis additionally. Vascular/Lymphatic: Infrarenal IVC filter noted.  Lymphadenopathy Reproductive: Post hysterectomy.  Adnexa unremarkable Other: No free fluid. Musculoskeletal: No aggressive osseous lesion. IMPRESSION: 1. New subcutaneous abscess along the ventral abdominal wound. Potential communication with the peritoneal space with small gas collection at the level the linea alba. No intraperitoneal abscess identified. 2. Small bowel anastomosis in the mid ventral peritoneal space. Anastomosis is in the vicinity of the subcutaneous abscess. 3. Post splenectomy. 4. Post appendectomy and hysterectomy. 5. No evidence of metastatic disease. 6. Hepatic steatosis and cirrhosis. Electronically Signed   By: Jackquline Boxer M.D.   On: 04/19/2024 17:27     Medications Ordered in the ED  iohexol  (OMNIPAQUE ) 300 MG/ML solution 75 mL (75 mLs Intravenous Contrast Given 04/19/24 1647)  HYDROmorphone  (DILAUDID ) injection 1 mg (1 mg Intravenous Given 04/19/24 1722)  lidocaine -EPINEPHrine  (XYLOCAINE  W/EPI) 2 %-1:200000 (PF) injection 5 mL (5 mLs Intradermal Given by Other 04/19/24 1859)   ciprofloxacin  (CIPRO ) tablet 500 mg (500 mg Oral Given 04/19/24 2129)  metroNIDAZOLE  (FLAGYL ) tablet 500 mg (500 mg Oral Given 04/19/24 2129)  heparin  lock flush 100 UNIT/ML injection (500 Units  Given 04/19/24 2129)                                    Medical Decision Making Amount and/or Complexity of Data Reviewed Labs: ordered. Radiology: ordered.  Risk Prescription drug management.   Patient presents to the ED for concern of abd pain, right flank pain, this involves an extensive number of treatment options, and is a complaint that carries with it a high risk of complications and morbidity.  The differential diagnosis includes kidney stone, infected stone, UTI, MSK, abscess, fistula, perforation, obstruction   Co morbidities that complicate the patient evaluation  Multiple.  See HPI   Additional history obtained:  Additional history obtained from Nursing, Outside Medical Records, and Past Admission   External records from outside source obtained and reviewed including triage RN note, general surgery note from 12/2023   Lab Tests:  I Ordered, and personally interpreted labs.  The pertinent results include:   UA contaminated but does not appear infected.  No hemoglobin. CBG 299 Creatinine 1.08 No leukocytosis   Imaging Studies ordered:  I ordered imaging studies including CT abd pelvis  I independently visualized and interpreted imaging which showed  New subcutaneous abscess along the ventral abdominal wound. Potential communication with the peritoneal space with small gas collection at the level the linea alba. No intraperitoneal abscess identified. Small bowel anastomosis in the mid ventral peritoneal space. Anastomosis is in the vicinity of the subcutaneous abscess I agree with the radiologist interpretation     Medicines ordered and prescription drug management:  I ordered medication including augmentin  for abscess  Reevaluation of the patient after these  medicines showed that the patient stayed the same I have reviewed the patients home medicines and have made adjustments as needed     Consultations Obtained:  I requested  consultation with general surgery Dr. Leonor Dawn,  and discussed lab and imaging findings as well as pertinent plan  Dr. Dawn individually assessed patient and I&D site Recommends outpatient f/u with Cipro , Flagyl    Problem List / ED Course:  Right flank pain UA contaminated but does not appear infectious with no urinary symptoms so low suspicion for UTI UA without Hgb, CT without stone noted No recent fall so have low suspicion for traumatic injury.  No crepitus, deformity, step-off to spine.  No midline tenderness. No CVA tenderness.  Does have mild TTP of right paraspinous musculature in thoracic region which may be related to muscle strain as it worsens with palpation, bending, twisting.  I discussed symptomatic treatment home to include RICE, lidocaine  patches Patient has Dilaudid  at home for chronic pain Generalized abdominal pain Chronically has abdominal pain secondary to extensive abdominal surgeries but more localized to tender nodule around umbilicus Cutaneous abscess of abdomen No leukocytosis Vital signs hemodynamically stable with no fever nor tachycardia. Individually assessed by general surgery who I indeed abscess.  Initially recommended Augmentin however patient is allergic to penicillins.  General surgery then recommended Cipro  and Flagyl  as well as 1 week follow-up.   Reevaluation:  After the interventions noted above, I reevaluated the patient and found that they have :improved    Dispostion:  After consideration of the diagnostic results and the patients response to treatment, I feel that the patent would benefit from outpatient follow up with general surgery.  Discussed ED workup, disposition, return to ED precautions with patient who expresses understanding agrees with plan.  All  questions answered to their satisfaction.  They are agreeable to plan.  Discharge instructions provided on paperwork  Final diagnoses:  Cutaneous abscess of other site  Generalized abdominal pain    ED Discharge Orders          Ordered    ciprofloxacin  (CIPRO ) 500 MG tablet  Every 12 hours        04/19/24 2106    metroNIDAZOLE  (FLAGYL ) 500 MG tablet  2 times daily        04/19/24 2106               Minnie Tinnie BRAVO, PA 04/19/24 2258    Darra Fonda MATSU, MD 04/21/24 [phone]

## 2024-04-19 NOTE — Discharge Instructions (Addendum)
 Thank you for let us  evaluate you today.  We have given you a dose of antibiotics here in emergency department as well as sent a prescription to your pharmacy to pick up tomorrow.  General surgery has drained your abscess and you will follow-up with them next week as discussed  Return to emergency department if significant worsening of symptoms, abdominal pain, inability to have bowel movement in 4-7 days

## 2024-04-19 NOTE — Procedures (Signed)
 Incision and Drainage  Date/Time: 04/19/2024 8:53 PM  Performed by: Dasie Leonor CROME, MD Authorized by: Dasie Leonor CROME, MD   Consent:    Consent obtained:  Verbal   Consent given by:  Patient   Risks, benefits, and alternatives were discussed: yes   Location:    Type:  Abscess   Location:  Trunk   Trunk location:  Abdomen Pre-procedure details:    Skin preparation:  Chlorhexidine  with alcohol Sedation:    Sedation type:  None Anesthesia:    Anesthesia method:  Local infiltration   Local anesthetic:  Lidocaine  2% WITH epi (6mL) Procedure type:    Complexity:  Simple Procedure details:    Incision types:  Stab incision   Incision depth:  Subcutaneous   Wound management:  Probed and deloculated   Drainage:  Purulent   Drainage amount:  Moderate   Wound treatment:  Wound left open   Packing materials:  None Post-procedure details:    Procedure completion:  Tolerated well, no immediate complications  Abdominal wall abscess was drained at bedside. There was copious fluid but no bile or succus within the fluid to definitively suggest an enterocutaneous fistula. Patient tolerated the procedure well.

## 2024-04-22 ENCOUNTER — Ambulatory Visit: Payer: Self-pay

## 2024-04-22 NOTE — Telephone Encounter (Signed)
 Patient is heading to ED for back pain FYI

## 2024-04-22 NOTE — Telephone Encounter (Signed)
 FYI Only or Action Required?: FYI only for provider.  Patient was last seen in primary care on 01/04/2024 by [name], MD. Called Nurse Triage reporting Pain. Symptoms began several days ago. Interventions attempted: OTC medications: pain relief and Rest, hydration, or home remedies. Symptoms are: rapidly worsening.  Triage Disposition: Go to ED or PCP/Alternative with Approval  Patient/caregiver understands and will follow disposition?: Yes  Copied from CRM [id number]. Topic: Clinical - Red Word Triage >> Apr 22, 2024 11:25 AM Mercedes MATSU wrote: Red Word that prompted transfer to Nurse Triage: Patients husband called on behalf of the patient stating that she is still having the chronic back pain, after visiting the ER the patient was told to follow up with her primary doctor. Husband is concerned about possible blood clot and wants to know if they should return back to the ER. Reason for Disposition  [1] SEVERE abdominal pain AND [2] present > 1 hour  Answer Assessment - Initial Assessment Questions 1. ONSET: When did the pain begin?      Chronic -ER Friday 2. LOCATION: Where does it hurt? (upper, mid or lower back) Mid      3. SEVERITY: How bad is the pain?  (e.g., Scale 1-10; mild, moderate, or severe)   - MILD (1-3): Doesn't interfere with normal activities.    - MODERATE (4-7): Interferes with normal activities or awakens from sleep.    - SEVERE (8-10): Excruciating pain, unable to do any normal activities.      severe 4. PATTERN: Is the pain constant? (e.g., yes, no; constant, intermittent)      Constant 5. RADIATION: Does the pain shoot into your legs or somewhere else?     Back to abdomen 6. CAUSE:  What do you think is causing the back pain?      Worried for dvt 7. BACK OVERUSE:  Any recent lifting of heavy objects, strenuous work or exercise?     No  8. OTHER SYMPTOMS: Do you have any other symptoms? (e.g., fever, abdomen pain, burning with urination,  blood in urine)       Umbilical pain   Additional info:  History of blood clots, worried dvt is causing pain in her back. ER 04/19/24-Abscess of abdomen diagnosed. Has increased drainage today.  Protocols used: Back Pain-A-AH

## 2024-04-25 NOTE — Progress Notes (Signed)
 PROVIDER:  SHELBY LYNN ALLEN, MD  MRN: [id number] DOB: 1961-01-29 DATE OF ENCOUNTER: 04/25/2024 Subjective     Chief Complaint: Post Operative Visit (abscess I&D)     History of Present Illness: Wendy Barber is a 63 y.o. female who is seen today for follow up after an ED visit this past weekend. She has an extensive surgical history including HIPEC with cytoreductive surgery x2 for appendiceal cancer (2005 and 2022 in [place]). She since moved to [place]  and now follows with Dr. Timmy for surveillance. 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 This resolved without any further intervention. She presented to the ED a few nights ago with a nodule near the umbilicus within her previous incision, presumably at the same site as her prior fistula. A CT showed a subcutaneous abscess with suspicion for an underlying small bowel fistula. I performed an I&D of the abscess in the ED. She was discharged on oral antibiotics as she had no fevers or leukocytosis. She reports she had some green drainage a few days afterward, but the drainage has now significantly decreased. She changes the dressing BID and there is scant drainage on the dressing. She has also been having right sided back pain for several weeks now, which she says is the primary reason she went to the ED.      Review of Systems: A complete review of systems was obtained from the patient.  I have reviewed this information and discussed as appropriate with the patient.  See HPI as well for other ROS.    Medical History: Past Medical History:  Diagnosis Date  . Anemia   . Anxiety   . Arthritis   . Diabetes mellitus without complication (CMS/HHS-HCC)   . DVT (deep venous thrombosis) (CMS/HHS-HCC)   . GERD (gastroesophageal reflux disease)   . History of cancer   . Hypertension   . Liver disease   . Pulmonary hypertension (CMS/HHS-HCC)   . Seizures  (CMS/HHS-HCC)   . Thyroid  disease     There is no problem list on file for this patient.   Past Surgical History:  Procedure Laterality Date  . APPENDECTOMY    . CATARACT EXTRACTION    . HYSTERECTOMY    . JOINT REPLACEMENT       Allergies  Allergen Reactions  . Codeine Palpitations and Shortness Of Breath  . Corticosteroids (Glucocorticoids) Other (See Comments) and Unknown    Psychotic behavior  Other reaction(s): Other (See Comments)  Almost had nervous breakdown  Other reaction(s): Other (see comments)  Psychotic behaivor  Other reaction(s): Other (See Comments)  Psychotic behaivor  Psychotic behaivor  Psychotic behaivor  Anxiety & Nervous Breakdown.  Almost had nervous breakdown  Almost had nervous breakdown  . Lorazepam Other (See Comments) and Swelling    Facial swelling  Face & Throat Swelling  Note: tolerates midazolam  fine  Facial swelling  Previously tolerated versed  for procedural sedation  Facial swelling  Note: tolerates midazolam  fine  Face & Throat Swelling.  Facial swelling  Previously tolerated versed  for procedural sedation  Facial swelling  lorazepam  . Penicillins Palpitations, Hives, Other (See Comments), Rash and Shortness Of Breath    Rapid heartrate  Irregular and rapid Heart Rate, too  Other reaction(s): Other (See Comments)  Rapid heartrate  Other reaction(s): Irregular Heart Rate, Other (See Comments)  Rapid heartrate  Rapid heartrate  Rapid heartrate  Product containing penicillin (product)  .  Alprazolam Hives, Other (See Comments) and Unknown    unresponsive  Hard to arouse, unresponsiveness also  Hard to arouse  unresponsive  Hard to arouse  Other reaction(s): Other (See Comments)  unresponsive  Note: tolerates midazolam  fine  unresponsive  Hard to arouse  unresponsive  Hard to arouse    Other reaction(s): Other (See Comments)  unresponsive  Hard to arouse  unresponsive  unresponsive  Hard to arouse   . Erythromycin Other (See Comments) and Nausea And Vomiting    Severe stomach pain  Severe stomach pain  Abdominal pain  Abdominal pain  Other reaction(s): Other (see comments), Other (See Comments)  Severe stomach pain  Other reaction(s): Other (See Comments)  Severe stomach pain  Abdominal pain  Severe stomach pain  Abdominal pain  Severe stomach pain  Abdominal pain  erythromycin  . Gabapentin  Other (See Comments)    Made the patient feel depressed  . Iodine Other (See Comments)  . Milnacipran Anxiety, Other (See Comments) and Unknown    psychotic episode  Reaction not noted  Other reaction(s): Other (see comments)  psychotic episode  Psychotic behaivor  psychotic episode  Other reaction(s): Other (see comments)  psychotic episode  psychotic episode    Other reaction(s): Other (See Comments)  psychotic episode  Other reaction(s): Other (see comments)  psychotic episode  psychotic episode  Psychotic behaivor  Other reaction(s): Other (See Comments)  Psychotic behaivor  milnacipran hydrochloride  . Povidone-Iodine Rash    Had rash under bandage after surgery , has had betadine since without reaction  . Prednisolone Anxiety    Current Outpatient Medications on File Prior to Visit  Medication Sig Dispense Refill  . amLODIPine  (NORVASC ) 5 MG tablet Take 5 mg by mouth once daily    . buPROPion  (WELLBUTRIN  XL) 300 MG XL tablet Take 300 mg by mouth once daily    . dabigatran  (PRADAXA ) 110 mg capsule Take 110 mg by mouth 2 (two) times daily    . dicyclomine  (BENTYL ) 20 mg tablet Take 20 mg by mouth every 6 (six) hours    . diphenoxylate  HCl/atropine  (LOMOTIL  ORAL) Take by mouth    . eszopiclone  (LUNESTA ) 2 MG tablet Take 2 mg by mouth at bedtime as needed for Sleep Take immediately before bedtime.    . fondaparinux  (ARIXTRA ) 7.5 mg/0.6 mL injection Inject 7.5 mg subcutaneously once daily    . loperamide  (IMODIUM ) 2 mg capsule Take 2 mg by mouth 4 (four) times  daily as needed for Diarrhea    . meclizine  (ANTIVERT ) 25 mg tablet Take 25 mg by mouth 3 (three) times daily as needed for Dizziness    . ondansetron  (ZOFRAN ) 8 MG tablet Take 8 mg by mouth every 8 (eight) hours as needed for Nausea    . orphenadrine  (NORFLEX ) 100 mg ER tablet Take 100 mg by mouth 2 (two) times daily    . pantoprazole  (PROTONIX ) 40 MG DR tablet Take 40 mg by mouth once daily    . promethazine  (PHENERGAN ) 12.5 MG tablet Take 12.5 mg by mouth every 6 (six) hours as needed for Nausea    . propranoloL  (INDERAL ) 20 MG tablet Take 20 mg by mouth 2 (two) times daily     No current facility-administered medications on file prior to visit.    Family History  Problem Relation Age of Onset  . Obesity Mother   . High blood pressure (Hypertension) Mother   . Hyperlipidemia (Elevated cholesterol) Mother   . Coronary Artery Disease (Blocked arteries around  heart) Mother   . Deep vein thrombosis (DVT or abnormal blood clot formation) Mother      Social History   Tobacco Use  Smoking Status Never  Smokeless Tobacco Never     Social History   Socioeconomic History  . Marital status: Married  Tobacco Use  . Smoking status: Never  . Smokeless tobacco: Never  Vaping Use  . Vaping status: Never Used  Substance and Sexual Activity  . Alcohol use: Never  . Drug use: Never   Social Drivers of Corporate investment banker Strain: Patient Declined (10/16/2023)   Received from [facility]   Overall Financial Resource Strain (CARDIA)   . Difficulty of Paying Living Expenses: Patient declined  Food Insecurity: No Food Insecurity (10/16/2023)   Received from [facility]   Hunger Vital Sign   . Within the past 12 months, you worried that your food would run out before you got the money to buy more.: Never true   . Within the past 12 months, the food you bought just didn't last and you didn't have money to get more.: Never true  Transportation Needs: No Transportation Needs  (10/16/2023)   Received from [facility] - Transportation   . Lack of Transportation (Medical): No   . Lack of Transportation (Non-Medical): No  Physical Activity: Inactive (10/16/2023)   Received from [facility]   Exercise Vital Sign   . On average, how many days per week do you engage in moderate to strenuous exercise (like a brisk walk)?: 0 days   . On average, how many minutes do you engage in exercise at this level?: 20 min  Stress: Stress Concern Present (10/16/2023)   Received from [facility] - Occupational Stress Questionnaire   . Feeling of Stress : Very much  Social Connections: Socially Integrated (10/16/2023)   Received from [facility]   Social Connection and Isolation Panel   . In a typical week, how many times do you talk on the phone with family, friends, or neighbors?: More than three times a week   . How often do you get together with friends or relatives?: Once a week   . How often do you attend church or religious services?: More than 4 times per year   . Do you belong to any clubs or organizations such as church groups, unions, fraternal or athletic groups, or school groups?: No   . How often do you attend meetings of the clubs or organizations you belong to?: More than 4 times per year   . Are you married, widowed, divorced, separated, never married, or living with a partner?: Married  Housing Stability: Unknown (04/25/2024)   Housing Stability Vital Sign   . Homeless in the Last Year: No    Objective:    Vitals:   04/25/24 1534  BP: 120/81  Pulse: 78  Temp: 36.9 C (98.4 F)  SpO2: 94%  Weight: (!) 110.2 kg (243 lb)  Height: 167.6 cm (5' 6)  PainSc: 0-No pain  PainLoc: Abdomen    Body mass index is 39.22 kg/m.  Physical Exam Vitals reviewed.  Constitutional:      General: She is not in acute distress.    Appearance: Normal appearance.  Pulmonary:     Effort: Pulmonary effort is normal. No  respiratory distress.  Abdominal:     General: There is no distension.     Palpations: Abdomen is soft.  Tenderness: There is no abdominal tenderness.     Comments: Midline surgical scar. I&D site is mostly closed, but there is a small amount of thin brown/purulent drainage on dressing, suspicious for succus. No fluctuance or cellulitis.   Skin:    General: Skin is warm and dry.   Neurological:     General: No focal deficit present.     Mental Status: She is alert and oriented to person, place, and time.         Assessment and Plan:     Diagnoses and all orders for this visit:  Abdominal wall abscess  Enterocutaneous fistula     63 yo female with an extensive surgical history presenting with a recurrent abdominal wall abscess. Clinical history and presentation are highly suspicious for a recurrent enterocutaneous fistula, to the site of a previous small bowel anastomosis which underlies this area on CT. Output is currently minimal and the patient has no systemic signs of sepsis. Will continue to monitor for now. If there is no spontaneous resolution, will proceed with a CT with oral contrast to confirm the diagnosis. I will see her back in two weeks for another wound check.  Return in about 2 weeks (around 05/09/2024).   SHELBY LYNN ALLEN, MD

## 2024-04-26 ENCOUNTER — Encounter: Payer: Self-pay | Admitting: Family Medicine

## 2024-04-29 ENCOUNTER — Ambulatory Visit: Admitting: Family Medicine

## 2024-04-29 ENCOUNTER — Encounter: Payer: Self-pay | Admitting: Family Medicine

## 2024-04-29 VITALS — BP 106/76 | HR 69 | Temp 98.1°F | Ht 66.0 in | Wt 245.8 lb

## 2024-04-29 DIAGNOSIS — M48061 Spinal stenosis, lumbar region without neurogenic claudication: Secondary | ICD-10-CM

## 2024-04-29 DIAGNOSIS — E1165 Type 2 diabetes mellitus with hyperglycemia: Secondary | ICD-10-CM

## 2024-04-29 DIAGNOSIS — L02211 Cutaneous abscess of abdominal wall: Secondary | ICD-10-CM | POA: Diagnosis not present

## 2024-04-29 DIAGNOSIS — Z7984 Long term (current) use of oral hypoglycemic drugs: Secondary | ICD-10-CM

## 2024-04-29 DIAGNOSIS — M545 Low back pain, unspecified: Secondary | ICD-10-CM | POA: Diagnosis not present

## 2024-04-29 MED ORDER — CYCLOBENZAPRINE HCL 10 MG PO TABS: 5.0000 mg | ORAL_TABLET | Freq: Three times a day (TID) | ORAL | 0 refills | Status: DC | PRN

## 2024-04-29 NOTE — Progress Notes (Unsigned)
 Subjective:  Patient ID: Wendy Barber, female    DOB: Nov 11, 1960  Age: 63 y.o. MRN: [id number]  CC:  Chief Complaint  Patient presents with   Back Pain    Back pain, fistula post abscess drainage (abdominal wall abscess 04/19/24). Second fistula since procedure. Advised to change dressing daily, drain twice by surgeon    HPI Wendy Barber presents for   Abdominal pain, subcutaneous ventral abdominal wound abscess ER visit on 04/19/2024.  Complicated medical history including recurrent appendiceal cancer, Hypaque, splenectomy omentectomy, prior colonic resections, C. difficile infections, chronic diarrhea.  Presented with right flank pain and umbilical nodule.  Flank pain has been reported for the previous 5 days, similar to prior kidney stone with prior stent placement and kidney stone removal on June 3.  Reported intermittent abdominal pains with a red tender nodule around the umbilicus starting 2 days prior.  Prior Dilaudid  at night had been required during the day due to worsening pain.  Decreased appetite. UA was reported as contaminated but did not appear infected, no hemoglobin.  CBG was 299, creatinine 1.08, no leukocytosis.  CT abdomen/pelvis showed new subcutaneous abscess along a ventral abdominal wound with potential communication with the peritoneal space, no intraperitoneal abscess was identified.  Small bowel anastomosis and mid ventral peritoneal space and in the vicinity of the subcutaneous abscess.  No renal stone noted on CT.  Did have some discomfort of the right paraspinal muscles in the thoracic region, lidocaine  patches and home treatment discussed, as well as home Dilaudid  for chronic pain.  General Surgery was consulted for the abscess.  Dr. Dasie.  Bedside I&D performed.  Suspicious for recurrent fistula although there was no frank succus draining at the time of I&D.  Clinically stable without signs of sepsis, okay to discharge home on oral antibiotics, initially plan  for Augmentin but due to penicillin allergy was changed to Cipro  and Flagyl  with outpatient follow-up in the next week for wound check.  General surgery follow-up on July 3.  Reported some green drainage for a few days afterward but the drainage has significantly decreased.  Scant dressing on twice daily dressing changes.  Persistent right sided back pain for a few weeks.  Medical history and presentation still suspicious for recurrent enterocutaneous fistula to the site of a previous small bowel anastomosis which underlined the area on CT.  Minimal output at that visit, and no systemic signs of sepsis.  Continue to monitor.  If no spontaneous resolution plan for CT with oral contrast to confirm the diagnosis, 2-week follow-up for another wound check planned around July 17th.  Finished abx. Still minimal drainage. No fever.   Back pain As above presented to ER with right-sided back pain. Left ureteral calculus with ureteroscopy, laser lithotripsy and basket stone extraction on June 3, with ureteral stent placement.  Without specific urinary symptoms and 6-10 squamous cells on urinalysis, not thought to be UTI in the ER.  Trace LE, 11-20 WBC on micro with rare bacteria, 6-10 RBC, negative hemoglobin.  CT abdomen/pelvis on 04/19/2024 as above, no notation of nephroliths on adrenal/urinary tract section, no aggressive bony lesion on musculoskeletal section. CT on 04/04/2024 indicated multiple level degenerative changes of spine. CTA chest 12/29/2023 with thoracic spondylosis, posterior osteophytes at the T6-T7 and T7-T8 level causing mass effect on the spinal canal.  Noticed back pain a few days after removing stent - approximately June 6th. Pain in R side, R flank. Severe pain, worsening past week and a half. Dilaudid  helps,  but having to use during the day when only needed at night prior for abdominal pain.  Burning, sharp pain in R side. No rash/blisters.  Some hesitancy with urination, bright red blood in  urine at times, able to urinate.  Saw urology today - had in office ultrasound - no concerns in office today - note unavailable.  Tried topical voltaren  and heating pad, and spouses's flexeril . Some relief with flexeril  - has taken about 6 times.  Heating pad.  No radiating pain down legs with back pain.  No new incontinence.   Hx of recurrent DVT, PE, has IVC filter in place. Prior lumbar spine surgery L4-5 PLIF, in [place] . No local spine specialist.  CT L spine on 06/13/22: IMPRESSION: 1. Dominant findings in the retroperitoneum where there is evidence of confluent thrombus in the iliac veins and IVC below a filter. 2. No acute finding in the lumbar spine. 3. Lumbar spine degeneration and scoliosis with moderate spinal stenosis at L2-3 and L3-4.  Did not tolerate prednisone in past - anxiety and uncontrolled diabetes.  Has tolerated epidural spinal injections without difficulty previously.  Diabetes: Followed by endocrinology, for diabetes along with pituitary macroadenoma, hyperprolactinemia, thyroid  nodule.  Historically had been on basal, prandial insulin  but off insulin  since 2021, and was on glimepiride , switched to glipizide  5 mg twice daily at her March 28 visit, 46-month follow-up planned.  Blood sugar 299 on recent labs in ER on June 27, with range of 206-272 since March. Appointment with Dr. Sam on 05/20/24. Home readings in low 200's past few days.   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 02/29/2024   Hyperprolactinemia (HCC) 06/23/2023   Pituitary macroadenoma (HCC) 06/23/2023   Pituitary adenoma (HCC) 04/17/2023   Pulmonary emboli (HCC)  12/08/2022   Pulmonary embolism (HCC) 12/07/2022   Thyroid  nodule 12/07/2022   Enteritis 07/23/2022   Fall at home, initial encounter 07/23/2022   AMS (altered mental status) 07/23/2022   Hemorrhoids 06/25/2022   Occasional tremors: Essential tremors 06/23/2022   Hypomagnesemia 06/22/2022   Iron deficiency anemia    Gastritis 06/18/2022   Depression 06/18/2022   UTI (urinary tract infection) 06/16/2022   DVT, bilateral lower limbs (HCC) 06/14/2022   Symptomatic bradycardia 06/13/2022   History of excision of intestinal structure 06/13/2022   CAD (coronary artery disease) 06/13/2022   Acute lower GI bleeding 05/04/2022   AKI (acute kidney injury) (HCC) 05/04/2022   History of deep vein thrombosis (DVT) of lower extremity 05/04/2022   History of pulmonary embolus (PE) 05/04/2022   Anxiety 05/04/2022   Pernicious anemia 05/14/2021   Iron deficiency anemia due to chronic blood loss 05/14/2021   Abdominal wall abscess    Surgical wound infection 01/16/2021   C. 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   DVT of deep femoral vein, left (HCC) 03/19/2020   Pulmonary embolism, bilateral (HCC) 03/19/2020   Presence of IVC filter 03/19/2020   Hepatic encephalopathy (  HCC) 11/22/2019   Confusion 11/21/2019   Autoimmune hepatitis (HCC) 11/21/2019   Uncontrolled hypertension 11/21/2019   DM2 (diabetes mellitus, type 2) (HCC) 11/21/2019   Closed right ankle fracture 08/27/2019   Acute deep vein thrombosis (DVT) of femoral vein of left lower extremity (HCC) 08/03/2018   History of colon cancer 08/03/2018   History of partial colectomy 08/03/2018   Spinal stenosis 08/03/2018   Morbid obesity (HCC) 08/03/2018   Cerebral meningioma (HCC) 01/05/2018   Arthritis of  right shoulder region 02/18/2017   History of renal calculi 02/14/2016   Midline low back pain 02/14/2016   Class 3 obesity 01/07/2016   Migraine with aura and without status migrainosus, not intractable 01/07/2016   Chronic fatigue syndrome 09/23/2013   GERD (gastroesophageal reflux disease) 09/23/2013   History of hepatitis A 09/23/2013   Rheumatoid arthritis involving multiple sites (HCC) 09/23/2013   Past Medical History:  Diagnosis Date   Arthritis    Autoimmune hepatitis (HCC)    Back pain    Cancer (HCC)    pseudomyxoma peritonei, liver   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   Cardiomegaly 12/08/2022   without congestive failure, noted on ECHO   Cerebral meningioma (HCC)    Chronic diarrhea    Chronic fatigue syndrome    Diabetes mellitus without complication (HCC)    DVT of deep femoral vein, left (HCC) 03/19/2020   Fibromyalgia    GERD (gastroesophageal reflux disease)    Goals of care, counseling/discussion 03/19/2020   History of blood transfusion    Hypertension    Incomplete right bundle branch block (RBBB) 12/08/2023   Noted on EKG   Iron deficiency anemia due to chronic blood loss 05/14/2021   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   Migraines    Multiple thyroid  nodules    Pernicious anemia 05/14/2021   Pituitary adenoma (HCC)    PONV (postoperative nausea and vomiting)    not with recent surgeries   Presence of IVC filter 03/19/2020   Pulmonary embolism, bilateral (HCC) 03/19/2020   Rectal bleed 12/29/2023   Short bowel syndrome, unspecified    Vertigo    Past Surgical History:  Procedure Laterality Date   ABDOMINAL SURGERY  2005   cytoreductive surgery with splenectomy, [facility]   ABDOMINAL SURGERY  2022   cytoreductive surgery with splenectomy, [facility]   ACHILLES TENDON REPAIR Bilateral    ARTHROPLASTY Bilateral    Shoulder   BIOPSY THYROID      CARPAL TUNNEL RELEASE Bilateral    possibly right x2    CHOLECYSTECTOMY  07/1994   CYSTOSCOPY/URETEROSCOPY/HOLMIUM LASER/STENT PLACEMENT Left 03/26/2024   Procedure: CYSTOSCOPY/URETEROSCOPY/HOLMIUM LASER/STENT PLACEMENT;  Surgeon: Elisabeth Valli BIRCH, MD;  Location: [facility];  Service: Urology;  Laterality: Left;  CYSTOSCOPY/LEFT URETEROSCOPY/HOLMIUM LASER/STENT PLACEMENT/RETROGRADE PYELOGRAM   IR CATHETER TUBE CHANGE  02/02/2021   IR CATHETER TUBE CHANGE  02/25/2021   IR IMAGING GUIDED PORT INSERTION  06/20/2022   IR RADIOLOGIST EVAL & MGMT  02/24/2021   IR RADIOLOGIST EVAL & MGMT  03/10/2021   IR RADIOLOGIST EVAL & MGMT  06/22/2022   IR THROMBECT VENO MECH MOD SED  06/20/2022   IR US  GUIDE BX ASP/DRAIN  11/25/2019   IR US  GUIDE VASC ACCESS LEFT  06/20/2022   IR US  GUIDE VASC ACCESS RIGHT  06/20/2022   IR US  GUIDE VASC ACCESS RIGHT  06/20/2022   IR VENO/EXT/BI  06/20/2022   IR VENOCAVAGRAM IVC  06/20/2022   JOINT REPLACEMENT Bilateral    knees  KNEE ARTHROSCOPY     ORIF ANKLE FRACTURE Right 08/27/2019   Procedure: OPEN REDUCTION INTERNAL FIXATION RIGHT ANKLE FRACTURE;  Surgeon: Beverley Evalene BIRCH, MD;  Location: [facility];  Service: Orthopedics;  Laterality: Right;   perineorrophy     SPINAL FUSION     TARSAL TUNNEL RELEASE Right    TONGUE BIOPSY     TOTAL ABDOMINAL HYSTERECTOMY Bilateral 2004   with BSO and appendectomy   Allergies  Allergen Reactions   Ativan [Lorazepam] Swelling and Other (See Comments)    Face & Throat Swelling  Note: tolerates midazolam  fine   Corticosteroids Other (See Comments)    Psychotic behavior    Penicillins Shortness Of Breath and Other (See Comments)    Irregular and rapid Heart Rate, too    Alprazolam Hives and Other (See Comments)    Hard to arouse, unresponsiveness also   Erythromycin Nausea And Vomiting        Gabapentin  Other (See Comments)    Made the patient feel depressed   Prednisolone Anxiety   Prednisone Anxiety and Other (See Comments)    Anxiety & Nervous Breakdown   Savella   [Milnacipran] Other (See Comments)    Reaction not noted   Prior to Admission medications   Medication Sig Start Date End Date Taking? Authorizing Provider  acetaminophen  (TYLENOL ) 500 MG tablet Take 1,000 mg by mouth every 6 (six) hours as needed for moderate pain (pain score 4-6).    [provider]  amLODipine  (NORVASC ) 5 MG tablet TAKE 1 TABLET(5 MG) BY MOUTH DAILY 11/22/23   [name], MD  busPIRone  (BUSPAR ) 15 MG tablet TAKE 1 TABLET(15 MG) BY MOUTH THREE TIMES DAILY 03/19/24   Teresa Redell LABOR, NP  cabergoline  (DOSTINEX ) 0.5 MG tablet Take 0.5 tablets (0.25 mg total) by mouth 2 (two) times a week. Patient taking differently: Take 0.25 mg by mouth 2 (two) times a week. Sunday Night and Wednesday night 01/25/24   [name], Donell Cardinal, MD  ciprofloxacin  (CIPRO ) 500 MG tablet Take 1 tablet (500 mg total) by mouth every 12 (twelve) hours. 04/19/24   Minnie Tinnie BRAVO, PA  citalopram  (CELEXA ) 40 MG tablet TAKE 1 TABLET(40 MG) BY MOUTH DAILY 02/18/24   Teresa Redell A, NP  dicyclomine  (BENTYL ) 20 MG tablet TAKE 1 TABLET(20 MG) BY MOUTH THREE TIMES DAILY BEFORE MEALS 01/16/24   Timmy Maude SAUNDERS, MD  diphenoxylate -atropine  (LOMOTIL ) 2.5-0.025 MG tablet TAKE 2 TABLETS BY MOUTH FOUR TIMES DAILY AS NEEDED FOR LOOSE STOOLS OR DIARRHEA 04/08/24   Timmy Maude SAUNDERS, MD  eszopiclone  (LUNESTA ) 2 MG TABS tablet TAKE 1 TABLET(2 MG) BY MOUTH AT BEDTIME AS NEEDED FOR SLEEP Patient taking differently: Take 2 mg by mouth at bedtime. 03/13/24   Teresa Redell LABOR, NP  famotidine  (PEPCID ) 20 MG tablet TAKE 1 TABLET(20 MG) BY MOUTH TWICE DAILY 03/25/24   [name], MD  fluconazole  (DIFLUCAN ) 150 MG tablet Take 150 mg by mouth once. Patient not taking: Reported on 04/10/2024 03/12/24   [provider]  fondaparinux  (ARIXTRA ) 10 MG/0.8ML SOLN injection Inject 0.8 mLs (10 mg total) into the skin daily. 03/22/24   Timmy Maude SAUNDERS, MD  glipiZIDE  (GLUCOTROL ) 5 MG tablet Take 1 tablet (5 mg total) by  mouth 2 (two) times daily before a meal. 01/19/24   [name], Donell Cardinal, MD  hydrocortisone  (ANUSOL -HC) 2.5 % rectal cream PLACE RECTALLY 4 TIMES DAILY AS NEEDED FOR HEMORRHOIDS OR ANAL ITCHING Patient taking differently: As needed 10/02/23   Timmy Maude  R, MD  HYDROmorphone  (DILAUDID ) 4 MG tablet Take 1 tablet (4 mg total) by mouth every 6 (six) hours as needed for severe pain (pain score 7-10). 04/10/24   Timmy Maude SAUNDERS, MD  hyoscyamine  (ANASPAZ ) 0.125 MG TBDP disintergrating tablet Place 1 tablet (0.125 mg total) under the tongue every 6 (six) hours as needed. Patient not taking: Reported on 04/10/2024 03/26/24   Pace, Maryellen D, MD  lidocaine -hydrocortisone  (ANAMANTLE) 3-1 % KIT Place 1 Application rectally 2 (two) times daily. Patient taking differently: Place 1 Application rectally 2 (two) times daily as needed (Pain). 12/29/23   Elnor Jayson LABOR, DO  loperamide  (IMODIUM ) 2 MG capsule Take 2 capsules (4 mg total) by mouth 4 (four) times daily as needed for diarrhea or loose stools. 05/08/22   Cheryle Page, MD  meclizine  (ANTIVERT ) 25 MG tablet TAKE 1 TABLET(25 MG) BY MOUTH THREE TIMES DAILY AS NEEDED FOR DIZZINESS 04/08/24   Timmy Maude SAUNDERS, MD  metroNIDAZOLE  (FLAGYL ) 500 MG tablet Take 1 tablet (500 mg total) by mouth 2 (two) times daily. 04/19/24   Minnie Tinnie BRAVO, PA  mirtazapine  (REMERON ) 15 MG tablet Take 1 tablet (15 mg total) by mouth at bedtime. 02/13/24   Teresa Redell LABOR, NP  ondansetron  (ZOFRAN ) 8 MG tablet TAKE 1 TABLET(8 MG) BY MOUTH EVERY 8 HOURS AS NEEDED FOR NAUSEA OR VOMITING 03/28/24   Timmy Maude SAUNDERS, MD  orphenadrine  (NORFLEX ) 100 MG tablet TAKE 1 TABLET(100 MG) BY MOUTH AT BEDTIME AS NEEDED FOR MUSCLE SPASMS Patient taking differently: Take 100 mg by mouth at bedtime. 01/29/24   Timmy Maude SAUNDERS, MD  pantoprazole  (PROTONIX ) 40 MG tablet TAKE 1 TABLET BY MOUTH EVERY DAY BEFORE BREAKFAST 12/04/23   [name], MD  pramoxine-hydrocortisone  (PROCTOCREAM-HC) 1-1 % rectal  cream APPLY RECTALLY TO THE AFFECTED AREA TWICE DAILY Patient taking differently: Place 1 Application rectally daily as needed for hemorrhoids or anal itching. 10/24/23   Timmy Maude SAUNDERS, MD  promethazine  (PHENERGAN ) 12.5 MG tablet TAKE 1 TABLET(12.5 MG) BY MOUTH EVERY 6 HOURS AS NEEDED FOR NAUSEA OR VOMITING 04/15/24   Timmy Maude SAUNDERS, MD  propranolol  (INDERAL ) 20 MG tablet TAKE 1 TABLET(20 MG) BY MOUTH TWICE DAILY 04/18/24   [name], MD  rOPINIRole  (REQUIP ) 1 MG tablet Take 1 tablet (1 mg total) by mouth at bedtime. 04/10/24   Timmy Maude SAUNDERS, MD  tamsulosin  (FLOMAX ) 0.4 MG CAPS capsule Take 1 capsule (0.4 mg total) by mouth at bedtime. 03/26/24   Pace, Maryellen D, MD  terconazole  (TERAZOL 7 ) 0.4 % vaginal cream Apply topically twice daily for up to 7 days Patient taking differently: Place 1 applicator vaginally 2 (two) times daily as needed. 03/06/24   Cleotilde Ronal RAMAN, MD  vancomycin  (VANCOCIN ) 125 MG capsule 1 p.o. every 6 hours for 10 days followed by 1 p.o. twice daily for 7 days followed by 1 p.o. daily for 7 days followed by 1 p.o. every 2 days for 4 weeks Patient not taking: Reported on 04/10/2024 01/26/24   [name], MD   Social History   Socioeconomic History   Marital status: Married    Spouse name: Not on file   Number of children: 3   Years of education: Not on file   Highest education level: Some college, no degree  Occupational History   Occupation: disability  Tobacco Use   Smoking status: Never   Smokeless tobacco: Never  Vaping Use   Vaping status: Never Used  Substance and Sexual Activity  Alcohol use: No   Drug use: Never   Sexual activity: Yes    Birth control/protection: None    Comment: hysterectomy  Other Topics Concern   Not on file  Social History Narrative   Right handed   One story home   Drinks occasional caffeine   Social Drivers of Health   Financial Resource Strain: Patient Declined (10/16/2023)   Overall Financial Resource  Strain (CARDIA)    Difficulty of Paying Living Expenses: Patient declined  Food Insecurity: No Food Insecurity (10/16/2023)   Hunger Vital Sign    Worried About Running Out of Food in the Last Year: Never true    Ran Out of Food in the Last Year: Never true  Transportation Needs: No Transportation Needs (10/16/2023)   PRAPARE - Administrator, Civil Service (Medical): No    Lack of Transportation (Non-Medical): No  Physical Activity: Inactive (10/16/2023)   Exercise Vital Sign    Days of Exercise per Week: 0 days    Minutes of Exercise per Session: 20 min  Stress: Stress Concern Present (10/16/2023)   [employer] of Occupational Health - Occupational Stress Questionnaire    Feeling of Stress : Very much  Social Connections: Socially Integrated (10/16/2023)   Social Connection and Isolation Panel    Frequency of Communication with Friends and Family: More than three times a week    Frequency of Social Gatherings with Friends and Family: Once a week    Attends Religious Services: More than 4 times per year    Active Member of [employer] or Organizations: No    Attends Engineer, structural: More than 4 times per year    Marital Status: Married  Catering manager Violence: Not At Risk (03/09/2023)   Humiliation, Afraid, Rape, and Kick questionnaire    Fear of Current or Ex-Partner: No    Emotionally Abused: No    Physically Abused: No    Sexually Abused: No    Review of Systems   Objective:   Vitals:   04/29/24 1611  BP: 106/76  Pulse: 69  Temp: 98.1 F (36.7 C)  SpO2: 94%  Weight: 245 lb 12.8 oz (111.5 kg)  Height: 5' 6 (1.676 m)     Physical Exam Vitals reviewed.  Constitutional:      Appearance: Normal appearance. She is well-developed.  HENT:     Head: Normocephalic and atraumatic.  Eyes:     Conjunctiva/sclera: Conjunctivae normal.     Pupils: Pupils are equal, round, and reactive to light.  Neck:     Vascular: No carotid bruit.   Cardiovascular:     Rate and Rhythm: Normal rate and regular rhythm.     Heart sounds: Normal heart sounds.  Pulmonary:     Effort: Pulmonary effort is normal.     Breath sounds: Normal breath sounds.  Abdominal:     Palpations: There is no pulsatile mass.  Musculoskeletal:     Right lower leg: No edema.     Left lower leg: No edema.     Comments: Well-healed scar midline lower lumbar spine.  No midline bony tenderness, but reproducible pain with palpation over right lower paraspinals toward the right flank.  Skin intact without rash, erythema, ecchymosis. No change in pain with seated straight leg raise and no radiation of pain into the legs with seated straight leg raise.   Skin:    General: Skin is warm and dry.  Neurological:     Mental Status: She is alert  and oriented to person, place, and time.  Psychiatric:        Mood and Affect: Mood normal.        Behavior: Behavior normal.     Assessment & Plan:  Alizey Noren is a 63 y.o. female . Acute right-sided low back pain without sciatica - Plan: cyclobenzaprine  (FLEXERIL ) 10 MG tablet  Spinal stenosis of lumbar region, unspecified whether neurogenic claudication present  Cutaneous abscess of abdominal wall  Type 2 diabetes mellitus with hyperglycemia, without long-term current use of insulin  (HCC)    Meds ordered this encounter  Medications   cyclobenzaprine  (FLEXERIL ) 10 MG tablet    Sig: Take 0.5-1 tablets (5-10 mg total) by mouth 3 (three) times daily as needed for muscle spasms (start qhs prn due to sedation).    Dispense:  30 tablet    Refill:  0   Patient Instructions  Keep follow up as planned with Dr. Dasie for the abscess, suspected fistula. If fevers, or worsening symptoms be seen.  Elevated blood sugars may be in part due to your infection. Keep follow up as planned with Dr. Sam as planned in 3 weeks, but if readings go up again - call her office to discuss med changes if needed sooner.   Based  on your history of spinal stenosis and the location of your pain it does appear to be coming from the back at this time.  Okay to continue Flexeril .  I will get back to you regarding other possible imaging or follow-up with a spine specialist, as if this appears to be from the back, an epidural spinal injection may be beneficial.  However we may need to obtain more up-to-date imaging first.  Again I will let you know.  Return to the clinic or go to the nearest emergency room if any of your symptoms worsen or new symptoms occur.          Signed,   Reyes Pines, MD [facility], [facility] 04/29/24 5:15 PM

## 2024-04-29 NOTE — Patient Instructions (Addendum)
 Keep follow up as planned with Dr. Dasie for the abscess, suspected fistula. If fevers, or worsening symptoms be seen.  Elevated blood sugars may be in part due to your infection. Keep follow up as planned with Dr. Sam as planned in 3 weeks, but if readings go up again - call her office to discuss med changes if needed sooner.   Based on your history of spinal stenosis and the location of your pain it does appear to be coming from the back at this time.  Okay to continue Flexeril .  I will get back to you regarding other possible imaging or follow-up with a spine specialist, as if this appears to be from the back, an epidural spinal injection may be beneficial.  However we may need to obtain more up-to-date imaging first.  Again I will let you know.  Return to the clinic or go to the nearest emergency room if any of your symptoms worsen or new symptoms occur.

## 2024-05-02 ENCOUNTER — Encounter: Payer: Self-pay | Admitting: Family Medicine

## 2024-05-03 ENCOUNTER — Encounter: Payer: Self-pay | Admitting: Hematology & Oncology

## 2024-05-03 NOTE — Addendum Note (Signed)
 Addended by: Madylyn Insco R on: 05/03/2024 04:43 PM   Modules accepted: Orders

## 2024-05-04 ENCOUNTER — Other Ambulatory Visit: Payer: Self-pay | Admitting: Hematology & Oncology

## 2024-05-04 DIAGNOSIS — H8112 Benign paroxysmal vertigo, left ear: Secondary | ICD-10-CM

## 2024-05-06 ENCOUNTER — Other Ambulatory Visit: Payer: Self-pay | Admitting: *Deleted

## 2024-05-06 ENCOUNTER — Encounter: Payer: Self-pay | Admitting: Hematology & Oncology

## 2024-05-06 DIAGNOSIS — E061 Subacute thyroiditis: Secondary | ICD-10-CM

## 2024-05-06 DIAGNOSIS — R978 Other abnormal tumor markers: Secondary | ICD-10-CM

## 2024-05-06 DIAGNOSIS — C181 Malignant neoplasm of appendix: Secondary | ICD-10-CM

## 2024-05-06 DIAGNOSIS — E0822 Diabetes mellitus due to underlying condition with diabetic chronic kidney disease: Secondary | ICD-10-CM

## 2024-05-06 DIAGNOSIS — C178 Malignant neoplasm of overlapping sites of small intestine: Secondary | ICD-10-CM

## 2024-05-06 DIAGNOSIS — E1165 Type 2 diabetes mellitus with hyperglycemia: Secondary | ICD-10-CM

## 2024-05-06 DIAGNOSIS — C786 Secondary malignant neoplasm of retroperitoneum and peritoneum: Secondary | ICD-10-CM

## 2024-05-06 DIAGNOSIS — K2900 Acute gastritis without bleeding: Secondary | ICD-10-CM

## 2024-05-06 DIAGNOSIS — R110 Nausea: Secondary | ICD-10-CM

## 2024-05-06 DIAGNOSIS — C7A1 Malignant poorly differentiated neuroendocrine tumors: Secondary | ICD-10-CM

## 2024-05-06 DIAGNOSIS — K649 Unspecified hemorrhoids: Secondary | ICD-10-CM

## 2024-05-06 DIAGNOSIS — F064 Anxiety disorder due to known physiological condition: Secondary | ICD-10-CM

## 2024-05-06 DIAGNOSIS — R300 Dysuria: Secondary | ICD-10-CM

## 2024-05-06 DIAGNOSIS — I82412 Acute embolism and thrombosis of left femoral vein: Secondary | ICD-10-CM

## 2024-05-06 DIAGNOSIS — Z95828 Presence of other vascular implants and grafts: Secondary | ICD-10-CM

## 2024-05-06 DIAGNOSIS — R8271 Bacteriuria: Secondary | ICD-10-CM

## 2024-05-06 DIAGNOSIS — R799 Abnormal finding of blood chemistry, unspecified: Secondary | ICD-10-CM

## 2024-05-06 DIAGNOSIS — E032 Hypothyroidism due to medicaments and other exogenous substances: Secondary | ICD-10-CM

## 2024-05-06 DIAGNOSIS — K7682 Hepatic encephalopathy: Secondary | ICD-10-CM

## 2024-05-06 DIAGNOSIS — C257 Malignant neoplasm of other parts of pancreas: Secondary | ICD-10-CM

## 2024-05-06 DIAGNOSIS — I2699 Other pulmonary embolism without acute cor pulmonale: Secondary | ICD-10-CM

## 2024-05-06 DIAGNOSIS — C574 Malignant neoplasm of uterine adnexa, unspecified: Secondary | ICD-10-CM

## 2024-05-06 DIAGNOSIS — K591 Functional diarrhea: Secondary | ICD-10-CM

## 2024-05-06 DIAGNOSIS — K769 Liver disease, unspecified: Secondary | ICD-10-CM

## 2024-05-06 DIAGNOSIS — D510 Vitamin B12 deficiency anemia due to intrinsic factor deficiency: Secondary | ICD-10-CM

## 2024-05-06 DIAGNOSIS — E860 Dehydration: Secondary | ICD-10-CM

## 2024-05-06 DIAGNOSIS — N3000 Acute cystitis without hematuria: Secondary | ICD-10-CM

## 2024-05-06 DIAGNOSIS — Z85038 Personal history of other malignant neoplasm of large intestine: Secondary | ICD-10-CM

## 2024-05-08 ENCOUNTER — Inpatient Hospital Stay: Attending: Hematology & Oncology

## 2024-05-08 ENCOUNTER — Encounter: Payer: Self-pay | Admitting: Hematology & Oncology

## 2024-05-08 ENCOUNTER — Inpatient Hospital Stay

## 2024-05-08 ENCOUNTER — Ambulatory Visit
Admission: RE | Admit: 2024-05-08 | Discharge: 2024-05-08 | Disposition: A | Discharge status: Home / Self Care | Source: Ambulatory Visit | Attending: Family Medicine | Admitting: Family Medicine

## 2024-05-08 ENCOUNTER — Inpatient Hospital Stay (HOSPITAL_BASED_OUTPATIENT_CLINIC_OR_DEPARTMENT_OTHER): Admitting: Hematology & Oncology

## 2024-05-08 VITALS — BP 117/61 | HR 70 | Temp 98.5°F | Resp 18 | Ht 66.0 in | Wt 245.1 lb

## 2024-05-08 DIAGNOSIS — I82412 Acute embolism and thrombosis of left femoral vein: Secondary | ICD-10-CM

## 2024-05-08 DIAGNOSIS — Z888 Allergy status to other drugs, medicaments and biological substances status: Secondary | ICD-10-CM | POA: Insufficient documentation

## 2024-05-08 DIAGNOSIS — D510 Vitamin B12 deficiency anemia due to intrinsic factor deficiency: Secondary | ICD-10-CM | POA: Insufficient documentation

## 2024-05-08 DIAGNOSIS — E061 Subacute thyroiditis: Secondary | ICD-10-CM

## 2024-05-08 DIAGNOSIS — R8271 Bacteriuria: Secondary | ICD-10-CM

## 2024-05-08 DIAGNOSIS — C574 Malignant neoplasm of uterine adnexa, unspecified: Secondary | ICD-10-CM

## 2024-05-08 DIAGNOSIS — K529 Noninfective gastroenteritis and colitis, unspecified: Secondary | ICD-10-CM | POA: Diagnosis not present

## 2024-05-08 DIAGNOSIS — R109 Unspecified abdominal pain: Secondary | ICD-10-CM | POA: Diagnosis not present

## 2024-05-08 DIAGNOSIS — R300 Dysuria: Secondary | ICD-10-CM | POA: Diagnosis not present

## 2024-05-08 DIAGNOSIS — Z95828 Presence of other vascular implants and grafts: Secondary | ICD-10-CM

## 2024-05-08 DIAGNOSIS — C7A1 Malignant poorly differentiated neuroendocrine tumors: Secondary | ICD-10-CM

## 2024-05-08 DIAGNOSIS — Z79899 Other long term (current) drug therapy: Secondary | ICD-10-CM | POA: Diagnosis not present

## 2024-05-08 DIAGNOSIS — Z7901 Long term (current) use of anticoagulants: Secondary | ICD-10-CM | POA: Diagnosis not present

## 2024-05-08 DIAGNOSIS — C181 Malignant neoplasm of appendix: Secondary | ICD-10-CM | POA: Diagnosis present

## 2024-05-08 DIAGNOSIS — E032 Hypothyroidism due to medicaments and other exogenous substances: Secondary | ICD-10-CM

## 2024-05-08 DIAGNOSIS — C772 Secondary and unspecified malignant neoplasm of intra-abdominal lymph nodes: Secondary | ICD-10-CM | POA: Diagnosis not present

## 2024-05-08 DIAGNOSIS — R5383 Other fatigue: Secondary | ICD-10-CM | POA: Diagnosis not present

## 2024-05-08 DIAGNOSIS — Z880 Allergy status to penicillin: Secondary | ICD-10-CM | POA: Diagnosis not present

## 2024-05-08 DIAGNOSIS — L02211 Cutaneous abscess of abdominal wall: Secondary | ICD-10-CM | POA: Insufficient documentation

## 2024-05-08 DIAGNOSIS — Z881 Allergy status to other antibiotic agents status: Secondary | ICD-10-CM | POA: Diagnosis not present

## 2024-05-08 DIAGNOSIS — M549 Dorsalgia, unspecified: Secondary | ICD-10-CM | POA: Insufficient documentation

## 2024-05-08 DIAGNOSIS — C786 Secondary malignant neoplasm of retroperitoneum and peritoneum: Secondary | ICD-10-CM

## 2024-05-08 DIAGNOSIS — E1165 Type 2 diabetes mellitus with hyperglycemia: Secondary | ICD-10-CM

## 2024-05-08 DIAGNOSIS — K2900 Acute gastritis without bleeding: Secondary | ICD-10-CM

## 2024-05-08 DIAGNOSIS — I2699 Other pulmonary embolism without acute cor pulmonale: Secondary | ICD-10-CM | POA: Diagnosis not present

## 2024-05-08 DIAGNOSIS — R110 Nausea: Secondary | ICD-10-CM

## 2024-05-08 DIAGNOSIS — Z85038 Personal history of other malignant neoplasm of large intestine: Secondary | ICD-10-CM

## 2024-05-08 DIAGNOSIS — R978 Other abnormal tumor markers: Secondary | ICD-10-CM | POA: Diagnosis not present

## 2024-05-08 DIAGNOSIS — M48061 Spinal stenosis, lumbar region without neurogenic claudication: Secondary | ICD-10-CM

## 2024-05-08 DIAGNOSIS — E860 Dehydration: Secondary | ICD-10-CM

## 2024-05-08 DIAGNOSIS — M545 Low back pain, unspecified: Secondary | ICD-10-CM

## 2024-05-08 DIAGNOSIS — C257 Malignant neoplasm of other parts of pancreas: Secondary | ICD-10-CM

## 2024-05-08 DIAGNOSIS — C178 Malignant neoplasm of overlapping sites of small intestine: Secondary | ICD-10-CM

## 2024-05-08 DIAGNOSIS — K769 Liver disease, unspecified: Secondary | ICD-10-CM

## 2024-05-08 DIAGNOSIS — F064 Anxiety disorder due to known physiological condition: Secondary | ICD-10-CM

## 2024-05-08 DIAGNOSIS — K649 Unspecified hemorrhoids: Secondary | ICD-10-CM

## 2024-05-08 DIAGNOSIS — Z981 Arthrodesis status: Secondary | ICD-10-CM | POA: Diagnosis not present

## 2024-05-08 DIAGNOSIS — N3000 Acute cystitis without hematuria: Secondary | ICD-10-CM

## 2024-05-08 DIAGNOSIS — K591 Functional diarrhea: Secondary | ICD-10-CM

## 2024-05-08 DIAGNOSIS — K7682 Hepatic encephalopathy: Secondary | ICD-10-CM

## 2024-05-08 DIAGNOSIS — E0822 Diabetes mellitus due to underlying condition with diabetic chronic kidney disease: Secondary | ICD-10-CM

## 2024-05-08 DIAGNOSIS — R799 Abnormal finding of blood chemistry, unspecified: Secondary | ICD-10-CM

## 2024-05-08 LAB — CBC WITH DIFFERENTIAL (CANCER CENTER ONLY)
Abs Immature Granulocytes: 0.01 K/uL (ref 0.00–0.07)
Basophils Absolute: 0.1 K/uL (ref 0.0–0.1)
Basophils Relative: 1 %
Eosinophils Absolute: 0.2 K/uL (ref 0.0–0.5)
Eosinophils Relative: 2 %
HCT: 39.1 % (ref 36.0–46.0)
Hemoglobin: 12.4 g/dL (ref 12.0–15.0)
Immature Granulocytes: 0 %
Lymphocytes Relative: 34 %
Lymphs Abs: 3.5 K/uL (ref 0.7–4.0)
MCH: 30.4 pg (ref 26.0–34.0)
MCHC: 31.7 g/dL (ref 30.0–36.0)
MCV: 95.8 fL (ref 80.0–100.0)
Monocytes Absolute: 0.8 K/uL (ref 0.1–1.0)
Monocytes Relative: 8 %
Neutro Abs: 5.8 K/uL (ref 1.7–7.7)
Neutrophils Relative %: 55 %
Platelet Count: 49 K/uL — ABNORMAL LOW (ref 150–400)
RBC: 4.08 MIL/uL (ref 3.87–5.11)
RDW: 13.7 % (ref 11.5–15.5)
WBC Count: 10.4 K/uL (ref 4.0–10.5)
nRBC: 0 % (ref 0.0–0.2)

## 2024-05-08 LAB — CMP (CANCER CENTER ONLY)
ALT: 30 U/L (ref 0–44)
AST: 44 U/L — ABNORMAL HIGH (ref 15–41)
Albumin: 4 g/dL (ref 3.5–5.0)
Alkaline Phosphatase: 191 U/L — ABNORMAL HIGH (ref 38–126)
Anion gap: 8 (ref 5–15)
BUN: 21 mg/dL (ref 8–23)
CO2: 29 mmol/L (ref 22–32)
Calcium: 9.5 mg/dL (ref 8.9–10.3)
Chloride: 101 mmol/L (ref 98–111)
Creatinine: 1.08 mg/dL — ABNORMAL HIGH (ref 0.44–1.00)
GFR, Estimated: 58 mL/min — ABNORMAL LOW (ref >60)
Glucose, Bld: 353 mg/dL — ABNORMAL HIGH (ref 70–99)
Potassium: 3.9 mmol/L (ref 3.5–5.1)
Sodium: 138 mmol/L (ref 135–145)
Total Bilirubin: 0.4 mg/dL (ref 0.0–1.2)
Total Protein: 7.1 g/dL (ref 6.5–8.1)

## 2024-05-08 LAB — RETICULOCYTES
Immature Retic Fract: 10.4 % (ref 2.3–15.9)
RBC.: 4.02 MIL/uL (ref 3.87–5.11)
Retic Count, Absolute: 69.5 K/uL (ref 19.0–186.0)
Retic Ct Pct: 1.7 % (ref 0.4–3.1)

## 2024-05-08 LAB — FERRITIN: Ferritin: 52 ng/mL (ref 11–307)

## 2024-05-08 LAB — CEA (ACCESS): CEA (CHCC): 3.32 ng/mL (ref 0.00–5.00)

## 2024-05-08 LAB — TSH: TSH: 1.44 u[IU]/mL (ref 0.350–4.500)

## 2024-05-08 LAB — VITAMIN B12: Vitamin B-12: 282 pg/mL (ref 180–914)

## 2024-05-08 MED ORDER — LANREOTIDE ACETATE 120 MG/0.5ML ~~LOC~~ SOLN
120.0000 mg | Freq: Once | SUBCUTANEOUS | Status: AC
  Administered 2024-05-08: 120 mg via SUBCUTANEOUS
  Filled 2024-05-08: qty 120

## 2024-05-08 MED ORDER — HYDROMORPHONE HCL 4 MG PO TABS
4.0000 mg | ORAL_TABLET | Freq: Four times a day (QID) | ORAL | 0 refills | Status: DC | PRN
Start: 2024-05-08 — End: 2024-08-23

## 2024-05-08 NOTE — Patient Instructions (Signed)

## 2024-05-08 NOTE — Addendum Note (Signed)
 Addended by: TIMMY MAUDE SAUNDERS on: 05/08/2024 05:56 PM   Modules accepted: Orders

## 2024-05-08 NOTE — Patient Instructions (Signed)
 Lanreotide Injection What is this medication? LANREOTIDE (lan REE oh tide) treats high levels of growth hormone (acromegaly). It is used when other therapies have not worked well enough or cannot be tolerated. It works by reducing the amount of growth hormone your body makes. This reduces symptoms and the risk of health problems caused by too much growth hormone, such as diabetes and heart disease. It may also be used to treat neuroendocrine tumors, a cancer of the cells that release hormones and other substances in your body. It works by slowing down the release of these substances from the cells. This slows tumor growth. It also decreases the symptoms of carcinoid syndrome, such as flushing or diarrhea. This medicine may be used for other purposes; ask your health care provider or pharmacist if you have questions. COMMON BRAND NAME(S): Somatuline [place] What should I tell my care team before I take this medication? They need to know if you have any of these conditions: Diabetes Gallbladder disease Heart disease Kidney disease Liver disease Pancreatic disease Thyroid disease An unusual or allergic reaction to lanreotide, other medications, foods, dyes, or preservatives Pregnant or trying to get pregnant Breastfeeding How should I use this medication? This medication is injected under the skin. It is given by your care team in a hospital or clinic setting. Talk to your care team about the use of this medication in children. Special care may be needed. Overdosage: If you think you have taken too much of this medicine contact a poison control center or emergency room at once. NOTE: This medicine is only for you. Do not share this medicine with others. What if I miss a dose? Keep appointments for follow-up doses. It is important not to miss your dose. Call your care team if you are unable to keep an appointment. What may interact with this medication? Bromocriptine Cyclosporine Certain  medications for blood pressure, heart disease, irregular heartbeat Certain medications for diabetes Quinidine Terfenadine This list may not describe all possible interactions. Give your health care provider a list of all the medicines, herbs, non-prescription drugs, or dietary supplements you use. Also tell them if you smoke, drink alcohol, or use illegal drugs. Some items may interact with your medicine. What should I watch for while using this medication? Visit your care team for regular checks on your progress. Tell your care team if your symptoms do not start to get better or if they get worse. Your condition will be monitored carefully while you are receiving this medication. You may need blood work while you are taking this medication. This medication may increase blood sugar. The risk may be higher in patients who already have diabetes. Ask your care team what you can do to lower your risk of diabetes while taking this medication. Talk to your care team if you wish to become pregnant or think you may be pregnant. This medication can cause serious birth defects. Do not breast-feed while taking this medication and for 6 months after stopping therapy. This medication may cause infertility. Talk to your care team if you are concerned about your fertility. What side effects may I notice from receiving this medication? Side effects that you should report to your care team as soon as possible: Allergic reactions--skin rash, itching, hives, swelling of the face, lips, tongue, or throat Gallbladder problems--severe stomach pain, nausea, vomiting, fever High blood sugar (hyperglycemia)--increased thirst or amount of urine, unusual weakness or fatigue, blurry vision Increase in blood pressure Low blood sugar (hypoglycemia)--pale, blue or purple  skin or lips, sweating, fussiness, rapid heartbeat, poor feeding, low body temperature Low thyroid levels (hypothyroidism)--unusual weakness or fatigue,  increased sensitivity to cold, constipation, hair loss, dry skin, weight gain, feelings of depression Oily or light-colored stools, diarrhea, bloating, weight loss Slow heartbeat--dizziness, feeling faint or lightheaded, confusion, trouble breathing, unusual weakness or fatigue Side effects that usually do not require medical attention (report these to your care team if they continue or are bothersome): Diarrhea Dizziness Headache Muscle spasms Nausea Pain, redness, or irritation at injection site Stomach pain This list may not describe all possible side effects. Call your doctor for medical advice about side effects. You may report side effects to FDA at 1-800-FDA-1088. Where should I keep my medication? This medication is given in a hospital or clinic. It will not be stored at home. NOTE: This sheet is a summary. It may not cover all possible information. If you have questions about this medicine, talk to your doctor, pharmacist, or health care provider.  2024 Elsevier/Gold Standard (2023-09-22 00:00:00)

## 2024-05-08 NOTE — Progress Notes (Signed)
 Hematology and Oncology Follow Up Visit  Wendy Barber [id number] 11/25/1960 63 y.o. 05/08/2024   Principle Diagnosis:  History of metastatic appendiceal cancer  -- recurrent  Superior mesenteric vein thrombus Thromboembolism of the RIGHT leg Pernicious anemia Acute pulmonary embolism-segmental right pulmonary artery Carcinoid/neuroendocrine carcinoma  Current Therapy:   HIPEC - Surgery done in [place] in 11/2020 Arixtra  10 mg subcu daily --start on 2/16 2024 Vitamin B12 5000 mcg PO daily  Somatuline 120 mg IM monthly-start on 07/12/2023     Interim History:  [name] is in for follow-up.  Unfortunately, she been having problems with back pain.  She actually was seen in the emergency room.  She did have a CT scan.  Believe that she may have a fistula or possibly abscess.  Surgery came to see her.  They opened up this abscess that was right around the umbilicus.  Unfortunately, no cultures were taken.  She was put on I think Cipro  and Flagyl .  She had a CT of the back this morning.  This is of the lumbar spine.  Report is not back yet.  She has had spinal fusion in the past.  Her Chromogranin A level has come down quite nicely.  Her last value was 33.  She still has diarrhea.  This is really about the same.  She does have large ecchymoses on the back of her right upper arm.  This is from where she injected herself with Arixtra .  I suspect this probably will resolve over weeks.  She has had no obvious bleeding.  She has had no problems with the urine.  She has had no leg swelling.  Currently, I would have to say that her performance status about ECOG 2.     Wt Readings from Last 3 Encounters:  05/08/24 245 lb 1.6 oz (111.2 kg)  04/29/24 245 lb 12.8 oz (111.5 kg)  04/19/24 236 lb (107 kg)    Medications:  Current Outpatient Medications:    acetaminophen  (TYLENOL ) 500 MG tablet, Take 1,000 mg by mouth every 6 (six) hours as needed for moderate pain (pain score 4-6).,  Disp: , Rfl:    amLODipine  (NORVASC ) 5 MG tablet, TAKE 1 TABLET(5 MG) BY MOUTH DAILY, Disp: 90 tablet, Rfl: 1   busPIRone  (BUSPAR ) 15 MG tablet, TAKE 1 TABLET(15 MG) BY MOUTH THREE TIMES DAILY, Disp: 270 tablet, Rfl: 0   cabergoline  (DOSTINEX ) 0.5 MG tablet, Take 0.5 tablets (0.25 mg total) by mouth 2 (two) times a week., Disp: 12 tablet, Rfl: 3   citalopram  (CELEXA ) 40 MG tablet, TAKE 1 TABLET(40 MG) BY MOUTH DAILY, Disp: 90 tablet, Rfl: 0   cyclobenzaprine  (FLEXERIL ) 10 MG tablet, Take 0.5-1 tablets (5-10 mg total) by mouth 3 (three) times daily as needed for muscle spasms (start qhs prn due to sedation)., Disp: 30 tablet, Rfl: 0   dicyclomine  (BENTYL ) 20 MG tablet, TAKE 1 TABLET(20 MG) BY MOUTH THREE TIMES DAILY BEFORE MEALS, Disp: 90 tablet, Rfl: 4   diphenoxylate -atropine  (LOMOTIL ) 2.5-0.025 MG tablet, TAKE 2 TABLETS BY MOUTH FOUR TIMES DAILY AS NEEDED FOR LOOSE STOOLS OR DIARRHEA, Disp: 240 tablet, Rfl: 0   eszopiclone  (LUNESTA ) 2 MG TABS tablet, TAKE 1 TABLET(2 MG) BY MOUTH AT BEDTIME AS NEEDED FOR SLEEP, Disp: 30 tablet, Rfl: 1   famotidine  (PEPCID ) 20 MG tablet, TAKE 1 TABLET(20 MG) BY MOUTH TWICE DAILY, Disp: 60 tablet, Rfl: 0   fondaparinux  (ARIXTRA ) 10 MG/0.8ML SOLN injection, Inject 0.8 mLs (10 mg total) into the skin daily., Disp: 72  mL, Rfl: 3   glipiZIDE  (GLUCOTROL ) 5 MG tablet, Take 1 tablet (5 mg total) by mouth 2 (two) times daily before a meal., Disp: 180 tablet, Rfl: 3   hydrocortisone  (ANUSOL -HC) 2.5 % rectal cream, PLACE RECTALLY 4 TIMES DAILY AS NEEDED FOR HEMORRHOIDS OR ANAL ITCHING, Disp: 30 g, Rfl: 1   hyoscyamine  (ANASPAZ ) 0.125 MG TBDP disintergrating tablet, Place 1 tablet (0.125 mg total) under the tongue every 6 (six) hours as needed., Disp: 30 tablet, Rfl: 0   lidocaine -hydrocortisone  (ANAMANTLE) 3-1 % KIT, Place 1 Application rectally 2 (two) times daily., Disp: 1 kit, Rfl: 0   loperamide  (IMODIUM ) 2 MG capsule, Take 2 capsules (4 mg total) by mouth 4 (four) times  daily as needed for diarrhea or loose stools., Disp: , Rfl:    meclizine  (ANTIVERT ) 25 MG tablet, TAKE 1 TABLET(25 MG) BY MOUTH THREE TIMES DAILY AS NEEDED FOR DIZZINESS, Disp: 60 tablet, Rfl: 0   mirtazapine  (REMERON ) 15 MG tablet, Take 1 tablet (15 mg total) by mouth at bedtime., Disp: 30 tablet, Rfl: 3   ondansetron  (ZOFRAN ) 8 MG tablet, TAKE 1 TABLET(8 MG) BY MOUTH EVERY 8 HOURS AS NEEDED FOR NAUSEA OR VOMITING, Disp: 30 tablet, Rfl: 2   orphenadrine  (NORFLEX ) 100 MG tablet, TAKE 1 TABLET(100 MG) BY MOUTH AT BEDTIME AS NEEDED FOR MUSCLE SPASMS, Disp: 30 tablet, Rfl: 2   pantoprazole  (PROTONIX ) 40 MG tablet, TAKE 1 TABLET BY MOUTH EVERY DAY BEFORE BREAKFAST, Disp: 90 tablet, Rfl: 1   pramoxine-hydrocortisone  (PROCTOCREAM-HC) 1-1 % rectal cream, APPLY RECTALLY TO THE AFFECTED AREA TWICE DAILY (Patient taking differently: Place 1 Application rectally daily as needed for hemorrhoids or anal itching.), Disp: 30 g, Rfl: 3   promethazine  (PHENERGAN ) 12.5 MG tablet, TAKE 1 TABLET(12.5 MG) BY MOUTH EVERY 6 HOURS AS NEEDED FOR NAUSEA OR VOMITING, Disp: 90 tablet, Rfl: 3   propranolol  (INDERAL ) 20 MG tablet, TAKE 1 TABLET(20 MG) BY MOUTH TWICE DAILY, Disp: 180 tablet, Rfl: 1   rOPINIRole  (REQUIP ) 1 MG tablet, Take 1 tablet (1 mg total) by mouth at bedtime., Disp: 30 tablet, Rfl: 3   tamsulosin  (FLOMAX ) 0.4 MG CAPS capsule, Take 1 capsule (0.4 mg total) by mouth at bedtime., Disp: 30 capsule, Rfl: 0   terconazole  (TERAZOL 7 ) 0.4 % vaginal cream, Apply topically twice daily for up to 7 days, Disp: 45 g, Rfl: 2   HYDROmorphone  (DILAUDID ) 4 MG tablet, Take 1 tablet (4 mg total) by mouth every 6 (six) hours as needed for severe pain (pain score 7-10)., Disp: 60 tablet, Rfl: 0   metroNIDAZOLE  (FLAGYL ) 500 MG tablet, Take 1 tablet (500 mg total) by mouth 2 (two) times daily., Disp: 14 tablet, Rfl: 0 No current facility-administered medications for this visit.  Facility-Administered Medications Ordered in Other  Visits:    lanreotide acetate  (SOMATULINE DEPOT ) injection 120 mg, 120 mg, Subcutaneous, Once, Dmiya Malphrus, Maude SAUNDERS, MD  Allergies:  Allergies  Allergen Reactions   Ativan [Lorazepam] Swelling and Other (See Comments)    Face & Throat Swelling  Note: tolerates midazolam  fine   Corticosteroids Other (See Comments)    Psychotic behavior    Penicillins Shortness Of Breath and Other (See Comments)    Irregular and rapid Heart Rate, too    Alprazolam Hives and Other (See Comments)    Hard to arouse, unresponsiveness also   Erythromycin Nausea And Vomiting        Gabapentin  Other (See Comments)    Made the patient feel depressed   Prednisolone Anxiety  Prednisone Anxiety and Other (See Comments)    Anxiety & Nervous Breakdown   Savella  [Milnacipran] Other (See Comments)    Reaction not noted    Past Medical History, Surgical history, Social history, and Family History were reviewed and updated.  Review of Systems: Review of Systems  Constitutional:  Positive for fatigue and unexpected weight change.  HENT:  Negative.    Eyes: Negative.   Respiratory: Negative.    Cardiovascular: Negative.   Gastrointestinal:  Positive for abdominal pain and diarrhea.  Endocrine: Negative.   Genitourinary:  Positive for dysuria.   Musculoskeletal:  Positive for back pain.  Skin: Negative.   Neurological: Negative.   Hematological: Negative.   Psychiatric/Behavioral:  Negative for depression.     Physical Exam: Vitals:   05/08/24 1312  BP: 117/61  Pulse: 70  Resp: 18  Temp: 98.5 F (36.9 C)  SpO2: 95%   Wt Readings from Last 3 Encounters:  05/08/24 245 lb 1.6 oz (111.2 kg)  04/29/24 245 lb 12.8 oz (111.5 kg)  04/19/24 236 lb (107 kg)    Physical Exam Vitals reviewed.  HENT:     Head: Normocephalic and atraumatic.  Eyes:     Pupils: Pupils are equal, round, and reactive to light.  Cardiovascular:     Rate and Rhythm: Normal rate and regular rhythm.     Heart sounds:  Normal heart sounds.  Pulmonary:     Effort: Pulmonary effort is normal.     Breath sounds: Normal breath sounds.  Abdominal:     General: Bowel sounds are normal.     Palpations: Abdomen is soft.  Musculoskeletal:        General: No tenderness or deformity. Normal range of motion.     Cervical back: Normal range of motion.  Lymphadenopathy:     Cervical: No cervical adenopathy.  Skin:    General: Skin is warm and dry.     Findings: No erythema or rash.  Neurological:     Mental Status: She is alert and oriented to person, place, and time.  Psychiatric:        Behavior: Behavior normal.        Thought Content: Thought content normal.        Judgment: Judgment normal.    Lab Results  Component Value Date   WBC 10.4 05/08/2024   HGB 12.4 05/08/2024   HCT 39.1 05/08/2024   MCV 95.8 05/08/2024   PLT PENDING 05/08/2024     Chemistry      Component Value Date/Time   NA 138 05/08/2024 1210   NA 137 04/24/2023 1437   K 3.9 05/08/2024 1210   CL 101 05/08/2024 1210   CO2 29 05/08/2024 1210   BUN 21 05/08/2024 1210   BUN 18 04/24/2023 1437   CREATININE 1.08 (H) 05/08/2024 1210   CREATININE 0.96 01/19/2024 1415      Component Value Date/Time   CALCIUM  9.5 05/08/2024 1210   ALKPHOS 191 (H) 05/08/2024 1210   AST 44 (H) 05/08/2024 1210   ALT 30 05/08/2024 1210   BILITOT 0.4 05/08/2024 1210      Impression and Plan: Ms. Osias is a very charming 63 year-old white female. She has recurrent appendiceal cancer.  She underwent a HIPEC procedure in [place] a couple years ago. Unfortunately she had had chronic diarrhea since including C. Diff which has been successfully treatment.   I feel bad about the back issue.  I feel bad about this abscess with the abdomen.  Again I took some cultures today.  I know she has been on antibiotics already but yet I thought it be worthwhile checking cultures to see if there is anything that is positive.  Hopefully, the CT scan of the back  does not show anything that is significant or related to any malignancy.  For right now, we will plan on getting her back in another month.  I think she goes up to [place] at some point.  I know she has had a lot of fatigue.  We checked her iron studies just a month ago.  Her ferritin was 97 with iron saturation 29%.  Her last vitamin B12 level was 285 back in May.  Everything that we are doing is all about quality of life for her.  I just wish that we get her feeling better so she could enjoy her family a lot more enjoyed traveling more.   Maude JONELLE Crease, MD 7/16/20252:14 PM

## 2024-05-09 ENCOUNTER — Ambulatory Visit: Payer: Self-pay | Admitting: Hematology & Oncology

## 2024-05-09 ENCOUNTER — Encounter: Payer: Self-pay | Admitting: *Deleted

## 2024-05-09 LAB — IRON AND IRON BINDING CAPACITY (CC-WL,HP ONLY)
Iron: 94 ug/dL (ref 28–170)
Saturation Ratios: 24 % (ref 10.4–31.8)
TIBC: 399 ug/dL (ref 250–450)
UIBC: 305 ug/dL

## 2024-05-09 LAB — CA 125: Cancer Antigen (CA) 125: 6.5 U/mL (ref 0.0–38.1)

## 2024-05-09 LAB — CANCER ANTIGEN 19-9: CA 19-9: 26 U/mL (ref 0–35)

## 2024-05-09 LAB — CHROMOGRANIN A: Chromogranin A (ng/mL): 36.9 ng/mL (ref 0.0–101.8)

## 2024-05-10 ENCOUNTER — Inpatient Hospital Stay

## 2024-05-10 ENCOUNTER — Ambulatory Visit: Payer: Self-pay | Admitting: Family Medicine

## 2024-05-10 VITALS — BP 145/83 | HR 61 | Temp 97.9°F | Resp 20

## 2024-05-10 DIAGNOSIS — M545 Low back pain, unspecified: Secondary | ICD-10-CM

## 2024-05-10 DIAGNOSIS — C181 Malignant neoplasm of appendix: Secondary | ICD-10-CM | POA: Diagnosis not present

## 2024-05-10 DIAGNOSIS — M48061 Spinal stenosis, lumbar region without neurogenic claudication: Secondary | ICD-10-CM

## 2024-05-10 LAB — CBC (CANCER CENTER ONLY)
HCT: 37.9 % (ref 36.0–46.0)
Hemoglobin: 12 g/dL (ref 12.0–15.0)
MCH: 30.3 pg (ref 26.0–34.0)
MCHC: 31.7 g/dL (ref 30.0–36.0)
MCV: 95.7 fL (ref 80.0–100.0)
Platelet Count: 245 K/uL (ref 150–400)
RBC: 3.96 MIL/uL (ref 3.87–5.11)
RDW: 13.6 % (ref 11.5–15.5)
WBC Count: 9.3 K/uL (ref 4.0–10.5)
nRBC: 0 % (ref 0.0–0.2)

## 2024-05-10 LAB — DIFFERENTIAL
Abs Immature Granulocytes: 0.03 K/uL (ref 0.00–0.07)
Basophils Absolute: 0.1 K/uL (ref 0.0–0.1)
Basophils Relative: 1 %
Eosinophils Absolute: 0.3 K/uL (ref 0.0–0.5)
Eosinophils Relative: 3 %
Immature Granulocytes: 0 %
Lymphocytes Relative: 31 %
Lymphs Abs: 2.8 K/uL (ref 0.7–4.0)
Monocytes Absolute: 0.7 K/uL (ref 0.1–1.0)
Monocytes Relative: 8 %
Neutro Abs: 5.3 K/uL (ref 1.7–7.7)
Neutrophils Relative %: 57 %

## 2024-05-10 LAB — SAVE SMEAR(SSMR), FOR PROVIDER SLIDE REVIEW

## 2024-05-10 MED ORDER — SODIUM CHLORIDE 0.9% FLUSH: 10.0000 mL | INTRAVENOUS | Status: DC | PRN

## 2024-05-10 MED ORDER — HEPARIN SOD (PORK) LOCK FLUSH 100 UNIT/ML IV SOLN: 500.0000 [IU] | Freq: Once | INTRAVENOUS | Status: DC

## 2024-05-13 LAB — AEROBIC/ANAEROBIC CULTURE W GRAM STAIN (SURGICAL/DEEP WOUND): Gram Stain: NONE SEEN

## 2024-05-14 ENCOUNTER — Encounter: Payer: Self-pay | Admitting: Behavioral Health

## 2024-05-14 ENCOUNTER — Other Ambulatory Visit (HOSPITAL_COMMUNITY): Payer: Self-pay

## 2024-05-14 ENCOUNTER — Telehealth: Admitting: Behavioral Health

## 2024-05-14 DIAGNOSIS — F331 Major depressive disorder, recurrent, moderate: Secondary | ICD-10-CM | POA: Diagnosis not present

## 2024-05-14 DIAGNOSIS — F5105 Insomnia due to other mental disorder: Secondary | ICD-10-CM

## 2024-05-14 DIAGNOSIS — F411 Generalized anxiety disorder: Secondary | ICD-10-CM | POA: Diagnosis not present

## 2024-05-14 MED ORDER — PROPRANOLOL HCL 20 MG PO TABS: 20.0000 mg | ORAL_TABLET | Freq: Two times a day (BID) | ORAL | 1 refills | Status: DC

## 2024-05-14 MED ORDER — ESZOPICLONE 2 MG PO TABS: ORAL_TABLET | ORAL | 1 refills | Status: DC

## 2024-05-14 MED ORDER — BUSPIRONE HCL 15 MG PO TABS: 15.0000 mg | ORAL_TABLET | Freq: Three times a day (TID) | ORAL | 0 refills | Status: DC

## 2024-05-14 MED ORDER — CITALOPRAM HYDROBROMIDE 40 MG PO TABS: 40.0000 mg | ORAL_TABLET | Freq: Every day | ORAL | 1 refills | Status: DC

## 2024-05-14 MED ORDER — MIRTAZAPINE 15 MG PO TABS: 15.0000 mg | ORAL_TABLET | Freq: Every day | ORAL | 1 refills | Status: DC

## 2024-05-14 NOTE — Progress Notes (Signed)
 Wendy Barber [id number] 1961-03-14 63 y.o.  Virtual Visit via Video Note  I connected with pt @ on 05/14/24 at  3:00 PM EDT by a video enabled telemedicine application and verified that I am speaking with the correct person using two identifiers.   I discussed the limitations of evaluation and management by telemedicine and the availability of in person appointments. The patient expressed understanding and agreed to proceed.  I discussed the assessment and treatment plan with the patient. The patient was provided an opportunity to ask questions and all were answered. The patient agreed with the plan and demonstrated an understanding of the instructions.   The patient was advised to call back or seek an in-person evaluation if the symptoms worsen or if the condition fails to improve as anticipated.  I provided 30 minutes of non-face-to-face time during this encounter.  The patient was located at home.  The provider was located at [facility].   Redell DELENA Pizza, NP   Subjective:   Patient ID:  Wendy Barber is a 63 y.o. (DOB 1961/08/02) female.  Chief Complaint: No chief complaint on file.   HPI  63 year old female presents to this office via video visit  for follow up and medication management.   Still facing a multitude of health issues. Recent kidney stone and umbilical  fistula. She is discouraged right now  but  sleeping better. She understands that depression related to chronic disease is very difficult to treat. For now not wanting to make any medication adjustments.  Reports her anxiety today at 3/10 and depression at 4/10. She is sleeping 6-7 hours per night. Her family remains highly supportive. She denies any mania, no psychosis, No current SI or HI.     Review of Systems:  Review of Systems  Constitutional: Negative.   Allergic/Immunologic: Negative.   Neurological: Negative.   Psychiatric/Behavioral:  Positive for dysphoric mood. The patient is  nervous/anxious.     Medications: I have reviewed the patient's current medications.  Current Outpatient Medications  Medication Sig Dispense Refill   acetaminophen  (TYLENOL ) 500 MG tablet Take 1,000 mg by mouth every 6 (six) hours as needed for moderate pain (pain score 4-6).     amLODipine  (NORVASC ) 5 MG tablet TAKE 1 TABLET(5 MG) BY MOUTH DAILY 90 tablet 1   busPIRone  (BUSPAR ) 15 MG tablet TAKE 1 TABLET(15 MG) BY MOUTH THREE TIMES DAILY 270 tablet 0   cabergoline  (DOSTINEX ) 0.5 MG tablet Take 0.5 tablets (0.25 mg total) by mouth 2 (two) times a week. 12 tablet 3   citalopram  (CELEXA ) 40 MG tablet TAKE 1 TABLET(40 MG) BY MOUTH DAILY 90 tablet 0   cyclobenzaprine  (FLEXERIL ) 10 MG tablet Take 0.5-1 tablets (5-10 mg total) by mouth 3 (three) times daily as needed for muscle spasms (start qhs prn due to sedation). 30 tablet 0   dicyclomine  (BENTYL ) 20 MG tablet TAKE 1 TABLET(20 MG) BY MOUTH THREE TIMES DAILY BEFORE MEALS 90 tablet 4   diphenoxylate -atropine  (LOMOTIL ) 2.5-0.025 MG tablet TAKE 2 TABLETS BY MOUTH FOUR TIMES DAILY AS NEEDED FOR LOOSE STOOLS OR DIARRHEA 240 tablet 0   eszopiclone  (LUNESTA ) 2 MG TABS tablet TAKE 1 TABLET(2 MG) BY MOUTH AT BEDTIME AS NEEDED FOR SLEEP 30 tablet 1   famotidine  (PEPCID ) 20 MG tablet TAKE 1 TABLET(20 MG) BY MOUTH TWICE DAILY 60 tablet 0   fondaparinux  (ARIXTRA ) 10 MG/0.8ML SOLN injection Inject 0.8 mLs (10 mg total) into the skin daily. 72 mL 3   glipiZIDE  (GLUCOTROL ) 5  MG tablet Take 1 tablet (5 mg total) by mouth 2 (two) times daily before a meal. 180 tablet 3   hydrocortisone  (ANUSOL -HC) 2.5 % rectal cream PLACE RECTALLY 4 TIMES DAILY AS NEEDED FOR HEMORRHOIDS OR ANAL ITCHING 30 g 1   HYDROmorphone  (DILAUDID ) 4 MG tablet Take 1 tablet (4 mg total) by mouth every 6 (six) hours as needed for severe pain (pain score 7-10). 60 tablet 0   hyoscyamine  (ANASPAZ ) 0.125 MG TBDP disintergrating tablet Place 1 tablet (0.125 mg total) under the tongue every 6 (six)  hours as needed. 30 tablet 0   lidocaine -hydrocortisone  (ANAMANTLE) 3-1 % KIT Place 1 Application rectally 2 (two) times daily. 1 kit 0   loperamide  (IMODIUM ) 2 MG capsule Take 2 capsules (4 mg total) by mouth 4 (four) times daily as needed for diarrhea or loose stools.     meclizine  (ANTIVERT ) 25 MG tablet TAKE 1 TABLET(25 MG) BY MOUTH THREE TIMES DAILY AS NEEDED FOR DIZZINESS 60 tablet 0   metroNIDAZOLE  (FLAGYL ) 500 MG tablet Take 1 tablet (500 mg total) by mouth 2 (two) times daily. 14 tablet 0   mirtazapine  (REMERON ) 15 MG tablet Take 1 tablet (15 mg total) by mouth at bedtime. 30 tablet 3   ondansetron  (ZOFRAN ) 8 MG tablet TAKE 1 TABLET(8 MG) BY MOUTH EVERY 8 HOURS AS NEEDED FOR NAUSEA OR VOMITING 30 tablet 2   orphenadrine  (NORFLEX ) 100 MG tablet TAKE 1 TABLET(100 MG) BY MOUTH AT BEDTIME AS NEEDED FOR MUSCLE SPASMS 30 tablet 2   pantoprazole  (PROTONIX ) 40 MG tablet TAKE 1 TABLET BY MOUTH EVERY DAY BEFORE BREAKFAST 90 tablet 1   pramoxine-hydrocortisone  (PROCTOCREAM-HC) 1-1 % rectal cream APPLY RECTALLY TO THE AFFECTED AREA TWICE DAILY (Patient taking differently: Place 1 Application rectally daily as needed for hemorrhoids or anal itching.) 30 g 3   promethazine  (PHENERGAN ) 12.5 MG tablet TAKE 1 TABLET(12.5 MG) BY MOUTH EVERY 6 HOURS AS NEEDED FOR NAUSEA OR VOMITING 90 tablet 3   propranolol  (INDERAL ) 20 MG tablet TAKE 1 TABLET(20 MG) BY MOUTH TWICE DAILY 180 tablet 1   rOPINIRole  (REQUIP ) 1 MG tablet Take 1 tablet (1 mg total) by mouth at bedtime. 30 tablet 3   tamsulosin  (FLOMAX ) 0.4 MG CAPS capsule Take 1 capsule (0.4 mg total) by mouth at bedtime. 30 capsule 0   terconazole  (TERAZOL 7 ) 0.4 % vaginal cream Apply topically twice daily for up to 7 days 45 g 2   No current facility-administered medications for this visit.    Medication Side Effects: None  Allergies:  Allergies  Allergen Reactions   Ativan [Lorazepam] Swelling and Other (See Comments)    Face & Throat Swelling  Note:  tolerates midazolam  fine   Corticosteroids Other (See Comments)    Psychotic behavior    Penicillins Shortness Of Breath and Other (See Comments)    Irregular and rapid Heart Rate, too    Alprazolam Hives and Other (See Comments)    Hard to arouse, unresponsiveness also   Erythromycin Nausea And Vomiting        Gabapentin  Other (See Comments)    Made the patient feel depressed   Prednisolone Anxiety   Prednisone Anxiety and Other (See Comments)    Anxiety & Nervous Breakdown   Savella  [Milnacipran] Other (See Comments)    Reaction not noted    Past Medical History:  Diagnosis Date   Arthritis    Autoimmune hepatitis (HCC)    Back pain    Cancer (HCC)  pseudomyxoma peritonei, liver   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   Cardiomegaly 12/08/2022   without congestive failure, noted on ECHO   Cerebral meningioma (HCC)    Chronic diarrhea    Chronic fatigue syndrome    Diabetes mellitus without complication (HCC)    DVT of deep femoral vein, left (HCC) 03/19/2020   Fibromyalgia    GERD (gastroesophageal reflux disease)    Goals of care, counseling/discussion 03/19/2020   History of blood transfusion    Hypertension    Incomplete right bundle branch block (RBBB) 12/08/2023   Noted on EKG   Iron deficiency anemia due to chronic blood loss 05/14/2021   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   Migraines    Multiple thyroid  nodules    Pernicious anemia 05/14/2021   Pituitary adenoma (HCC)    PONV (postoperative nausea and vomiting)    not with recent surgeries   Presence of IVC filter 03/19/2020   Pulmonary embolism, bilateral (HCC) 03/19/2020   Rectal bleed 12/29/2023   Short bowel syndrome, unspecified    Vertigo     Family History  Problem Relation Age of Onset   Depression Mother    Drug abuse Sister    Suicidality Sister    Alcohol abuse Brother    Drug abuse Brother     Social History    Socioeconomic History   Marital status: Married    Spouse name: Not on file   Number of children: 3   Years of education: Not on file   Highest education level: Some college, no degree  Occupational History   Occupation: disability  Tobacco Use   Smoking status: Never   Smokeless tobacco: Never  Vaping Use   Vaping status: Never Used  Substance and Sexual Activity   Alcohol use: No   Drug use: Never   Sexual activity: Yes    Birth control/protection: None    Comment: hysterectomy  Other Topics Concern   Not on file  Social History Narrative   Right handed   One story home   Drinks occasional caffeine   Social Drivers of Health   Financial Resource Strain: Patient Declined (10/16/2023)   Overall Financial Resource Strain (CARDIA)    Difficulty of Paying Living Expenses: Patient declined  Food Insecurity: No Food Insecurity (10/16/2023)   Hunger Vital Sign    Worried About Running Out of Food in the Last Year: Never true    Ran Out of Food in the Last Year: Never true  Transportation Needs: No Transportation Needs (10/16/2023)   PRAPARE - Administrator, Civil Service (Medical): No    Lack of Transportation (Non-Medical): No  Physical Activity: Inactive (10/16/2023)   Exercise Vital Sign    Days of Exercise per Week: 0 days    Minutes of Exercise per Session: 20 min  Stress: Stress Concern Present (10/16/2023)   [employer] of Occupational Health - Occupational Stress Questionnaire    Feeling of Stress : Very much  Social Connections: Socially Integrated (10/16/2023)   Social Connection and Isolation Panel    Frequency of Communication with Friends and Family: More than three times a week    Frequency of Social Gatherings with Friends and Family: Once a week    Attends Religious Services: More than 4 times per year    Active Member of [employer] or Organizations: No    Attends Engineer, structural: More than 4 times per year    Marital  Status:  Married  Intimate Partner Violence: Not At Risk (03/09/2023)   Humiliation, Afraid, Rape, and Kick questionnaire    Fear of Current or Ex-Partner: No    Emotionally Abused: No    Physically Abused: No    Sexually Abused: No    Past Medical History, Surgical history, Social history, and Family history were reviewed and updated as appropriate.   Please see review of systems for further details on the patient's review from today.   Objective:   Physical Exam:  There were no vitals taken for this visit.  Physical Exam Neurological:     Mental Status: She is alert and oriented to person, place, and time.  Psychiatric:        Attention and Perception: Attention and perception normal.        Mood and Affect: Mood normal.        Speech: Speech normal.        Behavior: Behavior normal. Behavior is cooperative.        Cognition and Memory: Cognition and memory normal.        Judgment: Judgment normal.     Comments: Insight intact     Lab Review:     Component Value Date/Time   NA 138 05/08/2024 1210   NA 137 04/24/2023 1437   K 3.9 05/08/2024 1210   CL 101 05/08/2024 1210   CO2 29 05/08/2024 1210   GLUCOSE 353 (H) 05/08/2024 1210   BUN 21 05/08/2024 1210   BUN 18 04/24/2023 1437   CREATININE 1.08 (H) 05/08/2024 1210   CREATININE 0.96 01/19/2024 1415   CALCIUM  9.5 05/08/2024 1210   PROT 7.1 05/08/2024 1210   ALBUMIN 4.0 05/08/2024 1210   AST 44 (H) 05/08/2024 1210   ALT 30 05/08/2024 1210   ALKPHOS 191 (H) 05/08/2024 1210   BILITOT 0.4 05/08/2024 1210   GFRNONAA 58 (L) 05/08/2024 1210   GFRAA >60 07/27/2020 0922       Component Value Date/Time   WBC 9.3 05/10/2024 1210   WBC 9.9 04/19/2024 1435   RBC 3.96 05/10/2024 1210   HGB 12.0 05/10/2024 1210   HCT 37.9 05/10/2024 1210   PLT 245 05/10/2024 1210   MCV 95.7 05/10/2024 1210   MCH 30.3 05/10/2024 1210   MCHC 31.7 05/10/2024 1210   RDW 13.6 05/10/2024 1210   LYMPHSABS 2.8 05/10/2024 1210   MONOABS 0.7  05/10/2024 1210   EOSABS 0.3 05/10/2024 1210   BASOSABS 0.1 05/10/2024 1210    No results found for: POCLITH, LITHIUM   No results found for: PHENYTOIN, PHENOBARB, VALPROATE, CBMZ   .res Assessment: Plan:   Greater than 50% of 30 min video visit with patient was spent on counseling and coordination of care. Discussed recent decline with health again. Treated for large kidney stone and discovered umbilical fistula.  Multiple pending follow ups with specialist pending. Chronic pain complicates treatment. Pt does not want to adjust meds this visit.   We agreed to:   Continue Buspar   to 15 mg to  three times daily as needed. Continue Celexa  to 40 mg daily. Will take with food to help with nausea. Could make diarrhea worse in the beginning but may pass after a week or two.  To Continue Lunesta  5 mg at betime. Belsomra  was not approved by insurance. She is happy with Lunesta  but says causes metallic taste in mouth next day.  To increase Mirtazapine  to 15 mg daily at bedtime for sleep.  Will report worsening symptoms or side effects  promptly   To follow up in 12  weeks to reassess Will continue to follow up with PCP regularly Provided emergency contact information Reviewed PDMP      Redell A. Khamari Yousuf, NP    There are no diagnoses linked to this encounter.   Please see After Visit Summary for patient specific instructions.  Future Appointments  Date Time Provider Department Center  05/20/2024 12:10 PM [name], Donell Cardinal, MD [facility] None  06/05/2024 12:00 PM [facility] None  06/05/2024 12:15 PM [facility] INJ NURSE [facility] None  06/05/2024 12:30 PM Ennever, Maude SAUNDERS, MD [facility] None  06/05/2024 12:45 PM [facility] NURSE [facility] None  07/08/2024  1:30 PM Valdemar Redell, MD [facility] None    No orders of the defined types were placed in this encounter.     -------------------------------

## 2024-05-15 ENCOUNTER — Other Ambulatory Visit: Payer: Self-pay | Admitting: Hematology & Oncology

## 2024-05-15 DIAGNOSIS — Z85038 Personal history of other malignant neoplasm of large intestine: Secondary | ICD-10-CM

## 2024-05-15 DIAGNOSIS — A0472 Enterocolitis due to Clostridium difficile, not specified as recurrent: Secondary | ICD-10-CM

## 2024-05-15 DIAGNOSIS — C181 Malignant neoplasm of appendix: Secondary | ICD-10-CM

## 2024-05-16 ENCOUNTER — Other Ambulatory Visit: Payer: Self-pay | Admitting: Family Medicine

## 2024-05-20 ENCOUNTER — Other Ambulatory Visit: Payer: Self-pay

## 2024-05-20 ENCOUNTER — Ambulatory Visit (INDEPENDENT_AMBULATORY_CARE_PROVIDER_SITE_OTHER): Admitting: Internal Medicine

## 2024-05-20 ENCOUNTER — Encounter: Payer: Self-pay | Admitting: Internal Medicine

## 2024-05-20 ENCOUNTER — Encounter: Payer: Self-pay | Admitting: Hematology & Oncology

## 2024-05-20 VITALS — BP 124/82 | HR 67 | Ht 66.0 in | Wt 235.0 lb

## 2024-05-20 DIAGNOSIS — E1165 Type 2 diabetes mellitus with hyperglycemia: Secondary | ICD-10-CM | POA: Diagnosis not present

## 2024-05-20 DIAGNOSIS — D352 Benign neoplasm of pituitary gland: Secondary | ICD-10-CM

## 2024-05-20 DIAGNOSIS — E041 Nontoxic single thyroid nodule: Secondary | ICD-10-CM | POA: Diagnosis not present

## 2024-05-20 DIAGNOSIS — Z794 Long term (current) use of insulin: Secondary | ICD-10-CM | POA: Diagnosis not present

## 2024-05-20 DIAGNOSIS — R7989 Other specified abnormal findings of blood chemistry: Secondary | ICD-10-CM

## 2024-05-20 LAB — POCT GLYCOSYLATED HEMOGLOBIN (HGB A1C): Hemoglobin A1C: 9.3 % — AB (ref 4.0–5.6)

## 2024-05-20 LAB — POCT GLUCOSE (DEVICE FOR HOME USE): POC Glucose: 304 mg/dL — AB (ref 70–99)

## 2024-05-20 LAB — PROLACTIN: Prolactin: <1 ng/mL — ABNORMAL LOW

## 2024-05-20 MED ORDER — LANTUS SOLOSTAR 100 UNIT/ML ~~LOC~~ SOPN: 20.0000 [IU] | PEN_INJECTOR | Freq: Every day | SUBCUTANEOUS | 4 refills | Status: DC

## 2024-05-20 MED ORDER — DEXCOM G7 SENSOR MISC
1.0000 | 3 refills | Status: DC
Start: 2024-05-20 — End: 2024-05-20

## 2024-05-20 MED ORDER — DEXCOM G7 SENSOR MISC: 1.0000 | 3 refills | Status: AC

## 2024-05-20 MED ORDER — INSULIN PEN NEEDLE 32G X 4 MM MISC
1.0000 | Freq: Every day | 3 refills | Status: DC
Start: 2024-05-20 — End: 2024-07-29

## 2024-05-20 NOTE — Progress Notes (Unsigned)
 Name: Wendy Barber  MRN/ DOB: [id number], 06-Feb-1961    Age/ Sex: 63 y.o., female    PCP: [name], MD   Reason for Endocrinology Evaluation: Pituitary adenoma     Date of Initial Endocrinology Evaluation: 04/17/2023    HPI: [name] is a 63 y.o. female with a past medical history of HTN, CAD, Hx DVT/PE, Cancer of appendic with mets , autoimmune hepatitis and DM. The patient presented for initial endocrinology clinic visit on 04/17/2023 for consultative assistance with her pituitary adenoma.   During evaluation by neurology for vertigo and cerebral meningioma she was noted to have a pituitary adenoma on brain MRI 2023    Patient follows with oncology for history of metastatic appendiceal cancer, s/p surgery 2022    Patient had an incidental finding of thyroid  nodule on CT scan which prompted thyroid  ultrasound 11/2022, revealing a right inferior 2.3 cm nodule meeting follow-up criteria.  S/p benign FNA of the right inferior nodule 02/27/2024  S/P hysterectomy 2004 , no HRT     Pituitary labs showed elevated ACTH  at 76.3 pg/mL ( 7.2-63.3) , 24 hr urine cortisol was normal , she also had elevated Prolactin 49.9 ng/dL ),   IGF -1 was normal as well as TFT's.   Started cabergoline  05/2023 with a prolactin of 49.9 NG/mL  24-hour urinary cortisol normal at 9.3 on 01/23/2024, serum cortisol normal at 15.6 mcg   DM HISTORY: She has been diagnosed with DM many years ago, she was on insulin  at some point ( basal/prandial )which was discontinued in 2021, prior to cancer surgery.   Her BG's have remained stable until 2024 when she was noted with hypoglycemia again.   She was started on glimepiride  by her oncologist in 2024 I switch from glimepiride  to glipizide  12/2023   SUBJECTIVE:      Today (05/20/24):  Wendy Barber is here for follow-up on pituitary macroadenoma, MNG and DM .  She does check glucose at home occasionally   She has been following up at  [facility] for malignant pseudomyxoma peritonei  She was evaluated by ID for chronic diarrhea, she was prescribed vancomycin  for abdominal wall abscess.  She is s/p I&D with surgery  She does continue to follow-up with OB/GYN for vulvar irritation and history of recurrent UTIs  She was evaluated by [facility] general surgery for history of appendiceal cancer and large-volume diarrhea.  Diverting ileostomy was recommended, she has not made a decision yet  She continues on  lanreotide injections -monthly  she is being treated for a presumptive diagnosis of carcinoid.  She had a follow-up with [facility] (Dr. Lanis), no surgery was recommended for the enlarging meningioma as the risk outweighs the benefit  She is S/P cystoscopy for renal stones 03/2024  Patient has been noted weight loss She continues with back pain, on pain meds and flexeril   She has unsteady gait  She is having spells of confusion when she first wakes up  Has been tired with hypersomnia No fever but has chills  Denies local neck swelling  Headaches are worsening in nature  Continues with diarrhea    cabergoline  0.5 mg, half a  tablet twice weekly ( Sunday and Wednesday)  Glipizide  5 mg twice daily   HISTORY:  Past Medical History:  Past Medical History:  Diagnosis Date   Arthritis    Autoimmune hepatitis (HCC)    Back pain    Cancer (HCC)    pseudomyxoma  peritonei, liver   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   Cardiomegaly 12/08/2022   without congestive failure, noted on ECHO   Cerebral meningioma (HCC)    Chronic diarrhea    Chronic fatigue syndrome    Diabetes mellitus without complication (HCC)    DVT of deep femoral vein, left (HCC) 03/19/2020   Fibromyalgia    GERD (gastroesophageal reflux disease)    Goals of care, counseling/discussion 03/19/2020   History of blood transfusion    Hypertension    Incomplete right  bundle branch block (RBBB) 12/08/2023   Noted on EKG   Iron deficiency anemia due to chronic blood loss 05/14/2021   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   Migraines    Multiple thyroid  nodules    Pernicious anemia 05/14/2021   Pituitary adenoma (HCC)    PONV (postoperative nausea and vomiting)    not with recent surgeries   Presence of IVC filter 03/19/2020   Pulmonary embolism, bilateral (HCC) 03/19/2020   Rectal bleed 12/29/2023   Short bowel syndrome, unspecified    Vertigo    Past Surgical History:  Past Surgical History:  Procedure Laterality Date   ABDOMINAL SURGERY  2005   cytoreductive surgery with splenectomy, [facility]   ABDOMINAL SURGERY  2022   cytoreductive surgery with splenectomy, [facility]   ACHILLES TENDON REPAIR Bilateral    ARTHROPLASTY Bilateral    Shoulder   BIOPSY THYROID      CARPAL TUNNEL RELEASE Bilateral    possibly right x2   CHOLECYSTECTOMY  07/1994   CYSTOSCOPY/URETEROSCOPY/HOLMIUM LASER/STENT PLACEMENT Left 03/26/2024   Procedure: CYSTOSCOPY/URETEROSCOPY/HOLMIUM LASER/STENT PLACEMENT;  Surgeon: Elisabeth Valli BIRCH, MD;  Location: [facility];  Service: Urology;  Laterality: Left;  CYSTOSCOPY/LEFT URETEROSCOPY/HOLMIUM LASER/STENT PLACEMENT/RETROGRADE PYELOGRAM   IR CATHETER TUBE CHANGE  02/02/2021   IR CATHETER TUBE CHANGE  02/25/2021   IR IMAGING GUIDED PORT INSERTION  06/20/2022   IR RADIOLOGIST EVAL & MGMT  02/24/2021   IR RADIOLOGIST EVAL & MGMT  03/10/2021   IR RADIOLOGIST EVAL & MGMT  06/22/2022   IR THROMBECT VENO MECH MOD SED  06/20/2022   IR US  GUIDE BX ASP/DRAIN  11/25/2019   IR US  GUIDE VASC ACCESS LEFT  06/20/2022   IR US  GUIDE VASC ACCESS RIGHT  06/20/2022   IR US  GUIDE VASC ACCESS RIGHT  06/20/2022   IR VENO/EXT/BI  06/20/2022   IR VENOCAVAGRAM IVC  06/20/2022   JOINT REPLACEMENT Bilateral    knees   KNEE ARTHROSCOPY     ORIF ANKLE FRACTURE Right 08/27/2019   Procedure: OPEN REDUCTION INTERNAL FIXATION RIGHT ANKLE FRACTURE;   Surgeon: Beverley Evalene BIRCH, MD;  Location: [facility];  Service: Orthopedics;  Laterality: Right;   perineorrophy     SPINAL FUSION     TARSAL TUNNEL RELEASE Right    TONGUE BIOPSY     TOTAL ABDOMINAL HYSTERECTOMY Bilateral 2004   with BSO and appendectomy    Social History:  reports that she has never smoked. She has never used smokeless tobacco. She reports that she does not drink alcohol and does not use drugs. Family History: family history includes Alcohol abuse in her brother; Depression in her mother; Drug abuse in her brother and sister; Suicidality in her sister.   HOME MEDICATIONS: Allergies as of 05/20/2024       Reactions   Ativan [lorazepam] Swelling, Other (See Comments)   Face & Throat Swelling  Note: tolerates midazolam  fine   Corticosteroids Other (See Comments)   Psychotic behavior  Penicillins Shortness Of Breath, Other (See Comments)   Irregular and rapid Heart Rate, too   Alprazolam Hives, Other (See Comments)   Hard to arouse, unresponsiveness also   Erythromycin Nausea And Vomiting      Gabapentin  Other (See Comments)   Made the patient feel depressed   Prednisolone Anxiety   Prednisone Anxiety, Other (See Comments)   Anxiety & Nervous Breakdown   Savella  [milnacipran] Other (See Comments)   Reaction not noted        Medication List        Accurate as of May 20, 2024  7:30 AM. If you have any questions, ask your nurse or doctor.          acetaminophen  500 MG tablet Commonly known as: TYLENOL  Take 1,000 mg by mouth every 6 (six) hours as needed for moderate pain (pain score 4-6).   amLODipine  5 MG tablet Commonly known as: NORVASC  TAKE 1 TABLET(5 MG) BY MOUTH DAILY   busPIRone  15 MG tablet Commonly known as: BUSPAR  Take 1 tablet (15 mg total) by mouth 3 (three) times daily.   cabergoline  0.5 MG tablet Commonly known as: DOSTINEX  Take 0.5 tablets (0.25 mg total) by mouth 2 (two) times a week.   citalopram  40 MG tablet Commonly  known as: CELEXA  Take 1 tablet (40 mg total) by mouth daily.   cyclobenzaprine  10 MG tablet Commonly known as: FLEXERIL  Take 0.5-1 tablets (5-10 mg total) by mouth 3 (three) times daily as needed for muscle spasms (start qhs prn due to sedation).   dicyclomine  20 MG tablet Commonly known as: BENTYL  TAKE 1 TABLET(20 MG) BY MOUTH THREE TIMES DAILY BEFORE MEALS   diphenoxylate -atropine  2.5-0.025 MG tablet Commonly known as: LOMOTIL  TAKE 2 TABLETS BY MOUTH FOUR TIMES DAILY AS NEEDED FOR LOOSE STOOLS OR DIARRHEA   eszopiclone  2 MG Tabs tablet Commonly known as: LUNESTA  TAKE 1 TABLET(2 MG) BY MOUTH AT BEDTIME AS NEEDED FOR SLEEP   famotidine  20 MG tablet Commonly known as: PEPCID  TAKE 1 TABLET(20 MG) BY MOUTH TWICE DAILY   fondaparinux  10 MG/0.8ML Soln injection Commonly known as: Arixtra  Inject 0.8 mLs (10 mg total) into the skin daily.   glipiZIDE  5 MG tablet Commonly known as: GLUCOTROL  Take 1 tablet (5 mg total) by mouth 2 (two) times daily before a meal.   hydrocortisone  2.5 % rectal cream Commonly known as: ANUSOL -HC PLACE RECTALLY 4 TIMES DAILY AS NEEDED FOR HEMORRHOIDS OR ANAL ITCHING   HYDROmorphone  4 MG tablet Commonly known as: Dilaudid  Take 1 tablet (4 mg total) by mouth every 6 (six) hours as needed for severe pain (pain score 7-10).   hyoscyamine  0.125 MG Tbdp disintergrating tablet Commonly known as: ANASPAZ  Place 1 tablet (0.125 mg total) under the tongue every 6 (six) hours as needed.   lidocaine -hydrocortisone  3-1 % Kit Commonly known as: ANAMANTLE Place 1 Application rectally 2 (two) times daily.   loperamide  2 MG capsule Commonly known as: IMODIUM  Take 2 capsules (4 mg total) by mouth 4 (four) times daily as needed for diarrhea or loose stools.   meclizine  25 MG tablet Commonly known as: ANTIVERT  TAKE 1 TABLET(25 MG) BY MOUTH THREE TIMES DAILY AS NEEDED FOR DIZZINESS   metroNIDAZOLE  500 MG tablet Commonly known as: FLAGYL  Take 1 tablet (500 mg  total) by mouth 2 (two) times daily.   mirtazapine  15 MG tablet Commonly known as: REMERON  Take 1 tablet (15 mg total) by mouth at bedtime.   ondansetron  8 MG tablet Commonly known as:  ZOFRAN  TAKE 1 TABLET(8 MG) BY MOUTH EVERY 8 HOURS AS NEEDED FOR NAUSEA OR VOMITING   orphenadrine  100 MG tablet Commonly known as: NORFLEX  TAKE 1 TABLET(100 MG) BY MOUTH AT BEDTIME AS NEEDED FOR MUSCLE SPASMS   pantoprazole  40 MG tablet Commonly known as: PROTONIX  TAKE 1 TABLET BY MOUTH EVERY DAY BEFORE BREAKFAST   pramoxine-hydrocortisone  1-1 % rectal cream Commonly known as: PROCTOCREAM-HC APPLY RECTALLY TO THE AFFECTED AREA TWICE DAILY What changed: See the new instructions.   promethazine  12.5 MG tablet Commonly known as: PHENERGAN  TAKE 1 TABLET(12.5 MG) BY MOUTH EVERY 6 HOURS AS NEEDED FOR NAUSEA OR VOMITING   propranolol  20 MG tablet Commonly known as: INDERAL  Take 1 tablet (20 mg total) by mouth 2 (two) times daily.   rOPINIRole  1 MG tablet Commonly known as: REQUIP  Take 1 tablet (1 mg total) by mouth at bedtime.   tamsulosin  0.4 MG Caps capsule Commonly known as: FLOMAX  Take 1 capsule (0.4 mg total) by mouth at bedtime.   terconazole  0.4 % vaginal cream Commonly known as: TERAZOL 7  Apply topically twice daily for up to 7 days          REVIEW OF SYSTEMS: A comprehensive ROS was conducted with the patient and is negative except as per HPI     OBJECTIVE:  VS:BP 124/82 (BP Location: Left Arm, Patient Position: Sitting, Cuff Size: Normal)   Pulse 67   Ht 5' 6 (1.676 m)   Wt 235 lb (106.6 kg)   SpO2 96%   BMI 37.93 kg/m      Wt Readings from Last 3 Encounters:  05/08/24 245 lb 1.6 oz (111.2 kg)  04/29/24 245 lb 12.8 oz (111.5 kg)  04/19/24 236 lb (107 kg)     EXAM: General: Pt appears well and is in NAD  Neck: General: Supple without adenopathy. Thyroid : Thyroid  size normal.  No goiter or nodules appreciated.  Lungs: Clear with good BS bilat   Heart:  Auscultation: RRR.  Abdomen: Soft, nontender  Extremities:  BL LE: No pretibial edema   Mental Status: Judgment, insight: Intact Orientation: Oriented to time, place, and person Mood and affect: No depression, anxiety, or agitation     DATA REVIEWED:  Latest Reference Range & Units 05/20/24 13:00  Prolactin ng/mL <1.0 (L)     Latest Reference Range & Units 01/19/24 14:15  C206 ACTH  6 - 50 pg/mL 58 (H)  Cortisol, Plasma mcg/dL 84.3  FSH mIU/mL 78.9  Prolactin ng/mL <1.0 (L)  Glucose 65 - 99 mg/dL 782 (H)  TSH 9.59 - 5.49 mIU/L 1.20  T4,Free(Direct) 0.8 - 1.8 ng/dL 1.0        Latest Reference Range & Units 05/08/24 12:10  Sodium 135 - 145 mmol/L 138  Potassium 3.5 - 5.1 mmol/L 3.9  Chloride 98 - 111 mmol/L 101  CO2 22 - 32 mmol/L 29  Glucose 70 - 99 mg/dL 646 (H)  BUN 8 - 23 mg/dL 21  Creatinine 9.55 - 8.99 mg/dL 8.91 (H)  Calcium  8.9 - 10.3 mg/dL 9.5  Anion gap 5 - 15  8  Alkaline Phosphatase 38 - 126 U/L 191 (H)  Albumin 3.5 - 5.0 g/dL 4.0  AST 15 - 41 U/L 44 (H)  ALT 0 - 44 U/L 30  Total Protein 6.5 - 8.1 g/dL 7.1  Total Bilirubin 0.0 - 1.2 mg/dL 0.4  GFR, Est Non African American >60 mL/min 58 (L)      Latest Reference Range & Units 04/17/23 12:17  Insulin -Like GF-1  60 - 207 ng/mL 92  FSH 25.8 - 134.8 mIU/mL 21.8 (L)  Prolactin 3.6 - 25.2 ng/mL 49.9 (H)  Glucose 70 - 99 mg/dL 859 (H)     MRI brain 5/81/7974   Brain: No abnormality affects the brainstem. There is continued enlargement of a meningioma associated with the medial aspect of the left tentorial leaflet with a broad surface along the medial left transverse sinus and the torcula. Maximal dimension in the sagittal plane today measures 22 x 19 mm compared with 22 x 15 mm in August of 2024 and 21 x 13 mm in September of 2023.   Cerebral hemispheres show a very few punctate foci of T2 and FLAIR signal in the white matter, not likely significant. There is not a pattern of widespread or  significant small-vessel disease. No cortical or large vessel territory stroke. No hydrocephalus. No extra-axial fluid collection.   Within the central to left side of the pituitary gland, there is a macro adenoma measuring today 15 x 12 x 10 mm. When measured using the same technique, this is the same size. Small nonenhancing cystic component inferiorly again noted. No definite cavernous sinus invasion.   Vascular: Major vessels at the base of the brain show flow.   Skull and upper cervical spine: Negative   Sinuses/Orbits: Clear/normal   Other: None   IMPRESSION: 1. Continued enlargement of a meningioma associated with the medial aspect of the left tentorial leaflet with a broad surface along the medial left transverse sinus and the torcula. Maximal dimension in the sagittal plane today measures 22 x 19 mm compared with 22 x 15 mm in August of 2024 and 21 x 13 mm in September of 2023. 2. Stable pituitary macro adenoma measuring 15 x 12 x 10 mm.       Thyroid  Ultrasound 02/07/2024 Estimated total number of nodules >/= 1 cm: 1   Number of spongiform nodules >/=  2 cm not described below (TR1): 0   Number of mixed cystic and solid nodules >/= 1.5 cm not described below (TR2): 0   _________________________________________________________   Nodule # 1: Changing morphology and appearance of the nodule in the right lower gland. On today's exam, the nodule is more solid and hypoechoic and demonstrates taller than wide morphology in the transverse view. At 1.6 x 1.1 x 1.4 cm, this TI-RADS category 4 nodule now meets criteria for biopsy. **Given size (>/= 1.5 cm) and appearance, fine needle aspiration of this moderately suspicious nodule should be considered based on TI-RADS criteria.   IMPRESSION: Changing morphology of the nodule in the right inferior gland which is now considered TI-RADS category 4 and meets criteria for biopsy. Biopsy is recommended.  FNA right  inferior nodule 02/27/2024   Clinical History: Changing morphology and appearance of the nodule in  the right lower gland.  On todays exam, the nodule is more solid and  hypoechoic and demonstrates taller than wide morphology in the  transverse view.  At 1.6 x 1.1 x 1.4cm TI-RADS - 4  Specimen Submitted:  A. THYROID , RIGHT LOWER, FINE NEEDL0E ASPIRATION:    FINAL MICROSCOPIC DIAGNOSIS:  - Benign follicular nodule (Bethesda category II)    In office BG 304 mg/dL     ASSESSMENT/PLAN/RECOMMENDATIONS:   Pituitary Macroadenoma:    -Pituitary MRI showed 11 mm pituitary adenoma, no invasion or mass effect on adjacent structures - Repeat MRI showed stable pituitary adenoma ,but Increase size of meningioma, neurosurgery didn't recommend sx as the risk outweighs the risk  -  Historically FSH is inappropriately normal, IGF- I  normal    2.  Hyperprolactinemia:   -It is difficult to ascertain if this is due to prolactinoma or stalk effect -Prolactin is low, will discontinue  cabergoline  as below  Medication Stop  cabergoline  0.5 mg, half a tablet twice weekly   3. Elevated ACTH :   -This has been slightly elevated with normal 24-hour urinary cortisol in 2024 and 2025 -Differential diagnosis includes paradoxical effect from somatostatin intake, Magod-ACTH , ectopic ACTH  secretion     4.  Right thyroid  nodule:   -No local neck symptoms -TFTs are normal - S/p benign FNA 02/2024    5. T2DM, poorly controlled: A1c 9.3%  -Encourage frequent glucose checks at home -Historically used to be on basal/prandial insulin  but has been off insulin  since 2021 -She is currently on glipizide  with persistent hyperglycemia, I have recommended adding basal insulin , discussed risk of hypoglycemia with combination of glipizide  and Lantus , emphasized the importance of taking glipizide  15-20 minutes before each meal - Dexcom will be prescribed to her DME supplier  Medication Continue glipizide  5 mg BID   Start Lantus  20 units daily   F/U in 3 months   Signed electronically by: Stefano Redgie Butts, MD  [facility] Endocrinology  [facility] [address] [place] [place], [place] [address] Phone: [phone] FAX: [phone]   CC: [name], MD [id number] [place] [address] Phone: [phone] Fax: [phone]   Return to Endocrinology clinic as below: Future Appointments  Date Time Provider Department Center  05/20/2024 12:10 PM Shonica Weier, Donell Redgie, MD [facility] None  06/05/2024 12:00 PM [facility] LAB [facility] None  06/05/2024 12:15 PM [facility] INJ NURSE [facility] None  06/05/2024 12:30 PM Ennever, Maude SAUNDERS, MD [facility] None  06/05/2024 12:45 PM [facility] INJ NURSE [facility] None  07/08/2024  1:30 PM Valdemar Rogue, MD [facility] None  08/16/2024  2:30 PM Teresa Rogue LABOR, NP [facility] None

## 2024-05-20 NOTE — Patient Instructions (Signed)
 Start Lantus  20 units daily Continue glipizide  5 mg, 1 tablet before breakfast 1 tablet before supper    HOW TO TREAT LOW BLOOD SUGARS (Blood sugar LESS THAN 70 MG/DL) Please follow the RULE OF 15 for the treatment of hypoglycemia treatment (when your (blood sugars are less than 70 mg/dL)   STEP 1: Take 15 grams of carbohydrates when your blood sugar is low, which includes:  3-4 GLUCOSE TABS  OR 3-4 OZ OF JUICE OR REGULAR SODA OR ONE TUBE OF GLUCOSE GEL    STEP 2: RECHECK blood sugar in 15 MINUTES STEP 3: If your blood sugar is still low at the 15 minute recheck --> then, go back to STEP 1 and treat AGAIN with another 15 grams of carbohydrates.

## 2024-05-21 ENCOUNTER — Other Ambulatory Visit: Payer: Self-pay | Admitting: Family Medicine

## 2024-05-21 ENCOUNTER — Ambulatory Visit: Payer: Self-pay | Admitting: Internal Medicine

## 2024-05-21 DIAGNOSIS — R12 Heartburn: Secondary | ICD-10-CM

## 2024-05-25 ENCOUNTER — Other Ambulatory Visit: Payer: Self-pay | Admitting: Hematology & Oncology

## 2024-05-25 DIAGNOSIS — H8112 Benign paroxysmal vertigo, left ear: Secondary | ICD-10-CM

## 2024-06-03 ENCOUNTER — Other Ambulatory Visit: Payer: Self-pay | Admitting: Hematology & Oncology

## 2024-06-03 DIAGNOSIS — C772 Secondary and unspecified malignant neoplasm of intra-abdominal lymph nodes: Secondary | ICD-10-CM

## 2024-06-03 DIAGNOSIS — A0472 Enterocolitis due to Clostridium difficile, not specified as recurrent: Secondary | ICD-10-CM

## 2024-06-03 DIAGNOSIS — Z85038 Personal history of other malignant neoplasm of large intestine: Secondary | ICD-10-CM

## 2024-06-04 ENCOUNTER — Encounter: Payer: Self-pay | Admitting: Hematology & Oncology

## 2024-06-05 ENCOUNTER — Inpatient Hospital Stay: Attending: Hematology & Oncology

## 2024-06-05 ENCOUNTER — Encounter: Payer: Self-pay | Admitting: Hematology & Oncology

## 2024-06-05 ENCOUNTER — Inpatient Hospital Stay

## 2024-06-05 ENCOUNTER — Inpatient Hospital Stay: Admitting: Hematology & Oncology

## 2024-06-05 VITALS — BP 139/88 | HR 63 | Temp 97.9°F | Resp 18 | Ht 66.0 in | Wt 236.0 lb

## 2024-06-05 DIAGNOSIS — D510 Vitamin B12 deficiency anemia due to intrinsic factor deficiency: Secondary | ICD-10-CM

## 2024-06-05 DIAGNOSIS — I82412 Acute embolism and thrombosis of left femoral vein: Secondary | ICD-10-CM | POA: Diagnosis not present

## 2024-06-05 DIAGNOSIS — C772 Secondary and unspecified malignant neoplasm of intra-abdominal lymph nodes: Secondary | ICD-10-CM

## 2024-06-05 DIAGNOSIS — Z881 Allergy status to other antibiotic agents status: Secondary | ICD-10-CM | POA: Diagnosis not present

## 2024-06-05 DIAGNOSIS — C181 Malignant neoplasm of appendix: Secondary | ICD-10-CM

## 2024-06-05 DIAGNOSIS — I2699 Other pulmonary embolism without acute cor pulmonale: Secondary | ICD-10-CM

## 2024-06-05 DIAGNOSIS — Z7901 Long term (current) use of anticoagulants: Secondary | ICD-10-CM | POA: Diagnosis not present

## 2024-06-05 DIAGNOSIS — R519 Headache, unspecified: Secondary | ICD-10-CM | POA: Insufficient documentation

## 2024-06-05 DIAGNOSIS — Z888 Allergy status to other drugs, medicaments and biological substances status: Secondary | ICD-10-CM | POA: Diagnosis not present

## 2024-06-05 DIAGNOSIS — M549 Dorsalgia, unspecified: Secondary | ICD-10-CM | POA: Insufficient documentation

## 2024-06-05 DIAGNOSIS — Z79899 Other long term (current) drug therapy: Secondary | ICD-10-CM | POA: Diagnosis not present

## 2024-06-05 DIAGNOSIS — M79606 Pain in leg, unspecified: Secondary | ICD-10-CM | POA: Diagnosis not present

## 2024-06-05 DIAGNOSIS — Z880 Allergy status to penicillin: Secondary | ICD-10-CM | POA: Insufficient documentation

## 2024-06-05 DIAGNOSIS — E119 Type 2 diabetes mellitus without complications: Secondary | ICD-10-CM | POA: Diagnosis not present

## 2024-06-05 DIAGNOSIS — K529 Noninfective gastroenteritis and colitis, unspecified: Secondary | ICD-10-CM | POA: Insufficient documentation

## 2024-06-05 DIAGNOSIS — R5383 Other fatigue: Secondary | ICD-10-CM | POA: Insufficient documentation

## 2024-06-05 DIAGNOSIS — R300 Dysuria: Secondary | ICD-10-CM | POA: Diagnosis not present

## 2024-06-05 DIAGNOSIS — G8929 Other chronic pain: Secondary | ICD-10-CM | POA: Diagnosis not present

## 2024-06-05 DIAGNOSIS — R109 Unspecified abdominal pain: Secondary | ICD-10-CM | POA: Insufficient documentation

## 2024-06-05 DIAGNOSIS — M48061 Spinal stenosis, lumbar region without neurogenic claudication: Secondary | ICD-10-CM | POA: Diagnosis not present

## 2024-06-05 LAB — CMP (CANCER CENTER ONLY)
ALT: 39 U/L (ref 0–44)
AST: 42 U/L — ABNORMAL HIGH (ref 15–41)
Albumin: 3.9 g/dL (ref 3.5–5.0)
Alkaline Phosphatase: 208 U/L — ABNORMAL HIGH (ref 38–126)
Anion gap: 12 (ref 5–15)
BUN: 12 mg/dL (ref 8–23)
CO2: 27 mmol/L (ref 22–32)
Calcium: 9 mg/dL (ref 8.9–10.3)
Chloride: 101 mmol/L (ref 98–111)
Creatinine: 0.89 mg/dL (ref 0.44–1.00)
GFR, Estimated: >60 mL/min (ref >60)
Glucose, Bld: 292 mg/dL — ABNORMAL HIGH (ref 70–99)
Potassium: 3.4 mmol/L — ABNORMAL LOW (ref 3.5–5.1)
Sodium: 140 mmol/L (ref 135–145)
Total Bilirubin: 0.5 mg/dL (ref 0.0–1.2)
Total Protein: 7.3 g/dL (ref 6.5–8.1)

## 2024-06-05 LAB — CBC WITH DIFFERENTIAL (CANCER CENTER ONLY)
Abs Immature Granulocytes: 0.03 K/uL (ref 0.00–0.07)
Basophils Absolute: 0.1 K/uL (ref 0.0–0.1)
Basophils Relative: 1 %
Eosinophils Absolute: 0.2 K/uL (ref 0.0–0.5)
Eosinophils Relative: 3 %
HCT: 39.9 % (ref 36.0–46.0)
Hemoglobin: 12.9 g/dL (ref 12.0–15.0)
Immature Granulocytes: 0 %
Lymphocytes Relative: 35 %
Lymphs Abs: 2.8 K/uL (ref 0.7–4.0)
MCH: 30.4 pg (ref 26.0–34.0)
MCHC: 32.3 g/dL (ref 30.0–36.0)
MCV: 94.1 fL (ref 80.0–100.0)
Monocytes Absolute: 0.7 K/uL (ref 0.1–1.0)
Monocytes Relative: 9 %
Neutro Abs: 4.2 K/uL (ref 1.7–7.7)
Neutrophils Relative %: 52 %
Platelet Count: 121 K/uL — ABNORMAL LOW (ref 150–400)
RBC: 4.24 MIL/uL (ref 3.87–5.11)
RDW: 13.6 % (ref 11.5–15.5)
WBC Count: 8 K/uL (ref 4.0–10.5)
nRBC: 0 % (ref 0.0–0.2)

## 2024-06-05 LAB — IRON AND IRON BINDING CAPACITY (CC-WL,HP ONLY)
Iron: 93 ug/dL (ref 28–170)
Saturation Ratios: 23 % (ref 10.4–31.8)
TIBC: 403 ug/dL (ref 250–450)
UIBC: 310 ug/dL

## 2024-06-05 LAB — FERRITIN: Ferritin: 37 ng/mL (ref 11–307)

## 2024-06-05 LAB — VITAMIN B12: Vitamin B-12: 251 pg/mL (ref 180–914)

## 2024-06-05 MED ORDER — LANREOTIDE ACETATE 120 MG/0.5ML ~~LOC~~ SOLN
120.0000 mg | Freq: Once | SUBCUTANEOUS | Status: AC
  Administered 2024-06-05 (×2): 120 mg via SUBCUTANEOUS
  Filled 2024-06-05: qty 120

## 2024-06-05 NOTE — Patient Instructions (Signed)

## 2024-06-05 NOTE — Progress Notes (Signed)
 Hematology and Oncology Follow Up Visit  Wendy Barber [id number] [birth year] 63 y.o. 06/05/2024   Principle Diagnosis:  History of metastatic appendiceal cancer  -- recurrent  Superior mesenteric vein thrombus Thromboembolism of the RIGHT leg Pernicious anemia Acute pulmonary embolism-segmental right pulmonary artery Carcinoid/neuroendocrine carcinoma  Current Therapy:   HIPEC - Surgery done in [place] in 11/2020 Arixtra  10 mg subcu daily --start on 2/16 2024 Vitamin B12 5000 mcg PO daily  Somatuline 120 mg IM monthly-start on 07/12/2023     Interim History:  Wendy Barber is in for follow-up.  She has had a very tough 2 weeks.  She has had quite a bit of vomiting.  Normal sounds like she may have had some transient obstruction.  She and close to go to the emergency room but she held off.  Now, her back is gone now.  She has had lower back surgery.  I think she has an appointment with Neurosurgery next week.  She has she had CT of the spine on 05/08/2024.  This showed severe spinal stenosis at L2-3 and severe left lateral recess narrowing at L3-4.  Everything is unchanged from prior scans.  I know she has had very difficult times.  I just feel bad that she would seem to have a problem.  I know that she is trying hard.  There are just things that happened that she has no control over.  Of course, she still has the bad diarrhea.  Her last Chromogranin A level was 37.  She has had normal CEA, CA-125 and CA 19-9.  There is been no fever.  She has had no bleeding.  She continues on her Arixtra .  She has some chronic leg pain.  There is been little bit of a headache.  When we last checked her iron studies, ferritin was 52 with an iron saturation of 24%.  Her diabetes is still not under good control.  Today, her blood sugar was 292.  I think she has been started on insulin .  Again, the Endocrinologist is working closely with her.  Her last TSH a couple weeks ago was  1.4.  Currently, I would have to say that her performance status is probably ECOG 2.   Wt Readings from Last 3 Encounters:  06/05/24 236 lb (107 kg)  05/20/24 235 lb (106.6 kg)  05/08/24 245 lb 1.6 oz (111.2 kg)    Medications:  Current Outpatient Medications:    acetaminophen  (TYLENOL ) 500 MG tablet, Take 1,000 mg by mouth every 6 (six) hours as needed for moderate pain (pain score 4-6)., Disp: , Rfl:    amLODipine  (NORVASC ) 5 MG tablet, TAKE 1 TABLET(5 MG) BY MOUTH DAILY, Disp: 90 tablet, Rfl: 1   busPIRone  (BUSPAR ) 15 MG tablet, Take 1 tablet (15 mg total) by mouth 3 (three) times daily., Disp: 270 tablet, Rfl: 0   citalopram  (CELEXA ) 40 MG tablet, Take 1 tablet (40 mg total) by mouth daily., Disp: 90 tablet, Rfl: 1   Continuous Glucose Sensor (DEXCOM G7 SENSOR) MISC, 1 Device by Does not apply route as directed., Disp: 9 each, Rfl: 3   cyclobenzaprine  (FLEXERIL ) 10 MG tablet, Take 0.5-1 tablets (5-10 mg total) by mouth 3 (three) times daily as needed for muscle spasms (start qhs prn due to sedation)., Disp: 30 tablet, Rfl: 0   dicyclomine  (BENTYL ) 20 MG tablet, TAKE 1 TABLET(20 MG) BY MOUTH THREE TIMES DAILY BEFORE MEALS, Disp: 90 tablet, Rfl: 4   diphenoxylate -atropine  (LOMOTIL ) 2.5-0.025 MG tablet, TAKE 2 TABLETS BY  MOUTH FOUR TIMES DAILY AS NEEDED FOR LOOSE STOOLS OR DIARRHEA, Disp: 240 tablet, Rfl: 2   eszopiclone  (LUNESTA ) 2 MG TABS tablet, TAKE 1 TABLET(2 MG) BY MOUTH AT BEDTIME AS NEEDED FOR SLEEP, Disp: 30 tablet, Rfl: 1   famotidine  (PEPCID ) 20 MG tablet, TAKE 1 TABLET(20 MG) BY MOUTH TWICE DAILY, Disp: 60 tablet, Rfl: 0   fondaparinux  (ARIXTRA ) 10 MG/0.8ML SOLN injection, Inject 0.8 mLs (10 mg total) into the skin daily., Disp: 72 mL, Rfl: 3   glipiZIDE  (GLUCOTROL ) 5 MG tablet, Take 1 tablet (5 mg total) by mouth 2 (two) times daily before a meal., Disp: 180 tablet, Rfl: 3   hydrocortisone  (ANUSOL -HC) 2.5 % rectal cream, PLACE RECTALLY 4 TIMES DAILY AS NEEDED FOR HEMORRHOIDS OR  ANAL ITCHING, Disp: 30 g, Rfl: 1   HYDROmorphone  (DILAUDID ) 4 MG tablet, Take 1 tablet (4 mg total) by mouth every 6 (six) hours as needed for severe pain (pain score 7-10)., Disp: 60 tablet, Rfl: 0   insulin  glargine (LANTUS  SOLOSTAR) 100 UNIT/ML Solostar Pen, Inject 20 Units into the skin daily., Disp: 30 mL, Rfl: 4   Insulin  Pen Needle 32G X 4 MM MISC, 1 Device by Does not apply route daily in the afternoon., Disp: 100 each, Rfl: 3   lidocaine -hydrocortisone  (ANAMANTLE) 3-1 % KIT, Place 1 Application rectally 2 (two) times daily., Disp: 1 kit, Rfl: 0   loperamide  (IMODIUM ) 2 MG capsule, Take 2 capsules (4 mg total) by mouth 4 (four) times daily as needed for diarrhea or loose stools., Disp: , Rfl:    meclizine  (ANTIVERT ) 25 MG tablet, TAKE 1 TABLET(25 MG) BY MOUTH THREE TIMES DAILY AS NEEDED FOR DIZZINESS, Disp: 60 tablet, Rfl: 0   mirtazapine  (REMERON ) 15 MG tablet, Take 1 tablet (15 mg total) by mouth at bedtime., Disp: 90 tablet, Rfl: 1   ondansetron  (ZOFRAN ) 8 MG tablet, TAKE 1 TABLET(8 MG) BY MOUTH EVERY 8 HOURS AS NEEDED FOR NAUSEA OR VOMITING, Disp: 30 tablet, Rfl: 2   orphenadrine  (NORFLEX ) 100 MG tablet, TAKE 1 TABLET(100 MG) BY MOUTH AT BEDTIME AS NEEDED FOR MUSCLE SPASMS, Disp: 30 tablet, Rfl: 2   pantoprazole  (PROTONIX ) 40 MG tablet, TAKE 1 TABLET BY MOUTH EVERY DAY BEFORE BREAKFAST, Disp: 90 tablet, Rfl: 1   pramoxine-hydrocortisone  (PROCTOCREAM-HC) 1-1 % rectal cream, APPLY RECTALLY TO THE AFFECTED AREA TWICE DAILY, Disp: 30 g, Rfl: 3   promethazine  (PHENERGAN ) 12.5 MG tablet, TAKE 1 TABLET(12.5 MG) BY MOUTH EVERY 6 HOURS AS NEEDED FOR NAUSEA OR VOMITING, Disp: 90 tablet, Rfl: 3   propranolol  (INDERAL ) 20 MG tablet, Take 1 tablet (20 mg total) by mouth 2 (two) times daily., Disp: 180 tablet, Rfl: 1   rOPINIRole  (REQUIP ) 1 MG tablet, Take 1 tablet (1 mg total) by mouth at bedtime., Disp: 30 tablet, Rfl: 3   terconazole  (TERAZOL 7 ) 0.4 % vaginal cream, Apply topically twice daily for up  to 7 days, Disp: 45 g, Rfl: 2  Allergies:  Allergies  Allergen Reactions   Ativan [Lorazepam] Swelling and Other (See Comments)    Face & Throat Swelling  Note: tolerates midazolam  fine   Corticosteroids Other (See Comments)    Psychotic behavior    Penicillins Shortness Of Breath and Other (See Comments)    Irregular and rapid Heart Rate, too    Alprazolam Hives and Other (See Comments)    Hard to arouse, unresponsiveness also   Erythromycin Nausea And Vomiting        Gabapentin  Other (See Comments)  Made the patient feel depressed   Prednisolone Anxiety   Prednisone Anxiety and Other (See Comments)    Anxiety & Nervous Breakdown   Savella  [Milnacipran] Other (See Comments)    Reaction not noted    Past Medical History, Surgical history, Social history, and Family History were reviewed and updated.  Review of Systems: Review of Systems  Constitutional:  Positive for fatigue and unexpected weight change.  HENT:  Negative.    Eyes: Negative.   Respiratory: Negative.    Cardiovascular: Negative.   Gastrointestinal:  Positive for abdominal pain and diarrhea.  Endocrine: Negative.   Genitourinary:  Positive for dysuria.   Musculoskeletal:  Positive for back pain.  Skin: Negative.   Neurological: Negative.   Hematological: Negative.   Psychiatric/Behavioral:  Negative for depression.     Physical Exam: Vitals:   06/05/24 1228  BP: 139/88  Pulse: 63  Resp: 18  Temp: 97.9 F (36.6 C)  SpO2: 96%   Wt Readings from Last 3 Encounters:  06/05/24 236 lb (107 kg)  05/20/24 235 lb (106.6 kg)  05/08/24 245 lb 1.6 oz (111.2 kg)    Physical Exam Vitals reviewed.  HENT:     Head: Normocephalic and atraumatic.  Eyes:     Pupils: Pupils are equal, round, and reactive to light.  Cardiovascular:     Rate and Rhythm: Normal rate and regular rhythm.     Heart sounds: Normal heart sounds.  Pulmonary:     Effort: Pulmonary effort is normal.     Breath sounds:  Normal breath sounds.  Abdominal:     General: Bowel sounds are normal.     Palpations: Abdomen is soft.  Musculoskeletal:        General: No tenderness or deformity. Normal range of motion.     Cervical back: Normal range of motion.  Lymphadenopathy:     Cervical: No cervical adenopathy.  Skin:    General: Skin is warm and dry.     Findings: No erythema or rash.  Neurological:     Mental Status: She is alert and oriented to person, place, and time.  Psychiatric:        Behavior: Behavior normal.        Thought Content: Thought content normal.        Judgment: Judgment normal.    Lab Results  Component Value Date   WBC 8.0 06/05/2024   HGB 12.9 06/05/2024   HCT 39.9 06/05/2024   MCV 94.1 06/05/2024   PLT 121 (L) 06/05/2024     Chemistry      Component Value Date/Time   NA 138 05/08/2024 1210   NA 137 04/24/2023 1437   K 3.9 05/08/2024 1210   CL 101 05/08/2024 1210   CO2 29 05/08/2024 1210   BUN 21 05/08/2024 1210   BUN 18 04/24/2023 1437   CREATININE 1.08 (H) 05/08/2024 1210   CREATININE 0.96 01/19/2024 1415      Component Value Date/Time   CALCIUM  9.5 05/08/2024 1210   ALKPHOS 191 (H) 05/08/2024 1210   AST 44 (H) 05/08/2024 1210   ALT 30 05/08/2024 1210   BILITOT 0.4 05/08/2024 1210      Impression and Plan: Wendy Barber is a very charming 63 year-old white female. She has recurrent appendiceal cancer.  She underwent a HIPEC procedure in [place] a couple years ago. Unfortunately she had had chronic diarrhea since including C. Diff which has been successfully treatment.   Again, she has issues are very difficult  to try to fix.  I just wish that we had ways of trying to make it a lot better.  The chronic diarrhea just had really been a problem for.  I hate that her back on her.  I am not sure what the Neurosurgeon can do.  Her diabetes is also a real problem.  I really do not think that she is a IV fluid today.  We will continue her on the  Somatuline.  We will plan to get her back in another month, or sooner if necessary.   Maude JONELLE Crease, MD 8/13/202512:45 PM

## 2024-06-05 NOTE — Patient Instructions (Signed)
 Lanreotide Injection What is this medication? LANREOTIDE (lan REE oh tide) treats high levels of growth hormone (acromegaly). It is used when other therapies have not worked well enough or cannot be tolerated. It works by reducing the amount of growth hormone your body makes. This reduces symptoms and the risk of health problems caused by too much growth hormone, such as diabetes and heart disease. It may also be used to treat neuroendocrine tumors, a cancer of the cells that release hormones and other substances in your body. It works by slowing down the release of these substances from the cells. This slows tumor growth. It also decreases the symptoms of carcinoid syndrome, such as flushing or diarrhea. This medicine may be used for other purposes; ask your health care provider or pharmacist if you have questions. COMMON BRAND NAME(S): Somatuline [place] What should I tell my care team before I take this medication? They need to know if you have any of these conditions: Diabetes Gallbladder disease Heart disease Kidney disease Liver disease Pancreatic disease Thyroid disease An unusual or allergic reaction to lanreotide, other medications, foods, dyes, or preservatives Pregnant or trying to get pregnant Breastfeeding How should I use this medication? This medication is injected under the skin. It is given by your care team in a hospital or clinic setting. Talk to your care team about the use of this medication in children. Special care may be needed. Overdosage: If you think you have taken too much of this medicine contact a poison control center or emergency room at once. NOTE: This medicine is only for you. Do not share this medicine with others. What if I miss a dose? Keep appointments for follow-up doses. It is important not to miss your dose. Call your care team if you are unable to keep an appointment. What may interact with this medication? Bromocriptine Cyclosporine Certain  medications for blood pressure, heart disease, irregular heartbeat Certain medications for diabetes Quinidine Terfenadine This list may not describe all possible interactions. Give your health care provider a list of all the medicines, herbs, non-prescription drugs, or dietary supplements you use. Also tell them if you smoke, drink alcohol, or use illegal drugs. Some items may interact with your medicine. What should I watch for while using this medication? Visit your care team for regular checks on your progress. Tell your care team if your symptoms do not start to get better or if they get worse. Your condition will be monitored carefully while you are receiving this medication. You may need blood work while you are taking this medication. This medication may increase blood sugar. The risk may be higher in patients who already have diabetes. Ask your care team what you can do to lower your risk of diabetes while taking this medication. Talk to your care team if you wish to become pregnant or think you may be pregnant. This medication can cause serious birth defects. Do not breast-feed while taking this medication and for 6 months after stopping therapy. This medication may cause infertility. Talk to your care team if you are concerned about your fertility. What side effects may I notice from receiving this medication? Side effects that you should report to your care team as soon as possible: Allergic reactions--skin rash, itching, hives, swelling of the face, lips, tongue, or throat Gallbladder problems--severe stomach pain, nausea, vomiting, fever High blood sugar (hyperglycemia)--increased thirst or amount of urine, unusual weakness or fatigue, blurry vision Increase in blood pressure Low blood sugar (hypoglycemia)--pale, blue or purple  skin or lips, sweating, fussiness, rapid heartbeat, poor feeding, low body temperature Low thyroid levels (hypothyroidism)--unusual weakness or fatigue,  increased sensitivity to cold, constipation, hair loss, dry skin, weight gain, feelings of depression Oily or light-colored stools, diarrhea, bloating, weight loss Slow heartbeat--dizziness, feeling faint or lightheaded, confusion, trouble breathing, unusual weakness or fatigue Side effects that usually do not require medical attention (report these to your care team if they continue or are bothersome): Diarrhea Dizziness Headache Muscle spasms Nausea Pain, redness, or irritation at injection site Stomach pain This list may not describe all possible side effects. Call your doctor for medical advice about side effects. You may report side effects to FDA at 1-800-FDA-1088. Where should I keep my medication? This medication is given in a hospital or clinic. It will not be stored at home. NOTE: This sheet is a summary. It may not cover all possible information. If you have questions about this medicine, talk to your doctor, pharmacist, or health care provider.  2024 Elsevier/Gold Standard (2023-09-22 00:00:00)

## 2024-06-06 ENCOUNTER — Encounter: Payer: Self-pay | Admitting: Hematology & Oncology

## 2024-06-06 ENCOUNTER — Encounter: Payer: Self-pay | Admitting: *Deleted

## 2024-06-06 ENCOUNTER — Other Ambulatory Visit: Payer: Self-pay | Admitting: Family Medicine

## 2024-06-06 DIAGNOSIS — I10 Essential (primary) hypertension: Secondary | ICD-10-CM

## 2024-06-06 LAB — CHROMOGRANIN A: Chromogranin A (ng/mL): 34 ng/mL (ref 0.0–101.8)

## 2024-06-06 NOTE — Addendum Note (Signed)
 Addended by: BRIDGETT LEOTIS CROME on: 06/06/2024 04:10 PM   Modules accepted: Orders

## 2024-06-11 ENCOUNTER — Inpatient Hospital Stay

## 2024-06-11 VITALS — BP 150/88 | HR 76 | Resp 18

## 2024-06-11 DIAGNOSIS — C181 Malignant neoplasm of appendix: Secondary | ICD-10-CM | POA: Diagnosis not present

## 2024-06-11 DIAGNOSIS — D510 Vitamin B12 deficiency anemia due to intrinsic factor deficiency: Secondary | ICD-10-CM

## 2024-06-11 MED ORDER — SODIUM CHLORIDE 0.9 % IV SOLN: Freq: Once | INTRAVENOUS | Status: AC

## 2024-06-11 MED ORDER — SODIUM CHLORIDE 0.9 % IV SOLN
1000.0000 mg | Freq: Once | INTRAVENOUS | Status: AC
  Administered 2024-06-11: 1000 mg via INTRAVENOUS
  Filled 2024-06-11: qty 10

## 2024-06-14 ENCOUNTER — Other Ambulatory Visit: Payer: Self-pay | Admitting: Behavioral Health

## 2024-06-14 DIAGNOSIS — F411 Generalized anxiety disorder: Secondary | ICD-10-CM

## 2024-06-14 DIAGNOSIS — F331 Major depressive disorder, recurrent, moderate: Secondary | ICD-10-CM

## 2024-06-22 ENCOUNTER — Other Ambulatory Visit: Payer: Self-pay | Admitting: Hematology & Oncology

## 2024-06-22 DIAGNOSIS — H8112 Benign paroxysmal vertigo, left ear: Secondary | ICD-10-CM

## 2024-06-25 ENCOUNTER — Other Ambulatory Visit: Payer: Self-pay | Admitting: Family Medicine

## 2024-06-25 DIAGNOSIS — M545 Low back pain, unspecified: Secondary | ICD-10-CM

## 2024-06-26 NOTE — Telephone Encounter (Signed)
 Is it okay to refill? Patient was last seen in July 2025 for left side back pain and medication was prescribed.

## 2024-06-26 NOTE — Progress Notes (Addendum)
 [facility] - Clinic Note  07/08/2024   CHIEF COMPLAINT Patient presents for Retina Follow Up  HISTORY OF PRESENT ILLNESS: Wendy Barber is a 63 y.o. female who presents to the clinic today for:  HPI     Retina Follow Up   In both eyes.  This started 1 year ago.  Duration of 1 year.  Since onset it is stable.  I, the attending physician,  performed the HPI with the patient and updated documentation appropriately.        Comments   1 year retina follow up DM exam pt is reporting no vision changes noticed she had cataract surgery with Groat in Oct 2024 OS Jan 25 OS pt is having dryness she's using At's bid ou last reading 9.7 A1C and last blood sugar 345 in office       Last edited by Valdemar Rogue, MD on 07/15/2024  2:05 AM.     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 Her spleen, appendix, small intestine, part of colon and female organs have been removed. Now she has a tumor on her liver. She is getting ready to have a Colostomy performed.   Referring physician: [name], MD [phone] [place],  [place] [address]  HISTORICAL INFORMATION:  Selected notes from the MEDICAL RECORD NUMBER Referred by Dr. Levora (PCP) for diabetic eye exam LEE:  Ocular Hx- PMH-   CURRENT MEDICATIONS: No current outpatient medications on file. (Ophthalmic Drugs)   No current facility-administered medications for this visit. (Ophthalmic Drugs)   Current Outpatient Medications (Other)  Medication Sig   acetaminophen  (TYLENOL ) 500 MG tablet Take 1,000 mg by mouth every 6 (six) hours as needed for moderate pain (pain score 4-6).   amLODipine  (NORVASC ) 5 MG tablet TAKE 1 TABLET(5 MG) BY MOUTH DAILY   ciprofloxacin  (CIPRO ) 500 MG tablet Take 1 tablet (500 mg total) by mouth 2 (two) times daily.   Continuous Glucose Sensor (DEXCOM G7 SENSOR) MISC 1 Device by Does not apply route as directed.   cyclobenzaprine  (FLEXERIL ) 10 MG tablet TAKE 1/2 TO 1  TABLET(5 TO 10 MG) BY MOUTH THREE TIMES DAILY AS NEEDED FOR MUSCLE SPASMS. START EVERY NIGHT AT BEDTIME AS NEEDED DUE TO SEDATION   dicyclomine  (BENTYL ) 20 MG tablet TAKE 1 TABLET(20 MG) BY MOUTH THREE TIMES DAILY BEFORE MEALS   diphenoxylate -atropine  (LOMOTIL ) 2.5-0.025 MG tablet TAKE 2 TABLETS BY MOUTH FOUR TIMES DAILY AS NEEDED FOR LOOSE STOOLS OR DIARRHEA   famotidine  (PEPCID ) 20 MG tablet TAKE 1 TABLET(20 MG) BY MOUTH TWICE DAILY   fondaparinux  (ARIXTRA ) 10 MG/0.8ML SOLN injection Inject 0.8 mLs (10 mg total) into the skin daily.   glipiZIDE  (GLUCOTROL ) 5 MG tablet Take 1 tablet (5 mg total) by mouth 2 (two) times daily before a meal.   hydrocortisone  (ANUSOL -HC) 2.5 % rectal cream PLACE RECTALLY 4 TIMES DAILY AS NEEDED FOR HEMORRHOIDS OR ANAL ITCHING   HYDROmorphone  (DILAUDID ) 4 MG tablet Take 1 tablet (4 mg total) by mouth every 6 (six) hours as needed for severe pain (pain score 7-10).   insulin  glargine (LANTUS  SOLOSTAR) 100 UNIT/ML Solostar Pen Inject 30 Units into the skin daily.   Insulin  Pen Needle 32G X 4 MM MISC 1 Device by Does not apply route daily in the afternoon.   lidocaine -hydrocortisone  (ANAMANTLE) 3-1 % KIT Place 1 Application rectally 2 (two) times daily.   loperamide  (IMODIUM ) 2 MG capsule Take 2 capsules (4 mg total) by  mouth 4 (four) times daily as needed for diarrhea or loose stools.   meclizine  (ANTIVERT ) 25 MG tablet TAKE 1 TABLET(25 MG) BY MOUTH THREE TIMES DAILY AS NEEDED FOR DIZZINESS   ondansetron  (ZOFRAN ) 8 MG tablet TAKE 1 TABLET(8 MG) BY MOUTH EVERY 8 HOURS AS NEEDED FOR NAUSEA OR VOMITING   orphenadrine  (NORFLEX ) 100 MG tablet TAKE 1 TABLET(100 MG) BY MOUTH AT BEDTIME AS NEEDED FOR MUSCLE SPASMS   pantoprazole  (PROTONIX ) 40 MG tablet TAKE 1 TABLET BY MOUTH EVERY DAY BEFORE BREAKFAST   pramoxine-hydrocortisone  (PROCTOCREAM-HC) 1-1 % rectal cream APPLY RECTALLY TO THE AFFECTED AREA TWICE DAILY   promethazine  (PHENERGAN ) 12.5 MG tablet TAKE 1 TABLET(12.5 MG) BY  MOUTH EVERY 6 HOURS AS NEEDED FOR NAUSEA OR VOMITING   terconazole  (TERAZOL 7 ) 0.4 % vaginal cream Apply topically twice daily for up to 7 days   busPIRone  (BUSPAR ) 15 MG tablet Take 1 tablet (15 mg total) by mouth 3 (three) times daily.   citalopram  (CELEXA ) 40 MG tablet Take 1 tablet (40 mg total) by mouth daily.   diazepam  (VALIUM ) 10 MG tablet Take one pill as needed for procedures including radiation simulation scan, MRI for radiation planning, and radiation treatment   eszopiclone  (LUNESTA ) 2 MG TABS tablet TAKE 1 TABLET(2 MG) BY MOUTH AT BEDTIME AS NEEDED FOR SLEEP   mirtazapine  (REMERON ) 15 MG tablet Take 1 tablet (15 mg total) by mouth at bedtime.   propranolol  (INDERAL ) 20 MG tablet Take 1 tablet (20 mg total) by mouth 2 (two) times daily.   No current facility-administered medications for this visit. (Other)   REVIEW OF SYSTEMS: ROS   Positive for: Neurological, Endocrine, Eyes Last edited by Resa Delon ORN, COT on 07/08/2024  1:25 PM.      ALLERGIES Allergies  Allergen Reactions   Ativan [Lorazepam] Swelling and Other (See Comments)    Face & Throat Swelling  Note: tolerates midazolam  fine   Corticosteroids Other (See Comments)    Psychotic behavior    Penicillins Shortness Of Breath and Other (See Comments)    Irregular and rapid Heart Rate, too    Alprazolam Hives and Other (See Comments)    Hard to arouse, unresponsiveness also   Erythromycin Nausea And Vomiting        Gabapentin  Other (See Comments)    Made the patient feel depressed   Prednisolone Anxiety   Prednisone Anxiety and Other (See Comments)    Anxiety & Nervous Breakdown   Savella  [Milnacipran] Other (See Comments)    Reaction not noted   PAST MEDICAL HISTORY Past Medical History:  Diagnosis Date   Arthritis    Autoimmune hepatitis (HCC)    Back pain    Cancer (HCC)    pseudomyxoma peritonei, liver   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    Cardiomegaly 12/08/2022   without congestive failure, noted on ECHO   Cerebral meningioma (HCC)    Chronic diarrhea    Chronic fatigue syndrome    Diabetes mellitus without complication (HCC)    DVT of deep femoral vein, left (HCC) 03/19/2020   Fibromyalgia    GERD (gastroesophageal reflux disease)    Goals of care, counseling/discussion 03/19/2020   History of blood transfusion    Hypertension    Incomplete right bundle branch block (RBBB) 12/08/2023   Noted on EKG   Iron deficiency anemia due to chronic blood loss 05/14/2021   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   Migraines    Multiple thyroid   nodules    Pernicious anemia 05/14/2021   Pituitary adenoma (HCC)    PONV (postoperative nausea and vomiting)    not with recent surgeries   Presence of IVC filter 03/19/2020   Pulmonary embolism, bilateral (HCC) 03/19/2020   Rectal bleed 12/29/2023   Short bowel syndrome, unspecified    Vertigo    Past Surgical History:  Procedure Laterality Date   ABDOMINAL SURGERY  2005   cytoreductive surgery with splenectomy, [facility]   ABDOMINAL SURGERY  2022   cytoreductive surgery with splenectomy, [facility]   ACHILLES TENDON REPAIR Bilateral    ARTHROPLASTY Bilateral    Shoulder   BIOPSY THYROID      CARPAL TUNNEL RELEASE Bilateral    possibly right x2   CHOLECYSTECTOMY  07/1994   CYSTOSCOPY/URETEROSCOPY/HOLMIUM LASER/STENT PLACEMENT Left 03/26/2024   Procedure: CYSTOSCOPY/URETEROSCOPY/HOLMIUM LASER/STENT PLACEMENT;  Surgeon: Elisabeth Valli BIRCH, MD;  Location: [facility];  Service: Urology;  Laterality: Left;  CYSTOSCOPY/LEFT URETEROSCOPY/HOLMIUM LASER/STENT PLACEMENT/RETROGRADE PYELOGRAM   IR CATHETER TUBE CHANGE  02/02/2021   IR CATHETER TUBE CHANGE  02/25/2021   IR IMAGING GUIDED PORT INSERTION  06/20/2022   IR RADIOLOGIST EVAL & MGMT  02/24/2021   IR RADIOLOGIST EVAL & MGMT  03/10/2021   IR RADIOLOGIST EVAL & MGMT  06/22/2022   IR THROMBECT VENO MECH MOD SED  06/20/2022   IR US   GUIDE BX ASP/DRAIN  11/25/2019   IR US  GUIDE VASC ACCESS LEFT  06/20/2022   IR US  GUIDE VASC ACCESS RIGHT  06/20/2022   IR US  GUIDE VASC ACCESS RIGHT  06/20/2022   IR VENO/EXT/BI  06/20/2022   IR VENOCAVAGRAM IVC  06/20/2022   JOINT REPLACEMENT Bilateral    knees   KNEE ARTHROSCOPY     ORIF ANKLE FRACTURE Right 08/27/2019   Procedure: OPEN REDUCTION INTERNAL FIXATION RIGHT ANKLE FRACTURE;  Surgeon: Beverley Evalene BIRCH, MD;  Location: [facility];  Service: Orthopedics;  Laterality: Right;   perineorrophy     SPINAL FUSION     TARSAL TUNNEL RELEASE Right    TONGUE BIOPSY     TOTAL ABDOMINAL HYSTERECTOMY Bilateral 2004   with BSO and appendectomy   FAMILY HISTORY Family History  Problem Relation Age of Onset   Depression Mother    Drug abuse Sister    Suicidality Sister    Alcohol abuse Brother    Drug abuse Brother    SOCIAL HISTORY Social History   Tobacco Use   Smoking status: Never   Smokeless tobacco: Never  Vaping Use   Vaping status: Never Used  Substance Use Topics   Alcohol use: No   Drug use: Never       OPHTHALMIC EXAM:  Base Eye Exam     Visual Acuity (Snellen - Linear)       Right Left   Dist [place] 20/30 -2 20/30 -1   Dist ph  20/20 -2 20/20    Correction: Glasses         Tonometry (Tonopen, 1:35 PM)       Right Left   Pressure 10 12         Pupils       Dark Light Shape React APD   Right 3 2 Round Brisk None   Left 3 2 Round Brisk None         Visual Fields       Left Right    Full Full         Extraocular Movement       Right Left  Full, Ortho Full, Ortho         Neuro/Psych     Oriented x3: Yes   Mood/Affect: Normal         Dilation     Both eyes: 2.5% Phenylephrine  @ 1:35 PM           Slit Lamp and Fundus Exam     Slit Lamp Exam       Right Left   Lids/Lashes Dermatochalasis - upper lid Dermatochalasis - upper lid   Conjunctiva/Sclera White and quiet White and quiet   Cornea Scattered sub epi  scars and haze greatest superior, Well healed  cataract wound 2 sub epi scars and haze superiorly, trace PEE, Well healed  cataract wound   Anterior Chamber deep and clear deep and clear   Iris Round and dilated, No NVI Round and dilated, No NVI   Lens PC IOL in good postition, trace Posterior capsular opacification PC IOL in good postition, 1+ Posterior capsular opacification   Anterior Vitreous Vitreous syneresis mild syneresis         Fundus Exam       Right Left   Disc Pink and Sharp, Compact, temporal PPA, no pallor or edema Pink and Sharp, Compact, temporal PPA, no pallor or edema   C/D Ratio 0.3 0.3   Macula Flat, Blunted foveal reflex, RPE mottling, No heme or edema Flat, good foveal reflex, RPE mottling, No heme or edema   Vessels mild attenuation, mild tortuosity mild attenuation, mild tortuosity   Periphery Attached, mild peripheral cystoid degeneration inferiorly Attached, mild peripheral cystoid degeneration inferiorly           Refraction     Wearing Rx       Sphere Cylinder   Right -+2.00 Sphere   Left +2.00 Sphere           IMAGING AND PROCEDURES  Imaging and Procedures for 07/08/2024  OCT, Retina - OU - Both Eyes       Right Eye Quality was good. Central Foveal Thickness: 243. Progression has no prior data. Findings include normal foveal contour, no IRF, no SRF, vitreomacular adhesion (No DME).   Left Eye Quality was good. Central Foveal Thickness: 238. Progression has no prior data. Findings include normal foveal contour, no IRF, no SRF, vitreomacular adhesion .   Notes *Images captured and stored on drive  Diagnosis / Impression:  NFP, no IRF/SRF OU No DME  Clinical management:  See below  Abbreviations: NFP - Normal foveal profile. CME - cystoid macular edema. PED - pigment epithelial detachment. IRF - intraretinal fluid. SRF - subretinal fluid. EZ - ellipsoid zone. ERM - epiretinal membrane. ORA - outer retinal atrophy. ORT - outer retinal  tubulation. SRHM - subretinal hyper-reflective material. IRHM - intraretinal hyper-reflective material            ASSESSMENT/PLAN:   ICD-10-CM   1. Diabetes mellitus type 2 without retinopathy (HCC)  E11.9 OCT, Retina - OU - Both Eyes    2. Current use of insulin  (HCC)  Z79.4     3. Long term (current) use of oral hypoglycemic drugs  Z79.84     4. Decreased vision in both eyes  H54.3     5. Essential hypertension  I10     6. Hypertensive retinopathy of both eyes  H35.033     7. Pseudophakia  Z96.1     8. Dry eyes, bilateral  H04.123       1-3. Diabetes mellitus, type 2 without retinopathy  -  A1c: 9.3 on 07.28.25 - The incidence, risk factors for progression, natural history and treatment options for diabetic retinopathy  were discussed with patient.   - The need for close monitoring of blood glucose, blood pressure, and serum lipids, avoiding cigarette or any type of tobacco, and the need for long term follow up was also discussed with patient. - f/u in 1 year, sooner prn  4. 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 Hypertensive retinopathy OU - discussed importance of tight BP control - monitor  7. Pseudophakia OU w/ PCO OS>OD  - s/p CE/IOL (10.2024, 01.2025) w/ Dr. Glendia Gaudy  - IOL in good position, doing well  - monitor   8. Dry eyes OU - recommend artificial tears and lubricating  ointment as needed   Ophthalmic Meds Ordered this visit:  No orders of the defined types were placed in this encounter.    Return in about 1 year (around 07/08/2025) for f/u Diabetes, DFE, OCT.  There are no Patient Instructions on file for this visit.  Explained the diagnoses, plan, and follow up with the patient and they expressed understanding.  Patient expressed understanding of the importance of proper follow up care.   This document serves as a record of services personally performed by Redell JUDITHANN Hans, MD, PhD. It was created on their behalf by Avelina Pereyra, COA an ophthalmic technician. The creation of this record is the provider's dictation and/or activities during the visit.   Electronically signed by: Avelina GORMAN Pereyra, COT  07/15/24  2:26 AM   This document serves as a record of services personally performed by Redell JUDITHANN Hans, MD, PhD. It was created on their behalf by Wanda GEANNIE Keens, COT an ophthalmic technician. The creation of this record is the provider's dictation and/or activities during the visit.    Electronically signed by:  Wanda GEANNIE Keens, COT  07/15/24 2:26 AM  Redell JUDITHANN Hans, M.D., Ph.D. Diseases & Surgery of the Retina and Vitreous [facility] 07/08/2024  I have reviewed the above documentation for accuracy and completeness, and I agree with the above. Redell JUDITHANN Hans, M.D., Ph.D. 07/15/24 2:26 AM   Abbreviations: M myopia (nearsighted); A astigmatism; H hyperopia (farsighted); P presbyopia; Mrx spectacle prescription;  CTL contact lenses; OD right eye; OS left eye; OU both eyes  XT exotropia; ET esotropia; PEK punctate epithelial keratitis; PEE punctate epithelial erosions; DES dry eye syndrome; MGD meibomian gland dysfunction; ATs artificial tears; PFAT's preservative free artificial tears; NSC nuclear sclerotic cataract; PSC posterior subcapsular cataract; ERM epi-retinal membrane; PVD posterior vitreous detachment; RD retinal  detachment; DM diabetes mellitus; DR diabetic retinopathy; NPDR non-proliferative diabetic retinopathy; PDR proliferative diabetic retinopathy; CSME clinically significant macular edema; DME diabetic macular edema; dbh dot blot hemorrhages; CWS cotton wool spot; POAG primary open angle glaucoma; C/D cup-to-disc ratio; HVF humphrey visual field; GVF goldmann visual field; OCT optical coherence tomography; IOP intraocular pressure; BRVO Branch retinal vein occlusion;  CRVO central retinal vein occlusion; CRAO central retinal artery occlusion; BRAO branch retinal artery occlusion; RT retinal tear; SB scleral buckle; PPV pars plana vitrectomy; VH Vitreous hemorrhage; PRP panretinal laser photocoagulation; IVK intravitreal kenalog; VMT vitreomacular traction; MH Macular hole;  NVD neovascularization of the disc; NVE neovascularization elsewhere; AREDS age related eye disease study; ARMD age related macular degeneration; POAG primary open angle glaucoma; EBMD epithelial/anterior basement membrane dystrophy; ACIOL anterior chamber intraocular lens; IOL intraocular lens; PCIOL posterior chamber intraocular lens; Phaco/IOL phacoemulsification with intraocular lens placement; PRK photorefractive keratectomy; LASIK laser assisted in situ keratomileusis; HTN hypertension; DM diabetes mellitus; COPD chronic obstructive pulmonary disease

## 2024-07-01 ENCOUNTER — Other Ambulatory Visit: Payer: Self-pay

## 2024-07-02 ENCOUNTER — Other Ambulatory Visit: Payer: Self-pay | Admitting: Family Medicine

## 2024-07-02 ENCOUNTER — Other Ambulatory Visit: Payer: Self-pay

## 2024-07-02 DIAGNOSIS — R12 Heartburn: Secondary | ICD-10-CM

## 2024-07-02 NOTE — Progress Notes (Signed)
 SURGICAL ONCOLOGY CLINIC VISIT   CC: Appendiceal cancer.  HISTORY OF THE PRESENT ILLNESS:  Wendy [place] is a 63 y.o. 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 She is a long time patient of Dr. Carolan in [place]    The patient's history from Dr. Carolan dates back to 2005 when she was taken to the operating room on January 06, 2004 for CRS/HIPEC [facility]). Major resections at the time of surgery included a LAR with a splenectomy, resection of a large pelvic tumor, omentectomy, resection of capsule of the right lobe of the liver and some over the left lobe of the liver. Partial vaginectomy, portal dissection, bilateral parietal peritonectomies, resection of a colonic tumor x 2 with primary closure, resection of small bowel tumors followed by hyperthermic intraperitoneal chemotherapy with mitomycin-C for 90 minutes. Of note, her initial PCI scores were 32 before resection and 4 afterwards, CC score=1. The patient was followed and did well for some time but was not as fastidious in her later followup.    She was seen in 2019 by Dr. Carolan, at which time she described occasional abdominal discomfort. This prompted a  subsequent CT scan whih was negative for any radiographic evidence of tumor recurrence. In June, 2021 in [place]  where she now lives, she went to an emergency room complaining of back pain. Workup included a new CT scan of the chest, abdomen and pelvis showing low-attenuating mass suspicious for local recurrence. Followup CT scan from September, 2021 was read as showing a tubular low-attenuating mass in the right lower quadrant, suspicious for appendiceal cancer recurrence. A biopsy of the new pelvic mass was positive for abundant mucin.   CT scan from 10/28/20 showed sign of serosal and peritoneal disease, but no evidence of metastatic disease in the chest. Tumor markers from 12/09/20 CA 19-9 26.6, CA 125 2, CEA 0.9   Dr. Carolan  recommended to undergo  a CRS/HIPEC at that time. On 12/10/2020, the patient was taken to the operating room for an uncomplicated repeat cytoreductive surgery with hyperthermic intraperitoneal chemotherapy. Initial post-resectiuon PCI scores were 22 and 0. Major resections included distal small bowel with ascending colon and a separate segment of distal jejunum, partial cystectomy. On 12/14/2020, the patient was taken back to the operating room for exploratory laparotomy with resection of ileocolonic anastomosis which had leaked. Her midline incision was left open but a wound VAC was placed and this was changed every couple of days until 12/23/2020 when Dr. Estrella returned with her to the OR for wound washout and primary closure using retention sutures.   She lives in [place] , and is followed by Dr. Maude Crease at [name].Unfortunately she had had chronic diarrhea since including C. Diff which has been successfully treatment.   For right now, she is seems to be doing okay with respect to the carcinoid. She has been having issues with large volume loose to liquid stools about 10-20 times a day. It is significantly impairing her life, she doesn't leave her home and doesn't move far from her bed. She will get worse diarrhea after raw vegetables. She is taking maximum imodium  and lomotil . She complains of cramping.  She is having some abdominal tenderness. She will continue on the Somatuline.   07/03/24: currently, she complains of 2 month of diarrhea. She is taking immodium 8/day, lamotil 8/day, bentyl  4/day, cholestyramine. She is also taking octreotide monthly. She reports not being able to leave the house due to the  severity of her diarrhea and incontinence.    Allergies  Allergen Reactions  . Codeine Palpitations and Shortness Of Breath  . Gabapentin  Other (See Comments)    Made the patient feel depressed  . Alprazolam Hives  . Erythromycin Base Hives  . Penicillin Hives  . Prednisone   . Savella [Milnacipran]                 Past Medical History:  Diagnosis Date  . Arthritis   . Chronic deep vein thrombosis (DVT)    (CMD)   . Chronic fatigue   . Fatty liver   . Kidney stone   . Non-alcoholic cirrhosis    (CMD)   . Short bowel syndrome   . UTI (urinary tract infection)      History reviewed. No pertinent surgical history.    Current Outpatient Medications:  .  acetaminophen  (TYLENOL ) 500 mg tablet, Take 1,000 mg by mouth., Disp: , Rfl:  .  amLODIPine -valsartan -hydroCHLOROthiazide  10-320-25 mg tab, Take 1 tablet by mouth daily., Disp: , Rfl:  .  busPIRone  (BUSPAR ) 15 mg tablet, Take 15 mg by mouth., Disp: , Rfl:  .  citalopram  (CeleXA ) 40 mg tablet, Take 40 mg by mouth daily., Disp: , Rfl:  .  cyclobenzaprine  (FLEXERIL ) 10 mg tablet, Take 5-10 mg by mouth., Disp: , Rfl:  .  Dexcom G7 Sensor, APPLY 1 SENSOR EVERY 10 DAYS DAYS AS DIRECTED, Disp: , Rfl:  .  dicyclomine  (Bentyl ) 20 mg tab, TAKE 1 TABLET(20 MG) BY MOUTH THREE TIMES DAILY BEFORE MEALS, Disp: , Rfl:  .  diphenhydrAMINE  (BENADRYL ) 25 mg tablet, Take by mouth., Disp: , Rfl:  .  diphenoxylate -atropine  (LOMOTIL ) 2.5-0.025 mg per tablet, TAKE 2 TABLETS BY MOUTH FOUR TIMES DAILY AS NEEDED FOR DIARRHEA OR LOOSE STOOLS, Disp: , Rfl:  .  estradioL (ESTRACE) 0.01 % (0.1 mg/gram) vaginal cream, USE 1 GRAM VAGINALLY TO THE SKIN EVERY 2 WEEKS, Disp: , Rfl:  .  eszopiclone  (LUNESTA ) 2 mg tablet, TAKE 1 TABLET(2 MG) BY MOUTH AT BEDTIME AS NEEDED FOR SLEEP, Disp: , Rfl:  .  famotidine  (PEPCID ) 20 mg tablet, Take 20 mg by mouth., Disp: , Rfl:  .  fondaparinux  (ARIXTRA ) 10 mg/0.8 mL syrg, Inject 10 mg under the skin., Disp: , Rfl:  .  glipiZIDE  (GLUCOTROL ) 5 mg tablet, Take 5 mg by mouth., Disp: , Rfl:  .  hydrocortisone  (ANUSOL -HC) 2.5 % rectal cream, PLACE RECTALLY 4 TIMES DAILY AS NEEDED FOR HEMORRHOIDS OR ANAL ITCHING, Disp: , Rfl:  .  HYDROmorphone  (DILAUDID ) 4 mg tablet, Take 4 mg by mouth., Disp: , Rfl:  .  Lantus  Solostar U-100 Insulin  100  unit/mL (3 mL) pen, INJECT 20 UNITS INTO THE SKIN DAILY, Disp: , Rfl:  .  lidocaine /hydrocortisone  ac (lidocaine  HCl-hydrocortison ac) 3-1 % (7 gram) kit, Insert 1 Application into the rectum., Disp: , Rfl:  .  loperamide  (IMODIUM ) 2 mg capsule, TAKE 2 CAPSULES BY MOUTH IN THE MORNING, 2 AT NOON AND 2 IN THE EVENING AND 2 AT BEDTIME, Disp: , Rfl:  .  meclizine  (ANTIVERT ) 25 mg tablet, TAKE 1 TABLET(25 MG) BY MOUTH THREE TIMES DAILY AS NEEDED FOR DIZZINESS, Disp: , Rfl:  .  mirtazapine  (REMERON ) 7.5 mg tablet, Take 7.5 mg by mouth., Disp: , Rfl:  .  Nano 2nd Gen Pen Needle 32 gauge x 5/32 ndle, USE AS DIRECTED TO INJECT EVERY AFTERNOON, Disp: , Rfl:  .  nystatin -triamcinolone  (MYCOLOG II) 100,000-0.1 unit/gram-% ointment, Apply 1 Application topically., Disp: ,  Rfl:  .  omeprazole (PriLOSEC) 40 mg DR capsule, 1 capsule 30 minutes before morning meal Orally Once a day; Duration: 30 day(s), Disp: , Rfl:  .  ondansetron  (ZOFRAN ) 8 mg tablet, , Disp: , Rfl:  .  orphenadrine  (NORFLEX ) 100 mg tablet, TAKE 1 TABLET(100 MG) BY MOUTH AT BEDTIME AS NEEDED FOR MUSCLE SPASMS, Disp: , Rfl:  .  pantoprazole  (PROTONIX ) 40 mg EC tablet, Take 1 tablet by mouth daily before breakfast., Disp: , Rfl:  .  promethazine  (PHENERGAN ) 12.5 mg tablet, TAKE 1 TABLET(12.5 MG) BY MOUTH EVERY 6 HOURS AS NEEDED FOR NAUSEA OR VOMITING, Disp: , Rfl:  .  propranoloL  (INDERAL ) 20 mg tablet, Take 1 tablet by mouth 2 (two) times a day., Disp: , Rfl:  .  terconazole  (TERAZOL 7 ) 0.4 % crea vaginal cream, Apply topically twice daily for up to 7 days, Disp: , Rfl:  .  albuterol  HFA (PROVENTIL  HFA;VENTOLIN  HFA;PROAIR  HFA) 90 mcg/actuation inhaler, Inhale 1 puff every 4 (four) hours., Disp: 1 Inhaler, Rfl: 0 .  aspirin  (Bayer Low Dose Aspirin ) 81 mg EC tablet, Take 1 tablet by mouth daily., Disp: , Rfl:  .  buprenorphine (BUTRANS) 15 mcg/hour ptwk patch, , Disp: , Rfl:  .  buPROPion  (WELLBUTRIN  XL) 300 mg 24 hr tablet, Take 300 mg by mouth  daily., Disp: , Rfl:  .  cabergoline  (DOSTINEX ) 0.5 mg tablet, TAKE 1 TABLET(0.5 MG) BY MOUTH 2 TIMES A WEEK, Disp: , Rfl:  .  dextromethorphan -bupropion  (Auvelity ) 45-105 mg TbIE, Take one tablet by mouth for 3 days, then take two tablet daily 7-8 hours between doses, Disp: , Rfl:  .  glimepiride  (AMARYL ) 4 mg tablet, Take 4 mg by mouth., Disp: , Rfl:  .  HYDROcodone -chlorpheniramine (TUSSIONEX PENNKINETIC) 10-8 mg/5 mL ER suspension, TAKE 5 ML BY MOUTH EVERY 12 HOURS AS NEEDED FOR COUGH. START AT BEDTIME, Disp: , Rfl:  .  naltrexone-bupropion  (Contrave) 8-90 mg TbER, Take 2 tablets by mouth., Disp: , Rfl:  .  ondansetron  (ZOFRAN -ODT) 8 mg disintegrating tablet, Dissolve 8 mg on tongue every 8 (eight) hours as needed., Disp: , Rfl:  No current facility-administered medications for this visit.  Social History   Socioeconomic History  . Marital status: Married    Spouse name: Not on file  . Number of children: Not on file  . Years of education: Not on file  . Highest education level: Not on file  Occupational History  . Not on file  Tobacco Use  . Smoking status: Never  . Smokeless tobacco: Never  Substance and Sexual Activity  . Alcohol use: Never  . Drug use: Never  . Sexual activity: Not on file  Other Topics Concern  . Not on file  Social History Narrative  . Not on file   Social Drivers of Health   Food Insecurity: No Food Insecurity (10/16/2023)   Received from [facility]   Food vital sign   . Within the past 12 months, you worried that your food would run out before you got money to buy more: Never true   . Within the past 12 months, the food you bought just didn't last and you didn't have money to get more: Never true  Transportation Needs: No Transportation Needs (10/16/2023)   Received from [facility] - Transportation   . Lack of Transportation (Medical): No   . Lack of Transportation (Non-Medical): No  Safety: Low Risk  (07/03/2024)   Safety   . How  often does anyone, including family and friends, physically hurt you?: Never   . How often does anyone, including family and friends, insult or talk down to you?: Never   . How often does anyone, including family and friends, threaten you with harm?: Never   . How often does anyone, including family and friends, scream or curse at you?: Never  Living Situation: Unknown (04/25/2024)   Received from [facility]   Living Situation   . Unable to Pay for Housing in the Last Year: Not on file   . Number of Times Moved in the Last Year: Not on file   . At any time in the past 12 months, were you homeless or living in a shelter (including now)?: No     Family History  Problem Relation Name Age of Onset  . Breast cancer Neg Hx       REVIEW OF SYSTEMS Pertinent items are noted in HPI.   PHYSICAL EXAMINATION: GENERAL APPEARANCE: well-groomed, healthy appearing 63 y.o. female, pleasant and in no apparent distress. VITAL SIGNS:  Vitals:   07/03/24 1122  BP: 148/81  Pulse:   Resp:   Temp:   SpO2:     NECK: Supple and symmetrical, trachea midline. LYMPH NODES: No cervical, axillary, or supraclavicular adenopathy.  LUNGS: Lungs clear. Breath sounds without rales, wheezes, or rhonchi.                            HEART: Rhythm consistent and regular. No murmurs, gallops, or rubs.  ABDOMEN: Soft, nontender. Midline surgical scar. Nontender. No erythema, drainage, or induration. EXTREMITES:  The patient is ambulatory.  The extremities are warm and without  edema.  NEUROLOGIC:  The patient is alert, oriented and converant, without gross motor deficits.  PERFORMANCE STATUS:  ECOG 0  Slides for consultation [phone] , 12/10/2020)   DISTAL SMALL BOWEL IMPLANTS, EXCISION (C1):              Acellular mucin present   ANTERIOR PELVIC PERITONEUM IMPLANTS, EXCISION (D1):              Low-grade mucinous carcinoma peritonei   MUCIN, RIGHT LOWER QUADRANT, EXCISION (E1):               Acellular mucin present   CAPSULE TUMOR RECURRENCE #2, EXCISION (F1):              Low-grade mucinous carcinoma peritonei   RIGHT UPPER QUADRANT IMPLANTS, EXCISION (G1):              Low-grade mucinous carcinoma peritonei   MID SMALL BOWEL IMPLANTS, EXCISION (H1):              Low-grade mucinous carcinoma peritonei   TRANSVERSE MESOCOLON IMPLANTS, EXCISION (J1):              Low-grade mucinous carcinoma peritonei   PROXIMAL SMALL BOWEL IMPLANTS, EXCISION (K1):              Low-grade mucinous carcinoma peritonei   PROXIMAL ILEUM IMPLANTS, EXCISION (M1):              Acellular mucin present   DISTAL SMALL BOWEL AND ASCENDING COLON, EXCISION (N11, N9):              Low-grade mucinous carcinoma peritonei involving the serosal aspect of the bowel   TUMOR OVER THE RIGHT ILIAC FOSSA OVER THE RIGHT FEMORAL NERVE, EXCISION (O1):  Low-grade mucinous carcinoma peritonei    PELVIC TUMOR AND PARTIAL CYSTECTOMY, EXCISION (P2):              Low-grade mucinous carcinoma peritonei    ASCENDING COLON IMPLANTS, EXCISION (Q1):              Minute focus of high-grade mucinous carcinoma peritonei    DISTAL JEJUNAL MESENTERY TUMOR , EXCISION (R1):              Low-grade mucinous carcinoma peritonei    DISTAL JEjUNUM, EXCISION (U3, U4):              Low-grade mucinous carcinoma peritonei involving the serosal aspect of the small bowel    Electronically signed by Collin Farr, MD on 01/01/2024     RADIOGRAPHIC IMAGING:   CT ABDOMEN PELVIS W CONTRAST, 07/03/2024 FINDINGS: Lower Chest: Subsegmental atelectasis. No suspicious pulmonary nodules. Central catheter tip terminates within the right atrium. Liver: Hepatic steatosis. Similar cirrhotic changes with nodular contour and confluent fibrosis. Gallbladder/Biliary: Cholecystectomy. Similar mild extra hepatic biliary ductal dilatation, likely secondary to postcholecystectomy state. Spleen: Splenectomy. Pancreas: Atrophic. Adrenals: No  nephrolithiasis. No hydronephrosis. . Kidneys: No nephrolithiasis. No hydronephrosis. Peritoneum/Mesenteries/Extraperitoneum: No free air. No free fluid or loculated drainable collection. No pathologically enlarged lymph nodes. . Gastrointestinal tract: No evidence of obstruction. Dense clumping of small bowel in the ventral mesentery suggestive of adhesive disease. Right hemicolectomy. Small bowel anastomosis in the ventral mid abdomen. Sigmoid colon anastomosis. Ureters: Unremarkable. Bladder: Underdistended. Reproductive System: Hysterectomy. Vascular: IVC filter place. Mild aortobiiliac atherosclerosis. No aortic aneurysm. Musculoskeletal: No acute displaced fractures. Polyarticular degenerative changes. L4-L5 PLIF. Levocurvature of the lumbar spine. Grade 1 anterolisthesis of L2 on L3. No aggressive focal bony lesions. Sequela of medication injection. Soft tissue thickening at the midline abdominal wall superior to the umbilicus. IMPRESSION:1.  No evidence of new or recurrent disease.2.  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 Mucinous lesion would be difficult to exclude.    ASSESSMENT AND PLAN:  W[place]y [place] is a 63 y.o. female with a  diagnosis of appendiceal cancer  since 2005, previously extensively treated by Dr. Sardi, who now lives in this area.  She had a HIPEC and CRS in 2005 and had a local recurrence and had a repeat HIPEC/CRS in 2022. She now presents with large volume, frequent diarrhea. She is on maximum imodium  and lomotil . She is on high dose somatostatin. She has an SMV thrombus and a history of substantial thrombotic disease.   She is having persistent diarrhea despite multiple anti-diarrheal medications including octreotide and previously tried tincture of opium .She is having poor QOL due to her chronic  diarrhea. We have discussed the option of colostomy creation to improve her QOL.  She would like to consider her options and make a decision later.  - RTC in 6 months with CT and labs - referral to dietician - may come back sooner if decide to have a colostomy creation  Edward A. Claryce, M.D.  Professor of Surgery  Chief, Surgical Oncology Service

## 2024-07-03 ENCOUNTER — Other Ambulatory Visit: Payer: Self-pay

## 2024-07-03 ENCOUNTER — Other Ambulatory Visit: Payer: Self-pay | Admitting: Radiation Therapy

## 2024-07-04 ENCOUNTER — Other Ambulatory Visit: Payer: Self-pay

## 2024-07-04 ENCOUNTER — Inpatient Hospital Stay (HOSPITAL_BASED_OUTPATIENT_CLINIC_OR_DEPARTMENT_OTHER): Admitting: Hematology & Oncology

## 2024-07-04 ENCOUNTER — Inpatient Hospital Stay: Attending: Hematology & Oncology

## 2024-07-04 ENCOUNTER — Encounter: Payer: Self-pay | Admitting: Hematology & Oncology

## 2024-07-04 ENCOUNTER — Inpatient Hospital Stay

## 2024-07-04 ENCOUNTER — Encounter: Payer: Self-pay | Admitting: Radiation Oncology

## 2024-07-04 ENCOUNTER — Encounter: Payer: Self-pay | Admitting: Internal Medicine

## 2024-07-04 VITALS — BP 141/96 | HR 76 | Temp 98.4°F | Resp 20 | Wt 239.8 lb

## 2024-07-04 DIAGNOSIS — I82401 Acute embolism and thrombosis of unspecified deep veins of right lower extremity: Secondary | ICD-10-CM | POA: Diagnosis not present

## 2024-07-04 DIAGNOSIS — M549 Dorsalgia, unspecified: Secondary | ICD-10-CM | POA: Diagnosis not present

## 2024-07-04 DIAGNOSIS — I2699 Other pulmonary embolism without acute cor pulmonale: Secondary | ICD-10-CM | POA: Insufficient documentation

## 2024-07-04 DIAGNOSIS — D510 Vitamin B12 deficiency anemia due to intrinsic factor deficiency: Secondary | ICD-10-CM | POA: Diagnosis present

## 2024-07-04 DIAGNOSIS — R109 Unspecified abdominal pain: Secondary | ICD-10-CM | POA: Diagnosis not present

## 2024-07-04 DIAGNOSIS — Z79899 Other long term (current) drug therapy: Secondary | ICD-10-CM | POA: Diagnosis not present

## 2024-07-04 DIAGNOSIS — E119 Type 2 diabetes mellitus without complications: Secondary | ICD-10-CM | POA: Insufficient documentation

## 2024-07-04 DIAGNOSIS — C772 Secondary and unspecified malignant neoplasm of intra-abdominal lymph nodes: Secondary | ICD-10-CM

## 2024-07-04 DIAGNOSIS — C181 Malignant neoplasm of appendix: Secondary | ICD-10-CM | POA: Insufficient documentation

## 2024-07-04 DIAGNOSIS — Z933 Colostomy status: Secondary | ICD-10-CM | POA: Insufficient documentation

## 2024-07-04 DIAGNOSIS — R300 Dysuria: Secondary | ICD-10-CM

## 2024-07-04 DIAGNOSIS — D500 Iron deficiency anemia secondary to blood loss (chronic): Secondary | ICD-10-CM | POA: Diagnosis not present

## 2024-07-04 DIAGNOSIS — Z888 Allergy status to other drugs, medicaments and biological substances status: Secondary | ICD-10-CM | POA: Diagnosis not present

## 2024-07-04 DIAGNOSIS — D3A8 Other benign neuroendocrine tumors: Secondary | ICD-10-CM | POA: Insufficient documentation

## 2024-07-04 DIAGNOSIS — K55059 Acute (reversible) ischemia of intestine, part and extent unspecified: Secondary | ICD-10-CM | POA: Diagnosis not present

## 2024-07-04 DIAGNOSIS — Z880 Allergy status to penicillin: Secondary | ICD-10-CM | POA: Insufficient documentation

## 2024-07-04 DIAGNOSIS — Z794 Long term (current) use of insulin: Secondary | ICD-10-CM | POA: Diagnosis not present

## 2024-07-04 DIAGNOSIS — Z881 Allergy status to other antibiotic agents status: Secondary | ICD-10-CM | POA: Diagnosis not present

## 2024-07-04 DIAGNOSIS — Z7901 Long term (current) use of anticoagulants: Secondary | ICD-10-CM | POA: Diagnosis not present

## 2024-07-04 DIAGNOSIS — H8112 Benign paroxysmal vertigo, left ear: Secondary | ICD-10-CM

## 2024-07-04 DIAGNOSIS — I82412 Acute embolism and thrombosis of left femoral vein: Secondary | ICD-10-CM

## 2024-07-04 DIAGNOSIS — N3000 Acute cystitis without hematuria: Secondary | ICD-10-CM

## 2024-07-04 LAB — CBC WITH DIFFERENTIAL (CANCER CENTER ONLY)
Abs Immature Granulocytes: 0.05 K/uL (ref 0.00–0.07)
Basophils Absolute: 0.1 K/uL (ref 0.0–0.1)
Basophils Relative: 1 %
Eosinophils Absolute: 0.2 K/uL (ref 0.0–0.5)
Eosinophils Relative: 3 %
HCT: 38.9 % (ref 36.0–46.0)
Hemoglobin: 12.5 g/dL (ref 12.0–15.0)
Immature Granulocytes: 1 %
Lymphocytes Relative: 37 %
Lymphs Abs: 3.3 K/uL (ref 0.7–4.0)
MCH: 30.8 pg (ref 26.0–34.0)
MCHC: 32.1 g/dL (ref 30.0–36.0)
MCV: 95.8 fL (ref 80.0–100.0)
Monocytes Absolute: 0.7 K/uL (ref 0.1–1.0)
Monocytes Relative: 8 %
Neutro Abs: 4.4 K/uL (ref 1.7–7.7)
Neutrophils Relative %: 50 %
Platelet Count: 220 K/uL (ref 150–400)
RBC: 4.06 MIL/uL (ref 3.87–5.11)
RDW: 14.6 % (ref 11.5–15.5)
WBC Count: 8.8 K/uL (ref 4.0–10.5)
nRBC: 0 % (ref 0.0–0.2)

## 2024-07-04 LAB — FERRITIN: Ferritin: 418 ng/mL — ABNORMAL HIGH (ref 11–307)

## 2024-07-04 LAB — URINALYSIS, COMPLETE (UACMP) WITH MICROSCOPIC
Bilirubin Urine: NEGATIVE
Glucose, UA: >=500 mg/dL — AB
Ketones, ur: NEGATIVE mg/dL
Leukocytes,Ua: NEGATIVE
Nitrite: POSITIVE — AB
Protein, ur: NEGATIVE mg/dL
Specific Gravity, Urine: 1.015 (ref 1.005–1.030)
pH: 5.5 (ref 5.0–8.0)

## 2024-07-04 LAB — IRON AND IRON BINDING CAPACITY (CC-WL,HP ONLY)
Iron: 123 ug/dL (ref 28–170)
Saturation Ratios: 38 % — ABNORMAL HIGH (ref 10.4–31.8)
TIBC: 323 ug/dL (ref 250–450)
UIBC: 200 ug/dL

## 2024-07-04 LAB — CMP (CANCER CENTER ONLY)
ALT: 76 U/L — ABNORMAL HIGH (ref 0–44)
AST: 68 U/L — ABNORMAL HIGH (ref 15–41)
Albumin: 4 g/dL (ref 3.5–5.0)
Alkaline Phosphatase: 249 U/L — ABNORMAL HIGH (ref 38–126)
Anion gap: 12 (ref 5–15)
BUN: 14 mg/dL (ref 8–23)
CO2: 26 mmol/L (ref 22–32)
Calcium: 8.9 mg/dL (ref 8.9–10.3)
Chloride: 102 mmol/L (ref 98–111)
Creatinine: 0.89 mg/dL (ref 0.44–1.00)
GFR, Estimated: >60 mL/min (ref >60)
Glucose, Bld: 412 mg/dL — ABNORMAL HIGH (ref 70–99)
Potassium: 3.9 mmol/L (ref 3.5–5.1)
Sodium: 140 mmol/L (ref 135–145)
Total Bilirubin: 0.4 mg/dL (ref 0.0–1.2)
Total Protein: 7.1 g/dL (ref 6.5–8.1)

## 2024-07-04 LAB — VITAMIN B12: Vitamin B-12: 250 pg/mL (ref 180–914)

## 2024-07-04 LAB — MAGNESIUM: Magnesium: 1.6 mg/dL — ABNORMAL LOW (ref 1.7–2.4)

## 2024-07-04 MED ORDER — LANREOTIDE ACETATE 120 MG/0.5ML ~~LOC~~ SOLN
120.0000 mg | Freq: Once | SUBCUTANEOUS | Status: AC
  Administered 2024-07-04: 120 mg via SUBCUTANEOUS
  Filled 2024-07-04: qty 120

## 2024-07-04 NOTE — Progress Notes (Incomplete)
  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   Pain issues, if any: Yes, she reports 7/10 abdominal pain.   Headaches: Yes not many  Dizziness: Yes reports several dizzy spells   SAFETY ISSUES: Prior radiation? No Pacemaker/ICD? No Possible current pregnancy? No Is the patient on methotrexate? No  Current Complaints/Details:  This encounter was conducted via telephone. The patient has provided two factor identification and has given verbal consent for this type of encounter and has been advised to only accept a meeting of this type in a secure network environment. No vitals were taken at this visit.

## 2024-07-04 NOTE — Progress Notes (Signed)
 Radiation Oncology         [phone] ________________________________  Initial Outpatient Consultation - Conducted via telephone for patient preference.  I spoke with the patient to conduct this consult visit via video. The patient was notified in advance and was offered an in person or telemedicine meeting and opted to proceed with a video consult.   Name: Wendy Barber        MRN: [id number]  Date of Service: 07/05/2024 DOB: 1961/03/14  RR:[id number], Reyes SAUNDERS, MD  Lanis Pupa, MD     REFERRING PHYSICIAN: Lanis Pupa, MD   DIAGNOSIS: Benign neoplasm of cerebral meninges, D32.0   HISTORY OF PRESENT ILLNESS: Wendy Barber is a 63 y.o. female seen in consultation for radiation therapy.  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  Briefly, regarding her appendiceal cancer, she has undergone CRS/HIPEC x2 for this. Once in 2005 and again in 2022 when she experienced a recurrence. Pathology from the initial 01/06/04 surgery revealed disseminated peritoneal adenomucinosis from a low grade mucinous carcinoma of the appendix. She underwent a second CRS/HIPEC in 2022 in setting of recurrence.   Unfortunately, since this procedure she has had severe diarrhea, recalcitrant to numerous antidiarrheals. In work up of her severe diarrhea, her original oncologic surgeon, Dr. Carolan, recommended a PET to assess for neuroendocrine tumor. This was performed on 04/11/2023 and demonstrated two foci of increased radiotracer uptake within the dome of the R lobe of the liver, suspicious for carcinoid metastases. Biopsy was attempted but was not successful in obtaining tissue from the lesions in question, however, MRI was  completed and radiographically diagnostic of metastatic carcinoid.   She is currently taking somatuline for carcinoid syndrome and her chromogranin A level has decreased. She unfortunately still has severe diarrhea and is considering a colostomy due to the severity of her diarrhea. She follows with Dr. Claryce at [facility] who offered her a colostomy. She is leaning towards having this completed. He is booking out around a month.  Wendy Barber also has a pituitary macroadenoma which was first identified on MRI Brain in 2023. Pituitary labs revealed an elevated ACTH  and elevated prolactin. It is stable in size and she has been medically managed with cabergoline . However, her most recent prolactin was low and so this was recently discontinued. She follows with endocrinology for management.  Her hx of meningioma dates back to 2021 when she underwent a CT Head for weakness and uncontrolled jerking. She was noted to have a 13 mm dural based calcification along the tentorium consistent with a meningioma. She underwent MRI Brain WWO on 04/06/20 for confusion and syncope and the meningioma was again seen and noted to be 1.8 cm in size and either invading or severely compressing the straight sinus. She has undergone repeat imaging of the head/brain on multiple occasions since then and there has been a slow increase in the size of the lesion with a 02/09/24 measurement of 2.2 cm x 1.9 cm compared to 2.2 cm x 1.5 cm and 2.1 cm x 1.3 cm in 2024 and 2023, respectively.  She follows with Dr. Lanis for her meningioma and given concern about the growth he has referred her to us  for consideration of stereotactic radiosurgery to this lesion.  Today she reports she is doing  fairly. Her diarrhea is a major problem for her understandably so. She has regular mild headaches which may be getting more frequent. She hasn't had a bad headache for a few weeks. She does report some numbness and tingling in her feet/ankles as well as a  history of RLS and fibromyalgia. She has been tested for lupus and this was negative. No new vision changes although she did recently have cataract surgery. Wears glasses for reading. She is frequently dizzy and unsteady on her feet but this is chronic.  PREVIOUS RADIATION THERAPY: No  AUTOIMMUNE DISEASE: No  MEDICAL DEVICES: No  PREGNANCY: postmenopausal, s/p TAH BSO in 2004   PAST MEDICAL HISTORY:  Past Medical History:  Diagnosis Date   Arthritis    Autoimmune hepatitis (HCC)    Back pain    Cancer (HCC)    pseudomyxoma peritonei, liver   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   Cardiomegaly 12/08/2022   without congestive failure, noted on ECHO   Cerebral meningioma (HCC)    Chronic diarrhea    Chronic fatigue syndrome    Diabetes mellitus without complication (HCC)    DVT of deep femoral vein, left (HCC) 03/19/2020   Fibromyalgia    GERD (gastroesophageal reflux disease)    Goals of care, counseling/discussion 03/19/2020   History of blood transfusion    Hypertension    Incomplete right bundle branch block (RBBB) 12/08/2023   Noted on EKG   Iron deficiency anemia due to chronic blood loss 05/14/2021   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   Migraines    Multiple thyroid  nodules    Pernicious anemia 05/14/2021   Pituitary adenoma (HCC)    PONV (postoperative nausea and vomiting)    not with recent surgeries   Presence of IVC filter 03/19/2020   Pulmonary embolism, bilateral (HCC) 03/19/2020   Rectal bleed 12/29/2023   Short bowel syndrome, unspecified    Vertigo        PAST SURGICAL HISTORY: Past Surgical History:  Procedure Laterality Date   ABDOMINAL SURGERY  2005   cytoreductive surgery with splenectomy, HIPC   ABDOMINAL SURGERY  2022   cytoreductive surgery with splenectomy, [facility]   ACHILLES TENDON REPAIR Bilateral    ARTHROPLASTY Bilateral    Shoulder   BIOPSY THYROID      CARPAL TUNNEL RELEASE  Bilateral    possibly right x2   CHOLECYSTECTOMY  07/1994   CYSTOSCOPY/URETEROSCOPY/HOLMIUM LASER/STENT PLACEMENT Left 03/26/2024   Procedure: CYSTOSCOPY/URETEROSCOPY/HOLMIUM LASER/STENT PLACEMENT;  Surgeon: Elisabeth Valli BIRCH, MD;  Location: [facility];  Service: Urology;  Laterality: Left;  CYSTOSCOPY/LEFT URETEROSCOPY/HOLMIUM LASER/STENT PLACEMENT/RETROGRADE PYELOGRAM   IR CATHETER TUBE CHANGE  02/02/2021   IR CATHETER TUBE CHANGE  02/25/2021   IR IMAGING GUIDED PORT INSERTION  06/20/2022   IR RADIOLOGIST EVAL & MGMT  02/24/2021   IR RADIOLOGIST EVAL & MGMT  03/10/2021   IR RADIOLOGIST EVAL & MGMT  06/22/2022   IR THROMBECT VENO MECH MOD SED  06/20/2022   IR US  GUIDE BX ASP/DRAIN  11/25/2019   IR US  GUIDE VASC ACCESS LEFT  06/20/2022   IR US  GUIDE VASC ACCESS RIGHT  06/20/2022   IR US  GUIDE VASC ACCESS RIGHT  06/20/2022   IR VENO/EXT/BI  06/20/2022   IR VENOCAVAGRAM IVC  06/20/2022   JOINT REPLACEMENT Bilateral    knees   KNEE ARTHROSCOPY     ORIF ANKLE FRACTURE Right 08/27/2019   Procedure: OPEN REDUCTION INTERNAL FIXATION RIGHT ANKLE FRACTURE;  Surgeon: Beverley Evalene BIRCH,  MD;  Location: [facility];  Service: Orthopedics;  Laterality: Right;   perineorrophy     SPINAL FUSION     TARSAL TUNNEL RELEASE Right    TONGUE BIOPSY     TOTAL ABDOMINAL HYSTERECTOMY Bilateral 2004   with BSO and appendectomy     FAMILY HISTORY:  Family History  Problem Relation Age of Onset   Depression Mother    Drug abuse Sister    Suicidality Sister    Alcohol abuse Brother    Drug abuse Brother      SOCIAL HISTORY:  reports that she has never smoked. She has never used smokeless tobacco. She reports that she does not drink alcohol and does not use drugs.   ALLERGIES: Ativan [lorazepam], Corticosteroids, Penicillins, Alprazolam, Erythromycin, Gabapentin , Prednisolone, Prednisone, and Savella  [milnacipran]   MEDICATIONS:  Current Outpatient Medications  Medication Sig Dispense Refill    acetaminophen  (TYLENOL ) 500 MG tablet Take 1,000 mg by mouth every 6 (six) hours as needed for moderate pain (pain score 4-6).     amLODipine  (NORVASC ) 5 MG tablet TAKE 1 TABLET(5 MG) BY MOUTH DAILY 90 tablet 1   busPIRone  (BUSPAR ) 15 MG tablet TAKE 1 TABLET(15 MG) BY MOUTH THREE TIMES DAILY 270 tablet 0   citalopram  (CELEXA ) 40 MG tablet Take 1 tablet (40 mg total) by mouth daily. 90 tablet 1   Continuous Glucose Sensor (DEXCOM G7 SENSOR) MISC 1 Device by Does not apply route as directed. 9 each 3   cyclobenzaprine  (FLEXERIL ) 10 MG tablet TAKE 1/2 TO 1 TABLET(5 TO 10 MG) BY MOUTH THREE TIMES DAILY AS NEEDED FOR MUSCLE SPASMS. START EVERY NIGHT AT BEDTIME AS NEEDED DUE TO SEDATION 30 tablet 0   diazepam  (VALIUM ) 5 MG tablet Take one pill as needed for procedures including radiation simulation scan, MRI for radiation planning, and radiation treatment 5 tablet 0   dicyclomine  (BENTYL ) 20 MG tablet TAKE 1 TABLET(20 MG) BY MOUTH THREE TIMES DAILY BEFORE MEALS 90 tablet 4   diphenoxylate -atropine  (LOMOTIL ) 2.5-0.025 MG tablet TAKE 2 TABLETS BY MOUTH FOUR TIMES DAILY AS NEEDED FOR LOOSE STOOLS OR DIARRHEA 240 tablet 2   eszopiclone  (LUNESTA ) 2 MG TABS tablet TAKE 1 TABLET(2 MG) BY MOUTH AT BEDTIME AS NEEDED FOR SLEEP 30 tablet 1   famotidine  (PEPCID ) 20 MG tablet TAKE 1 TABLET(20 MG) BY MOUTH TWICE DAILY 60 tablet 0   fondaparinux  (ARIXTRA ) 10 MG/0.8ML SOLN injection Inject 0.8 mLs (10 mg total) into the skin daily. 72 mL 3   glipiZIDE  (GLUCOTROL ) 5 MG tablet Take 1 tablet (5 mg total) by mouth 2 (two) times daily before a meal. 180 tablet 3   hydrocortisone  (ANUSOL -HC) 2.5 % rectal cream PLACE RECTALLY 4 TIMES DAILY AS NEEDED FOR HEMORRHOIDS OR ANAL ITCHING 30 g 1   HYDROmorphone  (DILAUDID ) 4 MG tablet Take 1 tablet (4 mg total) by mouth every 6 (six) hours as needed for severe pain (pain score 7-10). 60 tablet 0   insulin  glargine (LANTUS  SOLOSTAR) 100 UNIT/ML Solostar Pen Inject 30 Units into the skin  daily. 30 mL 4   Insulin  Pen Needle 32G X 4 MM MISC 1 Device by Does not apply route daily in the afternoon. 100 each 3   lidocaine -hydrocortisone  (ANAMANTLE) 3-1 % KIT Place 1 Application rectally 2 (two) times daily. 1 kit 0   loperamide  (IMODIUM ) 2 MG capsule Take 2 capsules (4 mg total) by mouth 4 (four) times daily as needed for diarrhea or loose stools.     meclizine  (  ANTIVERT ) 25 MG tablet TAKE 1 TABLET(25 MG) BY MOUTH THREE TIMES DAILY AS NEEDED FOR DIZZINESS 60 tablet 0   mirtazapine  (REMERON ) 15 MG tablet Take 1 tablet (15 mg total) by mouth at bedtime. 90 tablet 1   ondansetron  (ZOFRAN ) 8 MG tablet TAKE 1 TABLET(8 MG) BY MOUTH EVERY 8 HOURS AS NEEDED FOR NAUSEA OR VOMITING 30 tablet 2   orphenadrine  (NORFLEX ) 100 MG tablet TAKE 1 TABLET(100 MG) BY MOUTH AT BEDTIME AS NEEDED FOR MUSCLE SPASMS 30 tablet 2   pantoprazole  (PROTONIX ) 40 MG tablet TAKE 1 TABLET BY MOUTH EVERY DAY BEFORE BREAKFAST 90 tablet 1   pramoxine-hydrocortisone  (PROCTOCREAM-HC) 1-1 % rectal cream APPLY RECTALLY TO THE AFFECTED AREA TWICE DAILY 30 g 3   promethazine  (PHENERGAN ) 12.5 MG tablet TAKE 1 TABLET(12.5 MG) BY MOUTH EVERY 6 HOURS AS NEEDED FOR NAUSEA OR VOMITING 90 tablet 3   propranolol  (INDERAL ) 20 MG tablet Take 1 tablet (20 mg total) by mouth 2 (two) times daily. 180 tablet 1   rOPINIRole  (REQUIP ) 1 MG tablet Take 1 tablet (1 mg total) by mouth at bedtime. 30 tablet 3   terconazole  (TERAZOL 7 ) 0.4 % vaginal cream Apply topically twice daily for up to 7 days 45 g 2   No current facility-administered medications for this encounter.     REVIEW OF SYSTEMS: as noted in above HPI    PHYSICAL EXAM:   Deferred, video visit   LABORATORY DATA:  Lab Results  Component Value Date   WBC 8.8 07/04/2024   HGB 12.5 07/04/2024   HCT 38.9 07/04/2024   MCV 95.8 07/04/2024   PLT 220 07/04/2024   Lab Results  Component Value Date   NA 140 07/04/2024   K 3.9 07/04/2024   CL 102 07/04/2024   CO2 26 07/04/2024    Lab Results  Component Value Date   ALT 76 (H) 07/04/2024   AST 68 (H) 07/04/2024   ALKPHOS 249 (H) 07/04/2024   BILITOT 0.4 07/04/2024      RADIOGRAPHY:   MRI Brain WWO 02/09/24:  EXAM: MRI HEAD WITHOUT AND WITH CONTRAST   TECHNIQUE: Multiplanar, multiecho pulse sequences of the brain and surrounding structures were obtained without and with intravenous contrast.   CONTRAST:  10 cc Vueway    COMPARISON:  Wet 06/05/2023   FINDINGS: Brain: No abnormality affects the brainstem. There is continued enlargement of a meningioma associated with the medial aspect of the left tentorial leaflet with a broad surface along the medial left transverse sinus and the torcula. Maximal dimension in the sagittal plane today measures 22 x 19 mm compared with 22 x 15 mm in August of 2024 and 21 x 13 mm in September of 2023.   Cerebral hemispheres show a very few punctate foci of T2 and FLAIR signal in the white matter, not likely significant. There is not a pattern of widespread or significant small-vessel disease. No cortical or large vessel territory stroke. No hydrocephalus. No extra-axial fluid collection.   Within the central to left side of the pituitary gland, there is a macro adenoma measuring today 15 x 12 x 10 mm. When measured using the same technique, this is the same size. Small nonenhancing cystic component inferiorly again noted. No definite cavernous sinus invasion.   Vascular: Major vessels at the base of the brain show flow.   Skull and upper cervical spine: Negative   Sinuses/Orbits: Clear/normal   Other: None   IMPRESSION: 1. Continued enlargement of a meningioma associated with the medial  aspect of the left tentorial leaflet with a broad surface along the medial left transverse sinus and the torcula. Maximal dimension in the sagittal plane today measures 22 x 19 mm compared with 22 x 15 mm in August of 2024 and 21 x 13 mm in September of 2023. 2. Stable  pituitary macro adenoma measuring 15 x 12 x 10 mm.    MRI Brain [facility] 06/05/2023:  EXAM: MRI HEAD WITHOUT AND WITH CONTRAST   TECHNIQUE: Multiplanar, multiecho pulse sequences of the brain and surrounding structures were obtained without and with intravenous contrast.   CONTRAST:  10 cc of vueway  intravenous   COMPARISON:  07/23/2022   FINDINGS: Brain: Left eccentric solid and cystic pituitary mass measuring 11 mm craniocaudal on T2 weighted imaging. No suprasellar extension or cavernous sinus invasion. Normal appearing pituitary gland displaced into the right sella. In retrospect this was present on prior with similar dimensions by coronal T2 weighted imaging.   Meningioma measuring 19 mm near the torcula with evidence of invasion into the straight sinus.   No acute or subacute infarct, hemorrhage, hydrocephalus, or collection.   Vascular: Major flow voids and vascular enhancements are preserved. See comments about straight sinus above.   Skull and upper cervical spine: Normal marrow signal.   Sinuses/Orbits: Negative.   IMPRESSION: 1. 11 mm pituitary adenoma in the left eccentric sella. No invasion or mass effect on adjacent structures. 2. Unchanged 19 mm meningioma near the torcula with straight sinus invasion.     MRI Brain WO 07/23/2022:  EXAM: MRI HEAD WITHOUT CONTRAST   TECHNIQUE: Multiplanar, multiecho pulse sequences of the brain and surrounding structures were obtained without intravenous contrast.   COMPARISON:  Head CT from yesterday   FINDINGS: Brain: No acute infarction, hemorrhage, hydrocephalus, extra-axial collection or mass effect. 19 mm meningioma along the left para median posterior tentorium. No adjacent cortical edema. Few remote white matter insults   Vascular: No change in flow voids. The meningioma narrows the distal straight sinus.   Skull and upper cervical spine: Normal marrow signal.   Sinuses/Orbits: Partial opacification  of mastoid air cells with normal nasopharynx.   IMPRESSION: 1. No emergent finding. 2. Known meningioma along the left tentorium measuring 19 mm.    CT Head [facility] 12/01/21:  EXAM: CT HEAD WITHOUT AND WITH CONTRAST   TECHNIQUE: Contiguous axial images were obtained from the base of the skull through the vertex without and with intravenous contrast.   RADIATION DOSE REDUCTION: This exam was performed according to the departmental dose-optimization program which includes automated exposure control, adjustment of the mA and/or kV according to patient size and/or use of iterative reconstruction technique.   CONTRAST:  75mL ISOVUE -300 IOPAMIDOL  (ISOVUE -300) INJECTION 61%   COMPARISON:  January 2021   FINDINGS: Brain: Enhancing, partially calcified mass along the left tentorium and falx cerebelli. Size is similar to the prior MRI. There is compression and possible invasion of the straight sinus as before. No parenchymal mass or abnormal enhancement. There is no acute intracranial hemorrhage, significant mass effect, or edema. Gray-white differentiation is preserved. There is no extra-axial fluid collection. Ventricles and sulci are within normal limits in size and configuration.   Vascular: There is mild atherosclerotic calcification at the skull base.   Skull: Calvarium is unremarkable.   Sinuses/Orbits: No acute finding.   Other: None.   IMPRESSION: Similar posterior fossa meningioma without parenchymal edema. No new mass or abnormal enhancement.    MRI Brain [facility] 04/06/2020:  EXAM: MRI HEAD WITHOUT AND  WITH CONTRAST   TECHNIQUE: Multiplanar, multiecho pulse sequences of the brain and surrounding structures were obtained without and with intravenous contrast.   CONTRAST:  20mL MULTIHANCE  GADOBENATE DIMEGLUMINE  529 MG/ML IV SOLN   COMPARISON:  Head CT 11/21/2019   FINDINGS: Brain: No acute infarct, intracranial hemorrhage, midline shift, or extra-axial fluid  collection is identified. The ventricles and sulci are within normal limits for age without advanced or asymmetric atrophy. There is no significant white matter disease.   A homogeneously enhancing, partially calcified extra-axial mass along the posterior aspect of the tentorium centered slightly left of midline measures 1.7 x 1.8 x 1.1 cm. The mass either invades or severely compresses the straight sinus which remains patent, and the mass abuts but does not appear to invade the torcula and left transverse sinus. There is only minimal mass effect on the undersurface of the left occipital lobe and superior aspect of the left cerebellar hemisphere. There is no brain edema. No second enhancing intracranial lesion is identified.   Vascular: Major intracranial vascular flow voids are preserved.   Skull and upper cervical spine: Mild nonspecific bone marrow heterogeneity diffusely. No destructive lesion.   Sinuses/Orbits: Unremarkable orbits. Small mucous retention cyst in a posterior right ethmoid air cell. Small left mastoid effusion.   Other: None.   IMPRESSION: 1. 1.8 cm mass along the tentorium consistent with a meningioma. The mass either invades or severely compresses the straight sinus which remains patent. 2. Otherwise unremarkable appearance of the brain for age.    CT Head WO 11/21/2019:  EXAM: CT HEAD WITHOUT CONTRAST   TECHNIQUE: Contiguous axial images were obtained from the base of the skull through the vertex without intravenous contrast.   COMPARISON:  08/22/2019   FINDINGS: Brain: No evidence of acute infarction, hemorrhage, hydrocephalus, extra-axial collection. There is a dural-based calcification projecting inferiorly from the midline tentorium and measuring 13 mm, stable.   Motion artifact towards the vertex and at the skull base.   Vascular: No hyperdense vessel or unexpected calcification.   Skull: Normal. Negative for fracture or focal lesion.    Sinuses/Orbits: Negative   IMPRESSION: 1. Motion degraded head CT without acute finding. 2. 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  We discussed her diagnosis of an enlarging transverse sinus meningioma and reviewed general treatment paradigms for meningiomas. Discussed that surgery is sometimes an option but may not be favorered in certain locations or if patient is not a good surgical candidate. Sometimes surgeons prefer radiation for meningiomas involving a venous sinus.  Given your meningioma continues to enlarge, we recommend treatment.  We discussed the logistics of treatment including the need for a CT simulation and MRI for planning. We reviewed that immobilization is necessary for stereotactic treatment and that we use a short thermoplastic mask.  We explained that treatment is delivered in 1-3 outpatient sessions and does not require anesthesia.  We reviewed possible side effects including but not limited to fatigue, headache, nausea and transient swelling around the tumor that can occasionally cause neurologic symptoms, as well as rare long-term risks such as radiation necrosis.  We reiterated that she has a benign  tumor and if she wishes to proceed with her colostomy prior that is very reasonable. We may be able to complete her treatment prior to Dr. Claryce having availability for surgery, however. While this is a benign tumor, we advocate to treat as treatments are easier and safer when tumors are small and radiation dose/fields can be limited.   We also emphasized that the goal  of treatment is not for the tumor to regress quickly or completely but rather to prevent further growth and that stability on follow up imaging is considered a successful outcome.  Wendy Barber expressed understanding and agreement with this plan and wishes to proceed with radiation.   Our [facility] navigator will reach out to her about available dates and if she can complete treatment before her colostomy and wishes to do so, we certainly can. This is not an emergency and therefore we can certainly complete this after colostomy if she prefers or the timing is preferable to the patient.   She does have claustrophobia with MRIs and has some concern about the ability to tolerate the mask. She usually takes diazepam  before MRIs and so I will order some diazepam  for her to take prior to her simulation, MRI and on the day of her treatment. This was sent to her preferred pharmacy.   We will review her case and images in our CNS tumor board on Monday with neuroradiology and neurosurgery.   She has my contact information and was encouraged to reach out should she have any further questions.  ----  This encounter was conducted via video visit. The patient has provided two factor identification and has given verbal consent for this type of encounter and has been advised to only accept a meeting of this type in a secure network environment.  The attendants for this meeting include Providence Bis, Estefana Cha  and Grayce Trudy. During the encounter,  Providence Bis and I were located at [facility] Radiation Oncology Department. The patient was located at home.   Total time spent today in preparation for this visit was 60 minutes. This included patient care, imaging review, documentation, multidisciplinary discussion and coordination of care and follow up.    Estefana HERO. Cha, M.D.

## 2024-07-04 NOTE — Progress Notes (Signed)
 Hematology and Oncology Follow Up Visit  Wendy Barber [id number] 05-06-1961 63 y.o. 07/04/2024   Principle Diagnosis:  History of metastatic appendiceal cancer  -- recurrent  Superior mesenteric vein thrombus Thromboembolism of the RIGHT leg Pernicious anemia Acute pulmonary embolism-segmental right pulmonary artery Carcinoid/neuroendocrine carcinoma  Current Therapy:   HIPEC - Surgery done in [place] in 11/2020 Arixtra  10 mg subcu daily --start on 2/16 2024 Vitamin B12 5000 mcg PO daily  Somatuline 120 mg IM monthly-start on 07/12/2023     Interim History:  Wendy Barber is in for follow-up.  She has been quite busy.  She saw the neurosurgeon.  He did not think that the back issues were relevant to what the MRI of the spine showed.  He was little bit worried about the meningioma that she has.  She has been referred to Radiation Oncology for the consideration of stereotactic radiosurgery.  She also just saw Dr. Claryce at [facility].  She says that he is willing to do a colostomy on her.  This would certainly make her life a lot easier with her having all the diarrhea.  Because of her diarrhea, she really is not able to do much or go anywhere.  She does have bad diabetes.  Her blood sugar today is 412.  She is on insulin .  She has a CGM.  She is going to talk to her Endocrinologist and see if something can be done with her insulin  dosing.  She has had no problems with bleeding.  She is on Arixtra ..  She also takes vitamin B-12.  Her last Chromogranin A level was down to 34.  She has had no fever.  She has little bit of a temperature this morning however.  She has had no rashes.  She does have some chronic leg discomfort.  The last time that we saw her, her ferritin was 37 with an iron saturation of 23%.  Overall, I would have to say that her performance status is probably ECOG 1.    Wt Readings from Last 3 Encounters:  07/04/24 239 lb 12.8 oz (108.8 kg)  06/05/24 236 lb (107  kg)  05/20/24 235 lb (106.6 kg)    Medications:  Current Outpatient Medications:    acetaminophen  (TYLENOL ) 500 MG tablet, Take 1,000 mg by mouth every 6 (six) hours as needed for moderate pain (pain score 4-6)., Disp: , Rfl:    amLODipine  (NORVASC ) 5 MG tablet, TAKE 1 TABLET(5 MG) BY MOUTH DAILY, Disp: 90 tablet, Rfl: 1   busPIRone  (BUSPAR ) 15 MG tablet, TAKE 1 TABLET(15 MG) BY MOUTH THREE TIMES DAILY, Disp: 270 tablet, Rfl: 0   citalopram  (CELEXA ) 40 MG tablet, Take 1 tablet (40 mg total) by mouth daily., Disp: 90 tablet, Rfl: 1   Continuous Glucose Sensor (DEXCOM G7 SENSOR) MISC, 1 Device by Does not apply route as directed., Disp: 9 each, Rfl: 3   cyclobenzaprine  (FLEXERIL ) 10 MG tablet, TAKE 1/2 TO 1 TABLET(5 TO 10 MG) BY MOUTH THREE TIMES DAILY AS NEEDED FOR MUSCLE SPASMS. START EVERY NIGHT AT BEDTIME AS NEEDED DUE TO SEDATION, Disp: 30 tablet, Rfl: 0   dicyclomine  (BENTYL ) 20 MG tablet, TAKE 1 TABLET(20 MG) BY MOUTH THREE TIMES DAILY BEFORE MEALS, Disp: 90 tablet, Rfl: 4   diphenoxylate -atropine  (LOMOTIL ) 2.5-0.025 MG tablet, TAKE 2 TABLETS BY MOUTH FOUR TIMES DAILY AS NEEDED FOR LOOSE STOOLS OR DIARRHEA, Disp: 240 tablet, Rfl: 2   eszopiclone  (LUNESTA ) 2 MG TABS tablet, TAKE 1 TABLET(2 MG) BY MOUTH AT BEDTIME AS  NEEDED FOR SLEEP, Disp: 30 tablet, Rfl: 1   famotidine  (PEPCID ) 20 MG tablet, TAKE 1 TABLET(20 MG) BY MOUTH TWICE DAILY, Disp: 60 tablet, Rfl: 0   fondaparinux  (ARIXTRA ) 10 MG/0.8ML SOLN injection, Inject 0.8 mLs (10 mg total) into the skin daily., Disp: 72 mL, Rfl: 3   glipiZIDE  (GLUCOTROL ) 5 MG tablet, Take 1 tablet (5 mg total) by mouth 2 (two) times daily before a meal., Disp: 180 tablet, Rfl: 3   hydrocortisone  (ANUSOL -HC) 2.5 % rectal cream, PLACE RECTALLY 4 TIMES DAILY AS NEEDED FOR HEMORRHOIDS OR ANAL ITCHING, Disp: 30 g, Rfl: 1   HYDROmorphone  (DILAUDID ) 4 MG tablet, Take 1 tablet (4 mg total) by mouth every 6 (six) hours as needed for severe pain (pain score 7-10)., Disp:  60 tablet, Rfl: 0   insulin  glargine (LANTUS  SOLOSTAR) 100 UNIT/ML Solostar Pen, Inject 20 Units into the skin daily., Disp: 30 mL, Rfl: 4   Insulin  Pen Needle 32G X 4 MM MISC, 1 Device by Does not apply route daily in the afternoon., Disp: 100 each, Rfl: 3   lidocaine -hydrocortisone  (ANAMANTLE) 3-1 % KIT, Place 1 Application rectally 2 (two) times daily., Disp: 1 kit, Rfl: 0   loperamide  (IMODIUM ) 2 MG capsule, Take 2 capsules (4 mg total) by mouth 4 (four) times daily as needed for diarrhea or loose stools., Disp: , Rfl:    meclizine  (ANTIVERT ) 25 MG tablet, TAKE 1 TABLET(25 MG) BY MOUTH THREE TIMES DAILY AS NEEDED FOR DIZZINESS, Disp: 60 tablet, Rfl: 0   mirtazapine  (REMERON ) 15 MG tablet, Take 1 tablet (15 mg total) by mouth at bedtime., Disp: 90 tablet, Rfl: 1   ondansetron  (ZOFRAN ) 8 MG tablet, TAKE 1 TABLET(8 MG) BY MOUTH EVERY 8 HOURS AS NEEDED FOR NAUSEA OR VOMITING, Disp: 30 tablet, Rfl: 2   orphenadrine  (NORFLEX ) 100 MG tablet, TAKE 1 TABLET(100 MG) BY MOUTH AT BEDTIME AS NEEDED FOR MUSCLE SPASMS, Disp: 30 tablet, Rfl: 2   pantoprazole  (PROTONIX ) 40 MG tablet, TAKE 1 TABLET BY MOUTH EVERY DAY BEFORE BREAKFAST, Disp: 90 tablet, Rfl: 1   pramoxine-hydrocortisone  (PROCTOCREAM-HC) 1-1 % rectal cream, APPLY RECTALLY TO THE AFFECTED AREA TWICE DAILY, Disp: 30 g, Rfl: 3   promethazine  (PHENERGAN ) 12.5 MG tablet, TAKE 1 TABLET(12.5 MG) BY MOUTH EVERY 6 HOURS AS NEEDED FOR NAUSEA OR VOMITING, Disp: 90 tablet, Rfl: 3   propranolol  (INDERAL ) 20 MG tablet, Take 1 tablet (20 mg total) by mouth 2 (two) times daily., Disp: 180 tablet, Rfl: 1   terconazole  (TERAZOL 7 ) 0.4 % vaginal cream, Apply topically twice daily for up to 7 days, Disp: 45 g, Rfl: 2   rOPINIRole  (REQUIP ) 1 MG tablet, Take 1 tablet (1 mg total) by mouth at bedtime., Disp: 30 tablet, Rfl: 3  Allergies:  Allergies  Allergen Reactions   Ativan [Lorazepam] Swelling and Other (See Comments)    Face & Throat Swelling  Note: tolerates  midazolam  fine   Corticosteroids Other (See Comments)    Psychotic behavior    Penicillins Shortness Of Breath and Other (See Comments)    Irregular and rapid Heart Rate, too    Alprazolam Hives and Other (See Comments)    Hard to arouse, unresponsiveness also   Erythromycin Nausea And Vomiting        Gabapentin  Other (See Comments)    Made the patient feel depressed   Prednisolone Anxiety   Prednisone Anxiety and Other (See Comments)    Anxiety & Nervous Breakdown   Savella  [Milnacipran] Other (See Comments)  Reaction not noted    Past Medical History, Surgical history, Social history, and Family History were reviewed and updated.  Review of Systems: Review of Systems  Constitutional:  Positive for fatigue and unexpected weight change.  HENT:  Negative.    Eyes: Negative.   Respiratory: Negative.    Cardiovascular: Negative.   Gastrointestinal:  Positive for abdominal pain and diarrhea.  Endocrine: Negative.   Genitourinary:  Positive for dysuria.   Musculoskeletal:  Positive for back pain.  Skin: Negative.   Neurological: Negative.   Hematological: Negative.   Psychiatric/Behavioral:  Negative for depression.     Physical Exam: Vitals:   07/04/24 1504  BP: (!) 141/96  Pulse: 76  Resp: 20  Temp: 98.4 F (36.9 C)  SpO2: 97%   Wt Readings from Last 3 Encounters:  07/04/24 239 lb 12.8 oz (108.8 kg)  06/05/24 236 lb (107 kg)  05/20/24 235 lb (106.6 kg)    Physical Exam Vitals reviewed.  HENT:     Head: Normocephalic and atraumatic.  Eyes:     Pupils: Pupils are equal, round, and reactive to light.  Cardiovascular:     Rate and Rhythm: Normal rate and regular rhythm.     Heart sounds: Normal heart sounds.  Pulmonary:     Effort: Pulmonary effort is normal.     Breath sounds: Normal breath sounds.  Abdominal:     General: Bowel sounds are normal.     Palpations: Abdomen is soft.  Musculoskeletal:        General: No tenderness or deformity.  Normal range of motion.     Cervical back: Normal range of motion.  Lymphadenopathy:     Cervical: No cervical adenopathy.  Skin:    General: Skin is warm and dry.     Findings: No erythema or rash.  Neurological:     Mental Status: She is alert and oriented to person, place, and time.  Psychiatric:        Behavior: Behavior normal.        Thought Content: Thought content normal.        Judgment: Judgment normal.    Lab Results  Component Value Date   WBC 8.8 07/04/2024   HGB 12.5 07/04/2024   HCT 38.9 07/04/2024   MCV 95.8 07/04/2024   PLT 220 07/04/2024     Chemistry      Component Value Date/Time   NA 140 06/05/2024 1219   NA 137 04/24/2023 1437   K 3.4 (L) 06/05/2024 1219   CL 101 06/05/2024 1219   CO2 27 06/05/2024 1219   BUN 12 06/05/2024 1219   BUN 18 04/24/2023 1437   CREATININE 0.89 06/05/2024 1219   CREATININE 0.96 01/19/2024 1415      Component Value Date/Time   CALCIUM  9.0 06/05/2024 1219   ALKPHOS 208 (H) 06/05/2024 1219   AST 42 (H) 06/05/2024 1219   ALT 39 06/05/2024 1219   BILITOT 0.5 06/05/2024 1219      Impression and Plan: Ms. Badders is a very charming 63 year-old white female. She has recurrent appendiceal cancer.  She underwent a HIPEC procedure in [place] a couple years ago. Unfortunately she had had chronic diarrhea since including C. Diff which has been successfully treatment.   I am so glad that Dr. Claryce has offered to do a colostomy.  She is going to think about this.  I encouraged her to do this as this would really help her quality of life.  I do not see  a problem with her having radiosurgery for a meningioma.  We will go ahead with Somatuline today.  I think that we might be able to hold off on further Somatuline since she has done so well.  We will see what her iron studies look like.  I think that we can probably get her back now in about 6 weeks or so.  Hopefully, she will have a decision about surgery.   Maude JONELLE Crease, MD 9/11/20253:25 PM

## 2024-07-04 NOTE — Patient Instructions (Signed)
 Lanreotide Injection What is this medication? LANREOTIDE (lan REE oh tide) treats high levels of growth hormone (acromegaly). It is used when other therapies have not worked well enough or cannot be tolerated. It works by reducing the amount of growth hormone your body makes. This reduces symptoms and the risk of health problems caused by too much growth hormone, such as diabetes and heart disease. It may also be used to treat neuroendocrine tumors, a cancer of the cells that release hormones and other substances in your body. It works by slowing down the release of these substances from the cells. This slows tumor growth. It also decreases the symptoms of carcinoid syndrome, such as flushing or diarrhea. This medicine may be used for other purposes; ask your health care provider or pharmacist if you have questions. COMMON BRAND NAME(S): Somatuline [place] What should I tell my care team before I take this medication? They need to know if you have any of these conditions: Diabetes Gallbladder disease Heart disease Kidney disease Liver disease Pancreatic disease Thyroid disease An unusual or allergic reaction to lanreotide, other medications, foods, dyes, or preservatives Pregnant or trying to get pregnant Breastfeeding How should I use this medication? This medication is injected under the skin. It is given by your care team in a hospital or clinic setting. Talk to your care team about the use of this medication in children. Special care may be needed. Overdosage: If you think you have taken too much of this medicine contact a poison control center or emergency room at once. NOTE: This medicine is only for you. Do not share this medicine with others. What if I miss a dose? Keep appointments for follow-up doses. It is important not to miss your dose. Call your care team if you are unable to keep an appointment. What may interact with this medication? Bromocriptine Cyclosporine Certain  medications for blood pressure, heart disease, irregular heartbeat Certain medications for diabetes Quinidine Terfenadine This list may not describe all possible interactions. Give your health care provider a list of all the medicines, herbs, non-prescription drugs, or dietary supplements you use. Also tell them if you smoke, drink alcohol, or use illegal drugs. Some items may interact with your medicine. What should I watch for while using this medication? Visit your care team for regular checks on your progress. Tell your care team if your symptoms do not start to get better or if they get worse. Your condition will be monitored carefully while you are receiving this medication. You may need blood work while you are taking this medication. This medication may increase blood sugar. The risk may be higher in patients who already have diabetes. Ask your care team what you can do to lower your risk of diabetes while taking this medication. Talk to your care team if you wish to become pregnant or think you may be pregnant. This medication can cause serious birth defects. Do not breast-feed while taking this medication and for 6 months after stopping therapy. This medication may cause infertility. Talk to your care team if you are concerned about your fertility. What side effects may I notice from receiving this medication? Side effects that you should report to your care team as soon as possible: Allergic reactions--skin rash, itching, hives, swelling of the face, lips, tongue, or throat Gallbladder problems--severe stomach pain, nausea, vomiting, fever High blood sugar (hyperglycemia)--increased thirst or amount of urine, unusual weakness or fatigue, blurry vision Increase in blood pressure Low blood sugar (hypoglycemia)--pale, blue or purple  skin or lips, sweating, fussiness, rapid heartbeat, poor feeding, low body temperature Low thyroid levels (hypothyroidism)--unusual weakness or fatigue,  increased sensitivity to cold, constipation, hair loss, dry skin, weight gain, feelings of depression Oily or light-colored stools, diarrhea, bloating, weight loss Slow heartbeat--dizziness, feeling faint or lightheaded, confusion, trouble breathing, unusual weakness or fatigue Side effects that usually do not require medical attention (report these to your care team if they continue or are bothersome): Diarrhea Dizziness Headache Muscle spasms Nausea Pain, redness, or irritation at injection site Stomach pain This list may not describe all possible side effects. Call your doctor for medical advice about side effects. You may report side effects to FDA at 1-800-FDA-1088. Where should I keep my medication? This medication is given in a hospital or clinic. It will not be stored at home. NOTE: This sheet is a summary. It may not cover all possible information. If you have questions about this medicine, talk to your doctor, pharmacist, or health care provider.  2024 Elsevier/Gold Standard (2023-09-22 00:00:00)

## 2024-07-05 ENCOUNTER — Ambulatory Visit
Admission: RE | Admit: 2024-07-05 | Discharge: 2024-07-05 | Disposition: A | Discharge status: Home / Self Care | Source: Ambulatory Visit | Attending: Radiation Oncology | Admitting: Radiation Oncology

## 2024-07-05 ENCOUNTER — Other Ambulatory Visit: Payer: Self-pay | Admitting: Hematology & Oncology

## 2024-07-05 ENCOUNTER — Ambulatory Visit
Admission: RE | Admit: 2024-07-05 | Discharge: 2024-07-05 | Discharge status: Home / Self Care | Source: Ambulatory Visit | Attending: Radiation Oncology | Admitting: Radiation Oncology

## 2024-07-05 ENCOUNTER — Ambulatory Visit: Payer: Self-pay | Admitting: Hematology & Oncology

## 2024-07-05 ENCOUNTER — Other Ambulatory Visit: Payer: Self-pay | Admitting: Radiation Therapy

## 2024-07-05 DIAGNOSIS — D329 Benign neoplasm of meninges, unspecified: Secondary | ICD-10-CM

## 2024-07-05 DIAGNOSIS — D320 Benign neoplasm of cerebral meninges: Secondary | ICD-10-CM

## 2024-07-05 MED ORDER — DIAZEPAM 5 MG PO TABS: ORAL_TABLET | ORAL | 0 refills | Status: DC

## 2024-07-05 MED ORDER — LANTUS SOLOSTAR 100 UNIT/ML ~~LOC~~ SOPN: 30.0000 [IU] | PEN_INJECTOR | Freq: Every day | SUBCUTANEOUS | 4 refills | Status: DC

## 2024-07-05 MED ORDER — CIPROFLOXACIN HCL 500 MG PO TABS: 500.0000 mg | ORAL_TABLET | Freq: Two times a day (BID) | ORAL | 1 refills | Status: DC

## 2024-07-05 NOTE — Addendum Note (Signed)
 Encounter addended by: Maritza Stagger, MD on: 07/05/2024 2:25 PM  Actions taken: Order list changed

## 2024-07-06 LAB — CHROMOGRANIN A: Chromogranin A (ng/mL): 39.1 ng/mL (ref 0.0–101.8)

## 2024-07-07 ENCOUNTER — Other Ambulatory Visit (HOSPITAL_COMMUNITY): Payer: Self-pay

## 2024-07-07 LAB — URINE CULTURE: Culture: >=100000 — AB

## 2024-07-08 ENCOUNTER — Ambulatory Visit (INDEPENDENT_AMBULATORY_CARE_PROVIDER_SITE_OTHER): Payer: Medicare Other | Admitting: Ophthalmology

## 2024-07-08 ENCOUNTER — Other Ambulatory Visit (HOSPITAL_COMMUNITY): Payer: Self-pay

## 2024-07-08 ENCOUNTER — Encounter

## 2024-07-08 DIAGNOSIS — Z7984 Long term (current) use of oral hypoglycemic drugs: Secondary | ICD-10-CM

## 2024-07-08 DIAGNOSIS — Z961 Presence of intraocular lens: Secondary | ICD-10-CM

## 2024-07-08 DIAGNOSIS — E119 Type 2 diabetes mellitus without complications: Secondary | ICD-10-CM

## 2024-07-08 DIAGNOSIS — Z794 Long term (current) use of insulin: Secondary | ICD-10-CM

## 2024-07-08 DIAGNOSIS — H543 Unqualified visual loss, both eyes: Secondary | ICD-10-CM

## 2024-07-08 DIAGNOSIS — H04123 Dry eye syndrome of bilateral lacrimal glands: Secondary | ICD-10-CM

## 2024-07-08 DIAGNOSIS — I10 Essential (primary) hypertension: Secondary | ICD-10-CM

## 2024-07-08 DIAGNOSIS — H35033 Hypertensive retinopathy, bilateral: Secondary | ICD-10-CM

## 2024-07-08 DIAGNOSIS — H25813 Combined forms of age-related cataract, bilateral: Secondary | ICD-10-CM

## 2024-07-09 ENCOUNTER — Telehealth (INDEPENDENT_AMBULATORY_CARE_PROVIDER_SITE_OTHER): Admitting: Behavioral Health

## 2024-07-09 ENCOUNTER — Encounter: Payer: Self-pay | Admitting: Behavioral Health

## 2024-07-09 DIAGNOSIS — F331 Major depressive disorder, recurrent, moderate: Secondary | ICD-10-CM

## 2024-07-09 DIAGNOSIS — F411 Generalized anxiety disorder: Secondary | ICD-10-CM

## 2024-07-09 DIAGNOSIS — F99 Mental disorder, not otherwise specified: Secondary | ICD-10-CM | POA: Diagnosis not present

## 2024-07-09 DIAGNOSIS — F5105 Insomnia due to other mental disorder: Secondary | ICD-10-CM

## 2024-07-09 MED ORDER — CITALOPRAM HYDROBROMIDE 40 MG PO TABS: 40.0000 mg | ORAL_TABLET | Freq: Every day | ORAL | 1 refills | Status: DC

## 2024-07-09 MED ORDER — ESZOPICLONE 2 MG PO TABS
ORAL_TABLET | ORAL | 3 refills | Status: AC
Start: 2024-07-09 — End: ?

## 2024-07-09 MED ORDER — MIRTAZAPINE 15 MG PO TABS: 15.0000 mg | ORAL_TABLET | Freq: Every day | ORAL | 1 refills | Status: AC

## 2024-07-09 MED ORDER — BUSPIRONE HCL 15 MG PO TABS: 15.0000 mg | ORAL_TABLET | Freq: Three times a day (TID) | ORAL | 0 refills | Status: AC

## 2024-07-09 MED ORDER — PROPRANOLOL HCL 20 MG PO TABS
20.0000 mg | ORAL_TABLET | Freq: Two times a day (BID) | ORAL | 1 refills | Status: AC
Start: 2024-07-09 — End: ?

## 2024-07-09 NOTE — Progress Notes (Addendum)
 [facility]  Patient ID: Donneisha Beane,  MRN: [id number]  PCP: [name], MD  Date of Evaluation: 07/16/2024 Time spent:30 minutes   Virtual Visit via Video Note   I connected with pt @ on 07/09/24 at  11:30 AM EDT by a video enabled telemedicine application and verified that I am speaking with the correct person using two identifiers.   I discussed the limitations of evaluation and management by telemedicine and the availability of in person appointments. The patient expressed understanding and agreed to proceed.   I discussed the assessment and treatment plan with the patient. The patient was provided an opportunity to ask questions and all were answered. The patient agreed with the plan and demonstrated an understanding of the instructions.   The patient was advised to call back or seek an in-person evaluation if the symptoms worsen or if the condition fails to improve as anticipated.   I provided 30 minutes of non-face-to-face time during this encounter.  The patient was located at home.  The provider was located at [facility].     Redell DELENA Pizza, NP     Chief Complaint:  Chief Complaint   Depression; Anxiety; Follow-up; Patient Education; Medication Refill; Stress     HISTORY/CURRENT STATUS: HPI  63 year old female presents to this office via video visit  for follow up and medication management.  Still facing a multitude of health issues. Has found surgeon that has agreed to colostomy. She is now considering time frames. She believes this may help with quality of life.  She is discouraged right now  but  sleeping better. She understands that depression related to chronic disease is very difficult to treat. For now not wanting to make any medication adjustments.  Reports her anxiety today at 3/10 and depression at 4/10. She is sleeping 6-7 hours per night. Her family remains highly supportive. She denies any mania, no psychosis, No current SI  or HI.     Individual Medical History/ Review of Systems: Changes? :No   Allergies: Ativan [lorazepam], Corticosteroids, Penicillins, Alprazolam, Erythromycin, Gabapentin , Prednisolone, Prednisone, and Savella  [milnacipran]  Current Medications:  Current Outpatient Medications:    acetaminophen  (TYLENOL ) 500 MG tablet, Take 1,000 mg by mouth every 6 (six) hours as needed for moderate pain (pain score 4-6)., Disp: , Rfl:    amLODipine  (NORVASC ) 5 MG tablet, TAKE 1 TABLET(5 MG) BY MOUTH DAILY, Disp: 90 tablet, Rfl: 1   busPIRone  (BUSPAR ) 15 MG tablet, Take 1 tablet (15 mg total) by mouth 3 (three) times daily., Disp: 270 tablet, Rfl: 0   ciprofloxacin  (CIPRO ) 500 MG tablet, Take 1 tablet (500 mg total) by mouth 2 (two) times daily., Disp: 14 tablet, Rfl: 1   citalopram  (CELEXA ) 40 MG tablet, Take 1 tablet (40 mg total) by mouth daily., Disp: 90 tablet, Rfl: 1   Continuous Glucose Sensor (DEXCOM G7 SENSOR) MISC, 1 Device by Does not apply route as directed., Disp: 9 each, Rfl: 3   cyclobenzaprine  (FLEXERIL ) 10 MG tablet, TAKE 1/2 TO 1 TABLET(5 TO 10 MG) BY MOUTH THREE TIMES DAILY AS NEEDED FOR MUSCLE SPASMS. START EVERY NIGHT AT BEDTIME AS NEEDED DUE TO SEDATION, Disp: 30 tablet, Rfl: 0   diazepam  (VALIUM ) 10 MG tablet, Take one pill as needed for procedures including radiation simulation scan, MRI for radiation planning, and radiation treatment, Disp: 3 tablet, Rfl: 0   dicyclomine  (BENTYL ) 20 MG tablet, TAKE 1 TABLET(20 MG) BY MOUTH THREE TIMES DAILY BEFORE MEALS, Disp:  90 tablet, Rfl: 4   diphenoxylate -atropine  (LOMOTIL ) 2.5-0.025 MG tablet, TAKE 2 TABLETS BY MOUTH FOUR TIMES DAILY AS NEEDED FOR LOOSE STOOLS OR DIARRHEA, Disp: 240 tablet, Rfl: 2   eszopiclone  (LUNESTA ) 2 MG TABS tablet, TAKE 1 TABLET(2 MG) BY MOUTH AT BEDTIME AS NEEDED FOR SLEEP, Disp: 30 tablet, Rfl: 3   famotidine  (PEPCID ) 20 MG tablet, TAKE 1 TABLET(20 MG) BY MOUTH TWICE DAILY, Disp: 60 tablet, Rfl: 0   fondaparinux  (ARIXTRA )  10 MG/0.8ML SOLN injection, Inject 0.8 mLs (10 mg total) into the skin daily., Disp: 72 mL, Rfl: 3   glipiZIDE  (GLUCOTROL ) 5 MG tablet, Take 1 tablet (5 mg total) by mouth 2 (two) times daily before a meal., Disp: 180 tablet, Rfl: 3   hydrocortisone  (ANUSOL -HC) 2.5 % rectal cream, PLACE RECTALLY 4 TIMES DAILY AS NEEDED FOR HEMORRHOIDS OR ANAL ITCHING, Disp: 30 g, Rfl: 1   HYDROmorphone  (DILAUDID ) 4 MG tablet, Take 1 tablet (4 mg total) by mouth every 6 (six) hours as needed for severe pain (pain score 7-10)., Disp: 60 tablet, Rfl: 0   insulin  glargine (LANTUS  SOLOSTAR) 100 UNIT/ML Solostar Pen, Inject 30 Units into the skin daily., Disp: 30 mL, Rfl: 4   Insulin  Pen Needle 32G X 4 MM MISC, 1 Device by Does not apply route daily in the afternoon., Disp: 100 each, Rfl: 3   lidocaine -hydrocortisone  (ANAMANTLE) 3-1 % KIT, Place 1 Application rectally 2 (two) times daily., Disp: 1 kit, Rfl: 0   loperamide  (IMODIUM ) 2 MG capsule, Take 2 capsules (4 mg total) by mouth 4 (four) times daily as needed for diarrhea or loose stools., Disp: , Rfl:    meclizine  (ANTIVERT ) 25 MG tablet, TAKE 1 TABLET(25 MG) BY MOUTH THREE TIMES DAILY AS NEEDED FOR DIZZINESS, Disp: 60 tablet, Rfl: 0   mirtazapine  (REMERON ) 15 MG tablet, Take 1 tablet (15 mg total) by mouth at bedtime., Disp: 90 tablet, Rfl: 1   ondansetron  (ZOFRAN ) 8 MG tablet, TAKE 1 TABLET(8 MG) BY MOUTH EVERY 8 HOURS AS NEEDED FOR NAUSEA OR VOMITING, Disp: 30 tablet, Rfl: 2   orphenadrine  (NORFLEX ) 100 MG tablet, TAKE 1 TABLET(100 MG) BY MOUTH AT BEDTIME AS NEEDED FOR MUSCLE SPASMS, Disp: 30 tablet, Rfl: 2   pantoprazole  (PROTONIX ) 40 MG tablet, TAKE 1 TABLET BY MOUTH EVERY DAY BEFORE BREAKFAST, Disp: 90 tablet, Rfl: 1   pramoxine-hydrocortisone  (PROCTOCREAM-HC) 1-1 % rectal cream, APPLY RECTALLY TO THE AFFECTED AREA TWICE DAILY, Disp: 30 g, Rfl: 3   promethazine  (PHENERGAN ) 12.5 MG tablet, TAKE 1 TABLET(12.5 MG) BY MOUTH EVERY 6 HOURS AS NEEDED FOR NAUSEA OR  VOMITING, Disp: 90 tablet, Rfl: 3   propranolol  (INDERAL ) 20 MG tablet, Take 1 tablet (20 mg total) by mouth 2 (two) times daily., Disp: 180 tablet, Rfl: 1   terconazole  (TERAZOL 7 ) 0.4 % vaginal cream, Apply topically twice daily for up to 7 days, Disp: 45 g, Rfl: 2 Medication Side Effects: none  Family Medical/ Social History: Changes? No  MENTAL HEALTH EXAM:  There were no vitals taken for this visit.There is no height or weight on file to calculate BMI.  General Appearance: Casual, Neat, and Well Groomed  Eye Contact:  Good  Speech:  Clear and Coherent  Volume:  Normal  Mood:  Anxious, Depressed, and Dysphoric  Affect:  Appropriate, Congruent, Depressed, and Anxious  Thought Process:  Coherent  Orientation:  Full (Time, Place, and Person)  Thought Content: Logical   Suicidal Thoughts:  No  Homicidal Thoughts:  No  Memory:  WNL  Judgement:  Good  Insight:  Good  Psychomotor Activity:  Normal  Concentration:  Concentration: Good  Recall:  Good  Fund of Knowledge: Good  Language: Good  Assets:  Desire for Improvement  ADL's:  Intact  Cognition: WNL  Prognosis:  Good    DIAGNOSES:    ICD-10-CM   1. Major depressive disorder, recurrent episode, moderate (HCC)  F33.1 citalopram  (CELEXA ) 40 MG tablet    busPIRone  (BUSPAR ) 15 MG tablet    2. Generalized anxiety disorder  F41.1 citalopram  (CELEXA ) 40 MG tablet    propranolol  (INDERAL ) 20 MG tablet    busPIRone  (BUSPAR ) 15 MG tablet    3. Insomnia due to other mental disorder  F51.05 mirtazapine  (REMERON ) 15 MG tablet   F99 eszopiclone  (LUNESTA ) 2 MG TABS tablet      Receiving Psychotherapy: No    RECOMMENDATIONS:   Greater than 50% of 30 min video visit with patient was spent on counseling and coordination of care. Discussed recent decline with health again. She is considering colostomy surgery soon.  Has benign tumor on brain that may need treatment soon. Umbilical fistula has healed.  Multiple pending follow ups  with specialist pending. Chronic pain complicates treatment. Pt does not want to adjust meds this visit.    We agreed to:   Continue Buspar   to 15 mg to  three times daily as needed. Continue Celexa  to 40 mg daily. Will take with food to help with nausea. Could make diarrhea worse in the beginning but may pass after a week or two.  To Continue Lunesta  5 mg at betime. Belsomra  was not approved by insurance. She is happy with Lunesta  but says causes metallic taste in mouth next day.  To increase Mirtazapine  to 15 mg daily at bedtime for sleep.   Continue Propranolol  20 mg twice daily Will report worsening symptoms or side effects promptly   To follow up in 16  weeks to reassess Will continue to follow up with PCP regularly Provided emergency contact information Reviewed PDMP     Redell A. Trichelle Lehan, NP

## 2024-07-10 ENCOUNTER — Telehealth: Payer: Self-pay | Admitting: Dietician

## 2024-07-10 NOTE — Telephone Encounter (Signed)
 Patient referral from RD at [facility]. Patient picked up said she was feeling pretty good, out and about today and couldn't talk. Provided my contact information in a text to return call to set up a nutrition consult.  Micheline Craven, RDN, LDN Registered Dietitian,  [facility] Part Time Remote (Usual office hours: Tuesday-Thursday) Cell: [phone]

## 2024-07-11 ENCOUNTER — Telehealth: Payer: Self-pay | Admitting: Radiation Therapy

## 2024-07-11 NOTE — Telephone Encounter (Signed)
 I spoke with Wendy Barber about her upcoming radiation treatment planning appointments. Gave her an opportunity to ask questions, she feels good with the plan and plans to attend all scheduled visits. Wendy Barber has my contact information and was encouraged to call with any questions or concerns along the way.   Devere Perch R.T(R)(T) Radiation Special Procedures Lead

## 2024-07-12 ENCOUNTER — Other Ambulatory Visit: Payer: Self-pay | Admitting: Radiation Oncology

## 2024-07-12 MED ORDER — DIAZEPAM 10 MG PO TABS
ORAL_TABLET | ORAL | 0 refills | Status: DC
Start: 2024-07-12 — End: 2024-08-07

## 2024-07-15 ENCOUNTER — Encounter (INDEPENDENT_AMBULATORY_CARE_PROVIDER_SITE_OTHER): Payer: Self-pay | Admitting: Ophthalmology

## 2024-07-18 ENCOUNTER — Other Ambulatory Visit: Payer: Self-pay | Admitting: Hematology & Oncology

## 2024-07-18 DIAGNOSIS — H8112 Benign paroxysmal vertigo, left ear: Secondary | ICD-10-CM

## 2024-07-20 ENCOUNTER — Other Ambulatory Visit: Payer: Self-pay | Admitting: Hematology & Oncology

## 2024-07-20 DIAGNOSIS — R110 Nausea: Secondary | ICD-10-CM

## 2024-07-23 ENCOUNTER — Ambulatory Visit
Admission: RE | Admit: 2024-07-23 | Discharge: 2024-07-23 | Disposition: A | Discharge status: Home / Self Care | Source: Ambulatory Visit | Attending: Radiation Oncology | Admitting: Radiation Oncology

## 2024-07-23 DIAGNOSIS — D329 Benign neoplasm of meninges, unspecified: Secondary | ICD-10-CM

## 2024-07-23 MED ORDER — GADOPICLENOL 0.5 MMOL/ML IV SOLN
10.0000 mL | Freq: Once | INTRAVENOUS | Status: AC | PRN
Start: 2024-07-23 — End: 2024-07-23
  Administered 2024-07-23: 10 mL via INTRAVENOUS

## 2024-07-24 NOTE — Progress Notes (Incomplete)
 Has armband been applied?  {yes no:314532}  Does patient have an allergy to IV contrast dye?: No.   Has patient ever received premedication for IV contrast dye?: {yes no:314532}   Does patient take metformin ?: No.   Date of lab work: {Time; dates multiple:15870} BUN: 14 CR: 0.89 eGfr: >60  IV site: {iv locations:314275}  Has IV site been added to flowsheet?  {yes no:314532}  There were no vitals taken for this visit.

## 2024-07-25 ENCOUNTER — Ambulatory Visit
Admission: RE | Admit: 2024-07-25 | Discharge: 2024-07-25 | Disposition: A | Discharge status: Home / Self Care | Source: Ambulatory Visit | Attending: Radiation Oncology | Admitting: Radiation Oncology

## 2024-07-25 ENCOUNTER — Other Ambulatory Visit: Payer: Self-pay | Admitting: Behavioral Health

## 2024-07-25 DIAGNOSIS — D320 Benign neoplasm of cerebral meninges: Secondary | ICD-10-CM | POA: Diagnosis not present

## 2024-07-25 DIAGNOSIS — D329 Benign neoplasm of meninges, unspecified: Secondary | ICD-10-CM | POA: Insufficient documentation

## 2024-07-25 DIAGNOSIS — F5105 Insomnia due to other mental disorder: Secondary | ICD-10-CM

## 2024-07-25 DIAGNOSIS — Z510 Encounter for antineoplastic radiation therapy: Secondary | ICD-10-CM | POA: Diagnosis present

## 2024-07-25 NOTE — Addendum Note (Signed)
 Encounter addended by: Claudene Eudora GAILS, LPN on: 89/04/7973 2:48 PM  Actions taken: Flowsheet accepted

## 2024-07-26 ENCOUNTER — Telehealth: Payer: Self-pay

## 2024-07-26 ENCOUNTER — Encounter: Payer: Self-pay | Admitting: Internal Medicine

## 2024-07-26 ENCOUNTER — Encounter: Payer: Self-pay | Admitting: Hematology & Oncology

## 2024-07-26 ENCOUNTER — Encounter: Payer: Self-pay | Admitting: Family Medicine

## 2024-07-26 NOTE — Telephone Encounter (Signed)
 Please advise, I was going to send message letting patients husband know to go to UC or ED for eval since she is having trouble walking/standing. Agreeable?

## 2024-07-26 NOTE — Telephone Encounter (Signed)
 Prior recommendations reviewed and agree with ER disposition.

## 2024-07-26 NOTE — Telephone Encounter (Signed)
 Called patient and relayed message- Patient notes had a MRI on Tuesday and saw Dr Karrie for radiation for benign meningeoma that has been slow growing, notes Dr Karrie did a good check over and deemed nothing concerning, notes she has been able to walk around with walker or husband assistance but went to Dr office in wheel chair yesterday, had bright red blood in the toilet last night feels this was just a hemorrhoid, notes she doesn't really want to go to ER unless she has to and asked for guidance on what to watch for in order to go to ER if necessary, advised patient that I cannot make this advisement as Dr Levora has already stated she needed to go to ED she stated she understands but she isn't going to go unless something severely worsens, notes BP is 161/101 and DEXCOM is just stating high not providing number but has appt with ENDO Monday morning, asked her if she had also seen note from Oncology stating she should go to ER as well and she notes she did see this but feels it isn't that bad, she will talk with her husband about going but at this time patient is declining ER evaluation at this time.

## 2024-07-26 NOTE — Telephone Encounter (Signed)
 Contact patient to schedule for appointment this coming Monday. We have several slots open . Can be a VV.

## 2024-07-26 NOTE — Telephone Encounter (Signed)
 I went ahead and sent a message to patient's husband to be seen through ED, especially if she is having severe vertigo, elevated blood pressure or blood sugar could be related.  Can we please call her/her spouse and let them know to be seen through ER today if possible? Thanks

## 2024-07-29 ENCOUNTER — Encounter: Payer: Self-pay | Admitting: Internal Medicine

## 2024-07-29 ENCOUNTER — Ambulatory Visit (INDEPENDENT_AMBULATORY_CARE_PROVIDER_SITE_OTHER): Admitting: Internal Medicine

## 2024-07-29 VITALS — BP 144/80 | HR 76 | Ht 66.0 in | Wt 237.0 lb

## 2024-07-29 DIAGNOSIS — Z794 Long term (current) use of insulin: Secondary | ICD-10-CM | POA: Diagnosis not present

## 2024-07-29 DIAGNOSIS — R7989 Other specified abnormal findings of blood chemistry: Secondary | ICD-10-CM | POA: Diagnosis not present

## 2024-07-29 DIAGNOSIS — E041 Nontoxic single thyroid nodule: Secondary | ICD-10-CM

## 2024-07-29 DIAGNOSIS — D352 Benign neoplasm of pituitary gland: Secondary | ICD-10-CM | POA: Diagnosis not present

## 2024-07-29 DIAGNOSIS — E1165 Type 2 diabetes mellitus with hyperglycemia: Secondary | ICD-10-CM | POA: Diagnosis not present

## 2024-07-29 LAB — POCT GLYCOSYLATED HEMOGLOBIN (HGB A1C): Hemoglobin A1C: 10 % — AB (ref 4.0–5.6)

## 2024-07-29 MED ORDER — NOVOLOG FLEXPEN 100 UNIT/ML ~~LOC~~ SOPN: PEN_INJECTOR | SUBCUTANEOUS | 3 refills | Status: DC

## 2024-07-29 MED ORDER — LANTUS SOLOSTAR 100 UNIT/ML ~~LOC~~ SOPN: 40.0000 [IU] | PEN_INJECTOR | Freq: Every day | SUBCUTANEOUS | 4 refills | Status: DC

## 2024-07-29 MED ORDER — INSULIN PEN NEEDLE 32G X 4 MM MISC: 1.0000 | Freq: Four times a day (QID) | 3 refills | Status: DC

## 2024-07-29 NOTE — Patient Instructions (Addendum)
 Stop Glipizide   Increase Lantus  40 units daily Start NovoLog  10 units with each meal Novolog  correctional insulin : ADD extra units on insulin  to your meal-time Novolog  dose if your blood sugars are higher than 155. Use the scale below to help guide you:   Blood sugar before meal Number of units to inject  Less than 155 0 unit  156 -  180 1 units  181 -  205 2 units  206 -  230 3 units  231 -  255 4 units  256 -  280 5 units  281 -  305 6 units  306 -  330 7 units  331 -  355 8 units  356 - 380 9 units  381- 405 10 units       HOW TO TREAT LOW BLOOD SUGARS (Blood sugar LESS THAN 70 MG/DL) Please follow the RULE OF 15 for the treatment of hypoglycemia treatment (when your (blood sugars are less than 70 mg/dL)   STEP 1: Take 15 grams of carbohydrates when your blood sugar is low, which includes:  3-4 GLUCOSE TABS  OR 3-4 OZ OF JUICE OR REGULAR SODA OR ONE TUBE OF GLUCOSE GEL    STEP 2: RECHECK blood sugar in 15 MINUTES STEP 3: If your blood sugar is still low at the 15 minute recheck --> then, go back to STEP 1 and treat AGAIN with another 15 grams of carbohydrates.

## 2024-07-29 NOTE — Progress Notes (Signed)
 Name: Wendy Barber  MRN/ DOB: [id number], 01/05/1961    Age/ Sex: 63 y.o., female    PCP: [name], MD   Reason for Endocrinology Evaluation: Pituitary adenoma     Date of Initial Endocrinology Evaluation: 04/17/2023    HPI: [name] is a 63 y.o. female with a past medical history of HTN, CAD, Hx DVT/PE, Cancer of appendic with mets , autoimmune hepatitis and DM. The patient presented for initial endocrinology clinic visit on 04/17/2023 for consultative assistance with her pituitary adenoma.   During evaluation by neurology for vertigo and cerebral meningioma she was noted to have a pituitary adenoma on brain MRI 2023    Patient follows with oncology for history of metastatic appendiceal cancer, s/p surgery 2022    Patient had an incidental finding of thyroid  nodule on CT scan which prompted thyroid  ultrasound 11/2022, revealing a right inferior 2.3 cm nodule meeting follow-up criteria.  S/p benign FNA of the right inferior nodule 02/27/2024  S/P hysterectomy 2004 , no HRT     Pituitary labs showed elevated ACTH  at 76.3 pg/mL ( 7.2-63.3) , 24 hr urine cortisol was normal , she also had elevated Prolactin 49.9 ng/dL ),   IGF -1 was normal as well as TFT's.   Started cabergoline  05/2023 with a prolactin of 49.9 NG/mL  24-hour urinary cortisol normal at 9.3 on 01/23/2024, serum cortisol normal at 15.6 mcg   DM HISTORY: She has been diagnosed with DM many years ago, she was on insulin  at some point ( basal/prandial )which was discontinued in 2021, prior to cancer surgery.   Her BG's have remained stable until 2024 when she was noted with hypoglycemia again.   She was started on glimepiride  by her oncologist in 2024 I switch from glimepiride  to glipizide  12/2023   Started basal insulin  July, 2025 with an A1c of 9.3%  SUBJECTIVE:      Today (07/29/24):  [name] is here for follow-up on persistent hyperglycemia.  The patient has been noted with  vertigo as well as BG readings >200 mg/dL despite being on increasing doses of basal insulin .  Patient checks glucose multiple times daily through the Dexcom Patient also uses glucose checks and has been noted with lower BG's on the glucose meter then on the Dexcom  Since her last visit here she has had a follow-up with ophthalmology  She has been following up at [facility] for malignant pseudomyxoma peritonei  She continues to follow-up with oncology, she received her last dose of lanreotide The patient is leaning towards colostomy, no definite date for the surgery  Continues with vertigo, is stable with nausea  She will start radiation for the meningioma, has severe  headaches for multiple days that has been associated with vertigo , she takes dilaudid  without help with the headaches  Continues with diarrhea  Patient does have chronic left foot numbness/sensitivity following left Achilles tendon surgery  HOME DIABETES REGIMEN:  Glipizide  5 mg twice daily Lantus  36 units daily   Statin: no ACE-I/ARB: no Prior Diabetic Education: yes   CONTINUOUS GLUCOSE MONITORING RECORD INTERPRETATION    Dates of Recording: 9/23-10/03/2024  Sensor description: dexcom  Results statistics:   CGM use % of time 92  Average and SD 322/61  Time in range      0  %  % Time Above 180 16  % Time above 250 84  % Time Below target 0   Glycemic patterns summary: BGs are high throughout the  day and night  Hyperglycemic episodes all day and night  Hypoglycemic episodes occurred N/A  Overnight periods: High     DIABETIC COMPLICATIONS: Microvascular complications:   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 Denies: CKD Last Eye Exam: Completed 07/08/2024  Macrovascular complications:   Denies: CAD, CVA, PVD     HISTORY:  Past Medical History:  Past Medical History:  Diagnosis Date   Arthritis    Autoimmune hepatitis (HCC)    Back pain    Cancer (HCC)    pseudomyxoma  peritonei, liver   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   Cardiomegaly 12/08/2022   without congestive failure, noted on ECHO   Cerebral meningioma (HCC)    Chronic diarrhea    Chronic fatigue syndrome    Diabetes mellitus without complication (HCC)    DVT of deep femoral vein, left (HCC) 03/19/2020   Fibromyalgia    GERD (gastroesophageal reflux disease)    Goals of care, counseling/discussion 03/19/2020   History of blood transfusion    Hypertension    Incomplete right bundle branch block (RBBB) 12/08/2023   Noted on EKG   Iron deficiency anemia due to chronic blood loss 05/14/2021   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   Migraines    Multiple thyroid  nodules    Pernicious anemia 05/14/2021   Pituitary adenoma (HCC)    PONV (postoperative nausea and vomiting)    not with recent surgeries   Presence of IVC filter 03/19/2020   Pulmonary embolism, bilateral (HCC) 03/19/2020   Rectal bleed 12/29/2023   Short bowel syndrome, unspecified    Vertigo    Past Surgical History:  Past Surgical History:  Procedure Laterality Date   ABDOMINAL SURGERY  2005   cytoreductive surgery with splenectomy, [facility]   ABDOMINAL SURGERY  2022   cytoreductive surgery with splenectomy, [facility]   ACHILLES TENDON REPAIR Bilateral    ARTHROPLASTY Bilateral    Shoulder   BIOPSY THYROID      CARPAL TUNNEL RELEASE Bilateral    possibly right x2   CHOLECYSTECTOMY  07/1994   CYSTOSCOPY/URETEROSCOPY/HOLMIUM LASER/STENT PLACEMENT Left 03/26/2024   Procedure: CYSTOSCOPY/URETEROSCOPY/HOLMIUM LASER/STENT PLACEMENT;  Surgeon: Elisabeth Valli BIRCH, MD;  Location: [facility];  Service: Urology;  Laterality: Left;  CYSTOSCOPY/LEFT URETEROSCOPY/HOLMIUM LASER/STENT PLACEMENT/RETROGRADE PYELOGRAM   IR CATHETER TUBE CHANGE  02/02/2021   IR CATHETER TUBE CHANGE  02/25/2021   IR IMAGING GUIDED PORT INSERTION  06/20/2022   IR RADIOLOGIST EVAL & MGMT  02/24/2021   IR RADIOLOGIST  EVAL & MGMT  03/10/2021   IR RADIOLOGIST EVAL & MGMT  06/22/2022   IR THROMBECT VENO MECH MOD SED  06/20/2022   IR US  GUIDE BX ASP/DRAIN  11/25/2019   IR US  GUIDE VASC ACCESS LEFT  06/20/2022   IR US  GUIDE VASC ACCESS RIGHT  06/20/2022   IR US  GUIDE VASC ACCESS RIGHT  06/20/2022   IR VENO/EXT/BI  06/20/2022   IR VENOCAVAGRAM IVC  06/20/2022   JOINT REPLACEMENT Bilateral    knees   KNEE ARTHROSCOPY     ORIF ANKLE FRACTURE Right 08/27/2019   Procedure: OPEN REDUCTION INTERNAL FIXATION RIGHT ANKLE FRACTURE;  Surgeon: Beverley Evalene BIRCH, MD;  Location: [facility];  Service: Orthopedics;  Laterality: Right;   perineorrophy     SPINAL FUSION     TARSAL TUNNEL RELEASE Right    TONGUE BIOPSY     TOTAL ABDOMINAL HYSTERECTOMY Bilateral 2004   with BSO and appendectomy    Social History:  reports that she has  never smoked. She has never used smokeless tobacco. She reports that she does not drink alcohol and does not use drugs. Family History: family history includes Alcohol abuse in her brother; Depression in her mother; Drug abuse in her brother and sister; Suicidality in her sister.   HOME MEDICATIONS: Allergies as of 07/29/2024       Reactions   Ativan [lorazepam] Swelling, Other (See Comments)   Face & Throat Swelling  Note: tolerates midazolam  fine   Corticosteroids Other (See Comments)   Psychotic behavior   Penicillins Shortness Of Breath, Other (See Comments)   Irregular and rapid Heart Rate, too   Alprazolam Hives, Other (See Comments)   Hard to arouse, unresponsiveness also   Erythromycin Nausea And Vomiting      Gabapentin  Other (See Comments)   Made the patient feel depressed   Prednisolone Anxiety   Prednisone Anxiety, Other (See Comments)   Anxiety & Nervous Breakdown   Savella  [milnacipran] Other (See Comments)   Reaction not noted        Medication List        Accurate as of July 29, 2024 11:58 AM. If you have any questions, ask your nurse or doctor.           acetaminophen  500 MG tablet Commonly known as: TYLENOL  Take 1,000 mg by mouth every 6 (six) hours as needed for moderate pain (pain score 4-6).   amLODipine  5 MG tablet Commonly known as: NORVASC  TAKE 1 TABLET(5 MG) BY MOUTH DAILY   busPIRone  15 MG tablet Commonly known as: BUSPAR  Take 1 tablet (15 mg total) by mouth 3 (three) times daily.   ciprofloxacin  500 MG tablet Commonly known as: Cipro  Take 1 tablet (500 mg total) by mouth 2 (two) times daily.   citalopram  40 MG tablet Commonly known as: CELEXA  Take 1 tablet (40 mg total) by mouth daily.   cyclobenzaprine  10 MG tablet Commonly known as: FLEXERIL  TAKE 1/2 TO 1 TABLET(5 TO 10 MG) BY MOUTH THREE TIMES DAILY AS NEEDED FOR MUSCLE SPASMS. START EVERY NIGHT AT BEDTIME AS NEEDED DUE TO SEDATION   Dexcom G7 Sensor Misc 1 Device by Does not apply route as directed.   diazepam  10 MG tablet Commonly known as: VALIUM  Take one pill as needed for procedures including radiation simulation scan, MRI for radiation planning, and radiation treatment   dicyclomine  20 MG tablet Commonly known as: BENTYL  TAKE 1 TABLET(20 MG) BY MOUTH THREE TIMES DAILY BEFORE MEALS   diphenoxylate -atropine  2.5-0.025 MG tablet Commonly known as: LOMOTIL  TAKE 2 TABLETS BY MOUTH FOUR TIMES DAILY AS NEEDED FOR LOOSE STOOLS OR DIARRHEA   eszopiclone  2 MG Tabs tablet Commonly known as: LUNESTA  TAKE 1 TABLET(2 MG) BY MOUTH AT BEDTIME AS NEEDED FOR SLEEP   famotidine  20 MG tablet Commonly known as: PEPCID  TAKE 1 TABLET(20 MG) BY MOUTH TWICE DAILY   fondaparinux  10 MG/0.8ML Soln injection Commonly known as: Arixtra  Inject 0.8 mLs (10 mg total) into the skin daily.   glipiZIDE  5 MG tablet Commonly known as: GLUCOTROL  Take 1 tablet (5 mg total) by mouth 2 (two) times daily before a meal.   hydrocortisone  2.5 % rectal cream Commonly known as: ANUSOL -HC PLACE RECTALLY 4 TIMES DAILY AS NEEDED FOR HEMORRHOIDS OR ANAL ITCHING   HYDROmorphone  4 MG  tablet Commonly known as: Dilaudid  Take 1 tablet (4 mg total) by mouth every 6 (six) hours as needed for severe pain (pain score 7-10).   Insulin  Pen Needle 32G X 4 MM Misc  1 Device by Does not apply route daily in the afternoon.   Lantus  SoloStar 100 UNIT/ML Solostar Pen Generic drug: insulin  glargine Inject 30 Units into the skin daily.   lidocaine -hydrocortisone  3-1 % Kit Commonly known as: ANAMANTLE Place 1 Application rectally 2 (two) times daily.   loperamide  2 MG capsule Commonly known as: IMODIUM  Take 2 capsules (4 mg total) by mouth 4 (four) times daily as needed for diarrhea or loose stools.   meclizine  25 MG tablet Commonly known as: ANTIVERT  TAKE 1 TABLET(25 MG) BY MOUTH THREE TIMES DAILY AS NEEDED FOR DIZZINESS   mirtazapine  15 MG tablet Commonly known as: REMERON  Take 1 tablet (15 mg total) by mouth at bedtime.   ondansetron  8 MG tablet Commonly known as: ZOFRAN  TAKE 1 TABLET(8 MG) BY MOUTH EVERY 8 HOURS AS NEEDED FOR NAUSEA OR VOMITING   orphenadrine  100 MG tablet Commonly known as: NORFLEX  TAKE 1 TABLET(100 MG) BY MOUTH AT BEDTIME AS NEEDED FOR MUSCLE SPASMS   pantoprazole  40 MG tablet Commonly known as: PROTONIX  TAKE 1 TABLET BY MOUTH EVERY DAY BEFORE BREAKFAST   pramoxine-hydrocortisone  1-1 % rectal cream Commonly known as: PROCTOCREAM-HC APPLY RECTALLY TO THE AFFECTED AREA TWICE DAILY   promethazine  12.5 MG tablet Commonly known as: PHENERGAN  TAKE 1 TABLET(12.5 MG) BY MOUTH EVERY 6 HOURS AS NEEDED FOR NAUSEA OR VOMITING   propranolol  20 MG tablet Commonly known as: INDERAL  Take 1 tablet (20 mg total) by mouth 2 (two) times daily.   terconazole  0.4 % vaginal cream Commonly known as: TERAZOL 7  Apply topically twice daily for up to 7 days          REVIEW OF SYSTEMS: A comprehensive ROS was conducted with the patient and is negative except as per HPI     OBJECTIVE:  VS:BP (!) 144/80 (BP Location: Right Arm, Patient Position: Sitting,  Cuff Size: Large)   Pulse 76   Ht 5' 6 (1.676 m)   Wt 237 lb (107.5 kg)   SpO2 97%   BMI 38.25 kg/m      Wt Readings from Last 3 Encounters:  07/29/24 237 lb (107.5 kg)  07/25/24 (P) 238 lb 8 oz (108.2 kg)  07/04/24 239 lb 12.8 oz (108.8 kg)     EXAM: General: Pt appears well and is in NAD  Neck: General: Supple without adenopathy. Thyroid : Thyroid  size normal.  No goiter or nodules appreciated.  Lungs: Clear with good BS bilat   Heart: Auscultation: RRR.  Abdomen: Soft, nontender  Extremities:  BL LE: No pretibial edema   Mental Status: Judgment, insight: Intact Orientation: Oriented to time, place, and person Mood and affect: No depression, anxiety, or agitation     DATA REVIEWED:   Latest Reference Range & Units 07/04/24 14:50  Sodium 135 - 145 mmol/L 140  Potassium 3.5 - 5.1 mmol/L 3.9  Chloride 98 - 111 mmol/L 102  CO2 22 - 32 mmol/L 26  Glucose 70 - 99 mg/dL 587 (H)  BUN 8 - 23 mg/dL 14  Creatinine 9.55 - 8.99 mg/dL 9.10  Calcium  8.9 - 10.3 mg/dL 8.9  Anion gap 5 - 15  12  Magnesium  1.7 - 2.4 mg/dL 1.6 (L)  Alkaline Phosphatase 38 - 126 U/L 249 (H)  Albumin 3.5 - 5.0 g/dL 4.0  AST 15 - 41 U/L 68 (H)  ALT 0 - 44 U/L 76 (H)  Total Protein 6.5 - 8.1 g/dL 7.1  Total Bilirubin 0.0 - 1.2 mg/dL 0.4  GFR, Est Non African American >60  mL/min >60     Latest Reference Range & Units 04/17/23 12:17  Insulin -Like GF-1 60 - 207 ng/mL 92  FSH 25.8 - 134.8 mIU/mL 21.8 (L)  Prolactin 3.6 - 25.2 ng/mL 49.9 (H)  Glucose 70 - 99 mg/dL 859 (H)     MRI brain 5/81/7974   Brain: No abnormality affects the brainstem. There is continued enlargement of a meningioma associated with the medial aspect of the left tentorial leaflet with a broad surface along the medial left transverse sinus and the torcula. Maximal dimension in the sagittal plane today measures 22 x 19 mm compared with 22 x 15 mm in August of 2024 and 21 x 13 mm in September of 2023.   Cerebral  hemispheres show a very few punctate foci of T2 and FLAIR signal in the white matter, not likely significant. There is not a pattern of widespread or significant small-vessel disease. No cortical or large vessel territory stroke. No hydrocephalus. No extra-axial fluid collection.   Within the central to left side of the pituitary gland, there is a macro adenoma measuring today 15 x 12 x 10 mm. When measured using the same technique, this is the same size. Small nonenhancing cystic component inferiorly again noted. No definite cavernous sinus invasion.   Vascular: Major vessels at the base of the brain show flow.   Skull and upper cervical spine: Negative   Sinuses/Orbits: Clear/normal   Other: None   IMPRESSION: 1. Continued enlargement of a meningioma associated with the medial aspect of the left tentorial leaflet with a broad surface along the medial left transverse sinus and the torcula. Maximal dimension in the sagittal plane today measures 22 x 19 mm compared with 22 x 15 mm in August of 2024 and 21 x 13 mm in September of 2023. 2. Stable pituitary macro adenoma measuring 15 x 12 x 10 mm.       Thyroid  Ultrasound 02/07/2024 Estimated total number of nodules >/= 1 cm: 1   Number of spongiform nodules >/=  2 cm not described below (TR1): 0   Number of mixed cystic and solid nodules >/= 1.5 cm not described below (TR2): 0   _________________________________________________________   Nodule # 1: Changing morphology and appearance of the nodule in the right lower gland. On today's exam, the nodule is more solid and hypoechoic and demonstrates taller than wide morphology in the transverse view. At 1.6 x 1.1 x 1.4 cm, this TI-RADS category 4 nodule now meets criteria for biopsy. **Given size (>/= 1.5 cm) and appearance, fine needle aspiration of this moderately suspicious nodule should be considered based on TI-RADS criteria.   IMPRESSION: Changing morphology of the  nodule in the right inferior gland which is now considered TI-RADS category 4 and meets criteria for biopsy. Biopsy is recommended.  FNA right inferior nodule 02/27/2024   Clinical History: Changing morphology and appearance of the nodule in  the right lower gland.  On todays exam, the nodule is more solid and  hypoechoic and demonstrates taller than wide morphology in the  transverse view.  At 1.6 x 1.1 x 1.4cm TI-RADS - 4  Specimen Submitted:  A. THYROID , RIGHT LOWER, FINE NEEDL0E ASPIRATION:    FINAL MICROSCOPIC DIAGNOSIS:  - Benign follicular nodule (Bethesda category II)    In office BG 304 mg/dL     ASSESSMENT/PLAN/RECOMMENDATIONS:   1) Type 2 Diabetes Mellitus, Poorly controlled, With neuropathic  complications - Most recent A1c of 10. %. Goal A1c < 7.0 %.    -  Patient continues with poorly controlled diabetes - Not a candidate for GLP-1 agonist due to chronic diarrhea - I have recommended discontinuing glipizide  and start her on prandial dose of insulin , she will be provided a correction scale as well - Basal insulin  will be increased as below    MEDICATIONS: Stop glipizide  Increase Lantus  40 units daily Start NovoLog  10 units with each meal Start CF: NovoLog  (BG -130/25) TIDQAC  EDUCATION / INSTRUCTIONS: BG monitoring instructions: Patient is instructed to check her blood sugars 3 times a day. Call [facility] if: BG persistently < 70  I reviewed the Rule of 15 for the treatment of hypoglycemia in detail with the patient. Literature supplied.   2.Pituitary Macroadenoma:    -Pituitary MRI showed 11 mm pituitary adenoma, no invasion or mass effect on adjacent structures - Repeat MRI showed stable pituitary adenoma ,but Increase size of meningioma, neurosurgery didn't recommend sx as the risk outweighs the risk  - Historically FSH is inappropriately normal, IGF- I  normal    3.  Hyperprolactinemia:   -It is difficult to ascertain if this is  due to prolactinoma or stalk effect - Due to low prolactin, cabergoline  was discontinued in July, 2025    4. Elevated ACTH :   -This has been slightly elevated with normal 24-hour urinary cortisol in 2024 and 2025 -Differential diagnosis includes paradoxical effect from somatostatin intake, Magod-ACTH , ectopic ACTH  secretion     4.  Right thyroid  nodule:   -No local neck symptoms -TFTs are normal - S/p benign FNA 02/2024    F/U in 3 months   I spent 49 minutes preparing to see the patient by review of recent labs, imaging and procedures, obtaining and reviewing separately obtained history, communicating with the patient/family or caregiver, ordering medications, tests or procedures, and documenting clinical information in the EHR including the differential Dx, treatment, and any further evaluation and other management    Signed electronically by: Stefano Redgie Butts, MD  [facility]  [facility] [address] [place] [place], [place] [address] Phone: [phone] FAX: [phone]   CC: [name], MD [id number] [place] [address] Phone: [phone] Fax: [phone]   Return to Endocrinology clinic as below: Future Appointments  Date Time Provider Department Center  08/01/2024 12:20 PM Maritza Stagger, MD [facility] None  08/14/2024  2:20 PM [facility] ANNUAL WELLNESS VISIT [facility] [place]  08/22/2024  1:15 PM [facility] LAB [facility] None  08/22/2024  1:30 PM [facility] INJ NURSE [facility] None  08/22/2024  1:45 PM Timmy Maude SAUNDERS, MD [facility] None  09/02/2024  9:00 AM Maritza Stagger, MD [facility] None  11/13/2024  1:00 PM Teresa Redell LABOR, NP [facility] None  07/08/2025 12:45 PM Valdemar Redell, MD [facility] None

## 2024-07-31 DIAGNOSIS — Z510 Encounter for antineoplastic radiation therapy: Secondary | ICD-10-CM | POA: Diagnosis not present

## 2024-08-01 ENCOUNTER — Emergency Department (HOSPITAL_COMMUNITY)
Admission: EM | Admit: 2024-08-01 | Discharge: 2024-08-01 | Discharge status: Against Medical Advice | Source: Ambulatory Visit

## 2024-08-01 ENCOUNTER — Other Ambulatory Visit: Payer: Self-pay

## 2024-08-01 ENCOUNTER — Ambulatory Visit
Admission: RE | Admit: 2024-08-01 | Discharge: 2024-08-01 | Disposition: A | Discharge status: Home / Self Care | Source: Ambulatory Visit | Attending: Radiation Oncology | Admitting: Radiation Oncology

## 2024-08-01 DIAGNOSIS — Z510 Encounter for antineoplastic radiation therapy: Secondary | ICD-10-CM | POA: Diagnosis not present

## 2024-08-01 LAB — RAD ONC ARIA SESSION SUMMARY
Course Elapsed Days: 0
Plan Fractions Treated to Date: 1
Plan Prescribed Dose Per Fraction: 13 Gy
Plan Total Fractions Prescribed: 1
Plan Total Prescribed Dose: 13 Gy
Reference Point Dosage Given to Date: 13 Gy
Reference Point Session Dosage Given: 12.0767 Gy
Session Number: 1

## 2024-08-01 NOTE — Progress Notes (Signed)
 Wendy Barber rested with us  for 30 minutes following her SRS treatment.  Patient denies headache, dizziness, nausea, diplopia or ringing in the ears. Denies fatigue. Patient without complaints. Understands to avoid strenuous activity for the next 24 hours and call [phone] with needs.  Patient's BP was elevated and has been for a few days.  Per MD recommendation, patient was transported to the ER by her husband.  BP (!) 167/112 (BP Location: Right Arm)   Pulse 70   Temp 97.8 F (36.6 C) (Temporal)   Resp 20   SpO2 96%

## 2024-08-01 NOTE — Progress Notes (Signed)
    08/01/2024    1:35 PM 08/01/2024    1:30 PM  Vitals with BMI  Height    Weight    BMI    Systolic 167 203  Diastolic 112 138  Pulse  70    Wendy Barber was seen after [facility] treatment today and noted to have elevated blood pressures. Of note, she had elevated blood pressures last week as well and her PCP instructed her to go to the ER at that time but she did not end up going. Blood pressures today are even higher with the first recorded BP of 203/138. She is asymptomatic however, we requested patient go to the ER for further evaluation and management and she and her husband were in agreement with plan.

## 2024-08-02 ENCOUNTER — Other Ambulatory Visit: Payer: Self-pay | Admitting: Radiation Therapy

## 2024-08-02 ENCOUNTER — Encounter: Payer: Self-pay | Admitting: Family Medicine

## 2024-08-02 ENCOUNTER — Encounter: Payer: Self-pay | Admitting: Hematology & Oncology

## 2024-08-02 ENCOUNTER — Other Ambulatory Visit: Payer: Self-pay | Admitting: Radiation Oncology

## 2024-08-02 ENCOUNTER — Other Ambulatory Visit: Payer: Self-pay | Admitting: Family Medicine

## 2024-08-02 ENCOUNTER — Other Ambulatory Visit: Payer: Self-pay | Admitting: Hematology & Oncology

## 2024-08-02 DIAGNOSIS — R12 Heartburn: Secondary | ICD-10-CM

## 2024-08-02 DIAGNOSIS — D329 Benign neoplasm of meninges, unspecified: Secondary | ICD-10-CM

## 2024-08-02 NOTE — Radiation Completion Notes (Signed)
 Patient Name: Wendy Barber, Wendy Barber MRN: [id number] Date of Birth: 1960-11-16 Referring Physician: GERLDINE MAIZES, M.D. Date of Service: 2024-08-02 Radiation Oncologist: Estefana Cha, M.D.  Cancer [facility]                             RADIATION ONCOLOGY END OF TREATMENT NOTE     Diagnosis: D32.0 Benign neoplasm of cerebral meninges Intent: Curative     ==========DELIVERED PLANS==========  First Treatment Date: 2024-08-01 Last Treatment Date: 2024-08-01   Plan Name: Brain_SRS Site: Brain Technique: SBRT/SRT-IMRT Mode: Photon Dose Per Fraction: 13 Gy Prescribed Dose (Delivered / Prescribed): 13 Gy / 13 Gy Prescribed Fxs (Delivered / Prescribed): 1 / 1     ==========ON TREATMENT VISIT DATES========== 2024-08-01     ==========UPCOMING VISITS========== 10/29/2024 [facility] OFFICE VISIT SPECIALTY [name], Donell Cardinal, MD  08/26/2024 [facility] ONC FOLLOW UP 30 Cha Estefana, MD  08/22/2024 [facility] EST PT 15 Timmy Maude SAUNDERS, MD  08/22/2024 [facility] PORT FLUSH W/LAB [facility] INJ NURSE  08/22/2024 [facility] LAB [facility] LAB  08/14/2024 [facility] ANNUAL WELL VISIT, INITIAL [facility] ANNUAL WELLNESS VISIT        ==========APPENDIX - ON TREATMENT VISIT NOTES==========   See weekly On Treatment Notes in Epic for details in the Media tab (listed as Progress notes on the On Treatment Visit Dates listed above).

## 2024-08-02 NOTE — Telephone Encounter (Signed)
 Called to check on pt after having completed her SRS treatment. There was no answer, so I left a detailed message with her future appointment information and a request to call back with any questions she may have.   Devere Perch R.T(R)(T) Radiation Special Procedures Lead

## 2024-08-02 NOTE — Telephone Encounter (Signed)
 Patient doesn't need to come in does she?

## 2024-08-07 ENCOUNTER — Other Ambulatory Visit: Payer: Self-pay | Admitting: Radiation Oncology

## 2024-08-07 MED ORDER — DIAZEPAM 10 MG PO TABS: ORAL_TABLET | ORAL | 0 refills | Status: DC

## 2024-08-12 ENCOUNTER — Other Ambulatory Visit: Payer: Self-pay | Admitting: *Deleted

## 2024-08-12 MED ORDER — FONDAPARINUX SODIUM 10 MG/0.8ML ~~LOC~~ SOLN: 10.0000 mg | SUBCUTANEOUS | 3 refills | Status: AC

## 2024-08-13 ENCOUNTER — Other Ambulatory Visit: Payer: Self-pay | Admitting: Hematology & Oncology

## 2024-08-13 ENCOUNTER — Other Ambulatory Visit: Payer: Self-pay

## 2024-08-13 DIAGNOSIS — H8112 Benign paroxysmal vertigo, left ear: Secondary | ICD-10-CM

## 2024-08-13 MED ORDER — LANTUS SOLOSTAR 100 UNIT/ML ~~LOC~~ SOPN: 40.0000 [IU] | PEN_INJECTOR | Freq: Every day | SUBCUTANEOUS | 4 refills | Status: AC

## 2024-08-13 MED ORDER — INSULIN PEN NEEDLE 32G X 4 MM MISC: 1.0000 | Freq: Four times a day (QID) | 3 refills | Status: AC

## 2024-08-13 MED ORDER — NOVOLOG FLEXPEN 100 UNIT/ML ~~LOC~~ SOPN: PEN_INJECTOR | SUBCUTANEOUS | 3 refills | Status: AC

## 2024-08-14 ENCOUNTER — Other Ambulatory Visit: Payer: Self-pay | Admitting: Family Medicine

## 2024-08-14 ENCOUNTER — Encounter: Payer: Self-pay | Admitting: Internal Medicine

## 2024-08-14 ENCOUNTER — Ambulatory Visit

## 2024-08-14 ENCOUNTER — Other Ambulatory Visit: Payer: Self-pay | Admitting: Hematology & Oncology

## 2024-08-14 DIAGNOSIS — M545 Low back pain, unspecified: Secondary | ICD-10-CM

## 2024-08-14 DIAGNOSIS — K649 Unspecified hemorrhoids: Secondary | ICD-10-CM

## 2024-08-15 ENCOUNTER — Other Ambulatory Visit: Payer: Self-pay

## 2024-08-15 ENCOUNTER — Encounter: Payer: Self-pay | Admitting: Hematology & Oncology

## 2024-08-15 DIAGNOSIS — E1165 Type 2 diabetes mellitus with hyperglycemia: Secondary | ICD-10-CM

## 2024-08-15 MED ORDER — EMBECTA PEN NEEDLE NANO 2 GEN 32G X 4 MM MISC: 1 refills | Status: AC

## 2024-08-15 NOTE — Telephone Encounter (Signed)
 Requested Prescriptions   Pending Prescriptions Disp Refills   cyclobenzaprine  (FLEXERIL ) 10 MG tablet [Pharmacy Med Name: CYCLOBENZAPRINE  10MG  TABLETS] 30 tablet 0    Sig: TAKE 1/2 TO 1 TABLET(5 TO 10 MG) BY MOUTH THREE TIMES DAILY AS NEEDED FOR MUSCLE SPASMS. START EVERY NIGHT AT BEDTIME AS NEEDED DUE TO SEDATION     Date of patient request: 08/15/2024 Last office visit: 04/29/2024 Upcoming visit: Visit date not found Date of last refill: 06/26/2024 Last refill amount: 30

## 2024-08-15 NOTE — Telephone Encounter (Signed)
 Requested Prescriptions   Pending Prescriptions Disp Refills   cyclobenzaprine  (FLEXERIL ) 10 MG tablet [Pharmacy Med Name: CYCLOBENZAPRINE  10MG  TABLETS] 30 tablet 0    Sig: TAKE 1/2 TO 1 TABLET(5 TO 10 MG) BY MOUTH THREE TIMES DAILY AS NEEDED FOR MUSCLE SPASMS. START EVERY NIGHT AT BEDTIME AS NEEDED DUE TO SEDATION     Date of patient request: 08/15/2024 Last office visit: 04/29/2024 Upcoming visit: 08/14/2024 Date of last refill: 06/26/2024 Last refill amount: 30

## 2024-08-16 ENCOUNTER — Telehealth: Admitting: Behavioral Health

## 2024-08-16 DIAGNOSIS — K7460 Unspecified cirrhosis of liver: Secondary | ICD-10-CM | POA: Insufficient documentation

## 2024-08-19 ENCOUNTER — Encounter: Payer: Self-pay | Admitting: Hematology & Oncology

## 2024-08-19 ENCOUNTER — Telehealth: Payer: Self-pay

## 2024-08-19 NOTE — Telephone Encounter (Signed)
 I think this patient has everything handled. Sending as FYI  Copied from CRM [id number]. Topic: Appointments - Appointment Scheduling >> Aug 19, 2024 11:11 AM Wendy Barber wrote: Patient was initially calling to see if she needs to schedule an appt. However, she told me that Dr. Levora told her that she does not need a pre-op appt if her specialist is going to do it for her, which was confirmed via MyChart.  Patient states she has a oncologist appt 10/30 and that this PCP can do the bloodwork and EKG for her.  States she is having high blood pressure and dizzy spells but states that this is being managed by another PCP already.

## 2024-08-20 ENCOUNTER — Ambulatory Visit: Admitting: Internal Medicine

## 2024-08-20 NOTE — Telephone Encounter (Signed)
 noted

## 2024-08-20 NOTE — Telephone Encounter (Addendum)
 Got it.  Yes, any specific requests or labs that are needed from [facility] would be helpful and we can work her into any appointment this week given the time constraints prior to surgery if that is needed.  Hopefully she will get some more information from her meeting with [facility] today as often they will perform their own preop evaluations.  Thanks.

## 2024-08-20 NOTE — Telephone Encounter (Signed)
 Appreciate the update.  However I do not see another primary care provider listed.  If she referring to her oncologist or other specialist?  Or does she have another primary care provider?

## 2024-08-20 NOTE — Op Note (Signed)
 Name: Wendy Barber    MRN: [id number]   Date: 08/01/2024    DOB: 09-Nov-1960   STEREOTACTIC RADIOSURGERY OPERATIVE NOTE  PRE-OPERATIVE DIAGNOSIS:  Left tentorial meningioma  POST-OPERATIVE DIAGNOSIS:  Same  PROCEDURE:  Stereotactic Radiosurgery  SURGEON:  Gerldine Maizes, MD  RADIATION ONCOLOGIST: Dr. Estefana Cha, MD  TECHNIQUE:  The patient underwent a radiation treatment planning session in the radiation oncology simulation suite under the care of the radiation oncology physician and physicist.  I participated closely in the radiation treatment planning afterwards. The patient underwent planning CT which was fused to 3T high resolution MRI with 1 mm axial slices.  These images were fused on the planning system.  We contoured the gross target volumes and subsequently expanded this to yield the Planning Target Volume. I actively participated in the planning process.  I helped to define and review the target contours and also the contours of the optic pathway, eyes, brainstem and selected nearby organs at risk.  All the dose constraints for critical structures were reviewed and compared to AAPM Task Group 101.  The prescription dose conformity was reviewed.  I approved the plan electronically.    Accordingly, Wendy Barber  was brought to the TrueBeam stereotactic radiation treatment linac and placed in the custom immobilization mask.  The patient was aligned according to surface markers with BrainLab Exactrac, then orthogonal x-rays were used in ExacTrac with the 6DOF robotic table and the shifts were made to align the patient  Wendy Barber received stereotactic radiosurgery to a prescription dose of 14Gy uneventfully.    The detailed description of the procedure is recorded in the radiation oncology procedure note.  I was present for the duration of the procedure.  DISPOSITION:   Following delivery, the patient was transported to nursing in stable condition and monitored for possible  acute effects to be discharged to home in stable condition with follow-up in one month.  Gerldine Maizes, MD [facility]

## 2024-08-20 NOTE — Telephone Encounter (Signed)
 Called patient to double check. It seems that she is confused as well. You are her only primary. She said that oncology cannot do her EKG and blood work and that would need to be ordered by you prior to her surgery next week. I asked patient if she has any paperwork stating what she needs. She said no. She has an appointment with [facility] today and will clarify what all she needs and call us  back to set up appt.

## 2024-08-21 ENCOUNTER — Other Ambulatory Visit: Payer: Self-pay

## 2024-08-21 DIAGNOSIS — D500 Iron deficiency anemia secondary to blood loss (chronic): Secondary | ICD-10-CM

## 2024-08-21 DIAGNOSIS — I82412 Acute embolism and thrombosis of left femoral vein: Secondary | ICD-10-CM

## 2024-08-21 DIAGNOSIS — C181 Malignant neoplasm of appendix: Secondary | ICD-10-CM

## 2024-08-21 DIAGNOSIS — I2699 Other pulmonary embolism without acute cor pulmonale: Secondary | ICD-10-CM

## 2024-08-21 DIAGNOSIS — D510 Vitamin B12 deficiency anemia due to intrinsic factor deficiency: Secondary | ICD-10-CM

## 2024-08-22 ENCOUNTER — Encounter: Payer: Self-pay | Admitting: Radiation Oncology

## 2024-08-22 ENCOUNTER — Inpatient Hospital Stay

## 2024-08-22 ENCOUNTER — Inpatient Hospital Stay: Attending: Hematology & Oncology

## 2024-08-22 ENCOUNTER — Inpatient Hospital Stay (HOSPITAL_BASED_OUTPATIENT_CLINIC_OR_DEPARTMENT_OTHER): Admitting: Hematology & Oncology

## 2024-08-22 VITALS — BP 137/76 | HR 87 | Temp 98.9°F | Resp 16 | Wt 238.8 lb

## 2024-08-22 VITALS — BP 123/78 | HR 72 | Resp 20

## 2024-08-22 DIAGNOSIS — Z888 Allergy status to other drugs, medicaments and biological substances status: Secondary | ICD-10-CM | POA: Insufficient documentation

## 2024-08-22 DIAGNOSIS — D3A8 Other benign neuroendocrine tumors: Secondary | ICD-10-CM | POA: Diagnosis not present

## 2024-08-22 DIAGNOSIS — Z880 Allergy status to penicillin: Secondary | ICD-10-CM | POA: Insufficient documentation

## 2024-08-22 DIAGNOSIS — C181 Malignant neoplasm of appendix: Secondary | ICD-10-CM | POA: Insufficient documentation

## 2024-08-22 DIAGNOSIS — R300 Dysuria: Secondary | ICD-10-CM

## 2024-08-22 DIAGNOSIS — D500 Iron deficiency anemia secondary to blood loss (chronic): Secondary | ICD-10-CM | POA: Diagnosis not present

## 2024-08-22 DIAGNOSIS — R109 Unspecified abdominal pain: Secondary | ICD-10-CM | POA: Insufficient documentation

## 2024-08-22 DIAGNOSIS — K55059 Acute (reversible) ischemia of intestine, part and extent unspecified: Secondary | ICD-10-CM | POA: Diagnosis not present

## 2024-08-22 DIAGNOSIS — D510 Vitamin B12 deficiency anemia due to intrinsic factor deficiency: Secondary | ICD-10-CM

## 2024-08-22 DIAGNOSIS — I82412 Acute embolism and thrombosis of left femoral vein: Secondary | ICD-10-CM

## 2024-08-22 DIAGNOSIS — I82401 Acute embolism and thrombosis of unspecified deep veins of right lower extremity: Secondary | ICD-10-CM | POA: Insufficient documentation

## 2024-08-22 DIAGNOSIS — K649 Unspecified hemorrhoids: Secondary | ICD-10-CM

## 2024-08-22 DIAGNOSIS — Z7901 Long term (current) use of anticoagulants: Secondary | ICD-10-CM | POA: Diagnosis not present

## 2024-08-22 DIAGNOSIS — Z85038 Personal history of other malignant neoplasm of large intestine: Secondary | ICD-10-CM

## 2024-08-22 DIAGNOSIS — H8112 Benign paroxysmal vertigo, left ear: Secondary | ICD-10-CM

## 2024-08-22 DIAGNOSIS — L905 Scar conditions and fibrosis of skin: Secondary | ICD-10-CM | POA: Insufficient documentation

## 2024-08-22 DIAGNOSIS — Z881 Allergy status to other antibiotic agents status: Secondary | ICD-10-CM | POA: Insufficient documentation

## 2024-08-22 DIAGNOSIS — Z79899 Other long term (current) drug therapy: Secondary | ICD-10-CM | POA: Insufficient documentation

## 2024-08-22 DIAGNOSIS — C772 Secondary and unspecified malignant neoplasm of intra-abdominal lymph nodes: Secondary | ICD-10-CM

## 2024-08-22 DIAGNOSIS — G8929 Other chronic pain: Secondary | ICD-10-CM | POA: Insufficient documentation

## 2024-08-22 DIAGNOSIS — R5383 Other fatigue: Secondary | ICD-10-CM | POA: Insufficient documentation

## 2024-08-22 DIAGNOSIS — I2699 Other pulmonary embolism without acute cor pulmonale: Secondary | ICD-10-CM

## 2024-08-22 DIAGNOSIS — M549 Dorsalgia, unspecified: Secondary | ICD-10-CM | POA: Diagnosis not present

## 2024-08-22 DIAGNOSIS — R110 Nausea: Secondary | ICD-10-CM

## 2024-08-22 DIAGNOSIS — A0472 Enterocolitis due to Clostridium difficile, not specified as recurrent: Secondary | ICD-10-CM

## 2024-08-22 LAB — CMP (CANCER CENTER ONLY)
ALT: 37 U/L (ref 0–44)
AST: 36 U/L (ref 15–41)
Albumin: 3.9 g/dL (ref 3.5–5.0)
Alkaline Phosphatase: 204 U/L — ABNORMAL HIGH (ref 38–126)
Anion gap: 10 (ref 5–15)
BUN: 17 mg/dL (ref 8–23)
CO2: 23 mmol/L (ref 22–32)
Calcium: 8.9 mg/dL (ref 8.9–10.3)
Chloride: 110 mmol/L (ref 98–111)
Creatinine: 0.87 mg/dL (ref 0.44–1.00)
GFR, Estimated: >60 mL/min (ref >60)
Glucose, Bld: 144 mg/dL — ABNORMAL HIGH (ref 70–99)
Potassium: 3.7 mmol/L (ref 3.5–5.1)
Sodium: 143 mmol/L (ref 135–145)
Total Bilirubin: 0.5 mg/dL (ref 0.0–1.2)
Total Protein: 7.3 g/dL (ref 6.5–8.1)

## 2024-08-22 LAB — IRON AND IRON BINDING CAPACITY (CC-WL,HP ONLY)
Iron: 109 ug/dL (ref 28–170)
Saturation Ratios: 36 % — ABNORMAL HIGH (ref 10.4–31.8)
TIBC: 301 ug/dL (ref 250–450)
UIBC: 192 ug/dL

## 2024-08-22 LAB — CBC WITH DIFFERENTIAL (CANCER CENTER ONLY)
Abs Immature Granulocytes: 0.03 K/uL (ref 0.00–0.07)
Basophils Absolute: 0.1 K/uL (ref 0.0–0.1)
Basophils Relative: 1 %
Eosinophils Absolute: 0.3 K/uL (ref 0.0–0.5)
Eosinophils Relative: 3 %
HCT: 40 % (ref 36.0–46.0)
Hemoglobin: 13.1 g/dL (ref 12.0–15.0)
Immature Granulocytes: 0 %
Lymphocytes Relative: 44 %
Lymphs Abs: 4.2 K/uL — ABNORMAL HIGH (ref 0.7–4.0)
MCH: 31.5 pg (ref 26.0–34.0)
MCHC: 32.8 g/dL (ref 30.0–36.0)
MCV: 96.2 fL (ref 80.0–100.0)
Monocytes Absolute: 0.8 K/uL (ref 0.1–1.0)
Monocytes Relative: 8 %
Neutro Abs: 4.2 K/uL (ref 1.7–7.7)
Neutrophils Relative %: 44 %
Platelet Count: 168 K/uL (ref 150–400)
RBC: 4.16 MIL/uL (ref 3.87–5.11)
RDW: 14.9 % (ref 11.5–15.5)
WBC Count: 9.7 K/uL (ref 4.0–10.5)
nRBC: 0 % (ref 0.0–0.2)

## 2024-08-22 LAB — VITAMIN B12: Vitamin B-12: 214 pg/mL (ref 180–914)

## 2024-08-22 LAB — FERRITIN: Ferritin: 189 ng/mL (ref 11–307)

## 2024-08-22 LAB — MAGNESIUM: Magnesium: 1.7 mg/dL (ref 1.7–2.4)

## 2024-08-22 MED ORDER — MAGNESIUM SULFATE 2 GM/50ML IV SOLN
2.0000 g | Freq: Once | INTRAVENOUS | Status: AC
  Administered 2024-08-22: 2 g via INTRAVENOUS
  Filled 2024-08-22: qty 50

## 2024-08-22 MED ORDER — SODIUM CHLORIDE 0.9 % IV SOLN: INTRAVENOUS | Status: DC

## 2024-08-22 MED ORDER — SODIUM CHLORIDE 0.9 % IV SOLN: 2.0000 g | Freq: Once | INTRAVENOUS | Status: DC

## 2024-08-22 MED ORDER — MAGNESIUM SULFATE 2 GM/50ML IV SOLN: 2.0000 g | Freq: Once | INTRAVENOUS | Status: DC

## 2024-08-22 NOTE — Patient Instructions (Signed)
 Hypomagnesemia Hypomagnesemia is a condition in which the level of magnesium  in the blood is too low. Magnesium  is a mineral that is found in many foods. It is used in many different processes in the body. Hypomagnesemia can affect every organ in the body. In severe cases, it can cause life-threatening problems. What are the causes? This condition may be caused by: Not getting enough magnesium  in your diet or not having enough healthy foods to eat (malnutrition). Problems with magnesium  absorption in the intestines. Dehydration. Excessive use of alcohol. Vomiting. Severe or long-term (chronic) diarrhea. Some medicines, including medicines that make you urinate more often (diuretics). Certain diseases, such as kidney disease, diabetes, celiac disease, and overactive thyroid . What are the signs or symptoms? Symptoms of this condition include: Loss of appetite, nausea, and vomiting. Involuntary shaking or trembling of a body part (tremor). Muscle weakness or tingling in the arms and legs. Sudden tightening of muscles (muscle spasms). Confusion. Psychiatric issues, such as: Depression and irritability. Psychosis. A feeling of fluttering of the heart (palpitations). Seizures. These symptoms are more severe if magnesium  levels drop suddenly. How is this diagnosed? This condition may be diagnosed based on: Your symptoms and medical history. A physical exam. Blood and urine tests. How is this treated? Treatment depends on the cause and the severity of the condition. It may be treated by: Taking a magnesium  supplement. This can be taken in pill form. If the condition is severe, magnesium  is usually given through an IV. Making changes to your diet. You may be directed to eat foods that have a lot of magnesium , such as green leafy vegetables, peas, beans, and nuts. Not drinking alcohol. If you are struggling not to drink, ask your health care provider for help. Follow these instructions at  home: Eating and drinking     Make sure that your diet includes foods with magnesium . Foods that have a lot of magnesium  in them include: Green leafy vegetables, such as spinach and broccoli. Beans and peas. Nuts and seeds, such as almonds and sunflower seeds. Whole grains, such as whole grain bread and fortified cereals. Drink fluids that contain salts and minerals (electrolytes), such as sports drinks, when you are active. Do not drink alcohol. General instructions Take over-the-counter and prescription medicines only as told by your health care provider. Take magnesium  supplements as directed if your health care provider tells you to take them. Have your magnesium  levels monitored as told by your health care provider. Keep all follow-up visits. This is important. Contact a health care provider if: You get worse instead of better. Your symptoms return. Get help right away if: You develop severe muscle weakness. You have trouble breathing. You feel that your heart is racing. These symptoms may represent a serious problem that is an emergency. Do not wait to see if the symptoms will go away. Get medical help right away. Call your local emergency services (911 in the [place] Do not drive yourself to the hospital. Summary Hypomagnesemia is a condition in which the level of magnesium  in the blood is too low. Hypomagnesemia can affect every organ in the body. Treatment may include eating more foods that contain magnesium , taking magnesium  supplements, and not drinking alcohol. Have your magnesium  levels monitored as told by your health care provider. This information is not intended to replace advice given to you by your health care provider. Make sure you discuss any questions you have with your health care provider. Document Revised: 03/09/2021 Document Reviewed: 03/09/2021 Elsevier Patient Education  2024 [employer]vier Inc[employer]

## 2024-08-22 NOTE — Progress Notes (Signed)
 Hematology and Oncology Follow Up Visit  Wendy Barber [id number] 06-29-1961 63 y.o. 08/22/2024   Principle Diagnosis:  History of metastatic appendiceal cancer  -- recurrent  Superior mesenteric vein thrombus Thromboembolism of the RIGHT leg Pernicious anemia Acute pulmonary embolism-segmental right pulmonary artery Carcinoid/neuroendocrine carcinoma  Current Therapy:   HIPEC - Surgery done in [place] in 11/2020 Arixtra  10 mg subcu daily --start on 2/16 2024 Vitamin B12 5000 mcg PO daily  Somatuline 120 mg IM monthly-start on 07/12/2023 -DC on 08/22/2024     Interim History:  Wendy Barber is in for follow-up.  The big news is that she will have surgery for an ostomy.  This will hopefully help contain her bad, chronic diarrhea.  I know that she has had a very poor quality of life because she has had bad diarrhea.  Dr. Claryce, at [facility], we will do this on November 5.  She is on Arixtra .  I told her to stop the Arixtra  on Monday before her procedure.  I think this should be long enough to prevent any kind of blood clots and prevent any bleeding.  She has had no fever.  She has had no cough.  She has had little bit of nausea..  She does have some chronic leg pain.  There is more problems in her left leg than right leg..    I do think that she had radiosurgery for the meningioma.  She did well with this.  I think her last treatment was probably on 08/01/2024.  Of note, her last Chromogranin A level was 39.  We are holding any further Somatuline therapy on her for right now.  Overall, I would have to say that her performance status is probably ECOG 1.   Wt Readings from Last 3 Encounters:  07/29/24 237 lb (107.5 kg)  07/25/24 (P) 238 lb 8 oz (108.2 kg)  07/04/24 239 lb 12.8 oz (108.8 kg)    Medications:  Current Outpatient Medications:    acetaminophen  (TYLENOL ) 500 MG tablet, Take 1,000 mg by mouth every 6 (six) hours as needed for moderate pain (pain score 4-6)., Disp: ,  Rfl:    amLODipine  (NORVASC ) 5 MG tablet, TAKE 1 TABLET(5 MG) BY MOUTH DAILY, Disp: 90 tablet, Rfl: 1   busPIRone  (BUSPAR ) 15 MG tablet, Take 1 tablet (15 mg total) by mouth 3 (three) times daily., Disp: 270 tablet, Rfl: 0   ciprofloxacin  (CIPRO ) 500 MG tablet, Take 1 tablet (500 mg total) by mouth 2 (two) times daily., Disp: 14 tablet, Rfl: 1   citalopram  (CELEXA ) 40 MG tablet, Take 1 tablet (40 mg total) by mouth daily., Disp: 90 tablet, Rfl: 1   Continuous Glucose Sensor (DEXCOM G7 SENSOR) MISC, 1 Device by Does not apply route as directed., Disp: 9 each, Rfl: 3   cyclobenzaprine  (FLEXERIL ) 10 MG tablet, TAKE 1/2 TO 1 TABLET(5 TO 10 MG) BY MOUTH THREE TIMES DAILY AS NEEDED FOR MUSCLE SPASMS. START EVERY NIGHT AT BEDTIME AS NEEDED DUE TO SEDATION, Disp: 30 tablet, Rfl: 0   diazepam  (VALIUM ) 10 MG tablet, Take one pill as needed for procedures including MRI, Disp: 3 tablet, Rfl: 0   dicyclomine  (BENTYL ) 20 MG tablet, TAKE 1 TABLET(20 MG) BY MOUTH THREE TIMES DAILY BEFORE MEALS, Disp: 90 tablet, Rfl: 4   diphenoxylate -atropine  (LOMOTIL ) 2.5-0.025 MG tablet, TAKE 2 TABLETS BY MOUTH FOUR TIMES DAILY AS NEEDED FOR LOOSE STOOLS OR DIARRHEA, Disp: 240 tablet, Rfl: 2   eszopiclone  (LUNESTA ) 2 MG TABS tablet, TAKE 1 TABLET(2 MG)  BY MOUTH AT BEDTIME AS NEEDED FOR SLEEP, Disp: 30 tablet, Rfl: 3   famotidine  (PEPCID ) 20 MG tablet, TAKE 1 TABLET(20 MG) BY MOUTH TWICE DAILY, Disp: 180 tablet, Rfl: 1   fondaparinux  (ARIXTRA ) 10 MG/0.8ML SOLN injection, Inject 0.8 mLs (10 mg total) into the skin daily., Disp: 72 mL, Rfl: 3   hydrocortisone  (ANUSOL -HC) 2.5 % rectal cream, PLACE RECTALLY 4 TIMES DAILY AS NEEDED FOR HEMORRHOIDS OR ANAL ITCHING, Disp: 30 g, Rfl: 1   HYDROmorphone  (DILAUDID ) 4 MG tablet, Take 1 tablet (4 mg total) by mouth every 6 (six) hours as needed for severe pain (pain score 7-10)., Disp: 60 tablet, Rfl: 0   insulin  aspart (NOVOLOG  FLEXPEN) 100 UNIT/ML FlexPen, Max daily 60 units, Disp: 60 mL, Rfl:  3   insulin  glargine (LANTUS  SOLOSTAR) 100 UNIT/ML Solostar Pen, Inject 40 Units into the skin daily., Disp: 45 mL, Rfl: 4   Insulin  Pen Needle (EMBECTA PEN NEEDLE NANO 2 GEN) 32G X 4 MM MISC, Use to check blood glucose  times a day., Disp: 300 each, Rfl: 1   Insulin  Pen Needle 32G X 4 MM MISC, 1 Device by Does not apply route in the morning, at noon, in the evening, and at bedtime., Disp: 400 each, Rfl: 3   lidocaine -hydrocortisone  (ANAMANTLE) 3-1 % KIT, Place 1 Application rectally 2 (two) times daily., Disp: 1 kit, Rfl: 0   loperamide  (IMODIUM ) 2 MG capsule, Take 2 capsules (4 mg total) by mouth 4 (four) times daily as needed for diarrhea or loose stools., Disp: , Rfl:    meclizine  (ANTIVERT ) 25 MG tablet, TAKE 1 TABLET(25 MG) BY MOUTH THREE TIMES DAILY AS NEEDED FOR DIZZINESS, Disp: 60 tablet, Rfl: 0   mirtazapine  (REMERON ) 15 MG tablet, Take 1 tablet (15 mg total) by mouth at bedtime., Disp: 90 tablet, Rfl: 1   ondansetron  (ZOFRAN ) 8 MG tablet, TAKE 1 TABLET(8 MG) BY MOUTH EVERY 8 HOURS AS NEEDED FOR NAUSEA OR VOMITING, Disp: 30 tablet, Rfl: 2   orphenadrine  (NORFLEX ) 100 MG tablet, TAKE 1 TABLET(100 MG) BY MOUTH AT BEDTIME AS NEEDED FOR MUSCLE SPASMS, Disp: 30 tablet, Rfl: 2   pantoprazole  (PROTONIX ) 40 MG tablet, TAKE 1 TABLET BY MOUTH EVERY DAY BEFORE BREAKFAST, Disp: 90 tablet, Rfl: 1   pramoxine-hydrocortisone  (PROCTOCREAM-HC) 1-1 % rectal cream, APPLY RECTALLY TO THE AFFECTED AREA TWICE DAILY, Disp: 30 g, Rfl: 3   promethazine  (PHENERGAN ) 12.5 MG tablet, TAKE 1 TABLET(12.5 MG) BY MOUTH EVERY 6 HOURS AS NEEDED FOR NAUSEA OR VOMITING, Disp: 90 tablet, Rfl: 3   propranolol  (INDERAL ) 20 MG tablet, Take 1 tablet (20 mg total) by mouth 2 (two) times daily., Disp: 180 tablet, Rfl: 1   terconazole  (TERAZOL 7 ) 0.4 % vaginal cream, Apply topically twice daily for up to 7 days, Disp: 45 g, Rfl: 2  Allergies:  Allergies  Allergen Reactions   Ativan [Lorazepam] Swelling and Other (See Comments)     Face & Throat Swelling  Note: tolerates midazolam  fine   Corticosteroids Other (See Comments)    Psychotic behavior    Penicillins Shortness Of Breath and Other (See Comments)    Irregular and rapid Heart Rate, too    Alprazolam Hives and Other (See Comments)    Hard to arouse, unresponsiveness also   Erythromycin Nausea And Vomiting        Gabapentin  Other (See Comments)    Made the patient feel depressed   Prednisolone Anxiety   Prednisone Anxiety and Other (See Comments)    Anxiety &  Nervous Breakdown   Savella  [Milnacipran] Other (See Comments)    Reaction not noted    Past Medical History, Surgical history, Social history, and Family History were reviewed and updated.  Review of Systems: Review of Systems  Constitutional:  Positive for fatigue and unexpected weight change.  HENT:  Negative.    Eyes: Negative.   Respiratory: Negative.    Cardiovascular: Negative.   Gastrointestinal:  Positive for abdominal pain and diarrhea.  Endocrine: Negative.   Genitourinary:  Positive for dysuria.   Musculoskeletal:  Positive for back pain.  Skin: Negative.   Neurological: Negative.   Hematological: Negative.   Psychiatric/Behavioral:  Negative for depression.     Physical Exam:   Her vital signs show temperature of 98.9.  Pulse 87.  Blood pressure 137/76.  Weight is 238 pounds.  Wt Readings from Last 3 Encounters:  07/29/24 237 lb (107.5 kg)  07/25/24 (P) 238 lb 8 oz (108.2 kg)  07/04/24 239 lb 12.8 oz (108.8 kg)    Physical Exam Vitals reviewed.  HENT:     Head: Normocephalic and atraumatic.  Eyes:     Pupils: Pupils are equal, round, and reactive to light.  Cardiovascular:     Rate and Rhythm: Normal rate and regular rhythm.     Heart sounds: Normal heart sounds.  Pulmonary:     Effort: Pulmonary effort is normal.     Breath sounds: Normal breath sounds.  Abdominal:     General: Bowel sounds are normal.     Palpations: Abdomen is soft.   Musculoskeletal:        General: No tenderness or deformity. Normal range of motion.     Cervical back: Normal range of motion.  Lymphadenopathy:     Cervical: No cervical adenopathy.  Skin:    General: Skin is warm and dry.     Findings: No erythema or rash.  Neurological:     Mental Status: She is alert and oriented to person, place, and time.  Psychiatric:        Behavior: Behavior normal.        Thought Content: Thought content normal.        Judgment: Judgment normal.    Lab Results  Component Value Date   WBC 8.8 07/04/2024   HGB 12.5 07/04/2024   HCT 38.9 07/04/2024   MCV 95.8 07/04/2024   PLT 220 07/04/2024     Chemistry      Component Value Date/Time   NA 140 07/04/2024 1450   NA 137 04/24/2023 1437   K 3.9 07/04/2024 1450   CL 102 07/04/2024 1450   CO2 26 07/04/2024 1450   BUN 14 07/04/2024 1450   BUN 18 04/24/2023 1437   CREATININE 0.89 07/04/2024 1450   CREATININE 0.96 01/19/2024 1415      Component Value Date/Time   CALCIUM  8.9 07/04/2024 1450   ALKPHOS 249 (H) 07/04/2024 1450   AST 68 (H) 07/04/2024 1450   ALT 76 (H) 07/04/2024 1450   BILITOT 0.4 07/04/2024 1450      Impression and Plan: Ms. Kosiba is a very charming 63 year-old white female. She has recurrent appendiceal cancer.  She underwent a HIPEC procedure in [place] a couple years ago. Unfortunately she had had chronic diarrhea since including C. Diff which has been successfully treatment.   I really hope that everything will go well with surgery.  I know that she has a lot of scar tissue.  Hopefully, Dr. Claryce will be able to place  the ostomy to make life a lot easier for her.  Again, I told her to stop the Arixtra  on the Monday before her surgery.  Her magnesium  was a bit low today.  The magnesium  was 1.7.  I think that we probably need to give her some IV magnesium  just to make sure that we repleted her stores just to make sure that she does not have a bad drop after she has  surgery.  I worry that she cannot have a high output ostomy that she may have problems with her electrolytes.  I am sure that [facility] will watch this closely.  For right now, I will plan to get her back to see me after Thanksgiving.  Hopefully, this will give her enough time to recuperate from surgery.    Maude JONELLE Crease, MD 10/30/20251:16 PM

## 2024-08-22 NOTE — Progress Notes (Addendum)
 DIAGNOSIS: Benign neoplasm of cerebral meninges   Wendy Barber presents today via telephone for a one month follow up post radiation to the brain. She completed their radiation on 08/01/24.  Does the patient complain of any of the following: Headache: Denies Visual Changes: Denies Hearing Changes: Denies Nausea: Yes Vomiting: Yes Balance or coordination issues: Yes, it is not new but has heighten it Memory issues: Denies  Is the patient currently on a Decadron  regimen? : No No vitals were taken at this visit.  Additional comments if applicable:This encounter was conducted via telephone. The patient has provided two factor identification and has given verbal consent for this type of encounter and has been advised to only accept a meeting of this type in a secure network environment.

## 2024-08-22 NOTE — Patient Instructions (Signed)

## 2024-08-23 ENCOUNTER — Encounter: Payer: Self-pay | Admitting: Hematology & Oncology

## 2024-08-23 ENCOUNTER — Encounter: Payer: Self-pay | Admitting: Family Medicine

## 2024-08-23 ENCOUNTER — Other Ambulatory Visit: Payer: Self-pay | Admitting: *Deleted

## 2024-08-23 DIAGNOSIS — C181 Malignant neoplasm of appendix: Secondary | ICD-10-CM

## 2024-08-23 MED ORDER — HYDROMORPHONE HCL 4 MG PO TABS: 4.0000 mg | ORAL_TABLET | Freq: Four times a day (QID) | ORAL | 0 refills | Status: DC | PRN

## 2024-08-24 LAB — CHROMOGRANIN A: Chromogranin A (ng/mL): 49.6 ng/mL (ref 0.0–101.8)

## 2024-08-26 ENCOUNTER — Ambulatory Visit
Admission: RE | Admit: 2024-08-26 | Discharge: 2024-08-26 | Disposition: A | Discharge status: Home / Self Care | Source: Ambulatory Visit | Attending: Radiation Oncology | Admitting: Radiation Oncology

## 2024-08-26 ENCOUNTER — Ambulatory Visit: Admitting: Radiation Oncology

## 2024-08-26 ENCOUNTER — Telehealth: Payer: Self-pay | Admitting: Radiation Oncology

## 2024-08-26 DIAGNOSIS — D320 Benign neoplasm of cerebral meninges: Secondary | ICD-10-CM

## 2024-08-26 HISTORY — DX: Personal history of irradiation: Z92.3

## 2024-08-26 NOTE — Progress Notes (Signed)
 Radiation Oncology         [phone] ________________________________  Name: Wendy Barber MRN: [id number]  Date: 08/26/2024  DOB: 1960/11/04  Follow-Up Visit Note - Conducted via telephone for patient preference.  I spoke with the patient to conduct this consult visit via telephone. The patient was notified in advance and was offered an in person or telemedicine meeting and opted to proceed with a telephone consult.   CC: [name], MD  [name], MD  DIAGNOSIS: Benign neoplasm of cerebral meninges, D32.0   Interval Since Last Radiation:  approximately 4 weeks  Narrative:  The patient returns today for routine follow-up.  Since we last saw her she's had fu w/ Dr. Timmy. She is scheduled for her colostomy with Dr. Claryce on 08/29/24. Today she has been having significant/bothersome diarrhea which is why she wanted to have a telephone visit. Denies headaches/nausea/vomiting from the radiation. Did have some fatigue afterwards. Otherwise, tolerated it without symptoms.  ALLERGIES:  is allergic to ativan [lorazepam], corticosteroids, penicillins, alprazolam, erythromycin, gabapentin , prednisolone, prednisone, and savella  [milnacipran].  Meds: Current Outpatient Medications  Medication Sig Dispense Refill   acetaminophen  (TYLENOL ) 500 MG tablet Take 1,000 mg by mouth every 6 (six) hours as needed for moderate pain (pain score 4-6).     amLODipine  (NORVASC ) 5 MG tablet TAKE 1 TABLET(5 MG) BY MOUTH DAILY 90 tablet 1   busPIRone  (BUSPAR ) 15 MG tablet Take 1 tablet (15 mg total) by mouth 3 (three) times daily. 270 tablet 0   citalopram  (CELEXA ) 40 MG tablet Take 1 tablet (40 mg total) by mouth daily. 90 tablet 1   Continuous Glucose Sensor (DEXCOM G7 SENSOR) MISC 1 Device by Does not apply route as directed. 9 each 3   cyclobenzaprine  (FLEXERIL ) 10 MG tablet TAKE 1/2 TO 1 TABLET(5 TO 10 MG) BY MOUTH THREE TIMES DAILY AS NEEDED FOR MUSCLE SPASMS. START EVERY NIGHT AT  BEDTIME AS NEEDED DUE TO SEDATION 30 tablet 0   diazepam  (VALIUM ) 10 MG tablet Take one pill as needed for procedures including MRI 3 tablet 0   dicyclomine  (BENTYL ) 20 MG tablet TAKE 1 TABLET(20 MG) BY MOUTH THREE TIMES DAILY BEFORE MEALS 90 tablet 4   diphenoxylate -atropine  (LOMOTIL ) 2.5-0.025 MG tablet TAKE 2 TABLETS BY MOUTH FOUR TIMES DAILY AS NEEDED FOR LOOSE STOOLS OR DIARRHEA 240 tablet 2   estradiol (ESTRACE) 0.01 % CREA vaginal cream Place 1 Applicatorful vaginally every 14 (fourteen) days.     eszopiclone  (LUNESTA ) 2 MG TABS tablet TAKE 1 TABLET(2 MG) BY MOUTH AT BEDTIME AS NEEDED FOR SLEEP 30 tablet 3   famotidine  (PEPCID ) 20 MG tablet TAKE 1 TABLET(20 MG) BY MOUTH TWICE DAILY 180 tablet 1   fondaparinux  (ARIXTRA ) 10 MG/0.8ML SOLN injection Inject 0.8 mLs (10 mg total) into the skin daily. 72 mL 3   hydrocortisone  (ANUSOL -HC) 2.5 % rectal cream PLACE RECTALLY 4 TIMES DAILY AS NEEDED FOR HEMORRHOIDS OR ANAL ITCHING 30 g 1   HYDROmorphone  (DILAUDID ) 4 MG tablet Take 1 tablet (4 mg total) by mouth every 6 (six) hours as needed for severe pain (pain score 7-10). 60 tablet 0   insulin  aspart (NOVOLOG  FLEXPEN) 100 UNIT/ML FlexPen Max daily 60 units 60 mL 3   insulin  glargine (LANTUS  SOLOSTAR) 100 UNIT/ML Solostar Pen Inject 40 Units into the skin daily. 45 mL 4   Insulin  Pen Needle (EMBECTA PEN NEEDLE NANO 2 GEN) 32G X 4 MM MISC Use to check blood glucose  times a day.  300 each 1   Insulin  Pen Needle 32G X 4 MM MISC 1 Device by Does not apply route in the morning, at noon, in the evening, and at bedtime. 400 each 3   lidocaine -hydrocortisone  (ANAMANTLE) 3-1 % KIT Place 1 Application rectally 2 (two) times daily. 1 kit 0   loperamide  (IMODIUM ) 2 MG capsule Take 2 capsules (4 mg total) by mouth 4 (four) times daily as needed for diarrhea or loose stools.     meclizine  (ANTIVERT ) 25 MG tablet TAKE 1 TABLET(25 MG) BY MOUTH THREE TIMES DAILY AS NEEDED FOR DIZZINESS 60 tablet 0   mirtazapine   (REMERON ) 15 MG tablet Take 1 tablet (15 mg total) by mouth at bedtime. 90 tablet 1   ondansetron  (ZOFRAN ) 8 MG tablet TAKE 1 TABLET(8 MG) BY MOUTH EVERY 8 HOURS AS NEEDED FOR NAUSEA OR VOMITING 30 tablet 2   orphenadrine  (NORFLEX ) 100 MG tablet TAKE 1 TABLET(100 MG) BY MOUTH AT BEDTIME AS NEEDED FOR MUSCLE SPASMS 30 tablet 2   pantoprazole  (PROTONIX ) 40 MG tablet TAKE 1 TABLET BY MOUTH EVERY DAY BEFORE BREAKFAST 90 tablet 1   pramoxine-hydrocortisone  (PROCTOCREAM-HC) 1-1 % rectal cream APPLY RECTALLY TO THE AFFECTED AREA TWICE DAILY 30 g 3   promethazine  (PHENERGAN ) 12.5 MG tablet TAKE 1 TABLET(12.5 MG) BY MOUTH EVERY 6 HOURS AS NEEDED FOR NAUSEA OR VOMITING 90 tablet 3   propranolol  (INDERAL ) 20 MG tablet Take 1 tablet (20 mg total) by mouth 2 (two) times daily. 180 tablet 1   terconazole  (TERAZOL 7 ) 0.4 % vaginal cream Apply topically twice daily for up to 7 days 45 g 2   No current facility-administered medications for this encounter.    Physical Findings: deferred, telephone visit  Lab Findings: Lab Results  Component Value Date   WBC 9.7 08/22/2024   HGB 13.1 08/22/2024   HCT 40.0 08/22/2024   MCV 96.2 08/22/2024   PLT 168 08/22/2024    Radiographic Findings: No results found.  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 The patient is recovering well from radiation.  Plan:  Fu with MRI Brain [facility] in February 2026  ---  This encounter was conducted via telephone. The patient has provided two factor identification and has given verbal consent for this type of encounter and has been advised to only accept a meeting of this type in a secure network environment.  The attendants for this meeting include  Eudora Claudene Estefana Maritza  and @NAME @. During the encounter,  Eudora Claudene and I were located at [facility] Radiation Oncology Department. The patient was located at home.   Total time spent today in preparation for this visit was 30 minutes. This included patient care, imaging and path review, documentation, and coordination of care and follow up.    Estefana HERO. Maritza, M.D.

## 2024-08-26 NOTE — Telephone Encounter (Signed)
 Received call from pt's husband requesting cx of f/u appt due to pt not feeling well and r/s needing to be delayed due to upcoming sx through [facility]. I called nursing team who cleared with Dr. Maritza, ok to offer telephone f/u. Appt type offered to pt's husband who advised this would be very helpful and they could do this. Appt type changed to telephone and pt will receive call to complete f/u appt.

## 2024-08-29 ENCOUNTER — Encounter: Payer: Self-pay | Admitting: Hematology & Oncology

## 2024-09-02 ENCOUNTER — Ambulatory Visit: Admitting: Radiation Oncology

## 2024-09-02 ENCOUNTER — Other Ambulatory Visit: Payer: Self-pay | Admitting: Hematology & Oncology

## 2024-09-02 DIAGNOSIS — H8112 Benign paroxysmal vertigo, left ear: Secondary | ICD-10-CM

## 2024-09-05 ENCOUNTER — Telehealth: Payer: Self-pay | Admitting: *Deleted

## 2024-09-05 NOTE — Transitions of Care (Post Inpatient/ED Visit) (Signed)
 09/05/2024  Name: Wendy Barber MRN: [id number] DOB: 02-07-1961  Today's TOC FU Call Status: Today's TOC FU Call Status:: Successful TOC FU Call Completed TOC FU Call Complete Date: 09/05/24  Patient's Name and Date of Birth confirmed. Name, DOB  Transition Care Management Follow-up Telephone Call Date of Discharge: 09/03/24 Discharge Facility: Other [facility]) Name of Other (Non[facility]) Discharge Facility: [facility] Type of Discharge: Inpatient Admission Primary Inpatient Discharge Diagnosis:: Planned exploratory lap with lysis of adhesions-- aborted creation of colostomy secondary to malignant neoplasm of appendix with mets How have you been since you were released from the hospital?: Better (I am okay- sore from the surgery but I have pain medicine and it is helping; I have plenty of support through the cancer center and my family.  I will schedule with my psychiatrist soon.  I don't need any additional follow up) Any questions or concerns?: No  Items Reviewed: Did you receive and understand the discharge instructions provided?: Yes (briefly reviewed with patient who verbalizes good understanding of same - outside hospital AVS) Medications obtained,verified, and reconciled?: No (Declined medication reconciliation/ review; confirmed patient obtained/ is taking all newly Rx'd medications as instructed; self-manages medications and denies questions/ concerns around medications today) Medications Not Reviewed Reasons:: Other: (Patient declined medication review during TOC call; states they just put me on some muscle relaxers is all that is different) Any new allergies since your discharge?: No Dietary orders reviewed?: Yes Type of Diet Ordered:: Soft diet as per post-op instructions Do you have support at home?: Yes People in Home [RPT]: spouse Name of Support/Comfort Primary Source: Reports independent in self-care activities; resides supportive spouse assists as/ if needed/  indicated  Medications Reviewed Today: Medications Reviewed Today     Reviewed by Nickholas Goldston M, RN (Registered Nurse) on 09/05/24 at 0940  Med List Status: <None>   Medication Order Taking? Sig Documenting Provider Last Dose Status Informant  acetaminophen  (TYLENOL ) 500 MG tablet [id number]  Take 1,000 mg by mouth every 6 (six) hours as needed for moderate pain (pain score 4-6). [provider]  Active Self, Pharmacy Records  amLODipine  (NORVASC ) 5 MG tablet [id number]  TAKE 1 TABLET(5 MG) BY MOUTH DAILY [name], MD  Active   busPIRone  (BUSPAR ) 15 MG tablet [id number]  Take 1 tablet (15 mg total) by mouth 3 (three) times daily. Teresa Redell LABOR, NP  Active   citalopram  (CELEXA ) 40 MG tablet [id number]  Take 1 tablet (40 mg total) by mouth daily. Teresa Redell LABOR, NP  Active   Continuous Glucose Sensor (DEXCOM G7 SENSOR) MISC [id number]  1 Device by Does not apply route as directed. [name], Ibtehal Jaralla, MD  Active   cyclobenzaprine  (FLEXERIL ) 10 MG tablet [id number]  TAKE 1/2 TO 1 TABLET(5 TO 10 MG) BY MOUTH THREE TIMES DAILY AS NEEDED FOR MUSCLE SPASMS. START EVERY NIGHT AT BEDTIME AS NEEDED DUE TO SEDATION [name], MD  Active   diazepam  (VALIUM ) 10 MG tablet [id number]  Take one pill as needed for procedures including MRI Maritza Stagger, MD  Active   dicyclomine  (BENTYL ) 20 MG tablet [id number]  TAKE 1 TABLET(20 MG) BY MOUTH THREE TIMES DAILY BEFORE MEALS Timmy Maude SAUNDERS, MD  Active Self, Pharmacy Records  diphenoxylate -atropine  (LOMOTIL ) 2.5-0.025 MG tablet [id number]  TAKE 2 TABLETS BY MOUTH FOUR TIMES DAILY AS NEEDED FOR LOOSE STOOLS OR DIARRHEA Timmy Maude SAUNDERS, MD  Active   estradiol (ESTRACE) 0.01 % CREA vaginal cream [id number]  Place 1 Applicatorful vaginally every 14 (  fourteen) days. [provider]  Active   eszopiclone  (LUNESTA ) 2 MG TABS tablet [id number]  TAKE 1 TABLET(2 MG) BY MOUTH AT BEDTIME AS NEEDED FOR SLEEP White, Redell LABOR, NP  Active    famotidine  (PEPCID ) 20 MG tablet [id number]  TAKE 1 TABLET(20 MG) BY MOUTH TWICE DAILY [name], MD  Active   fondaparinux  (ARIXTRA ) 10 MG/0.8ML SOLN injection [id number]  Inject 0.8 mLs (10 mg total) into the skin daily. Timmy Maude SAUNDERS, MD  Active   hydrocortisone  (ANUSOL -HC) 2.5 % rectal cream [id number]  PLACE RECTALLY 4 TIMES DAILY AS NEEDED FOR HEMORRHOIDS OR ANAL ITCHING Ennever, Maude SAUNDERS, MD  Active   HYDROmorphone  (DILAUDID ) 4 MG tablet [id number]  Take 1 tablet (4 mg total) by mouth every 6 (six) hours as needed for severe pain (pain score 7-10). Timmy Maude SAUNDERS, MD  Active   insulin  aspart (NOVOLOG  FLEXPEN) 100 UNIT/ML FlexPen [id number]  Max daily 60 units [name], Ibtehal Jaralla, MD  Active   insulin  glargine (LANTUS  SOLOSTAR) 100 UNIT/ML Solostar Pen [id number]  Inject 40 Units into the skin daily. [name], Donell Cardinal, MD  Active   Insulin  Pen Needle (EMBECTA PEN NEEDLE NANO 2 GEN) 32G X 4 MM MISC [id number]  Use to check blood glucose  times a day. [name], Donell Cardinal, MD  Active   Insulin  Pen Needle 32G X 4 MM MISC [id number]  1 Device by Does not apply route in the morning, at noon, in the evening, and at bedtime. [name], Ibtehal Jaralla, MD  Active   lidocaine -hydrocortisone  (ANAMANTLE) 3-1 % KIT [id number]  Place 1 Application rectally 2 (two) times daily. Elnor Jayson LABOR, DO  Active Self, Pharmacy Records  loperamide  (IMODIUM ) 2 MG capsule [id number]  Take 2 capsules (4 mg total) by mouth 4 (four) times daily as needed for diarrhea or loose stools. Cheryle Page, MD  Active Self, Pharmacy Records  meclizine  (ANTIVERT ) 25 MG tablet [id number]  TAKE 1 TABLET(25 MG) BY MOUTH THREE TIMES DAILY AS NEEDED FOR DIZZINESS Ennever, Peter R, MD  Active   mirtazapine  (REMERON ) 15 MG tablet [id number]  Take 1 tablet (15 mg total) by mouth at bedtime. Teresa Redell LABOR, NP  Active   ondansetron  (ZOFRAN ) 8 MG tablet [id number]  TAKE 1 TABLET(8 MG) BY MOUTH EVERY 8 HOURS AS NEEDED  FOR NAUSEA OR VOMITING Timmy Maude SAUNDERS, MD  Active   orphenadrine  (NORFLEX ) 100 MG tablet [id number]  TAKE 1 TABLET(100 MG) BY MOUTH AT BEDTIME AS NEEDED FOR MUSCLE SPASMS Ennever, Peter R, MD  Active   pantoprazole  (PROTONIX ) 40 MG tablet [id number]  TAKE 1 TABLET BY MOUTH EVERY DAY BEFORE BREAKFAST [name], MD  Active   pramoxine-hydrocortisone  (PROCTOCREAM-HC) 1-1 % rectal cream [id number]  APPLY RECTALLY TO THE AFFECTED AREA TWICE DAILY Ennever, Peter R, MD  Active Self, Pharmacy Records  promethazine  (PHENERGAN ) 12.5 MG tablet [id number]  TAKE 1 TABLET(12.5 MG) BY MOUTH EVERY 6 HOURS AS NEEDED FOR NAUSEA OR VOMITING Ennever, Maude SAUNDERS, MD  Active   propranolol  (INDERAL ) 20 MG tablet [id number]  Take 1 tablet (20 mg total) by mouth 2 (two) times daily. Teresa Redell LABOR, NP  Active   terconazole  (TERAZOL 7 ) 0.4 % vaginal cream [id number]  Apply topically twice daily for up to 7 days Cleotilde Ronal RAMAN, MD  Active Self, Pharmacy Records           Home Care and Equipment/Supplies: Were Home Health Services Ordered?: No Any new equipment or medical supplies ordered?: No  Functional Questionnaire: Do you need assistance with bathing/showering or dressing?: No Do you need assistance with meal preparation?: No Do you need assistance with eating?: No Do you have difficulty maintaining continence: No Do you need assistance with getting out of bed/getting out of a chair/moving?: No Do you have difficulty managing or taking your medications?: Yes (husband assists as-if indicated)  Follow up appointments reviewed: PCP Follow-up appointment confirmed?: Yes (care coordination outreach in real-time with scheduling care guide to successfully schedule hospital follow up PCP appointment 09/11/24) Date of PCP follow-up appointment?: 09/11/24 Follow-up Provider: PCP- Dr. Levora Specialist [facility] Follow-up appointment confirmed?: Yes Date of Specialist follow-up appointment?: 09/18/24 Follow-Up  Specialty Provider:: Surgical provider at [facility] 09/18/24; Oncology provider 10/04/24 Do you need transportation to your follow-up appointment?: No Do you understand care options if your condition(s) worsen?: Yes-patient verbalized understanding  SDOH Interventions Today    Flowsheet Row Most Recent Value  SDOH Interventions   Food Insecurity Interventions Intervention Not Indicated  Housing Interventions Intervention Not Indicated  Transportation Interventions Intervention Not Indicated  [husband provides transportation]  Utilities Interventions Intervention Not Indicated  Depression Interventions/Treatment  Currently on Treatment, Counseling  [Reports long-standing chronic depression ever since my cancer started,  confirms established with psychiatrist for depression management- confirms taking antidepressant medication as prescribed]   See TOC assessment tabs for additional assessment/ TOC intervention information Discussed ongoing depression around cancer diagnosis: confirmed patient is currently established with psychiatry/ counseling provider and has contact information for both: encouraged her to contact mental health team should she feel her symptoms are worsening after recent surgery with aborted surgical creation of colostomy- confirmed she has contact information for mental health providers Confirmed no SI or intent to harm self Emotional support provided  Patient declines need for ongoing/ further care management/ coordination outreach; declines enrollment in 30-day TOC program- declines taking my direct phone number should needs/ concerns arise post-TOC call   Pls call/ message for questions,  Chelcy Bolda Mckinney Chyrel Taha, RN, BSN, CCRN Alumnus RN Care Manager  Transitions of Care  [facility] - Population Health  [place] [phone]: direct office

## 2024-09-11 ENCOUNTER — Inpatient Hospital Stay: Admitting: Family Medicine

## 2024-09-13 DIAGNOSIS — R739 Hyperglycemia, unspecified: Secondary | ICD-10-CM | POA: Insufficient documentation

## 2024-09-18 ENCOUNTER — Telehealth: Payer: Self-pay | Admitting: *Deleted

## 2024-09-18 NOTE — Transitions of Care (Post Inpatient/ED Visit) (Signed)
 09/18/2024  Name: Wendy Barber MRN: [id number] DOB: Mar 18, 1961  Today's TOC FU Call Status: Today's TOC FU Call Status:: Successful TOC FU Call Completed TOC FU Call Complete Date: 09/18/24  Patient's Name and Date of Birth confirmed. Name, DOB  Transition Care Management Follow-up Telephone Call Date of Discharge: 09/17/24 Discharge Facility: Other ([facility]) Name of Other ([facility]) Discharge Facility: [facility] Type of Discharge: Inpatient Admission Primary Inpatient Discharge Diagnosis:: abdominal wall abscess with cellulitis- fistula; sepsis How have you been since you were released from the hospital?: Same (I don't know- I guess I am okay, it's just one complication after another.  My family is taking good care of me and my daughter is a engineer, civil (consulting), and I have so many appointments, so much going on, I just canh't handle more appointments or calls right now) Any questions or concerns?: No  Items Reviewed: Did you receive and understand the discharge instructions provided?: Yes (briefly reviewed with patient who verbalizes good understanding of same - outside hospital AVS) Medications obtained,verified, and reconciled?: No (Declined medication reconciliation/ review; confirmed per patient obtained/ is taking all newly Rx'd medications as instructed; self-manages medications and denies questions/ concerns around medications today) Medications Not Reviewed Reasons:: Other: (Declined medication review) Any new allergies since your discharge?: No Dietary orders reviewed?: Yes Type of Diet Ordered:: Low fiber/ residue diet Do you have support at home?: Yes People in Home [RPT]: spouse Name of Support/Comfort Primary Source: Reports independent in self-care activities; resides with supportive spouse- spouse and local nurse adult daughter: assists as/ if needed/ indicated  Medications Reviewed Today: Medications Reviewed Today     Reviewed by Katya Rolston M, RN (Registered  Nurse) on 09/18/24 at 1739  Med List Status: <None>   Medication Order Taking? Sig Documenting Provider Last Dose Status Informant  acetaminophen  (TYLENOL ) 500 MG tablet [id number]  Take 1,000 mg by mouth every 6 (six) hours as needed for moderate pain (pain score 4-6). [provider]  Active Self, Pharmacy Records  amLODipine  (NORVASC ) 5 MG tablet [id number]  TAKE 1 TABLET(5 MG) BY MOUTH DAILY [name], MD  Active   busPIRone  (BUSPAR ) 15 MG tablet [id number]  Take 1 tablet (15 mg total) by mouth 3 (three) times daily. Teresa Redell LABOR, NP  Active   citalopram  (CELEXA ) 40 MG tablet [id number]  Take 1 tablet (40 mg total) by mouth daily. Teresa Redell LABOR, NP  Active   Continuous Glucose Sensor (DEXCOM G7 SENSOR) MISC [id number]  1 Device by Does not apply route as directed. [name], Ibtehal Jaralla, MD  Active   cyclobenzaprine  (FLEXERIL ) 10 MG tablet [id number]  TAKE 1/2 TO 1 TABLET(5 TO 10 MG) BY MOUTH THREE TIMES DAILY AS NEEDED FOR MUSCLE SPASMS. START EVERY NIGHT AT BEDTIME AS NEEDED DUE TO SEDATION [name], MD  Active   diazepam  (VALIUM ) 10 MG tablet [id number]  Take one pill as needed for procedures including MRI Maritza Stagger, MD  Active   dicyclomine  (BENTYL ) 20 MG tablet [id number]  TAKE 1 TABLET(20 MG) BY MOUTH THREE TIMES DAILY BEFORE MEALS Ennever, Maude SAUNDERS, MD  Active Self, Pharmacy Records  diphenoxylate -atropine  (LOMOTIL ) 2.5-0.025 MG tablet [id number]  TAKE 2 TABLETS BY MOUTH FOUR TIMES DAILY AS NEEDED FOR LOOSE STOOLS OR DIARRHEA Timmy Maude SAUNDERS, MD  Active   estradiol (ESTRACE) 0.01 % CREA vaginal cream [id number]  Place 1 Applicatorful vaginally every 14 (fourteen) days. [provider]  Active   eszopiclone  (LUNESTA ) 2 MG TABS tablet [id number]  TAKE 1  TABLET(2 MG) BY MOUTH AT BEDTIME AS NEEDED FOR SLEEP White, Redell LABOR, NP  Active   famotidine  (PEPCID ) 20 MG tablet [id number]  TAKE 1 TABLET(20 MG) BY MOUTH TWICE DAILY [name], MD  Active    fondaparinux  (ARIXTRA ) 10 MG/0.8ML SOLN injection [id number]  Inject 0.8 mLs (10 mg total) into the skin daily. Timmy Maude SAUNDERS, MD  Active   hydrocortisone  (ANUSOL -HC) 2.5 % rectal cream [id number]  PLACE RECTALLY 4 TIMES DAILY AS NEEDED FOR HEMORRHOIDS OR ANAL ITCHING Ennever, Maude SAUNDERS, MD  Active   HYDROmorphone  (DILAUDID ) 4 MG tablet [id number]  Take 1 tablet (4 mg total) by mouth every 6 (six) hours as needed for severe pain (pain score 7-10). Timmy Maude SAUNDERS, MD  Active   insulin  aspart (NOVOLOG  FLEXPEN) 100 UNIT/ML FlexPen [id number]  Max daily 60 units [name], Ibtehal Jaralla, MD  Active   insulin  glargine (LANTUS  SOLOSTAR) 100 UNIT/ML Solostar Pen [id number]  Inject 40 Units into the skin daily. [name], Donell Cardinal, MD  Active   Insulin  Pen Needle (EMBECTA PEN NEEDLE NANO 2 GEN) 32G X 4 MM MISC [id number]  Use to check blood glucose  times a day. [name], Donell Cardinal, MD  Active   Insulin  Pen Needle 32G X 4 MM MISC [id number]  1 Device by Does not apply route in the morning, at noon, in the evening, and at bedtime. [name], Ibtehal Jaralla, MD  Active   lidocaine -hydrocortisone  (ANAMANTLE) 3-1 % KIT [id number]  Place 1 Application rectally 2 (two) times daily. Elnor Jayson LABOR, DO  Active Self, Pharmacy Records  loperamide  (IMODIUM ) 2 MG capsule [id number]  Take 2 capsules (4 mg total) by mouth 4 (four) times daily as needed for diarrhea or loose stools. Cheryle Page, MD  Active Self, Pharmacy Records  meclizine  (ANTIVERT ) 25 MG tablet [id number]  TAKE 1 TABLET(25 MG) BY MOUTH THREE TIMES DAILY AS NEEDED FOR DIZZINESS Ennever, Peter R, MD  Active   mirtazapine  (REMERON ) 15 MG tablet [id number]  Take 1 tablet (15 mg total) by mouth at bedtime. Teresa Redell LABOR, NP  Active   ondansetron  (ZOFRAN ) 8 MG tablet [id number]  TAKE 1 TABLET(8 MG) BY MOUTH EVERY 8 HOURS AS NEEDED FOR NAUSEA OR VOMITING Timmy Maude SAUNDERS, MD  Active   orphenadrine  (NORFLEX ) 100 MG tablet [id number]  TAKE 1  TABLET(100 MG) BY MOUTH AT BEDTIME AS NEEDED FOR MUSCLE SPASMS Ennever, Peter R, MD  Active   pantoprazole  (PROTONIX ) 40 MG tablet [id number]  TAKE 1 TABLET BY MOUTH EVERY DAY BEFORE BREAKFAST [name], MD  Active   pramoxine-hydrocortisone  (PROCTOCREAM-HC) 1-1 % rectal cream [id number]  APPLY RECTALLY TO THE AFFECTED AREA TWICE DAILY Ennever, Peter R, MD  Active Self, Pharmacy Records  promethazine  (PHENERGAN ) 12.5 MG tablet [id number]  TAKE 1 TABLET(12.5 MG) BY MOUTH EVERY 6 HOURS AS NEEDED FOR NAUSEA OR VOMITING Ennever, Maude SAUNDERS, MD  Active   propranolol  (INDERAL ) 20 MG tablet [id number]  Take 1 tablet (20 mg total) by mouth 2 (two) times daily. Teresa Redell LABOR, NP  Active   terconazole  (TERAZOL 7 ) 0.4 % vaginal cream [id number]  Apply topically twice daily for up to 7 days Cleotilde Ronal RAMAN, MD  Active Self, Pharmacy Records           Home Care and Equipment/Supplies: Were Home Health Services Ordered?: Yes Name of Home Health Agency:: [facility]- confirmed has contact information for agency- per discharge papers from [facility] per patient report Has Agency set up a time to come  to your home?: No EMR reviewed for Home Health Orders: Orders present/patient has not received call (refer to CM for follow-up) (declined TOC 30-day program enrollment: provided information about additional [facility] RN CM longitudinal services: states she will think about it and discuss with PCP on 09/26/24 at time of hospital follow up office visit) Any new equipment or medical supplies ordered?: No  Functional Questionnaire: Do you need assistance with bathing/showering or dressing?: No (family assists and supervises as indicated) Do you need assistance with meal preparation?: Yes (family assists and supervises as indicated) Do you need assistance with eating?: No Do you have difficulty maintaining continence: Yes Do you need assistance with getting out of bed/getting out of a chair/moving?: No Do you have difficulty  managing or taking your medications?: Yes (My husband assists me sometimes: we manage the medications together)  Follow up appointments reviewed: PCP Follow-up appointment confirmed?: Yes (care coordination outreach in real-time with scheduling care guide to successfully schedule hospital follow up PCP appointment 09/26/24) Date of PCP follow-up appointment?: 09/26/24 Follow-up Provider: PCP- Dr. Levora Specialist [facility] Follow-up appointment confirmed?: Yes Date of Specialist follow-up appointment?: 09/27/24 Follow-Up Specialty Provider:: [facility] surgical provider Do you need transportation to your follow-up appointment?: No Do you understand care options if your condition(s) worsen?: Yes-patient verbalized understanding  SDOH Interventions Today    Flowsheet Row Most Recent Value  SDOH Interventions   Food Insecurity Interventions Intervention Not Indicated  Housing Interventions Intervention Not Indicated  Transportation Interventions Intervention Not Indicated  [husband continues to provide transportation]  Utilities Interventions Intervention Not Indicated   See TOC assessment tabs for additional assessment/ TOC intervention information  Patient declines need for ongoing/ further care management/ coordination outreach; declines enrollment in 30-day TOC program- declines taking my direct phone number should needs/ concerns arise post-TOC call   Pls call/ message for questions,  Kru Allman Mckinney Saint Hank, RN, BSN, Media Planner  Transitions of Care  [facility] - [facility] [phone]: direct office

## 2024-09-24 ENCOUNTER — Ambulatory Visit: Admitting: Behavioral Health

## 2024-09-24 DIAGNOSIS — K632 Fistula of intestine: Secondary | ICD-10-CM | POA: Insufficient documentation

## 2024-09-24 NOTE — Progress Notes (Signed)
 Pt is normally present for appointment. Has significant serious chronic health problems. No charge this time.

## 2024-09-26 ENCOUNTER — Ambulatory Visit: Admitting: Family Medicine

## 2024-09-26 ENCOUNTER — Inpatient Hospital Stay: Admitting: Family Medicine

## 2024-09-27 ENCOUNTER — Other Ambulatory Visit: Payer: Self-pay | Admitting: Hematology & Oncology

## 2024-09-27 DIAGNOSIS — H8112 Benign paroxysmal vertigo, left ear: Secondary | ICD-10-CM

## 2024-09-30 ENCOUNTER — Telehealth: Payer: Self-pay

## 2024-09-30 ENCOUNTER — Encounter: Payer: Self-pay | Admitting: Family Medicine

## 2024-09-30 ENCOUNTER — Ambulatory Visit: Admitting: Family Medicine

## 2024-09-30 VITALS — BP 116/92 | HR 76 | Temp 98.0°F | Resp 18 | Ht 66.0 in | Wt 229.4 lb

## 2024-09-30 DIAGNOSIS — K632 Fistula of intestine: Secondary | ICD-10-CM

## 2024-09-30 DIAGNOSIS — C181 Malignant neoplasm of appendix: Secondary | ICD-10-CM

## 2024-09-30 DIAGNOSIS — Z23 Encounter for immunization: Secondary | ICD-10-CM

## 2024-09-30 DIAGNOSIS — E1165 Type 2 diabetes mellitus with hyperglycemia: Secondary | ICD-10-CM | POA: Diagnosis not present

## 2024-09-30 DIAGNOSIS — Z794 Long term (current) use of insulin: Secondary | ICD-10-CM | POA: Diagnosis not present

## 2024-09-30 DIAGNOSIS — S39012A Strain of muscle, fascia and tendon of lower back, initial encounter: Secondary | ICD-10-CM | POA: Insufficient documentation

## 2024-09-30 DIAGNOSIS — C799 Secondary malignant neoplasm of unspecified site: Secondary | ICD-10-CM | POA: Insufficient documentation

## 2024-09-30 DIAGNOSIS — I10 Essential (primary) hypertension: Secondary | ICD-10-CM | POA: Diagnosis not present

## 2024-09-30 DIAGNOSIS — E785 Hyperlipidemia, unspecified: Secondary | ICD-10-CM | POA: Insufficient documentation

## 2024-09-30 DIAGNOSIS — C772 Secondary and unspecified malignant neoplasm of intra-abdominal lymph nodes: Secondary | ICD-10-CM | POA: Diagnosis not present

## 2024-09-30 DIAGNOSIS — D497 Neoplasm of unspecified behavior of endocrine glands and other parts of nervous system: Secondary | ICD-10-CM | POA: Insufficient documentation

## 2024-09-30 NOTE — Telephone Encounter (Signed)
 Provided verbal orders, no concerns from Tonya

## 2024-09-30 NOTE — Patient Instructions (Addendum)
 Occasional elevated BP is ok, but if readings remain over 140/90 consistently we may need to increase your amlodipine  - let me know. As long as readings improve, ok to just recheck a reading up to 160/100. If under 100 systolic, let me know.   Keep follow up with your specialists as planned, including reschedule appointment with [facility].   Flu and pneumonia vaccines were given today.  I will see you in 6 months but let me know if there are questions in the meantime.  Happy to see you sooner if needed.   How to Take Your Blood Pressure Blood pressure is a measurement of how strongly your blood is pressing against the walls of your arteries. Arteries are blood vessels that carry blood from your heart throughout your body. Your health care provider takes your blood pressure at each office visit. You can also take your own blood pressure at home with a blood pressure monitor. You may need to take your own blood pressure to: Confirm a diagnosis of high blood pressure (hypertension). Monitor your blood pressure over time. Make sure your blood pressure medicine is working. Supplies needed: Blood pressure monitor. A chair to sit in. This should be a chair where you can sit upright with your back supported. Do not sit on a soft couch or an armchair. Table or desk. Small notebook and pencil or pen. How to prepare To get the most accurate reading, avoid the following for 30 minutes before you check your blood pressure: Drinking caffeine. Drinking alcohol. Eating. Smoking. Exercising. Five minutes before you check your blood pressure: Use the bathroom and urinate so that you have an empty bladder. Sit quietly in a chair. Do not talk. How to take your blood pressure To check your blood pressure, follow the instructions in the manual that came with your blood pressure monitor. If you have a digital blood pressure monitor, the instructions may be as follows: Sit up straight in a chair. Place  your feet on the floor. Do not cross your ankles or legs. Rest your left arm at the level of your heart on a table or desk or on the arm of a chair. Pull up your shirt sleeve. Wrap the blood pressure cuff around the upper part of your left arm, 1 inch (2.5 cm) above your elbow. It is best to wrap the cuff around bare skin. Fit the cuff snugly, but not too tightly, around your arm. You should be able to place only one finger between the cuff and your arm. Position the cord so that it rests in the bend of your elbow. Press the power button. Sit quietly while the cuff inflates and deflates. Read the digital reading on the monitor screen and write the numbers down (record them) in a notebook. Wait 2-3 minutes, then repeat the steps, starting at step 1. What does my blood pressure reading mean? A blood pressure reading consists of a higher number over a lower number. Ideally, your blood pressure should be below 120/80. The first (top) number is called the systolic pressure. It is a measure of the pressure in your arteries as your heart beats. The second (bottom) number is called the diastolic pressure. It is a measure of the pressure in your arteries as the heart relaxes. Blood pressure is classified into four stages. The following are the stages for adults who do not have a short-term serious illness or a chronic condition. Systolic pressure and diastolic pressure are measured in a unit called mm  Hg (millimeters of mercury).  Normal Systolic pressure: below 120. Diastolic pressure: below 80. Elevated Systolic pressure: 120-129. Diastolic pressure: below 80. Hypertension stage 1 Systolic pressure: 130-139. Diastolic pressure: 80-89. Hypertension stage 2 Systolic pressure: 140 or above. Diastolic pressure: 90 or above. You can have elevated blood pressure or hypertension even if only the systolic or only the diastolic number in your reading is higher than normal. Follow these instructions at  home: Medicines Take over-the-counter and prescription medicines only as told by your health care provider. Tell your health care provider if you are having any side effects from blood pressure medicine. General instructions Check your blood pressure as often as recommended by your health care provider. Check your blood pressure at the same time every day. Take your monitor to the next appointment with your health care provider to make sure that: You are using it correctly. It provides accurate readings. Understand what your goal blood pressure numbers are. Keep all follow-up visits. This is important. General tips Your health care provider can suggest a reliable monitor that will meet your needs. There are several types of home blood pressure monitors. Choose a monitor that has an arm cuff. Do not choose a monitor that measures your blood pressure from your wrist or finger. Choose a cuff that wraps snugly, not too tight or too loose, around your upper arm. You should be able to fit only one finger between your arm and the cuff. You can buy a blood pressure monitor at most drugstores or online. Where to find more information American Heart Association: www.heart.org Contact a health care provider if: Your blood pressure is consistently high. Your blood pressure is suddenly low. Get help right away if: Your systolic blood pressure is higher than 180. Your diastolic blood pressure is higher than 120. These symptoms may be an emergency. Get help right away. Call 911. Do not wait to see if the symptoms will go away. Do not drive yourself to the hospital. Summary Blood pressure is a measurement of how strongly your blood is pressing against the walls of your arteries. A blood pressure reading consists of a higher number over a lower number. Ideally, your blood pressure should be below 120/80. Check your blood pressure at the same time every day. Avoid caffeine, alcohol, smoking, and  exercise for 30 minutes prior to checking your blood pressure. These agents can affect the accuracy of the blood pressure reading. This information is not intended to replace advice given to you by your health care provider. Make sure you discuss any questions you have with your health care provider. Document Revised: 06/24/2021 Document Reviewed: 06/24/2021 Elsevier Patient Education  2024 [employer].

## 2024-09-30 NOTE — Progress Notes (Signed)
 Unfortunately was unable to have full colostomy due to  Subjective:  Patient ID: Wendy Barber, female    DOB: 1961/10/15  Age: 63 y.o. MRN: [id number]  CC:  Chief Complaint  Patient presents with   Transitions Of Care    Post surgical appointment - EC Fistula - Admitted [facility] 12/1. Ostomy bag has been leaking. Has appt on Wednesday to look at wound     HPI Wendy Barber presents for  Transition of care visit/hospital follow-up. Here with spouse.  Transition of care phone call noted on 09/18/2024. Wendy Barber  Declined 30-day TOC program.  No acute needs identified.  Hospital notes reviewed from initial admission of 11/18 through 09/13/2024.  Abdominal wall abscess, enterocutaneous fistula Initially admitted for abdominal pain, noted to have abdominal wall abscess at site of midline incision.  Bedside incision and drainage by surgical oncology.  IV antibiotics, wound packing.  Developed low output enterocutaneous fistula at midline wound.  Initial conservative management with dressing changes, without operative intervention deemed feasible initially.  Pain management with oral/IV hydromorphone .  Scheduled acetaminophen .  Hypertension managed with amlodipine , glycemic control with diabetes for sliding scale insulin .  Glucose range of 160-270.  Ophthalmology consulted for new onset bilateral eye blurriness and foreign body sensation recommending frequent teardrops and ointment.  Hemoglobin remained stable, improvement in leukocytosis.  No infectious complications after initial management.  Renal function stable. She was discharged on a Baqsimi nasal spray for any severe low blood sugars.  Dilaudid  4 mg every 6 hours as needed for moderate or severe pain.  Lantus  30 units at bedtime for diabetes. Ostomy supplies also prescribed. Plan for follow-up with surgeon Dr. Claryce on 12/19.  She was readmitted December 1 through December 3.  CT abdomen pelvis on December 2 with persistent ventral abdominal wall  gas and fluid collection, overall decreased size compared to prior imaging.  Leukocytosis of 12.7, mild anemia and hypochloremia were monitored during that admission.  Surgical oncology and wound care teams were consulted without acute surgical intervention recommended.  Wound ostomy care initiated including pouching of the new fistula site and monitoring of output volume.  Antibiotics were discontinued as there was no evidence of undrained abscess.  Wound care regimen of Stomahesive powder, barrier wipes and pouching systems for both upper and lower midline fistula sites.  Today she reports she has had some leaking at times from the ostomy, has an appointment with ostomy clinic, general surgery at [facility] in 2 days, appt with Dr. Claryce on the 19th.   No fever. No new discharge. Same. Has home health nurse.  Has dilaudid , but not needing daily - only at night at times.  Taking robaxin  consistently - cramping in abdomen helped with robaxin  and tylenol .   Appt Friday with hematology - planned bloodwork then.   Has been advised she can not have any further abdominal surgeries, trouble with colostomy as above due to intestinal adhesions and friable.  Diarrhea has lessened somewhat recently.   Lab Results  Component Value Date   WBC 9.7 08/22/2024   HGB 13.1 08/22/2024   HCT 40.0 08/22/2024   MCV 96.2 08/22/2024   PLT 168 08/22/2024   Lab Results  Component Value Date   NA 143 08/22/2024   K 3.7 08/22/2024   CL 110 08/22/2024   CO2 23 08/22/2024    Hypertension: Amlodipine  5 mg daily.  Telephone call today regarding home health and guidance on blood pressure readings.  BP 143/90 on 09/25/2024. Blood pressure has  been running high at times.  Lowest is today - 116/92.  On propranolol  20mg  BID for tremors.   Home readings: 146/93 yesterday.  BP Readings from Last 3 Encounters:  09/30/24 (!) 116/92  08/22/24 123/78  08/22/24 137/76   Lab Results  Component Value Date    CREATININE 0.87 08/22/2024   She is followed by endocrinology with Dr. Sam for diabetes, with Lantus  as above.  NovoLog  sliding scale only. Lantus  30 units daily.  Same variation in control with recent surgery and stress of hospitalizations, appetite change -On low residue, low fiber diet has been challenging. Few 200's at times, but improves quickly.  Has not needed glucagon - no sx lows.  Appt with endocrine in January.   Visit with radiation oncology in November 3, transverse sinus meningioma, 1 month status post SRS, radiation at the November 3 visit.  Was recovering well.  She is followed by oncology, Dr. Timmy, with complicated medical history with history of metastatic appendiceal cancer, recurrent, on anticoagulation with history of superior mesenteric vein thrombosis, and thromboembolism of the right leg, pernicious anemia, prior pulmonary embolism and carcinoid, neuroendocrine carcinoma.  [facility] surgery in [place] in February 2022.  She was continued on Arixtra  10 mg daily.  Chronic diarrhea with ostomy November 6th by Dr. Claryce. Prior somatuline therapy. Plan for repeat scans in few months.  Appt with Dr. Timmy this Friday.   Plans to follow up with psychiatry, will need to reschedule appt.   HM:   Flu vaccine today.  Prevnar 20 today.  Recommended covid booster - no side effects with flu or covid vaccines prior. Covid booster planned at pharmacy.     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 02/29/2024   Hyperprolactinemia 06/23/2023    Pituitary macroadenoma (HCC) 06/23/2023   Pituitary adenoma (HCC) 04/17/2023   Pulmonary emboli (HCC) 12/08/2022   Pulmonary embolism (HCC) 12/07/2022   Thyroid  nodule 12/07/2022   Enteritis 07/23/2022   Fall at home, initial encounter 07/23/2022   AMS (altered mental status) 07/23/2022   Hemorrhoids 06/25/2022   Occasional tremors: Essential tremors 06/23/2022   Hypomagnesemia 06/22/2022   Iron deficiency anemia    Gastritis 06/18/2022   Depression 06/18/2022   UTI (urinary tract infection) 06/16/2022   DVT, bilateral lower limbs (HCC) 06/14/2022   Symptomatic bradycardia 06/13/2022   History of excision of intestinal structure 06/13/2022   CAD (coronary artery disease) 06/13/2022   Acute lower GI bleeding 05/04/2022   AKI (acute kidney injury) 05/04/2022   History of deep vein thrombosis (DVT) of lower extremity 05/04/2022   History of pulmonary embolus (PE) 05/04/2022   Anxiety 05/04/2022   Pernicious anemia 05/14/2021   Iron deficiency anemia due to chronic blood loss 05/14/2021   Abdominal wall abscess    Surgical wound infection 01/16/2021   C. 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   DVT of deep femoral vein, left (HCC) 03/19/2020   Pulmonary  embolism, bilateral (HCC) 03/19/2020   Presence of IVC filter 03/19/2020   Hepatic encephalopathy (HCC) 11/22/2019   Confusion 11/21/2019   Autoimmune hepatitis (HCC) 11/21/2019   Uncontrolled hypertension 11/21/2019   DM2 (diabetes mellitus, type 2) (HCC) 11/21/2019   Closed right ankle fracture 08/27/2019   Acute deep vein thrombosis (DVT) of femoral vein of left lower extremity (HCC) 08/03/2018   History of colon cancer 08/03/2018   History of partial colectomy 08/03/2018   Spinal stenosis  08/03/2018   Morbid obesity (HCC) 08/03/2018   Cerebral meningioma (HCC) 01/05/2018   Arthritis of right shoulder region 02/18/2017   History of renal calculi 02/14/2016   Midline low back pain 02/14/2016   Class 3 obesity (HCC) 01/07/2016   Migraine with aura and without status migrainosus, not intractable 01/07/2016   Chronic fatigue syndrome 09/23/2013   GERD (gastroesophageal reflux disease) 09/23/2013   History of hepatitis A 09/23/2013   Rheumatoid arthritis involving multiple sites (HCC) 09/23/2013   Past Medical History:  Diagnosis Date   Allergy    See allergy list   Anxiety    Have had this for many years   Arthritis    Autoimmune hepatitis (HCC)    Back pain    Cancer (HCC)    pseudomyxoma peritonei, liver   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   Cardiomegaly 12/08/2022   without congestive failure, noted on ECHO   Cataract    Cerebral meningioma (HCC)    Chronic diarrhea    Chronic fatigue syndrome    Clotting disorder 04/2003   Have had several blood clots through the years   Depression    Have had this for many years   Diabetes mellitus without complication (HCC)    DVT of deep femoral vein, left (HCC) 03/19/2020   Fibromyalgia    GERD (gastroesophageal reflux disease)    Goals of care, counseling/discussion 03/19/2020   History of blood transfusion    History of radiation therapy    08/01/24 Brain (SRS) Dr. 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   Migraines    Multiple thyroid  nodules    Pernicious anemia 05/14/2021   Pituitary adenoma (HCC)    PONV (postoperative nausea and vomiting)    not with recent surgeries   Presence of IVC filter 03/19/2020   Pulmonary embolism, bilateral  (HCC) 03/19/2020   Rectal bleed 12/29/2023   Short bowel syndrome, unspecified    Vertigo    Past Surgical History:  Procedure Laterality Date   ABDOMINAL SURGERY  2005   cytoreductive surgery with splenectomy, [facility]   ABDOMINAL SURGERY  2022   cytoreductive surgery with splenectomy, [facility]   ACHILLES TENDON REPAIR Bilateral    APPENDECTOMY  04/26/2003   ARTHROPLASTY Bilateral    Shoulder   BIOPSY THYROID      CARPAL TUNNEL RELEASE Bilateral    possibly right x2   CHOLECYSTECTOMY  07/1994   COLON SURGERY     03/2003, 12/27/2003 & 12/10/2020   CYSTOSCOPY/URETEROSCOPY/HOLMIUM LASER/STENT PLACEMENT Left 03/26/2024   Procedure: CYSTOSCOPY/URETEROSCOPY/HOLMIUM LASER/STENT PLACEMENT;  Surgeon: Elisabeth Valli BIRCH, MD;  Location: [facility];  Service: Urology;  Laterality: Left;  CYSTOSCOPY/LEFT URETEROSCOPY/HOLMIUM LASER/STENT PLACEMENT/RETROGRADE PYELOGRAM   EYE SURGERY     FRACTURE SURGERY  08/23/2019   Rt ankle   IR CATHETER TUBE CHANGE  02/02/2021   IR CATHETER TUBE CHANGE  02/25/2021   IR IMAGING GUIDED PORT INSERTION  06/20/2022   IR RADIOLOGIST EVAL & MGMT  02/24/2021   IR RADIOLOGIST EVAL & MGMT  03/10/2021   IR RADIOLOGIST EVAL & MGMT  06/22/2022   IR THROMBECT VENO MECH MOD SED  06/20/2022   IR US  GUIDE BX ASP/DRAIN  11/25/2019   IR US  GUIDE VASC ACCESS LEFT  06/20/2022   IR US  GUIDE VASC ACCESS RIGHT  06/20/2022   IR US  GUIDE VASC ACCESS RIGHT  06/20/2022   IR VENO/EXT/BI  06/20/2022   IR VENOCAVAGRAM IVC  06/20/2022   JOINT REPLACEMENT Bilateral    knees   KNEE ARTHROSCOPY     ORIF ANKLE FRACTURE Right 08/27/2019   Procedure: OPEN REDUCTION INTERNAL FIXATION RIGHT ANKLE FRACTURE;  Surgeon: Beverley Evalene BIRCH, MD;  Location: [facility];  Service: Orthopedics;  Laterality: Right;   perineorrophy     SMALL INTESTINE SURGERY     03/2004, 12/27/2003 & 12/10/2020   SPINAL FUSION     TARSAL TUNNEL RELEASE Right    TONGUE BIOPSY     TOTAL ABDOMINAL HYSTERECTOMY Bilateral 2004   with BSO  and appendectomy   Allergies  Allergen Reactions   Ativan [Lorazepam] Swelling and Other (See Comments)    Face & Throat Swelling  Note: tolerates midazolam  fine   Corticosteroids Other (See Comments)    Psychotic behavior    Penicillins Shortness Of Breath and Other (See Comments)    Irregular and rapid Heart Rate, too    Alprazolam Hives and Other (See Comments)    Hard to arouse, unresponsiveness also   Erythromycin Nausea And Vomiting        Gabapentin  Other (See Comments)    Made the patient feel depressed   Prednisolone Anxiety   Prednisone Anxiety and Other (See Comments)    Anxiety & Nervous Breakdown   Savella  [Milnacipran] Other (See Comments)    Reaction not noted   Prior to Admission medications   Medication Sig Start Date End Date Taking? Authorizing Provider  acetaminophen  (TYLENOL ) 500 MG tablet Take 1,000 mg by mouth every 6 (six) hours as needed for moderate pain (pain score 4-6).   Yes [provider]  amLODipine  (NORVASC ) 5 MG tablet TAKE 1 TABLET(5 MG) BY MOUTH DAILY 06/06/24  Yes [name], MD  busPIRone  (BUSPAR ) 15 MG tablet Take 1 tablet (15 mg total) by mouth 3 (three) times daily. 07/09/24  Yes Teresa Rogue A, NP  citalopram  (CELEXA ) 40 MG tablet Take 1 tablet (40 mg total) by mouth daily. 07/09/24  Yes White, Rogue A, NP  Continuous Glucose Sensor (DEXCOM G7 SENSOR) MISC 1 Device by Does not apply route as directed. 05/20/24  Yes [name], Ibtehal Jaralla, MD  cyclobenzaprine  (FLEXERIL ) 10 MG tablet TAKE 1/2 TO 1 TABLET(5 TO 10 MG) BY MOUTH THREE TIMES DAILY AS NEEDED FOR MUSCLE SPASMS. START EVERY NIGHT AT BEDTIME AS NEEDED DUE TO SEDATION 08/15/24  Yes [name], MD  diazepam  (VALIUM ) 10 MG tablet Take one pill as needed for procedures including MRI 08/07/24  Yes Maritza Stagger, MD  diphenoxylate -atropine  (LOMOTIL ) 2.5-0.025 MG tablet TAKE 2 TABLETS BY MOUTH FOUR TIMES DAILY AS NEEDED FOR LOOSE STOOLS OR DIARRHEA 06/04/24  Yes  Timmy Maude SAUNDERS, MD  estradiol (ESTRACE) 0.01 % CREA vaginal cream Place 1 Applicatorful vaginally every 14 (fourteen) days. 06/05/24  Yes [provider]  eszopiclone  (LUNESTA ) 2 MG TABS tablet TAKE 1 TABLET(2 MG) BY MOUTH AT BEDTIME AS NEEDED FOR SLEEP 07/09/24  Yes White, Redell A, NP  famotidine  (PEPCID ) 20 MG tablet TAKE 1 TABLET(20 MG) BY MOUTH TWICE DAILY 08/02/24  Yes [name], MD  fondaparinux  (ARIXTRA ) 10 MG/0.8ML SOLN injection Inject 0.8 mLs (10 mg total) into the skin daily. 08/12/24  Yes Timmy Maude SAUNDERS, MD  hydrocortisone  (ANUSOL -HC) 2.5 % rectal cream PLACE RECTALLY 4 TIMES DAILY AS NEEDED FOR HEMORRHOIDS OR ANAL ITCHING 08/15/24  Yes Ennever, Maude SAUNDERS, MD  HYDROmorphone  (DILAUDID ) 4 MG tablet Take 1 tablet (4 mg total) by mouth every 6 (six) hours as needed for severe pain (pain score 7-10). 08/23/24  Yes Timmy Maude SAUNDERS, MD  insulin  aspart (NOVOLOG  FLEXPEN) 100 UNIT/ML FlexPen Max daily 60 units 08/13/24  Yes [name], Ibtehal Jaralla, MD  insulin  glargine (LANTUS  SOLOSTAR) 100 UNIT/ML Solostar Pen Inject 40 Units into the skin daily. 08/13/24  Yes [name], Ibtehal Jaralla, MD  Insulin  Pen Needle (EMBECTA PEN NEEDLE NANO 2 GEN) 32G X 4 MM MISC Use to check blood glucose  times a day. 08/15/24  Yes [name], Ibtehal Jaralla, MD  Insulin  Pen Needle 32G X 4 MM MISC 1 Device by Does not apply route in the morning, at noon, in the evening, and at bedtime. 08/13/24  Yes [name], Ibtehal Jaralla, MD  lidocaine -hydrocortisone  (ANAMANTLE) 3-1 % KIT Place 1 Application rectally 2 (two) times daily. 12/29/23  Yes Elnor Savant A, DO  loperamide  (IMODIUM ) 2 MG capsule Take 2 capsules (4 mg total) by mouth 4 (four) times daily as needed for diarrhea or loose stools. 05/08/22  Yes Cheryle Page, MD  meclizine  (ANTIVERT ) 25 MG tablet TAKE 1 TABLET(25 MG) BY MOUTH THREE TIMES DAILY AS NEEDED FOR DIZZINESS 09/27/24  Yes Ennever, Maude SAUNDERS, MD  methocarbamol  (ROBAXIN ) 500 MG tablet  Take 500 mg by mouth. 09/25/24 10/02/24 Yes [provider]  mirtazapine  (REMERON ) 15 MG tablet Take 1 tablet (15 mg total) by mouth at bedtime. 07/09/24  Yes White, Redell A, NP  ondansetron  (ZOFRAN ) 8 MG tablet TAKE 1 TABLET(8 MG) BY MOUTH EVERY 8 HOURS AS NEEDED FOR NAUSEA OR VOMITING 03/28/24  Yes Ennever, Maude SAUNDERS, MD  orphenadrine  (NORFLEX ) 100 MG tablet TAKE 1 TABLET(100 MG) BY MOUTH AT BEDTIME AS NEEDED FOR MUSCLE SPASMS 08/02/24  Yes Ennever, Maude SAUNDERS, MD  Ostomy Supplies MISC 20 each by Other route. 09/17/24 12/16/24 Yes [provider]  pantoprazole  (PROTONIX ) 40 MG tablet TAKE 1 TABLET BY MOUTH EVERY DAY BEFORE BREAKFAST 05/16/24  Yes [name], MD  pramoxine-hydrocortisone  (PROCTOCREAM-HC) 1-1 % rectal cream APPLY RECTALLY TO THE AFFECTED AREA TWICE DAILY 10/24/23  Yes Ennever, Maude SAUNDERS, MD  promethazine  (PHENERGAN ) 12.5 MG tablet TAKE 1 TABLET(12.5 MG) BY MOUTH EVERY 6 HOURS AS NEEDED FOR NAUSEA OR VOMITING 07/20/24  Yes Timmy Maude SAUNDERS, MD  propranolol  (INDERAL ) 20 MG tablet Take 1 tablet (20 mg total) by mouth 2 (two) times daily. 07/09/24  Yes Teresa Redell A, NP  terconazole  (TERAZOL 7 ) 0.4 % vaginal cream Apply topically twice daily for up to 7 days 03/06/24  Yes Cleotilde Ronal RAMAN, MD  dicyclomine  (BENTYL ) 20 MG tablet TAKE 1 TABLET(20 MG) BY MOUTH THREE TIMES DAILY BEFORE MEALS Patient not taking: Reported on 09/30/2024 01/16/24   Timmy Maude SAUNDERS, MD   Social History   Socioeconomic History   Marital status: Married    Spouse name: Not on file  Number of children: 3   Years of education: Not on file   Highest education level: Some college, no degree  Occupational History   Occupation: disability  Tobacco Use   Smoking status: Never   Smokeless tobacco: Never  Vaping Use   Vaping status: Never Used  Substance and Sexual Activity   Alcohol use: Never   Drug use: Never   Sexual activity: Not Currently    Birth control/protection: Post-menopausal, Surgical     Comment: Husband had vasectomy  Other Topics Concern   Not on file  Social History Narrative   Right handed   One story home   Drinks occasional caffeine   Social Drivers of Health   Financial Resource Strain: Patient Declined (10/16/2023)   Overall Financial Resource Strain (CARDIA)    Difficulty of Paying Living Expenses: Patient declined  Food Insecurity: No Food Insecurity (09/18/2024)   Hunger Vital Sign    Worried About Running Out of Food in the Last Year: Never true    Ran Out of Food in the Last Year: Never true  Transportation Needs: No Transportation Needs (09/18/2024)   PRAPARE - Administrator, Civil Service (Medical): No    Lack of Transportation (Non-Medical): No  Physical Activity: Inactive (10/16/2023)   Exercise Vital Sign    Days of Exercise per Week: 0 days    Minutes of Exercise per Session: 20 min  Stress: Stress Concern Present (10/16/2023)   [employer] of Occupational Health - Occupational Stress Questionnaire    Feeling of Stress : Very much  Social Connections: Socially Integrated (10/16/2023)   Social Connection and Isolation Panel    Frequency of Communication with Friends and Family: More than three times a week    Frequency of Social Gatherings with Friends and Family: Once a week    Attends Religious Services: More than 4 times per year    Active Member of [employer] or Organizations: No    Attends Engineer, Structural: More than 4 times per year    Marital Status: Married  Catering Manager Violence: Not At Risk (09/18/2024)   Humiliation, Afraid, Rape, and Kick questionnaire    Fear of Current or Ex-Partner: No    Emotionally Abused: No    Physically Abused: No    Sexually Abused: No    Review of Systems Per HPI  Objective:   Vitals:   09/30/24 1115  BP: (!) 116/92  Pulse: 76  Resp: 18  Temp: 98 F (36.7 C)  TempSrc: Temporal  SpO2: 96%  Weight: 229 lb 6.4 oz (104.1 kg)  Height: 5' 6 (1.676 m)      Physical Exam Vitals reviewed.  Constitutional:      Appearance: Normal appearance. She is well-developed.  HENT:     Head: Normocephalic and atraumatic.  Eyes:     Conjunctiva/sclera: Conjunctivae normal.     Pupils: Pupils are equal, round, and reactive to light.  Neck:     Vascular: No carotid bruit.  Cardiovascular:     Rate and Rhythm: Normal rate and regular rhythm.     Heart sounds: Normal heart sounds.  Pulmonary:     Effort: Pulmonary effort is normal.     Breath sounds: Normal breath sounds.  Abdominal:     General: There is no distension.     Palpations: There is no pulsatile mass.     Comments:  ostomy bandages in place.  No appreciable surrounding erythema.  Musculoskeletal:     Right lower  leg: No edema.     Left lower leg: No edema.  Skin:    General: Skin is warm and dry.  Neurological:     Mental Status: She is alert and oriented to person, place, and time.  Psychiatric:        Mood and Affect: Mood normal.        Behavior: Behavior normal.        Assessment & Plan:  Bama Hanselman is a 63 y.o. female . 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  Recent hospitalizations for fistula, with 2 sites as above, close follow-up planned for wound care, ostomy nurse, surgical follow-up.  Continue same.  No apparent signs of infection most recently.  Home health in place.  Dilaudid  in place for pain as needed and Robaxin  for abdominal cramping.  Along with Tylenol .  Follow-up with oncology as planned.  Essential hypertension  - Episodic variability in blood pressure, may be related to pain, based on in office readings we will hold on changes for now and parameters given for follow-up if significant deviations from baseline.  Asymptomatic.  Type 2 diabetes mellitus with hyperglycemia, without long-term current use of insulin   (HCC)  - On adjusted dose of insulin  as above, continue follow-up with endocrinology as planned.  Appears to be overall stable with current regimen.  Monitoring with RTC precautions discussed.  Did recommend she follow-up as planned with other specialist and to reschedule visit with psychiatry.  I know she has been through a lot, and think it is important that they continue to follow her closely.  Denies other acute needs at this time.  Flu vaccine need - Plan: Flu vaccine trivalent PF, 6mos and older(Flulaval,Afluria,Fluarix,Fluzone)  Need for prophylactic vaccination against Streptococcus pneumoniae (pneumococcus) - Plan: Pneumococcal conjugate vaccine 20-valent (Prevnar 20)   No orders of the defined types were placed in this encounter.  Patient Instructions   Occasional elevated BP is ok, but if readings remain over 140/90 consistently we may need to increase your amlodipine  - let me know. As long as readings improve, ok to just recheck a reading up to 160/100. If under 100 systolic, let me know.   Keep follow up with your specialists as planned, including reschedule appointment with [facility].   Flu and pneumonia vaccines were given today.  I will see you in 6 months but let me know if there are questions in the meantime.  Happy to see you sooner if needed.   How to Take Your Blood Pressure Blood pressure is a measurement of how strongly your blood is pressing against the walls of your arteries. Arteries are blood vessels that carry blood from your heart throughout your body. Your health care provider takes your blood pressure at each office visit. You can also take your own blood pressure at home with a blood pressure monitor. You may need to take your own blood pressure to: Confirm a diagnosis of high blood pressure (hypertension). Monitor your blood pressure over time. Make sure your blood pressure medicine is working. Supplies needed: Blood pressure monitor. A chair to sit in.  This should be a chair where you can sit upright with your back supported. Do not sit on a soft couch or an armchair. Table or desk. Small notebook and pencil or pen. How to prepare To get the most accurate reading, avoid the following for 30 minutes  before you check your blood pressure: Drinking caffeine. Drinking alcohol. Eating. Smoking. Exercising. Five minutes before you check your blood pressure: Use the bathroom and urinate so that you have an empty bladder. Sit quietly in a chair. Do not talk. How to take your blood pressure To check your blood pressure, follow the instructions in the manual that came with your blood pressure monitor. If you have a digital blood pressure monitor, the instructions may be as follows: Sit up straight in a chair. Place your feet on the floor. Do not cross your ankles or legs. Rest your left arm at the level of your heart on a table or desk or on the arm of a chair. Pull up your shirt sleeve. Wrap the blood pressure cuff around the upper part of your left arm, 1 inch (2.5 cm) above your elbow. It is best to wrap the cuff around bare skin. Fit the cuff snugly, but not too tightly, around your arm. You should be able to place only one finger between the cuff and your arm. Position the cord so that it rests in the bend of your elbow. Press the power button. Sit quietly while the cuff inflates and deflates. Read the digital reading on the monitor screen and write the numbers down (record them) in a notebook. Wait 2-3 minutes, then repeat the steps, starting at step 1. What does my blood pressure reading mean? A blood pressure reading consists of a higher number over a lower number. Ideally, your blood pressure should be below 120/80. The first (top) number is called the systolic pressure. It is a measure of the pressure in your arteries as your heart beats. The second (bottom) number is called the diastolic pressure. It is a measure of the pressure in  your arteries as the heart relaxes. Blood pressure is classified into four stages. The following are the stages for adults who do not have a short-term serious illness or a chronic condition. Systolic pressure and diastolic pressure are measured in a unit called mm Hg (millimeters of mercury).  Normal Systolic pressure: below 120. Diastolic pressure: below 80. Elevated Systolic pressure: 120-129. Diastolic pressure: below 80. Hypertension stage 1 Systolic pressure: 130-139. Diastolic pressure: 80-89. Hypertension stage 2 Systolic pressure: 140 or above. Diastolic pressure: 90 or above. You can have elevated blood pressure or hypertension even if only the systolic or only the diastolic number in your reading is higher than normal. Follow these instructions at home: Medicines Take over-the-counter and prescription medicines only as told by your health care provider. Tell your health care provider if you are having any side effects from blood pressure medicine. General instructions Check your blood pressure as often as recommended by your health care provider. Check your blood pressure at the same time every day. Take your monitor to the next appointment with your health care provider to make sure that: You are using it correctly. It provides accurate readings. Understand what your goal blood pressure numbers are. Keep all follow-up visits. This is important. General tips Your health care provider can suggest a reliable monitor that will meet your needs. There are several types of home blood pressure monitors. Choose a monitor that has an arm cuff. Do not choose a monitor that measures your blood pressure from your wrist or finger. Choose a cuff that wraps snugly, not too tight or too loose, around your upper arm. You should be able to fit only one finger between your arm and the cuff. You can buy a  blood pressure monitor at most drugstores or online. Where to find more  information American Heart Association: www.heart.org Contact a health care provider if: Your blood pressure is consistently high. Your blood pressure is suddenly low. Get help right away if: Your systolic blood pressure is higher than 180. Your diastolic blood pressure is higher than 120. These symptoms may be an emergency. Get help right away. Call 911. Do not wait to see if the symptoms will go away. Do not drive yourself to the hospital. Summary Blood pressure is a measurement of how strongly your blood is pressing against the walls of your arteries. A blood pressure reading consists of a higher number over a lower number. Ideally, your blood pressure should be below 120/80. Check your blood pressure at the same time every day. Avoid caffeine, alcohol, smoking, and exercise for 30 minutes prior to checking your blood pressure. These agents can affect the accuracy of the blood pressure reading. This information is not intended to replace advice given to you by your health care provider. Make sure you discuss any questions you have with your health care provider. Document Revised: 06/24/2021 Document Reviewed: 06/24/2021 Elsevier Patient Education  2024 Elsevier Inc.    Signed,   Reyes Pines, MD [facility], [facility] 09/30/24 12:34 PM

## 2024-09-30 NOTE — Telephone Encounter (Signed)
 Ok for verbal orders.  She has office visit today.  Will discuss blood pressure concerns at that time.

## 2024-09-30 NOTE — Telephone Encounter (Signed)
 Okay for verbal   Copied from CRM [id number]. Topic: Clinical - Home Health Verbal Orders >> Sep 30, 2024  9:53 AM Nessti S wrote:  Caller/Agency: [facility] Number: [phone]  Service Requested: Skilled Nursing  Frequency: 2x a wk for 3 wks then 1x a wk for 6 wks  Any new concerns about the patient? Yes. Bp fluctuates up and down and would like to know when should she call pcp for pt bp.

## 2024-10-02 ENCOUNTER — Other Ambulatory Visit: Payer: Self-pay | Admitting: Behavioral Health

## 2024-10-02 DIAGNOSIS — F411 Generalized anxiety disorder: Secondary | ICD-10-CM

## 2024-10-02 DIAGNOSIS — F331 Major depressive disorder, recurrent, moderate: Secondary | ICD-10-CM

## 2024-10-04 ENCOUNTER — Inpatient Hospital Stay: Admitting: Hematology & Oncology

## 2024-10-04 ENCOUNTER — Inpatient Hospital Stay: Attending: Hematology & Oncology

## 2024-10-04 ENCOUNTER — Inpatient Hospital Stay

## 2024-10-04 ENCOUNTER — Encounter: Payer: Self-pay | Admitting: Hematology & Oncology

## 2024-10-04 ENCOUNTER — Other Ambulatory Visit: Payer: Self-pay

## 2024-10-04 VITALS — BP 117/82 | HR 63 | Temp 98.2°F | Resp 19 | Ht 66.0 in | Wt 231.0 lb

## 2024-10-04 DIAGNOSIS — K55059 Acute (reversible) ischemia of intestine, part and extent unspecified: Secondary | ICD-10-CM | POA: Diagnosis not present

## 2024-10-04 DIAGNOSIS — R109 Unspecified abdominal pain: Secondary | ICD-10-CM | POA: Insufficient documentation

## 2024-10-04 DIAGNOSIS — D500 Iron deficiency anemia secondary to blood loss (chronic): Secondary | ICD-10-CM | POA: Diagnosis not present

## 2024-10-04 DIAGNOSIS — Z881 Allergy status to other antibiotic agents status: Secondary | ICD-10-CM | POA: Diagnosis not present

## 2024-10-04 DIAGNOSIS — Z79899 Other long term (current) drug therapy: Secondary | ICD-10-CM | POA: Diagnosis not present

## 2024-10-04 DIAGNOSIS — C181 Malignant neoplasm of appendix: Secondary | ICD-10-CM

## 2024-10-04 DIAGNOSIS — R300 Dysuria: Secondary | ICD-10-CM | POA: Insufficient documentation

## 2024-10-04 DIAGNOSIS — I82412 Acute embolism and thrombosis of left femoral vein: Secondary | ICD-10-CM

## 2024-10-04 DIAGNOSIS — D510 Vitamin B12 deficiency anemia due to intrinsic factor deficiency: Secondary | ICD-10-CM | POA: Insufficient documentation

## 2024-10-04 DIAGNOSIS — Z880 Allergy status to penicillin: Secondary | ICD-10-CM | POA: Insufficient documentation

## 2024-10-04 DIAGNOSIS — D3A8 Other benign neuroendocrine tumors: Secondary | ICD-10-CM | POA: Diagnosis not present

## 2024-10-04 DIAGNOSIS — L0291 Cutaneous abscess, unspecified: Secondary | ICD-10-CM | POA: Diagnosis not present

## 2024-10-04 DIAGNOSIS — I82401 Acute embolism and thrombosis of unspecified deep veins of right lower extremity: Secondary | ICD-10-CM | POA: Diagnosis not present

## 2024-10-04 DIAGNOSIS — Z7901 Long term (current) use of anticoagulants: Secondary | ICD-10-CM | POA: Insufficient documentation

## 2024-10-04 DIAGNOSIS — Z888 Allergy status to other drugs, medicaments and biological substances status: Secondary | ICD-10-CM | POA: Insufficient documentation

## 2024-10-04 DIAGNOSIS — C772 Secondary and unspecified malignant neoplasm of intra-abdominal lymph nodes: Secondary | ICD-10-CM

## 2024-10-04 DIAGNOSIS — M549 Dorsalgia, unspecified: Secondary | ICD-10-CM | POA: Insufficient documentation

## 2024-10-04 DIAGNOSIS — I2699 Other pulmonary embolism without acute cor pulmonale: Secondary | ICD-10-CM | POA: Insufficient documentation

## 2024-10-04 DIAGNOSIS — L905 Scar conditions and fibrosis of skin: Secondary | ICD-10-CM | POA: Insufficient documentation

## 2024-10-04 LAB — CMP (CANCER CENTER ONLY)
ALT: 24 U/L (ref 0–44)
AST: 49 U/L — ABNORMAL HIGH (ref 15–41)
Albumin: 3.6 g/dL (ref 3.5–5.0)
Alkaline Phosphatase: 258 U/L — ABNORMAL HIGH (ref 38–126)
Anion gap: 12 (ref 5–15)
BUN: 23 mg/dL (ref 8–23)
CO2: 25 mmol/L (ref 22–32)
Calcium: 9.3 mg/dL (ref 8.9–10.3)
Chloride: 103 mmol/L (ref 98–111)
Creatinine: 0.78 mg/dL (ref 0.44–1.00)
GFR, Estimated: >60 mL/min (ref >60)
Glucose, Bld: 166 mg/dL — ABNORMAL HIGH (ref 70–99)
Potassium: 4.1 mmol/L (ref 3.5–5.1)
Sodium: 140 mmol/L (ref 135–145)
Total Bilirubin: 0.4 mg/dL (ref 0.0–1.2)
Total Protein: 7.5 g/dL (ref 6.5–8.1)

## 2024-10-04 LAB — CBC WITH DIFFERENTIAL (CANCER CENTER ONLY)
Abs Immature Granulocytes: 0.02 K/uL (ref 0.00–0.07)
Basophils Absolute: 0.1 K/uL (ref 0.0–0.1)
Basophils Relative: 1 %
Eosinophils Absolute: 0.7 K/uL — ABNORMAL HIGH (ref 0.0–0.5)
Eosinophils Relative: 7 %
HCT: 41.6 % (ref 36.0–46.0)
Hemoglobin: 13.3 g/dL (ref 12.0–15.0)
Immature Granulocytes: 0 %
Lymphocytes Relative: 37 %
Lymphs Abs: 3.7 K/uL (ref 0.7–4.0)
MCH: 31.4 pg (ref 26.0–34.0)
MCHC: 32 g/dL (ref 30.0–36.0)
MCV: 98.1 fL (ref 80.0–100.0)
Monocytes Absolute: 1 K/uL (ref 0.1–1.0)
Monocytes Relative: 10 %
Neutro Abs: 4.5 K/uL (ref 1.7–7.7)
Neutrophils Relative %: 45 %
Platelet Count: 347 K/uL (ref 150–400)
RBC: 4.24 MIL/uL (ref 3.87–5.11)
RDW: 14.3 % (ref 11.5–15.5)
WBC Count: 9.9 K/uL (ref 4.0–10.5)
nRBC: 0 % (ref 0.0–0.2)

## 2024-10-04 LAB — VITAMIN B12: Vitamin B-12: 298 pg/mL (ref 180–914)

## 2024-10-04 LAB — IRON AND IRON BINDING CAPACITY (CC-WL,HP ONLY)
Iron: 73 ug/dL (ref 28–170)
Saturation Ratios: 24 % (ref 10.4–31.8)
TIBC: 301 ug/dL (ref 250–450)
UIBC: 228 ug/dL

## 2024-10-04 LAB — FERRITIN: Ferritin: 209 ng/mL (ref 11–307)

## 2024-10-04 LAB — MAGNESIUM: Magnesium: 2.1 mg/dL (ref 1.7–2.4)

## 2024-10-04 MED ORDER — HYDROMORPHONE HCL 4 MG PO TABS: 4.0000 mg | ORAL_TABLET | Freq: Four times a day (QID) | ORAL | 0 refills | Status: DC | PRN

## 2024-10-04 MED ORDER — PANTOPRAZOLE SODIUM 40 MG PO TBEC: 40.0000 mg | DELAYED_RELEASE_TABLET | Freq: Every day | ORAL | 1 refills | Status: AC

## 2024-10-04 NOTE — Patient Instructions (Signed)

## 2024-10-04 NOTE — Progress Notes (Signed)
 Hematology and Oncology Follow Up Visit  Wendy Barber [id number] 03/09/1961 63 y.o. 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  Unfortunately, despite his best efforts, Wendy Barber at [facility] could not do a colostomy on her.  He said there is too much scar tissue and too much friability of her intestines.  He really had a difficult time with surgery.  She has had problems with healing.  She has been hospitalized I think 2 times after surgery because of an abscess.  She also now has a fistula down in the hyper umbilical area.  I just feel bad for her.  I thought it was incredibly compassionate to try to do a colostomy for her so she could manage her diarrhea.  Again, it seems like the surgery was going to be able to be done but when he got into her abdomen there is so much scar tissue and so much post surgical changes from past surgeries that he just could not do what he would like to do.  For right now, she just is having a lot of challenges.  Thankfully, her husband is doing a very good job trying to help her.  I know that she does see an ostomy nurse.  I think she sees Wendy Barber next week.  Thankfully, she has had no problems with thromboembolic disease.  She is on Arixtra .  It would not surprise me if she has occasional bleeding from being on Arixtra .  However, I think she has to be on Arixtra  because of her significant thrombotic risk.  She has had decreased appetite.  I do not think she has lost  much weight at the present time.  She still has a lot of pain.  We did give her hydromorphone  which does seem to help her pain.  Of note, her last Chromogranin A level was 50 back in October.  At the present time, I would have to say that her performance status is probably ECOG 2.    Wt Readings from Last 3 Encounters:  09/30/24 229 lb 6.4 oz (104.1 kg)  08/22/24 238 lb 12.8 oz (108.3 kg)  07/29/24 237 lb (107.5 kg)    Medications:  Current Outpatient Medications:    acetaminophen  (TYLENOL ) 500 MG tablet, Take 1,000 mg by mouth every 6 (six) hours as needed for moderate pain (pain score 4-6)., Disp: , Rfl:    amLODipine  (NORVASC ) 5 MG tablet, TAKE 1 TABLET(5 MG) BY MOUTH DAILY, Disp: 90 tablet, Rfl: 1   busPIRone  (BUSPAR ) 15 MG tablet, Take 1 tablet (15 mg total) by mouth 3 (three) times daily., Disp: 270 tablet, Rfl: 0   citalopram  (CELEXA ) 40 MG tablet, TAKE 1 TABLET(40 MG) BY MOUTH DAILY, Disp: 30 tablet, Rfl: 0   Continuous Glucose Sensor (DEXCOM G7 SENSOR) MISC, 1 Device by Does not apply route as directed., Disp: 9 each, Rfl: 3   cyclobenzaprine  (FLEXERIL ) 10 MG tablet, TAKE 1/2 TO 1 TABLET(5 TO 10 MG) BY MOUTH THREE TIMES DAILY AS NEEDED FOR MUSCLE SPASMS. START EVERY NIGHT AT BEDTIME  AS NEEDED DUE TO SEDATION, Disp: 30 tablet, Rfl: 0   diazepam  (VALIUM ) 10 MG tablet, Take one pill as needed for procedures including MRI, Disp: 3 tablet, Rfl: 0   dicyclomine  (BENTYL ) 20 MG tablet, TAKE 1 TABLET(20 MG) BY MOUTH THREE TIMES DAILY BEFORE MEALS (Patient not taking: Reported on 09/30/2024), Disp: 90 tablet, Rfl: 4   diphenoxylate -atropine  (LOMOTIL ) 2.5-0.025 MG tablet, TAKE 2 TABLETS BY MOUTH FOUR TIMES DAILY AS NEEDED FOR LOOSE STOOLS OR DIARRHEA, Disp: 240 tablet, Rfl: 2   estradiol (ESTRACE) 0.01 % CREA vaginal cream, Place 1 Applicatorful vaginally every 14 (fourteen) days., Disp: , Rfl:    eszopiclone  (LUNESTA ) 2 MG TABS tablet, TAKE 1 TABLET(2 MG) BY MOUTH AT BEDTIME AS NEEDED FOR  SLEEP, Disp: 30 tablet, Rfl: 3   famotidine  (PEPCID ) 20 MG tablet, TAKE 1 TABLET(20 MG) BY MOUTH TWICE DAILY, Disp: 180 tablet, Rfl: 1   fondaparinux  (ARIXTRA ) 10 MG/0.8ML SOLN injection, Inject 0.8 mLs (10 mg total) into the skin daily., Disp: 72 mL, Rfl: 3   hydrocortisone  (ANUSOL -HC) 2.5 % rectal cream, PLACE RECTALLY 4 TIMES DAILY AS NEEDED FOR HEMORRHOIDS OR ANAL ITCHING, Disp: 30 g, Rfl: 1   HYDROmorphone  (DILAUDID ) 4 MG tablet, Take 1 tablet (4 mg total) by mouth every 6 (six) hours as needed for severe pain (pain score 7-10)., Disp: 60 tablet, Rfl: 0   insulin  aspart (NOVOLOG  FLEXPEN) 100 UNIT/ML FlexPen, Max daily 60 units, Disp: 60 mL, Rfl: 3   insulin  glargine (LANTUS  SOLOSTAR) 100 UNIT/ML Solostar Pen, Inject 40 Units into the skin daily., Disp: 45 mL, Rfl: 4   Insulin  Pen Needle (EMBECTA PEN NEEDLE NANO 2 GEN) 32G X 4 MM MISC, Use to check blood glucose  times a day., Disp: 300 each, Rfl: 1   Insulin  Pen Needle 32G X 4 MM MISC, 1 Device by Does not apply route in the morning, at noon, in the evening, and at bedtime., Disp: 400 each, Rfl: 3   lidocaine -hydrocortisone  (ANAMANTLE) 3-1 % KIT, Place 1 Application rectally 2 (two) times daily., Disp: 1 kit, Rfl: 0   loperamide  (IMODIUM ) 2 MG capsule, Take 2 capsules (4 mg total) by mouth 4 (four) times daily as needed for diarrhea or loose stools., Disp: , Rfl:    meclizine  (ANTIVERT ) 25 MG tablet, TAKE 1 TABLET(25 MG) BY MOUTH THREE TIMES DAILY AS NEEDED FOR DIZZINESS, Disp: 60 tablet, Rfl: 0   mirtazapine  (REMERON ) 15 MG tablet, Take 1 tablet (15 mg total) by mouth at bedtime., Disp: 90 tablet, Rfl: 1   ondansetron  (ZOFRAN ) 8 MG tablet, TAKE 1 TABLET(8 MG) BY MOUTH EVERY 8 HOURS AS NEEDED FOR NAUSEA OR VOMITING, Disp: 30 tablet, Rfl: 2   orphenadrine  (NORFLEX ) 100 MG tablet, TAKE 1 TABLET(100 MG) BY MOUTH AT BEDTIME AS NEEDED FOR MUSCLE SPASMS, Disp: 30 tablet, Rfl: 2   Ostomy Supplies MISC, 20 each by Other route., Disp: , Rfl:     pantoprazole  (PROTONIX ) 40 MG tablet, Take 1 tablet (40 mg total) by mouth daily., Disp: 90 tablet, Rfl: 1   pramoxine-hydrocortisone  (PROCTOCREAM-HC) 1-1 % rectal cream, APPLY RECTALLY TO THE AFFECTED AREA TWICE DAILY, Disp: 30 g, Rfl: 3   promethazine  (PHENERGAN ) 12.5 MG tablet, TAKE 1 TABLET(12.5 MG) BY MOUTH EVERY 6 HOURS AS NEEDED FOR NAUSEA OR VOMITING, Disp: 90 tablet, Rfl: 3   propranolol  (INDERAL ) 20 MG tablet, Take 1 tablet (20 mg total) by mouth 2 (two) times daily., Disp: 180 tablet, Rfl: 1   terconazole  (TERAZOL 7 ) 0.4 %  vaginal cream, Apply topically twice daily for up to 7 days, Disp: 45 g, Rfl: 2  Allergies:  Allergies  Allergen Reactions   Ativan [Lorazepam] Swelling and Other (See Comments)    Face & Throat Swelling  Note: tolerates midazolam  fine   Corticosteroids Other (See Comments)    Psychotic behavior    Penicillins Shortness Of Breath and Other (See Comments)    Irregular and rapid Heart Rate, too    Alprazolam Hives and Other (See Comments)    Hard to arouse, unresponsiveness also   Erythromycin Nausea And Vomiting        Gabapentin  Other (See Comments)    Made the patient feel depressed   Prednisolone Anxiety   Prednisone Anxiety and Other (See Comments)    Anxiety & Nervous Breakdown   Savella  [Milnacipran] Other (See Comments)    Reaction not noted    Past Medical History, Surgical history, Social history, and Family History were reviewed and updated.  Review of Systems: Review of Systems  Constitutional:  Positive for fatigue and unexpected weight change.  HENT:  Negative.    Eyes: Negative.   Respiratory: Negative.    Cardiovascular: Negative.   Gastrointestinal:  Positive for abdominal pain and diarrhea.  Endocrine: Negative.   Genitourinary:  Positive for dysuria.   Musculoskeletal:  Positive for back pain.  Skin: Negative.   Neurological: Negative.   Hematological: Negative.   Psychiatric/Behavioral:  Negative for depression.      Physical Exam:   Her vital signs show temperature of 98.2.  Pulse 63.  Blood pressure 117/82.  Weight is 231 pounds.     Wt Readings from Last 3 Encounters:  09/30/24 229 lb 6.4 oz (104.1 kg)  08/22/24 238 lb 12.8 oz (108.3 kg)  07/29/24 237 lb (107.5 kg)    Physical Exam Vitals reviewed.  HENT:     Head: Normocephalic and atraumatic.  Eyes:     Pupils: Pupils are equal, round, and reactive to light.  Cardiovascular:     Rate and Rhythm: Normal rate and regular rhythm.     Heart sounds: Normal heart sounds.  Pulmonary:     Effort: Pulmonary effort is normal.     Breath sounds: Normal breath sounds.  Abdominal:     General: Bowel sounds are normal.     Palpations: Abdomen is soft.     Comments: Abdominal exam shows a dressing on the upper abdominal incision.  She does have a little fistula that is draining below the umbilicus.  There is no guarding or rebound tenderness to palpation.  Her bowel sounds might be a little bit decreased.  There is no obvious fluid wave.  I cannot palpate her liver or spleen.  Musculoskeletal:        General: No tenderness or deformity. Normal range of motion.     Cervical back: Normal range of motion.  Lymphadenopathy:     Cervical: No cervical adenopathy.  Skin:    General: Skin is warm and dry.     Findings: No erythema or rash.  Neurological:     Mental Status: She is alert and oriented to person, place, and time.  Psychiatric:        Behavior: Behavior normal.        Thought Content: Thought content normal.        Judgment: Judgment normal.    Lab Results  Component Value Date   WBC 9.9 10/04/2024   HGB 13.3 10/04/2024   HCT 41.6 10/04/2024  MCV 98.1 10/04/2024   PLT 347 10/04/2024     Chemistry      Component Value Date/Time   NA 140 10/04/2024 1201   NA 137 04/24/2023 1437   K 4.1 10/04/2024 1201   CL 103 10/04/2024 1201   CO2 25 10/04/2024 1201   BUN 23 10/04/2024 1201   BUN 18 04/24/2023 1437   CREATININE 0.78  10/04/2024 1201   CREATININE 0.96 01/19/2024 1415      Component Value Date/Time   CALCIUM  9.3 10/04/2024 1201   ALKPHOS 258 (H) 10/04/2024 1201   AST 49 (H) 10/04/2024 1201   ALT 24 10/04/2024 1201   BILITOT 0.4 10/04/2024 1201      Impression and Plan: Ms. Ostermann is a very charming 63 year-old white female. She has recurrent appendiceal cancer.  She underwent a HIPEC procedure in [place] a couple years ago. Unfortunately she had had chronic diarrhea since including C. Diff which has been successfully treatment.   Again, there was nothing else I can be done for her diarrhea.  I know that she was attempted with surgery but again there is so much scar tissue and passed surgical changes.  Hopefully, the abdominal wound will heal up.  Hopefully, the fistula will also close up.  As far as her carcinoid, I really do not think we have to do anything for this right now.  I just hate this for her.  I know that she is trying her best.  At least, there is no problems with thromboembolism.  I know that she is certainly is at risk for this.  She is on Arixtra  and I think doing well with this.  I would like to probably see her back in a good 6 weeks.  I will send in a prescription for the hydromorphone  for her.  I know this helps with her pain.   Maude JONELLE Crease, MD 12/12/20251:22 PM

## 2024-10-05 ENCOUNTER — Encounter: Payer: Self-pay | Admitting: Family Medicine

## 2024-10-07 LAB — CHROMOGRANIN A: Chromogranin A (ng/mL): 69.6 ng/mL (ref 0.0–101.8)

## 2024-10-08 ENCOUNTER — Telehealth: Payer: Self-pay | Admitting: Family Medicine

## 2024-10-08 NOTE — Telephone Encounter (Signed)
 Type of form received: [facility]  Additional comments:   Received by: Fax  Form should be Faxed/mailed to: (address/ fax #) [phone]  Is patient requesting call for pickup:  Form placed:  Provider's bin  Attach charge sheet.  Provider will determine charge.  Individual made aware of 3-5 business day turn around Yes?

## 2024-10-09 NOTE — Telephone Encounter (Signed)
 Forms have been placed in sign folder at nurses station

## 2024-10-09 NOTE — Telephone Encounter (Signed)
 Paperwork completed and placed in fax bin at back nurse station

## 2024-10-14 ENCOUNTER — Telehealth: Payer: Self-pay | Admitting: Family Medicine

## 2024-10-14 NOTE — Telephone Encounter (Signed)
 Type of form received: [facility] Certification and Plan of Care  Additional comments:   Received by: fax  Form should be Faxed/mailed to: (address/ fax #) [phone]  Is patient requesting call for pickup:  Form placed:  Providers bin  Attach charge sheet.  Provider will determine charge.  Individual made aware of 3-5 business day turn around Yes?

## 2024-10-14 NOTE — Telephone Encounter (Signed)
 Placed in sign folder at nurses station

## 2024-10-14 NOTE — Telephone Encounter (Signed)
 Type of form received: [facility]  Additional comments:   Received by: Fax  Form should be Faxed/mailed to: (address/ fax #) [phone]  Is patient requesting call for pickup:  Form placed:  Provider's bin  Attach charge sheet.  Provider will determine charge.  Individual made aware of 3-5 business day turn around Yes?

## 2024-10-14 NOTE — Telephone Encounter (Signed)
 Obtained documents and placed in providers folder at nurse station

## 2024-10-15 ENCOUNTER — Other Ambulatory Visit (HOSPITAL_COMMUNITY): Payer: Self-pay

## 2024-10-15 MED ORDER — FONDAPARINUX SODIUM 7.5 MG/0.6ML ~~LOC~~ SOLN
11.0000 mg | Freq: Every day | SUBCUTANEOUS | 4 refills | Status: AC
  Filled 2024-10-15: qty 72, 90d supply, fill #0
  Filled 2024-10-18: qty 70.4, 88d supply, fill #0
  Filled 2024-10-18: qty 1.6, 2d supply, fill #0
  Filled 2024-10-22: qty 72, 90d supply, fill #1

## 2024-10-16 ENCOUNTER — Other Ambulatory Visit (HOSPITAL_COMMUNITY): Payer: Self-pay

## 2024-10-18 ENCOUNTER — Telehealth: Payer: Self-pay | Admitting: Family Medicine

## 2024-10-18 ENCOUNTER — Other Ambulatory Visit: Payer: Self-pay

## 2024-10-18 ENCOUNTER — Other Ambulatory Visit (HOSPITAL_COMMUNITY): Payer: Self-pay

## 2024-10-18 NOTE — Telephone Encounter (Signed)
 Forms completed and returned to [place]

## 2024-10-18 NOTE — Telephone Encounter (Signed)
 Placed in providers folder for review at nurse station

## 2024-10-18 NOTE — Telephone Encounter (Signed)
 Type of form received: [facility] Certification and Plan of Care  Additional comments:   Received by: Fax  Form should be Faxed/mailed to: (address/ fax #) [phone]  Is patient requesting call for pickup:  Form placed:  Provider bin  Attach charge sheet.  Provider will determine charge.  Individual made aware of 3-5 business day turn around Yes?

## 2024-10-18 NOTE — Telephone Encounter (Signed)
 Forms have been faxed and scanned to provider folder

## 2024-10-19 ENCOUNTER — Other Ambulatory Visit: Payer: Self-pay | Admitting: Hematology & Oncology

## 2024-10-19 DIAGNOSIS — H8112 Benign paroxysmal vertigo, left ear: Secondary | ICD-10-CM

## 2024-10-21 ENCOUNTER — Other Ambulatory Visit (HOSPITAL_COMMUNITY): Payer: Self-pay

## 2024-10-21 ENCOUNTER — Telehealth: Payer: Self-pay | Admitting: Family Medicine

## 2024-10-21 ENCOUNTER — Other Ambulatory Visit: Payer: Self-pay

## 2024-10-21 NOTE — Telephone Encounter (Signed)
 Paperwork completed and placed in fax bin at back nurse station

## 2024-10-21 NOTE — Telephone Encounter (Signed)
 Type of form received: [facility]  Additional comments:   Received by: fax  Form should be Faxed/mailed to: (address/ fax #) [phone]  Is patient requesting call for pickup:  Form placed:  provider bin  Attach charge sheet.  Provider will determine charge.  Individual made aware of 3-5 business day turn around Yes?

## 2024-10-21 NOTE — Telephone Encounter (Signed)
 Received forms on Friday. Dr. Levora will review upon his return.   Copied from CRM #[id number]. Topic: General - Other >> Oct 21, 2024  2:02 PM Thersia BROCKS wrote: Reason for CRM: Apolinar from [facility] called in stated they need the home health orders signed by Dr.Greene , will be refaxing them

## 2024-10-21 NOTE — Telephone Encounter (Signed)
 Obtained and placed in providers folder at nurse station.

## 2024-10-21 NOTE — Telephone Encounter (Signed)
 See other message. These were resent, completed and placed in fax bin at back nurse station

## 2024-10-22 ENCOUNTER — Encounter: Payer: Self-pay | Admitting: Hematology & Oncology

## 2024-10-22 ENCOUNTER — Other Ambulatory Visit (HOSPITAL_COMMUNITY): Payer: Self-pay

## 2024-10-22 ENCOUNTER — Other Ambulatory Visit: Payer: Self-pay

## 2024-10-23 ENCOUNTER — Telehealth: Payer: Self-pay | Admitting: Family Medicine

## 2024-10-23 ENCOUNTER — Other Ambulatory Visit (HOSPITAL_COMMUNITY): Payer: Self-pay

## 2024-10-23 NOTE — Telephone Encounter (Signed)
 Type of form received: [facility]  Additional comments:   Received by: Fax  Form should be Faxed/mailed to: (address/ fax #) [phone]  Is patient requesting call for pickup:  Form placed:  Provider bin  Attach charge sheet.  Provider will determine charge.  Individual made aware of 3-5 business day turn around Yes?

## 2024-10-23 NOTE — Telephone Encounter (Signed)
 Received document last week and last signed off, it was faxed back twice

## 2024-10-29 ENCOUNTER — Encounter: Payer: Self-pay | Admitting: Hematology & Oncology

## 2024-10-29 ENCOUNTER — Ambulatory Visit: Admitting: Internal Medicine

## 2024-10-29 ENCOUNTER — Encounter: Payer: Self-pay | Admitting: Internal Medicine

## 2024-10-29 VITALS — BP 138/100 | Ht 66.0 in | Wt 230.0 lb

## 2024-10-29 DIAGNOSIS — E1165 Type 2 diabetes mellitus with hyperglycemia: Secondary | ICD-10-CM | POA: Diagnosis not present

## 2024-10-29 DIAGNOSIS — D352 Benign neoplasm of pituitary gland: Secondary | ICD-10-CM | POA: Diagnosis not present

## 2024-10-29 DIAGNOSIS — R7989 Other specified abnormal findings of blood chemistry: Secondary | ICD-10-CM | POA: Diagnosis not present

## 2024-10-29 DIAGNOSIS — Z794 Long term (current) use of insulin: Secondary | ICD-10-CM | POA: Diagnosis not present

## 2024-10-29 DIAGNOSIS — E041 Nontoxic single thyroid nodule: Secondary | ICD-10-CM

## 2024-10-29 LAB — POCT GLYCOSYLATED HEMOGLOBIN (HGB A1C): Hemoglobin A1C: 7.8 % — AB (ref 4.0–5.6)

## 2024-10-29 NOTE — Progress Notes (Signed)
 "   Name: Wendy Barber  MRN/ DOB: [id number], 1961/10/21    Age/ Sex: 64 y.o., female    PCP: [name], MD   Reason for Endocrinology Evaluation: Pituitary adenoma     Date of Initial Endocrinology Evaluation: 04/17/2023    HPI: [name] is a 64 y.o. female with a past medical history of HTN, CAD, Hx DVT/PE, Cancer of appendic with mets , autoimmune hepatitis and DM. The patient presented for initial endocrinology clinic visit on 04/17/2023 for consultative assistance with her pituitary adenoma.   During evaluation by neurology for vertigo and cerebral meningioma she was noted to have a pituitary adenoma on brain MRI 2023    Patient follows with oncology for history of metastatic appendiceal cancer, s/p surgery 2022    Patient had an incidental finding of thyroid  nodule on CT scan which prompted thyroid  ultrasound 11/2022, revealing a right inferior 2.3 cm nodule meeting follow-up criteria.  S/p benign FNA of the right inferior nodule 02/27/2024  S/P hysterectomy 2004 , no HRT     Pituitary labs showed elevated ACTH  at 76.3 pg/mL ( 7.2-63.3) , 24 hr urine cortisol was normal , she also had elevated Prolactin 49.9 ng/dL ),   IGF -1 was normal as well as TFT's.   Started cabergoline  05/2023 with a prolactin of 49.9 NG/mL  24-hour urinary cortisol normal at 9.3 on 01/23/2024, serum cortisol normal at 15.6 mcg   DM HISTORY: She has been diagnosed with DM many years ago, she was on insulin  at some point ( basal/prandial )which was discontinued in 2021, prior to cancer surgery.   Her BG's have remained stable until 2024 when she was noted with hypoglycemia again.   She was started on glimepiride  by her oncologist in 2024 I switch from glimepiride  to glipizide  12/2023   Started basal insulin  July, 2025 with an A1c of 9.3%   Discontinue glipizide  and start her prandial insulin  07/2024  SUBJECTIVE:      Today (10/29/2024):  Wendy Barber is here for follow-up on  persistent hyperglycemia.  Patient checks glucose multiple times daily through the Dexcom, has been noted   The patient continues to follow-up with general surgery, she is s/p exploratory laparotomy with lysis of adhesion and primary enterotomy repair November, 2025 .  Her postop course has been complicated with  EC fistula, which was drained 09/2024, and currently following up with wound care, for persistent drainage.  She has central line debating TPN  Diarrhea has improved Has occasional nausea but no vomiting  Continues with abdominal pain   She continues to follow-up with oncology for recurrent appendiceal cancer.  Somatuline was discontinued 07/2024 She did receive SRS for meningioma 07/2024, she has a follow-up MRI in 6 months  The patient was discharged on a smaller dose of Lantus , prandial dose was removed but she continue to use correction scale  HOME DIABETES REGIMEN:  Lantus  30 units daily NovoLog  10 units with each meal- not taking  CF: NovoLog  (BG -130/25) TIDQAC  Statin: no ACE-I/ARB: no Prior Diabetic Education: yes   CONTINUOUS GLUCOSE MONITORING RECORD INTERPRETATION    Dates of Recording: 12/24-10/29/2024  Sensor description: dexcom  Results statistics:   CGM use % of time 95  Average and SD 206/56  Time in range 31 %  % Time Above 180 47  % Time above 250 22  % Time Below target 0   Glycemic patterns summary: BGs trend down overnight and fluctuate during the day Hyperglycemic episodes during  the day  Hypoglycemic episodes occurred rare overnight  Overnight periods: Trends down to normal     DIABETIC COMPLICATIONS: Microvascular complications:   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 Denies: CKD Last Eye Exam: Completed 07/08/2024  Macrovascular complications:   Denies: CAD, CVA, PVD     HISTORY:  Past Medical History:  Past Medical History:  Diagnosis Date   Allergy    See allergy list   Anxiety    Have had this for many  years   Arthritis    Autoimmune hepatitis (HCC)    Back pain    Cancer (HCC)    pseudomyxoma peritonei, liver   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   Cardiomegaly 12/08/2022   without congestive failure, noted on ECHO   Cataract    Cerebral meningioma (HCC)    Chronic diarrhea    Chronic fatigue syndrome    Clotting disorder 04/2003   Have had several blood clots through the years   Depression    Have had this for many years   Diabetes mellitus without complication (HCC)    DVT of deep femoral vein, left (HCC) 03/19/2020   Fibromyalgia    GERD (gastroesophageal reflux disease)    Goals of care, counseling/discussion 03/19/2020   History of blood transfusion    History of radiation therapy    08/01/24 Brain (SRS) Dr. 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   Migraines    Multiple thyroid  nodules    Pernicious anemia 05/14/2021   Pituitary adenoma (HCC)    PONV (postoperative nausea and vomiting)    not with recent surgeries   Presence of IVC filter 03/19/2020   Pulmonary embolism, bilateral (HCC) 03/19/2020   Rectal bleed 12/29/2023   Short bowel syndrome, unspecified    Vertigo    Past Surgical History:  Past Surgical History:  Procedure Laterality Date   ABDOMINAL SURGERY  2005   cytoreductive surgery with splenectomy, [facility]   ABDOMINAL SURGERY  2022   cytoreductive surgery with splenectomy, [facility]   ACHILLES TENDON REPAIR Bilateral    APPENDECTOMY  04/26/2003   ARTHROPLASTY Bilateral    Shoulder   BIOPSY THYROID      CARPAL TUNNEL RELEASE Bilateral    possibly right x2   CHOLECYSTECTOMY  07/1994   COLON SURGERY     03/2003, 12/27/2003 & 12/10/2020   CYSTOSCOPY/URETEROSCOPY/HOLMIUM LASER/STENT  PLACEMENT Left 03/26/2024   Procedure: CYSTOSCOPY/URETEROSCOPY/HOLMIUM LASER/STENT PLACEMENT;  Surgeon: Elisabeth Valli BIRCH, MD;  Location: [facility];  Service: Urology;  Laterality: Left;  CYSTOSCOPY/LEFT URETEROSCOPY/HOLMIUM LASER/STENT PLACEMENT/RETROGRADE PYELOGRAM   EYE SURGERY     FRACTURE SURGERY  08/23/2019   Rt ankle   IR CATHETER TUBE CHANGE  02/02/2021   IR CATHETER TUBE CHANGE  02/25/2021   IR IMAGING GUIDED PORT INSERTION  06/20/2022   IR RADIOLOGIST EVAL & MGMT  02/24/2021   IR RADIOLOGIST EVAL & MGMT  03/10/2021   IR RADIOLOGIST EVAL & MGMT  06/22/2022   IR THROMBECT VENO MECH MOD SED  06/20/2022   IR US  GUIDE BX ASP/DRAIN  11/25/2019   IR US  GUIDE VASC ACCESS LEFT  06/20/2022   IR US  GUIDE VASC ACCESS RIGHT  06/20/2022   IR US  GUIDE VASC ACCESS RIGHT  06/20/2022   IR VENO/EXT/BI  06/20/2022   IR VENOCAVAGRAM IVC  06/20/2022   JOINT REPLACEMENT Bilateral    knees   KNEE ARTHROSCOPY     ORIF ANKLE FRACTURE Right 08/27/2019   Procedure: OPEN REDUCTION INTERNAL FIXATION RIGHT ANKLE FRACTURE;  Surgeon: Beverley Evalene BIRCH, MD;  Location: [facility];  Service: Orthopedics;  Laterality: Right;   perineorrophy     SMALL INTESTINE SURGERY     03/2004, 12/27/2003 & 12/10/2020   SPINAL FUSION     TARSAL TUNNEL RELEASE Right    TONGUE BIOPSY     TOTAL ABDOMINAL HYSTERECTOMY Bilateral 2004   with BSO and appendectomy    Social History:  reports that she has never smoked. She has never used smokeless tobacco. She reports that she does not drink alcohol and does not use drugs. Family History: family history includes Alcohol abuse in her brother, sister, sister, sister, and sister; Anxiety disorder in her mother; Arthritis in her brother, brother, maternal grandmother, mother, sister, sister, sister, and sister; Asthma in her father, son, and son; COPD in her father; Cancer in her father; Depression in her brother, brother, mother, sister, sister, sister, and sister; Diabetes in her paternal  grandmother, sister, and sister; Drug abuse in her brother, brother, brother, maternal aunt, maternal aunt, and sister; Early death in her father, maternal aunt, maternal aunt, maternal aunt, maternal aunt, paternal grandfather, sister, and sister; Heart disease in her mother; Hyperlipidemia in her mother; Hypertension in her mother; Miscarriages / Stillbirths in her maternal aunt, maternal aunt, and mother; Obesity in her maternal grandmother, mother, sister, and sister; Suicidality in her sister; Varicose Veins in her mother.   HOME MEDICATIONS: Allergies as of 10/29/2024       Reactions   Ativan [lorazepam] Swelling, Other (See Comments)   Face & Throat Swelling  Note: tolerates midazolam  fine   Corticosteroids Other (See Comments)   Psychotic behavior   Penicillins Shortness Of Breath, Other (See Comments)   Irregular and rapid Heart Rate, too   Alprazolam Hives, Other (See Comments)   Hard to arouse, unresponsiveness also   Erythromycin Nausea And Vomiting      Gabapentin  Other (See Comments)   Made the patient feel depressed   Prednisolone Anxiety   Prednisone Anxiety, Other (See Comments)   Anxiety & Nervous Breakdown   Savella  [milnacipran] Other (See Comments)   Reaction not noted        Medication List        Accurate as of October 29, 2024 12:04 PM. If you have any questions, ask your nurse or doctor.          acetaminophen  500 MG tablet Commonly known as: TYLENOL  Take 1,000 mg by mouth every 6 (six) hours as needed for moderate pain (pain score 4-6).   amLODipine  5 MG tablet Commonly known as: NORVASC  TAKE 1 TABLET(5 MG) BY MOUTH DAILY   busPIRone  15 MG tablet Commonly known as: BUSPAR  Take 1 tablet (15 mg total) by mouth 3 (three) times daily.   citalopram  40 MG tablet Commonly known as: CELEXA  TAKE 1 TABLET(40 MG) BY MOUTH DAILY   cyclobenzaprine  10 MG tablet Commonly known as: FLEXERIL  TAKE 1/2 TO 1 TABLET(5 TO 10 MG) BY MOUTH THREE TIMES DAILY  AS NEEDED FOR MUSCLE SPASMS. START EVERY NIGHT AT BEDTIME AS NEEDED DUE TO SEDATION   Dexcom G7 Sensor Misc 1 Device by Does not apply route as directed.   diazepam  10 MG tablet Commonly known as: VALIUM  Take  one pill as needed for procedures including MRI   diphenoxylate -atropine  2.5-0.025 MG tablet Commonly known as: LOMOTIL  TAKE 2 TABLETS BY MOUTH FOUR TIMES DAILY AS NEEDED FOR LOOSE STOOLS OR DIARRHEA   estradiol 0.01 % Crea vaginal cream Commonly known as: ESTRACE Place 1 Applicatorful vaginally every 14 (fourteen) days.   eszopiclone  2 MG Tabs tablet Commonly known as: LUNESTA  TAKE 1 TABLET(2 MG) BY MOUTH AT BEDTIME AS NEEDED FOR SLEEP   famotidine  20 MG tablet Commonly known as: PEPCID  TAKE 1 TABLET(20 MG) BY MOUTH TWICE DAILY   fondaparinux  10 MG/0.8ML Soln injection Commonly known as: Arixtra  Inject 0.8 mLs (10 mg total) into the skin daily.   fondaparinux  10 MG/0.8ML Soln injection Commonly known as: ARIXTRA  Inject 0.8 mLs (10 mg total) into the skin daily.   hydrocortisone  2.5 % rectal cream Commonly known as: ANUSOL -HC PLACE RECTALLY 4 TIMES DAILY AS NEEDED FOR HEMORRHOIDS OR ANAL ITCHING   HYDROmorphone  4 MG tablet Commonly known as: Dilaudid  Take 1 tablet (4 mg total) by mouth every 6 (six) hours as needed for severe pain (pain score 7-10).   Insulin  Pen Needle 32G X 4 MM Misc 1 Device by Does not apply route in the morning, at noon, in the evening, and at bedtime.   Embecta Pen Needle Nano 2 Gen 32G X 4 MM Misc Generic drug: Insulin  Pen Needle Use to check blood glucose  times a day.   Lantus  SoloStar 100 UNIT/ML Solostar Pen Generic drug: insulin  glargine Inject 40 Units into the skin daily.   lidocaine -hydrocortisone  3-1 % Kit Commonly known as: ANAMANTLE Place 1 Application rectally 2 (two) times daily.   loperamide  2 MG capsule Commonly known as: IMODIUM  Take 2 capsules (4 mg total) by mouth 4 (four) times daily as needed for diarrhea or  loose stools.   meclizine  25 MG tablet Commonly known as: ANTIVERT  TAKE 1 TABLET(25 MG) BY MOUTH THREE TIMES DAILY AS NEEDED FOR DIZZINESS   mirtazapine  15 MG tablet Commonly known as: REMERON  Take 1 tablet (15 mg total) by mouth at bedtime.   NovoLOG  FlexPen 100 UNIT/ML FlexPen Generic drug: insulin  aspart Max daily 60 units   ondansetron  8 MG tablet Commonly known as: ZOFRAN  TAKE 1 TABLET(8 MG) BY MOUTH EVERY 8 HOURS AS NEEDED FOR NAUSEA OR VOMITING   orphenadrine  100 MG tablet Commonly known as: NORFLEX  TAKE 1 TABLET(100 MG) BY MOUTH AT BEDTIME AS NEEDED FOR MUSCLE SPASMS   Ostomy Supplies Misc 20 each by Other route.   pantoprazole  40 MG tablet Commonly known as: PROTONIX  Take 1 tablet (40 mg total) by mouth daily.   pramoxine-hydrocortisone  1-1 % rectal cream Commonly known as: PROCTOCREAM-HC APPLY RECTALLY TO THE AFFECTED AREA TWICE DAILY   promethazine  12.5 MG tablet Commonly known as: PHENERGAN  TAKE 1 TABLET(12.5 MG) BY MOUTH EVERY 6 HOURS AS NEEDED FOR NAUSEA OR VOMITING   propranolol  20 MG tablet Commonly known as: INDERAL  Take 1 tablet (20 mg total) by mouth 2 (two) times daily.   terconazole  0.4 % vaginal cream Commonly known as: TERAZOL 7  Apply topically twice daily for up to 7 days          REVIEW OF SYSTEMS: A comprehensive ROS was conducted with the patient and is negative except as per HPI     OBJECTIVE:  VS:BP (!) 138/100   Ht 5' 6 (1.676 m)   Wt 230 lb (104.3 kg)   BMI 37.12 kg/m      Wt Readings from Last 3 Encounters:  10/29/24 230 lb (104.3 kg)  10/04/24 231 lb (104.8 kg)  09/30/24 229 lb 6.4 oz (104.1 kg)     EXAM: General: Pt appears well and is in NAD  Neck: General: Supple without adenopathy. Thyroid : Thyroid  size normal.  No goiter or nodules appreciated.  Lungs: Clear with good BS bilat   Heart: Auscultation: RRR.  Abdomen: Soft, nontender  Extremities:  BL LE: No pretibial edema   Mental Status: Judgment,  insight: Intact Orientation: Oriented to time, place, and person Mood and affect: No depression, anxiety, or agitation     DATA REVIEWED:   Latest Reference Range & Units 07/04/24 14:50  Sodium 135 - 145 mmol/L 140  Potassium 3.5 - 5.1 mmol/L 3.9  Chloride 98 - 111 mmol/L 102  CO2 22 - 32 mmol/L 26  Glucose 70 - 99 mg/dL 587 (H)  BUN 8 - 23 mg/dL 14  Creatinine 9.55 - 8.99 mg/dL 9.10  Calcium  8.9 - 10.3 mg/dL 8.9  Anion gap 5 - 15  12  Magnesium  1.7 - 2.4 mg/dL 1.6 (L)  Alkaline Phosphatase 38 - 126 U/L 249 (H)  Albumin 3.5 - 5.0 g/dL 4.0  AST 15 - 41 U/L 68 (H)  ALT 0 - 44 U/L 76 (H)  Total Protein 6.5 - 8.1 g/dL 7.1  Total Bilirubin 0.0 - 1.2 mg/dL 0.4  GFR, Est Non African American >60 mL/min >60     Latest Reference Range & Units 04/17/23 12:17  Insulin -Like GF-1 60 - 207 ng/mL 92  FSH 25.8 - 134.8 mIU/mL 21.8 (L)  Prolactin 3.6 - 25.2 ng/mL 49.9 (H)  Glucose 70 - 99 mg/dL 859 (H)     MRI brain 5/81/7974   Brain: No abnormality affects the brainstem. There is continued enlargement of a meningioma associated with the medial aspect of the left tentorial leaflet with a broad surface along the medial left transverse sinus and the torcula. Maximal dimension in the sagittal plane today measures 22 x 19 mm compared with 22 x 15 mm in August of 2024 and 21 x 13 mm in September of 2023.   Cerebral hemispheres show a very few punctate foci of T2 and FLAIR signal in the white matter, not likely significant. There is not a pattern of widespread or significant small-vessel disease. No cortical or large vessel territory stroke. No hydrocephalus. No extra-axial fluid collection.   Within the central to left side of the pituitary gland, there is a macro adenoma measuring today 15 x 12 x 10 mm. When measured using the same technique, this is the same size. Small nonenhancing cystic component inferiorly again noted. No definite cavernous sinus invasion.   Vascular: Major  vessels at the base of the brain show flow.   Skull and upper cervical spine: Negative   Sinuses/Orbits: Clear/normal   Other: None   IMPRESSION: 1. Continued enlargement of a meningioma associated with the medial aspect of the left tentorial leaflet with a broad surface along the medial left transverse sinus and the torcula. Maximal dimension in the sagittal plane today measures 22 x 19 mm compared with 22 x 15 mm in August of 2024 and 21 x 13 mm in September of 2023. 2. Stable pituitary macro adenoma measuring 15 x 12 x 10 mm.       Thyroid  Ultrasound 02/07/2024 Estimated total number of nodules >/= 1 cm: 1   Number of spongiform nodules >/=  2 cm not described below (TR1): 0   Number of mixed cystic and solid nodules >/=  1.5 cm not described below (TR2): 0   _________________________________________________________   Nodule # 1: Changing morphology and appearance of the nodule in the right lower gland. On today's exam, the nodule is more solid and hypoechoic and demonstrates taller than wide morphology in the transverse view. At 1.6 x 1.1 x 1.4 cm, this TI-RADS category 4 nodule now meets criteria for biopsy. **Given size (>/= 1.5 cm) and appearance, fine needle aspiration of this moderately suspicious nodule should be considered based on TI-RADS criteria.   IMPRESSION: Changing morphology of the nodule in the right inferior gland which is now considered TI-RADS category 4 and meets criteria for biopsy. Biopsy is recommended.  FNA right inferior nodule 02/27/2024   Clinical History: Changing morphology and appearance of the nodule in  the right lower gland.  On todays exam, the nodule is more solid and  hypoechoic and demonstrates taller than wide morphology in the  transverse view.  At 1.6 x 1.1 x 1.4cm TI-RADS - 4  Specimen Submitted:  A. THYROID , RIGHT LOWER, FINE NEEDL0E ASPIRATION:    FINAL MICROSCOPIC DIAGNOSIS:  - Benign follicular nodule ([place]  category II)    In office BG 304 mg/dL     ASSESSMENT/PLAN/RECOMMENDATIONS:   1) Type 2 Diabetes Mellitus, Sub-optimally controlled, With neuropathic  complications - Most recent A1c of 7.8 %. Goal A1c < 7.0 %.    -A1c has improved from 10.0% to 7.8% - Not a candidate for GLP-1 agonist due to chronic diarrhea and risk of GI side effects -We discontinued glipizide  due to persistent hyperglycemia -I will restart her on a small dose of prandial dose of insulin  in addition to using correction scale as needed -She has been noted with hypoglycemia overnight, I will decrease her basal insulin  as below - We did entertain the idea of insulin  pump technology, I did show the patient the OmniPod, we will wait until her abdominal fistula has healed and she has recovered   MEDICATIONS:  Decrease Lantus  26 units daily Start NovoLog  4 units with each meal Continue CF: NovoLog  (BG -130/25) TIDQAC  EDUCATION / INSTRUCTIONS: BG monitoring instructions: Patient is instructed to check her blood sugars 3 times a day. Call [facility] if: BG persistently < 70  I reviewed the Rule of 15 for the treatment of hypoglycemia in detail with the patient. Literature supplied.   2.Pituitary Macroadenoma:    -Pituitary MRI showed 11 mm pituitary adenoma, no invasion or mass effect on adjacent structures - Repeat MRI showed stable pituitary adenoma ,but Increase size of meningioma, neurosurgery didn't recommend sx as the risk outweighs the risk .  She did undergo radiation therapy in October, 2025 - Historically FSH is inappropriately normal, IGF- I  normal       3. Elevated ACTH :   -This has been slightly elevated with normal 24-hour urinary cortisol in 2024 and 2025 -Differential diagnosis includes paradoxical effect from somatostatin intake, ectopic ACTH  secretion   -Will continue to monitor   4.  Right thyroid  nodule:   -No local symptoms -TFTs are normal - S/p benign FNA  02/2024    F/U in 4 months     Signed electronically by: Stefano Redgie Butts, MD  [facility]  [facility] [address] [place] [place], [place] [address] Phone: [phone] FAX: [phone]   CC: [name], MD [id number] [place] [address] Phone: [phone] Fax: [phone]   Return to Endocrinology clinic as below: Future Appointments  Date Time Provider Department Center  10/29/2024 12:10 PM Somaya Grassi, Donell Cardinal, MD [facility] None  10/30/2024 11:00 AM Teresa Rogue A, NP [facility] None  11/15/2024 12:00 PM [facility] LAB [facility] None  11/15/2024 12:15 PM [facility] INJ NURSE [facility] None  11/15/2024 12:30 PM Ennever, Maude SAUNDERS, MD [facility] None  12/03/2024 11:50 AM [facility] MR 3 [id number] [facility] [address]  12/09/2024  7:00 AM [facility]FERENCE [facility] None  12/09/2024  3:30 PM Maritza Stagger, MD [facility] None  12/27/2024 12:00 PM [facility] LAB [facility] None  12/27/2024 12:15 PM [facility] INJ NURSE [facility] None  12/27/2024 12:30 PM Ennever, Maude SAUNDERS, MD [facility] None  03/31/2025  1:00 PM [name], MD [facility] [place]  07/08/2025 12:45 PM Valdemar Rogue, MD [facility] None         "

## 2024-10-29 NOTE — Patient Instructions (Addendum)
 Decrease Lantus  26 units daily Start NovoLog  4 units with each meal Novolog  correctional insulin : ADD extra units on insulin  to your meal-time Novolog  dose if your blood sugars are higher than 155. Use the scale below to help guide you:   Blood sugar before meal Number of units to inject  Less than 155 0 unit  156 -  180 1 units  181 -  205 2 units  206 -  230 3 units  231 -  255 4 units  256 -  280 5 units  281 -  305 6 units  306 -  330 7 units  331 -  355 8 units  356 - 380 9 units  381-  405 10 units       HOW TO TREAT LOW BLOOD SUGARS (Blood sugar LESS THAN 70 MG/DL) Please follow the RULE OF 15 for the treatment of hypoglycemia treatment (when your (blood sugars are less than 70 mg/dL)   STEP 1: Take 15 grams of carbohydrates when your blood sugar is low, which includes:  3-4 GLUCOSE TABS  OR 3-4 OZ OF JUICE OR REGULAR SODA OR ONE TUBE OF GLUCOSE GEL    STEP 2: RECHECK blood sugar in 15 MINUTES STEP 3: If your blood sugar is still low at the 15 minute recheck --> then, go back to STEP 1 and treat AGAIN with another 15 grams of carbohydrates.

## 2024-10-30 ENCOUNTER — Ambulatory Visit (INDEPENDENT_AMBULATORY_CARE_PROVIDER_SITE_OTHER): Admitting: Behavioral Health

## 2024-10-30 ENCOUNTER — Ambulatory Visit: Admitting: Behavioral Health

## 2024-10-30 ENCOUNTER — Encounter: Payer: Self-pay | Admitting: Behavioral Health

## 2024-10-30 ENCOUNTER — Other Ambulatory Visit (HOSPITAL_COMMUNITY): Payer: Self-pay

## 2024-10-30 DIAGNOSIS — F411 Generalized anxiety disorder: Secondary | ICD-10-CM | POA: Diagnosis not present

## 2024-10-30 DIAGNOSIS — F99 Mental disorder, not otherwise specified: Secondary | ICD-10-CM | POA: Diagnosis not present

## 2024-10-30 DIAGNOSIS — R69 Illness, unspecified: Secondary | ICD-10-CM

## 2024-10-30 DIAGNOSIS — F331 Major depressive disorder, recurrent, moderate: Secondary | ICD-10-CM | POA: Diagnosis not present

## 2024-10-30 DIAGNOSIS — F5105 Insomnia due to other mental disorder: Secondary | ICD-10-CM | POA: Diagnosis not present

## 2024-10-30 MED ORDER — BUSPIRONE HCL 30 MG PO TABS: 30.0000 mg | ORAL_TABLET | Freq: Two times a day (BID) | ORAL | 1 refills | Status: AC

## 2024-10-30 NOTE — Progress Notes (Signed)
 "     [facility]  Patient ID: Wendy Barber,  MRN: [id number]  PCP: [name], MD  Date of Evaluation: 10/30/2024 Time spent:30 minutes  Chief Complaint:  Chief Complaint   Anxiety; Medication Refill; Follow-up; Patient Education; Stress     HISTORY/CURRENT STATUS: HPI 64 year old female presents to this office face to face  for follow up and medication management.  Patient appears short of breath with activity.  She is limited due to chronic health issues.  She appears distressed today.  Reports being in and out of the hospital multiple times during the month of November for the development of multiple abdominal fistulous.  Says that she is very discouraged due to the lack of improvement.  This has fueled her depression and anxiety.  Also suffers from chronic pain.  She follows up with PCP and specialist regularly.  She is doing her best to maintain some element of quality of life.  She is open to trying a different class of medications to help with her current depressive symptoms.  Reports her anxiety today at 5/10 and depression at 6/10. She is sleeping 6-7 hours per night. Her family remains highly supportive. She denies any mania, no psychosis, No current SI or HI.       Past Psychiatric Medication Trials: Cymbalta , Zoloft, Prozac  Citalopram , Wellbutrin , Amitriptyline, Seroquel, Trazodone , Ambien , Lunesta , Buspar , Propranolol , Gabapentin ,   Allergies: Ativan [lorazepam], Corticosteroids, Penicillins, Alprazolam, Erythromycin, Gabapentin , Prednisolone, Prednisone, and Savella  [milnacipran]      Individual Medical History/ Review of Systems: Changes? :No   Allergies: Ativan [lorazepam], Corticosteroids, Penicillins, Alprazolam, Erythromycin, Gabapentin , Prednisolone, Prednisone, and Savella  [milnacipran]  Current Medications: Current Medications[1] Medication Side Effects: none  Family Medical/ Social History: Changes? No  MENTAL HEALTH EXAM:  There were  no vitals taken for this visit.There is no height or weight on file to calculate BMI.  General Appearance: Casual and Neat  Eye Contact:  Good  Speech:  Clear and Coherent  Volume:  Normal  Mood:  Anxious and Depressed  Affect:  Depressed and Anxious  Thought Process:  Coherent  Orientation:  Full (Time, Place, and Person)  Thought Content: Logical   Suicidal Thoughts:  No  Homicidal Thoughts:  No  Memory:  WNL  Judgement:  Good  Insight:  Good  Psychomotor Activity:  Normal  Concentration:  Concentration: Good  Recall:  Good  Fund of Knowledge: Good  Language: Good  Assets:  Desire for Improvement  ADL's:  Intact  Cognition: WNL  Prognosis:  Good    DIAGNOSES:    ICD-10-CM   1. Major depressive disorder, recurrent episode, moderate (HCC)  F33.1 busPIRone  (BUSPAR ) 30 MG tablet    2. Generalized anxiety disorder  F41.1 busPIRone  (BUSPAR ) 30 MG tablet    3. Insomnia due to other mental disorder  F51.05 busPIRone  (BUSPAR ) 30 MG tablet   F99     4. Chronic illness  R69 busPIRone  (BUSPAR ) 30 MG tablet      Receiving Psychotherapy: No    RECOMMENDATIONS:   Greater than 50% of 30 min face to face with patient was spent on counseling and coordination of care. Discussed recent decline with health again.  She is very discouraged.  Has been in and out of the hospital multiple times during the month of November for repeated fistulas.  She looks very tired and distressed today.  She gets SOB with extended ambulation.  She stays in chronic pain.  She has to be very  careful and starting any new medication due to risk of causing further problems or contraindication.  We agreed today that a trial of Rexulti may be justified to help with increased depression and anxiety.  Rexulti is FDA approved for adjunctive therapy for depression.  She has significant past failures with antidepressant medication.    We agreed to:  Will start Rexulti 0.5 mg for 1 week, then 1 mg daily until next  visit, recommended taking this medication at bedtime.  2 weeks samples provided.  Patient instructed to notify this office if tolerated and seeing improvement before sending in Rx to pharmacy. To increase Buspar   to 30  mg to twice daily for anxiety.  Continue Celexa  to 40 mg daily. Will take with food to help with nausea.   To Continue Lunesta  5 mg at betime. Belsomra  was not approved by insurance. She is happy with Lunesta  but says causes metallic taste in mouth next day.  To continue mirtazapine  to 15 mg daily at bedtime for sleep.   Continue Propranolol  20 mg twice daily Will report worsening symptoms or side effects promptly To follow up in 4 weeks to reassess Will continue to follow up with PCP regularly Provided emergency contact information Reviewed PDMP         Redell DELENA Pizza, NP     [1]  Current Outpatient Medications:    busPIRone  (BUSPAR ) 30 MG tablet, Take 1 tablet (30 mg total) by mouth 2 (two) times daily., Disp: 60 tablet, Rfl: 1   acetaminophen  (TYLENOL ) 500 MG tablet, Take 1,000 mg by mouth every 6 (six) hours as needed for moderate pain (pain score 4-6)., Disp: , Rfl:    amLODipine  (NORVASC ) 5 MG tablet, TAKE 1 TABLET(5 MG) BY MOUTH DAILY, Disp: 90 tablet, Rfl: 1   busPIRone  (BUSPAR ) 15 MG tablet, Take 1 tablet (15 mg total) by mouth 3 (three) times daily., Disp: 270 tablet, Rfl: 0   citalopram  (CELEXA ) 40 MG tablet, TAKE 1 TABLET(40 MG) BY MOUTH DAILY, Disp: 30 tablet, Rfl: 0   Continuous Glucose Sensor (DEXCOM G7 SENSOR) MISC, 1 Device by Does not apply route as directed., Disp: 9 each, Rfl: 3   cyclobenzaprine  (FLEXERIL ) 10 MG tablet, TAKE 1/2 TO 1 TABLET(5 TO 10 MG) BY MOUTH THREE TIMES DAILY AS NEEDED FOR MUSCLE SPASMS. START EVERY NIGHT AT BEDTIME AS NEEDED DUE TO SEDATION, Disp: 30 tablet, Rfl: 0   diazepam  (VALIUM ) 10 MG tablet, Take one pill as needed for procedures including MRI, Disp: 3 tablet, Rfl: 0   diphenoxylate -atropine  (LOMOTIL ) 2.5-0.025 MG tablet,  TAKE 2 TABLETS BY MOUTH FOUR TIMES DAILY AS NEEDED FOR LOOSE STOOLS OR DIARRHEA, Disp: 240 tablet, Rfl: 2   estradiol (ESTRACE) 0.01 % CREA vaginal cream, Place 1 Applicatorful vaginally every 14 (fourteen) days., Disp: , Rfl:    eszopiclone  (LUNESTA ) 2 MG TABS tablet, TAKE 1 TABLET(2 MG) BY MOUTH AT BEDTIME AS NEEDED FOR SLEEP, Disp: 30 tablet, Rfl: 3   famotidine  (PEPCID ) 20 MG tablet, TAKE 1 TABLET(20 MG) BY MOUTH TWICE DAILY, Disp: 180 tablet, Rfl: 1   fondaparinux  (ARIXTRA ) 10 MG/0.8ML SOLN injection, Inject 0.8 mLs (10 mg total) into the skin daily., Disp: 72 mL, Rfl: 3   fondaparinux  (ARIXTRA ) 10 MG/0.8ML SOLN injection, Inject 0.8 mLs (10 mg total) into the skin daily., Disp: 72 mL, Rfl: 3   hydrocortisone  (ANUSOL -HC) 2.5 % rectal cream, PLACE RECTALLY 4 TIMES DAILY AS NEEDED FOR HEMORRHOIDS OR ANAL ITCHING, Disp: 30 g, Rfl: 1   HYDROmorphone  (  DILAUDID ) 4 MG tablet, Take 1 tablet (4 mg total) by mouth every 6 (six) hours as needed for severe pain (pain score 7-10)., Disp: 60 tablet, Rfl: 0   insulin  aspart (NOVOLOG  FLEXPEN) 100 UNIT/ML FlexPen, Max daily 60 units, Disp: 60 mL, Rfl: 3   insulin  glargine (LANTUS  SOLOSTAR) 100 UNIT/ML Solostar Pen, Inject 40 Units into the skin daily., Disp: 45 mL, Rfl: 4   Insulin  Pen Needle (EMBECTA PEN NEEDLE NANO 2 GEN) 32G X 4 MM MISC, Use to check blood glucose  times a day., Disp: 300 each, Rfl: 1   Insulin  Pen Needle 32G X 4 MM MISC, 1 Device by Does not apply route in the morning, at noon, in the evening, and at bedtime., Disp: 400 each, Rfl: 3   lidocaine -hydrocortisone  (ANAMANTLE) 3-1 % KIT, Place 1 Application rectally 2 (two) times daily., Disp: 1 kit, Rfl: 0   loperamide  (IMODIUM ) 2 MG capsule, Take 2 capsules (4 mg total) by mouth 4 (four) times daily as needed for diarrhea or loose stools., Disp: , Rfl:    meclizine  (ANTIVERT ) 25 MG tablet, TAKE 1 TABLET(25 MG) BY MOUTH THREE TIMES DAILY AS NEEDED FOR DIZZINESS, Disp: 60 tablet, Rfl: 0    mirtazapine  (REMERON ) 15 MG tablet, Take 1 tablet (15 mg total) by mouth at bedtime., Disp: 90 tablet, Rfl: 1   ondansetron  (ZOFRAN ) 8 MG tablet, TAKE 1 TABLET(8 MG) BY MOUTH EVERY 8 HOURS AS NEEDED FOR NAUSEA OR VOMITING, Disp: 30 tablet, Rfl: 2   orphenadrine  (NORFLEX ) 100 MG tablet, TAKE 1 TABLET(100 MG) BY MOUTH AT BEDTIME AS NEEDED FOR MUSCLE SPASMS, Disp: 30 tablet, Rfl: 2   Ostomy Supplies MISC, 20 each by Other route., Disp: , Rfl:    pantoprazole  (PROTONIX ) 40 MG tablet, Take 1 tablet (40 mg total) by mouth daily., Disp: 90 tablet, Rfl: 1   pramoxine-hydrocortisone  (PROCTOCREAM-HC) 1-1 % rectal cream, APPLY RECTALLY TO THE AFFECTED AREA TWICE DAILY, Disp: 30 g, Rfl: 3   promethazine  (PHENERGAN ) 12.5 MG tablet, TAKE 1 TABLET(12.5 MG) BY MOUTH EVERY 6 HOURS AS NEEDED FOR NAUSEA OR VOMITING, Disp: 90 tablet, Rfl: 3   propranolol  (INDERAL ) 20 MG tablet, Take 1 tablet (20 mg total) by mouth 2 (two) times daily., Disp: 180 tablet, Rfl: 1   terconazole  (TERAZOL 7 ) 0.4 % vaginal cream, Apply topically twice daily for up to 7 days, Disp: 45 g, Rfl: 2  "

## 2024-11-04 ENCOUNTER — Ambulatory Visit: Payer: Self-pay | Admitting: *Deleted

## 2024-11-04 ENCOUNTER — Telehealth: Payer: Self-pay | Admitting: Family Medicine

## 2024-11-04 ENCOUNTER — Telehealth: Payer: Self-pay | Admitting: Behavioral Health

## 2024-11-04 ENCOUNTER — Telehealth: Payer: Self-pay | Admitting: *Deleted

## 2024-11-04 NOTE — Telephone Encounter (Signed)
 Placed in sign folder POD B

## 2024-11-04 NOTE — Telephone Encounter (Signed)
 Pt was put on new dosage of Rexulti. Had a syncope episode this weekend where she passed out. PCP wanted her to go to ER then she realized she was on mew meds. Wants to speak with Redell about SE

## 2024-11-04 NOTE — Telephone Encounter (Signed)
 Paperwork completed and placed in fax bin at back nurse station

## 2024-11-04 NOTE — Telephone Encounter (Signed)
 Noted, please call and check on status of patient today.  If she is still feeling lightheaded or dizzy with movement, should be evaluated.  If any further syncopal episodes should be seen through ER.

## 2024-11-04 NOTE — Telephone Encounter (Signed)
See other message, duplicate encounter

## 2024-11-04 NOTE — Telephone Encounter (Unsigned)
 Copied from CRM #[id number]. Topic: Clinical - Medication Question >> Nov 04, 2024 12:40 PM Wendy Barber wrote: Reason for CRM: Wendy Barber from [facility] Stated that the patient told her that she started a new script rexoti and that has the side effects of the low blood pressure, dizziness and light headedness as well. She stated that she also contact the doctor that prescribed to her.

## 2024-11-04 NOTE — Telephone Encounter (Signed)
 [facility] triage nurse called to report that this pt fainted yesterday was advised to go to ED but refused. She did not want to go around sick people.

## 2024-11-04 NOTE — Telephone Encounter (Signed)
 Type of form received: [facility]  Additional comments:   Received by: Fax  Form should be Faxed/mailed to: (address/ fax #) [phone]  Is patient requesting call for pickup:  Form placed:  Provider bin  Attach charge sheet.  Provider will determine charge.  Individual made aware of 3-5 business day turn around Yes?

## 2024-11-04 NOTE — Telephone Encounter (Signed)
 Wendy Barber with [facility] stated pt got up from her chair yesterday and had a syncope. Pt is orthostatic going from sitting to standing, sitting 140/98 standing 112/90. Heart rate remained stable. Pt is drinking water.   ED was recommended.

## 2024-11-04 NOTE — Telephone Encounter (Signed)
 Received a call from [facility] with [facility] nursing about patient having a syncopal episode and dizziness.  BP 140/90 sitting.  112/90 standing.  HR 65.  Was recommended by the physician to go to the ED for evaluation.  When I called the nurse back, patient had told her she started Rexulti and the side effects were dizziness.  Wendy Barber has a call in to the physician who prescribed it.  Wendy Barber appreciated the call.

## 2024-11-04 NOTE — Telephone Encounter (Signed)
 Recommended ED for evaluation. [facility] nurse verbalized understanding. Patient does not want to go to ED around others if possible. Please advise.  CAL , Jamee, notified.     FYI Only or Action Required?: FYI only for provider: ED advised.  Patient was last seen in primary care on 09/30/2024 by [name], MD.  Called Nurse Triage reporting Dizziness.  Symptoms began yesterday.  Interventions attempted: Rest, hydration, or home remedies.  Symptoms are: unchanged.  Triage Disposition: Go to ED Now (Notify PCP)  Patient/caregiver understands and will follow disposition?: Unsure       Copied from CRM #[id number]. Topic: Clinical - Red Word Triage >> Nov 04, 2024 12:02 PM Rea ORN wrote: Red Word that prompted transfer to Nurse Triage: Wendy Barber with [facility] stated pt got up from her chair yesterday and had a syncope. Pt is orthostatic going from sitting to standing, sitting 140/98 standing 112/90. Heart rate remained stable. Pt is drinking water. Reason for Disposition  [1] Fainted > 15 minutes ago AND [2] still feels weak or dizzy  Answer Assessment - Initial Assessment Questions Recommended ED for evaluation. Patient does not want to go around others due to flu. Please advise.      1. ONSET: How long were you unconscious? (e.g., minutes, seconds) When did it happen?     10-15 seconds yesterday per [facility] nurse from [place] per patient's husband  2. CONTENT: What happened during the period of unconsciousness? (e.g., seizure activity)      No report of seizure yesterday . Patient's daughter and husband assisted patient in to bed and patient alert with 10-15 seconds 3. MENTAL STATUS: Alert and oriented now? (e.g., oriented x 3 = name, month, location)      Alert now able to answer questions appropriately  4. TRIGGER: What do you think caused the fainting? What were you doing just before you fainted?  (e.g., exercise, sudden standing up,  prolonged standing)     Hypotension not sure. Standing, bleeding fistula yesterday. 5. RECURRENT SYMPTOM: Have you ever passed out before? If Yes, ask: When was the last time? and What happened that time?      Yes while ago  6. INJURY: Did you hurt yourself when you fell?      na 7. CARDIAC SYMPTOMS: Have you had any of the following symptoms: chest pain, difficulty breathing, palpitations?     Denies chest pain no difficulty breathing no heart issues reported.  8. NEUROLOGIC SYMPTOMS: Have you had any of the following symptoms: headache, numbness, vertigo, weakness?    Feels weak but able to stand 9. GI SYMPTOMS: Have you had any of the following symptoms: abdomen pain, vomiting, diarrhea, blood in stools?     Bleeding from fistula yesterday but bleeding stopped 10. OTHER SYMPTOMS: Do you have any other symptoms?       Stumbling getting out of bed this am. BP 140/98 sitting , 112/90 standing. HR stable per [facility] nurse. Patient is drinking water. 11. PREGNANCY: Is there any chance you are pregnant? When was your last menstrual period?       na  Protocols used: Fainting-A-AH

## 2024-11-05 ENCOUNTER — Telehealth: Payer: Self-pay | Admitting: Family Medicine

## 2024-11-05 NOTE — Telephone Encounter (Signed)
 I agree with the plan you advise.  Rexulti has an almost negligible risk of syncope.  It does not have any sig risk of orthostasis.  I agree with plan to continue current tx plan as a less risky plan than alternatives.

## 2024-11-05 NOTE — Telephone Encounter (Addendum)
 Placed in POD B sign folder, office information filled in did not find previously filled out one so I did leave the reason for placard blank for now

## 2024-11-05 NOTE — Telephone Encounter (Signed)
 Pt started Rexulti 0.5 mg on Wednesday and reported a syncopal episode this weekend. She only had one episode. Does report some dizziness. She takes at bedtime. She has home health and mentioned to her nurse. Nurse read the information sheet and reported sx could be from Rexulti. She said she has continued to take it, said you told her not to stop it. She said today she will start 1 mg. Does not report any benefit yet, though it is early.

## 2024-11-05 NOTE — Telephone Encounter (Signed)
 Noted. Thanks for the update.

## 2024-11-05 NOTE — Telephone Encounter (Signed)
 Called patient and reviewed orthostatic precautions with her and also Dr. Calhoun opinion about Rexulti.

## 2024-11-05 NOTE — Telephone Encounter (Signed)
 Called patient and checked on her this morning. Patient started a new medication last week, rexulti - small dose and tapering up. Patient and a nurse think her dizziness and lightheadedness is from that medication. Patient called provider who prescribed medication and waiting for a call back. She said that she is feeling much better. If anything changes she will let us  know. She is not sure if they will take her off the medication or keep her on it.

## 2024-11-05 NOTE — Telephone Encounter (Signed)
 Type of form received: Disability Parking Placard  Additional comments:   Received by: Self  Form should be Faxed/mailed to: (address/ fax #)   Is patient requesting call for pickup: [phone]  Form placed:  Providers bin  Attach charge sheet.  Provider will determine charge.  Individual made aware of 3-5 business day turn around Yes?

## 2024-11-05 NOTE — Telephone Encounter (Signed)
 Dr. Geoffry,  Could you please review this and advise. She has multitude of chronic medical issues but no relief from depression.    If she has had no other issues just continue the medication if one time event. Take 30 min before bed. If it happens again stop the medication and notify this office. Make sure that she is allowing her BP to adjust before standing or getting out of bed. Allow feet to dangle off side of bed for at least 30 seconds.  Too early for medication to work and not adequate dose yet.

## 2024-11-06 NOTE — Telephone Encounter (Signed)
 Paperwork completed and placed in fax bin at back nurse station

## 2024-11-07 ENCOUNTER — Other Ambulatory Visit: Payer: Self-pay

## 2024-11-07 DIAGNOSIS — C772 Secondary and unspecified malignant neoplasm of intra-abdominal lymph nodes: Secondary | ICD-10-CM

## 2024-11-07 MED ORDER — HYDROMORPHONE HCL 4 MG PO TABS: 4.0000 mg | ORAL_TABLET | Freq: Four times a day (QID) | ORAL | 0 refills | Status: AC | PRN

## 2024-11-07 NOTE — Telephone Encounter (Signed)
 Called and let patient know this is ready, notes she will pick this up on Monday or Tuesday

## 2024-11-08 ENCOUNTER — Encounter: Payer: Self-pay | Admitting: Family Medicine

## 2024-11-08 ENCOUNTER — Ambulatory Visit: Admitting: Family Medicine

## 2024-11-08 ENCOUNTER — Encounter: Payer: Self-pay | Admitting: Hematology & Oncology

## 2024-11-08 VITALS — BP 102/64 | HR 80 | Temp 98.4°F | Resp 22 | Ht 66.0 in | Wt 231.0 lb

## 2024-11-08 DIAGNOSIS — R350 Frequency of micturition: Secondary | ICD-10-CM

## 2024-11-08 DIAGNOSIS — R195 Other fecal abnormalities: Secondary | ICD-10-CM | POA: Diagnosis not present

## 2024-11-08 DIAGNOSIS — H9202 Otalgia, left ear: Secondary | ICD-10-CM

## 2024-11-08 DIAGNOSIS — K625 Hemorrhage of anus and rectum: Secondary | ICD-10-CM

## 2024-11-08 DIAGNOSIS — R109 Unspecified abdominal pain: Secondary | ICD-10-CM | POA: Diagnosis not present

## 2024-11-08 DIAGNOSIS — R509 Fever, unspecified: Secondary | ICD-10-CM | POA: Diagnosis not present

## 2024-11-08 DIAGNOSIS — K632 Fistula of intestine: Secondary | ICD-10-CM

## 2024-11-08 DIAGNOSIS — R739 Hyperglycemia, unspecified: Secondary | ICD-10-CM

## 2024-11-08 DIAGNOSIS — R319 Hematuria, unspecified: Secondary | ICD-10-CM

## 2024-11-08 LAB — POCT URINALYSIS DIPSTICK
Appearance: ABNORMAL
Bilirubin, UA: 1 — AB
Blood, UA: 200 — AB
Glucose, UA: NEGATIVE
Ketones, UA: NEGATIVE
Nitrite, UA: NEGATIVE
Odor: POSITIVE
Protein, UA: POSITIVE — AB
Spec Grav, UA: >=1.03 — AB
Urobilinogen, UA: 0.2 U/dL
pH, UA: 6

## 2024-11-08 NOTE — Progress Notes (Signed)
 "  Subjective:  Patient ID: Wendy Barber, female    DOB: 30-Jul-1961  Age: 64 y.o. MRN: [id number]  CC:  Chief Complaint  Patient presents with   Acute Visit    Past two nights patient has had ran a fever. Fistula are hot and painful. Possible UTI. Took nitro starting two days ago, 1 tablet twice a day. Left ear. Painful. Blood in stool for the last two days. Better this morning. Blood black and bright red clots.     HPI Wendy Barber presents for above - here with dtr [facility].   Fever Noted 2 nights ago - up to 101 Tmax. comes and goes. Some chills today, but no measured fever today. DTR checked temp today - around 98. Has felt shaking chills. Feels like she has a UTI - pain sensation into vagina. Tried taking nitrofurantion she had at home - 4 doses, twice per day. Some blood noted in urine - past 2 days - more clear today.   New blood in stool - BRBPR and clots in stool starting 2 nights ago. Hx of hemorrhoids. Diarrhea has improving with discharge into pouches. Some formed stool, no hard stools.  Sharp pain in left ear started 2 days ago.   New redness in abdomen past few days. Firm in area, warm, tender.   Has been taking insulin , decrease food intake past intake past 2 days. 1/2 fairlife yesterday. 224 currently on cgm - elevated above 200 past 2 days.   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  Seen in follow-up by surgical oncology on December 31.  Was evaluated at that time with new drainage from the midline ex lap incision, fever, and reddened area between the wounds at that time per history from general surgery note.   Previous abdominal wounds have been treated by wound manager, ostomy bags.  Superior dehiscence occurring around 10/21/2024 with redness and burning sensation, fever of around 100 degrees on 10/22/2024 per surgery note along with initial dark brown malodorous  discharge that turned yellow and copious.  Afebrile at that office visit.  Superior wound was opened and exhibiting excessive drainage, consistent with gastrointestinal fistula, bottom wound with wound manager in place which was plan to move up to the superior wound.  Plan for monitoring wound drainage volume, monitoring for any febrile episodes and repeat CT scan planned in 3 to 4 weeks.  Continued wound dressing, and further evaluation if fever returned.  Has not contacted general surgery regarding these symptoms.   Has been taking tylenol  at night.    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 02/29/2024   Hyperprolactinemia 06/23/2023   Pituitary macroadenoma (HCC) 06/23/2023   Pituitary adenoma (HCC) 04/17/2023   Pulmonary emboli (HCC) 12/08/2022   Pulmonary embolism (HCC) 12/07/2022   Thyroid  nodule 12/07/2022   Enteritis 07/23/2022   Fall at home, initial encounter 07/23/2022   AMS (altered mental status) 07/23/2022   Hemorrhoids 06/25/2022   Occasional tremors: Essential tremors 06/23/2022   Hypomagnesemia 06/22/2022   Iron deficiency anemia  Gastritis 06/18/2022   Depression 06/18/2022   UTI (urinary tract infection) 06/16/2022   DVT, bilateral lower limbs (HCC) 06/14/2022   Symptomatic bradycardia 06/13/2022   History of excision of intestinal structure 06/13/2022   CAD (coronary artery disease) 06/13/2022   Acute lower GI bleeding 05/04/2022   AKI (acute kidney injury) 05/04/2022   History of deep vein thrombosis (DVT) of lower extremity 05/04/2022    History of pulmonary embolus (PE) 05/04/2022   Anxiety 05/04/2022   Pernicious anemia 05/14/2021   Iron deficiency anemia due to chronic blood loss 05/14/2021   Abdominal wall abscess    Surgical wound infection 01/16/2021   C. 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   DVT of deep femoral vein, left (HCC) 03/19/2020   Pulmonary embolism, bilateral (HCC) 03/19/2020   Presence of IVC filter 03/19/2020   Hepatic encephalopathy (HCC) 11/22/2019   Confusion 11/21/2019   Autoimmune hepatitis (HCC) 11/21/2019   Uncontrolled hypertension 11/21/2019   DM2 (diabetes mellitus, type 2) (HCC) 11/21/2019   Closed right ankle fracture 08/27/2019   Acute deep vein thrombosis (DVT) of femoral vein of left lower extremity (HCC) 08/03/2018   History of colon cancer 08/03/2018   History of partial colectomy 08/03/2018   Spinal stenosis 08/03/2018   Morbid obesity (HCC) 08/03/2018   Cerebral meningioma (HCC) 01/05/2018   Arthritis of right shoulder region 02/18/2017   History of renal calculi 02/14/2016   Midline low back pain 02/14/2016   Class 3 obesity (HCC) 01/07/2016   Migraine with aura and without status migrainosus, not intractable 01/07/2016   Chronic fatigue syndrome 09/23/2013   GERD (gastroesophageal reflux disease) 09/23/2013   History of hepatitis A 09/23/2013   Rheumatoid arthritis involving multiple sites (HCC) 09/23/2013   Past Medical History:  Diagnosis Date   Allergy    See allergy list   Anxiety    Have had this for many years   Arthritis    Autoimmune hepatitis (HCC)    Back pain    Cancer (HCC)    pseudomyxoma peritonei, liver   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   Migraines    Multiple thyroid  nodules    Pernicious anemia 05/14/2021   Pituitary adenoma (HCC)    PONV (postoperative nausea and vomiting)    not with recent surgeries  Presence of IVC filter 03/19/2020   Pulmonary embolism, bilateral (HCC) 03/19/2020   Rectal bleed 12/29/2023   Short bowel syndrome, unspecified    Vertigo    Past Surgical History:  Procedure Laterality Date   ABDOMINAL SURGERY  2005   cytoreductive surgery with splenectomy, [facility]   ABDOMINAL SURGERY  2022   cytoreductive surgery with splenectomy, [facility]   ACHILLES TENDON REPAIR Bilateral    APPENDECTOMY  04/26/2003   ARTHROPLASTY Bilateral    Shoulder   BIOPSY THYROID      CARPAL TUNNEL RELEASE Bilateral    possibly right x2   CHOLECYSTECTOMY  07/1994   COLON SURGERY     03/2003, 12/27/2003 & 12/10/2020   CYSTOSCOPY/URETEROSCOPY/HOLMIUM LASER/STENT PLACEMENT Left 03/26/2024   Procedure: CYSTOSCOPY/URETEROSCOPY/HOLMIUM LASER/STENT PLACEMENT;  Surgeon: Elisabeth Valli BIRCH, MD;  Location: [facility];  Service: Urology;  Laterality: Left;  CYSTOSCOPY/LEFT URETEROSCOPY/HOLMIUM LASER/STENT  PLACEMENT/RETROGRADE PYELOGRAM   EYE SURGERY     FRACTURE SURGERY  08/23/2019   Rt ankle   IR CATHETER TUBE CHANGE  02/02/2021   IR CATHETER TUBE CHANGE  02/25/2021   IR IMAGING GUIDED PORT INSERTION  06/20/2022   IR RADIOLOGIST EVAL & MGMT  02/24/2021   IR RADIOLOGIST EVAL & MGMT  03/10/2021   IR RADIOLOGIST EVAL & MGMT  06/22/2022   IR THROMBECT VENO MECH MOD SED  06/20/2022   IR US  GUIDE BX ASP/DRAIN  11/25/2019   IR US  GUIDE VASC ACCESS LEFT  06/20/2022   IR US  GUIDE VASC ACCESS RIGHT  06/20/2022   IR US  GUIDE VASC ACCESS RIGHT  06/20/2022   IR VENO/EXT/BI  06/20/2022   IR VENOCAVAGRAM IVC  06/20/2022   JOINT REPLACEMENT Bilateral    knees   KNEE ARTHROSCOPY     ORIF ANKLE FRACTURE Right 08/27/2019   Procedure: OPEN REDUCTION INTERNAL FIXATION RIGHT ANKLE FRACTURE;  Surgeon: Beverley Evalene BIRCH, MD;  Location: [facility];  Service: Orthopedics;  Laterality: Right;   perineorrophy     SMALL INTESTINE SURGERY     03/2004, 12/27/2003 & 12/10/2020   SPINAL FUSION     TARSAL TUNNEL RELEASE Right    TONGUE BIOPSY     TOTAL ABDOMINAL HYSTERECTOMY Bilateral 2004   with BSO and appendectomy   Allergies[1] Prior to Admission medications  Medication Sig Start Date End Date Taking? Authorizing Provider  acetaminophen  (TYLENOL ) 500 MG tablet Take 1,000 mg by mouth every 6 (six) hours as needed for moderate pain (pain score 4-6).   Yes [provider]  amLODipine  (NORVASC ) 5 MG tablet TAKE 1 TABLET(5 MG) BY MOUTH DAILY 06/06/24  Yes [name], MD  brexpiprazole (REXULTI) 1 MG TABS tablet Take 1 mg by mouth daily.   Yes [provider]  busPIRone  (BUSPAR ) 15 MG tablet Take 1 tablet (15 mg total) by mouth 3 (three) times daily. 07/09/24  Yes White, Redell A, NP  busPIRone  (BUSPAR ) 30 MG tablet Take 1 tablet (30 mg total) by mouth 2 (two) times daily. 10/30/24  Yes White, Redell LABOR, NP  citalopram  (CELEXA ) 40 MG tablet TAKE 1 TABLET(40 MG) BY MOUTH DAILY 10/02/24  Yes White,  Brian A, NP  Continuous Glucose Sensor (DEXCOM G7 SENSOR) MISC 1 Device by Does not apply route as directed. 05/20/24  Yes [name], Ibtehal Jaralla, MD  cyclobenzaprine  (FLEXERIL ) 10 MG tablet TAKE 1/2 TO 1 TABLET(5 TO 10 MG) BY MOUTH THREE TIMES DAILY AS NEEDED FOR MUSCLE SPASMS. START EVERY NIGHT AT BEDTIME AS NEEDED DUE TO SEDATION 08/15/24  Yes Levora,  Reyes SAUNDERS, MD  diazepam  (VALIUM ) 10 MG tablet Take one pill as needed for procedures including MRI 08/07/24  Yes Maritza Stagger, MD  diphenoxylate -atropine  (LOMOTIL ) 2.5-0.025 MG tablet TAKE 2 TABLETS BY MOUTH FOUR TIMES DAILY AS NEEDED FOR LOOSE STOOLS OR DIARRHEA 06/04/24  Yes Ennever, Maude SAUNDERS, MD  estradiol (ESTRACE) 0.01 % CREA vaginal cream Place 1 Applicatorful vaginally every 14 (fourteen) days. 06/05/24  Yes [provider]  eszopiclone  (LUNESTA ) 2 MG TABS tablet TAKE 1 TABLET(2 MG) BY MOUTH AT BEDTIME AS NEEDED FOR SLEEP 07/09/24  Yes White, Brian A, NP  famotidine  (PEPCID ) 20 MG tablet TAKE 1 TABLET(20 MG) BY MOUTH TWICE DAILY 08/02/24  Yes [name], MD  fondaparinux  (ARIXTRA ) 10 MG/0.8ML SOLN injection Inject 0.8 mLs (10 mg total) into the skin daily. 08/12/24  Yes Timmy Maude SAUNDERS, MD  fondaparinux  (ARIXTRA ) 10 MG/0.8ML SOLN injection Inject 0.8 mLs (10 mg total) into the skin daily. 08/12/24  Yes Timmy Maude SAUNDERS, MD  hydrocortisone  (ANUSOL -HC) 2.5 % rectal cream PLACE RECTALLY 4 TIMES DAILY AS NEEDED FOR HEMORRHOIDS OR ANAL ITCHING 08/15/24  Yes Ennever, Maude SAUNDERS, MD  HYDROmorphone  (DILAUDID ) 4 MG tablet Take 1 tablet (4 mg total) by mouth every 6 (six) hours as needed for severe pain (pain score 7-10). 11/07/24  Yes Timmy Maude SAUNDERS, MD  insulin  aspart (NOVOLOG  FLEXPEN) 100 UNIT/ML FlexPen Max daily 60 units 08/13/24  Yes [name], Ibtehal Jaralla, MD  insulin  glargine (LANTUS  SOLOSTAR) 100 UNIT/ML Solostar Pen Inject 40 Units into the skin daily. 08/13/24  Yes [name], Ibtehal Jaralla, MD  Insulin  Pen Needle  (EMBECTA PEN NEEDLE NANO 2 GEN) 32G X 4 MM MISC Use to check blood glucose  times a day. 08/15/24  Yes [name], Ibtehal Jaralla, MD  Insulin  Pen Needle 32G X 4 MM MISC 1 Device by Does not apply route in the morning, at noon, in the evening, and at bedtime. 08/13/24  Yes [name], Ibtehal Jaralla, MD  lidocaine -hydrocortisone  (ANAMANTLE) 3-1 % KIT Place 1 Application rectally 2 (two) times daily. 12/29/23  Yes Elnor Savant A, DO  loperamide  (IMODIUM ) 2 MG capsule Take 2 capsules (4 mg total) by mouth 4 (four) times daily as needed for diarrhea or loose stools. 05/08/22  Yes Cheryle Page, MD  meclizine  (ANTIVERT ) 25 MG tablet TAKE 1 TABLET(25 MG) BY MOUTH THREE TIMES DAILY AS NEEDED FOR DIZZINESS 10/19/24  Yes Ennever, Maude SAUNDERS, MD  mirtazapine  (REMERON ) 15 MG tablet Take 1 tablet (15 mg total) by mouth at bedtime. 07/09/24  Yes White, Redell A, NP  ondansetron  (ZOFRAN ) 8 MG tablet TAKE 1 TABLET(8 MG) BY MOUTH EVERY 8 HOURS AS NEEDED FOR NAUSEA OR VOMITING 03/28/24  Yes Ennever, Maude SAUNDERS, MD  orphenadrine  (NORFLEX ) 100 MG tablet TAKE 1 TABLET(100 MG) BY MOUTH AT BEDTIME AS NEEDED FOR MUSCLE SPASMS 08/02/24  Yes Ennever, Maude SAUNDERS, MD  Ostomy Supplies MISC 20 each by Other route. 09/17/24 12/16/24 Yes [provider]  pantoprazole  (PROTONIX ) 40 MG tablet Take 1 tablet (40 mg total) by mouth daily. 10/04/24  Yes [name], MD  pramoxine-hydrocortisone  (PROCTOCREAM-HC) 1-1 % rectal cream APPLY RECTALLY TO THE AFFECTED AREA TWICE DAILY 10/24/23  Yes Ennever, Maude SAUNDERS, MD  promethazine  (PHENERGAN ) 12.5 MG tablet TAKE 1 TABLET(12.5 MG) BY MOUTH EVERY 6 HOURS AS NEEDED FOR NAUSEA OR VOMITING 07/20/24  Yes Ennever, Maude SAUNDERS, MD  propranolol  (INDERAL ) 20 MG tablet Take 1 tablet (20 mg total) by mouth 2 (two) times daily. 07/09/24  Yes White,  Redell LABOR, NP  terconazole  (TERAZOL 7 ) 0.4 % vaginal cream Apply topically twice daily for up to 7 days 03/06/24  Yes Cleotilde Ronal RAMAN, MD   Social History    Socioeconomic History   Marital status: Married    Spouse name: Not on file   Number of children: 3   Years of education: Not on file   Highest education level: 12th grade  Occupational History   Occupation: disability  Tobacco Use   Smoking status: Never   Smokeless tobacco: Never  Vaping Use   Vaping status: Never Used  Substance and Sexual Activity   Alcohol use: Never   Drug use: Never   Sexual activity: Not Currently    Birth control/protection: Post-menopausal, Surgical    Comment: Husband had vasectomy  Other Topics Concern   Not on file  Social History Narrative   Right handed   One story home   Drinks occasional caffeine   Social Drivers of Health   Tobacco Use: Low Risk (11/08/2024)   Patient History    Smoking Tobacco Use: Never    Smokeless Tobacco Use: Never    Passive Exposure: Not on file  Financial Resource Strain: Low Risk (11/07/2024)   Overall Financial Resource Strain (CARDIA)    Difficulty of Paying Living Expenses: Not hard at all  Food Insecurity: No Food Insecurity (11/07/2024)   Epic    Worried About Radiation Protection Practitioner of Food in the Last Year: Never true    Ran Out of Food in the Last Year: Never true  Transportation Needs: No Transportation Needs (11/07/2024)   Epic    Lack of Transportation (Medical): No    Lack of Transportation (Non-Medical): No  Physical Activity: Inactive (11/07/2024)   Exercise Vital Sign    Days of Exercise per Week: 0 days    Minutes of Exercise per Session: Not on file  Stress: Stress Concern Present (11/07/2024)   [employer] of Occupational Health - Occupational Stress Questionnaire    Feeling of Stress: Very much  Social Connections: Moderately Integrated (11/07/2024)   Social Connection and Isolation Panel    Frequency of Communication with Friends and Family: More than three times a week    Frequency of Social Gatherings with Friends and Family: Twice a week    Attends Religious Services: More than 4 times  per year    Active Member of [employer] or Organizations: No    Attends Banker Meetings: Not on file    Marital Status: Married  Intimate Partner Violence: Not At Risk (09/18/2024)   Epic    Fear of Current or Ex-Partner: No    Emotionally Abused: No    Physically Abused: No    Sexually Abused: No  Depression (PHQ2-9): Medium Risk (10/04/2024)   Depression (PHQ2-9)    PHQ-2 Score: 8  Alcohol Screen: Low Risk (03/09/2023)   Alcohol Screen    Last Alcohol Screening Score (AUDIT): 0  Housing: Low Risk (11/07/2024)   Epic    Unable to Pay for Housing in the Last Year: No    Number of Times Moved in the Last Year: 0    Homeless in the Last Year: No  Utilities: Not At Risk (09/18/2024)   Epic    Threatened with loss of utilities: No  Health Literacy: Not on file    Review of Systems Per HPI.   Objective:   Vitals:   11/08/24 1155  BP: 102/64  Pulse: 80  Resp: (!) 22  Temp: 98.4 F (  36.9 C)  TempSrc: Temporal  SpO2: 95%  Weight: 231 lb (104.8 kg)  Height: 5' 6 (1.676 m)     Physical Exam Vitals reviewed.  Constitutional:      Appearance: Normal appearance. She is well-developed. She is ill-appearing (appears uncomfortable, nontoxic.).  HENT:     Head: Normocephalic and atraumatic.     Right Ear: Tympanic membrane, ear canal and external ear normal.     Ears:     Comments: Left tm with slight injection. Canal clear.  Eyes:     Conjunctiva/sclera: Conjunctivae normal.     Pupils: Pupils are equal, round, and reactive to light.  Neck:     Vascular: No carotid bruit.  Cardiovascular:     Rate and Rhythm: Normal rate and regular rhythm.     Heart sounds: Normal heart sounds.  Pulmonary:     Effort: Pulmonary effort is normal.     Breath sounds: Normal breath sounds.  Abdominal:     Palpations: Abdomen is soft. There is no pulsatile mass.     Tenderness: There is abdominal tenderness.     Comments: See photo.  Ttp midline abdomen with induration in  area of erythema below ostomy bag attachment. Min suprapubic ttp.   Musculoskeletal:     Right lower leg: No edema.     Left lower leg: No edema.  Skin:    General: Skin is warm and dry.  Neurological:     Mental Status: She is alert and oriented to person, place, and time.  Psychiatric:        Mood and Affect: Mood normal.        Behavior: Behavior normal.    Results for orders placed or performed in visit on 11/08/24  POCT urinalysis dipstick   Collection Time: 11/08/24 12:15 PM  Result Value Ref Range   Color, UA Orange    Clarity, UA Clear    Glucose, UA Negative Negative   Bilirubin, UA 1 (A)    Ketones, UA Negative    Spec Grav, UA >=1.030 (A) 1.010 - 1.025   Blood, UA 200 (A)    pH, UA 6.0 5.0 - 8.0   Protein, UA Positive (A) Negative   Urobilinogen, UA 0.2 0.2 or 1.0 E.U./dL   Nitrite, UA Negative    Leukocytes, UA Trace (A) Negative   Appearance abnormal    Odor Positive    *Note: Due to a large number of results and/or encounters for the requested time period, some results have not been displayed. A complete set of results can be found in Results Review.        Assessment & Plan:  Wendy Barber is a 64 y.o. female . Abdominal wall pain Enterocutaneous fistula Fever, unspecified fever cause Hyperglycemia  - Fever past few days with abdominal pain, erythema, induration of midline concerning for abscess formation versus fistula, new wound.  Skin intact at this time and affected area with drainage superiorly.  Recent hyperglycemia with minimal p.o. intake concerning for underlying infection.  Recommended discussion with her surgery team, but likely will need CT imaging, further evaluation for abscess versus fistula.  Plan on ER eval through [facility] since that is location of surgical team but she will contact surgeon first.   Urinary frequency - Plan: POCT urinalysis dipstick Hematuria, unspecified type  - Some improvement with self treatment with  nitrofurantoin .  Initial pain into the vagina, now improving.  In office urinalysis with with trace LE but negative nitrate.  Blood  noted.  Will hold on antibiotics for now, check culture.  Potentially may be treated with antibiotics for above abdominal wound, possible abscess.  BRBPR (bright red blood per rectum) Dark stools  - New concern, history of hemorrhoids.  Hemorrhoids may explain the bright red blood but with clots, question other cause of lower gastrointestinal bleeding.  ER evaluation as above, general surgery discussion as well, likely will need further imaging and evaluation.  Given plan for ER evaluation, will hold on blood work at this time.  Left ear pain  - Slight injection noted at TM without definitive signs of acute otitis media at this time.  Will need to monitor closely for progression, hold on antibiotics for now for reasons above, close monitoring next few days.  I personally spent a total of 61 minutes in the care of the patient today including preparing to see the patient, getting/reviewing separately obtained history, performing a medically appropriate exam/evaluation, counseling and educating, documenting clinical information in the EHR, and independently interpreting results.     No orders of the defined types were placed in this encounter.  Patient Instructions  I am sorry to hear you are not feeling well again.  As we discussed I am concerned about the area on your abdominal wall being a possible abscess if not new fistula forming or wound.  With the redness, pain and fever I am concerned more of an infection/abscess.  I suspect you will need to have a CT scan or further imaging of that area to evaluate it further.  You can certainly reach out to your surgeons office to see if they are able to evaluate you today, but if not I think the emergency room at [facility] may be best for further testing and then treatment options.  We also need to be aware of the bright red blood  and blood clots that you have passed in the stool as well as the blood in the urine.   I do not see a definite infection in the left ear at this time but 1 could be forming.  Depending on treatment for abdominal issues above, may need to look at that ear in the next couple days if not improving, sooner if worse.  I did not start antibiotic as they potentially may start antibiotics for your abdominal issues.  I will check a urine culture but again did not start any antibiotics as treatment for your abdominal issues may cover for a possible bladder infection.  I will keep an eye out on the notes to determine what they find, but let me know if I can help further and hang in there.    Signed,   Reyes Pines, MD [facility], [facility] 11/08/24 1:32 PM       [1]  Allergies Allergen Reactions   Ativan [Lorazepam] Swelling and Other (See Comments)    Face & Throat Swelling  Note: tolerates midazolam  fine   Corticosteroids Other (See Comments)    Psychotic behavior    Penicillins Shortness Of Breath and Other (See Comments)    Irregular and rapid Heart Rate, too    Alprazolam Hives and Other (See Comments)    Hard to arouse, unresponsiveness also   Erythromycin Nausea And Vomiting        Gabapentin  Other (See Comments)    Made the patient feel depressed   Other    Prednisolone Anxiety   Prednisone Anxiety and Other (See Comments)    Anxiety &  Nervous Breakdown   Savella  [Milnacipran] Other (See Comments)    Reaction not noted   "

## 2024-11-08 NOTE — Patient Instructions (Addendum)
 I am sorry to hear you are not feeling well again.  As we discussed I am concerned about the area on your abdominal wall being a possible abscess if not new fistula forming or wound.  With the redness, pain and fever I am concerned more of an infection/abscess.  I suspect you will need to have a CT scan or further imaging of that area to evaluate it further.  You can certainly reach out to your surgeons office to see if they are able to evaluate you today, but if not I think the emergency room at [facility] may be best for further testing and then treatment options.  They also need to be aware of the bright red blood and blood clots that you have passed in the stool as well as the blood in the urine.   I do not see a definite infection in the left ear at this time but 1 could be forming.  Depending on treatment for abdominal issues above, may need to look at that ear in the next couple days if not improving, sooner if worse.  I did not start antibiotic as they potentially may start antibiotics for your abdominal issues.  I will check a urine culture but again did not start any antibiotics as treatment for your abdominal issues may cover for a possible bladder infection.  I will keep an eye out on the notes to determine what they find, but let me know if I can help further and hang in there.

## 2024-11-09 LAB — URINE CULTURE
MICRO NUMBER:: 17479544
SPECIMEN QUALITY:: ADEQUATE

## 2024-11-10 ENCOUNTER — Ambulatory Visit: Payer: Self-pay | Admitting: Family Medicine

## 2024-11-12 ENCOUNTER — Other Ambulatory Visit: Payer: Self-pay | Admitting: Hematology & Oncology

## 2024-11-12 ENCOUNTER — Other Ambulatory Visit: Payer: Self-pay | Admitting: Behavioral Health

## 2024-11-12 DIAGNOSIS — F99 Mental disorder, not otherwise specified: Secondary | ICD-10-CM

## 2024-11-13 ENCOUNTER — Other Ambulatory Visit: Payer: Self-pay | Admitting: Family Medicine

## 2024-11-13 ENCOUNTER — Ambulatory Visit: Admitting: Behavioral Health

## 2024-11-13 DIAGNOSIS — H8112 Benign paroxysmal vertigo, left ear: Secondary | ICD-10-CM

## 2024-11-14 ENCOUNTER — Telehealth: Payer: Self-pay | Admitting: *Deleted
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
## 2024-11-15 ENCOUNTER — Inpatient Hospital Stay

## 2024-11-15 ENCOUNTER — Inpatient Hospital Stay: Admitting: Hematology & Oncology

## 2024-11-17 ENCOUNTER — Other Ambulatory Visit: Payer: Self-pay | Admitting: Hematology & Oncology

## 2024-11-17 DIAGNOSIS — R110 Nausea: Secondary | ICD-10-CM

## 2024-11-18 ENCOUNTER — Encounter: Payer: Self-pay | Admitting: Hematology & Oncology

## 2024-11-20 ENCOUNTER — Inpatient Hospital Stay

## 2024-11-20 ENCOUNTER — Inpatient Hospital Stay: Admitting: Hematology & Oncology

## 2024-11-21 ENCOUNTER — Telehealth: Payer: Self-pay | Admitting: Family Medicine

## 2024-11-21 NOTE — Telephone Encounter (Signed)
 Obtained and placed in providers folder at nurse station.

## 2024-11-21 NOTE — Telephone Encounter (Signed)
 Paperwork completed and placed in fax bin at back nurse station

## 2024-11-21 NOTE — Telephone Encounter (Signed)
 Type of form received: [facility]  Additional comments:   Received by: Fax  Form should be Faxed/mailed to: (address/ fax #) [phone]  Is patient requesting call for pickup:  Form placed:  Provider bin  Attach charge sheet.  Provider will determine charge.  Individual made aware of 3-5 business day turn around Yes?

## 2024-11-22 NOTE — Telephone Encounter (Signed)
 Forms were faxed by [facility]

## 2024-11-27 ENCOUNTER — Telehealth: Payer: Self-pay | Admitting: *Deleted

## 2024-11-27 ENCOUNTER — Telehealth: Admitting: Behavioral Health

## 2024-11-27 ENCOUNTER — Ambulatory Visit: Payer: Self-pay

## 2024-11-27 NOTE — Telephone Encounter (Signed)
 FYI for PCP patient advised to proceed to ED

## 2024-11-27 NOTE — Telephone Encounter (Signed)
" °  Unable to hear PAS after opening nurse triage. PAS states they cannot hear RN.      Message from [place] J sent at 11/27/2024 10:40 AM EST  Reason for Triage: Patient is having increased weakness over the last couple of days, she has had 2 episodes where she's almost passed out. Patient is orthostatic.   "

## 2024-11-27 NOTE — Telephone Encounter (Signed)
 FYI Only or Action Required?: FYI only for provider: ED advised.  Patient was last seen in primary care on 11/08/2024 by [name], MD.  Called Nurse Triage reporting Hypotension.  Symptoms began several days ago.  Interventions attempted: Prescription medications: amlodipine  .  Symptoms are: gradually worsening.  Triage Disposition: Go to ED Now (or PCP Triage)  Patient/caregiver understands and will follow disposition?:   Reason for Disposition  [1] Fall in systolic BP > 20 mm Hg from normal AND [2] feeling weak or lightheaded  Answer Assessment - Initial Assessment Questions Wendy Barber with [facility] home health calling to report the pt is having orthostatic blood pressure readings. Orthostatic from sitting to standing 110/74, standing 88/66. Pt has had increased weakness over the last two days, increased SOB when moving around.  Wendy Barber rechecked pt's blood pressure while pt was sitting: 104/78  1. BLOOD PRESSURE: What is your blood pressure? Did you take at least two measurements 5 minutes apart?     110/74, standing 88/66 2. ONSET: When did you take your blood pressure?     11/27/24 3. HOW: How did you take your blood pressure? (e.g., visiting nurse, automatic home BP monitor)     Home health nurse 4. HISTORY: Do you have a history of low blood pressure? What is your blood pressure normally?     No  5. MEDICINES: Are you taking any medicines for blood pressure? If Yes, ask: Have they been changed recently?     Yes - amlodipine ,  6. PULSE RATE: Do you know what your pulse rate is?      N/a 7. OTHER SYMPTOMS: Have you been sick recently? Have you had a recent injury?     Hospitalizes two weeks ago for abdominal abcess  Protocols used: Blood Pressure - Low-A-AH  Reason for Triage: Patient is having increased weakness over the last couple of days, she has had 2 episodes where she's almost passed out. Patient is orthostatic.

## 2024-11-27 NOTE — Telephone Encounter (Signed)
 Call received from patient and patient's home care nurse Almarie from [place] stating that pt has been experiencing orthostatic hypotension with increased diarrhea and was told to go to the ER by her PCP, but would like to know if she can come in to this office for lab work and IVF's.  Elizabeth instructed per order of Dr. Timmy to have pt go to the ER as instructed by Dr. Landy. Almarie states that she will inform pt of Dr. Jessy orders.

## 2024-11-27 NOTE — Telephone Encounter (Signed)
 Agree with ED eval as symptomatic - may need labs, IVF.

## 2024-11-27 NOTE — Telephone Encounter (Signed)
 Called patient and she has not gone to ED. I told her that Dr. Levora agreed that she needs to go. She said she is still undecided and will let us  know if she decides to go

## 2024-11-29 ENCOUNTER — Other Ambulatory Visit: Payer: Self-pay | Admitting: Radiation Oncology

## 2024-11-29 ENCOUNTER — Telehealth: Payer: Self-pay | Admitting: Radiation Oncology

## 2024-11-29 MED ORDER — DIAZEPAM 10 MG PO TABS: ORAL_TABLET | ORAL | 0 refills | Status: AC

## 2024-11-29 NOTE — Telephone Encounter (Signed)
 Pt called to advise she received a call from Radiology needing to move her appt. Appt moved to 2/16; pt reached out to us  to r/s f/u with Dr. Maritza to review these results.Appt moved to 2/23@3 :30pm.   Pt requesting for medication refill for diazepam  to help with anxiety for upcoming imaging appt. I advised I would send message to see if Dr. Maritza is able to do this before her appt 2/16. Pt anticipating call next week from nurse once Dr. Maritza provides instruction.

## 2024-12-03 ENCOUNTER — Other Ambulatory Visit

## 2024-12-09 ENCOUNTER — Ambulatory Visit: Admitting: Radiation Oncology

## 2024-12-09 ENCOUNTER — Inpatient Hospital Stay: Attending: Hematology & Oncology

## 2024-12-09 ENCOUNTER — Other Ambulatory Visit

## 2024-12-12 ENCOUNTER — Inpatient Hospital Stay

## 2024-12-12 ENCOUNTER — Inpatient Hospital Stay: Admitting: Hematology & Oncology

## 2024-12-16 ENCOUNTER — Ambulatory Visit: Admitting: Radiation Oncology

## 2024-12-27 ENCOUNTER — Inpatient Hospital Stay

## 2024-12-27 ENCOUNTER — Inpatient Hospital Stay: Attending: Hematology & Oncology

## 2024-12-27 ENCOUNTER — Inpatient Hospital Stay: Admitting: Hematology & Oncology

## 2025-02-26 ENCOUNTER — Ambulatory Visit: Admitting: Internal Medicine

## 2025-03-31 ENCOUNTER — Ambulatory Visit: Admitting: Family Medicine

## 2025-07-08 ENCOUNTER — Encounter (INDEPENDENT_AMBULATORY_CARE_PROVIDER_SITE_OTHER): Payer: Self-pay | Admitting: Ophthalmology
